# Patient Record
Sex: Male | Born: 1960 | Race: Black or African American | Hispanic: No | Marital: Single | Smoking: Current every day smoker
Health system: Southern US, Community
[De-identification: ages and names within clinical notes are randomized; demographics above are authoritative.]

## PROBLEM LIST (undated history)

## (undated) DIAGNOSIS — K5792 Diverticulitis of intestine, part unspecified, without perforation or abscess without bleeding: Secondary | ICD-10-CM

## (undated) DIAGNOSIS — J189 Pneumonia, unspecified organism: Secondary | ICD-10-CM

## (undated) DIAGNOSIS — I509 Heart failure, unspecified: Secondary | ICD-10-CM

## (undated) DIAGNOSIS — Z992 Dependence on renal dialysis: Secondary | ICD-10-CM

## (undated) DIAGNOSIS — D638 Anemia in other chronic diseases classified elsewhere: Secondary | ICD-10-CM

## (undated) DIAGNOSIS — N289 Disorder of kidney and ureter, unspecified: Secondary | ICD-10-CM

## (undated) DIAGNOSIS — F1910 Other psychoactive substance abuse, uncomplicated: Secondary | ICD-10-CM

## (undated) DIAGNOSIS — Z9289 Personal history of other medical treatment: Secondary | ICD-10-CM

## (undated) DIAGNOSIS — I10 Essential (primary) hypertension: Secondary | ICD-10-CM

## (undated) DIAGNOSIS — N186 End stage renal disease: Secondary | ICD-10-CM

## (undated) DIAGNOSIS — G479 Sleep disorder, unspecified: Secondary | ICD-10-CM

## (undated) DIAGNOSIS — N2581 Secondary hyperparathyroidism of renal origin: Secondary | ICD-10-CM

## (undated) DIAGNOSIS — Z720 Tobacco use: Secondary | ICD-10-CM

## (undated) HISTORY — PX: APPENDECTOMY: SHX54

## (undated) HISTORY — DX: Sleep disorder, unspecified: G47.9

## (undated) HISTORY — PX: OTHER SURGICAL HISTORY: SHX169

---

## 2006-06-11 ENCOUNTER — Emergency Department: Payer: Self-pay | Admitting: Emergency Medicine

## 2006-06-11 ENCOUNTER — Other Ambulatory Visit: Payer: Self-pay

## 2007-10-18 IMAGING — CR DG CHEST 1V
1 series · 1 of 1 positions shown · non-contrast
Comparison: none

REASON FOR EXAM: rm 10  abdominal  pain
COMMENTS:

[view not recorded]
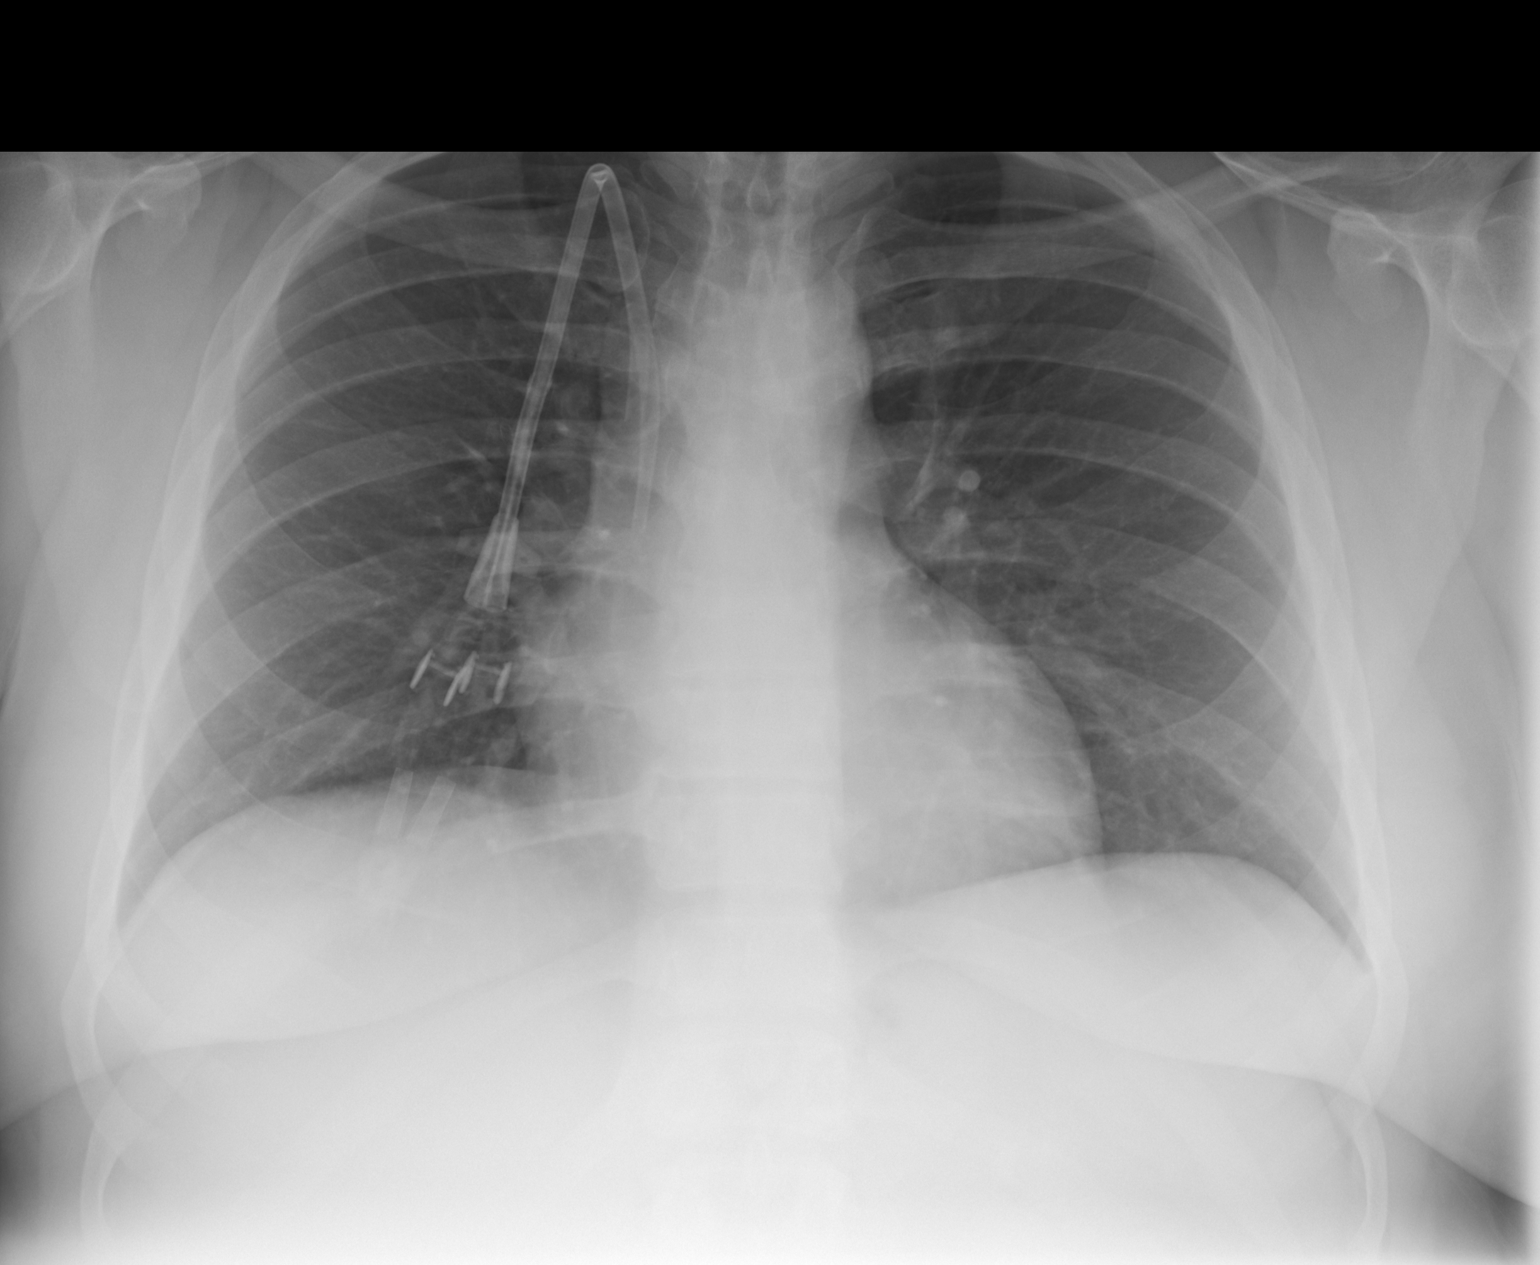

[1 of 1 positions shown; findings below may reference images not displayed]

PROCEDURE:     DXR - DXR CHEST 1 VIEWAP OR PA  - June 11, 2006  [DATE]

RESULT:     A venous catheter is present with the tip projected near the
junction of the superior vena cava and RIGHT atrium.  The lung fields are
clear. The heart, mediastinal and osseous structures show no significant
abnormalities.
IMPRESSION: 1)No acute changes are identified.

## 2007-10-18 IMAGING — CR DG ABDOMEN 1V
1 series · 2 of 2 positions shown · non-contrast
Comparison: none

REASON FOR EXAM: Abdominal pain
COMMENTS:

[Series 1: view not recorded · 0.17mm/px · 2 of 2 slices shown]
[im 1/2]
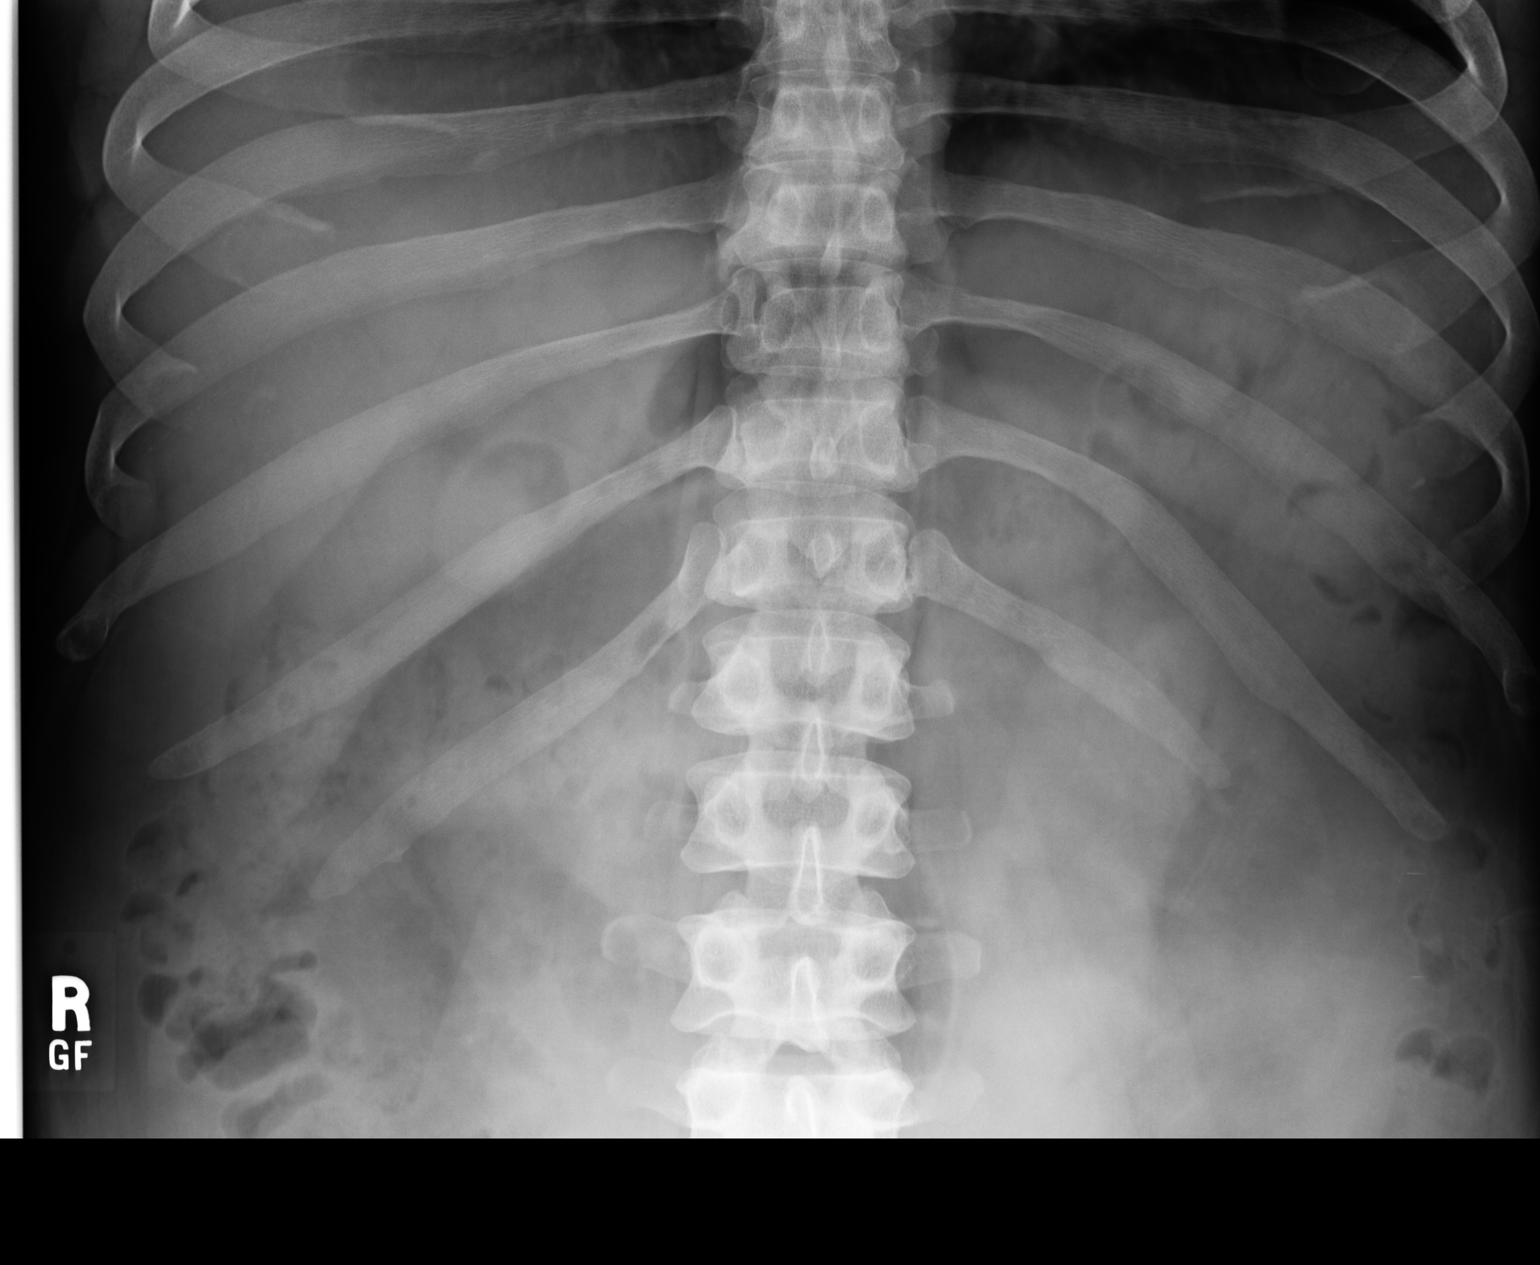
[im 2/2]
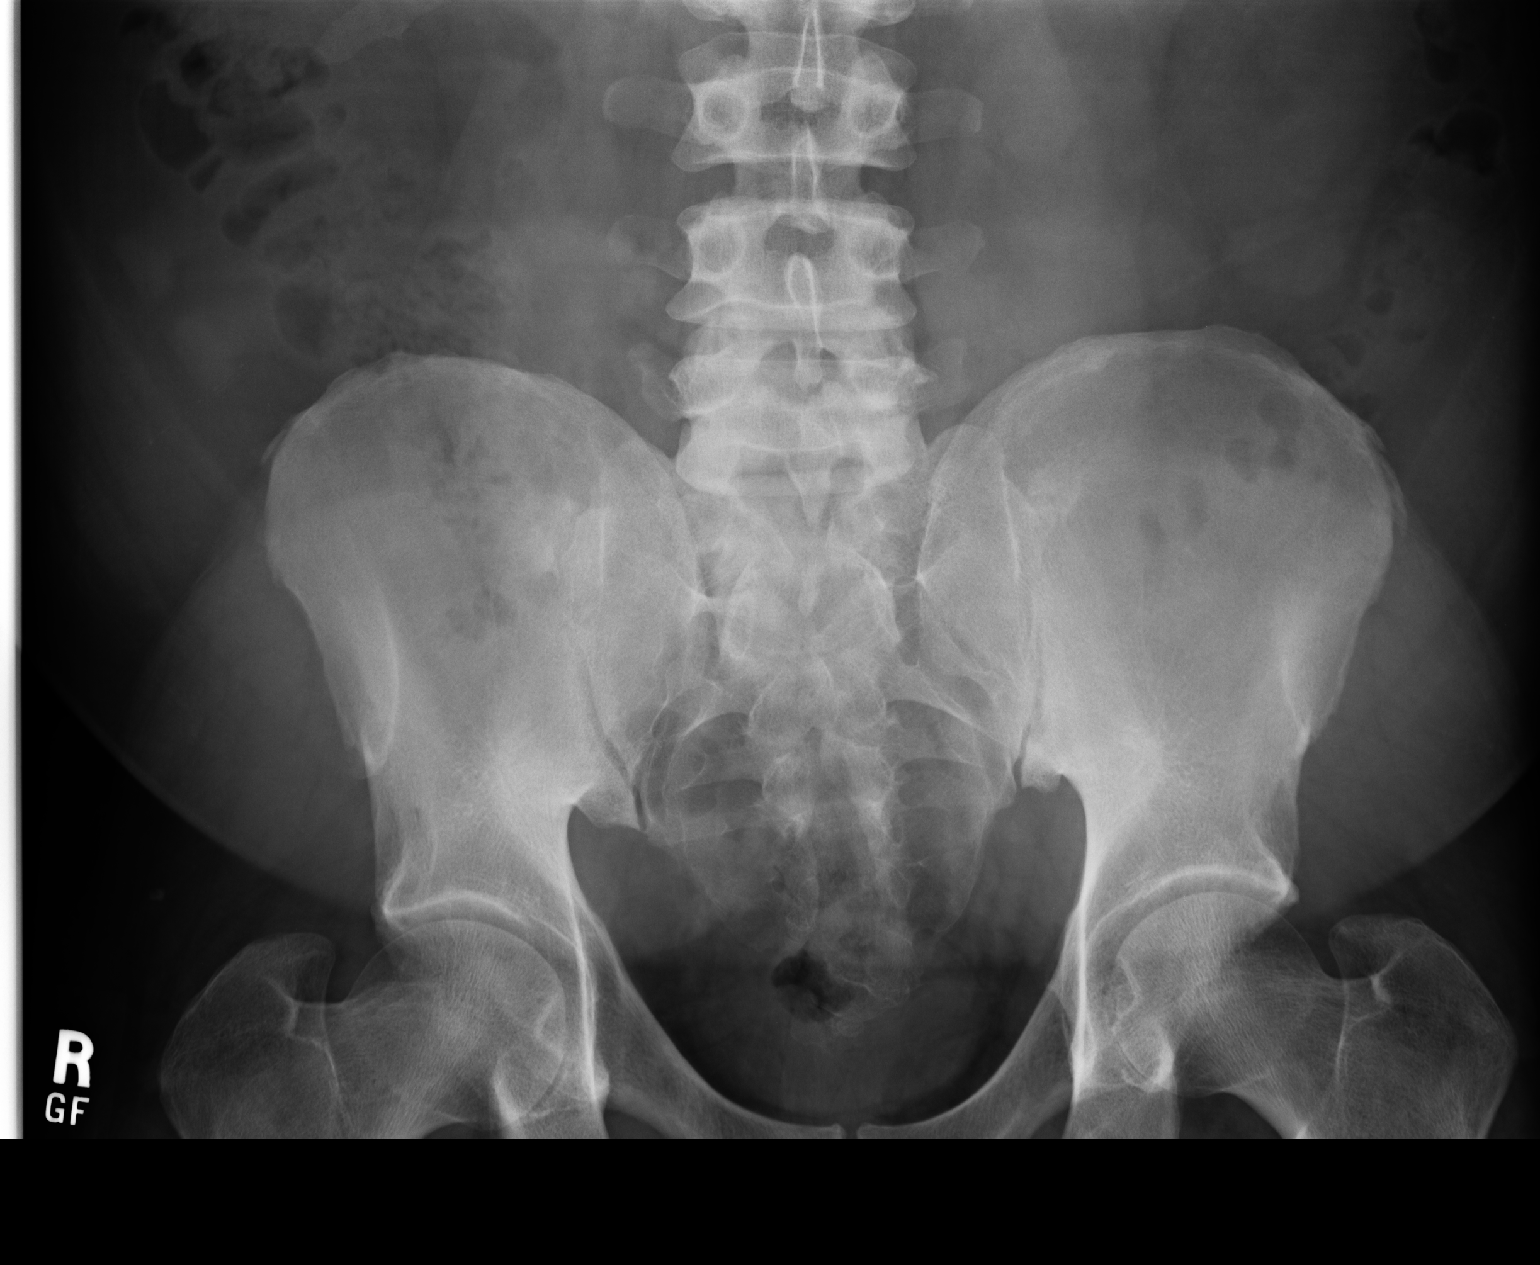

[2 of 2 positions shown; findings below may reference images not displayed]

PROCEDURE:     DXR - DXR KIDNEY URETER BLADDER  - June 11, 2006  [DATE]

RESULT:       AP view of the abdomen shows no specific abnormalities of the
bowel gas pattern.  No evidence for bowel obstruction is seen. There is
noted a moderate amount of fecal material in the colon.  No abnormal
intra-abdominal calcifications are seen.  The osseous structures are normal
in appearance.
IMPRESSION: 1.     No specific abnormalities are identified.
2.     There is a moderate amount of fecal material in the colon.

## 2008-08-22 ENCOUNTER — Other Ambulatory Visit: Payer: Self-pay

## 2008-08-22 ENCOUNTER — Emergency Department: Payer: Self-pay | Admitting: Emergency Medicine

## 2008-08-25 ENCOUNTER — Other Ambulatory Visit: Payer: Self-pay

## 2008-08-25 ENCOUNTER — Inpatient Hospital Stay: Payer: Self-pay | Admitting: Internal Medicine

## 2009-03-19 ENCOUNTER — Emergency Department: Payer: Self-pay | Admitting: Emergency Medicine

## 2009-10-20 ENCOUNTER — Emergency Department: Payer: Self-pay | Admitting: Emergency Medicine

## 2009-10-20 ENCOUNTER — Inpatient Hospital Stay: Payer: Self-pay | Admitting: Internal Medicine

## 2010-01-01 IMAGING — CT CT ABD-PELV W/O CM
1 of 2 series · 15 of 32 positions shown, 19 images · non-contrast
Comparison: none

REASON FOR EXAM: umbilical pain, hernia, dialysis pt
COMMENTS:

PROCEDURE:     CT  - CT ABDOMEN AND PELVIS W[DATE] [DATE]
RESULT:     Indication: Renal stone.
TECHNIQUE: A standard renal stone protocol was performed with sequential 5
mm noncontrasted axial images from the lung bases to the symphysis pubis
with the patient in a supine position.

[Series 2: soft tissue · axial · 0.75mm/px · z∈[-49,+416]mm · 15 of 103 slices shown, 19 images]
[im 5/103  soft-tissue]
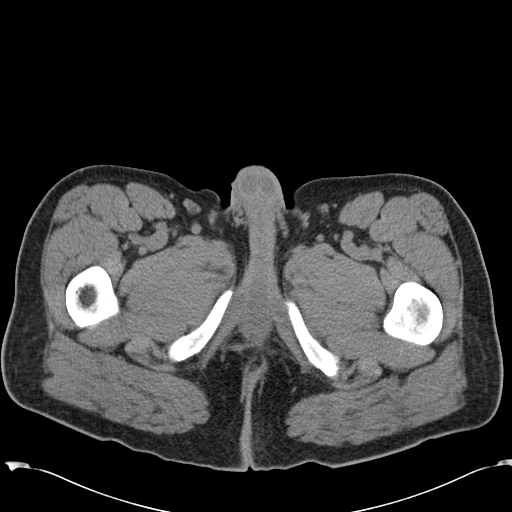
[im 5/103  bone]
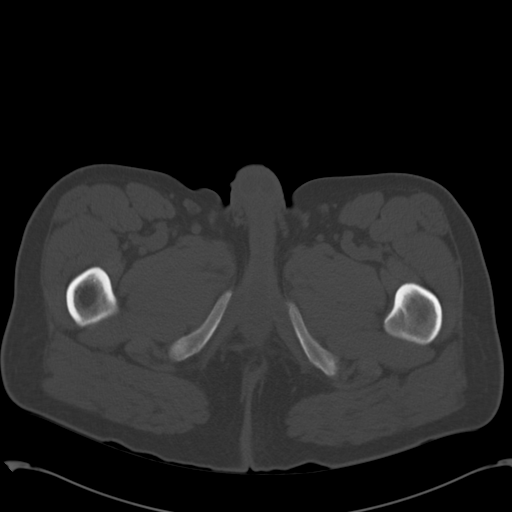
[im 13/103  soft-tissue]
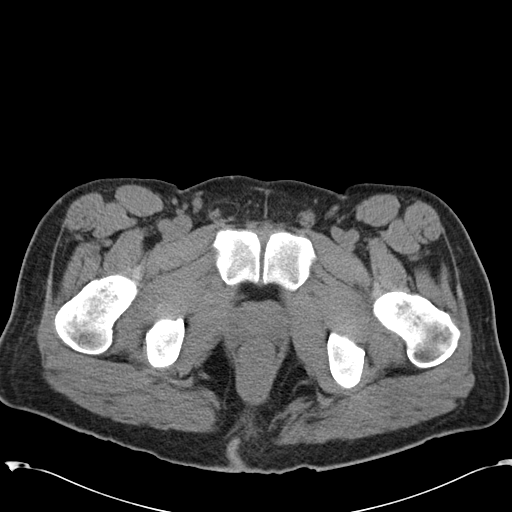
[im 21/103  soft-tissue]
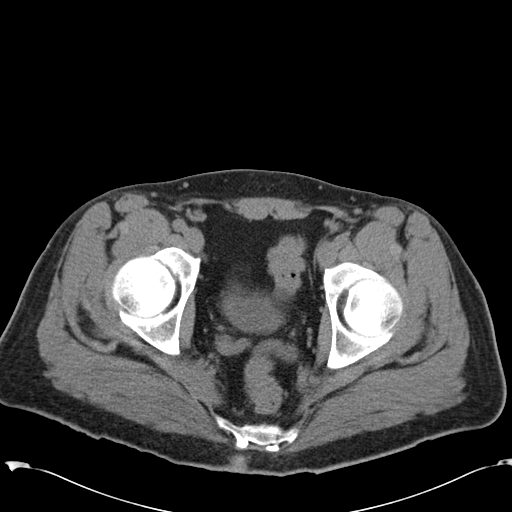
[im 29/103  soft-tissue]
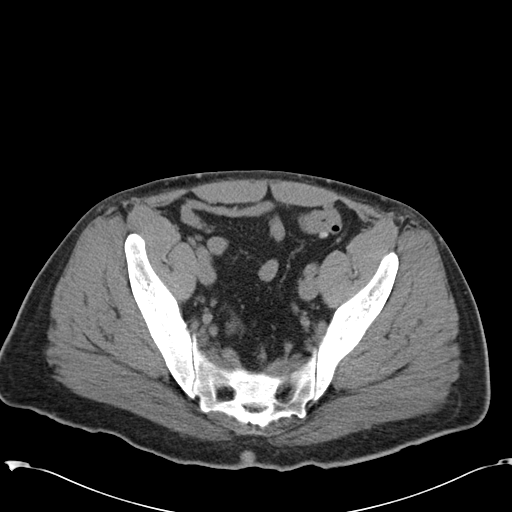
[im 37/103  soft-tissue]
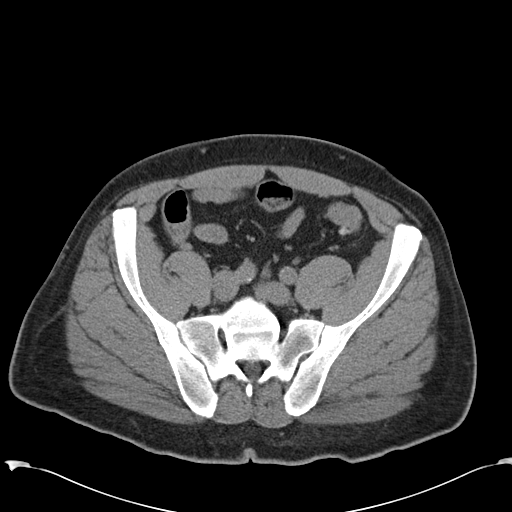
[im 45/103  soft-tissue]
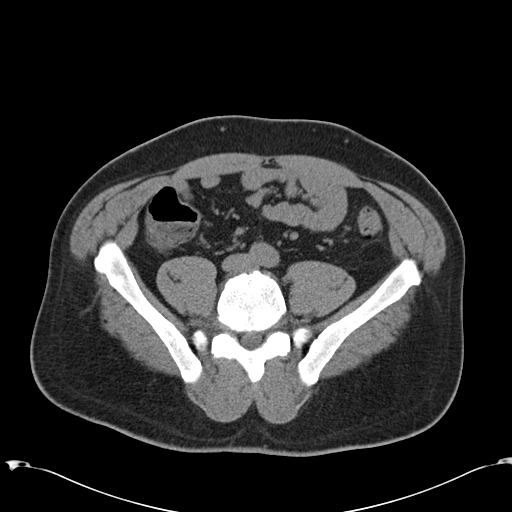
[im 54/103  soft-tissue]
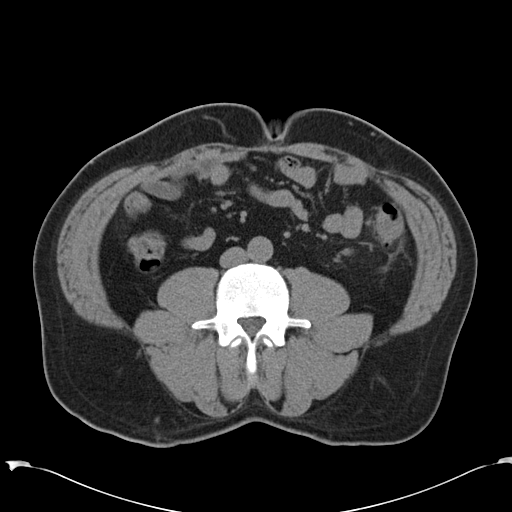
[im 58/103  soft-tissue]
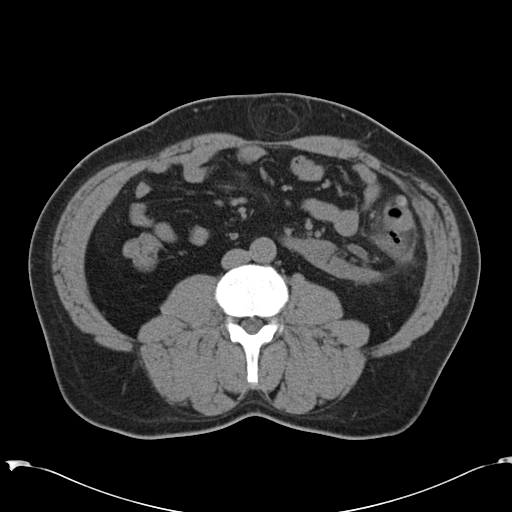
[im 66/103  soft-tissue]
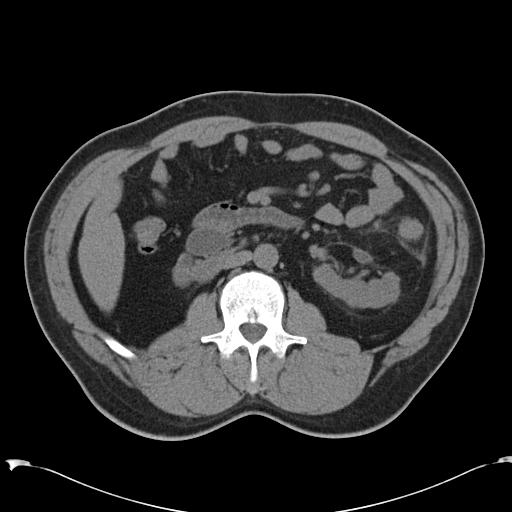
[im 66/103  bone]
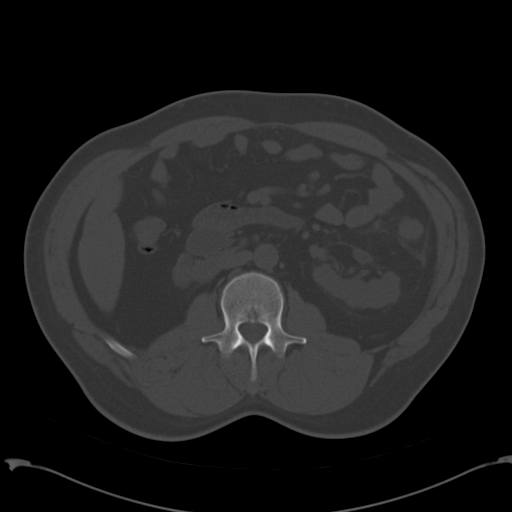
[im 74/103  soft-tissue]
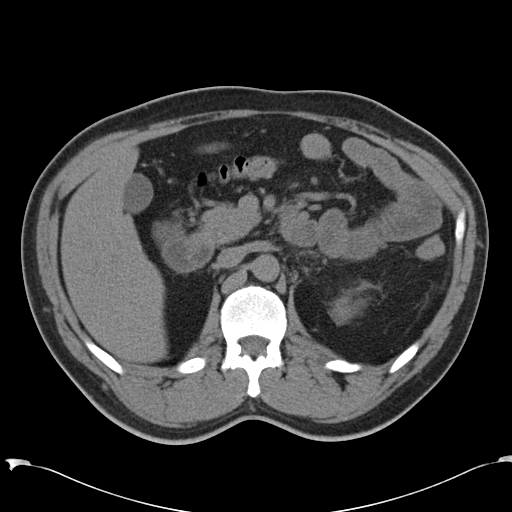
[im 82/103  soft-tissue]
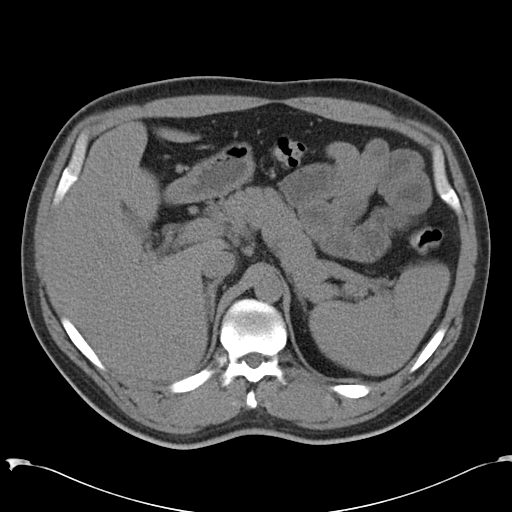
[im 86/103  lung]
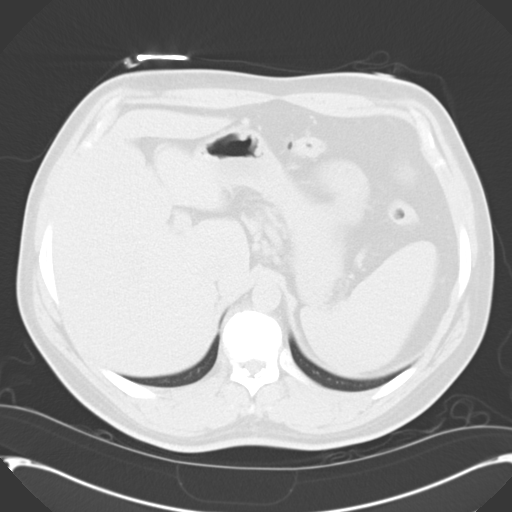
[im 90/103  soft-tissue]
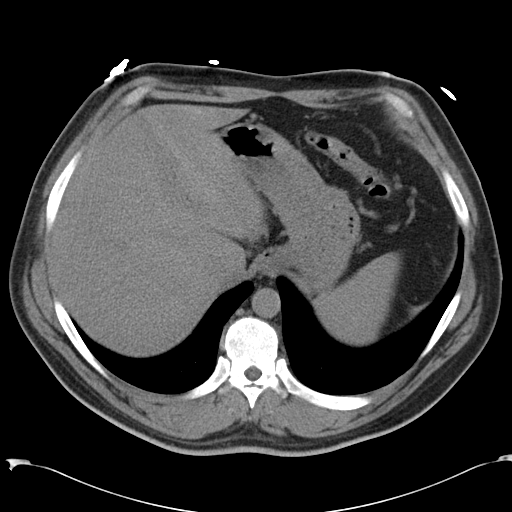
[im 90/103  lung]
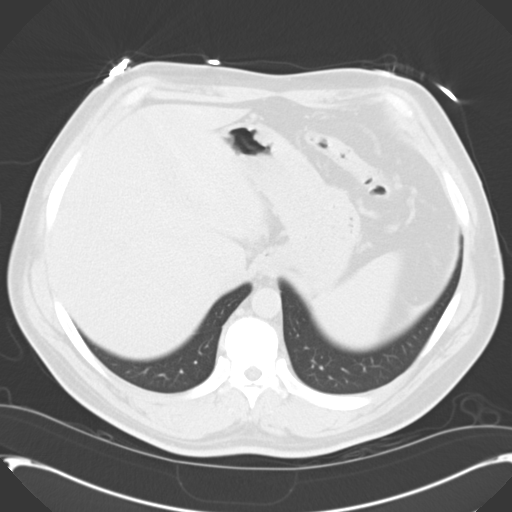
[im 94/103  lung]
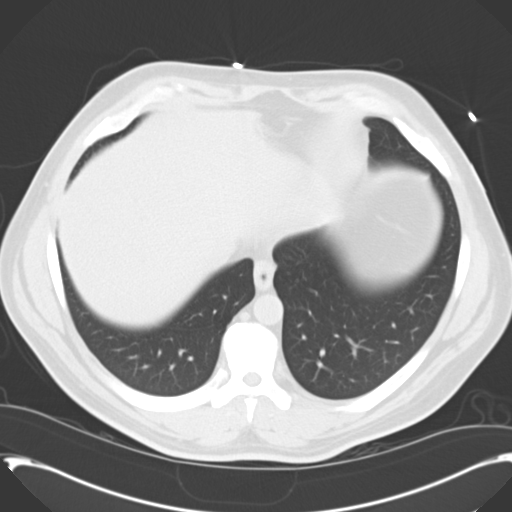
[im 98/103  soft-tissue]
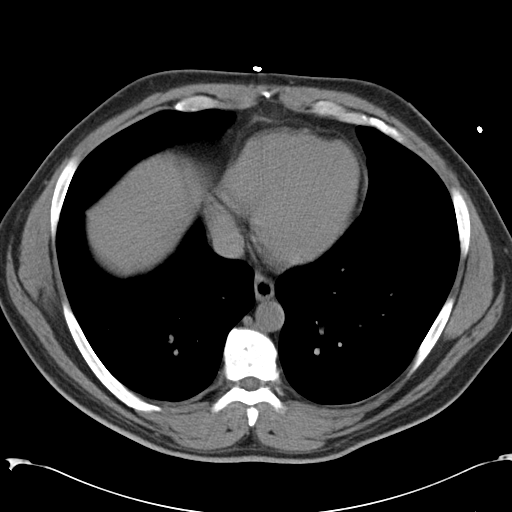
[im 98/103  lung]
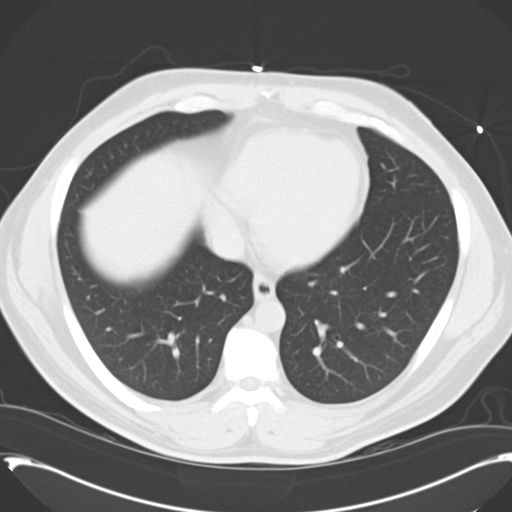

[15 of 32 positions shown; findings below may reference images not displayed]

FINDINGS: Limited views of the lung bases fail to demonstrate abnormal parenchymal
nodules or pleural or pericardial effusions.

No renal, ureteral, or bladder calculi. No obstructive uropathy.
There are no secondary signs of obstruction. No perinephric stranding is
seen. The kidneys are symmetric in size without evidence for exophytic mass.

The liver demonstrates no focal abnormality. The gallbladder is
unremarkable. The spleen demonstrates no focal abnormality. The adrenal
glands and pancreas are normal.

The unopacified stomach, duodenum, small intestine, and large intestine are
unremarkable, but evaluation is limited by lack of oral contrast. There are
scattered diverticula involving the descending colon. In the region of the
mid descending colon there is mild bowel wall thickening and pericolonic
inflammatory changes. There is a pericolonic collection of air measuring
cm which may represent a contained perforation. There is a ventral fat
containing abdominal wall hernia. There is no pneumoperitoneum, pneumatosis,
or portal venous gas. There is no abdominal or pelvic free fluid. There is
no lymphadenopathy.

The abdominal aorta is normal in caliber.

The osseous structures are unremarkable.
IMPRESSION: 1. Diverticulosis of the descending colon with a focal area of bowel wall
thickening and pericolonic inflammatory changes involving the mid descending
colon most consistent with diverticulitis. There is an adjacent pericolonic
collection of air measuring 1.3 cm likely representing a contained
perforation.

These findings were communicated to Dr. Dane on 08/25/2008 at [id number] hours.

## 2010-01-11 ENCOUNTER — Ambulatory Visit: Payer: Self-pay | Admitting: Internal Medicine

## 2010-07-14 ENCOUNTER — Emergency Department: Payer: Self-pay | Admitting: Emergency Medicine

## 2010-08-05 ENCOUNTER — Emergency Department: Payer: Self-pay | Admitting: Internal Medicine

## 2010-12-31 ENCOUNTER — Inpatient Hospital Stay: Payer: Self-pay | Admitting: Internal Medicine

## 2011-01-17 ENCOUNTER — Inpatient Hospital Stay: Payer: Self-pay | Admitting: Internal Medicine

## 2011-01-29 ENCOUNTER — Ambulatory Visit: Payer: Self-pay | Admitting: Vascular Surgery

## 2011-02-16 ENCOUNTER — Inpatient Hospital Stay: Payer: Self-pay | Admitting: Internal Medicine

## 2011-02-17 DIAGNOSIS — R0602 Shortness of breath: Secondary | ICD-10-CM

## 2011-02-26 IMAGING — CR DG CHEST 1V PORT
1 series · 1 of 1 positions shown · non-contrast
Comparison: none

REASON FOR EXAM: Chest Pain
COMMENTS:

[view not recorded]
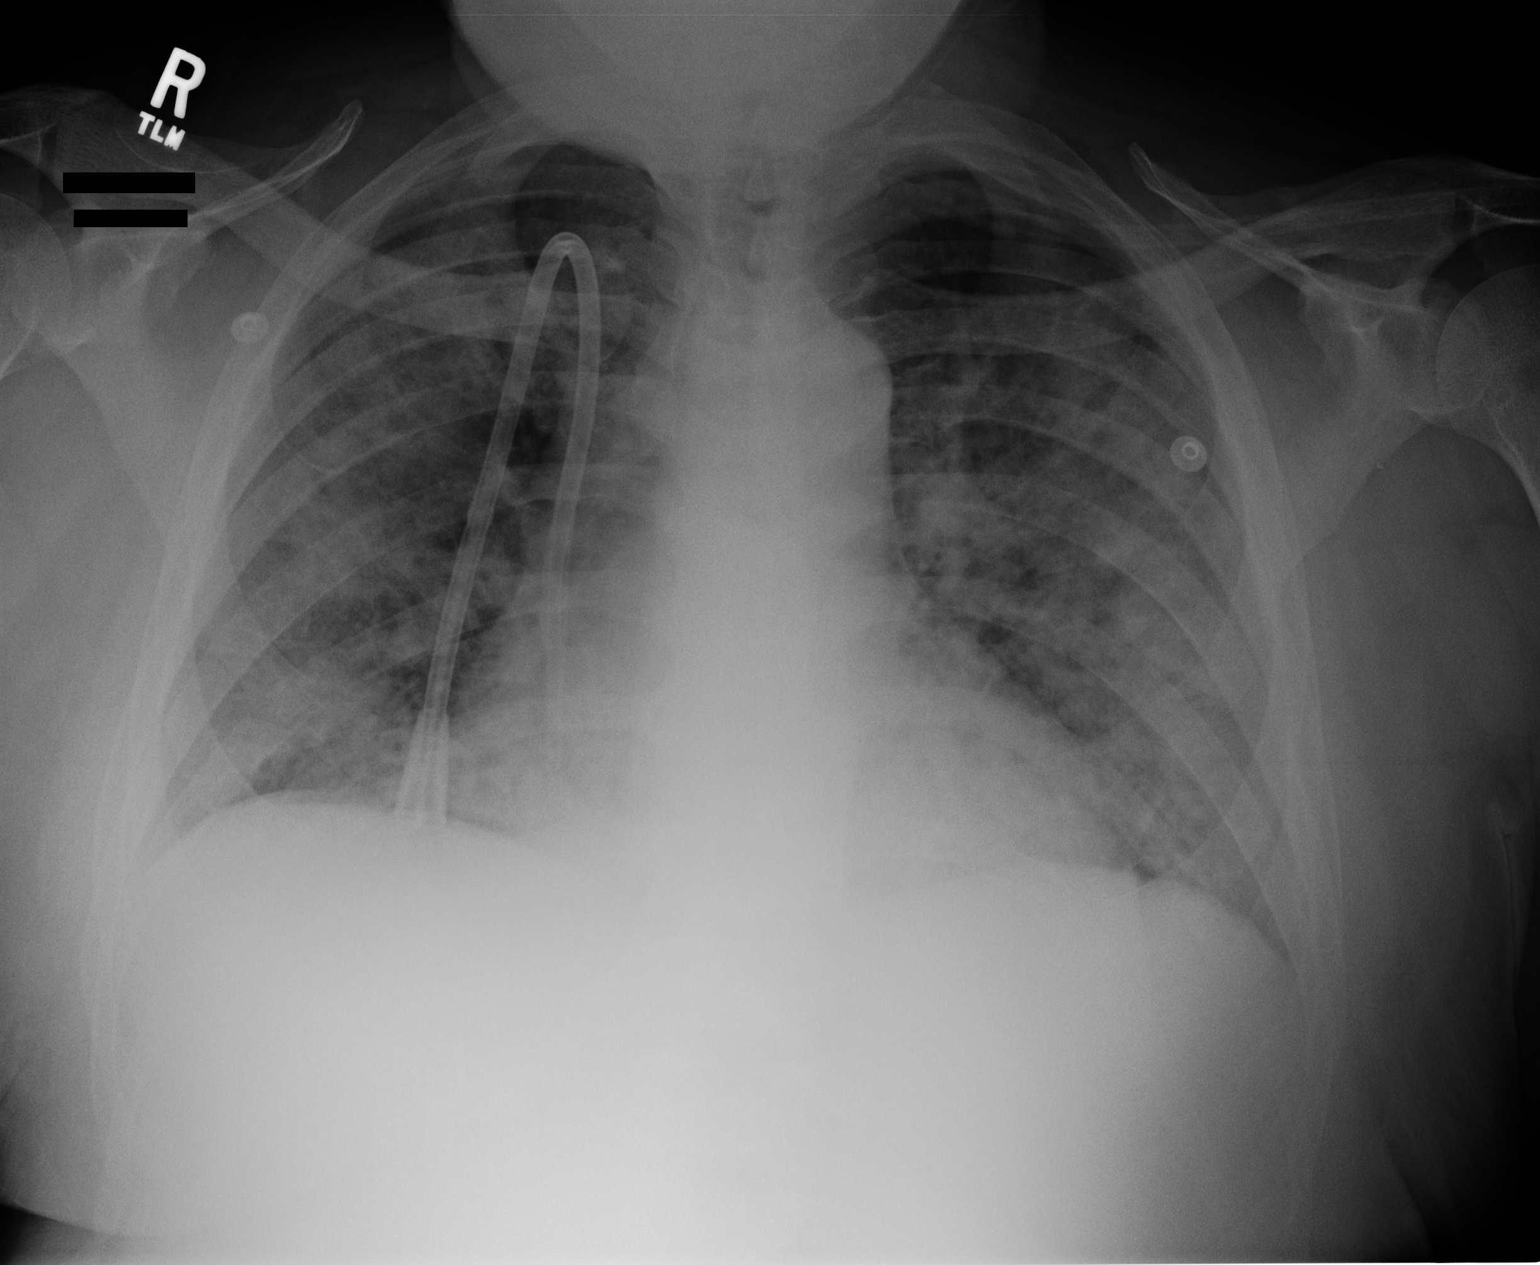

[1 of 1 positions shown; findings below may reference images not displayed]

PROCEDURE:     DXR - DXR PORTABLE CHEST SINGLE VIEW  - October 20, 2009  [DATE]

RESULT:     Comparison is made to a prior exam of 06/11/2006. A dual lumen
venous catheter is present with the tip projected over the region of the
right atrium. There is noted increased interstitial and alveolar density in
both lungs. The findings are nonspecific and could be secondary to edema or
to bilateral pneumonia. No pleural effusion is seen. The heart is mildly
enlarged. No acute bony abnormalities are identified.
IMPRESSION: 1. Diffuse bilateral interstitial and alveolar infiltrative changes are
noted bilaterally. Pulmonary edema would be the first consideration but
pneumonia cannot be entirely excluded from the differential radiographically
at this time.
2. Cardiomegaly.

## 2011-03-05 ENCOUNTER — Emergency Department: Payer: Self-pay | Admitting: Unknown Physician Specialty

## 2011-03-10 ENCOUNTER — Emergency Department: Payer: Self-pay | Admitting: Emergency Medicine

## 2011-04-12 ENCOUNTER — Ambulatory Visit: Payer: Self-pay | Admitting: Orthopedic Surgery

## 2011-05-20 IMAGING — CR DG CHEST 2V
1 series · 2 of 2 positions shown · non-contrast
Comparison: none

REASON FOR EXAM: SOB
COMMENTS:

[Series 1: view not recorded · 0.17mm/px · 2 of 2 slices shown]
[im 1/2]
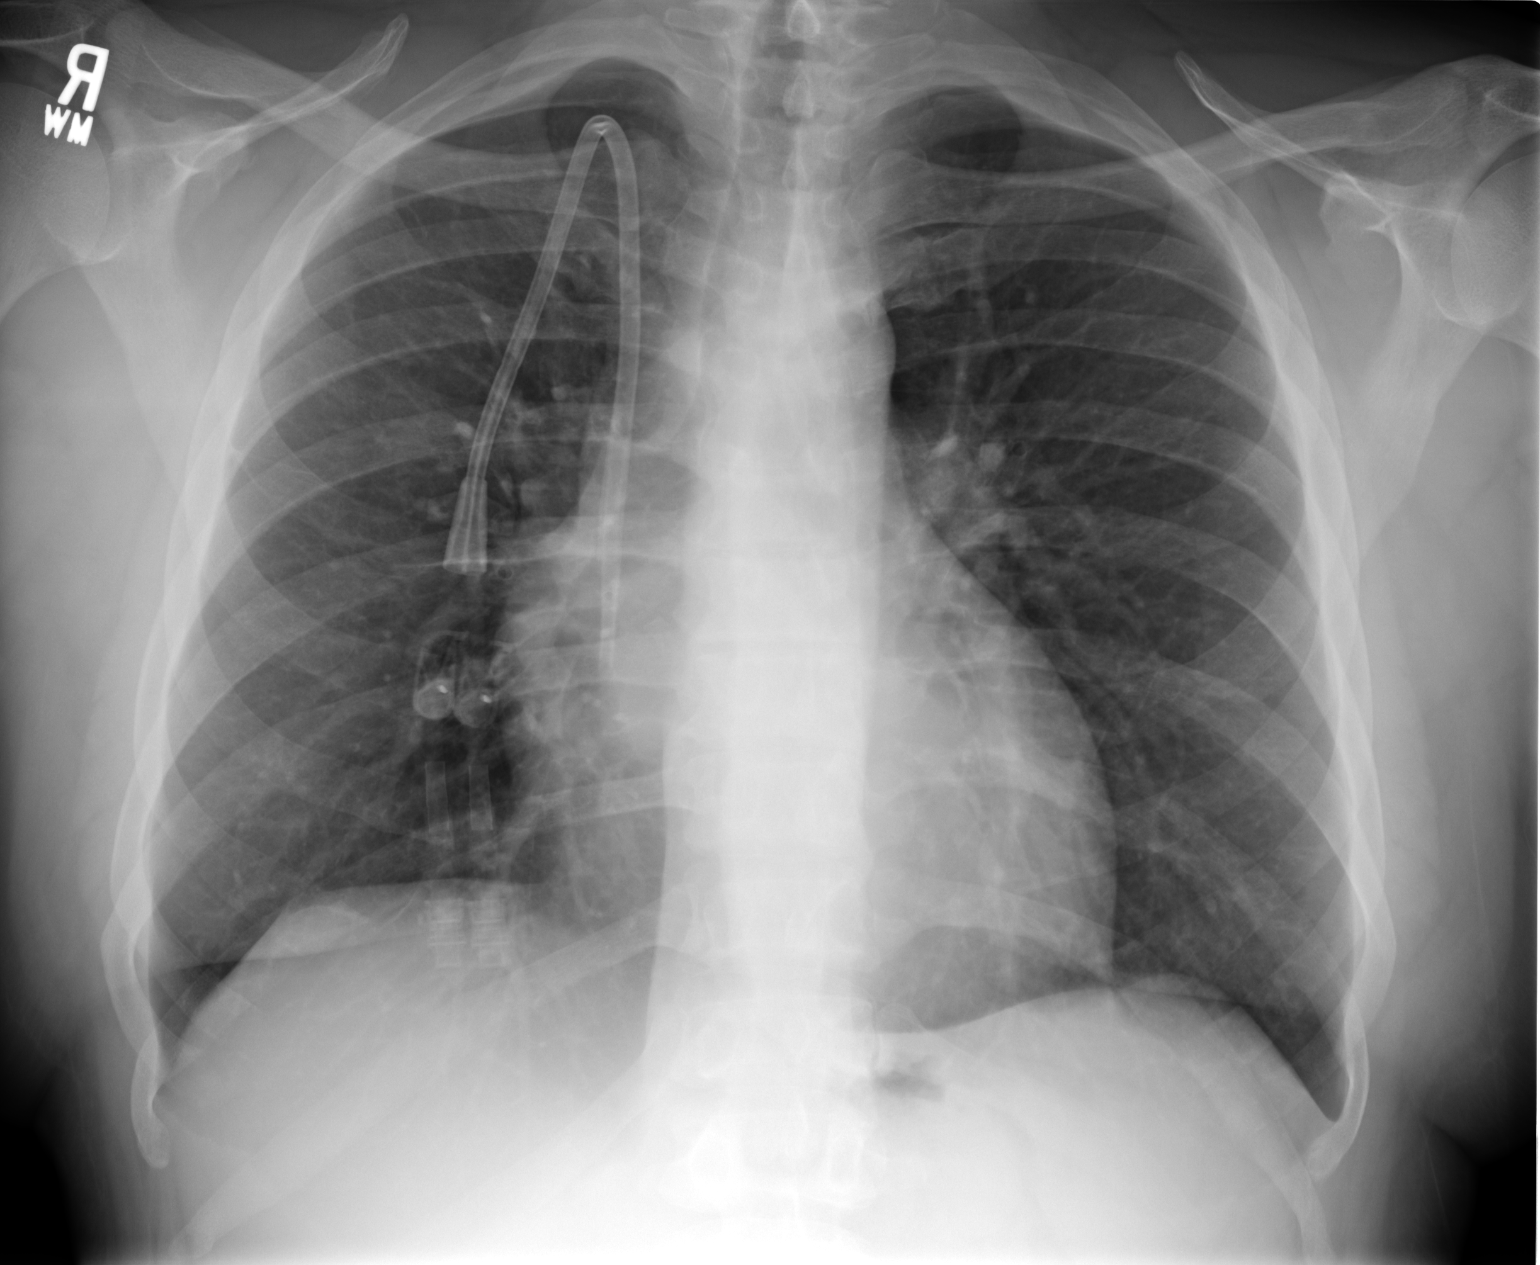
[im 2/2]
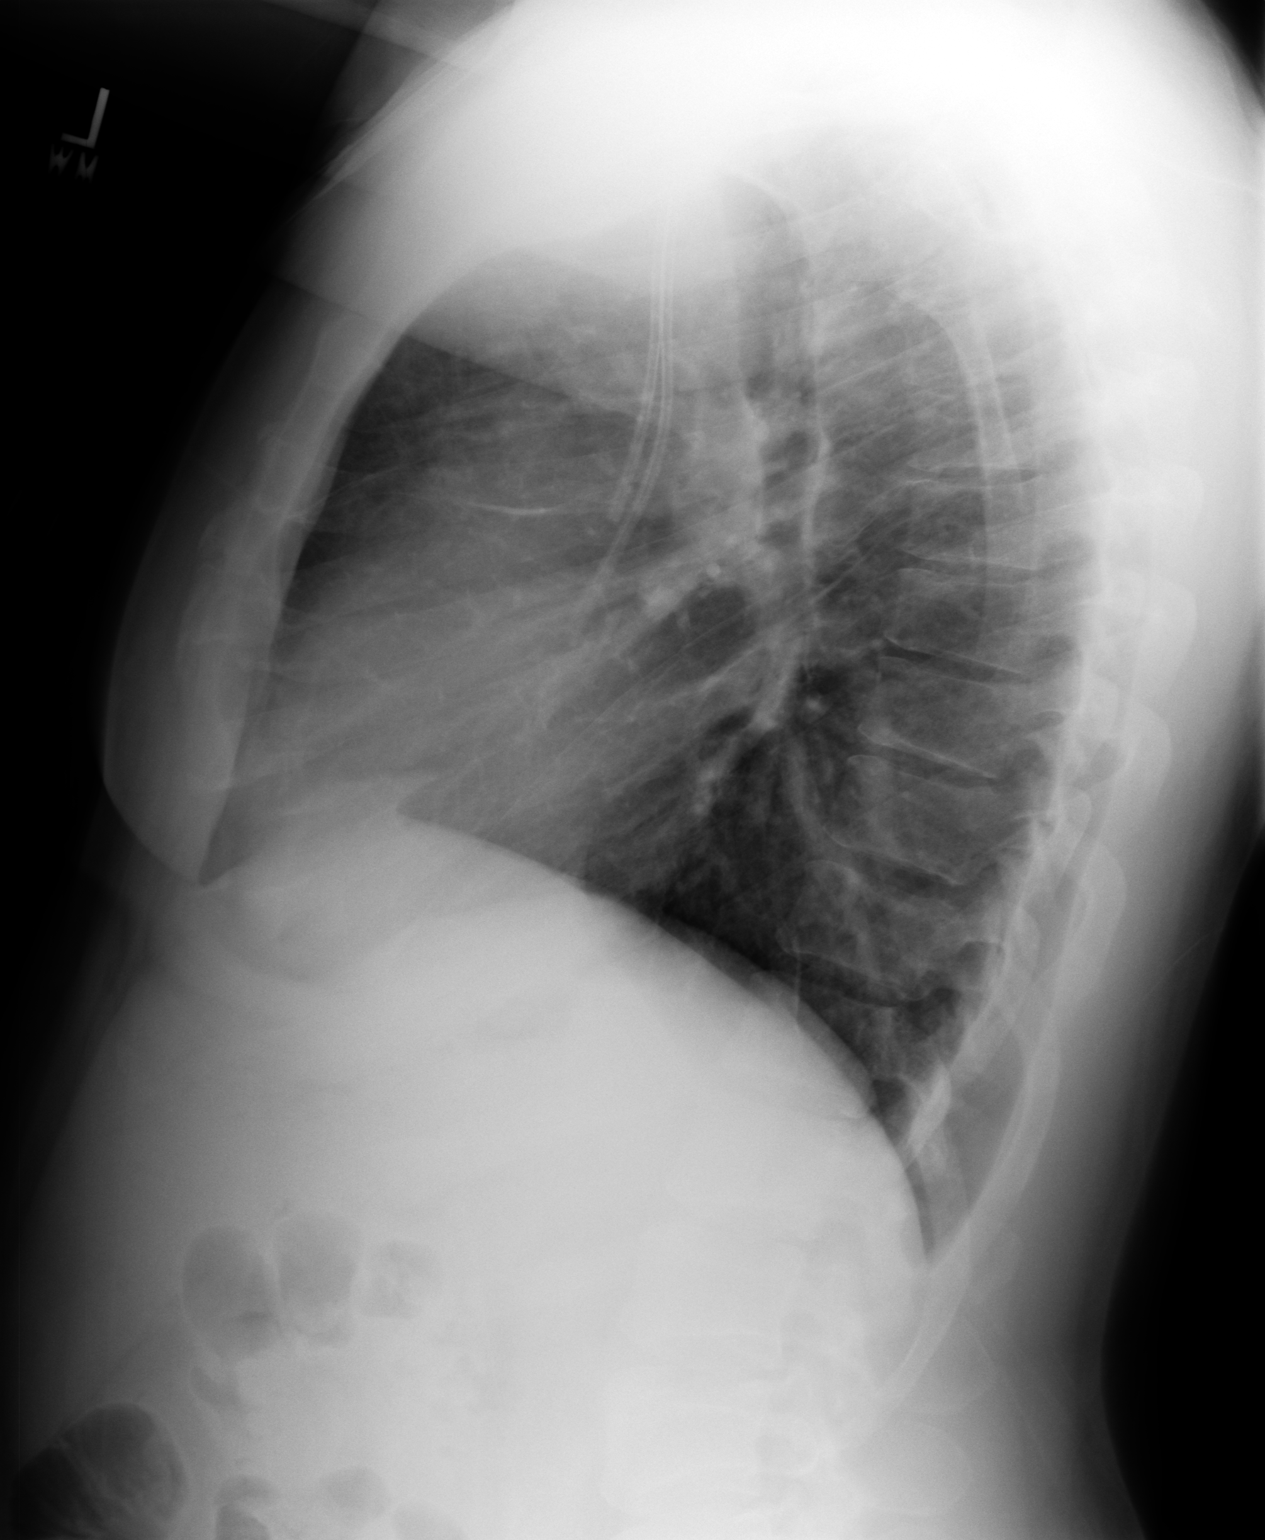

[2 of 2 positions shown; findings below may reference images not displayed]

PROCEDURE:     DXR - DXR CHEST PA (OR AP) AND LATERAL  - January 11, 2010  [DATE]

RESULT:     There is a duel lumen central venous catheter present on the
right with the distal tip in the right atrium. When compared to the study of
10/20/2009 there is significantly improved aeration with decreased
interstitial edema. There is no effusion, pneumothorax or focal infiltrate.
The heart is borderline enlarged.
IMPRESSION: No definite acute cardiopulmonary disease.

## 2011-06-20 ENCOUNTER — Inpatient Hospital Stay: Payer: Self-pay | Admitting: Internal Medicine

## 2011-07-27 ENCOUNTER — Ambulatory Visit: Payer: Self-pay | Admitting: Internal Medicine

## 2011-09-04 ENCOUNTER — Ambulatory Visit: Payer: Self-pay | Admitting: Internal Medicine

## 2012-03-10 ENCOUNTER — Emergency Department: Payer: Self-pay | Admitting: Emergency Medicine

## 2012-03-10 LAB — CBC
HCT: 33 % — ABNORMAL LOW (ref 40.0–52.0)
HGB: 10.4 g/dL — ABNORMAL LOW (ref 13.0–18.0)
MCHC: 31.4 g/dL — ABNORMAL LOW (ref 32.0–36.0)
MCV: 94 fL (ref 80–100)
Platelet: 163 10*3/uL (ref 150–440)
RDW: 16.4 % — ABNORMAL HIGH (ref 11.5–14.5)
WBC: 4.1 10*3/uL (ref 3.8–10.6)

## 2012-05-25 IMAGING — CR DG CHEST 2V
1 series · 2 of 2 positions shown · non-contrast
Comparison: none

REASON FOR EXAM: Shortness of Breath
COMMENTS:   May transport without cardiac monitor

[Series 1: view not recorded · 0.17mm/px · 2 of 2 slices shown]
[im 1/2]
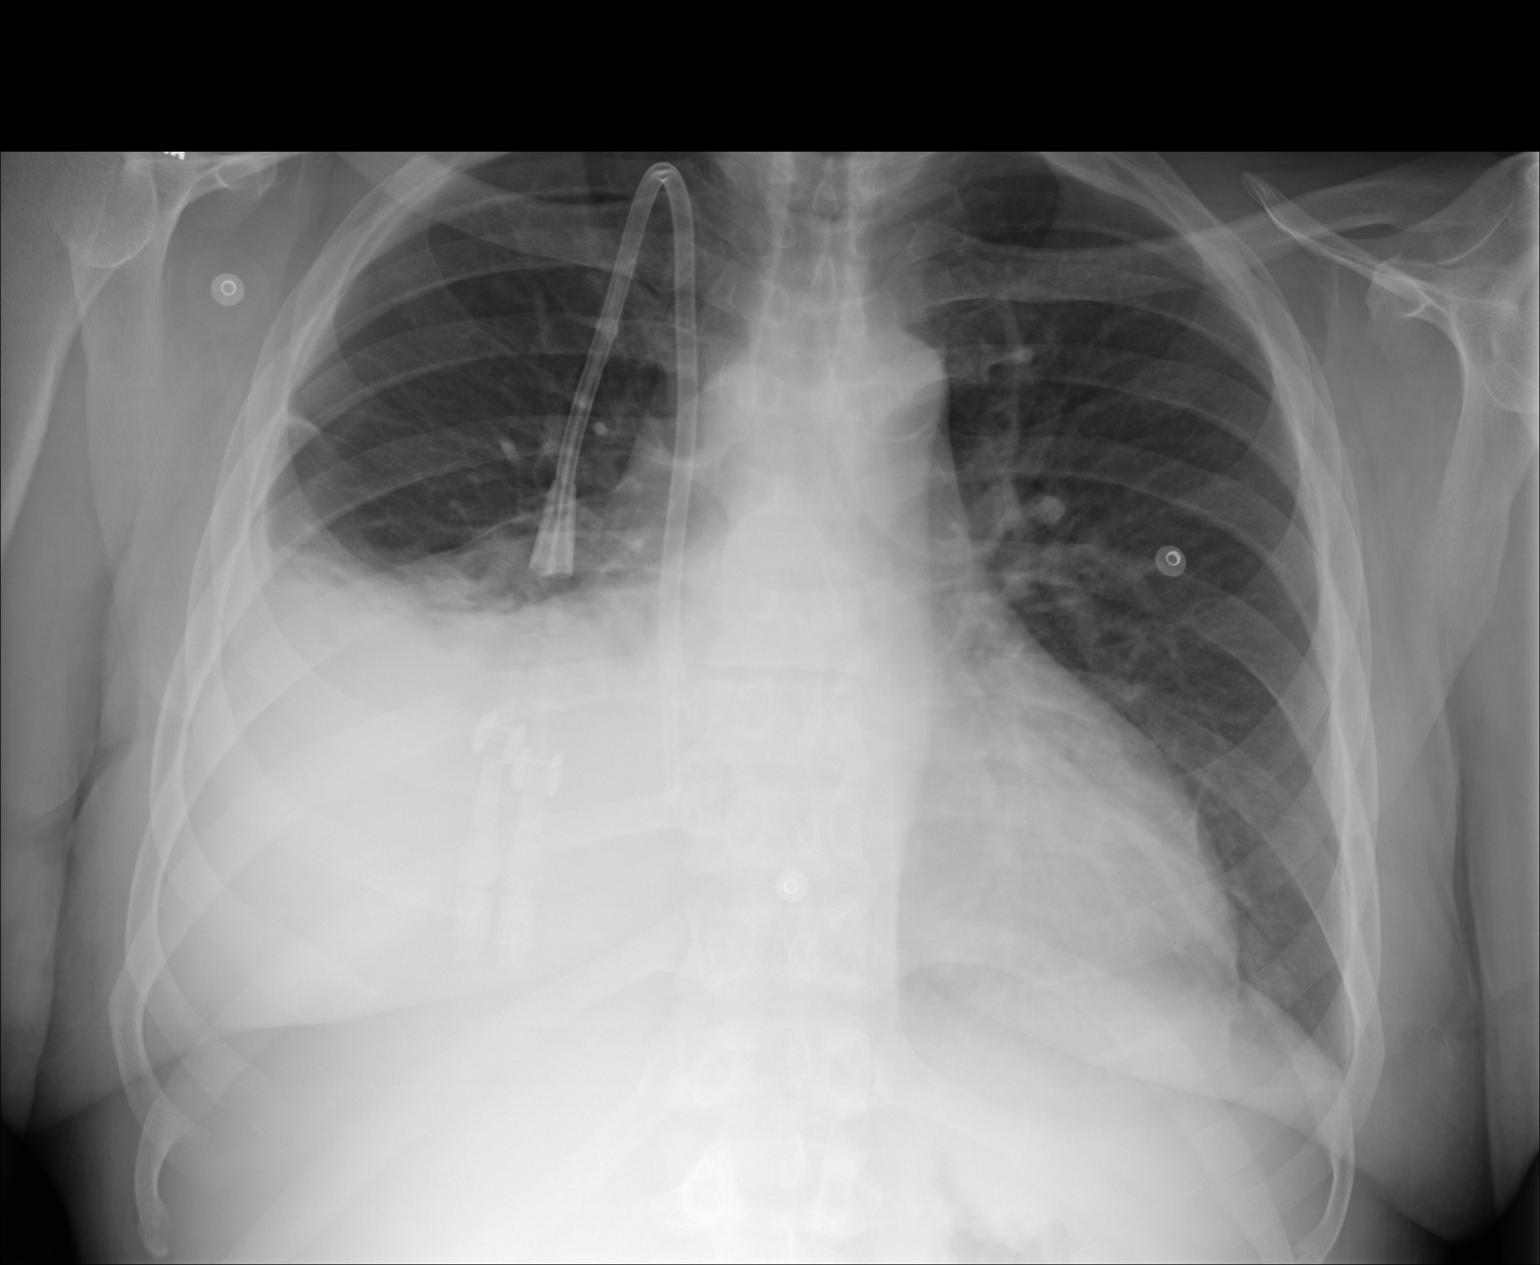
[im 2/2]
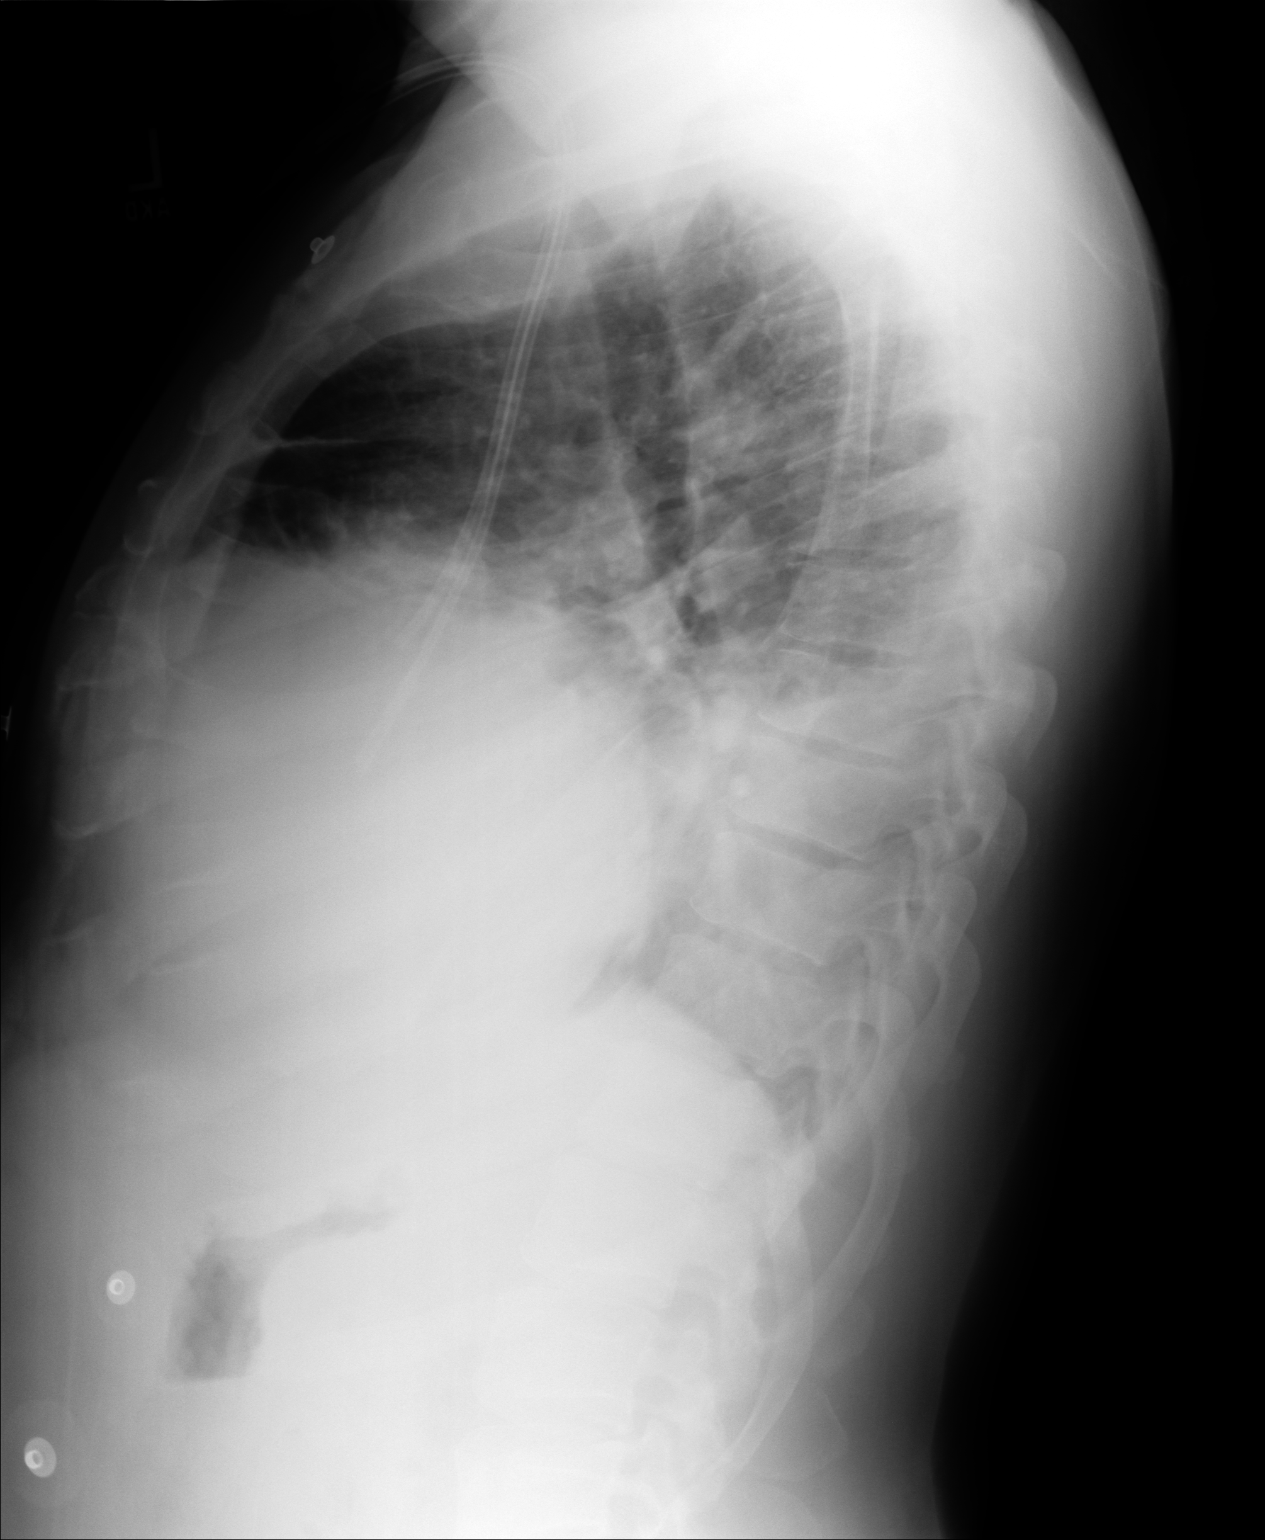

[2 of 2 positions shown; findings below may reference images not displayed]

PROCEDURE:     DXR - DXR CHEST PA (OR AP) AND LATERAL  - January 17, 2011  [DATE]

RESULT:     Comparison is made to the study of 12/31/2010.

A dual lumen central venous catheter is present on the right. There is a
large right pleural effusion persisting with decreasing interstitial edema
compared to the previous exam. The heart appears to remain enlarged. The
left lung is clear.
IMPRESSION: Decreasing interstitial edema with a large right pleural
effusion. The distal tip of the central venous catheter is in the right
atrium.

## 2012-05-26 IMAGING — CT CT CHEST W/O CM
1 series · 15 of 31 positions shown, 19 images · non-contrast
Comparison: none

REASON FOR EXAM: large pleural effusion, r/o mass- patient refused
contrast
COMMENTS:

[Series 2: soft tissue · axial · 0.79mm/px · z∈[-1240,-940]mm · 15 of 66 slices shown, 19 images]
[im 3/66  mediastinal]
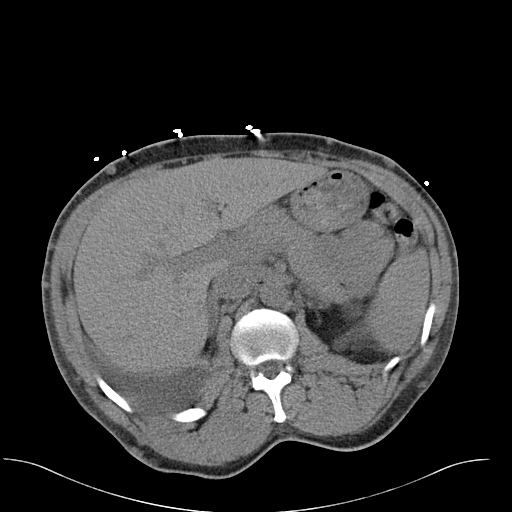
[im 3/66  lung]
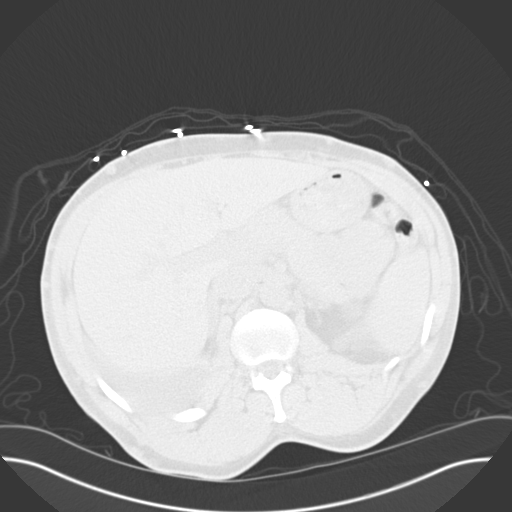
[im 8/66  lung]
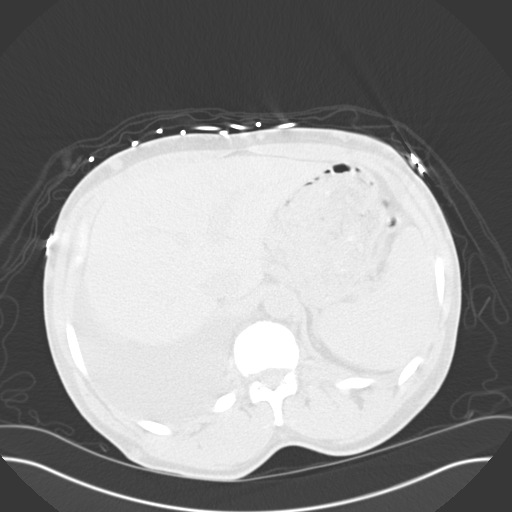
[im 13/66  lung]
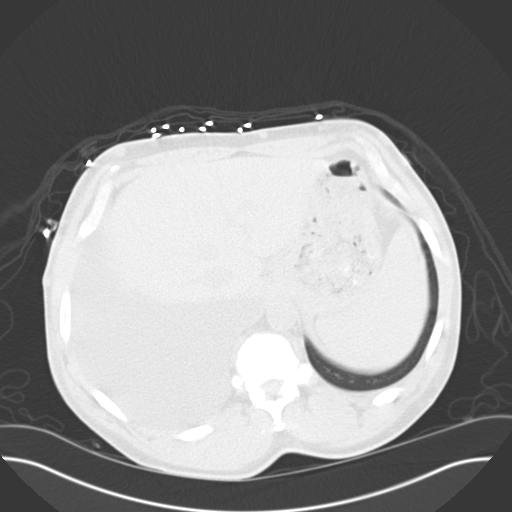
[im 15/66  lung]
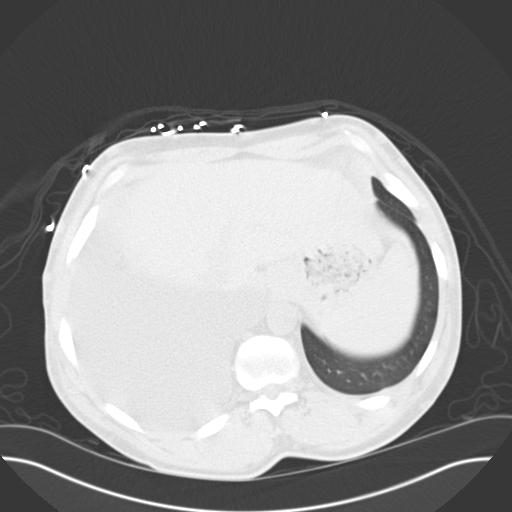
[im 20/66  mediastinal]
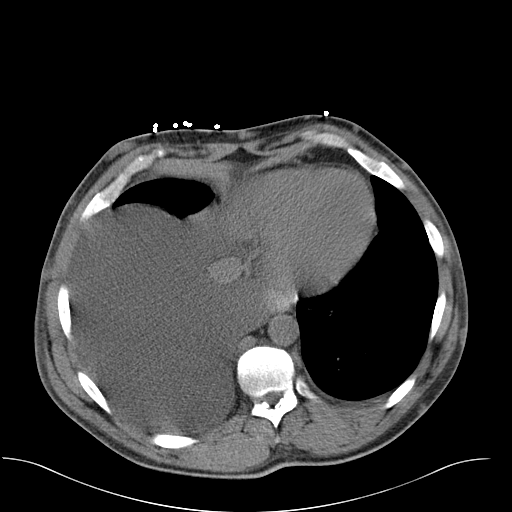
[im 20/66  lung]
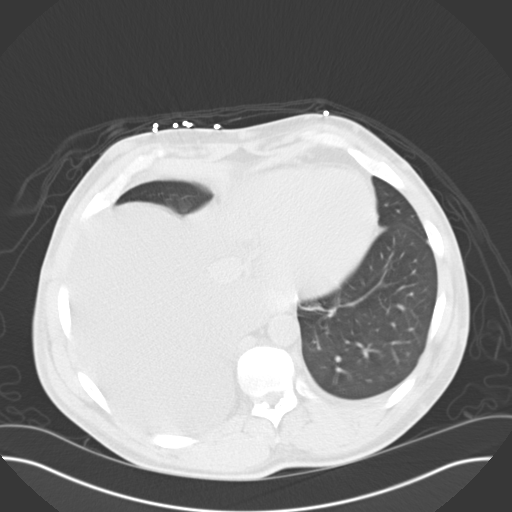
[im 25/66  lung]
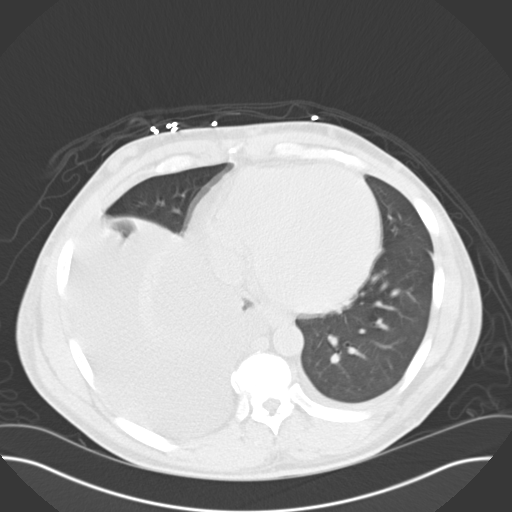
[im 29/66  lung]
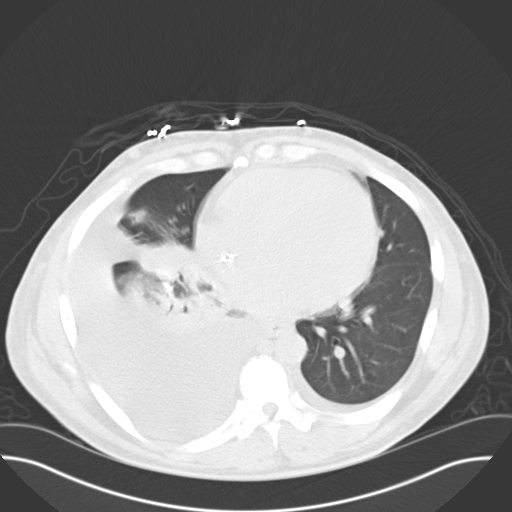
[im 34/66  lung]
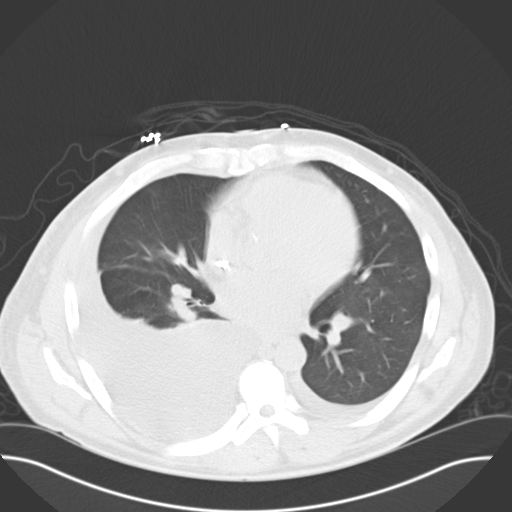
[im 37/66  mediastinal]
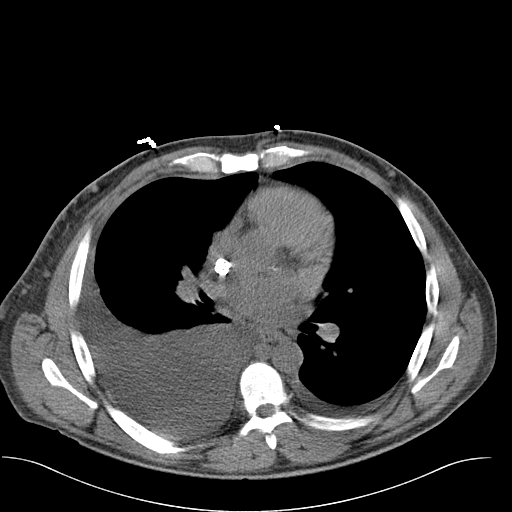
[im 37/66  lung]
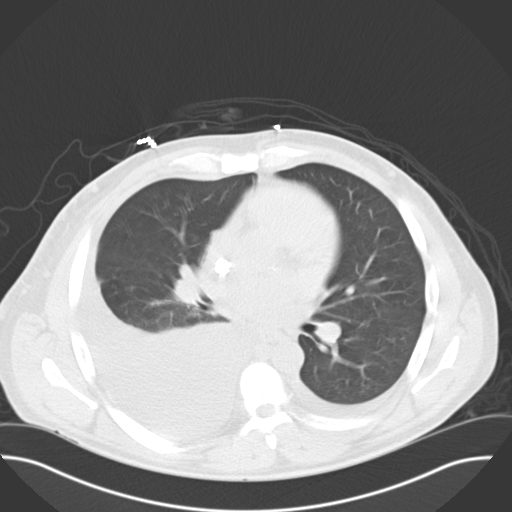
[im 40/66  lung]
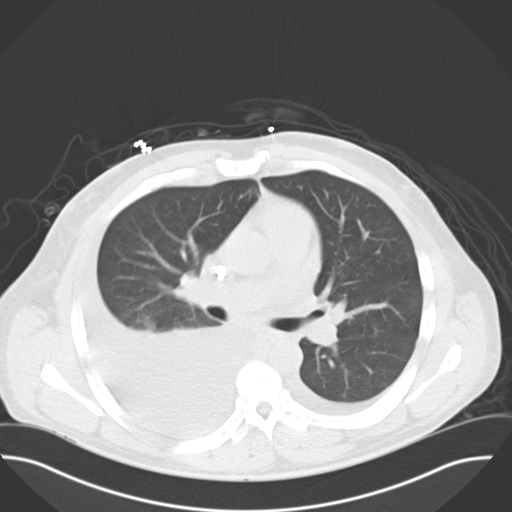
[im 44/66  lung]
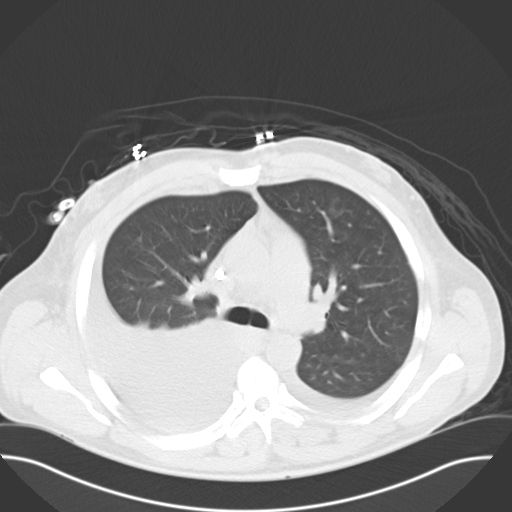
[im 49/66  lung]
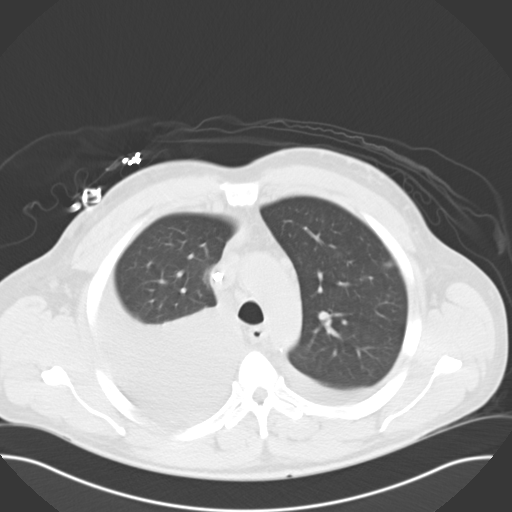
[im 53/66  mediastinal]
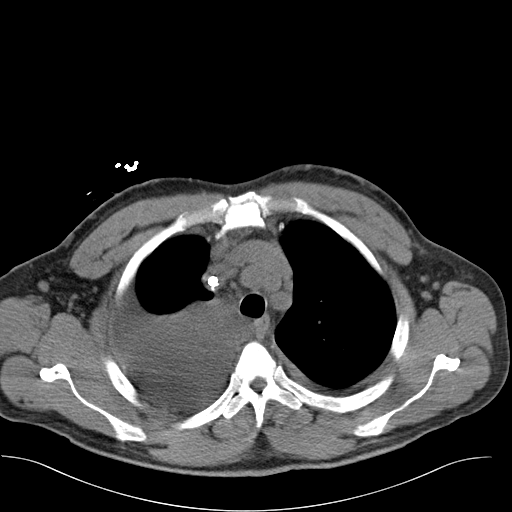
[im 53/66  lung]
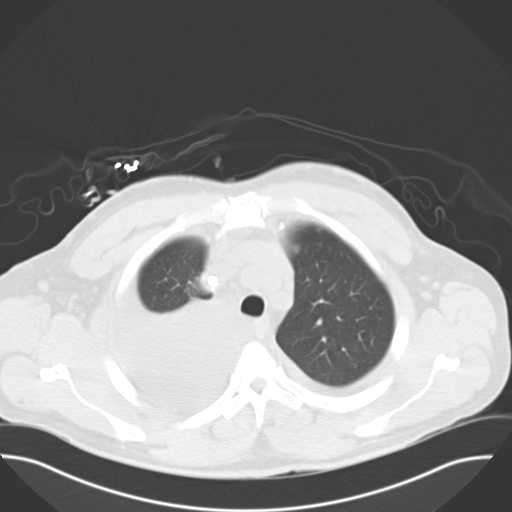
[im 58/66  lung]
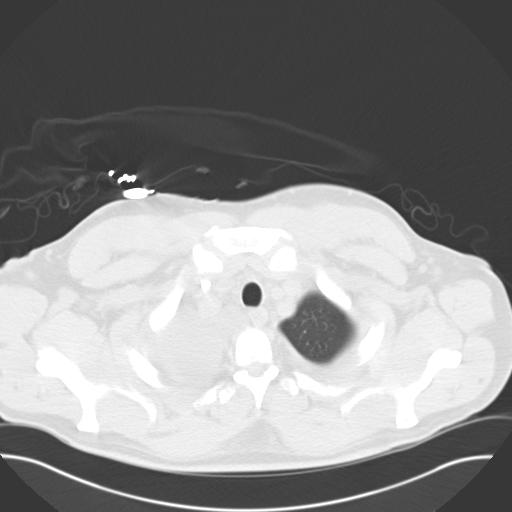
[im 63/66  lung]
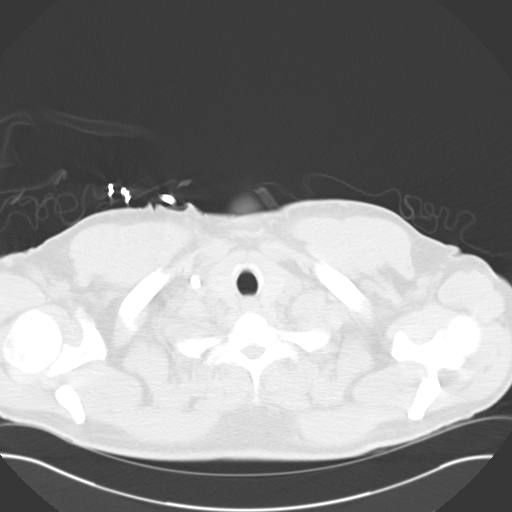

[15 of 31 positions shown; findings below may reference images not displayed]

PROCEDURE:     CT  - CT CHEST WITHOUT CONTRAST  - January 18, 2011  [DATE]

RESULT:     CT of the chest is performed without contrast and demonstrates a
moderate to large right pleural effusion with compressive atelectasis
involving the right middle lobe and right lower lobe. A trace left pleural
effusion is present. Minimal atherosclerotic calcification is present in the
aorta. There is no evidence of interstitial edema or endobronchial lesion.
No discrete mass is evident. There is a right-sided central venous catheter
present with the tip in the right atrium. Very minimal ground glass
attenuation is seen anteriorly in the left upper lobe and laterally in the
left upper lobe. Additional areas of atelectasis are present in the right
lung base. The upper abdominal structures appear within normal limits for a
noncontrast exam.
IMPRESSION: Large right pleural effusion with compressive atelectasis
as described. Trace left pleural effusion. Minimal ground glass densities
are present in the left upper lobe and are of uncertain significance.
Right-sided central venous catheter present 3

## 2012-05-26 IMAGING — CR DG CHEST 2V
1 series · 2 of 2 positions shown · non-contrast
Comparison: none

REASON FOR EXAM: CHF
COMMENTS:

[Series 1: view not recorded · 0.17mm/px · 2 of 2 slices shown]
[im 1/2]
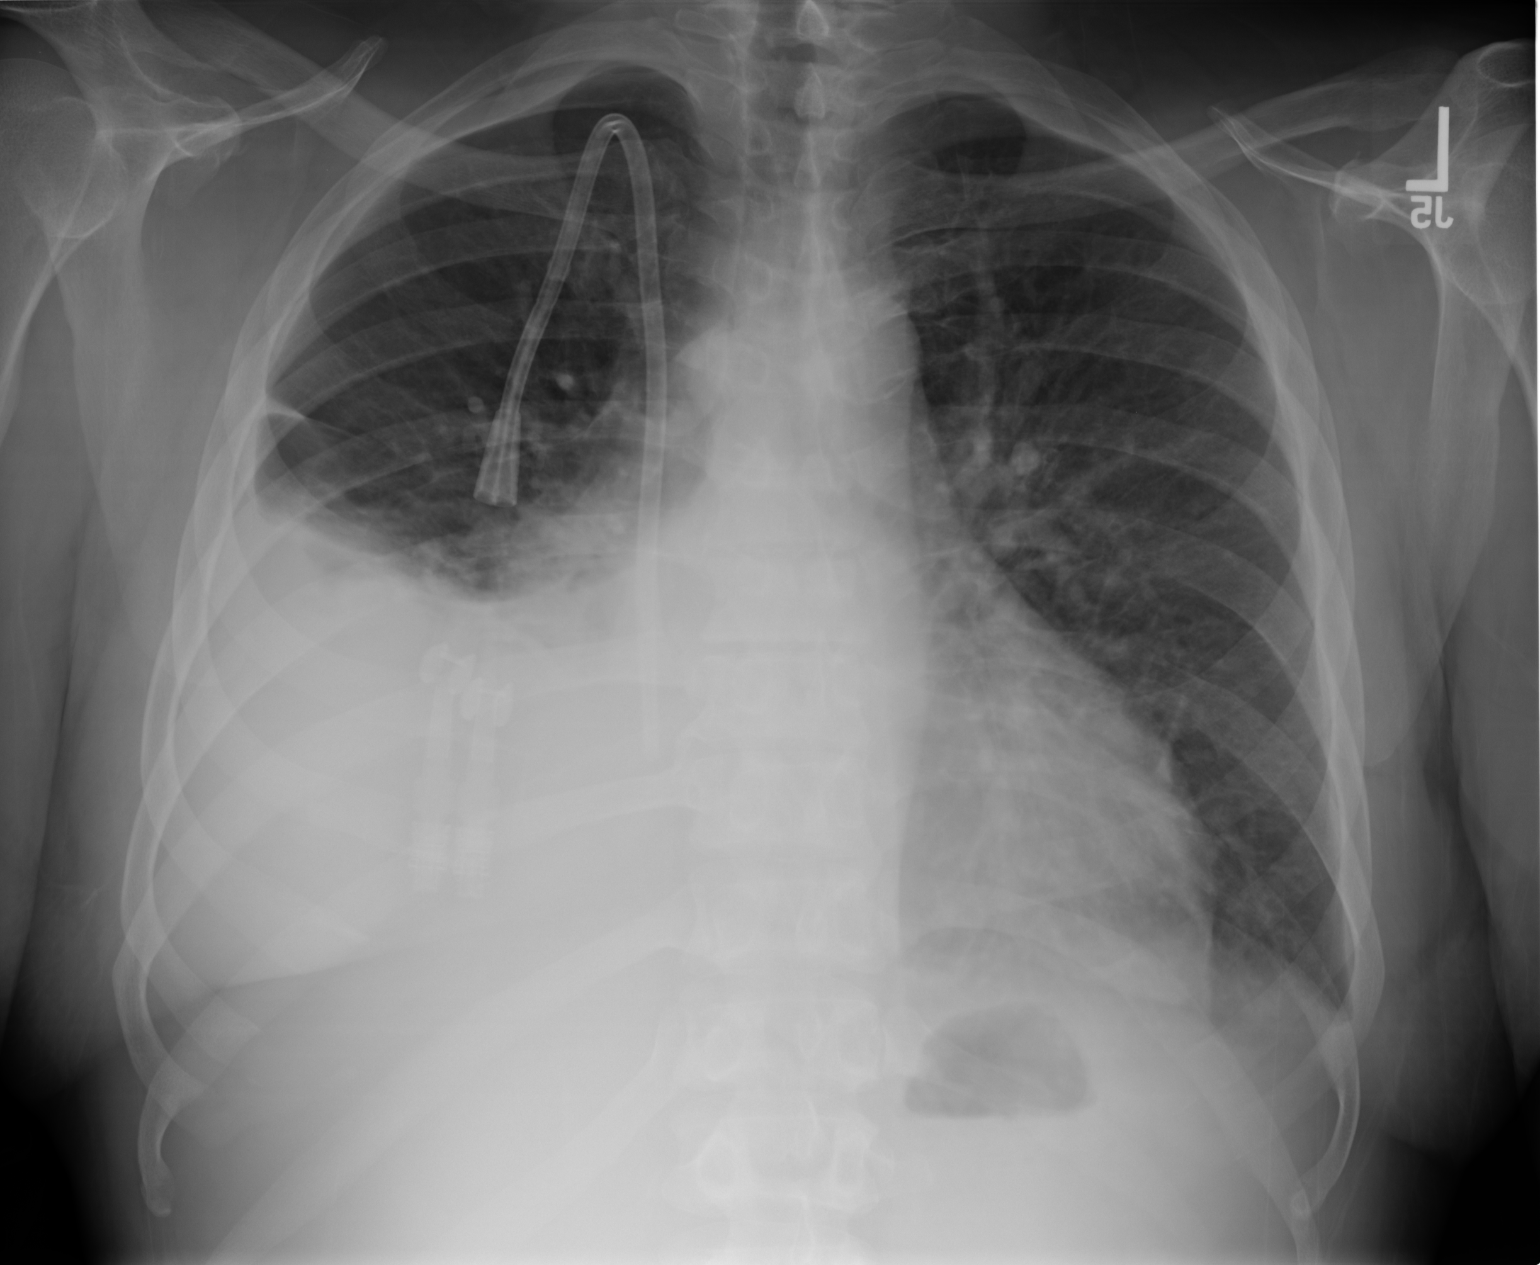
[im 2/2]
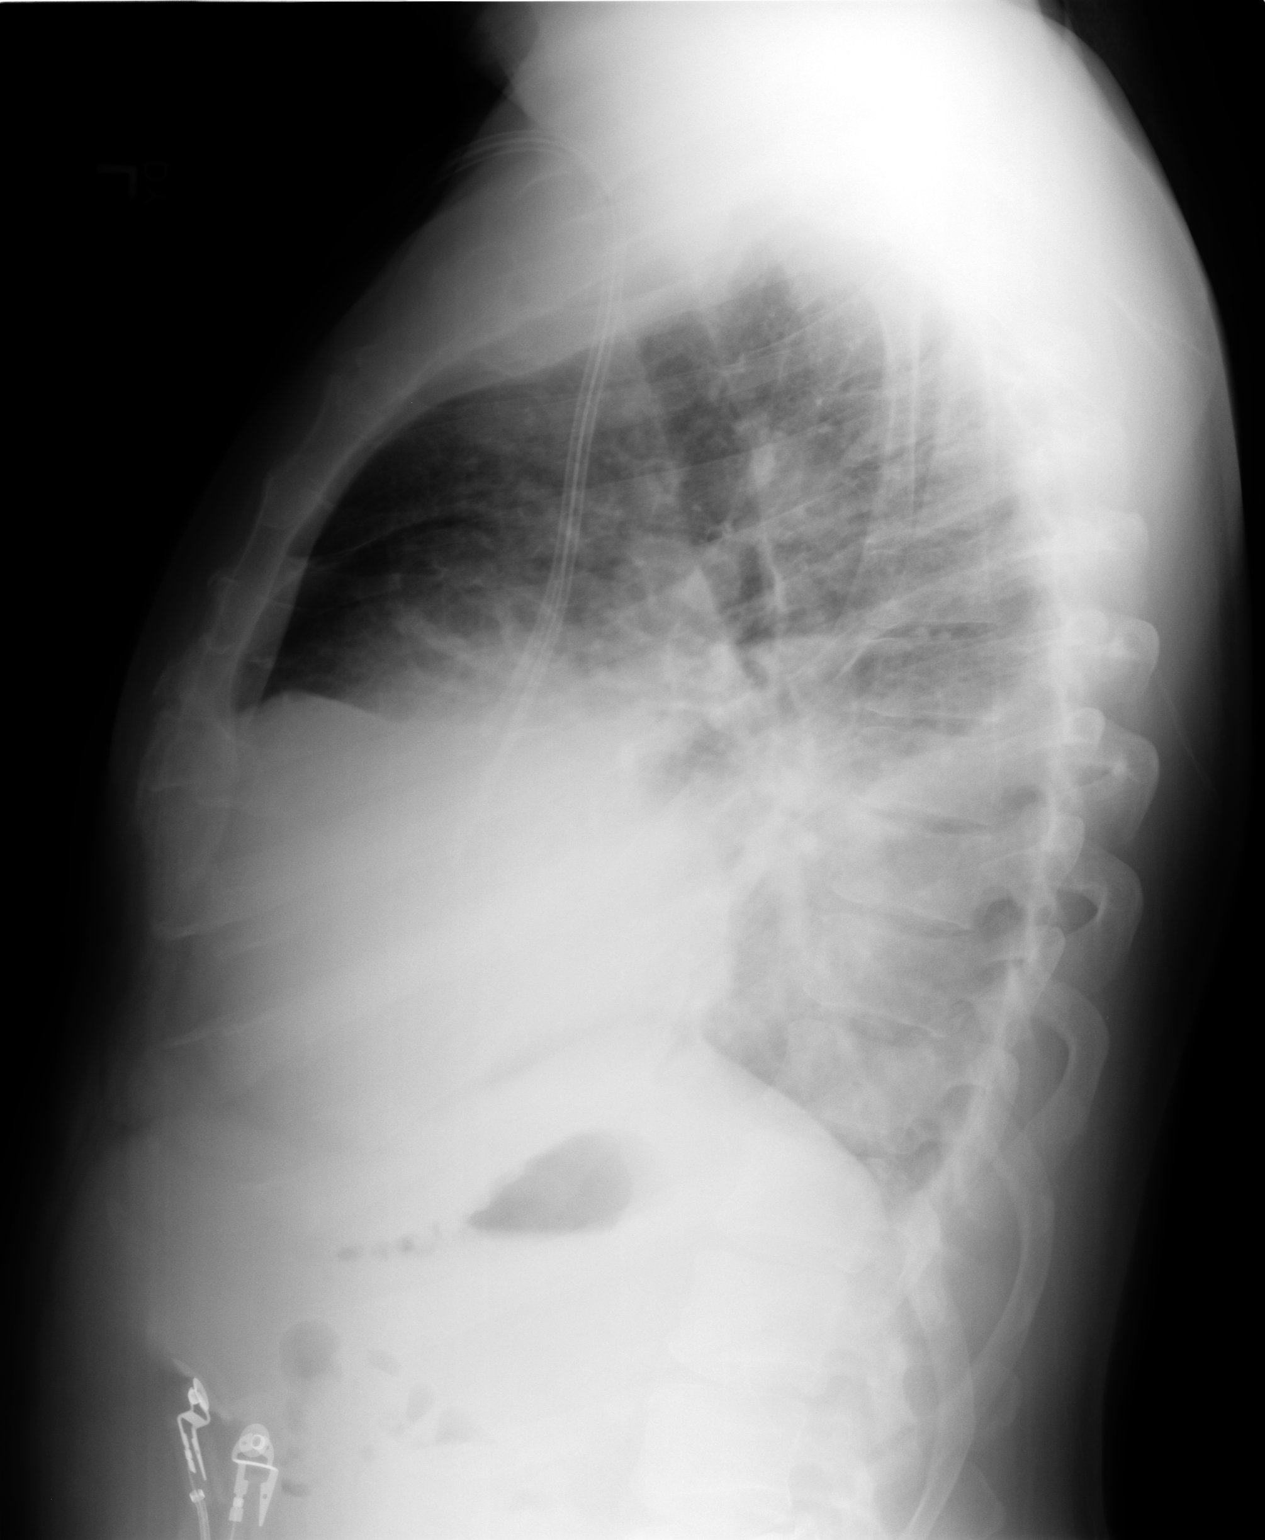

[2 of 2 positions shown; findings below may reference images not displayed]

PROCEDURE:     DXR - DXR CHEST PA (OR AP) AND LATERAL  - January 18, 2011  [DATE]

RESULT:     Comparison is made to a prior study dated 01/17/2011.

Persistent right pleural effusion is identified. Dialysis catheter is
appreciated with the tip at the level of the superior vena cava/right atrial
junction. There is thickening of the interstitial markings. The cardiac
silhouette is enlarged. The visualized bony skeleton is unremarkable.
IMPRESSION: 1. Persistent right pleural effusion.
2. Edematous versus nonedematous interstitial infiltrate. Surveillance
evaluation recommended.

## 2012-06-24 IMAGING — CT CT CHEST W/ CM
1 series · 15 of 33 positions shown, 19 images · IV contrast (APPLIED)
Comparison: none

REASON FOR EXAM: shortness of breath - hx of SVC clot - eval for PE, eval
for effusion vs. edema
COMMENTS:

PROCEDURE:     CT  - CT CHEST (FOR PE) W  - February 16, 2011 [DATE]
RESULT:     Chest CT dated 02/16/2011 comparison made to prior study dated
01/18/2011.
TECHNIQUE: Helical 3 mm sections were obtained the thoracic inlet through
the lung bases status post intravenous administration of 85 mL of [id number].

[Series 4: soft tissue · axial · 0.74mm/px · z∈[-596,-284]mm · 15 of 124 slices shown, 19 images]
[im 10/124  mediastinal]
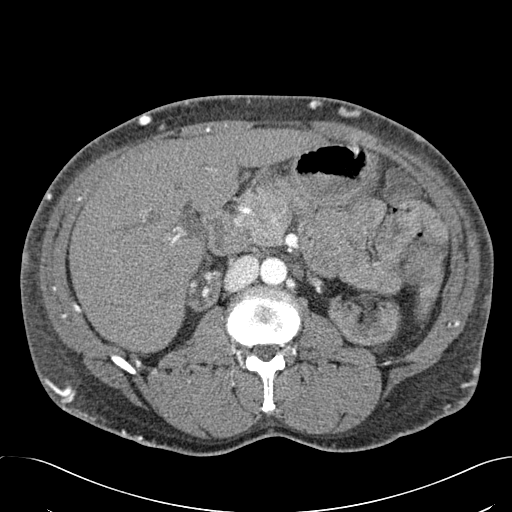
[im 10/124  lung]
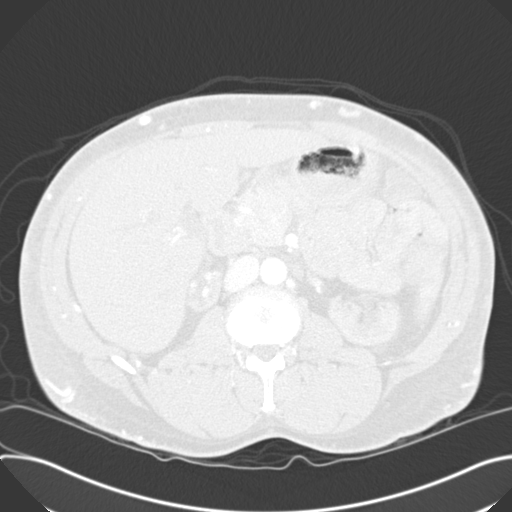
[im 19/124  lung]
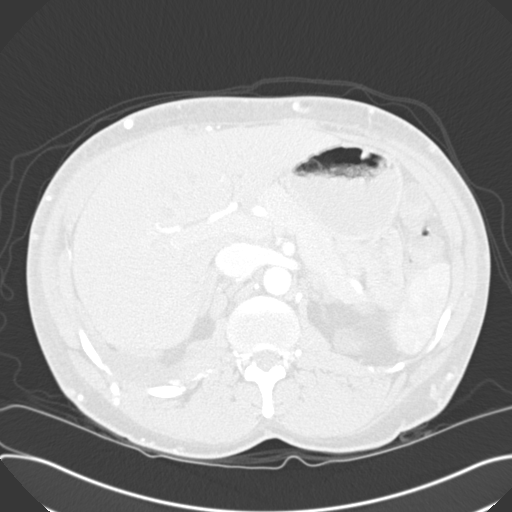
[im 25/124  lung]
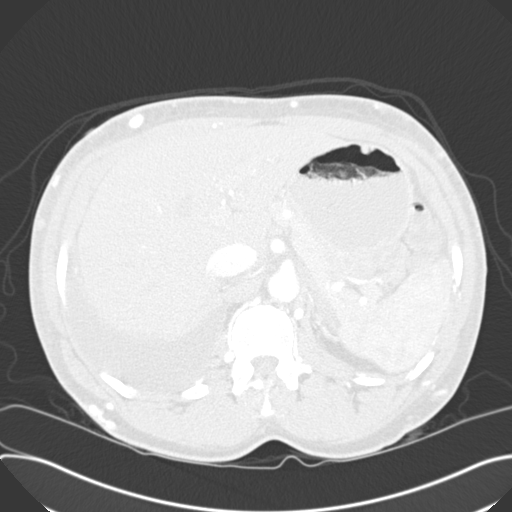
[im 32/124  lung]
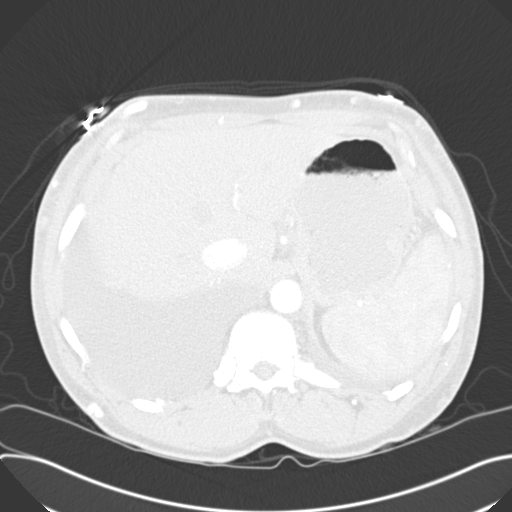
[im 42/124  mediastinal]
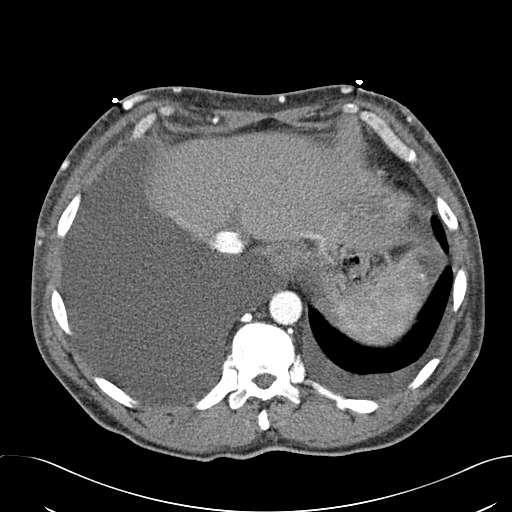
[im 42/124  lung]
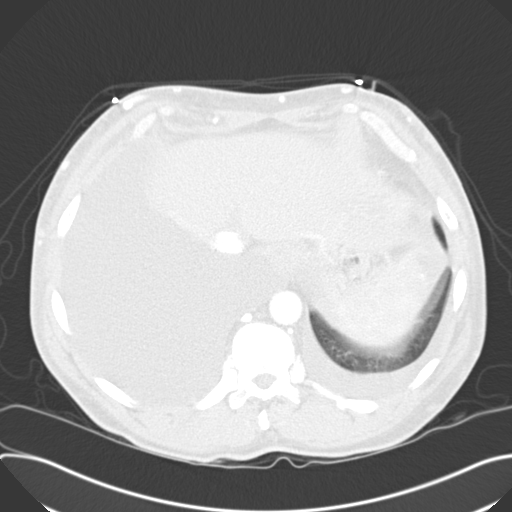
[im 50/124  lung]
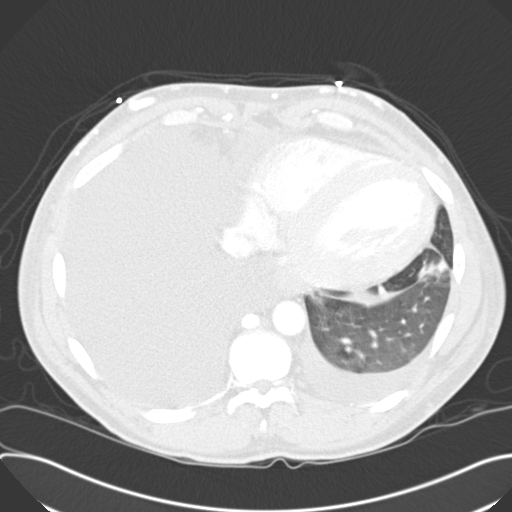
[im 55/124  lung]
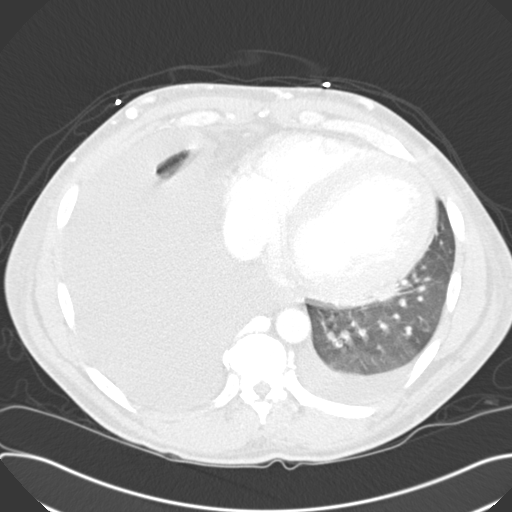
[im 64/124  lung]
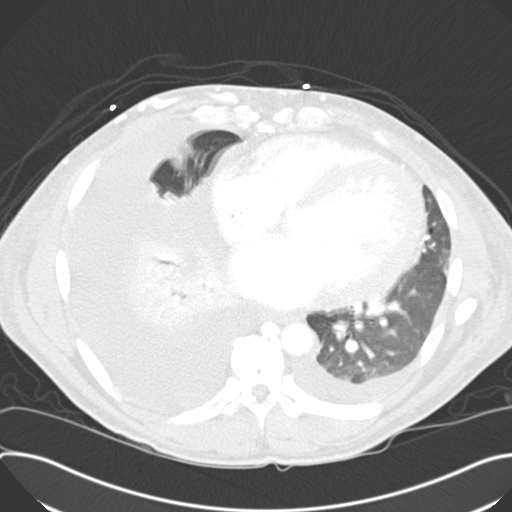
[im 69/124  mediastinal]
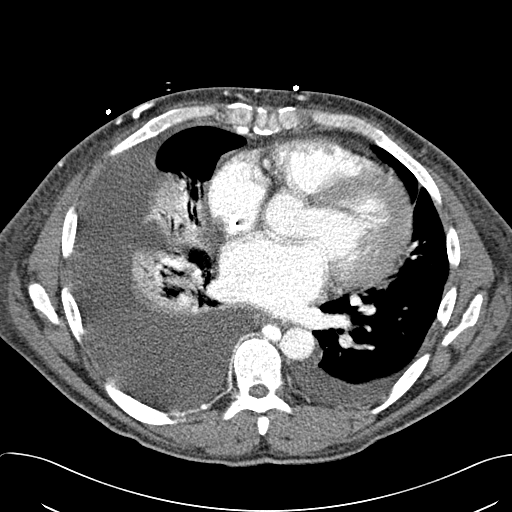
[im 69/124  lung]
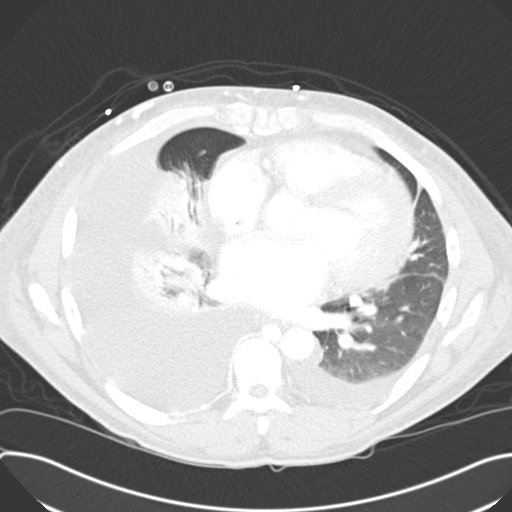
[im 74/124  lung]
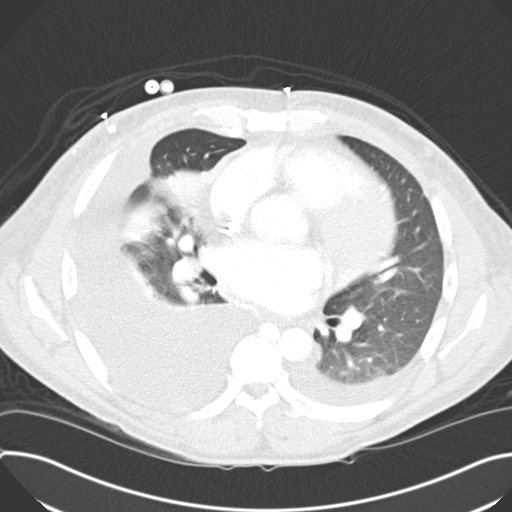
[im 83/124  lung]
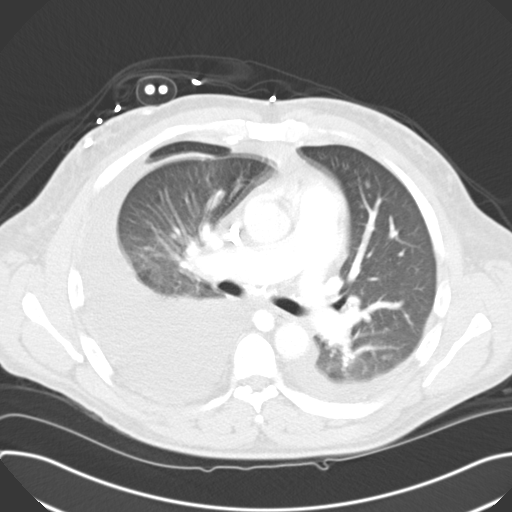
[im 92/124  lung]
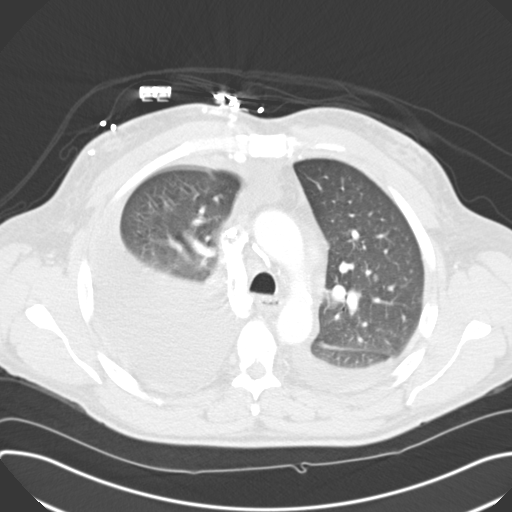
[im 99/124  mediastinal]
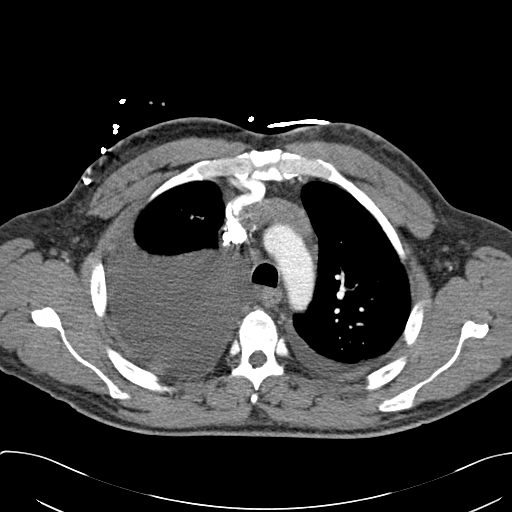
[im 99/124  lung]
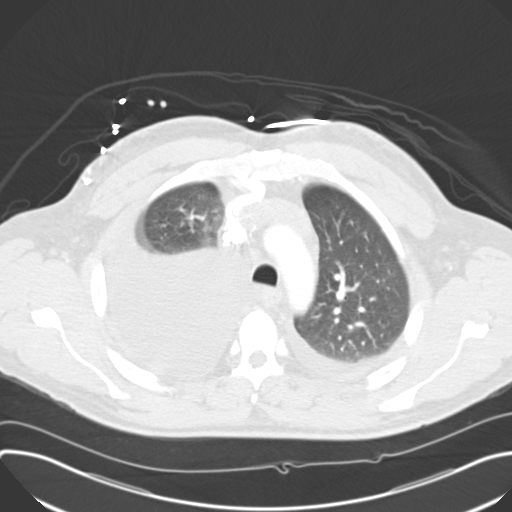
[im 105/124  lung]
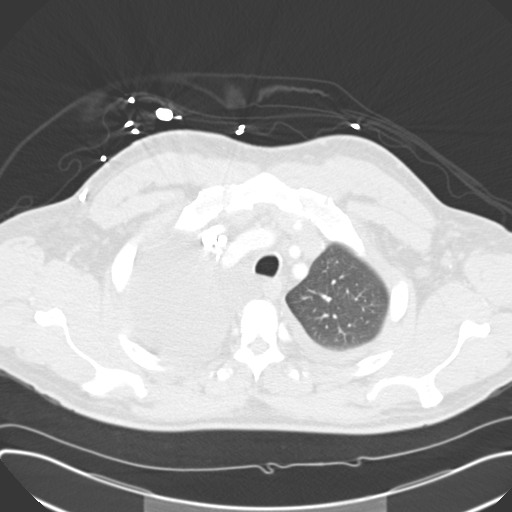
[im 114/124  lung]
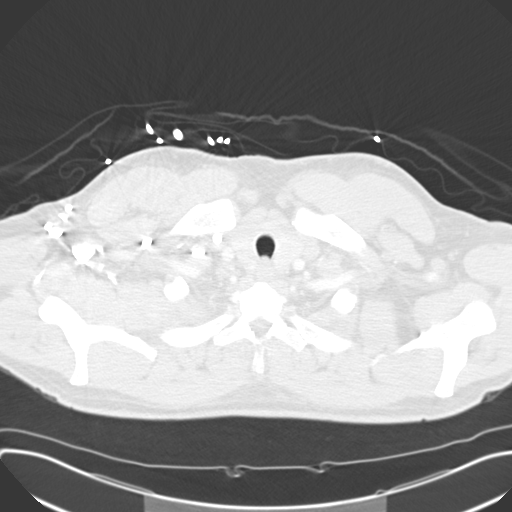

[15 of 33 positions shown; findings below may reference images not displayed]

FINDINGS: A moderate to large right pleural effusion is identified. There is
dependent atelectasis within the base the aerated lung.

Multichamber cardiac enlargement is appreciated. There is no evidence of
mediastinal nor hilar masses or adenopathy. There is no evidence of filling
defects within the main, lobar, or segmental pulmonary arteries. A small
left pleural effusion is identified. There is prominence of interstitial
markings. The visualized upper abdominal viscera demonstrates partial
visualization of what appears to be a horseshoe kidney. Otherwise appear to
be grossly unremarkable.

Venous collaterals identified within the anterior and posterior chest wall
consistent reported history of SVC thrombus.
IMPRESSION: No CT evidence of of pulmonary arterial embolic disease.
2. Moderate to large right pleural effusion
3. Findings likely representing a component of pulmonary edema.
4. Cardiomegaly

## 2012-06-24 IMAGING — CR DG CHEST 1V PORT
1 series · 1 of 1 positions shown · non-contrast
Comparison: none

REASON FOR EXAM: Chest Pain
COMMENTS:

[view not recorded]
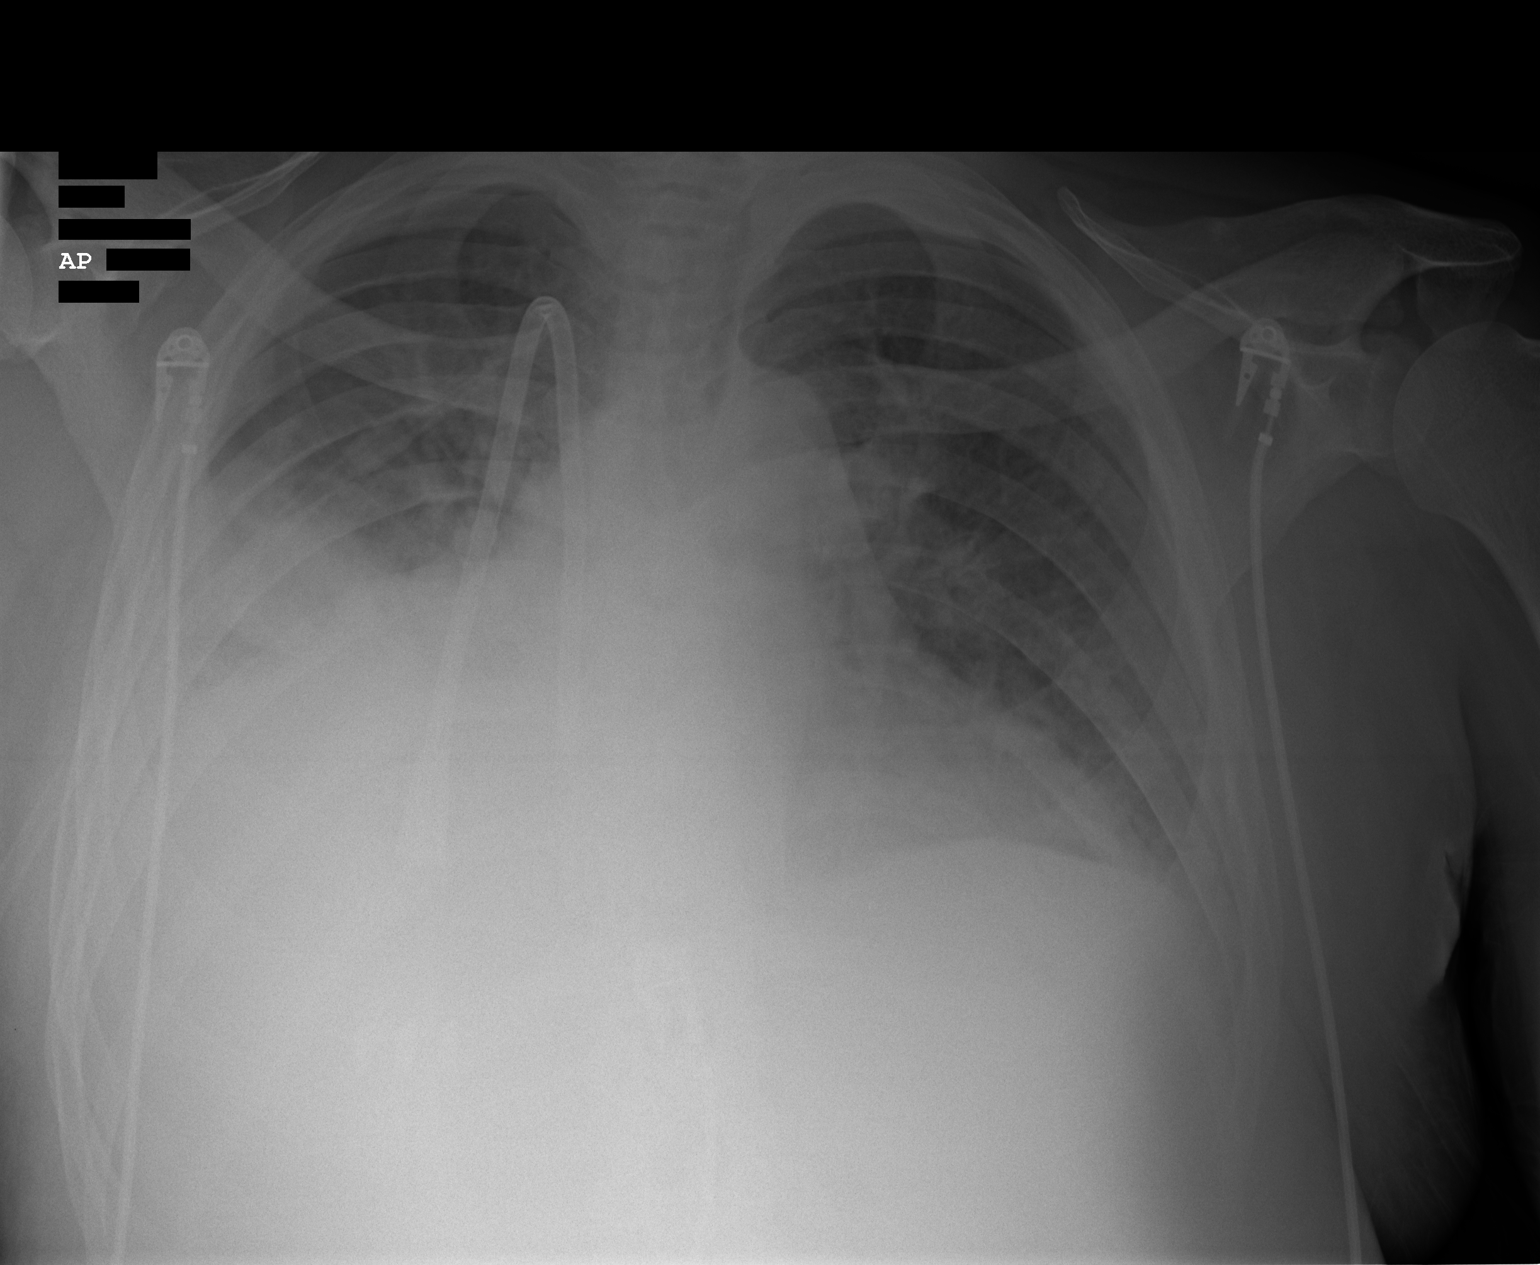

[1 of 1 positions shown; findings below may reference images not displayed]

PROCEDURE:     DXR - DXR PORTABLE CHEST SINGLE VIEW  - February 16, 2011  [DATE]

RESULT:     Comparison is made to the study of 01/18/2011.

A duel lumen central venous catheter is present. There is a large right
pleural effusion. Pulmonary vascular congestion is present. The heart is
difficult to assess. The size of the right pleural effusion may be slightly
larger than seen previously.
IMPRESSION: Pulmonary vascular congestion possibly with some edema with a large right
pleural effusion.

## 2012-06-25 IMAGING — CR DG CHEST 2V
1 series · 2 of 2 positions shown · non-contrast
Comparison: none

REASON FOR EXAM: sob
COMMENTS:

[Series 1: view not recorded · 0.17mm/px · 2 of 2 slices shown]
[im 1/2]
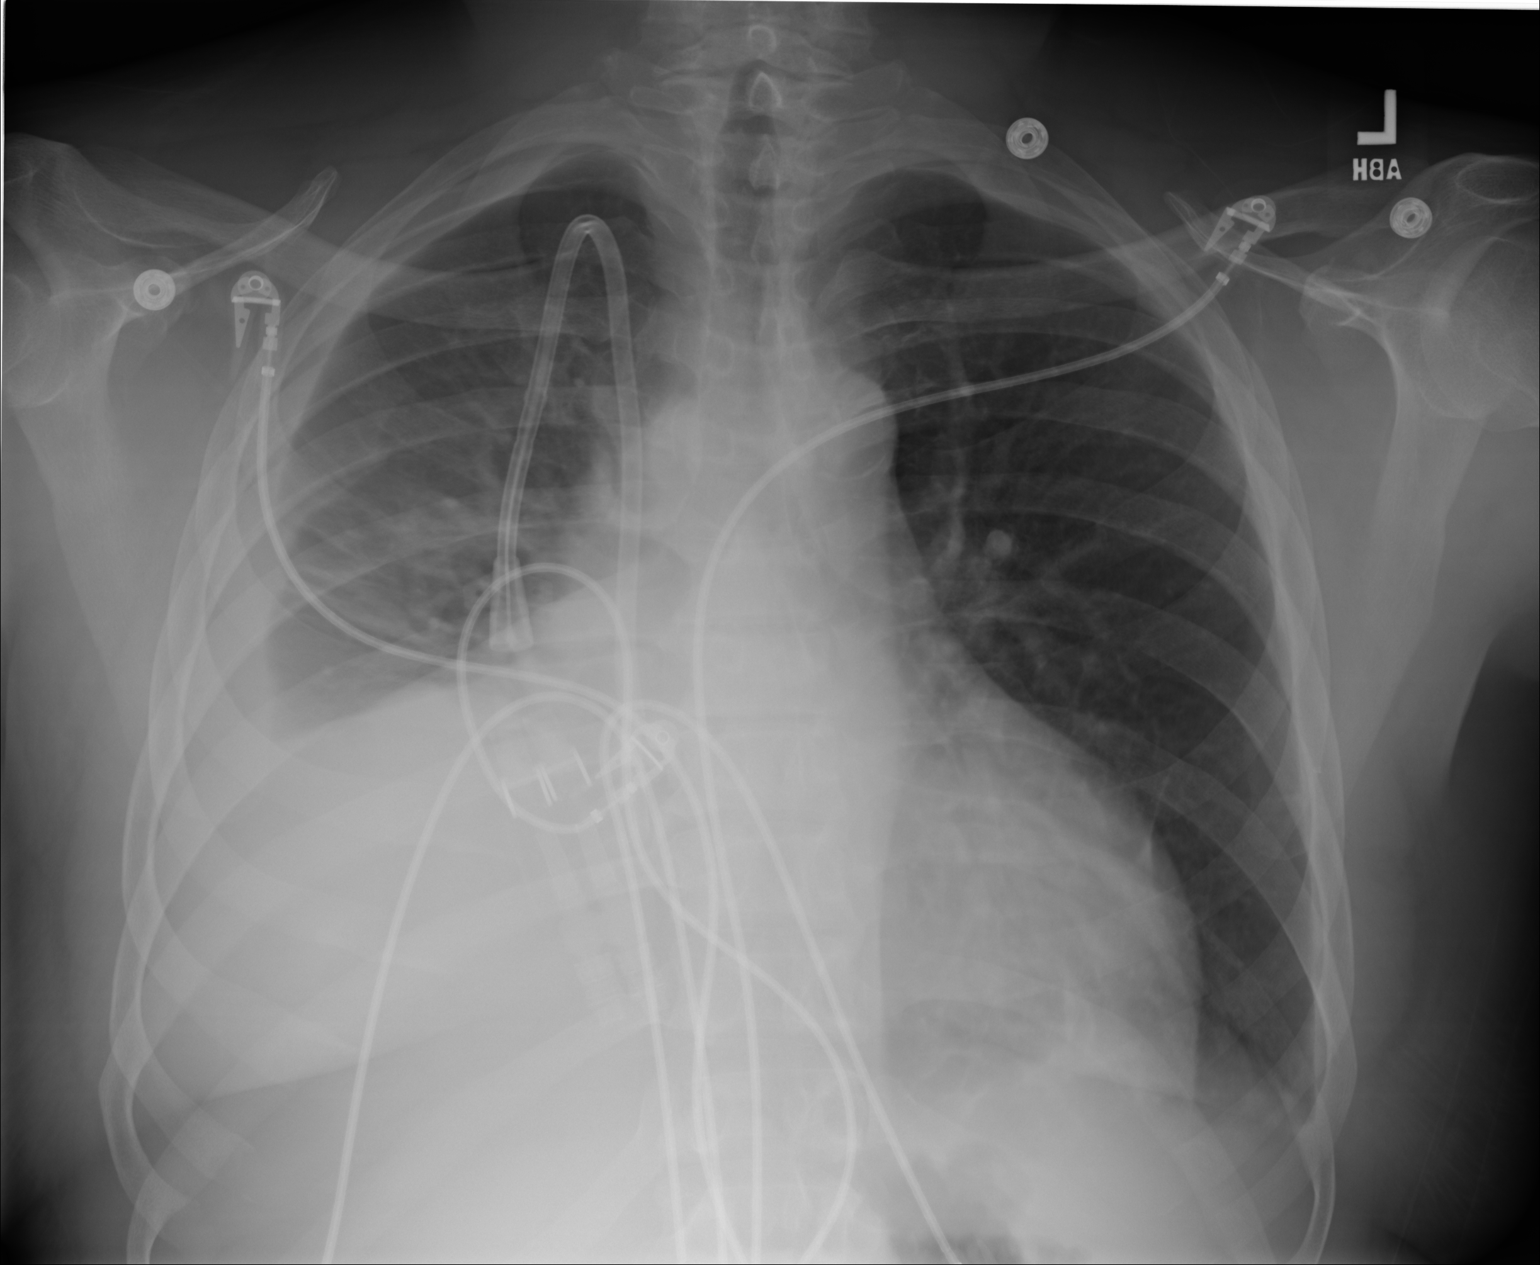
[im 2/2]
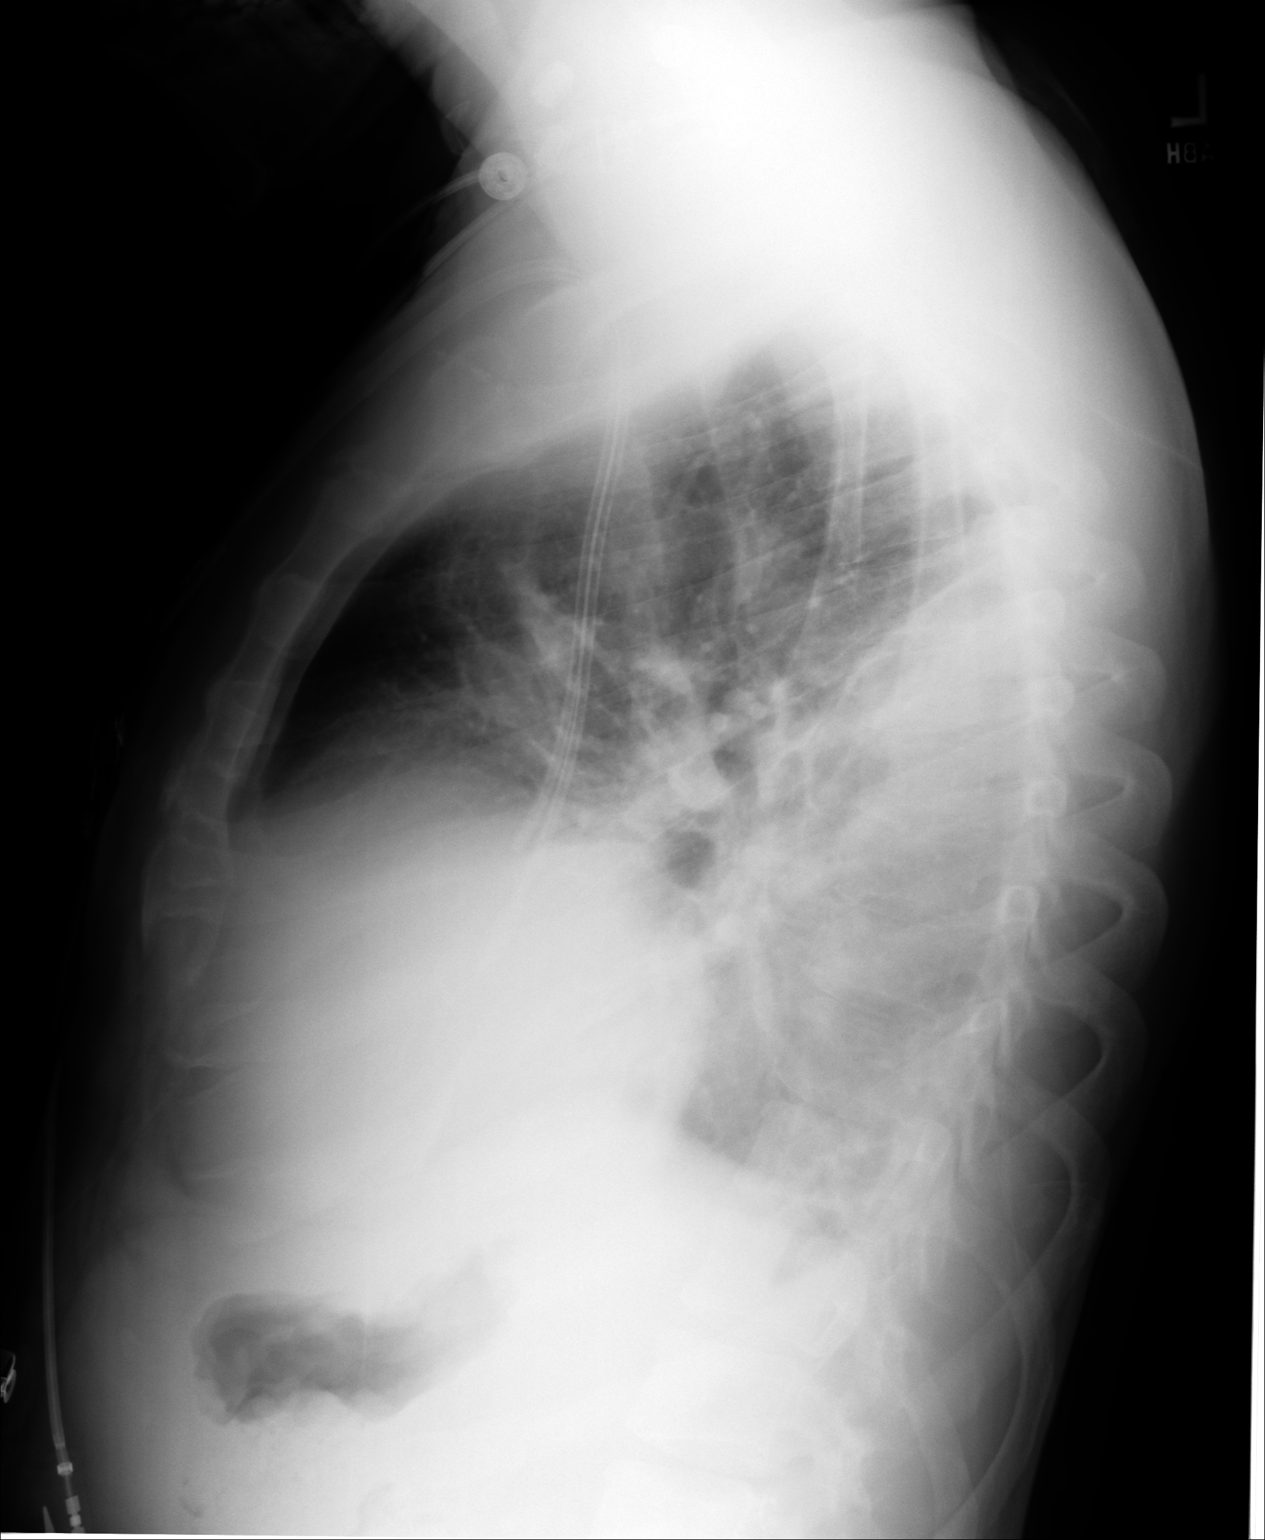

[2 of 2 positions shown; findings below may reference images not displayed]

PROCEDURE:     DXR - DXR CHEST PA (OR AP) AND LATERAL  - February 17, 2011  [DATE]

RESULT:     Comparison is made to the study 16 February, 2011.

There is volume loss on the right which is unchanged. The aerated portion of
the right lung is greater today than on the earlier study however. The left
lung is better inflated. There is left basilar atelectasis. The cardiac
silhouette is likely mildly enlarged and the pulmonary vascularity is
engorged.
IMPRESSION: There remains significant volume loss on the right with a
large pleural effusion as well is likely underlying atelectasis. There is
evidence of CHF. The indwelling dual-lumen type catheter tip lies in the
region of the distal SVC.

## 2012-06-27 IMAGING — XA IR VASCULAR PROCEDURE
11 series · 15 of 16 positions shown · non-contrast
Comparison: none

[Series 1: venogram · 2 of 2 slices shown (1 of 5)]
[im 1/2]
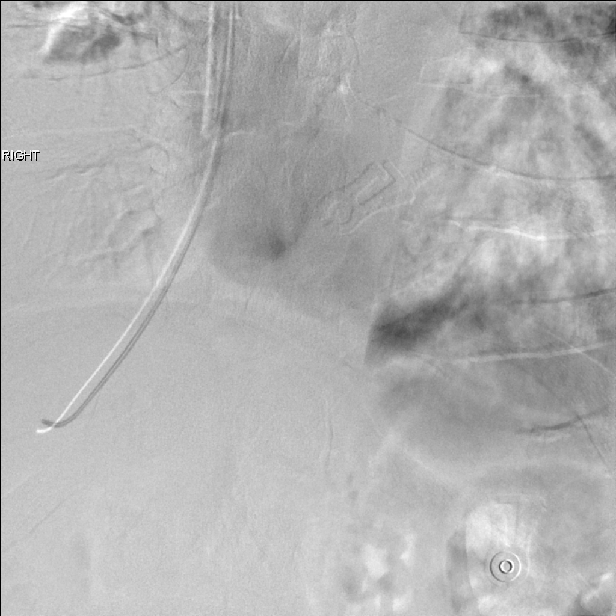
[im 2/2]
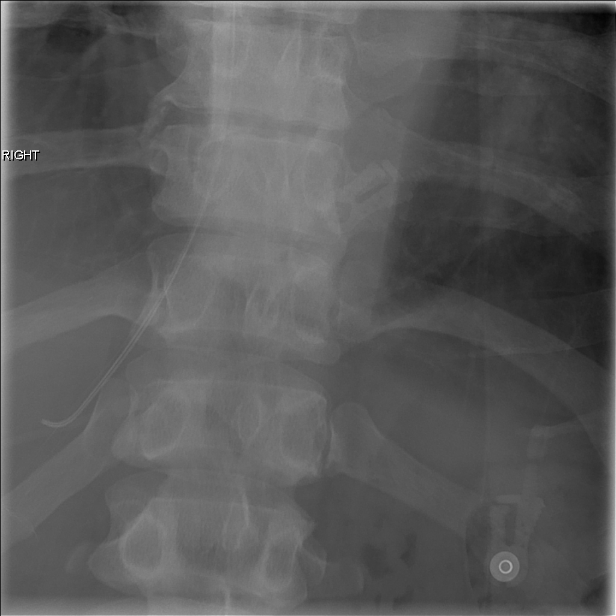

[Series 2: venogram · 2 of 2 slices shown (2 of 5)]
[im 1/2]
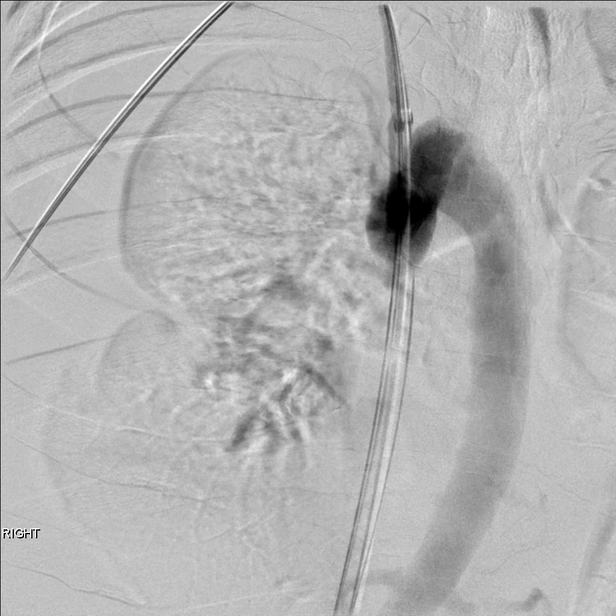
[im 2/2]
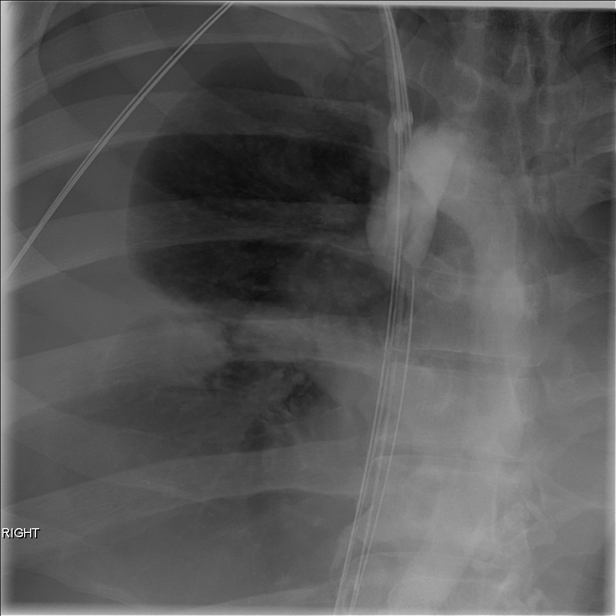

[Series 3: fl - angio · 1 of 1 slices shown (1 of 6)]
[im 1/1]
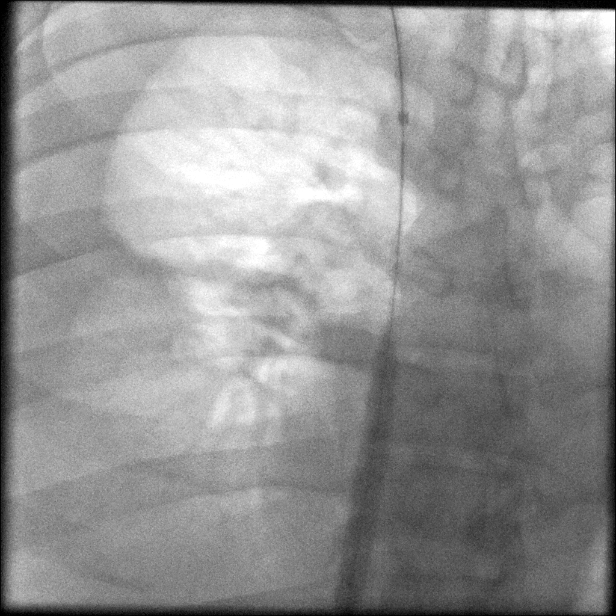

[Series 4: fl - angio · 1 of 1 slices shown (2 of 6)]
[im 1/1]
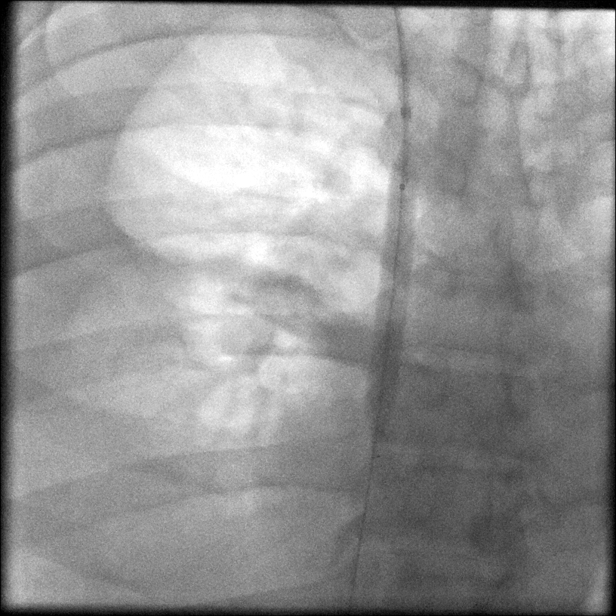

[Series 5: fl - angio · 1 of 1 slices shown (3 of 6)]
[im 1/1]
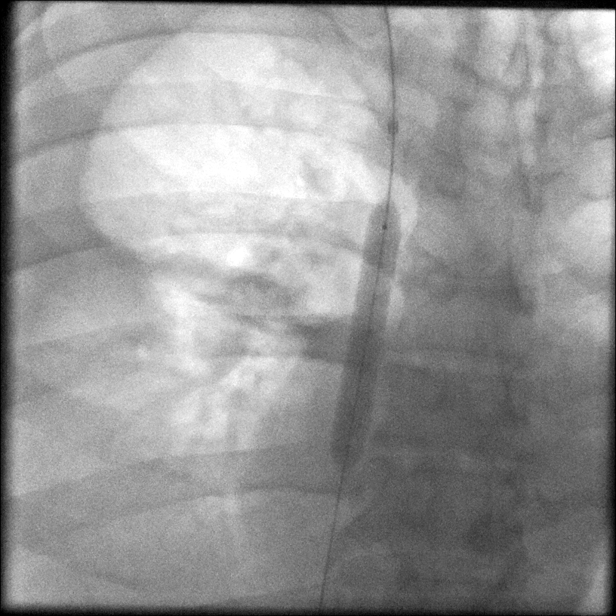

[Series 6: venogram · 1 of 2 slices shown (3 of 5)]
[im 2/2]
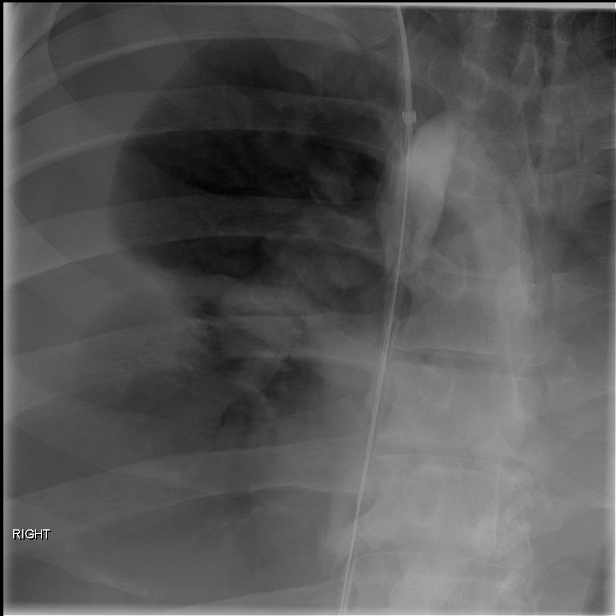

[Series 7: fl - angio · 1 of 1 slices shown (4 of 6)]
[im 1/1]
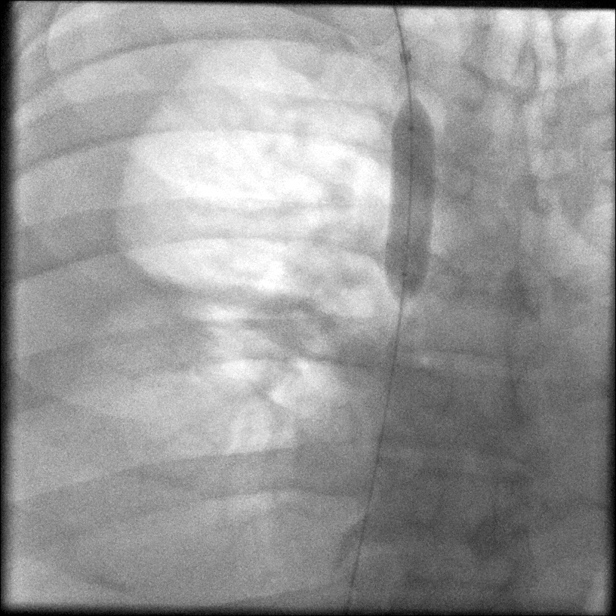

[Series 8: fl - angio · 1 of 1 slices shown (5 of 6)]
[im 1/1]
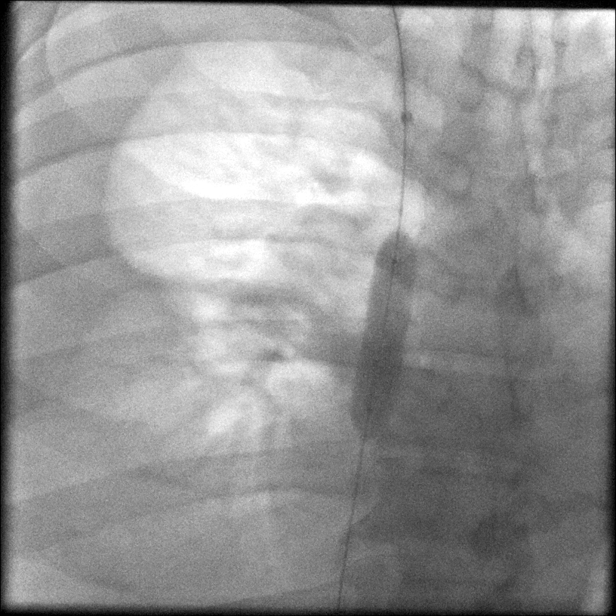

[Series 9: venogram · 2 of 2 slices shown (4 of 5)]
[im 1/2]
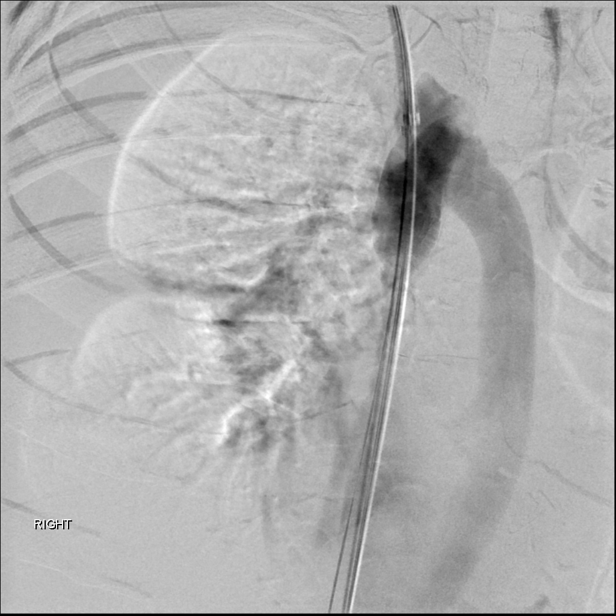
[im 2/2]
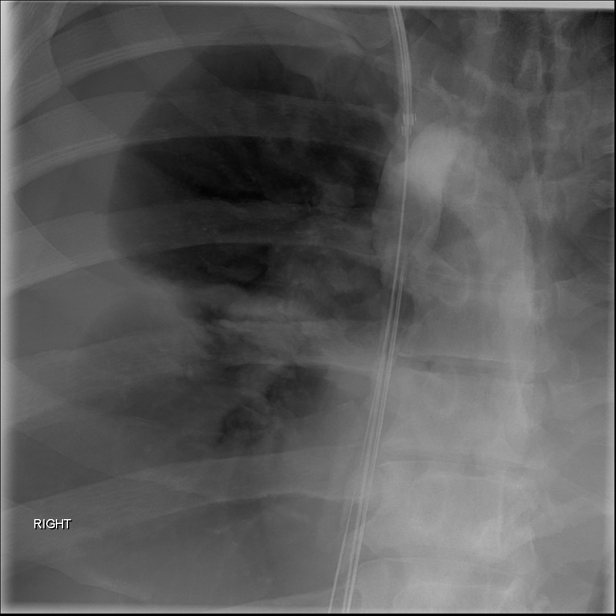

[Series 10: fl - angio · 1 of 1 slices shown (6 of 6)]
[im 1/1]
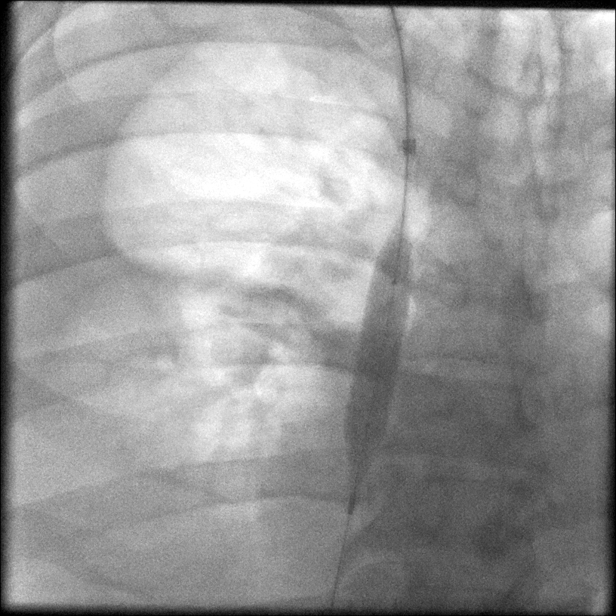

[Series 11: venogram · 2 of 2 slices shown (5 of 5)]
[im 1/2]
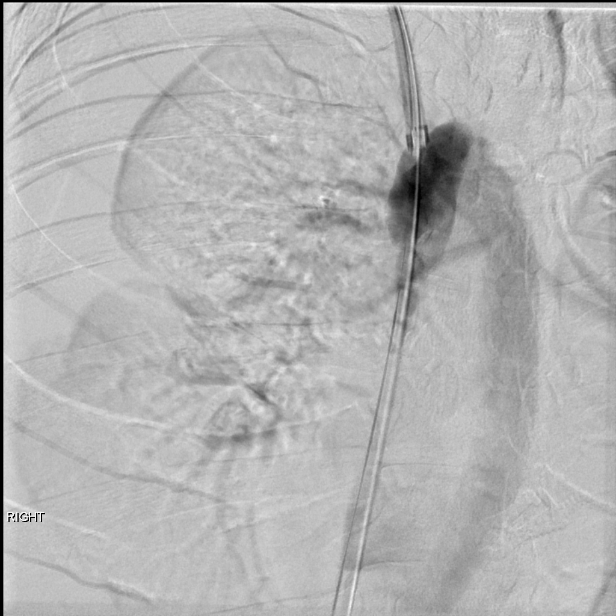
[im 2/2]
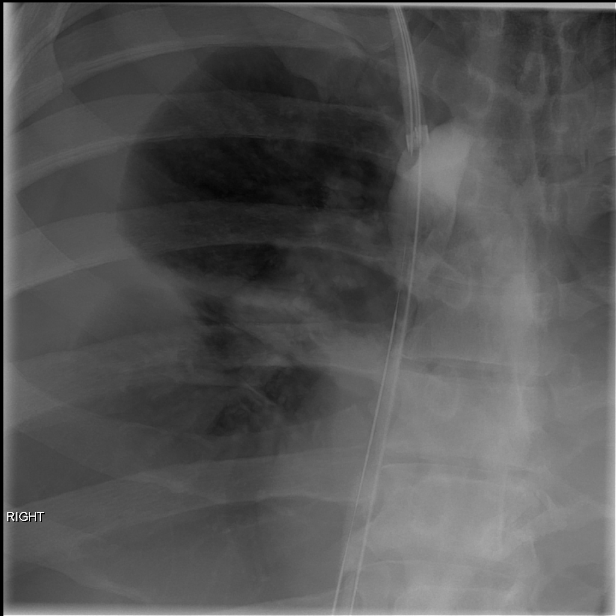

[15 of 16 positions shown; findings below may reference images not displayed]

IMAGES IMPORTED FROM THE [employer] WORK[employer]OW SYSTEM
NO DICTATION FOR STUDY
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
## 2012-08-18 IMAGING — MR MRI LUMBAR SPINE WITHOUT CONTRAST
5 series · 34 of 48 positions shown · non-contrast
Comparison: none

REASON FOR EXAM: neck pain low back pain
COMMENTS:

PROCEDURE:     MR  - MR LUMBAR SPINE WO CONTRAST  - April 12, 2011  [DATE]
RESULT:     Comparison: None
TECHNIQUE: Standard lumbar spine protocol, without administration of IV
contrast.

[Series 2: T2 · sagittal · 4.0mm · 1.00mm/px · 6 of 17 slices shown (1 of 2)]
[im 1/17]
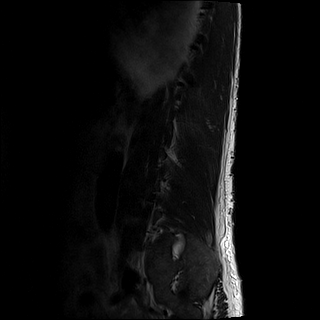
[im 4/17]
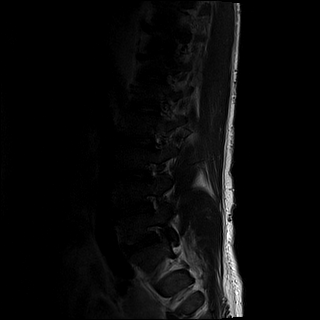
[im 7/17]
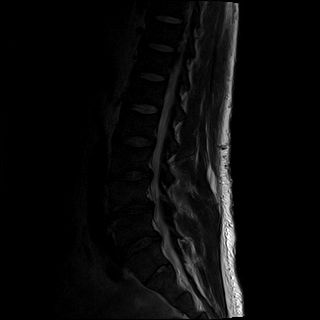
[im 10/17]
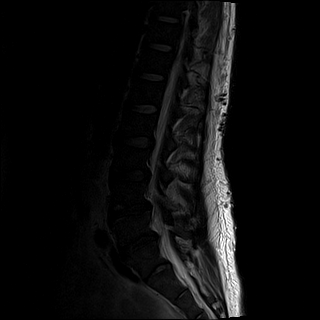
[im 13/17]
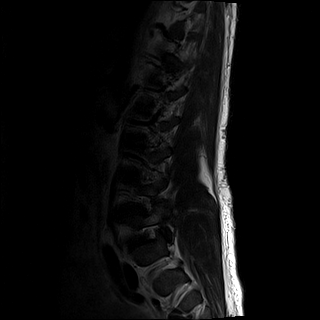
[im 17/17]
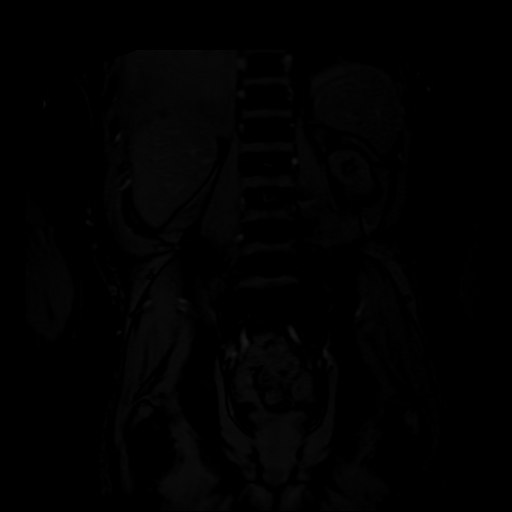

[Series 3: T1 · sagittal · 4.0mm · 1.00mm/px · 6 of 17 slices shown (1 of 2)]
[im 1/17]
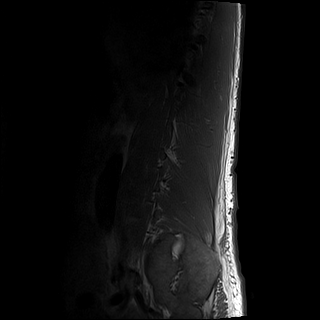
[im 4/17]
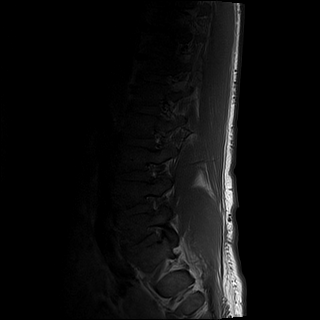
[im 7/17]
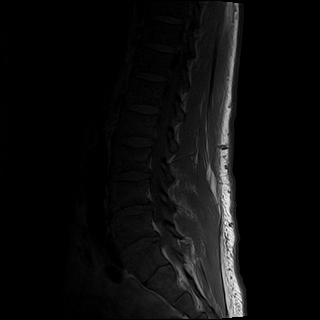
[im 10/17]
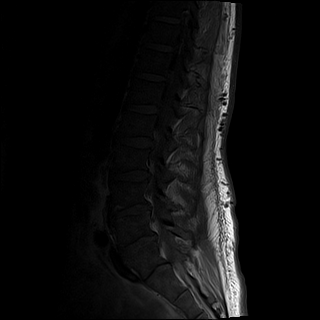
[im 13/17]
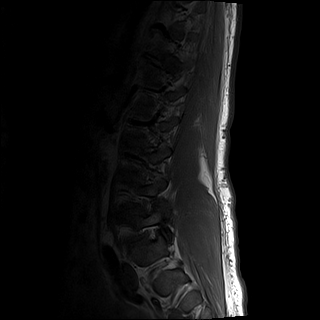
[im 17/17]
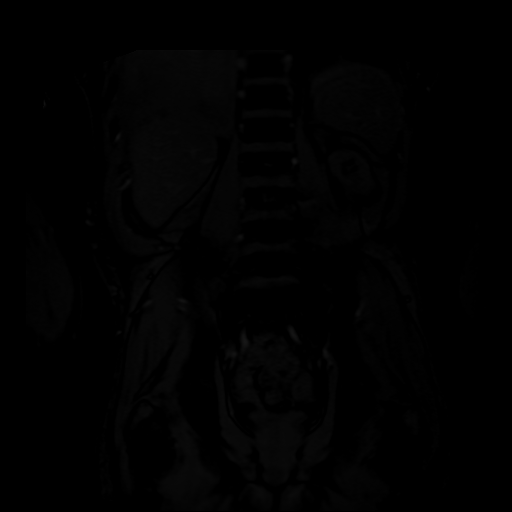

[Series 4: T2 fat-sat · sagittal · 4.0mm · 1.00mm/px · 4 of 17 slices shown]
[im 1/17]
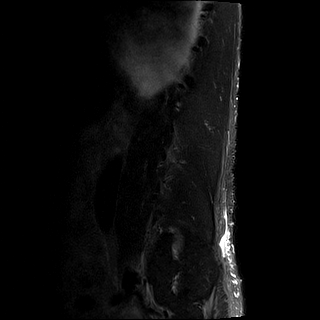
[im 4/17]
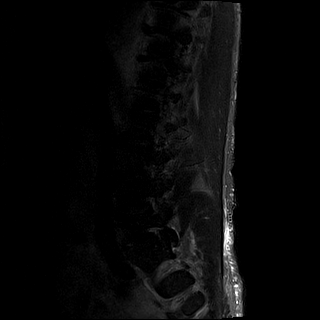
[im 7/17]
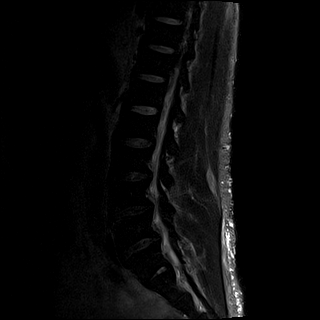
[im 10/17]
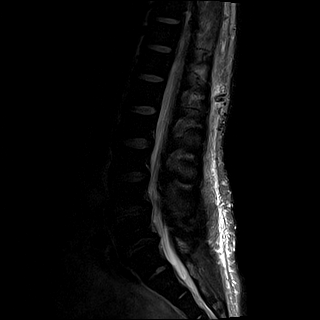

[Series 5: T2 · axial · 4.0mm · 0.78mm/px · z∈[-56,+237]mm · 9 of 42 slices shown (2 of 2)]
[im 1/42]
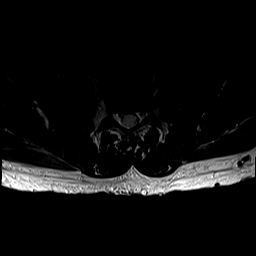
[im 6/42]
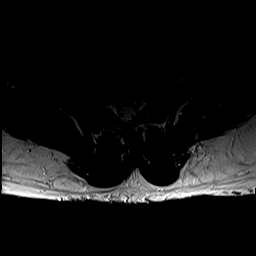
[im 12/42]
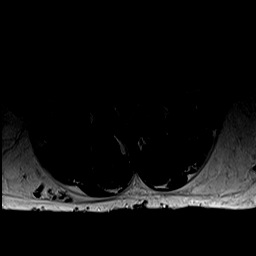
[im 18/42]
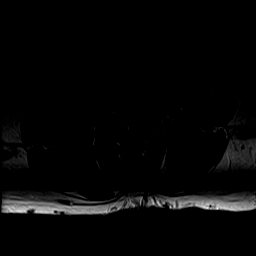
[im 21/42]
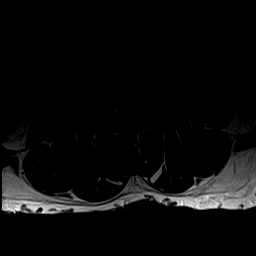
[im 24/42]
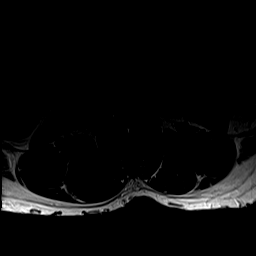
[im 30/42]
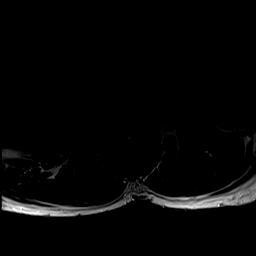
[im 36/42]
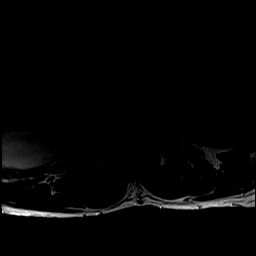
[im 42/42]
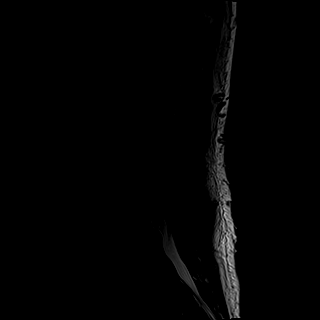

[Series 5005: T1 · axial · 4.0mm · 0.39mm/px · z∈[-56,+237]mm · 9 of 42 slices shown (2 of 2)]
[im 1/42]
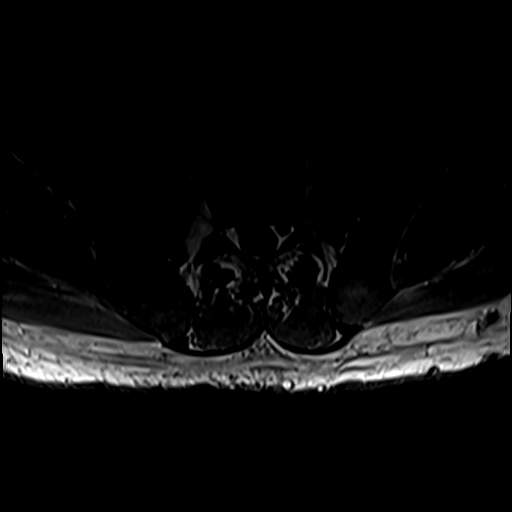
[im 6/42]
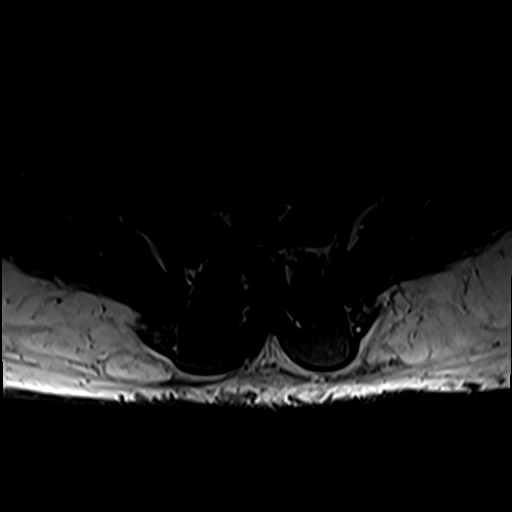
[im 12/42]
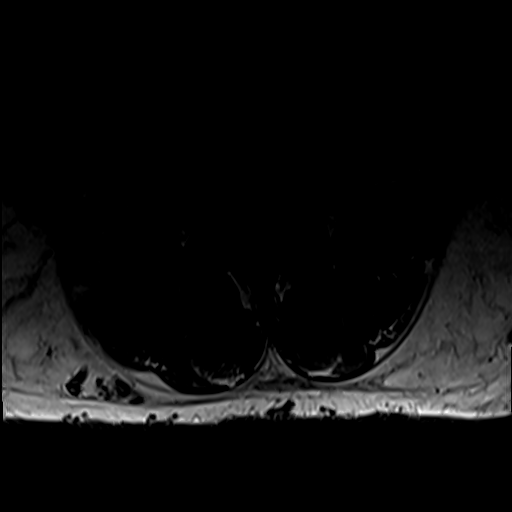
[im 18/42]
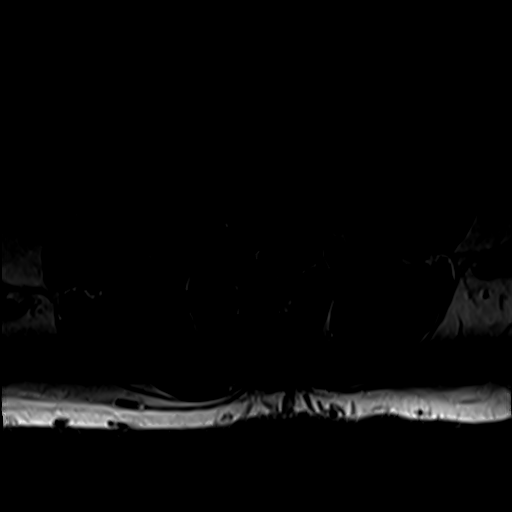
[im 21/42]
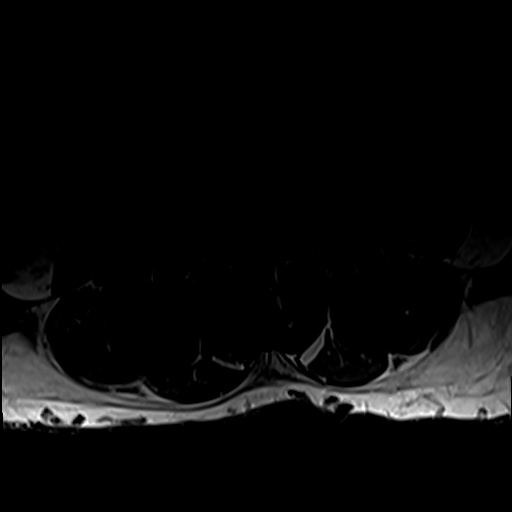
[im 24/42]
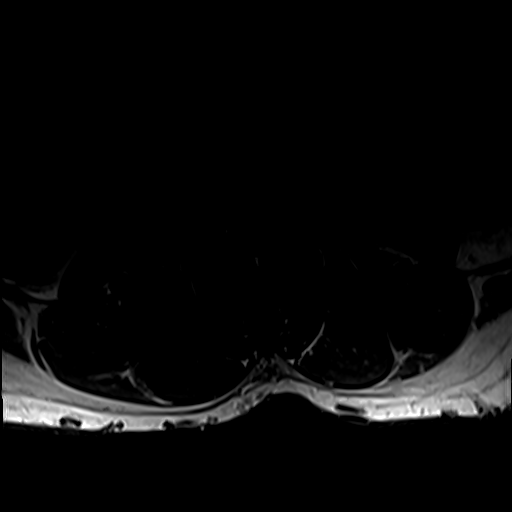
[im 30/42]
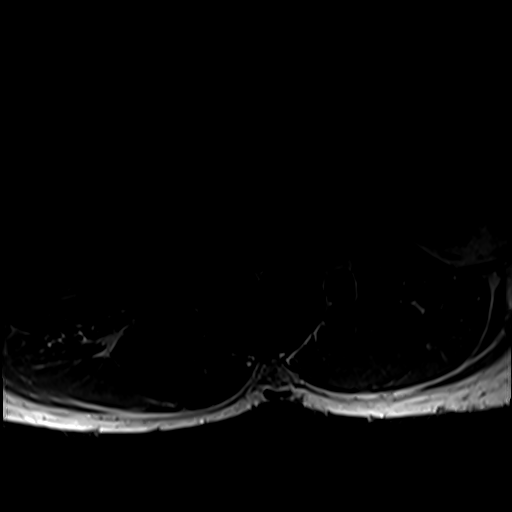
[im 36/42]
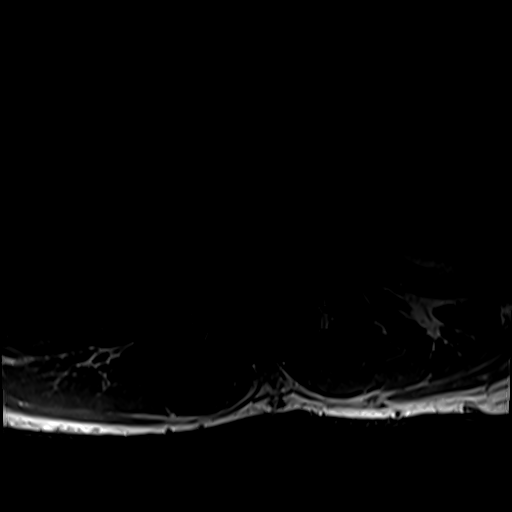
[im 42/42]
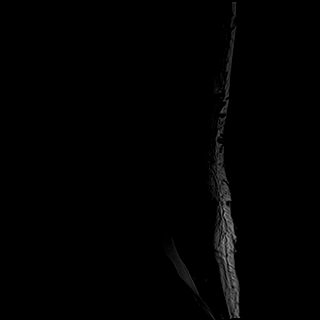

[34 of 48 positions shown; findings below may reference images not displayed]

FINDINGS: The marrow is diffusely low in T1 signal, consistent with patient's renal
failure. Vertebral body heights are maintained. There is normal alignment.
The conus terminates at L1-L2.

T12-L1: No significant disc bulge or neuroforaminal narrowing.

L1-L2: No significant disc bulge or neuroforaminal narrowing.

L2-L3: No significant disc bulge or neuroforaminal narrowing.

L3-L4: Mild posterior disc bulge causes flattening of the thecal sac. No
neuroforaminal narrowing.

L4-L5: Mild posterior disc bulge causes flattening of the thecal sac. There
is mild degenerative facet disease. No neuroforaminal narrowing.

L5-S1: Mild posterior disc bulge causes flattening of the thecal sac. There
is mild degenerative disease. No neuroforaminal narrowing.

Small T2 hyperintense structures in left kidney likely represents cyst.
IMPRESSION: Mild degenerative disc and facet disease in the lower lumbar spine, without
canal stenosis or neuroforaminal narrowing.

## 2012-08-18 IMAGING — MR MRI CERVICAL SPINE WITHOUT CONTRAST
6 series · 38 of 48 positions shown · non-contrast
Comparison: none

REASON FOR EXAM: neck pain low back pain
COMMENTS:

PROCEDURE:     MR  - MR CERVICAL SPINE WO CONT  - April 12, 2011  [DATE]
RESULT:     Comparison: None
TECHNIQUE: Standard cervical spine protocol, without administration of IV
contrast.

[Series 101: T2 · sagittal · 3.0mm · 0.81mm/px · 5 of 14 slices shown (1 of 3)]
[im 1/14]
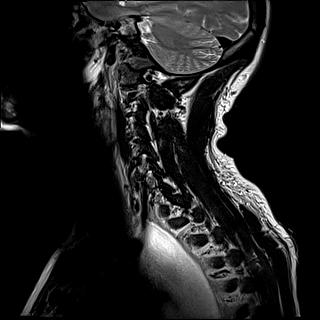
[im 4/14]
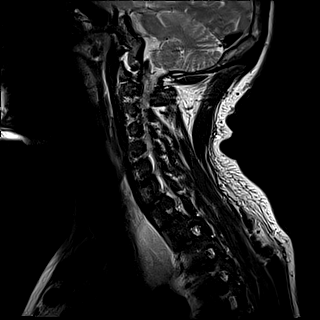
[im 7/14]
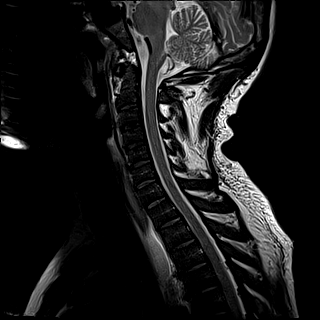
[im 10/14]
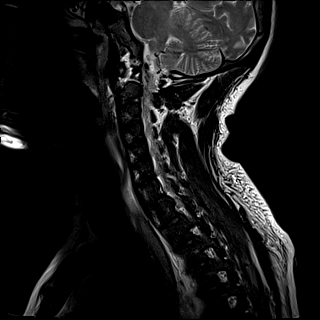
[im 14/14]
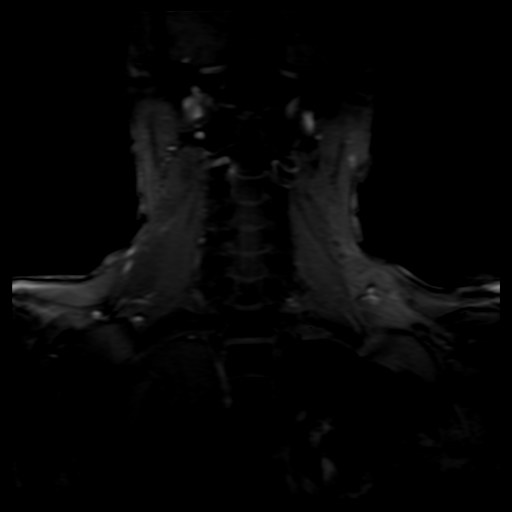

[Series 5002: T1 · sagittal · 3.0mm · 0.68mm/px · 6 of 14 slices shown]
[im 1/14]
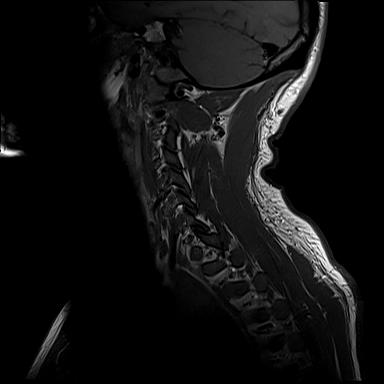
[im 3/14]
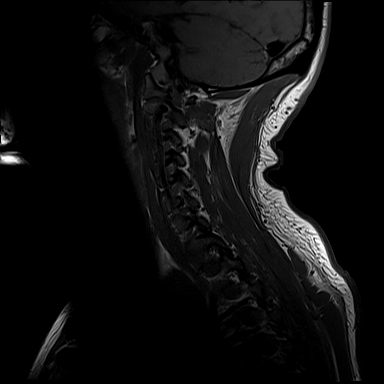
[im 6/14]
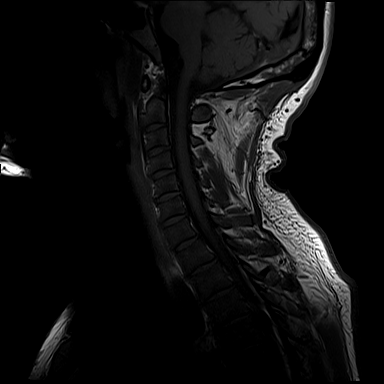
[im 8/14]
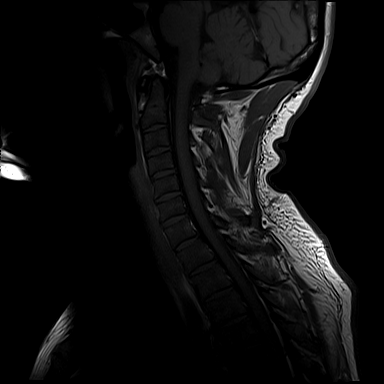
[im 11/14]
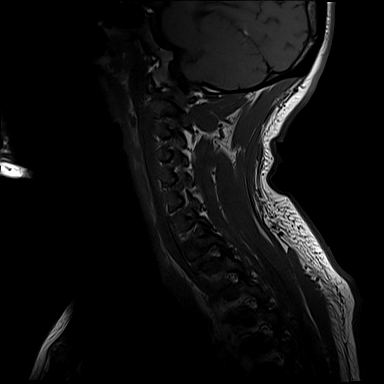
[im 14/14]
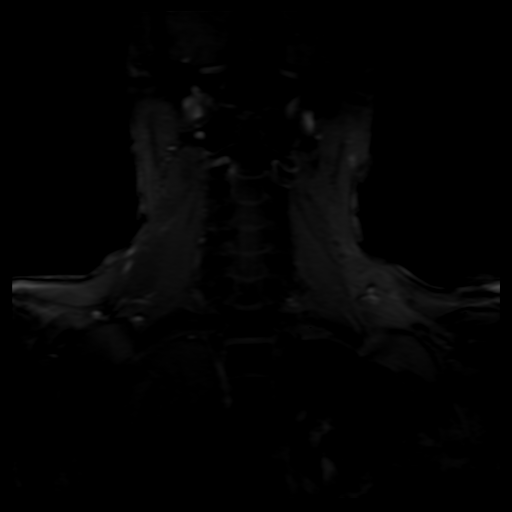

[Series 5004: t2_sag_fatsat__fil_1 · sagittal · 3.0mm · 1.02mm/px · 6 of 14 slices shown]
[im 1/14]
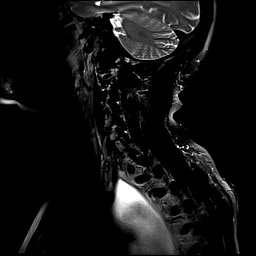
[im 3/14]
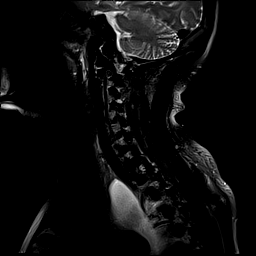
[im 6/14]
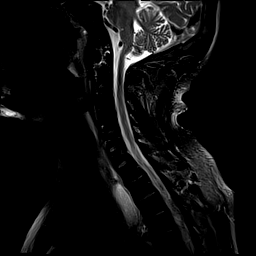
[im 8/14]
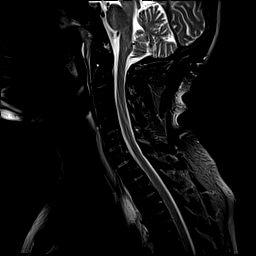
[im 11/14]
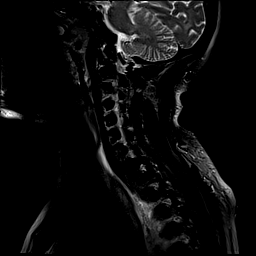
[im 14/14]
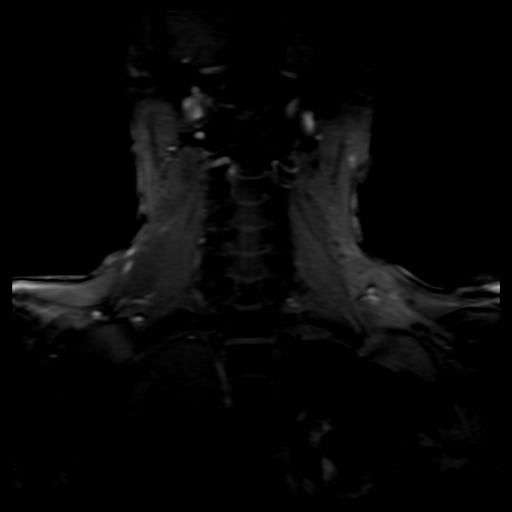

[Series 5006: t2_medic 2d_ax_p2_fil_1 · axial · 3.0mm · 0.35mm/px · z∈[-81,-23]mm · 5 of 26 slices shown]
[im 1/26]
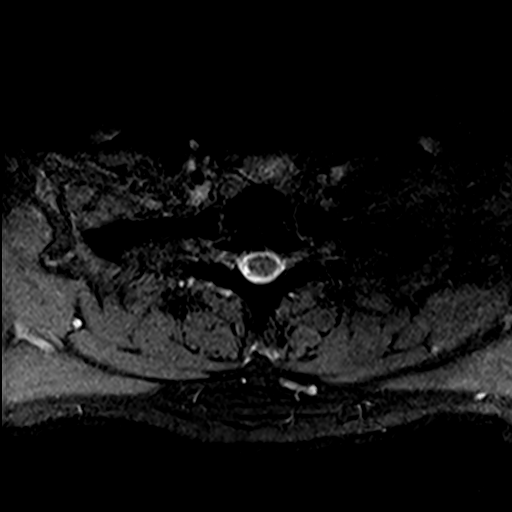
[im 6/26]
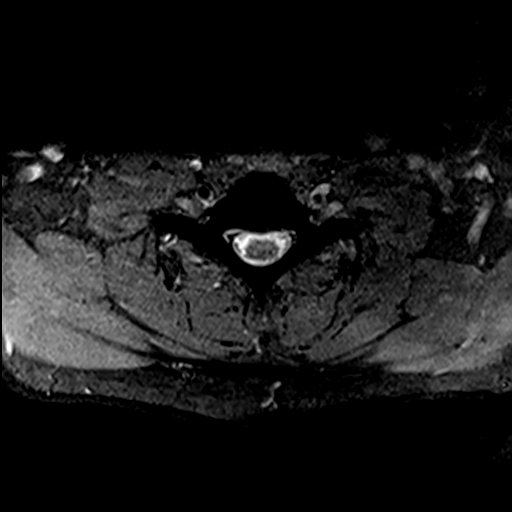
[im 8/26]
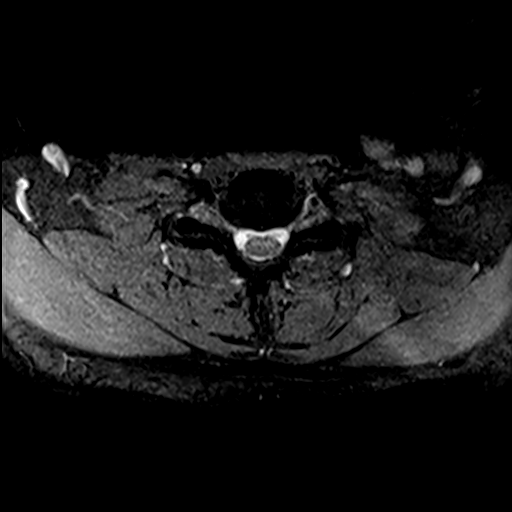
[im 11/26]
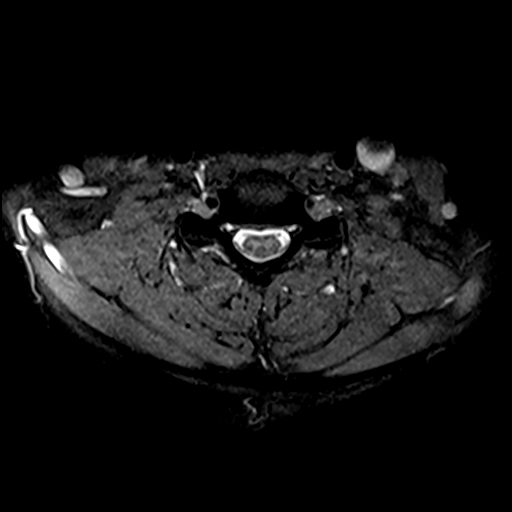
[im 16/26]
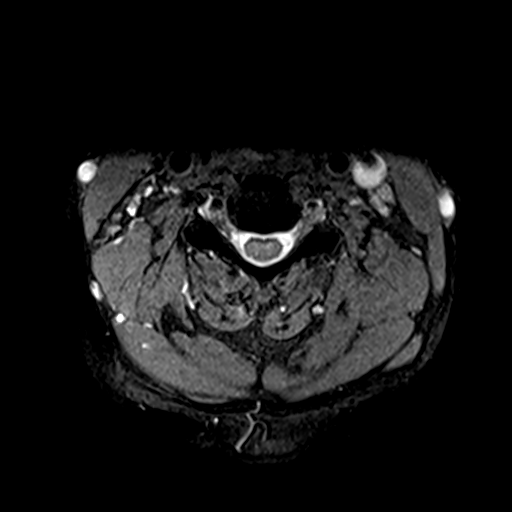

[Series 5008: T2 · axial · 3.0mm · 0.70mm/px · z∈[-81,+113]mm · 8 of 24 slices shown (2 of 3)]
[im 1/24]
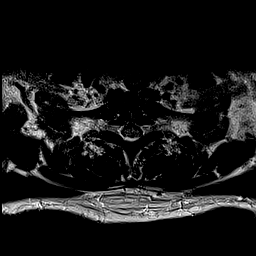
[im 3/24]
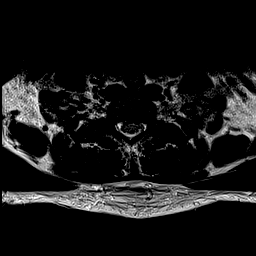
[im 8/24]
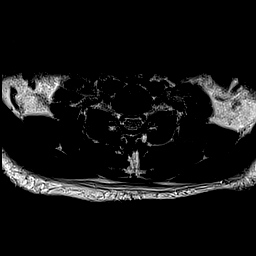
[im 11/24]
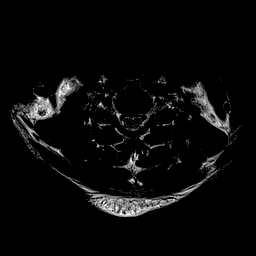
[im 13/24]
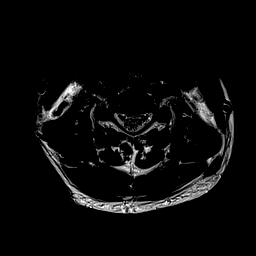
[im 16/24]
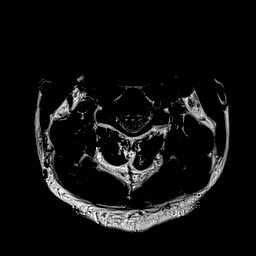
[im 21/24]
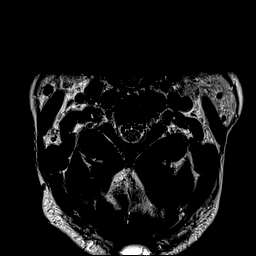
[im 24/24]
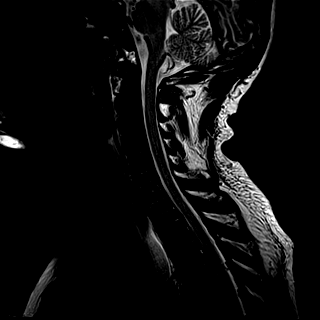

[Series 5012: T2 · axial · 3.0mm · 0.70mm/px · z∈[-81,+113]mm · 8 of 25 slices shown (3 of 3)]
[im 1/25]
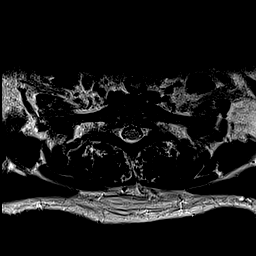
[im 3/25]
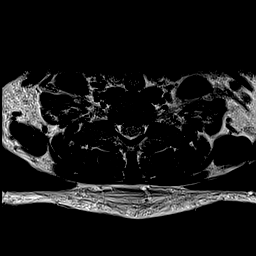
[im 9/25]
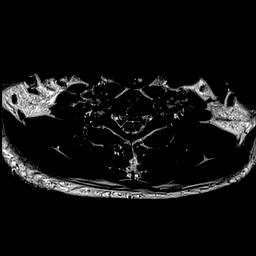
[im 11/25]
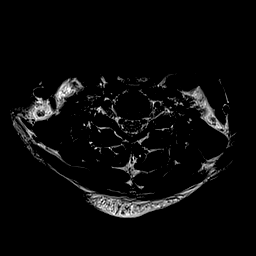
[im 14/25]
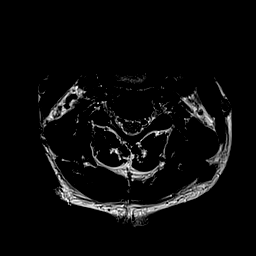
[im 17/25]
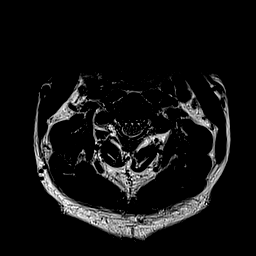
[im 22/25]
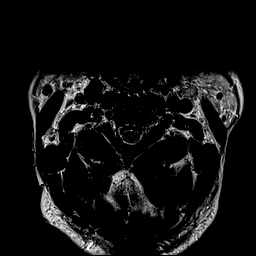
[im 25/25]
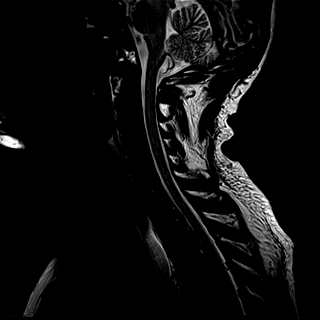

[38 of 48 positions shown; findings below may reference images not displayed]

FINDINGS: There is normal alignment.  Homogeneous intermediate T1 signal in the
cervical and upper thoracic spine likely related to patient's renal failure.
 Size and signal of the spinal cord is within normal limits.  No cerebellar
ectopia

C2-C3: No significant disc bulge or neuroforaminal narrowing.

C3-C4: No significant disc bulge or neuroforaminal narrowing.

C4-C5: No significant disc bulge or neuroforaminal narrowing.

C5-C6: No significant disc bulge or neuroforaminal narrowing.

C6-C7: No significant disc bulge or neuroforaminal narrowing.

C7-T1: No significant disc bulge or neuroforaminal narrowing.

There is a right pleural effusion which is incompletely visualized, but seen
on prior chest radiograph 02/17/2011.
IMPRESSION: No posterior disc bulge or neuroforaminal narrowing.

## 2012-08-22 ENCOUNTER — Emergency Department: Payer: Self-pay | Admitting: Emergency Medicine

## 2012-08-22 LAB — COMPREHENSIVE METABOLIC PANEL
Albumin: 3.4 g/dL (ref 3.4–5.0)
Alkaline Phosphatase: 128 U/L (ref 50–136)
BUN: 21 mg/dL — ABNORMAL HIGH (ref 7–18)
Bilirubin,Total: 0.3 mg/dL (ref 0.2–1.0)
Chloride: 102 mmol/L (ref 98–107)
Co2: 32 mmol/L (ref 21–32)
Creatinine: 8.66 mg/dL — ABNORMAL HIGH (ref 0.60–1.30)
EGFR (Non-African Amer.): 7 — ABNORMAL LOW
Glucose: 89 mg/dL (ref 65–99)
Osmolality: 287 (ref 275–301)
Potassium: 3.4 mmol/L — ABNORMAL LOW (ref 3.5–5.1)
SGPT (ALT): 12 U/L (ref 12–78)

## 2012-08-22 LAB — CK TOTAL AND CKMB (NOT AT ARMC)
CK, Total: 122 U/L (ref 35–232)
CK-MB: <0.5 ng/mL — ABNORMAL LOW (ref 0.5–3.6)

## 2012-08-22 LAB — CBC
HCT: 34.5 % — ABNORMAL LOW (ref 40.0–52.0)
MCH: 29 pg (ref 26.0–34.0)
MCV: 93 fL (ref 80–100)
Platelet: 137 10*3/uL — ABNORMAL LOW (ref 150–440)
RDW: 16.1 % — ABNORMAL HIGH (ref 11.5–14.5)

## 2012-08-22 LAB — TROPONIN I: Troponin-I: <0.02 ng/mL

## 2012-10-21 ENCOUNTER — Ambulatory Visit: Payer: Medicare Other | Admitting: Cardiovascular Disease

## 2012-10-26 IMAGING — CR DG CHEST 2V
1 series · 3 of 3 positions shown · non-contrast
Comparison: none

REASON FOR EXAM: sob and cough
COMMENTS:

[Series 1: view not recorded · 0.17mm/px · 3 of 3 slices shown]
[im 1/3]
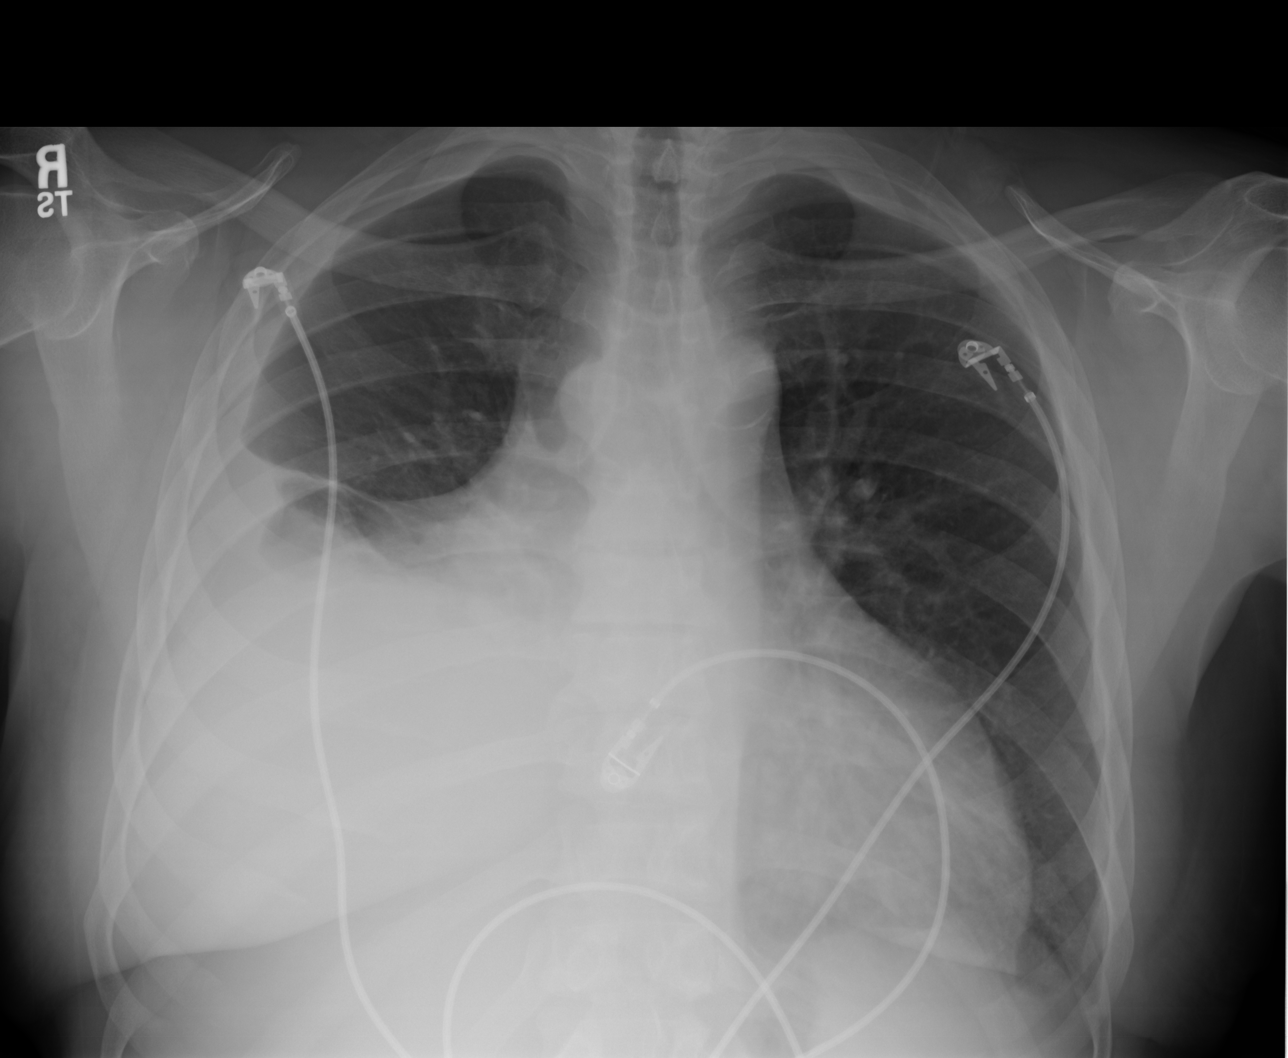
[im 2/3]
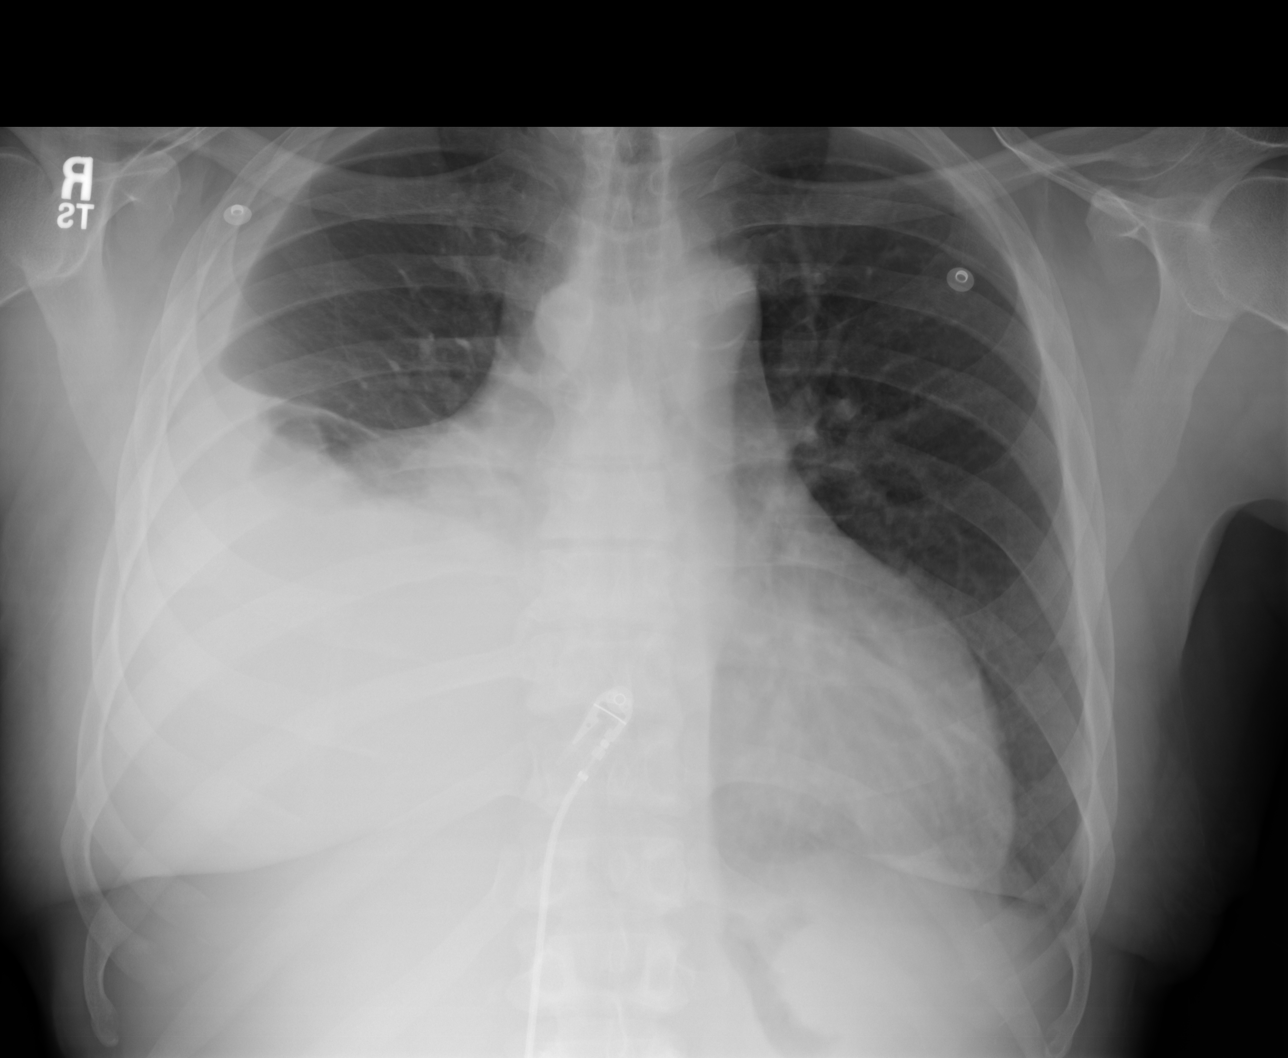
[im 3/3]
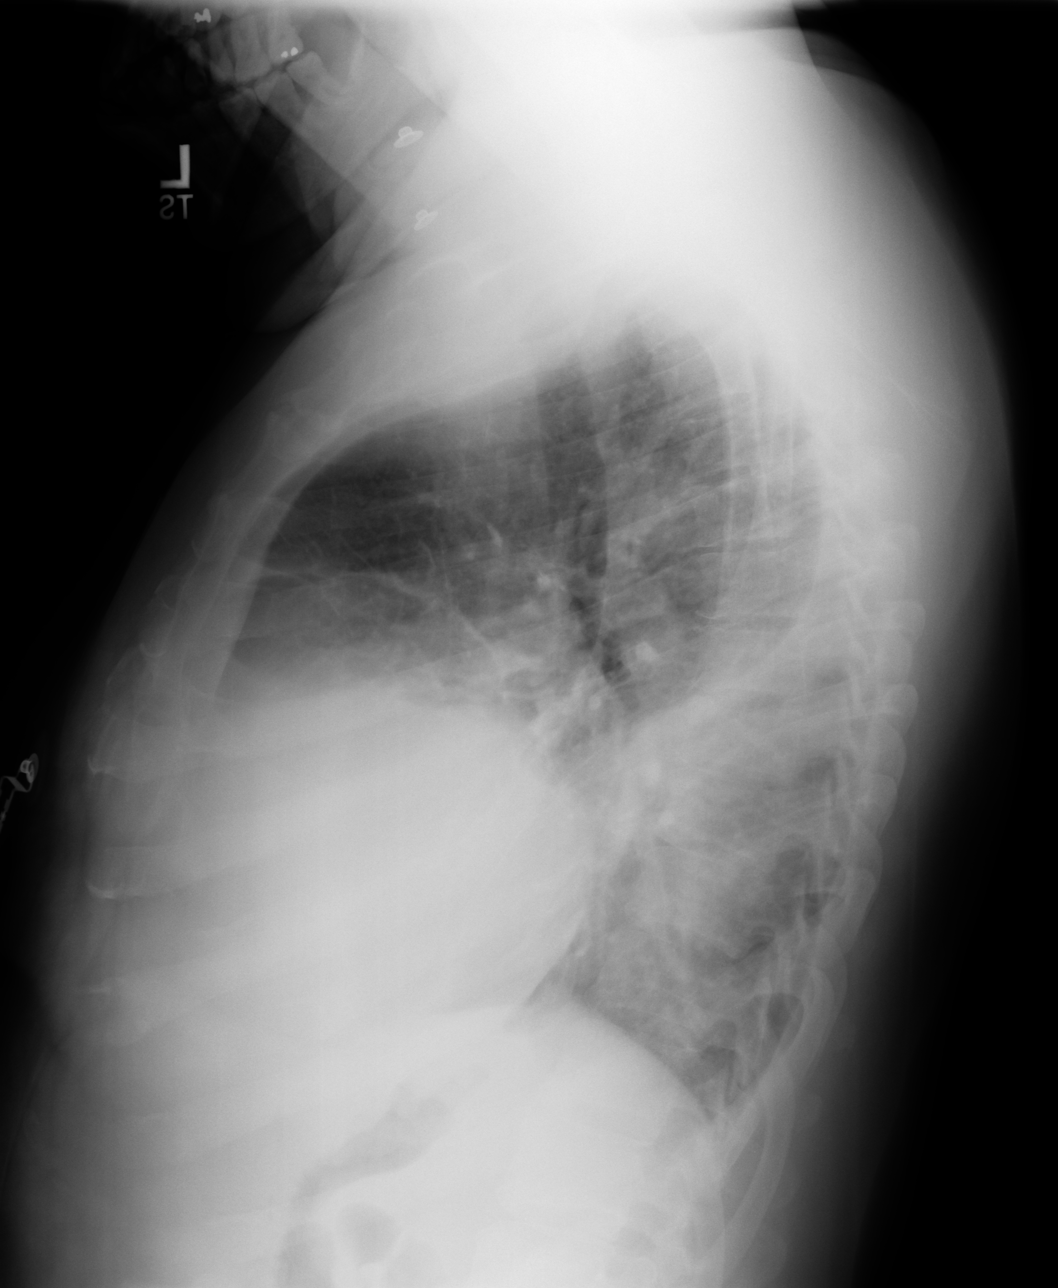

[3 of 3 positions shown; findings below may reference images not displayed]

PROCEDURE:     DXR - DXR CHEST PA (OR AP) AND LATERAL  - June 20, 2011  [DATE]

RESULT:     Comparison is made to the prior exam of 02/17/2011.

There is increased density at the right base compatible with a large, right
pleural effusion. Underlying infiltrate, mass or atelectasis could be
present and obscured by the effusion. The right upper lobe is clear. The
left lung field is clear. The heart size is difficult to evaluate since the
right heart border is obscured. Nonetheless, the heart does not appear
appreciably changed in size as compared to the prior exam of 02/17/2011. No
pulmonary edema is seen. Monitoring electrodes are present.
IMPRESSION: There is opacification of approximately the lower half of
the right lung field consistent with a large recurrent right pleural
effusion. As noted above, underlying atelectasis, infiltrate or mass cannot
be excluded.

## 2012-10-26 IMAGING — CT CT CHEST W/O CM
1 series · 15 of 31 positions shown, 19 images · non-contrast
Comparison: None

REASON FOR EXAM: pleural effusion
COMMENTS:

PROCEDURE:     CT  - CT CHEST WITHOUT CONTRAST  - June 20, 2011 [DATE]
RESULT:     Indication: Pleural effusion
TECHNIQUE: Multiple axial images of the chest are obtained without
intravenous contrast.

[Series 2: soft tissue · axial · 0.74mm/px · z∈[-202,+112]mm · 15 of 69 slices shown, 19 images]
[im 3/69  mediastinal]
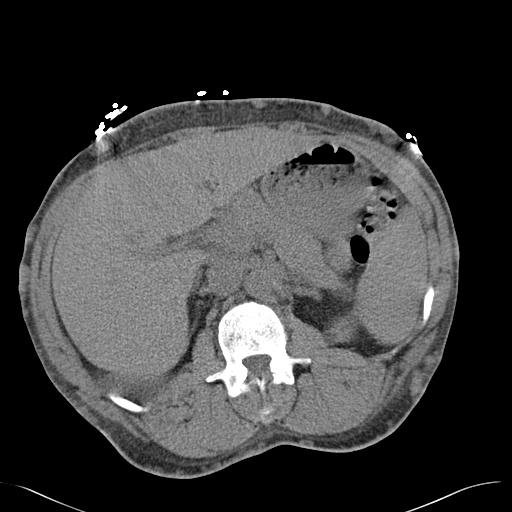
[im 3/69  lung]
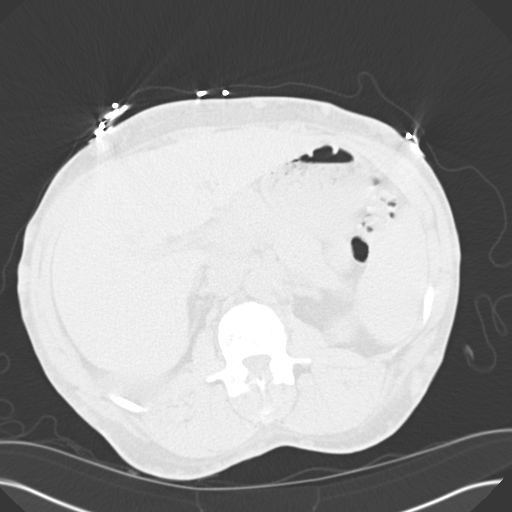
[im 8/69  lung]
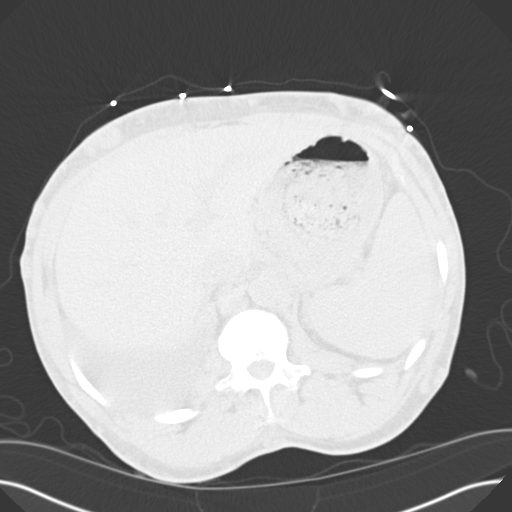
[im 13/69  lung]
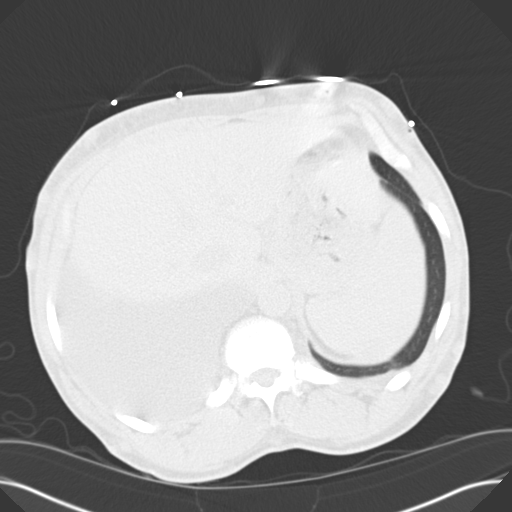
[im 16/69  lung]
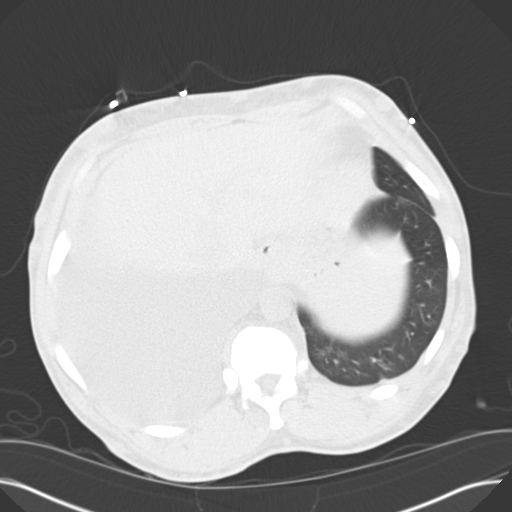
[im 21/69  mediastinal]
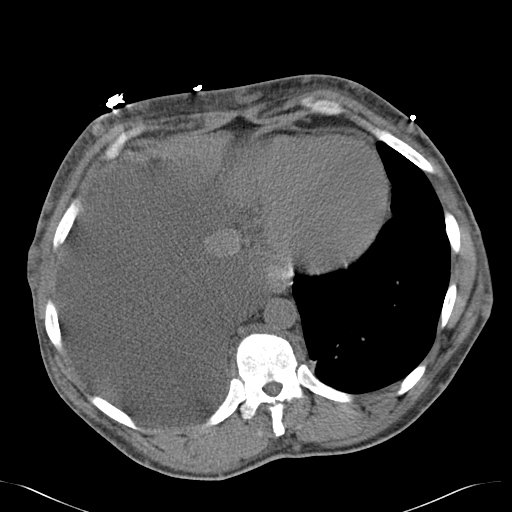
[im 21/69  lung]
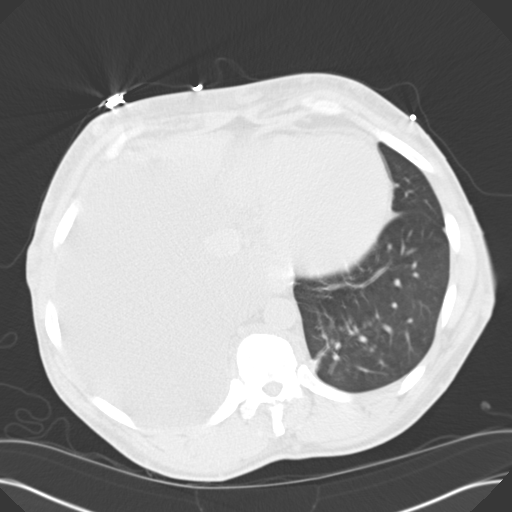
[im 26/69  lung]
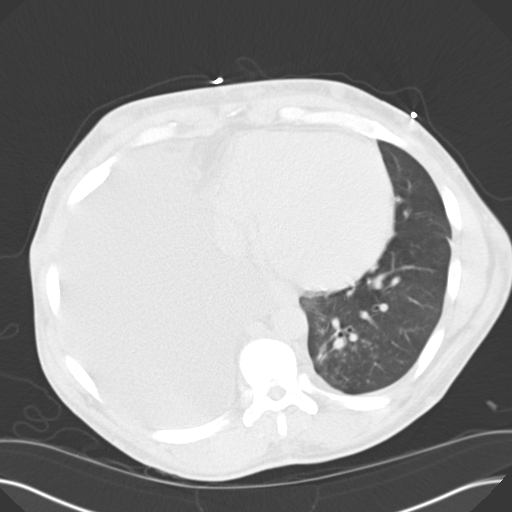
[im 31/69  lung]
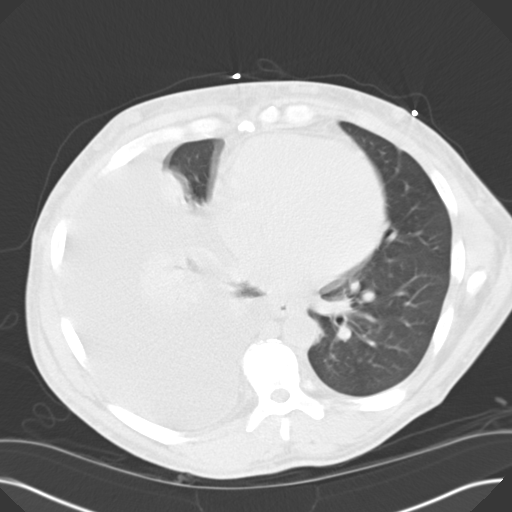
[im 36/69  lung]
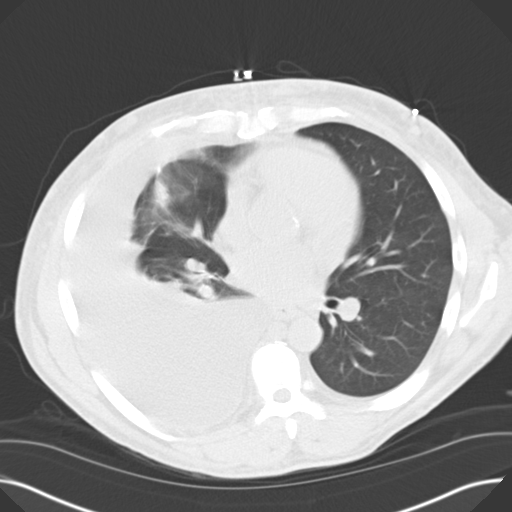
[im 38/69  mediastinal]
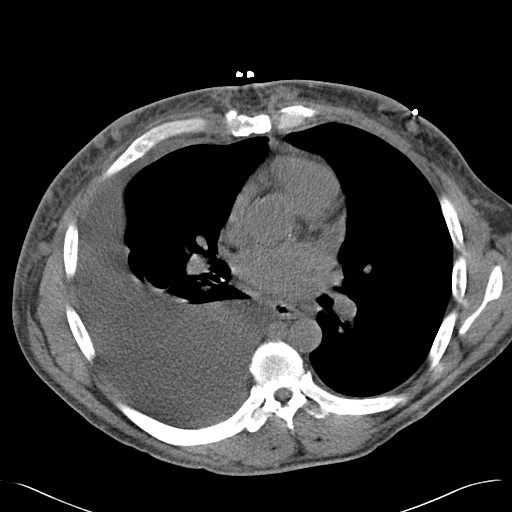
[im 38/69  lung]
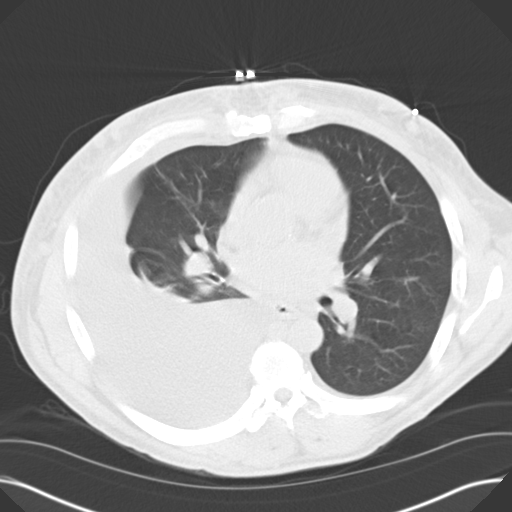
[im 43/69  lung]
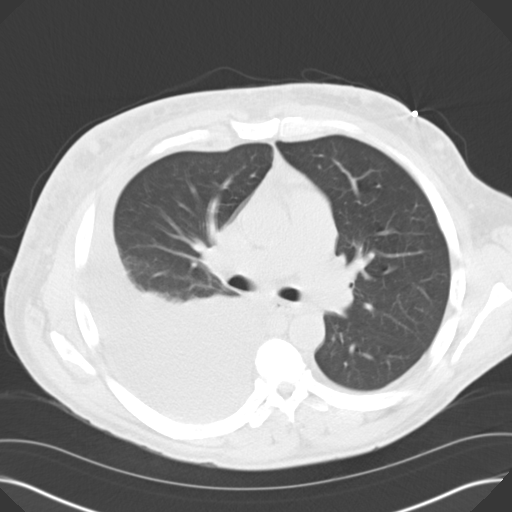
[im 48/69  lung]
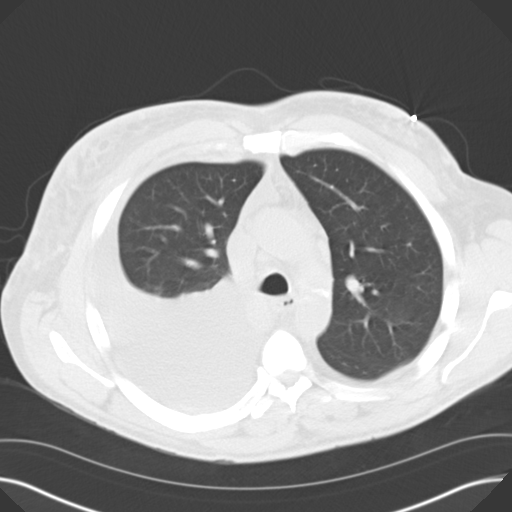
[im 53/69  lung]
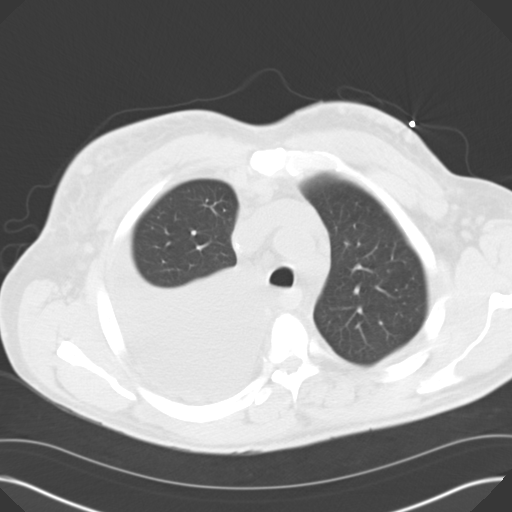
[im 56/69  mediastinal]
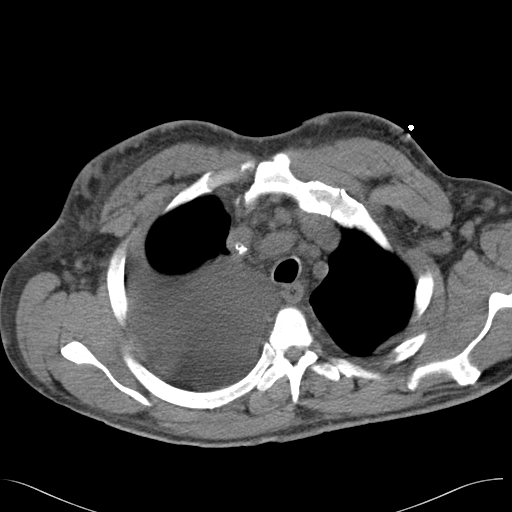
[im 56/69  lung]
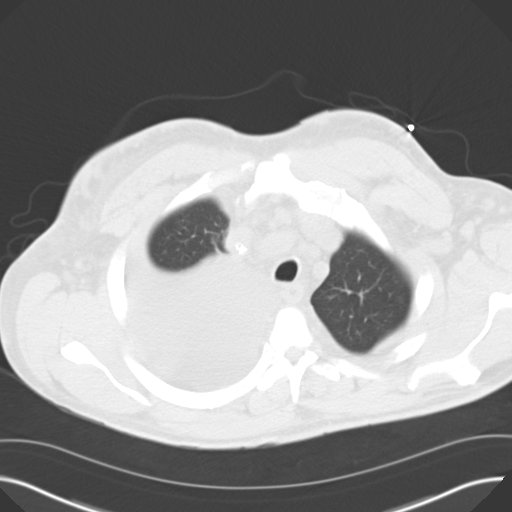
[im 61/69  lung]
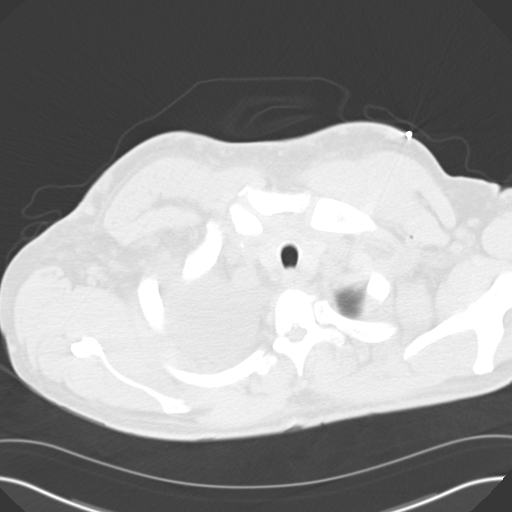
[im 66/69  lung]
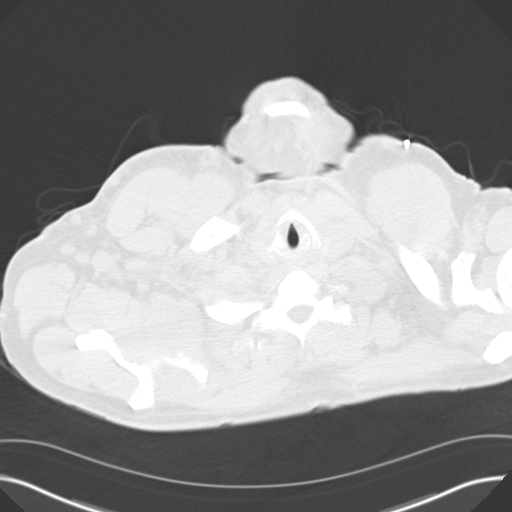

[15 of 31 positions shown; findings below may reference images not displayed]

FINDINGS: The central airways are patent. There is a moderate right pleural effusion.
There is associated compressive atelectasis. There is no left pleural
effusion. There is left lower lobe reticular nodular airspace disease
posteriorly which may be secondary to an infectious or inflammatory
etiology. There is no pneumothorax.

There are no pathologically enlarged axillary, hilar, or mediastinal lymph
nodes.

The heart size is normal. There is no pericardial effusion. The thoracic
aorta is normal in caliber.

Review of bone windows demonstrates no focal lytic or sclerotic lesions.

Limited noncontrast images of the upper abdomen were obtained. The adrenal
glands appear normal. The remainder of the visualized abdominal organs are
unremarkable.
IMPRESSION: 1. Moderate right pleural effusion.

2. Left lower lobe reticular nodular airspace disease posteriorly which may
be secondary to an infectious or inflammatory etiology.

## 2012-10-28 IMAGING — CT CT ABD-PELV W/O CM
1 of 2 series · 15 of 32 positions shown, 19 images · non-contrast
Comparison: none

REASON FOR EXAM: (1) source of effusion; (2) source of effusion
COMMENTS:

[Series 2: 3mm soft tissue · axial · 0.71mm/px · z∈[-470,-20]mm · 15 of 166 slices shown, 19 images]
[im 8/166  soft-tissue]
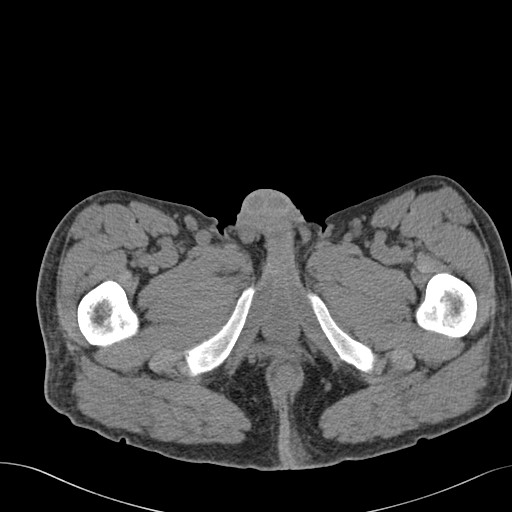
[im 8/166  bone]
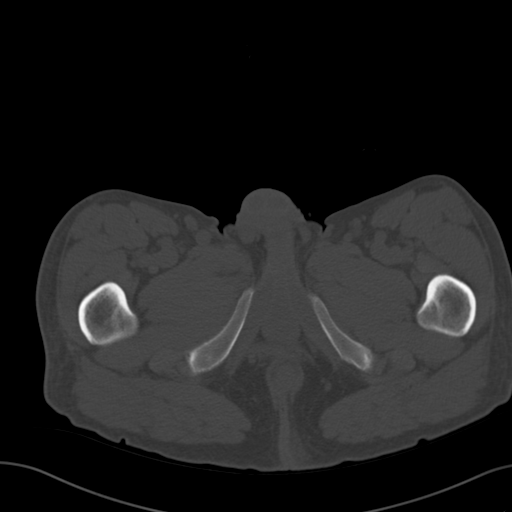
[im 23/166  soft-tissue]
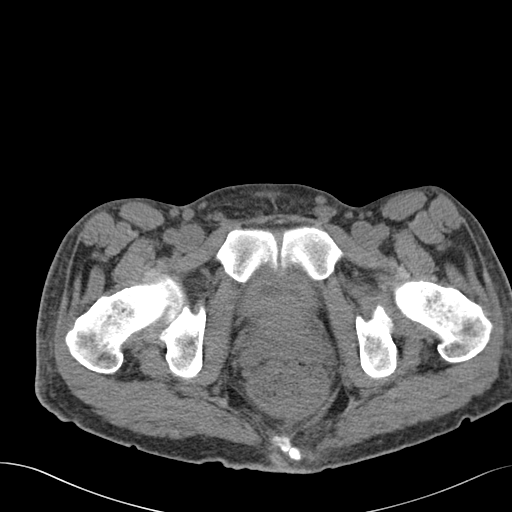
[im 38/166  soft-tissue]
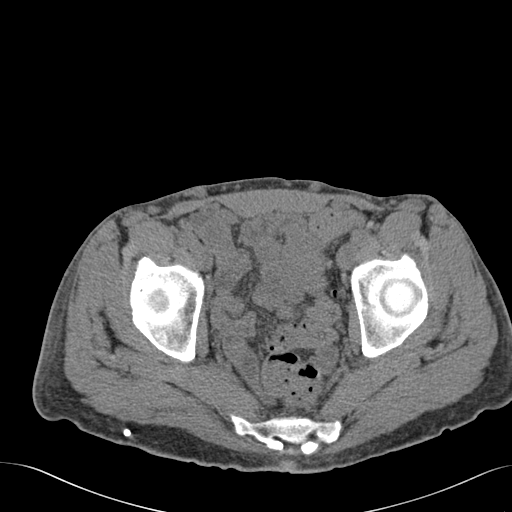
[im 46/166  soft-tissue]
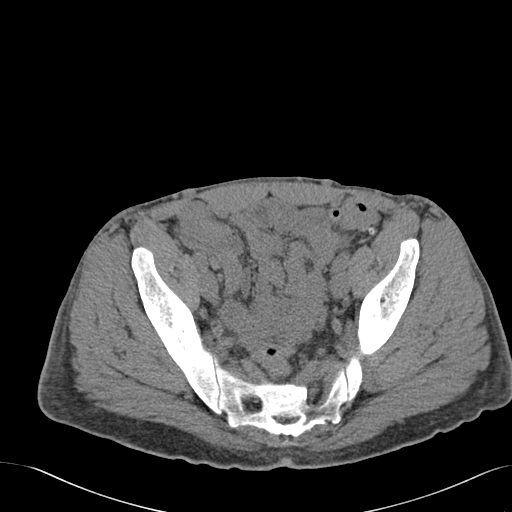
[im 61/166  soft-tissue]
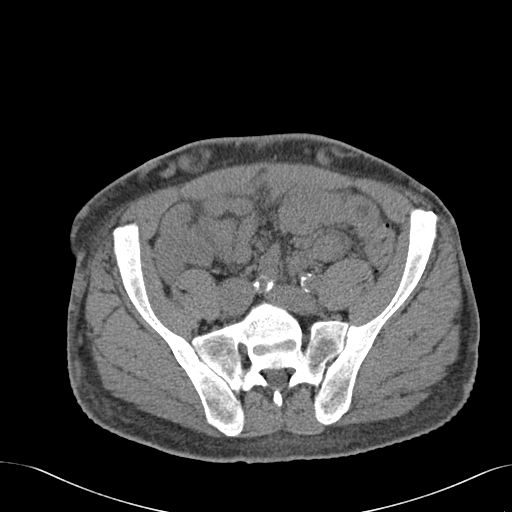
[im 68/166  soft-tissue]
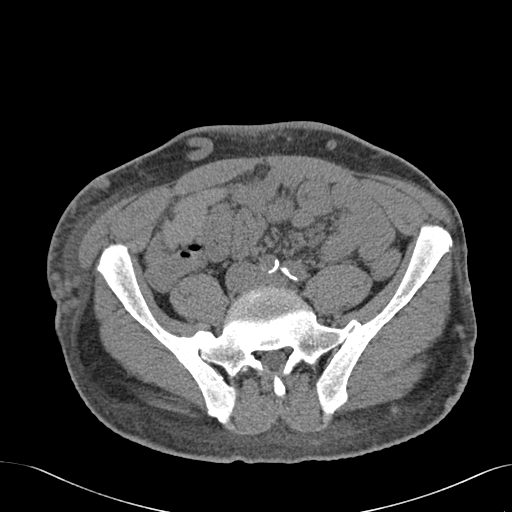
[im 83/166  soft-tissue]
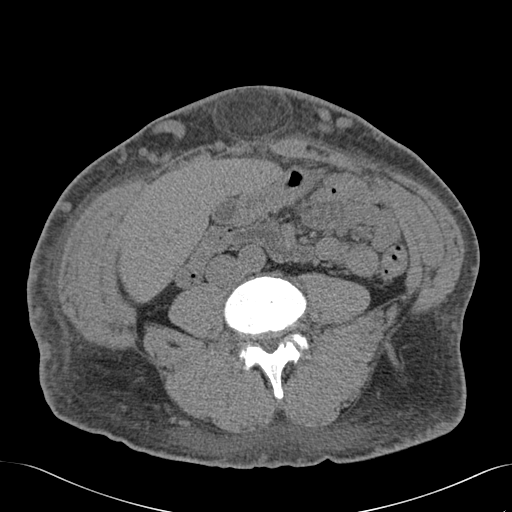
[im 98/166  soft-tissue]
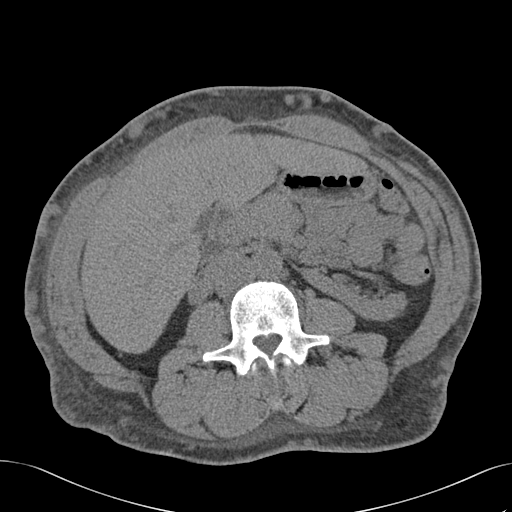
[im 106/166  soft-tissue]
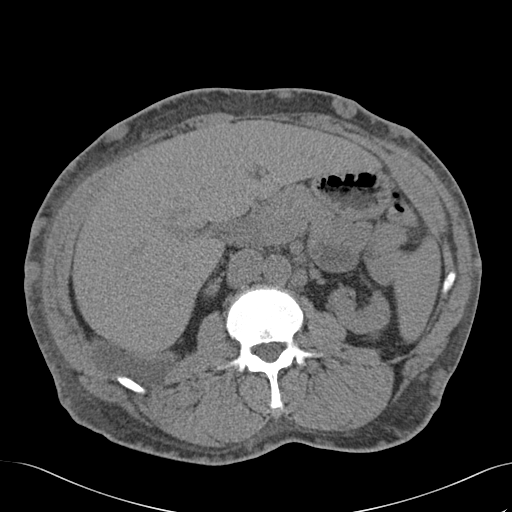
[im 106/166  bone]
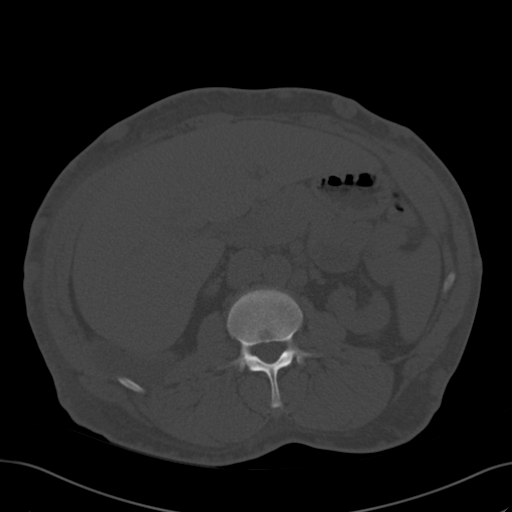
[im 121/166  soft-tissue]
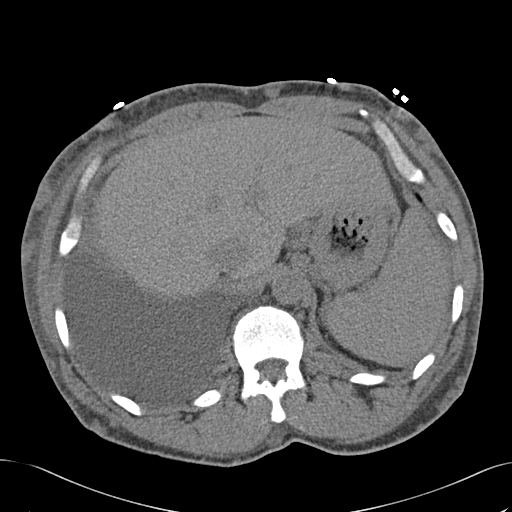
[im 128/166  soft-tissue]
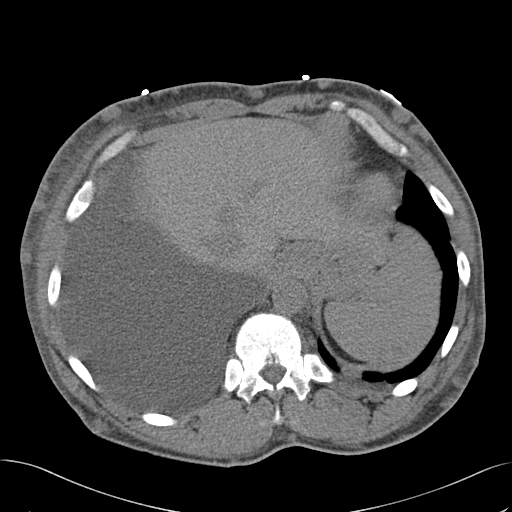
[im 136/166  lung]
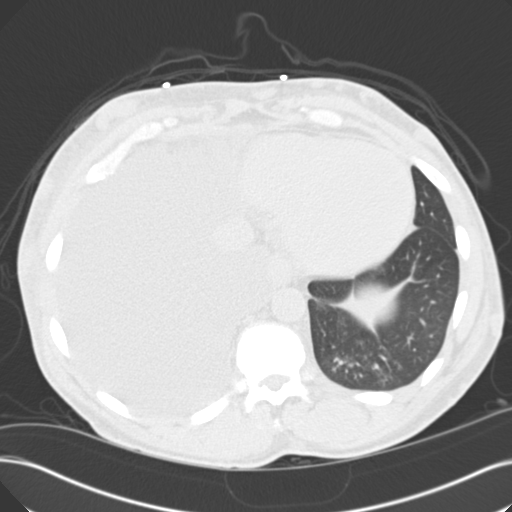
[im 143/166  soft-tissue]
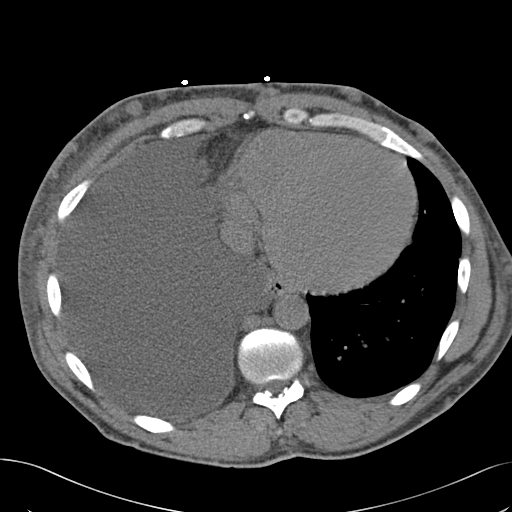
[im 143/166  lung]
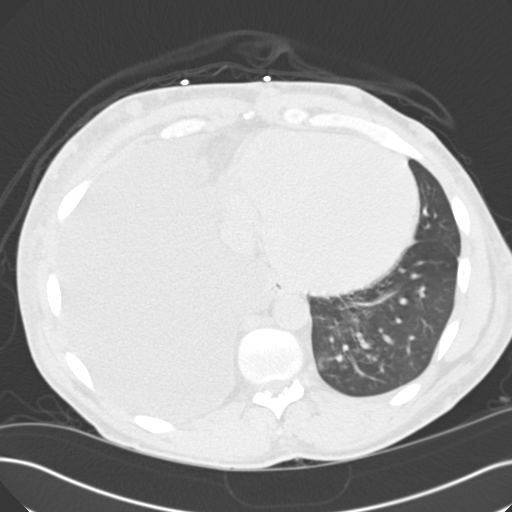
[im 151/166  lung]
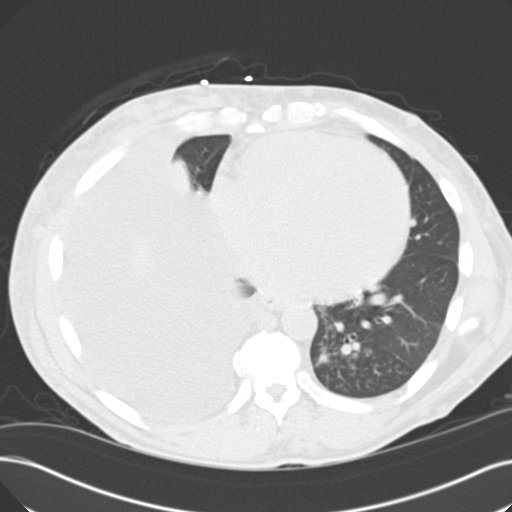
[im 158/166  soft-tissue]
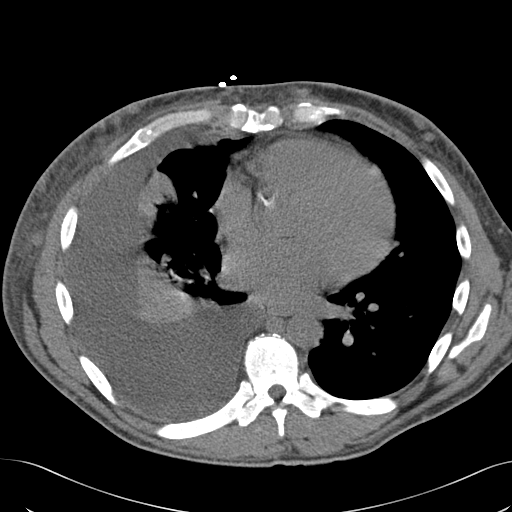
[im 158/166  lung]
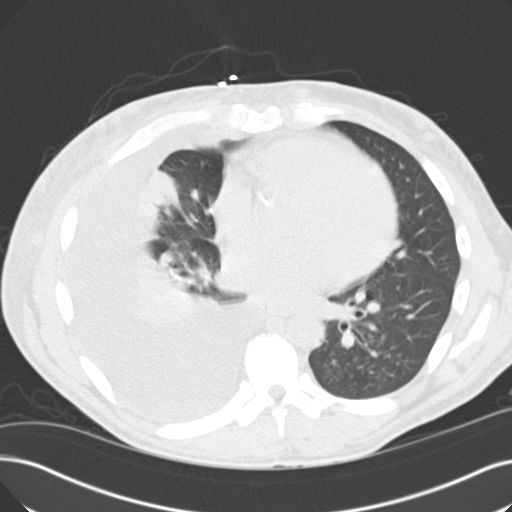

[15 of 32 positions shown; findings below may reference images not displayed]

PROCEDURE:     CT  - CT ABDOMEN AND PELVIS W[DATE]  [DATE]

RESULT:     Axial noncontrast CT scanning was performed through the abdomen
and pelvis at 3 mm intervals and slice thicknesses. The patient received no
oral contrast nor IV contrast. Review of multiplanar reconstructed images
was performed separately on the VIA monitor.

There is a large right pleural effusion. The cardiac chambers appear top
normal in size. There is no left pleural effusion. There is atelectatic
parenchyma in the right lower lobe.

Within the abdomen the liver exhibits no focal mass nor ductal dilation. The
gallbladder is adequately distended with no calcified stones. There is a
ventral hernia containing fat with a neck measuring approximately 2.2 cm in
diameter. The pancreas, spleen, nondistended stomach, adrenal glands, and
periaortic regions appear normal. There is a horseshoe kidney with the right
moiety being quite atrophic. There is no hydronephrosis. There are prominent
subcutaneous venous collateral which may reflect underlying portal
hypertension. There is also mildly increased density in the subcutaneous fat
diffusely which may reflect the patient's protein status. The unopacified
loops of small and large bowel exhibit no evidence of ileus or obstruction.
The lumbar vertebral bodies are preserved in height.
IMPRESSION: 1. There is a horseshoe kidney but there is no evidence of obstruction. A
punctate calcification in the atrophic right moiety is present.
2. There are prominent venous collaterals in the subcutaneous tissues
suggesting underlying portal hypertension.
3. The liver does not appear a shrunken nor irregular. The spleen is not
enlarged.
4. There is a large right pleural effusion and a small amount of right
basilar atelectasis.
5. I see no acute bowel abnormality on this noncontrast study.

## 2012-10-31 ENCOUNTER — Encounter: Payer: Self-pay | Admitting: Cardiovascular Disease

## 2012-10-31 IMAGING — CR DG CHEST POST BIOPSY - THORACENTESIS
1 series · 1 of 1 positions shown · non-contrast
Comparison: none

REASON FOR EXAM: post thoracentesis
COMMENTS:

PROCEDURE:     DXR - DXR CHEST 1 VIEW LABELLE SALHA BRACK TIGER  - June 25, 2011  [DATE]
RESULT:      Upright AP chest post thoracentesis reveals no evidence of
pneumothorax. A small to moderate residual right pleural effusion remains.
There is cardiomegaly with pulmonary vascular prominence.

[view not recorded]
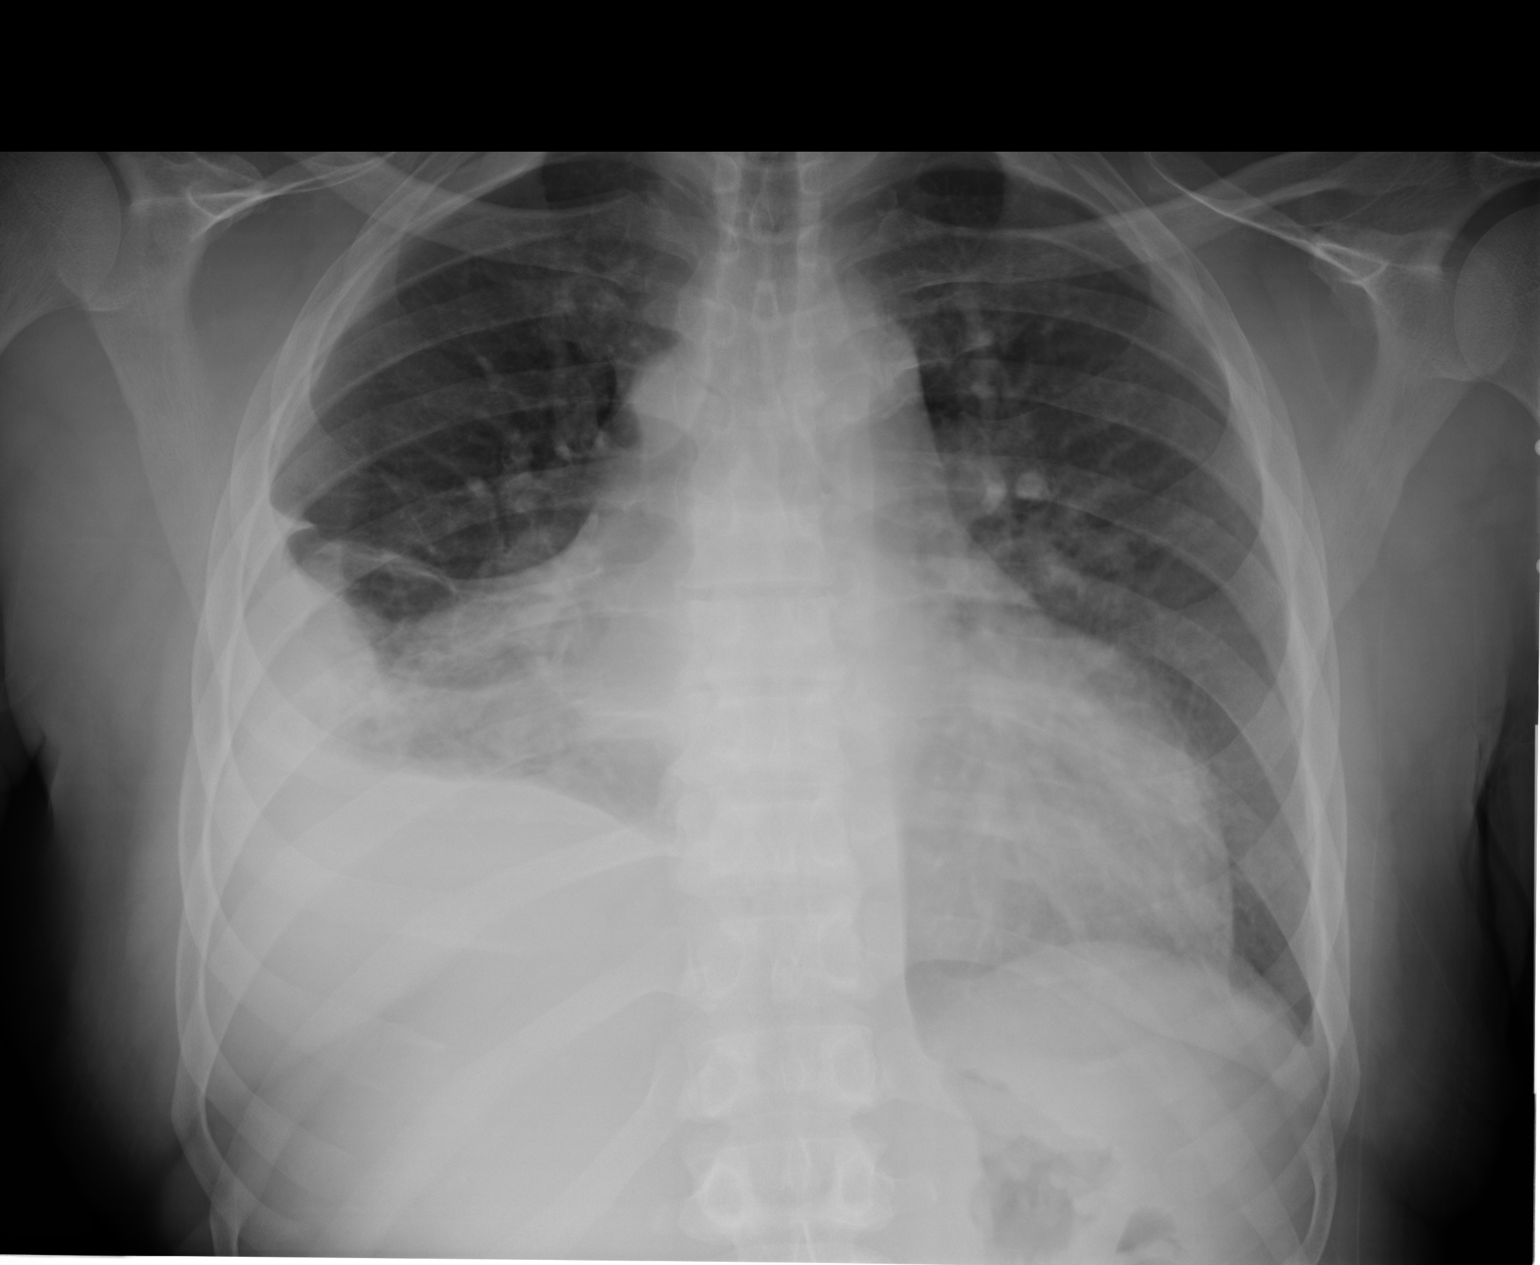

[1 of 1 positions shown; findings below may reference images not displayed]

IMPRESSION: No evidence of pneumothorax post thoracentesis.

## 2012-10-31 IMAGING — US US GUIDE NEEDLE - US [PERSON_NAME]
1 series · 1 of 1 positions shown · non-contrast
Comparison: none

REASON FOR EXAM: effusion
COMMENTS:

[Series 1: us guide needle - us (person_name) · 1 of 1 slices shown]
[im 1/1]
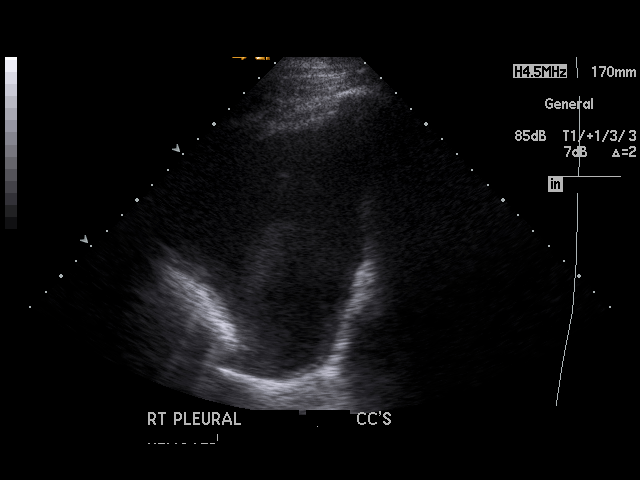

[1 of 1 positions shown; findings below may reference images not displayed]

PROCEDURE:     US  - US GUIDED THORACENTESIS RIGHT  - June 25, 2011  [DATE]

RESULT:     After discussing the risks and benefits of this procedure with
the patient, informed consent was obtained. The right chest was sterilely
prepped and draped. Following local anesthesia with 1% lidocaine, 2444 mL
thoracentesis was performed. Fluid was clear and straw-colored. There were
no complications.
IMPRESSION: Successful ultrasound-directed thoracentesis.

## 2012-12-02 IMAGING — CR DG CHEST 2V
1 series · 2 of 2 positions shown · non-contrast
Comparison: none

REASON FOR EXAM: chest  congestion
COMMENTS:

[Series 1: view not recorded · 0.17mm/px · 2 of 2 slices shown]
[im 1/2]
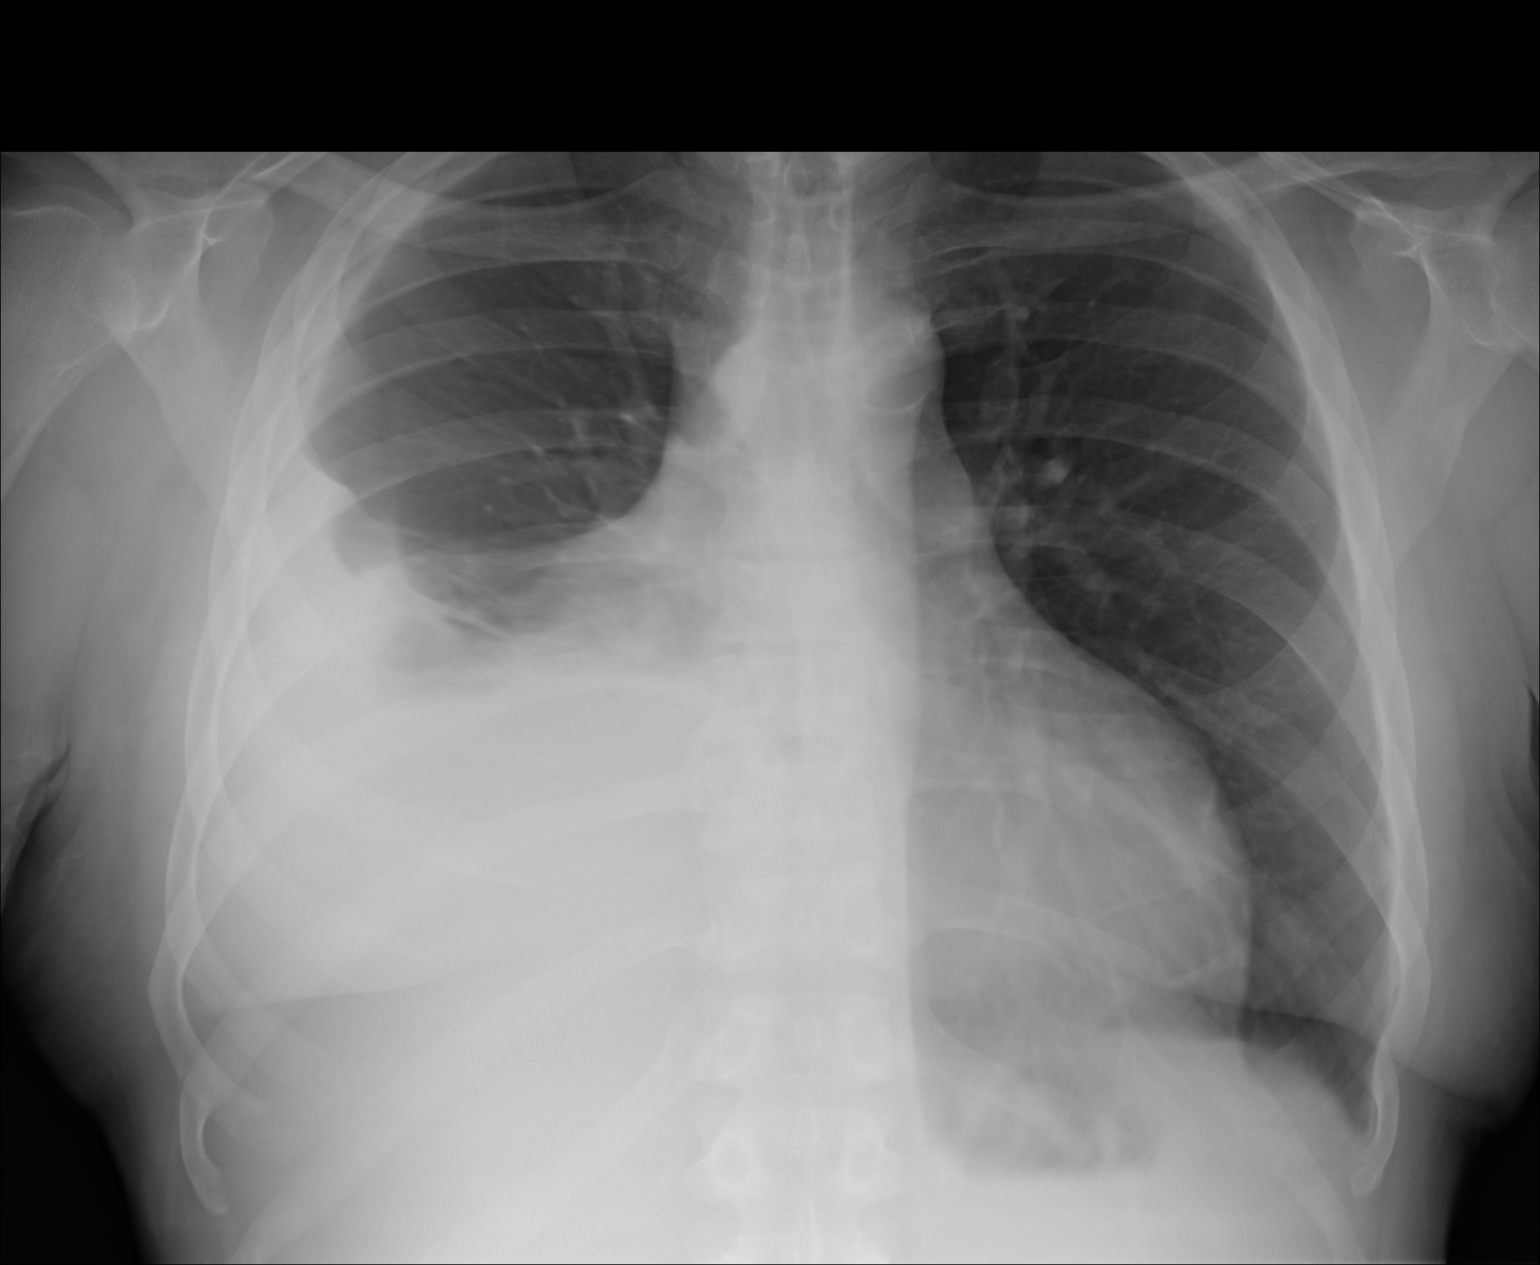
[im 2/2]
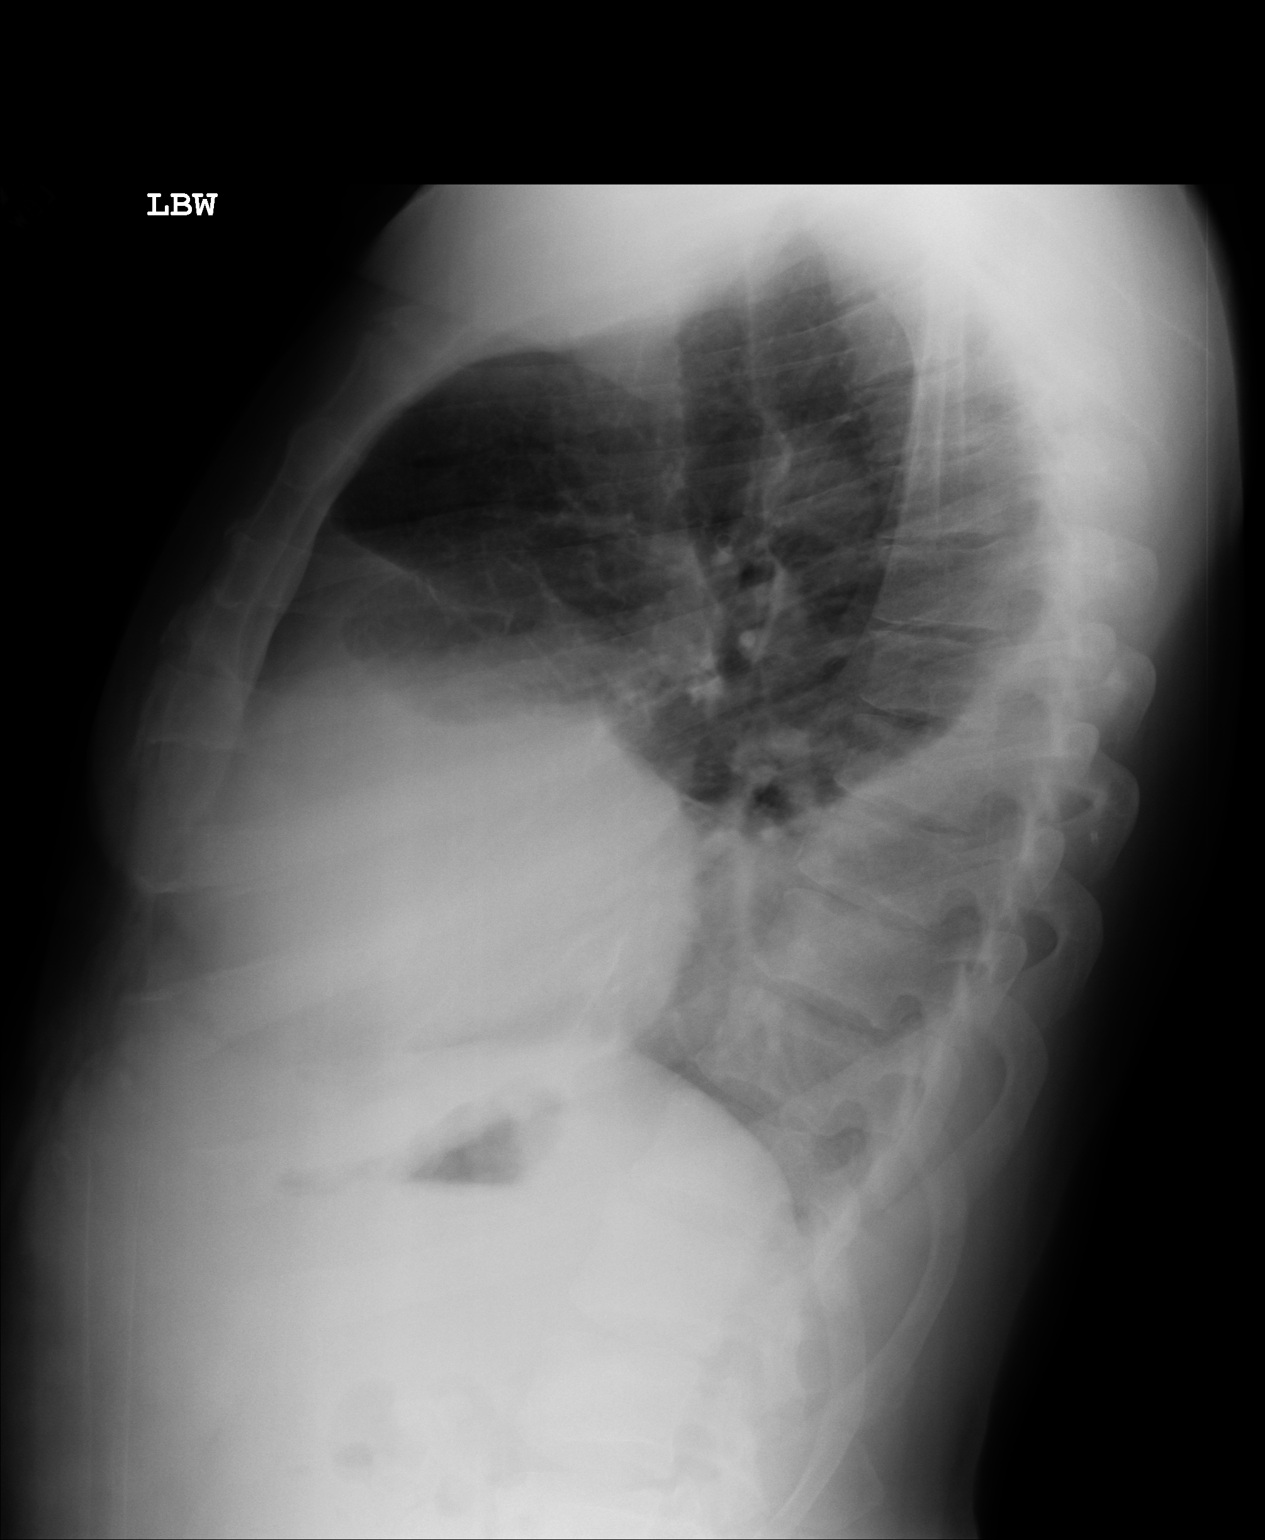

[2 of 2 positions shown; findings below may reference images not displayed]

PROCEDURE:     DXR - DXR CHEST PA (OR AP) AND LATERAL  - July 27, 2011 [DATE]

RESULT:     Comparison is made to the prior exam of 06/19/2010.

There is again noted a large, right pleural effusion that obscures the lower
half of the right lung field. The left lung field is clear. No pulmonary
edema is seen. The heart size is difficult to evaluate since the right heart
border is obscured. Overall, the heart does not appear appreciably changed
in size as compared to the prior exam.
IMPRESSION: 1.  There is observed a right pleural effusion that obscures the lower half
of the right lung field and is similar in appearance to that noted on the
exam of 06/20/2011.
[DATE].  The left lung field is clear.

## 2012-12-09 LAB — COMPREHENSIVE METABOLIC PANEL
Albumin: 3.6 g/dL (ref 3.4–5.0)
BUN: 59 mg/dL — ABNORMAL HIGH (ref 7–18)
Bilirubin,Total: 0.3 mg/dL (ref 0.2–1.0)
Chloride: 101 mmol/L (ref 98–107)
Creatinine: 14.95 mg/dL — ABNORMAL HIGH (ref 0.60–1.30)
EGFR (African American): 4 — ABNORMAL LOW
Glucose: 113 mg/dL — ABNORMAL HIGH (ref 65–99)
SGOT(AST): 13 U/L — ABNORMAL LOW (ref 15–37)
SGPT (ALT): 14 U/L (ref 12–78)
Total Protein: 8.2 g/dL (ref 6.4–8.2)

## 2012-12-09 LAB — CBC
HGB: 9.6 g/dL — ABNORMAL LOW (ref 13.0–18.0)
MCH: 27.8 pg (ref 26.0–34.0)
Platelet: 154 10*3/uL (ref 150–440)
RBC: 3.45 10*6/uL — ABNORMAL LOW (ref 4.40–5.90)

## 2012-12-10 ENCOUNTER — Inpatient Hospital Stay: Payer: Self-pay | Admitting: Internal Medicine

## 2012-12-10 LAB — PROTIME-INR
INR: 1
Prothrombin Time: 13.8 secs (ref 11.5–14.7)

## 2012-12-11 ENCOUNTER — Inpatient Hospital Stay (HOSPITAL_COMMUNITY)
Admission: EM | Admit: 2012-12-11 | Discharge: 2012-12-30 | DRG: 163 | Disposition: A | Discharge status: Home Health | Payer: Medicare Other | Source: Other Acute Inpatient Hospital | Attending: Thoracic Surgery (Cardiothoracic Vascular Surgery) | Admitting: Thoracic Surgery (Cardiothoracic Vascular Surgery)

## 2012-12-11 ENCOUNTER — Encounter (HOSPITAL_COMMUNITY): Payer: Self-pay | Admitting: Internal Medicine

## 2012-12-11 DIAGNOSIS — K922 Gastrointestinal hemorrhage, unspecified: Secondary | ICD-10-CM | POA: Diagnosis present

## 2012-12-11 DIAGNOSIS — F1910 Other psychoactive substance abuse, uncomplicated: Secondary | ICD-10-CM | POA: Insufficient documentation

## 2012-12-11 DIAGNOSIS — Z720 Tobacco use: Secondary | ICD-10-CM | POA: Insufficient documentation

## 2012-12-11 DIAGNOSIS — N186 End stage renal disease: Secondary | ICD-10-CM | POA: Diagnosis present

## 2012-12-11 DIAGNOSIS — D696 Thrombocytopenia, unspecified: Secondary | ICD-10-CM | POA: Diagnosis present

## 2012-12-11 DIAGNOSIS — J90 Pleural effusion, not elsewhere classified: Principal | ICD-10-CM | POA: Diagnosis present

## 2012-12-11 DIAGNOSIS — D62 Acute posthemorrhagic anemia: Secondary | ICD-10-CM | POA: Diagnosis present

## 2012-12-11 DIAGNOSIS — Z79899 Other long term (current) drug therapy: Secondary | ICD-10-CM

## 2012-12-11 DIAGNOSIS — I120 Hypertensive chronic kidney disease with stage 5 chronic kidney disease or end stage renal disease: Secondary | ICD-10-CM | POA: Diagnosis present

## 2012-12-11 DIAGNOSIS — E875 Hyperkalemia: Secondary | ICD-10-CM | POA: Diagnosis present

## 2012-12-11 DIAGNOSIS — I498 Other specified cardiac arrhythmias: Secondary | ICD-10-CM

## 2012-12-11 DIAGNOSIS — Z8679 Personal history of other diseases of the circulatory system: Secondary | ICD-10-CM

## 2012-12-11 DIAGNOSIS — Z992 Dependence on renal dialysis: Secondary | ICD-10-CM

## 2012-12-11 DIAGNOSIS — K5730 Diverticulosis of large intestine without perforation or abscess without bleeding: Secondary | ICD-10-CM

## 2012-12-11 DIAGNOSIS — N2581 Secondary hyperparathyroidism of renal origin: Secondary | ICD-10-CM | POA: Diagnosis present

## 2012-12-11 DIAGNOSIS — D638 Anemia in other chronic diseases classified elsewhere: Secondary | ICD-10-CM

## 2012-12-11 DIAGNOSIS — F17200 Nicotine dependence, unspecified, uncomplicated: Secondary | ICD-10-CM | POA: Diagnosis present

## 2012-12-11 DIAGNOSIS — I10 Essential (primary) hypertension: Secondary | ICD-10-CM

## 2012-12-11 DIAGNOSIS — D126 Benign neoplasm of colon, unspecified: Secondary | ICD-10-CM

## 2012-12-11 DIAGNOSIS — I959 Hypotension, unspecified: Secondary | ICD-10-CM | POA: Insufficient documentation

## 2012-12-11 DIAGNOSIS — R000 Tachycardia, unspecified: Secondary | ICD-10-CM | POA: Diagnosis present

## 2012-12-11 HISTORY — DX: Essential (primary) hypertension: I10

## 2012-12-11 HISTORY — DX: Dependence on renal dialysis: N18.6

## 2012-12-11 HISTORY — DX: Tobacco use: Z72.0

## 2012-12-11 HISTORY — DX: Dependence on renal dialysis: Z99.2

## 2012-12-11 HISTORY — DX: Anemia in other chronic diseases classified elsewhere: D63.8

## 2012-12-11 HISTORY — DX: Other psychoactive substance abuse, uncomplicated: F19.10

## 2012-12-11 HISTORY — DX: Secondary hyperparathyroidism of renal origin: N25.81

## 2012-12-11 LAB — BASIC METABOLIC PANEL
Co2: 29 mmol/L (ref 21–32)
Creatinine: 18.4 mg/dL — ABNORMAL HIGH (ref 0.60–1.30)
EGFR (African American): 3 — ABNORMAL LOW
EGFR (Non-African Amer.): 3 — ABNORMAL LOW
Osmolality: 301 (ref 275–301)

## 2012-12-11 LAB — CBC WITH DIFFERENTIAL/PLATELET
Basophil #: 0 10*3/uL (ref 0.0–0.1)
Basophil %: 1 %
Eosinophil %: 3.9 %
HCT: 23.1 % — ABNORMAL LOW (ref 40.0–52.0)
Lymphocyte #: 1.2 10*3/uL (ref 1.0–3.6)
Lymphocyte %: 27.8 %
MCHC: 31.9 g/dL — ABNORMAL LOW (ref 32.0–36.0)
Neutrophil #: 2.3 10*3/uL (ref 1.4–6.5)
RBC: 2.47 10*6/uL — ABNORMAL LOW (ref 4.40–5.90)
WBC: 4.4 10*3/uL (ref 3.8–10.6)

## 2012-12-11 LAB — MRSA PCR SCREENING: MRSA by PCR: NEGATIVE

## 2012-12-11 LAB — CLOSTRIDIUM DIFFICILE BY PCR

## 2012-12-11 LAB — PHOSPHORUS: Phosphorus: 6.7 mg/dL — ABNORMAL HIGH (ref 2.5–4.9)

## 2012-12-11 MED ORDER — PANTOPRAZOLE SODIUM 40 MG IV SOLR
40.0000 mg | Freq: Every day | INTRAVENOUS | Status: DC
  Administered 2012-12-12: 40 mg via INTRAVENOUS
  Filled 2012-12-11 (×2): qty 40

## 2012-12-11 NOTE — H&P (Signed)
Name: [name] [name] MRN: [id number] DOB: 28-Jun-1961    LOS: 0  PCCM RESIDENT ADMISSION NOTE  History of Present Illness: [name] [name] is a 52 y.o. African American male with a PMHx of ESRD on HD since 2007 (MWF - last session was on 12/08/2012 at [facility]), malignant HTN, polysubstance abuse (cocaine, marijuana, and alcohol), and chronic right sided pleural effusion who is admitted to [facility] on 12/11/2012 after transfer from [facility] with complaints of bright red blood per rectum x 8 episodes that started on the evening prior to admission. [name] [name] claims he felt like he had to pass gas around 8pm on 12/09/12 but when he went to the bathroom he ended up passing large bright red blood clots.  He subsequently continued to have bloody BMs every 15 minutes which led him to go to the hospital.  He denies any similar episodes, and has never had a colonoscopy.  He does not regularly follow up with a PCP.  He does regularly smoke marijuana and cigarettes and occasionally drinks alcohol but denies any other illicit drug use.  He denies regular NSAID use and does not know of any family history of colon cancer.    [name] [name] was admitted to [facility] for acute onset painless lower GI bleed.  He is noted to have several episodes of bleeding this AM while at [facility] with a Hb drop from 9.6 on admission to 6.5 and noted to be tachycardic.  He was evaluated by GI--Dr. Lucky Cowboy during his stay there and had a bleeding scan which showed active GI hemorrhage primarily in the distal transverse colon and flow.  Dr Lucky Cowboy planned on GI embolization today which was ultimately refused by [name] [name] who also requested transfer to [facility] and wanting further evaluation.    Of note, [name] [name] last HD session was Monday, 12/08/12.  He was supposed to get HD today but was not initiated. He normally follows with Dr. Holley Raring from Nephrology.  He continued to have BRBPR with several episodes  of bleeding reported this morning - last episode approximately 5PM, and was noted to be mildly tachycardic and with a progressively reduction in Hemoglobin to 6.5 this AM for which he was treated with 1 unit pRBC. He was transferred to [facility] for additional GI interventions.   Lines / Drains: PIV x 2 in left arm Right AV Fistula in upper arm  Cultures: None  Antibiotics: None   Tests / Events:  12/11/2012 - NM GI Blood Loss Study (at [facility]) - abnormal study showing active GI hemorrhage, appears to be in the distal transverse colon in region of splenic flexure.  12/11/2012 - EKG - sinus tachycardia, rate 101 bpm, normal axis, poor baseline, therefore difficult to ascertain ST/T wave abnormalities    Past Medical History  Diagnosis Date  . End-stage renal disease on hemodialysis   . Secondary hyperparathyroidism   . Hypertension   . Polysubstance abuse   . Anemia of chronic disease     BL Hgb ~10  . Tobacco abuse     Past Surgical History  Procedure Date  . Surgery for horseshoe kidney     Outpatient Medications: Medication Sig  . amLODipine (NORVASC) 10 MG tablet Take 10 mg by mouth daily as needed.   . calcium acetate (PHOSLO) 667 MG capsule Take 2,001 mg by mouth 3 (three) times daily with meals.  . cinacalcet (SENSIPAR) 90 MG tablet Take 180 mg by mouth  daily.    Allergies No Known Allergies  Family History Family History  Problem Relation Age of Onset  . Thyroid disease Mother   . Renal Disease      Social History  reports that he has been smoking Cigarettes.  He has a 10 pack-year smoking history. He does not have any smokeless tobacco history on file. He reports that he drinks alcohol. He reports that he uses illicit drugs.   Review Of Systems  11 points review of systems is negative with an exception of listed in HPI and as detailed below. Constitutional:  denies fever, chills, diaphoresis Admits to chronic decreased appetite, using  marijuana to help with this. appetite change and fatigue.  HEENT: denies congestion, sore throat, rhinorrhea.  Respiratory: denies SOB, DOE, cough, chest tightness, and wheezing.  Cardiovascular: admits to transient chest pain during attempt of dialysis today Denies palpitations and leg swelling.  Gastrointestinal: admits to nausea, blood in stools. Denies vomiting, abdominal pain, diarrhea, constipation.  Genitourinary: denies dysuria, urgency, frequency, hematuria, flank pain and difficulty urinating.  Musculoskeletal: admits to left wrist pain from IV line. Denies myalgias, back pain, joint swelling, arthralgias and gait problem.   Skin: denies pallor, rash and wound.  Neurological: denies dizziness, seizures, syncope, weakness, light-headedness, numbness and headaches.   Hematological: denies adenopathy, easy bruising, personal or family bleeding history.     Vital Signs: Temp:  [98.4 F (36.9 C)] 98.4 F (36.9 C) (01/02 2100) Pulse Rate:  [106-108] 106  (01/02 2200) Resp:  [16-17] 17  (01/02 2200) BP: (110-122)/(76-85) 122/76 mmHg (01/02 2200) SpO2:  [97 %] 97 % (01/02 2200) Weight:  [238 lb 1.6 oz (108 kg)] 238 lb 1.6 oz (108 kg) (01/02 2100)    Physical Examination: General: Vital signs reviewed and noted. Well-developed, well-nourished, in no acute distress; alert, appropriate and cooperative throughout examination.  Head: Normocephalic, atraumatic.  Eyes: PERRL, EOMI, No signs of anemia, muddy sclera.  Nose: Mucous membranes moist, not inflammed, nonerythematous.  Throat: Oropharynx nonerythematous, no exudate appreciated.   Neck: No deformities, masses, or tenderness noted. Supple, no JVD.  Lungs:  Normal respiratory effort. Clear to auscultation BL without crackles or wheezes. Decreased breath sounds noted at right base.  Heart: RRR. S1 and S2 normal without gallop, or rubs. (+) systolic murmur.  Abdomen:  BS normoactive. Soft, Nondistended, non-tender.  No masses or  organomegaly. Left lower quadrant healed horizontal scar noted (from prior horeshoe kidney surgery)  Extremities: No pretibial edema.  Neurologic: A&O X3, CN II - XII are grossly intact.   Skin: No visible rashes, scars.    Ventilator settings: Not currently intubated.   Labs and Imaging:   Reviewed Hemoglobin on admission to 9.6 at [facility], Hgb 6.5 AM 1/2 --> after which he received 1 unit pRBC.   Assessment and Plan:  PULMONARY  ASSESSMENT: Chronic right-sided pleural effusion - no indication of acute volume overload. PLAN:   - Check 2-view CXR - Continue to monitor   CARDIOVASCULAR  ASSESSMENT: 1) Sinus tachycardia, likely contributed by acute blood loss anemia on chronic AOCD, 2) CP - possible demand ischemia in setting of anemia versus atypical CP PLAN:  - Treatment of anemia as detailed below. - Continue on telemetry monitoring - Cycle cardiac enzymes in setting of complaints of chest pain (and acute anemia). - Repeat EKG.   RENAL  ASSESSMENT:  1) ESRD on HD (MWF, last session was on Monday, missed Wednesday session), 2) Hyperkalemia (K 5.8 at [facility]), 3) Hyperphosphatemia, 4)  Hypocalcemia PLAN:   - Will contact nephrology service to resume his HD, likely will need HD soon in setting of missed HD sessions. - Continue Sensipar and Cinacalcet. - Check STAT renal function panel, iPTH, vitamin D, ionized calcium, magnesium   GASTROINTESTINAL  ASSESSMENT:  1) Lower GI bleed (with NM Bleeding scan indicating active bleed likely from distal transverse colon in region of splenic flexure). Was unable to tolerate colonoscopy prep, therefore, has not had colonoscopy performed. S/P 1 unit pRBC. No passage of blood clots since 12/12/11 AM. PLAN:   - CBC q8h, with transfusion threshold of Hgb > 7 - Will contact IR service for consideration of embolectomy if progressive decline in Hemoglobin levels. - Will defer GI consult for now, unless patient starts to actively  bleed.   HEMATOLOGIC  ASSESSMENT:  1) Acute blood loss anemia (2/2 GI blood loss) on anemia of chronic disease PLAN:  - CBC q8h with transfusion threshold of Hgb > 7 - Check anemia panel. - Contact IR regarding possible need for embolectomy if worsening anemia. - Consider ddAVP.   INFECTIOUS DISEASE  ASSESSMENT:  N/A PLAN:  N/A   ENDOCRINE  ASSESSMENT:  1) Secondary hyperparathyroidism in setting of ESRD   PLAN:   - Check iPTH, vitamin D   NEUROLOGIC  ASSESSMENT:  N/A PLAN:  N/A   CLINICAL SUMMARY: [name] [name] is a 52 y.o. male with a PMHX of ESRD on HD (MWF at [place]), polysubstance abuse, AOCD (Hgb BL of 10) who was admitted to [facility] on transfer from [facility] on 12/11/2012 for evaluation of acute blood loss anemia thought 2/2 to lower GI source (with NM Bleeding scan confirming active hemorrhage). On admission, patient is hemodynamically stable and active bleeding has subsided over last 12 hours. Will therefore, monitor closely.   Best practices / Disposition: --> ICU status under PCCM --> Full code --> SCD DVT Px --> Protonix for GI Px --> Diet: Clear liquids   Annamarie Dawley, DO  12/11/2012, 10:14 PM  Case and plan of care discussed with attending physician, Dr. Simonne Maffucci.   The patient is critically ill with multiple organ systems failure and requires high complexity decision making for assessment and support, frequent evaluation and titration of therapies, application of advanced monitoring technologies and extensive interpretation of multiple databases. Critical Care Time devoted to patient care services described in this note is 45  minutes.   Attending:  I have seen and examined the patient with nurse practitioner/resident and agree with the note above.   ESRD, LGIB which appears slow as he is HD stable and his BRBPR has been minimal.  Has positive tagged RBC scan from [facility].  If he develops worsening bleeding we will call  IR.    Jillyn Hidden PCCM Pager: [phone] Cell: [phone] If no response, call [phone]

## 2012-12-12 ENCOUNTER — Inpatient Hospital Stay (HOSPITAL_COMMUNITY): Payer: Medicare Other

## 2012-12-12 DIAGNOSIS — N186 End stage renal disease: Secondary | ICD-10-CM | POA: Diagnosis present

## 2012-12-12 DIAGNOSIS — Z8679 Personal history of other diseases of the circulatory system: Secondary | ICD-10-CM

## 2012-12-12 DIAGNOSIS — J90 Pleural effusion, not elsewhere classified: Principal | ICD-10-CM

## 2012-12-12 DIAGNOSIS — Z87898 Personal history of other specified conditions: Secondary | ICD-10-CM | POA: Insufficient documentation

## 2012-12-12 LAB — CBC WITH DIFFERENTIAL/PLATELET
Basophils Relative: 0 % (ref 0–1)
Eosinophils Absolute: 0.1 10*3/uL (ref 0.0–0.7)
Eosinophils Relative: 2 % (ref 0–5)
Hemoglobin: 6.4 g/dL — CL (ref 13.0–17.0)
Hemoglobin: 7.9 g/dL — ABNORMAL LOW (ref 13.0–17.0)
Lymphocytes Relative: 24 % (ref 12–46)
Lymphs Abs: 1.3 10*3/uL (ref 0.7–4.0)
Lymphs Abs: 1.7 10*3/uL (ref 0.7–4.0)
MCH: 29.5 pg (ref 26.0–34.0)
MCV: 91.4 fL (ref 78.0–100.0)
Monocytes Absolute: 0.5 10*3/uL (ref 0.1–1.0)
Monocytes Relative: 7 % (ref 3–12)
Monocytes Relative: 9 % (ref 3–12)
Neutro Abs: 3.5 10*3/uL (ref 1.7–7.7)
Neutrophils Relative %: 65 % (ref 43–77)
RBC: 2.1 MIL/uL — ABNORMAL LOW (ref 4.22–5.81)
RBC: 2.7 MIL/uL — ABNORMAL LOW (ref 4.22–5.81)
WBC: 5.5 10*3/uL (ref 4.0–10.5)

## 2012-12-12 LAB — BODY FLUID CELL COUNT WITH DIFFERENTIAL
Lymphs, Fluid: 84 %
Neutrophil Count, Fluid: 0 % (ref 0–25)

## 2012-12-12 LAB — IRON AND TIBC
Saturation Ratios: 29 % (ref 20–55)
TIBC: 146 ug/dL — ABNORMAL LOW (ref 215–435)

## 2012-12-12 LAB — BASIC METABOLIC PANEL
BUN: 32 mg/dL — ABNORMAL HIGH (ref 6–23)
CO2: 30 mEq/L (ref 19–32)
Chloride: 98 mEq/L (ref 96–112)
GFR calc Af Amer: 6 mL/min — ABNORMAL LOW (ref >90)
Glucose, Bld: 83 mg/dL (ref 70–99)
Potassium: 4.1 mEq/L (ref 3.5–5.1)

## 2012-12-12 LAB — RENAL FUNCTION PANEL
Albumin: 2.9 g/dL — ABNORMAL LOW (ref 3.5–5.2)
BUN: 80 mg/dL — ABNORMAL HIGH (ref 6–23)
Calcium: 7.8 mg/dL — ABNORMAL LOW (ref 8.4–10.5)
Creatinine, Ser: 20.72 mg/dL — ABNORMAL HIGH (ref 0.50–1.35)
GFR calc non Af Amer: 2 mL/min — ABNORMAL LOW (ref >90)
Phosphorus: 8 mg/dL — ABNORMAL HIGH (ref 2.3–4.6)

## 2012-12-12 LAB — VITAMIN B12: Vitamin B-12: 332 pg/mL (ref 211–911)

## 2012-12-12 LAB — HEPATITIS B SURFACE ANTIGEN: Hepatitis B Surface Ag: NEGATIVE

## 2012-12-12 LAB — HEPATIC FUNCTION PANEL
Albumin: 2.8 g/dL — ABNORMAL LOW (ref 3.5–5.2)
Alkaline Phosphatase: 52 U/L (ref 39–117)
Bilirubin, Direct: 0.1 mg/dL (ref 0.0–0.3)
Total Bilirubin: 0.3 mg/dL (ref 0.3–1.2)

## 2012-12-12 LAB — VITAMIN D 25 HYDROXY (VIT D DEFICIENCY, FRACTURES): Vit D, 25-Hydroxy: 23 ng/mL — ABNORMAL LOW (ref 30–89)

## 2012-12-12 LAB — POTASSIUM: Potassium: 3.1 mEq/L — ABNORMAL LOW (ref 3.5–5.1)

## 2012-12-12 LAB — FOLATE: Folate: 9.4 ng/mL

## 2012-12-12 MED ORDER — DARBEPOETIN ALFA-POLYSORBATE 60 MCG/0.3ML IJ SOLN
INTRAMUSCULAR | Status: AC
  Administered 2012-12-12: 60 ug via INTRAVENOUS
  Filled 2012-12-12: qty 0.3

## 2012-12-12 MED ORDER — HYDRALAZINE HCL 20 MG/ML IJ SOLN
10.0000 mg | INTRAMUSCULAR | Status: DC | PRN
  Filled 2012-12-12: qty 2

## 2012-12-12 MED ORDER — CINACALCET HCL 30 MG PO TABS
180.0000 mg | ORAL_TABLET | Freq: Every day | ORAL | Status: DC
  Administered 2012-12-13 – 2012-12-29 (×15): 180 mg via ORAL
  Filled 2012-12-12 (×18): qty 6

## 2012-12-12 MED ORDER — RENA-VITE PO TABS
1.0000 | ORAL_TABLET | Freq: Every day | ORAL | Status: DC
  Administered 2012-12-12 – 2012-12-29 (×17): 1 via ORAL
  Filled 2012-12-12 (×19): qty 1

## 2012-12-12 MED ORDER — DOXERCALCIFEROL 4 MCG/2ML IV SOLN
INTRAVENOUS | Status: AC
  Administered 2012-12-12: 4.5 ug via INTRAVENOUS
  Filled 2012-12-12: qty 4

## 2012-12-12 MED ORDER — DARBEPOETIN ALFA-POLYSORBATE 60 MCG/0.3ML IJ SOLN
60.0000 ug | INTRAMUSCULAR | Status: DC
  Administered 2012-12-12 – 2012-12-26 (×4): 60 ug via INTRAVENOUS
  Filled 2012-12-12 (×3): qty 0.3

## 2012-12-12 MED ORDER — CINACALCET HCL 30 MG PO TABS
90.0000 mg | ORAL_TABLET | Freq: Two times a day (BID) | ORAL | Status: DC
  Administered 2012-12-12 (×2): 90 mg via ORAL
  Filled 2012-12-12 (×3): qty 3

## 2012-12-12 MED ORDER — DOXERCALCIFEROL 4 MCG/2ML IV SOLN
4.5000 ug | INTRAVENOUS | Status: DC
  Administered 2012-12-12 – 2012-12-29 (×9): 4.5 ug via INTRAVENOUS
  Filled 2012-12-12 (×8): qty 4

## 2012-12-12 NOTE — Progress Notes (Signed)
Pt tx to 6715 after report, VSS, recvd by RN.

## 2012-12-12 NOTE — Progress Notes (Signed)
[place] Progress Note Patient Name: [name] [name] DOB: Oct 05, 1961 MRN: [id number]  Date of Service  12/12/2012   HPI/Events of Note  Patient with lower GI bleed with Hgb of 6.4.  Is ESRD.    eICU Interventions  Plan: Transfuse 1 unit of pRBC Evaluate BMET now s/p prior blood transfusions CBC and BMET post transfusion to evaluate K level   Intervention Category Intermediate Interventions: Bleeding - evaluation and treatment with blood products  Alwaleed Obeso 12/12/2012, 12:50 AM

## 2012-12-12 NOTE — Progress Notes (Signed)
Pt profile: 51M smoker with ESRD, chronic R effusion transferred to [facility] 1/02 from [facility] with BRBPR, symptomatic anemia, hyperkalemia. He had undergone a tagged RBC scan @ [place] which showed apparent bleeding from tranverse colon near splenic flexure  Active problems: Principal Problem:  *Lower GI bleed Active Problems:  Acute blood loss anemia  Pleural effusion, right  Hyperkalemia  ESRD (end stage renal disease)  Sinus tachycardia, resolved with PRBC resuscitation  H/O: hypertension   Lines, Tubes, etc: none  Microbiology: none  Antibiotics:  none   Studies/Events:   Consults:  Renal GI  Best Practice: DVT: SCDs SUP: N/I Nutrition: reg Glycemic control: N/I Sedation/analgesia: N/I    Subj: No distress, no complaints  Obj: Filed Vitals:   12/12/12 1535  BP: 126/75  Pulse: 88  Temp: 98.4 F (36.9 C)  Resp: 20    Gen: WDWN in NAD HEENT: WNL Neck: No JVD Chest: dull throughout on R, markedly diminished BS on R, no wheeze Cardiac: RRR s M Abd: soft, NT, NABS Ext: warm, no edema  BMET    Component Value Date/Time   NA 141 12/12/2012 1215   K 4.1 12/12/2012 1215   CL 98 12/12/2012 1215   CO2 30 12/12/2012 1215   GLUCOSE 83 12/12/2012 1215   BUN 32* 12/12/2012 1215   CREATININE 10.70* 12/12/2012 1215   CALCIUM 8.4 12/12/2012 1215   GFRNONAA 5* 12/12/2012 1215   GFRAA 6* 12/12/2012 1215    CBC    Component Value Date/Time   WBC 5.5 12/12/2012 1205   RBC 2.70* 12/12/2012 1205   HGB 7.9* 12/12/2012 1205   HCT 24.0* 12/12/2012 1205   PLT 124* 12/12/2012 1205   MCV 88.9 12/12/2012 1205   MCH 29.3 12/12/2012 1205   MCHC 32.9 12/12/2012 1205   RDW 16.9* 12/12/2012 1205   LYMPHSABS 1.3 12/12/2012 1205   MONOABS 0.5 12/12/2012 1205   EOSABS 0.1 12/12/2012 1205   BASOSABS 0.0 12/12/2012 1205    CXR: Very large, partially loculated R effusion   IMPRESSION: Principal Problem:  *Lower GI bleed  Appears to have stopped or be very low grade  Hemodynamically stable  -GI consult  requested 1/03   Acute blood loss anemia  Improved after transfusion  -Cont erythropoietin  -Transfuse for hemodynamic instability or Hgb < 7.5 gm/dL   Pleural effusion, right  Appears to be chronic. Was tapped @ [facility] approx 15 months ago  Worrisome for malignancy in smoker but more likely uremic or volume related  -thoracentesis 1/03 (done)  -CT chest to r/o mass   Hyperkalemia  Resolved with HD   ESRD (end stage renal disease)  Mgmt per Renal   Sinus tachycardia, resolved with PRBC resuscitation   H/O: hypertension  -Will not start home meds yet  -PRN hydralazine ordered     Transfer to Tele bed. TRH to assume primary service as of 1/04 AM. Discussed with Dr Wynelle Cleveland. PCCM will f/u regarding pleural effusion and CT chest   Merton Border, MD ; [facility] [phone].  After 5:30 PM or weekends, call [phone]

## 2012-12-12 NOTE — Procedures (Signed)
Pt seen on HD.  Ap 120  Vp 200.  Received 2 units PRBC's.  BP lowish, will back off UF and give saline prn.

## 2012-12-12 NOTE — Progress Notes (Signed)
CRITICAL VALUE ALERT  Critical value received:  HGB 6.4   Date of notification:  12/12/12  Time of notification:  12:50  Critical value read back:yes  Nurse who received alert:  Charmayne Sheer  MD notified (1st page):  Deterding  Time of first page:  12:51  MD notified (2nd page):  Time of second page:  Responding MD:  Deterding  Time MD responded:  12:53

## 2012-12-12 NOTE — Progress Notes (Signed)
Pt transferred to unit via [place]. Pt is alert and oriented. Denies c/o pain. Pt oriented to room call bell and staff. Pt up to chair. Call bell within reach. Capable of voicing demands. Will continue to monitor

## 2012-12-12 NOTE — Consult Note (Signed)
[name] [name] is an 52 y.o. male referred by Dr Oliver Pila   Chief Complaint: ESRD, Sec Hpth, Anemia HPI: 52 yo BM with ESRD on HD  At  Hospital on MWF transferred from [place] to [facility] last night for lower GI bleed that started 12/09/12.  Hg dropped to 6.5.  He had a K of 6.5 last night.  Unclear why he was not properly dialyzed at [facility].  Last HD was monday  Past Medical History  Diagnosis Date  . End-stage renal disease on hemodialysis   . Secondary hyperparathyroidism   . Hypertension   . Polysubstance abuse   . Anemia of chronic disease     BL Hgb ~10  . Tobacco abuse     Past Surgical History  Procedure Date  . Surgery for horseshoe kidney     Family History  Problem Relation Age of Onset  . Thyroid disease Mother   . Renal Disease    ? Renal disease in grandmother  Social History:  reports that he has been smoking Cigarettes.  He has a 10 pack-year smoking history. He does not have any smokeless tobacco history on file. He reports that he drinks alcohol. He reports that he uses illicit drugs.  Allergies: No Known Allergies  Medications Prior to Admission  Medication Sig Dispense Refill  . AmLODIPine Besylate (NORVASC PO) Take 1 tablet by mouth daily.      . Calcium Acetate, Phos Binder, (PHOSLO PO) Take 667 mg by mouth 3 (three) times daily.      . cinacalcet (SENSIPAR) 90 MG tablet Take 180 mg by mouth daily.         Lab Results: UA: ND  Basename 12/11/12 2350  WBC 7.1  HGB 6.4*  HCT 19.7*  PLT 132*   BMET  Basename 12/11/12 2350  NA 139  K 6.5*  CL 97  CO2 24  GLUCOSE 80  BUN 80*  CREATININE 20.72*  CALCIUM 7.8*  PHOS 8.0*   LFT  Basename 12/11/12 2350  PROT 5.9*  ALBUMIN 2.9*2.8*  AST 6  ALT 5  ALKPHOS 52  BILITOT 0.3  BILIDIR 0.1  IBILI 0.2*   No results found.  ROS: No change in vision No SOB CO mild chest tightness, has refused cath in past No BRBPR since yest at 5pm No abd pain No edema No arthritic CO No new  neuropathic CO  PHYSICAL EXAM: Blood pressure 101/63, pulse 117, temperature 98.6 F (37 C), temperature source Oral, resp. rate 18, height 6\' 1"  (1.854 m), weight 101.1 kg (222 lb 14.2 oz), SpO2 98.00%. HEENT: PERRLA EOMI NECK:no jvd no nodes LUNGS:clear CARDIAC:Tachy, reg wo MRG ABD:+ BS NTND no HSM EXT:no edema  Rt UA AVG + bruit NEURO:CNI  M&SI Ox3 no asterixis  Assessment: 1. Hyperkalemia 2. Lower GI bleed 3. HTN 4. Sec HPTH 5. ESRD PLAN: 1. Emergent HD 2. Resume EPO and hectorol 3. Cont sensipar 4. Transfuse on HD 5. Recheck K later today 6. Start MVI   Chinedu Agustin T 12/12/2012, 6:35 AM

## 2012-12-12 NOTE — Procedures (Signed)
Right Thoracentesis Procedure Note  Pre-operative Diagnosis: pleural effusion  Post-operative Diagnosis: same  Indications: eval chronic pleural effusion  Procedure Details  Consent: Informed consent was obtained. Risks of the procedure were discussed including: infection, bleeding, pain, pneumothorax.  Under sterile conditions the patient was positioned. Betadine solution and sterile drapes were utilized.  1% buffered lidocaine was used to anesthetize the 9th rib space. Fluid was obtained without any difficulties and minimal blood loss.  A dressing was applied to the wound and wound care instructions were provided.   Findings 1500 ml of red-amber pleural fluid was obtained. A sample was sent to Pathology for cytology, chemistries, WBC count and differential GS and culture.  Complications:  None; patient tolerated the procedure well.          Condition: stable  Plan A follow up chest x-ray revealed reduction in size of effusion and no ptx Tylenol 650 mg. for pain.  Attending Attestation: I performed the procedure.  Merton Border, MD ; [facility] [phone].  After 5:30 PM or weekends, call [phone]

## 2012-12-12 NOTE — Progress Notes (Signed)
CRITICAL VALUE ALERT  Critical value received:  K+ 6.5  Date of notification:  12/12/12  Time of notification:  1:05  Critical value read back:yes  Nurse who received alert:  Charmayne Sheer  MD notified (1st page):  Deterding  Time of first page:  1:05  MD notified (2nd page):  Time of second page:  Responding MD:  Deterding  Time MD responded:  1:05

## 2012-12-12 NOTE — Consult Note (Signed)
[facility] Consult: 11:01 AM 12/12/2012   Referring Provider: Dr Rosita Fire Primary Care Physician: Dr Holley Raring is Nephrologist  Gastroenterologist:  Dr Madlyn Frankel in [place]   Reason for Consultation:  Hematochezia.   HPI: [name] [name] is a 52 y.o. male.  ESRD since 2007, MWF dialysis, polysubstance abuser, chronic right pleural effusion, malignant htn. Went to [facility] after developing several episodes of painless hematochezia beginning evening of 12/11/11. GI planned colonoscopy after hemodialysis but pt only drank 1/3 gollytely prep and dialysis was postponed.  Hgb dropped 9.6 to 6.5, transfused one unit PRBCs.  Nuclear bleeding scan 1/2 identified bleeding at distal transverse colon, splenic flexure.  Dr Lucky Cowboy (vascular surgeon) planned embolization which pt refused.  At pt request, he was transferred to [facility] hospital 12/11/12. He is s/p left thoracentesis with one liter of grossly bloody fluid removed this, just sent to lab.  He has had one thoracentesis in past.  Received 2 additional PRBCs today 12/13/11 during dialysis.  Never had GI bleed before.  No previous EGD or colonoscopy.   ROS Smokes marijuana which helps stimulate his appetite.  No cocaine use in last year. ETOH:  "Couple of shots"  2 otr 3 times a year.  No NSAIDs. No falls.  Nausea was a problem when taking Sensipar in past, has resolved now that he takes servings of fatty foods with this med.   No seizures.  Will get chest pain, cramps in left arm and headache during hemodialysis.  No itching, rash or sores.  No nose bleeds. Flu shot current.  No foreign travel.  No sick contacts.  Lives in [place] with wife.  No kids at home. Used to drive a truck long distance. Wears glasses to read.    Past Medical History  Diagnosis Date  . End-stage renal disease on hemodialysis   . Secondary hyperparathyroidism   . Hypertension   . Polysubstance abuse   . Anemia of  chronic disease     BL Hgb ~10  . Tobacco abuse     Past Surgical History  Procedure Date  . Surgery for horseshoe kidney     Prior to Admission medications   Medication Sig Start Date End Date Taking? Authorizing Provider  AmLODIPine Besylate (NORVASC PO) Take 1 tablet by mouth daily.   Yes Historical Provider, MD  Calcium Acetate, Phos Binder, (PHOSLO PO) Take 667 mg by mouth 3 (three) times daily.   Yes Historical Provider, MD  cinacalcet (SENSIPAR) 90 MG tablet Take 180 mg by mouth daily.   Yes Historical Provider, MD    Scheduled Meds:    . cinacalcet  90 mg Oral BID PC  . darbepoetin (ARANESP) injection - DIALYSIS  60 mcg Intravenous Q Fri-HD  . doxercalciferol  4.5 mcg Intravenous Q M,W,F-HD  . multivitamin  1 tablet Oral QHS   Infusions:   PRN Meds: hydrALAZINE   Allergies as of 12/11/2012  . (No Known Allergies)    Family History  Problem Relation Age of Onset  . Thyroid disease Mother   . Renal Disease     no colon cancer or intestinal or stomach diseases.   History   Social History  . Marital Status: Single    Spouse Name: N/A    Number of Children: 2  . Years of Education: 12   Occupational History  . disabled     previously drove trucks   Social History Main Topics  . Smoking status: Current Every Day Smoker -- 0.5 packs/day for  20 years    Types: Cigarettes  . Smokeless tobacco: Not on file  . Alcohol Use: Yes     Comment: occasional  . Drug Use: Yes     Comment: remote cocaine abuse, ongoing marijuana use.  Marland Kitchen Sexually Active: Not on file     PHYSICAL EXAM: Vital signs in last 24 hours: Temp:  [97.2 F (36.2 C)-98.9 F (37.2 C)] 98.5 F (36.9 C) (01/03 0756) Pulse Rate:  [82-126] 110  (01/03 0730) Resp:  [10-27] 17  (01/03 1013) BP: (91-129)/(55-97) 112/57 mmHg (01/03 0730) SpO2:  [95 %-100 %] 99 % (01/03 0730) Weight:  [101.1 kg (222 lb 14.2 oz)-108 kg (238 lb 1.6 oz)] 101.1 kg (222 lb 14.2 oz) (01/03 0315)  General: obese,  comfortable AAM.  NAD.  Not ill appearing Head:  No assymetry or swelling  Eyes:  No icterus or pallor Ears:  Not HOH  Nose:  No discharge or bleeding Mouth:  Moist, pink, clear oral MM Neck:  No mass Lungs:  Diminished BS on right > left lung.  No cough Heart: RRR.  No MRG Abdomen:  Soft, obese, NT, ND.  No mass, bruits, HSM.   Rectal: deferred   Musc/Skeltl: no joint deformities or swelling.  Extremities:  No pedal edema  Neurologic:  No tremor.  Oriented x 3.   No gross deficits. Skin:  No AVMs, no sores or rash.  Tattoos:  None seen Nodes:  No inguinal or cervical adenopathy   Psych:  Pleasant, cooperative.  No anxiety.   Intake/Output from previous day: 01/02 0701 - 01/03 0700 In: 843 [I.V.:200; Blood:633; IV Piggyback:10] Out: -  Intake/Output this shift: Total I/O In: -  Out: 353 [Other:353]  LAB RESULTS:  Basename 12/11/12 2350  WBC 7.1  HGB 6.4*  HCT 19.7*  PLT 132*   BMET Lab Results  Component Value Date   NA 139 12/11/2012   K 3.1* 12/12/2012   K 6.5* 12/11/2012   CL 97 12/11/2012   CO2 24 12/11/2012   GLUCOSE 80 12/11/2012   BUN 80* 12/11/2012   CREATININE 20.72* 12/11/2012   CALCIUM 7.8* 12/11/2012   LFT  Basename 12/11/12 2350  PROT 5.9*  ALBUMIN 2.9*2.8*  AST 6  ALT 5  ALKPHOS 52  BILITOT 0.3  BILIDIR 0.1  IBILI 0.2*   PT/INR No results found for this basename: INR, PROTIME   Hepatitis Panel  Basename 12/12/12 0340  HEPBSAG NEGATIVE  HCVAB --  HEPAIGM --  HEPBIGM --     Drugs of Abuse  No results found for this basename: labopia, cocainscrnur, labbenz, amphetmu, thcu, labbarb     RADIOLOGY STUDIES: Dg Chest 2 View 12/12/2012  *RADIOLOGY REPORT*  Clinical Data: Chronic right effusion and shortness of breath  CHEST - 2 VIEW  Comparison: None.  Findings: The large right-sided pleural effusion is noted.  Only mild aeration of the right lung is seen. Mild infiltrative density is noted in the aerated portion of the right lung.  The left lung is  clear.  The cardiac shadow is within normal limits.  No acute bony abnormality is noted.  IMPRESSION: Large right-sided effusion which by history is chronic.  Only mild aeration of the right lung is noted.  There is some infiltrative density within the right lung.   Original Report Authenticated By: Inez Catalina, M.D.     ENDOSCOPIC STUDIES: None ever  IMPRESSION: *  LGIB with painless hematochezia, suspect diverticular source.  No bleeding since 1700 12/12/11 prior to  transfer from [place].  *  ESRD *  ABL anemia.  Transfused 3 units PRBCs thus far.  *  Chronic pleural effusion, s/p thoracentesis.   PLAN: *  Needs colonoscopy, but this is not urgent, and we are moving into weekend with emergency cases only.  Will allow solid renal diet.  If rebleeds over weekend, repeat nuclear RBC scan with aim towards angiogram/embolization.   Can pursue colonoscopy next week or as outpt.  This could be done in [place] with Dr Dionne Milo or with Dr Olevia Perches in [place].  Dr Benson Norway is on call for the weekend and will look in on pt.   Did have problems with Golytely prep in recent days, might help to give some Reglan with any future preps.    LOS: 1 day   Azucena Freed  12/12/2012, 11:01 AM Pager: [phone] I have reviewed the above note, examined the patient and agree with plan of treatment.He had a large volume LGIB from a ?? splenic flexure as suggested by tagged RBC pool scan on 12/11/2012 but refused  mesenteric arteriogram claiming concern about his remaining renal function if he receives IV contrast. He is very manipulative- ate [employer] sandwich while on clear liquids, also takes his own meds from home,he was  reluctant to drink Golytely etc. He is currently stable. If he rebleeds, he agrees to undergo repeat RBC bleeding scan and eventually colonoscopy. Doubt ischemic colitis because of lack of abdominal pain but he has been hypotensive while on dialysis. He has received no Heparin during his HD today. Will follow  with you.  [facility] Pager # [phone]

## 2012-12-13 ENCOUNTER — Inpatient Hospital Stay (HOSPITAL_COMMUNITY): Payer: Medicare Other

## 2012-12-13 ENCOUNTER — Encounter (HOSPITAL_COMMUNITY): Payer: Self-pay | Admitting: General Practice

## 2012-12-13 DIAGNOSIS — I10 Essential (primary) hypertension: Secondary | ICD-10-CM

## 2012-12-13 LAB — BASIC METABOLIC PANEL
BUN: 46 mg/dL — ABNORMAL HIGH (ref 6–23)
GFR calc Af Amer: 4 mL/min — ABNORMAL LOW (ref >90)
GFR calc non Af Amer: 3 mL/min — ABNORMAL LOW (ref >90)

## 2012-12-13 LAB — CBC
HCT: 22.1 % — ABNORMAL LOW (ref 39.0–52.0)
HCT: 23.6 % — ABNORMAL LOW (ref 39.0–52.0)
Hemoglobin: 7.2 g/dL — ABNORMAL LOW (ref 13.0–17.0)
MCHC: 32.6 g/dL (ref 30.0–36.0)
MCV: 91.1 fL (ref 78.0–100.0)
RBC: 2.46 MIL/uL — ABNORMAL LOW (ref 4.22–5.81)
RBC: 2.59 MIL/uL — ABNORMAL LOW (ref 4.22–5.81)
RDW: 16.1 % — ABNORMAL HIGH (ref 11.5–15.5)
WBC: 7 10*3/uL (ref 4.0–10.5)
WBC: 7.7 10*3/uL (ref 4.0–10.5)

## 2012-12-13 LAB — TROPONIN I: Troponin I: <0.3 ng/mL (ref <0.30)

## 2012-12-13 MED ORDER — CALCIUM ACETATE 667 MG PO CAPS
667.0000 mg | ORAL_CAPSULE | Freq: Three times a day (TID) | ORAL | Status: DC
  Filled 2012-12-13 (×2): qty 1

## 2012-12-13 MED ORDER — CALCIUM ACETATE 667 MG PO CAPS
2001.0000 mg | ORAL_CAPSULE | Freq: Two times a day (BID) | ORAL | Status: DC
  Administered 2012-12-17: 2001 mg via ORAL
  Filled 2012-12-13 (×13): qty 3

## 2012-12-13 MED ORDER — HYDROCODONE-ACETAMINOPHEN 5-325 MG PO TABS
1.0000 | ORAL_TABLET | Freq: Four times a day (QID) | ORAL | Status: DC | PRN
  Administered 2012-12-13 (×2): 1 via ORAL
  Filled 2012-12-13 (×2): qty 1

## 2012-12-13 MED ORDER — ACETAMINOPHEN 325 MG PO TABS: 650.0000 mg | ORAL_TABLET | Freq: Four times a day (QID) | ORAL | Status: DC | PRN

## 2012-12-13 MED ORDER — CALCIUM ACETATE 667 MG PO CAPS
2668.0000 mg | ORAL_CAPSULE | Freq: Three times a day (TID) | ORAL | Status: DC
  Administered 2012-12-13 – 2012-12-25 (×23): 2668 mg via ORAL
  Administered 2012-12-25: 1334 mg via ORAL
  Administered 2012-12-25: 2668 mg via ORAL
  Administered 2012-12-26: 1334 mg via ORAL
  Administered 2012-12-27 – 2012-12-30 (×8): 2668 mg via ORAL
  Filled 2012-12-13 (×53): qty 4

## 2012-12-13 NOTE — Progress Notes (Signed)
[facility]  Subjective:  Continues to have GI bleed, now reports more tarry stools. Pt sitting next to bed. Denies nausea, chest pain, headache, dizziness.  Objective: Vital signs in last 24 hours: Blood pressure 107/72, pulse 99, temperature 97.6 F (36.4 C), temperature source Oral, resp. rate 18, height 6\' 1"  (1.854 m), weight 226 lb 11.2 oz (102.83 kg), SpO2 99.00%.   PHYSICAL EXAM General--as above Chest--clear breath sounds bilaterally Heart--RRR. No murmurs Abd--soft. Normal BS Extr--no edema  Lab Results:   Lab 12/13/12 0655 12/12/12 1215 12/12/12 0518 12/11/12 2350  NA 140 141 -- 139  K 4.5 4.1 3.1* --  CL 99 98 -- 97  CO2 29 30 -- 24  BUN 46* 32* -- 80*  CREATININE 14.32* 10.70* -- 20.72*  ALB -- -- -- --  GLUCOSE 87 -- -- --  CALCIUM 8.0* 8.4 -- 7.8*  PHOS -- -- -- 8.0*     Basename 12/13/12 0655 12/12/12 1205  WBC 7.0 5.5  HGB 7.2* 7.9*  HCT 22.1* 24.0*  PLT 138* 124*    Scheduled Meds:   . cinacalcet  180 mg Oral Q supper  . darbepoetin (ARANESP) injection - DIALYSIS  60 mcg Intravenous Q Fri-HD  . doxercalciferol  4.5 mcg Intravenous Q M,W,F-HD  . multivitamin  1 tablet Oral QHS   Continuous Infusions:  PRN Meds:.hydrALAZINE   Assessment/Plan: 1. Hyperkalemia: Resolved. K 4.5 today 2. Lower GI bleed--Hb 7.2 transfusion after 2 units of PRBC's. Per GI recommends colonoscopy next week or as outpatient. Nuclear RBC scan if rebleeding occur over weekend. 3. HTN: Low BP, Holding BP home med. 4. 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 PTH 583.2 on 12/11/12. 5. ESRD: Hemodialysis pt his schedule is MWF. He received one last Monday and the emergent HD on Friday am.     LOS: 2 days   PILOTO, DAYARMYS 12/13/2012,10:22 AM I have seen and examined this patient and agree with plan.  He had HD yesterday.  Still having melena.   Will follow . Bowe Sidor F,MD 12/13/2012 1:13 PM

## 2012-12-13 NOTE — Progress Notes (Signed)
Nutrition Brief Note  Patient identified on the Malnutrition Screening Tool (MST) Report for unsure of any weight loss.  Patient reports no nutrition problems PTA.   Body mass index is 29.91 kg/(m^2). Patient meets criteria for overweight based on current BMI.   Current diet order is Renal 80/90-2-2, patient is consuming 85-100% of meals at this time. Labs and medications reviewed.   No nutrition interventions warranted at this time. If nutrition issues arise, please consult RD.   Molli Barrows, RD, LDN, [place] Pager# [phone] After Hours Pager# [phone]

## 2012-12-13 NOTE — Progress Notes (Addendum)
[facility] PROGRESS NOTE  [name] [name] [id number] DOB: 1961-12-08 DOA: 12/11/2012 PCP: No primary provider on file.  Assessment/Plan  Principal Problem:  *Lower GI bleed:  Had a small amount of dark blood in stools and hemoglobin in down slightly today.  Patient was tachycardic, but now improving. - Appears to have stopped or be very low grade  - Appreciate GI assistance - Possible colonoscopy on Monday - Repeat tagged RBC scan if brisk bleeding recurs with possible IR intervention of positive  Acute blood loss anemia, hemoglobin trended down slightly today -Cont erythropoietin  -Transfuse for hemodynamic instability or Hgb < 7 gm/dL - Repeat CBC this afternoon  Thrombocytopenia:  Likely due to GIB -  Trend and transfuse for plt < 50 with ongoing bleeding   Pleural effusion, right.  Appears to be chronic. Was tapped @ [facility] approx 15 months ago and repeat thoracentesis done by PCCM on 1/3.  Pleuritic chest pain this morning, worse this afternoon. -  WBC 1145, protein 3.2, LDH 138 -  F/u cytology  -  CT chest showed round atelectasis in the right upper lobe and complex pleural effusion with thin membrane separating a peripheral layer.  Maximum thickness of 1cm.  No lung mass or lymphadenopathy -  CXR   ESRD (end stage renal disease)  -  Appreciate nephrology assistance -  Will restart phoslo 4 tabs AC and 3 tabs with snacks   Sinus tachycardia, resolving with transfusion  H/O: hypertension with some mild hypotension this AM -  Continue to hold BP medications -PRN hydralazine ordered  Diet:  Renal dialysis Access:  fistula IVF:  none Proph:  SCDs  Code Status: full code Family Communication: spoke with patient alone at bedside Disposition Plan: pending medically stable   Consultants:  GI  Nephrology  PCCM  Procedures:  HD  Thoracentesis, right 1/3  CT chest 1/3  Antibiotics:  none   HPI/Subjective: Patient states that he has some pleuritic  chest pain on the right side of the chest and under right axilla since the thoracentesis yesterday.  Denies nausea, vomiting, abdominal pain.  He states he does not notice SOB at rest and prior to thora, had only noticed dyspnea with moderate exertion.  Had a BM with soft brown and black stool with some dark liquid blood mixed in and some of the water was tinged with dark blood.  Tissue had some blood on it when he wiped that was also dark.     Objective: Filed Vitals:   12/12/12 2015 12/12/12 2217 12/13/12 0505 12/13/12 1000  BP: 123/68 92/57 94/47  107/72  Pulse: 119 109 94 99  Temp: 98.6 F (37 C) 98.2 F (36.8 C) 98.2 F (36.8 C) 97.6 F (36.4 C)  TempSrc: Oral Oral Oral Oral  Resp: 16 17 17 18   Height:      Weight:  102.83 kg (226 lb 11.2 oz)    SpO2: 98% 98% 99% 99%    Intake/Output Summary (Last 24 hours) at 12/13/12 1312 Last data filed at 12/13/12 0900  Gross per 24 hour  Intake    240 ml  Output      0 ml  Net    240 ml   Filed Weights   12/11/12 2100 12/12/12 0315 12/12/12 2217  Weight: 108 kg (238 lb 1.6 oz) 101.1 kg (222 lb 14.2 oz) 102.83 kg (226 lb 11.2 oz)    Exam:   General:  AAM, sitting in chair, no acute distress  HEENT:  MMM  Cardiovascular:  RRR, 1/6 murmur LSB, no rubs or gallops, 2+ pulses  Respiratory:  CTAB  Abdomen:  NABS, soft, nondistended, nontender, no organomegaly  MSK:  Normal tone and bulk, no LEE  Neuro:  Grossly intact  Data Reviewed: Basic Metabolic Panel:  Lab [id number] 12/12/12 1215 12/12/12 0518 12/11/12 2350  NA 140 141 -- 139  K 4.5 4.1 3.1* 6.5*  CL 99 98 -- 97  CO2 29 30 -- 24  GLUCOSE 87 83 -- 80  BUN 46* 32* -- 80*  CREATININE 14.32* 10.70* -- 20.72*  CALCIUM 8.0* 8.4 -- 7.8*  MG -- -- -- --  PHOS -- -- -- 8.0*   Liver Function Tests:  Lab 12/11/12 2350  AST 6  ALT 5  ALKPHOS 52  BILITOT 0.3  PROT 5.9*  ALBUMIN 2.9*2.8*   No results found for this basename: LIPASE:5,AMYLASE:5 in the last 168  hours No results found for this basename: AMMONIA:5 in the last 168 hours CBC:  Lab 12/13/12 0655 12/12/12 1205 12/11/12 2350  WBC 7.0 5.5 7.1  NEUTROABS -- 3.5 4.7  HGB 7.2* 7.9* 6.4*  HCT 22.1* 24.0* 19.7*  MCV 89.8 88.9 91.4  PLT 138* 124* 132*   Cardiac Enzymes:  Lab 12/13/12 0655 12/11/12 2350  CKTOTAL -- --  CKMB -- --  CKMBINDEX -- --  TROPONINI <0.30 <0.30   BNP (last 3 results) No results found for this basename: PROBNP:3 in the last 8760 hours CBG: No results found for this basename: GLUCAP:5 in the last 168 hours  Recent Results (from the past 240 hour(s))  MRSA PCR SCREENING     Status: Normal   Collection Time   12/11/12  9:23 PM      Component Value Range Status Comment   MRSA by PCR NEGATIVE  NEGATIVE Final      Studies: Dg Chest 2 View  12/12/2012  *RADIOLOGY REPORT*  Clinical Data: Chronic right effusion and shortness of breath  CHEST - 2 VIEW  Comparison: None.  Findings: The large right-sided pleural effusion is noted.  Only mild aeration of the right lung is seen. Mild infiltrative density is noted in the aerated portion of the right lung.  The left lung is clear.  The cardiac shadow is within normal limits.  No acute bony abnormality is noted.  IMPRESSION: Large right-sided effusion which by history is chronic.  Only mild aeration of the right lung is noted.  There is some infiltrative density within the right lung.   Original Report Authenticated By: Inez Catalina, M.D.    Ct Chest Wo Contrast  12/12/2012  *RADIOLOGY REPORT*  Clinical Data: Status post thoracentesis.  Residual pleural fluid. End-stage renal disease.  On dialysis.  Anemia of chronic disease. Polysubstance abuse.  CT CHEST WITHOUT CONTRAST  Technique:  Multidetector CT imaging of the chest was performed following the standard protocol without IV contrast.  Comparison: Portable chest obtained today.  Findings: Extensive bilateral subcutaneous varices.  Moderate-sized right pleural effusion with a  peripheral higher density component separated from the remainder of the pleural fluid by a thin membrane.  The peripheral component measures 1 cm in maximum thickness.  No pleural fluid on the left.  There are rounded mass- like densities in the right upper lobe with an appearance compatible with rounded atelectasis.  No true lung masses and no enlarged lymph nodes are seen.  The included portions of the kidneys are small with multiple small cysts on the left.  The bones are diffusely  sclerotic.  IMPRESSION:  1.  Moderate-sized right pleural effusion with a peripheral slightly higher density component separated by a thin membrane. 2.  Areas of rounded atelectasis in the right upper lobe. 3.  Extensive bilateral subcutaneous varices. 4.  Diffuse bone sclerosis.  This can be the result of secondary hyperparathyroidism related to the patient's chronic renal disease.   Original Report Authenticated By: Claudie Revering, M.D.    Dg Chest Port 1 View  12/12/2012  *RADIOLOGY REPORT*  Clinical Data: Status post thoracentesis.  PORTABLE CHEST - 1 VIEW  Comparison: 12/12/2012, earlier the same day  Findings: Right pleural effusion has decreased in the interval with persistent right circumferential pleural thickening/fluid.  No evidence for pneumothorax.  Left lung is clear. The cardiopericardial silhouette is within normal limits for size.  IMPRESSION: Decreased right pleural effusion without evidence for pneumothorax.   Original Report Authenticated By: Misty Stanley, M.D.     Scheduled Meds:   . cinacalcet  180 mg Oral Q supper  . darbepoetin (ARANESP) injection - DIALYSIS  60 mcg Intravenous Q Fri-HD  . doxercalciferol  4.5 mcg Intravenous Q M,W,F-HD  . multivitamin  1 tablet Oral QHS   Continuous Infusions:   Principal Problem:  *Lower GI bleed Active Problems:  Hyperkalemia  Acute blood loss anemia  Sinus tachycardia, resolved with PRBC resuscitation  ESRD (end stage renal disease)  H/O: hypertension   Pleural effusion, right    Time spent: 30 min    Leslea Vowles, [facility] Pager [phone]. If 8PM-8AM, please contact night-coverage at [email or web address], password [facility] 12/13/2012, 1:12 PM  LOS: 2 days

## 2012-12-13 NOTE — Progress Notes (Signed)
Progress Note for [facility]  Subjective: No acute events overnight.  He passed some mild residual blood.  Objective: Vital signs in last 24 hours: Temp:  [98.2 F (36.8 C)-99 F (37.2 C)] 98.2 F (36.8 C) (01/04 0505) Pulse Rate:  [88-119] 94  (01/04 0505) Resp:  [16-22] 17  (01/04 0505) BP: (92-126)/(47-78) 94/47 mmHg (01/04 0505) SpO2:  [98 %-100 %] 99 % (01/04 0505) Weight:  [226 lb 11.2 oz (102.83 kg)] 226 lb 11.2 oz (102.83 kg) (01/03 2217) Last BM Date: 12/11/12  Intake/Output from previous day: 01/03 0701 - 01/04 0700 In: 240 [P.O.:240] Out: 353  Intake/Output this shift:    General appearance: alert and no distress GI: soft, non-tender; bowel sounds normal; no masses,  no organomegaly  Lab Results:  [facility] 12/12/12 1205 12/11/12 2350  WBC 5.5 7.1  HGB 7.9* 6.4*  HCT 24.0* 19.7*  PLT 124* 132*   BMET  Basename 12/12/12 1215 12/12/12 0518 12/11/12 2350  NA 141 -- 139  K 4.1 3.1* 6.5*  CL 98 -- 97  CO2 30 -- 24  GLUCOSE 83 -- 80  BUN 32* -- 80*  CREATININE 10.70* -- 20.72*  CALCIUM 8.4 -- 7.8*   LFT  Basename 12/11/12 2350  PROT 5.9*  ALBUMIN 2.9*2.8*  AST 6  ALT 5  ALKPHOS 52  BILITOT 0.3  BILIDIR 0.1  IBILI 0.2*   PT/INR No results found for this basename: LABPROT:2,INR:2 in the last 72 hours Hepatitis Panel  Basename 12/12/12 0340  HEPBSAG NEGATIVE  HCVAB --  HEPAIGM --  HEPBIGM --   C-Diff No results found for this basename: CDIFFTOX:3 in the last 72 hours Fecal Lactopherrin No results found for this basename: FECLLACTOFRN in the last 72 hours  Studies/Results: Dg Chest 2 View  12/12/2012  *RADIOLOGY REPORT*  Clinical Data: Chronic right effusion and shortness of breath  CHEST - 2 VIEW  Comparison: None.  Findings: The large right-sided pleural effusion is noted.  Only mild aeration of the right lung is seen. Mild infiltrative density is noted in the aerated portion of the right lung.  The left lung is clear.  The cardiac shadow  is within normal limits.  No acute bony abnormality is noted.  IMPRESSION: Large right-sided effusion which by history is chronic.  Only mild aeration of the right lung is noted.  There is some infiltrative density within the right lung.   Original Report Authenticated By: Inez Catalina, M.D.    Ct Chest Wo Contrast  12/12/2012  *RADIOLOGY REPORT*  Clinical Data: Status post thoracentesis.  Residual pleural fluid. End-stage renal disease.  On dialysis.  Anemia of chronic disease. Polysubstance abuse.  CT CHEST WITHOUT CONTRAST  Technique:  Multidetector CT imaging of the chest was performed following the standard protocol without IV contrast.  Comparison: Portable chest obtained today.  Findings: Extensive bilateral subcutaneous varices.  Moderate-sized right pleural effusion with a peripheral higher density component separated from the remainder of the pleural fluid by a thin membrane.  The peripheral component measures 1 cm in maximum thickness.  No pleural fluid on the left.  There are rounded mass- like densities in the right upper lobe with an appearance compatible with rounded atelectasis.  No true lung masses and no enlarged lymph nodes are seen.  The included portions of the kidneys are small with multiple small cysts on the left.  The bones are diffusely sclerotic.  IMPRESSION:  1.  Moderate-sized right pleural effusion with a peripheral slightly higher density component  separated by a thin membrane. 2.  Areas of rounded atelectasis in the right upper lobe. 3.  Extensive bilateral subcutaneous varices. 4.  Diffuse bone sclerosis.  This can be the result of secondary hyperparathyroidism related to the patient's chronic renal disease.   Original Report Authenticated By: Claudie Revering, M.D.    Dg Chest Port 1 View  12/12/2012  *RADIOLOGY REPORT*  Clinical Data: Status post thoracentesis.  PORTABLE CHEST - 1 VIEW  Comparison: 12/12/2012, earlier the same day  Findings: Right pleural effusion has decreased in  the interval with persistent right circumferential pleural thickening/fluid.  No evidence for pneumothorax.  Left lung is clear. The cardiopericardial silhouette is within normal limits for size.  IMPRESSION: Decreased right pleural effusion without evidence for pneumothorax.   Original Report Authenticated By: Misty Stanley, M.D.     Medications:  Scheduled:   . cinacalcet  180 mg Oral Q supper  . darbepoetin (ARANESP) injection - DIALYSIS  60 mcg Intravenous Q Fri-HD  . doxercalciferol  4.5 mcg Intravenous Q M,W,F-HD  . multivitamin  1 tablet Oral QHS   Continuous:   Assessment/Plan: 1) Lower GI bleed. 2) ESRD on dialysis.   Overall the patient remains stable.  The plan per Dr. Olevia Perches was to monitor him, however, at some point he requires a colonoscopy.    Plan: 1) Monitor CBC. 2) Transfuse as necessary. 3) ? Colonoscopy on Monday.  LOS: 2 days   Dwayne Begay D 12/13/2012, 7:18 AM

## 2012-12-14 LAB — CBC
HCT: 22.4 % — ABNORMAL LOW (ref 39.0–52.0)
Hemoglobin: 7.1 g/dL — ABNORMAL LOW (ref 13.0–17.0)
MCHC: 31.7 g/dL (ref 30.0–36.0)
MCV: 90.7 fL (ref 78.0–100.0)
RDW: 15.1 % (ref 11.5–15.5)

## 2012-12-14 LAB — RENAL FUNCTION PANEL
BUN: 63 mg/dL — ABNORMAL HIGH (ref 6–23)
Chloride: 97 mEq/L (ref 96–112)
Creatinine, Ser: 17.79 mg/dL — ABNORMAL HIGH (ref 0.50–1.35)
Glucose, Bld: 109 mg/dL — ABNORMAL HIGH (ref 70–99)
Phosphorus: 6.9 mg/dL — ABNORMAL HIGH (ref 2.3–4.6)
Potassium: 4.4 mEq/L (ref 3.5–5.1)

## 2012-12-14 MED ORDER — SODIUM CHLORIDE 0.9 % IV SOLN
INTRAVENOUS | Status: DC
  Administered 2012-12-14: 10 mL via INTRAVENOUS
  Administered 2012-12-16: 500 mL via INTRAVENOUS

## 2012-12-14 MED ORDER — HYDROCODONE-ACETAMINOPHEN 5-325 MG PO TABS
1.0000 | ORAL_TABLET | Freq: Four times a day (QID) | ORAL | Status: DC | PRN
  Administered 2012-12-14 – 2012-12-17 (×2): 2 via ORAL
  Filled 2012-12-14 (×2): qty 2

## 2012-12-14 MED ORDER — PEG 3350-KCL-NA BICARB-NACL 420 G PO SOLR
4000.0000 mL | Freq: Once | ORAL | Status: AC
  Administered 2012-12-14: 4000 mL via ORAL
  Filled 2012-12-14: qty 4000

## 2012-12-14 NOTE — Progress Notes (Signed)
Pt profile: 54 yobm smoker with ESRD, chronic R effusion transferred to [facility] 1/02 from [facility] with BRBPR, symptomatic anemia, hyperkalemia. He had undergone a tagged RBC scan @ [facility] which showed apparent bleeding from tranverse colon near splenic flexure.  Active problems: Principal Problem:  *Lower GI bleed Active Problems:  Hyperkalemia  Acute blood loss anemia  Sinus tachycardia, resolved with PRBC resuscitation  ESRD (end stage renal disease)  H/O: hypertension  Pleural effusion, right   Lines, Tubes, etc: none  Microbiology: MRSA screen 1/2 > neg R pleural fluid 1/2 >>>  Antibiotics:  none   Studies/Events: R Tcentesis  1/3 >  1.5 liters red-amber fluid > exudative, L>P  Consults:  Renal GI  Best Practice: DVT: SCDs SUP: N/I Nutrition: reg Glycemic control: N/I Sedation/analgesia: N/I    Subj: No distress, no complaints sitting up on RA  Obj: Filed Vitals:   12/14/12 1020  BP: 124/81  Pulse: 87  Temp: 99.1 F (37.3 C)  Resp: 17    Gen: WDWN in NAD HEENT: WNL Neck: No JVD Chest: d diminished BS on R base, no wheeze Cardiac: RRR s M Abd: soft, NT, NABS Ext: warm, no edema  BMET    Component Value Date/Time   NA 140 12/13/2012 0655   K 4.5 12/13/2012 0655   CL 99 12/13/2012 0655   CO2 29 12/13/2012 0655   GLUCOSE 87 12/13/2012 0655   BUN 46* 12/13/2012 0655   CREATININE 14.32* 12/13/2012 0655   CALCIUM 8.0* 12/13/2012 0655   GFRNONAA 3* 12/13/2012 0655   GFRAA 4* 12/13/2012 0655    CBC    Component Value Date/Time   WBC 7.7 12/13/2012 1520   RBC 2.59* 12/13/2012 1520   HGB 7.7* 12/13/2012 1520   HCT 23.6* 12/13/2012 1520   PLT 130* 12/13/2012 1520   MCV 91.1 12/13/2012 1520   MCH 29.7 12/13/2012 1520   MCHC 32.6 12/13/2012 1520   RDW 16.1* 12/13/2012 1520   LYMPHSABS 1.3 12/12/2012 1205   MONOABS 0.5 12/12/2012 1205   EOSABS 0.1 12/12/2012 1205   BASOSABS 0.0 12/12/2012 1205    CXR: n/a   IMPRESSION: Principal Problem:  *Lower GI bleed  Appears to have stopped  or be very low grade  Hemodynamically stable  -GI consult requested 1/03> Hung EvalL> colonoscopy 1/6 planned   Acute blood loss anemia  Improved after transfusion  -Cont erythropoietin  -Transfuse for hemodynamic instability or Hgb < 7.0 gm/dL   Pleural effusion, right  Appears to be chronic. Was tapped @ [place] approx 15 months ago  Worrisome for malignancy in smoker but more likely uremic or volume related  -R Tcentesis  1/3 >  1.5 liters red-amber fluid > exudative, L>P  Cytology >>>  -CT chest to r/o mass 1/3 > loculated   Hyperkalemia  Resolved with HD   ESRD (end stage renal disease)  Mgmt per Renal   Sinus tachycardia, resolved with PRBC resuscitation   H/O: hypertension    -PRN hydralazine ordered     Most likely this is longstanding loculated effusion from previous pna and would need vats to resolve. Will discuss with PCCM team 1/6 when hopefully cytology back   Christinia Gully, MD Pulmonary and [place] [phone] After 5:30 PM or weekends, call [phone]

## 2012-12-14 NOTE — Progress Notes (Addendum)
[facility]  Subjective:  awake, alert. Pt sitting next to bed. Notes a small amount of red blood at end of bowel movements but much improved. Denies melena. No other complaints.   Objective: Vital signs in last 24 hours: Blood pressure 124/81, pulse 87, temperature 99.1 F (37.3 C), temperature source Oral, resp. rate 17, height 6\' 1"  (1.854 m), weight 226 lb 11.2 oz (102.83 kg), SpO2 93.00%.   PHYSICAL EXAM General--as above Chest--clear breath sounds bilaterally Heart--RRR. No murmurs Abd--soft. Normal BS Extr--no edema  Lab Results:   Lab 12/13/12 1520 12/13/12 0655 12/12/12 1215 12/12/12 0518 12/11/12 2350  NA -- 140 141 -- 139  K -- 4.5 4.1 3.1* --  CL -- 99 98 -- 97  CO2 -- 29 30 -- 24  BUN -- 46* 32* -- 80*  CREATININE -- 14.32* 10.70* -- 20.72*  ALB -- -- -- -- --  GLUCOSE -- 87 -- -- --  CALCIUM -- 8.0* 8.4 -- 7.8*  PHOS 7.2* -- -- -- 8.0*     Basename 12/13/12 1520 12/13/12 0655  WBC 7.7 7.0  HGB 7.7* 7.2*  HCT 23.6* 22.1*  PLT 130* 138*    Scheduled Meds:    . calcium acetate  2,001 mg Oral BID BM  . calcium acetate  2,668 mg Oral TID WC  . cinacalcet  180 mg Oral Q supper  . darbepoetin (ARANESP) injection - DIALYSIS  60 mcg Intravenous Q Fri-HD  . doxercalciferol  4.5 mcg Intravenous Q M,W,F-HD  . multivitamin  1 tablet Oral QHS  . polyethylene glycol-electrolytes  4,000 mL Oral Once   Continuous Infusions:    . sodium chloride     PRN Meds:.acetaminophen, hydrALAZINE, HYDROcodone-acetaminophen   Assessment/Plan: 1. ESRD: Hemodialysis pt his schedule is MWF. He received one last Monday and the emergent HD on Friday am. Plan for HD tomorrow. 2. Hyperkalemia: Resolved. K 4.5 on 12/13/12. Ordered repeat renal function panel today.  2. Lower GI bleed--Hb 7.2 transfusion after 2 units of PRBC's. hgb 7.7 yesterday-ordered CBC now. GI plans for colonoscopy tomorrow.  Nuclear RBC scan if rebleeding occur over weekend. 4. HTN: Low BP,  continue to hold BP home med. Restart as BP tolerates.  5. 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 PTH 583.2 on 12/11/12.     LOS: 3 days   Brayton Mars. Melanee Spry, MD, PGY2 12/14/2012 11:27 AMI have seen and examined this patient and agree with plan.  He should get HD AFTER colonoscopy tomorrow.  Hgb 7.1 today and less rectal bleeding.   Salem Senate F,MD 12/14/2012 1:19 PM

## 2012-12-14 NOTE — Progress Notes (Signed)
Progress Note for [name] [name] [name]  Subjective: No complaints.  No hematochezia.  Objective: Vital signs in last 24 hours: Temp:  [97.6 F (36.4 C)-99 F (37.2 C)] 98.2 F (36.8 C) (01/05 0449) Pulse Rate:  [95-121] 121  (01/05 0449) Resp:  [17-18] 17  (01/05 0449) BP: (101-160)/(51-81) 160/81 mmHg (01/05 0449) SpO2:  [99 %-100 %] 99 % (01/05 0449) Last BM Date: 12/11/12  Intake/Output from previous day: 01/04 0701 - 01/05 0700 In: 360 [P.O.:360] Out: -  Intake/Output this shift:    General appearance: alert and no distress [name] [name]: soft, non-tender; bowel sounds normal; no masses,  no organomegaly  Lab Results:  Basename 12/13/12 1520 12/13/12 0655 12/12/12 1205  WBC 7.7 7.0 5.5  HGB 7.7* 7.2* 7.9*  HCT 23.6* 22.1* 24.0*  PLT 130* 138* 124*   BMET  Basename 12/13/12 0655 12/12/12 1215 12/12/12 0518 12/11/12 2350  NA 140 141 -- 139  K 4.5 4.1 3.1* --  CL 99 98 -- 97  CO2 29 30 -- 24  GLUCOSE 87 83 -- 80  BUN 46* 32* -- 80*  CREATININE 14.32* 10.70* -- 20.72*  CALCIUM 8.0* 8.4 -- 7.8*   LFT  Basename 12/11/12 2350  PROT 5.9*  ALBUMIN 2.9*2.8*  AST 6  ALT 5  ALKPHOS 52  BILITOT 0.3  BILIDIR 0.1  IBILI 0.2*   PT/INR No results found for this basename: LABPROT:2,INR:2 in the last 72 hours Hepatitis Panel  Basename 12/12/12 0340  HEPBSAG NEGATIVE  HCVAB --  HEPAIGM --  HEPBIGM --   C-Diff No results found for this basename: CDIFFTOX:3 in the last 72 hours Fecal Lactopherrin No results found for this basename: FECLLACTOFRN in the last 72 hours  Studies/Results: Dg Chest 2 View  12/13/2012  *RADIOLOGY REPORT*  Clinical Data: Pleuritic chest pain post thoracentesis  CHEST - 2 VIEW  Comparison: 12/12/2012  Findings: Moderately large right pleural effusion with slight improvement from the prior study.  No pneumothorax.  Right lung density is unchanged and may be atelectasis.  Underlying tumor or infection could be present.  Left lung remains clear.  IMPRESSION:  No pneumothorax post right thoracentesis.  There remains a moderately large right pleural effusion and airspace disease in the right lung.   Original Report Authenticated By: Carl Best, M.[name].    Dg Chest 2 View  12/12/2012  *RADIOLOGY REPORT*  Clinical Data: Chronic right effusion and shortness of breath  CHEST - 2 VIEW  Comparison: None.  Findings: The large right-sided pleural effusion is noted.  Only mild aeration of the right lung is seen. Mild infiltrative density is noted in the aerated portion of the right lung.  The left lung is clear.  The cardiac shadow is within normal limits.  No acute bony abnormality is noted.  IMPRESSION: Large right-sided effusion which by history is chronic.  Only mild aeration of the right lung is noted.  There is some infiltrative density within the right lung.   Original Report Authenticated By: Inez Catalina, M.[name].    Ct Chest Wo Contrast  12/12/2012  *RADIOLOGY REPORT*  Clinical Data: Status post thoracentesis.  Residual pleural fluid. End-stage renal disease.  On dialysis.  Anemia of chronic disease. Polysubstance abuse.  CT CHEST WITHOUT CONTRAST  Technique:  Multidetector CT imaging of the chest was performed following the standard protocol without IV contrast.  Comparison: Portable chest obtained today.  Findings: Extensive bilateral subcutaneous varices.  Moderate-sized right pleural effusion with a peripheral higher density component separated from the remainder  of the pleural fluid by a thin membrane.  The peripheral component measures 1 cm in maximum thickness.  No pleural fluid on the left.  There are rounded mass- like densities in the right upper lobe with an appearance compatible with rounded atelectasis.  No true lung masses and no enlarged lymph nodes are seen.  The included portions of the kidneys are small with multiple small cysts on the left.  The bones are diffusely sclerotic.  IMPRESSION:  1.  Moderate-sized right pleural effusion with a peripheral  slightly higher density component separated by a thin membrane. 2.  Areas of rounded atelectasis in the right upper lobe. 3.  Extensive bilateral subcutaneous varices. 4.  Diffuse bone sclerosis.  This can be the result of secondary hyperparathyroidism related to the patient's chronic renal disease.   Original Report Authenticated By: Claudie Revering, M.[name].    Dg Chest Port 1 View  12/12/2012  *RADIOLOGY REPORT*  Clinical Data: Status post thoracentesis.  PORTABLE CHEST - 1 VIEW  Comparison: 12/12/2012, earlier the same day  Findings: Right pleural effusion has decreased in the interval with persistent right circumferential pleural thickening/fluid.  No evidence for pneumothorax.  Left lung is clear. The cardiopericardial silhouette is within normal limits for size.  IMPRESSION: Decreased right pleural effusion without evidence for pneumothorax.   Original Report Authenticated By: Misty Stanley, M.[name].     Medications:  Scheduled:   . calcium acetate  2,001 mg Oral BID BM  . calcium acetate  2,668 mg Oral TID WC  . cinacalcet  180 mg Oral Q supper  . darbepoetin (ARANESP) injection - DIALYSIS  60 mcg Intravenous Q Fri-HD  . doxercalciferol  4.5 mcg Intravenous Q M,W,F-HD  . multivitamin  1 tablet Oral QHS   Continuous:   Assessment/Plan: 1) Hematochezia. 2) Anemia. 3) Renal failure.   His outside bleeding scan revealed a source of bleeding around the splenic flexure.  No prior colonoscopy.  I will set him up for a colonoscopy tomorrow.  Plan: 1) Colonoscopy in the AM.  LOS: 3 days   [name] [name] [name] 12/14/2012, 7:48 AM

## 2012-12-14 NOTE — Progress Notes (Signed)
[facility] PROGRESS NOTE  DELL SANTELLAN [id number] DOB: 04-28-1961 DOA: 12/11/2012 PCP: No primary provider on file.  Assessment/Plan  *Lower GI bleed: Had trace amount of blood with BM today.   - Appreciate GI assistance - Colonoscopy on Monday - Repeat tagged RBC scan if brisk bleeding recurs with possible IR intervention of positive  Acute blood loss anemia, hemoglobin trended down again today -Cont erythropoietin  -Transfuse for hemodynamic instability or Hgb < 7 gm/dL -Will likely need blood transfusion tomorrow AM (possibly with HD?)  Thrombocytopenia:  Likely due to GIB.  Platelets approximately stable today. -  Trend and transfuse for plt < 50 with ongoing bleeding   Pleural effusion, right.  Appears to be chronic. Was tapped @ [facility] approx 15 months ago and repeat thoracentesis done by PCCM on 1/3.  Pleuritic chest pain this morning, worse this afternoon. -  WBC 1145, protein 3.2, LDH 138 -  F/u cytology  -  CT chest showed round atelectasis in the right upper lobe and complex pleural effusion with thin membrane separating a peripheral layer.  Maximum thickness of 1cm.  No lung mass or lymphadenopathy - Appreciate PCCM recommendations.  Will anticipate possible VATS.  ESRD (end stage renal disease)  -  Appreciate nephrology assistance   Sinus tachycardia, becoming borderline tachycardic.  May be due to symptomatic anemia. - Possible blood transfusion in AM  Hypertension:  BP stable off of home medications. -PRN hydralazine ordered  Diet:  Renal dialysis Access:  fistula IVF:  none Proph:  SCDs  Code Status: full code Family Communication: spoke with patient alone at bedside Disposition Plan: pending medically stable   Consultants:  GI  Nephrology  PCCM  Procedures:  HD  Thoracentesis, right 1/3  CT chest 1/3  Antibiotics:  none   HPI/Subjective: Patient states that he continues to have some moderate pleuritic right chest and axillary  pain.  Denies significant shortness of breath and cough.  Denies N/V/D/C.  Had a trace amount of blood at one end of his BM that was moderate in redness.   Objective: Filed Vitals:   12/13/12 2145 12/14/12 0449 12/14/12 1020 12/14/12 1438  BP: 125/73 160/81 124/81 123/77  Pulse: 95 121 87 99  Temp: 99 F (37.2 C) 98.2 F (36.8 C) 99.1 F (37.3 C)   TempSrc: Oral Oral    Resp: 17 17 17 18   Height:      Weight:      SpO2: 100% 99% 93% 98%    Intake/Output Summary (Last 24 hours) at 12/14/12 1613 Last data filed at 12/14/12 1021  Gross per 24 hour  Intake    240 ml  Output      0 ml  Net    240 ml   Filed Weights   12/11/12 2100 12/12/12 0315 12/12/12 2217  Weight: 108 kg (238 lb 1.6 oz) 101.1 kg (222 lb 14.2 oz) 102.83 kg (226 lb 11.2 oz)    Exam:   General:  AAM, sitting in chair, no acute distress  HEENT:  MMM  Cardiovascular:  Mild tachycardia, 1/6 murmur LSB, no rubs or gallops, 2+ pulses  Respiratory:  Diminished breath sounds on right.  CTA on left.  Abdomen:  NABS, soft, nondistended, nontender, no organomegaly  MSK:  Normal tone and bulk, no LEE  Neuro:  Grossly intact  Data Reviewed: Basic Metabolic Panel:  Lab [id number] 12/13/12 1520 12/13/12 0655 12/12/12 1215 12/12/12 0518 12/11/12 2350  NA 141 -- 140 141 -- 139  K 4.4 -- 4.5 4.1 3.1* 6.5*  CL 97 -- 99 98 -- 97  CO2 28 -- 29 30 -- 24  GLUCOSE 109* -- 87 83 -- 80  BUN 63* -- 46* 32* -- 80*  CREATININE 17.79* -- 14.32* 10.70* -- 20.72*  CALCIUM 7.7* -- 8.0* 8.4 -- 7.8*  MG -- -- -- -- -- --  PHOS 6.9* 7.2* -- -- -- 8.0*   Liver Function Tests:  Lab 12/14/12 1241 12/11/12 2350  AST -- 6  ALT -- 5  ALKPHOS -- 52  BILITOT -- 0.3  PROT -- 5.9*  ALBUMIN 3.0* 2.9*2.8*   No results found for this basename: LIPASE:5,AMYLASE:5 in the last 168 hours No results found for this basename: AMMONIA:5 in the last 168 hours CBC:  Lab 12/14/12 1240 12/13/12 1520 12/13/12 0655 12/12/12 1205  12/11/12 2350  WBC 6.7 7.7 7.0 5.5 7.1  NEUTROABS -- -- -- 3.5 4.7  HGB 7.1* 7.7* 7.2* 7.9* 6.4*  HCT 22.4* 23.6* 22.1* 24.0* 19.7*  MCV 90.7 91.1 89.8 88.9 91.4  PLT 126* 130* 138* 124* 132*   Cardiac Enzymes:  Lab 12/13/12 0655 12/11/12 2350  CKTOTAL -- --  CKMB -- --  CKMBINDEX -- --  TROPONINI <0.30 <0.30   BNP (last 3 results) No results found for this basename: PROBNP:3 in the last 8760 hours CBG: No results found for this basename: GLUCAP:5 in the last 168 hours  Recent Results (from the past 240 hour(s))  MRSA PCR SCREENING     Status: Normal   Collection Time   12/11/12  9:23 PM      Component Value Range Status Comment   MRSA by PCR NEGATIVE  NEGATIVE Final      Studies: Dg Chest 2 View  12/13/2012  *RADIOLOGY REPORT*  Clinical Data: Pleuritic chest pain post thoracentesis  CHEST - 2 VIEW  Comparison: 12/12/2012  Findings: Moderately large right pleural effusion with slight improvement from the prior study.  No pneumothorax.  Right lung density is unchanged and may be atelectasis.  Underlying tumor or infection could be present.  Left lung remains clear.  IMPRESSION: No pneumothorax post right thoracentesis.  There remains a moderately large right pleural effusion and airspace disease in the right lung.   Original Report Authenticated By: Carl Best, M.D.    Ct Chest Wo Contrast  12/12/2012  *RADIOLOGY REPORT*  Clinical Data: Status post thoracentesis.  Residual pleural fluid. End-stage renal disease.  On dialysis.  Anemia of chronic disease. Polysubstance abuse.  CT CHEST WITHOUT CONTRAST  Technique:  Multidetector CT imaging of the chest was performed following the standard protocol without IV contrast.  Comparison: Portable chest obtained today.  Findings: Extensive bilateral subcutaneous varices.  Moderate-sized right pleural effusion with a peripheral higher density component separated from the remainder of the pleural fluid by a thin membrane.  The peripheral component  measures 1 cm in maximum thickness.  No pleural fluid on the left.  There are rounded mass- like densities in the right upper lobe with an appearance compatible with rounded atelectasis.  No true lung masses and no enlarged lymph nodes are seen.  The included portions of the kidneys are small with multiple small cysts on the left.  The bones are diffusely sclerotic.  IMPRESSION:  1.  Moderate-sized right pleural effusion with a peripheral slightly higher density component separated by a thin membrane. 2.  Areas of rounded atelectasis in the right upper lobe. 3.  Extensive bilateral subcutaneous varices. 4.  Diffuse  bone sclerosis.  This can be the result of secondary hyperparathyroidism related to the patient's chronic renal disease.   Original Report Authenticated By: Claudie Revering, M.D.     Scheduled Meds:    . calcium acetate  2,001 mg Oral BID BM  . calcium acetate  2,668 mg Oral TID WC  . cinacalcet  180 mg Oral Q supper  . darbepoetin (ARANESP) injection - DIALYSIS  60 mcg Intravenous Q Fri-HD  . doxercalciferol  4.5 mcg Intravenous Q M,W,F-HD  . multivitamin  1 tablet Oral QHS  . polyethylene glycol-electrolytes  4,000 mL Oral Once   Continuous Infusions:    . sodium chloride      Principal Problem:  *Lower GI bleed Active Problems:  Hyperkalemia  Acute blood loss anemia  Sinus tachycardia, resolved with PRBC resuscitation  ESRD (end stage renal disease)  H/O: hypertension  Pleural effusion, right    Time spent: 30 min    Margan Elias, [facility] Pager [phone]. If 8PM-8AM, please contact night-coverage at [email or web address], password [facility] 12/14/2012, 4:13 PM  LOS: 3 days

## 2012-12-15 ENCOUNTER — Encounter (HOSPITAL_COMMUNITY)
Admission: EM | Disposition: A | Discharge status: Home Health | Payer: Self-pay | Source: Other Acute Inpatient Hospital | Attending: Thoracic Surgery (Cardiothoracic Vascular Surgery)

## 2012-12-15 LAB — CBC
Hemoglobin: 7 g/dL — ABNORMAL LOW (ref 13.0–17.0)
MCHC: 31.7 g/dL (ref 30.0–36.0)
RDW: 15 % (ref 11.5–15.5)
WBC: 5.6 10*3/uL (ref 4.0–10.5)

## 2012-12-15 LAB — RENAL FUNCTION PANEL
CO2: 26 mEq/L (ref 19–32)
Calcium: 8 mg/dL — ABNORMAL LOW (ref 8.4–10.5)
GFR calc Af Amer: 3 mL/min — ABNORMAL LOW (ref >90)
GFR calc non Af Amer: 2 mL/min — ABNORMAL LOW (ref >90)
Potassium: 4.3 mEq/L (ref 3.5–5.1)
Sodium: 144 mEq/L (ref 135–145)

## 2012-12-15 LAB — PREPARE RBC (CROSSMATCH)

## 2012-12-15 LAB — TYPE AND SCREEN
ABO/RH(D): A POS
Unit division: 0

## 2012-12-15 LAB — PATHOLOGIST SMEAR REVIEW: Path Review: INCREASED

## 2012-12-15 SURGERY — COLONOSCOPY
Anesthesia: Moderate Sedation

## 2012-12-15 MED ORDER — PENTAFLUOROPROP-TETRAFLUOROETH EX AERO: 1.0000 "application " | INHALATION_SPRAY | CUTANEOUS | Status: DC | PRN

## 2012-12-15 MED ORDER — HEPARIN SODIUM (PORCINE) 1000 UNIT/ML DIALYSIS: 20.0000 [IU]/kg | INTRAMUSCULAR | Status: DC | PRN

## 2012-12-15 MED ORDER — HEPARIN SODIUM (PORCINE) 1000 UNIT/ML DIALYSIS: 1000.0000 [IU] | INTRAMUSCULAR | Status: DC | PRN

## 2012-12-15 MED ORDER — ALTEPLASE 2 MG IJ SOLR: 2.0000 mg | Freq: Once | INTRAMUSCULAR | Status: DC | PRN

## 2012-12-15 MED ORDER — LIDOCAINE-PRILOCAINE 2.5-2.5 % EX CREA: 1.0000 "application " | TOPICAL_CREAM | CUTANEOUS | Status: DC | PRN

## 2012-12-15 MED ORDER — SODIUM CHLORIDE 0.9 % IV SOLN: 100.0000 mL | INTRAVENOUS | Status: DC | PRN

## 2012-12-15 MED ORDER — ONDANSETRON HCL 4 MG/2ML IJ SOLN
4.0000 mg | Freq: Four times a day (QID) | INTRAMUSCULAR | Status: DC
  Administered 2012-12-15 – 2012-12-29 (×31): 4 mg via INTRAVENOUS
  Filled 2012-12-15 (×22): qty 2

## 2012-12-15 MED ORDER — DOXERCALCIFEROL 4 MCG/2ML IV SOLN
INTRAVENOUS | Status: AC
  Administered 2012-12-15: 4.5 ug via INTRAVENOUS
  Filled 2012-12-15: qty 4

## 2012-12-15 MED ORDER — PROMETHAZINE HCL 25 MG/ML IJ SOLN
12.5000 mg | Freq: Four times a day (QID) | INTRAMUSCULAR | Status: DC | PRN
  Administered 2012-12-25: 12.5 mg via INTRAVENOUS
  Filled 2012-12-15: qty 1

## 2012-12-15 MED ORDER — NEPRO/CARBSTEADY PO LIQD: 237.0000 mL | ORAL | Status: DC | PRN

## 2012-12-15 MED ORDER — LIDOCAINE HCL (PF) 1 % IJ SOLN: 5.0000 mL | INTRAMUSCULAR | Status: DC | PRN

## 2012-12-15 MED ORDER — PEG-KCL-NACL-NASULF-NA ASC-C 100 G PO SOLR
1.0000 | Freq: Once | ORAL | Status: AC
  Administered 2012-12-15: 100 g via ORAL
  Filled 2012-12-15 (×2): qty 1

## 2012-12-15 NOTE — Progress Notes (Signed)
I have seen and examined this patient and agree with the plan of care  .  [name] [name] W 12/15/2012, 10:17 PM

## 2012-12-15 NOTE — Progress Notes (Signed)
[facility]  Subjective:  Sitting next to bed. Denies signs of GI bleeding.   Objective: Vital signs in last 24 hours: Blood pressure 148/85, pulse 81, temperature 98.2 F (36.8 C), temperature source Oral, resp. rate 18, height 6\' 1"  (1.854 m), weight 226 lb 13.7 oz (102.9 kg), SpO2 98.00%.   PHYSICAL EXAM Gen: NAD Neck: no JVD  Chest: Clear breath sounds. No rales Cardio: regular rate and rhythm, S1, S2 normal, no murmur, click, rub or gallop  GI: soft, non-tender; bowel sounds normal; no masses, no organomegaly  Extremities: extremities normal, atraumatic, no cyanosis or edema  Pulses: 2+ and symmetric  Neurologic: No focal deficit  Lab Results:   Lab 12/14/12 1241 12/13/12 1520 12/13/12 0655 12/12/12 1215 12/11/12 2350  NA 141 -- 140 141 --  K 4.4 -- 4.5 4.1 --  CL 97 -- 99 98 --  CO2 28 -- 29 30 --  BUN 63* -- 46* 32* --  CREATININE 17.79* -- 14.32* 10.70* --  ALB -- -- -- -- --  GLUCOSE 109* -- -- -- --  CALCIUM 7.7* -- 8.0* 8.4 --  PHOS 6.9* 7.2* -- -- 8.0*     Basename 12/14/12 1240 12/13/12 1520  WBC 6.7 7.7  HGB 7.1* 7.7*  HCT 22.4* 23.6*  PLT 126* 130*    Scheduled Meds:   . calcium acetate  2,001 mg Oral BID BM  . calcium acetate  2,668 mg Oral TID WC  . cinacalcet  180 mg Oral Q supper  . darbepoetin (ARANESP) injection - DIALYSIS  60 mcg Intravenous Q Fri-HD  . doxercalciferol  4.5 mcg Intravenous Q M,W,F-HD  . multivitamin  1 tablet Oral QHS  . ondansetron (ZOFRAN) IV  4 mg Intravenous Q6H  . peg 3350 powder  1 kit Oral Once   Continuous Infusions:   . sodium chloride 10 mL (12/14/12 1654)   PRN Meds:.sodium chloride, sodium chloride, acetaminophen, alteplase, feeding supplement (NEPRO CARB STEADY), heparin, heparin, hydrALAZINE, HYDROcodone-acetaminophen, lidocaine, lidocaine-prilocaine, pentafluoroprop-tetrafluoroeth, promethazine  Assessment/Plan:  1. ESRD: Hemodialysis pt his schedule is MWF. HD today. Hyperkalemia:  Resolved. K 4.4 on 12/14/12. Will repeat panel am.  2. 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 PTH 583.2 on 12/11/12. 3. Lower GI bleed--Hb 7.1 yesterday. Transfused 2 units of PRBC's. GI plans for colonoscopy tomorrow.  4. Right Pleural effusion: s/p Thoracocentesis.  Pt will have CT to r/o malignancy-- F/u by Pulmonology 5. HTN: Hydralazine PRN.     LOS: 4 days   PILOTO, DAYARMYS 12/15/2012,12:48 PM

## 2012-12-15 NOTE — Progress Notes (Signed)
Pt profile: 89 yobm smoker with ESRD, chronic R effusion transferred to [facility] 1/02 from [facility] with BRBPR, symptomatic anemia, hyperkalemia. He had undergone a tagged RBC scan @ [facility] which showed apparent bleeding from tranverse colon near splenic flexure. CT chest 1/3 - Moderate-sized right pleural effusion with a peripheral  slightly higher density component separated by a thin membrane.  2. Areas of rounded atelectasis in the right upper lobe.  3. Extensive bilateral subcutaneous varices.   Active problems: Principal Problem:  *Lower GI bleed Active Problems:  Hyperkalemia  Acute blood loss anemia  Sinus tachycardia, resolved with PRBC resuscitation  ESRD (end stage renal disease)  H/O: hypertension  Pleural effusion, right   Lines, Tubes, etc: none  Microbiology: MRSA screen 1/2 > neg R pleural fluid 1/2 >>>  Antibiotics:  none   Studies/Events: R Tcentesis  1/3 >  1.5 liters red-amber fluid > exudative, L>P  Consults:  Renal GI  Best Practice: DVT: SCDs SUP: N/I Nutrition: reg Glycemic control: N/I Sedation/analgesia: N/I    Subj: No distress, no complaints sitting up on RA afebrile  Obj: Filed Vitals:   12/15/12 1300  BP: 142/69  Pulse: 85  Temp:   Resp:     Gen: WDWN in NAD HEENT: WNL Neck: No JVD Chest: d diminished BS on R base, no wheeze Cardiac: RRR s M Abd: soft, NT, NABS Ext: warm, no edema  BMET    Component Value Date/Time   NA 141 12/14/2012 1241   K 4.4 12/14/2012 1241   CL 97 12/14/2012 1241   CO2 28 12/14/2012 1241   GLUCOSE 109* 12/14/2012 1241   BUN 63* 12/14/2012 1241   CREATININE 17.79* 12/14/2012 1241   CALCIUM 7.7* 12/14/2012 1241   GFRNONAA 3* 12/14/2012 1241   GFRAA 3* 12/14/2012 1241    CBC    Component Value Date/Time   WBC 6.7 12/14/2012 1240   RBC 2.47* 12/14/2012 1240   HGB 7.1* 12/14/2012 1240   HCT 22.4* 12/14/2012 1240   PLT 126* 12/14/2012 1240   MCV 90.7 12/14/2012 1240   MCH 28.7 12/14/2012 1240   MCHC 31.7 12/14/2012 1240   RDW 15.1 12/14/2012 1240   LYMPHSABS 1.3 12/12/2012 1205   MONOABS 0.5 12/12/2012 1205   EOSABS 0.1 12/12/2012 1205   BASOSABS 0.0 12/12/2012 1205    CXR: n/a   IMPRESSION: Principal Problem:  *Lower GI bleed  Appears to have stopped or be very low grade  -GI consult requested 1/03> colonoscopy planned, problems with prep noted   Acute blood loss anemia  Improved after transfusion  -Cont erythropoietin  -Transfuse for hemodynamic instability or Hgb < 7.0 gm/dL   Pleural effusion, right  Appears to be chronic. Was tapped @ [facility] approx 15 months ago  Worrisome for malignancy in smoker but more likely uremic or volume related  -R Tcentesis  1/3 >  1.5 liters red-amber fluid > exudative, L>P  Cytology >>>             Most likely this is longstanding loculated effusion from previous pna and would need vats for definitive treatment. Discussed  with pt 1/6 , wait for cytology - will ask TCTS to see meantime     Hyperkalemia  Resolved with HD   ESRD (end stage renal disease)  Mgmt per Renal   Sinus tachycardia, resolved with PRBC resuscitation   H/O: hypertension    -PRN hydralazine ordered     ALVA,RAKESH V.  [id number]

## 2012-12-15 NOTE — Progress Notes (Signed)
[facility] PROGRESS NOTE  [name] [name] [id number] DOB: Apr 19, 1961 DOA: 12/11/2012 PCP: No primary provider on file.  Assessment/Plan  *Lower GI bleed: No blood in BM today  - Appreciate GI assistance - Colonoscopy on Tues - Repeat tagged RBC scan if brisk bleeding recurs with possible IR intervention of positive  Acute blood loss anemia, hemoglobin trended down again today -Cont erythropoietin  -Transfuse 1 unit today  Thrombocytopenia:  Likely due to GIB.  Platelets approximately stable today. -  Trend and transfuse for plt < 50 with ongoing bleeding   Pleural effusion, right.  Appears to be chronic. Was tapped @ [facility] approx 15 months ago and repeat thoracentesis done by PCCM on 1/3.  Pleuritic chest pain this morning, worse this afternoon. -  WBC 1145, protein 3.2, LDH 138 -  Cytology with increased monocytes,  -  CT chest showed round atelectasis in the right upper lobe and complex pleural effusion with thin membrane separating a peripheral layer.  Maximum thickness of 1cm.  No lung mass or lymphadenopathy - Possible VATS.  ESRD (end stage renal disease)  -  Appreciate nephrology assistance   Sinus tachycardia, becoming borderline tachycardic.  May be due to symptomatic anemia. - Possible blood transfusion in AM  Hypertension:  BP stable off of home medications. -PRN hydralazine ordered  Diet:  Renal dialysis Access:  fistula IVF:  none Proph:  SCDs  Code Status: full code Family Communication: spoke with patient alone at bedside Disposition Plan: pending medically stable   Consultants:  GI  Nephrology  PCCM  Procedures:  HD  Thoracentesis, right 1/3  CT chest 1/3  Antibiotics:  none   HPI/Subjective: Patient states that he has stable moderate pleuritic right chest and axillary pain.  Denies significant shortness of breath and cough.  Denies N/V/D/C.     Objective: Filed Vitals:   12/15/12 1430 12/15/12 1445 12/15/12 1515 12/15/12  1530  BP: 139/81 136/81 125/85 141/79  Pulse: 94 93 92 93  Temp:  98.1 F (36.7 C) 98.9 F (37.2 C) 98.4 F (36.9 C)  TempSrc:  Oral Oral Oral  Resp:  18 16   Height:      Weight:      SpO2:        Intake/Output Summary (Last 24 hours) at 12/15/12 1546 Last data filed at 12/15/12 0900  Gross per 24 hour  Intake    240 ml  Output      0 ml  Net    240 ml   Filed Weights   12/12/12 2217 12/14/12 2120 12/15/12 1232  Weight: 102.83 kg (226 lb 11.2 oz) 101.969 kg (224 lb 12.8 oz) 102.9 kg (226 lb 13.7 oz)    Exam:   General:  AAM, walking in hall, then sitting in room  HEENT:  MMM  Cardiovascular:  RRR, 1/6 murmur LSB, no rubs or gallops, 2+ pulses  Respiratory:  Diminished breath sounds on right.  CTA on left.  Abdomen:  NABS, soft, nondistended, nontender, no organomegaly  MSK:  Normal tone and bulk, no LEE  Neuro:  Grossly intact  Data Reviewed: Basic Metabolic Panel:  Lab [id number] 12/14/12 1241 12/13/12 1520 12/13/12 0655 12/12/12 1215 12/12/12 0518 12/11/12 2350  NA 144 141 -- 140 141 -- 139  K 4.3 4.4 -- 4.5 4.1 3.1* --  CL 99 97 -- 99 98 -- 97  CO2 26 28 -- 29 30 -- 24  GLUCOSE 124* 109* -- 87 83 -- 80  BUN 67*  63* -- 46* 32* -- 80*  CREATININE 20.41* 17.79* -- 14.32* 10.70* -- 20.72*  CALCIUM 8.0* 7.7* -- 8.0* 8.4 -- 7.8*  MG -- -- -- -- -- -- --  PHOS 6.8* 6.9* 7.2* -- -- -- 8.0*   Liver Function Tests:  Lab 12/15/12 1258 12/14/12 1241 12/11/12 2350  AST -- -- 6  ALT -- -- 5  ALKPHOS -- -- 52  BILITOT -- -- 0.3  PROT -- -- 5.9*  ALBUMIN 3.0* 3.0* 2.9*2.8*   No results found for this basename: LIPASE:5,AMYLASE:5 in the last 168 hours No results found for this basename: AMMONIA:5 in the last 168 hours CBC:  Lab 12/15/12 1258 12/14/12 1240 12/13/12 1520 12/13/12 0655 12/12/12 1205 12/11/12 2350  WBC 5.6 6.7 7.7 7.0 5.5 --  NEUTROABS -- -- -- -- 3.5 4.7  HGB 7.0* 7.1* 7.7* 7.2* 7.9* --  HCT 22.1* 22.4* 23.6* 22.1* 24.0* --  MCV 91.3  90.7 91.1 89.8 88.9 --  PLT 163 126* 130* 138* 124* --   Cardiac Enzymes:  Lab 12/13/12 0655 12/11/12 2350  CKTOTAL -- --  CKMB -- --  CKMBINDEX -- --  TROPONINI <0.30 <0.30   BNP (last 3 results) No results found for this basename: PROBNP:3 in the last 8760 hours CBG: No results found for this basename: GLUCAP:5 in the last 168 hours  Recent Results (from the past 240 hour(s))  MRSA PCR SCREENING     Status: Normal   Collection Time   12/11/12  9:23 PM      Component Value Range Status Comment   MRSA by PCR NEGATIVE  NEGATIVE Final      Studies: Dg Chest 2 View  12/13/2012  *RADIOLOGY REPORT*  Clinical Data: Pleuritic chest pain post thoracentesis  CHEST - 2 VIEW  Comparison: 12/12/2012  Findings: Moderately large right pleural effusion with slight improvement from the prior study.  No pneumothorax.  Right lung density is unchanged and may be atelectasis.  Underlying tumor or infection could be present.  Left lung remains clear.  IMPRESSION: No pneumothorax post right thoracentesis.  There remains a moderately large right pleural effusion and airspace disease in the right lung.   Original Report Authenticated By: Carl Best, M.D.     Scheduled Meds:    . calcium acetate  2,001 mg Oral BID BM  . calcium acetate  2,668 mg Oral TID WC  . cinacalcet  180 mg Oral Q supper  . darbepoetin (ARANESP) injection - DIALYSIS  60 mcg Intravenous Q Fri-HD  . doxercalciferol  4.5 mcg Intravenous Q M,W,F-HD  . multivitamin  1 tablet Oral QHS  . ondansetron (ZOFRAN) IV  4 mg Intravenous Q6H  . peg 3350 powder  1 kit Oral Once   Continuous Infusions:    . sodium chloride 10 mL (12/14/12 1654)    Principal Problem:  *Lower GI bleed Active Problems:  Hyperkalemia  Acute blood loss anemia  Sinus tachycardia, resolved with PRBC resuscitation  ESRD (end stage renal disease)  H/O: hypertension  Pleural effusion, right    Time spent: 30 min    [name] [name], [facility] Pager [phone]. If 8PM-8AM, please contact night-coverage at [email or web address], password [facility] 12/15/2012, 3:46 PM  LOS: 4 days

## 2012-12-15 NOTE — Progress Notes (Signed)
[facility] Progress Note    Since last GI note: He was unable to complete the colonoscopy prep last night. Procedure was cancelled.  He has had no overt bleeding in 2 days and that was a small amount of red blood per rectum  Objective: Vital signs in last 24 hours: Temp:  [98 F (36.7 C)-99.1 F (37.3 C)] 98.4 F (36.9 C) (01/06 1053) Pulse Rate:  [82-99] 91  (01/06 1053) Resp:  [17-18] 18  (01/06 1053) BP: (123-151)/(70-86) 151/70 mmHg (01/06 1053) SpO2:  [96 %-99 %] 98 % (01/06 1053) Weight:  [224 lb 12.8 oz (101.969 kg)] 224 lb 12.8 oz (101.969 kg) (01/05 2120) Last BM Date: 12/14/12 General: alert and oriented times 3 Heart: regular rate and rythm Abdomen: soft, non-tender, non-distended, normal bowel sounds   Lab Results:  Basename 12/14/12 1240 12/13/12 1520 12/13/12 0655  WBC 6.7 7.7 7.0  HGB 7.1* 7.7* 7.2*  PLT 126* 130* 138*  MCV 90.7 91.1 89.8    Basename 12/14/12 1241 12/13/12 0655 12/12/12 1215  NA 141 140 141  K 4.4 4.5 4.1  CL 97 99 98  CO2 28 29 30   GLUCOSE 109* 87 83  BUN 63* 46* 32*  CREATININE 17.79* 14.32* 10.70*  CALCIUM 7.7* 8.0* 8.4    Basename 12/14/12 1241  PROT --  ALBUMIN 3.0*  AST --  ALT --  ALKPHOS --  BILITOT --  BILIDIR --  IBILI --   No results found for this basename: INR:2 in the last 72 hours   Studies/Results: Dg Chest 2 View  12/13/2012  *RADIOLOGY REPORT*  Clinical Data: Pleuritic chest pain post thoracentesis  CHEST - 2 VIEW  Comparison: 12/12/2012  Findings: Moderately large right pleural effusion with slight improvement from the prior study.  No pneumothorax.  Right lung density is unchanged and may be atelectasis.  Underlying tumor or infection could be present.  Left lung remains clear.  IMPRESSION: No pneumothorax post right thoracentesis.  There remains a moderately large right pleural effusion and airspace disease in the right lung.   Original Report Authenticated By: Carl Best, M.D.       Medications: Scheduled Meds:   . calcium acetate  2,001 mg Oral BID BM  . calcium acetate  2,668 mg Oral TID WC  . cinacalcet  180 mg Oral Q supper  . darbepoetin (ARANESP) injection - DIALYSIS  60 mcg Intravenous Q Fri-HD  . doxercalciferol  4.5 mcg Intravenous Q M,W,F-HD  . multivitamin  1 tablet Oral QHS  . ondansetron (ZOFRAN) IV  4 mg Intravenous Q6H   Continuous Infusions:   . sodium chloride 10 mL (12/14/12 1654)   PRN Meds:.acetaminophen, hydrALAZINE, HYDROcodone-acetaminophen, promethazine    Assessment/Plan: 52 y.o. male recent overt GI bleeding from splenic flexure site (noted on [facility] bleeding scan)  Will try ONE LAST time to get him prepped for in patient colonoscopy, tomorrow AM.  I am writing new prep orders now.    Owens Loffler, MD  12/15/2012, 11:07 AM [facility] Pager [phone]

## 2012-12-15 NOTE — Progress Notes (Signed)
Patient having difficulty drinking prep for colonoscopy.Patient stated,"I don't think I can drink all of this prep by midnight."Encouraged patient to drink as much as possible .Call placed to [place] .Dr.Hung aware .Stated if patient unable to complete prep may have to cancel procedure in am.

## 2012-12-15 NOTE — Procedures (Signed)
Patient seen on dialysis. GIB now with Hb 7   Suggested that patient is transfused. Primary team notified

## 2012-12-15 NOTE — Progress Notes (Signed)
Called to room by patient .Patient stated," I won't be able to finish prep tonight .Is there any way I can take prep over 2 day course." Told patient that he would have to discuss it with doctor.

## 2012-12-15 NOTE — Progress Notes (Signed)
Asked by Dr. Elsworth Soho to see patient  He is currently off the floor and I was unable to see him.   He was admitted with a lower GI bleed- workup still in progress  Noted to have a right pleural effusion. This is apparently chronic.   Will try to see tomorrow, but obviously LGI bleed takes precedence and will plan to deal with his pleural effusion at a later date once GI issues are resolved.

## 2012-12-16 ENCOUNTER — Encounter (HOSPITAL_COMMUNITY)
Admission: EM | Disposition: A | Discharge status: Home Health | Payer: Self-pay | Source: Other Acute Inpatient Hospital | Attending: Thoracic Surgery (Cardiothoracic Vascular Surgery)

## 2012-12-16 ENCOUNTER — Encounter (HOSPITAL_COMMUNITY): Payer: Self-pay | Admitting: *Deleted

## 2012-12-16 DIAGNOSIS — K5730 Diverticulosis of large intestine without perforation or abscess without bleeding: Secondary | ICD-10-CM

## 2012-12-16 DIAGNOSIS — D638 Anemia in other chronic diseases classified elsewhere: Secondary | ICD-10-CM

## 2012-12-16 DIAGNOSIS — D126 Benign neoplasm of colon, unspecified: Secondary | ICD-10-CM

## 2012-12-16 DIAGNOSIS — J90 Pleural effusion, not elsewhere classified: Secondary | ICD-10-CM

## 2012-12-16 HISTORY — PX: COLONOSCOPY: SHX5424

## 2012-12-16 LAB — CBC
Platelets: 163 10*3/uL (ref 150–400)
RBC: 2.69 MIL/uL — ABNORMAL LOW (ref 4.22–5.81)
WBC: 5 10*3/uL (ref 4.0–10.5)

## 2012-12-16 LAB — RENAL FUNCTION PANEL
CO2: 28 mEq/L (ref 19–32)
Calcium: 8.9 mg/dL (ref 8.4–10.5)
Chloride: 99 mEq/L (ref 96–112)
GFR calc Af Amer: 5 mL/min — ABNORMAL LOW (ref >90)
GFR calc non Af Amer: 4 mL/min — ABNORMAL LOW (ref >90)
Potassium: 4.4 mEq/L (ref 3.5–5.1)
Sodium: 142 mEq/L (ref 135–145)

## 2012-12-16 SURGERY — COLONOSCOPY
Anesthesia: Moderate Sedation

## 2012-12-16 MED ORDER — MIDAZOLAM HCL 5 MG/ML IJ SOLN
INTRAMUSCULAR | Status: AC
  Filled 2012-12-16: qty 4

## 2012-12-16 MED ORDER — SODIUM CHLORIDE 0.9 % IV SOLN: INTRAVENOUS | Status: DC

## 2012-12-16 MED ORDER — FENTANYL CITRATE 0.05 MG/ML IJ SOLN
INTRAMUSCULAR | Status: DC | PRN
  Administered 2012-12-16 (×4): 25 ug via INTRAVENOUS

## 2012-12-16 MED ORDER — DIPHENHYDRAMINE HCL 50 MG/ML IJ SOLN
INTRAMUSCULAR | Status: AC
  Filled 2012-12-16: qty 1

## 2012-12-16 MED ORDER — FENTANYL CITRATE 0.05 MG/ML IJ SOLN
INTRAMUSCULAR | Status: AC
  Filled 2012-12-16: qty 4

## 2012-12-16 MED ORDER — MIDAZOLAM HCL 5 MG/5ML IJ SOLN
INTRAMUSCULAR | Status: DC | PRN
  Administered 2012-12-16 (×4): 2.5 mg via INTRAVENOUS

## 2012-12-16 NOTE — Consult Note (Signed)
Reason for Consult:Right pleural effusion Referring Physician: Dr. Jamie Brookes [name] [name] is an 52 y.o. male.  HPI: 52 yo with a history of ESRD, hypertension, polysubstance abuse and a chronic right pleural effusion who presented with an acute GI bleed. He had a positive tagged RBC scan at [facility]. He was transferred here and bleeding resolved. He had colonoscopy today which showed a small polyp and some diverticuli at the splenic flexure.   He has a history of a right pleural effusion and thoracentesis about 18 months ago at [facility]. He's not sure when it reaccumulated. He has been having increased SOB with exertion lately. He also c/o of a sensation of fullness and inability to take in a deep breath. He occasionally has right sided chest discomfort, usually when he coughs. A CXR on admission showed a large right pleural effusion. A CT showed this to be multiloculated.  Thoracentesis drained 1.5 liters. There was a considerable effusion even after drainage. Studies were consistent with an exudate, cytology was negative.   Past Medical History  Diagnosis Date  . End-stage renal disease on hemodialysis   . Secondary hyperparathyroidism   . Hypertension   . Polysubstance abuse   . Anemia of chronic disease     BL Hgb ~10  . Tobacco abuse     Past Surgical History  Procedure Date  . Surgery for horseshoe kidney     Family History  Problem Relation Age of Onset  . Thyroid disease Mother   . Renal Disease      Social History:  reports that he has been smoking Cigarettes.  He has a 10 pack-year smoking history. He does not have any smokeless tobacco history on file. He reports that he drinks alcohol. He reports that he uses illicit drugs.  Allergies: No Known Allergies  Medications:  Prior to Admission:  Prescriptions prior to admission  Medication Sig Dispense Refill  . amLODipine (NORVASC) 10 MG tablet Take 10 mg by mouth daily.      . calcium acetate (PHOSLO) 667 MG capsule Take  2,668 mg by mouth 3 (three) times daily with meals.       . calcium acetate (PHOSLO) 667 MG capsule Take 2,001 mg by mouth with snacks.      . cinacalcet (SENSIPAR) 90 MG tablet Take 180 mg by mouth daily.      . hydrochlorothiazide (HYDRODIURIL) 25 MG tablet Take 25 mg by mouth daily.        Results for orders placed during the hospital encounter of 12/11/12 (from the past 48 hour(s))  RENAL FUNCTION PANEL     Status: Abnormal   Collection Time   12/15/12 12:58 PM      Component Value Range Comment   Sodium 144  135 - 145 mEq/L    Potassium 4.3  3.5 - 5.1 mEq/L    Chloride 99  96 - 112 mEq/L    CO2 26  19 - 32 mEq/L    Glucose, Bld 124 (*) 70 - 99 mg/dL    BUN 67 (*) 6 - 23 mg/dL    Creatinine, Ser 20.41 (*) 0.50 - 1.35 mg/dL    Calcium 8.0 (*) 8.4 - 10.5 mg/dL    Phosphorus 6.8 (*) 2.3 - 4.6 mg/dL    Albumin 3.0 (*) 3.5 - 5.2 g/dL    GFR calc non Af Amer 2 (*) >90 mL/min    GFR calc Af Amer 3 (*) >90 mL/min   CBC     Status:  Abnormal   Collection Time   12/15/12 12:58 PM      Component Value Range Comment   WBC 5.6  4.0 - 10.5 K/uL    RBC 2.42 (*) 4.22 - 5.81 MIL/uL    Hemoglobin 7.0 (*) 13.0 - 17.0 g/dL    HCT 22.1 (*) 39.0 - 52.0 %    MCV 91.3  78.0 - 100.0 fL    MCH 28.9  26.0 - 34.0 pg    MCHC 31.7  30.0 - 36.0 g/dL    RDW 15.0  11.5 - 15.5 %    Platelets 163  150 - 400 K/uL   TYPE AND SCREEN     Status: Normal   Collection Time   12/15/12  2:30 PM      Component Value Range Comment   ABO/RH(D) A POS      Antibody Screen NEG      Sample Expiration 12/18/2012      Unit Number [id number]      Blood Component Type RED CELLS,LR      Unit division 00      Status of Unit ISSUED,FINAL      Transfusion Status OK TO TRANSFUSE      Crossmatch Result Compatible     PREPARE RBC (CROSSMATCH)     Status: Normal   Collection Time   12/15/12  2:30 PM      Component Value Range Comment   Order Confirmation ORDER PROCESSED BY BLOOD BANK     CBC     Status: Abnormal   Collection  Time   12/16/12 10:39 AM      Component Value Range Comment   WBC 5.0  4.0 - 10.5 K/uL    RBC 2.69 (*) 4.22 - 5.81 MIL/uL    Hemoglobin 8.0 (*) 13.0 - 17.0 g/dL    HCT 25.1 (*) 39.0 - 52.0 %    MCV 93.3  78.0 - 100.0 fL    MCH 29.7  26.0 - 34.0 pg    MCHC 31.9  30.0 - 36.0 g/dL    RDW 16.0 (*) 11.5 - 15.5 %    Platelets 163  150 - 400 K/uL   RENAL FUNCTION PANEL     Status: Abnormal   Collection Time   12/16/12 10:39 AM      Component Value Range Comment   Sodium 142  135 - 145 mEq/L    Potassium 4.4  3.5 - 5.1 mEq/L    Chloride 99  96 - 112 mEq/L    CO2 28  19 - 32 mEq/L    Glucose, Bld 92  70 - 99 mg/dL    BUN 27 (*) 6 - 23 mg/dL DIALYSIS   Creatinine, Ser 11.98 (*) 0.50 - 1.35 mg/dL DIALYSIS   Calcium 8.9  8.4 - 10.5 mg/dL    Phosphorus 6.1 (*) 2.3 - 4.6 mg/dL    Albumin 3.3 (*) 3.5 - 5.2 g/dL    GFR calc non Af Amer 4 (*) >90 mL/min    GFR calc Af Amer 5 (*) >90 mL/min     No results found.  Review of Systems  Constitutional: Negative for fever.       Poor appetite  Respiratory: Positive for cough and shortness of breath. Negative for sputum production and wheezing.   Cardiovascular: Positive for chest pain (right sided).  Gastrointestinal: Positive for blood in stool and melena.  Genitourinary:       Renal failure  Skin: Positive for itching.   Blood  pressure 131/78, pulse 85, temperature 98.2 F (36.8 C), temperature source Oral, resp. rate 22, height 6\' 1"  (1.854 m), weight 220 lb 9.6 oz (100.064 kg), SpO2 97.00%. Physical Exam  Vitals reviewed. Constitutional: He is oriented to person, place, and time. He appears well-developed and well-nourished. No distress.  HENT:  Head: Normocephalic and atraumatic.  Eyes: EOM are normal. Pupils are equal, round, and reactive to light.  Neck: No thyromegaly present.  Cardiovascular: Normal rate, regular rhythm and normal heart sounds.   No murmur heard. Respiratory:       Diminished BS on Right, dullness to percussion    GI: Soft. There is no tenderness.  Musculoskeletal: He exhibits no edema.       Graft right arm  Lymphadenopathy:    He has no cervical adenopathy.  Neurological: He is alert and oriented to person, place, and time. No cranial nerve deficit.  Skin: Skin is warm and dry.   CT Chest   End-stage renal disease. On dialysis. Anemia of chronic disease.  Polysubstance abuse.  CT CHEST WITHOUT CONTRAST  Technique: Multidetector CT imaging of the chest was performed  following the standard protocol without IV contrast.  Comparison: Portable chest obtained today.  Findings: Extensive bilateral subcutaneous varices. Moderate-sized  right pleural effusion with a peripheral higher density component  separated from the remainder of the pleural fluid by a thin  membrane. The peripheral component measures 1 cm in maximum  thickness. No pleural fluid on the left. There are rounded mass-  like densities in the right upper lobe with an appearance  compatible with rounded atelectasis. No true lung masses and no  enlarged lymph nodes are seen. The included portions of the  kidneys are small with multiple small cysts on the left. The bones  are diffusely sclerotic.  IMPRESSION:  1. Moderate-sized right pleural effusion with a peripheral  slightly higher density component separated by a thin membrane.  2. Areas of rounded atelectasis in the right upper lobe.  3. Extensive bilateral subcutaneous varices.  4. Diffuse bone sclerosis. This can be the result of secondary  hyperparathyroidism related to the patient's chronic renal disease.   Assessment/Plan: 52 year old dialysis patient with a chronic right pleural effusion. Although we don't have a detailed knowledge of it's time course, we do know that it is now loculated and exudative in nature. This will need a VATS to completely drain the effusion and reexpand the lung. A decortication may also be necessary. This is somewhat complicated given that his  acute presentation was for a GI bleed not the pleural effusion.  I did discuss with him the proposed procedure which is Right VATS, drainage of pleural effusion and possible decortication. I discussed the general nature of the procedure, the need for general anesthesia, the expected hospital stay and overall recovery. We discussed the indications, risks, benefits, alternatives and odds of success. He understands the risks include, but are not limited to death, Mi, DVT, PE, bleeding, possible need for transfusion, infection, air leak, recurrent effusion, cardiac arrhythmias, as well as unforeseeable complications.   Will need to discuss whether to address during this admission for GI bleed or whether to have him return later. Chole Driver C 12/16/2012, 5:58 PM

## 2012-12-16 NOTE — Interval H&P Note (Signed)
History and Physical Interval Note:  12/16/2012 9:17 AM  [name] [name]  has presented today for surgery, with the diagnosis of gi bleeding  The various methods of treatment have been discussed with the patient and family. After consideration of risks, benefits and other options for treatment, the patient has consented to  Procedure(s) (LRB) with comments: COLONOSCOPY (N/A) as a surgical intervention .  The patient's history has been reviewed, patient examined, no change in status, stable for surgery.  I have reviewed the patient's chart and labs.  Questions were answered to the patient's satisfaction.     Owens Loffler

## 2012-12-16 NOTE — H&P (View-Only) (Signed)
[name] [name]    Since last GI [name]: [name] [name] was unable to complete the colonoscopy prep last night. Procedure was cancelled.  [name] [name] has had no overt bleeding in 2 days and that was a small amount of red blood per rectum  Objective: Vital signs in last 24 hours: Temp:  [98 F (36.7 C)-99.1 F (37.3 C)] 98.4 F (36.9 C) (01/06 1053) Pulse Rate:  [82-99] 91  (01/06 1053) Resp:  [17-18] 18  (01/06 1053) BP: (123-151)/(70-86) 151/70 mmHg (01/06 1053) SpO2:  [96 %-99 %] 98 % (01/06 1053) Weight:  [224 lb 12.8 oz (101.969 kg)] 224 lb 12.8 oz (101.969 kg) (01/05 2120) Last BM Date: 12/14/12 General: alert and oriented times 3 Heart: regular rate and rythm Abdomen: soft, non-tender, non-distended, normal bowel sounds   Lab Results:  Basename 12/14/12 1240 12/13/12 1520 12/13/12 0655  WBC 6.7 7.7 7.0  HGB 7.1* 7.7* 7.2*  PLT 126* 130* 138*  MCV 90.7 91.1 89.8    Basename 12/14/12 1241 12/13/12 0655 12/12/12 1215  NA 141 140 141  K 4.4 4.5 4.1  CL 97 99 98  CO2 28 29 30   GLUCOSE 109* 87 83  BUN 63* 46* 32*  CREATININE 17.79* 14.32* 10.70*  CALCIUM 7.7* 8.0* 8.4    Basename 12/14/12 1241  PROT --  ALBUMIN 3.0*  AST --  ALT --  ALKPHOS --  BILITOT --  BILIDIR --  IBILI --   No results found for this basename: INR:2 in the last 72 hours   Studies/Results: Dg Chest 2 View  12/13/2012  *RADIOLOGY REPORT*  Clinical Data: Pleuritic chest pain post thoracentesis  CHEST - 2 VIEW  Comparison: 12/12/2012  Findings: Moderately large right pleural effusion with slight improvement from the prior study.  No pneumothorax.  Right lung density is unchanged and may be atelectasis.  Underlying tumor or infection could be present.  Left lung remains clear.  IMPRESSION: No pneumothorax post right thoracentesis.  There remains a moderately large right pleural effusion and airspace disease in the right lung.   Original Report Authenticated By: Carl Best, M.D.       Medications: Scheduled Meds:   . calcium acetate  2,001 mg Oral BID BM  . calcium acetate  2,668 mg Oral TID WC  . cinacalcet  180 mg Oral Q supper  . darbepoetin (ARANESP) injection - DIALYSIS  60 mcg Intravenous Q Fri-HD  . doxercalciferol  4.5 mcg Intravenous Q M,W,F-HD  . multivitamin  1 tablet Oral QHS  . ondansetron (ZOFRAN) IV  4 mg Intravenous Q6H   Continuous Infusions:   . sodium chloride 10 mL (12/14/12 1654)   PRN Meds:.acetaminophen, hydrALAZINE, HYDROcodone-acetaminophen, promethazine    Assessment/Plan: 52 y.o. male recent overt GI bleeding from splenic flexure site (noted on [place] bleeding scan)  Will try ONE LAST time to get him prepped for in patient colonoscopy, tomorrow AM.  I am writing new prep orders now.    [name] Loffler, MD  12/15/2012, 11:07 AM [facility] Pager [phone]

## 2012-12-16 NOTE — Progress Notes (Signed)
I have seen and examined this patient and agree with the plan of care . Tyrone Pautsch W 12/16/2012, 3:59 PM

## 2012-12-16 NOTE — Progress Notes (Signed)
Utilization review completed.  

## 2012-12-16 NOTE — Progress Notes (Signed)
[name] [name]  Subjective:  Pt had HD yesterday. NPO on prep for colonoscopy today.   Objective: Vital signs in last 24 hours: Blood pressure 129/85, pulse 85, temperature 98.6 F (37 C), temperature source Oral, resp. rate 9, height 6\' 1"  (1.854 m), weight 220 lb 9.6 oz (100.064 kg), SpO2 95.00%.   PHYSICAL EXAM  Gen: NAD  Neck: no JVD  Chest: Clear breath sounds. No rales  Cardio: regular rate and rhythm, S1, S2 normal, no murmur, click, rub or gallop  GI: soft, non-tender; bowel sounds normal; no masses, no organomegaly  Extremities: extremities normal, atraumatic, no cyanosis or edema  Pulses: 2+ and symmetric  Neurologic: No focal deficit  Lab Results:   Lab 12/15/12 1258 12/14/12 1241 12/13/12 1520 12/13/12 0655  NA 144 141 -- 140  K 4.3 4.4 -- 4.5  CL 99 97 -- 99  CO2 26 28 -- 29  BUN 67* 63* -- 46*  CREATININE 20.41* 17.79* -- 14.32*  ALB -- -- -- --  GLUCOSE 124* -- -- --  CALCIUM 8.0* 7.7* -- 8.0*  PHOS 6.8* 6.9* 7.2* --     Basename 12/15/12 1258 12/14/12 1240  WBC 5.6 6.7  HGB 7.0* 7.1*  HCT 22.1* 22.4*  PLT 163 126*    Scheduled Meds:   . calcium acetate  2,001 mg Oral BID BM  . calcium acetate  2,668 mg Oral TID WC  . cinacalcet  180 mg Oral Q supper  . darbepoetin (ARANESP) injection - DIALYSIS  60 mcg Intravenous Q Fri-HD  . doxercalciferol  4.5 mcg Intravenous Q M,W,F-HD  . multivitamin  1 tablet Oral QHS  . ondansetron (ZOFRAN) IV  4 mg Intravenous Q6H   Continuous Infusions:  PRN Meds:.acetaminophen, hydrALAZINE, HYDROcodone-acetaminophen, promethazine  Assessment/Plan:   1. ESRD: Hemodialysis pt his schedule is MWF. HD yesterday. Cr up 20.41. K 4.3. - will have HD tomorrow.  2. 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 PTH 583.2 on 12/11/12.   3. Lower GI bleed: Hb 7.0 today s/p transfusion 2 units 12/12/12. 1 unit of PRBC's today. Colonoscopy scheduled for today.   4. Right Pleural effusion: s/p Thoracocentesis. Pt will have  CT to r/o malignancy-- F/u by Pulmonology   5. HTN: Hydralazine PRN.   LOS: 5 days   [name] [name], [name] [name] 12/16/2012,11:41 AM

## 2012-12-16 NOTE — Progress Notes (Signed)
[facility] PROGRESS NOTE  [name] [name] [id number] DOB: December 28, 1960 DOA: 12/11/2012 PCP: No primary provider on file.  Assessment/Plan  *Lower GI bleed, now resolved: No blood in BMs. - Appreciate GI assistance - Colonoscopy demonstrated nonbleeding diverticula and one sessile polyp which was biopsied  Acute blood loss anemia, hemoglobin responded appropriately to 1 unit transfusion yesterday -Cont erythropoietin   Chronic right pleural effusion.  Appears to be chronic. Thoracentesis done by PCCM on 1/3 and patient has persistent mild pleuritic chest pain.  Ct demonstrates loculation of the exudative effusion.  CT chest negative for mass. -  Cytology:  monocytes, and hemosiderin laden macrophages - Possible VATS per CT surgery, Dr. Roxan Hockey to see today  ESRD (end stage renal disease)  -  Appreciate nephrology assistance   Sinus tachycardia, becoming borderline tachycardic.  May be due to symptomatic anemia. - Possible blood transfusion in AM  Hypertension:  BP stable off of home medications. -PRN hydralazine ordered  Diet:  Renal dialysis Access:  fistula IVF:  none Proph:  SCDs  Code Status: full code Family Communication: spoke with patient alone at bedside Disposition Plan: pending medically stable   Consultants:  GI  Nephrology  PCCM  CT surgery  Procedures:  HD  Thoracentesis, right 1/3  CT chest 1/3  Colonoscopy 1/7  Antibiotics:  none   HPI/Subjective: Patient continues to have moderate pleuritic right chest and axillary pain.  Denies significant shortness of breath and cough.  Denies N/V/D/C.  Had multiple BMs overnight without blood during colonoscopy prep.     Objective: Filed Vitals:   12/16/12 0957 12/16/12 1000 12/16/12 1007 12/16/12 1355  BP: 123/77 123/77 129/85 131/78  Pulse:      Temp:    98.2 F (36.8 C)  TempSrc:    Oral  Resp: 32 42 9 22  Height:      Weight:      SpO2: 99% 95% 95% 97%    Intake/Output  Summary (Last 24 hours) at 12/16/12 1753 Last data filed at 12/16/12 1405  Gross per 24 hour  Intake    240 ml  Output      0 ml  Net    240 ml   Filed Weights   12/15/12 1232 12/15/12 1645 12/15/12 2121  Weight: 102.9 kg (226 lb 13.7 oz) 100.9 kg (222 lb 7.1 oz) 100.064 kg (220 lb 9.6 oz)    Exam:   General:  AAM, walking around room  HEENT:  MMM  Cardiovascular:  RRR, 1/6 murmur LSB, no rubs or gallops, 2+ pulses  Respiratory:  Diminished breath sounds on right.  CTA on left.  Abdomen:  NABS, soft, nondistended, nontender, no organomegaly  MSK:  Normal tone and bulk, no LEE  Neuro:  Grossly intact  Data Reviewed: Basic Metabolic Panel:  Lab [id number] 1039 12/15/12 1258 12/14/12 1241 12/13/12 1520 12/13/12 0655 12/12/12 1215 12/11/12 2350  NA 142 144 141 -- 140 141 --  K 4.4 4.3 4.4 -- 4.5 4.1 --  CL 99 99 97 -- 99 98 --  CO2 28 26 28  -- 29 30 --  GLUCOSE 92 124* 109* -- 87 83 --  BUN 27* 67* 63* -- 46* 32* --  CREATININE 11.98* 20.41* 17.79* -- 14.32* 10.70* --  CALCIUM 8.9 8.0* 7.7* -- 8.0* 8.4 --  MG -- -- -- -- -- -- --  PHOS 6.1* 6.8* 6.9* 7.2* -- -- 8.0*   Liver Function Tests:  Lab 12/16/12 1039 12/15/12 1258 12/14/12 1241 12/11/12 2350  AST -- -- -- 6  ALT -- -- -- 5  ALKPHOS -- -- -- 52  BILITOT -- -- -- 0.3  PROT -- -- -- 5.9*  ALBUMIN 3.3* 3.0* 3.0* 2.9*2.8*   No results found for this basename: LIPASE:5,AMYLASE:5 in the last 168 hours No results found for this basename: AMMONIA:5 in the last 168 hours CBC:  Lab 12/16/12 1039 12/15/12 1258 12/14/12 1240 12/13/12 1520 12/13/12 0655 12/12/12 1205 12/11/12 2350  WBC 5.0 5.6 6.7 7.7 7.0 -- --  NEUTROABS -- -- -- -- -- 3.5 4.7  HGB 8.0* 7.0* 7.1* 7.7* 7.2* -- --  HCT 25.1* 22.1* 22.4* 23.6* 22.1* -- --  MCV 93.3 91.3 90.7 91.1 89.8 -- --  PLT 163 163 126* 130* 138* -- --   Cardiac Enzymes:  Lab 12/13/12 0655 12/11/12 2350  CKTOTAL -- --  CKMB -- --  CKMBINDEX -- --  TROPONINI <0.30 <0.30     BNP (last 3 results) No results found for this basename: PROBNP:3 in the last 8760 hours CBG: No results found for this basename: GLUCAP:5 in the last 168 hours  Recent Results (from the past 240 hour(s))  MRSA PCR SCREENING     Status: Normal   Collection Time   12/11/12  9:23 PM      Component Value Range Status Comment   MRSA by PCR NEGATIVE  NEGATIVE Final      Studies: No results found.  Scheduled Meds:    . calcium acetate  2,001 mg Oral BID BM  . calcium acetate  2,668 mg Oral TID WC  . cinacalcet  180 mg Oral Q supper  . darbepoetin (ARANESP) injection - DIALYSIS  60 mcg Intravenous Q Fri-HD  . doxercalciferol  4.5 mcg Intravenous Q M,W,F-HD  . multivitamin  1 tablet Oral QHS  . ondansetron (ZOFRAN) IV  4 mg Intravenous Q6H   Continuous Infusions:    Principal Problem:  *Lower GI bleed Active Problems:  Hyperkalemia  Acute blood loss anemia  Sinus tachycardia, resolved with PRBC resuscitation  ESRD (end stage renal disease)  H/O: hypertension  Pleural effusion, right  Benign neoplasm of colon  Diverticulosis of colon (without mention of hemorrhage)    Time spent: 30 min    [name] [name], [facility] Pager [phone]. If 8PM-8AM, please contact night-coverage at [email or web address], password [facility] 12/16/2012, 5:53 PM  LOS: 5 days

## 2012-12-16 NOTE — Op Note (Signed)
[facility] [place], [address]   COLONOSCOPY PROCEDURE REPORT  PATIENT: [name] [name], [name] [name]  MR#: [id number] BIRTHDATE: 15-Aug-1961 , 51  yrs. old GENDER: Male ENDOSCOPIST: Milus Banister, MD REFERRED BY: [facility] PROCEDURE DATE:  12/16/2012 PROCEDURE:   Colonoscopy with snare polypectomy ASA CLASS:   Class II INDICATIONS:overt rectal bleeding, + NUC bleeding scan at [facility].  MEDICATIONS: Fentanyl 100 mcg IV and Versed 10 mg IV  DESCRIPTION OF PROCEDURE:   After the risks benefits and alternatives of the procedure were thoroughly explained, informed consent was obtained.  A digital rectal exam revealed no abnormalities of the rectum.   The Pentax Ped Colon [id number] endoscope was introduced through the anus and advanced to the cecum, which was identified by both the appendix and ileocecal valve. No adverse events experienced.   The quality of the prep was good.  The instrument was then slowly withdrawn as the colon was fully examined.  COLON FINDINGS: Multiple diverticulum in left colon.  There was one small sessile polyp in sigmoid colon, removed with cold snare and sent to pathology.  There was no recent or old blood in colon.  The examination was otherwise normal.  Retroflexed views revealed no abnormalities. The time to cecum=3 minutes 00 seconds.  Withdrawal time=10 minutes 00 seconds.  The scope was withdrawn and the procedure completed. COMPLICATIONS: There were no complications.  ENDOSCOPIC IMPRESSION: Multiple diverticulum in left colon.  There was one small sessile polyp in sigmoid colon, removed with cold snare and sent to pathology.  There was no recent or old blood in colon.  The examination was otherwise normal.  RECOMMENDATIONS: If the polyp(s) removed today are proven to be adenomatous (pre-cancerous) polyps, you will need a repeat colonoscopy in 5 years.  Otherwise you should continue to follow colorectal  cancer screening guidelines for "routine risk" patients with colonoscopy in 10 years.  You will receive a letter within 1-2 weeks with the results of your biopsy as well as final recommendations.  Please call my office if you have not received a letter after 3 weeks. He is ok to discharge home today.  The bleeding has stopped.   eSigned:  Milus Banister, MD 12/16/2012 9:42 AM   cc: Delfin Edis, MD

## 2012-12-17 ENCOUNTER — Other Ambulatory Visit: Payer: Self-pay

## 2012-12-17 ENCOUNTER — Encounter (HOSPITAL_COMMUNITY): Payer: Self-pay | Admitting: Gastroenterology

## 2012-12-17 LAB — COMPREHENSIVE METABOLIC PANEL
ALT: 8 U/L (ref 0–53)
AST: 13 U/L (ref 0–37)
Albumin: 3.3 g/dL — ABNORMAL LOW (ref 3.5–5.2)
Alkaline Phosphatase: 66 U/L (ref 39–117)
Glucose, Bld: 107 mg/dL — ABNORMAL HIGH (ref 70–99)
Potassium: 4 mEq/L (ref 3.5–5.1)
Sodium: 143 mEq/L (ref 135–145)
Total Protein: 7.3 g/dL (ref 6.0–8.3)

## 2012-12-17 LAB — PROTIME-INR
INR: 1.1 (ref 0.00–1.49)
Prothrombin Time: 14.1 seconds (ref 11.6–15.2)

## 2012-12-17 LAB — CBC
Hemoglobin: 8.1 g/dL — ABNORMAL LOW (ref 13.0–17.0)
MCHC: 31.4 g/dL (ref 30.0–36.0)
Platelets: 162 10*3/uL (ref 150–400)

## 2012-12-17 LAB — BLOOD GAS, ARTERIAL
Acid-Base Excess: 9.3 mmol/L — ABNORMAL HIGH (ref 0.0–2.0)
Bicarbonate: 33.8 mEq/L — ABNORMAL HIGH (ref 20.0–24.0)
TCO2: 35.4 mmol/L (ref 0–100)
pCO2 arterial: 51 mmHg — ABNORMAL HIGH (ref 35.0–45.0)
pO2, Arterial: 69.1 mmHg — ABNORMAL LOW (ref 80.0–100.0)

## 2012-12-17 LAB — TROPONIN I: Troponin I: <0.3 ng/mL (ref <0.30)

## 2012-12-17 MED ORDER — NEPRO/CARBSTEADY PO LIQD: 237.0000 mL | ORAL | Status: DC | PRN

## 2012-12-17 MED ORDER — PENTAFLUOROPROP-TETRAFLUOROETH EX AERO: 1.0000 "application " | INHALATION_SPRAY | CUTANEOUS | Status: DC | PRN

## 2012-12-17 MED ORDER — SODIUM CHLORIDE 0.9 % IV SOLN: 100.0000 mL | INTRAVENOUS | Status: DC | PRN

## 2012-12-17 MED ORDER — LIDOCAINE HCL (PF) 1 % IJ SOLN: 5.0000 mL | INTRAMUSCULAR | Status: DC | PRN

## 2012-12-17 MED ORDER — VANCOMYCIN HCL IN DEXTROSE 1-5 GM/200ML-% IV SOLN
1000.0000 mg | INTRAVENOUS | Status: AC
  Administered 2012-12-18: 1000 mg via INTRAVENOUS
  Filled 2012-12-17: qty 200

## 2012-12-17 MED ORDER — DOXERCALCIFEROL 4 MCG/2ML IV SOLN
INTRAVENOUS | Status: AC
  Administered 2012-12-17: 4.5 ug via INTRAVENOUS
  Filled 2012-12-17: qty 4

## 2012-12-17 MED ORDER — HEPARIN SODIUM (PORCINE) 1000 UNIT/ML DIALYSIS: 1000.0000 [IU] | INTRAMUSCULAR | Status: DC | PRN

## 2012-12-17 MED ORDER — ALTEPLASE 2 MG IJ SOLR: 2.0000 mg | Freq: Once | INTRAMUSCULAR | Status: DC | PRN

## 2012-12-17 MED ORDER — LIDOCAINE-PRILOCAINE 2.5-2.5 % EX CREA: 1.0000 "application " | TOPICAL_CREAM | CUTANEOUS | Status: DC | PRN

## 2012-12-17 NOTE — Progress Notes (Signed)
He feels well this evening.  Had HD today, no problems, no further GI bleeding.  I had originally planned to do his surgery on Friday, but the OR schedule will only allow [place] to do this tomorrow.  Plan Right VATS, drainage of effusion, possible decortication.  I discussed again with him the general nature of the procedure, need for general anesthesia and the incisions to be used. We also reviewed the indications, risks, benefits and alternatives. He understands the risks include but are not limited to death, MI, DVT, PE, bleeding, need for transfusion, infection, prolonged air leak, as well as other unforeseeable complications. He wishes to proceed. For OR tomorrow 2nd case.

## 2012-12-17 NOTE — Progress Notes (Signed)
[facility] PROGRESS NOTE  [name] [name] [id number] DOB: 11-20-1961 DOA: 12/11/2012 PCP: No primary provider on file.  Assessment/Plan: 1-Lower GI bleed, now resolved: No blood in BMs.  - Appreciate GI assistance  - Colonoscopy demonstrated nonbleeding diverticula and one sessile polyp which was biopsied  2-Acute blood loss anemia, hemoglobin responded appropriately to 1 unit transfusion yesterday  HB stable at 8. 3-Chronic right pleural effusion. Appears to be chronic. Thoracentesis done by PCCM on 1/3 and patient has persistent mild pleuritic chest pain. Ct demonstrates loculation of the exudative effusion. CT chest negative for mass.  - Cytology: monocytes, and hemosiderin laden macrophages  - VATS per CT surgery, tomorrow 1-09. 4-ESRD (end stage renal disease)  Continue with Dialysis. 5-Sinus tachycardia, becoming borderline tachycardic. May be due to symptomatic anemia. Stable.  6-Hypertension: BP stable off of home medications.  -PRN hydralazine  7-Chest pressure: started yesterday, Probably related to lung problem.EKG, Cycle cardiac enzymes, ECHO.    Code Status: Full Code. Family Communication: Care discussed with patient, and mother who was at bedside.  Disposition Plan: to be determine.   Consultants: GI  Nephrology  PCCM  CT surgery   Procedures: HD  Thoracentesis, right 1/3  CT chest 1/3  Colonoscopy 1/7   Antibiotics:  none  HPI/Subjective: Complaining of left side chest pressure. No chest pain on ambulation, but if he carry something he can have chest pain.   Objective: Filed Vitals:   12/17/12 1045 12/17/12 1109 12/17/12 1300 12/17/12 1347  BP: 121/72 128/77 135/69 135/76  Pulse: 106 105 98 100  Temp:  98.3 F (36.8 C) 99.1 F (37.3 C) 98.4 F (36.9 C)  TempSrc:  Oral  Oral  Resp: 18 18 18 18   Height:      Weight:  100.4 kg (221 lb 5.5 oz)    SpO2:  97% 98% 98%    Intake/Output Summary (Last 24 hours) at 12/17/12 1548 Last data  filed at 12/17/12 1109  Gross per 24 hour  Intake    240 ml  Output   1997 ml  Net  -1757 ml   Filed Weights   12/16/12 2155 12/17/12 0645 12/17/12 1109  Weight: 103.647 kg (228 lb 8 oz) 102.7 kg (226 lb 6.6 oz) 100.4 kg (221 lb 5.5 oz)    Exam:   General:  No distress.  Cardiovascular: S 1, S 2 RRR  Respiratory: Crackles bilateral  Abdomen: soft, NT  Data Reviewed: Basic Metabolic Panel:  Lab [id number] 1039 12/15/12 1258 12/14/12 1241 12/13/12 1520 12/13/12 0655 12/12/12 1215 12/11/12 2350  NA 142 144 141 -- 140 141 --  K 4.4 4.3 4.4 -- 4.5 4.1 --  CL 99 99 97 -- 99 98 --  CO2 28 26 28  -- 29 30 --  GLUCOSE 92 124* 109* -- 87 83 --  BUN 27* 67* 63* -- 46* 32* --  CREATININE 11.98* 20.41* 17.79* -- 14.32* 10.70* --  CALCIUM 8.9 8.0* 7.7* -- 8.0* 8.4 --  MG -- -- -- -- -- -- --  PHOS 6.1* 6.8* 6.9* 7.2* -- -- 8.0*   Liver Function Tests:  Lab 12/16/12 1039 12/15/12 1258 12/14/12 1241 12/11/12 2350  AST -- -- -- 6  ALT -- -- -- 5  ALKPHOS -- -- -- 52  BILITOT -- -- -- 0.3  PROT -- -- -- 5.9*  ALBUMIN 3.3* 3.0* 3.0* 2.9*2.8*   No results found for this basename: LIPASE:5,AMYLASE:5 in the last 168 hours No results found for this basename: AMMONIA:5  in the last 168 hours CBC:  Lab 12/16/12 1039 12/15/12 1258 12/14/12 1240 12/13/12 1520 12/13/12 0655 12/12/12 1205 12/11/12 2350  WBC 5.0 5.6 6.7 7.7 7.0 -- --  NEUTROABS -- -- -- -- -- 3.5 4.7  HGB 8.0* 7.0* 7.1* 7.7* 7.2* -- --  HCT 25.1* 22.1* 22.4* 23.6* 22.1* -- --  MCV 93.3 91.3 90.7 91.1 89.8 -- --  PLT 163 163 126* 130* 138* -- --   Cardiac Enzymes:  Lab 12/13/12 0655 12/11/12 2350  CKTOTAL -- --  CKMB -- --  CKMBINDEX -- --  TROPONINI <0.30 <0.30   BNP (last 3 results) No results found for this basename: PROBNP:3 in the last 8760 hours CBG: No results found for this basename: GLUCAP:5 in the last 168 hours  Recent Results (from the past 240 hour(s))  MRSA PCR SCREENING     Status: Normal    Collection Time   12/11/12  9:23 PM      Component Value Range Status Comment   MRSA by PCR NEGATIVE  NEGATIVE Final      Studies: No results found.  Scheduled Meds:   . calcium acetate  2,001 mg Oral BID BM  . calcium acetate  2,668 mg Oral TID WC  . cinacalcet  180 mg Oral Q supper  . darbepoetin (ARANESP) injection - DIALYSIS  60 mcg Intravenous Q Fri-HD  . doxercalciferol  4.5 mcg Intravenous Q M,W,F-HD  . multivitamin  1 tablet Oral QHS  . ondansetron (ZOFRAN) IV  4 mg Intravenous Q6H   Continuous Infusions:   Principal Problem:  *Lower GI bleed Active Problems:  Hyperkalemia  Acute blood loss anemia  Sinus tachycardia, resolved with PRBC resuscitation  ESRD (end stage renal disease)  H/O: hypertension  Pleural effusion, right  Benign neoplasm of colon  Diverticulosis of colon (without mention of hemorrhage)    Time spent: 35 minutes.    Tanyia Grabbe  [facility] Pager [phone]. If 8PM-8AM, please contact night-coverage at [email or web address], password [facility] 12/17/2012, 3:48 PM  LOS: 6 days

## 2012-12-17 NOTE — Progress Notes (Signed)
I have seen and examined this patient and agree with the plan of care   [facility] W 12/17/2012, 3:03 PM

## 2012-12-17 NOTE — Progress Notes (Signed)
[facility]  Subjective:  Pt on dialysis. Reports feeling ok. Denies GI bleeding.   Objective: Vital signs in last 24 hours: Blood pressure 128/77, pulse 105, temperature 98.3 F (36.8 C), temperature source Oral, resp. rate 18, height 6\' 1"  (1.854 m), weight 221 lb 5.5 oz (100.4 kg), SpO2 97.00%.   PHYSICAL EXAM  Gen: NAD  Neck: no JVD  Chest: Clear breath sounds. No rales  Cardio: regular rate and rhythm, S1, S2 normal, no murmur. GI: soft, non-tender; bowel sounds normal; no masses, no organomegaly  Extremities: extremities normal, atraumatic, no cyanosis or edema  Pulses: 2+ and symmetric  Neurologic: No focal deficit   Lab Results:   Lab 12/16/12 1039 12/15/12 1258 12/14/12 1241  NA 142 144 141  K 4.4 4.3 4.4  CL 99 99 97  CO2 28 26 28   BUN 27* 67* 63*  CREATININE 11.98* 20.41* 17.79*  ALB -- -- --  GLUCOSE 92 -- --  CALCIUM 8.9 8.0* 7.7*  PHOS 6.1* 6.8* 6.9*     Basename 12/16/12 1039 12/15/12 1258  WBC 5.0 5.6  HGB 8.0* 7.0*  HCT 25.1* 22.1*  PLT 163 163    Scheduled Meds:   . calcium acetate  2,001 mg Oral BID BM  . calcium acetate  2,668 mg Oral TID WC  . cinacalcet  180 mg Oral Q supper  . darbepoetin (ARANESP) injection - DIALYSIS  60 mcg Intravenous Q Fri-HD  . doxercalciferol  4.5 mcg Intravenous Q M,W,F-HD  . multivitamin  1 tablet Oral QHS  . ondansetron (ZOFRAN) IV  4 mg Intravenous Q6H   Continuous Infusions:  PRN Meds:.acetaminophen, hydrALAZINE, HYDROcodone-acetaminophen, promethazine  Assessment/Plan:  1. ESRD: Hemodialysis pt his schedule is MWF. Cr 11.8 K  4.4 from yesterday's labs. - HD today.   2. 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 PTH 583.2 on 12/11/12.   3. Lower GI bleed: Hb 8.0 yesterday. s/p transfusion 3 units total. Colonoscopy showed non bleeding diverticula and sessile polyp that was biopsied and sent to pathology.   4. Right Pleural effusion: s/p Thoracocentesis. Ct showed loculations. Possible VATS  -- F/u by Pulmonology and CT surgery.   5. HTN: Hydralazine PRN.     LOS: 6 days   PILOTO, DAYARMYS 12/17/2012,1:08 PM

## 2012-12-17 NOTE — Procedures (Signed)
Appears stable on dialysis with no issues

## 2012-12-18 ENCOUNTER — Inpatient Hospital Stay (HOSPITAL_COMMUNITY): Payer: Medicare Other

## 2012-12-18 ENCOUNTER — Encounter (HOSPITAL_COMMUNITY): Payer: Self-pay | Admitting: Certified Registered Nurse Anesthetist

## 2012-12-18 ENCOUNTER — Encounter (HOSPITAL_COMMUNITY)
Admission: EM | Disposition: A | Discharge status: Home Health | Payer: Self-pay | Source: Other Acute Inpatient Hospital | Attending: Thoracic Surgery (Cardiothoracic Vascular Surgery)

## 2012-12-18 ENCOUNTER — Encounter: Payer: Self-pay | Admitting: Gastroenterology

## 2012-12-18 ENCOUNTER — Inpatient Hospital Stay (HOSPITAL_COMMUNITY): Payer: Medicare Other | Admitting: Certified Registered Nurse Anesthetist

## 2012-12-18 DIAGNOSIS — N186 End stage renal disease: Secondary | ICD-10-CM

## 2012-12-18 DIAGNOSIS — I059 Rheumatic mitral valve disease, unspecified: Secondary | ICD-10-CM

## 2012-12-18 DIAGNOSIS — J90 Pleural effusion, not elsewhere classified: Secondary | ICD-10-CM

## 2012-12-18 HISTORY — PX: PLEURAL EFFUSION DRAINAGE: SHX5099

## 2012-12-18 HISTORY — PX: VIDEO ASSISTED THORACOSCOPY: SHX5073

## 2012-12-18 LAB — POCT I-STAT 3, ART BLOOD GAS (G3+)
Acid-Base Excess: 2 mmol/L (ref 0.0–2.0)
Bicarbonate: 27 mEq/L — ABNORMAL HIGH (ref 20.0–24.0)
Patient temperature: 98
TCO2: 28 mmol/L (ref 0–100)
pH, Arterial: 7.419 (ref 7.350–7.450)
pO2, Arterial: 123 mmHg — ABNORMAL HIGH (ref 80.0–100.0)

## 2012-12-18 LAB — POCT I-STAT 7, (LYTES, BLD GAS, ICA,H+H)
Acid-Base Excess: 3 mmol/L — ABNORMAL HIGH (ref 0.0–2.0)
Bicarbonate: 27.5 mEq/L — ABNORMAL HIGH (ref 20.0–24.0)
HCT: 21 % — ABNORMAL LOW (ref 39.0–52.0)
O2 Saturation: 100 %
Sodium: 141 mEq/L (ref 135–145)
pO2, Arterial: 183 mmHg — ABNORMAL HIGH (ref 80.0–100.0)

## 2012-12-18 LAB — POCT I-STAT 4, (NA,K, GLUC, HGB,HCT)
Glucose, Bld: 118 mg/dL — ABNORMAL HIGH (ref 70–99)
HCT: 25 % — ABNORMAL LOW (ref 39.0–52.0)
Hemoglobin: 8.5 g/dL — ABNORMAL LOW (ref 13.0–17.0)
Sodium: 141 mEq/L (ref 135–145)

## 2012-12-18 LAB — CBC
HCT: 25.9 % — ABNORMAL LOW (ref 39.0–52.0)
MCH: 28.9 pg (ref 26.0–34.0)
MCV: 90.2 fL (ref 78.0–100.0)
Platelets: 128 10*3/uL — ABNORMAL LOW (ref 150–400)
RBC: 2.87 MIL/uL — ABNORMAL LOW (ref 4.22–5.81)
WBC: 6.5 10*3/uL (ref 4.0–10.5)

## 2012-12-18 LAB — TROPONIN I: Troponin I: <0.3 ng/mL (ref <0.30)

## 2012-12-18 SURGERY — VIDEO ASSISTED THORACOSCOPY
Anesthesia: General | Site: Chest | Laterality: Right | Wound class: Clean Contaminated

## 2012-12-18 MED ORDER — SUCCINYLCHOLINE CHLORIDE 20 MG/ML IJ SOLN
INTRAMUSCULAR | Status: DC | PRN
  Administered 2012-12-18: 300 mg via INTRAVENOUS

## 2012-12-18 MED ORDER — OXYCODONE HCL 5 MG PO TABS: 5.0000 mg | ORAL_TABLET | Freq: Once | ORAL | Status: DC | PRN

## 2012-12-18 MED ORDER — HEPARIN SODIUM (PORCINE) 1000 UNIT/ML DIALYSIS: 1000.0000 [IU] | INTRAMUSCULAR | Status: DC | PRN

## 2012-12-18 MED ORDER — BISACODYL 5 MG PO TBEC
10.0000 mg | DELAYED_RELEASE_TABLET | Freq: Every day | ORAL | Status: DC
  Administered 2012-12-20 – 2012-12-23 (×4): 10 mg via ORAL
  Administered 2012-12-25: 5 mg via ORAL
  Administered 2012-12-26 – 2012-12-27 (×2): 10 mg via ORAL
  Filled 2012-12-18 (×2): qty 2
  Filled 2012-12-18 (×2): qty 1
  Filled 2012-12-18 (×3): qty 2

## 2012-12-18 MED ORDER — SODIUM CHLORIDE 0.9 % IV SOLN: 100.0000 mL | INTRAVENOUS | Status: DC | PRN

## 2012-12-18 MED ORDER — SODIUM CHLORIDE 0.9 % IV SOLN: INTRAVENOUS | Status: DC | PRN

## 2012-12-18 MED ORDER — MIDAZOLAM HCL 5 MG/5ML IJ SOLN
INTRAMUSCULAR | Status: DC | PRN
  Administered 2012-12-18: 2 mg via INTRAVENOUS

## 2012-12-18 MED ORDER — PROPOFOL 10 MG/ML IV BOLUS
INTRAVENOUS | Status: DC | PRN
  Administered 2012-12-18: 120 mg via INTRAVENOUS
  Administered 2012-12-18: 50 mg via INTRAVENOUS
  Administered 2012-12-18: 80 mg via INTRAVENOUS
  Administered 2012-12-18: 50 mg via INTRAVENOUS

## 2012-12-18 MED ORDER — HYDROMORPHONE HCL PF 1 MG/ML IJ SOLN: 0.2500 mg | INTRAMUSCULAR | Status: DC | PRN

## 2012-12-18 MED ORDER — MIDAZOLAM BOLUS VIA INFUSION
1.0000 mg | INTRAVENOUS | Status: DC | PRN
  Filled 2012-12-18: qty 2

## 2012-12-18 MED ORDER — FENTANYL CITRATE 0.05 MG/ML IJ SOLN
INTRAMUSCULAR | Status: AC
  Administered 2012-12-18: 100 ug via INTRAVENOUS
  Filled 2012-12-18: qty 2

## 2012-12-18 MED ORDER — VANCOMYCIN HCL IN DEXTROSE 1-5 GM/200ML-% IV SOLN
INTRAVENOUS | Status: AC
  Filled 2012-12-18: qty 200

## 2012-12-18 MED ORDER — FENTANYL CITRATE 0.05 MG/ML IJ SOLN
INTRAMUSCULAR | Status: DC | PRN
  Administered 2012-12-18 (×2): 50 ug via INTRAVENOUS
  Administered 2012-12-18 (×2): 150 ug via INTRAVENOUS
  Administered 2012-12-18 (×2): 50 ug via INTRAVENOUS
  Administered 2012-12-18: 150 ug via INTRAVENOUS
  Administered 2012-12-18: 100 ug via INTRAVENOUS

## 2012-12-18 MED ORDER — ALTEPLASE 2 MG IJ SOLR
2.0000 mg | Freq: Once | INTRAMUSCULAR | Status: AC | PRN
  Filled 2012-12-18: qty 2

## 2012-12-18 MED ORDER — ROCURONIUM BROMIDE 100 MG/10ML IV SOLN
INTRAVENOUS | Status: DC | PRN
  Administered 2012-12-18 (×2): 20 mg via INTRAVENOUS
  Administered 2012-12-18: 10 mg via INTRAVENOUS
  Administered 2012-12-18: 50 mg via INTRAVENOUS

## 2012-12-18 MED ORDER — LIDOCAINE HCL (CARDIAC) 20 MG/ML IV SOLN
INTRAVENOUS | Status: DC | PRN
  Administered 2012-12-18: 100 mg via INTRAVENOUS

## 2012-12-18 MED ORDER — OXYCODONE HCL 5 MG PO TABS
5.0000 mg | ORAL_TABLET | ORAL | Status: AC | PRN
  Administered 2012-12-19: 10 mg via ORAL
  Filled 2012-12-18: qty 2

## 2012-12-18 MED ORDER — CEFUROXIME SODIUM 1.5 G IJ SOLR
1.5000 g | Freq: Two times a day (BID) | INTRAMUSCULAR | Status: AC
  Administered 2012-12-18 – 2012-12-19 (×2): 1.5 g via INTRAVENOUS
  Filled 2012-12-18 (×2): qty 1.5

## 2012-12-18 MED ORDER — MIDAZOLAM HCL 2 MG/2ML IJ SOLN
1.0000 mg | INTRAMUSCULAR | Status: DC | PRN
  Administered 2012-12-18: 2 mg via INTRAVENOUS

## 2012-12-18 MED ORDER — PHENYLEPHRINE HCL 10 MG/ML IJ SOLN
10.0000 mg | INTRAVENOUS | Status: DC | PRN
  Administered 2012-12-18: 40 ug/min via INTRAVENOUS

## 2012-12-18 MED ORDER — MIDAZOLAM HCL 2 MG/2ML IJ SOLN
INTRAMUSCULAR | Status: AC
  Administered 2012-12-18: 2 mg via INTRAVENOUS
  Filled 2012-12-18: qty 2

## 2012-12-18 MED ORDER — FENTANYL CITRATE 0.05 MG/ML IJ SOLN
100.0000 ug | Freq: Once | INTRAMUSCULAR | Status: DC
  Administered 2012-12-18: 100 ug via INTRAVENOUS

## 2012-12-18 MED ORDER — ONDANSETRON HCL 4 MG/2ML IJ SOLN: 4.0000 mg | Freq: Once | INTRAMUSCULAR | Status: DC | PRN

## 2012-12-18 MED ORDER — LIDOCAINE HCL (PF) 1 % IJ SOLN: 5.0000 mL | INTRAMUSCULAR | Status: DC | PRN

## 2012-12-18 MED ORDER — SODIUM CHLORIDE 0.9 % IV SOLN
INTRAVENOUS | Status: DC | PRN
  Administered 2012-12-18 (×2): via INTRAVENOUS

## 2012-12-18 MED ORDER — FENTANYL CITRATE 0.05 MG/ML IJ SOLN
25.0000 ug/h | INTRAMUSCULAR | Status: DC
  Administered 2012-12-18: 50 ug/h via INTRAVENOUS
  Filled 2012-12-18: qty 50

## 2012-12-18 MED ORDER — DEXMEDETOMIDINE HCL IN NACL 400 MCG/100ML IV SOLN
0.4000 ug/kg/h | INTRAVENOUS | Status: AC
  Administered 2012-12-18: .7 ug/kg/h via INTRAVENOUS
  Filled 2012-12-18: qty 100

## 2012-12-18 MED ORDER — ONDANSETRON HCL 4 MG/2ML IJ SOLN
4.0000 mg | Freq: Four times a day (QID) | INTRAMUSCULAR | Status: DC | PRN
  Administered 2012-12-24: 4 mg via INTRAVENOUS
  Filled 2012-12-18 (×8): qty 2

## 2012-12-18 MED ORDER — FENTANYL BOLUS VIA INFUSION
25.0000 ug | Freq: Four times a day (QID) | INTRAVENOUS | Status: DC | PRN
  Filled 2012-12-18: qty 100

## 2012-12-18 MED ORDER — SODIUM CHLORIDE 0.9 % IV SOLN
1.0000 mg/h | INTRAVENOUS | Status: DC
  Administered 2012-12-18 – 2012-12-19 (×2): 1 mg/h via INTRAVENOUS
  Filled 2012-12-18 (×2): qty 10

## 2012-12-18 MED ORDER — OXYCODONE HCL 5 MG/5ML PO SOLN: 5.0000 mg | Freq: Once | ORAL | Status: DC | PRN

## 2012-12-18 MED ORDER — ACETAMINOPHEN 10 MG/ML IV SOLN
1000.0000 mg | Freq: Four times a day (QID) | INTRAVENOUS | Status: AC
  Administered 2012-12-19 (×4): 1000 mg via INTRAVENOUS
  Filled 2012-12-18 (×4): qty 100

## 2012-12-18 MED ORDER — NEPRO/CARBSTEADY PO LIQD: 237.0000 mL | ORAL | Status: DC | PRN

## 2012-12-18 MED ORDER — LABETALOL HCL 5 MG/ML IV SOLN
INTRAVENOUS | Status: DC | PRN
  Administered 2012-12-18 (×3): 10 mg via INTRAVENOUS

## 2012-12-18 MED ORDER — OXYCODONE-ACETAMINOPHEN 5-325 MG PO TABS
1.0000 | ORAL_TABLET | ORAL | Status: DC | PRN
  Administered 2012-12-19 – 2012-12-20 (×3): 1 via ORAL
  Administered 2012-12-20 – 2012-12-21 (×4): 2 via ORAL
  Administered 2012-12-22: 1 via ORAL
  Administered 2012-12-22 – 2012-12-23 (×3): 2 via ORAL
  Administered 2012-12-24 (×2): 1 via ORAL
  Administered 2012-12-25 – 2012-12-28 (×14): 2 via ORAL
  Administered 2012-12-29: 1 via ORAL
  Administered 2012-12-29: 2 via ORAL
  Administered 2012-12-29: 1 via ORAL
  Administered 2012-12-29 – 2012-12-30 (×4): 2 via ORAL
  Filled 2012-12-18 (×9): qty 2
  Filled 2012-12-18: qty 1
  Filled 2012-12-18: qty 2
  Filled 2012-12-18: qty 1
  Filled 2012-12-18 (×4): qty 2
  Filled 2012-12-18: qty 1
  Filled 2012-12-18 (×2): qty 2
  Filled 2012-12-18: qty 1
  Filled 2012-12-18 (×13): qty 2

## 2012-12-18 MED ORDER — PROMETHAZINE HCL 25 MG/ML IJ SOLN
6.2500 mg | INTRAMUSCULAR | Status: DC | PRN
  Administered 2012-12-20: 6.25 mg via INTRAVENOUS
  Filled 2012-12-18: qty 1

## 2012-12-18 MED ORDER — FENTANYL CITRATE 0.05 MG/ML IJ SOLN: 50.0000 ug | Freq: Once | INTRAMUSCULAR | Status: DC

## 2012-12-18 MED ORDER — SODIUM CHLORIDE 0.9 % IV SOLN
INTRAVENOUS | Status: DC | PRN
  Administered 2012-12-18: 11:00:00 via INTRAVENOUS

## 2012-12-18 MED ORDER — LIDOCAINE-PRILOCAINE 2.5-2.5 % EX CREA
1.0000 "application " | TOPICAL_CREAM | CUTANEOUS | Status: DC | PRN
  Filled 2012-12-18: qty 5

## 2012-12-18 MED ORDER — FENTANYL CITRATE 0.05 MG/ML IJ SOLN
INTRAMUSCULAR | Status: AC
  Filled 2012-12-18: qty 2

## 2012-12-18 MED ORDER — MORPHINE SULFATE 2 MG/ML IJ SOLN
2.0000 mg | INTRAMUSCULAR | Status: DC | PRN
  Administered 2012-12-19: 2 mg via INTRAVENOUS
  Filled 2012-12-18: qty 1

## 2012-12-18 MED ORDER — SENNOSIDES-DOCUSATE SODIUM 8.6-50 MG PO TABS
1.0000 | ORAL_TABLET | Freq: Every evening | ORAL | Status: DC | PRN
  Administered 2012-12-28: 1 via ORAL
  Filled 2012-12-18 (×2): qty 1

## 2012-12-18 MED ORDER — PENTAFLUOROPROP-TETRAFLUOROETH EX AERO: 1.0000 "application " | INHALATION_SPRAY | CUTANEOUS | Status: DC | PRN

## 2012-12-18 MED ORDER — MIDAZOLAM HCL 2 MG/2ML IJ SOLN
INTRAMUSCULAR | Status: AC
  Filled 2012-12-18: qty 2

## 2012-12-18 MED ORDER — POTASSIUM CHLORIDE 10 MEQ/50ML IV SOLN
10.0000 meq | Freq: Every day | INTRAVENOUS | Status: DC | PRN
  Filled 2012-12-18: qty 50

## 2012-12-18 MED ORDER — 0.9 % SODIUM CHLORIDE (POUR BTL) OPTIME
TOPICAL | Status: DC | PRN
  Administered 2012-12-18: 1000 mL
  Administered 2012-12-18: 2000 mL

## 2012-12-18 MED ORDER — DEXTROSE-NACL 5-0.45 % IV SOLN
INTRAVENOUS | Status: DC
  Administered 2012-12-19 – 2012-12-23 (×3): via INTRAVENOUS

## 2012-12-18 SURGICAL SUPPLY — 77 items
APPLIER CLIP ROT 10 11.4 M/L (STAPLE)
APR CLP MED LRG 11.4X10 (STAPLE)
BAG SPEC RTRVL LRG 6X4 10 (ENDOMECHANICALS)
CANISTER SUCTION 2500CC (MISCELLANEOUS) ×4 IMPLANT
CATH KIT ON Q 5IN SLV (PAIN MANAGEMENT) IMPLANT
CATH THORACIC 28FR (CATHETERS) IMPLANT
CATH THORACIC 28FR RT ANG (CATHETERS) ×2 IMPLANT
CATH THORACIC 36FR (CATHETERS) IMPLANT
CATH THORACIC 36FR RT ANG (CATHETERS) IMPLANT
CLIP APPLIE ROT 10 11.4 M/L (STAPLE) IMPLANT
CLIP TI MEDIUM 6 (CLIP) ×3 IMPLANT
CLOTH BEACON ORANGE TIMEOUT ST (SAFETY) ×2 IMPLANT
CONN ST 1/4X3/8  BEN (MISCELLANEOUS) ×3
CONN ST 1/4X3/8 BEN (MISCELLANEOUS) IMPLANT
CONN Y 3/8X3/8X3/8  BEN (MISCELLANEOUS) ×2
CONN Y 3/8X3/8X3/8 BEN (MISCELLANEOUS) ×1 IMPLANT
CONT SPEC 4OZ CLIKSEAL STRL BL (MISCELLANEOUS) ×6 IMPLANT
DRAIN CHANNEL 32F RND 10.7 FF (WOUND CARE) ×3 IMPLANT
DRAPE LAPAROSCOPIC ABDOMINAL (DRAPES) ×2 IMPLANT
DRAPE WARM FLUID 44X44 (DRAPE) ×3 IMPLANT
ELECT BLADE 6.5 EXT (BLADE) ×2 IMPLANT
ELECT REM PT RETURN 9FT ADLT (ELECTROSURGICAL) ×2
ELECTRODE REM PT RTRN 9FT ADLT (ELECTROSURGICAL) ×1 IMPLANT
GLOVE BIO SURGEON STRL SZ 6 (GLOVE) ×1 IMPLANT
GLOVE BIOGEL PI IND STRL 6.5 (GLOVE) IMPLANT
GLOVE BIOGEL PI IND STRL 7.5 (GLOVE) IMPLANT
GLOVE BIOGEL PI INDICATOR 6.5 (GLOVE) ×5
GLOVE BIOGEL PI INDICATOR 7.5 (GLOVE) ×1
GLOVE ECLIPSE 6.5 STRL STRAW (GLOVE) ×2 IMPLANT
GLOVE EUDERMIC 7 POWDERFREE (GLOVE) ×4 IMPLANT
GLOVE SURG SS PI 7.5 STRL IVOR (GLOVE) ×1 IMPLANT
GOWN PREVENTION PLUS XLARGE (GOWN DISPOSABLE) ×2 IMPLANT
GOWN STRL NON-REIN LRG LVL3 (GOWN DISPOSABLE) ×7 IMPLANT
HEMOSTAT SURGICEL 2X14 (HEMOSTASIS) IMPLANT
KIT BASIN OR (CUSTOM PROCEDURE TRAY) ×2 IMPLANT
KIT ROOM TURNOVER OR (KITS) ×2 IMPLANT
KIT SUCTION CATH 14FR (SUCTIONS) ×2 IMPLANT
NS IRRIG 1000ML POUR BTL (IV SOLUTION) ×5 IMPLANT
PACK CHEST (CUSTOM PROCEDURE TRAY) ×2 IMPLANT
PAD ARMBOARD 7.5X6 YLW CONV (MISCELLANEOUS) ×6 IMPLANT
PENCIL BUTTON HOLSTER BLD 10FT (ELECTRODE) ×2 IMPLANT
PENCIL FOOT CONTROL (ELECTRODE) ×1 IMPLANT
POUCH ENDO CATCH II 15MM (MISCELLANEOUS) IMPLANT
POUCH SPECIMEN RETRIEVAL 10MM (ENDOMECHANICALS) IMPLANT
SEALANT PROGEL (MISCELLANEOUS) IMPLANT
SEALANT SURG COSEAL 4ML (VASCULAR PRODUCTS) IMPLANT
SEALANT SURG COSEAL 8ML (VASCULAR PRODUCTS) IMPLANT
SOLUTION ANTI FOG 6CC (MISCELLANEOUS) ×4 IMPLANT
SPECIMEN JAR MEDIUM (MISCELLANEOUS) ×1 IMPLANT
SPONGE GAUZE 4X4 12PLY (GAUZE/BANDAGES/DRESSINGS) ×2 IMPLANT
SPONGE INTESTINAL PEANUT (DISPOSABLE) ×2 IMPLANT
SUT PROLENE 4 0 RB 1 (SUTURE)
SUT PROLENE 4-0 RB1 .5 CRCL 36 (SUTURE) IMPLANT
SUT SILK  1 MH (SUTURE) ×3
SUT SILK 1 MH (SUTURE) ×2 IMPLANT
SUT SILK 2 0SH CR/8 30 (SUTURE) IMPLANT
SUT SILK 3 0SH CR/8 30 (SUTURE) IMPLANT
SUT VIC AB 1 CTX 27 (SUTURE) ×1 IMPLANT
SUT VIC AB 1 CTX 36 (SUTURE)
SUT VIC AB 1 CTX36XBRD ANBCTR (SUTURE) IMPLANT
SUT VIC AB 2-0 CTX 36 (SUTURE) ×1 IMPLANT
SUT VIC AB 2-0 UR6 27 (SUTURE) ×1 IMPLANT
SUT VIC AB 3-0 MH 27 (SUTURE) IMPLANT
SUT VIC AB 3-0 X1 27 (SUTURE) ×4 IMPLANT
SUT VICRYL 2 TP 1 (SUTURE) IMPLANT
SWAB COLLECTION DEVICE MRSA (MISCELLANEOUS) IMPLANT
SYSTEM SAHARA CHEST DRAIN ATS (WOUND CARE) ×2 IMPLANT
TAPE CLOTH 4X10 WHT NS (GAUZE/BANDAGES/DRESSINGS) ×2 IMPLANT
TAPE CLOTH SURG 4X10 WHT LF (GAUZE/BANDAGES/DRESSINGS) ×1 IMPLANT
TIP APPLICATOR SPRAY EXTEND 16 (VASCULAR PRODUCTS) IMPLANT
TOWEL OR 17X24 6PK STRL BLUE (TOWEL DISPOSABLE) ×2 IMPLANT
TOWEL OR 17X26 10 PK STRL BLUE (TOWEL DISPOSABLE) ×2 IMPLANT
TRAP SPECIMEN MUCOUS 40CC (MISCELLANEOUS) ×2 IMPLANT
TRAY FOLEY CATH 14FRSI W/METER (CATHETERS) ×1 IMPLANT
TUBE ANAEROBIC SPECIMEN COL (MISCELLANEOUS) IMPLANT
WATER STERILE IRR 1000ML POUR (IV SOLUTION) ×4 IMPLANT
YANKAUER SUCT BULB TIP NO VENT (SUCTIONS) ×1 IMPLANT

## 2012-12-18 NOTE — Anesthesia Postprocedure Evaluation (Signed)
  Anesthesia Post-op Note  Patient: [name] [name]  Procedure(s) Performed: Procedure(s) (LRB) with comments: VIDEO ASSISTED THORACOSCOPY (Right) - pleural peel DRAINAGE OF PLEURAL EFFUSION (Right)  Patient Location: SICU  Anesthesia Type:General  Level of Consciousness: Patient remains intubated per anesthesia plan  Airway and Oxygen Therapy: Patient remains intubated per anesthesia plan and Patient placed on Ventilator (see vital sign flow sheet for setting)  Post-op Pain: none  Post-op Assessment: Post-op Vital signs reviewed, Patient's Cardiovascular Status Stable and Respiratory Function Stable  Post-op Vital Signs: Reviewed and stable  Complications: No apparent anesthesia complications

## 2012-12-18 NOTE — Interval H&P Note (Signed)
History and Physical Interval Note:  12/18/2012 1:42 PM  [name] [name]  has presented today for surgery, with the diagnosis of Right pleural effusion  The various methods of treatment have been discussed with the patient and family. After consideration of risks, benefits and other options for treatment, the patient has consented to  Procedure(s) (LRB) with comments: VIDEO ASSISTED THORACOSCOPY (Right) DRAINAGE OF PLEURAL EFFUSION (Right) as a surgical intervention .  The patient's history has been reviewed, patient examined, no change in status, stable for surgery.  I have reviewed the patient's chart and labs.  Questions were answered to the patient's satisfaction.     Edy Belt C

## 2012-12-18 NOTE — Preoperative (Signed)
Beta Blockers   Reason not to administer Beta Blockers:Not Applicable 

## 2012-12-18 NOTE — Progress Notes (Signed)
[facility]  Subjective:  NPO for surgery today. Denies GI bleeding, afebrile.  Objective: Vital signs in last 24 hours: Blood pressure 144/88, pulse 85, temperature 98.1 F (36.7 C), temperature source Oral, resp. rate 18, height 6\' 1"  (1.854 m), weight 221 lb 5.5 oz (100.4 kg), SpO2 97.00%.  PHYSICAL EXAM  Gen: NAD  Neck: no JVD  Chest: Clear breath sounds. No rales  Cardio: regular rate and rhythm, S1, S2 normal, no murmur.  GI: soft, non-tender; bowel sounds normal; no masses, no organomegaly  Extremities: extremities normal, atraumatic, no cyanosis or edema  Pulses: 2+ and symmetric  Neurologic: No focal deficit   Lab Results:   Lab 12/17/12 1846 12/16/12 1039 12/15/12 1258 12/14/12 1241  NA 143 142 144 --  K 4.0 4.4 4.3 --  CL 99 99 99 --  CO2 31 28 26  --  BUN 19 27* 67* --  CREATININE 8.41* 11.98* 20.41* --  ALB -- -- -- --  GLUCOSE 107* -- -- --  CALCIUM 9.0 8.9 8.0* --  PHOS -- 6.1* 6.8* 6.9*     Basename 12/17/12 1846 12/16/12 1039  WBC 6.4 5.0  HGB 8.1* 8.0*  HCT 25.8* 25.1*  PLT 162 163     Scheduled Meds:   . calcium acetate  2,001 mg Oral BID BM  . calcium acetate  2,668 mg Oral TID WC  . cinacalcet  180 mg Oral Q supper  . darbepoetin (ARANESP) injection - DIALYSIS  60 mcg Intravenous Q Fri-HD  . doxercalciferol  4.5 mcg Intravenous Q M,W,F-HD  . multivitamin  1 tablet Oral QHS  . ondansetron (ZOFRAN) IV  4 mg Intravenous Q6H  . vancomycin  1,000 mg Intravenous On Call to OR   Continuous Infusions:  PRN Meds:.acetaminophen, hydrALAZINE, HYDROcodone-acetaminophen, promethazine  Assessment/Plan: 1. ESRD: Hemodialysis pt his schedule is MWF. Cr 8.41  K 4.0  After HD yesterday.  - HD tomorrow. 2. 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 PTH 583.2 on 12/11/12.  3. Lower GI bleed: Hb. s/p transfusion 3 units total. Colonoscopy showed non bleeding diverticula and sessile polyp that was biopsied and sent to pathology.  4. Right Pleural  effusion: s/p Thoracocentesis. Ct showed loculations. Possible VATS today-- F/u by Pulmonology and CT surgery.  5. HTN: Hydralazine PRN.     LOS: 7 days   PILOTO, Thoams Siefert 12/18/2012,10:25 AM

## 2012-12-18 NOTE — H&P (Signed)
Reason for Consult:Right pleural effusion  Referring Physician: Dr. Jamie Brookes [name] [name] is an 52 y.o. male.  HPI: 52 yo with a history of ESRD, hypertension, polysubstance abuse and a chronic right pleural effusion who presented with an acute GI bleed. He had a positive tagged RBC scan at [facility]. He was transferred here and bleeding resolved. He had colonoscopy today which showed a small polyp and some diverticuli at the splenic flexure.  He has a history of a right pleural effusion and thoracentesis about 18 months ago at [facility]. He's not sure when it reaccumulated. He has been having increased SOB with exertion lately. He also c/o of a sensation of fullness and inability to take in a deep breath. He occasionally has right sided chest discomfort, usually when he coughs. A CXR on admission showed a large right pleural effusion. A CT showed this to be multiloculated. Thoracentesis drained 1.5 liters. There was a considerable effusion even after drainage. Studies were consistent with an exudate, cytology was negative.  Past Medical History   Diagnosis  Date   .  End-stage renal disease on hemodialysis    .  Secondary hyperparathyroidism    .  Hypertension    .  Polysubstance abuse    .  Anemia of chronic disease      BL Hgb ~10   .  Tobacco abuse     Past Surgical History   Procedure  Date   .  Surgery for horseshoe kidney     Family History   Problem  Relation  Age of Onset   .  Thyroid disease  Mother    .  Renal Disease      Social History: reports that he has been smoking Cigarettes. He has a 10 pack-year smoking history. He does not have any smokeless tobacco history on file. He reports that he drinks alcohol. He reports that he uses illicit drugs.  Allergies: No Known Allergies  Medications:  Prior to Admission:  Prescriptions prior to admission   Medication  Sig  Dispense  Refill   .  amLODipine (NORVASC) 10 MG tablet  Take 10 mg by mouth daily.     .  calcium acetate  (PHOSLO) 667 MG capsule  Take 2,668 mg by mouth 3 (three) times daily with meals.     .  calcium acetate (PHOSLO) 667 MG capsule  Take 2,001 mg by mouth with snacks.     .  cinacalcet (SENSIPAR) 90 MG tablet  Take 180 mg by mouth daily.     .  hydrochlorothiazide (HYDRODIURIL) 25 MG tablet  Take 25 mg by mouth daily.      Results for orders placed during the hospital encounter of 12/11/12 (from the past 48 hour(s))   RENAL FUNCTION PANEL Status: Abnormal    Collection Time    12/15/12 12:58 PM   Component  Value  Range  Comment    Sodium  144  135 - 145 mEq/L     Potassium  4.3  3.5 - 5.1 mEq/L     Chloride  99  96 - 112 mEq/L     CO2  26  19 - 32 mEq/L     Glucose, Bld  124 (*)  70 - 99 mg/dL     BUN  67 (*)  6 - 23 mg/dL     Creatinine, Ser  20.41 (*)  0.50 - 1.35 mg/dL     Calcium  8.0 (*)  8.4 - 10.5 mg/dL  Phosphorus  6.8 (*)  2.3 - 4.6 mg/dL     Albumin  3.0 (*)  3.5 - 5.2 g/dL     GFR calc non Af Amer  2 (*)  >90 mL/min     GFR calc Af Amer  3 (*)  >90 mL/min    CBC Status: Abnormal    Collection Time    12/15/12 12:58 PM   Component  Value  Range  Comment    WBC  5.6  4.0 - 10.5 K/uL     RBC  2.42 (*)  4.22 - 5.81 MIL/uL     Hemoglobin  7.0 (*)  13.0 - 17.0 g/dL     HCT  22.1 (*)  39.0 - 52.0 %     MCV  91.3  78.0 - 100.0 fL     MCH  28.9  26.0 - 34.0 pg     MCHC  31.7  30.0 - 36.0 g/dL     RDW  15.0  11.5 - 15.5 %     Platelets  163  150 - 400 K/uL    TYPE AND SCREEN Status: Normal    Collection Time    12/15/12 2:30 PM   Component  Value  Range  Comment    ABO/RH(D)  A POS      Antibody Screen  NEG      Sample Expiration  12/18/2012      Unit Number  [id number]      Blood Component Type  RED CELLS,LR      Unit division  00      Status of Unit  ISSUED,FINAL      Transfusion Status  OK TO TRANSFUSE      Crossmatch Result  Compatible     PREPARE RBC (CROSSMATCH) Status: Normal    Collection Time    12/15/12 2:30 PM   Component  Value  Range  Comment    Order  Confirmation  ORDER PROCESSED BY BLOOD BANK     CBC Status: Abnormal    Collection Time    12/16/12 10:39 AM   Component  Value  Range  Comment    WBC  5.0  4.0 - 10.5 K/uL     RBC  2.69 (*)  4.22 - 5.81 MIL/uL     Hemoglobin  8.0 (*)  13.0 - 17.0 g/dL     HCT  25.1 (*)  39.0 - 52.0 %     MCV  93.3  78.0 - 100.0 fL     MCH  29.7  26.0 - 34.0 pg     MCHC  31.9  30.0 - 36.0 g/dL     RDW  16.0 (*)  11.5 - 15.5 %     Platelets  163  150 - 400 K/uL    RENAL FUNCTION PANEL Status: Abnormal    Collection Time    12/16/12 10:39 AM   Component  Value  Range  Comment    Sodium  142  135 - 145 mEq/L     Potassium  4.4  3.5 - 5.1 mEq/L     Chloride  99  96 - 112 mEq/L     CO2  28  19 - 32 mEq/L     Glucose, Bld  92  70 - 99 mg/dL     BUN  27 (*)  6 - 23 mg/dL  DIALYSIS    Creatinine, Ser  11.98 (*)  0.50 - 1.35 mg/dL  DIALYSIS    Calcium  8.9  8.4 - 10.5 mg/dL     Phosphorus  6.1 (*)  2.3 - 4.6 mg/dL     Albumin  3.3 (*)  3.5 - 5.2 g/dL     GFR calc non Af Amer  4 (*)  >90 mL/min     GFR calc Af Amer  5 (*)  >90 mL/min     No results found.  Review of Systems  Constitutional: Negative for fever.  Poor appetite  Respiratory: Positive for cough and shortness of breath. Negative for sputum production and wheezing.  Cardiovascular: Positive for chest pain (right sided).  Gastrointestinal: Positive for blood in stool and melena.  Genitourinary:  Renal failure  Skin: Positive for itching.   Blood pressure 131/78, pulse 85, temperature 98.2 F (36.8 C), temperature source Oral, resp. rate 22, height 6\' 1"  (1.854 m), weight 220 lb 9.6 oz (100.064 kg), SpO2 97.00%.  Physical Exam  Vitals reviewed.  Constitutional: He is oriented to person, place, and time. He appears well-developed and well-nourished. No distress.  HENT:  Head: Normocephalic and atraumatic.  Eyes: EOM are normal. Pupils are equal, round, and reactive to light.  Neck: No thyromegaly present.  Cardiovascular: Normal rate,  regular rhythm and normal heart sounds.  No murmur heard.  Respiratory:  Diminished BS on Right, dullness to percussion  GI: Soft. There is no tenderness.  Musculoskeletal: He exhibits no edema.  Graft right arm  Lymphadenopathy:  He has no cervical adenopathy.  Neurological: He is alert and oriented to person, place, and time. No cranial nerve deficit.  Skin: Skin is warm and dry.   CT Chest  End-stage renal disease. On dialysis. Anemia of chronic disease.  Polysubstance abuse.  CT CHEST WITHOUT CONTRAST  Technique: Multidetector CT imaging of the chest was performed  following the standard protocol without IV contrast.  Comparison: Portable chest obtained today.  Findings: Extensive bilateral subcutaneous varices. Moderate-sized  right pleural effusion with a peripheral higher density component  separated from the remainder of the pleural fluid by a thin  membrane. The peripheral component measures 1 cm in maximum  thickness. No pleural fluid on the left. There are rounded mass-  like densities in the right upper lobe with an appearance  compatible with rounded atelectasis. No true lung masses and no  enlarged lymph nodes are seen. The included portions of the  kidneys are small with multiple small cysts on the left. The bones  are diffusely sclerotic.  IMPRESSION:  1. Moderate-sized right pleural effusion with a peripheral  slightly higher density component separated by a thin membrane.  2. Areas of rounded atelectasis in the right upper lobe.  3. Extensive bilateral subcutaneous varices.  4. Diffuse bone sclerosis. This can be the result of secondary  hyperparathyroidism related to the patient's chronic renal disease.  Assessment/Plan:  52 year old dialysis patient with a chronic right pleural effusion. Although we don't have a detailed knowledge of it's time course, we do know that it is now loculated and exudative in nature. This will need a VATS to completely drain the  effusion and reexpand the lung. A decortication may also be necessary. This is somewhat complicated given that his acute presentation was for a GI bleed not the pleural effusion.  I did discuss with him the proposed procedure which is Right VATS, drainage of pleural effusion and possible decortication. I discussed the general nature of the procedure, the need for general anesthesia, the expected hospital stay and overall recovery. We discussed the  indications, risks, benefits, alternatives and odds of success. He understands the risks include, but are not limited to death, Mi, DVT, PE, bleeding, possible need for transfusion, infection, air leak, recurrent effusion, cardiac arrhythmias, as well as unforeseeable complications.  Will need to discuss whether to address during this admission for GI bleed or whether to have him return later.  HENDRICKSON,STEVEN C  12/16/2012, 5:58 PM

## 2012-12-18 NOTE — Progress Notes (Signed)
I have seen and examined this patient and agree with the plan of care  [facility] W 12/18/2012, 12:55 PM

## 2012-12-18 NOTE — Progress Notes (Signed)
Xray showed tube almost out. DR told me to advance to 29 at the lip.

## 2012-12-18 NOTE — Progress Notes (Signed)
Patient ID: [name] [name], male   DOB: 1961/04/09, 52 y.o.   MRN: [id number]  SICU Evening Rounds:  Hemodynamically stable Intubated and sedated.  Initial CXR showed ETT almost out so it was advanced. Follow up CXR shows tube in good position.  Hgb low in OR. Will repeat now.  ABG now good.  CT output 200 first hr but decreasing. 1-2 chamber air leak.  Plan to keep on vent overnight.

## 2012-12-18 NOTE — Progress Notes (Signed)
I spoke with DR Roxan Hockey, he will take over patient care. I dint see patient today.  Please call us as needed.  Niel Hummer, MD. [facility] hospitalist.

## 2012-12-18 NOTE — Transfer of Care (Signed)
Immediate Anesthesia Transfer of Care Note  Patient: [name] [name]  Procedure(s) Performed: Procedure(s) (LRB) with comments: VIDEO ASSISTED THORACOSCOPY (Right) - pleural peel DRAINAGE OF PLEURAL EFFUSION (Right)  Patient Location: SICU  Anesthesia Type:General  Level of Consciousness: Patient remains intubated per anesthesia plan  Airway & Oxygen Therapy: Patient placed on Ventilator (see vital sign flow sheet for setting)  Post-op Assessment: Report given to PACU RN and Post -op Vital signs reviewed and stable  Post vital signs: Reviewed and stable  Complications: No apparent anesthesia complications

## 2012-12-18 NOTE — Anesthesia Procedure Notes (Signed)
Procedures

## 2012-12-18 NOTE — Brief Op Note (Addendum)
12/11/2012 - 12/18/2012  5:56 PM  PATIENT:  [name] [name]  52 y.o. male  PRE-OPERATIVE DIAGNOSIS:  Chronic right pleural effusion  POST-OPERATIVE DIAGNOSIS:  Chronic right pleural effusion  PROCEDURE:   RIGHT VIDEO ASSISTED THORACOSCOPY  DRAINAGE OF PLEURAL EFFUSION  DECORTICATION RIGHT LUNG  SURGEON:  Surgeon(s): Melrose Nakayama, MD  ASSISTANT: Jadene Pierini, PA-C, Suzzanne Cloud, PA-C  ANESTHESIA:   general  SPECIMEN:  Source of Specimen:  Right pleura  DISPOSITION OF SPECIMEN:  Pathology  DRAINS: 60 Blake drains x 3  PATIENT CONDITION:  ICU - intubated and hemodynamically stable.  Findings- 3.5 Liters bloody pleural fluid. Thickened parietal pleura- frozen= inflammatory. Lung encased in thick fibrous peel. Unable to completely reexpand. Will keep intubated overnight to maintain + pressure on lung

## 2012-12-18 NOTE — Anesthesia Preprocedure Evaluation (Addendum)
Anesthesia Evaluation  Patient identified by MRN, date of birth, ID band Patient awake    Reviewed: Allergy & Precautions, H&P , NPO status , Patient's Chart, lab work & pertinent test results  Airway Mallampati: I TM Distance: >3 FB Neck ROM: full    Dental  (+) Poor Dentition, Loose and Dental Advisory Given   Pulmonary COPDCurrent Smoker,  + rhonchi   + decreased breath sounds+ wheezing      Cardiovascular hypertension, Pt. on medications Rhythm:regular Rate:Normal     Neuro/Psych    GI/Hepatic (+)     substance abuse  alcohol use and marijuana use,   Endo/Other    Renal/GU ESRF and DialysisRenal disease     Musculoskeletal   Abdominal   Peds  Hematology  (+) anemia ,   Anesthesia Other Findings Poly substance Abuse Pleural effusion Pt states all of his teeth are loose  Reproductive/Obstetrics                         Anesthesia Physical Anesthesia Plan  ASA: III  Anesthesia Plan: General   Post-op Pain Management:    Induction: Intravenous  Airway Management Planned: Double Lumen EBT  Additional Equipment: Arterial line and CVP  Intra-op Plan:   Post-operative Plan: Extubation in OR and Possible Post-op intubation/ventilation  Informed Consent: I have reviewed the patients History and Physical, chart, labs and discussed the procedure including the risks, benefits and alternatives for the proposed anesthesia with the patient or authorized representative who has indicated his/her understanding and acceptance.     Plan Discussed with: CRNA, Surgeon and Anesthesiologist  Anesthesia Plan Comments:       Anesthesia Quick Evaluation

## 2012-12-18 NOTE — Progress Notes (Signed)
  Echocardiogram 2D Echocardiogram has been performed.  Basilia Jumbo 12/18/2012, 9:43 AM

## 2012-12-19 ENCOUNTER — Inpatient Hospital Stay (HOSPITAL_COMMUNITY): Payer: Medicare Other

## 2012-12-19 ENCOUNTER — Encounter (HOSPITAL_COMMUNITY): Payer: Self-pay | Admitting: Thoracic Surgery (Cardiothoracic Vascular Surgery)

## 2012-12-19 LAB — CBC
Hemoglobin: 8.3 g/dL — ABNORMAL LOW (ref 13.0–17.0)
MCH: 29 pg (ref 26.0–34.0)
MCV: 87.1 fL (ref 78.0–100.0)
Platelets: 143 10*3/uL — ABNORMAL LOW (ref 150–400)
RBC: 2.86 MIL/uL — ABNORMAL LOW (ref 4.22–5.81)
WBC: 6.4 10*3/uL (ref 4.0–10.5)

## 2012-12-19 LAB — POCT I-STAT 3, ART BLOOD GAS (G3+)
Acid-Base Excess: 6 mmol/L — ABNORMAL HIGH (ref 0.0–2.0)
Acid-Base Excess: 5 mmol/L — ABNORMAL HIGH (ref 0.0–2.0)
Bicarbonate: 26.9 mEq/L — ABNORMAL HIGH (ref 20.0–24.0)
O2 Saturation: 100 %
O2 Saturation: 100 %
Patient temperature: 100.3
Patient temperature: 100.3
TCO2: 27 mmol/L (ref 0–100)
TCO2: 28 mmol/L (ref 0–100)
pCO2 arterial: 22.2 mmHg — ABNORMAL LOW (ref 35.0–45.0)
pH, Arterial: 7.566 — ABNORMAL HIGH (ref 7.350–7.450)

## 2012-12-19 LAB — BASIC METABOLIC PANEL
CO2: 23 mEq/L (ref 19–32)
Calcium: 8.3 mg/dL — ABNORMAL LOW (ref 8.4–10.5)
Chloride: 101 mEq/L (ref 96–112)
Glucose, Bld: 101 mg/dL — ABNORMAL HIGH (ref 70–99)
Sodium: 139 mEq/L (ref 135–145)

## 2012-12-19 LAB — TYPE AND SCREEN
ABO/RH(D): A POS
Antibody Screen: NEGATIVE
Unit division: 0

## 2012-12-19 MED ORDER — DOXERCALCIFEROL 4 MCG/2ML IV SOLN
INTRAVENOUS | Status: AC
  Administered 2012-12-19: 4.5 ug via INTRAVENOUS
  Filled 2012-12-19: qty 4

## 2012-12-19 MED ORDER — ONDANSETRON HCL 4 MG/2ML IJ SOLN: 4.0000 mg | Freq: Four times a day (QID) | INTRAMUSCULAR | Status: DC | PRN

## 2012-12-19 MED ORDER — DARBEPOETIN ALFA-POLYSORBATE 60 MCG/0.3ML IJ SOLN
INTRAMUSCULAR | Status: AC
  Administered 2012-12-19: 60 ug via INTRAVENOUS
  Filled 2012-12-19: qty 0.3

## 2012-12-19 MED ORDER — DIPHENHYDRAMINE HCL 12.5 MG/5ML PO ELIX
12.5000 mg | ORAL_SOLUTION | Freq: Four times a day (QID) | ORAL | Status: DC | PRN
  Filled 2012-12-19: qty 5

## 2012-12-19 MED ORDER — SODIUM CHLORIDE 0.9 % IJ SOLN
9.0000 mL | INTRAMUSCULAR | Status: DC | PRN
  Administered 2012-12-26: 10 mL via INTRAVENOUS
  Administered 2012-12-29: 9 mL via INTRAVENOUS

## 2012-12-19 MED ORDER — FENTANYL 10 MCG/ML IV SOLN
INTRAVENOUS | Status: DC
  Administered 2012-12-19: 105 ug via INTRAVENOUS
  Administered 2012-12-19: 130.8 ug via INTRAVENOUS
  Administered 2012-12-19: 11:00:00 via INTRAVENOUS
  Administered 2012-12-19: 240 ug via INTRAVENOUS
  Administered 2012-12-19: 20:00:00 via INTRAVENOUS
  Administered 2012-12-20: 120 ug via INTRAVENOUS
  Administered 2012-12-20: 90 ug via INTRAVENOUS
  Administered 2012-12-20: 147.9 ug via INTRAVENOUS
  Administered 2012-12-20: 480 ug via INTRAVENOUS
  Administered 2012-12-20: 145.3 ug via INTRAVENOUS
  Administered 2012-12-21: 45 ug via INTRAVENOUS
  Administered 2012-12-21: 60 ug via INTRAVENOUS
  Administered 2012-12-21: 07:00:00 via INTRAVENOUS
  Administered 2012-12-21: 105 ug via INTRAVENOUS
  Administered 2012-12-21: 135 ug via INTRAVENOUS
  Administered 2012-12-21: 120 ug via INTRAVENOUS
  Administered 2012-12-21: 260 ug via INTRAVENOUS
  Administered 2012-12-22: 30 ug via INTRAVENOUS
  Administered 2012-12-22: 14:00:00 via INTRAVENOUS
  Administered 2012-12-22: 75 ug via INTRAVENOUS
  Administered 2012-12-22: 90.99 ug via INTRAVENOUS
  Administered 2012-12-22: 49 ug via INTRAVENOUS
  Administered 2012-12-22: 90 ug via INTRAVENOUS
  Administered 2012-12-22: 62 ug via INTRAVENOUS
  Administered 2012-12-23: 90 ug via INTRAVENOUS
  Administered 2012-12-23: 16:00:00 via INTRAVENOUS
  Administered 2012-12-23: 30 ug via INTRAVENOUS
  Administered 2012-12-23: 59.75 ug via INTRAVENOUS
  Administered 2012-12-23 (×2): 45 ug via INTRAVENOUS
  Administered 2012-12-23: 90 ug via INTRAVENOUS
  Administered 2012-12-24: 105 ug via INTRAVENOUS
  Administered 2012-12-24: 75 ug via INTRAVENOUS
  Administered 2012-12-24: 90 ug via INTRAVENOUS
  Administered 2012-12-24: 45 ug via INTRAVENOUS
  Administered 2012-12-24: 15 ug via INTRAVENOUS
  Administered 2012-12-24: 120 ug via INTRAVENOUS
  Administered 2012-12-25: 75 ug via INTRAVENOUS
  Administered 2012-12-25: 45 ug via INTRAVENOUS
  Administered 2012-12-25: 165 ug via INTRAVENOUS
  Administered 2012-12-25: 45 ug via INTRAVENOUS
  Administered 2012-12-25: 23:00:00 via INTRAVENOUS
  Administered 2012-12-25: 195 ug via INTRAVENOUS
  Administered 2012-12-25: 30 ug via INTRAVENOUS
  Administered 2012-12-26: 165 ug via INTRAVENOUS
  Administered 2012-12-26: 15 ug via INTRAVENOUS
  Administered 2012-12-26: 150 ug via INTRAVENOUS
  Administered 2012-12-26: 23:00:00 via INTRAVENOUS
  Administered 2012-12-27: 240 ug via INTRAVENOUS
  Filled 2012-12-19 (×11): qty 50

## 2012-12-19 MED ORDER — DIPHENHYDRAMINE HCL 50 MG/ML IJ SOLN: 12.5000 mg | Freq: Four times a day (QID) | INTRAMUSCULAR | Status: DC | PRN

## 2012-12-19 MED ORDER — NALOXONE HCL 0.4 MG/ML IJ SOLN: 0.4000 mg | INTRAMUSCULAR | Status: DC | PRN

## 2012-12-19 MED ORDER — CALCIUM ACETATE 667 MG PO CAPS
2001.0000 mg | ORAL_CAPSULE | Freq: Two times a day (BID) | ORAL | Status: DC | PRN
  Administered 2012-12-24: 1334 mg via ORAL
  Administered 2012-12-26: 2001 mg via ORAL
  Administered 2012-12-26: 1334 mg via ORAL
  Filled 2012-12-19: qty 3

## 2012-12-19 NOTE — Significant Event (Signed)
1100am-Wasted approximately 200cc of fentanyl and approximately 40cc of versed in sink and flush. Wasted these sedation medications with Kelli Hope, RN.

## 2012-12-19 NOTE — Addendum Note (Signed)
Addendum  created 12/19/12 0824 by Carney Living, CRNA   Modules edited:Charges VN

## 2012-12-19 NOTE — Progress Notes (Signed)
ABG reviewed; respiratory alkalosis   Decrease TV and repeat ABG in 1 h

## 2012-12-19 NOTE — Progress Notes (Signed)
1 Day Post-Op Procedure(s) (LRB): VIDEO ASSISTED THORACOSCOPY (Right) DRAINAGE OF PLEURAL EFFUSION (Right) Subjective: Intubated, but writing notes  Objective: Vital signs in last 24 hours: Temp:  [97.3 F (36.3 C)-100.3 F (37.9 C)] 100.3 F (37.9 C) (01/10 0412) Pulse Rate:  [59-121] 89  (01/10 0741) Cardiac Rhythm:  [-] Normal sinus rhythm (01/10 0500) Resp:  [0-30] 29  (01/10 0741) BP: (81-144)/(52-88) 115/56 mmHg (01/10 0741) SpO2:  [97 %-100 %] 100 % (01/10 0741) Arterial Line BP: (76-154)/(42-75) 110/50 mmHg (01/10 0700) FiO2 (%):  [40 %-99.9 %] 40 % (01/10 0741)  Hemodynamic parameters for last 24 hours:    Intake/Output from previous day: 01/09 0701 - 01/10 0700 In: 2777.7 [I.V.:1793.7; Blood:700; NG/GT:30; IV Piggyback:254] Out: [id number] [Emesis/NG output:700; Blood:1150; Chest Tube:720] Intake/Output this shift:    General appearance: alert and mild distress Neurologic: intact Heart: regular rate and rhythm Lungs: coarse BS bilaterally Abdomen: normal findings: soft, non-tender  Lab Results:  Basename 12/19/12 0415 12/18/12 2026 12/18/12 2025  WBC 6.4 -- 6.5  HGB 8.3* 8.5* --  HCT 24.9* 25.0* --  PLT 143* -- 128*   BMET:  Basename 12/19/12 0415 12/18/12 2026 12/17/12 1846  NA 139 141 --  K 4.8 4.1 --  CL 101 -- 99  CO2 23 -- 31  GLUCOSE 101* 118* --  BUN 35* -- 19  CREATININE 12.15* -- 8.41*  CALCIUM 8.3* -- 9.0    PT/INR:  Basename 12/17/12 1846  LABPROT 14.1  INR 1.10   ABG    Component Value Date/Time   PHART 7.566* 12/19/2012 0535   HCO3 26.9* 12/19/2012 0535   TCO2 28 12/19/2012 0535   O2SAT 100.0 12/19/2012 0535   CBG (last 3)  No results found for this basename: GLUCAP:3 in the last 72 hours  Assessment/Plan: S/P Procedure(s) (LRB): VIDEO ASSISTED THORACOSCOPY (Right) DRAINAGE OF PLEURAL EFFUSION (Right) POD # 1 Decortication CXR shows a good result overall, but there is still some significant atelectasis in the right lung  Small  air leak keep CT to suction'  Wean to extubate  OOB, ambulate post extubation   LOS: 8 days    Artemisia Auvil C 12/19/2012

## 2012-12-19 NOTE — Progress Notes (Signed)
ABG reviewed; the patient with tachypnea and respiratory alkalosis, somewhat improved   Increase sedation; transition to PS asap

## 2012-12-19 NOTE — Progress Notes (Signed)
[facility] BRIEF SICU PROGRESS NOTE  1 Day Post-Op  S/P Procedure(s) (LRB): VIDEO ASSISTED THORACOSCOPY (Right) DRAINAGE OF PLEURAL EFFUSION (Right)   Just finished dialysis Sinus tach, BP 85 systolic Breathing comfortably  Plan: Continue current plan  Cheyene Hamric H 12/19/2012 7:40 PM

## 2012-12-19 NOTE — Op Note (Signed)
[name] [name], [name] [name]                 ACCOUNT NO.:  [id number]  MEDICAL RECORD NO.:  [id number]  LOCATION:  2306                         FACILITY:  [place]  PHYSICIAN:  Revonda Standard. Roxan Hockey, M.D.DATE OF BIRTH:  04-01-1961  DATE OF PROCEDURE:  12/18/2012 DATE OF DISCHARGE:                              OPERATIVE REPORT   PREOPERATIVE DIAGNOSIS:  Loculated right pleural effusion.  POSTOPERATIVE DIAGNOSIS:  Loculated right pleural effusion.  PROCEDURE:  Right video-assisted thoracoscopy, drainage of pleural effusion, decortication of right lung.  SURGEON:  Revonda Standard. Roxan Hockey, MD  ASSISTANT:  John Giovanni, PA-C  SECOND ASSISTANT:  Suzzanne Cloud, PA  ANESTHESIA:  General.  FINDINGS:  3.5 L of bloody pleural fluid.  Markedly thickened and fibrotic parietal pleura.  Frozen section showed inflammatory changes, no evidence of malignancy.  Lung completely encased in a thick fibrous peel.  Unable to get lung to completely re-expand.  Multiple air leaks after decortication.  CLINICAL NOTE:  [name] [name] is a 52 year old gentleman with renal failure who had a known pleural effusion, a year and a half ago. It was drained with thoracentesis at an outside hospital.  It was unclear whether there had been additional followup after that intervention.  He was admitted to [facility] with a lower GI bleed.  He was ultimately found to have diverticulitis in his colon at the splenic flexure.  His GI bleeding resolved.  During his hospitalization, he had a chest x-ray, which showed a large complex pleural effusion.  A CT scan confirmed the findings.  He had a thoracentesis done where bloody fluid was obtained. He had minimal symptomatic benefit and no significant change in his chest x- ray afterwards.  He was advised to undergo right VATS for drainage of the pleural effusion and possible decortication.  The indications, risks, benefits, and alternatives were discussed in detail with the patient.  He  understood and accepted the risk and agreed to proceed.  OPERATIVE NOTE:  [name] [name] was brought to the preoperative holding area on December 18, 2012.  There Anesthesia placed a central line and arterial blood pressure monitoring line.  He was taken to the operating room. Intravenous antibiotics were administered.  PAS hose were placed for DVT prophylaxis.  He was anesthetized and intubated with a double-lumen endotracheal tube.  He was placed in a left lateral decubitus position and the right chest was prepped and draped in usual sterile fashion.  An incision was made in the 8th intercostal space in the midaxillary line and was carried through the skin and subcutaneous tissue.  The chest was entered bluntly using hemostat.  This was very difficult due to a great deal of resistance at the parietal pleura.  A sucker was placed into the chest and 3.5 L of bloody pleural fluid were evacuated, a portion was sent for cultures and an additional sample was sent for cytology.  The scope was inserted and there was marked, very uniform thickening of the parietal pleura.  The lung was encased in a dense fibrous peel and would not re-expand with attempts to ventilate the lung.  A small utility incision was made anterolaterally.  Second port type incision was made  next to the original port for retraction purposes.  A portion of the parietal pleural peel was stripped and sent for frozen section, which revealed inflammatory changes.  There was no evidence of tumor.  Attention then was turned to the lung.  It took a considerable period of time to create a plane as the fibrotic peel was essentially one and the same with the visceral pleura.  Ultimately a plane was created on the lower lobe and the peel was stripped from the lung. There were multiple areas of denuded lung with air leaks.  The lung was intermittently ventilated as the peel was stripped.  The lung was densely adherent to the mediastinum both  anteriorly and posteriorly as well as being adherent to the apex superiorly.  There were multiple areas where the lung was invaginated. Overall the decortication was very tedious and difficult.  After completion of the decortication, the lung filled approximately 80-90% of the chest volume but it did not achieve 100% re-expansion.  The chest was copiously irrigated with saline. Hemostasis was achieved as possible.  Three 32-French Blake drains were placed posteriorly and superiorly, posteriorly along the diaphragm and then anteriorly and superiorly.  All were secured with #1 silk sutures.  The utility incision was closed with a running #1 Vicryl fascial suture, 2-0 Vicryl subcutaneous suture, and 3-0 Vicryl subcuticular suture.  All sponge, needle, and instrument counts were correct at the end of the procedure.  The patient was placed back in a supine position.  The double-lumen tube was removed and replaced with a single-lumen endotracheal tube.  The patient will be transported to the surgical intensive care unit where he will be maintained on positive pressure ventilation overnight with a plan to wean from the ventilator tomorrow.     Revonda Standard Roxan Hockey, M.D.     SCH/MEDQ  D:  12/18/2012  T:  12/19/2012  Job:  [id number]

## 2012-12-19 NOTE — Progress Notes (Signed)
I have seen and examined this patient and agree with the plan of care   [facility] W 12/19/2012, 1:07 PM

## 2012-12-19 NOTE — Progress Notes (Signed)
INITIAL NUTRITION ASSESSMENT  DOCUMENTATION CODES Per approved criteria  -Not Applicable   INTERVENTION:  If EN started, recommend Osmolite 1.2 formula -- initiate at 20 ml/hr and increase by 10 ml every 4 hours to goal rate of 70 ml/hr with Prostat liquid protein 30 ml twice daily to provide 2216 kcals, 123 gm protein, 1378 ml of free water               RD to follow for nutrition care plan  NUTRITION DIAGNOSIS: Inadequate oral intake related to inability to eat as evidenced by NPO status  Goal: Initiate EN support within next 24-48 hours if prolonged intubation expected  Monitor:  EN initiation, respiratory status, weight, labs, I/O's  Reason for Assessment: VDRF  52 y.o. male  Admitting Dx: Lower GI bleed  ASSESSMENT: Patient presented with an acute GI bleed; had a positive tagged RBC scan at [facility]; transferred here and bleeding resolved; colonoscopy  showed small polyp and some diverticuli at the splenic flexure; CXR on admission showed large R pleural effusion; OGT in place.  Patient is currently intubated on ventilator support MV: 10 Temp: 37.9  Patient s/p procedures 1/9: RIGHT VIDEO ASSISTED THORACOSCOPY   DRAINAGE OF PLEURAL EFFUSION  DECORTICATION RIGHT LUNG  Height: Ht Readings from Last 1 Encounters:  12/11/12 6\' 1"  (1.854 m)    Weight: Wt Readings from Last 1 Encounters:  12/17/12 221 lb 5.5 oz (100.4 kg)    Ideal Body Weight: 184 lb  % Ideal Body Weight: 120%  Wt Readings from Last 10 Encounters:  12/17/12 221 lb 5.5 oz (100.4 kg)  12/17/12 221 lb 5.5 oz (100.4 kg)  12/17/12 221 lb 5.5 oz (100.4 kg)  12/17/12 221 lb 5.5 oz (100.4 kg)    Usual Body Weight: unable to obtain  % Usual Body Weight: ---  BMI:  Body mass index is 29.20 kg/(m^2).  Estimated Nutritional Needs: Kcal: 2250-2350 Protein: 120-130 gm Fluid: 2.2-2.3 L  Skin: surgical chest incision   Diet Order: NPO  EDUCATION NEEDS: -No education needs  identified at this time   Intake/Output Summary (Last 24 hours) at 12/19/12 0936 Last data filed at 12/19/12 0900  Gross per 24 hour  Intake 2847.68 ml  Output   4470 ml  Net -1622.32 ml    Labs:   Lab 12/19/12 0415 12/18/12 2026 12/18/12 1654 12/17/12 1846 12/16/12 1039 12/15/12 1258 12/14/12 1241  NA 139 141 141 -- -- -- --  K 4.8 4.1 4.7 -- -- -- --  CL 101 -- -- 99 99 -- --  CO2 23 -- -- 31 28 -- --  BUN 35* -- -- 19 27* -- --  CREATININE 12.15* -- -- 8.41* 11.98* -- --  CALCIUM 8.3* -- -- 9.0 8.9 -- --  MG -- -- -- -- -- -- --  PHOS -- -- -- -- 6.1* 6.8* 6.9*  GLUCOSE 101* 118* -- 107* -- -- --    Scheduled Meds:   . acetaminophen  1,000 mg Intravenous Q6H  . bisacodyl  10 mg Oral Daily  . calcium acetate  2,001 mg Oral BID BM  . calcium acetate  2,668 mg Oral TID WC  . cinacalcet  180 mg Oral Q supper  . darbepoetin (ARANESP) injection - DIALYSIS  60 mcg Intravenous Q Fri-HD  . doxercalciferol  4.5 mcg Intravenous Q M,W,F-HD  . fentaNYL   Intravenous Q4H  . multivitamin  1 tablet Oral QHS  . ondansetron (ZOFRAN) IV  4 mg Intravenous Q6H  Continuous Infusions:   . dextrose 5 % and 0.45% NaCl 10 mL/hr at 12/19/12 0053    Past Medical History  Diagnosis Date  . End-stage renal disease on hemodialysis   . Secondary hyperparathyroidism   . Hypertension   . Polysubstance abuse   . Anemia of chronic disease     BL Hgb ~10  . Tobacco abuse     Past Surgical History  Procedure Date  . Surgery for horseshoe kidney   . Colonoscopy 12/16/2012    Procedure: COLONOSCOPY;  Surgeon: Milus Banister, MD;  Location: [place];  Service: Endoscopy;  Laterality: N/A;    Arthur Holms, RD, LDN Pager #: [phone] After-Hours Pager #: [phone]

## 2012-12-19 NOTE — Significant Event (Signed)
1015am-received verbal orders from MD [facility] to extubate. Pt extubated at 1026am by RRT, to 4L [place], VS stable, O2 saturation 100%. Pt able to vocalized after extubation. Will monitor. Girtie Wiersma, Therapist, sports.

## 2012-12-19 NOTE — Progress Notes (Signed)
[facility]  Subjective:  Intubated, tearful. Afebrile. Had VATS yesterday without complications.    Objective: Vital signs in last 24 hours: Blood pressure 124/88, pulse 109, temperature 100.3 F (37.9 C), temperature source Oral, resp. rate 28, height 6\' 1"  (1.854 m), weight 221 lb 5.5 oz (100.4 kg), SpO2 100.00%.    PHYSICAL EXAM  Gen: Intubated. Anxious.  Neck: no JVD  Chest: Diminish breath sound on R. No rales.  Cardio: regular rate and rhythm, S1, S2 normal, no murmur.  GI: soft, non-tender; bowel sounds normal; no masses, no organomegaly  Extremities: extremities normal, atraumatic, no cyanosis or edema. R arm graft Pulses: 2+ and symmetric  Neurologic: No focal deficit  Lab Results:   Lab 12/19/12 0415 12/18/12 2026 12/18/12 1654 12/17/12 1846 12/16/12 1039  NA 139 141 141 -- --  K 4.8 4.1 4.7 -- --  CL 101 -- -- 99 99  CO2 23 -- -- 31 28  BUN 35* -- -- 19 27*  CREATININE 12.15* -- -- 8.41* 11.98*  ALB -- -- -- -- --  GLUCOSE 101* -- -- -- --  CALCIUM 8.3* -- -- 9.0 8.9  PHOS -- -- -- -- 6.1*     Basename 12/19/12 0415 12/18/12 2026 12/18/12 2025  WBC 6.4 -- 6.5  HGB 8.3* 8.5* --  HCT 24.9* 25.0* --  PLT 143* -- 128*     Scheduled Meds:   . acetaminophen  1,000 mg Intravenous Q6H  . bisacodyl  10 mg Oral Daily  . calcium acetate  2,001 mg Oral BID BM  . calcium acetate  2,668 mg Oral TID WC  . cinacalcet  180 mg Oral Q supper  . darbepoetin (ARANESP) injection - DIALYSIS  60 mcg Intravenous Q Fri-HD  . doxercalciferol  4.5 mcg Intravenous Q M,W,F-HD  . fentaNYL   Intravenous Q4H  . multivitamin  1 tablet Oral QHS  . ondansetron (ZOFRAN) IV  4 mg Intravenous Q6H   Continuous Infusions:   . dextrose 5 % and 0.45% NaCl 10 mL/hr at 12/19/12 0053   PRN Meds:.sodium chloride, sodium chloride, diphenhydrAMINE, diphenhydrAMINE, feeding supplement (NEPRO CARB STEADY), heparin, hydrALAZINE, HYDROmorphone (DILAUDID) injection, lidocaine,  lidocaine-prilocaine, morphine injection, naloxone, ondansetron (ZOFRAN) IV, oxyCODONE, oxyCODONE-acetaminophen, pentafluoroprop-tetrafluoroeth, potassium chloride, promethazine, promethazine, senna-docusate, sodium chloride  Assessment/Plan:  1. ESRD: Cr 12.15. K 4.8. Pt on scheduled HD MWF - Hemodialysis today.   2. 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 PTH 583.2 on 12/11/12.  3. Lower GI bleed: Hb. s/p transfusion 3 units total. Colonoscopy showed non bleeding diverticula and sessile polyp that was biopsied. 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 Right Pleural effusion: s/p Thoracocentesis. VATS yesterday. Possible extubation today-- F/u by Pulmonology and CT surgery.  5. HTN: controlled.  Hydralazine PRN.   LOS: 8 days   PILOTO, DAYARMYS 12/19/2012,10:40 AM

## 2012-12-19 NOTE — Procedures (Signed)
Extubation Procedure Note  Patient Details:   Name: [name] [name] DOB: 07/08/1961 MRN: [id number]   Airway Documentation:  Airway 13 mm (Active)     Airway 8.5 mm (Active)  Secured at (cm) 28 cm 12/19/2012  7:41 AM  Measured From Lips 12/19/2012  7:41 AM  [place] 12/19/2012  7:41 AM  Secured By [employer] 12/19/2012  7:41 AM  Tube Holder Repositioned Yes 12/19/2012  7:41 AM    Evaluation  O2 sats: stable throughout Complications: No apparent complications Patient did tolerate procedure well. Bilateral Breath Sounds: Rhonchi Suctioning: Airway Yes Extubated to 4L [place] and tolerated well.    Beatris Si D 12/19/2012, 10:31 AM

## 2012-12-20 ENCOUNTER — Inpatient Hospital Stay (HOSPITAL_COMMUNITY): Payer: Medicare Other

## 2012-12-20 LAB — CBC
Platelets: 172 10*3/uL (ref 150–400)
RDW: 16.7 % — ABNORMAL HIGH (ref 11.5–15.5)
WBC: 9.2 10*3/uL (ref 4.0–10.5)

## 2012-12-20 LAB — COMPREHENSIVE METABOLIC PANEL
AST: 12 U/L (ref 0–37)
Albumin: 2.6 g/dL — ABNORMAL LOW (ref 3.5–5.2)
Alkaline Phosphatase: 60 U/L (ref 39–117)
Chloride: 98 mEq/L (ref 96–112)
Creatinine, Ser: 9.05 mg/dL — ABNORMAL HIGH (ref 0.50–1.35)
Potassium: 4.3 mEq/L (ref 3.5–5.1)
Total Bilirubin: 0.3 mg/dL (ref 0.3–1.2)

## 2012-12-20 NOTE — Progress Notes (Signed)
Notified Dr.Owen of pt's HR 120s, pt's pain is resolved, pt also c/o nausea, nausea has resolved, pt is asleep, breathing adequately, no new orders received, will cont to monitor.

## 2012-12-20 NOTE — Progress Notes (Signed)
[facility]  Subjective:  S/p extubation. Awake, alert sitting up in chair. Appears uncomfortable. BP noted 93/58.  Feels better than yesterday. Wants to know when he can go to a regular room.    Objective: Vital signs in last 24 hours: Blood pressure 93/58, pulse 62, temperature 98.2 F (36.8 C), temperature source Oral, resp. rate 17, height 6\' 1"  (1.854 m), weight 224 lb 3.3 oz (101.7 kg), SpO2 97.00%.    PHYSICAL EXAM  Gen:as above.  Neck: no JVD  Chest: Diminish breath sound on R. No rales.  Cardio: regular rate and rhythm, S1, S2 normal, no murmur.  GI: soft, non-tender Extremities:  R arm graft patent, trace edema Pulses: 2+ and symmetric  Neurologic: No focal deficit, grossly normal  Lab Results:  CMP     Component Value Date/Time   NA 140 12/20/2012 0416   K 4.3 12/20/2012 0416   CL 98 12/20/2012 0416   CO2 29 12/20/2012 0416   GLUCOSE 142* 12/20/2012 0416   BUN 24* 12/20/2012 0416   CREATININE 9.05* 12/20/2012 0416   CALCIUM 8.3* 12/20/2012 0416   PROT 6.6 12/20/2012 0416   ALBUMIN 2.6* 12/20/2012 0416   AST 12 12/20/2012 0416   ALT 6 12/20/2012 0416   ALKPHOS 60 12/20/2012 0416   BILITOT 0.3 12/20/2012 0416   GFRNONAA 6* 12/20/2012 0416   GFRAA 7* 12/20/2012 0416     Lab 12/19/12 0415 12/18/12 2026 12/18/12 1654 12/17/12 1846 12/16/12 1039  NA 139 141 141 -- --  K 4.8 4.1 4.7 -- --  CL 101 -- -- 99 99  CO2 23 -- -- 31 28  BUN 35* -- -- 19 27*  CREATININE 12.15* -- -- 8.41* 11.98*  ALB -- -- -- -- --  GLUCOSE 101* -- -- -- --  CALCIUM 8.3* -- -- 9.0 8.9  PHOS -- -- -- -- 6.1*     Basename 12/20/12 0416 12/19/12 0415  WBC 9.2 6.4  HGB 9.0* 8.3*  HCT 28.7* 24.9*  PLT 172 143*     Scheduled Meds:    . bisacodyl  10 mg Oral Daily  . calcium acetate  2,668 mg Oral TID WC  . cinacalcet  180 mg Oral Q supper  . darbepoetin (ARANESP) injection - DIALYSIS  60 mcg Intravenous Q Fri-HD  . doxercalciferol  4.5 mcg Intravenous Q M,W,F-HD  .  fentaNYL   Intravenous Q4H  . multivitamin  1 tablet Oral QHS  . ondansetron (ZOFRAN) IV  4 mg Intravenous Q6H   Continuous Infusions:    . dextrose 5 % and 0.45% NaCl 10 mL/hr at 12/19/12 1200   PRN Meds:.sodium chloride, sodium chloride, calcium acetate, diphenhydrAMINE, diphenhydrAMINE, feeding supplement (NEPRO CARB STEADY), heparin, hydrALAZINE, HYDROmorphone (DILAUDID) injection, lidocaine, lidocaine-prilocaine, morphine injection, naloxone, ondansetron (ZOFRAN) IV, oxyCODONE-acetaminophen, pentafluoroprop-tetrafluoroeth, potassium chloride, promethazine, promethazine, senna-docusate, sodium chloride  Assessment/Plan:  1. ESRD: Cr 9.05. K 4.3. Pt on scheduled HD MWF - Hemodialysis planned for Monday  2. 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 PTH 583.2 on 12/11/12.  3. Lower GI bleed: Hb. s/p transfusion 3 units total. Colonoscopy showed non bleeding diverticula and sessile polyp that was biopsied. 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 Right Pleural effusion: s/p Thoracocentesis. VATS yesterday. Possible extubation today-- F/u by Pulmonology and CT surgery.  5. HTN: slightly hypotensive-may need small bolus-per primary team.  Hydralazine PRN when hypertensive.    LOS: 9 days   Brayton Mars. Melanee Spry, MD, PGY2 12/20/2012 8:53  AM

## 2012-12-20 NOTE — Progress Notes (Signed)
I have seen and examined this patient and agree with the plan of care .  [name] [name] 12/20/2012, 12:12 PM

## 2012-12-20 NOTE — Progress Notes (Signed)
   CARDIOTHORACIC SURGERY PROGRESS NOTE  2 Days Post-Op  S/P Procedure(s) (LRB): VIDEO ASSISTED THORACOSCOPY (Right) DRAINAGE OF PLEURAL EFFUSION (Right)  Subjective: Feels okay.  Mild soreness in chest.  Breathing comfortably.  Objective: Vital signs in last 24 hours: Temp:  [98.2 F (36.8 C)-99.8 F (37.7 C)] 98.2 F (36.8 C) (01/11 0733) Pulse Rate:  [51-135] 80  (01/11 0900) Cardiac Rhythm:  [-] Sinus tachycardia (01/11 0800) Resp:  [6-25] 21  (01/11 1000) BP: (85-119)/(49-84) 100/58 mmHg (01/11 1000) SpO2:  [90 %-100 %] 99 % (01/11 0900) Arterial Line BP: (82-137)/(45-79) 121/58 mmHg (01/11 1000) Weight:  [101.7 kg (224 lb 3.3 oz)-102.9 kg (226 lb 13.7 oz)] 101.7 kg (224 lb 3.3 oz) (01/11 0500)  Physical Exam:  Rhythm:   sinus  Breath sounds: Few wheezes on right, some subQ air on right  Heart sounds:  RRR  Incisions:  Dressings intact  Abdomen:  soft  Extremities:  warm  Chest tube(s):  + continuous air leak, serosanguinous output   Intake/Output from previous day: 01/10 0701 - 01/11 0700 In: 1588.1 [P.O.:960; I.V.:340.1; NG/GT:30; IV Piggyback:258] Out: 570 [Chest Tube:515] Intake/Output this shift: Total I/O In: 314.5 [P.O.:240; I.V.:54.5; Other:20] Out: -   Lab Results:  Basename 12/20/12 0416 12/19/12 0415  WBC 9.2 6.4  HGB 9.0* 8.3*  HCT 28.7* 24.9*  PLT 172 143*   BMET:  Basename 12/20/12 0416 12/19/12 0415  NA 140 139  K 4.3 4.8  CL 98 101  CO2 29 23  GLUCOSE 142* 101*  BUN 24* 35*  CREATININE 9.05* 12.15*  CALCIUM 8.3* 8.3*    CBG (last 3)  No results found for this basename: GLUCAP:3 in the last 72 hours  CXR:  *RADIOLOGY REPORT*  Clinical Data: VATS. Chest tube.  PORTABLE CHEST - 1 VIEW  Comparison: 12/19/2012  Findings: Three right chest tubes remain in place. Stable right  apical pneumothorax. Volume loss on the right with patchy right  lung airspace disease, also stable. No confluent opacity on the  left.  Interval extubation  and removal of NG tube. Left central line is  unchanged.  IMPRESSION:  Interval extubation. Otherwise no significant change.  Original Report Authenticated By: Rolm Baptise, M.D.    Assessment/Plan: S/P Procedure(s) (LRB): VIDEO ASSISTED THORACOSCOPY (Right) DRAINAGE OF PLEURAL EFFUSION (Right)  Overall stable Now w/ fairly large air leak but CXR looks pretty good Keep chest tubes to suction Mobilize  [name] [name],[name] [name] 12/20/2012 11:16 AM

## 2012-12-20 NOTE — Progress Notes (Signed)
[facility] BRIEF SICU PROGRESS NOTE  2 Days Post-Op  S/P Procedure(s) (LRB): VIDEO ASSISTED THORACOSCOPY (Right) DRAINAGE OF PLEURAL EFFUSION (Right)   Stable day  Plan: Continue current plan  [name] [name] H 12/20/2012 3:56 PM

## 2012-12-21 ENCOUNTER — Inpatient Hospital Stay (HOSPITAL_COMMUNITY): Payer: Medicare Other

## 2012-12-21 LAB — BODY FLUID CULTURE

## 2012-12-21 MED ORDER — METOPROLOL TARTRATE 25 MG PO TABS
25.0000 mg | ORAL_TABLET | Freq: Two times a day (BID) | ORAL | Status: DC
  Administered 2012-12-21 – 2012-12-30 (×16): 25 mg via ORAL
  Filled 2012-12-21 (×20): qty 1

## 2012-12-21 NOTE — Progress Notes (Signed)
   CARDIOTHORACIC SURGERY PROGRESS NOTE  3 Days Post-Op  S/P Procedure(s) (LRB): VIDEO ASSISTED THORACOSCOPY (Right) DRAINAGE OF PLEURAL EFFUSION (Right)  Subjective: Had some increased pain last night but doing better today.  Somewhat sleepy at present, received Percocet recently.  Objective: Vital signs in last 24 hours: Temp:  [98 F (36.7 C)-99.9 F (37.7 C)] 99.9 F (37.7 C) (01/12 1138) Pulse Rate:  [58-129] 106  (01/12 1100) Cardiac Rhythm:  [-] Sinus tachycardia (01/12 0600) Resp:  [6-27] 13  (01/12 1100) BP: (94-123)/(61-106) 117/70 mmHg (01/12 1100) SpO2:  [94 %-100 %] 98 % (01/12 1100) Weight:  [102 kg (224 lb 13.9 oz)] 102 kg (224 lb 13.9 oz) (01/12 0500)  Physical Exam:  Rhythm:   Sinus tach  Breath sounds: Fairly clear but somewhat diminished on right, subQ air on right stable  Heart sounds:  tachy  Incisions:  Dressings dry, intact  Abdomen:  soft  Extremities:  warm  Chest tube(s):  + air leak, low volume serosanguinous output   Intake/Output from previous day: 01/11 0701 - 01/12 0700 In: 1172.3 [P.O.:600; I.V.:544.3; IV Piggyback:8] Out: 320 [Chest Tube:320] Intake/Output this shift: Total I/O In: 306 [P.O.:240; I.V.:66] Out: 50 [Chest Tube:50]  Lab Results:  [facility] 12/20/12 0416 12/19/12 0415  WBC 9.2 6.4  HGB 9.0* 8.3*  HCT 28.7* 24.9*  PLT 172 143*   BMET:  Basename 12/20/12 0416 12/19/12 0415  NA 140 139  K 4.3 4.8  CL 98 101  CO2 29 23  GLUCOSE 142* 101*  BUN 24* 35*  CREATININE 9.05* 12.15*  CALCIUM 8.3* 8.3*    CBG (last 3)  No results found for this basename: GLUCAP:3 in the last 72 hours  CXR:  *RADIOLOGY REPORT*  Clinical Data: Right VATS, chest tubes  PORTABLE CHEST - 1 VIEW  Comparison: 12/20/2012  Findings: Three right chest tubes remain. Although not as well  demonstrated because of positioning and technique, the small  residual right apical pneumothorax appears to be grossly stable.  Pleural density persist on  the right compatible with residual  pleural fluid versus pleural thickening. Stable volume loss and  airspace disease/atelectasis in the right lung. Left lung remains  clear. Stable cardiac enlargement but normal vascularity.  IMPRESSION:  Stable postoperative appearance  Original Report Authenticated By: Jerilynn Mages. Annamaria Boots, M.D.   Assessment/Plan: S/P Procedure(s) (LRB): VIDEO ASSISTED THORACOSCOPY (Right) DRAINAGE OF PLEURAL EFFUSION (Right)  Overall stable Fairly large air leak persists CXR stable Sinus tach Pain under somewhat better control   Mobilize  pulm toilet  Leave chest tubes on suction today  Add beta blocker  ESRD and other medical issues per nephrology team  [facility] H 12/21/2012 12:09 PM

## 2012-12-21 NOTE — Progress Notes (Signed)
[facility]  Subjective:  In bed receiving HD at this time. Feeling better. Afebrile   Objective: Vital signs in last 24 hours: Blood pressure 112/66, pulse 81, temperature 98.1 F (36.7 C), temperature source Oral, resp. rate 20, height 6\' 1"  (1.854 m), weight 226 lb 13.7 oz (102.9 kg), SpO2 97.00%.  PHYSICAL EXAM  GEN:NAD Neck: no JVD  Chest: Diminish breath sound on R. No rales.  Cardio: regular rate and rhythm, S1, S2 normal, no murmur.  GI: soft, non-tender  Extremities: R arm graft patent, trace edema  Pulses: 2+ and symmetric  Neurologic: No focal deficit, grossly normal  Lab Results:   Lab 12/22/12 0340 12/20/12 0416 12/19/12 0415 12/16/12 1039  NA 135 140 139 --  K 5.1 4.3 4.8 --  CL 95* 98 101 --  CO2 27 29 23  --  BUN 48* 24* 35* --  CREATININE 15.26* 9.05* 12.15* --  ALB -- -- -- --  GLUCOSE 117* -- -- --  CALCIUM 8.9 8.3* 8.3* --  PHOS -- -- -- 6.1*     Basename 12/22/12 0340 12/20/12 0416  WBC 8.6 9.2  HGB 8.1* 9.0*  HCT 26.3* 28.7*  PLT 180 172     Scheduled Meds:    . bisacodyl  10 mg Oral Daily  . calcium acetate  2,668 mg Oral TID WC  . cinacalcet  180 mg Oral Q supper  . darbepoetin (ARANESP) injection - DIALYSIS  60 mcg Intravenous Q Fri-HD  . doxercalciferol      . doxercalciferol  4.5 mcg Intravenous Q M,W,F-HD  . enoxaparin  30 mg Subcutaneous Q24H  . fentaNYL   Intravenous Q4H  . metoprolol tartrate  25 mg Oral BID  . multivitamin  1 tablet Oral QHS  . ondansetron (ZOFRAN) IV  4 mg Intravenous Q6H   Continuous Infusions:   . dextrose 5 % and 0.45% NaCl 20 mL/hr at 12/21/12 1327   PRN Meds:.sodium chloride, sodium chloride, alteplase, calcium acetate, diphenhydrAMINE, diphenhydrAMINE, feeding supplement (NEPRO CARB STEADY), heparin, hydrALAZINE, lidocaine, lidocaine-prilocaine, naloxone, ondansetron (ZOFRAN) IV, oxyCODONE-acetaminophen, pentafluoroprop-tetrafluoroeth, potassium chloride, promethazine, promethazine,  senna-docusate, sodium chloride  Assessment/Plan:  1. ESRD: Cr 15.26 . K+ 5.1. Normal bicarb.  Pt on scheduled HD MWF  - Pt receiving HD now. Next scheduled for Weds. Pt will not receive heparin w/ HD  2. 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 PTH 583.2 on 12/11/12.   3. HTN: Mildly low. Continue to monitor. Continue holding BP meds  4. Lower GI bleed: Hb 8.1  s/p transfusion 3 units total. Colonoscopy showed non bleeding diverticula and sessile polyp that was biopsied. 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 Right Pleural effusion: s/p Thoracocentesis and VATS. Extubated and doing well.- F/u by Pulmonology and CT surgery.     LOS: 11 days    D. [place] Philippa Sicks, MD Family Medicine  PGY-2

## 2012-12-21 NOTE — Progress Notes (Signed)
I have seen and examined this patient and agree with the plan of care .  [name] [name] W 12/21/2012, 3:30 PM

## 2012-12-21 NOTE — Progress Notes (Signed)
[name]  Subjective:  Transferring from bed to seat. Extubated since Friday. Doing better. Afebrile.   Objective: Vital signs in last 24 hours: Blood pressure 113/65, pulse 107, temperature 99.6 F (37.6 C), temperature source Oral, resp. rate 22, height 6\' 1"  (1.854 m), weight 224 lb 13.9 oz (102 kg), SpO2 100.00%.  PHYSICAL EXAM  Gen:as above.  Neck: no JVD  Chest: Diminish breath sound on R. No rales.  Cardio: regular rate and rhythm, S1, S2 normal, no murmur.  GI: soft, non-tender  Extremities: R arm graft patent, trace edema  Pulses: 2+ and symmetric  Neurologic: No focal deficit, grossly normal  Lab Results:  Lab 12/20/12 0416 12/19/12 0415 12/18/12 2026 12/17/12 1846  NA 140 139 141 --  K 4.3 4.8 4.1 --  CL 98 101 -- 99  CO2 29 23 -- 31  BUN 24* 35* -- 19  CREATININE 9.05* 12.15* -- 8.41*  ALB -- -- -- --  GLUCOSE 142* -- -- --  CALCIUM 8.3* 8.3* -- 9.0  PHOS -- -- -- --     Basename 12/20/12 0416 12/19/12 0415  WBC 9.2 6.4  HGB 9.0* 8.3*  HCT 28.7* 24.9*  PLT 172 143*     Scheduled Meds:   . bisacodyl  10 mg Oral Daily  . calcium acetate  2,668 mg Oral TID WC  . cinacalcet  180 mg Oral Q supper  . darbepoetin (ARANESP) injection - DIALYSIS  60 mcg Intravenous Q Fri-HD  . doxercalciferol  4.5 mcg Intravenous Q M,W,F-HD  . fentaNYL   Intravenous Q4H  . multivitamin  1 tablet Oral QHS  . ondansetron (ZOFRAN) IV  4 mg Intravenous Q6H   Continuous Infusions:   . dextrose 5 % and 0.45% NaCl 20 mL/hr at 12/21/12 0400   PRN Meds:.sodium chloride, sodium chloride, calcium acetate, diphenhydrAMINE, diphenhydrAMINE, feeding supplement (NEPRO CARB STEADY), heparin, hydrALAZINE, HYDROmorphone (DILAUDID) injection, lidocaine, lidocaine-prilocaine, morphine injection, naloxone, ondansetron (ZOFRAN) IV, oxyCODONE-acetaminophen, pentafluoroprop-tetrafluoroeth, potassium chloride, promethazine, promethazine, senna-docusate, sodium  chloride  Assessment/Plan:  1. ESRD: Cr 9.05. K 4.3 yesterday pending renal panel today. Pt on scheduled HD MWF  - Hemodialysis planned for Monday   2. 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 PTH 583.2 on 12/11/12.  3. Lower GI bleed: Hb. s/p transfusion 3 units total. Colonoscopy showed non bleeding diverticula and sessile polyp that was biopsied. 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 Right Pleural effusion: s/p Thoracocentesis and VATS. Extubated and doing well.- F/u by Pulmonology and CT surgery.  5. HTN: on the lower side. Continue to monitor.      LOS: 10 days   [name] [name], [name] [name] 12/21/2012,8:59 AM

## 2012-12-22 ENCOUNTER — Inpatient Hospital Stay (HOSPITAL_COMMUNITY): Payer: Medicare Other

## 2012-12-22 LAB — POCT I-STAT 3, ART BLOOD GAS (G3+)
Acid-Base Excess: 6 mmol/L — ABNORMAL HIGH (ref 0.0–2.0)
O2 Saturation: 100 %
Patient temperature: 100.3
pO2, Arterial: 139 mmHg — ABNORMAL HIGH (ref 80.0–100.0)

## 2012-12-22 LAB — CBC
HCT: 26.3 % — ABNORMAL LOW (ref 39.0–52.0)
MCH: 28.8 pg (ref 26.0–34.0)
MCV: 93.6 fL (ref 78.0–100.0)
Platelets: 180 10*3/uL (ref 150–400)
RBC: 2.81 MIL/uL — ABNORMAL LOW (ref 4.22–5.81)

## 2012-12-22 LAB — BASIC METABOLIC PANEL
BUN: 48 mg/dL — ABNORMAL HIGH (ref 6–23)
CO2: 27 mEq/L (ref 19–32)
Calcium: 8.9 mg/dL (ref 8.4–10.5)
Creatinine, Ser: 15.26 mg/dL — ABNORMAL HIGH (ref 0.50–1.35)

## 2012-12-22 MED ORDER — ENOXAPARIN SODIUM 30 MG/0.3ML ~~LOC~~ SOLN
30.0000 mg | SUBCUTANEOUS | Status: DC
  Administered 2012-12-22 – 2012-12-29 (×8): 30 mg via SUBCUTANEOUS
  Filled 2012-12-22 (×9): qty 0.3

## 2012-12-22 MED ORDER — LIDOCAINE-PRILOCAINE 2.5-2.5 % EX CREA
1.0000 "application " | TOPICAL_CREAM | CUTANEOUS | Status: DC | PRN
  Filled 2012-12-22: qty 5

## 2012-12-22 MED ORDER — DOXERCALCIFEROL 4 MCG/2ML IV SOLN
INTRAVENOUS | Status: AC
  Administered 2012-12-22: 4.5 ug via INTRAVENOUS
  Filled 2012-12-22: qty 4

## 2012-12-22 MED ORDER — PENTAFLUOROPROP-TETRAFLUOROETH EX AERO: 1.0000 "application " | INHALATION_SPRAY | CUTANEOUS | Status: DC | PRN

## 2012-12-22 MED ORDER — PRO-STAT SUGAR FREE PO LIQD
30.0000 mL | Freq: Three times a day (TID) | ORAL | Status: DC
  Filled 2012-12-22 (×11): qty 30

## 2012-12-22 MED ORDER — ALTEPLASE 2 MG IJ SOLR
2.0000 mg | Freq: Once | INTRAMUSCULAR | Status: AC | PRN
  Filled 2012-12-22: qty 2

## 2012-12-22 MED ORDER — NEPRO/CARBSTEADY PO LIQD
237.0000 mL | ORAL | Status: DC | PRN
  Filled 2012-12-22: qty 237

## 2012-12-22 MED ORDER — SODIUM CHLORIDE 0.9 % IV SOLN: 100.0000 mL | INTRAVENOUS | Status: DC | PRN

## 2012-12-22 MED ORDER — LIDOCAINE HCL (PF) 1 % IJ SOLN: 5.0000 mL | INTRAMUSCULAR | Status: DC | PRN

## 2012-12-22 MED ORDER — HEPARIN SODIUM (PORCINE) 1000 UNIT/ML DIALYSIS
1000.0000 [IU] | INTRAMUSCULAR | Status: DC | PRN
  Filled 2012-12-22: qty 1

## 2012-12-22 NOTE — Progress Notes (Addendum)
NUTRITION FOLLOW UP  Intervention:    Recommend Renal 80/90-01-11-1199 ml diet  Add Prostat liquid protein 3 times daily with meals (100 kcals, 15 gm protein per dose) RD to follow for nutrition care plan  New Nutrition Dx:   Increased nutrient needs related to ESRD, hemodialysis as evidenced by estimated nutrition needs  New Goal:   Oral intake with meals & supplements to meet >/= 90% of estimated nutrition needs  Monitor:   PO & supplemental intake, weight, labs, I/O's  Assessment:   Patient extubated 1/10.  PO intake 100% per flowsheet records.  Receiving hemodialysis.  Nepro Carb Steady supplement in place as needed.  Would benefit from additional protein -- RD to order.  S/p procedures 1/9:  RIGHT VIDEO ASSISTED THORACOSCOPY  DRAINAGE OF PLEURAL EFFUSION DECORTICATION RIGHT LUNG  Height: Ht Readings from Last 1 Encounters:  12/11/12 6\' 1"  (1.854 m)    Weight Status:   Wt Readings from Last 1 Encounters:  12/22/12 224 lb 10.4 oz (101.9 kg)    Re-estimated needs:  Kcal: 2300-2500 Protein: 120-130 gm Fluid: 2.2-2.3 L  Skin: surgical chest incision   Diet Order: Renal 60/70-01-11-1199 ml   Intake/Output Summary (Last 24 hours) at 12/22/12 1444 Last data filed at 12/22/12 1218  Gross per 24 hour  Intake 1075.6 ml  Output   2410 ml  Net -1334.4 ml    Labs:   Lab 12/22/12 0340 12/20/12 0416 12/19/12 0415 12/16/12 1039  NA 135 140 139 --  K 5.1 4.3 4.8 --  CL 95* 98 101 --  CO2 27 29 23  --  BUN 48* 24* 35* --  CREATININE 15.26* 9.05* 12.15* --  CALCIUM 8.9 8.3* 8.3* --  MG -- -- -- --  PHOS -- -- -- 6.1*  GLUCOSE 117* 142* 101* --    Scheduled Meds:   . bisacodyl  10 mg Oral Daily  . calcium acetate  2,668 mg Oral TID WC  . cinacalcet  180 mg Oral Q supper  . darbepoetin (ARANESP) injection - DIALYSIS  60 mcg Intravenous Q Fri-HD  . doxercalciferol  4.5 mcg Intravenous Q M,W,F-HD  . enoxaparin  30 mg Subcutaneous Q24H  . fentaNYL   Intravenous  Q4H  . metoprolol tartrate  25 mg Oral BID  . multivitamin  1 tablet Oral QHS  . ondansetron (ZOFRAN) IV  4 mg Intravenous Q6H    Continuous Infusions:   . dextrose 5 % and 0.45% NaCl 20 mL/hr at 12/22/12 0400    Arthur Holms, RD, LDN Pager #: [phone] After-Hours Pager #: [phone]

## 2012-12-22 NOTE — Progress Notes (Signed)
4 Days Post-Op Procedure(s) (LRB): VIDEO ASSISTED THORACOSCOPY (Right) DRAINAGE OF PLEURAL EFFUSION (Right) Subjective: C/o pain On HD currently  Objective: Vital signs in last 24 hours: Temp:  [98.9 F (37.2 C)-99.9 F (37.7 C)] 99.1 F (37.3 C) (01/13 0400) Pulse Rate:  [58-121] 74  (01/13 0730) Cardiac Rhythm:  [-] Normal sinus rhythm (01/13 0700) Resp:  [0-25] 12  (01/13 0730) BP: (97-132)/(56-100) 101/56 mmHg (01/13 0730) SpO2:  [91 %-100 %] 97 % (01/13 0730) Weight:  [225 lb 12 oz (102.4 kg)-226 lb 13.7 oz (102.9 kg)] 226 lb 13.7 oz (102.9 kg) (01/13 0723)  Hemodynamic parameters for last 24 hours:    Intake/Output from previous day: 01/12 0701 - 01/13 0700 In: 1384.5 [P.O.:840; I.V.:540.5; IV Piggyback:4] Out: 520 [Urine:50; Chest Tube:470] Intake/Output this shift:    General appearance: alert and no distress Heart: regular rate and rhythm Lungs: diminished breath sounds right side  Lab Results:  Basename 12/22/12 0340 12/20/12 0416  WBC 8.6 9.2  HGB 8.1* 9.0*  HCT 26.3* 28.7*  PLT 180 172   BMET:  Basename 12/22/12 0340 12/20/12 0416  NA 135 140  K 5.1 4.3  CL 95* 98  CO2 27 29  GLUCOSE 117* 142*  BUN 48* 24*  CREATININE 15.26* 9.05*  CALCIUM 8.9 8.3*    PT/INR: No results found for this basename: LABPROT,INR in the last 72 hours ABG    Component Value Date/Time   PHART 7.566* 12/19/2012 0535   HCO3 26.9* 12/19/2012 0535   TCO2 28 12/19/2012 0535   O2SAT 100.0 12/19/2012 0535   CBG (last 3)  No results found for this basename: GLUCAP:3 in the last 72 hours  Assessment/Plan: S/P Procedure(s) (LRB): VIDEO ASSISTED THORACOSCOPY (Right) DRAINAGE OF PLEURAL EFFUSION (Right) Plan for transfer to step-down: see transfer orders RESP- s/p decortication, CXR still shows some atelectasis in right lung as well as a small apical space Will add flutter valve Moderate air leak- will try CT on water seal Ambulate To 3300 after dialysis   LOS: 11 days      [name] [name] C 12/22/2012

## 2012-12-22 NOTE — Progress Notes (Signed)
[name] [name]  Subjective:  Awaiting anxious for transfer to step down unit. Feeling better.  Objective: Vital signs in last 24 hours: Blood pressure 115/65, pulse 78, temperature 98.4 F (36.9 C), temperature source Oral, resp. rate 12, height 6\' 1"  (1.854 m), weight 223 lb 15.8 oz (101.6 kg), SpO2 100.00%.  PHYSICAL EXAM  GEN:NAD  Neck: no JVD  Chest: Diminish breath sound on R. No rales.  Cardio: regular rate and rhythm, S1, S2 normal, no murmur.  GI: soft, non-tender  Extremities: R arm graft patent, trace edema  Pulses: 2+ and symmetric  Neurologic: No focal deficit, grossly normal  Lab Results:   Lab 12/22/12 0340 12/20/12 0416 12/19/12 0415 12/16/12 1039  NA 135 140 139 --  K 5.1 4.3 4.8 --  CL 95* 98 101 --  CO2 27 29 23  --  BUN 48* 24* 35* --  CREATININE 15.26* 9.05* 12.15* --  ALB -- -- -- --  GLUCOSE 117* -- -- --  CALCIUM 8.9 8.3* 8.3* --  PHOS -- -- -- 6.1*     Basename 12/22/12 0340  WBC 8.6  HGB 8.1*  HCT 26.3*  PLT 180     Scheduled Meds:    . bisacodyl  10 mg Oral Daily  . calcium acetate  2,668 mg Oral TID WC  . cinacalcet  180 mg Oral Q supper  . darbepoetin (ARANESP) injection - DIALYSIS  60 mcg Intravenous Q Fri-HD  . doxercalciferol  4.5 mcg Intravenous Q M,W,F-HD  . enoxaparin  30 mg Subcutaneous Q24H  . feeding supplement  30 mL Oral TID WC  . fentaNYL   Intravenous Q4H  . metoprolol tartrate  25 mg Oral BID  . multivitamin  1 tablet Oral QHS  . ondansetron (ZOFRAN) IV  4 mg Intravenous Q6H   Continuous Infusions:   . dextrose 5 % and 0.45% NaCl 20 mL/hr at 12/22/12 0400   PRN Meds:.sodium chloride, sodium chloride, calcium acetate, diphenhydrAMINE, diphenhydrAMINE, feeding supplement (NEPRO CARB STEADY), heparin, hydrALAZINE, lidocaine, lidocaine-prilocaine, naloxone, ondansetron (ZOFRAN) IV, oxyCODONE-acetaminophen, pentafluoroprop-tetrafluoroeth, potassium chloride, promethazine, senna-docusate, sodium  chloride  Assessment/Plan:   1. ESRD: Cr 15.26  K+ 5.2  Normal bicarb. Pt on scheduled HD MWF  -  Next HD scheduled for Weds. Pt will not receive heparin w/ HD   2. 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 PTH 583.2 on 12/11/12.   3. HTN: Controlled. On metoprolol   4. Lower GI bleed: Hb 8.1 yesterday s/p transfusion 3 units total. Colonoscopy showed non bleeding diverticula and sessile polyp that was biopsied. 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 Transfuse PRN (pt on aranesp)  5. Right Pleural effusion: s/p Thoracocentesis and VATS. Extubated and doing well possible transfer to step down today.- F/u by Pulmonology and CT surgery.     LOS: 12 days   D. Piloto Philippa Sicks, MD Family Medicine  PGY-2

## 2012-12-22 NOTE — Progress Notes (Signed)
Patient examined and record reviewed.Hemodynamics stable,labs satisfactory.Patient had stable day. Waiting for stepdown 3300 bed .Continue current care. VAN TRIGT III,Shriley Joffe 12/22/2012

## 2012-12-22 NOTE — Progress Notes (Signed)
I have personally seen and examined this patient and agree with the assessment/plan as outlined above by Piloto MD (PGY2). Ongoing ESRD care as he recovers s/p VATS.  Royston Bekele K.,MD 12/22/2012 9:09 AM

## 2012-12-22 NOTE — Procedures (Signed)
Patient seen on Hemodialysis. QB 400, UF goal 2.6L Treatment adjusted as needed.  Elmarie Shiley MD [facility]. Office # [phone] Pager # [phone] 9:11 AM

## 2012-12-23 ENCOUNTER — Inpatient Hospital Stay (HOSPITAL_COMMUNITY): Payer: Medicare Other

## 2012-12-23 MED ORDER — NEPRO/CARBSTEADY PO LIQD
237.0000 mL | ORAL | Status: DC | PRN
  Filled 2012-12-23: qty 237

## 2012-12-23 MED ORDER — SODIUM CHLORIDE 0.9 % IJ SOLN
10.0000 mL | Freq: Two times a day (BID) | INTRAMUSCULAR | Status: DC
  Administered 2012-12-23 – 2012-12-29 (×8): 10 mL via INTRAVENOUS

## 2012-12-23 MED ORDER — SODIUM CHLORIDE 0.9 % IV SOLN: 100.0000 mL | INTRAVENOUS | Status: DC | PRN

## 2012-12-23 MED ORDER — ALTEPLASE 2 MG IJ SOLR
2.0000 mg | Freq: Once | INTRAMUSCULAR | Status: AC | PRN
  Filled 2012-12-23: qty 2

## 2012-12-23 MED ORDER — HEPARIN SODIUM (PORCINE) 1000 UNIT/ML DIALYSIS
1000.0000 [IU] | INTRAMUSCULAR | Status: DC | PRN
  Filled 2012-12-23: qty 1

## 2012-12-23 MED ORDER — LIDOCAINE-PRILOCAINE 2.5-2.5 % EX CREA
1.0000 "application " | TOPICAL_CREAM | CUTANEOUS | Status: DC | PRN
  Filled 2012-12-23: qty 5

## 2012-12-23 MED ORDER — LIDOCAINE HCL (PF) 1 % IJ SOLN: 5.0000 mL | INTRAMUSCULAR | Status: DC | PRN

## 2012-12-23 MED ORDER — PENTAFLUOROPROP-TETRAFLUOROETH EX AERO: 1.0000 "application " | INHALATION_SPRAY | CUTANEOUS | Status: DC | PRN

## 2012-12-23 NOTE — Progress Notes (Signed)
[name]  Subjective:  Clinically better. Awaiting for bed availability to transfer.  Objective: Vital signs in last 24 hours: Blood pressure 123/66, pulse 79, temperature 97.9 F (36.6 C), temperature source Oral, resp. rate 15, height 6\' 1"  (1.854 m), weight 224 lb 10.4 oz (101.9 kg), SpO2 100.00%.   PHYSICAL EXAM  GEN:NAD  Neck: no JVD  Chest: Diminish breath sound on R. No rales.  Cardio: regular rate and rhythm, S1, S2 normal, no murmur.  GI: soft, non-tender  Extremities: R arm graft patent, trace edema  Pulses: 2+ and symmetric  Neurologic: No focal deficit, grossly normal  Lab Results:   Lab 12/24/12 0355 12/22/12 0340 12/20/12 0416  NA 135 135 140  K 4.4 5.1 4.3  CL 96 95* 98  CO2 28 27 29   BUN 40* 48* 24*  CREATININE 13.22* 15.26* 9.05*  ALB -- -- --  GLUCOSE 111* -- --  CALCIUM 8.9 8.9 8.3*  PHOS -- -- --     Basename 12/24/12 0355 12/22/12 0340  WBC 5.7 8.6  HGB 7.5* 8.1*  HCT 24.0* 26.3*  PLT 168 180     Scheduled Meds:    . bisacodyl  10 mg Oral Daily  . calcium acetate  2,668 mg Oral TID WC  . cinacalcet  180 mg Oral Q supper  . darbepoetin (ARANESP) injection - DIALYSIS  60 mcg Intravenous Q Fri-HD  . doxercalciferol  4.5 mcg Intravenous Q M,W,F-HD  . enoxaparin  30 mg Subcutaneous Q24H  . feeding supplement  30 mL Oral TID WC  . fentaNYL   Intravenous Q4H  . metoprolol tartrate  25 mg Oral BID  . multivitamin  1 tablet Oral QHS  . ondansetron (ZOFRAN) IV  4 mg Intravenous Q6H  . sodium chloride  10 mL Intravenous Q12H   Continuous Infusions:   . dextrose 5 % and 0.45% NaCl 20 mL/hr at 12/23/12 2000   PRN Meds:.sodium chloride, sodium chloride, sodium chloride, sodium chloride, calcium acetate, diphenhydrAMINE, diphenhydrAMINE, feeding supplement (NEPRO CARB STEADY), feeding supplement (NEPRO CARB STEADY), heparin, heparin, hydrALAZINE, lidocaine, lidocaine, lidocaine-prilocaine, lidocaine-prilocaine, naloxone,  ondansetron (ZOFRAN) IV, oxyCODONE-acetaminophen, pentafluoroprop-tetrafluoroeth, pentafluoroprop-tetrafluoroeth, potassium chloride, promethazine senna-docusate, sodium chloride  Assessment/Plan:  1. ESRD: Pt on scheduled HD MWF  - Next HD scheduled for today. Pt will not receive heparin w/ HD   2. 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 PTH 583.2 on 12/11/12.   3. HTN: Controlled. On metoprolol   4. Other medical problems addressed by primary team and other consultants: - Lower GI bleed: Hb 8.1 yesterday s/p transfusion 3 units total. Colonoscopy showed non bleeding diverticula and sessile polyp that was biopsied. 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 Transfuse PRN (pt on aranesp)  -  Right Pleural effusion: s/p Thoracocentesis and VATS. Extubated and doing well- F/u by Pulmonology and CT surgery.     LOS: 13 days   D. Piloto Philippa Sicks, MD Family Medicine  PGY-2

## 2012-12-23 NOTE — Progress Notes (Signed)
5 Days Post-Op Procedure(s) (LRB): VIDEO ASSISTED THORACOSCOPY (Right) DRAINAGE OF PLEURAL EFFUSION (Right) Subjective: Overall feels well, c/o sore throat He is very upset about an incident with nursing last night while trying to get out of bed   Objective: Vital signs in last 24 hours: Temp:  [98.4 F (36.9 C)-99.1 F (37.3 C)] 98.4 F (36.9 C) (01/14 0740) Pulse Rate:  [71-94] 76  (01/14 0800) Cardiac Rhythm:  [-] Normal sinus rhythm (01/14 0400) Resp:  [0-25] 12  (01/14 0400) BP: (82-125)/(50-77) 119/74 mmHg (01/14 0800) SpO2:  [93 %-100 %] 97 % (01/14 0800) Weight:  [223 lb 15.8 oz (101.6 kg)-224 lb 10.4 oz (101.9 kg)] 223 lb 15.8 oz (101.6 kg) (01/14 0600)  Hemodynamic parameters for last 24 hours:    Intake/Output from previous day: 01/13 0701 - 01/14 0700 In: 1120.7 [P.O.:600; I.V.:516.7; IV Piggyback:4] Out: 2090 [Chest Tube:140] Intake/Output this shift: Total I/O In: 20 [I.V.:20] Out: -   General appearance: alert and no distress Neurologic: intact Heart: regular rate and rhythm Lungs: diminished breath sounds right + air leak  Lab Results:  [facility] 12/22/12 0340  WBC 8.6  HGB 8.1*  HCT 26.3*  PLT 180   BMET:  Basename 12/22/12 0340  NA 135  K 5.1  CL 95*  CO2 27  GLUCOSE 117*  BUN 48*  CREATININE 15.26*  CALCIUM 8.9    PT/INR: No results found for this basename: LABPROT,INR in the last 72 hours ABG    Component Value Date/Time   PHART 7.566* 12/19/2012 0535   HCO3 26.9* 12/19/2012 0535   TCO2 28 12/19/2012 0535   O2SAT 100.0 12/19/2012 0535   CBG (last 3)  No results found for this basename: GLUCAP:3 in the last 72 hours  Assessment/Plan: S/P Procedure(s) (LRB): VIDEO ASSISTED THORACOSCOPY (Right) DRAINAGE OF PLEURAL EFFUSION (Right) Plan for transfer to step-down: see transfer orders Still has air leak- keep CT on water seal Ambulate Had HD yesterday Awaiting 3300 bed   LOS: 12 days    Kilee Hedding C 12/23/2012

## 2012-12-23 NOTE — Progress Notes (Signed)
Patient ID: [name] [name], male   DOB: [birth year], 52 y.o.   MRN: [id number]                   [address]            [place] [id number]          [phone]     5 Days Post-Op Procedure(s) (LRB): VIDEO ASSISTED THORACOSCOPY (Right) DRAINAGE OF PLEURAL EFFUSION (Right)  Total Length of Stay:  LOS: 12 days  BP 113/71  Pulse 75  Temp 98.4 F (36.9 C) (Oral)  Resp 18  Ht 6\' 1"  (1.854 m)  Wt 223 lb 15.8 oz (101.6 kg)  BMI 29.55 kg/m2  SpO2 98%  .Intake/Output      01/13 0701 - 01/14 0700 01/14 0701 - 01/15 0700   P.O. 600 120   I.V. (mL/kg) 516.7 (5.1) 224 (2.2)   IV Piggyback 4    Total Intake(mL/kg) 1120.7 (11) 344 (3.4)   Urine (mL/kg/hr)     Other 1950    Chest Tube 140 0   Total Output 2090 0   Net -969.3 +344        Stool Occurrence 1 x 1 x        . dextrose 5 % and 0.45% NaCl 20 mL/hr at 12/22/12 0400     Lab Results  Component Value Date   WBC 8.6 12/22/2012   HGB 8.1* 12/22/2012   HCT 26.3* 12/22/2012   PLT 180 12/22/2012   GLUCOSE 117* 12/22/2012   ALT 6 12/20/2012   AST 12 12/20/2012   NA 135 12/22/2012   K 5.1 12/22/2012   CL 95* 12/22/2012   CREATININE 15.26* 12/22/2012   BUN 48* 12/22/2012   CO2 27 12/22/2012   INR 1.10 12/17/2012   Still with air leak from ct Waiting for 3300 bed  Grace Isaac MD  Beeper [phone] Office [phone] 12/23/2012 6:09 PM

## 2012-12-23 NOTE — Progress Notes (Addendum)
Nursing Note:  Progress note from 2300-0700. Patient overnight complained of stomach pain. Patient offered medications to help with nausea and pain several times and refused each time. Patient offered to get into the chair and ambulate which he also refused.  [name] Ade RN   Informed by [name] Ade RN of patient's flat affect and refusal of meds.  [name] [name] also informed me that patient was upset over an incident that occurred earlier on night shift.  I attempted to talk with [name] [name] and his wife about the incident but he was unwilling to discuss with me stating that he wanted to talk to [name] [name].  Passed information on to day shift.

## 2012-12-23 NOTE — Progress Notes (Signed)
I have personally seen and examined this patient and agree with the assessment/plan as outlined above by Piloto MD. Noted to be doing better from CTS standpoint. Plan to continue MWF HD schedule- no acute needs today. Brazos Sandoval K.,MD 12/23/2012 9:06 AM

## 2012-12-24 ENCOUNTER — Inpatient Hospital Stay (HOSPITAL_COMMUNITY): Payer: Medicare Other

## 2012-12-24 LAB — BASIC METABOLIC PANEL
BUN: 40 mg/dL — ABNORMAL HIGH (ref 6–23)
CO2: 28 mEq/L (ref 19–32)
Calcium: 8.9 mg/dL (ref 8.4–10.5)
Chloride: 96 mEq/L (ref 96–112)
Creatinine, Ser: 13.22 mg/dL — ABNORMAL HIGH (ref 0.50–1.35)
GFR calc Af Amer: 4 mL/min — ABNORMAL LOW (ref >90)
GFR calc non Af Amer: 4 mL/min — ABNORMAL LOW (ref >90)
Glucose, Bld: 111 mg/dL — ABNORMAL HIGH (ref 70–99)
Potassium: 4.4 mEq/L (ref 3.5–5.1)
Sodium: 135 mEq/L (ref 135–145)

## 2012-12-24 LAB — CBC
HCT: 24 % — ABNORMAL LOW (ref 39.0–52.0)
Hemoglobin: 7.5 g/dL — ABNORMAL LOW (ref 13.0–17.0)
MCH: 29 pg (ref 26.0–34.0)
MCHC: 31.3 g/dL (ref 30.0–36.0)
MCV: 92.7 fL (ref 78.0–100.0)
Platelets: 168 10*3/uL (ref 150–400)
RBC: 2.59 MIL/uL — ABNORMAL LOW (ref 4.22–5.81)
RDW: 15.2 % (ref 11.5–15.5)
WBC: 5.7 10*3/uL (ref 4.0–10.5)

## 2012-12-24 LAB — PREPARE RBC (CROSSMATCH)

## 2012-12-24 MED ORDER — DOXERCALCIFEROL 4 MCG/2ML IV SOLN
INTRAVENOUS | Status: AC
  Administered 2012-12-24: 4.5 ug via INTRAVENOUS
  Filled 2012-12-24: qty 4

## 2012-12-24 NOTE — Progress Notes (Signed)
6 Days Post-Op Procedure(s) (LRB): VIDEO ASSISTED THORACOSCOPY (Right) DRAINAGE OF PLEURAL EFFUSION (Right) Subjective:  [name] [name] has no complaints this morning.  He is currently receiving dialysis.  Objective: Vital signs in last 24 hours: Temp:  [97.9 F (36.6 C)-99.3 F (37.4 C)] 97.9 F (36.6 C) (01/15 0725) Pulse Rate:  [75-98] 88  (01/15 0845) Cardiac Rhythm:  [-] Normal sinus rhythm (01/15 0800) Resp:  [11-20] 14  (01/15 0845) BP: (95-130)/(60-76) 116/64 mmHg (01/15 0845) SpO2:  [95 %-100 %] 100 % (01/15 0845) Weight:  [224 lb 10.4 oz (101.9 kg)] 224 lb 10.4 oz (101.9 kg) (01/15 0725)  Intake/Output from previous day: 01/14 0701 - 01/15 0700 In: 1062 [P.O.:540; I.V.:522] Out: 160 [Chest Tube:160] Intake/Output this shift: Total I/O In: 21.5 [I.V.:21.5] Out: 30 [Chest Tube:30]  General appearance: alert, cooperative and no distress Heart: regular rate and rhythm Lungs: diminished breath sounds right base Abdomen: soft, non-tender; bowel sounds normal; no masses,  no organomegaly Wound: clean and dry  Lab Results:  Basename 12/24/12 0355 12/22/12 0340  WBC 5.7 8.6  HGB 7.5* 8.1*  HCT 24.0* 26.3*  PLT 168 180   BMET:  Basename 12/24/12 0355 12/22/12 0340  NA 135 135  K 4.4 5.1  CL 96 95*  CO2 28 27  GLUCOSE 111* 117*  BUN 40* 48*  CREATININE 13.22* 15.26*  CALCIUM 8.9 8.9    PT/INR: No results found for this basename: LABPROT,INR in the last 72 hours ABG    Component Value Date/Time   PHART 7.566* 12/19/2012 0535   HCO3 26.9* 12/19/2012 0535   TCO2 28 12/19/2012 0535   O2SAT 100.0 12/19/2012 0535   CBG (last 3)  No results found for this basename: GLUCAP:3 in the last 72 hours  Assessment/Plan: S/P Procedure(s) (LRB): VIDEO ASSISTED THORACOSCOPY (Right) DRAINAGE OF PLEURAL EFFUSION (Right)  1. Chest tube- + air leak with cough, 160cc output- leave chest tubes in place 2. Pulm- no change in previous pneumothorax, improvement in right lung  aeration 3. ESRD- currently undergoing HD, Nephrology following 4. Awaiting transfer to 3300   LOS: 13 days    Ellwood Handler 12/24/2012

## 2012-12-24 NOTE — Procedures (Signed)
Patient seen on Hemodialysis. QB 400, UF goal 2.5L Treatment adjusted as needed.  Elmarie Shiley MD [facility]. Office # [phone] Pager # [phone] 8:42 AM

## 2012-12-24 NOTE — Progress Notes (Signed)
6 Days Post-Op Procedure(s) (LRB): VIDEO ASSISTED THORACOSCOPY (Right) DRAINAGE OF PLEURAL EFFUSION (Right) Subjective: Still having incisional pain He's discouraged and wishes he could go home  Objective: Vital signs in last 24 hours: Temp:  [97.9 F (36.6 C)-99.3 F (37.4 C)] 97.9 F (36.6 C) (01/15 0725) Pulse Rate:  [75-98] 88  (01/15 0845) Cardiac Rhythm:  [-] Normal sinus rhythm (01/15 0800) Resp:  [11-20] 14  (01/15 0845) BP: (95-130)/(60-76) 116/64 mmHg (01/15 0845) SpO2:  [95 %-100 %] 100 % (01/15 0845) Weight:  [224 lb 10.4 oz (101.9 kg)] 224 lb 10.4 oz (101.9 kg) (01/15 0725)  Hemodynamic parameters for last 24 hours:    Intake/Output from previous day: 01/14 0701 - 01/15 0700 In: 1062 [P.O.:540; I.V.:522] Out: 160 [Chest Tube:160] Intake/Output this shift: Total I/O In: 21.5 [I.V.:21.5] Out: 30 [Chest Tube:30]  General appearance: alert and no distress Heart: regular rate and rhythm Lungs: clear to auscultation bilaterally Wound: intact Air leak slightly decreased  Lab Results:  Basename 12/24/12 0355 12/22/12 0340  WBC 5.7 8.6  HGB 7.5* 8.1*  HCT 24.0* 26.3*  PLT 168 180   BMET:  Basename 12/24/12 0355 12/22/12 0340  NA 135 135  K 4.4 5.1  CL 96 95*  CO2 28 27  GLUCOSE 111* 117*  BUN 40* 48*  CREATININE 13.22* 15.26*  CALCIUM 8.9 8.9    PT/INR: No results found for this basename: LABPROT,INR in the last 72 hours ABG    Component Value Date/Time   PHART 7.566* 12/19/2012 0535   HCO3 26.9* 12/19/2012 0535   TCO2 28 12/19/2012 0535   O2SAT 100.0 12/19/2012 0535   CBG (last 3)  No results found for this basename: GLUCAP:3 in the last 72 hours  Assessment/Plan: S/P Procedure(s) (LRB): VIDEO ASSISTED THORACOSCOPY (Right) DRAINAGE OF PLEURAL EFFUSION (Right) Plan for transfer to step-down: see transfer orders Still awaiting transfer bed Will d/c anterior CT , keep remaining tubes to water seal Ambulate On HD presently   LOS: 13 days     Timberlyn Pickford C 12/24/2012

## 2012-12-24 NOTE — Progress Notes (Signed)
I have personally seen and examined this patient and agree with the assessment/plan as outlined above by Dr.Piloto. Plan to continue HD on current MWF schedule as CTS manages the patient s/p VATS/chest tube Adeana Grilliot K.,MD 12/24/2012 8:43 AM

## 2012-12-24 NOTE — Progress Notes (Signed)
Patient ID: [name] [name], male   DOB: 1961-05-19, 52 y.o.   MRN: [id number]  SICU Evening Rounds:  Hemodynamically stable Up in chair, ate dinner Awaiting bed on 3300.

## 2012-12-25 ENCOUNTER — Inpatient Hospital Stay (HOSPITAL_COMMUNITY): Payer: Medicare Other

## 2012-12-25 LAB — TYPE AND SCREEN
ABO/RH(D): A POS
Antibody Screen: NEGATIVE

## 2012-12-25 NOTE — Progress Notes (Addendum)
7 Days Post-Op Procedure(s) (LRB): VIDEO ASSISTED THORACOSCOPY (Right) DRAINAGE OF PLEURAL EFFUSION (Right) Subjective:  Mr. Delrosario has no complaints this morning. Continues to have pain which is manageable with medications.   Objective: Vital signs in last 24 hours: Temp:  [98.2 F (36.8 C)-99.2 F (37.3 C)] 99 F (37.2 C) (01/16 0400) Pulse Rate:  [67-101] 76  (01/16 0700) Cardiac Rhythm:  [-] Normal sinus rhythm (01/16 0400) Resp:  [2-23] 22  (01/16 0700) BP: (96-131)/(59-93) 108/66 mmHg (01/16 0400) SpO2:  [94 %-100 %] 96 % (01/16 0700) Weight:  [222 lb 3.6 oz (100.8 kg)-222 lb 10.6 oz (101 kg)] 222 lb 10.6 oz (101 kg) (01/16 0600)  Intake/Output from previous day: 01/15 0701 - 01/16 0700 In: 961 [P.O.:375; I.V.:584; IV Piggyback:2] Out: [id number] [Chest Tube:150] Intake/Output this shift:  General appearance: alert, cooperative and no distress Heart: regular rate and rhythm Lungs: clear to auscultation bilaterally Wound: clean and dry  Lab Results:  [facility] 12/24/12 0355  WBC 5.7  HGB 7.5*  HCT 24.0*  PLT 168   BMET:  Basename 12/24/12 0355  NA 135  K 4.4  CL 96  CO2 28  GLUCOSE 111*  BUN 40*  CREATININE 13.22*  CALCIUM 8.9    PT/INR: No results found for this basename: LABPROT,INR in the last 72 hours ABG    Component Value Date/Time   PHART 7.566* 12/19/2012 0535   HCO3 26.9* 12/19/2012 0535   TCO2 28 12/19/2012 0535   O2SAT 100.0 12/19/2012 0535   CBG (last 3)  No results found for this basename: GLUCAP:3 in the last 72 hours  Assessment/Plan: S/P Procedure(s) (LRB): VIDEO ASSISTED THORACOSCOPY (Right) DRAINAGE OF PLEURAL EFFUSION (Right)  1. Chest tubes- large air leak with cough, on water seal anterior tube removed yesterday, CXR stable with no further enlargement of apical pneumothorax 2. Pulm- lung aeration continues to improve, continue IS 3. Renal-ESRD, HD yesterday, Nephrology following 4. Dispo- likely leave chest tubes in place, awaiting  bed in step down    LOS: 14 days    BARRETT, ERIN 12/25/2012   He still has an air leak, primarily coming from the diaphragmatic tube There is extreme respiratory tidal variation and a moderate leak CXR stable He's ambulating well Transfer to 2000

## 2012-12-25 NOTE — Progress Notes (Signed)
I have personally seen and examined this patient and agree with the assessment/plan as outlined above by Losq MD (PGY2). Plans for HD tomorrow as management of chest tubes continue s/p VATS.  Madelline Eshbach K.,MD 12/25/2012 9:25 AM

## 2012-12-25 NOTE — Progress Notes (Addendum)
NUTRITION FOLLOW UP  Intervention:    Recommend Renal 80/90-01-11-1199 ml diet  Continue Nepro Carb Steady as needed (425 kcals, 19.1 gm protein per 8 fl oz bottle) RD to follow for nutrition care plan  Nutrition Dx:   Increased nutrient needs related to ESRD, hemodialysis as evidenced by estimated nutrition needs, ongoing  Goal:   Oral intake with meals & supplements to meet >/= 90% of estimated nutrition needs, progressing  Monitor:   PO & supplemental intake, weight, labs, I/O's  Assessment:   Patient s/p procedures 1/9: VIDEO ASSISTED THORACOSCOPY (Right)  DRAINAGE OF PLEURAL EFFUSION (Right)  PO intake 50-100% per flowsheet records.  Continues to receive dialysis. Refusing Prostat liquid protein with meals.  Nepro Carb Steady supplement in place as needed.  Plans to transfer to 2000.  Height: Ht Readings from Last 1 Encounters:  12/11/12 6\' 1"  (1.854 m)    Weight Status:   Wt Readings from Last 1 Encounters:  12/25/12 222 lb 10.6 oz (101 kg)    Re-estimated needs:  Kcal: 2300-2500 Protein: 120-130 gm Fluid: 2.3-2.5 L  Skin: surgical chest incision   Diet Order: Renal 60/70-01-11-1199 ml   Intake/Output Summary (Last 24 hours) at 12/25/12 1508 Last data filed at 12/25/12 1400  Gross per 24 hour  Intake  906.5 ml  Output    120 ml  Net  786.5 ml    Labs:   Lab 12/24/12 0355 12/22/12 0340 12/20/12 0416  NA 135 135 140  K 4.4 5.1 4.3  CL 96 95* 98  CO2 28 27 29   BUN 40* 48* 24*  CREATININE 13.22* 15.26* 9.05*  CALCIUM 8.9 8.9 8.3*  MG -- -- --  PHOS -- -- --  GLUCOSE 111* 117* 142*    Scheduled Meds:   . bisacodyl  10 mg Oral Daily  . calcium acetate  2,668 mg Oral TID WC  . cinacalcet  180 mg Oral Q supper  . darbepoetin (ARANESP) injection - DIALYSIS  60 mcg Intravenous Q Fri-HD  . doxercalciferol  4.5 mcg Intravenous Q M,W,F-HD  . enoxaparin  30 mg Subcutaneous Q24H  . feeding supplement  30 mL Oral TID WC  . fentaNYL   Intravenous Q4H  .  metoprolol tartrate  25 mg Oral BID  . multivitamin  1 tablet Oral QHS  . ondansetron (ZOFRAN) IV  4 mg Intravenous Q6H  . sodium chloride  10 mL Intravenous Q12H    Continuous Infusions:   . dextrose 5 % and 0.45% NaCl 20 mL/hr at 12/25/12 1400    Arthur Holms, RD, LDN Pager #: [phone] After-Hours Pager #: [phone]

## 2012-12-25 NOTE — Progress Notes (Signed)
[facility]  Subjective:  Clinically better. Wants right IJ out. No dizziness, no nausea, no vomiting.  Objective: Vital signs in last 24 hours: Blood pressure 108/66, pulse 76, temperature 99 F (37.2 C), temperature source Oral, resp. rate 22, height 6\' 1"  (1.854 m), weight 222 lb 10.6 oz (101 kg), SpO2 96.00%.   PHYSICAL EXAM  GEN:NAD  Neck: right IJ in place Chest: Diminish breath sound on R. No crackles or wheezing  Cardio: regular rate and rhythm, S1, S2 normal, no murmur.  GI: soft, non-tender  Extremities: R arm graft patent, trace edema  Pulses: 2+ and symmetric  Neurologic: No focal deficit, grossly normal  Lab Results:   Lab 12/24/12 0355 12/22/12 0340 12/20/12 0416  NA 135 135 140  K 4.4 5.1 4.3  CL 96 95* 98  CO2 28 27 29   BUN 40* 48* 24*  CREATININE 13.22* 15.26* 9.05*  ALB -- -- --  GLUCOSE 111* -- --  CALCIUM 8.9 8.9 8.3*  PHOS -- -- --     Basename 12/24/12 0355  WBC 5.7  HGB 7.5*  HCT 24.0*  PLT 168     Scheduled Meds:    . bisacodyl  10 mg Oral Daily  . calcium acetate  2,668 mg Oral TID WC  . cinacalcet  180 mg Oral Q supper  . darbepoetin (ARANESP) injection - DIALYSIS  60 mcg Intravenous Q Fri-HD  . doxercalciferol  4.5 mcg Intravenous Q M,W,F-HD  . enoxaparin  30 mg Subcutaneous Q24H  . feeding supplement  30 mL Oral TID WC  . fentaNYL   Intravenous Q4H  . metoprolol tartrate  25 mg Oral BID  . multivitamin  1 tablet Oral QHS  . ondansetron (ZOFRAN) IV  4 mg Intravenous Q6H  . sodium chloride  10 mL Intravenous Q12H   Continuous Infusions:    . dextrose 5 % and 0.45% NaCl 20 mL/hr at 12/25/12 0400   PRN Meds:.sodium chloride, sodium chloride, sodium chloride, sodium chloride, calcium acetate, diphenhydrAMINE, diphenhydrAMINE, feeding supplement (NEPRO CARB STEADY), feeding supplement (NEPRO CARB STEADY), heparin, heparin, hydrALAZINE, lidocaine, lidocaine, lidocaine-prilocaine, lidocaine-prilocaine, naloxone,  ondansetron (ZOFRAN) IV, oxyCODONE-acetaminophen, pentafluoroprop-tetrafluoroeth, pentafluoroprop-tetrafluoroeth, potassium chloride, promethazine senna-docusate, sodium chloride  Assessment/Plan:  1. ESRD: Pt on scheduled HD MWF  - Next HD scheduled for tomorrow. Pt will not receive heparin w/ HD   2. 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 PTH 583.2 on 12/11/12.   3. HTN: Controlled. On metoprolol   4. Other medical problems addressed by primary team and other consultants: - Lower GI bleed: Hb 7.5 yesterday, transfused 1 u yesterday. Colonoscopy showed non bleeding diverticula and sessile polyp that was biopsied. 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 Transfuse PRN (pt on aranesp)  -  Right Pleural effusion: s/p Thoracocentesis and VATS. Extubated and doing well- F/u by Pulmonology and CT surgery.     LOS: 14 days   Liam Graham, PGY-2 Family Medicine Resident

## 2012-12-26 ENCOUNTER — Inpatient Hospital Stay (HOSPITAL_COMMUNITY): Payer: Medicare Other

## 2012-12-26 LAB — RENAL FUNCTION PANEL
Albumin: 2.4 g/dL — ABNORMAL LOW (ref 3.5–5.2)
CO2: 26 mEq/L (ref 19–32)
Chloride: 96 mEq/L (ref 96–112)
Creatinine, Ser: 13.04 mg/dL — ABNORMAL HIGH (ref 0.50–1.35)
GFR calc Af Amer: 4 mL/min — ABNORMAL LOW (ref >90)
GFR calc non Af Amer: 4 mL/min — ABNORMAL LOW (ref >90)
Potassium: 4.5 mEq/L (ref 3.5–5.1)
Sodium: 135 mEq/L (ref 135–145)

## 2012-12-26 LAB — CBC
MCV: 91.1 fL (ref 78.0–100.0)
Platelets: 152 10*3/uL (ref 150–400)
RBC: 2.91 MIL/uL — ABNORMAL LOW (ref 4.22–5.81)
WBC: 5.2 10*3/uL (ref 4.0–10.5)

## 2012-12-26 MED ORDER — DARBEPOETIN ALFA-POLYSORBATE 60 MCG/0.3ML IJ SOLN
INTRAMUSCULAR | Status: AC
  Administered 2012-12-26: 60 ug via INTRAVENOUS
  Filled 2012-12-26: qty 0.3

## 2012-12-26 MED ORDER — PNEUMOCOCCAL VAC POLYVALENT 25 MCG/0.5ML IJ INJ
0.5000 mL | INJECTION | INTRAMUSCULAR | Status: AC
  Administered 2012-12-27: 0.5 mL via INTRAMUSCULAR
  Filled 2012-12-26: qty 0.5

## 2012-12-26 MED ORDER — DOXERCALCIFEROL 4 MCG/2ML IV SOLN
INTRAVENOUS | Status: AC
  Administered 2012-12-26: 4.5 ug via INTRAVENOUS
  Filled 2012-12-26: qty 4

## 2012-12-26 NOTE — Progress Notes (Addendum)
8 Days Post-Op Procedure(s) (LRB): VIDEO ASSISTED THORACOSCOPY (Right) DRAINAGE OF PLEURAL EFFUSION (Right) Subjective:  [name] just returned from dialysis.  States just needs to get his strength back this afternoon  Objective: Vital signs in last 24 hours: Temp:  [97.6 F (36.4 C)-98.8 F (37.1 C)] 98.5 F (36.9 C) (01/17 1155) Pulse Rate:  [46-89] 89  (01/17 1245) Cardiac Rhythm:  [-] Normal sinus rhythm (01/17 1217) Resp:  [11-20] 20  (01/17 1155) BP: (115-145)/(67-87) 137/78 mmHg (01/17 1245) SpO2:  [94 %-100 %] 96 % (01/17 1155) Weight:  [217 lb 6 oz (98.6 kg)-224 lb 10.4 oz (101.9 kg)] 217 lb 6 oz (98.6 kg) (01/17 1155)    Intake/Output from previous day: 01/16 0701 - 01/17 0700 In: 553 [P.O.:100; I.V.:453] Out: 140 [Chest Tube:140] Intake/Output this shift: Total I/O In: -  Out: 2002 [Other:2002]  General appearance: alert, cooperative and no distress Heart: regular rate and rhythm Lungs: clear to auscultation bilaterally Abdomen: soft, non-tender; bowel sounds normal; no masses,  no organomegaly Wound: clean and dry  Lab Results:  [facility] 12/26/12 0616 12/24/12 0355  WBC 5.2 5.7  HGB 8.4* 7.5*  HCT 26.5* 24.0*  PLT 152 168   BMET:  Basename 12/26/12 0616 12/24/12 0355  NA 135 135  K 4.5 4.4  CL 96 96  CO2 26 28  GLUCOSE 87 111*  BUN 39* 40*  CREATININE 13.04* 13.22*  CALCIUM 9.4 8.9    PT/INR: No results found for this basename: LABPROT,INR in the last 72 hours ABG    Component Value Date/Time   PHART 7.566* 12/19/2012 0535   HCO3 26.9* 12/19/2012 0535   TCO2 28 12/19/2012 0535   O2SAT 100.0 12/19/2012 0535   CBG (last 3)  No results found for this basename: GLUCAP:3 in the last 72 hours  Assessment/Plan: S/P Procedure(s) (LRB): VIDEO ASSISTED THORACOSCOPY (Right) DRAINAGE OF PLEURAL EFFUSION (Right)  1. Chest tubes- continued air leak, right pneumothorax remains stable 2. Pulm- off oxygen, continue IS 3. Renal- ESRD, HD done this  morning 4. Dispo- continued air leak, leave chest tubes in place, management per Dr. Roxan Hockey  LOS: 15 days    [name] [name] 12/26/2012   Feels well this afternoon. Some pain in right shoulder- likely from surgery  He still has a fairly big air leak- can't isolate it to one tube so we need to keep both for now.   If it persists into next week may need to consider re-exploration or valves

## 2012-12-26 NOTE — Progress Notes (Signed)
I have personally seen and examined this patient and agree with the assessment/plan as outlined above by Losq MD (PGY2). Plan to continue HD on MWF schedule as CTS manages his Chest tubes/PTX. Quilla Freeze K.,MD 12/26/2012 9:49 AM

## 2012-12-26 NOTE — Procedures (Signed)
Patient seen on Hemodialysis. QB 400, UF goal 2.5L Treatment adjusted as needed.  Elmarie Shiley MD [facility]. Office # [phone] Pager # [phone] 9:50 AM

## 2012-12-26 NOTE — Progress Notes (Signed)
[facility]  Subjective:  Pain from chest tube area, controled with percocet. Otherwise, no acute events.   Objective: Vital signs in last 24 hours: Blood pressure 115/70, pulse 69, temperature 98.5 F (36.9 C), temperature source Oral, resp. rate 17, height 6\' 1"  (1.854 m), weight 222 lb 10.6 oz (101 kg), SpO2 97.00%.   PHYSICAL EXAM  GEN:NAD, sleeping comfortable and arousable  Neck: right IJ in place Chest: Diminish breath sound on R. No crackles or wheezing  Cardio: regular rate and rhythm, S1, S2 normal, no murmur.  GI: soft, non-tender  Extremities: R arm graft patent, trace edema  Pulses: 2+ and symmetric  Neurologic: No focal deficit, grossly normal  Lab Results:   Lab 12/24/12 0355 12/22/12 0340 12/20/12 0416  NA 135 135 140  K 4.4 5.1 4.3  CL 96 95* 98  CO2 28 27 29   BUN 40* 48* 24*  CREATININE 13.22* 15.26* 9.05*  ALB -- -- --  GLUCOSE 111* -- --  CALCIUM 8.9 8.9 8.3*  PHOS -- -- --     Basename 12/24/12 0355  WBC 5.7  HGB 7.5*  HCT 24.0*  PLT 168     Scheduled Meds:    . bisacodyl  10 mg Oral Daily  . calcium acetate  2,668 mg Oral TID WC  . cinacalcet  180 mg Oral Q supper  . darbepoetin (ARANESP) injection - DIALYSIS  60 mcg Intravenous Q Fri-HD  . doxercalciferol  4.5 mcg Intravenous Q M,W,F-HD  . enoxaparin  30 mg Subcutaneous Q24H  . fentaNYL   Intravenous Q4H  . metoprolol tartrate  25 mg Oral BID  . multivitamin  1 tablet Oral QHS  . ondansetron (ZOFRAN) IV  4 mg Intravenous Q6H  . sodium chloride  10 mL Intravenous Q12H   Continuous Infusions:    . dextrose 5 % and 0.45% NaCl 20 mL/hr at 12/25/12 1500   PRN Meds:.sodium chloride, sodium chloride, sodium chloride, sodium chloride, calcium acetate, diphenhydrAMINE, diphenhydrAMINE, feeding supplement (NEPRO CARB STEADY), feeding supplement (NEPRO CARB STEADY), heparin, heparin, hydrALAZINE, lidocaine, lidocaine, lidocaine-prilocaine, lidocaine-prilocaine, naloxone,  ondansetron (ZOFRAN) IV, oxyCODONE-acetaminophen, pentafluoroprop-tetrafluoroeth, pentafluoroprop-tetrafluoroeth, potassium chloride, promethazine senna-docusate, sodium chloride  Assessment/Plan:  1. ESRD: Pt on scheduled HD MWF  - Dialysis today. Pt will not receive heparin w/ HD  - renal panel in dialysis  2. Sec Hyperparathyroid: continue sensipar and hectorol. PTH 583.2 on 12/11/12.   3. HTN: Controlled. On metoprolol   4. Other medical problems addressed by primary team and other consultants: - Lower GI bleed: Hb 7.5 on 12/24/12 transfused 1 u as result. CBC to be done in hemodyalisis Colonoscopy showed non bleeding diverticula and sessile polyp that was biopsied. 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 Transfuse PRN (pt on aranesp)  -  Right Pleural effusion: s/p Thoracocentesis and VATS. Extubated and doing well- F/u by Pulmonology and CT surgery.     LOS: 15 days   Liam Graham, PGY-2 Family Medicine Resident

## 2012-12-27 ENCOUNTER — Inpatient Hospital Stay (HOSPITAL_COMMUNITY): Payer: Medicare Other

## 2012-12-27 LAB — CALCIUM, IONIZED: Calcium, Ion: 1.27 mmol/L — ABNORMAL HIGH (ref 1.12–1.32)

## 2012-12-27 NOTE — Progress Notes (Addendum)
[address]            [name] [id number]          [phone]     9 Days Post-Op  Procedure(s) (LRB): VIDEO ASSISTED THORACOSCOPY (Right) DRAINAGE OF PLEURAL EFFUSION (Right) Subjective Feels about the same  Objective  Telemetry sinus with pvc's  Temp:  [98.1 F (36.7 C)-98.9 F (37.2 C)] 98.6 F (37 C) (01/18 0623) Pulse Rate:  [68-89] 70  (01/18 0623) Resp:  [12-20] 19  (01/18 0623) BP: (107-143)/(61-81) 130/61 mmHg (01/18 0623) SpO2:  [96 %-100 %] 97 % (01/18 0623) Weight:  [217 lb 6 oz (98.6 kg)] 217 lb 6 oz (98.6 kg) (01/17 1155)   Intake/Output Summary (Last 24 hours) at 12/27/12 1000 Last data filed at 12/27/12 0700  Gross per 24 hour  Intake  941.5 ml  Output   2142 ml  Net -1200.5 ml       General appearance: alert, cooperative and no distress Heart: regular rate and rhythm Lungs: faairly clear throughout Abdomen: benign Extremities: no edema Wound: incis healing well  Lab Results:  Basename 12/26/12 0616  NA 135  K 4.5  CL 96  CO2 26  GLUCOSE 87  BUN 39*  CREATININE 13.04*  CALCIUM 9.4  MG --  PHOS 6.0*    Basename 12/26/12 0616  AST --  ALT --  ALKPHOS --  BILITOT --  PROT --  ALBUMIN 2.4*   No results found for this basename: LIPASE:2,AMYLASE:2 in the last 72 hours  Basename 12/26/12 0616  WBC 5.2  NEUTROABS --  HGB 8.4*  HCT 26.5*  MCV 91.1  PLT 152   No results found for this basename: CKTOTAL:4,CKMB:4,TROPONINI:4 in the last 72 hours No components found with this basename: POCBNP:3 No results found for this basename: DDIMER in the last 72 hours No results found for this basename: HGBA1C in the last 72 hours No results found for this basename: CHOL,HDL,LDLCALC,TRIG,CHOLHDL in the last 72 hours No results found for this basename: TSH,T4TOTAL,FREET3,T3FREE,THYROIDAB in the last 72 hours No results found for this basename: VITAMINB12,FOLATE,FERRITIN,TIBC,IRON,RETICCTPCT in the last 72  hours  Medications: Scheduled    . bisacodyl  10 mg Oral Daily  . calcium acetate  2,668 mg Oral TID WC  . cinacalcet  180 mg Oral Q supper  . darbepoetin (ARANESP) injection - DIALYSIS  60 mcg Intravenous Q Fri-HD  . doxercalciferol  4.5 mcg Intravenous Q M,W,F-HD  . enoxaparin  30 mg Subcutaneous Q24H  . fentaNYL   Intravenous Q4H  . metoprolol tartrate  25 mg Oral BID  . multivitamin  1 tablet Oral QHS  . ondansetron (ZOFRAN) IV  4 mg Intravenous Q6H  . pneumococcal 23 valent vaccine  0.5 mL Intramuscular Tomorrow-1000  . sodium chloride  10 mL Intravenous Q12H     Radiology/Studies:  Dg Chest Port 1 View  12/27/2012  *RADIOLOGY REPORT*  Clinical Data: Follow up pneumothorax  PORTABLE CHEST - 1 VIEW  Comparison: Prior chest x-ray 12/26/2012  Findings: Stable right apical pneumothorax with approximately 10 - 15% in volume loss of the right upper lobe.  There is a similar degree of pleural displacement (30 1 mm).  To right-sided thoracostomy tubes remain in place.  Left IJ approach central venous catheter is in unchanged position.  The tip projects over the midline.  If the catheter is venous, this would be  within the left brachiocephalic vein.  Unchanged mild cardiomegaly.  Diffuse pleural thickening versus residual fluid on the right is similar to prior.  Improving opacities in the right mid lung.  IMPRESSION:  1.  Stable right apical pneumothorax. 2.  Improving opacities in the right midlung. 3.  Similar degree of residual pleural fluid versus pleural thickening 4.  The left IJ central venous catheter projects over the midline. If venous, it is within the mid left brachiocephalic vein.  If there is concern for arterial placement, recommend confirmation with blood gas evaluation drawn through the line.   Original Report Authenticated By: Jacqulynn Cadet, M.D.    Dg Chest Port 1 View  12/26/2012  *RADIOLOGY REPORT*  Clinical Data: Chest tubes in place.  Follow-up pneumothorax.  PORTABLE  CHEST - 1 VIEW  Comparison: 12/25/2012.  Findings: Persistent right apical pneumothorax, possibly loculated, without significant change since previous study.  To right chest tubes remain unchanged in position.  There is atelectasis or consolidation in the right lung.  The left lung appears to be clear expanded.  Mild cardiac enlargement with normal pulmonary vascularity.  Left central venous catheter is unchanged.  IMPRESSION: Stable appearance of right apical pneumothorax and pulmonary atelectasis with to right chest tubes in place.   Original Report Authenticated By: Lucienne Capers, M.D.     INR: Will add last result for INR, ABG once components are confirmed Will add last 4 CBG results once components are confirmed   Chest tube - cont air leak, large Assessment/Plan: S/P Procedure(s) (LRB): VIDEO ASSISTED THORACOSCOPY (Right) DRAINAGE OF PLEURAL EFFUSION (Right)  1 conts current management with plan as outlined in previous note      LOS: 16 days    [name] [name] 1/18/201410:00 AM    Still with air leak from both tubes I have seen and examined [name] [name] and agree with the above assessment  and plan.  Grace Isaac MD [facility] [phone] Office [phone] 12/27/2012 11:27 AM

## 2012-12-27 NOTE — Progress Notes (Signed)
Wasted 60 mcg of Fentanyl in sink following PCA change-out.  Notified pharmacy, no method to waste in [place].  [name] [name]

## 2012-12-28 MED ORDER — DARBEPOETIN ALFA-POLYSORBATE 100 MCG/0.5ML IJ SOLN: 100.0000 ug | INTRAMUSCULAR | Status: DC

## 2012-12-28 NOTE — Progress Notes (Signed)
[address]            [place] [address]          [phone]     10 Days Post-Op  Procedure(s) (LRB): VIDEO ASSISTED THORACOSCOPY (Right) DRAINAGE OF PLEURAL EFFUSION (Right) Subjective: Tired today, no new c/o  Objective  Telemetry sinus with pvc's  Temp:  [98 F (36.7 C)-98.7 F (37.1 C)] 98 F (36.7 C) (01/19 0720) Pulse Rate:  [65-78] 65  (01/19 0720) Resp:  [18-20] 18  (01/19 0720) BP: (122-131)/(63-81) 128/66 mmHg (01/19 0720) SpO2:  [95 %-99 %] 95 % (01/19 0720)   Intake/Output Summary (Last 24 hours) at 12/28/12 0926 Last data filed at 12/27/12 1700  Gross per 24 hour  Intake    240 ml  Output    100 ml  Net    140 ml    Physical Examination: General appearance - alert, well appearing, and in no distress Chest - good air movement throughout Heart - RRR   Lab Results:  [facility] 12/26/12 0616  NA 135  K 4.5  CL 96  CO2 26  GLUCOSE 87  BUN 39*  CREATININE 13.04*  CALCIUM 9.4  MG --  PHOS 6.0*    Basename 12/26/12 0616  AST --  ALT --  ALKPHOS --  BILITOT --  PROT --  ALBUMIN 2.4*   No results found for this basename: LIPASE:2,AMYLASE:2 in the last 72 hours  Basename 12/26/12 0616  WBC 5.2  NEUTROABS --  HGB 8.4*  HCT 26.5*  MCV 91.1  PLT 152   No results found for this basename: CKTOTAL:4,CKMB:4,TROPONINI:4 in the last 72 hours No components found with this basename: POCBNP:3 No results found for this basename: DDIMER in the last 72 hours No results found for this basename: HGBA1C in the last 72 hours No results found for this basename: CHOL,HDL,LDLCALC,TRIG,CHOLHDL in the last 72 hours No results found for this basename: TSH,T4TOTAL,FREET3,T3FREE,THYROIDAB in the last 72 hours No results found for this basename: VITAMINB12,FOLATE,FERRITIN,TIBC,IRON,RETICCTPCT in the last 72 hours  Medications: Scheduled    . bisacodyl  10 mg Oral Daily  . calcium acetate  2,668 mg Oral TID WC  . cinacalcet   180 mg Oral Q supper  . darbepoetin (ARANESP) injection - DIALYSIS  100 mcg Intravenous Q Fri-HD  . doxercalciferol  4.5 mcg Intravenous Q M,W,F-HD  . enoxaparin  30 mg Subcutaneous Q24H  . fentaNYL   Intravenous Q4H  . metoprolol tartrate  25 mg Oral BID  . multivitamin  1 tablet Oral QHS  . ondansetron (ZOFRAN) IV  4 mg Intravenous Q6H  . sodium chloride  10 mL Intravenous Q12H     Radiology/Studies:  Dg Chest Port 1 View  12/27/2012  *RADIOLOGY REPORT*  Clinical Data: Follow up pneumothorax  PORTABLE CHEST - 1 VIEW  Comparison: Prior chest x-ray 12/26/2012  Findings: Stable right apical pneumothorax with approximately 10 - 15% in volume loss of the right upper lobe.  There is a similar degree of pleural displacement (30 1 mm).  To right-sided thoracostomy tubes remain in place.  Left IJ approach central venous catheter is in unchanged position.  The tip projects over the midline.  If the catheter is venous, this would be within the left brachiocephalic vein.  Unchanged mild cardiomegaly.  Diffuse pleural thickening versus residual fluid on the right is similar to prior.  Improving opacities  in the right mid lung.  IMPRESSION:  1.  Stable right apical pneumothorax. 2.  Improving opacities in the right midlung. 3.  Similar degree of residual pleural fluid versus pleural thickening 4.  The left IJ central venous catheter projects over the midline. If venous, it is within the mid left brachiocephalic vein.  If there is concern for arterial placement, recommend confirmation with blood gas evaluation drawn through the line.   Original Report Authenticated By: Jacqulynn Cadet, M.D.     INR: Will add last result for INR, ABG once components are confirmed Will add last 4 CBG results once components are confirmed  Assessment/Plan: S/P Procedure(s) (LRB): VIDEO ASSISTED THORACOSCOPY (Right) DRAINAGE OF PLEURAL EFFUSION (Right)  1 less air leak, isolated to anterior tube today. Conts current tx/rx  for now    LOS: 17 days    GOLD,WAYNE E 1/19/20149:26 AM

## 2012-12-28 NOTE — Progress Notes (Signed)
Very small mount of bright red blood after large bowel movement

## 2012-12-28 NOTE — Progress Notes (Addendum)
Patient ID: [name] [name], male   DOB: 08/01/1961, 52 y.o.   MRN: [id number]   [facility] Progress Note    Subjective:   Reports to be feeling fair- occasional discomfort from chest tubes and back pain noted   Objective:   BP 128/66  Pulse 65  Temp 98 F (36.7 C) (Oral)  Resp 18  Ht 6\' 1"  (1.854 m)  Wt 98.6 kg (217 lb 6 oz)  BMI 28.68 kg/m2  SpO2 95%  Physical Exam: [id number] resting in bed [id number] RRR, normal S1 and S2  Resp:CTA bilaterally, suboptimal inspiratory effort due to splinting from pain. Right sided chest tube in-situ [id number], obese, NT, BS normal Ext:No LE edema  Labs: BMET  Lab 12/26/12 0616 12/24/12 0355 12/22/12 0340  NA 135 135 135  K 4.5 4.4 5.1  CL 96 96 95*  CO2 26 28 27   GLUCOSE 87 111* 117*  BUN 39* 40* 48*  CREATININE 13.04* 13.22* 15.26*  ALB -- -- --  CALCIUM 9.4 8.9 8.9  PHOS 6.0* -- --   CBC  Lab 12/26/12 0616 12/24/12 0355 12/22/12 0340  WBC 5.2 5.7 8.6  NEUTROABS -- -- --  HGB 8.4* 7.5* 8.1*  HCT 26.5* 24.0* 26.3*  MCV 91.1 92.7 93.6  PLT 152 168 180    Medications:      . bisacodyl  10 mg Oral Daily  . calcium acetate  2,668 mg Oral TID WC  . cinacalcet  180 mg Oral Q supper  . darbepoetin (ARANESP) injection - DIALYSIS  60 mcg Intravenous Q Fri-HD  . doxercalciferol  4.5 mcg Intravenous Q M,W,F-HD  . enoxaparin  30 mg Subcutaneous Q24H  . fentaNYL   Intravenous Q4H  . metoprolol tartrate  25 mg Oral BID  . multivitamin  1 tablet Oral QHS  . ondansetron (ZOFRAN) IV  4 mg Intravenous Q6H  . sodium chloride  10 mL Intravenous Q12H     Assessment/ Plan:   1. S/P right chest VATS for evacuation of pleural effusion and post-op pneumothorax- with Chest tubes- management per CTS.  2. ESRD: will continue HD on MWF with HD planned for tomorrow- no labs available from this morning and clinically without acute needs 3. 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 Await labs from tomorrow to determine need for dose changes. 5. Nutrition: On renal diet/ renavite- anticipate some albumin drop post-op with inflammatory state 6. Hypertension: Well controlled on metoprolol   Elmarie Shiley, MD 12/28/2012, 8:28 AM

## 2012-12-29 ENCOUNTER — Inpatient Hospital Stay (HOSPITAL_COMMUNITY): Payer: Medicare Other

## 2012-12-29 LAB — RENAL FUNCTION PANEL
BUN: 47 mg/dL — ABNORMAL HIGH (ref 6–23)
CO2: 28 mEq/L (ref 19–32)
Calcium: 9.6 mg/dL (ref 8.4–10.5)
Creatinine, Ser: 14.98 mg/dL — ABNORMAL HIGH (ref 0.50–1.35)
GFR calc Af Amer: 4 mL/min — ABNORMAL LOW (ref >90)
Glucose, Bld: 116 mg/dL — ABNORMAL HIGH (ref 70–99)
Phosphorus: 5.2 mg/dL — ABNORMAL HIGH (ref 2.3–4.6)

## 2012-12-29 LAB — CBC
HCT: 27.4 % — ABNORMAL LOW (ref 39.0–52.0)
Hemoglobin: 8.6 g/dL — ABNORMAL LOW (ref 13.0–17.0)
MCH: 29.2 pg (ref 26.0–34.0)
MCHC: 31.4 g/dL (ref 30.0–36.0)
MCV: 92.9 fL (ref 78.0–100.0)
RDW: 15.3 % (ref 11.5–15.5)

## 2012-12-29 MED ORDER — DOXERCALCIFEROL 4 MCG/2ML IV SOLN
INTRAVENOUS | Status: AC
  Administered 2012-12-29: 4.5 ug via INTRAVENOUS
  Filled 2012-12-29: qty 2

## 2012-12-29 MED ORDER — SODIUM CHLORIDE 0.9 % IJ SOLN
10.0000 mL | INTRAMUSCULAR | Status: DC | PRN
  Administered 2012-12-29: 20 mL

## 2012-12-29 MED ORDER — NEPRO/CARBSTEADY PO LIQD
237.0000 mL | ORAL | Status: DC | PRN
  Filled 2012-12-29: qty 237

## 2012-12-29 MED ORDER — SODIUM CHLORIDE 0.9 % IJ SOLN: 10.0000 mL | Freq: Two times a day (BID) | INTRAMUSCULAR | Status: DC

## 2012-12-29 MED ORDER — SODIUM CHLORIDE 0.9 % IV SOLN: 100.0000 mL | INTRAVENOUS | Status: DC | PRN

## 2012-12-29 MED ORDER — HEPARIN SODIUM (PORCINE) 1000 UNIT/ML DIALYSIS
2000.0000 [IU] | INTRAMUSCULAR | Status: DC | PRN
  Administered 2012-12-29: 2000 [IU] via INTRAVENOUS_CENTRAL

## 2012-12-29 MED ORDER — PENTAFLUOROPROP-TETRAFLUOROETH EX AERO: 1.0000 "application " | INHALATION_SPRAY | CUTANEOUS | Status: DC | PRN

## 2012-12-29 MED ORDER — DOXERCALCIFEROL 4 MCG/2ML IV SOLN
INTRAVENOUS | Status: AC
  Filled 2012-12-29: qty 2

## 2012-12-29 MED ORDER — LIDOCAINE-PRILOCAINE 2.5-2.5 % EX CREA
1.0000 "application " | TOPICAL_CREAM | CUTANEOUS | Status: DC | PRN
  Filled 2012-12-29: qty 5

## 2012-12-29 MED ORDER — HEPARIN SODIUM (PORCINE) 1000 UNIT/ML DIALYSIS: 1000.0000 [IU] | INTRAMUSCULAR | Status: DC | PRN

## 2012-12-29 MED ORDER — ALTEPLASE 2 MG IJ SOLR
2.0000 mg | Freq: Once | INTRAMUSCULAR | Status: DC | PRN
  Filled 2012-12-29: qty 2

## 2012-12-29 MED ORDER — LIDOCAINE HCL (PF) 1 % IJ SOLN: 5.0000 mL | INTRAMUSCULAR | Status: DC | PRN

## 2012-12-29 NOTE — Progress Notes (Addendum)
[address]            [place] [address]          [phone]     11 Days Post-Op  Procedure(s) (LRB): VIDEO ASSISTED THORACOSCOPY (Right) DRAINAGE OF PLEURAL EFFUSION (Right) Subjective: Feels ok, not SOB  Objective  Telemetry sinus with pvc's   Chest tube - air leak appears to be isolated to anterior tube  Temp:  [98 F (36.7 C)-98.2 F (36.8 C)] 98.2 F (36.8 C) (01/20 0423) Pulse Rate:  [60-79] 60  (01/20 0423) Resp:  [16-18] 18  (01/20 0423) BP: (126-141)/(64-80) 126/64 mmHg (01/20 0423) SpO2:  [96 %-98 %] 98 % (01/20 0423) Weight:  [226 lb 1.6 oz (102.558 kg)] 226 lb 1.6 oz (102.558 kg) (01/20 0423)   Intake/Output Summary (Last 24 hours) at 12/29/12 0746 Last data filed at 12/29/12 [id number]  Gross per 24 hour  Intake    240 ml  Output     30 ml  Net    210 ml       General appearance: alert, cooperative and no distress Heart: irregular Lungs: sl dim in right base Abdomen: soft, nontender Wound: dressings CDI  Lab Results: No results found for this basename: NA:2,K:2,CL:2,CO2:2,GLUCOSE:2,BUN:2,CREATININE:2,CALCIUM:2,MG:2,PHOS:2 in the last 72 hours No results found for this basename: AST:2,ALT:2,ALKPHOS:2,BILITOT:2,PROT:2,ALBUMIN:2 in the last 72 hours No results found for this basename: LIPASE:2,AMYLASE:2 in the last 72 hours No results found for this basename: WBC:2,NEUTROABS:2,HGB:2,HCT:2,MCV:2,PLT:2 in the last 72 hours No results found for this basename: CKTOTAL:4,CKMB:4,TROPONINI:4 in the last 72 hours No components found with this basename: POCBNP:3 No results found for this basename: DDIMER in the last 72 hours No results found for this basename: HGBA1C in the last 72 hours No results found for this basename: CHOL,HDL,LDLCALC,TRIG,CHOLHDL in the last 72 hours No results found for this basename: TSH,T4TOTAL,FREET3,T3FREE,THYROIDAB in the last 72 hours No results found for this basename:  VITAMINB12,FOLATE,FERRITIN,TIBC,IRON,RETICCTPCT in the last 72 hours  Medications: Scheduled    . bisacodyl  10 mg Oral Daily  . calcium acetate  2,668 mg Oral TID WC  . cinacalcet  180 mg Oral Q supper  . darbepoetin (ARANESP) injection - DIALYSIS  100 mcg Intravenous Q Fri-HD  . doxercalciferol  4.5 mcg Intravenous Q M,W,F-HD  . enoxaparin  30 mg Subcutaneous Q24H  . fentaNYL   Intravenous Q4H  . metoprolol tartrate  25 mg Oral BID  . multivitamin  1 tablet Oral QHS  . ondansetron (ZOFRAN) IV  4 mg Intravenous Q6H  . sodium chloride  10 mL Intravenous Q12H     Radiology/Studies:  No results found.  INR: Will add last result for INR, ABG once components are confirmed Will add last 4 CBG results once components are confirmed  Assessment/Plan: S/P Procedure(s) (LRB): VIDEO ASSISTED THORACOSCOPY (Right) DRAINAGE OF PLEURAL EFFUSION (Right)  stable For dialysis today Could poss remove posterior tube Obtain cxr today   LOS: 18 days    GOLD,WAYNE E 1/20/20147:46 AM    He still has an air leak although I do think it's a little better. The majority of the leak seems to be coming from the posterior(diaphragmatic) tube.  I discussed our options with him  1. Continue waiting in hospital  2. Change to mini-express and dc home with both tubes- to be removed as outpatient  3. Bronchial valves  4. Relook VATS  After discussing the  relative merits and disadvantages of each approach, he wants to go home with the tubes. Will change to mini-express tonight Home tomorrow with tubes if CXR OK in AM

## 2012-12-29 NOTE — Progress Notes (Signed)
[facility]  Subjective:  No acute events. Pain being controled by oral meds. No shortness of breath, no chest pain, no nausea or vomiting.    Objective: Vital signs in last 24 hours: Blood pressure 126/64, pulse 60, temperature 98.2 F (36.8 C), temperature source Oral, resp. rate 18, height 6\' 1"  (1.854 m), weight 226 lb 1.6 oz (102.558 kg), SpO2 98.00%.    PHYSICAL EXAM Gen: NAD Chest: diminished breath sounds on right. No crackles, chest tube in place Cardio: regular rate and rhythm, S1, S2 normal  GI: soft, non-tender; bowel sounds normal; no masses, no organomegaly  Extremities: 2+ ankle edema  Neurologic: No focal deficit  Lab Results:   Lab 12/26/12 0616 12/24/12 0355  NA 135 135  K 4.5 4.4  CL 96 96  CO2 26 28  BUN 39* 40*  CREATININE 13.04* 13.22*  ALB -- --  GLUCOSE 87 --  CALCIUM 9.4 8.9  PHOS 6.0* --    No results found for this basename: WBC:2,HGB:2,HCT:2,PLT:2 in the last 72 hours   Scheduled Meds:   . bisacodyl  10 mg Oral Daily  . calcium acetate  2,668 mg Oral TID WC  . cinacalcet  180 mg Oral Q supper  . darbepoetin (ARANESP) injection - DIALYSIS  100 mcg Intravenous Q Fri-HD  . doxercalciferol  4.5 mcg Intravenous Q M,W,F-HD  . enoxaparin  30 mg Subcutaneous Q24H  . fentaNYL   Intravenous Q4H  . metoprolol tartrate  25 mg Oral BID  . multivitamin  1 tablet Oral QHS  . ondansetron (ZOFRAN) IV  4 mg Intravenous Q6H  . sodium chloride  10 mL Intravenous Q12H   Continuous Infusions:   . dextrose 5 % and 0.45% NaCl 20 mL/hr at 12/25/12 1500   PRN Meds:.sodium chloride, sodium chloride, calcium acetate, diphenhydrAMINE, diphenhydrAMINE, feeding supplement (NEPRO CARB STEADY), heparin, hydrALAZINE, lidocaine, lidocaine-prilocaine, naloxone, ondansetron (ZOFRAN) IV, oxyCODONE-acetaminophen, pentafluoroprop-tetrafluoroeth, potassium chloride, promethazine, senna-docusate, sodium chloride  Assessment/Plan: 1. S/P right chest VATS  for evacuation of pleural effusion and post-op pneumothorax- with Chest tubes- management per CTS.  2. ESRD: will continue HD on MWF with HD planned for today- AM labs pending 3. 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 AM labs pending 5. Nutrition: On renal diet/ renavite 6. Hypertension: Well controlled on metoprolol     LOS: 18 days   Liam Graham, PGY-2 Family Medicine Resident Pt seen on HD.  Ap 150  Vp 210.  SBP 118.

## 2012-12-30 ENCOUNTER — Inpatient Hospital Stay (HOSPITAL_COMMUNITY): Payer: Medicare Other

## 2012-12-30 MED ORDER — OXYCODONE-ACETAMINOPHEN 5-325 MG PO TABS: 1.0000 | ORAL_TABLET | ORAL | Status: DC | PRN

## 2012-12-30 MED ORDER — DOXERCALCIFEROL 4 MCG/2ML IV SOLN: 4.5000 ug | INTRAVENOUS | Status: DC

## 2012-12-30 MED ORDER — METOPROLOL TARTRATE 25 MG PO TABS: 25.0000 mg | ORAL_TABLET | Freq: Two times a day (BID) | ORAL | Status: DC

## 2012-12-30 MED ORDER — RENA-VITE PO TABS: 1.0000 | ORAL_TABLET | Freq: Every day | ORAL | Status: DC

## 2012-12-30 MED ORDER — DARBEPOETIN ALFA-POLYSORBATE 100 MCG/0.5ML IJ SOLN: 100.0000 ug | INTRAMUSCULAR | Status: DC

## 2012-12-30 NOTE — Progress Notes (Signed)
Pt chest tube switched over to mini express without any complications. Rapid response nurse assisted RN. Pt tolerated well, will continue to monitor.

## 2012-12-30 NOTE — Progress Notes (Signed)
12/30/2012 12:58 PM Nursing note Discharge avs form, rx, medications already taken today and those due this evening given and explained to patient and family. Follow up appointments, when to call MD and home health information reviewed with patient. RN from [facility] at bedside and able to assess CT sites prior to discharge. Dressings around CTS sites changed and secured prior to discharge home. CT care reviewed. Pt. Given Vaseline gauze and emergency care for CT reviewed. Questions and concerns addressed. Central line d/c earlier in shift per protocol. Ends intact. Pt. Tolerated well. D/c tele. D/c home with wife per orders. Pt. Chose to walk out of hospital, wheelchair assistance offered but pt. Refused.  Elyn Krogh, Arville Lime

## 2012-12-30 NOTE — Discharge Summary (Signed)
[address]            ,[place] [address]          [phone]      Douglas F Mintzer [birth year] 52 y.o. [id number]  12/11/2012   Melrose Nakayama, MD  GASTROINTESTINAL BLEED RENAL FAILURE Hematochezia gi bleeding Right pleural effusion  PCCM RESIDENT ADMISSION NOTE  History of Present Illness: [name] is a 52 y.o. African American male with a PMHx of ESRD on HD since 2007 (MWF - last session was on 12/08/2012 at [facility]), malignant HTN, polysubstance abuse (cocaine, marijuana, and alcohol), and chronic right sided pleural effusion who is admitted to [facility] on 12/11/2012 after transfer from [facility] with complaints of bright red blood per rectum x 8 episodes that started on the evening prior to admission. [name] claims he felt like he had to pass gas around 8pm on 12/09/12 but when he went to the bathroom he ended up passing large bright red blood clots. He subsequently continued to have bloody BMs every 15 minutes which led him to go to the hospital. He denies any similar episodes, and has never had a colonoscopy. He does not regularly follow up with a PCP. He does regularly smoke marijuana and cigarettes and occasionally drinks alcohol but denies any other illicit drug use. He denies regular NSAID use and does not know of any family history of colon cancer.  [name] was admitted to [facility] for acute onset painless lower GI bleed. He is noted to have several episodes of bleeding this AM while at [facility] with a Hb drop from 9.6 on admission to 6.5 and noted to be tachycardic. He was evaluated by GI--Dr. Lucky Cowboy during his stay there and had a bleeding scan which showed active GI hemorrhage primarily in the distal transverse colon and flow. Dr Lucky Cowboy planned on GI embolization today which was ultimately refused by [name] who also requested transfer to [facility] and wanting further evaluation.  Of note,  [name] last HD session was Monday, 12/08/12. He was supposed to get HD today but was not initiated. He normally follows with Dr. Holley Raring from Nephrology. He continued to have BRBPR with several episodes of bleeding reported this morning - last episode approximately 5PM, and was noted to be mildly tachycardic and with a progressively reduction in Hemoglobin to 6.5 this AM for which he was treated with 1 unit pRBC. He was transferred to [facility] for additional GI interventions.   Past Medical History   Diagnosis  Date   .  End-stage renal disease on hemodialysis    .  Secondary hyperparathyroidism    .  Hypertension    .  Polysubstance abuse    .  Anemia of chronic disease      BL Hgb ~10   .  Tobacco abuse     Past Surgical History   Procedure  Date   .  Surgery for horseshoe kidney     Family History   Problem  Relation  Age of Onset   .  Thyroid disease  Mother    .  Renal Disease      Social History: reports that he has been smoking Cigarettes. He has a 10 pack-year smoking history. He does not have any smokeless tobacco history on file. He reports that he drinks alcohol. He  reports that he uses illicit drugs.  Allergies: No Known Allergies  Medications:  Prior to Admission:  Prescriptions prior to admission   Medication  Sig  Dispense  Refill   .  amLODipine (NORVASC) 10 MG tablet  Take 10 mg by mouth daily.     .  calcium acetate (PHOSLO) 667 MG capsule  Take 2,668 mg by mouth 3 (three) times daily with meals.     .  calcium acetate (PHOSLO) 667 MG capsule  Take 2,001 mg by mouth with snacks.     .  cinacalcet (SENSIPAR) 90 MG tablet  Take 180 mg by mouth daily.     .  hydrochlorothiazide (HYDRODIURIL) 25 MG tablet  Take 25 mg by mouth daily.        Hospital Course:  The patient was admitted for further evaluation and treatment of his lower GI bleed. Gastroenterology consultation was obtained. He did require transfusion. He underwent colonoscopy on 12/16/2012. He was found to  have significant diverticulum as well as one sessile colon polyp which pathology revealed a tubular adenoma without evidence of high-grade dysplasia.. Nephrology consultation was obtained to manage his dialysis while in hospital. Additionally the patient was noted to have a chronic right-sided pleural effusion. He underwent thoracentesis on 12/12/2012 by Dr. Alva Garnet. Subsequent to this a cardiothoracic surgical consultation was obtained with Modesto Charon M.D. plans were made for video-assisted thoracoscopy which was done on 12/18/2012.  OPERATIVE REPORT  PREOPERATIVE DIAGNOSIS: Loculated right pleural effusion.  POSTOPERATIVE DIAGNOSIS: Loculated right pleural effusion.  PROCEDURE: Right video-assisted thoracoscopy, drainage of pleural  effusion, decortication of right lung.  SURGEON: Revonda Standard. Roxan Hockey, MD  ASSISTANT: John Giovanni, PA-C  SECOND ASSISTANT: Suzzanne Cloud, PA  ANESTHESIA: General.  FINDINGS: 3.5 L of bloody pleural fluid. Markedly thickened and  fibrotic parietal pleura. Frozen section showed inflammatory changes,  no evidence of malignancy. Lung completely encased in a thick fibrous  peel. Unable to get lung to completely re-expand. Multiple air leaks  after decortication.  Postoperative hospital course: The patient was extubated without difficulty. The patient is renal failure has continuously been monitored and dialysis has been managed by the nephrologist. He has progressed nicely in his overall recovery, but continues to have a persistent air leak. Chest x-ray has been stable with right sided  space persisting. He has been monitored quite closely but is not felt to be safe at this time for removal the tubes and a mini express system is now in placeOverall he is felt to be stable for discharge.  Basename 12/29/12 1321  NA 140  K 4.4  CL 100  CO2 28  GLUCOSE 116*  BUN 47*  CALCIUM 9.6    Basename 12/29/12 1321  WBC 5.2  HGB 8.6*  HCT 27.4*  PLT 176   No  results found for this basename: INR:2 in the last 72 hours   Discharge Instructions:  The patient is discharged to home with extensive instructions on wound care and progressive ambulation.  They are instructed not to drive or perform any heavy lifting until returning to see the physician in his office.  Discharge Diagnosis:  GASTROINTESTINAL BLEED RENAL FAILURE Hematochezia gi bleeding Right pleural effusion Persistent air leak right chest Secondary Diagnosis: Patient Active Problem List  Diagnosis  . End-stage renal disease on hemodialysis  . Secondary hyperparathyroidism  . Hypertension  . Polysubstance abuse  . Anemia of chronic disease  . Tobacco abuse  . Hyperkalemia  . Acute blood loss anemia  .  Sinus tachycardia, resolved with PRBC resuscitation  . Lower GI bleed  . ESRD (end stage renal disease)  . H/O: hypertension  . Pleural effusion, right  . Benign neoplasm of colon  . Diverticulosis of colon (without mention of hemorrhage)   Past Medical History  Diagnosis Date  . End-stage renal disease on hemodialysis   . Secondary hyperparathyroidism   . Hypertension   . Polysubstance abuse   . Anemia of chronic disease     BL Hgb ~10  . Tobacco abuse        Arshaan, [name]  Home Medication Instructions [id number]   Printed on:12/30/12 0905  Medication Information                    cinacalcet (SENSIPAR) 90 MG tablet Take 180 mg by mouth daily.           calcium acetate (PHOSLO) 667 MG capsule Take 2,668 mg by mouth 3 (three) times daily with meals.            calcium acetate (PHOSLO) 667 MG capsule Take 2,001 mg by mouth with snacks.           doxercalciferol (HECTOROL) 4 MCG/2ML injection Inject 2.25 mLs (4.5 mcg total) into the vein every Monday, Wednesday, and Friday with hemodialysis.           darbepoetin (ARANESP) 100 MCG/0.5ML SOLN Inject 0.5 mLs (100 mcg total) into the vein every Friday with hemodialysis.           metoprolol tartrate  (LOPRESSOR) 25 MG tablet Take 1 tablet (25 mg total) by mouth 2 (two) times daily.           multivitamin (RENA-VIT) TABS tablet Take 1 tablet by mouth at bedtime.           oxyCODONE-acetaminophen (PERCOCET/ROXICET) 5-325 MG per tablet Take 1-2 tablets by mouth every 4 (four) hours as needed.            Follow-up Information    Follow up with HENDRICKSON,STEVEN C, MD. (1 week, the office will contact you with time and date. Also obtain a chest x-ray a [facility] 1 hour prior to appointment with surgeon. [place] [facility] is located in the same office complex.)    Contact information:   [address] [place] [address] [phone]         Disposition: For discharge home  Patient's condition is Kathyrn Drown, PA-C 12/30/2012  9:05 AM

## 2012-12-30 NOTE — Progress Notes (Signed)
S: Feels well, wants to go home O:BP 120/72  Pulse 67  Temp 98.3 F (36.8 C) (Oral)  Resp 18  Ht 6\' 1"  (1.854 m)  Wt 99.8 kg (220 lb 0.3 oz)  BMI 29.03 kg/m2  SpO2 99%  Intake/Output Summary (Last 24 hours) at 12/30/12 0738 Last data filed at 12/29/12 1709  Gross per 24 hour  Intake      0 ml  Output   3026 ml  Net  -3026 ml   Weight change: 0.542 kg (1 lb 3.1 oz) [id number] and alert CVS:RRR 1/6 systolic murmur Resp:decreased BS Rt base Abd:+ BS NTND Ext:no edema.  + bruit Rt UA AVG NEURO:CNI, Ox3 no asterixis      . bisacodyl  10 mg Oral Daily  . calcium acetate  2,668 mg Oral TID WC  . cinacalcet  180 mg Oral Q supper  . darbepoetin (ARANESP) injection - DIALYSIS  100 mcg Intravenous Q Fri-HD  . doxercalciferol  4.5 mcg Intravenous Q M,W,F-HD  . enoxaparin  30 mg Subcutaneous Q24H  . metoprolol tartrate  25 mg Oral BID  . multivitamin  1 tablet Oral QHS  . sodium chloride  10 mL Intravenous Q12H  . sodium chloride  10-40 mL Intracatheter Q12H   Dg Chest Port 1 View  12/29/2012  *RADIOLOGY REPORT*  Clinical Data: Post VATS.  PORTABLE CHEST - 1 VIEW  Comparison: 12/27/2012  Findings: Right chest tubes remain in place, unchanged.  Moderate sized right pneumothorax.  Continued opacity, likely atelectasis in the right mid lung.  Heart is mildly enlarged.  Left central line is unchanged.  No focal opacity on the left.  IMPRESSION: Stable exam.   Original Report Authenticated By: Rolm Baptise, M.D.    BMET    Component Value Date/Time   NA 140 12/29/2012 1321   K 4.4 12/29/2012 1321   CL 100 12/29/2012 1321   CO2 28 12/29/2012 1321   GLUCOSE 116* 12/29/2012 1321   BUN 47* 12/29/2012 1321   CREATININE 14.98* 12/29/2012 1321   CALCIUM 9.6 12/29/2012 1321   GFRNONAA 3* 12/29/2012 1321   GFRAA 4* 12/29/2012 1321   CBC    Component Value Date/Time   WBC 5.2 12/29/2012 1321   RBC 2.95* 12/29/2012 1321   HGB 8.6* 12/29/2012 1321   HCT 27.4* 12/29/2012 1321   PLT 176 12/29/2012  1321   MCV 92.9 12/29/2012 1321   MCH 29.2 12/29/2012 1321   MCHC 31.4 12/29/2012 1321   RDW 15.3 12/29/2012 1321   LYMPHSABS 1.3 12/12/2012 1205   MONOABS 0.5 12/12/2012 1205   EOSABS 0.1 12/12/2012 1205   BASOSABS 0.0 12/12/2012 1205     Assessment: 1. GI bleed, stable 2. Sp VATS with pneumothorax 3. Anemia 4 Sec HPTH 5. ESRD Plan: 1.  He says he was told he could go home with CT in place to have removed as outpt. 2.  Will plan HD in AM if he is still here   Finnleigh Marchetti T

## 2012-12-30 NOTE — Progress Notes (Addendum)
[address]            [place] [address]          [phone]     12 Days Post-Op  Procedure(s) (LRB): VIDEO ASSISTED THORACOSCOPY (Right) DRAINAGE OF PLEURAL EFFUSION (Right) Subjective: He continues to feel well. Chest tube continues to have a large air leak. The many expresses in place.  Objective  Telemetry sinus rhythm with PVCs  Temp:  [97 F (36.1 C)-98.3 F (36.8 C)] 98.3 F (36.8 C) (01/21 0533) Pulse Rate:  [64-92] 67  (01/21 0533) Resp:  [15-20] 18  (01/21 0533) BP: (108-139)/(59-78) 120/72 mmHg (01/21 0533) SpO2:  [99 %-100 %] 99 % (01/21 0533) Weight:  [220 lb 0.3 oz (99.8 kg)-227 lb 4.7 oz (103.1 kg)] 220 lb 0.3 oz (99.8 kg) (01/21 0533)   Intake/Output Summary (Last 24 hours) at 12/30/12 0838 Last data filed at 12/29/12 1709  Gross per 24 hour  Intake      0 ml  Output   3026 ml  Net  -3026 ml       General appearance: alert, cooperative and no distress Heart: regular rate and rhythm Lungs: Mildly diminished in the bases Abdomen: Benign exam Extremities: No edema Wound: Dressings clean dry and intact  Lab Results:  Basename 12/29/12 1321  NA 140  K 4.4  CL 100  CO2 28  GLUCOSE 116*  BUN 47*  CREATININE 14.98*  CALCIUM 9.6  MG --  PHOS 5.2*    Basename 12/29/12 1321  AST --  ALT --  ALKPHOS --  BILITOT --  PROT --  ALBUMIN 2.5*   No results found for this basename: LIPASE:2,AMYLASE:2 in the last 72 hours  Basename 12/29/12 1321  WBC 5.2  NEUTROABS --  HGB 8.6*  HCT 27.4*  MCV 92.9  PLT 176   No results found for this basename: CKTOTAL:4,CKMB:4,TROPONINI:4 in the last 72 hours No components found with this basename: POCBNP:3 No results found for this basename: DDIMER in the last 72 hours No results found for this basename: HGBA1C in the last 72 hours No results found for this basename: CHOL,HDL,LDLCALC,TRIG,CHOLHDL in the last 72 hours No results found for this basename:  TSH,T4TOTAL,FREET3,T3FREE,THYROIDAB in the last 72 hours No results found for this basename: VITAMINB12,FOLATE,FERRITIN,TIBC,IRON,RETICCTPCT in the last 72 hours  Medications: Scheduled    . bisacodyl  10 mg Oral Daily  . calcium acetate  2,668 mg Oral TID WC  . cinacalcet  180 mg Oral Q supper  . darbepoetin (ARANESP) injection - DIALYSIS  100 mcg Intravenous Q Fri-HD  . doxercalciferol  4.5 mcg Intravenous Q M,W,F-HD  . enoxaparin  30 mg Subcutaneous Q24H  . metoprolol tartrate  25 mg Oral BID  . multivitamin  1 tablet Oral QHS  . sodium chloride  10 mL Intravenous Q12H  . sodium chloride  10-40 mL Intracatheter Q12H     Radiology/Studies:  Dg Chest 2 View  12/30/2012  *RADIOLOGY REPORT*  Clinical Data: Evaluate pneumothorax  CHEST - 2 VIEW  Comparison: 12/29/2012; 12/27/2012; 12/24/2012  Findings:  Grossly unchanged cardiac silhouette and mediastinal contours. Stable positioning of support apparatus.  Suspected minimal increase in the amount of fluid within the partially loculated right-sided moderate to large hydropneumothorax with basilar component.  Grossly unchanged minimal left basilar/retrocardiac opacity favored to represent atelectasis. No new focal airspace opacities.  Minimal improved aeration of the  right perihilar lung. Unchanged bones.  IMPRESSION:  1.  Stable positioning of support apparatus.  Minimal increase in the amount of fluid within the moderate to large right sided hydropneumothorax.  2.  Minimal improved aeration in the right perihilar lung.   Original Report Authenticated By: Jake Seats, MD    Dg Chest Port 1 View  12/29/2012  *RADIOLOGY REPORT*  Clinical Data: Post VATS.  PORTABLE CHEST - 1 VIEW  Comparison: 12/27/2012  Findings: Right chest tubes remain in place, unchanged.  Moderate sized right pneumothorax.  Continued opacity, likely atelectasis in the right mid lung.  Heart is mildly enlarged.  Left central line is unchanged.  No focal opacity on the left.   IMPRESSION: Stable exam.   Original Report Authenticated By: Rolm Baptise, M.D.     INR: Will add last result for INR, ABG once components are confirmed Will add last 4 CBG results once components are confirmed  Assessment/Plan: S/P Procedure(s) (LRB): VIDEO ASSISTED THORACOSCOPY (Right) DRAINAGE OF PLEURAL EFFUSION (Right) Appears stable for discharge with many expresses and tubes in place. We'll make arrangements for nurse to intermittently checked on mini express and empty as required. We'll see in office in one week with a chest x-ray    LOS: 19 days    GOLD,WAYNE E 1/21/20148:38 AM    Patient seen and examined. Agree with above.

## 2012-12-30 NOTE — Progress Notes (Signed)
Home care to be provided by [facility] [phone]

## 2012-12-30 NOTE — Progress Notes (Signed)
Pt called RN to room to show bowel movement in the toilet, showings of minimal blood in formed stool. Hat will be placed in toilet to get sample of next stool. Will continue to monitor.

## 2012-12-31 NOTE — Care Management Note (Signed)
    Page 1 of 1   12/31/2012     1:28:22 PM   CARE MANAGEMENT NOTE 12/31/2012  Patient:  [name] [name], [name] [name]   Account Number:  [id number]  Date Initiated:  12/16/2012  Documentation initiated by:  Lizabeth Leyden  Subjective/Objective Assessment:   Patient admitted with GI bleed     Action/Plan:   Progression of care and discharge planning   Anticipated DC Date:  12/30/2012   Anticipated DC Plan:  [place]  CM consult      [facility] Choice  HOME HEALTH   Choice offered to / List presented to:  C-1 Patient        [place] arranged  HH-1 RN      [place]   Status of service:  Completed, signed off Medicare Important Message given?   (If response is "NO", the following Medicare IM given date fields will be blank) Date Medicare IM given:   Date Additional Medicare IM given:    Discharge Disposition:  [place]  Per UR Regulation:  Reviewed for med. necessity/level of care/duration of stay  If discussed at [place] of Stay Meetings, dates discussed:    Comments:  12/30/12 Raenah Murley,RN,BSN [id number] PT [place].  WILL NEED HHRN TO FOLLOW UP.  MET WITH PT TO DISCUSS ARRANGEMENTS.  REFERRAL TO [facility] WITH [facility], PER PT CHOICE.  START OF CARE 24-48H POST DC DATE.

## 2013-01-05 ENCOUNTER — Emergency Department: Payer: Self-pay | Admitting: Emergency Medicine

## 2013-01-05 ENCOUNTER — Other Ambulatory Visit: Payer: Self-pay | Admitting: *Deleted

## 2013-01-05 DIAGNOSIS — J90 Pleural effusion, not elsewhere classified: Secondary | ICD-10-CM

## 2013-01-05 LAB — CBC
HCT: 31.6 % — ABNORMAL LOW (ref 40.0–52.0)
MCH: 29.4 pg (ref 26.0–34.0)
MCHC: 31.8 g/dL — ABNORMAL LOW (ref 32.0–36.0)
Platelet: 207 10*3/uL (ref 150–440)
RBC: 3.42 10*6/uL — ABNORMAL LOW (ref 4.40–5.90)
RDW: 16.9 % — ABNORMAL HIGH (ref 11.5–14.5)
WBC: 14.3 10*3/uL — ABNORMAL HIGH (ref 3.8–10.6)

## 2013-01-05 LAB — COMPREHENSIVE METABOLIC PANEL
Albumin: 2.6 g/dL — ABNORMAL LOW (ref 3.4–5.0)
Bilirubin,Total: 0.4 mg/dL (ref 0.2–1.0)
Chloride: 100 mmol/L (ref 98–107)
Creatinine: 10.69 mg/dL — ABNORMAL HIGH (ref 0.60–1.30)
EGFR (Non-African Amer.): 5 — ABNORMAL LOW
SGPT (ALT): 16 U/L (ref 12–78)
Sodium: 138 mmol/L (ref 136–145)

## 2013-01-05 LAB — PROTIME-INR: Prothrombin Time: 16.1 secs — ABNORMAL HIGH (ref 11.5–14.7)

## 2013-01-06 ENCOUNTER — Emergency Department (HOSPITAL_COMMUNITY): Payer: Medicare Other

## 2013-01-06 ENCOUNTER — Inpatient Hospital Stay (HOSPITAL_COMMUNITY)
Admission: EM | Admit: 2013-01-06 | Discharge: 2013-01-20 | DRG: 871 | Disposition: A | Discharge status: Home / Self Care | Payer: Medicare Other | Attending: Thoracic Surgery (Cardiothoracic Vascular Surgery) | Admitting: Thoracic Surgery (Cardiothoracic Vascular Surgery)

## 2013-01-06 ENCOUNTER — Encounter (HOSPITAL_COMMUNITY): Payer: Self-pay | Admitting: Emergency Medicine

## 2013-01-06 DIAGNOSIS — R7881 Bacteremia: Secondary | ICD-10-CM | POA: Diagnosis present

## 2013-01-06 DIAGNOSIS — N2581 Secondary hyperparathyroidism of renal origin: Secondary | ICD-10-CM | POA: Diagnosis present

## 2013-01-06 DIAGNOSIS — I82C19 Acute embolism and thrombosis of unspecified internal jugular vein: Secondary | ICD-10-CM | POA: Diagnosis present

## 2013-01-06 DIAGNOSIS — I120 Hypertensive chronic kidney disease with stage 5 chronic kidney disease or end stage renal disease: Secondary | ICD-10-CM | POA: Diagnosis present

## 2013-01-06 DIAGNOSIS — I959 Hypotension, unspecified: Secondary | ICD-10-CM

## 2013-01-06 DIAGNOSIS — I776 Arteritis, unspecified: Secondary | ICD-10-CM | POA: Diagnosis present

## 2013-01-06 DIAGNOSIS — D62 Acute posthemorrhagic anemia: Secondary | ICD-10-CM | POA: Diagnosis present

## 2013-01-06 DIAGNOSIS — A419 Sepsis, unspecified organism: Principal | ICD-10-CM | POA: Diagnosis present

## 2013-01-06 DIAGNOSIS — N186 End stage renal disease: Secondary | ICD-10-CM | POA: Diagnosis present

## 2013-01-06 DIAGNOSIS — A4901 Methicillin susceptible Staphylococcus aureus infection, unspecified site: Secondary | ICD-10-CM | POA: Diagnosis present

## 2013-01-06 DIAGNOSIS — K5900 Constipation, unspecified: Secondary | ICD-10-CM | POA: Diagnosis present

## 2013-01-06 DIAGNOSIS — I10 Essential (primary) hypertension: Secondary | ICD-10-CM

## 2013-01-06 DIAGNOSIS — R509 Fever, unspecified: Secondary | ICD-10-CM

## 2013-01-06 DIAGNOSIS — F1910 Other psychoactive substance abuse, uncomplicated: Secondary | ICD-10-CM

## 2013-01-06 DIAGNOSIS — J90 Pleural effusion, not elsewhere classified: Secondary | ICD-10-CM | POA: Diagnosis present

## 2013-01-06 DIAGNOSIS — E875 Hyperkalemia: Secondary | ICD-10-CM | POA: Diagnosis present

## 2013-01-06 DIAGNOSIS — F17200 Nicotine dependence, unspecified, uncomplicated: Secondary | ICD-10-CM | POA: Diagnosis present

## 2013-01-06 DIAGNOSIS — Z992 Dependence on renal dialysis: Secondary | ICD-10-CM

## 2013-01-06 DIAGNOSIS — Z9889 Other specified postprocedural states: Secondary | ICD-10-CM

## 2013-01-06 DIAGNOSIS — D638 Anemia in other chronic diseases classified elsewhere: Secondary | ICD-10-CM | POA: Diagnosis present

## 2013-01-06 LAB — CBC WITH DIFFERENTIAL/PLATELET
Basophils Relative: 0 % (ref 0–1)
Eosinophils Absolute: 0 10*3/uL (ref 0.0–0.7)
Eosinophils Absolute: 0 10*3/uL (ref 0.0–0.7)
Eosinophils Relative: 0 % (ref 0–5)
Eosinophils Relative: 0 % (ref 0–5)
Lymphs Abs: 1.4 10*3/uL (ref 0.7–4.0)
Lymphs Abs: 1.8 10*3/uL (ref 0.7–4.0)
MCH: 29.4 pg (ref 26.0–34.0)
MCH: 28.9 pg (ref 26.0–34.0)
MCHC: 32 g/dL (ref 30.0–36.0)
MCHC: 31.3 g/dL (ref 30.0–36.0)
MCV: 91.9 fL (ref 78.0–100.0)
MCV: 92.4 fL (ref 78.0–100.0)
Monocytes Absolute: 1.9 10*3/uL — ABNORMAL HIGH (ref 0.1–1.0)
Monocytes Relative: 11 % (ref 3–12)
Neutrophils Relative %: 76 % (ref 43–77)
Platelets: 176 10*3/uL (ref 150–400)
Platelets: 180 10*3/uL (ref 150–400)
RBC: 2.96 MIL/uL — ABNORMAL LOW (ref 4.22–5.81)
RBC: 3.04 MIL/uL — ABNORMAL LOW (ref 4.22–5.81)

## 2013-01-06 LAB — COMPREHENSIVE METABOLIC PANEL
AST: 12 U/L (ref 0–37)
Albumin: 2.2 g/dL — ABNORMAL LOW (ref 3.5–5.2)
BUN: 63 mg/dL — ABNORMAL HIGH (ref 6–23)
CO2: 27 mEq/L (ref 19–32)
Calcium: 10.3 mg/dL (ref 8.4–10.5)
Calcium: 10.2 mg/dL (ref 8.4–10.5)
Creatinine, Ser: 14.14 mg/dL — ABNORMAL HIGH (ref 0.50–1.35)
GFR calc Af Amer: 4 mL/min — ABNORMAL LOW (ref >90)
GFR calc Af Amer: 4 mL/min — ABNORMAL LOW (ref >90)
GFR calc non Af Amer: 4 mL/min — ABNORMAL LOW (ref >90)
Glucose, Bld: 90 mg/dL (ref 70–99)
Glucose, Bld: 104 mg/dL — ABNORMAL HIGH (ref 70–99)
Sodium: 135 mEq/L (ref 135–145)
Total Protein: 7.1 g/dL (ref 6.0–8.3)
Total Protein: 7.2 g/dL (ref 6.0–8.3)

## 2013-01-06 LAB — GLUCOSE, CAPILLARY: Glucose-Capillary: 100 mg/dL — ABNORMAL HIGH (ref 70–99)

## 2013-01-06 MED ORDER — OXYCODONE-ACETAMINOPHEN 5-325 MG PO TABS
1.0000 | ORAL_TABLET | ORAL | Status: DC | PRN
  Administered 2013-01-07 – 2013-01-20 (×53): 2 via ORAL
  Filled 2013-01-06 (×37): qty 2
  Filled 2013-01-06: qty 1
  Filled 2013-01-06 (×15): qty 2

## 2013-01-06 MED ORDER — RENA-VITE PO TABS
1.0000 | ORAL_TABLET | Freq: Every day | ORAL | Status: DC
  Administered 2013-01-07 – 2013-01-15 (×8): 1 via ORAL
  Administered 2013-01-16 – 2013-01-17 (×2): via ORAL
  Administered 2013-01-18 – 2013-01-19 (×2): 1 via ORAL
  Filled 2013-01-06 (×15): qty 1

## 2013-01-06 MED ORDER — VANCOMYCIN HCL IN DEXTROSE 1-5 GM/200ML-% IV SOLN
1000.0000 mg | Freq: Once | INTRAVENOUS | Status: AC
  Administered 2013-01-06: 1000 mg via INTRAVENOUS
  Filled 2013-01-06: qty 200

## 2013-01-06 MED ORDER — CINACALCET HCL 30 MG PO TABS
180.0000 mg | ORAL_TABLET | Freq: Every day | ORAL | Status: DC
  Administered 2013-01-07 – 2013-01-19 (×12): 180 mg via ORAL
  Filled 2013-01-06 (×15): qty 6

## 2013-01-06 MED ORDER — SODIUM POLYSTYRENE SULFONATE 15 GM/60ML PO SUSP
30.0000 g | Freq: Once | ORAL | Status: DC
  Filled 2013-01-06: qty 120

## 2013-01-06 MED ORDER — CALCIUM ACETATE 667 MG PO CAPS
2668.0000 mg | ORAL_CAPSULE | Freq: Three times a day (TID) | ORAL | Status: DC
  Administered 2013-01-07 – 2013-01-18 (×18): 2668 mg via ORAL
  Filled 2013-01-06 (×43): qty 4

## 2013-01-06 MED ORDER — DEXTROSE 5 % IV SOLN: 500.0000 mg | INTRAVENOUS | Status: DC

## 2013-01-06 MED ORDER — VANCOMYCIN HCL IN DEXTROSE 1-5 GM/200ML-% IV SOLN
1000.0000 mg | INTRAVENOUS | Status: DC
  Administered 2013-01-07 – 2013-01-09 (×3): 1000 mg via INTRAVENOUS
  Filled 2013-01-06 (×5): qty 200

## 2013-01-06 MED ORDER — ACETAMINOPHEN 325 MG PO TABS
650.0000 mg | ORAL_TABLET | Freq: Four times a day (QID) | ORAL | Status: DC | PRN
  Administered 2013-01-06 – 2013-01-07 (×2): 650 mg via ORAL
  Filled 2013-01-06 (×3): qty 2

## 2013-01-06 MED ORDER — CALCIUM ACETATE 667 MG PO CAPS
2001.0000 mg | ORAL_CAPSULE | ORAL | Status: DC
  Administered 2013-01-07 – 2013-01-17 (×5): 2001 mg via ORAL
  Filled 2013-01-06 (×42): qty 3

## 2013-01-06 MED ORDER — DEXTROSE 5 % IV SOLN
2.0000 g | Freq: Once | INTRAVENOUS | Status: AC
  Administered 2013-01-06: 2 g via INTRAVENOUS
  Filled 2013-01-06: qty 2

## 2013-01-06 MED ORDER — SODIUM POLYSTYRENE SULFONATE 15 GM/60ML PO SUSP
45.0000 g | Freq: Once | ORAL | Status: AC
  Administered 2013-01-06: 45 g via ORAL
  Filled 2013-01-06: qty 180

## 2013-01-06 MED ORDER — DOXERCALCIFEROL 4 MCG/2ML IV SOLN
4.5000 ug | INTRAVENOUS | Status: DC
  Filled 2013-01-06: qty 4

## 2013-01-06 MED ORDER — HEPARIN SODIUM (PORCINE) 5000 UNIT/ML IJ SOLN
5000.0000 [IU] | Freq: Three times a day (TID) | INTRAMUSCULAR | Status: DC
  Administered 2013-01-07 – 2013-01-11 (×12): 5000 [IU] via SUBCUTANEOUS
  Filled 2013-01-06 (×23): qty 1

## 2013-01-06 MED ORDER — SENNOSIDES-DOCUSATE SODIUM 8.6-50 MG PO TABS
1.0000 | ORAL_TABLET | Freq: Two times a day (BID) | ORAL | Status: DC
  Administered 2013-01-07 – 2013-01-20 (×20): 1 via ORAL
  Filled 2013-01-06 (×29): qty 1

## 2013-01-06 MED ORDER — HYDROMORPHONE HCL PF 1 MG/ML IJ SOLN
1.0000 mg | INTRAMUSCULAR | Status: DC | PRN
  Administered 2013-01-07: 1 mg via INTRAVENOUS
  Filled 2013-01-06: qty 1

## 2013-01-06 NOTE — ED Notes (Signed)
Pt has been draining serosanguinous fluid from chest tube. Pt has had less than 110mL output since arriving this morning. Pt's output is currently at the 200 mark.

## 2013-01-06 NOTE — ED Notes (Signed)
Lactic acid results reported by tech to be normal.

## 2013-01-06 NOTE — Progress Notes (Signed)
Hyperkalemia K 5.9   The patient received 45g Kayexalate. Since he had multiple loose stools.   We will repeat BMP at 12:00 am.

## 2013-01-06 NOTE — ED Notes (Signed)
Pt's monitor alarmed that pt's 02 dropped below 90 while pt was sleeping.  Placed pt on 2L [place], 02 is up to 100%.

## 2013-01-06 NOTE — ED Notes (Signed)
Results of lactic acid shown to [employer]

## 2013-01-06 NOTE — ED Notes (Addendum)
Internal medicine at bedside

## 2013-01-06 NOTE — H&P (Signed)
Date: 01/06/2013               Patient Name:  [name] [name] MRN: [id number]  DOB: [birth year] Age / Sex: 52 y.o., male   PCP: No primary provider on file.              Medical Service: Internal Medicine Teaching Service              Attending Physician: Dr. Murlean Caller    First Contact: Dr. Jeani Hawking Pager: [phone]  Second Contact: Dr. Vertell Novak Pager: [phone]            After Hours (After 5p/  First Contact Pager: [phone]  weekends / holidays): Second Contact Pager: [phone]     Chief Complaint: fever  History of Present Illness: Patient is a 52 y.o. male with a PMHx of ESRD on HD, right-sided pleural effusion, who presents to [facility] for evaluation of fever. The patient is on MWF hemodialysis schedule in [place], and had to discontinue his HD session 2 hours early yesterday secondary to fever/chills. He was sent to the [facility] ED, where he had blood cultures drawn and was prescribed antibiotics. He received a call this morning informing him that his blood cultures have been positive for bacteria ("septic blood"), and was told to go to the ED.   He complains of objective fever as measured during his hemodialysis session (~100F), but denies any recent subjective fevers/chills. He denies any recent shortness of breath or chest pain. On a previous admission for GI bleeding on 12/11/2012, he was noted to have a right pleural effusion for which he underwent VATS and chest tube placement on 12/19/2012 (previously underwent thoracentesis for R pleural effusion ~18 months ago at [facility]).   Today, the patient complains of pain around the area of the chest tubes and also some pain in the right upper extremity in the area of his HD access site. On arrival to the [facility] today, records were obtained from the patient's previous visit to the [facility] ED yesterday: patient has 1/2 blood cultures positive for gram-positive cocci in clusters per [facility] medical records (the other culture is  no growth to date after 18-24 hours). Patient was started empirically on vancomycin and cefepime in the ED after blood cultures were drawn.  Review of Systems: Per HPI.   Current Outpatient Medications: No current facility-administered medications on file prior to encounter.   Current Outpatient Prescriptions on File Prior to Encounter  Medication Sig Dispense Refill  . calcium acetate (PHOSLO) 667 MG capsule Take 2,668 mg by mouth 3 (three) times daily with meals.       . calcium acetate (PHOSLO) 667 MG capsule Take 2,001 mg by mouth with snacks.      . cinacalcet (SENSIPAR) 90 MG tablet Take 180 mg by mouth daily.      . metoprolol tartrate (LOPRESSOR) 25 MG tablet Take 25 mg by mouth 2 (two) times daily.         Allergies: No Known Allergies   Past Medical History: Past Medical History  Diagnosis Date  . End-stage renal disease on hemodialysis   . Secondary hyperparathyroidism   . Hypertension   . Polysubstance abuse   . Anemia of chronic disease     BL Hgb ~10  . Tobacco abuse     Past Surgical History: Past Surgical History  Procedure Date  . Surgery for horseshoe kidney   . Colonoscopy 12/16/2012    Procedure: COLONOSCOPY;  Surgeon:  Milus Banister, MD;  Location: [facility];  Service: Endoscopy;  Laterality: N/A;  . Video assisted thoracoscopy 12/18/2012    Procedure: VIDEO ASSISTED THORACOSCOPY;  Surgeon: Melrose Nakayama, MD;  Location: [place];  Service: Thoracic;  Laterality: Right;  pleural peel  . Pleural effusion drainage 12/18/2012    Procedure: DRAINAGE OF PLEURAL EFFUSION;  Surgeon: Melrose Nakayama, MD;  Location: [facility] OR;  Service: Thoracic;  Laterality: Right;    Family History: Family History  Problem Relation Age of Onset  . Thyroid disease Mother   . Renal Disease      Social History: History   Social History  . Marital Status: Single    Spouse Name: N/A    Number of Children: 2  . Years of Education: 12   Occupational History  .  disabled     previously drove trucks   Social History Main Topics  . Smoking status: Current Every Day Smoker -- 0.5 packs/day for 20 years    Types: Cigarettes  . Smokeless tobacco: Not on file  . Alcohol Use: Yes     Comment: occasional  . Drug Use: Yes     Comment: remote cocaine abuse, ongoing marijuana use.  Marland Kitchen Sexually Active: Not on file   Other Topics Concern  . Not on file   Social History Narrative  . No narrative on file     Vital Signs: Blood pressure 112/72, pulse 110, temperature 99.4 F (37.4 C), temperature source Oral, resp. rate 20, height 6\' 1"  (1.854 m), weight 220 lb 7.4 oz (100 kg), SpO2 100.00%.  Physical Exam: General: Vital signs reviewed and noted. In moderate distress secondary to pain.  Head: Normocephalic, atraumatic.  Eyes: PERRL, EOMI, No signs of anemia or jaundice.  Nose: Mucous membranes moist, not inflammed, nonerythematous.  Throat: Oropharynx nonerythematous, no exudate appreciated.   Neck: No deformities, masses, or tenderness noted.  Lungs/chest:  Normal respiratory effort. Two chest tubes in place, with patent lumens. Patient exquisitely tender to palpation in the area of his R-sided chest tubes.   Heart: Sinus tachycardia. S1 and S2 normal without gallop, murmur, or rubs.  Abdomen:  BS normoactive. Soft, Nondistended, non-tender.  Extremities: No pretibial edema. RUE HD access with bruit, thrill. TTP over RUE access site.   Neurologic: A&O X3, CN II - XII are grossly intact. Motor strength is 5/5 in the all 4 extremities, Sensations intact to light touch, Cerebellar signs negative.  Skin: No visible rashes, scars.   Lab results: Comprehensive Metabolic Panel:    Component Value Date/Time   NA 135 01/06/2013 1407   K 5.9* 01/06/2013 1407   CL 93* 01/06/2013 1407   CO2 26 01/06/2013 1407   BUN 63* 01/06/2013 1407   CREATININE 14.01* 01/06/2013 1407   GLUCOSE 104* 01/06/2013 1407   CALCIUM 10.2 01/06/2013 1407   AST 13 01/06/2013 1407    ALT 11 01/06/2013 1407   ALKPHOS 70 01/06/2013 1407   BILITOT 0.5 01/06/2013 1407   PROT 7.2 01/06/2013 1407   ALBUMIN 2.2* 01/06/2013 1407    CBC:    Component Value Date/Time   WBC 14.0* 01/06/2013 1407   HGB 8.8* 01/06/2013 1407   HCT 28.1* 01/06/2013 1407   PLT 180 01/06/2013 1407   MCV 92.4 01/06/2013 1407   NEUTROABS 10.7* 01/06/2013 1407   LYMPHSABS 1.8 01/06/2013 1407   MONOABS 1.6* 01/06/2013 1407   EOSABS 0.0 01/06/2013 1407   BASOSABS 0.0 01/06/2013 1407   Imaging results:  Dg  Chest 2 View  01/06/2013  *RADIOLOGY REPORT*  Clinical Data: Chest pain, chest tube  CHEST - 2 VIEW  Comparison: Prior chest x-ray 12/30/2012  Findings: Similar position of right-sided thoracostomy tubes.  The left IJ central venous catheter has been removed.  Unchanged appearance of residual pleural fluid versus pleural thickening and right apical pneumothorax.  Opacities in the right upper and mid lung are also similar to prior.  Stable mild cardiomegaly.  The left lung remains relatively well aerated.  IMPRESSION:  1.  Very similar appearance of the right lung compared to 12/30/2012.  Persistent right apical pneumothorax (possibly ex vacuo with stuck lung) and residual right pleural fluid versus pleural thickening. 2.  Stable right upper and mid lung opacities. 3.  Stable mild cardiomegaly.   Original Report Authenticated By: Jacqulynn Cadet, M.D.      Other results:  EKG (01/06/2013) - Normal Sinus Rhythm, regular rate of approximately 125 bpm, normal axis, ST segments: nonspecific ST changes.     Assessment & Plan:  Pt is a 52 y.o. yo male with a PMHx of ESRD on HD, R-sided pleural effusion, who was admitted on 01/06/2013 with symptoms of fever/bacteremia.   Sepsis - pt has 2/4 SIRS (tachycardia, leukocytosis) criteria with bacteremia (1/2) with gram-positive cocci in clusters. He also has a borderline fever (100.3). BP low but stable in the ED (116/70 most recently). No IV fluids have been given due to  patient having ESRD. CXR performed on admission was stable since previous, and did not reveal any new discrete infiltrates, large concern for pneumonia as a source of infection. The patient is tender to palpation of his RU extremity hemodialysis access site and also in the area of his right-sided chest tubes, both of which are concerning as possible sources of infection. Patient was started empirically on vancomycin and cefepime in the ED. - admit to stepdown on tele - cont vanc - cont cefepime - f/u blood culture results  ESRD on HD - K = 5.9. No signs or symptoms consistent with an urgent or emergent indication for hemodialysis at this time.  - HD per nephrology  R-sided pleural effusion - patient has no previous electronic medical records at [facility] prior to 2014, however per chart review from previous encounters this year it appears the patient first had a right pleural effusion approximately 18 months ago, with thoracentesis at [facility]. On 12/11/2012, he was admitted for GI bleed and was noted to have recurrence of his right pleural effusion. He underwent thoracentesis per CVTS, which revealed an exudative effusion, and subsequently underwent VATS, with the op note stating that the patient's right lung was encased in a thick fibrinous peel, requiring decortication. Patient was ultimately discharged on 12/30/2012 with chest tubes still in place. On admission today, the patient's chest tubes are still in place and CXR reveals a stable right apical pneumothorax that persists since his previous hospitalization. - consult CVTS  GI Bleed - pt presented to [place] with lower GI bleed on 12/11/2012 with Hb dropping from 9.6 -> 6.5 in the ED. Tagged RBC scan revealed hemorrhage in the distal transverse colon, however pt refused embolization and requested transfer to [facility], where he was admitted. The issue stabilized after admission, and colonoscopy revealed only nonbleeding left-sided diverticula and one  sessile polyp. On admission today, Hb is stable and appears at baseline, and pt has no evidence of active bleeding.  - daily CBCs   DVT PPX - heparin  CODE STATUS - full  CONSULTS PLACED - nephrology  DISPO - Disposition is deferred at this time, awaiting improvement of fever/bacteremia.   Anticipated discharge in approximately 2-3 day(s).   The patient does have a current PCP (No primary provider on file.) and will not be requiring OPC follow-up after discharge.   The patient does not have transportation limitations that hinder transportation to clinic appointments.   Services Needed at time of discharge: TO [place]         Y = Yes, Blank = No PT:   OT:   RN:   Equipment:   Other:     Signed: Gae Gallop, MD  PGY-1, Internal Medicine Resident Pager: [phone] (7AM-5PM) 01/06/2013, 4:38 PM

## 2013-01-06 NOTE — ED Notes (Signed)
Tried to get urine but patient states he only urinates every couple of days and can't give urine sample.

## 2013-01-06 NOTE — ED Provider Notes (Signed)
History     CSN: [id number]  Arrival date & time 01/06/13  1048   First MD Initiated Contact with Patient 01/06/13 1109      Chief Complaint  Patient presents with  . Abnormal Lab    (Consider location/radiation/quality/duration/timing/severity/associated sxs/prior treatment) HPI  Patient has end-stage renal disease and gets his dialysis done in [place]. He goes Monday, Wednesday, Friday, and he went yesterday which was Monday. He states he had to stop after 2 hours because he was having chills. [facility] where he had blood work drawn. He was given antibiotics in the ED and was discharged. He reports he was called this morning and told that he had "septic blood" and was told to come to the emergency department. He states his highest fever yesterday was around 100. He denies cough, sore throat, rhinorrhea, nausea, vomiting, or diarrhea. He denies feeling short of breath. Patient states he makes urine about once a day and has are voided this morning. Patient has a graft in his right upper arm.  Patient reports on January 9 he had a chest tube inserted in his right chest by Dr. Roxan Hockey for too much fluid in his chest. He reports that they drained about 200 cc of fluid daily. It was last drained last night. He states the drainage seems to be getting less.  PCP none Nephrologist [place] [facility] [place]  Past Medical History  Diagnosis Date  . End-stage renal disease on hemodialysis   . Secondary hyperparathyroidism   . Hypertension   . Polysubstance abuse   . Anemia of chronic disease     BL Hgb ~10  . Tobacco abuse     Past Surgical History  Procedure Date  . Surgery for horseshoe kidney   . Colonoscopy 12/16/2012    Procedure: COLONOSCOPY;  Surgeon: Milus Banister, MD;  Location: [facility];  Service: Endoscopy;  Laterality: N/A;  . Video assisted thoracoscopy 12/18/2012    Procedure: VIDEO ASSISTED THORACOSCOPY;  Surgeon:  Melrose Nakayama, MD;  Location: [place];  Service: Thoracic;  Laterality: Right;  pleural peel  . Pleural effusion drainage 12/18/2012    Procedure: DRAINAGE OF PLEURAL EFFUSION;  Surgeon: Melrose Nakayama, MD;  Location: [facility];  Service: Thoracic;  Laterality: Right;    Family History  Problem Relation Age of Onset  . Thyroid disease Mother   . Renal Disease      History  Substance Use Topics  . Smoking status: Current Every Day Smoker -- 0.5 packs/day for 20 years    Types: Cigarettes  . Smokeless tobacco: Not on file  . Alcohol Use: Yes     Comment: occasional   Quit smoking 12/10/12 Lives at home Lives with son   Review of Systems  All other systems reviewed and are negative.    Allergies  Review of patient's allergies indicates no known allergies.  Home Medications   Current Outpatient Rx  Name  Route  Sig  Dispense  Refill  . CALCIUM ACETATE 667 MG PO CAPS   Oral   Take 2,668 mg by mouth 3 (three) times daily with meals.          Marland Kitchen CALCIUM ACETATE 667 MG PO CAPS   Oral   Take 2,001 mg by mouth with snacks.         Marland Kitchen CINACALCET HCL 90 MG PO TABS   Oral   Take 180 mg by mouth daily.         Marland Kitchen  DARBEPOETIN ALFA-POLYSORBATE 100 MCG/0.5ML IJ SOLN   Intravenous   Inject 100 mcg into the vein every Friday with hemodialysis.         Marland Kitchen DOXERCALCIFEROL 4 MCG/2ML IV SOLN   Intravenous   Inject 4.5 mcg into the vein every Monday, Wednesday, and Friday with hemodialysis.         Marland Kitchen METOPROLOL TARTRATE 25 MG PO TABS   Oral   Take 25 mg by mouth 2 (two) times daily.         Marland Kitchen RENA-VITE PO TABS   Oral   Take 1 tablet by mouth at bedtime.         . OXYCODONE-ACETAMINOPHEN 5-325 MG PO TABS   Oral   Take 1-2 tablets by mouth every 4 (four) hours as needed. For pain           BP 104/55  Pulse 120  Temp 98.6 F (37 C) (Oral)  Resp 20  Ht 6\' 1"  (1.854 m)  Wt 220 lb 7.4 oz (100 kg)  BMI 29.09 kg/m2  SpO2 96%  Vital signs normal except  tachycardia   Physical Exam  Nursing note and vitals reviewed. Constitutional: He is oriented to person, place, and time. He appears well-developed and well-nourished.  Non-toxic appearance. He does not appear ill. No distress.       Patient appears uncomfortable.  HENT:  Head: Normocephalic and atraumatic.  Right Ear: External ear normal.  Left Ear: External ear normal.  Nose: Nose normal. No mucosal edema or rhinorrhea.  Mouth/Throat: Oropharynx is clear and moist and mucous membranes are normal. No dental abscesses or uvula swelling.  Eyes: Conjunctivae normal and EOM are normal. Pupils are equal, round, and reactive to light.  Neck: Normal range of motion and full passive range of motion without pain. Neck supple.  Cardiovascular: Normal rate, regular rhythm and normal heart sounds.  Exam reveals no gallop and no friction rub.   No murmur heard. Pulmonary/Chest: Effort normal. No respiratory distress. He has no wheezes. He has no rhonchi. He has no rales. He exhibits no tenderness and no crepitus.       Patient has a chest tube with drainage system in his right chest. There is 200 cc of bloody fluid collected. Patient has diminished breath sounds on the right.  Abdominal: Soft. Normal appearance and bowel sounds are normal. He exhibits no distension. There is no tenderness. There is no rebound and no guarding.  Musculoskeletal: Normal range of motion. He exhibits no edema and no tenderness.       Moves all extremities well.  Patient has a graft in his right upper arm. It does not appear to have any areas of redness or swelling.  Neurological: He is alert and oriented to person, place, and time. He has normal strength. No cranial nerve deficit.  Skin: Skin is warm, dry and intact. No rash noted. No erythema. No pallor.  Psychiatric: His speech is normal and behavior is normal. His mood appears not anxious.       Affect flat    ED Course  Procedures (including critical care  time)   Medications  doxercalciferol (HECTOROL) 4 MCG/2ML injection (not administered)  darbepoetin (ARANESP) 100 MCG/0.5ML SOLN (not administered)  metoprolol tartrate (LOPRESSOR) 25 MG tablet (not administered)  multivitamin (RENA-VIT) TABS tablet (not administered)  oxyCODONE-acetaminophen (PERCOCET/ROXICET) 5-325 MG per tablet (not administered)  vancomycin (VANCOCIN) IVPB 1000 mg/200 mL premix (0 mg Intravenous Stopped 01/06/13 1426)  sodium polystyrene (KAYEXALATE) 15 GM/60ML suspension  45 g (45 g Oral Given 01/06/13 1318)  ceFEPIme (MAXIPIME) 2 g in dextrose 5 % 50 mL IVPB (0 g Intravenous Stopped 01/06/13 1520)    Recheck at 13 10 PM patient's blood pressure is 91 systolic. His heart rate is 107. Temperature remains 98.6. Pt has vancomycin starting to infuse. Pt's bp was in the low 90's from about noon to 1:15 pm, responded to IV fluids.   Patient's hyperkalemia was treated with oral Kayexalate. His EKG did not show any cardiac changes from his hyperkalemia so IV medications were not given.  Laboratory results from [facility] from yesterday was obtained. His BUN was 41, creatinine 10.7, his potassium was 4.4, white count 14.3, hemoglobin 10.1, blood culture showed no growth in 18-24 hours however Gram stain did show gram-positive cocci in clusters   13:54 Dr Jonnie Finner, nephrology, states to call Dr Holley Raring in [facility]  15:40 Dr Moshe Cipro feels patient could be admitted if wanted, but could get more antibiotics tomorrow in dialysis  15:45 Son feels patient should be admitted, patient agrees. Son states he is supposed to be seen in 2 days about getting his chest tube removed.   15:49 Dr Simeon Craft, [facility] admit to tele, attending Dr Murlean Caller  Results for orders placed during the hospital encounter of 01/06/13  CBC WITH DIFFERENTIAL      Component Value Range   WBC 13.6 (*) 4.0 - 10.5 K/uL   RBC 2.96 (*) 4.22 - 5.81 MIL/uL   Hemoglobin 8.7 (*) 13.0 - 17.0 g/dL   HCT 27.2 (*)  39.0 - 52.0 %   MCV 91.9  78.0 - 100.0 fL   MCH 29.4  26.0 - 34.0 pg   MCHC 32.0  30.0 - 36.0 g/dL   RDW 15.6 (*) 11.5 - 15.5 %   Platelets 176  150 - 400 K/uL   Neutrophils Relative 76  43 - 77 %   Lymphocytes Relative 10 (*) 12 - 46 %   Monocytes Relative 14 (*) 3 - 12 %   Eosinophils Relative 0  0 - 5 %   Basophils Relative 0  0 - 1 %   Neutro Abs 10.3 (*) 1.7 - 7.7 K/uL   Lymphs Abs 1.4  0.7 - 4.0 K/uL   Monocytes Absolute 1.9 (*) 0.1 - 1.0 K/uL   Eosinophils Absolute 0.0  0.0 - 0.7 K/uL   Basophils Absolute 0.0  0.0 - 0.1 K/uL   RBC Morphology TARGET CELLS    COMPREHENSIVE METABOLIC PANEL      Component Value Range   Sodium 137  135 - 145 mEq/L   Potassium 5.9 (*) 3.5 - 5.1 mEq/L   Chloride 95 (*) 96 - 112 mEq/L   CO2 27  19 - 32 mEq/L   Glucose, Bld 90  70 - 99 mg/dL   BUN 61 (*) 6 - 23 mg/dL   Creatinine, Ser 14.14 (*) 0.50 - 1.35 mg/dL   Calcium 10.3  8.4 - 10.5 mg/dL   Total Protein 7.1  6.0 - 8.3 g/dL   Albumin 2.3 (*) 3.5 - 5.2 g/dL   AST 12  0 - 37 U/L   ALT 11  0 - 53 U/L   Alkaline Phosphatase 69  39 - 117 U/L   Total Bilirubin 0.5  0.3 - 1.2 mg/dL   GFR calc non Af Amer 4 (*) >90 mL/min   GFR calc Af Amer 4 (*) >90 mL/min  CG4 I-STAT (LACTIC ACID)      Component Value  Range   Lactic Acid, Venous 1.37  0.5 - 2.2 mmol/L  CBC WITH DIFFERENTIAL      Component Value Range   WBC 14.0 (*) 4.0 - 10.5 K/uL   RBC 3.04 (*) 4.22 - 5.81 MIL/uL   Hemoglobin 8.8 (*) 13.0 - 17.0 g/dL   HCT 28.1 (*) 39.0 - 52.0 %   MCV 92.4  78.0 - 100.0 fL   MCH 28.9  26.0 - 34.0 pg   MCHC 31.3  30.0 - 36.0 g/dL   RDW 15.8 (*) 11.5 - 15.5 %   Platelets 180  150 - 400 K/uL   Neutrophils Relative 76  43 - 77 %   Neutro Abs 10.7 (*) 1.7 - 7.7 K/uL   Lymphocytes Relative 13  12 - 46 %   Lymphs Abs 1.8  0.7 - 4.0 K/uL   Monocytes Relative 11  3 - 12 %   Monocytes Absolute 1.6 (*) 0.1 - 1.0 K/uL   Eosinophils Relative 0  0 - 5 %   Eosinophils Absolute 0.0  0.0 - 0.7 K/uL   Basophils  Relative 0  0 - 1 %   Basophils Absolute 0.0  0.0 - 0.1 K/uL  COMPREHENSIVE METABOLIC PANEL      Component Value Range   Sodium 135  135 - 145 mEq/L   Potassium 5.9 (*) 3.5 - 5.1 mEq/L   Chloride 93 (*) 96 - 112 mEq/L   CO2 26  19 - 32 mEq/L   Glucose, Bld 104 (*) 70 - 99 mg/dL   BUN 63 (*) 6 - 23 mg/dL   Creatinine, Ser 14.01 (*) 0.50 - 1.35 mg/dL   Calcium 10.2  8.4 - 10.5 mg/dL   Total Protein 7.2  6.0 - 8.3 g/dL   Albumin 2.2 (*) 3.5 - 5.2 g/dL   AST 13  0 - 37 U/L   ALT 11  0 - 53 U/L   Alkaline Phosphatase 70  39 - 117 U/L   Total Bilirubin 0.5  0.3 - 1.2 mg/dL   GFR calc non Af Amer 4 (*) >90 mL/min   GFR calc Af Amer 4 (*) >90 mL/min   Laboratory interpretation all normal except no renal failure, hyperkalemia, stable anemia, new leukocytosis   Dg Chest 2 View  01/06/2013  *RADIOLOGY REPORT*  Clinical Data: Chest pain, chest tube  CHEST - 2 VIEW  Comparison: Prior chest x-ray 12/30/2012  Findings: Similar position of right-sided thoracostomy tubes.  The left IJ central venous catheter has been removed.  Unchanged appearance of residual pleural fluid versus pleural thickening and right apical pneumothorax.  Opacities in the right upper and mid lung are also similar to prior.  Stable mild cardiomegaly.  The left lung remains relatively well aerated.  IMPRESSION:  1.  Very similar appearance of the right lung compared to 12/30/2012.  Persistent right apical pneumothorax (possibly ex vacuo with stuck lung) and residual right pleural fluid versus pleural thickening. 2.  Stable right upper and mid lung opacities. 3.  Stable mild cardiomegaly.   Original Report Authenticated By: Jacqulynn Cadet, M.D.       Date: 01/06/2013  Rate: 125  Rhythm: sinus tachycardia  QRS Axis: normal  Intervals: normal  ST/T Wave abnormalities: normal  Conduction Disutrbances:none  Narrative Interpretation: low voltage, PRWP  Old EKG Reviewed: changes noted HR was 101     1. Fever   2.  Bacteremia   3. Hypotension   4. ESRD on hemodialysis   5. Hyperkalemia  6. Hx of chest tube placement    Plan admission  CRITICAL CARE Performed by: Rolland Porter L   Total critical care time: 38 min   Critical care time was exclusive of separately billable procedures and treating other patients.  Critical care was necessary to treat or prevent imminent or life-threatening deterioration.  Critical care was time spent personally by me on the following activities: development of treatment plan with patient and/or surrogate as well as nursing, discussions with consultants, evaluation of patient's response to treatment, examination of patient, obtaining history from patient or surrogate, ordering and performing treatments and interventions, ordering and review of laboratory studies, ordering and review of radiographic studies, pulse oximetry and re-evaluation of patient's condition.    MDM          Janice Norrie, MD 01/06/13 1601

## 2013-01-06 NOTE — Progress Notes (Signed)
ANTIBIOTIC CONSULT NOTE - INITIAL  Pharmacy Consult for Vancomycin/Zosyn Indication: r/o bacteremia  No Known Allergies  Patient Measurements: Height: 6\' 1"  (185.4 cm) Weight: 220 lb 7.4 oz (100 kg) IBW/kg (Calculated) : 79.9   Vital Signs: Temp: 100.6 F (38.1 C) (01/28 1720) Temp src: Oral (01/28 1720) BP: 98/75 mmHg (01/28 1859) Pulse Rate: 119  (01/28 1859) Intake/Output from previous day:   Intake/Output from this shift:    Labs:  Basename 01/06/13 1407 01/06/13 1134  WBC 14.0* 13.6*  HGB 8.8* 8.7*  PLT 180 176  LABCREA -- --  CREATININE 14.01* 14.14*   Estimated Creatinine Clearance: 7.9 ml/min (by C-G formula based on Cr of 14.01). No results found for this basename: VANCOTROUGH:2,VANCOPEAK:2,VANCORANDOM:2,GENTTROUGH:2,GENTPEAK:2,GENTRANDOM:2,TOBRATROUGH:2,TOBRAPEAK:2,TOBRARND:2,AMIKACINPEAK:2,AMIKACINTROU:2,AMIKACIN:2, in the last 72 hours   Microbiology: Recent Results (from the past 720 hour(s))  MRSA PCR SCREENING     Status: Normal   Collection Time   12/11/12  9:23 PM      Component Value Range Status Comment   MRSA by PCR NEGATIVE  NEGATIVE Final   BODY FLUID CULTURE     Status: Normal   Collection Time   12/18/12  3:31 PM      Component Value Range Status Comment   Specimen Description PLEURAL FLUID RIGHT   Final    Special Requests PATIENT ON FOLLOWING VANCOMYCIN   Final    Gram Stain     Final    Value: NO WBC SEEN     NO ORGANISMS SEEN   Culture NO GROWTH 3 DAYS   Final    Report Status 12/21/2012 FINAL   Final   AFB CULTURE WITH SMEAR     Status: Normal (Preliminary result)   Collection Time   12/18/12  3:31 PM      Component Value Range Status Comment   Specimen Description PLEURAL FLUID RIGHT   Final    Special Requests PATIENT ON FOLLOWING VANCOMYCIN   Final    ACID FAST SMEAR NO ACID FAST BACILLI SEEN   Final    Culture     Final    Value: CULTURE WILL BE EXAMINED FOR 6 WEEKS BEFORE ISSUING A FINAL REPORT   Report Status PENDING    Incomplete   FUNGUS CULTURE W SMEAR     Status: Normal (Preliminary result)   Collection Time   12/18/12  3:31 PM      Component Value Range Status Comment   Specimen Description PLEURAL FLUID RIGHT   Final    Special Requests PATIENT ON FOLLOWING VANCOMYCIN   Final    Fungal Smear NO YEAST OR FUNGAL ELEMENTS SEEN   Final    Culture CULTURE IN PROGRESS FOR FOUR WEEKS   Final    Report Status PENDING   Incomplete     Medical History: Past Medical History  Diagnosis Date  . End-stage renal disease on hemodialysis   . Secondary hyperparathyroidism   . Hypertension   . Polysubstance abuse   . Anemia of chronic disease     BL Hgb ~10  . Tobacco abuse     Medications:   (Not in a hospital admission) Assessment: 52 y/o male dialysis patient admitted with fever and leukocytosis requiring broad spectrum antibiotics for r/o bacteremia. Previously at [facility] where blood cx drawn and gram stain shows gpcc. Will empirically start vanc/cefepime and f/u c&s. Received cefepime 2g and vanc 1g in ED.  Goal of Therapy:  Pre-HD vanc level 15-25  Plan:  Will give additional 1g vanc now for total  2g load then 1g QHD with cefepime 500mg  q24h.  Davonna Belling, PharmD, BCPS Pager [phone]   Wyvonne Lenz 01/06/2013,7:03 PM

## 2013-01-06 NOTE — ED Notes (Signed)
Patient received dialysis one day ago and sent to hospital for fever.  Received a phone call today for abnormal lab stated "sepsis"  Patient has recently a pneumothorax and has chest tube in place.

## 2013-01-06 NOTE — ED Notes (Signed)
diet tray ordered

## 2013-01-06 NOTE — ED Notes (Signed)
Pt's lab results from [facility] received, EDP notified.

## 2013-01-06 NOTE — ED Notes (Signed)
Pt states he cannot urinate at this time and refuses in-and-out cath.

## 2013-01-06 NOTE — ED Notes (Signed)
Pt feels weak, denies pain other than chest tube site to right side, son at bedside. Pt has dialysis shunt at right arm. A&Ox4, ambulatory with stand-by assist.

## 2013-01-07 DIAGNOSIS — R509 Fever, unspecified: Secondary | ICD-10-CM

## 2013-01-07 DIAGNOSIS — F1910 Other psychoactive substance abuse, uncomplicated: Secondary | ICD-10-CM

## 2013-01-07 DIAGNOSIS — N186 End stage renal disease: Secondary | ICD-10-CM

## 2013-01-07 DIAGNOSIS — A4901 Methicillin susceptible Staphylococcus aureus infection, unspecified site: Secondary | ICD-10-CM

## 2013-01-07 DIAGNOSIS — Z992 Dependence on renal dialysis: Secondary | ICD-10-CM

## 2013-01-07 DIAGNOSIS — Z9889 Other specified postprocedural states: Secondary | ICD-10-CM

## 2013-01-07 DIAGNOSIS — R7881 Bacteremia: Secondary | ICD-10-CM

## 2013-01-07 DIAGNOSIS — D638 Anemia in other chronic diseases classified elsewhere: Secondary | ICD-10-CM

## 2013-01-07 DIAGNOSIS — J90 Pleural effusion, not elsewhere classified: Secondary | ICD-10-CM

## 2013-01-07 DIAGNOSIS — A419 Sepsis, unspecified organism: Secondary | ICD-10-CM

## 2013-01-07 LAB — BASIC METABOLIC PANEL
Chloride: 94 mEq/L — ABNORMAL LOW (ref 96–112)
Creatinine, Ser: 14.82 mg/dL — ABNORMAL HIGH (ref 0.50–1.35)
GFR calc Af Amer: 4 mL/min — ABNORMAL LOW (ref >90)
GFR calc non Af Amer: 3 mL/min — ABNORMAL LOW (ref >90)

## 2013-01-07 LAB — MRSA PCR SCREENING: MRSA by PCR: NEGATIVE

## 2013-01-07 MED ORDER — HEPARIN SODIUM (PORCINE) 1000 UNIT/ML DIALYSIS
1000.0000 [IU] | INTRAMUSCULAR | Status: DC | PRN
  Filled 2013-01-07: qty 1

## 2013-01-07 MED ORDER — PIPERACILLIN-TAZOBACTAM IN DEX 2-0.25 GM/50ML IV SOLN
2.2500 g | Freq: Three times a day (TID) | INTRAVENOUS | Status: DC
  Administered 2013-01-07: 2.25 g via INTRAVENOUS
  Filled 2013-01-07 (×4): qty 50

## 2013-01-07 MED ORDER — HYDROMORPHONE HCL PF 1 MG/ML IJ SOLN: 1.0000 mg | INTRAMUSCULAR | Status: DC | PRN

## 2013-01-07 MED ORDER — HYDROMORPHONE HCL PF 1 MG/ML IJ SOLN
INTRAMUSCULAR | Status: AC
  Filled 2013-01-07: qty 1

## 2013-01-07 MED ORDER — NEPRO/CARBSTEADY PO LIQD
237.0000 mL | ORAL | Status: DC | PRN
  Filled 2013-01-07: qty 237

## 2013-01-07 MED ORDER — SODIUM CHLORIDE 0.9 % IV SOLN: 100.0000 mL | INTRAVENOUS | Status: DC | PRN

## 2013-01-07 MED ORDER — LIDOCAINE-PRILOCAINE 2.5-2.5 % EX CREA
1.0000 "application " | TOPICAL_CREAM | CUTANEOUS | Status: DC | PRN
  Filled 2013-01-07: qty 5

## 2013-01-07 MED ORDER — DARBEPOETIN ALFA-POLYSORBATE 150 MCG/0.3ML IJ SOLN
150.0000 ug | INTRAMUSCULAR | Status: DC
  Administered 2013-01-09 – 2013-01-16 (×2): 150 ug via INTRAVENOUS
  Filled 2013-01-07 (×2): qty 0.3

## 2013-01-07 MED ORDER — OXYCODONE-ACETAMINOPHEN 5-325 MG PO TABS
ORAL_TABLET | ORAL | Status: AC
  Administered 2013-01-08: 2 via ORAL
  Filled 2013-01-07: qty 2

## 2013-01-07 MED ORDER — DOXERCALCIFEROL 4 MCG/2ML IV SOLN
INTRAVENOUS | Status: AC
  Administered 2013-01-07: 2 ug via INTRAVENOUS
  Filled 2013-01-07: qty 2

## 2013-01-07 MED ORDER — CEFAZOLIN SODIUM-DEXTROSE 2-3 GM-% IV SOLR
2.0000 g | INTRAVENOUS | Status: DC
  Administered 2013-01-07 – 2013-01-19 (×5): 2 g via INTRAVENOUS
  Filled 2013-01-07 (×9): qty 50

## 2013-01-07 MED ORDER — PENTAFLUOROPROP-TETRAFLUOROETH EX AERO: 1.0000 "application " | INHALATION_SPRAY | CUTANEOUS | Status: DC | PRN

## 2013-01-07 MED ORDER — HEPARIN SODIUM (PORCINE) 1000 UNIT/ML DIALYSIS
20.0000 [IU]/kg | INTRAMUSCULAR | Status: DC | PRN
  Filled 2013-01-07: qty 2

## 2013-01-07 MED ORDER — ALTEPLASE 2 MG IJ SOLR
2.0000 mg | Freq: Once | INTRAMUSCULAR | Status: AC | PRN
  Filled 2013-01-07: qty 2

## 2013-01-07 MED ORDER — DOXERCALCIFEROL 4 MCG/2ML IV SOLN
2.0000 ug | INTRAVENOUS | Status: DC
  Administered 2013-01-07 – 2013-01-19 (×5): 2 ug via INTRAVENOUS
  Filled 2013-01-07 (×5): qty 2

## 2013-01-07 MED ORDER — LIDOCAINE HCL (PF) 1 % IJ SOLN: 5.0000 mL | INTRAMUSCULAR | Status: DC | PRN

## 2013-01-07 NOTE — Progress Notes (Signed)
Patient admitted to 3305; Chest tube site dressing changed; tan drainage at site; Patient had bowel movement on admission; vss; will continue to monitor.   Bloomington Cellar RN

## 2013-01-07 NOTE — Progress Notes (Signed)
ANTIBIOTIC CONSULT NOTE - INITIAL  Pharmacy Consult for Zosyn Indication: r/o bacteremia  No Known Allergies  Patient Measurements: Height: 5\' 4"  (162.6 cm) Weight: 143 lb 4.8 oz (65 kg) IBW/kg (Calculated) : 59.2   Vital Signs: Temp: 99.3 F (37.4 C) (01/29 0808) Temp src: Oral (01/29 0808) BP: 112/56 mmHg (01/29 0439) Pulse Rate: 104  (01/29 0439) Intake/Output from previous day:   Intake/Output from this shift:    Labs:  Basename 01/06/13 2330 01/06/13 1407 01/06/13 1134  WBC -- 14.0* 13.6*  HGB -- 8.8* 8.7*  PLT -- 180 176  LABCREA -- -- --  CREATININE 14.82* 14.01* 14.14*   Estimated Creatinine Clearance: 5 ml/min (by C-G formula based on Cr of 14.82). No results found for this basename: VANCOTROUGH:2,VANCOPEAK:2,VANCORANDOM:2,GENTTROUGH:2,GENTPEAK:2,GENTRANDOM:2,TOBRATROUGH:2,TOBRAPEAK:2,TOBRARND:2,AMIKACINPEAK:2,AMIKACINTROU:2,AMIKACIN:2, in the last 72 hours   Microbiology: Recent Results (from the past 720 hour(s))  MRSA PCR SCREENING     Status: Normal   Collection Time   12/11/12  9:23 PM      Component Value Range Status Comment   MRSA by PCR NEGATIVE  NEGATIVE Final   BODY FLUID CULTURE     Status: Normal   Collection Time   12/18/12  3:31 PM      Component Value Range Status Comment   Specimen Description PLEURAL FLUID RIGHT   Final    Special Requests PATIENT ON FOLLOWING VANCOMYCIN   Final    Gram Stain     Final    Value: NO WBC SEEN     NO ORGANISMS SEEN   Culture NO GROWTH 3 DAYS   Final    Report Status 12/21/2012 FINAL   Final   AFB CULTURE WITH SMEAR     Status: Normal (Preliminary result)   Collection Time   12/18/12  3:31 PM      Component Value Range Status Comment   Specimen Description PLEURAL FLUID RIGHT   Final    Special Requests PATIENT ON FOLLOWING VANCOMYCIN   Final    ACID FAST SMEAR NO ACID FAST BACILLI SEEN   Final    Culture     Final    Value: CULTURE WILL BE EXAMINED FOR 6 WEEKS BEFORE ISSUING A FINAL REPORT   Report  Status PENDING   Incomplete   FUNGUS CULTURE W SMEAR     Status: Normal (Preliminary result)   Collection Time   12/18/12  3:31 PM      Component Value Range Status Comment   Specimen Description PLEURAL FLUID RIGHT   Final    Special Requests PATIENT ON FOLLOWING VANCOMYCIN   Final    Fungal Smear NO YEAST OR FUNGAL ELEMENTS SEEN   Final    Culture CULTURE IN PROGRESS FOR FOUR WEEKS   Final    Report Status PENDING   Incomplete   MRSA PCR SCREENING     Status: Normal   Collection Time   01/07/13 12:18 AM      Component Value Range Status Comment   MRSA by PCR NEGATIVE  NEGATIVE Final     Medical History: Past Medical History  Diagnosis Date  . End-stage renal disease on hemodialysis   . Secondary hyperparathyroidism   . Hypertension   . Polysubstance abuse   . Anemia of chronic disease     BL Hgb ~10  . Tobacco abuse    Assessment: 52 y/o male with ESRD on HD who was admitted yesterday for fever and 1 of 2 blood cultures with GPC drawn at [facility] ED. Patient was  started on cefepime yesterday but pharmacy consulted to change to Zosyn today. Blood cultures drawn here are pending. He is febrile and WBC are elevated.  Goal of Therapy:  resolution of infection  Plan:  -Zosyn 2.25 g IV q8h -Follow-up microdata and LOT  Darden, Stanton.D., BCPS Clinical Pharmacist Pager: [phone] 01/07/2013 8:24 AM

## 2013-01-07 NOTE — Procedures (Signed)
Patient was seen on dialysis and the procedure was supervised.  BFR 100  Via AVG  BP is  121/63.   Patient appears to be tolerating treatment well, just getting started  [name] [name] A 01/07/2013

## 2013-01-07 NOTE — Progress Notes (Signed)
ANTIBIOTIC CONSULT NOTE - INITIAL  Pharmacy Consult for: Cefazolin Indication: Bacteremia (S. Aureus)  No Known Allergies  Patient Measurements: Height: 5\' 4"  (162.6 cm) Weight: 143 lb 4.8 oz (65 kg) IBW/kg (Calculated) : 59.2   Vital Signs: Temp: 100 F (37.8 C) (01/29 1746) Temp src: Oral (01/29 1746) BP: 127/75 mmHg (01/29 1755) Pulse Rate: 130  (01/29 1755)  Labs:  Basename 01/06/13 2330 01/06/13 1407 01/06/13 1134  WBC -- 14.0* 13.6*  HGB -- 8.8* 8.7*  PLT -- 180 176  LABCREA -- -- --  CREATININE 14.82* 14.01* 14.14*   Estimated Creatinine Clearance: 5 ml/min (by C-G formula based on Cr of 14.82).   Assessment: 52 yo male with ESRD-MWF being started on Cefazolin for S.aureus bacteremia (culture from [place]) per ID recommendation in addition to IV vancomycin while awaiting sensitivities. Tmax 102.3'F, WBC 14, currently in HD.  Antibiotics: Cefazolin 1/29 >> Vancomycin 1/28 >> Zosyn 1/29 >> 1/29 Cefepime 1/28>1/28  Cultures Blood x 2 (1/28) NGTD pending Blood ([place]) - S.aureus sensi pending (per MD notes)  Goal of Therapy:  Eradication of infection Clinical improvement  Plan:  1) Start cefazolin 2g IV qHD-MWF (administer after HD sessions) 2) F/u sensitivites, clinical status, HD sessions  Woodroe Chen, PharmD    01/07/2013   6:31 PM

## 2013-01-07 NOTE — Consult Note (Signed)
Reason for Consult:To manage dialysis and dialysis related needs Referring Physician: Murlean Caller  HPI:  [name] [name] is an 52 y.o. male with PMhx significant for HTN, polysubstance abuse and recent VATS for chronic right sided pleural effusion done on 1/9 per Dr. Roxan Hockey and he still has CT in place.  He was in his USOH , denies cough or cold when on dialysis Monday developed fever and chills, so much so that he had to end his treatment early.  They drew blood cultures which are positive in both bottles but no information is known yet ([facility] [phone]) He was also given vanc at HD.  From dialysis he was sent to [facility] where they also drew blood cultures and gave him ABX (kind is unknown).   He was sent home from there on Monday night but then called yesterday to come back because he had "septic blood".   Instead he came to ER here, given vanc and cefepime after set of blood cultures drawn here.  Overnight he had a temp of 102.3.  His AVG looks OK to visualize and it is not bothering him.  The exit site for the CT is tender and there is some milky drainage in the tube, but mostly serosang.  Patient does not admit to other possible sources of infection   Dialyzes at [place] on MWF  Primary Nephrologist [place]. EDW 100, time 3.15 HD Bath 2 K, 2 Ca, Heparin no. Access Right upper arm AVG.  Past Medical History  Diagnosis Date  . End-stage renal disease on hemodialysis   . Secondary hyperparathyroidism   . Hypertension   . Polysubstance abuse   . Anemia of chronic disease     BL Hgb ~10  . Tobacco abuse     Past Surgical History  Procedure Date  . Surgery for horseshoe kidney   . Colonoscopy 12/16/2012    Procedure: COLONOSCOPY;  Surgeon: Milus Banister, MD;  Location: [facility];  Service: Endoscopy;  Laterality: N/A;  . Video assisted thoracoscopy 12/18/2012    Procedure: VIDEO ASSISTED THORACOSCOPY;  Surgeon: Melrose Nakayama, MD;   Location: [facility];  Service: Thoracic;  Laterality: Right;  pleural peel  . Pleural effusion drainage 12/18/2012    Procedure: DRAINAGE OF PLEURAL EFFUSION;  Surgeon: Melrose Nakayama, MD;  Location: [facility];  Service: Thoracic;  Laterality: Right;    Family History  Problem Relation Age of Onset  . Thyroid disease Mother   . Renal Disease      Social History:  reports that he has been smoking Cigarettes.  He has a 10 pack-year smoking history. He does not have any smokeless tobacco history on file. He reports that he drinks alcohol. He reports that he uses illicit drugs.  Allergies: No Known Allergies  Medications: I have reviewed the patient's current medications.  Hectorol 2 mcg per HD Epogen 11000 units per HD Venofer  50 q week  Results for orders placed during the hospital encounter of 01/06/13 (from the past 48 hour(s))  CBC WITH DIFFERENTIAL     Status: Abnormal   Collection Time   01/06/13 11:34 AM      Component Value Range Comment   WBC 13.6 (*) 4.0 - 10.5 K/uL    RBC 2.96 (*) 4.22 - 5.81 MIL/uL    Hemoglobin 8.7 (*) 13.0 - 17.0 g/dL    HCT 27.2 (*) 39.0 - 52.0 %    MCV 91.9  78.0 - 100.0 fL  MCH 29.4  26.0 - 34.0 pg    MCHC 32.0  30.0 - 36.0 g/dL    RDW 15.6 (*) 11.5 - 15.5 %    Platelets 176  150 - 400 K/uL    Neutrophils Relative 76  43 - 77 %    Lymphocytes Relative 10 (*) 12 - 46 %    Monocytes Relative 14 (*) 3 - 12 %    Eosinophils Relative 0  0 - 5 %    Basophils Relative 0  0 - 1 %    Neutro Abs 10.3 (*) 1.7 - 7.7 K/uL    Lymphs Abs 1.4  0.7 - 4.0 K/uL    Monocytes Absolute 1.9 (*) 0.1 - 1.0 K/uL    Eosinophils Absolute 0.0  0.0 - 0.7 K/uL    Basophils Absolute 0.0  0.0 - 0.1 K/uL    RBC Morphology TARGET CELLS     COMPREHENSIVE METABOLIC PANEL     Status: Abnormal   Collection Time   01/06/13 11:34 AM      Component Value Range Comment   Sodium 137  135 - 145 mEq/L    Potassium 5.9 (*) 3.5 - 5.1 mEq/L    Chloride 95 (*) 96 - 112 mEq/L    CO2 27   19 - 32 mEq/L    Glucose, Bld 90  70 - 99 mg/dL    BUN 61 (*) 6 - 23 mg/dL    Creatinine, Ser 14.14 (*) 0.50 - 1.35 mg/dL    Calcium 10.3  8.4 - 10.5 mg/dL    Total Protein 7.1  6.0 - 8.3 g/dL    Albumin 2.3 (*) 3.5 - 5.2 g/dL    AST 12  0 - 37 U/L    ALT 11  0 - 53 U/L    Alkaline Phosphatase 69  39 - 117 U/L    Total Bilirubin 0.5  0.3 - 1.2 mg/dL    GFR calc non Af Amer 4 (*) >90 mL/min    GFR calc Af Amer 4 (*) >90 mL/min   CULTURE, BLOOD (ROUTINE X 2)     Status: Normal (Preliminary result)   Collection Time   01/06/13 11:34 AM      Component Value Range Comment   Specimen Description BLOOD ARM LEFT      Special Requests BOTTLES DRAWN AEROBIC AND ANAEROBIC 10CC      Culture  Setup Time 01/06/2013 18:01      Culture        Value:        BLOOD CULTURE RECEIVED NO GROWTH TO DATE CULTURE WILL BE HELD FOR 5 DAYS BEFORE ISSUING A FINAL NEGATIVE REPORT   Report Status PENDING     CULTURE, BLOOD (ROUTINE X 2)     Status: Normal (Preliminary result)   Collection Time   01/06/13 11:45 AM      Component Value Range Comment   Specimen Description BLOOD ARM LEFT      Special Requests BOTTLES DRAWN AEROBIC AND ANAEROBIC 10CC      Culture  Setup Time 01/06/2013 18:01      Culture        Value:        BLOOD CULTURE RECEIVED NO GROWTH TO DATE CULTURE WILL BE HELD FOR 5 DAYS BEFORE ISSUING A FINAL NEGATIVE REPORT   Report Status PENDING     CG4 I-STAT (LACTIC ACID)     Status: Normal   Collection Time   01/06/13 11:51 AM  Component Value Range Comment   Lactic Acid, Venous 1.37  0.5 - 2.2 mmol/L   CBC WITH DIFFERENTIAL     Status: Abnormal   Collection Time   01/06/13  2:07 PM      Component Value Range Comment   WBC 14.0 (*) 4.0 - 10.5 K/uL    RBC 3.04 (*) 4.22 - 5.81 MIL/uL    Hemoglobin 8.8 (*) 13.0 - 17.0 g/dL    HCT 28.1 (*) 39.0 - 52.0 %    MCV 92.4  78.0 - 100.0 fL    MCH 28.9  26.0 - 34.0 pg    MCHC 31.3  30.0 - 36.0 g/dL    RDW 15.8 (*) 11.5 - 15.5 %    Platelets 180   150 - 400 K/uL    Neutrophils Relative 76  43 - 77 %    Neutro Abs 10.7 (*) 1.7 - 7.7 K/uL    Lymphocytes Relative 13  12 - 46 %    Lymphs Abs 1.8  0.7 - 4.0 K/uL    Monocytes Relative 11  3 - 12 %    Monocytes Absolute 1.6 (*) 0.1 - 1.0 K/uL    Eosinophils Relative 0  0 - 5 %    Eosinophils Absolute 0.0  0.0 - 0.7 K/uL    Basophils Relative 0  0 - 1 %    Basophils Absolute 0.0  0.0 - 0.1 K/uL   COMPREHENSIVE METABOLIC PANEL     Status: Abnormal   Collection Time   01/06/13  2:07 PM      Component Value Range Comment   Sodium 135  135 - 145 mEq/L    Potassium 5.9 (*) 3.5 - 5.1 mEq/L    Chloride 93 (*) 96 - 112 mEq/L    CO2 26  19 - 32 mEq/L    Glucose, Bld 104 (*) 70 - 99 mg/dL    BUN 63 (*) 6 - 23 mg/dL    Creatinine, Ser 14.01 (*) 0.50 - 1.35 mg/dL    Calcium 10.2  8.4 - 10.5 mg/dL    Total Protein 7.2  6.0 - 8.3 g/dL    Albumin 2.2 (*) 3.5 - 5.2 g/dL    AST 13  0 - 37 U/L    ALT 11  0 - 53 U/L    Alkaline Phosphatase 70  39 - 117 U/L    Total Bilirubin 0.5  0.3 - 1.2 mg/dL    GFR calc non Af Amer 4 (*) >90 mL/min    GFR calc Af Amer 4 (*) >90 mL/min   GLUCOSE, CAPILLARY     Status: Abnormal   Collection Time   01/06/13  8:10 PM      Component Value Range Comment   Glucose-Capillary 100 (*) 70 - 99 mg/dL    Comment 1 Notify RN      Comment 2 Documented in Chart     BASIC METABOLIC PANEL     Status: Abnormal   Collection Time   01/06/13 11:30 PM      Component Value Range Comment   Sodium 137  135 - 145 mEq/L    Potassium 5.1  3.5 - 5.1 mEq/L    Chloride 94 (*) 96 - 112 mEq/L    CO2 27  19 - 32 mEq/L    Glucose, Bld 110 (*) 70 - 99 mg/dL    BUN 75 (*) 6 - 23 mg/dL    Creatinine, Ser 14.82 (*) 0.50 - 1.35 mg/dL  Calcium 10.0  8.4 - 10.5 mg/dL    GFR calc non Af Amer 3 (*) >90 mL/min    GFR calc Af Amer 4 (*) >90 mL/min   MRSA PCR SCREENING     Status: Normal   Collection Time   01/07/13 12:18 AM      Component Value Range Comment   MRSA by PCR NEGATIVE  NEGATIVE      Dg Chest 2 View  01/06/2013  *RADIOLOGY REPORT*  Clinical Data: Chest pain, chest tube  CHEST - 2 VIEW  Comparison: Prior chest x-ray 12/30/2012  Findings: Similar position of right-sided thoracostomy tubes.  The left IJ central venous catheter has been removed.  Unchanged appearance of residual pleural fluid versus pleural thickening and right apical pneumothorax.  Opacities in the right upper and mid lung are also similar to prior.  Stable mild cardiomegaly.  The left lung remains relatively well aerated.  IMPRESSION:  1.  Very similar appearance of the right lung compared to 12/30/2012.  Persistent right apical pneumothorax (possibly ex vacuo with stuck lung) and residual right pleural fluid versus pleural thickening. 2.  Stable right upper and mid lung opacities. 3.  Stable mild cardiomegaly.   Original Report Authenticated By: Jacqulynn Cadet, M.D.     ROS: complains of CT insertion site pain, better with narcotics.  No issues with AVG, no cough , cold like symptoms.  No N/V/D.  No abdominal pain.  Denies edema as well or rashes.  Other wise ROS is negative   Blood pressure 121/63, pulse 101, temperature 99.2 F (37.3 C), temperature source Oral, resp. rate 12, height 5\' 4"  (1.626 m), weight 65 kg (143 lb 4.8 oz), SpO2 97.00%. General appearance: alert, cooperative and appears stated age Eyes: conjunctivae/corneas clear. PERRL, EOM's intact. Fundi benign. Neck: no adenopathy, no carotid bruit, no JVD, supple, symmetrical, trachea midline and thyroid not enlarged, symmetric, no tenderness/mass/nodules Resp: diminished breath sounds base - right Chest wall: right sided chest wall tenderness, large chest tube esit site, no pus or cellulitis burt PAINFUL Cardio: regular rate and rhythm, S1, S2 normal, no murmur, click, rub or gallop GI: soft, non-tender; bowel sounds normal; no masses,  no organomegaly Extremities: edema trace Neurologic: Grossly normal  R upper am AVG, good thrill and  bruit, no erythema or open areas  Assessment/Plan: 52 year old BM with ESRD presents with fever in the setting of having a chronic chest tube and a well functioning AVG 1 Fever- with bacteremia- AVG does not appear to be infected by exam or symptoms.  Sometimes you can get a transient bacteremia in dialysis that usually responds well to IV antibiotics.  He did seem to get coverage pretty promptly but is still febrile. The source for infection that I am most interested in is this chronic indwelling CT, true it does not show signs of obvious infection but needs to be stongly considered the source of this fever and bacteremia.  I agree with broad spectrum abx for now.  I will call the kidney center tomorrow for further culture data. Consider echo to look for endocarditis.  2 ESRD: Will continue MWF schedule.  Normally runs 3 hours and 15 min that may not be adequate.  I have ordered 3 and a half hours today.  3 Hypertension: Actually a little on the low side ?sepsis? Normally on metoprolol, currently being held 4. 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  Will hold iv iron in the setting of  bacteremia 5. 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  As well as IV hectorol with HD, will order all    Ivar Domangue A 01/07/2013, 1:25 PM

## 2013-01-07 NOTE — Progress Notes (Signed)
Pharmacy Note  Received a call from main pharmacy that floor RN hung vancomycin dose before patient went to HD. Spoke with HD RN who said a little over half of the bag had infused.  Little of the vancomycin infused will be left over after HD session. HD RN to give another vancomycin 1000 mg at the end of HD.  [place], Pharm.D., BCPS Clinical Pharmacist Pager: [phone] 01/07/2013 3:09 PM

## 2013-01-07 NOTE — Progress Notes (Addendum)
[address]            [place] [address]          [id number]          Subjective: Feels fair  Objective  Telemetry sinus tachy  Temp:  [98.2 F (36.8 C)-102.3 F (39.1 C)] 99.3 F (37.4 C) (01/29 0808) Pulse Rate:  [100-130] 108  (01/29 0808) Resp:  [9-26] 17  (01/29 0808) BP: (91-134)/(51-75) 116/62 mmHg (01/29 0808) SpO2:  [90 %-100 %] 99 % (01/29 0808) Weight:  [143 lb 4.8 oz (65 kg)-220 lb 7.4 oz (100 kg)] 143 lb 4.8 oz (65 kg) (01/28 2259)   Intake/Output Summary (Last 24 hours) at 01/07/13 0939 Last data filed at 01/07/13 0900  Gross per 24 hour  Intake    120 ml  Output      1 ml  Net    119 ml       General appearance: alert, cooperative, fatigued and no distress Heart: regular rate and rhythm and tachy Lungs: dimin  right base Wound: no cellulitis or purulent drainage  Lab Results:  [facility] 01/06/13 2330 01/06/13 1407  NA 137 135  K 5.1 5.9*  CL 94* 93*  CO2 27 26  GLUCOSE 110* 104*  BUN 75* 63*  CREATININE 14.82* 14.01*  CALCIUM 10.0 10.2  MG -- --  PHOS -- --    Basename 01/06/13 1407 01/06/13 1134  AST 13 12  ALT 11 11  ALKPHOS 70 69  BILITOT 0.5 0.5  PROT 7.2 7.1  ALBUMIN 2.2* 2.3*   No results found for this basename: LIPASE:2,AMYLASE:2 in the last 72 hours  Basename 01/06/13 1407 01/06/13 1134  WBC 14.0* 13.6*  NEUTROABS 10.7* 10.3*  HGB 8.8* 8.7*  HCT 28.1* 27.2*  MCV 92.4 91.9  PLT 180 176   No results found for this basename: CKTOTAL:4,CKMB:4,TROPONINI:4 in the last 72 hours No components found with this basename: POCBNP:3 No results found for this basename: DDIMER in the last 72 hours No results found for this basename: HGBA1C in the last 72 hours No results found for this basename: CHOL,HDL,LDLCALC,TRIG,CHOLHDL in the last 72 hours No results found for this basename: TSH,T4TOTAL,FREET3,T3FREE,THYROIDAB in the last 72 hours No results found for this basename:  VITAMINB12,FOLATE,FERRITIN,TIBC,IRON,RETICCTPCT in the last 72 hours  Medications: Scheduled    . calcium acetate  2,001 mg Oral With snacks  . calcium acetate  2,668 mg Oral TID WC  . cinacalcet  180 mg Oral Q2000  . doxercalciferol  4.5 mcg Intravenous Q M,W,F-HD  . heparin  5,000 Units Subcutaneous Q8H  . multivitamin  1 tablet Oral QHS  . piperacillin-tazobactam (ZOSYN)  IV  2.25 g Intravenous Q8H  . senna-docusate  1 tablet Oral BID  . vancomycin  1,000 mg Intravenous Q M,W,F-HD     Radiology/Studies:  Dg Chest 2 View  01/06/2013  *RADIOLOGY REPORT*  Clinical Data: Chest pain, chest tube  CHEST - 2 VIEW  Comparison: Prior chest x-ray 12/30/2012  Findings: Similar position of right-sided thoracostomy tubes.  The left IJ central venous catheter has been removed.  Unchanged appearance of residual pleural fluid versus pleural thickening and right apical pneumothorax.  Opacities in the right upper and mid lung are also similar to prior.  Stable mild cardiomegaly.  The left lung remains relatively well aerated.  IMPRESSION:  1.  Very similar appearance of the  right lung compared to 12/30/2012.  Persistent right apical pneumothorax (possibly ex vacuo with stuck lung) and residual right pleural fluid versus pleural thickening. 2.  Stable right upper and mid lung opacities. 3.  Stable mild cardiomegaly.   Original Report Authenticated By: Jacqulynn Cadet, M.D.    Chest tube: sites look ok, serosang drainage, + air leak persists     INR: Will add last result for INR, ABG once components are confirmed Will add last 4 CBG results once components are confirmed  Assessment/Plan: 1 cont CT's- may require furthur Vats to treat air leak 2 cont ABX/ medical management    LOS: 1 day    GOLD,WAYNE E 1/29/20149:39 AM    I have seen and examined the patient and agree with the assessment and plan as outlined.  Patient readmitted with fevers, leukocytosis and reportedly positive blood  cultures.  CXR unchanged since 1/21.  Incision looks okay.  I would not remove chest tubes at this time.  Zenia Guest H 01/07/2013 3:20 PM

## 2013-01-07 NOTE — Consult Note (Addendum)
INFECTIOUS DISEASE CONSULT NOTE  Date of Admission:  01/06/2013  Date of Consult:  01/07/2013  Reason for Consult: bacteremia (s aureus) Referring Physician: McTyre/Paya  Impression/Recommendation Bacteremia (s aureus, sensi pending) Would- check TEE Check CT chest to eval for empyema Repeat BCx to assess for clearance, done 01-06-13 Add Ancef. Stop zosyn Check ultrasound of his AV fistula. HIV (-) 12-11-12   Comment: Source of his bacteremia is unclear. Would like to evaluate him for sites of metastatic spread. He has not had back or neck pain. Certainly his previous pleural effusions and the presence of a chest tube for the last 19 days warrants further evaluation. His tenderness on his AV fistula also warrants evaluation.  Thank you so much for this interesting consult,   Bobby Rumpf [id number]  [name] [name] is an 52 y.o. male.  HPI: 52 yo M with hx of ESRD x 7 years, previous R pleural effusion (chest tube and pleural peel/decortication 12-19-12). He had fever at HD 1-27 and was sent to ED for eval. He was called back 1-28 when his BCx were positive. He returned to the hospital and was started on vanco/cefepime. His BCx have since been found to have S aureus.  Patient seen on dialysis. He is having rigors. He. The nurse notes that he did not have pain in his dialysis site until it was accessed. He was previously complaining of pain at his chest tube site.   Past Medical History  Diagnosis Date  . End-stage renal disease on hemodialysis   . Secondary hyperparathyroidism   . Hypertension   . Polysubstance abuse   . Anemia of chronic disease     BL Hgb ~10  . Tobacco abuse     Past Surgical History  Procedure Date  . Surgery for horseshoe kidney   . Colonoscopy 12/16/2012    Procedure: COLONOSCOPY;  Surgeon: Milus Banister, MD;  Location: [facility];  Service: Endoscopy;  Laterality: N/A;  . Video assisted thoracoscopy 12/18/2012    Procedure: VIDEO ASSISTED  THORACOSCOPY;  Surgeon: Melrose Nakayama, MD;  Location: [facility];  Service: Thoracic;  Laterality: Right;  pleural peel  . Pleural effusion drainage 12/18/2012    Procedure: DRAINAGE OF PLEURAL EFFUSION;  Surgeon: Melrose Nakayama, MD;  Location: [facility];  Service: Thoracic;  Laterality: Right;     No Known Allergies  Medications:  Scheduled:   . calcium acetate  2,001 mg Oral With snacks  . calcium acetate  2,668 mg Oral TID WC  . cinacalcet  180 mg Oral Q2000  . darbepoetin (ARANESP) injection - DIALYSIS  150 mcg Intravenous Q Fri-HD  . doxercalciferol  2 mcg Intravenous Q M,W,F-HD  . heparin  5,000 Units Subcutaneous Q8H  . multivitamin  1 tablet Oral QHS  . piperacillin-tazobactam (ZOSYN)  IV  2.25 g Intravenous Q8H  . senna-docusate  1 tablet Oral BID  . vancomycin  1,000 mg Intravenous Q M,W,F-HD    Total days of antibiotics: 2  Social History:  reports that he has been smoking Cigarettes.  He has a 10 pack-year smoking history. He does not have any smokeless tobacco history on file. He reports that he drinks alcohol. He reports that he uses illicit drugs.  Family History  Problem Relation Age of Onset  . Thyroid disease Mother   . Renal Disease      General ROS: Makes scant urine. P.m. have providers on Monday, January 27. He denies fevers. He has had normal bowel movements. Please  see history of present illness. He gives a limited history due to to him having rigors  Blood pressure 125/89, pulse 113, temperature 99 F (37.2 C), temperature source Oral, resp. rate 16, height 5\' 4"  (1.626 m), weight 65 kg (143 lb 4.8 oz), SpO2 93.00%. General appearance: alert, cooperative and moderate distress Eyes: negative findings: pupils equal, round, reactive to light and accomodation Throat: normal findings: oropharynx pink & moist without lesions or evidence of thrush Lungs: diminished breath sounds bilaterally Heart: Tachycardia Abdomen: normal findings: bowel sounds normal  and soft, non-tender Extremities: edema None and Right upper extremity AV fistula is tender distally. There is no increase in heat. He has tenderness at his chest tube site. Right lower axillary line. His chest tube drainage systems shows cloudy bloody fluid.   Results for orders placed during the hospital encounter of 01/06/13 (from the past 48 hour(s))  CBC WITH DIFFERENTIAL     Status: Abnormal   Collection Time   01/06/13 11:34 AM      Component Value Range Comment   WBC 13.6 (*) 4.0 - 10.5 K/uL    RBC 2.96 (*) 4.22 - 5.81 MIL/uL    Hemoglobin 8.7 (*) 13.0 - 17.0 g/dL    HCT 27.2 (*) 39.0 - 52.0 %    MCV 91.9  78.0 - 100.0 fL    MCH 29.4  26.0 - 34.0 pg    MCHC 32.0  30.0 - 36.0 g/dL    RDW 15.6 (*) 11.5 - 15.5 %    Platelets 176  150 - 400 K/uL    Neutrophils Relative 76  43 - 77 %    Lymphocytes Relative 10 (*) 12 - 46 %    Monocytes Relative 14 (*) 3 - 12 %    Eosinophils Relative 0  0 - 5 %    Basophils Relative 0  0 - 1 %    Neutro Abs 10.3 (*) 1.7 - 7.7 K/uL    Lymphs Abs 1.4  0.7 - 4.0 K/uL    Monocytes Absolute 1.9 (*) 0.1 - 1.0 K/uL    Eosinophils Absolute 0.0  0.0 - 0.7 K/uL    Basophils Absolute 0.0  0.0 - 0.1 K/uL    RBC Morphology TARGET CELLS     COMPREHENSIVE METABOLIC PANEL     Status: Abnormal   Collection Time   01/06/13 11:34 AM      Component Value Range Comment   Sodium 137  135 - 145 mEq/L    Potassium 5.9 (*) 3.5 - 5.1 mEq/L    Chloride 95 (*) 96 - 112 mEq/L    CO2 27  19 - 32 mEq/L    Glucose, Bld 90  70 - 99 mg/dL    BUN 61 (*) 6 - 23 mg/dL    Creatinine, Ser 14.14 (*) 0.50 - 1.35 mg/dL    Calcium 10.3  8.4 - 10.5 mg/dL    Total Protein 7.1  6.0 - 8.3 g/dL    Albumin 2.3 (*) 3.5 - 5.2 g/dL    AST 12  0 - 37 U/L    ALT 11  0 - 53 U/L    Alkaline Phosphatase 69  39 - 117 U/L    Total Bilirubin 0.5  0.3 - 1.2 mg/dL    GFR calc non Af Amer 4 (*) >90 mL/min    GFR calc Af Amer 4 (*) >90 mL/min   CULTURE, BLOOD (ROUTINE X 2)     Status: Normal  (Preliminary result)  Collection Time   01/06/13 11:34 AM      Component Value Range Comment   Specimen Description BLOOD ARM LEFT      Special Requests BOTTLES DRAWN AEROBIC AND ANAEROBIC 10CC      Culture  Setup Time 01/06/2013 18:01      Culture        Value:        BLOOD CULTURE RECEIVED NO GROWTH TO DATE CULTURE WILL BE HELD FOR 5 DAYS BEFORE ISSUING A FINAL NEGATIVE REPORT   Report Status PENDING     CULTURE, BLOOD (ROUTINE X 2)     Status: Normal (Preliminary result)   Collection Time   01/06/13 11:45 AM      Component Value Range Comment   Specimen Description BLOOD ARM LEFT      Special Requests BOTTLES DRAWN AEROBIC AND ANAEROBIC 10CC      Culture  Setup Time 01/06/2013 18:01      Culture        Value:        BLOOD CULTURE RECEIVED NO GROWTH TO DATE CULTURE WILL BE HELD FOR 5 DAYS BEFORE ISSUING A FINAL NEGATIVE REPORT   Report Status PENDING     CG4 I-STAT (LACTIC ACID)     Status: Normal   Collection Time   01/06/13 11:51 AM      Component Value Range Comment   Lactic Acid, Venous 1.37  0.5 - 2.2 mmol/L   CBC WITH DIFFERENTIAL     Status: Abnormal   Collection Time   01/06/13  2:07 PM      Component Value Range Comment   WBC 14.0 (*) 4.0 - 10.5 K/uL    RBC 3.04 (*) 4.22 - 5.81 MIL/uL    Hemoglobin 8.8 (*) 13.0 - 17.0 g/dL    HCT 28.1 (*) 39.0 - 52.0 %    MCV 92.4  78.0 - 100.0 fL    MCH 28.9  26.0 - 34.0 pg    MCHC 31.3  30.0 - 36.0 g/dL    RDW 15.8 (*) 11.5 - 15.5 %    Platelets 180  150 - 400 K/uL    Neutrophils Relative 76  43 - 77 %    Neutro Abs 10.7 (*) 1.7 - 7.7 K/uL    Lymphocytes Relative 13  12 - 46 %    Lymphs Abs 1.8  0.7 - 4.0 K/uL    Monocytes Relative 11  3 - 12 %    Monocytes Absolute 1.6 (*) 0.1 - 1.0 K/uL    Eosinophils Relative 0  0 - 5 %    Eosinophils Absolute 0.0  0.0 - 0.7 K/uL    Basophils Relative 0  0 - 1 %    Basophils Absolute 0.0  0.0 - 0.1 K/uL   COMPREHENSIVE METABOLIC PANEL     Status: Abnormal   Collection Time   01/06/13   2:07 PM      Component Value Range Comment   Sodium 135  135 - 145 mEq/L    Potassium 5.9 (*) 3.5 - 5.1 mEq/L    Chloride 93 (*) 96 - 112 mEq/L    CO2 26  19 - 32 mEq/L    Glucose, Bld 104 (*) 70 - 99 mg/dL    BUN 63 (*) 6 - 23 mg/dL    Creatinine, Ser 14.01 (*) 0.50 - 1.35 mg/dL    Calcium 10.2  8.4 - 10.5 mg/dL    Total Protein 7.2  6.0 - 8.3 g/dL  Albumin 2.2 (*) 3.5 - 5.2 g/dL    AST 13  0 - 37 U/L    ALT 11  0 - 53 U/L    Alkaline Phosphatase 70  39 - 117 U/L    Total Bilirubin 0.5  0.3 - 1.2 mg/dL    GFR calc non Af Amer 4 (*) >90 mL/min    GFR calc Af Amer 4 (*) >90 mL/min   GLUCOSE, CAPILLARY     Status: Abnormal   Collection Time   01/06/13  8:10 PM      Component Value Range Comment   Glucose-Capillary 100 (*) 70 - 99 mg/dL    Comment 1 Notify RN      Comment 2 Documented in Chart     BASIC METABOLIC PANEL     Status: Abnormal   Collection Time   01/06/13 11:30 PM      Component Value Range Comment   Sodium 137  135 - 145 mEq/L    Potassium 5.1  3.5 - 5.1 mEq/L    Chloride 94 (*) 96 - 112 mEq/L    CO2 27  19 - 32 mEq/L    Glucose, Bld 110 (*) 70 - 99 mg/dL    BUN 75 (*) 6 - 23 mg/dL    Creatinine, Ser 14.82 (*) 0.50 - 1.35 mg/dL    Calcium 10.0  8.4 - 10.5 mg/dL    GFR calc non Af Amer 3 (*) >90 mL/min    GFR calc Af Amer 4 (*) >90 mL/min   MRSA PCR SCREENING     Status: Normal   Collection Time   01/07/13 12:18 AM      Component Value Range Comment   MRSA by PCR NEGATIVE  NEGATIVE       Component Value Date/Time   SDES BLOOD ARM LEFT 01/06/2013 1145   SPECREQUEST BOTTLES DRAWN AEROBIC AND ANAEROBIC 10CC 01/06/2013 1145   CULT        BLOOD CULTURE RECEIVED NO GROWTH TO DATE CULTURE WILL BE HELD FOR 5 DAYS BEFORE ISSUING A FINAL NEGATIVE REPORT 01/06/2013 1145   REPTSTATUS PENDING 01/06/2013 1145   Dg Chest 2 View  01/06/2013  *RADIOLOGY REPORT*  Clinical Data: Chest pain, chest tube  CHEST - 2 VIEW  Comparison: Prior chest x-ray 12/30/2012  Findings: Similar  position of right-sided thoracostomy tubes.  The left IJ central venous catheter has been removed.  Unchanged appearance of residual pleural fluid versus pleural thickening and right apical pneumothorax.  Opacities in the right upper and mid lung are also similar to prior.  Stable mild cardiomegaly.  The left lung remains relatively well aerated.  IMPRESSION:  1.  Very similar appearance of the right lung compared to 12/30/2012.  Persistent right apical pneumothorax (possibly ex vacuo with stuck lung) and residual right pleural fluid versus pleural thickening. 2.  Stable right upper and mid lung opacities. 3.  Stable mild cardiomegaly.   Original Report Authenticated By: Jacqulynn Cadet, M.D.    Recent Results (from the past 240 hour(s))  CULTURE, BLOOD (ROUTINE X 2)     Status: Normal (Preliminary result)   Collection Time   01/06/13 11:34 AM      Component Value Range Status Comment   Specimen Description BLOOD ARM LEFT   Final    Special Requests BOTTLES DRAWN AEROBIC AND ANAEROBIC 10CC   Final    Culture  Setup Time 01/06/2013 18:01   Final    Culture     Final  Value:        BLOOD CULTURE RECEIVED NO GROWTH TO DATE CULTURE WILL BE HELD FOR 5 DAYS BEFORE ISSUING A FINAL NEGATIVE REPORT   Report Status PENDING   Incomplete   CULTURE, BLOOD (ROUTINE X 2)     Status: Normal (Preliminary result)   Collection Time   01/06/13 11:45 AM      Component Value Range Status Comment   Specimen Description BLOOD ARM LEFT   Final    Special Requests BOTTLES DRAWN AEROBIC AND ANAEROBIC 10CC   Final    Culture  Setup Time 01/06/2013 18:01   Final    Culture     Final    Value:        BLOOD CULTURE RECEIVED NO GROWTH TO DATE CULTURE WILL BE HELD FOR 5 DAYS BEFORE ISSUING A FINAL NEGATIVE REPORT   Report Status PENDING   Incomplete   MRSA PCR SCREENING     Status: Normal   Collection Time   01/07/13 12:18 AM      Component Value Range Status Comment   MRSA by PCR NEGATIVE  NEGATIVE Final        01/07/2013, 5:31 PM     LOS: 1 day   Staphylococcus aureus bacteremia (SAB) is associated with a high rate of complications and mortality.  Specific aspects of clinical management are critical to optimizing the outcome of patients with SAB.  Therefore, the [facility] Antimicrobial Management Team ([facility]) have initiated an intervention aimed at improving the management of SAB at [facility].  To do so, Infectious Diseases Consultants are providing evidence-based recommendations for the management of all patients with SAB.  The specific recommendations for this patient are marked "X" in this document.  Recommended [ x ]  Completed [date]  Perform follow-up blood cultures (even if the patient is afebrile) to ensure clearance of bacteremia.  Recommended [  ]  Completed [date]   Remove vascular catheter, and obtain follow-up cultures after removal of catheter  Recommended [  ]  Completed [date]   Perform echocardiography to evaluate for endocarditis (transthoracic ECHO is 40-50% sensitive, TEE is > 90% sensitive).*  Please keep in mind, that neither test can definitively EXCLUDE endocarditis, and that should clinical suspicion remain high for endocarditis the patient should then still be treated with an "endocarditis" duration of therapy = 6 weeks  Recommended [  ]  Completed [date]   Consult electrophysiologist to evaluate implanted cardiac device (pacemaker, ICD)  Recommended [  x]  Completed [date]   Ensure source control.  Have all abscesses been drained effectively? Have deep seeded infections (septic joints or osteomyelitis) had appropriate surgical debridement?  Recommended [ x ]  Completed [date]   Investigate for "metastatic" sites of infection.   Does the patient have ANY symptom or physical exam finding that would suggest a deeper infection (back or neck pain that may be suggestive of vertebral osteomyelitis or epidural abscess, muscle pain that could be a symptom of  pyomyositis)?  Keep in mind that for deep seeded infections MRI imaging with contrast is preferred rather than other often insensitive tests such as plain x-rays, especially early in a patient's presentation.   Recommended [  ]  Completed [date]  Change antibiotic regimen to __________________________.  (If on vancomycin, goal trough should be 15 - 20 mg/L)  [  ]  Estimated duration of IV  Antibiotic therapy:  2 to 6 weeks (dependent on diagnostic test results).    [  ]  Consult case management for probable prolonged outpatient intravenous antibiotic therapy.

## 2013-01-07 NOTE — H&P (Signed)
INTERNAL MEDICINE TEACHING SERVICE Attending Admission Note  Date: 01/07/2013  Patient name: [name] [name]  Medical record number: [id number]  Date of birth: 1961-02-26    I have seen and evaluated [name] [name] and discussed their care with the Residency Team.  52 year old male with a past medical history significant for end-stage renal disease, polysubstance abuse, history of status post VATS procedure for right-sided pleural effusion currently with chest tube still in place, presented due to fever and chills. He was recently seen in [facility] ED after being sent from dialysis due to fever, had blood cultures drawn and was given antibiotics to take at home. He received a call back in the emergency department about infection in his blood and was told to go to the emergency department. He states he has not had any increased shortness of breath or chest pain. He has had a recent admission for GI bleeding earlier in January for sure the time he had the above-stated cardiothoracic procedure. He states he has some pain around the area of the chest tubes. He denies any drainage from this area. He has one out of 2 blood cultures positive for gram-positive cocci in clusters. He has been started on vancomycin and cefepime. His been noted to have a temperature as high as 102.3. His hemodialysis site is nontender and not draining.  Physical Exam: Blood pressure 114/68, pulse 105, temperature 99.2 F (37.3 C), temperature source Oral, resp. rate 16, height 5\' 4"  (1.626 m), weight 143 lb 4.8 oz (65 kg), SpO2 97.00%.  General:  appears sleepy this morning. No apparent distress. States he had a sleepless night.   Head: Normocephalic, atraumatic.  Eyes: PERRL, EOMI, No signs of anemia or jaundince.  Nose: Mucous membranes moist, not inflammed, nonerythematous.  Throat: Oropharynx nonerythematous, no exudate appreciated.   Neck: No deformities, masses, or tenderness noted.Supple, No carotid Bruits, no JVD.    Lungs:   decreased breath sounds bilaterally. Right chest tubes in place without any surrounding erythema or drainage. Chest tube is draining some slight serosanguineous fluid.Marland Kitchen  Heart: RRR. S1 and S2 normal without gallop, murmur, or rubs.  Abdomen:  BS normoactive. Soft, Nondistended, non-tender.  No masses or organomegaly.  Extremities:  1+ bilateral pitting edema..  Neurologic: A&O X3, CN II - XII are grossly intact. Motor strength is 5/5 in the all 4 extremities, Sensations intact to light touch, Cerebellar signs negative.  Skin: No visible rashes, scars.    Lab results: Results for orders placed during the hospital encounter of 01/06/13 (from the past 24 hour(s))  GLUCOSE, CAPILLARY     Status: Abnormal   Collection Time   01/06/13  8:10 PM      Component Value Range   Glucose-Capillary 100 (*) 70 - 99 mg/dL   Comment 1 Notify RN     Comment 2 Documented in Chart    BASIC METABOLIC PANEL     Status: Abnormal   Collection Time   01/06/13 11:30 PM      Component Value Range   Sodium 137  135 - 145 mEq/L   Potassium 5.1  3.5 - 5.1 mEq/L   Chloride 94 (*) 96 - 112 mEq/L   CO2 27  19 - 32 mEq/L   Glucose, Bld 110 (*) 70 - 99 mg/dL   BUN 75 (*) 6 - 23 mg/dL   Creatinine, Ser 14.82 (*) 0.50 - 1.35 mg/dL   Calcium 10.0  8.4 - 10.5 mg/dL   GFR calc non Af Amer 3 (*) >  90 mL/min   GFR calc Af Amer 4 (*) >90 mL/min  MRSA PCR SCREENING     Status: Normal   Collection Time   01/07/13 12:18 AM      Component Value Range   MRSA by PCR NEGATIVE  NEGATIVE    Imaging results:  Dg Chest 2 View  01/06/2013  *RADIOLOGY REPORT*  Clinical Data: Chest pain, chest tube  CHEST - 2 VIEW  Comparison: Prior chest x-ray 12/30/2012  Findings: Similar position of right-sided thoracostomy tubes.  The left IJ central venous catheter has been removed.  Unchanged appearance of residual pleural fluid versus pleural thickening and right apical pneumothorax.  Opacities in the right upper and mid lung are also  similar to prior.  Stable mild cardiomegaly.  The left lung remains relatively well aerated.  IMPRESSION:  1.  Very similar appearance of the right lung compared to 12/30/2012.  Persistent right apical pneumothorax (possibly ex vacuo with stuck lung) and residual right pleural fluid versus pleural thickening. 2.  Stable right upper and mid lung opacities. 3.  Stable mild cardiomegaly.   Original Report Authenticated By: Jacqulynn Cadet, M.D.      Assessment and Plan: I agree with the formulated Assessment and Plan with the following changes:  52 year old male with a past medical history significant for end-stage renal disease, polysubstance abuse, history of status post VATS procedure for right-sided pleural effusion currently with chest tube still in place, presented due to fever and chills. #1 sepsis: At this time the patient is hemodynamically stable. There is still concern about thoracic origin of infection. We'll consult cardiovascular surgery. There is also the concern of underlying endocarditis. I will continue treatment with vancomycin and cefepime and follow blood cultures results. We'll consult infectious disease as well. #2 right apical pneumothorax: He has likely a persistent air leak. Should be evaluated by cardiothoracic surgery. #3 history of GI bleed: I would use heparin carefully in this patient for now: He should be kept on SCDs. Follow his complete blood count and monitor for any signs of bleeding.  Dominic Pea, DO 1/29/20142:42 PM

## 2013-01-07 NOTE — Progress Notes (Addendum)
Patient returned from HD , vital signs 109/63,0132,100.2,25,95 % on RA. Patient complaints of pain 8/10 by chest tube, said they looked at it in HD and pressed on it. Gave pt 1 mg IV Dilaudid per MD order. Will continue to monitor,Patient HR maintaining in the 130's called and spoke to MD on call, order to give 1 more 1mg  IV dilaudid for pain control. Will continue to monitor patient.

## 2013-01-07 NOTE — Care Management Note (Signed)
    Page 1 of 2   01/20/2013     10:34:16 AM   CARE MANAGEMENT NOTE 01/20/2013  Patient:  [name] [name], [name] [name]   Account Number:  [id number]  Date Initiated:  01/07/2013  Documentation initiated by:  Marvetta Gibbons  Subjective/Objective Assessment:   Pt admitted with sepsis + [facility]     Action/Plan:   PTA pt was at home with [facility] - active with Ar[facility] Go for HH-RN- has CT [facility]2 [facility]   Anticipated DC Date:  01/20/2013   Anticipated DC Plan:  [place]  CM consult      [facility] Choice  Resumption Of Svcs/PTA Provider   Choice offered to / List presented to:  C-1 Patient        [place] arranged  HH-1 RN      [place]   Status of service:  Completed, signed off Medicare Important Message given?   (If response is "NO", the following Medicare IM given date fields will be blank) Date Medicare IM given:   Date Additional Medicare IM given:    Discharge Disposition:  [place]  Per UR Regulation:  Reviewed for med. necessity/level of care/duration of stay  If discussed at [place] of Stay Meetings, dates discussed:   01/15/2013  01/20/2013    Comments:  01/20/13- 1020- Marvetta Gibbons RN, BSN [phone] Pt for d/c today with HH-RN for CT management- spoke with pt at bedside regarding [place] needs- pt was active with [facility] Go prior to admission- per conversation with pt he is agreeable to resuming HH with [facility] (does not have a preference as long as he gets to go home today) call made to [facility]- with [facility] Go for resumption of [facility] services- will go over proper way to drain [facility] with [place] using the closed system. [place] services to resume within 24-48 hrs post discharge.  01/07/13- [place] RN, BSN [phone] Pt active with [facility] services PTA- will need resumption orders at discharge- NCM to follow for d/c planning/needs.

## 2013-01-07 NOTE — Progress Notes (Addendum)
Subjective:    Patient states his R-sided thoracic pain is much improved with IV opioids. He denies any subjective fever or chills. Denies SOB. Complains of chest pain with cough.   Interval Events: No acute events. Febrile overnight to 102F.    Objective:    Vital Signs:   Temp:  [98.2 F (36.8 C)-102.3 F (39.1 C)] 99.3 F (37.4 C) (01/29 0808) Pulse Rate:  [100-130] 104  (01/29 0439) Resp:  [9-26] 22  (01/29 0439) BP: (91-134)/(51-75) 112/56 mmHg (01/29 0439) SpO2:  [90 %-100 %] 91 % (01/29 0439) Weight:  [143 lb 4.8 oz (65 kg)-220 lb 7.4 oz (100 kg)] 143 lb 4.8 oz (65 kg) (01/28 2259) Last BM Date: 01/06/13  24-hour weight change: Weight change:   Intake/Output:  No intake or output data in the 24 hours ending 01/07/13 0849    Physical Exam: General: Vital signs reviewed and noted. Well-developed, well-nourished, in no acute distress; alert, appropriate and cooperative throughout examination.  Lungs/chest:  Normal respiratory effort. Decreased breath sounds bilaterally. Chest tube with new dressing c/d/i.   Heart: RRR. S1 and S2 normal without gallop, murmur, or rubs.  Abdomen:  BS normoactive. Soft, Nondistended, non-tender.  Extremities: No pretibial edema. RUE HD access with thrill and no tenderness.      Labs:  Basic Metabolic Panel:  Lab [id number] 01/06/13 1407 01/06/13 1134  NA 137 135 137  K 5.1 5.9* 5.9*  CL 94* 93* 95*  CO2 27 26 27   GLUCOSE 110* 104* 90  BUN 75* 63* 61*  CREATININE 14.82* 14.01* 14.14*  CALCIUM 10.0 10.2 10.3  MG -- -- --  PHOS -- -- --    Liver Function Tests:  Lab 01/06/13 1407 01/06/13 1134  AST 13 12  ALT 11 11  ALKPHOS 70 69  BILITOT 0.5 0.5  PROT 7.2 7.1  ALBUMIN 2.2* 2.3*   CBC:  Lab 01/06/13 1407 01/06/13 1134  WBC 14.0* 13.6*  NEUTROABS 10.7* 10.3*  HGB 8.8* 8.7*  HCT 28.1* 27.2*  MCV 92.4 91.9  PLT 180 176   CBG:  Lab 01/06/13 2010  GLUCAP 100*    Microbiology: Results for orders placed  during the hospital encounter of 01/06/13  CULTURE, BLOOD (ROUTINE X 2)     Status: Normal (Preliminary result)   Collection Time   01/06/13 11:34 AM      Component Value Range Status Comment   Specimen Description BLOOD ARM LEFT   Final    Special Requests BOTTLES DRAWN AEROBIC AND ANAEROBIC 10CC   Final    Culture  Setup Time 01/06/2013 18:01   Final    Culture     Final    Value:        BLOOD CULTURE RECEIVED NO GROWTH TO DATE CULTURE WILL BE HELD FOR 5 DAYS BEFORE ISSUING A FINAL NEGATIVE REPORT   Report Status PENDING   Incomplete   CULTURE, BLOOD (ROUTINE X 2)     Status: Normal (Preliminary result)   Collection Time   01/06/13 11:45 AM      Component Value Range Status Comment   Specimen Description BLOOD ARM LEFT   Final    Special Requests BOTTLES DRAWN AEROBIC AND ANAEROBIC 10CC   Final    Culture  Setup Time 01/06/2013 18:01   Final    Culture     Final    Value:        BLOOD CULTURE RECEIVED NO GROWTH TO DATE CULTURE WILL BE HELD FOR  5 DAYS BEFORE ISSUING A FINAL NEGATIVE REPORT   Report Status PENDING   Incomplete   MRSA PCR SCREENING     Status: Normal   Collection Time   01/07/13 12:18 AM      Component Value Range Status Comment   MRSA by PCR NEGATIVE  NEGATIVE Final    Imaging: Dg Chest 2 View  01/06/2013  *RADIOLOGY REPORT*  Clinical Data: Chest pain, chest tube  CHEST - 2 VIEW  Comparison: Prior chest x-ray 12/30/2012  Findings: Similar position of right-sided thoracostomy tubes.  The left IJ central venous catheter has been removed.  Unchanged appearance of residual pleural fluid versus pleural thickening and right apical pneumothorax.  Opacities in the right upper and mid lung are also similar to prior.  Stable mild cardiomegaly.  The left lung remains relatively well aerated.  IMPRESSION:  1.  Very similar appearance of the right lung compared to 12/30/2012.  Persistent right apical pneumothorax (possibly ex vacuo with stuck lung) and residual right pleural fluid  versus pleural thickening. 2.  Stable right upper and mid lung opacities. 3.  Stable mild cardiomegaly.   Original Report Authenticated By: Jacqulynn Cadet, M.D.        Medications:    Infusions:    Scheduled Medications:    . calcium acetate  2,001 mg Oral With snacks  . calcium acetate  2,668 mg Oral TID WC  . cinacalcet  180 mg Oral Q2000  . doxercalciferol  4.5 mcg Intravenous Q M,W,F-HD  . heparin  5,000 Units Subcutaneous Q8H  . multivitamin  1 tablet Oral QHS  . piperacillin-tazobactam (ZOSYN)  IV  2.25 g Intravenous Q8H  . senna-docusate  1 tablet Oral BID  . vancomycin  1,000 mg Intravenous Q M,W,F-HD    PRN Medications: acetaminophen, HYDROmorphone (DILAUDID) injection, oxyCODONE-acetaminophen   Assessment/ Plan:   Pt is a 52 y.o. yo male with a PMHx of ESRD on HD, R-sided pleural effusion, who was admitted on 01/06/2013 with sepsis.   Sepsis - 3/4 SIRS criteria (fever, leukocytosis, tachycardia). Tmax = 102 overnight. Pt started on vanc and cefepime in the ED, which was changed to vanc and zosyn after admission. Source of infection is most likely the R thoracic chest tube site, as the patient has been very tender and painful in this area. LUE AVG is not tender today, thus lessening suspicion for infection at this site.  - cont vanc  - cont zosyn - f/u blood culture results  ESRD on HD - K = 5.1 this AM. No signs or symptoms consistent with an urgent or emergent indication for hemodialysis at this time.  - HD per nephrology   R-sided pleural effusion - patient has no previous electronic medical records at [facility] prior to 2014, however per chart review from previous encounters this year it appears the patient first had a right pleural effusion approximately 18 months ago, with thoracentesis at [facility]. On 12/11/2012, he was admitted for GI bleed and was noted to have recurrence of his right pleural effusion. He underwent thoracentesis per CVTS, which revealed an  exudative effusion, and subsequently underwent VATS, with the op note stating that the patient's right lung was encased in a thick fibrinous peel, requiring decortication. Patient was ultimately discharged on 12/30/2012 with chest tubes still in place. On admission, the patient's chest tubes are still in place and CXR reveals a stable right apical pneumothorax that persists since his previous hospitalization.  - consult CVTS   Recent GI Bleed -  no active bleeding. - cont daily CBCs  DVT PPX - heparin  CODE STATUS - full  CONSULTS PLACED - nephrology, CVTS, ID DISPO - Disposition is deferred at this time, awaiting improvement of fever/bacteremia.  Anticipated discharge in approximately 2-3 day(s).  The patient does have a current PCP (No primary provider on file.) and will not be requiring OPC follow-up after discharge.  The patient does not have transportation limitations that hinder transportation to clinic appointments. Services Needed at time of discharge: TO [place] Y = Yes, Blank = No  PT:    OT:    RN:    Equipment:    Other:         Length of Stay: 1 day(s)   Signed: Gae Gallop, MD  PGY-1, Internal Medicine Resident Pager: [phone] (7AM-5PM) 01/07/2013, 8:49 AM     Addendum: Updated culture results from [place] reveal that the patient's cultures from 01/05/2013 have 2/3 positive for staph aureus as of this AM. Further characterization and sensitives pending. ID consulted.

## 2013-01-07 NOTE — Progress Notes (Signed)
Utilization review completed.  

## 2013-01-08 ENCOUNTER — Ambulatory Visit: Payer: Medicare Other | Admitting: Thoracic Surgery (Cardiothoracic Vascular Surgery)

## 2013-01-08 ENCOUNTER — Inpatient Hospital Stay (HOSPITAL_COMMUNITY): Payer: Medicare Other

## 2013-01-08 DIAGNOSIS — M79609 Pain in unspecified limb: Secondary | ICD-10-CM

## 2013-01-08 DIAGNOSIS — I10 Essential (primary) hypertension: Secondary | ICD-10-CM

## 2013-01-08 LAB — CBC
HCT: 23.5 % — ABNORMAL LOW (ref 39.0–52.0)
MCH: 29.1 pg (ref 26.0–34.0)
MCHC: 32.8 g/dL (ref 30.0–36.0)
MCV: 88.7 fL (ref 78.0–100.0)
RDW: 15.7 % — ABNORMAL HIGH (ref 11.5–15.5)
WBC: 10.3 10*3/uL (ref 4.0–10.5)

## 2013-01-08 MED ORDER — ONDANSETRON HCL 4 MG/2ML IJ SOLN
INTRAMUSCULAR | Status: AC
  Filled 2013-01-08: qty 2

## 2013-01-08 MED ORDER — ONDANSETRON HCL 4 MG PO TABS: 4.0000 mg | ORAL_TABLET | Freq: Three times a day (TID) | ORAL | Status: DC | PRN

## 2013-01-08 MED ORDER — ONDANSETRON HCL 4 MG/2ML IJ SOLN
4.0000 mg | Freq: Four times a day (QID) | INTRAMUSCULAR | Status: DC | PRN
  Administered 2013-01-08: 4 mg via INTRAVENOUS
  Filled 2013-01-08 (×2): qty 2

## 2013-01-08 NOTE — Progress Notes (Signed)
  Subjective: Events noted.  Patient readmitted with fever. Apparently is growing staph aureus from blood cultures done at [facility].  He is having a lot of pain at the chest tube sites, but there is no obvious infection there.  He still has a large air leak    Objective: Vital signs in last 24 hours: Temp:  [97.5 F (36.4 C)-100.5 F (38.1 C)] 98.4 F (36.9 C) (01/30 1146) Pulse Rate:  [90-133] 90  (01/30 0400) Cardiac Rhythm:  [-] Normal sinus rhythm (01/29 2336) Resp:  [14-30] 23  (01/30 0400) BP: (100-139)/(59-101) 114/59 mmHg (01/30 1146) SpO2:  [93 %-98 %] 94 % (01/30 1146) Weight:  [212 lb 11.9 oz (96.5 kg)] 212 lb 11.9 oz (96.5 kg) (01/30 0400)  Hemodynamic parameters for last 24 hours:    Intake/Output from previous day: 01/29 0701 - 01/30 0700 In: 720 [P.O.:720] Out: 3216 [Stool:1; Chest Tube:190] Intake/Output this shift:    General appearance: alert and mild distress Heart: tachy, regular Lungs: audible air leak on right  Lab Results:  Basename 01/08/13 0900 01/06/13 1407  WBC 10.3 14.0*  HGB 7.7* 8.8*  HCT 23.5* 28.1*  PLT 198 180   BMET:  Basename 01/06/13 2330 01/06/13 1407  NA 137 135  K 5.1 5.9*  CL 94* 93*  CO2 27 26  GLUCOSE 110* 104*  BUN 75* 63*  CREATININE 14.82* 14.01*  CALCIUM 10.0 10.2    PT/INR: No results found for this basename: LABPROT,INR in the last 72 hours ABG    Component Value Date/Time   PHART 7.566* 12/19/2012 0535   HCO3 26.9* 12/19/2012 0535   TCO2 28 12/19/2012 0535   O2SAT 100.0 12/19/2012 0535   CBG (last 3)   Basename 01/06/13 2010  GLUCAP 100*    Assessment/Plan: S/P   - Prolonged air leak with both basilar and apical spaces. There are tube well positioned in each of the spaces, but the air leak is preventing the spaces from contracting. I think if we have to get the air leak to resolve to eliminate the space issues.  Options are redo thoracotomy and endobronchial valves. I don't know if the  endobronchial valves will be effective in his case, but I do think it is worth trying prior to committing him to a redo thoracotomy. I have d/w the patient and his wife the indications, risks, benefits and alternatives. They understand the general nature of the procedure, the possibility that it won't resolve the air leak and the need for another bronchoscopy in about 4 weeks to remove the valves. They understand this is a new procedure and the devices are not fully FDA approved. They understand the risks associated with bronchoscopy and valve placement as well as the risks of general anesthesia. He wishes to proceed.   LOS: 2 days    Markeshia Giebel C 01/08/2013

## 2013-01-08 NOTE — Progress Notes (Signed)
[address]            [place] [address]          [phone]          Subjective: Feels sore around chest tubes  Objective  Telemetry sinus rhythm/tachy   Temp:  [97.5 F (36.4 C)-100.5 F (38.1 C)] 99.2 F (37.3 C) (01/30 0757) Pulse Rate:  [90-133] 90  (01/30 0400) Resp:  [12-30] 23  (01/30 0400) BP: (100-139)/(62-101) 114/64 mmHg (01/30 0400) SpO2:  [93 %-99 %] 94 % (01/30 0400) Weight:  [212 lb 11.9 oz (96.5 kg)] 212 lb 11.9 oz (96.5 kg) (01/30 0400)   Intake/Output Summary (Last 24 hours) at 01/08/13 0800 Last data filed at 01/08/13 0757  Gross per 24 hour  Intake    720 ml  Output   3216 ml  Net  -2496 ml       General appearance: alert, cooperative, fatigued and mild distress Heart: regular rate and rhythm Lungs: sl dim in bases Wound: ok  Lab Results:  [facility] 01/06/13 2330 01/06/13 1407  NA 137 135  K 5.1 5.9*  CL 94* 93*  CO2 27 26  GLUCOSE 110* 104*  BUN 75* 63*  CREATININE 14.82* 14.01*  CALCIUM 10.0 10.2  MG -- --  PHOS -- --    Basename 01/06/13 1407 01/06/13 1134  AST 13 12  ALT 11 11  ALKPHOS 70 69  BILITOT 0.5 0.5  PROT 7.2 7.1  ALBUMIN 2.2* 2.3*   No results found for this basename: LIPASE:2,AMYLASE:2 in the last 72 hours  Basename 01/06/13 1407 01/06/13 1134  WBC 14.0* 13.6*  NEUTROABS 10.7* 10.3*  HGB 8.8* 8.7*  HCT 28.1* 27.2*  MCV 92.4 91.9  PLT 180 176   No results found for this basename: CKTOTAL:4,CKMB:4,TROPONINI:4 in the last 72 hours No components found with this basename: POCBNP:3 No results found for this basename: DDIMER in the last 72 hours No results found for this basename: HGBA1C in the last 72 hours No results found for this basename: CHOL,HDL,LDLCALC,TRIG,CHOLHDL in the last 72 hours No results found for this basename: TSH,T4TOTAL,FREET3,T3FREE,THYROIDAB in the last 72 hours No results found for this basename: VITAMINB12,FOLATE,FERRITIN,TIBC,IRON,RETICCTPCT in  the last 72 hours  Medications: Scheduled    . calcium acetate  2,001 mg Oral With snacks  . calcium acetate  2,668 mg Oral TID WC  .  ceFAZolin (ANCEF) IV  2 g Intravenous Q M,W,F-2000  . cinacalcet  180 mg Oral Q2000  . darbepoetin (ARANESP) injection - DIALYSIS  150 mcg Intravenous Q Fri-HD  . doxercalciferol  2 mcg Intravenous Q M,W,F-HD  . heparin  5,000 Units Subcutaneous Q8H  . multivitamin  1 tablet Oral QHS  . senna-docusate  1 tablet Oral BID  . vancomycin  1,000 mg Intravenous Q M,W,F-HD     Radiology/Studies:  Dg Chest 2 View  01/06/2013  *RADIOLOGY REPORT*  Clinical Data: Chest pain, chest tube  CHEST - 2 VIEW  Comparison: Prior chest x-ray 12/30/2012  Findings: Similar position of right-sided thoracostomy tubes.  The left IJ central venous catheter has been removed.  Unchanged appearance of residual pleural fluid versus pleural thickening and right apical pneumothorax.  Opacities in the right upper and mid lung are also similar to prior.  Stable mild cardiomegaly.  The left lung remains relatively well aerated.  IMPRESSION:  1.  Very similar appearance  of the right lung compared to 12/30/2012.  Persistent right apical pneumothorax (possibly ex vacuo with stuck lung) and residual right pleural fluid versus pleural thickening. 2.  Stable right upper and mid lung opacities. 3.  Stable mild cardiomegaly.   Original Report Authenticated By: Jacqulynn Cadet, M.D.     INR: Will add last result for INR, ABG once components are confirmed Will add last 4 CBG results once components are confirmed  Assessment/Plan: Feels sore but clinically stable. Chest tube with + air leak-persistent, keep tubes in place    LOS: 2 days    [name] [name] E 1/30/20148:00 AM

## 2013-01-08 NOTE — Progress Notes (Signed)
INFECTIOUS DISEASE PROGRESS NOTE  ID: [name] [name] is a 52 y.o. male with  Principal Problem:  *Bacteremia Active Problems:  End-stage renal disease on hemodialysis  Subjective: without complaints  Abtx:  Anti-infectives     Start     Dose/Rate Route Frequency Ordered Stop   01/07/13 2000   ceFAZolin (ANCEF) IVPB 2 g/50 mL premix        2 g 100 mL/hr over 30 Minutes Intravenous Every M-W-F (2000) 01/07/13 1837     01/07/13 1500   ceFEPIme (MAXIPIME) 500 mg in dextrose 5 % 50 mL IVPB  Status:  Discontinued        500 mg 100 mL/hr over 30 Minutes Intravenous Every 24 hours 01/06/13 1909 01/07/13 0815   01/07/13 1200   vancomycin (VANCOCIN) IVPB 1000 mg/200 mL premix        1,000 mg 200 mL/hr over 60 Minutes Intravenous Every M-W-F (Hemodialysis) 01/06/13 1909     01/07/13 0900   piperacillin-tazobactam (ZOSYN) IVPB 2.25 g  Status:  Discontinued        2.25 g 100 mL/hr over 30 Minutes Intravenous 3 times per day 01/07/13 0827 01/07/13 1754   01/06/13 1915   vancomycin (VANCOCIN) IVPB 1000 mg/200 mL premix        1,000 mg 200 mL/hr over 60 Minutes Intravenous  Once 01/06/13 1909 01/06/13 2019   01/06/13 1330   ceFEPIme (MAXIPIME) 2 g in dextrose 5 % 50 mL IVPB        2 g 100 mL/hr over 30 Minutes Intravenous  Once 01/06/13 1320 01/06/13 1520   01/06/13 1300   vancomycin (VANCOCIN) IVPB 1000 mg/200 mL premix        1,000 mg 200 mL/hr over 60 Minutes Intravenous  Once 01/06/13 1258 01/06/13 1426          Medications:  Scheduled:   . calcium acetate  2,001 mg Oral With snacks  . calcium acetate  2,668 mg Oral TID WC  .  ceFAZolin (ANCEF) IV  2 g Intravenous Q M,W,F-2000  . cinacalcet  180 mg Oral Q2000  . darbepoetin (ARANESP) injection - DIALYSIS  150 mcg Intravenous Q Fri-HD  . doxercalciferol  2 mcg Intravenous Q M,W,F-HD  . heparin  5,000 Units Subcutaneous Q8H  . multivitamin  1 tablet Oral QHS  . senna-docusate  1 tablet Oral BID  . vancomycin  1,000 mg  Intravenous Q M,W,F-HD    Objective: Vital signs in last 24 hours: Temp:  [97.5 F (36.4 C)-100.5 F (38.1 C)] 98.4 F (36.9 C) (01/30 1146) Pulse Rate:  [90-133] 90  (01/30 0400) Resp:  [14-30] 23  (01/30 0400) BP: (100-139)/(59-101) 114/59 mmHg (01/30 1146) SpO2:  [93 %-98 %] 94 % (01/30 1146) Weight:  [96.5 kg (212 lb 11.9 oz)] 96.5 kg (212 lb 11.9 oz) (01/30 0400)   General appearance: alert, cooperative and no distress Neck: venous distensin midline Resp: diminished breath sounds bibasilar Cardio: regular rate and rhythm GI: normal findings: bowel sounds normal and soft, non-tender  Lab Results  Basename 01/08/13 0900 01/06/13 2330 01/06/13 1407  WBC 10.3 -- 14.0*  HGB 7.7* -- 8.8*  HCT 23.5* -- 28.1*  NA -- 137 135  K -- 5.1 5.9*  CL -- 94* 93*  CO2 -- 27 26  BUN -- 75* 63*  CREATININE -- 14.82* 14.01*  GLU -- -- --   Liver Panel  Basename 01/06/13 1407 01/06/13 1134  PROT 7.2 7.1  ALBUMIN 2.2* 2.3*  AST 13 12  ALT 11 11  ALKPHOS 70 69  BILITOT 0.5 0.5  BILIDIR -- --  IBILI -- --   Sedimentation Rate No results found for this basename: ESRSEDRATE in the last 72 hours C-Reactive Protein No results found for this basename: CRP:2 in the last 72 hours  Microbiology: Recent Results (from the past 240 hour(s))  CULTURE, BLOOD (ROUTINE X 2)     Status: Normal (Preliminary result)   Collection Time   01/06/13 11:34 AM      Component Value Range Status Comment   Specimen Description BLOOD ARM LEFT   Final    Special Requests BOTTLES DRAWN AEROBIC AND ANAEROBIC 10CC   Final    Culture  Setup Time 01/06/2013 18:01   Final    Culture     Final    Value:        BLOOD CULTURE RECEIVED NO GROWTH TO DATE CULTURE WILL BE HELD FOR 5 DAYS BEFORE ISSUING A FINAL NEGATIVE REPORT   Report Status PENDING   Incomplete   CULTURE, BLOOD (ROUTINE X 2)     Status: Normal (Preliminary result)   Collection Time   01/06/13 11:45 AM      Component Value Range Status Comment    Specimen Description BLOOD ARM LEFT   Final    Special Requests BOTTLES DRAWN AEROBIC AND ANAEROBIC 10CC   Final    Culture  Setup Time 01/06/2013 18:01   Final    Culture     Final    Value:        BLOOD CULTURE RECEIVED NO GROWTH TO DATE CULTURE WILL BE HELD FOR 5 DAYS BEFORE ISSUING A FINAL NEGATIVE REPORT   Report Status PENDING   Incomplete   MRSA PCR SCREENING     Status: Normal   Collection Time   01/07/13 12:18 AM      Component Value Range Status Comment   MRSA by PCR NEGATIVE  NEGATIVE Final   CULTURE, BLOOD (SINGLE)     Status: Normal (Preliminary result)   Collection Time   01/07/13  2:40 AM      Component Value Range Status Comment   Specimen Description BLOOD LEFT HAND   Final    Special Requests BOTTLES DRAWN AEROBIC ONLY [facility]   Final    Culture  Setup Time 01/07/2013 09:19   Final    Culture     Final    Value:        BLOOD CULTURE RECEIVED NO GROWTH TO DATE CULTURE WILL BE HELD FOR 5 DAYS BEFORE ISSUING A FINAL NEGATIVE REPORT   Report Status PENDING   Incomplete     Studies/Results: Ct Chest Wo Contrast  01/08/2013  *RADIOLOGY REPORT*  Clinical Data: Right-sided chest pain.  Possible empyema.  Fever and chills.  CT CHEST WITHOUT CONTRAST  Technique:  Multidetector CT imaging of the chest was performed following the standard protocol without IV contrast.  Comparison: Chest CT 12/12/2012.  Findings:  Mediastinum: Heart size is borderline enlarged. A small amount of pericardial fluid and/or thickening, unlikely to be of hemodynamic significance.  No associated pericardial calcification. There is atherosclerosis of the thoracic aorta, the great vessels of the mediastinum and the coronary arteries, including calcified atherosclerotic plaque in the left main, left anterior descending and right coronary arteries. Numerous borderline enlarged and mildly enlarged mediastinal lymph nodes, largest of which is in the superior mediastinum immediately lateral to the origin of the innominate  artery measuring 1.4 cm in short  axis, slightly increased compared to prior examinations.  Esophagus is unremarkable in appearance. 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  There is also some calcification more inferiorly within the superior vena cava at the level of the superior cavoatrial junction.  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  Lungs/Pleura: Compared to the prior examination there has been interval placement of two right-sided chest tubes, one with the tip in the posterior right costophrenic sulcus and the other with the tip directed superiorly into the right apex.  The previously noted right-sided pleural effusion is nearly completely drained, although there is some persistent gas and fluid layering dependently in the right hemithorax.  The right lung has failed to adequately expand, suggesting pneumothorax ex vacuo.  Again noted are multiple pleural based mass like opacities and architectural distortion in the right lung, favored to represent areas of rounded atelectasis.  Profound thickening of the pleura throughout the right hemithorax is again noted.  Trace left pleural effusion layering dependently.  Left lung is clear.  Left lung is clear and well aerated.  Upper Abdomen: Previously mentioned chest wall collaterals extend into the upper abdomen and are very large.  Musculoskeletal: There are no aggressive appearing lytic or blastic lesions noted in the visualized portions of the skeleton.  IMPRESSION:  1.  Interval placement of two right-sided chest tubes with near complete drainage of the right pleural effusion compared to the prior study.  A small amount of residual dependent gas and fluid is noted in the right  hemithorax. 2.  The right lung has failed to adequately re-expand, most compatible with pneumothorax ex vacuo. 3.  Extensive right-sided pleural thickening is similar to the prior examination, and there is extensive architectural distortion and pleural based mass like opacities in the right lung which are favored to represent a large areas of rounded atelectasis. 4.  Calcification in this extending from the right internal jugular vein into the superior vena cava favored to represent chronically calcified fibrin sheath and/or chronically calcified clot. Associated with this there are numerous venous collaterals throughout the chest wall and upper abdomen bilaterally.  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  Clinical correlation for signs and symptoms of the SVC syndrome is recommended. 5.  Trace left pleural effusion layering dependently is simple in appearance.   Original Report Authenticated By: Vinnie Langton, M.D.      Assessment/Plan: S aureus bacteremia (sensi still pending at [facility]) Chest tube, previous pleural peel- now small amt of gas/fluid on R SVC occlusion? ESRD  Total days of antibiotics: 3 (vanco/ancef) No change in anbx CVTS/VVS comment on his SVC occlusion? U/s of AVG?  Needs TEE (TTE is only 40-60 sensitive)         Bobby Rumpf Infectious Diseases [id number] 01/08/2013, 1:36 PM   LOS: 2 days

## 2013-01-08 NOTE — Progress Notes (Signed)
Subjective:  Had HD yesterday, removed 3 liters tolerated well. Fever curve seems to be decreasing, Tmax 100.5 last night.  He denies issues with HD. "I wish you all would leave me alone" - noting pain with examination of chest tube area Objective Vital signs in last 24 hours: Filed Vitals:   01/07/13 2000 01/07/13 2336 01/08/13 0400 01/08/13 0757  BP: 104/64 111/66 114/64   Pulse: 128 98 90   Temp: 100.5 F (38.1 C) 98.4 F (36.9 C) 97.5 F (36.4 C) 99.2 F (37.3 C)  TempSrc: Oral Oral Oral Oral  Resp: 22 15 23    Height:      Weight:   96.5 kg (212 lb 11.9 oz)   SpO2: 98% 93% 94%    Weight change: -3.5 kg (-7 lb 11.5 oz)  Intake/Output Summary (Last 24 hours) at 01/08/13 0811 Last data filed at 01/08/13 0757  Gross per 24 hour  Intake    720 ml  Output   3216 ml  Net  -2496 ml   Labs: Basic Metabolic Panel:  Lab [id number] 2330 01/06/13 1407 01/06/13 1134  NA 137 135 137  K 5.1 5.9* 5.9*  CL 94* 93* 95*  CO2 27 26 27   GLUCOSE 110* 104* 90  BUN 75* 63* 61*  CREATININE 14.82* 14.01* 14.14*  CALCIUM 10.0 10.2 10.3  ALB -- -- --  PHOS -- -- --   Liver Function Tests:  Lab 01/06/13 1407 01/06/13 1134  AST 13 12  ALT 11 11  ALKPHOS 70 69  BILITOT 0.5 0.5  PROT 7.2 7.1  ALBUMIN 2.2* 2.3*   No results found for this basename: LIPASE:3,AMYLASE:3 in the last 168 hours No results found for this basename: AMMONIA:3 in the last 168 hours CBC:  Lab 01/06/13 1407 01/06/13 1134  WBC 14.0* 13.6*  NEUTROABS 10.7* 10.3*  HGB 8.8* 8.7*  HCT 28.1* 27.2*  MCV 92.4 91.9  PLT 180 176   Cardiac Enzymes: No results found for this basename: CKTOTAL:5,CKMB:5,CKMBINDEX:5,TROPONINI:5 in the last 168 hours CBG:  Lab 01/06/13 2010  GLUCAP 100*    Iron Studies: No results found for this basename: IRON,TIBC,TRANSFERRIN,FERRITIN in the last 72 hours Studies/Results: Dg Chest 2 View  01/06/2013  *RADIOLOGY REPORT*  Clinical Data: Chest pain, chest tube  CHEST - 2 VIEW   Comparison: Prior chest x-ray 12/30/2012  Findings: Similar position of right-sided thoracostomy tubes.  The left IJ central venous catheter has been removed.  Unchanged appearance of residual pleural fluid versus pleural thickening and right apical pneumothorax.  Opacities in the right upper and mid lung are also similar to prior.  Stable mild cardiomegaly.  The left lung remains relatively well aerated.  IMPRESSION:  1.  Very similar appearance of the right lung compared to 12/30/2012.  Persistent right apical pneumothorax (possibly ex vacuo with stuck lung) and residual right pleural fluid versus pleural thickening. 2.  Stable right upper and mid lung opacities. 3.  Stable mild cardiomegaly.   Original Report Authenticated By: Jacqulynn Cadet, M.D.    Medications: Infusions:    Scheduled Medications:    . calcium acetate  2,001 mg Oral With snacks  . calcium acetate  2,668 mg Oral TID WC  .  ceFAZolin (ANCEF) IV  2 g Intravenous Q M,W,F-2000  . cinacalcet  180 mg Oral Q2000  . darbepoetin (ARANESP) injection - DIALYSIS  150 mcg Intravenous Q Fri-HD  . doxercalciferol  2 mcg Intravenous Q M,W,F-HD  . heparin  5,000 Units Subcutaneous Q8H  .  multivitamin  1 tablet Oral QHS  . senna-docusate  1 tablet Oral BID  . vancomycin  1,000 mg Intravenous Q M,W,F-HD    have reviewed scheduled and prn medications.  Physical Exam: General: alert, seems to be in pain Heart: tachy Lungs: poor effort, due to pain Abdomen: soft, non tender Extremities: no appreciable edema Dialysis Access: Right upper AVG   I Assessment/ Plan: Pt is a 52 y.o. yo male with ESRD who was admitted on 01/06/2013 with fever and bacteremia  Assessment/Plan:  1. 1 Fever- with bacteremia- AVG does not appear to be infected by exam or symptoms. Sometimes you can get a transient bacteremia in dialysis that usually responds well to IV antibiotics. He did seem to get coverage pretty promptly but remained febrile. The source  for infection that I am most interested in is this chronic indwelling CT, true it does not show signs of obvious infection but needs to be stongly considered the source of this fever and bacteremia. I agree with broad spectrum abx for now.  Fever curve does appear to be improving. I will call the kidney center later for any further culture data. Consider echo to look for endocarditis.  2 ESRD: Will continue MWF schedule via AVG.   Normally runs 3 hours and 15 min that may not be adequate. I have ordered 3 and a half hours today. HD normally at [place].  3 Hypertension: Actually a little on the low side ?sepsis? Normally on metoprolol, currently being held  4. 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 Will hold iv iron in the setting of bacteremia  5. Metabolic Bone Disease: continuing  home meds of phoslo and sensipar, as well as IV hectorol with HD     Kamyiah Colantonio A   01/08/2013,8:11 AM  LOS: 2 days

## 2013-01-08 NOTE — Progress Notes (Signed)
Right:  DVT noted in the jugular vein.  The graft appears patent.

## 2013-01-08 NOTE — Progress Notes (Signed)
  Echocardiogram 2D Echocardiogram has been performed.  Diamond Nickel 01/08/2013, 3:40 PM

## 2013-01-08 NOTE — Progress Notes (Signed)
Subjective:    Patient complains of R-sided chest pain around chest tube sites, unchanged from yesterday. Complains of fever/chills overnight.   Interval Events: No acute events. Febrile to 100.5 overnight.    Objective:    Vital Signs:   Temp:  [97.5 F (36.4 C)-100.5 F (38.1 C)] 99.2 F (37.3 C) (01/30 0757) Pulse Rate:  [90-133] 90  (01/30 0400) Resp:  [12-30] 23  (01/30 0400) BP: (100-139)/(63-101) 114/64 mmHg (01/30 0400) SpO2:  [93 %-98 %] 94 % (01/30 0400) Weight:  [212 lb 11.9 oz (96.5 kg)] 212 lb 11.9 oz (96.5 kg) (01/30 0400) Last BM Date: 01/06/13  24-hour weight change: Weight change: -7 lb 11.5 oz (-3.5 kg)  Intake/Output:   Intake/Output Summary (Last 24 hours) at 01/08/13 0823 Last data filed at 01/08/13 0757  Gross per 24 hour  Intake    720 ml  Output   3216 ml  Net  -2496 ml      Physical Exam: General:  Vital signs reviewed and noted. Well-developed, well-nourished, in no acute distress; alert, appropriate and cooperative throughout examination.   Lungs/chest:  Normal respiratory effort. Decreased breath sounds bilaterally. Chest tube area still tender, with no drainage around tubes this AM.   Heart:  RRR. S1 and S2 normal without gallop, murmur, or rubs.   Abdomen:  BS normoactive. Soft, Nondistended, non-tender.   Extremities:  No pretibial edema. RUE HD access with thrill and no tenderness.      Labs:  Basic Metabolic Panel:  Lab [id number] 2330 01/06/13 1407 01/06/13 1134  NA 137 135 137  K 5.1 5.9* 5.9*  CL 94* 93* 95*  CO2 27 26 27   GLUCOSE 110* 104* 90  BUN 75* 63* 61*  CREATININE 14.82* 14.01* 14.14*  CALCIUM 10.0 10.2 10.3  MG -- -- --  PHOS -- -- --    Liver Function Tests:  Lab 01/06/13 1407 01/06/13 1134  AST 13 12  ALT 11 11  ALKPHOS 70 69  BILITOT 0.5 0.5  PROT 7.2 7.1  ALBUMIN 2.2* 2.3*   CBC:  Lab 01/06/13 1407 01/06/13 1134  WBC 14.0* 13.6*  NEUTROABS 10.7* 10.3*  HGB 8.8* 8.7*  HCT 28.1* 27.2*  MCV  92.4 91.9  PLT 180 176    CBG:  Lab 01/06/13 2010  GLUCAP 100*    Microbiology: Results for orders placed during the hospital encounter of 01/06/13  CULTURE, BLOOD (ROUTINE X 2)     Status: Normal (Preliminary result)   Collection Time   01/06/13 11:34 AM      Component Value Range Status Comment   Specimen Description BLOOD ARM LEFT   Final    Special Requests BOTTLES DRAWN AEROBIC AND ANAEROBIC 10CC   Final    Culture  Setup Time 01/06/2013 18:01   Final    Culture     Final    Value:        BLOOD CULTURE RECEIVED NO GROWTH TO DATE CULTURE WILL BE HELD FOR 5 DAYS BEFORE ISSUING A FINAL NEGATIVE REPORT   Report Status PENDING   Incomplete   CULTURE, BLOOD (ROUTINE X 2)     Status: Normal (Preliminary result)   Collection Time   01/06/13 11:45 AM      Component Value Range Status Comment   Specimen Description BLOOD ARM LEFT   Final    Special Requests BOTTLES DRAWN AEROBIC AND ANAEROBIC 10CC   Final    Culture  Setup Time 01/06/2013 18:01   Final  Culture     Final    Value:        BLOOD CULTURE RECEIVED NO GROWTH TO DATE CULTURE WILL BE HELD FOR 5 DAYS BEFORE ISSUING A FINAL NEGATIVE REPORT   Report Status PENDING   Incomplete   MRSA PCR SCREENING     Status: Normal   Collection Time   01/07/13 12:18 AM      Component Value Range Status Comment   MRSA by PCR NEGATIVE  NEGATIVE Final   CULTURE, BLOOD (SINGLE)     Status: Normal (Preliminary result)   Collection Time   01/07/13  2:40 AM      Component Value Range Status Comment   Specimen Description BLOOD LEFT HAND   Final    Special Requests BOTTLES DRAWN AEROBIC ONLY [facility]   Final    Culture  Setup Time 01/07/2013 09:19   Final    Culture     Final    Value:        BLOOD CULTURE RECEIVED NO GROWTH TO DATE CULTURE WILL BE HELD FOR 5 DAYS BEFORE ISSUING A FINAL NEGATIVE REPORT   Report Status PENDING   Incomplete     Coagulation Studies: No results found for this basename: LABPROT:5,INR:5 in the last 72  hours  Imaging: Dg Chest 2 View  01/06/2013  *RADIOLOGY REPORT*  Clinical Data: Chest pain, chest tube  CHEST - 2 VIEW  Comparison: Prior chest x-ray 12/30/2012  Findings: Similar position of right-sided thoracostomy tubes.  The left IJ central venous catheter has been removed.  Unchanged appearance of residual pleural fluid versus pleural thickening and right apical pneumothorax.  Opacities in the right upper and mid lung are also similar to prior.  Stable mild cardiomegaly.  The left lung remains relatively well aerated.  IMPRESSION:  1.  Very similar appearance of the right lung compared to 12/30/2012.  Persistent right apical pneumothorax (possibly ex vacuo with stuck lung) and residual right pleural fluid versus pleural thickening. 2.  Stable right upper and mid lung opacities. 3.  Stable mild cardiomegaly.   Original Report Authenticated By: Jacqulynn Cadet, M.D.        Medications:    Infusions:    Scheduled Medications:    . calcium acetate  2,001 mg Oral With snacks  . calcium acetate  2,668 mg Oral TID WC  .  ceFAZolin (ANCEF) IV  2 g Intravenous Q M,W,F-2000  . cinacalcet  180 mg Oral Q2000  . darbepoetin (ARANESP) injection - DIALYSIS  150 mcg Intravenous Q Fri-HD  . doxercalciferol  2 mcg Intravenous Q M,W,F-HD  . heparin  5,000 Units Subcutaneous Q8H  . multivitamin  1 tablet Oral QHS  . senna-docusate  1 tablet Oral BID  . vancomycin  1,000 mg Intravenous Q M,W,F-HD    PRN Medications: sodium chloride, sodium chloride, acetaminophen, feeding supplement (NEPRO CARB STEADY), heparin, heparin, lidocaine, lidocaine-prilocaine, oxyCODONE-acetaminophen, pentafluoroprop-tetrafluoroeth   Assessment/ Plan:   Pt is a 52 y.o. yo male with a PMHx of ESRD on HD, R-sided pleural effusion, who was admitted on 01/06/2013 with sepsis.   Sepsis - 3/4 SIRS criteria (fever, leukocytosis, tachycardia). Tmax = 100.5 overnight. Pt started on vanc and cefepime in the ED, which was changed  to vanc and zosyn after admission. Source of infection is most likely the R thoracic chest tube site, as the patient has been very tender and painful in this area. Pt seen by ID, who recommend replacing zosyn with ancef. ID also recommending CT chest  to evaluate for empyema, and u/s of RUE to evaluate pt's AVG as possible infectious source. - cont vanc  - d/c zosyn, start ancef - CT chest - u/s RUE AVG - consider TEE - consider consult to cards for eval of pacemaker  - f/u blood culture results   ESRD on HD - K = 5.1. Underwent HD yesterday with 3L removed.  - HD per nephrology.  R-sided pleural effusion - CVTS consulted, and recommend keeping chest tubes in place for now due to persistent air leak.   Recent GI Bleed - no active bleeding.  - cont daily CBCs   DVT PPX - heparin  CODE STATUS - full  CONSULTS PLACED - nephrology, CVTS, ID  DISPO - Disposition is deferred at this time, awaiting improvement of fever/bacteremia.  Anticipated discharge in approximately 2-3 day(s).  The patient does have a current PCP (No primary provider on file.) and will not be requiring OPC follow-up after discharge.  The patient does not have transportation limitations that hinder transportation to clinic appointments.  Services Needed at time of discharge: TO [place] [facility] = Yes, Blank = No  PT:    OT:    RN:    Equipment:    Other:        Length of Stay: 2 day(s)   Signed: Gae Gallop, MD  PGY-1, Internal Medicine Resident Pager: [phone] (7AM-5PM) 01/08/2013, 8:23 AM

## 2013-01-08 NOTE — Progress Notes (Signed)
INTERNAL MEDICINE TEACHING SERVICE Attending Note  Date: 01/08/2013  Patient name: [name] [name]  Medical record number: [id number]  Date of birth: [birth year]    This patient has been seen and discussed with the house staff. Please see their note for complete details. I concur with their findings with the following additions/corrections  He is noted to have a small air leak. He was febrile to 100.5 overnight. He states he has some right-sided pain at the chest tube sites. At this time he is being adequately treated with vancomycin and Ancef. Appreciate infectious disease input. At this time source is unclear. We will be following culture data from [place]. At this time it is reasonable to order a TTE, but he may ultimately need a TEE if there are no findings and our suspicion is high for endocarditis. This morning he was hesitant to undergo dialysis, we explained to him the need to catch up on this time. He is still hesitant to undergo dialysis. He is currently hemodynamically stable.  Dominic Pea, DO  01/08/2013, 11:38 AM

## 2013-01-09 ENCOUNTER — Inpatient Hospital Stay (HOSPITAL_COMMUNITY): Payer: Medicare Other | Admitting: Certified Registered"

## 2013-01-09 ENCOUNTER — Inpatient Hospital Stay (HOSPITAL_COMMUNITY): Payer: Medicare Other

## 2013-01-09 ENCOUNTER — Encounter (HOSPITAL_COMMUNITY)
Admission: EM | Disposition: A | Discharge status: Home / Self Care | Payer: Self-pay | Source: Home / Self Care | Attending: Thoracic Surgery (Cardiothoracic Vascular Surgery)

## 2013-01-09 ENCOUNTER — Encounter (HOSPITAL_COMMUNITY): Payer: Self-pay | Admitting: Certified Registered"

## 2013-01-09 DIAGNOSIS — J930 Spontaneous tension pneumothorax: Secondary | ICD-10-CM

## 2013-01-09 HISTORY — PX: VIDEO BRONCHOSCOPY WITH INSERTION OF INTERBRONCHIAL VALVE (IBV): SHX6178

## 2013-01-09 LAB — CBC
Hemoglobin: 7.6 g/dL — ABNORMAL LOW (ref 13.0–17.0)
RBC: 2.63 MIL/uL — ABNORMAL LOW (ref 4.22–5.81)

## 2013-01-09 LAB — RENAL FUNCTION PANEL
CO2: 28 mEq/L (ref 19–32)
GFR calc Af Amer: 5 mL/min — ABNORMAL LOW (ref >90)
Glucose, Bld: 89 mg/dL (ref 70–99)
Potassium: 4.6 mEq/L (ref 3.5–5.1)
Sodium: 137 mEq/L (ref 135–145)

## 2013-01-09 LAB — CULTURE, BLOOD (SINGLE)

## 2013-01-09 SURGERY — BRONCHOSCOPY, FLEXIBLE, WITH INTRABRONCHIAL VALVE INSERTION
Anesthesia: General | Site: Mouth | Wound class: Clean Contaminated

## 2013-01-09 MED ORDER — DOXERCALCIFEROL 4 MCG/2ML IV SOLN
INTRAVENOUS | Status: AC
  Administered 2013-01-09: 2 ug via INTRAVENOUS
  Filled 2013-01-09: qty 2

## 2013-01-09 MED ORDER — LIDOCAINE HCL (PF) 1 % IJ SOLN: 5.0000 mL | INTRAMUSCULAR | Status: DC | PRN

## 2013-01-09 MED ORDER — LIDOCAINE-PRILOCAINE 2.5-2.5 % EX CREA: 1.0000 "application " | TOPICAL_CREAM | CUTANEOUS | Status: DC | PRN

## 2013-01-09 MED ORDER — MEPERIDINE HCL 25 MG/ML IJ SOLN: 6.2500 mg | INTRAMUSCULAR | Status: DC | PRN

## 2013-01-09 MED ORDER — HEPARIN SODIUM (PORCINE) 1000 UNIT/ML DIALYSIS: 20.0000 [IU]/kg | INTRAMUSCULAR | Status: DC | PRN

## 2013-01-09 MED ORDER — MIDAZOLAM HCL 5 MG/5ML IJ SOLN
INTRAMUSCULAR | Status: DC | PRN
  Administered 2013-01-09: 2 mg via INTRAVENOUS

## 2013-01-09 MED ORDER — SODIUM CHLORIDE 0.9 % IV SOLN
INTRAVENOUS | Status: DC
  Administered 2013-01-09: 11:00:00 via INTRAVENOUS

## 2013-01-09 MED ORDER — SUCCINYLCHOLINE CHLORIDE 20 MG/ML IJ SOLN
INTRAMUSCULAR | Status: DC | PRN
  Administered 2013-01-09: 100 mg via INTRAVENOUS

## 2013-01-09 MED ORDER — ONDANSETRON HCL 4 MG/2ML IJ SOLN
INTRAMUSCULAR | Status: DC | PRN
  Administered 2013-01-09: 4 mg via INTRAVENOUS

## 2013-01-09 MED ORDER — PENTAFLUOROPROP-TETRAFLUOROETH EX AERO: 1.0000 "application " | INHALATION_SPRAY | CUTANEOUS | Status: DC | PRN

## 2013-01-09 MED ORDER — PHENYLEPHRINE HCL 10 MG/ML IJ SOLN
INTRAMUSCULAR | Status: DC | PRN
  Administered 2013-01-09: 80 ug via INTRAVENOUS
  Administered 2013-01-09: 40 ug via INTRAVENOUS
  Administered 2013-01-09 (×2): 80 ug via INTRAVENOUS
  Administered 2013-01-09: 40 ug via INTRAVENOUS
  Administered 2013-01-09: 80 ug via INTRAVENOUS

## 2013-01-09 MED ORDER — 0.9 % SODIUM CHLORIDE (POUR BTL) OPTIME
TOPICAL | Status: DC | PRN
  Administered 2013-01-09: 1000 mL

## 2013-01-09 MED ORDER — HYDROMORPHONE HCL PF 1 MG/ML IJ SOLN
INTRAMUSCULAR | Status: AC
  Filled 2013-01-09: qty 1

## 2013-01-09 MED ORDER — ACETAMINOPHEN 325 MG PO TABS
ORAL_TABLET | ORAL | Status: AC
  Administered 2013-01-09: 650 mg via ORAL
  Filled 2013-01-09: qty 2

## 2013-01-09 MED ORDER — SODIUM CHLORIDE 0.9 % IV SOLN: 100.0000 mL | INTRAVENOUS | Status: DC | PRN

## 2013-01-09 MED ORDER — DARBEPOETIN ALFA-POLYSORBATE 150 MCG/0.3ML IJ SOLN
INTRAMUSCULAR | Status: AC
  Administered 2013-01-09: 150 ug via INTRAVENOUS
  Filled 2013-01-09: qty 0.3

## 2013-01-09 MED ORDER — HYDROMORPHONE HCL PF 1 MG/ML IJ SOLN
0.2500 mg | INTRAMUSCULAR | Status: DC | PRN
  Administered 2013-01-09: 0.5 mg via INTRAVENOUS

## 2013-01-09 MED ORDER — FENTANYL CITRATE 0.05 MG/ML IJ SOLN
INTRAMUSCULAR | Status: DC | PRN
  Administered 2013-01-09: 100 ug via INTRAVENOUS
  Administered 2013-01-09 (×2): 50 ug via INTRAVENOUS

## 2013-01-09 MED ORDER — METOPROLOL TARTRATE 25 MG PO TABS
25.0000 mg | ORAL_TABLET | Freq: Two times a day (BID) | ORAL | Status: DC
  Administered 2013-01-09 – 2013-01-20 (×18): 25 mg via ORAL
  Filled 2013-01-09 (×23): qty 1

## 2013-01-09 MED ORDER — ACETAMINOPHEN 325 MG PO TABS
650.0000 mg | ORAL_TABLET | Freq: Four times a day (QID) | ORAL | Status: DC | PRN
  Administered 2013-01-09: 650 mg via ORAL

## 2013-01-09 MED ORDER — ONDANSETRON HCL 4 MG/2ML IJ SOLN: 4.0000 mg | Freq: Once | INTRAMUSCULAR | Status: AC | PRN

## 2013-01-09 MED ORDER — NEPRO/CARBSTEADY PO LIQD: 237.0000 mL | ORAL | Status: DC | PRN

## 2013-01-09 MED ORDER — HEPARIN SODIUM (PORCINE) 1000 UNIT/ML DIALYSIS: 1000.0000 [IU] | INTRAMUSCULAR | Status: DC | PRN

## 2013-01-09 MED ORDER — LIDOCAINE HCL (CARDIAC) 20 MG/ML IV SOLN
INTRAVENOUS | Status: DC | PRN
  Administered 2013-01-09: 100 mg via INTRAVENOUS

## 2013-01-09 MED ORDER — ALTEPLASE 2 MG IJ SOLR: 2.0000 mg | Freq: Once | INTRAMUSCULAR | Status: DC | PRN

## 2013-01-09 MED ORDER — OXYCODONE HCL 5 MG/5ML PO SOLN: 5.0000 mg | Freq: Once | ORAL | Status: AC | PRN

## 2013-01-09 MED ORDER — SODIUM CHLORIDE 0.9 % IV SOLN
INTRAVENOUS | Status: DC | PRN
  Administered 2013-01-09: 12:00:00 via INTRAVENOUS

## 2013-01-09 MED ORDER — OXYCODONE HCL 5 MG PO TABS
5.0000 mg | ORAL_TABLET | Freq: Once | ORAL | Status: AC | PRN
  Administered 2013-01-09: 5 mg via ORAL
  Filled 2013-01-09 (×2): qty 1

## 2013-01-09 SURGICAL SUPPLY — 30 items
CANISTER SUCTION 2500CC (MISCELLANEOUS) ×2 IMPLANT
CATH BALLN 4FR (CATHETERS) ×1 IMPLANT
CATH EMB 4FR 80CM (CATHETERS) ×1 IMPLANT
CLOTH BEACON ORANGE TIMEOUT ST (SAFETY) ×2 IMPLANT
CONT SPEC 4OZ CLIKSEAL STRL BL (MISCELLANEOUS) ×2 IMPLANT
COVER TABLE BACK 60X90 (DRAPES) ×2 IMPLANT
FILTER STRAW FLUID ASPIR (MISCELLANEOUS) IMPLANT
FORCEPS BIOP RJ4 1.8 (CUTTING FORCEPS) IMPLANT
GLOVE EUDERMIC 7 POWDERFREE (GLOVE) ×2 IMPLANT
GLOVE SURG SS PI 7.0 STRL IVOR (GLOVE) ×1 IMPLANT
GOWN PREVENTION PLUS XLARGE (GOWN DISPOSABLE) ×2 IMPLANT
GOWN STRL REIN XL XLG (GOWN DISPOSABLE) ×1 IMPLANT
KIT ROOM TURNOVER OR (KITS) ×2 IMPLANT
MARKER SKIN DUAL TIP RULER LAB (MISCELLANEOUS) ×2 IMPLANT
NS IRRIG 1000ML POUR BTL (IV SOLUTION) ×2 IMPLANT
OIL SILICONE PENTAX (PARTS (SERVICE/REPAIRS)) ×1 IMPLANT
PAD ARMBOARD 7.5X6 YLW CONV (MISCELLANEOUS) ×4 IMPLANT
SPONGE GAUZE 4X4 12PLY (GAUZE/BANDAGES/DRESSINGS) ×2 IMPLANT
STOPCOCK 4 WAY LG BORE MALE ST (IV SETS) ×1 IMPLANT
STOPCOCK MORSE 400PSI 3WAY (MISCELLANEOUS) ×2 IMPLANT
SUT SILK  1 MH (SUTURE) ×2
SUT SILK 1 MH (SUTURE) IMPLANT
SYR 20ML ECCENTRIC (SYRINGE) ×3 IMPLANT
SYR 3ML LL SCALE MARK (SYRINGE) ×1 IMPLANT
SYRINGE 10CC LL (SYRINGE) ×2 IMPLANT
SYSTEM IBV VALVE 7MM (Valve) ×2 IMPLANT
TAPE CLOTH SURG 6X10 WHT LF (GAUZE/BANDAGES/DRESSINGS) ×1 IMPLANT
TOWEL NATURAL 4PK STERILE (DISPOSABLE) ×1 IMPLANT
TRAP SPECIMEN MUCOUS 40CC (MISCELLANEOUS) ×2 IMPLANT
TUBE CONNECTING 12X1/4 (SUCTIONS) ×3 IMPLANT

## 2013-01-09 NOTE — Progress Notes (Signed)
INTERNAL MEDICINE TEACHING SERVICE Attending Note  Date: 01/09/2013  Patient name: [name] [name]  Medical record number: [id number]  Date of birth: November 12, 1961   This patient has been seen and discussed with the house staff. Please see their note for complete details. I concur with their findings with the following additions/corrections: Persistent air leak being addressed by CTS. Endobronchial valve to relieve persistent air leak.  He is being followed by ID due to bacteremia, with further investigation needed. TEE will be needed. As far as R IJ DVT, appears acute per doppler, he will need anticoagulation long term when ok with CTS. Due to history of GIB and anemia, he will need very close monitoring of CBC and clinically for bleed if on heparin. He will be transferred to [place] service with consultants continuing to follow. IMTS will sign off. Call back our service if needed with questions.  Dominic Pea, DO  01/09/2013, 2:53 PM

## 2013-01-09 NOTE — Progress Notes (Signed)
Hemodialysis-See Flowsheet Tolerated treatment well. Received 1 unit PRBCs on HD. Transported back to 3300 in stable condition.

## 2013-01-09 NOTE — Progress Notes (Signed)
Pt back from OR, VSS, mini express changed to sahara drain, 20cm suction. Wanted pain med, hungry, denied nausea. Up to chair this evening, tolerating diet, progressing well. Will continue to monitor.

## 2013-01-09 NOTE — Anesthesia Postprocedure Evaluation (Signed)
Anesthesia Post Note  Patient: [name] [name]  Procedure(s) Performed: Procedure(s) (LRB): VIDEO BRONCHOSCOPY WITH INSERTION OF INTERBRONCHIAL VALVE (IBV) (N/A)  Anesthesia type: general  Patient location: PACU  Post pain: Pain level controlled  Post assessment: Patient's Cardiovascular Status Stable  Last Vitals:  Filed Vitals:   01/09/13 1445  BP: 127/76  Pulse: 112  Temp:   Resp: 27    Post vital signs: Reviewed and stable  Level of consciousness: sedated  Complications: No apparent anesthesia complications

## 2013-01-09 NOTE — Progress Notes (Signed)
Pt returned from HD, RN reported one unit PRBCs given post [place] arrived immediately after to collect PT for OR procedure. VSS, Pt reporting pain. To be addressed in OR holding.

## 2013-01-09 NOTE — Progress Notes (Signed)
Dr. Blase Mess at bedside spoke with pt informed of temperature orders  recieved

## 2013-01-09 NOTE — Preoperative (Signed)
Beta Blockers   Reason not to administer Beta Blockers:B blocker taken 01/08/13 @ abt 6pm per pt

## 2013-01-09 NOTE — H&P (View-Only) (Signed)
  Subjective: Events noted.  Patient readmitted with fever. Apparently is growing staph aureus from blood cultures done at [facility].  He is having a lot of pain at the chest tube sites, but there is no obvious infection there.  He still has a large air leak    Objective: Vital signs in last 24 hours: Temp:  [97.5 F (36.4 C)-100.5 F (38.1 C)] 98.4 F (36.9 C) (01/30 1146) Pulse Rate:  [90-133] 90  (01/30 0400) Cardiac Rhythm:  [-] Normal sinus rhythm (01/29 2336) Resp:  [14-30] 23  (01/30 0400) BP: (100-139)/(59-101) 114/59 mmHg (01/30 1146) SpO2:  [93 %-98 %] 94 % (01/30 1146) Weight:  [212 lb 11.9 oz (96.5 kg)] 212 lb 11.9 oz (96.5 kg) (01/30 0400)  Hemodynamic parameters for last 24 hours:    Intake/Output from previous day: 01/29 0701 - 01/30 0700 In: 720 [P.O.:720] Out: 3216 [Stool:1; Chest Tube:190] Intake/Output this shift:    General appearance: alert and mild distress Heart: tachy, regular Lungs: audible air leak on right  Lab Results:  Basename 01/08/13 0900 01/06/13 1407  WBC 10.3 14.0*  HGB 7.7* 8.8*  HCT 23.5* 28.1*  PLT 198 180   BMET:  Basename 01/06/13 2330 01/06/13 1407  NA 137 135  K 5.1 5.9*  CL 94* 93*  CO2 27 26  GLUCOSE 110* 104*  BUN 75* 63*  CREATININE 14.82* 14.01*  CALCIUM 10.0 10.2    PT/INR: No results found for this basename: LABPROT,INR in the last 72 hours ABG    Component Value Date/Time   PHART 7.566* 12/19/2012 0535   HCO3 26.9* 12/19/2012 0535   TCO2 28 12/19/2012 0535   O2SAT 100.0 12/19/2012 0535   CBG (last 3)   Basename 01/06/13 2010  GLUCAP 100*    Assessment/Plan: S/P   - Prolonged air leak with both basilar and apical spaces. There are tube well positioned in each of the spaces, but the air leak is preventing the spaces from contracting. I think if we have to get the air leak to resolve to eliminate the space issues.  Options are redo thoracotomy and endobronchial valves. I don't know if the  endobronchial valves will be effective in his case, but I do think it is worth trying prior to committing him to a redo thoracotomy. I have d/w the patient and his wife the indications, risks, benefits and alternatives. They understand the general nature of the procedure, the possibility that it won't resolve the air leak and the need for another bronchoscopy in about 4 weeks to remove the valves. They understand this is a new procedure and the devices are not fully FDA approved. They understand the risks associated with bronchoscopy and valve placement as well as the risks of general anesthesia. He wishes to proceed.   LOS: 2 days    Herma Uballe C 01/08/2013

## 2013-01-09 NOTE — Transfer of Care (Signed)
Immediate Anesthesia Transfer of Care Note  Patient: [name] [name]  Procedure(s) Performed: Procedure(s) (LRB) with comments: VIDEO BRONCHOSCOPY WITH INSERTION OF INTERBRONCHIAL VALVE (IBV) (N/A) - placement of 2 Intrabronchial valves in right lower lobe  Patient Location: PACU  Anesthesia Type:General  Level of Consciousness: awake, alert  and oriented  Airway & Oxygen Therapy: Patient Spontanous Breathing and Patient connected to face mask oxygen  Post-op Assessment: Report given to PACU RN, Post -op Vital signs reviewed and stable and Patient moving all extremities  Post vital signs: Reviewed and stable  Complications: No apparent anesthesia complications

## 2013-01-09 NOTE — Procedures (Signed)
Patient was seen on dialysis and the procedure was supervised.  BFR 400  Via AVG BP is  125/74.   Patient appears to be tolerating treatment well  Antwoin Lackey A 01/09/2013

## 2013-01-09 NOTE — Anesthesia Preprocedure Evaluation (Signed)
Anesthesia Evaluation  Patient identified by MRN, date of birth, ID band Patient awake    Reviewed: Allergy & Precautions, H&P , NPO status , Patient's Chart, lab work & pertinent test results  Airway Mallampati: II TM Distance: >3 FB Neck ROM: Full    Dental   Pulmonary          Cardiovascular hypertension, Pt. on medications     Neuro/Psych    GI/Hepatic   Endo/Other    Renal/GU ESRF and DialysisRenal disease     Musculoskeletal   Abdominal   Peds  Hematology   Anesthesia Other Findings   Reproductive/Obstetrics                           Anesthesia Physical Anesthesia Plan  ASA: III  Anesthesia Plan: General   Post-op Pain Management:    Induction: Intravenous  Airway Management Planned: Oral ETT  Additional Equipment:   Intra-op Plan:   Post-operative Plan: Extubation in OR  Informed Consent: I have reviewed the patients History and Physical, chart, labs and discussed the procedure including the risks, benefits and alternatives for the proposed anesthesia with the patient or authorized representative who has indicated his/her understanding and acceptance.     Plan Discussed with: CRNA and Surgeon  Anesthesia Plan Comments:         Anesthesia Quick Evaluation

## 2013-01-09 NOTE — Progress Notes (Signed)
INFECTIOUS DISEASE PROGRESS NOTE  ID: [name] [name] is a 52 y.o. male with  Principal Problem:  *Bacteremia Active Problems:  End-stage renal disease on hemodialysis  Anemia of chronic disease  Subjective: Without complaints  Abtx:  Anti-infectives     Start     Dose/Rate Route Frequency Ordered Stop   01/07/13 2000   ceFAZolin (ANCEF) IVPB 2 g/50 mL premix        2 g 100 mL/hr over 30 Minutes Intravenous Every M-W-F (2000) 01/07/13 1837     01/07/13 1500   ceFEPIme (MAXIPIME) 500 mg in dextrose 5 % 50 mL IVPB  Status:  Discontinued        500 mg 100 mL/hr over 30 Minutes Intravenous Every 24 hours 01/06/13 1909 01/07/13 0815   01/07/13 1200   vancomycin (VANCOCIN) IVPB 1000 mg/200 mL premix  Status:  Discontinued        1,000 mg 200 mL/hr over 60 Minutes Intravenous Every M-W-F (Hemodialysis) 01/06/13 1909 01/09/13 1250   01/07/13 0900   piperacillin-tazobactam (ZOSYN) IVPB 2.25 g  Status:  Discontinued        2.25 g 100 mL/hr over 30 Minutes Intravenous 3 times per day 01/07/13 0827 01/07/13 1754   01/06/13 1915   vancomycin (VANCOCIN) IVPB 1000 mg/200 mL premix        1,000 mg 200 mL/hr over 60 Minutes Intravenous  Once 01/06/13 1909 01/06/13 2019   01/06/13 1330   ceFEPIme (MAXIPIME) 2 g in dextrose 5 % 50 mL IVPB        2 g 100 mL/hr over 30 Minutes Intravenous  Once 01/06/13 1320 01/06/13 1520   01/06/13 1300   vancomycin (VANCOCIN) IVPB 1000 mg/200 mL premix        1,000 mg 200 mL/hr over 60 Minutes Intravenous  Once 01/06/13 1258 01/06/13 1426          Medications:  Scheduled:   . calcium acetate  2,001 mg Oral With snacks  . calcium acetate  2,668 mg Oral TID WC  .  ceFAZolin (ANCEF) IV  2 g Intravenous Q M,W,F-2000  . cinacalcet  180 mg Oral Q2000  . darbepoetin (ARANESP) injection - DIALYSIS  150 mcg Intravenous Q Fri-HD  . doxercalciferol  2 mcg Intravenous Q M,W,F-HD  . heparin  5,000 Units Subcutaneous Q8H  . HYDROmorphone      .  multivitamin  1 tablet Oral QHS  . senna-docusate  1 tablet Oral BID    Objective: Vital signs in last 24 hours: Temp:  [97.9 F (36.6 C)-101.5 F (38.6 C)] 99.5 F (37.5 C) (01/31 1515) Pulse Rate:  [89-120] 112  (01/31 1515) Resp:  [10-27] 17  (01/31 1515) BP: (108-159)/(57-97) 143/79 mmHg (01/31 1515) SpO2:  [92 %-100 %] 98 % (01/31 1515) Weight:  [212 lb 11.9 oz (96.5 kg)-217 lb 2.5 oz (98.5 kg)] 212 lb 11.9 oz (96.5 kg) (01/31 1009)   General appearance: alert, cooperative and no distress Resp: diminished breath sounds bibasilar and rhonchi bibasilar Cardio: regular rate and rhythm GI: normal findings: bowel sounds normal and soft, non-tender  Lab Results  Basename 01/09/13 0652 01/08/13 0900 01/06/13 2330  WBC 11.3* 10.3 --  HGB 7.6* 7.7* --  HCT 23.7* 23.5* --  NA 137 -- 137  K 4.6 -- 5.1  CL 95* -- 94*  CO2 28 -- 27  BUN 65* -- 75*  CREATININE 12.98* -- 14.82*  GLU -- -- --   Liver Panel  Basename 01/09/13 [id number]  PROT --  ALBUMIN 2.2*  AST --  ALT --  ALKPHOS --  BILITOT --  BILIDIR --  IBILI --   Sedimentation Rate No results found for this basename: ESRSEDRATE in the last 72 hours C-Reactive Protein No results found for this basename: CRP:2 in the last 72 hours  Microbiology: Recent Results (from the past 240 hour(s))  CULTURE, BLOOD (ROUTINE X 2)     Status: Normal (Preliminary result)   Collection Time   01/06/13 11:34 AM      Component Value Range Status Comment   Specimen Description BLOOD ARM LEFT   Final    Special Requests BOTTLES DRAWN AEROBIC AND ANAEROBIC 10CC   Final    Culture  Setup Time 01/06/2013 18:01   Final    Culture     Final    Value:        BLOOD CULTURE RECEIVED NO GROWTH TO DATE CULTURE WILL BE HELD FOR 5 DAYS BEFORE ISSUING A FINAL NEGATIVE REPORT   Report Status PENDING   Incomplete   CULTURE, BLOOD (ROUTINE X 2)     Status: Normal (Preliminary result)   Collection Time   01/06/13 11:45 AM      Component Value Range  Status Comment   Specimen Description BLOOD ARM LEFT   Final    Special Requests BOTTLES DRAWN AEROBIC AND ANAEROBIC 10CC   Final    Culture  Setup Time 01/06/2013 18:01   Final    Culture     Final    Value:        BLOOD CULTURE RECEIVED NO GROWTH TO DATE CULTURE WILL BE HELD FOR 5 DAYS BEFORE ISSUING A FINAL NEGATIVE REPORT   Report Status PENDING   Incomplete   MRSA PCR SCREENING     Status: Normal   Collection Time   01/07/13 12:18 AM      Component Value Range Status Comment   MRSA by PCR NEGATIVE  NEGATIVE Final   CULTURE, BLOOD (SINGLE)     Status: Normal (Preliminary result)   Collection Time   01/07/13  2:40 AM      Component Value Range Status Comment   Specimen Description BLOOD LEFT HAND   Final    Special Requests BOTTLES DRAWN AEROBIC ONLY [facility]   Final    Culture  Setup Time 01/07/2013 09:19   Final    Culture     Final    Value:        BLOOD CULTURE RECEIVED NO GROWTH TO DATE CULTURE WILL BE HELD FOR 5 DAYS BEFORE ISSUING A FINAL NEGATIVE REPORT   Report Status PENDING   Incomplete     Studies/Results: Ct Chest Wo Contrast  01/08/2013  *RADIOLOGY REPORT*  Clinical Data: Right-sided chest pain.  Possible empyema.  Fever and chills.  CT CHEST WITHOUT CONTRAST  Technique:  Multidetector CT imaging of the chest was performed following the standard protocol without IV contrast.  Comparison: Chest CT 12/12/2012.  Findings:  Mediastinum: Heart size is borderline enlarged. A small amount of pericardial fluid and/or thickening, unlikely to be of hemodynamic significance.  No associated pericardial calcification. There is atherosclerosis of the thoracic aorta, the great vessels of the mediastinum and the coronary arteries, including calcified atherosclerotic plaque in the left main, left anterior descending and right coronary arteries. Numerous borderline enlarged and mildly enlarged mediastinal lymph nodes, largest of which is in the superior mediastinum immediately lateral to the origin  of the innominate artery measuring 1.4 cm in short  axis, slightly increased compared to prior examinations.  Esophagus is unremarkable in appearance. 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  There is also some calcification more inferiorly within the superior vena cava at the level of the superior cavoatrial junction.  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  Lungs/Pleura: Compared to the prior examination there has been interval placement of two right-sided chest tubes, one with the tip in the posterior right costophrenic sulcus and the other with the tip directed superiorly into the right apex.  The previously noted right-sided pleural effusion is nearly completely drained, although there is some persistent gas and fluid layering dependently in the right hemithorax.  The right lung has failed to adequately expand, suggesting pneumothorax ex vacuo.  Again noted are multiple pleural based mass like opacities and architectural distortion in the right lung, favored to represent areas of rounded atelectasis.  Profound thickening of the pleura throughout the right hemithorax is again noted.  Trace left pleural effusion layering dependently.  Left lung is clear.  Left lung is clear and well aerated.  Upper Abdomen: Previously mentioned chest wall collaterals extend into the upper abdomen and are very large.  Musculoskeletal: There are no aggressive appearing lytic or blastic lesions noted in the visualized portions of the skeleton.  IMPRESSION:  1.  Interval placement of two right-sided chest tubes with near complete drainage of the right pleural effusion compared to the prior study.  A small amount of residual dependent gas and fluid is  noted in the right hemithorax. 2.  The right lung has failed to adequately re-expand, most compatible with pneumothorax ex vacuo. 3.  Extensive right-sided pleural thickening is similar to the prior examination, and there is extensive architectural distortion and pleural based mass like opacities in the right lung which are favored to represent a large areas of rounded atelectasis. 4.  Calcification in this extending from the right internal jugular vein into the superior vena cava favored to represent chronically calcified fibrin sheath and/or chronically calcified clot. Associated with this there are numerous venous collaterals throughout the chest wall and upper abdomen bilaterally.  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  Clinical correlation for signs and symptoms of the SVC syndrome is recommended. 5.  Trace left pleural effusion layering dependently is simple in appearance.   Original Report Authenticated By: Vinnie Langton, M.D.    Dg Chest Port 1 View  01/09/2013  *RADIOLOGY REPORT*  Clinical Data: Status post chest tube  PORTABLE CHEST - 1 VIEW  Comparison: CT chest dated 01/08/2013  Findings: Small apical hydropneumothorax with layering fluid at the right lung base.  This appearance is improved from prior CT.  Stable right apical and right basilar chest tubes.  Left lung essentially clear.  Cardiomegaly.  IMPRESSION: Small apical hydropneumothorax, improved.  Stable right apical and basilar chest tubes.   Original Report Authenticated By: Julian Hy, M.D.      Assessment/Plan: S aureus bacteremia (MSSA)    Repeat BCx 1-28 NGTD Chest tube, previous pleural peel- now small amt of gas/fluid on R  SVC occlusion?  S/p insertion of interbronchial valve for air leak ESRD Needs TEE Will stop vanco Total days of antibiotics: 4 vanco/ancef         [name] [name]  [name] [name] Infectious Diseases [id number] 01/09/2013, 4:32 PM   LOS: 3 days

## 2013-01-09 NOTE — Progress Notes (Signed)
Subjective:    Patient seen in HD this AM. Complains of pain around his chest tube site(s). Denies any other complaints this AM. Denies any SOB.  Interval Events: No acute events. Afebrile over previous 24 hours.    Objective:    Vital Signs:   Temp:  [97.9 F (36.6 C)-99 F (37.2 C)] 97.9 F (36.6 C) (01/31 1009) Pulse Rate:  [89-100] 89  (01/31 1009) Resp:  [11-20] 16  (01/31 1009) BP: (108-159)/(57-85) 153/84 mmHg (01/31 1009) SpO2:  [92 %-95 %] 92 % (01/31 0630) Weight:  [213 lb 6.5 oz (96.8 kg)-217 lb 2.5 oz (98.5 kg)] 217 lb 2.5 oz (98.5 kg) (01/31 0630) Last BM Date: 01/06/13  24-hour weight change: Weight change: 10.6 oz (0.3 kg)  Intake/Output:   Intake/Output Summary (Last 24 hours) at 01/09/13 1012 Last data filed at 01/09/13 0400  Gross per 24 hour  Intake    240 ml  Output    110 ml  Net    130 ml      Physical Exam: General: Vital signs reviewed and noted. Well-developed, well-nourished, in no acute distress; alert, appropriate and cooperative throughout examination. Receiving HD through RUE access.   Lungs:  Normal respiratory effort. Decreased breath sounds bilaterally (R>L).   Heart: RRR. S1 and S2 normal without gallop, murmur, or rubs.  Abdomen:  BS normoactive. Soft, Nondistended, non-tender.  Extremities: No pretibial edema.     Labs:  Basic Metabolic Panel:  Lab 0000000 [id number] 01/06/13 2330 01/06/13 1407 01/06/13 1134  NA 137 137 135 137  K 4.6 5.1 5.9* 5.9*  CL 95* 94* 93* 95*  CO2 28 27 26 27   GLUCOSE 89 110* 104* 90  BUN 65* 75* 63* 61*  CREATININE 12.98* 14.82* 14.01* 14.14*  CALCIUM 9.3 10.0 10.2 --  MG -- -- -- --  PHOS 5.1* -- -- --    Liver Function Tests:  Lab 01/09/13 0652 01/06/13 1407 01/06/13 1134  AST -- 13 12  ALT -- 11 11  ALKPHOS -- 70 69  BILITOT -- 0.5 0.5  PROT -- 7.2 7.1  ALBUMIN 2.2* 2.2* 2.3*   CBC:  Lab 01/09/13 0652 01/08/13 0900 01/06/13 1407 01/06/13 1134  WBC 11.3* 10.3 14.0* 13.6*    NEUTROABS -- -- 10.7* 10.3*  HGB 7.6* 7.7* 8.8* 8.7*  HCT 23.7* 23.5* 28.1* 27.2*  MCV 90.1 88.7 92.4 91.9  PLT 232 198 180 176   CBG:  Lab 01/06/13 2010  GLUCAP 100*    Microbiology: Results for orders placed during the hospital encounter of 01/06/13  CULTURE, BLOOD (ROUTINE X 2)     Status: Normal (Preliminary result)   Collection Time   01/06/13 11:34 AM      Component Value Range Status Comment   Specimen Description BLOOD ARM LEFT   Final    Special Requests BOTTLES DRAWN AEROBIC AND ANAEROBIC 10CC   Final    Culture  Setup Time 01/06/2013 18:01   Final    Culture     Final    Value:        BLOOD CULTURE RECEIVED NO GROWTH TO DATE CULTURE WILL BE HELD FOR 5 DAYS BEFORE ISSUING A FINAL NEGATIVE REPORT   Report Status PENDING   Incomplete   CULTURE, BLOOD (ROUTINE X 2)     Status: Normal (Preliminary result)   Collection Time   01/06/13 11:45 AM      Component Value Range Status Comment   Specimen Description BLOOD ARM LEFT  Final    Special Requests BOTTLES DRAWN AEROBIC AND ANAEROBIC 10CC   Final    Culture  Setup Time 01/06/2013 18:01   Final    Culture     Final    Value:        BLOOD CULTURE RECEIVED NO GROWTH TO DATE CULTURE WILL BE HELD FOR 5 DAYS BEFORE ISSUING A FINAL NEGATIVE REPORT   Report Status PENDING   Incomplete   MRSA PCR SCREENING     Status: Normal   Collection Time   01/07/13 12:18 AM      Component Value Range Status Comment   MRSA by PCR NEGATIVE  NEGATIVE Final   CULTURE, BLOOD (SINGLE)     Status: Normal (Preliminary result)   Collection Time   01/07/13  2:40 AM      Component Value Range Status Comment   Specimen Description BLOOD LEFT HAND   Final    Special Requests BOTTLES DRAWN AEROBIC ONLY [facility]   Final    Culture  Setup Time 01/07/2013 09:19   Final    Culture     Final    Value:        BLOOD CULTURE RECEIVED NO GROWTH TO DATE CULTURE WILL BE HELD FOR 5 DAYS BEFORE ISSUING A FINAL NEGATIVE REPORT   Report Status PENDING   Incomplete     Imaging: Ct Chest Wo Contrast  01/08/2013  *RADIOLOGY REPORT*  Clinical Data: Right-sided chest pain.  Possible empyema.  Fever and chills.  CT CHEST WITHOUT CONTRAST  Technique:  Multidetector CT imaging of the chest was performed following the standard protocol without IV contrast.  Comparison: Chest CT 12/12/2012.  Findings:  Mediastinum: Heart size is borderline enlarged. A small amount of pericardial fluid and/or thickening, unlikely to be of hemodynamic significance.  No associated pericardial calcification. There is atherosclerosis of the thoracic aorta, the great vessels of the mediastinum and the coronary arteries, including calcified atherosclerotic plaque in the left main, left anterior descending and right coronary arteries. Numerous borderline enlarged and mildly enlarged mediastinal lymph nodes, largest of which is in the superior mediastinum immediately lateral to the origin of the innominate artery measuring 1.4 cm in short axis, slightly increased compared to prior examinations.  Esophagus is unremarkable in appearance. 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  There is also some calcification more inferiorly within the superior vena cava at the level of the superior cavoatrial junction.  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  Lungs/Pleura: Compared to the prior examination there has been interval placement of two right-sided chest tubes, one with the tip in the posterior right costophrenic sulcus and the other with the tip directed superiorly into the right apex.  The previously noted right-sided pleural effusion is nearly completely drained, although there is some persistent gas and fluid layering dependently  in the right hemithorax.  The right lung has failed to adequately expand, suggesting pneumothorax ex vacuo.  Again noted are multiple pleural based mass like opacities and architectural distortion in the right lung, favored to represent areas of rounded atelectasis.  Profound thickening of the pleura throughout the right hemithorax is again noted.  Trace left pleural effusion layering dependently.  Left lung is clear.  Left lung is clear and well aerated.  Upper Abdomen: Previously  mentioned chest wall collaterals extend into the upper abdomen and are very large.  Musculoskeletal: There are no aggressive appearing lytic or blastic lesions noted in the visualized portions of the skeleton.  IMPRESSION:  1.  Interval placement of two right-sided chest tubes with near complete drainage of the right pleural effusion compared to the prior study.  A small amount of residual dependent gas and fluid is noted in the right hemithorax. 2.  The right lung has failed to adequately re-expand, most compatible with pneumothorax ex vacuo. 3.  Extensive right-sided pleural thickening is similar to the prior examination, and there is extensive architectural distortion and pleural based mass like opacities in the right lung which are favored to represent a large areas of rounded atelectasis. 4.  Calcification in this extending from the right internal jugular vein into the superior vena cava favored to represent chronically calcified fibrin sheath and/or chronically calcified clot. Associated with this there are numerous venous collaterals throughout the chest wall and upper abdomen bilaterally.  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  Clinical correlation for signs and symptoms of the SVC syndrome is recommended. 5.  Trace left pleural effusion layering dependently is simple in appearance.   Original Report Authenticated By: Vinnie Langton, M.D.        Medications:     Infusions:    Scheduled Medications:    . calcium acetate  2,001 mg Oral With snacks  . calcium acetate  2,668 mg Oral TID WC  .  ceFAZolin (ANCEF) IV  2 g Intravenous Q M,W,F-2000  . cinacalcet  180 mg Oral Q2000  . darbepoetin (ARANESP) injection - DIALYSIS  150 mcg Intravenous Q Fri-HD  . doxercalciferol  2 mcg Intravenous Q M,W,F-HD  . heparin  5,000 Units Subcutaneous Q8H  . multivitamin  1 tablet Oral QHS  . senna-docusate  1 tablet Oral BID  . vancomycin  1,000 mg Intravenous Q M,W,F-HD    PRN Medications: sodium chloride, sodium chloride, acetaminophen, alteplase, feeding supplement (NEPRO CARB STEADY), heparin, heparin, lidocaine, lidocaine-prilocaine, ondansetron, ondansetron, oxyCODONE-acetaminophen, pentafluoroprop-tetrafluoroeth   Assessment/ Plan:   Pt is a 52 y.o. yo male with a PMHx of ESRD on HD, R-sided pleural effusion, who was admitted on 01/06/2013 with sepsis.   Chest tubes with persistent air leak - CT chest revealed R apical and basilar pneumothorax. Dr. Roxan Hockey is planning endobronchial valve procedure today to remedy the persistent air lack.   Bacteremia - Sepsis resolved. Afebrile overnight. WBC = 11.3 this AM.  Pt has 2/2 cultures positive for MSSA per most recent report from [facility] this AM. TTE was a poor-quality study, but did not reveal any vegetations. CT did not reveal any findings concerning for empyema.  - cont vanc, ancef => if ID agrees, will d/c vanc - consider TEE   ESRD on HD - On MWF schedule. K = 4.6 this AM. No signs/symptoms of hypervolemia. - HD this AM per nephrology  R IJ DVT - patient has acute DVT of the R IJ, per doppler u/s of the RUE. No associated signs/symptoms concerning for PE at this time.  - heparin per pharmacy  Recent GI Bleed - no active bleeding. Hb trending down slightly since admission (8.8 => 7.7 => 7.6 over previous 48 hours). Will monitor closely for signs of bleeding after starting therapeutic  heparin for R IJ DVT, per above - cont daily CBCs   Hypertension - stable. Holding home anti-HTN (metoprolol) while acutely  ill.  DVT PPX - heparin  CODE STATUS - full  CONSULTS PLACED - nephrology, CVTS, ID  DISPO - Disposition is deferred at this time, awaiting improvement of fever/bacteremia.  Anticipated discharge in approximately 2-3 day(s).  The patient does have a current PCP (No primary provider on file.) and will not be requiring OPC follow-up after discharge.  The patient does not have transportation limitations that hinder transportation to clinic appointments.  Services Needed at time of discharge: TO [place] Y = Yes, Blank = No  PT:    OT:    RN:    Equipment:    Other:         Length of Stay: 3 day(s)   Signed: Gae Gallop, MD  PGY-1, Internal Medicine Resident Pager: [phone] (7AM-5PM) 01/09/2013, 10:12 AM

## 2013-01-09 NOTE — Brief Op Note (Signed)
01/06/2013 - 01/09/2013  1:09 PM  PATIENT:  [name] [name]  53 y.o. male  PRE-OPERATIVE DIAGNOSIS:  AIR LEAK in basilar and apical spaces  POST-OPERATIVE DIAGNOSIS:  AIR LEAK in basilar and apical spaces  PROCEDURE:  Procedure(s) (LRB) with comments: VIDEO BRONCHOSCOPY WITH INSERTION OF INTERBRONCHIAL VALVE (IBV) (N/A) - placement of 2 Intrabronchial valves in right lower lobe  SURGEON:  Surgeon(s) and Role:    * Melrose Nakayama, MD - Primary  ANESTHESIA:   general  EBL:  Total I/O In: 400 [I.V.:400] Out: [id number]; Blood:75]  BLOOD ADMINISTERED:none   SPECIMEN:  Source of Specimen:  BAL- sent to micro   PLAN OF CARE: keep as inpatient  PATIENT DISPOSITION:  PACU - hemodynamically stable.   Delay start of Pharmacological VTE agent (>24hrs) due to surgical blood loss or risk of bleeding: not applicable

## 2013-01-09 NOTE — Interval H&P Note (Signed)
History and Physical Interval Note:  01/09/2013 10:58 AM  [name] [name]  has presented today for surgery, with the diagnosis of AIR LEAK in basilar and apical spaces  The various methods of treatment have been discussed with the patient and family. After consideration of risks, benefits and other options for treatment, the patient has consented to  Procedure(s) (LRB) with comments: VIDEO BRONCHOSCOPY WITH INSERTION OF INTERBRONCHIAL VALVE (IBV) (N/A) as a surgical intervention .  The patient's history has been reviewed, patient examined, no change in status, stable for surgery.  I have reviewed the patient's chart and labs.  Questions were answered to the patient's satisfaction.     Chole Driver C

## 2013-01-09 NOTE — Progress Notes (Signed)
Dr. Conrad  called regarding temp asked for nurse to call Dr. Blase Mess

## 2013-01-09 NOTE — Progress Notes (Signed)
ANTICOAGULATION CONSULT NOTE - Initial Consult  Pharmacy Consult for heparin Indication: DVT  No Known Allergies  Patient Measurements: Height: 5\' 4"  (162.6 cm) Weight: 212 lb 11.9 oz (96.5 kg) IBW/kg (Calculated) : 59.2    Vital Signs: Temp: 100.6 F (38.1 C) (01/31 1415) Temp src: Oral (01/31 1009) BP: 128/82 mmHg (01/31 1400) Pulse Rate: 116  (01/31 1400)  Labs:  Basename 01/09/13 0652 01/08/13 0900 01/06/13 2330  HGB 7.6* 7.7* --  HCT 23.7* 23.5* --  PLT 232 198 --  APTT -- -- --  LABPROT -- -- --  INR -- -- --  HEPARINUNFRC -- -- --  CREATININE 12.98* -- 14.82*  CKTOTAL -- -- --  CKMB -- -- --  TROPONINI -- -- --    Estimated Creatinine Clearance: 7.2 ml/min (by C-G formula based on Cr of 12.98).   Medical History: Past Medical History  Diagnosis Date  . End-stage renal disease on hemodialysis   . Secondary hyperparathyroidism   . Hypertension   . Polysubstance abuse   . Anemia of chronic disease     BL Hgb ~10  . Tobacco abuse     Medications:  Scheduled:    . [facility] HOLD] calcium acetate  2,001 mg Oral With snacks  . [facility] HOLD] calcium acetate  2,668 mg Oral TID WC  . [MAR HOLD]  ceFAZolin (ANCEF) IV  2 g Intravenous Q M,W,F-2000  . [facility] HOLD] cinacalcet  180 mg Oral Q2000  . [MAR HOLD] darbepoetin (ARANESP) injection - DIALYSIS  150 mcg Intravenous Q Fri-HD  . [facility] HOLD] doxercalciferol  2 mcg Intravenous Q M,W,F-HD  . [MAR HOLD] heparin  5,000 Units Subcutaneous Q8H  . [MAR HOLD] multivitamin  1 tablet Oral QHS  . [EXPIRED] ondansetron      . [MAR HOLD] senna-docusate  1 tablet Oral BID  . [DISCONTINUED] vancomycin  1,000 mg Intravenous Q M,W,F-HD    Assessment: Pt with MMP who was found to have a DVT in the IJ. Heparin was ordered this morning but pt had scheduled insertion of IBV this PM. D/w the case with Dr. Para Skeans, we will hold off heparin until it's safe to start anticoagulant.   Goal of Therapy:  Heparin level 0.3-0.7  units/ml Monitor platelets by anticoagulation protocol: Yes   Plan:  Hold heparin until ok per MD  Wilfred Lacy 01/09/2013,2:18 PM

## 2013-01-09 NOTE — Progress Notes (Addendum)
Subjective:  Seen on HD today, Plan is to plan endobronchial valve to assist with air leak.  Fever curve continues to improve.  Echo- veg can not be ruled out due to poor imaging  Objective Vital signs in last 24 hours: Filed Vitals:   01/09/13 0630 01/09/13 0700 01/09/13 0730 01/09/13 0800  BP: 125/63 129/69 128/70 125/59  Pulse:  99 100 99  Temp: 98.5 F (36.9 C)     TempSrc: Oral     Resp:      Height:      Weight: 98.5 kg (217 lb 2.5 oz)     SpO2: 92%      Weight change: 0.3 kg (10.6 oz)  Intake/Output Summary (Last 24 hours) at 01/09/13 0827 Last data filed at 01/09/13 0400  Gross per 24 hour  Intake    240 ml  Output    110 ml  Net    130 ml   Labs: Basic Metabolic Panel:  Lab 0000000 0652 01/06/13 2330 01/06/13 1407  NA 137 137 135  K 4.6 5.1 5.9*  CL 95* 94* 93*  CO2 28 27 26   GLUCOSE 89 110* 104*  BUN 65* 75* 63*  CREATININE 12.98* 14.82* 14.01*  CALCIUM 9.3 10.0 10.2  ALB -- -- --  PHOS 5.1* -- --   Liver Function Tests:  Lab 01/09/13 0652 01/06/13 1407 01/06/13 1134  AST -- 13 12  ALT -- 11 11  ALKPHOS -- 70 69  BILITOT -- 0.5 0.5  PROT -- 7.2 7.1  ALBUMIN 2.2* 2.2* 2.3*   No results found for this basename: LIPASE:3,AMYLASE:3 in the last 168 hours No results found for this basename: AMMONIA:3 in the last 168 hours CBC:  Lab 01/09/13 0652 01/08/13 0900 01/06/13 1407 01/06/13 1134  WBC 11.3* 10.3 14.0* --  NEUTROABS -- -- 10.7* 10.3*  HGB 7.6* 7.7* 8.8* --  HCT 23.7* 23.5* 28.1* --  MCV 90.1 88.7 92.4 91.9  PLT 232 198 180 --   Cardiac Enzymes: No results found for this basename: CKTOTAL:5,CKMB:5,CKMBINDEX:5,TROPONINI:5 in the last 168 hours CBG:  Lab 01/06/13 2010  GLUCAP 100*    Iron Studies: No results found for this basename: IRON,TIBC,TRANSFERRIN,FERRITIN in the last 72 hours Studies/Results: Ct Chest Wo Contrast  01/08/2013  *RADIOLOGY REPORT*  Clinical Data: Right-sided chest pain.  Possible empyema.  Fever and chills.  CT  CHEST WITHOUT CONTRAST  Technique:  Multidetector CT imaging of the chest was performed following the standard protocol without IV contrast.  Comparison: Chest CT 12/12/2012.  Findings:  Mediastinum: Heart size is borderline enlarged. [name] small amount of pericardial fluid and/or thickening, unlikely to be of hemodynamic significance.  No associated pericardial calcification. There is atherosclerosis of the thoracic aorta, the great vessels of the mediastinum and the coronary arteries, including calcified atherosclerotic plaque in the left main, left anterior descending and right coronary arteries. Numerous borderline enlarged and mildly enlarged mediastinal lymph nodes, largest of which is in the superior mediastinum immediately lateral to the origin of the innominate artery measuring 1.4 cm in short axis, slightly increased compared to prior examinations.  Esophagus is unremarkable in appearance. [name] calcified structure extending from the distal right internal jugular vein into the proximal superior vena cava, favored to represent [name] chronically calcified fibrin sheath.  There is also some calcification more inferiorly within the superior vena cava at the level of the superior cavoatrial junction.  The associated with this there are innumerable venous collaterals throughout the chest wall bilaterally, suggesting hemodynamically  significant stenosis or occlusion of the superior vena cava.  Lungs/Pleura: Compared to the prior examination there has been interval placement of two right-sided chest tubes, one with the tip in the posterior right costophrenic sulcus and the other with the tip directed superiorly into the right apex.  The previously noted right-sided pleural effusion is nearly completely drained, although there is some persistent gas and fluid layering dependently in the right hemithorax.  The right lung has failed to adequately expand, suggesting pneumothorax ex vacuo.  Again noted are multiple pleural based  mass like opacities and architectural distortion in the right lung, favored to represent areas of rounded atelectasis.  Profound thickening of the pleura throughout the right hemithorax is again noted.  Trace left pleural effusion layering dependently.  Left lung is clear.  Left lung is clear and well aerated.  Upper Abdomen: Previously mentioned chest wall collaterals extend into the upper abdomen and are very large.  Musculoskeletal: There are no aggressive appearing lytic or blastic lesions noted in the visualized portions of the skeleton.  IMPRESSION:  1.  Interval placement of two right-sided chest tubes with near complete drainage of the right pleural effusion compared to the prior study.  [name] small amount of residual dependent gas and fluid is noted in the right hemithorax. 2.  The right lung has failed to adequately re-expand, most compatible with pneumothorax ex vacuo. 3.  Extensive right-sided pleural thickening is similar to the prior examination, and there is extensive architectural distortion and pleural based mass like opacities in the right lung which are favored to represent [name] large areas of rounded atelectasis. 4.  Calcification in this extending from the right internal jugular vein into the superior vena cava favored to represent chronically calcified fibrin sheath and/or chronically calcified clot. Associated with this there are numerous venous collaterals throughout the chest wall and upper abdomen bilaterally.  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  Clinical correlation for signs and symptoms of the SVC syndrome is recommended. 5.  Trace left pleural effusion layering dependently is simple in appearance.   Original Report Authenticated By: Vinnie Langton, M.D.    Medications: Infusions:    Scheduled Medications:    . calcium acetate  2,001 mg Oral With snacks  . calcium acetate  2,668 mg Oral TID WC  .  ceFAZolin (ANCEF) IV  2 g  Intravenous Q M,W,F-2000  . cinacalcet  180 mg Oral Q2000  . darbepoetin      . darbepoetin (ARANESP) injection - DIALYSIS  150 mcg Intravenous Q Fri-HD  . doxercalciferol      . doxercalciferol  2 mcg Intravenous Q M,W,F-HD  . heparin  5,000 Units Subcutaneous Q8H  . multivitamin  1 tablet Oral QHS  . senna-docusate  1 tablet Oral BID  . vancomycin  1,000 mg Intravenous Q M,W,F-HD    have reviewed scheduled and prn medications.  Physical Exam: General: alert, seems to be in pain Heart: tachy Lungs: poor effort, due to pain Abdomen: soft, non tender Extremities: no appreciable edema Dialysis Access: Right upper AVG   I Assessment/ Plan: Pt is [name] 52 y.o. yo male with ESRD who was admitted on 01/06/2013 with fever and bacteremia  Assessment/Plan:  1. Fever- with bacteremia- Staph aureus - MSSA ( I will place data in chart) AVG does not appear to be infected by exam or symptoms. Sometimes you can get [name] transient bacteremia in dialysis that usually responds well to IV antibiotics.  He did seem to get coverage pretty promptly but remained febrile longer than expected but now fever curve is trending down. The source for infection that I am most interested in is this chronic indwelling CT, true it does not show signs of obvious infection but needs to be stongly considered the source of this fever and bacteremia. I agree with broad spectrum abx for now.  Fever curve is improved. I will call the kidney center for any further culture data- is MSSA. Consider echo to look for endocarditis. ID service now on board, to likely taylor abx to ancef only  2 ESRD: Will continue MWF schedule via AVG.   Normally runs 3 hours and 15 min that may not be adequate. I have ordered 3 and [name] half hours today. HD normally at [address].  3 Hypertension: Normotensive, metoprolol being held 4. 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 Will hold iv iron in the setting of bacteremia.  hgb  in the 7s- due for procedure.  Will transfuse one unit of PRBC today.    5. Metabolic Bone Disease: continuing  home meds of phoslo and sensipar, as well as IV hectorol with HD 6. CT- per CTS.  For endobronchial valves today? Patient may be having second thoughts regarding that procedure. He may want redo thoracotomy.  I encouraged him to speak with Dr. Roxan Hockey.       [name] [name]   01/09/2013,8:27 AM  LOS: 3 days

## 2013-01-09 NOTE — Progress Notes (Signed)
Received fax from [facility] regarding Blood Cultures x 2 drawn at that facility.  Blood cultures grew Staphylococcus aureus x 2, sensitive to all antibiotics tested (Cipro, Clinda, Erythro, [place], Levo, Linez, Oxacillin, Tigecycline).  Paged Dr. Para Skeans with Internal Medicine and relayed results.  This patient does not require isolation.  Please let me know if you have additional questions/concerns.  Burns Spain, BSN, RN, CIC [phone].

## 2013-01-10 ENCOUNTER — Inpatient Hospital Stay (HOSPITAL_COMMUNITY): Payer: Medicare Other

## 2013-01-10 LAB — TYPE AND SCREEN
ABO/RH(D): A POS
Antibody Screen: NEGATIVE

## 2013-01-10 MED ORDER — ONDANSETRON HCL 4 MG PO TABS
4.0000 mg | ORAL_TABLET | Freq: Four times a day (QID) | ORAL | Status: DC | PRN
  Administered 2013-01-10: 4 mg via ORAL
  Filled 2013-01-10: qty 1

## 2013-01-10 MED ORDER — MENTHOL 3 MG MT LOZG
1.0000 | LOZENGE | OROMUCOSAL | Status: DC | PRN
  Administered 2013-01-10 (×2): 3 mg via ORAL
  Filled 2013-01-10: qty 9

## 2013-01-10 MED ORDER — PHENOL 1.4 % MT LIQD: 1.0000 | OROMUCOSAL | Status: DC | PRN

## 2013-01-10 NOTE — Op Note (Signed)
NAMESEKAI, [name] [name]                 ACCOUNT NO.:  [id number]  MEDICAL RECORD NO.:  [id number]  LOCATION:  [id number]                         FACILITY:  [facility]  PHYSICIAN:  Revonda Standard. Roxan Hockey, M.D.DATE OF BIRTH:  December 19, 1960  DATE OF PROCEDURE:  01/09/2013 DATE OF DISCHARGE:                              OPERATIVE REPORT   PREOPERATIVE DIAGNOSIS:  Persistent air leak after decortication.  POSTOPERATIVE DIAGNOSIS:  Persistent air leak after decortication.  PROCEDURE:  Bronchoscopy, placement of endobronchial valves in the medial and posterior basilar segmental bronchi of the right lower lobe.  SURGEON:  Revonda Standard. Roxan Hockey, MD  ASSISTANT:  None.  ANESTHESIA:  General.  FINDINGS:  Large decrease in the volume of air leak with occlusion of the medial segmental bronchus of the right lower lobe.  Additional decrease in air leak with occlusion of the posterior basilar segmental bronchus.  CLINICAL NOTE:  [name] [name] is a 52 year old gentleman, who has had a persistent air leak since having decortication for a chronic pleural effusion with severely encased lung.  He had been sent home with chest tubes, however was readmitted with fever and staphylococcal bacteremia. His chest x-ray showed persistent apical and basilar spaces and his air leak had increased in size since discharge.  The patient was offered and accepted bronchial valve placement to decrease the air leak and hopes of having the air leak resolved and the chest tubes to be able to be removed.  The indications, risks, benefits, alternatives, as well as the relative unknown nature of the procedure were discussed in detail with the patient.  He understood and accepted the risks and wished to proceed.  OPERATIVE NOTE:  [name] [name] was brought to the preoperative holding area on January 09, 2013, there.  Anesthesia prepared the patient for surgery, was taken to the operating room, anesthetized, and intubated. Flexible fiberoptic  bronchoscopy was performed via the endotracheal tube that revealed normal endobronchial anatomy and no endobronchial lesions. There were some thick clear secretions, these were nonpurulent.  The chest tubes were clamped individually and essentially all of the air leak was coming from the posterior basal tube, therefore attention was directed initially to the middle and lower lobe bronchi. The middle and lower bronchi were visualized.  A Fogarty balloon catheter was used to sequentially occlude the segmental and subsegmental bronchi and attempt to determine which segments were responsible for the leak.  Occlusion of the middle lobe resulted in no change in the air leak as did occlusion of the superior segmental bronchus.  Next, the basilar segmental bronchi of the right lower lobe were individually occluded and there was a dramatic decrease in the air leak with occlusion of the medial basilar segmental bronchus of the right lower lobe.  The fogarty catheter was removed, the sizing balloon then was placed and the bronchus sized for a 7 mm endobronchial valve.  The valve then was placed within the bronchus and deployed.  The valve was well seated, and there was a marked decrease in the air leak, however there was still a larger than desired air leak.  Occlusion of the other segmental bronchi with the Fogarty balloon revealed an additional decrease in air leak  with occlusion of the posterior segmental bronchus.  This likewise sized for a 7 mm valve which was placed in a similar fashion.  Again the valve was well seated, and after placement of this valve, there was minimal residual air leak. The bronchoscope was removed.  The chest tubes were left to -20 cm of suction.  The patient then was extubated in the operating room and taken to the postanesthetic care unit in good condition.     Revonda Standard Roxan Hockey, M.D.     SCH/MEDQ  D:  01/09/2013  T:  01/10/2013  Job:  [id number]

## 2013-01-10 NOTE — Progress Notes (Signed)
Subjective:  No new complaints, says he feels better after intrabronchial valves placed.  No issues with HD, removed 2900 Objective Vital signs in last 24 hours: Filed Vitals:   01/09/13 2340 01/09/13 2342 01/10/13 0400 01/10/13 0700  BP: 109/63  114/72   Pulse: 102  82   Temp:  99.7 F (37.6 C) 98 F (36.7 C) 99.5 F (37.5 C)  TempSrc:  Oral Oral Oral  Resp: 17  20   Height:      Weight:   96 kg (211 lb 10.3 oz)   SpO2: 93%  95%    Weight change: -0.3 kg (-10.6 oz)  Intake/Output Summary (Last 24 hours) at 01/10/13 0900 Last data filed at 01/10/13 0357  Gross per 24 hour  Intake    930 ml  Output   3289 ml  Net  -2359 ml   Labs: Basic Metabolic Panel:  Lab 0000000 0652 01/06/13 2330 01/06/13 1407  NA 137 137 135  K 4.6 5.1 5.9*  CL 95* 94* 93*  CO2 28 27 26   GLUCOSE 89 110* 104*  BUN 65* 75* 63*  CREATININE 12.98* 14.82* 14.01*  CALCIUM 9.3 10.0 10.2  ALB -- -- --  PHOS 5.1* -- --   Liver Function Tests:  Lab 01/09/13 0652 01/06/13 1407 01/06/13 1134  AST -- 13 12  ALT -- 11 11  ALKPHOS -- 70 69  BILITOT -- 0.5 0.5  PROT -- 7.2 7.1  ALBUMIN 2.2* 2.2* 2.3*   No results found for this basename: LIPASE:3,AMYLASE:3 in the last 168 hours No results found for this basename: AMMONIA:3 in the last 168 hours CBC:  Lab 01/09/13 0652 01/08/13 0900 01/06/13 1407 01/06/13 1134  WBC 11.3* 10.3 14.0* --  NEUTROABS -- -- 10.7* 10.3*  HGB 7.6* 7.7* 8.8* --  HCT 23.7* 23.5* 28.1* --  MCV 90.1 88.7 92.4 91.9  PLT 232 198 180 --   Cardiac Enzymes: No results found for this basename: CKTOTAL:5,CKMB:5,CKMBINDEX:5,TROPONINI:5 in the last 168 hours CBG:  Lab 01/06/13 2010  GLUCAP 100*    Iron Studies: No results found for this basename: IRON,TIBC,TRANSFERRIN,FERRITIN in the last 72 hours Studies/Results: Ct Chest Wo Contrast  01/08/2013  *RADIOLOGY REPORT*  Clinical Data: Right-sided chest pain.  Possible empyema.  Fever and chills.  CT CHEST WITHOUT CONTRAST   Technique:  Multidetector CT imaging of the chest was performed following the standard protocol without IV contrast.  Comparison: Chest CT 12/12/2012.  Findings:  Mediastinum: Heart size is borderline enlarged. A small amount of pericardial fluid and/or thickening, unlikely to be of hemodynamic significance.  No associated pericardial calcification. There is atherosclerosis of the thoracic aorta, the great vessels of the mediastinum and the coronary arteries, including calcified atherosclerotic plaque in the left main, left anterior descending and right coronary arteries. Numerous borderline enlarged and mildly enlarged mediastinal lymph nodes, largest of which is in the superior mediastinum immediately lateral to the origin of the innominate artery measuring 1.4 cm in short axis, slightly increased compared to prior examinations.  Esophagus is unremarkable in appearance. 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  There is also some calcification more inferiorly within the superior vena cava at the level of the superior cavoatrial junction.  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  Lungs/Pleura: Compared to  the prior examination there has been interval placement of two right-sided chest tubes, one with the tip in the posterior right costophrenic sulcus and the other with the tip directed superiorly into the right apex.  The previously noted right-sided pleural effusion is nearly completely drained, although there is some persistent gas and fluid layering dependently in the right hemithorax.  The right lung has failed to adequately expand, suggesting pneumothorax ex vacuo.  Again noted are multiple pleural based mass like opacities and  architectural distortion in the right lung, favored to represent areas of rounded atelectasis.  Profound thickening of the pleura throughout the right hemithorax is again noted.  Trace left pleural effusion layering dependently.  Left lung is clear.  Left lung is clear and well aerated.  Upper Abdomen: Previously mentioned chest wall collaterals extend into the upper abdomen and are very large.  Musculoskeletal: There are no aggressive appearing lytic or blastic lesions noted in the visualized portions of the skeleton.  IMPRESSION:  1.  Interval placement of two right-sided chest tubes with near complete drainage of the right pleural effusion compared to the prior study.  A small amount of residual dependent gas and fluid is noted in the right hemithorax. 2.  The right lung has failed to adequately re-expand, most compatible with pneumothorax ex vacuo. 3.  Extensive right-sided pleural thickening is similar to the prior examination, and there is extensive architectural distortion and pleural based mass like opacities in the right lung which are favored to represent a large areas of rounded atelectasis. 4.  Calcification in this extending from the right internal jugular vein into the superior vena cava favored to represent chronically calcified fibrin sheath and/or chronically calcified clot. Associated with this there are numerous venous collaterals throughout the chest wall and upper abdomen bilaterally.  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  Clinical correlation for signs and symptoms of the SVC syndrome is recommended. 5.  Trace left pleural effusion layering dependently is simple in appearance.   Original Report Authenticated By: Vinnie Langton, M.D.    Dg Chest Port 1 View  01/10/2013  *RADIOLOGY REPORT*  Clinical Data: Pneumothorax  PORTABLE CHEST - 1 VIEW  Comparison:   the previous day's study  Findings: Two right chest tube remain in place.   Little change in small apical pneumothorax projecting down to the level of the right fifth rib posteriorly.  Right pleural thickening is again evident. Coarse interstitial and airspace opacities in the right lung probably due to under inflation.  Left lung remains clear.  Mild cardiomegaly stable.  Regional bones unremarkable.  IMPRESSION:  1.  Stable right pneumothorax and chest tubes.   Original Report Authenticated By: D. Wallace Going, MD    Dg Chest Port 1 View  01/09/2013  *RADIOLOGY REPORT*  Clinical Data: Status post chest tube  PORTABLE CHEST - 1 VIEW  Comparison: CT chest dated 01/08/2013  Findings: Small apical hydropneumothorax with layering fluid at the right lung base.  This appearance is improved from prior CT.  Stable right apical and right basilar chest tubes.  Left lung essentially clear.  Cardiomegaly.  IMPRESSION: Small apical hydropneumothorax, improved.  Stable right apical and basilar chest tubes.   Original Report Authenticated By: Julian Hy, M.D.    Medications: Infusions:    . sodium chloride 10 mL/hr at 01/09/13 1118    Scheduled Medications:    . calcium acetate  2,001 mg Oral With snacks  . calcium acetate  2,668 mg Oral TID WC  .  ceFAZolin (ANCEF) IV  2 g Intravenous Q M,W,F-2000  . cinacalcet  180 mg Oral Q2000  . darbepoetin (ARANESP) injection - DIALYSIS  150 mcg Intravenous Q Fri-HD  . doxercalciferol  2 mcg Intravenous Q M,W,F-HD  . heparin  5,000 Units Subcutaneous Q8H  . metoprolol tartrate  25 mg Oral BID  . multivitamin  1 tablet Oral QHS  . senna-docusate  1 tablet Oral BID    have reviewed scheduled and prn medications.  Physical Exam: General: alert, seems to be in pain Heart: tachy Lungs: poor effort, due to pain Abdomen: soft, non tender Extremities: no appreciable edema Dialysis Access: Right upper AVG good thrill and bruit  I Assessment/ Plan: Pt is a 52 y.o. yo male with ESRD who was admitted on 01/06/2013 with fever and  bacteremia  Assessment/Plan:  1. Fever- with bacteremia- Staph aureus - MSSA ( I will place data in chart) AVG does not appear to be infected by exam or symptoms. Sometimes you can get a transient bacteremia in dialysis that usually responds well to IV antibiotics. He did seem to get coverage pretty promptly but remained febrile longer than expected but now fever curve is trending down. The source for infection that I am most interested in is this chronic indwelling CT, true it does not show signs of obvious infection but needs to be stongly considered the source of this fever and bacteremia. I agree with broad spectrum abx for now.  Fever curve is improved. I will call the kidney center for any further culture data- is MSSA. Consider echo to look for endocarditis. ID service now on board, on ancef only  2 ESRD: Will continue MWF schedule via AVG.   Normally runs 3 hours and 15 min that may not be adequate. I have ordered 3 and a half hours here. HD normally at [facility].  3 Hypertension: Normotensive, metoprolol being held 4. 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 Will hold iv iron in the setting of bacteremia.  hgb in the 7s- due for procedure.   transfused one unit of PRBC on 1/31 with HD   5. Metabolic Bone Disease: continuing  home meds of phoslo and sensipar, as well as IV hectorol with HD 6. CT- per CTS.  s/p endobronchial valves? Patient clinically improved.   I will not see patient tomorrow (Sunday) but will see Monday.  He will get HD on Monday    [name] [name] A   01/10/2013,9:00 AM  LOS: 4 days

## 2013-01-10 NOTE — Progress Notes (Addendum)
[address]            [place], [address]          [phone]     1 Day Post-Op Procedure(s) (LRB): VIDEO BRONCHOSCOPY WITH INSERTION OF INTERBRONCHIAL VALVE (IBV) (N/A)  Subjective: Comfortable, breathing stable.   Objective: Vital signs in last 24 hours: Patient Vitals for the past 24 hrs:  BP Temp Temp src Pulse Resp SpO2 Weight  01/10/13 0400 114/72 mmHg 98 F (36.7 C) Oral 82  20  95 % 211 lb 10.3 oz (96 kg)  01/09/13 2342 - 99.7 F (37.6 C) Oral - - - -  01/09/13 2340 109/63 mmHg - - 102  17  93 % -  01/09/13 1935 - 99.1 F (37.3 C) Oral - - - -  01/09/13 1920 119/91 mmHg - - 107  21  90 % -  01/09/13 1515 143/79 mmHg 99.5 F (37.5 C) Oral 112  17  98 % -  01/09/13 1500 140/68 mmHg 99.5 F (37.5 C) - 109  26  97 % -  01/09/13 1445 127/76 mmHg - - 112  27  97 % -  01/09/13 1430 138/73 mmHg - - 116  22  97 % -  01/09/13 1415 146/97 mmHg 100.6 F (38.1 C) - 120  27  96 % -  01/09/13 1400 128/82 mmHg - - 116  20  96 % -  01/09/13 1345 138/73 mmHg - - 112  22  97 % -  01/09/13 1330 123/70 mmHg - - 113  14  96 % -  01/09/13 1315 130/70 mmHg - - 108  23  100 % -  01/09/13 1304 129/66 mmHg 101.5 F (38.6 C) - 106  10  95 % -  01/09/13 1009 153/84 mmHg 97.9 F (36.6 C) Oral 89  16  92 % 212 lb 11.9 oz (96.5 kg)  01/09/13 0954 159/83 mmHg 98.2 F (36.8 C) Oral 97  15  - -  01/09/13 0939 149/85 mmHg 98.4 F (36.9 C) Oral 92  16  - -  01/09/13 0937 149/85 mmHg 98.9 F (37.2 C) Oral 95  20  - -  01/09/13 0930 135/70 mmHg - - 90  - - -  01/09/13 0900 135/76 mmHg - - 91  - - -  01/09/13 0831 127/68 mmHg - - 94  19  - -  01/09/13 0800 125/59 mmHg - - 99  - - -   Current Weight  01/10/13 211 lb 10.3 oz (96 kg)     Intake/Output from previous day: 01/31 0701 - 02/01 0700 In: 930 [P.O.:480; I.V.:400; IV Piggyback:50] Out: 3289 [Blood:75; Chest Tube:250]    PHYSICAL EXAM:  Heart: RRR Lungs: coarse bilateral BS Wound:  Stable Chest tube: + air leak    Lab Results: CBC: Basename 01/09/13 0652 01/08/13 0900  WBC 11.3* 10.3  HGB 7.6* 7.7*  HCT 23.7* 23.5*  PLT 232 198   BMET:  Basename 01/09/13 0652  NA 137  K 4.6  CL 95*  CO2 28  GLUCOSE 89  BUN 65*  CREATININE 12.98*  CALCIUM 9.3    PT/INR: No results found for this basename: LABPROT,INR in the last 72 hours    CXR: Findings: Two right chest tube remain in place. Little change in  small apical pneumothorax projecting down to the level of the right  fifth  rib posteriorly. Right pleural thickening is again evident.  Coarse interstitial and airspace opacities in the right lung  probably due to under inflation. Left lung remains clear. Mild  cardiomegaly stable. Regional bones unremarkable.  IMPRESSION:  1. Stable right pneumothorax and chest tubes.   Assessment/Plan: S/P Procedure(s) (LRB): VIDEO BRONCHOSCOPY WITH INSERTION OF INTERBRONCHIAL VALVE (IBV) (N/A) Continue CTs to suction for now. Mobilize, work on [employer] ESRD- per nephrology, HD MWF. HTN- BPs generally stable. Chronic anemia- stable.    LOS: 4 days    COLLINS,GINA H 01/10/2013    Chart reviewed, patient examined, agree with above.

## 2013-01-11 ENCOUNTER — Inpatient Hospital Stay (HOSPITAL_COMMUNITY): Payer: Medicare Other

## 2013-01-11 LAB — CBC
HCT: 25.7 % — ABNORMAL LOW (ref 39.0–52.0)
Hemoglobin: 8.1 g/dL — ABNORMAL LOW (ref 13.0–17.0)
RDW: 16.1 % — ABNORMAL HIGH (ref 11.5–15.5)
WBC: 12.3 10*3/uL — ABNORMAL HIGH (ref 4.0–10.5)

## 2013-01-11 LAB — BASIC METABOLIC PANEL
Chloride: 94 mEq/L — ABNORMAL LOW (ref 96–112)
Creatinine, Ser: 11.96 mg/dL — ABNORMAL HIGH (ref 0.50–1.35)
GFR calc Af Amer: 5 mL/min — ABNORMAL LOW (ref >90)
GFR calc non Af Amer: 4 mL/min — ABNORMAL LOW (ref >90)
Potassium: 4.8 mEq/L (ref 3.5–5.1)

## 2013-01-11 NOTE — Progress Notes (Addendum)
                    [address]            ,[place] [address]          [phone]     2 Days Post-Op Procedure(s) (LRB): VIDEO BRONCHOSCOPY WITH INSERTION OF INTERBRONCHIAL VALVE (IBV) (N/A)  Subjective: OOB in chair.  Feels well, no complaints.   Objective: Vital signs in last 24 hours: Patient Vitals for the past 24 hrs:  BP Temp Temp src Pulse Resp SpO2 Weight  01/11/13 0625 - - - 76  0  96 % -  01/11/13 0339 114/66 mmHg 97.9 F (36.6 C) Oral 77  7  94 % 208 lb 1.8 oz (94.4 kg)  01/11/13 0222 - - - 80  13  94 % -  01/10/13 2354 110/67 mmHg 99 F (37.2 C) Oral 103  16  92 % -  01/10/13 2340 - - - 104  17  93 % -  01/10/13 2223 121/77 mmHg - - 97  - - -  01/10/13 2200 - - - 95  16  91 % -  01/10/13 2000 - - - 99  20  95 % -  01/10/13 1953 106/63 mmHg 99.2 F (37.3 C) Oral 98  18  95 % -  01/10/13 1627 118/64 mmHg 98.4 F (36.9 C) Oral 88  - 96 % -  01/10/13 1200 136/64 mmHg 98.9 F (37.2 C) Oral 83  17  94 % -  01/10/13 1015 116/63 mmHg - - 84  5  92 % -   Current Weight  01/11/13 208 lb 1.8 oz (94.4 kg)     Intake/Output from previous day: 02/01 0701 - 02/02 0700 In: 360 [P.O.:360] Out: 125 [Chest Tube:125]    PHYSICAL EXAM:  Heart: RRR Lungs: Few crackles in bases bilaterally Wound: Clean and dry Chest tube: intermittent 1/7 air leak with cough   Lab Results: CBC: Basename 01/11/13 0533 01/09/13 0652  WBC 12.3* 11.3*  HGB 8.1* 7.6*  HCT 25.7* 23.7*  PLT 260 232   BMET:  Basename 01/11/13 0533 01/09/13 0652  NA 137 137  K 4.8 4.6  CL 94* 95*  CO2 30 28  GLUCOSE 93 89  BUN 61* 65*  CREATININE 11.96* 12.98*  CALCIUM 10.0 9.3    PT/INR: No results found for this basename: LABPROT,INR in the last 72 hours   CXR: Findings: Two right chest tubes remain in place. There is a small  basilar pneumothorax. Atelectasis/consolidation in the right upper  lung and laterally at the right lung base as before. Left lung is  clear.  Mild cardiomegaly stable. There is right pleural  thickening or effusion.  IMPRESSION:  1. Stable right chest tubes with small basilar pneumothorax   Assessment/Plan: S/P Procedure(s) (LRB): VIDEO BRONCHOSCOPY WITH INSERTION OF INTERBRONCHIAL VALVE (IBV) (N/A) Air leak looks significantly improved today.  Will continue CT to suction, but may be able to decrease to water seal soon. ESRD- for HD in am. Continue medical care per renal.    LOS: 5 days    COLLINS,GINA H 01/11/2013    Chart reviewed, patient examined, agree with above. Air leak has stopped even with cough. Will put chest tube to water seal.

## 2013-01-12 ENCOUNTER — Inpatient Hospital Stay (HOSPITAL_COMMUNITY): Payer: Medicare Other

## 2013-01-12 ENCOUNTER — Encounter (HOSPITAL_COMMUNITY): Payer: Self-pay | Admitting: Thoracic Surgery (Cardiothoracic Vascular Surgery)

## 2013-01-12 DIAGNOSIS — R7881 Bacteremia: Secondary | ICD-10-CM

## 2013-01-12 LAB — CBC
HCT: 25.6 % — ABNORMAL LOW (ref 39.0–52.0)
Hemoglobin: 8.1 g/dL — ABNORMAL LOW (ref 13.0–17.0)
MCH: 28.8 pg (ref 26.0–34.0)
MCHC: 31.6 g/dL (ref 30.0–36.0)
RBC: 2.81 MIL/uL — ABNORMAL LOW (ref 4.22–5.81)

## 2013-01-12 LAB — CULTURE, RESPIRATORY W GRAM STAIN: Culture: NO GROWTH

## 2013-01-12 LAB — CULTURE, BLOOD (ROUTINE X 2): Culture: NO GROWTH

## 2013-01-12 LAB — RENAL FUNCTION PANEL
BUN: 82 mg/dL — ABNORMAL HIGH (ref 6–23)
CO2: 26 mEq/L (ref 19–32)
Calcium: 10.4 mg/dL (ref 8.4–10.5)
GFR calc Af Amer: 4 mL/min — ABNORMAL LOW (ref >90)
Glucose, Bld: 99 mg/dL (ref 70–99)
Phosphorus: 6.1 mg/dL — ABNORMAL HIGH (ref 2.3–4.6)

## 2013-01-12 MED ORDER — HEPARIN SODIUM (PORCINE) 1000 UNIT/ML DIALYSIS: 20.0000 [IU]/kg | INTRAMUSCULAR | Status: DC | PRN

## 2013-01-12 MED ORDER — PENTAFLUOROPROP-TETRAFLUOROETH EX AERO: 1.0000 "application " | INHALATION_SPRAY | CUTANEOUS | Status: DC | PRN

## 2013-01-12 MED ORDER — NEPRO/CARBSTEADY PO LIQD
237.0000 mL | ORAL | Status: DC | PRN
  Filled 2013-01-12: qty 237

## 2013-01-12 MED ORDER — DOXERCALCIFEROL 4 MCG/2ML IV SOLN
INTRAVENOUS | Status: AC
  Administered 2013-01-12: 2 ug via INTRAVENOUS
  Filled 2013-01-12: qty 2

## 2013-01-12 MED ORDER — PSYLLIUM 95 % PO PACK
1.0000 | PACK | Freq: Every day | ORAL | Status: DC
  Administered 2013-01-12 – 2013-01-13 (×2): 1 via ORAL
  Filled 2013-01-12 (×9): qty 1

## 2013-01-12 MED ORDER — LIDOCAINE HCL (PF) 1 % IJ SOLN: 5.0000 mL | INTRAMUSCULAR | Status: DC | PRN

## 2013-01-12 MED ORDER — SODIUM CHLORIDE 0.9 % IV SOLN: 100.0000 mL | INTRAVENOUS | Status: DC | PRN

## 2013-01-12 MED ORDER — HEPARIN SODIUM (PORCINE) 1000 UNIT/ML DIALYSIS: 1000.0000 [IU] | INTRAMUSCULAR | Status: DC | PRN

## 2013-01-12 MED ORDER — ALTEPLASE 2 MG IJ SOLR
2.0000 mg | Freq: Once | INTRAMUSCULAR | Status: DC | PRN
  Filled 2013-01-12: qty 2

## 2013-01-12 MED ORDER — LIDOCAINE-PRILOCAINE 2.5-2.5 % EX CREA
1.0000 "application " | TOPICAL_CREAM | CUTANEOUS | Status: DC | PRN
  Filled 2013-01-12: qty 5

## 2013-01-12 NOTE — Progress Notes (Signed)
Patient's anterior CT d/c'd (by Pecolia Ades, RN) per MD order.  Patient tolerated well.  Posterior CT placed on 20 cm suction per MD order. Will continue to monitor.

## 2013-01-12 NOTE — Progress Notes (Signed)
ANTIBIOTIC CONSULT NOTE - FOLLOW UP  Pharmacy Consult for Cefazolin Indication: MSSA bacteremia  No Known Allergies  Patient Measurements: Height: 5\' 4"  (162.6 cm) Weight: 207 lb 10.8 oz (94.2 kg) IBW/kg (Calculated) : 59.2   Vital Signs: Temp: 97.9 F (36.6 C) (02/03 0800) Temp src: Oral (02/03 0800) BP: 111/79 mmHg (02/03 0800) Pulse Rate: 85  (02/03 0435) Intake/Output from previous day: 02/02 0701 - 02/03 0700 In: -  Out: 150 [Chest Tube:150] Intake/Output from this shift: Total I/O In: 200 [P.O.:200] Out: 20 [Chest Tube:20]  Labs:  [facility] 01/11/13 0533  WBC 12.3*  HGB 8.1*  PLT 260  LABCREA --  CREATININE 11.96*   Estimated Creatinine Clearance: 7.7 ml/min (by C-G formula based on Cr of 11.96).    Microbiology:  Vanc 1/28 >>1/31 Cefepime 1 dose on 1/28 Zosyn 1/29 >>1/29 Ancef 1/29>>  1/27 BCx3 ([facility]) > MSSA 1/28 BCx2 > ngtd 1/29 BCx1 > ngtd 1/31 Resp>> no growth F  Assessment: 52 yo M with MSSA bacteremia per OSH records.  Patient continues on Cefazolin with HD.  Currently Afeb.  WBC 12.3.  No monitoring of levels is needed with Cefazolin dosing.  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  Awaiting clearance from TCTS to begin anticoagulation.  Goal of Therapy:  Eradication of infection; Renal dose adjustment  Plan:  Continue Ancef 2gm IV MWF with HD. Will follow-up plans for anticoagulation for VTE.  [employer], Pharm.D., BCPS Clinical Pharmacist Pager [phone] 01/12/2013 10:27 AM

## 2013-01-12 NOTE — Progress Notes (Signed)
Pt system clotted after moving his arm during tx with an hour of tx left; air noted in the venous chamber, tried to remove the air and the system clotted. Unable to rinseback blood; pt stated he did not want to finish tx because he was tired, tried to make him complete the tx pt refused. Dr. Lorrene Reid notified and is aware;Pt given an early termination for to sign.

## 2013-01-12 NOTE — Progress Notes (Addendum)
[address]            [employer] [id number]          [phone]     3 Days Post-Op  Procedure(s) (LRB): VIDEO BRONCHOSCOPY WITH INSERTION OF INTERBRONCHIAL VALVE (IBV) (N/A) Subjective Feels ok, tired  Objective  Temp:  [97.9 F (36.6 C)-99.2 F (37.3 C)] 97.9 F (36.6 C) (02/03 0800) Pulse Rate:  [82-98] 85  (02/03 0435) Resp:  [14-24] 16  (02/03 0800) BP: (105-138)/(52-98) 111/79 mmHg (02/03 0800) SpO2:  [94 %-99 %] 95 % (02/03 0800) Weight:  [207 lb 10.8 oz (94.2 kg)] 207 lb 10.8 oz (94.2 kg) (02/03 0500)   Intake/Output Summary (Last 24 hours) at 01/12/13 0924 Last data filed at 01/12/13 0800  Gross per 24 hour  Intake    200 ml  Output    170 ml  Net     30 ml       General appearance: alert, cooperative and no distress Heart: regular rate and rhythm Lungs: mildly dimin in bases Abdomen: soft, nontender Extremities: no edema Wound: dressings CDI  Lab Results:  [facility] 01/11/13 0533  NA 137  K 4.8  CL 94*  CO2 30  GLUCOSE 93  BUN 61*  CREATININE 11.96*  CALCIUM 10.0  MG --  PHOS --   No results found for this basename: AST:2,ALT:2,ALKPHOS:2,BILITOT:2,PROT:2,ALBUMIN:2 in the last 72 hours No results found for this basename: LIPASE:2,AMYLASE:2 in the last 72 hours  Basename 01/11/13 0533  WBC 12.3*  NEUTROABS --  HGB 8.1*  HCT 25.7*  MCV 91.5  PLT 260   No results found for this basename: CKTOTAL:4,CKMB:4,TROPONINI:4 in the last 72 hours No components found with this basename: POCBNP:3 No results found for this basename: DDIMER in the last 72 hours No results found for this basename: HGBA1C in the last 72 hours No results found for this basename: CHOL,HDL,LDLCALC,TRIG,CHOLHDL in the last 72 hours No results found for this basename: TSH,T4TOTAL,FREET3,T3FREE,THYROIDAB in the last 72 hours No results found for this basename: VITAMINB12,FOLATE,FERRITIN,TIBC,IRON,RETICCTPCT in the last 72  hours  Medications: Scheduled    . calcium acetate  2,001 mg Oral With snacks  . calcium acetate  2,668 mg Oral TID WC  .  ceFAZolin (ANCEF) IV  2 g Intravenous Q M,W,F-2000  . cinacalcet  180 mg Oral Q2000  . darbepoetin (ARANESP) injection - DIALYSIS  150 mcg Intravenous Q Fri-HD  . doxercalciferol  2 mcg Intravenous Q M,W,F-HD  . heparin  5,000 Units Subcutaneous Q8H  . metoprolol tartrate  25 mg Oral BID  . multivitamin  1 tablet Oral QHS  . senna-docusate  1 tablet Oral BID     Radiology/Studies:  Dg Chest Port 1 View  01/12/2013  *RADIOLOGY REPORT*  Clinical Data: The right pneumothorax, follow-up  PORTABLE CHEST - 1 VIEW  Comparison: Portable chest x-ray of 01/11/2013  Findings: A right basilar pneumothorax remains with right chest tubes unchanged in position.  Pleural and parenchymal opacity throughout the right hemithorax is stable.  The left lung is clear. Heart size is stable.  IMPRESSION:  1.  No change in the right pneumothorax with two right chest tubes. 2.  No change in pleural and parenchymal opacity throughout the right hemithorax.   Original Report Authenticated By: Ivar Drape, M.D.    Dg Chest Port 1 View  01/11/2013  *RADIOLOGY REPORT*  Clinical Data: Air  leak, pneumothorax  PORTABLE CHEST - 1 VIEW  Comparison:   the previous day's study  Findings: Two right chest tubes remain in place.  There is a small basilar pneumothorax.  Atelectasis/consolidation in the right upper lung and laterally at the right lung base as before.  Left lung is clear.  Mild cardiomegaly stable.  There is right pleural thickening or effusion.  IMPRESSION:  1.  Stable right chest tubes with small basilar pneumothorax   Original Report Authenticated By: D. Wallace Going, MD     Chest tube: large air leak with cough currently, 150 cc out yesterday   INR: Will add last result for INR, ABG once components are confirmed Will add last 4 CBG results once components are confirmed  Assessment/Plan: S/P  Procedure(s) (LRB): VIDEO BRONCHOSCOPY WITH INSERTION OF INTERBRONCHIAL VALVE (IBV) (N/A)  1 cont CT to H2O seal 2 for dialysis today 3 add psyllium laxative    LOS: 6 days    GOLD,WAYNE E 2/3/20149:24 AM    Has a small basilar space and a very small air leak- will d/c anterior CT(no air leak from it) and put posterior tube back on suction to try to eliminate the space.  TEE per Dr. Hale Bogus recommendation  Continue Ancef for MSSA

## 2013-01-12 NOTE — Progress Notes (Addendum)
Subjective:  No new complaints Says he just feels tired Very apathetic today Due for dialysis today, says he feels better after interbronchial valves placed.    Objective Vital signs in last 24 hours: Filed Vitals:   01/11/13 2308 01/12/13 0435 01/12/13 0500 01/12/13 0800  BP: 114/66 138/98  111/79  Pulse: 98 85    Temp: 99.2 F (37.3 C) 98.6 F (37 C)  97.9 F (36.6 C)  TempSrc: Oral Oral  Oral  Resp: 19 14  16   Height:      Weight:   94.2 kg (207 lb 10.8 oz)   SpO2: 96% 94%  95%   Weight change: -0.2 kg (-7.1 oz)  Intake/Output Summary (Last 24 hours) at 01/12/13 1115 Last data filed at 01/12/13 0800  Gross per 24 hour  Intake    200 ml  Output    170 ml  Net     30 ml   Physical Exam: General: alert, seems very apathetic but denies pain or SOB Heart: s1s2 no s3 Lungs: poor effort, grossly clear  Chest tubes (2) in place Abdomen: soft, non tender Extremities: no appreciable edema Dialysis Access: Right upper AVG good thrill and bruit  Labs: Basic Metabolic Panel:  Lab [id number] 0533 01/09/13 0652 01/06/13 2330  NA 137 137 137  K 4.8 4.6 5.1  CL 94* 95* 94*  CO2 30 28 27   GLUCOSE 93 89 110*  BUN 61* 65* 75*  CREATININE 11.96* 12.98* 14.82*  CALCIUM 10.0 9.3 10.0  ALB -- -- --  PHOS -- 5.1* --   Liver Function Tests:  Lab 01/09/13 0652 01/06/13 1407 01/06/13 1134  AST -- 13 12  ALT -- 11 11  ALKPHOS -- 70 69  BILITOT -- 0.5 0.5  PROT -- 7.2 7.1  ALBUMIN 2.2* 2.2* 2.3*   No results found for this basename: LIPASE:3,AMYLASE:3 in the last 168 hours No results found for this basename: AMMONIA:3 in the last 168 hours CBC:  Lab 01/11/13 0533 01/09/13 0652 01/08/13 0900 01/06/13 1407 01/06/13 1134  WBC 12.3* 11.3* 10.3 -- --  NEUTROABS -- -- -- 10.7* 10.3*  HGB 8.1* 7.6* 7.7* -- --  HCT 25.7* 23.7* 23.5* -- --  MCV 91.5 90.1 88.7 92.4 91.9  PLT 260 232 198 -- --   Cardiac Enzymes: No results found for this basename:  CKTOTAL:5,CKMB:5,CKMBINDEX:5,TROPONINI:5 in the last 168 hours CBG:  Lab 01/06/13 2010  GLUCAP 100*    Iron Studies: No results found for this basename: IRON,TIBC,TRANSFERRIN,FERRITIN in the last 72 hours Studies/Results: Dg Chest Port 1 View  01/12/2013  *RADIOLOGY REPORT*  Clinical Data: The right pneumothorax, follow-up  PORTABLE CHEST - 1 VIEW  Comparison: Portable chest x-ray of 01/11/2013  Findings: A right basilar pneumothorax remains with right chest tubes unchanged in position.  Pleural and parenchymal opacity throughout the right hemithorax is stable.  The left lung is clear. Heart size is stable.  IMPRESSION:  1.  No change in the right pneumothorax with two right chest tubes. 2.  No change in pleural and parenchymal opacity throughout the right hemithorax.   Original Report Authenticated By: Ivar Drape, M.D.    Dg Chest Port 1 View  01/11/2013  *RADIOLOGY REPORT*  Clinical Data: Air leak, pneumothorax  PORTABLE CHEST - 1 VIEW  Comparison:   the previous day's study  Findings: Two right chest tubes remain in place.  There is a small basilar pneumothorax.  Atelectasis/consolidation in the right upper lung and laterally at the right lung  base as before.  Left lung is clear.  Mild cardiomegaly stable.  There is right pleural thickening or effusion.  IMPRESSION:  1.  Stable right chest tubes with small basilar pneumothorax   Original Report Authenticated By: D. Wallace Going, MD    Medications: Infusions:    Scheduled Medications:    . calcium acetate  2,001 mg Oral With snacks  . calcium acetate  2,668 mg Oral TID WC  .  ceFAZolin (ANCEF) IV  2 g Intravenous Q M,W,F-2000  . cinacalcet  180 mg Oral Q2000  . darbepoetin (ARANESP) injection - DIALYSIS  150 mcg Intravenous Q Fri-HD  . doxercalciferol  2 mcg Intravenous Q M,W,F-HD  . heparin  5,000 Units Subcutaneous Q8H  . metoprolol tartrate  25 mg Oral BID  . multivitamin  1 tablet Oral QHS  . psyllium  1 packet Oral Daily  .  senna-docusate  1 tablet Oral BID    have reviewed scheduled and prn medications.  Dialyzes at [facility] on MWF Primary Nephrologist [place]. EDW 100, time 3.15  HD Bath 2 K, 2 Ca, Heparin no. Access Right upper arm AVG.  Assessment/ Plan: Pt is a 52 y.o. yo male with ESRD who was admitted on 01/06/2013 with fever and bacteremia; had recent (last admission) VATS for pleural effusion with chest tubes in place   1. Fever- with bacteremia- Staph aureus - MSSA ( data in paper chart)  AVG does not appear to be infected by exam or symptoms.  Sometimes you can get a transient bacteremia in dialysis that usually responds well to IV antibiotics.  He did seem to get coverage pretty promptly but remained febrile longer than expected but now fever curve is trending down.  The source for infection that I am most interested in is this chronic indwelling CT, true it does not show signs of obvious infection but needs to be stongly considered the source of this fever and bacteremia.  Fever curve is improved. ID service now on board, on ancef only   2 ESRD:  Will continue MWF schedule via AVG.    Normally runs 3 hours and 15 min that may not be adequate.  Ordered 3 and a half hours here.  HD normally at [place].   3 Hypertension:  Normotensive, metoprolol being held  4. Anemia of ESRD:  Will give ESA, is on moderate dosing as OP.  Will hold iv iron in the setting of bacteremia.    transfused one unit of PRBC on 1/31 with HD    5. Metabolic Bone Disease:  continuing  home meds of phoslo and sensipar, as well as IV hectorol with HD  6. CT-  per CTS.  s/p enterbronchial valves Patient clinically improved.  Dr. Roxan Hockey seeing patient now    Niang Mitcheltree B   01/12/2013,11:15 AM  LOS: 6 days

## 2013-01-12 NOTE — Progress Notes (Signed)
Patient ID: [name] [name], male   DOB: Sep 09, 1961, 52 y.o.   MRN: [id number]    [facility] for Infectious Disease    Date of Admission:  01/06/2013           Day 7 antibiotics Principal Problem:  *Bacteremia Active Problems:  End-stage renal disease on hemodialysis  Anemia of chronic disease      . calcium acetate  2,001 mg Oral With snacks  . calcium acetate  2,668 mg Oral TID WC  .  ceFAZolin (ANCEF) IV  2 g Intravenous Q M,W,F-2000  . cinacalcet  180 mg Oral Q2000  . darbepoetin (ARANESP) injection - DIALYSIS  150 mcg Intravenous Q Fri-HD  . doxercalciferol  2 mcg Intravenous Q M,W,F-HD  . heparin  5,000 Units Subcutaneous Q8H  . metoprolol tartrate  25 mg Oral BID  . multivitamin  1 tablet Oral QHS  . psyllium  1 packet Oral Daily  . senna-docusate  1 tablet Oral BID    Subjective: Denies complaints  Objective: Temp:  [97.9 F (36.6 C)-99.2 F (37.3 C)] 98.1 F (36.7 C) (02/03 1133) Pulse Rate:  [85-98] 85  (02/03 0435) Resp:  [14-24] 16  (02/03 0800) BP: (105-138)/(52-98) 111/79 mmHg (02/03 0800) SpO2:  [94 %-99 %] 95 % (02/03 0800) Weight:  [94.2 kg (207 lb 10.8 oz)] 94.2 kg (207 lb 10.8 oz) (02/03 0500)  General: Very lethargic, falls off to sleep during exam Skin: No split or conjunctival hemorrhages Lungs: Clear; right chest tubes and Cor: Distant heart sounds, no murmur heard Abdomen: Soft and nontender Right upper arm hemodialysis fistula appears normal  Lab Results Lab Results  Component Value Date   WBC 12.3* 01/11/2013   HGB 8.1* 01/11/2013   HCT 25.7* 01/11/2013   MCV 91.5 01/11/2013   PLT 260 01/11/2013    Lab Results  Component Value Date   CREATININE 11.96* 01/11/2013   BUN 61* 01/11/2013   NA 137 01/11/2013   K 4.8 01/11/2013   CL 94* 01/11/2013   CO2 30 01/11/2013    Microbiology: Recent Results (from the past 240 hour(s))  CULTURE, BLOOD (ROUTINE X 2)     Status: Normal   Collection Time   01/06/13 11:34 AM      Component Value Range Status  Comment   Specimen Description BLOOD ARM LEFT   Final    Special Requests BOTTLES DRAWN AEROBIC AND ANAEROBIC 10CC   Final    Culture  Setup Time 01/06/2013 18:01   Final    Culture NO GROWTH 5 DAYS   Final    Report Status 01/12/2013 FINAL   Final   CULTURE, BLOOD (ROUTINE X 2)     Status: Normal   Collection Time   01/06/13 11:45 AM      Component Value Range Status Comment   Specimen Description BLOOD ARM LEFT   Final    Special Requests BOTTLES DRAWN AEROBIC AND ANAEROBIC 10CC   Final    Culture  Setup Time 01/06/2013 18:01   Final    Culture NO GROWTH 5 DAYS   Final    Report Status 01/12/2013 FINAL   Final   MRSA PCR SCREENING     Status: Normal   Collection Time   01/07/13 12:18 AM      Component Value Range Status Comment   MRSA by PCR NEGATIVE  NEGATIVE Final   CULTURE, BLOOD (SINGLE)     Status: Normal (Preliminary result)   Collection Time   01/07/13  2:40 AM      Component Value Range Status Comment   Specimen Description BLOOD LEFT HAND   Final    Special Requests BOTTLES DRAWN AEROBIC ONLY [facility]   Final    Culture  Setup Time 01/07/2013 09:19   Final    Culture     Final    Value:        BLOOD CULTURE RECEIVED NO GROWTH TO DATE CULTURE WILL BE HELD FOR 5 DAYS BEFORE ISSUING A FINAL NEGATIVE REPORT   Report Status PENDING   Incomplete   CULTURE, RESPIRATORY     Status: Normal   Collection Time   01/09/13 12:45 PM      Component Value Range Status Comment   Specimen Description BRONCHIAL WASHINGS   Final    Special Requests PT ON VANCOMYCIN   Final    Gram Stain     Final    Value: RARE WBC PRESENT, PREDOMINANTLY MONONUCLEAR     NO ORGANISMS SEEN   Culture NO GROWTH 2 DAYS   Final    Report Status 01/12/2013 FINAL   Final     Studies/Results: Dg Chest Port 1 View  01/12/2013  *RADIOLOGY REPORT*  Clinical Data: The right pneumothorax, follow-up  PORTABLE CHEST - 1 VIEW  Comparison: Portable chest x-ray of 01/11/2013  Findings: A right basilar pneumothorax remains with  right chest tubes unchanged in position.  Pleural and parenchymal opacity throughout the right hemithorax is stable.  The left lung is clear. Heart size is stable.  IMPRESSION:  1.  No change in the right pneumothorax with two right chest tubes. 2.  No change in pleural and parenchymal opacity throughout the right hemithorax.   Original Report Authenticated By: Ivar Drape, M.D.    Dg Chest Port 1 View  01/11/2013  *RADIOLOGY REPORT*  Clinical Data: Air leak, pneumothorax  PORTABLE CHEST - 1 VIEW  Comparison:   the previous day's study  Findings: Two right chest tubes remain in place.  There is a small basilar pneumothorax.  Atelectasis/consolidation in the right upper lung and laterally at the right lung base as before.  Left lung is clear.  Mild cardiomegaly stable.  There is right pleural thickening or effusion.  IMPRESSION:  1.  Stable right chest tubes with small basilar pneumothorax   Original Report Authenticated By: D. Wallace Going, MD     Assessment: His MSSA bacteremia is responding to antibiotic therapy. Would recommend a transesophageal echocardiogram to help determine duration of antibiotic therapy.  Plan: 1. Continue cefazolin 2. TEE  Michel Bickers, MD [facility] [phone] pager   [phone] cell 01/12/2013, 11:42 AM

## 2013-01-13 ENCOUNTER — Inpatient Hospital Stay (HOSPITAL_COMMUNITY): Payer: Medicare Other

## 2013-01-13 DIAGNOSIS — J90 Pleural effusion, not elsewhere classified: Secondary | ICD-10-CM

## 2013-01-13 DIAGNOSIS — B9689 Other specified bacterial agents as the cause of diseases classified elsewhere: Secondary | ICD-10-CM

## 2013-01-13 DIAGNOSIS — N186 End stage renal disease: Secondary | ICD-10-CM

## 2013-01-13 LAB — CULTURE, BLOOD (SINGLE): Culture: NO GROWTH

## 2013-01-13 LAB — PROTIME-INR: Prothrombin Time: 15.8 seconds — ABNORMAL HIGH (ref 11.6–15.2)

## 2013-01-13 MED ORDER — HEPARIN BOLUS VIA INFUSION
4000.0000 [IU] | Freq: Once | INTRAVENOUS | Status: DC
  Filled 2013-01-13: qty 4000

## 2013-01-13 MED ORDER — COUMADIN BOOK
Freq: Once | Status: DC
  Filled 2013-01-13: qty 1

## 2013-01-13 MED ORDER — WARFARIN VIDEO
Freq: Once | Status: AC
  Administered 2013-01-14: 20:00:00

## 2013-01-13 MED ORDER — POLYETHYLENE GLYCOL 3350 17 G PO PACK
17.0000 g | PACK | Freq: Every day | ORAL | Status: DC | PRN
  Filled 2013-01-13: qty 1

## 2013-01-13 MED ORDER — WARFARIN SODIUM 5 MG PO TABS
5.0000 mg | ORAL_TABLET | Freq: Every day | ORAL | Status: DC
  Administered 2013-01-13 – 2013-01-18 (×6): 5 mg via ORAL
  Filled 2013-01-13 (×8): qty 1

## 2013-01-13 MED ORDER — WARFARIN - PHYSICIAN DOSING INPATIENT
Freq: Every day | Status: DC
  Administered 2013-01-13 – 2013-01-17 (×3)

## 2013-01-13 MED ORDER — HEPARIN (PORCINE) IN NACL 100-0.45 UNIT/ML-% IJ SOLN
1400.0000 [IU]/h | INTRAMUSCULAR | Status: DC
  Filled 2013-01-13: qty 250

## 2013-01-13 MED ORDER — LACTULOSE 10 GM/15ML PO SOLN
30.0000 g | Freq: Every day | ORAL | Status: DC | PRN
  Administered 2013-01-13 – 2013-01-15 (×2): 30 g via ORAL
  Filled 2013-01-13 (×2): qty 45

## 2013-01-13 NOTE — Consult Note (Addendum)
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 Roxan Hockey  History of Present Illness: [name] [name] is a 52 y.o. male with a PMHx of ESRD on HD, right-sided pleural effusion, who presented to [facility] for evaluation of fever on 01/06/13.  The patient is on MWF hemodialysis schedule in [place], and had to discontinue his HD session 2 hours early yesterday secondary to fever/chills. He had positive BC and was started on ABX He is dialyzed through AVG RUE without difficulty.  On 12/18/12 he had a Right video-assisted thoracoscopy, drainage of pleural  effusion, decortication of right lung for Loculated right pleural effusion. Chest CT done on 01/08/13 revealed Calcification in this extending from the right internal jugular  vein into the superior vena cava favored to represent chronically  calcified fibrin sheath and/or chronically calcified clot.  Associated with this there are numerous venous collaterals  throughout the chest wall and upper abdomen bilaterally. 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   We were asked to evaluate for recommendations regarding CT findings   Past Medical History  Diagnosis Date  . End-stage renal disease on hemodialysis   . Secondary hyperparathyroidism   . Hypertension   . Polysubstance abuse   . Anemia of chronic disease     BL Hgb ~10  . Tobacco abuse     Past Surgical History  Procedure Date  . Surgery for horseshoe kidney   . Colonoscopy 12/16/2012    Procedure: COLONOSCOPY;  Surgeon: Milus Banister, MD;  Location: [facility];  Service: Endoscopy;  Laterality: N/A;  . Video assisted thoracoscopy 12/18/2012    Procedure: VIDEO ASSISTED THORACOSCOPY;  Surgeon: Melrose Nakayama, MD;  Location: [facility];  Service: Thoracic;  Laterality:  Right;  pleural peel  . Pleural effusion drainage 12/18/2012    Procedure: DRAINAGE OF PLEURAL EFFUSION;  Surgeon: Melrose Nakayama, MD;  Location: [facility];  Service: Thoracic;  Laterality: Right;  . Video bronchoscopy with insertion of interbronchial valve (ibv) 01/09/2013    Procedure: VIDEO BRONCHOSCOPY WITH INSERTION OF INTERBRONCHIAL VALVE (IBV);  Surgeon: Melrose Nakayama, MD;  Location: [facility];  Service: Thoracic;  Laterality: N/A;  placement of 2 Intrabronchial valves in right lower lobe     ROS: [x]  Positive  [ ]  Denies    General: [ ]  Weight loss, [ ]  Fever, [ ]  chills Neurologic: [ ]  Dizziness, [ ]  Blackouts, [ ]  Seizure [ ]  Stroke, [ ]  "Mini stroke", [ ]  Slurred speech, [ ]  Temporary blindness; [ ]  weakness in arms or legs, [ ]  Hoarseness Cardiac: [ ]  Chest pain/pressure, [ ]  Shortness of breath at rest [ ]  Shortness of breath with exertion, [ ]  Atrial fibrillation or irregular heartbeat Vascular: [ ]  Pain in legs with walking, [ ]  Pain in legs at rest, [ ]  Pain in legs at night,  [ ]  Non-healing ulcer, [ ]  Blood clot in vein/DVT,   Pulmonary: [ ]  Home oxygen, [ ]  Productive cough, [ ]  Coughing up blood, [ ]  Asthma,  [ ]  Wheezing Musculoskeletal:  [ ]  Arthritis, [ ]  Low back pain, [ ]  Joint pain Hematologic: [ ]  Easy Bruising, [ ]  Anemia; [ ]  Hepatitis Gastrointestinal: [ ]  Blood in stool, [ ]  Gastroesophageal Reflux/heartburn, [ ]  Trouble swallowing Urinary: [ ]  chronic Kidney disease, [ ]   on HD - [ ]  MWF or [ ]  TTHS, [ ]  Burning with urination, [ ]  Difficulty urinating Skin: [ ]  Rashes, [ ]  Wounds Psychological: [ ]  Anxiety, [ ]  Depression  Social History History  Substance Use Topics  . Smoking status: Current Every Day Smoker -- 0.5 packs/day for 20 years    Types: Cigarettes  . Smokeless tobacco: Not on file  . Alcohol Use: Yes     Comment: occasional    Family History Family History  Problem Relation Age of Onset  . Thyroid disease Mother   . Renal Disease       No Known Allergies  Current Facility-Administered Medications  Medication Dose Route Frequency Provider Last Rate Last Dose  . acetaminophen (TYLENOL) tablet 650 mg  650 mg Oral Q6H PRN Dorothy Spark, MD   650 mg at 01/07/13 2021  . calcium acetate (PHOSLO) capsule 2,001 mg  2,001 mg Oral With snacks Olga Millers, MD   2,001 mg at 01/08/13 1115  . calcium acetate (PHOSLO) capsule 2,668 mg  2,668 mg Oral TID WC Olga Millers, MD   2,668 mg at 01/13/13 0751  . ceFAZolin (ANCEF) IVPB 2 g/50 mL premix  2 g Intravenous Q M,W,F-2000 [place] , PHARMD   2 g at 01/09/13 2038  . cinacalcet (SENSIPAR) tablet 180 mg  180 mg Oral Q2000 Olga Millers, MD   180 mg at 01/11/13 2030  . darbepoetin (ARANESP) injection 150 mcg  150 mcg Intravenous Q Fri-HD Louis Meckel, MD   150 mcg at 01/09/13 0933  . doxercalciferol (HECTOROL) injection 2 mcg  2 mcg Intravenous Q M,W,F-HD Louis Meckel, MD   2 mcg at 01/12/13 1635  . heparin injection 5,000 Units  5,000 Units Subcutaneous Q8H Olga Millers, MD   5,000 Units at 01/11/13 1519  . lactulose (CHRONULAC) 10 GM/15ML solution 30 g  30 g Oral Daily PRN Melrose Nakayama, MD   30 g at 01/13/13 [id number]  . menthol-cetylpyridinium (CEPACOL) lozenge 3 mg  1 lozenge Oral PRN Dorothy Spark, MD   3 mg at 01/10/13 0357  . metoprolol tartrate (LOPRESSOR) tablet 25 mg  25 mg Oral BID Melrose Nakayama, MD   25 mg at 01/13/13 [id number]  . multivitamin (RENA-VIT) tablet 1 tablet  1 tablet Oral QHS Olga Millers, MD   1 tablet at 01/12/13 2249  . ondansetron (ZOFRAN) injection 4 mg  4 mg Intravenous Q6H PRN Gaye Pollack, MD   4 mg at 01/08/13 1504  . ondansetron (ZOFRAN) tablet 4 mg  4 mg Oral Q6H PRN Gaye Pollack, MD   4 mg at 01/10/13 1751  . oxyCODONE-acetaminophen (PERCOCET/ROXICET) 5-325 MG per tablet 1-2 tablet  1-2 tablet Oral Q4H PRN Olga Millers, MD   2 tablet at 01/13/13 0755  . phenol (CHLORASEPTIC) mouth  spray 1 spray  1 spray Mouth/Throat PRN Dorothy Spark, MD      . polyethylene glycol [facility] / GLYCOLAX) packet 17 g  17 g Oral Daily PRN Melrose Nakayama, MD      . psyllium (HYDROCIL/METAMUCIL) packet 1 packet  1 packet Oral Daily John Giovanni, [place]   1 packet at 01/13/13 [phone]  . senna-docusate (Senokot-S) tablet 1 tablet  1 tablet Oral BID Olga Millers, MD   1 tablet at 01/13/13 [id number]     Imaging: Dg Chest Port 1 View  01/12/2013  *RADIOLOGY REPORT*  Clinical Data: The right pneumothorax,  follow-up  PORTABLE CHEST - 1 VIEW  Comparison: Portable chest x-ray of 01/11/2013  Findings: A right basilar pneumothorax remains with right chest tubes unchanged in position.  Pleural and parenchymal opacity throughout the right hemithorax is stable.  The left lung is clear. Heart size is stable.  IMPRESSION:  1.  No change in the right pneumothorax with two right chest tubes. 2.  No change in pleural and parenchymal opacity throughout the right hemithorax.   Original Report Authenticated By: Ivar Drape, M.D.     Significant Diagnostic Studies: CBC Lab Results  Component Value Date   WBC 11.0* 01/12/2013   HGB 8.1* 01/12/2013   HCT 25.6* 01/12/2013   MCV 91.1 01/12/2013   PLT 299 01/12/2013    BMET    Component Value Date/Time   NA 136 01/12/2013 1313   K 4.8 01/12/2013 1313   CL 94* 01/12/2013 1313   CO2 26 01/12/2013 1313   GLUCOSE 99 01/12/2013 1313   BUN 82* 01/12/2013 1313   CREATININE 14.83* 01/12/2013 1313   CALCIUM 10.4 01/12/2013 1313   GFRNONAA 3* 01/12/2013 1313   GFRAA 4* 01/12/2013 1313    COAG Lab Results  Component Value Date   INR 1.10 12/17/2012   No results found for this basename: PTT     Physical Examination BP Readings from Last 3 Encounters:  01/13/13 108/72  01/13/13 108/72  12/30/12 131/84   Temp Readings from Last 3 Encounters:  01/13/13 98.5 F (36.9 C) Oral  01/13/13 98.5 F (36.9 C) Oral  12/30/12 98.4 F (36.9 C) Oral   SpO2 Readings from Last 3 Encounters:   01/13/13 100%  01/13/13 100%  12/30/12 98%   Pulse Readings from Last 3 Encounters:  01/13/13 99  01/13/13 99  12/30/12 63    General:  WDWN in NAD Gait: Normal HENT: WNL Eyes: Pupils equal Pulmonary: normal non-labored breathing  Cardiac: RRR,  Abdomen: soft, NT, no masses Skin: no rashes, ulcers noted Vascular Exam/Pulses: 2+ radial pulses There is a good thrill and bruit in Right UE HD access, NT no signs of infection Extremities without ischemic changes, no Gangrene , no cellulitis; no open wounds;  Musculoskeletal: no muscle wasting or atrophy  Neurologic: A&O X 3; Appropriate Affect ;  SENSATION: normal; MOTOR FUNCTION: Pt has good and equal strength in all extremities - 5/5 Speech is fluent/normal Psych: judgment intact, mood and affect appropriate Lymph: no LAD in cervical, inguinal or axillary basins   ASSESSMENT/PLAN: [name] [name] is a 52 y.o. male  with CT findings of  chronically  calcified fibrin sheath and/or chronically calcified clot in the Right IJ from previous HD catheter  Because this area is calcified it is NOT recommended that a dilatation of this area be performed. Dr. Bridgett Larsson recommends Anticoagulation with heparin to coumadin for 3-6 months with F/U at our office and possible repeat CT of the chest  Addendum  I have independently interviewed and examined the patient, and I agree with the physician assistant's findings.  First, patient has a suspicious calcification in proximal right innominate vein suggested of calcification of a chronic fibrin sheath.  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  If the chronic fibrin sheath is obturating the outflow of the right internal jugular vein, it is possible that it might have caused the deep vein thrombosis to form due to flow stagnation.  Unfortunately, the usual therapy is balloon maceration of the fibrin sheath.  In this  case, you could not easily complete this as  usually you go through the internal jugular vein to complete the maceration.  This patient might get a pulmonary embolus from passing the balloon through the thrombosed internal jugular vein.  Also if you macerate the fibrin sheath, you might embolize the calcified segment.  I don't think you could easily snare the calcified segment from the groin, because you might break off the segment and embolize it.  At this point, I would fully anticoagulate the patient with heparin bridge to coumadin and recheck the sheath in 3-6 months to see if it has resolved any.  I do not recommend a superior vena cava filter, as there is no FDA indication for such.  The two series that have been reported demonstrate extremely high mortality rates, likely due to patient selection.  The patient can follow with me in 3 months in the office.  Adele Barthel, MD [facility] [place] Office: [phone] Pager: [phone]  01/13/2013, 12:11 PM

## 2013-01-13 NOTE — Progress Notes (Signed)
Utilization review completed.  

## 2013-01-13 NOTE — Progress Notes (Addendum)
Subjective: Dialysis yesterday Issues with air in lines - terminated early Patient unhappy with dialysis in the hospital Says he does not feel safe Have reported his concerns to current HD nurse in charge  Refused TEE  Today but is to have tomorrow  Constipated  Objective Vital signs in last 24 hours: Filed Vitals:   01/12/13 2038 01/12/13 2300 01/13/13 0328 01/13/13 0757  BP: 94/53 109/70 96/61 108/72  Pulse:  97  99  Temp: 99.4 F (37.4 C) 98.9 F (37.2 C) 98.6 F (37 C) 98.5 F (36.9 C)  TempSrc: Oral Oral Oral Oral  Resp: 20 18 17 16   Height:      Weight:      SpO2: 100% 97% 100% 100%   Weight change: 2.8 kg (6 lb 2.8 oz)  Intake/Output Summary (Last 24 hours) at 01/13/13 1103 Last data filed at 01/13/13 0800  Gross per 24 hour  Intake    440 ml  Output   2820 ml  Net  -2380 ml   Physical Exam: BP 108/72  Pulse 99  Temp 98.5 F (36.9 C) (Oral)  Resp 16  Ht 5\' 4"  (1.626 m)  Wt 94.6 kg (208 lb 8.9 oz)  BMI 35.80 kg/m2  SpO2 100% General: alert, oriented, and expressing concerns Heart: s1s2 no s3 Lungs: poor effort, grossly clear  Chest tubes (2) in place Abdomen: soft, non tender Extremities: no appreciable edema Dialysis Access: Right upper AVG good thrill and bruit No signs of infection  Labs: Basic Metabolic Panel:  Lab [id number] 1313 01/11/13 0533 01/09/13 0652  NA 136 137 137  K 4.8 4.8 4.6  CL 94* 94* 95*  CO2 26 30 28   GLUCOSE 99 93 89  BUN 82* 61* 65*  CREATININE 14.83* 11.96* 12.98*  CALCIUM 10.4 10.0 9.3  ALB -- -- --  PHOS 6.1* -- 5.1*   Liver Function Tests:  Lab 01/12/13 1313 01/09/13 0652 01/06/13 1407 01/06/13 1134  AST -- -- 13 12  ALT -- -- 11 11  ALKPHOS -- -- 70 69  BILITOT -- -- 0.5 0.5  PROT -- -- 7.2 7.1  ALBUMIN 2.1* 2.2* 2.2* --   No results found for this basename: LIPASE:3,AMYLASE:3 in the last 168 hours No results found for this basename: AMMONIA:3 in the last 168 hours CBC:  Lab 01/12/13 1313 01/11/13  0533 01/09/13 0652 01/08/13 0900 01/06/13 1407 01/06/13 1134  WBC 11.0* 12.3* 11.3* -- -- --  NEUTROABS -- -- -- -- 10.7* 10.3*  HGB 8.1* 8.1* 7.6* -- -- --  HCT 25.6* 25.7* 23.7* -- -- --  MCV 91.1 91.5 90.1 88.7 92.4 --  PLT 299 260 232 -- -- --   Cardiac Enzymes: No results found for this basename: CKTOTAL:5,CKMB:5,CKMBINDEX:5,TROPONINI:5 in the last 168 hours CBG:  Lab 01/06/13 2010  GLUCAP 100*    Iron Studies: No results found for this basename: IRON,TIBC,TRANSFERRIN,FERRITIN in the last 72 hours Studies/Results: Dg Chest Port 1 View  01/12/2013  *RADIOLOGY REPORT*  Clinical Data: The right pneumothorax, follow-up  PORTABLE CHEST - 1 VIEW  Comparison: Portable chest x-ray of 01/11/2013  Findings: A right basilar pneumothorax remains with right chest tubes unchanged in position.  Pleural and parenchymal opacity throughout the right hemithorax is stable.  The left lung is clear. Heart size is stable.  IMPRESSION:  1.  No change in the right pneumothorax with two right chest tubes. 2.  No change in pleural and parenchymal opacity throughout the right hemithorax.   Original Report  Authenticated By: Ivar Drape, M.D.    Medications: Infusions:    Scheduled Medications:    . calcium acetate  2,001 mg Oral With snacks  . calcium acetate  2,668 mg Oral TID WC  .  ceFAZolin (ANCEF) IV  2 g Intravenous Q M,W,F-2000  . cinacalcet  180 mg Oral Q2000  . darbepoetin (ARANESP) injection - DIALYSIS  150 mcg Intravenous Q Fri-HD  . doxercalciferol  2 mcg Intravenous Q M,W,F-HD  . heparin  5,000 Units Subcutaneous Q8H  . metoprolol tartrate  25 mg Oral BID  . multivitamin  1 tablet Oral QHS  . psyllium  1 packet Oral Daily  . senna-docusate  1 tablet Oral BID    have reviewed scheduled and prn medications.  Dialyzes outpt at [facility] on MWF Primary Nephrologist [place]. EDW 100 (will have new EDW), time 3.15  HD Bath 2 K, 2 Ca, Heparin no. Access Right upper arm  AVG.  Assessment/ Plan: Pt is a 52 y.o. yo male with ESRD who was admitted on 01/06/2013 with fever and bacteremia; had recent (last admission) VATS for pleural effusion with chest tubes in place   1. Fever-  with bacteremia- Staph aureus - MSSA ( data in paper chart)  AVG does not appear to be infected by exam or symptoms.  Sometimes can get a transient bacteremia in dialysis that usually responds well to IV antibiotics.  He did seem to get coverage pretty promptly but remained febrile longer than expected but now fever curve has trended down The source for infection that I am most interested in is this chronic indwelling CT, true it does not show signs of obvious infection but needs to be stongly considered the source of this fever and bacteremia.  Fever curve is improved. ID service now on board, on ancef only  For TEE 2/5  2 ESRD:  Will continue MWF schedule via AVG.    Normally runs 3 hours and 15 min that may not be adequate.  Ordered 3 and a half hours here.  HD normally at [facility].  Have conveyed his safety concerns to HD staff (nurse in charge) Will have new EDW  3 Hypertension:  Normotensive, metoprolol being held  4. Anemia of ESRD:  Will give ESA, is on moderate dosing as OP.  Will hold iv iron in the setting of bacteremia.   Transfused one unit of PRBC on 1/31 with HD    5. Metabolic Bone Disease:  Continuing  home meds of phoslo and sensipar, as well as IV hectorol with HD  6. CT-  per CTS s/p enterbronchial valve Prolonged air leak but improved Per Dr. Roxan Hockey  7.CT findings of chronically calcified fibrin sheath and/or chronically calcified clot in the right IJ from a previous HD catheter  Because this area is calcified it is NOT recommended that a dilatation of this area be performed.  Dr. Bridgett Larsson recommends anticoagulation with heparin to coumadin for 3-6 months with F/U at our office and possible repeat CT of the  chest   Amadeo Coke B   01/13/2013,11:03 AM  LOS: 7 days

## 2013-01-13 NOTE — Progress Notes (Signed)
Patient ID: [name] [name], male   DOB: 06/12/1961, 52 y.o.   MRN: [id number]    [facility]    Date of Admission:  01/06/2013           Day 8 antibiotics Principal Problem:  *Bacteremia Active Problems:  End-stage renal disease on hemodialysis  Anemia of chronic disease      . calcium acetate  2,001 mg Oral With snacks  . calcium acetate  2,668 mg Oral TID WC  .  ceFAZolin (ANCEF) IV  2 g Intravenous Q M,W,F-2000  . cinacalcet  180 mg Oral Q2000  . darbepoetin (ARANESP) injection - DIALYSIS  150 mcg Intravenous Q Fri-HD  . doxercalciferol  2 mcg Intravenous Q M,W,F-HD  . heparin  5,000 Units Subcutaneous Q8H  . metoprolol tartrate  25 mg Oral BID  . multivitamin  1 tablet Oral QHS  . psyllium  1 packet Oral Daily  . senna-docusate  1 tablet Oral BID   Objective: Temp:  [97.4 F (36.3 C)-99.5 F (37.5 C)] 98.5 F (36.9 C) (02/04 1200) Pulse Rate:  [94-111] 99  (02/04 0757) Resp:  [14-20] 16  (02/04 0757) BP: (94-120)/(52-72) 108/72 mmHg (02/04 0757) SpO2:  [94 %-100 %] 100 % (02/04 0757) Weight:  [94.6 kg (208 lb 8.9 oz)] 94.6 kg (208 lb 8.9 oz) (02/03 1557)  General: More alert Skin: No splinter or conjunctival hemorrhages Lungs: Clear anteriorly Cor: No murmur heard  Lab Results Lab Results  Component Value Date   WBC 11.0* 01/12/2013   HGB 8.1* 01/12/2013   HCT 25.6* 01/12/2013   MCV 91.1 01/12/2013   PLT 299 01/12/2013     Assessment: His last 2 sets of blood cultures are negative and his MSSA bacteremia is responding well to IV cefazolin. He is scheduled to get his transesophageal echocardiogram tomorrow.  Plan: 1. Continue cefazolin 2. TEE tomorrow  Michel Bickers, MD [facility] [phone] pager   [phone] cell 01/13/2013, 2:21 PM

## 2013-01-13 NOTE — Progress Notes (Addendum)
4 Days Post-Op Procedure(s) (LRB): VIDEO BRONCHOSCOPY WITH INSERTION OF INTERBRONCHIAL VALVE (IBV) (N/A) Subjective: C/o severe constipation No difficulty breathing  Objective: Vital signs in last 24 hours: Temp:  [97.4 F (36.3 C)-99.5 F (37.5 C)] 98.5 F (36.9 C) (02/04 0757) Pulse Rate:  [80-111] 99  (02/04 0757) Cardiac Rhythm:  [-] Sinus tachycardia (02/04 0757) Resp:  [11-20] 16  (02/04 0757) BP: (94-137)/(52-73) 108/72 mmHg (02/04 0757) SpO2:  [94 %-100 %] 100 % (02/04 0757) Weight:  [208 lb 8.9 oz (94.6 kg)-213 lb 13.5 oz (97 kg)] 208 lb 8.9 oz (94.6 kg) (02/03 1557)  Hemodynamic parameters for last 24 hours:    Intake/Output from previous day: 02/03 0701 - 02/04 0700 In: 400 [P.O.:400] Out: 2790 [Chest Tube:190] Intake/Output this shift:    General appearance: alert and no distress Heart: regular rate and rhythm Lungs: diminished breath sounds right base + air leak  Lab Results:  Basename 01/12/13 1313 01/11/13 0533  WBC 11.0* 12.3*  HGB 8.1* 8.1*  HCT 25.6* 25.7*  PLT 299 260   BMET:  Basename 01/12/13 1313 01/11/13 0533  NA 136 137  K 4.8 4.8  CL 94* 94*  CO2 26 30  GLUCOSE 99 93  BUN 82* 61*  CREATININE 14.83* 11.96*  CALCIUM 10.4 10.0    PT/INR: No results found for this basename: LABPROT,INR in the last 72 hours ABG    Component Value Date/Time   PHART 7.566* 12/19/2012 0535   HCO3 26.9* 12/19/2012 0535   TCO2 28 12/19/2012 0535   O2SAT 100.0 12/19/2012 0535   CBG (last 3)  No results found for this basename: GLUCAP:3 in the last 72 hours  Assessment/Plan: S/P Procedure(s) (LRB): VIDEO BRONCHOSCOPY WITH INSERTION OF INTERBRONCHIAL VALVE (IBV) (N/A) - Prolonged air leak- still has leak although much improved after valves, keep CT to suction today Constipation- will try lactulose +/- miralax MSSA bacteremia- refused TEE this AM- felt it wasn't explained well enough to him-. I discussed it with him in detail, he now agrees to TEE but  already ate this AM- will reschedule for tomorrow Continue ancef   LOS: 7 days    Aradhana Gin C 01/13/2013   I have asked vascular surgery to see re: whether full dose anticoagulation is indicated for IJ thrombus

## 2013-01-13 NOTE — Progress Notes (Signed)
Appreciate Dr. Lianne Moris input.  [name] [name] is very frustrated. We were apparently unable to obtain IV access earlier today and he is now refusing any additional attempts. He says he may sign out AMA to avoid having his dialysis done in the unit here.  I had a long discussion with him regarding the need for anticoagulation for the IJ thrombus. He is agreeable to starting coumadin. We will go ahead and start that tonight. We will hold off on IV heparin for now at his reuest. He is aware of the risks of thromboembolism with starting coumadin without full IV anticoagulation.

## 2013-01-13 NOTE — Progress Notes (Signed)
Pt is refusing initiation of IV accesses, lab drawn's, and sub-Q injections. Educated pt on importance of each with no change in refusal. MD made aware.

## 2013-01-13 NOTE — Progress Notes (Addendum)
ANTICOAGULATION CONSULT NOTE - Initial Consult  Pharmacy Consult for Heparin and Coumadin Indication: right IJ DVT  No Known Allergies  Patient Measurements: Height: 6\' 1"  (185.4 cm) Weight: 208 lb 8.9 oz (94.6 kg) IBW/kg (Calculated) : 79.9  Heparin Dosing Weight: 94.6 kg  Vital Signs: Temp: 99.9 F (37.7 C) (02/04 1623) Temp src: Oral (02/04 1623) BP: 107/57 mmHg (02/04 1200) Pulse Rate: 94  (02/04 1200)  Labs:  Basename 01/12/13 1313 01/11/13 0533  HGB 8.1* 8.1*  HCT 25.6* 25.7*  PLT 299 260  APTT -- --  LABPROT -- --  INR -- --  HEPARINUNFRC -- --  CREATININE 14.83* 11.96*  CKTOTAL -- --  CKMB -- --  TROPONINI -- --    Estimated Creatinine Clearance: 6.8 ml/min (by C-G formula based on Cr of 14.83).   Medical History: Past Medical History  Diagnosis Date  . End-stage renal disease on hemodialysis   . Secondary hyperparathyroidism   . Hypertension   . Polysubstance abuse   . Anemia of chronic disease     BL Hgb ~10  . Tobacco abuse    Assessment: 52 y/o male known to pharmacy from antibiotic dosing. Pharmacy consulted to begin heparin and Coumadin for right IJ DVT. Vascular is recommending anticoagulation for 3 - 6 months. No bleeding noted, H/H are low, platelets are normal.  Goal of Therapy:  INR 2-3 Heparin level 0.3-0.7 units/ml Monitor platelets by anticoagulation protocol: Yes   Plan:  -Heparin 4000 units IV bolus then infuse at 1400 units/hr -Heparin level 8 hours after started -Daily heparin level, INR and CBC -Baseline INR pending, will dose Coumadin when back  [facility], [place].D., BCPS Clinical Pharmacist Pager: [phone] 01/13/2013 4:33 PM   Addendum: Patient is refusing to obtain IV access and lab draws. Loree Fee, RN called PA on call for TCTS and ok for pharmacy protocols to be discontinued as we are unable to start or monitor anticoagulation.  Please let pharmacy know if patient changes his mind and we would be happy to  resume anticoagulation.  [place], Pharm.D., BCPS Clinical Pharmacist Pager: [phone] 01/13/2013 5:44 PM

## 2013-01-14 LAB — RENAL FUNCTION PANEL
Calcium: 10.7 mg/dL — ABNORMAL HIGH (ref 8.4–10.5)
Creatinine, Ser: 15.01 mg/dL — ABNORMAL HIGH (ref 0.50–1.35)
GFR calc Af Amer: 4 mL/min — ABNORMAL LOW (ref >90)
GFR calc non Af Amer: 3 mL/min — ABNORMAL LOW (ref >90)
Glucose, Bld: 124 mg/dL — ABNORMAL HIGH (ref 70–99)
Phosphorus: 6.8 mg/dL — ABNORMAL HIGH (ref 2.3–4.6)
Sodium: 135 mEq/L (ref 135–145)

## 2013-01-14 LAB — PROTIME-INR
INR: 1.38 (ref 0.00–1.49)
Prothrombin Time: 16.6 seconds — ABNORMAL HIGH (ref 11.6–15.2)

## 2013-01-14 LAB — CBC
HCT: 25 % — ABNORMAL LOW (ref 39.0–52.0)
Hemoglobin: 7.6 g/dL — ABNORMAL LOW (ref 13.0–17.0)
MCHC: 30.4 g/dL (ref 30.0–36.0)
RDW: 16.4 % — ABNORMAL HIGH (ref 11.5–15.5)
WBC: 12.1 10*3/uL — ABNORMAL HIGH (ref 4.0–10.5)

## 2013-01-14 MED ORDER — PENTAFLUOROPROP-TETRAFLUOROETH EX AERO
INHALATION_SPRAY | CUTANEOUS | Status: AC
  Filled 2013-01-14: qty 103.5

## 2013-01-14 MED ORDER — DOXERCALCIFEROL 4 MCG/2ML IV SOLN
INTRAVENOUS | Status: AC
  Administered 2013-01-14: 2 ug via INTRAVENOUS
  Filled 2013-01-14: qty 2

## 2013-01-14 NOTE — Progress Notes (Signed)
TEE scheduled for 1530hrs this Friday.  [name] [name] 4:03 PM

## 2013-01-14 NOTE — Progress Notes (Signed)
5 Days Post-Op Procedure(s) (LRB): VIDEO BRONCHOSCOPY WITH INSERTION OF INTERBRONCHIAL VALVE (IBV) (N/A) Subjective: Still discouraged, but a little better outlook this AM  Objective: Vital signs in last 24 hours: Temp:  [98.5 F (36.9 C)-99.9 F (37.7 C)] 99 F (37.2 C) (02/05 0421) Pulse Rate:  [78-94] 78  (02/05 0421) Cardiac Rhythm:  [-] Normal sinus rhythm (02/05 0421) Resp:  [12-18] 18  (02/05 0421) BP: (103-112)/(55-65) 112/59 mmHg (02/05 0421) SpO2:  [94 %-100 %] 94 % (02/05 0421) Weight:  [206 lb 2.1 oz (93.5 kg)] 206 lb 2.1 oz (93.5 kg) (02/05 0500)  Hemodynamic parameters for last 24 hours:    Intake/Output from previous day: 02/04 0701 - 02/05 0700 In: 1080 [P.O.:1080] Out: 246 [Urine:20; Emesis/NG output:1; Chest Tube:225] Intake/Output this shift:    General appearance: alert and no distress Lungs: diminished breath sounds RLL small air leak  Lab Results:  Basename 01/12/13 1313  WBC 11.0*  HGB 8.1*  HCT 25.6*  PLT 299   BMET:  Basename 01/12/13 1313  NA 136  K 4.8  CL 94*  CO2 26  GLUCOSE 99  BUN 82*  CREATININE 14.83*  CALCIUM 10.4    PT/INR:  Basename 01/14/13 0420  LABPROT 16.6*  INR 1.38   ABG    Component Value Date/Time   PHART 7.566* 12/19/2012 0535   HCO3 26.9* 12/19/2012 0535   TCO2 28 12/19/2012 0535   O2SAT 100.0 12/19/2012 0535   CBG (last 3)  No results found for this basename: GLUCAP:3 in the last 72 hours  Assessment/Plan: S/P Procedure(s) (LRB): VIDEO BRONCHOSCOPY WITH INSERTION OF INTERBRONCHIAL VALVE (IBV) (N/A) - Fever- MSSA bacteremia- continue ancef on HD  For TEE today to r/o endocarditis Air leak- persists, but is much smaller than it had been prior to valves  Keep CT to suction today  Check CXR and consider water seal tomorrow ESRD- HD today   LOS: 8 days    HENDRICKSON,STEVEN C 01/14/2013

## 2013-01-14 NOTE — Progress Notes (Signed)
Patient ID: [name] [name], male   DOB: [birth year], 52 y.o.   MRN: [id number]    [facility]    Date of Admission:  01/06/2013   Total days of antibiotics 9         Principal Problem:  *Bacteremia Active Problems:  End-stage renal disease on hemodialysis  Anemia of chronic disease      . calcium acetate  2,001 mg Oral With snacks  . calcium acetate  2,668 mg Oral TID WC  .  ceFAZolin (ANCEF) IV  2 g Intravenous Q M,W,F-2000  . cinacalcet  180 mg Oral Q2000  . coumadin book   Does not apply Once  . darbepoetin (ARANESP) injection - DIALYSIS  150 mcg Intravenous Q Fri-HD  . doxercalciferol  2 mcg Intravenous Q M,W,F-HD  . metoprolol tartrate  25 mg Oral BID  . multivitamin  1 tablet Oral QHS  . psyllium  1 packet Oral Daily  . senna-docusate  1 tablet Oral BID  . warfarin  5 mg Oral q1800  . warfarin   Does not apply Once  . Warfarin - Physician Dosing Inpatient   Does not apply q1800    Subjective: He is feeling much better.  Objective: Temp:  [98.3 F (36.8 C)-99.9 F (37.7 C)] 98.3 F (36.8 C) (02/05 1100) Pulse Rate:  [78-92] 78  (02/05 0421) Resp:  [12-18] 18  (02/05 0700) BP: (103-115)/(55-65) 115/59 mmHg (02/05 0700) SpO2:  [94 %-100 %] 94 % (02/05 0700) Weight:  [93.5 kg (206 lb 2.1 oz)] 93.5 kg (206 lb 2.1 oz) (02/05 0500)  General: He is alert and comfortable sitting up in a chair Skin: No rash, splinter or conjunctival hemorrhages. His right upper arm hemodialysis graft appears normal Lungs: Clear anteriorly Cor: Regular S1 and S2 no murmurs  Lab Results Lab Results  Component Value Date   WBC 11.0* 01/12/2013   HGB 8.1* 01/12/2013   HCT 25.6* 01/12/2013   MCV 91.1 01/12/2013   PLT 299 01/12/2013    Lab Results  Component Value Date   CREATININE 14.83* 01/12/2013   BUN 82* 01/12/2013   NA 136 01/12/2013   K 4.8 01/12/2013   CL 94* 01/12/2013   CO2 26 01/12/2013    Lab Results  Component Value Date   ALT 11 01/06/2013   AST 13 01/06/2013     ALKPHOS 70 01/06/2013   BILITOT 0.5 01/06/2013      Microbiology: Recent Results (from the past 240 hour(s))  CULTURE, BLOOD (ROUTINE X 2)     Status: Normal   Collection Time   01/06/13 11:34 AM      Component Value Range Status Comment   Specimen Description BLOOD ARM LEFT   Final    Special Requests BOTTLES DRAWN AEROBIC AND ANAEROBIC 10CC   Final    Culture  Setup Time 01/06/2013 18:01   Final    Culture NO GROWTH 5 DAYS   Final    Report Status 01/12/2013 FINAL   Final   CULTURE, BLOOD (ROUTINE X 2)     Status: Normal   Collection Time   01/06/13 11:45 AM      Component Value Range Status Comment   Specimen Description BLOOD ARM LEFT   Final    Special Requests BOTTLES DRAWN AEROBIC AND ANAEROBIC 10CC   Final    Culture  Setup Time 01/06/2013 18:01   Final    Culture NO GROWTH 5 DAYS   Final    Report  Status 01/12/2013 FINAL   Final   MRSA PCR SCREENING     Status: Normal   Collection Time   01/07/13 12:18 AM      Component Value Range Status Comment   MRSA by PCR NEGATIVE  NEGATIVE Final   CULTURE, BLOOD (SINGLE)     Status: Normal   Collection Time   01/07/13  2:40 AM      Component Value Range Status Comment   Specimen Description BLOOD LEFT HAND   Final    Special Requests BOTTLES DRAWN AEROBIC ONLY [facility]   Final    Culture  Setup Time 01/07/2013 09:19   Final    Culture NO GROWTH 5 DAYS   Final    Report Status 01/13/2013 FINAL   Final   CULTURE, RESPIRATORY     Status: Normal   Collection Time   01/09/13 12:45 PM      Component Value Range Status Comment   Specimen Description BRONCHIAL WASHINGS   Final    Special Requests PT ON VANCOMYCIN   Final    Gram Stain     Final    Value: RARE WBC PRESENT, PREDOMINANTLY MONONUCLEAR     NO ORGANISMS SEEN   Culture NO GROWTH 2 DAYS   Final    Report Status 01/12/2013 FINAL   Final     Studies/Results: Dg Chest Port 1 View  01/13/2013  *RADIOLOGY REPORT*  Clinical Data: Evaluate chest tube  PORTABLE CHEST - 1 VIEW   Comparison: Portable chest x-ray of 01/12/2039  Findings: The right basilar pneumothorax noted previously is not definitely seen.  There is a small amount of pleural air overlying the right lung apex and a right chest tube remains.  Right apical pleural thickening is stable as is volume loss throughout the right hemithorax.  The left lung is clear.  IMPRESSION: A small amount of pleural air remains in the right lung apex with a right chest tube remaining.  No definite basilar pneumothorax is currently seen.   Original Report Authenticated By: Ivar Drape, M.D.     Assessment: He is responding well to antibiotic therapy for MSSA bacteremia. It sounds like his TEE could not be done today and will need to be rescheduled.  Plan: 1. Continue Ancef 2. Reschedule TEE  Michel Bickers, MD [facility] [phone] pager   [phone] cell 01/14/2013, 12:01 PM

## 2013-01-14 NOTE — Progress Notes (Signed)
Subjective: Reported his concerns about HD yesterday and nurse from HD came and spoke with him today He feels better and outlook on things a LOT better For HD today  Objective Vital signs in last 24 hours: Filed Vitals:   01/14/13 0000 01/14/13 0421 01/14/13 0500 01/14/13 0700  BP: 112/65 112/59  115/59  Pulse: 84 78    Temp: 99.1 F (37.3 C) 99 F (37.2 C)  98.4 F (36.9 C)  TempSrc: Oral Oral  Oral  Resp: 17 18  18   Height:      Weight:   93.5 kg (206 lb 2.1 oz)   SpO2: 95% 94%  94%   Weight change: -3.5 kg (-7 lb 11.5 oz)  Intake/Output Summary (Last 24 hours) at 01/14/13 1037 Last data filed at 01/14/13 0800  Gross per 24 hour  Intake    840 ml  Output    271 ml  Net    569 ml   Physical Exam: BP 115/59  Pulse 78  Temp 98.4 F (36.9 C) (Oral)  Resp 18  Ht 6\' 1"  (1.854 m)  Wt 93.5 kg (206 lb 2.1 oz)  BMI 27.20 kg/m2  SpO2 94% General: alert, oriented,  Heart: s1s2 no s3 Lungs: poor effort, grossly clear  Chest tube  in place Abdomen: soft, non tender Extremities: no appreciable edema Dialysis Access: Right upper AVG good thrill and bruit No signs of infection  Labs: Basic Metabolic Panel:  Lab [id number] 1313 01/11/13 0533 01/09/13 0652  NA 136 137 137  K 4.8 4.8 4.6  CL 94* 94* 95*  CO2 26 30 28   GLUCOSE 99 93 89  BUN 82* 61* 65*  CREATININE 14.83* 11.96* 12.98*  CALCIUM 10.4 10.0 9.3  ALB -- -- --  PHOS 6.1* -- 5.1*   Liver Function Tests:  Lab 01/12/13 1313 01/09/13 0652  AST -- --  ALT -- --  ALKPHOS -- --  BILITOT -- --  PROT -- --  ALBUMIN 2.1* 2.2*   No results found for this basename: LIPASE:3,AMYLASE:3 in the last 168 hours No results found for this basename: AMMONIA:3 in the last 168 hours CBC:  Lab 01/12/13 1313 01/11/13 0533 01/09/13 0652 01/08/13 0900  WBC 11.0* 12.3* 11.3* --  NEUTROABS -- -- -- --  HGB 8.1* 8.1* 7.6* --  HCT 25.6* 25.7* 23.7* --  MCV 91.1 91.5 90.1 88.7  PLT 299 260 232 --   Cardiac Enzymes: No  results found for this basename: CKTOTAL:5,CKMB:5,CKMBINDEX:5,TROPONINI:5 in the last 168 hours CBG: No results found for this basename: GLUCAP:5 in the last 168 hours  Iron Studies: No results found for this basename: IRON,TIBC,TRANSFERRIN,FERRITIN in the last 72 hours Studies/Results: Dg Chest Port 1 View  01/13/2013  *RADIOLOGY REPORT*  Clinical Data: Evaluate chest tube  PORTABLE CHEST - 1 VIEW  Comparison: Portable chest x-ray of 01/12/2039  Findings: The right basilar pneumothorax noted previously is not definitely seen.  There is a small amount of pleural air overlying the right lung apex and a right chest tube remains.  Right apical pleural thickening is stable as is volume loss throughout the right hemithorax.  The left lung is clear.  IMPRESSION: A small amount of pleural air remains in the right lung apex with a right chest tube remaining.  No definite basilar pneumothorax is currently seen.   Original Report Authenticated By: Ivar Drape, M.D.    Medications: Infusions:    Scheduled Medications:    . calcium acetate  2,001 mg Oral With  snacks  . calcium acetate  2,668 mg Oral TID WC  .  ceFAZolin (ANCEF) IV  2 g Intravenous Q M,W,F-2000  . cinacalcet  180 mg Oral Q2000  . coumadin book   Does not apply Once  . darbepoetin (ARANESP) injection - DIALYSIS  150 mcg Intravenous Q Fri-HD  . doxercalciferol  2 mcg Intravenous Q M,W,F-HD  . metoprolol tartrate  25 mg Oral BID  . multivitamin  1 tablet Oral QHS  . psyllium  1 packet Oral Daily  . senna-docusate  1 tablet Oral BID  . warfarin  5 mg Oral q1800  . warfarin   Does not apply Once  . Warfarin - Physician Dosing Inpatient   Does not apply q1800    have reviewed scheduled and prn medications.  Dialyzes outpt at [place] on MWF Primary Nephrologist [place]. EDW 100 (will have new EDW), time 3.15  HD Bath 2 K, 2 Ca, Heparin none. Access Right upper arm AVG.  Assessment/ Plan: Pt is a 52 y.o. yo male  with ESRD who was admitted on 01/06/2013 with fever and bacteremia; had recent (last admission) VATS for pleural effusion with chest tubes in place   1. Fever- resolved Associated with bacteremia- Staph aureus - MSSA ( data in paper chart)  AVG does not appear to be infected by exam or symptoms.  Sometimes can get a transient bacteremia in dialysis that usually responds well to IV antibiotics.  Fever curve is improved. ID service now on board, on ancef only  For TEE   2 ESRD:  Will continue MWF schedule via AVG.    Normally runs 3 hours and 15 min that may not be adequate.  Ordered 3 and a half hours here.  HD normally at [place].  Have conveyed his safety concerns to HD staff (nurse in charge) Will have new EDW  3 Hypertension:  Normotensive, metoprolol being held  4. Anemia of ESRD:  Will give ESA, is on moderate dosing as OP.  Will hold iv iron in the setting of bacteremia.   Transfused one unit of PRBC on 1/31 with HD    5. Metabolic Bone Disease:  Continuing  home meds of phoslo and sensipar, as well as IV hectorol with HD  6. CT-  per CTS s/p enterbronchial valve Prolonged air leak but improved To continue suction, consider water seal 2/6 Per Dr. Roxan Hockey  7.CT findings of chronically calcified fibrin sheath and/or chronically calcified clot in the right IJ from a previous HD catheter  Because this area is calcified it is NOT recommended that a dilatation of this area be performed.  Dr. Bridgett Larsson recommends anticoagulation with heparin to coumadin for 3-6 months with F/U at our office and possible repeat CT of the chest Pharmacy dosing  Kael Keetch B   01/14/2013,10:37 AM  LOS: 8 days

## 2013-01-14 NOTE — Progress Notes (Signed)
Nutrition Brief Note  Patient identified on the Malnutrition Screening Tool (MST) Report for unsure of any recent weight loss.    Wt Readings from Last 10 Encounters:  01/14/13 206 lb 2.1 oz (93.5 kg)  01/14/13 206 lb 2.1 oz (93.5 kg)  12/30/12 220 lb 0.3 oz (99.8 kg)  12/30/12 220 lb 0.3 oz (99.8 kg)  12/30/12 220 lb 0.3 oz (99.8 kg)  12/30/12 220 lb 0.3 oz (99.8 kg)    Patient has lost some weight related to fluid status.     Body mass index is 27.20 kg/(m^2). Patient meets criteria for overweight based on current BMI.   Current diet order is Renal, patient is consuming approximately 75-100% of meals at this time. Labs and medications reviewed.   No nutrition interventions warranted at this time. If nutrition issues arise, please consult RD.   Molli Barrows, RD, LDN, [place] Pager# [phone] After Hours Pager# [phone]

## 2013-01-14 NOTE — Consult Note (Signed)
[facility]       CONSULTATION NOTE   Reason for Consult: Endocarditis  Requesting Physician: Dr. Roxan Hockey  Cardiologist: None  HPI: This is a 52 y.o. male with a past medical history significant for ESRD on HD, right-sided pleural effusion, and fevers with blood cultures that are growing GM+ cocci in clusters per [place] medical records (the other culture is no growth to date after 18-24 hours). Patient was started empirically on vancomycin and cefepime in the ED after blood cultures were drawn. The patient is on MWF hemodialysis schedule in [place], and had to discontinue his HD session 2 hours early yesterday secondary to fever/chills. On a previous admission for GI bleeding on 12/11/2012, he was noted to have a right pleural effusion for which he underwent VATS and chest tube placement on 12/19/2012 (previously underwent thoracentesis for R pleural effusion ~18 months ago at [facility]).  He was evaluated by Dr. Roxan Hockey and felt not to have any infection around the chest tube sites, however, there was a large air leak.  He underwent bronchoscopy with insertion of 2 intrabronchial valves in the right lower lobe. A BAL was performed. Patient has been seen by the Infectious Disease service who recommended continuing vancomycin and ancef.  A TEE is requested to evaluate for endocarditis. Apparently this was planned 2 days ago, but he refused, saying that he did not fully comprehend the procedure - I do not see any cardiology consultation regarding this.   PMHx:  Past Medical History  Diagnosis Date  . End-stage renal disease on hemodialysis   . Secondary hyperparathyroidism   . Hypertension   . Polysubstance abuse   . Anemia of chronic disease     BL Hgb ~10  . Tobacco abuse    Past Surgical History  Procedure Date  . Surgery for horseshoe kidney   . Colonoscopy 12/16/2012    Procedure: COLONOSCOPY;  Surgeon: Milus Banister, MD;  Location: [place];  Service: Endoscopy;  Laterality: N/A;  . Video assisted thoracoscopy 12/18/2012    Procedure: VIDEO ASSISTED THORACOSCOPY;  Surgeon: Melrose Nakayama, MD;  Location: [place];  Service: Thoracic;  Laterality: Right;  pleural peel  . Pleural effusion drainage 12/18/2012    Procedure: DRAINAGE OF PLEURAL EFFUSION;  Surgeon: Melrose Nakayama, MD;  Location: [place];  Service: Thoracic;  Laterality: Right;  . Video bronchoscopy with insertion of interbronchial valve (ibv) 01/09/2013    Procedure: VIDEO BRONCHOSCOPY WITH INSERTION OF INTERBRONCHIAL VALVE (IBV);  Surgeon: Melrose Nakayama, MD;  Location: [place];  Service: Thoracic;  Laterality: N/A;  placement of 2 Intrabronchial valves in right lower lobe    FAMHx: Family History  Problem Relation Age of Onset  . Thyroid disease Mother   . Renal Disease      SOCHx:  reports that he has been smoking Cigarettes.  He has a 10 pack-year smoking history. He does not have any smokeless tobacco history on file. He reports that he drinks alcohol. He reports that he uses illicit drugs.  ALLERGIES: No Known Allergies  ROS: A comprehensive review of systems was negative except for: Constitutional: positive for anorexia, chills, fatigue and malaise Cardiovascular: positive for dyspnea Genitourinary: positive for ESRD on HD Hematologic/lymphatic: positive for easy bruising Behavioral/Psych: positive for mood swings  HOME MEDICATIONS: Prescriptions prior to admission  Medication Sig Dispense Refill  . calcium acetate (PHOSLO) 667 MG capsule Take 2,668 mg by mouth 3 (three) times daily with  meals.       . calcium acetate (PHOSLO) 667 MG capsule Take 2,001 mg by mouth with snacks.      . cinacalcet (SENSIPAR) 90 MG tablet Take 180 mg by mouth daily.      . darbepoetin (ARANESP) 100 MCG/0.5ML SOLN Inject 100 mcg into the vein every Friday with hemodialysis.      Marland Kitchen doxercalciferol (HECTOROL) 4 MCG/2ML injection Inject 4.5 mcg into the vein  every Monday, Wednesday, and Friday with hemodialysis.      Marland Kitchen metoprolol tartrate (LOPRESSOR) 25 MG tablet Take 25 mg by mouth 2 (two) times daily.      . multivitamin (RENA-VIT) TABS tablet Take 1 tablet by mouth at bedtime.      Marland Kitchen oxyCODONE-acetaminophen (PERCOCET/ROXICET) 5-325 MG per tablet Take 1-2 tablets by mouth every 4 (four) hours as needed. For pain        HOSPITAL MEDICATIONS: Prior to Admission:  Prescriptions prior to admission  Medication Sig Dispense Refill  . calcium acetate (PHOSLO) 667 MG capsule Take 2,668 mg by mouth 3 (three) times daily with meals.       . calcium acetate (PHOSLO) 667 MG capsule Take 2,001 mg by mouth with snacks.      . cinacalcet (SENSIPAR) 90 MG tablet Take 180 mg by mouth daily.      . darbepoetin (ARANESP) 100 MCG/0.5ML SOLN Inject 100 mcg into the vein every Friday with hemodialysis.      Marland Kitchen doxercalciferol (HECTOROL) 4 MCG/2ML injection Inject 4.5 mcg into the vein every Monday, Wednesday, and Friday with hemodialysis.      Marland Kitchen metoprolol tartrate (LOPRESSOR) 25 MG tablet Take 25 mg by mouth 2 (two) times daily.      . multivitamin (RENA-VIT) TABS tablet Take 1 tablet by mouth at bedtime.      Marland Kitchen oxyCODONE-acetaminophen (PERCOCET/ROXICET) 5-325 MG per tablet Take 1-2 tablets by mouth every 4 (four) hours as needed. For pain        VITALS: Blood pressure 117/60, pulse 85, temperature 98.7 F (37.1 C), temperature source Oral, resp. rate 22, height 6\' 1"  (1.854 m), weight 96 kg (211 lb 10.3 oz), SpO2 98.00%.  PHYSICAL EXAM: General appearance: alert and distracted Neck: no adenopathy, no carotid bruit, supple, symmetrical, trachea midline and thyroid not enlarged, symmetric, no tenderness/mass/nodules Lungs: clear to auscultation bilaterally Heart: regular rate and rhythm, S1, S2 normal, no murmur, click, rub or gallop Abdomen: soft, non-tender; bowel sounds normal; no masses,  no organomegaly Extremities: extremities normal, atraumatic, no  cyanosis or edema and right arm dialysis fistula Pulses: 2+ and symmetric Skin: Skin color, texture, turgor normal. No rashes or lesions Neurologic: Mental status: somewhat somnolent, but follows commands, able to answer questions, oriented  LABS: Results for orders placed during the hospital encounter of 01/06/13 (from the past 48 hour(s))  PROTIME-INR     Status: Abnormal   Collection Time   01/13/13  4:35 PM      Component Value Range Comment   Prothrombin Time 15.8 (*) 11.6 - 15.2 seconds    INR 1.29  0.00 - 1.49   PROTIME-INR     Status: Abnormal   Collection Time   01/14/13  4:20 AM      Component Value Range Comment   Prothrombin Time 16.6 (*) 11.6 - 15.2 seconds    INR 1.38  0.00 - 1.49     IMAGING: Dg Chest Port 1 View  01/13/2013  *RADIOLOGY REPORT*  Clinical Data: Evaluate chest tube  PORTABLE  CHEST - 1 VIEW  Comparison: Portable chest x-ray of 01/12/2039  Findings: The right basilar pneumothorax noted previously is not definitely seen.  There is a small amount of pleural air overlying the right lung apex and a right chest tube remains.  Right apical pleural thickening is stable as is volume loss throughout the right hemithorax.  The left lung is clear.  IMPRESSION: A small amount of pleural air remains in the right lung apex with a right chest tube remaining.  No definite basilar pneumothorax is currently seen.   Original Report Authenticated By: Ivar Drape, M.D.    Echo (01/08/13):  *[place] [place], [place] [address] [phone]  ------------------------------------------------------------ Transthoracic Echocardiography  Patient: Montique, Betzen MR #: [id number] Study Date: 01/08/2013 Gender: M Age: 42 Height: Weight: 96.5kg BSA: 2.87m^2 Pt. Status: Room: [facility], Ec SONOGRAPHER Diamond Nickel ORDERING Paya, Glenna Fellows cc:  ------------------------------------------------------------ LV EF: 50% - 55%  ------------------------------------------------------------ Indications: Bacteremia 790.7.  ------------------------------------------------------------ History: PMH: End stage renal disease. Chronic anemia. Risk factors: Hypertension.  ------------------------------------------------------------ Study Conclusions  - Procedure narrative: Transthoracic echocardiography. Image quality was suboptimal. The study was technically difficult, as a result of poor sound wave transmission. - Left ventricle: Wall thickness was increased in a pattern of mild LVH. Systolic function was normal. The estimated ejection fraction was in the range of 50% to 55%. Wall motion was normal; there were no regional wall motion abnormalities. The study is not technically sufficient to allow evaluation of LV diastolic function. - Pericardium, extracardiac: A trivial pericardial effusion was identified. Impressions:  - Vegetation cannot be excluded due to poor image quality. Recommendations: Consider transesophageal echocardiography if clinically indicated in order to exclude intracardiac thrombus.   IMPRESSION: 1. MSSA bacteremia, ?endocarditis 2. ESRD on HD with a fistula 3. Poor quality transthoracic echocardiogram  RECOMMENDATION: 1. Mr. Bone has MSSA bacteremia and seems to be responding to antibiotics, which have been narrowed down to Ancef. I agree that we need to evaluate for endocarditis as it will effect course of treatment. I reviewed his transthoracic echo which provides little information - exam does not reveal a significant murmur or other sequelae of endocarditis. I discussed TEE in detail with him at dialysis today, including risks and possible complications. He seems to understand the necessity of this and is agreeable.  Unfortunately, we could not arrange until this Friday. Dr. Sallyanne Kuster will be  performing the procedure.  Please keep NPO p MN on Thursday.  Thanks for consulting [place].  Time Spent Directly with Patient: 30 minutes  Pixie Casino, MD, [facility] Attending Cardiologist [facility] 01/14/2013, 3:47 PM

## 2013-01-15 ENCOUNTER — Inpatient Hospital Stay (HOSPITAL_COMMUNITY): Payer: Medicare Other

## 2013-01-15 MED ORDER — TALC 5 G PL SUSR
3.0000 g | Freq: Once | INTRAVENOUS | Status: DC
  Filled 2013-01-15: qty 3

## 2013-01-15 MED ORDER — TALC 5 G PL SUSR: 3.0000 g | Freq: Once | INTRAVENOUS | Status: DC

## 2013-01-15 MED ORDER — SODIUM CHLORIDE 0.9 % IV SOLN
INTRAVENOUS | Status: DC
  Administered 2013-01-16: 15:00:00 via INTRAVENOUS

## 2013-01-15 NOTE — Progress Notes (Signed)
ANTIBIOTIC CONSULT NOTE - FOLLOW UP  Pharmacy Consult for Cefazolin Indication: MSSA bacteremia  No Known Allergies  Patient Measurements: Height: 6\' 1"  (185.4 cm) Weight: 207 lb 0.2 oz (93.9 kg) IBW/kg (Calculated) : 79.9  Adjusted Body Weight:   Vital Signs: Temp: 98 F (36.7 C) (02/06 1122) Temp src: Oral (02/06 1122) BP: 116/61 mmHg (02/06 1122) Pulse Rate: 89  (02/06 1122) Intake/Output from previous day: 02/05 0701 - 02/06 0700 In: 480 [P.O.:480] Out: 1135 [Chest Tube:240] Intake/Output from this shift:    Labs:  Basename 01/14/13 1527  WBC 12.1*  HGB 7.6*  PLT 342  LABCREA --  CREATININE 15.01*   Estimated Creatinine Clearance: 6.7 ml/min (by C-G formula based on Cr of 15.01). No results found for this basename: VANCOTROUGH:2,VANCOPEAK:2,VANCORANDOM:2,GENTTROUGH:2,GENTPEAK:2,GENTRANDOM:2,TOBRATROUGH:2,TOBRAPEAK:2,TOBRARND:2,AMIKACINPEAK:2,AMIKACINTROU:2,AMIKACIN:2, in the last 72 hours   Microbiology: Recent Results (from the past 720 hour(s))  BODY FLUID CULTURE     Status: Normal   Collection Time   12/18/12  3:31 PM      Component Value Range Status Comment   Specimen Description PLEURAL FLUID RIGHT   Final    Special Requests PATIENT ON FOLLOWING VANCOMYCIN   Final    Gram Stain     Final    Value: NO WBC SEEN     NO ORGANISMS SEEN   Culture NO GROWTH 3 DAYS   Final    Report Status 12/21/2012 FINAL   Final   AFB CULTURE WITH SMEAR     Status: Normal (Preliminary result)   Collection Time   12/18/12  3:31 PM      Component Value Range Status Comment   Specimen Description PLEURAL FLUID RIGHT   Final    Special Requests PATIENT ON FOLLOWING VANCOMYCIN   Final    ACID FAST SMEAR NO ACID FAST BACILLI SEEN   Final    Culture     Final    Value: CULTURE WILL BE EXAMINED FOR 6 WEEKS BEFORE ISSUING A FINAL REPORT   Report Status PENDING   Incomplete   FUNGUS CULTURE W SMEAR     Status: Normal (Preliminary result)   Collection Time   12/18/12  3:31 PM   Component Value Range Status Comment   Specimen Description PLEURAL FLUID RIGHT   Final    Special Requests PATIENT ON FOLLOWING VANCOMYCIN   Final    Fungal Smear NO YEAST OR FUNGAL ELEMENTS SEEN   Final    Culture CULTURE IN PROGRESS FOR FOUR WEEKS   Final    Report Status PENDING   Incomplete   CULTURE, BLOOD (ROUTINE X 2)     Status: Normal   Collection Time   01/06/13 11:34 AM      Component Value Range Status Comment   Specimen Description BLOOD ARM LEFT   Final    Special Requests BOTTLES DRAWN AEROBIC AND ANAEROBIC 10CC   Final    Culture  Setup Time 01/06/2013 18:01   Final    Culture NO GROWTH 5 DAYS   Final    Report Status 01/12/2013 FINAL   Final   CULTURE, BLOOD (ROUTINE X 2)     Status: Normal   Collection Time   01/06/13 11:45 AM      Component Value Range Status Comment   Specimen Description BLOOD ARM LEFT   Final    Special Requests BOTTLES DRAWN AEROBIC AND ANAEROBIC 10CC   Final    Culture  Setup Time 01/06/2013 18:01   Final    Culture NO GROWTH 5  DAYS   Final    Report Status 01/12/2013 FINAL   Final   MRSA PCR SCREENING     Status: Normal   Collection Time   01/07/13 12:18 AM      Component Value Range Status Comment   MRSA by PCR NEGATIVE  NEGATIVE Final   CULTURE, BLOOD (SINGLE)     Status: Normal   Collection Time   01/07/13  2:40 AM      Component Value Range Status Comment   Specimen Description BLOOD LEFT HAND   Final    Special Requests BOTTLES DRAWN AEROBIC ONLY [facility]   Final    Culture  Setup Time 01/07/2013 09:19   Final    Culture NO GROWTH 5 DAYS   Final    Report Status 01/13/2013 FINAL   Final   CULTURE, RESPIRATORY     Status: Normal   Collection Time   01/09/13 12:45 PM      Component Value Range Status Comment   Specimen Description BRONCHIAL WASHINGS   Final    Special Requests PT ON VANCOMYCIN   Final    Gram Stain     Final    Value: RARE WBC PRESENT, PREDOMINANTLY MONONUCLEAR     NO ORGANISMS SEEN   Culture NO GROWTH 2 DAYS   Final     Report Status 01/12/2013 FINAL   Final     Anti-infectives     Start     Dose/Rate Route Frequency Ordered Stop   01/07/13 2000   ceFAZolin (ANCEF) IVPB 2 g/50 mL premix        2 g 100 mL/hr over 30 Minutes Intravenous Every M-W-F (2000) 01/07/13 1837     01/07/13 1500   ceFEPIme (MAXIPIME) 500 mg in dextrose 5 % 50 mL IVPB  Status:  Discontinued        500 mg 100 mL/hr over 30 Minutes Intravenous Every 24 hours 01/06/13 1909 01/07/13 0815   01/07/13 1200   vancomycin (VANCOCIN) IVPB 1000 mg/200 mL premix  Status:  Discontinued        1,000 mg 200 mL/hr over 60 Minutes Intravenous Every M-W-F (Hemodialysis) 01/06/13 1909 01/09/13 1250   01/07/13 0900   piperacillin-tazobactam (ZOSYN) IVPB 2.25 g  Status:  Discontinued        2.25 g 100 mL/hr over 30 Minutes Intravenous 3 times per day 01/07/13 0827 01/07/13 1754   01/06/13 1915   vancomycin (VANCOCIN) IVPB 1000 mg/200 mL premix        1,000 mg 200 mL/hr over 60 Minutes Intravenous  Once 01/06/13 1909 01/06/13 2019   01/06/13 1330   ceFEPIme (MAXIPIME) 2 g in dextrose 5 % 50 mL IVPB        2 g 100 mL/hr over 30 Minutes Intravenous  Once 01/06/13 1320 01/06/13 1520   01/06/13 1300   vancomycin (VANCOCIN) IVPB 1000 mg/200 mL premix        1,000 mg 200 mL/hr over 60 Minutes Intravenous  Once 01/06/13 1258 01/06/13 1426          Assessment: 52yo male with MSSA bacteremia per OSH records.  Blood cultures drawn here on 1/28 & 1/29 are negative-final.  Today is day # 9 of Cefazolin, adjusted for ESRD.  No levels are necessary.  Will continue to follow with you.  Goal of Therapy:  Resolution of infection  Plan:  Continue Ancef 2gm IV MWF with HD  Gracy Bruins, PharmD Clinical Pharmacist [facility]

## 2013-01-15 NOTE — Progress Notes (Signed)
Subjective: Says his chest just does not feel right Not SOB Eating bfast Had HD yesterday without incident  Objective Vital signs in last 24 hours: Filed Vitals:   01/15/13 0017 01/15/13 0415 01/15/13 0457 01/15/13 0718  BP: 112/62 103/57  110/67  Pulse: 89 88  83  Temp: 99.3 F (37.4 C) 100 F (37.8 C)  98.2 F (36.8 C)  TempSrc: Oral Oral  Oral  Resp: 12 22 14 11   Height:      Weight:      SpO2: 96% 97%  98%   Weight change: 2.5 kg (5 lb 8.2 oz)  Intake/Output Summary (Last 24 hours) at 01/15/13 0908 Last data filed at 01/15/13 [id number]  Gross per 24 hour  Intake    360 ml  Output   1060 ml  Net   -700 ml   01/14/13 1824 93.9 kg (207 lb 0.2 oz) Post HD 01/14/13 1500 96 kg (211 lb 10.3 oz)    Pre HD 01/14/13 0500 93.5 kg (206 lb 2.1 oz)   01/12/13 1557 94.6 kg (208 lb 8.9 oz) 01/12/13 1226 97 kg (213 lb   Physical Exam: BP 110/67  Pulse 83  Temp 98.2 F (36.8 C) (Oral)  Resp 11  Ht 6\' 1"  (1.854 m)  Wt 93.9 kg (207 lb 0.2 oz)  BMI 27.31 kg/m2  SpO2 98% General: alert, oriented,  Heart: s1s2 no s3 Lungs: poor effort, grossly clear left Right post insp sqeak/rhoncous Chest tube  in place Abdomen: soft, non tender Extremities: no appreciable edema Dialysis Access: Right upper AVG good thrill and bruit No signs of infection  Labs: None today Basic Metabolic Panel:  Lab [id number] 1527 01/12/13 1313 01/11/13 0533 01/09/13 0652  NA 135 136 137 --  K 4.7 4.8 4.8 --  CL 92* 94* 94* --  CO2 27 26 30  --  GLUCOSE 124* 99 93 --  BUN 73* 82* 61* --  CREATININE 15.01* 14.83* 11.96* --  CALCIUM 10.7* 10.4 10.0 --  ALB -- -- -- --  PHOS 6.8* 6.1* -- 5.1*   Liver Function Tests:  Lab 01/14/13 1527 01/12/13 1313 01/09/13 0652  AST -- -- --  ALT -- -- --  ALKPHOS -- -- --  BILITOT -- -- --  PROT -- -- --  ALBUMIN 2.1* 2.1* 2.2*   No results found for this basename: LIPASE:3,AMYLASE:3 in the last 168 hours No results found for this basename: AMMONIA:3 in the  last 168 hours CBC:  Lab 01/14/13 1527 01/12/13 1313 01/11/13 0533 01/09/13 0652  WBC 12.1* 11.0* 12.3* --  NEUTROABS -- -- -- --  HGB 7.6* 8.1* 8.1* --  HCT 25.0* 25.6* 25.7* --  MCV 90.9 91.1 91.5 90.1  PLT 342 299 260 --   Cardiac Enzymes: No results found for this basename: CKTOTAL:5,CKMB:5,CKMBINDEX:5,TROPONINI:5 in the last 168 hours CBG: No results found for this basename: GLUCAP:5 in the last 168 hours  Iron Studies: No results found for this basename: IRON,TIBC,TRANSFERRIN,FERRITIN in the last 72 hours Studies/Results: Dg Chest Port 1 View  01/15/2013  *RADIOLOGY REPORT*  Clinical Data: Follow up pneumothorax  PORTABLE CHEST - 1 VIEW  Comparison: 01/13/2013  Findings: The right-sided chest tube remains in satisfactory position.  The degree of aeration in the right lung is stable.  No definitive pneumothorax is seen.  Left lung remains clear.  Cardiac shadow is stable.  IMPRESSION: Stable changes on the right.  No pneumothorax is seen.   Original Report Authenticated By: Inez Catalina, M.D.  Medications: Infusions:    Scheduled Medications:    . calcium acetate  2,001 mg Oral With snacks  . calcium acetate  2,668 mg Oral TID WC  .  ceFAZolin (ANCEF) IV  2 g Intravenous Q M,W,F-2000  . cinacalcet  180 mg Oral Q2000  . coumadin book   Does not apply Once  . darbepoetin (ARANESP) injection - DIALYSIS  150 mcg Intravenous Q Fri-HD  . doxercalciferol  2 mcg Intravenous Q M,W,F-HD  . metoprolol tartrate  25 mg Oral BID  . multivitamin  1 tablet Oral QHS  . psyllium  1 packet Oral Daily  . senna-docusate  1 tablet Oral BID  . warfarin  5 mg Oral q1800  . Warfarin - Physician Dosing Inpatient   Does not apply q1800    have reviewed scheduled and prn medications.  Dialyzes outpt at [place] on MWF Primary Nephrologist [place]. EDW 100 (will have new EDW), time 3.15  HD Bath 2 K, 2 Ca, Heparin none. Access Right upper arm AVG.  Assessment/ Plan: Pt is  a 52 y.o. yo male with ESRD who was admitted on 01/06/2013 with fever and bacteremia; had recent (last admission) VATS for pleural effusion with chest tubes in place   1. Fever- resolved Associated with bacteremia- Staph aureus - MSSA ( data in paper chart)  AVG does not appear to be infected by exam or symptoms.  Sometimes can get a transient bacteremia in dialysis that usually responds well to IV antibiotics.  Fever curve is improved. ID service now on board, on ancef only  For TEE on 2/7  2 ESRD:  Will continue MWF schedule via AVG.    Normally runs 3 hours and 15 min that may not be adequate.  Ordering 3 and a half hours here.  HD normally at [place].  Have conveyed his safety concerns to HD staff (nurse in charge) Will have new EDW of about 94 kg-94.5 kg  3 Hypertension:  Normotensive Metoprolol being held Controlled with volume removal  4. Anemia of ESRD:  Will give ESA, is on moderate dosing as OP.  Will hold iv iron in the setting of bacteremia.   Transfused one unit of PRBC on 1/31 with HD    5. Metabolic Bone Disease:  Continuing  home meds of phoslo and sensipar IV hectorol with HD  6. CT-  per CTS s/p enterbronchial valve Prolonged air leak but improved Per Dr. Roxan Hockey  7.CT findings of chronically calcified fibrin sheath and/or chronically calcified clot in the right IJ from a previous HD catheter  Because this area is calcified it is NOT recommended that a dilatation of this area be performed.  Dr. Bridgett Larsson recommends anticoagulation with heparin to coumadin for 3-6 months with F/U at their office and possible repeat CT of the chest Pharmacy dosing  Kimmi Acocella B   01/15/2013,9:08 AM  LOS: 9 days

## 2013-01-15 NOTE — Progress Notes (Addendum)
6 Days Post-Op Procedure(s) (LRB): VIDEO BRONCHOSCOPY WITH INSERTION OF INTERBRONCHIAL VALVE (IBV) (N/A) Subjective: [name] [name] states he feels better.  He does wonder when he will get his chest tube out.  Objective: Vital signs in last 24 hours: Temp:  [98.2 F (36.8 C)-100 F (37.8 C)] 98.2 F (36.8 C) (02/06 0718) Pulse Rate:  [83-109] 83  (02/06 0718) Cardiac Rhythm:  [-] Normal sinus rhythm (02/06 0415) Resp:  [11-23] 11  (02/06 0718) BP: (103-131)/(54-70) 110/67 mmHg (02/06 0718) SpO2:  [96 %-99 %] 98 % (02/06 0718) Weight:  [207 lb 0.2 oz (93.9 kg)-211 lb 10.3 oz (96 kg)] 207 lb 0.2 oz (93.9 kg) (02/05 1824) Intake/Output from previous day: 02/05 0701 - 02/06 0700 In: 480 [P.O.:480] Out: 1135 [Chest Tube:240]  General appearance: alert, cooperative and no distress Heart: regular rate and rhythm Lungs: diminished breath sounds RLL Abdomen: soft, non-tender; bowel sounds normal; no masses,  no organomegaly Wound: clean and dry  Lab Results:  Basename 01/14/13 1527 01/12/13 1313  WBC 12.1* 11.0*  HGB 7.6* 8.1*  HCT 25.0* 25.6*  PLT 342 299   BMET:  Basename 01/14/13 1527 01/12/13 1313  NA 135 136  K 4.7 4.8  CL 92* 94*  CO2 27 26  GLUCOSE 124* 99  BUN 73* 82*  CREATININE 15.01* 14.83*  CALCIUM 10.7* 10.4    PT/INR:  Basename 01/15/13 0445  LABPROT 19.5*  INR 1.71*   ABG    Component Value Date/Time   PHART 7.566* 12/19/2012 0535   HCO3 26.9* 12/19/2012 0535   TCO2 28 12/19/2012 0535   O2SAT 100.0 12/19/2012 0535   CBG (last 3)  No results found for this basename: GLUCAP:3 in the last 72 hours  Assessment/Plan: S/P Procedure(s) (LRB): VIDEO BRONCHOSCOPY WITH INSERTION OF INTERBRONCHIAL VALVE (IBV) (N/A)  1. Chest tube- continued air leak with cough, leave tube on suction 2. ID- +MSSA Bactermia on IV ABX, following ID recommendations 3. TEE tomorrow to r/o Endocarditis 4. ESRD- Nephrology following   LOS: 9 days    [name],  [name] 01/15/2013   He's in much better spirits today.  His air leak is essentially unchanged from yesterday  I discussed with him a trial of talc pleurodesis to try to eliminate the rest of the air leak- it has about a 90% success rate according to the literature. He understands there is no guarantee with talc, but I think there's a good chance it may work at this point

## 2013-01-16 ENCOUNTER — Encounter (HOSPITAL_COMMUNITY): Payer: Self-pay | Admitting: *Deleted

## 2013-01-16 ENCOUNTER — Inpatient Hospital Stay (HOSPITAL_COMMUNITY): Payer: Medicare Other

## 2013-01-16 ENCOUNTER — Encounter (HOSPITAL_COMMUNITY)
Admission: EM | Disposition: A | Discharge status: Home / Self Care | Payer: Self-pay | Source: Home / Self Care | Attending: Thoracic Surgery (Cardiothoracic Vascular Surgery)

## 2013-01-16 DIAGNOSIS — R7881 Bacteremia: Secondary | ICD-10-CM

## 2013-01-16 HISTORY — PX: TEE WITHOUT CARDIOVERSION: SHX5443

## 2013-01-16 LAB — CBC
MCV: 92.6 fL (ref 78.0–100.0)
Platelets: 358 10*3/uL (ref 150–400)
RBC: 2.7 MIL/uL — ABNORMAL LOW (ref 4.22–5.81)
RDW: 16.4 % — ABNORMAL HIGH (ref 11.5–15.5)
WBC: 15.3 10*3/uL — ABNORMAL HIGH (ref 4.0–10.5)

## 2013-01-16 LAB — PROTIME-INR: Prothrombin Time: 22 seconds — ABNORMAL HIGH (ref 11.6–15.2)

## 2013-01-16 LAB — FUNGUS CULTURE W SMEAR

## 2013-01-16 LAB — RENAL FUNCTION PANEL
Albumin: 2.3 g/dL — ABNORMAL LOW (ref 3.5–5.2)
BUN: 53 mg/dL — ABNORMAL HIGH (ref 6–23)
Chloride: 95 mEq/L — ABNORMAL LOW (ref 96–112)
GFR calc Af Amer: 5 mL/min — ABNORMAL LOW (ref >90)
Potassium: 4.9 mEq/L (ref 3.5–5.1)
Sodium: 137 mEq/L (ref 135–145)

## 2013-01-16 SURGERY — ECHOCARDIOGRAM, TRANSESOPHAGEAL
Anesthesia: Moderate Sedation

## 2013-01-16 MED ORDER — FENTANYL CITRATE 0.05 MG/ML IJ SOLN
INTRAMUSCULAR | Status: DC | PRN
  Administered 2013-01-16 (×3): 25 ug via INTRAVENOUS

## 2013-01-16 MED ORDER — MIDAZOLAM HCL 5 MG/ML IJ SOLN
INTRAMUSCULAR | Status: AC
  Filled 2013-01-16: qty 2

## 2013-01-16 MED ORDER — FENTANYL CITRATE 0.05 MG/ML IJ SOLN
INTRAMUSCULAR | Status: AC
  Filled 2013-01-16: qty 2

## 2013-01-16 MED ORDER — BUTAMBEN-TETRACAINE-BENZOCAINE 2-2-14 % EX AERO
INHALATION_SPRAY | CUTANEOUS | Status: DC | PRN
  Administered 2013-01-16: 2 via TOPICAL

## 2013-01-16 MED ORDER — MIDAZOLAM HCL 10 MG/2ML IJ SOLN
INTRAMUSCULAR | Status: DC | PRN
  Administered 2013-01-16 (×2): 1 mg via INTRAVENOUS
  Administered 2013-01-16: 2 mg via INTRAVENOUS
  Administered 2013-01-16: 1 mg via INTRAVENOUS

## 2013-01-16 MED ORDER — DARBEPOETIN ALFA-POLYSORBATE 150 MCG/0.3ML IJ SOLN
INTRAMUSCULAR | Status: AC
  Administered 2013-01-16: 150 ug via INTRAVENOUS
  Filled 2013-01-16: qty 0.3

## 2013-01-16 MED ORDER — DOXERCALCIFEROL 4 MCG/2ML IV SOLN
INTRAVENOUS | Status: AC
  Filled 2013-01-16: qty 2

## 2013-01-16 NOTE — CV Procedure (Signed)
[name],[name] F Male, 52 y.o., 10/30/1961  Location: [facility]  Bed: NONE  MRN: [id number]  CSN: [id number]  INDICATIONS: infective endocarditis  PROCEDURE:   Informed consent was obtained prior to the procedure. The risks, benefits and alternatives for the procedure were discussed and the patient comprehended these risks.  Risks include, but are not limited to, cough, sore throat, vomiting, nausea, somnolence, esophageal and stomach trauma or perforation, bleeding, low blood pressure, aspiration, pneumonia, infection, trauma to the teeth and death.    After a procedural time-out, the oropharynx was anesthetized with 20% benzocaine spray. The patient was given 5 mg versed and 75 mcg fentanyl for moderate sedation.   The transesophageal probe was inserted in the esophagus and stomach without difficulty and multiple views were obtained.  The patient was kept under observation until the patient left the procedure room.  The patient left the procedure room in stable condition.   Agitated saline contrast was not administered.  COMPLICATIONS:    There were no immediate complications.  FINDINGS:  No evidence of endocarditis. Moderate LVH. Normal LV systolic function. Mild degenerative aortic valve changes. Normal thoracic aorta with very mild atherosclerosis.  RECOMMENDATIONS:   No evidence of endocarditis  Time Spent Directly with the Patient:  30 minutes   Ever Gustafson 01/16/2013, 3:40 PM

## 2013-01-16 NOTE — Procedures (Signed)
I have personally attended this patient's dialysis session.   Pre weight 96.1 Post weight pending  2K 2 calcium bath, goal was 2 liters.   Patient signing off 20 minutes early with c/o being cold.   No issues with cannulation of his graft. BFR 400. BP 100's with occas dips to 90's.  No heparin (getting coumadin) Labs with K 4.9, calcium 10.3, phos 6.9 - has crept up in the hospital despite (presumably) getting his binders.  Hb stable but low at 7.8. On darbe 150 Qfriday  Had talc pleurodesis yesterday For TEE today (b/o MSSA bacteremia) Remains on ANCEF      [name] [name] B

## 2013-01-16 NOTE — Progress Notes (Addendum)
7 Days Post-Op Procedure(s) (LRB): VIDEO BRONCHOSCOPY WITH INSERTION OF INTERBRONCHIAL VALVE (IBV) (N/A) Subjective:  [name] [name] is in Dialysis this morning.  No new complaints   Objective: Vital signs in last 24 hours: Temp:  [98 F (36.7 C)-99.6 F (37.6 C)] 98.2 F (36.8 C) (02/07 0750) Pulse Rate:  [75-89] 78  (02/07 0830) Cardiac Rhythm:  [-] Normal sinus rhythm (02/07 0750) Resp:  [0-22] 14  (02/07 0830) BP: (103-127)/(58-70) 103/58 mmHg (02/07 0830) SpO2:  [95 %-99 %] 95 % (02/07 0750) Weight:  [205 lb 14.6 oz (93.4 kg)-211 lb 13.8 oz (96.1 kg)] 211 lb 13.8 oz (96.1 kg) (02/07 0750)    General appearance: no distress Heart: regular rate and rhythm Lungs: diminished breath sounds RLL Abdomen: soft, non-tender; bowel sounds normal; no masses,  no organomegaly Wound: clean and dry  Lab Results:  Basename 01/16/13 0525 01/14/13 1527  WBC 15.3* 12.1*  HGB 7.8* 7.6*  HCT 25.0* 25.0*  PLT 358 342   BMET:  Basename 01/16/13 0525 01/14/13 1527  NA 137 135  K 4.9 4.7  CL 95* 92*  CO2 28 27  GLUCOSE 95 124*  BUN 53* 73*  CREATININE 11.90* 15.01*  CALCIUM 10.3 10.7*    PT/INR:  Basename 01/16/13 0525  LABPROT 22.0*  INR 2.01*   ABG    Component Value Date/Time   PHART 7.566* 12/19/2012 0535   HCO3 26.9* 12/19/2012 0535   TCO2 28 12/19/2012 0535   O2SAT 100.0 12/19/2012 0535   CBG (last 3)  No results found for this basename: GLUCAP:3 in the last 72 hours  Assessment/Plan: S/P Procedure(s) (LRB): VIDEO BRONCHOSCOPY WITH INSERTION OF INTERBRONCHIAL VALVE (IBV) (N/A)  1. Chest tube- S/P Talc pleurodesis yesterday- currently off suction in HD, + air leak with cough, CXR with new right basilar pneumothorax 2. ID- + MSSA Bacteremia- ID following, continue ABX 3. TEE today 4. ESRD- HD today 5. Continue current care   LOS: 10 days    [name] [name] 01/16/2013   Patient seen and examined. He's very tired after getting back from HD He had only a tiny air leak  with the first couple of coughs which resolved when tube put on suction- will keep on suction today. For TEE later today WBC up today- probably related to talc

## 2013-01-16 NOTE — Progress Notes (Signed)
  Echocardiogram 2D Echocardiogram has been performed.  [name] [name] 01/16/2013, 5:07 PM

## 2013-01-16 NOTE — Progress Notes (Signed)
To HD at 730.

## 2013-01-16 NOTE — Progress Notes (Signed)
Patient ID: [name] [name], male   DOB: [birth year], 52 y.o.   MRN: [id number]    [facility] for Infectious Disease    Date of Admission:  01/06/2013   Total days of antibiotics 10         Principal Problem:  *Bacteremia Active Problems:  End-stage renal disease on hemodialysis  Anemia of chronic disease  Subjective: He feels very weak and cold after returning from hemodialysis  Objective: Temp:  [97.8 F (36.6 C)-99.6 F (37.6 C)] 97.8 F (36.6 C) (02/07 1145) Pulse Rate:  [75-92] 92  (02/07 1145) Resp:  [0-22] 20  (02/07 1145) BP: (87-130)/(53-77) 114/62 mmHg (02/07 1145) SpO2:  [95 %-99 %] 95 % (02/07 1145) Weight:  [93.4 kg (205 lb 14.6 oz)-96.1 kg (211 lb 13.8 oz)] 94.2 kg (207 lb 10.8 oz) (02/07 1139)  General: Sitting on side in bed in no distress Skin: No rash Lungs: Clear Cor: Regular S1 and S2 with no murmur   Lab Results Lab Results  Component Value Date   WBC 15.3* 01/16/2013   HGB 7.8* 01/16/2013   HCT 25.0* 01/16/2013   MCV 92.6 01/16/2013   PLT 358 01/16/2013     Assessment: His MSSA bacteremia is improving.   Plan: 1. Continue cefazolin 2. TEE later today 3. Please call Dr. Lita Mains [phone]) for any infectious disease questions this weekend  [name] Bickers, MD [facility] for [place] [facility] [phone] pager   [phone] cell 01/16/2013, 1:04 PM

## 2013-01-16 NOTE — Progress Notes (Signed)
Subjective: "cold"  Objective: Vital signs in last 24 hours: Temp:  [98 F (36.7 C)-99.6 F (37.6 C)] 98.2 F (36.8 C) (02/07 0750) Pulse Rate:  [75-89] 81  (02/07 1030) Resp:  [0-22] 17  (02/07 1030) BP: (87-129)/(53-70) 129/64 mmHg (02/07 1030) SpO2:  [95 %-99 %] 95 % (02/07 0750) Weight:  [93.4 kg (205 lb 14.6 oz)-96.1 kg (211 lb 13.8 oz)] 96.1 kg (211 lb 13.8 oz) (02/07 0750) Weight change: -2.6 kg (-5 lb 11.7 oz) Last BM Date: 01/15/13 Intake/Output from previous day:   Intake/Output this shift:    PE: Heart:S1S2 RRR Lungs:clear    Lab Results:  Basename 01/16/13 0525 01/14/13 1527  WBC 15.3* 12.1*  HGB 7.8* 7.6*  HCT 25.0* 25.0*  PLT 358 342   BMET  Basename 01/16/13 0525 01/14/13 1527  NA 137 135  K 4.9 4.7  CL 95* 92*  CO2 28 27  GLUCOSE 95 124*  BUN 53* 73*  CREATININE 11.90* 15.01*  CALCIUM 10.3 10.7*     Hepatic Function Panel  Basename 01/16/13 0525  PROT --  ALBUMIN 2.3*  AST --  ALT --  ALKPHOS --  BILITOT --  BILIDIR --  IBILI --    Studies/Results: Dg Chest Port 1 View  01/16/2013  *RADIOLOGY REPORT*  Clinical Data: Chest tube  PORTABLE CHEST - 1 VIEW  Comparison: Portable exam 0608 hours compared to 01/15/2013  Findings: Right thoracostomy tube again identified with tip near apex. Enlargement of cardiac silhouette. Stable mediastinal contours. Atherosclerotic calcification aorta. Persistent diffuse infiltrate right lung. Persistent right pleural effusion with increased right basilar pneumothorax. Left lung remains clear.  IMPRESSION: Right hydropneumothorax with increase in right basilar pneumothorax component since previous exam. Enlargement of cardiac silhouette.   Original Report Authenticated By: Lavonia Dana, M.D.    Dg Chest Port 1 View  01/15/2013  *RADIOLOGY REPORT*  Clinical Data: Follow up pneumothorax  PORTABLE CHEST - 1 VIEW  Comparison: 01/13/2013  Findings: The right-sided chest tube remains in satisfactory position.  The  degree of aeration in the right lung is stable.  No definitive pneumothorax is seen.  Left lung remains clear.  Cardiac shadow is stable.  IMPRESSION: Stable changes on the right.  No pneumothorax is seen.   Original Report Authenticated By: Inez Catalina, M.D.     Medications: I have reviewed the patient's current medications.    . calcium acetate  2,001 mg Oral With snacks  . calcium acetate  2,668 mg Oral TID WC  .  ceFAZolin (ANCEF) IV  2 g Intravenous Q M,W,F-2000  . cinacalcet  180 mg Oral Q2000  . coumadin book   Does not apply Once  . darbepoetin (ARANESP) injection - DIALYSIS  150 mcg Intravenous Q Fri-HD  . doxercalciferol  2 mcg Intravenous Q M,W,F-HD  . metoprolol tartrate  25 mg Oral BID  . multivitamin  1 tablet Oral QHS  . psyllium  1 packet Oral Daily  . senna-docusate  1 tablet Oral BID  . talc slurry in NS +/- lidocaine  3 g Intrapleural Once  . warfarin  5 mg Oral q1800  . Warfarin - Physician Dosing Inpatient   Does not apply q1800   Assessment/Plan: Principal Problem:  *Bacteremia Active Problems:  End-stage renal disease on hemodialysis  Anemia of chronic disease  PLAN: for TEE today.  OK for BK then NPO.    LOS: 10 days   INGOLD,LAURA R 01/16/2013, 10:53 AM   I have seen and examined the patient  along with [facility] R, NP.  I have reviewed the chart, notes and new data.  I agree with PA/NP's note.  Needs TEE for possible endocarditis.This procedure has been fully reviewed with the patient and written informed consent has been obtained.   Sanda Klein, MD, [place] [phone] 01/16/2013, 11:20 AM

## 2013-01-16 NOTE — Progress Notes (Signed)
Utilization review completed.  

## 2013-01-17 ENCOUNTER — Encounter (HOSPITAL_COMMUNITY): Payer: Self-pay | Admitting: Nephrology

## 2013-01-17 ENCOUNTER — Inpatient Hospital Stay (HOSPITAL_COMMUNITY): Payer: Medicare Other

## 2013-01-17 DIAGNOSIS — I82C19 Acute embolism and thrombosis of unspecified internal jugular vein: Secondary | ICD-10-CM | POA: Diagnosis present

## 2013-01-17 LAB — PROTIME-INR: Prothrombin Time: 23.9 seconds — ABNORMAL HIGH (ref 11.6–15.2)

## 2013-01-17 NOTE — Progress Notes (Signed)
Subjective: Dialysis yesterday No cannulation issues or customer service issues but signed off 20 minutes early because he was cold Feels good this AM Wife in today  Chest tube still with a small air leak Pt says "maybe more talc on Monday?"  Objective Vital signs in last 24 hours: Filed Vitals:   01/17/13 0348 01/17/13 0500 01/17/13 0700 01/17/13 0755  BP: 146/70   118/64  Pulse:    89  Temp: 99 F (37.2 C)  98.5 F (36.9 C)   TempSrc: Oral  Oral   Resp: 16   16  Height:      Weight:  93.5 kg (206 lb 2.1 oz)    SpO2: 98%   96%   Weight change: 2.7 kg (5 lb 15.2 oz)  Intake/Output Summary (Last 24 hours) at 01/17/13 1027 Last data filed at 01/17/13 0800  Gross per 24 hour  Intake    360 ml  Output   1373 ml  Net  -1013 ml   Physical Exam: BP 118/64  Pulse 89  Temp(Src) 98.5 F (36.9 C) (Oral)  Resp 16  Ht 6\' 1"  (1.854 m)  Wt 93.5 kg (206 lb 2.1 oz)  BMI 27.2 kg/m2  SpO2 96% General: alert, oriented,  Heart: s1s2 no s3 Lungs:Left side clear Right side better air movement Chest tube  in place Abdomen: soft, non tender Extremities: no appreciable edema Dialysis Access: Right upper AVG good thrill and bruit  Labs: Basic Metabolic Panel:  Recent Labs Lab 01/12/13 1313 01/14/13 1527 01/16/13 0525  NA 136 135 137  K 4.8 4.7 4.9  CL 94* 92* 95*  CO2 26 27 28   GLUCOSE 99 124* 95  BUN 82* 73* 53*  CREATININE 14.83* 15.01* 11.90*  CALCIUM 10.4 10.7* 10.3  PHOS 6.1* 6.8* 6.9*   Liver Function Tests:  Recent Labs Lab 01/12/13 1313 01/14/13 1527 01/16/13 0525  ALBUMIN 2.1* 2.1* 2.3*   No results found for this basename: LIPASE, AMYLASE,  in the last 168 hours No results found for this basename: AMMONIA,  in the last 168 hours CBC:  Recent Labs Lab 01/11/13 0533 01/12/13 1313 01/14/13 1527 01/16/13 0525  WBC 12.3* 11.0* 12.1* 15.3*  HGB 8.1* 8.1* 7.6* 7.8*  HCT 25.7* 25.6* 25.0* 25.0*  MCV 91.5 91.1 90.9 92.6  PLT 260 299 342 358   Cardiac  Enzymes: No results found for this basename: CKTOTAL, CKMB, CKMBINDEX, TROPONINI,  in the last 168 hours CBG: No results found for this basename: GLUCAP,  in the last 168 hours  Iron Studies: No results found for this basename: IRON, TIBC, TRANSFERRIN, FERRITIN,  in the last 72 hours Studies/Results: Dg Chest Port 1 View  01/17/2013  *RADIOLOGY REPORT*  Clinical Data: Chest tube, pneumothorax  PORTABLE CHEST - 1 VIEW  Comparison: Prior chest x-ray 01/16/2013  Findings: Stable position of right-sided thoracostomy tube. Persistent right pleural effusion with decreased pneumothorax component.  Only trace residual lucency at the right lung base. There is persistent dense streaky opacity throughout the right lung.  The left lung remains well aerated.  Stable enlargement of the cardiopericardial silhouette.  IMPRESSION:  1.  Decreasing pneumothorax component of the right hydropneumothorax.  The amount of pleural fluid is similar to slightly increased. 2.  Persistent streaky opacities throughout the right lung likely reflecting scarring versus atelectasis. 3.  Stable position of right thoracostomy tube.   Original Report Authenticated By: Jacqulynn Cadet, M.D.    Dg Chest Port 1 View  01/16/2013  *RADIOLOGY REPORT*  Clinical Data: Chest tube  PORTABLE CHEST - 1 VIEW  Comparison: Portable exam 0608 hours compared to 01/15/2013  Findings: Right thoracostomy tube again identified with tip near apex. Enlargement of cardiac silhouette. Stable mediastinal contours. Atherosclerotic calcification aorta. Persistent diffuse infiltrate right lung. Persistent right pleural effusion with increased right basilar pneumothorax. Left lung remains clear.  IMPRESSION: Right hydropneumothorax with increase in right basilar pneumothorax component since previous exam. Enlargement of cardiac silhouette.   Original Report Authenticated By: Lavonia Dana, M.D.    Medications: Infusions:    Scheduled Medications: . calcium acetate   2,001 mg Oral With snacks  . calcium acetate  2,668 mg Oral TID WC  .  ceFAZolin (ANCEF) IV  2 g Intravenous Q M,W,F-2000  . cinacalcet  180 mg Oral Q2000  . coumadin book   Does not apply Once  . darbepoetin (ARANESP) injection - DIALYSIS  150 mcg Intravenous Q Fri-HD  . doxercalciferol  2 mcg Intravenous Q M,W,F-HD  . metoprolol tartrate  25 mg Oral BID  . multivitamin  1 tablet Oral QHS  . psyllium  1 packet Oral Daily  . senna-docusate  1 tablet Oral BID  . warfarin  5 mg Oral q1800  . Warfarin - Physician Dosing Inpatient   Does not apply q1800    have reviewed scheduled and prn medications.  Outpatient dialysis:  [facility] on MWF Primary Nephrologist [facility].  EDW 100 (will have new EDW at discharge), time 3.15  HD Bath 2 K, 2 Ca, Heparin none. Access Right upper arm AVG.  Weights: 01/17/13 0500 93.5 kg 01/16/13 1139 94.2 kg  Post HD 01/16/13 0750 96.1 kg  Pre HD  Assessment/ Plan:  Pt is a 52 y.o. yo male with ESRD who was admitted on 01/06/2013 with fever and MSSA bacteremia; had recent (last admission) VATS for pleural effusion with chest tubes in place   1. MSSA bacteremia ( data in paper chart)  On ancef Source uncertain TEE negative ID following ? Duration of rx  2 ESRD:  Continue MWF schedule via AVG.    Normally runs 3 hours and 15 min HD normally at [facility].  Will have new EDW at discharge (93.5-94)  3 Hypertension:  Meds/volume control with HD  4. Anemia of ESRD:  Darbe 150/week  Transfused one unit of PRBC on 1/31 with HD    5. Metabolic Bone Disease:  Continuing  home meds of phoslo and sensipar, as well as IV hectorol with HD  6. CT- s/p VATS - persistent air leak per CTS s/p enterbronchial valve Prolonged air leak but improved To continue suction, consider water seal 2/6 Per Dr. 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  Dr. Bridgett Larsson recommends anticoagulation with heparin to coumadin for 3-6 months with F/U at their office and possible repeat CT of the chest Pharmacy dosing  Maurio Baize B   01/17/2013,10:27 AM  LOS: 11 days

## 2013-01-17 NOTE — Progress Notes (Addendum)
1 Day Post-Op Procedure(s) (LRB): TRANSESOPHAGEAL ECHOCARDIOGRAM (TEE) (N/A) Subjective:  Mr. Bemiss is resting comfortably this morning.  No new complaints, wife at bedside  Objective: Vital signs in last 24 hours: Temp:  [97.7 F (36.5 C)-99.5 F (37.5 C)] 98.5 F (36.9 C) (02/08 0700) Pulse Rate:  [81-107] 89 (02/08 0755) Cardiac Rhythm:  [-] Normal sinus rhythm (02/08 0348) Resp:  [10-73] 16 (02/08 0755) BP: (87-146)/(53-81) 118/64 mmHg (02/08 0755) SpO2:  [94 %-100 %] 96 % (02/08 0755) Weight:  [206 lb 2.1 oz (93.5 kg)-207 lb 10.8 oz (94.2 kg)] 206 lb 2.1 oz (93.5 kg) (02/08 0500)  Intake/Output from previous day: 02/07 0701 - 02/08 0700 In: 360 [P.O.:360] Out: 1323 [Chest Tube:50] Intake/Output this shift: Total I/O In: -  Out: 50 [Chest Tube:50]  General appearance: alert, cooperative and no distress Heart: regular rate and rhythm Lungs: diminished breath sounds RLL Abdomen: soft, non-tender; bowel sounds normal; no masses,  no organomegaly Wound: clean and dry  Lab Results:  Recent Labs  01/14/13 1527 01/16/13 0525  WBC 12.1* 15.3*  HGB 7.6* 7.8*  HCT 25.0* 25.0*  PLT 342 358   BMET:  Recent Labs  01/14/13 1527 01/16/13 0525  NA 135 137  K 4.7 4.9  CL 92* 95*  CO2 27 28  GLUCOSE 124* 95  BUN 73* 53*  CREATININE 15.01* 11.90*  CALCIUM 10.7* 10.3    PT/INR:  Recent Labs  01/17/13 0450  LABPROT 23.9*  INR 2.25*   ABG    Component Value Date/Time   PHART 7.566* 12/19/2012 0535   HCO3 26.9* 12/19/2012 0535   TCO2 28 12/19/2012 0535   O2SAT 100.0 12/19/2012 0535   CBG (last 3)  No results found for this basename: GLUCAP,  in the last 72 hours  Assessment/Plan: S/P Procedure(s) (LRB): TRANSESOPHAGEAL ECHOCARDIOGRAM (TEE) (N/A)  1. Chest tube- small air leak with cough, Improvement of pneumothorax on the right, leave on suction today 2. ID- MSSA Bacteremia, ABX per ID recommendations 3. TEE- ruled out evidence of endocarditis 4. ESRD-  dialysis MWF, nephrology following 5. Dispo- repeat CXR in AM, continue current care   LOS: 11 days    BARRETT, ERIN 01/17/2013  Patient seen and examined Agree with above He still has a small residual air leak Lung is well expanded on CXR- keep CT on suction today

## 2013-01-18 ENCOUNTER — Inpatient Hospital Stay (HOSPITAL_COMMUNITY): Payer: Medicare Other

## 2013-01-18 NOTE — Progress Notes (Signed)
ANTIBIOTIC CONSULT NOTE - FOLLOW UP  Pharmacy Consult for Cefazolin Indication: MSSA bacteremia  No Known Allergies  Patient Measurements: Height: 6\' 1"  (185.4 cm) Weight: 206 lb 9.1 oz (93.7 kg) IBW/kg (Calculated) : 79.9 Adjusted Body Weight:   Vital Signs: Temp: 98.2 F (36.8 C) (02/09 0700) Temp src: Oral (02/09 0700) BP: 115/69 mmHg (02/09 0815) Pulse Rate: 80 (02/09 0815) Intake/Output from previous day: 02/08 0701 - 02/09 0700 In: 480 [P.O.:480] Out: 340 [Chest Tube:340] Intake/Output from this shift: Total I/O In: -  Out: 90 [Chest Tube:90]  Labs:  Recent Labs  01/16/13 0525  WBC 15.3*  HGB 7.8*  PLT 358  CREATININE 11.90*   Estimated Creatinine Clearance: 8.5 ml/min (by C-G formula based on Cr of 11.9). No results found for this basename: VANCOTROUGH, Corlis Leak, VANCORANDOM, [place], GENTPEAK, [place], [place], TOBRAPEAK, TOBRARND, AMIKACINPEAK, AMIKACINTROU, AMIKACIN,  in the last 72 hours   Microbiology: Recent Results (from the past 720 hour(s))  CULTURE, BLOOD (ROUTINE X 2)     Status: None   Collection Time    01/06/13 11:34 AM      Result Value Range Status   Specimen Description BLOOD ARM LEFT   Final   Special Requests BOTTLES DRAWN AEROBIC AND ANAEROBIC 10CC   Final   Culture  Setup Time 01/06/2013 18:01   Final   Culture NO GROWTH 5 DAYS   Final   Report Status 01/12/2013 FINAL   Final  CULTURE, BLOOD (ROUTINE X 2)     Status: None   Collection Time    01/06/13 11:45 AM      Result Value Range Status   Specimen Description BLOOD ARM LEFT   Final   Special Requests BOTTLES DRAWN AEROBIC AND ANAEROBIC 10CC   Final   Culture  Setup Time 01/06/2013 18:01   Final   Culture NO GROWTH 5 DAYS   Final   Report Status 01/12/2013 FINAL   Final  MRSA PCR SCREENING     Status: None   Collection Time    01/07/13 12:18 AM      Result Value Range Status   MRSA by PCR NEGATIVE  NEGATIVE Final   Comment:            The GeneXpert MRSA Assay  (FDA     approved for NASAL specimens     only), is one component of a     comprehensive MRSA colonization     surveillance program. It is not     intended to diagnose MRSA     infection nor to guide or     monitor treatment for     MRSA infections.  CULTURE, BLOOD (SINGLE)     Status: None   Collection Time    01/07/13  2:40 AM      Result Value Range Status   Specimen Description BLOOD LEFT HAND   Final   Special Requests BOTTLES DRAWN AEROBIC ONLY [facility]   Final   Culture  Setup Time 01/07/2013 09:19   Final   Culture NO GROWTH 5 DAYS   Final   Report Status 01/13/2013 FINAL   Final  CULTURE, RESPIRATORY     Status: None   Collection Time    01/09/13 12:45 PM      Result Value Range Status   Specimen Description BRONCHIAL WASHINGS   Final   Special Requests PT ON VANCOMYCIN   Final   Gram Stain     Final   Value: RARE WBC PRESENT, PREDOMINANTLY MONONUCLEAR  NO ORGANISMS SEEN   Culture NO GROWTH 2 DAYS   Final   Report Status 01/12/2013 FINAL   Final    Anti-infectives   Start     Dose/Rate Route Frequency Ordered Stop   01/07/13 2000  ceFAZolin (ANCEF) IVPB 2 g/50 mL premix     2 g 100 mL/hr over 30 Minutes Intravenous Every M-W-F (2000) 01/07/13 1837     01/07/13 1500  ceFEPIme (MAXIPIME) 500 mg in dextrose 5 % 50 mL IVPB  Status:  Discontinued     500 mg 100 mL/hr over 30 Minutes Intravenous Every 24 hours 01/06/13 1909 01/07/13 0815   01/07/13 1200  [MAR Hold]  vancomycin (VANCOCIN) IVPB 1000 mg/200 mL premix  Status:  Discontinued     (On MAR Hold since 01/09/13 1101)   1,000 mg 200 mL/hr over 60 Minutes Intravenous Every M-W-F (Hemodialysis) 01/06/13 1909 01/09/13 1250   01/07/13 0900  piperacillin-tazobactam (ZOSYN) IVPB 2.25 g  Status:  Discontinued     2.25 g 100 mL/hr over 30 Minutes Intravenous 3 times per day 01/07/13 0827 01/07/13 1754   01/06/13 1915  vancomycin (VANCOCIN) IVPB 1000 mg/200 mL premix     1,000 mg 200 mL/hr over 60 Minutes Intravenous   Once 01/06/13 1909 01/06/13 2019   01/06/13 1330  ceFEPIme (MAXIPIME) 2 g in dextrose 5 % 50 mL IVPB     2 g 100 mL/hr over 30 Minutes Intravenous  Once 01/06/13 1320 01/06/13 1520   01/06/13 1300  vancomycin (VANCOCIN) IVPB 1000 mg/200 mL premix     1,000 mg 200 mL/hr over 60 Minutes Intravenous  Once 01/06/13 1258 01/06/13 1426      Assessment: 52yo male with MSSA bacteremia receiving day #11 ancef.  Blood cultures drawn here on 1/28 & 1/29 are negative-final.  Ancef adjusted for ESRD.    Goal of Therapy:  Resolution of infection  Plan:  Continue Ancef 2gm IV MWF with HD  Davonna Belling, PharmD, BCPS Pager [phone]

## 2013-01-18 NOTE — Progress Notes (Addendum)
2 Days Post-Op Procedure(s) (LRB): TRANSESOPHAGEAL ECHOCARDIOGRAM (TEE) (N/A) Subjective:  Mr. Blankley has no new complaints this morning.    Objective: Vital signs in last 24 hours: Temp:  [98.1 F (36.7 C)-98.4 F (36.9 C)] 98.2 F (36.8 C) (02/09 0700) Pulse Rate:  [79-89] 80 (02/09 0815) Cardiac Rhythm:  [-] Normal sinus rhythm (02/09 0423) Resp:  [15-22] 17 (02/09 0815) BP: (115-124)/(65-74) 115/69 mmHg (02/09 0815) SpO2:  [97 %-98 %] 97 % (02/09 0815) Weight:  [206 lb 9.1 oz (93.7 kg)] 206 lb 9.1 oz (93.7 kg) (02/09 0423)  Intake/Output from previous day: 02/08 0701 - 02/09 0700 In: 480 [P.O.:480] Out: 340 [Chest Tube:340] Intake/Output this shift: Total I/O In: -  Out: 90 [Chest Tube:90]  General appearance: alert, cooperative and no distress Heart: regular rate and rhythm Lungs: CTA on left, audible air leak on right Abdomen: soft, non-tender; bowel sounds normal; no masses,  no organomegaly Wound: clean and dry  Lab Results:  Recent Labs  01/16/13 0525  WBC 15.3*  HGB 7.8*  HCT 25.0*  PLT 358   BMET:  Recent Labs  01/16/13 0525  NA 137  K 4.9  CL 95*  CO2 28  GLUCOSE 95  BUN 53*  CREATININE 11.90*  CALCIUM 10.3    PT/INR:  Recent Labs  01/18/13 0739  LABPROT 25.1*  INR 2.41*   ABG    Component Value Date/Time   PHART 7.566* 12/19/2012 0535   HCO3 26.9* 12/19/2012 0535   TCO2 28 12/19/2012 0535   O2SAT 100.0 12/19/2012 0535   CBG (last 3)  No results found for this basename: GLUCAP,  in the last 72 hours  Assessment/Plan: S/P Procedure(s) (LRB): TRANSESOPHAGEAL ECHOCARDIOGRAM (TEE) (N/A)  1. Chest tube- small air leak with cough, CXR shows re-expansions of lung, with tiny residual basilar pneumothorax, will leave tube on suction for now 2. ESRD- nephrology following 3. MSSA Bacteremia- on ABX 4. Dispo- continue current care   LOS: 12 days    BARRETT, ERIN 01/18/2013  Patient seen and examined Agree with above He still has a  small air leak - will try on water seal again today

## 2013-01-19 ENCOUNTER — Encounter (HOSPITAL_COMMUNITY): Payer: Self-pay | Admitting: Cardiovascular Disease

## 2013-01-19 ENCOUNTER — Inpatient Hospital Stay (HOSPITAL_COMMUNITY): Payer: Medicare Other

## 2013-01-19 LAB — CBC
HCT: 23.2 % — ABNORMAL LOW (ref 39.0–52.0)
MCV: 92.1 fL (ref 78.0–100.0)
RBC: 2.52 MIL/uL — ABNORMAL LOW (ref 4.22–5.81)
WBC: 13.5 10*3/uL — ABNORMAL HIGH (ref 4.0–10.5)

## 2013-01-19 LAB — RENAL FUNCTION PANEL
BUN: 68 mg/dL — ABNORMAL HIGH (ref 6–23)
CO2: 26 mEq/L (ref 19–32)
Chloride: 94 mEq/L — ABNORMAL LOW (ref 96–112)
Glucose, Bld: 86 mg/dL (ref 70–99)
Potassium: 5.5 mEq/L — ABNORMAL HIGH (ref 3.5–5.1)

## 2013-01-19 MED ORDER — DOXERCALCIFEROL 4 MCG/2ML IV SOLN
INTRAVENOUS | Status: AC
  Administered 2013-01-19: 2 ug via INTRAVENOUS
  Filled 2013-01-19: qty 2

## 2013-01-19 MED ORDER — WARFARIN SODIUM 5 MG PO TABS
5.0000 mg | ORAL_TABLET | ORAL | Status: DC
  Filled 2013-01-19: qty 1

## 2013-01-19 MED ORDER — OXYCODONE-ACETAMINOPHEN 5-325 MG PO TABS
ORAL_TABLET | ORAL | Status: AC
  Administered 2013-01-19: 2 via ORAL
  Filled 2013-01-19: qty 2

## 2013-01-19 MED ORDER — WARFARIN SODIUM 2.5 MG PO TABS
2.5000 mg | ORAL_TABLET | ORAL | Status: DC
  Administered 2013-01-19: 2.5 mg via ORAL
  Filled 2013-01-19: qty 1

## 2013-01-19 NOTE — Progress Notes (Signed)
Patient ID: [name] [name], male   DOB: 1961-11-28, 51 y.o.   MRN: [id number]         [facility] Infectious [facility]    Date of Admission:  01/06/2013   Total days of antibiotics 13         Principal Problem:   Bacteremia Active Problems:   End-stage renal disease on hemodialysis   Anemia of chronic disease   Internal jugular vein thrombosis   . calcium acetate  2,001 mg Oral With snacks  . calcium acetate  2,668 mg Oral TID WC  .  ceFAZolin (ANCEF) IV  2 g Intravenous Q M,W,F-2000  . cinacalcet  180 mg Oral Q2000  . coumadin book   Does not apply Once  . darbepoetin (ARANESP) injection - DIALYSIS  150 mcg Intravenous Q Fri-HD  . doxercalciferol  2 mcg Intravenous Q M,W,F-HD  . metoprolol tartrate  25 mg Oral BID  . multivitamin  1 tablet Oral QHS  . psyllium  1 packet Oral Daily  . senna-docusate  1 tablet Oral BID  . warfarin  5 mg Oral q1800  . Warfarin - Physician Dosing Inpatient   Does not apply q1800    Subjective: He is currently on hemodialysis. He is feeling better.   Objective: Temp:  [98 F (36.7 C)-99.5 F (37.5 C)] 98.8 F (37.1 C) (02/10 0942) Pulse Rate:  [78-96] 96 (02/10 0942) Resp:  [17-23] 17 (02/10 0942) BP: (92-128)/(46-75) 109/59 mmHg (02/10 0942) SpO2:  [95 %-100 %] 95 % (02/10 0942) Weight:  [92.1 kg (203 lb 0.7 oz)-94.5 kg (208 lb 5.4 oz)] 93.5 kg (206 lb 2.1 oz) (02/10 0930)  General: Alert  Skin: No splitter or conjunctival hemorrhages  Lungs: Clear anteriorly  Cor: Regular S1 and S2 with no murmurs  Lab Results Lab Results  Component Value Date   WBC 13.5* 01/19/2013   HGB 7.4* 01/19/2013   HCT 23.2* 01/19/2013   MCV 92.1 01/19/2013   PLT 444* 01/19/2013    Lab Results  Component Value Date   CREATININE 14.18* 01/19/2013   BUN 68* 01/19/2013   NA 135 01/19/2013   K 5.5* 01/19/2013   CL 94* 01/19/2013   CO2 26 01/19/2013    Lab Results  Component Value Date   ALT 11 01/06/2013   AST 13 01/06/2013   ALKPHOS 70 01/06/2013   BILITOT 0.5 01/06/2013      Microbiology: Recent Results (from the past 240 hour(s))  CULTURE, RESPIRATORY     Status: None   Collection Time    01/09/13 12:45 PM      Result Value Range Status   Specimen Description BRONCHIAL WASHINGS   Final   Special Requests PT ON VANCOMYCIN   Final   Gram Stain     Final   Value: RARE WBC PRESENT, PREDOMINANTLY MONONUCLEAR     NO ORGANISMS SEEN   Culture NO GROWTH 2 DAYS   Final   Report Status 01/12/2013 FINAL   Final    Studies/Results: Dg Chest Port 1 View  01/18/2013  *RADIOLOGY REPORT*  Clinical Data: Follow right pneumothorax  PORTABLE CHEST - 1 VIEW  Comparison: Prior chest x-ray 01/17/2013  Findings: Stable position of right-sided thoracostomy tube.  Trace residual right basilar pneumothorax.  Similar degree of residual pleural fluid versus pleural thickening at the apex and laterally. Diffuse linear opacities throughout the right lung consistent with areas of scarring and chronic/rounded atelectasis are similar to prior.  Similar enlargement of the cardiopericardial silhouette.  Background pulmonary vascular congestion is unchanged.  IMPRESSION:  No significant interval change in the appearance of the chest. Stable trace right basilar pneumothorax.   Original Report Authenticated By: Jacqulynn Cadet, M.D.     Assessment: He is improving on cefazolin for MSSA bacteremia. His TEE did not reveal any evidence of endocarditis. I would still treat him for a total of 4 weeks given the complicated nature of his recent VATS and persistent air leak requiring chest tubes.  Plan: 1. Continue cefazolin after hemodialysis for 15 more days through January 25 2. Please call if I can be of further assistance while here  Michel Bickers, MD [facility] for [place] [phone] pager   [phone] cell 01/19/2013, 10:48 AM

## 2013-01-19 NOTE — Procedures (Signed)
Reports best treatment ever today.  Hemodynamically stable. [name] [name] C

## 2013-01-19 NOTE — Progress Notes (Signed)
                    [address]            [place] [address]          [phone]     3 Days Post-Op Procedure(s) (LRB): TRANSESOPHAGEAL ECHOCARDIOGRAM (TEE) (N/A)  Subjective: Pt seen on HD unit.  No new complaints.   Objective: Vital signs in last 24 hours: Patient Vitals for the past 24 hrs:  BP Temp Temp src Pulse Resp SpO2 Weight  01/19/13 0830 92/61 mmHg - - 89 17 - -  01/19/13 0800 113/72 mmHg - - 92 20 - -  01/19/13 0730 117/62 mmHg - - 90 17 - -  01/19/13 0700 128/62 mmHg - - 80 18 - -  01/19/13 0650 127/71 mmHg - - 83 21 100 % 208 lb 5.4 oz (94.5 kg)  01/19/13 0338 110/62 mmHg 98.3 F (36.8 C) Oral 84 20 99 % 203 lb 0.7 oz (92.1 kg)  01/18/13 2356 115/57 mmHg 98 F (36.7 C) Oral 78 21 97 % -  01/18/13 2035 106/60 mmHg 99.1 F (37.3 C) Oral 83 20 96 % -  01/18/13 1800 - 99.5 F (37.5 C) Oral - - - -  01/18/13 1520 111/67 mmHg 99 F (37.2 C) Oral - 23 96 % -  01/18/13 1310 115/75 mmHg 98.3 F (36.8 C) Oral - 20 96 % -   Current Weight  01/19/13 208 lb 5.4 oz (94.5 kg)     Intake/Output from previous day: 02/09 0701 - 02/10 0700 In: 240 [P.O.:240] Out: 90 [Chest Tube:90]    PHYSICAL EXAM:  Heart: RRR Lungs: Clear, slightly decreased BS in R base Chest tube: Intermittent 1/7 air leak   Lab Results: CBC: Recent Labs  01/19/13 0708  WBC 13.5*  HGB 7.4*  HCT 23.2*  PLT 444*   BMET:  Recent Labs  01/19/13 0708  NA 135  K 5.5*  CL 94*  CO2 26  GLUCOSE 86  BUN 68*  CREATININE 14.18*  CALCIUM 11.1*    PT/INR:  Recent Labs  01/19/13 0540  LABPROT 28.2*  INR 2.82*      Assessment/Plan: S/P Procedure(s) (LRB): TRANSESOPHAGEAL ECHOCARDIOGRAM (TEE) (N/A) Still with tiny intermittent leak.  No CXR done yet this am.  Continue CT to water seal for now. Follow up am CXR. Medical issues per renal.   LOS: 13 days    COLLINS,GINA H 01/19/2013

## 2013-01-19 NOTE — Progress Notes (Signed)
Subjective: Interval History:" I'm having my best treatment ever"!  Objective: Vital signs in last 24 hours: Temp:  [98 F (36.7 [name])-99.5 F (37.5 [name])] 98.3 F (36.8 [name]) (02/10 0338) Pulse Rate:  [78-92] 89 (02/10 0830) Resp:  [17-23] 17 (02/10 0830) BP: (92-128)/(57-75) 92/61 mmHg (02/10 0830) SpO2:  [96 %-100 %] 100 % (02/10 0650) Weight:  [92.1 kg (203 lb 0.7 oz)-94.5 kg (208 lb 5.4 oz)] 94.5 kg (208 lb 5.4 oz) (02/10 0650) Weight change: -1.6 kg (-3 lb 8.4 oz)  Intake/Output from previous day: 02/09 0701 - 02/10 0700 In: 240 [P.O.:240] Out: 90 [Chest Tube:90] Intake/Output this shift:    General appearance: alert and cooperative Head: Normocephalic, without obvious abnormality, atraumatic Resp: clear to auscultation bilaterally anteriorly Cardio: regular rate and rhythm, S1, S2 normal, no murmur, click, rub or gallop GI: mild tenderness, nonspecific  Lab Results:  Recent Labs  01/19/13 0708  WBC 13.5*  HGB 7.4*  HCT 23.2*  PLT 444*   BMET:  Recent Labs  01/19/13 0708  NA 135  K 5.5*  CL 94*  CO2 26  GLUCOSE 86  BUN 68*  CREATININE 14.18*  CALCIUM 11.1*   No results found for this basename: PTH,  in the last 72 hours Iron Studies: No results found for this basename: IRON, TIBC, TRANSFERRIN, FERRITIN,  in the last 72 hours Studies/Results: Dg Chest Port 1 View  01/18/2013  *RADIOLOGY REPORT*  Clinical Data: Follow right pneumothorax  PORTABLE CHEST - 1 VIEW  Comparison: Prior chest x-ray 01/17/2013  Findings: Stable position of right-sided thoracostomy tube.  Trace residual right basilar pneumothorax.  Similar degree of residual pleural fluid versus pleural thickening at the apex and laterally. Diffuse linear opacities throughout the right lung consistent with areas of scarring and chronic/rounded atelectasis are similar to prior.  Similar enlargement of the cardiopericardial silhouette. Background pulmonary vascular congestion is unchanged.  IMPRESSION:  No  significant interval change in the appearance of the chest. Stable trace right basilar pneumothorax.   Original Report Authenticated By: Jacqulynn Cadet, M.D.     Scheduled: . calcium acetate  2,001 mg Oral With snacks  . calcium acetate  2,668 mg Oral TID WC  .  ceFAZolin (ANCEF) IV  2 g Intravenous Q M,W,F-2000  . cinacalcet  180 mg Oral Q2000  . coumadin book   Does not apply Once  . darbepoetin (ARANESP) injection - DIALYSIS  150 mcg Intravenous Q Fri-HD  . doxercalciferol  2 mcg Intravenous Q M,W,F-HD  . metoprolol tartrate  25 mg Oral BID  . multivitamin  1 tablet Oral QHS  . psyllium  1 packet Oral Daily  . senna-docusate  1 tablet Oral BID  . warfarin  5 mg Oral q1800  . Warfarin - Physician Dosing Inpatient   Does not apply q1800    Assessment/ Plan:  Pt is a 52 y.o. yo male with ESRD who was admitted on 01/06/2013 with fever and MSSA bacteremia; had recent (last admission) VATS for pleural effusion with chest tubes in place  1. MSSA bacteremia ( data in paper chart)  On ancef , Source uncertain,TEE negative, ID following , ? Duration of rx  2 ESRD:  Continue MWF schedule via AVG. Normally runs 3 hours and 15 min HD normally at [facility].  Will have new EDW at discharge (93.5-94)  3 Hypertension:  Meds/volume control with HD  4. Anemia of ESRD:  Darbe 150/week  Transfused one unit of PRBC on 1/31. Will try  to avoid PRBCs of tolerated 5. Metabolic Bone Disease:  Continuing home meds of phoslo and sensipar, as well as IV hectorol with HD  6. CT- s/p VATS - persistent air leak per CTS s/p enterbronchial valve  7.CT findings of chronically calcified fibrin sheath and/or chronically calcified clot in the right IJ from a previous HD catheterBecause this area is calcified it is NOT recommended that a dilatation of this area be performed. Dr. Bridgett Larsson recommends anticoagulation with heparin to coumadin for 3-6 months with F/U at their office and possible repeat CT  of the chest     LOS: 13 days   [name] [name] 01/19/2013,8:50 AM

## 2013-01-20 ENCOUNTER — Inpatient Hospital Stay (HOSPITAL_COMMUNITY): Payer: Medicare Other

## 2013-01-20 LAB — PROTIME-INR: INR: 2.28 — ABNORMAL HIGH (ref 0.00–1.49)

## 2013-01-20 MED ORDER — WARFARIN SODIUM 2.5 MG PO TABS: 2.5000 mg | ORAL_TABLET | ORAL | Status: DC

## 2013-01-20 MED ORDER — WARFARIN SODIUM 5 MG PO TABS: 5.0000 mg | ORAL_TABLET | ORAL | Status: DC

## 2013-01-20 MED ORDER — CEFAZOLIN SODIUM-DEXTROSE 2-3 GM-% IV SOLR: INTRAVENOUS | Status: DC

## 2013-01-20 MED ORDER — OXYCODONE-ACETAMINOPHEN 5-325 MG PO TABS: 1.0000 | ORAL_TABLET | ORAL | Status: DC | PRN

## 2013-01-20 NOTE — Progress Notes (Signed)
Assessment/ Plan:  Pt is a 52 y.o. yo male with ESRD who was admitted on 01/06/2013 with fever and MSSA bacteremia; had recent (last admission) VATS for pleural effusion with chest tubes in place  1. MSSA bacteremia ( data in paper chart)  On ancef 2000 mg ea tx , Source uncertain,TEE negative, ID following , Total of 4 weeks of treatment 2 ESRD: Continue MWF schedule via AVG. Normally runs 3 hours and 15 min HD normally at [facility].      Will have new EDW at discharge (93.5-94Kg) : OK for discharge 3 Hypertension:  Meds/volume control with HD  4. Anemia of ESRD: Transfused one unit of PRBC on 1/31.  ON ESA Rx  5. Metabolic Bone Disease: Continuing home meds of phoslo and sensipar, as well as IV hectorol with HD  6. CT- s/p VATS - persistent air leak per CTS s/p enterbronchial valve  7.CT findings of chronically calcified fibrin sheath and/or chronically calcified clot in the right IJ from a previous HD catheterBecause this area is calcified it is NOT recommended that a dilatation of this area be performed. Dr. Bridgett Larsson recommends anticoagulation with heparin to coumadin for 3-6 months with F/U at their office and possible repeat CT of the chest   Subjective: Interval History: Ready for discharge  Objective: Vital signs in last 24 hours: Temp:  [98 F (36.7 C)-100.7 F (38.2 C)] 98 F (36.7 C) (02/11 0800) Pulse Rate:  [79-100] 79 (02/11 0005) Resp:  [11-20] 15 (02/11 1200) BP: (92-120)/(51-68) 115/61 mmHg (02/11 1200) SpO2:  [95 %-100 %] 96 % (02/11 1200) Weight:  [93.3 kg (205 lb 11 oz)] 93.3 kg (205 lb 11 oz) (02/11 0449) Weight change: 1.4 kg (3 lb 1.4 oz)  Intake/Output from previous day: 02/10 0701 - 02/11 0700 In: 770 [P.O.:720; IV Piggyback:50] Out: [id number] [Chest Tube:170] Intake/Output this shift: Total I/O In: 450 [P.O.:450] Out: 50 [Chest Tube:50]  General appearance: alert and cooperative Extremities: edema trace and right arm av access ok  Lab  Results:  Recent Labs  01/19/13 0708  WBC 13.5*  HGB 7.4*  HCT 23.2*  PLT 444*   BMET:  Recent Labs  01/19/13 0708  NA 135  K 5.5*  CL 94*  CO2 26  GLUCOSE 86  BUN 68*  CREATININE 14.18*  CALCIUM 11.1*   No results found for this basename: PTH,  in the last 72 hours Iron Studies: No results found for this basename: IRON, TIBC, TRANSFERRIN, FERRITIN,  in the last 72 hours Studies/Results: Dg Chest Port 1 View  01/20/2013  *RADIOLOGY REPORT*  Clinical Data: Post right decortication.  PORTABLE CHEST - 1 VIEW  Comparison: 01/19/2013.  Findings: Right-sided chest tube remains place with postoperative changes including radiopaque structures right infrahilar region noted.  Inferiorly located loculated right-sided pneumothorax/hydropneumothorax relatively similar to the prior exam.  Pulmonary vascular prominence left lung has progressed slightly since prior exam.  Cardiomegaly.  Prominence of mediastinum.  IMPRESSION: Similar appearance of inferiorly located right-sided pneumothorax/hydropneumothorax.  Postoperative changes right lung otherwise similar to the prior exam.  Cardiomegaly and mediastinal prominence minimally more notable than on prior exam.  Left lung pulmonary vascular congestion.   Original Report Authenticated By: Genia Del, M.D.    Dg Chest Port 1 View  01/19/2013  *RADIOLOGY REPORT*  Clinical Data: Post decortication.  PORTABLE CHEST - 1 VIEW  Comparison: 01/18/2013.  Findings: Increase in size of right base pneumothorax.  This may represent a hydropneumothorax.  Chest tube remains  in place. Pleural thickening throughout remainder of right thorax. Postsurgical changes.  Patchy consolidation right lung.  Pulmonary vascular prominence.  No evidence of left-sided pneumothorax.  Heart size difficult to adequately assessed.  Calcified aorta.  IMPRESSION: Increase in size of inferiorly located right-sided pneumothorax/hydropneumothorax.  This is a call report.   Original Report  Authenticated By: Genia Del, M.D.    Scheduled: . calcium acetate  2,001 mg Oral With snacks  . calcium acetate  2,668 mg Oral TID WC  .  ceFAZolin (ANCEF) IV  2 g Intravenous Q M,W,F-2000  . cinacalcet  180 mg Oral Q2000  . coumadin book   Does not apply Once  . darbepoetin (ARANESP) injection - DIALYSIS  150 mcg Intravenous Q Fri-HD  . doxercalciferol  2 mcg Intravenous Q M,W,F-HD  . metoprolol tartrate  25 mg Oral BID  . multivitamin  1 tablet Oral QHS  . psyllium  1 packet Oral Daily  . senna-docusate  1 tablet Oral BID  . warfarin  2.5 mg Oral Q M,W,F-1800  . warfarin  5 mg Oral Q T,Th,S,Su-1800  . Warfarin - Physician Dosing Inpatient   Does not apply q1800      LOS: 14 days   Dinesh Ulysse C 01/20/2013,12:33 PM

## 2013-01-20 NOTE — Progress Notes (Signed)
Patient's right flank CT changed to mini-express, patient tolerated well. Will continue to monitor.

## 2013-01-20 NOTE — Progress Notes (Signed)
Patient d/c'd home per MD order.  Patient given d/c instructions/prescriptions and all questions answered.  Patient sent home with right CT to mini-express and home health has been set-up.  Patient d/c'd wheelchair with family and NS/MT.

## 2013-01-20 NOTE — Progress Notes (Signed)
Audible crackling/gurgling heard from chest tube during inspiration and expiration; pt appears to be in no respiratory distress. Pt refused assessment of site as well as dressing reinforcement. Will continue to monitor pt.  Anda Kraft [place]

## 2013-01-20 NOTE — Discharge Summary (Signed)
Physician Discharge Summary  Patient ID: [name] [name] MRN: [id number] DOB/AGE: Apr 11, 1961 52 y.o.  Admit date: 01/06/2013 Discharge date: 01/20/2013  Admission Diagnoses:  Patient Active Problem List  Diagnosis  . End-stage renal disease on hemodialysis  . Secondary hyperparathyroidism  . Hypertension  . Polysubstance abuse  . Anemia of chronic disease  . Tobacco abuse  . Hyperkalemia  . Acute blood loss anemia  . Sinus tachycardia, resolved with PRBC resuscitation  . Lower GI bleed  . ESRD (end stage renal disease)  . H/O: hypertension  . Pleural effusion, right  . Benign neoplasm of colon  . Diverticulosis of colon (without mention of hemorrhage)  . Bacteremia  . Internal jugular vein thrombosis   Discharge Diagnoses:   Patient Active Problem List  Diagnosis  . End-stage renal disease on hemodialysis  . Secondary hyperparathyroidism  . Hypertension  . Polysubstance abuse  . Anemia of chronic disease  . Tobacco abuse  . Hyperkalemia  . Acute blood loss anemia  . Sinus tachycardia, resolved with PRBC resuscitation  . Lower GI bleed  . ESRD (end stage renal disease)  . H/O: hypertension  . Pleural effusion, right  . Benign neoplasm of colon  . Diverticulosis of colon (without mention of hemorrhage)  . Bacteremia  . Internal jugular vein thrombosis   Discharged Condition: good  History of Present Illness:   [name] [name] is a 52 yo [place] American man with ESRD who is well known to [facility].  He is S/P Right VATS performed on 12/18/2012 for a loculated right pleural effusion.  The patients hospital course was complicated by a persistent air leak in the right sided chest tube.  It was felt that it would be unsafe to remove the patient's chest tube and his chest tube was placed to a Mini Express collection chamber and the patient was discharged home on 12/30/2012.  The patient resumed his home dialysis regimen.  However, on 01/05/2013 during dialysis the patients session was  discontinued after the patient developed a complaint of chills.  He was referred to the urgent care clinic where blood cultures were drawn and the patient was given antibiotics prior to being discharged home.  However on 01/06/2013 the patient was notified that he had "septic blood" and was instructed to report to the Emergency Department.  The patient was febrile with temperature as high as 100.  He denied other symptoms of rhinorrhea, cough, sore throat, nausea, vomiting or diarrhea.  The patient was subsequently admitted for further care.        Hospital Course:   Upon admission patient's temperature was elevated to 102.3.  He was felt to be septic and placed on antibiotics.  Chest xray showed no change in the patient's previous pneumothorax and Thoracic surgery was consulted for further assistance.  Evaluation revealed a continued air leak from the patient's chest tube and it was felt to leave the chest tube in place hooked to suction.  The patient resumed his home dialysis regimen, however despite his antibiotics clinically the patient did not seem to be improving.  CT scan of the chest was performed and did not show evidence of empyema.  The patient also underwent TEE which did not show any evidence of endocarditis.  Infectious disease was consulted and recommended the patient complete a six week course of IV Ancef.  The patient developed a complaint of right upper extremity pain and was found to have a DVT in the right jugular vein.  He was subsequently placed  on Coumadin.  Due to the patient's persistent air leak from his right chest tube.  He was taken to the operating room on 01/09/2013.  He underwent Bronchoscopy with placed on endobronchial valves in the medial and posterior basilar segmental bronchi of the Right Lower Lobe.  The patient is doing better.  He is stabilized on ABX, is no longer febrile and his leukocytosis has resolved.   Despite placement of bronchial valves his chest tube continues to  have an air leak.  He underwent bedside talc pleurodesis with good results.  Follow up CXR shows re-expansion of his right lung.  However, there continues to be an air leak from the chest tube.  At this time we will leave his chest tube in place and connect this to a Mini Express collection chamber.  The patient is medically stable at this time.  He will resume his home dialysis regimen of MWF.  He needs to continue his IV Ancef to be administered during his dialysis sessions, with the last dose being given on February 03, 2013.  He will also remain on Coumadin for his DVT with goal range of 2.0-3.0 for his INR, which currently is 2.28.  The patient will follow up with Dr. Roxan Hockey in one week for repeat CXR and possible removal of chest tube.  He will also need to follow up with Infectious disease and his Nephrologist as needed.        Consults: cardiology, ID and nephrology  Treatments: dialysis: Hemodialysis  Surgery: Bronchoscopy, placement of endobronchial valves in the  medial and posterior basilar segmental bronchi of the right lower lobe.  Disposition: 01-Home or Self Care     Medication List    TAKE these medications       calcium acetate 667 MG capsule  Commonly known as:  PHOSLO  Take 2,668 mg by mouth 3 (three) times daily with meals.     calcium acetate 667 MG capsule  Commonly known as:  PHOSLO  Take 2,001 mg by mouth with snacks.     ceFAZolin 2-3 GM-% Solr  Commonly known as:  ANCEF  2g IVPB/ 50 mL Premix solution, every MWF during Dialysis......Marland Kitchen Last Dose is February 03, 2013     cinacalcet 90 MG tablet  Commonly known as:  SENSIPAR  Take 180 mg by mouth daily.     darbepoetin 100 MCG/0.5ML Soln  Commonly known as:  ARANESP  Inject 100 mcg into the vein every Friday with hemodialysis.     doxercalciferol 4 MCG/2ML injection  Commonly known as:  HECTOROL  Inject 4.5 mcg into the vein every Monday, Wednesday, and Friday with hemodialysis.     metoprolol  tartrate 25 MG tablet  Commonly known as:  LOPRESSOR  Take 25 mg by mouth 2 (two) times daily.     multivitamin Tabs tablet  Take 1 tablet by mouth at bedtime.     oxyCODONE-acetaminophen 5-325 MG per tablet  Commonly known as:  PERCOCET/ROXICET  Take 1-2 tablets by mouth every 4 (four) hours as needed. For pain     warfarin 2.5 MG tablet  Commonly known as:  COUMADIN  Take 1 tablet (2.5 mg total) by mouth every Monday, Wednesday, and Friday at 6 PM.     warfarin 5 MG tablet  Commonly known as:  COUMADIN  Take 1 tablet (5 mg total) by mouth every Tuesday, Thursday, Saturday, and Sunday at 6 PM.       Follow-up Information   Follow up with [facility]  Loletha Grayer, MD.   Contact information:   [address] [place] [address] [phone]       Follow up with [facility].   Contact information:   [place]       Please follow up. (PT/INR Draw)       Follow up with Hemodialysis. (Please get PT/INR drawn during hemodialysis)       Signed: Tanay Massiah 01/20/2013, 9:21 AM

## 2013-01-27 ENCOUNTER — Other Ambulatory Visit: Payer: Self-pay | Admitting: *Deleted

## 2013-01-27 ENCOUNTER — Encounter: Payer: Self-pay | Admitting: Thoracic Surgery (Cardiothoracic Vascular Surgery)

## 2013-01-27 ENCOUNTER — Ambulatory Visit
Admission: RE | Admit: 2013-01-27 | Discharge: 2013-01-27 | Disposition: A | Discharge status: Home / Self Care | Payer: Medicare Other | Source: Ambulatory Visit | Attending: Cardiothoracic Surgery | Admitting: Cardiothoracic Surgery

## 2013-01-27 ENCOUNTER — Ambulatory Visit (INDEPENDENT_AMBULATORY_CARE_PROVIDER_SITE_OTHER): Payer: Medicare Other | Admitting: Thoracic Surgery (Cardiothoracic Vascular Surgery)

## 2013-01-27 VITALS — BP 123/70 | HR 92 | Resp 16 | Ht 73.0 in | Wt 207.2 lb

## 2013-01-27 DIAGNOSIS — J90 Pleural effusion, not elsewhere classified: Secondary | ICD-10-CM

## 2013-01-27 DIAGNOSIS — IMO0002 Reserved for concepts with insufficient information to code with codable children: Secondary | ICD-10-CM

## 2013-01-27 DIAGNOSIS — G8918 Other acute postprocedural pain: Secondary | ICD-10-CM

## 2013-01-27 DIAGNOSIS — J9382 Other air leak: Secondary | ICD-10-CM

## 2013-01-27 MED ORDER — OXYCODONE-ACETAMINOPHEN 5-325 MG PO TABS: 1.0000 | ORAL_TABLET | ORAL | Status: DC | PRN

## 2013-01-27 NOTE — Progress Notes (Signed)
HPI:  Mr. Kroschel returns today for a followup appointment. He had a decortication for a chronic pleural effusion. This was complicated by a persistent air leak. He was discharged to the tube and then readmitted. We did bronchial valves and talc pleurodesis, both of which decreased the air leak but did not eliminate it completely. He insisted on being discharged home last week before I was really ready for him to go. He says he still notes some gurgling from the tube intermittently. The drainage reservoir was emptied yesterday. He's not had any fevers chills or sweats. His breathing is been stable.  Past Medical History  Diagnosis Date  . End-stage renal disease on hemodialysis   . Secondary hyperparathyroidism   . Hypertension   . Polysubstance abuse   . Anemia of chronic disease     BL Hgb ~10  . Tobacco abuse       Current Outpatient Prescriptions  Medication Sig Dispense Refill  . calcium acetate (PHOSLO) 667 MG capsule Take 2,668 mg by mouth 3 (three) times daily with meals.       . calcium acetate (PHOSLO) 667 MG capsule Take 2,001 mg by mouth with snacks.      Marland Kitchen ceFAZolin (ANCEF) 2-3 GM-% SOLR 2g IVPB/ 50 mL Premix solution, every MWF during Dialysis......Marland Kitchen Last Dose is February 03, 2013      . cinacalcet (SENSIPAR) 90 MG tablet Take 180 mg by mouth daily.      . darbepoetin (ARANESP) 100 MCG/0.5ML SOLN Inject 100 mcg into the vein every Friday with hemodialysis.      Marland Kitchen doxercalciferol (HECTOROL) 4 MCG/2ML injection Inject 4.5 mcg into the vein every Monday, Wednesday, and Friday with hemodialysis.      Marland Kitchen metoprolol tartrate (LOPRESSOR) 25 MG tablet Take 25 mg by mouth 2 (two) times daily.      . multivitamin (RENA-VIT) TABS tablet Take 1 tablet by mouth at bedtime.      Marland Kitchen oxyCODONE-acetaminophen (PERCOCET/ROXICET) 5-325 MG per tablet Take 1-2 tablets by mouth every 4 (four) hours as needed.  40 tablet  0  . warfarin (COUMADIN) 2.5 MG tablet Take 1 tablet (2.5 mg total) by mouth  every Monday, Wednesday, and Friday at 6 PM.  30 tablet  1  . warfarin (COUMADIN) 5 MG tablet Take 1 tablet (5 mg total) by mouth every Tuesday, Thursday, Saturday, and Sunday at 6 PM.  30 tablet  1   No current facility-administered medications for this visit.    Physical Exam BP 123/70  Pulse 92  Resp 16  Ht 6\' 1"  (1.854 m)  Wt 207 lb 3.7 oz (94 kg)  BMI 27.35 kg/m2  SpO2 95% 52 year male in no acute distress Chest tube site clean dry, no erythema Chest tube with intermittent air leak  Diagnostic Tests: Chest x-ray 01/27/13 Shows persistent basilar space there is also pleural thickening.  Impression: 52 year old gentleman with a persistent air leak after decortication. He has a chronic indwelling tube. His leak as been persistent despite bronchial valves and talc pleurodesis. I discussed our options with Mr. Axley. I suspect that we are going to end up having to reoperate on him. We will also need to get the bronchial valves at some point in time. He would like to avoid reoperation if at all possible. And he is requesting another trial of talc pleurodesis. I think it is unlikely to be successful but is not unreasonable to try that one more time before subjecting him to a redo operation.  He is aware of the risks of the procedures we have previously discussed them. He did tolerate the talc white well the first time.   Plan:  Talc pleurodesis as an outpatient in short stay next Tuesday

## 2013-01-28 ENCOUNTER — Other Ambulatory Visit: Payer: Self-pay | Admitting: *Deleted

## 2013-01-30 LAB — AFB CULTURE WITH SMEAR (NOT AT ARMC)

## 2013-02-03 ENCOUNTER — Ambulatory Visit: Payer: Medicare Other | Admitting: Thoracic Surgery (Cardiothoracic Vascular Surgery)

## 2013-02-03 ENCOUNTER — Encounter: Payer: Self-pay | Admitting: Thoracic Surgery (Cardiothoracic Vascular Surgery)

## 2013-02-03 NOTE — Progress Notes (Unsigned)
This encounter was created in error - please disregard.

## 2013-02-04 ENCOUNTER — Encounter (HOSPITAL_COMMUNITY): Payer: Self-pay | Admitting: Pharmacy Technician

## 2013-02-04 MED ORDER — DEXTROSE 5 % IV SOLN
1.5000 g | INTRAVENOUS | Status: DC
  Filled 2013-02-04: qty 1.5

## 2013-02-04 MED ORDER — TALC 5 G PL SUSR
3.0000 g | Freq: Once | INTRAPLEURAL | Status: DC
  Filled 2013-02-04: qty 3

## 2013-02-05 ENCOUNTER — Other Ambulatory Visit: Payer: Self-pay | Admitting: *Deleted

## 2013-02-05 ENCOUNTER — Ambulatory Visit (HOSPITAL_COMMUNITY)
Admission: RE | Admit: 2013-02-05 | Discharge: 2013-02-05 | Disposition: A | Discharge status: Home / Self Care | Payer: Medicare Other | Source: Ambulatory Visit | Attending: Thoracic Surgery (Cardiothoracic Vascular Surgery) | Admitting: Thoracic Surgery (Cardiothoracic Vascular Surgery)

## 2013-02-05 ENCOUNTER — Encounter (HOSPITAL_COMMUNITY)
Admission: RE | Disposition: A | Discharge status: Home / Self Care | Payer: Self-pay | Source: Ambulatory Visit | Attending: Thoracic Surgery (Cardiothoracic Vascular Surgery)

## 2013-02-05 DIAGNOSIS — I120 Hypertensive chronic kidney disease with stage 5 chronic kidney disease or end stage renal disease: Secondary | ICD-10-CM | POA: Insufficient documentation

## 2013-02-05 DIAGNOSIS — J90 Pleural effusion, not elsewhere classified: Secondary | ICD-10-CM

## 2013-02-05 DIAGNOSIS — N186 End stage renal disease: Secondary | ICD-10-CM | POA: Insufficient documentation

## 2013-02-05 HISTORY — PX: TALC PLEURODESIS: SHX2506

## 2013-02-05 SURGERY — PLEURODESIS, USING TALC
Anesthesia: LOCAL | Laterality: Right | Wound class: Clean Contaminated

## 2013-02-05 MED ORDER — OXYCODONE-ACETAMINOPHEN 5-325 MG PO TABS
1.0000 | ORAL_TABLET | Freq: Once | ORAL | Status: AC
  Administered 2013-02-05: 1 via ORAL

## 2013-02-05 MED ORDER — OXYCODONE-ACETAMINOPHEN 5-325 MG PO TABS
ORAL_TABLET | ORAL | Status: AC
  Administered 2013-02-05: 1 via ORAL
  Filled 2013-02-05: qty 1

## 2013-02-06 NOTE — H&P (Signed)
HPI:  [name] returns today for a followup appointment. He had a decortication for a chronic pleural effusion. This was complicated by a persistent air leak. He was discharged to the tube and then readmitted. We did bronchial valves and talc pleurodesis, both of which decreased the air leak but did not eliminate it completely. He insisted on being discharged home last week before I was really ready for him to go. He says he still notes some gurgling from the tube intermittently. The drainage reservoir was emptied yesterday. He's not had any fevers chills or sweats. His breathing is been stable.  Past Medical History   Diagnosis  Date   .  End-stage renal disease on hemodialysis    .  Secondary hyperparathyroidism    .  Hypertension    .  Polysubstance abuse    .  Anemia of chronic disease      BL Hgb ~10   .  Tobacco abuse     Current Outpatient Prescriptions   Medication  Sig  Dispense  Refill   .  calcium acetate (PHOSLO) 667 MG capsule  Take 2,668 mg by mouth 3 (three) times daily with meals.     .  calcium acetate (PHOSLO) 667 MG capsule  Take 2,001 mg by mouth with snacks.     Marland Kitchen  ceFAZolin (ANCEF) 2-3 GM-% SOLR  2g IVPB/ 50 mL Premix solution, every MWF during Dialysis......Marland Kitchen Last Dose is February 03, 2013     .  cinacalcet (SENSIPAR) 90 MG tablet  Take 180 mg by mouth daily.     .  darbepoetin (ARANESP) 100 MCG/0.5ML SOLN  Inject 100 mcg into the vein every Friday with hemodialysis.     Marland Kitchen  doxercalciferol (HECTOROL) 4 MCG/2ML injection  Inject 4.5 mcg into the vein every Monday, Wednesday, and Friday with hemodialysis.     Marland Kitchen  metoprolol tartrate (LOPRESSOR) 25 MG tablet  Take 25 mg by mouth 2 (two) times daily.     .  multivitamin (RENA-VIT) TABS tablet  Take 1 tablet by mouth at bedtime.     Marland Kitchen  oxyCODONE-acetaminophen (PERCOCET/ROXICET) 5-325 MG per tablet  Take 1-2 tablets by mouth every 4 (four) hours as needed.  40 tablet  0   .  warfarin (COUMADIN) 2.5 MG tablet  Take 1 tablet  (2.5 mg total) by mouth every Monday, Wednesday, and Friday at 6 PM.  30 tablet  1   .  warfarin (COUMADIN) 5 MG tablet  Take 1 tablet (5 mg total) by mouth every Tuesday, Thursday, Saturday, and Sunday at 6 PM.  30 tablet  1    No current facility-administered medications for this visit.   Physical Exam  BP 123/70  Pulse 92  Resp 16  Ht 6\' 1"  (1.854 m)  Wt 207 lb 3.7 oz (94 kg)  BMI 27.35 kg/m2  SpO2 95%  51 year male in no acute distress  Chest tube site clean dry, no erythema  Chest tube with intermittent air leak  Diagnostic Tests:  Chest x-ray 01/27/13  Shows persistent basilar space there is also pleural thickening.  Impression:  52 year old gentleman with a persistent air leak after decortication. He has a chronic indwelling tube. His leak as been persistent despite bronchial valves and talc pleurodesis. I discussed our options with [name] [name]. I suspect that we are going to end up having to reoperate on him. We will also need to get the bronchial valves at some point in time. He would like to  avoid reoperation if at all possible. And he is requesting another trial of talc pleurodesis. I think it is unlikely to be successful but is not unreasonable to try that one more time before subjecting him to a redo operation. He is aware of the risks of the procedures we have previously discussed them. He did tolerate the talc white well the first time.  Plan:  Talc pleurodesis as an outpatient in short stay next Tuesday

## 2013-02-07 HISTORY — PX: CHEST TUBE INSERTION: SHX231

## 2013-02-08 ENCOUNTER — Encounter (HOSPITAL_COMMUNITY): Payer: Self-pay | Admitting: Thoracic Surgery (Cardiothoracic Vascular Surgery)

## 2013-02-10 ENCOUNTER — Ambulatory Visit
Admission: RE | Admit: 2013-02-10 | Discharge: 2013-02-10 | Disposition: A | Discharge status: Home / Self Care | Payer: Medicare Other | Source: Ambulatory Visit | Attending: Thoracic Surgery (Cardiothoracic Vascular Surgery) | Admitting: Thoracic Surgery (Cardiothoracic Vascular Surgery)

## 2013-02-10 ENCOUNTER — Other Ambulatory Visit: Payer: Self-pay

## 2013-02-10 ENCOUNTER — Ambulatory Visit (INDEPENDENT_AMBULATORY_CARE_PROVIDER_SITE_OTHER): Payer: Medicare Other | Admitting: Thoracic Surgery (Cardiothoracic Vascular Surgery)

## 2013-02-10 ENCOUNTER — Encounter: Payer: Self-pay | Admitting: Thoracic Surgery (Cardiothoracic Vascular Surgery)

## 2013-02-10 VITALS — BP 139/82 | HR 100 | Resp 16 | Ht 73.0 in | Wt 208.3 lb

## 2013-02-10 DIAGNOSIS — J90 Pleural effusion, not elsewhere classified: Secondary | ICD-10-CM

## 2013-02-10 DIAGNOSIS — Z09 Encounter for follow-up examination after completed treatment for conditions other than malignant neoplasm: Secondary | ICD-10-CM

## 2013-02-10 DIAGNOSIS — J930 Spontaneous tension pneumothorax: Secondary | ICD-10-CM

## 2013-02-10 NOTE — Progress Notes (Signed)
HPI:  Mr. Fucile returns today for followup of his persistent air leak. He is a 52 year old gentleman who had a right thoracoscopic drainage of a chronic pleural effusion and decortication of the right lung back in January. Postoperatively this was complicated by prolonged air leak. We did place bronchial valves which decreased the air leak but never eliminated it completely. At his last office visit I recommended that we proceed with a redo right vats and possible thoracotomy, however he wanted to try Doppler these were more time. That was done last Thursday. He did not have any adverse effects from the talc pleurodesis. He still has pain at the chest tube site. He's not had any fevers or chills or sweats. He's not having any difficulty with his breathing.  Past Medical History  Diagnosis Date  . End-stage renal disease on hemodialysis   . Secondary hyperparathyroidism   . Hypertension   . Polysubstance abuse   . Anemia of chronic disease     BL Hgb ~10  . Tobacco abuse       Current Outpatient Prescriptions  Medication Sig Dispense Refill  . calcium acetate (PHOSLO) 667 MG capsule Take 2,668 mg by mouth 3 (three) times daily with meals.       . calcium acetate (PHOSLO) 667 MG capsule Take 2,001 mg by mouth with snacks.      Marland Kitchen doxercalciferol (HECTOROL) 4 MCG/2ML injection Inject 4.5 mcg into the vein every Monday, Wednesday, and Friday with hemodialysis.      Marland Kitchen metoprolol tartrate (LOPRESSOR) 25 MG tablet Take 25 mg by mouth 2 (two) times daily.      . multivitamin (RENA-VIT) TABS tablet Take 1 tablet by mouth at bedtime.      Marland Kitchen oxyCODONE-acetaminophen (PERCOCET/ROXICET) 5-325 MG per tablet Take 1-2 tablets by mouth every 4 (four) hours as needed.  80 tablet  0  . warfarin (COUMADIN) 2.5 MG tablet Take 1 tablet (2.5 mg total) by mouth every Monday, Wednesday, and Friday at 6 PM.  30 tablet  1  . warfarin (COUMADIN) 5 MG tablet Take 1 tablet (5 mg total) by mouth every Tuesday, Thursday,  Saturday, and Sunday at 6 PM.  30 tablet  1   No current facility-administered medications for this visit.    Physical Exam BP 139/82  Pulse 100  Resp 16  Ht 6\' 1"  (1.854 m)  Wt 208 lb 5.4 oz (94.5 kg)  BMI 27.49 kg/m2  SpO26 52% 52 year old male in no acute distress Chest tube in place with murky drainage (400 cc since last Thursday) positive air leak  Diagnostic Tests: Chest x-ray shows stable postoperative changes in the right chest  Impression: Mr. Fouraker is a 51 year old gentleman with a persistent air leak after decortication for chronic right pleural effusion. This was a very difficult procedure with a dense fibrous peel on the lung. He has failed all conservative maneuvers to avoid reoperation for the air leak. However he continues to leak air.  I had a long discussion with Mr. Schwindt regarding the management of this air leak. I do think that at this point reoperation is a necessity. He understands that we will try to start with a vats type procedure, however, it is very likely will require thoracotomy. To control the bronchopleural fistula we may have to use pieces, sutures, and/or staplers. We also need to remove the endobronchial valves and will plan to do that at the beginning of the procedure. I discussed with him the indications, risks, benefits, and alternatives.  He understands there is no guarantee of success. He does understand the risk include but are not limited to death, MI, DVT, PE, bleeding, possible need for transfusion, infection, prolonged air leak, cardiac arrhythmias, as well as other unforeseeable complications. He understands and accepts the risk and agrees to proceed.  Plan: Bronchoscopy, removal of endobronchial valves, right VATS, possible thoracotomy, repair of bronchopleural fistula on Thursday, 02/19/2013. He will be admitted on the day of surgery.

## 2013-02-15 ENCOUNTER — Telehealth (HOSPITAL_COMMUNITY): Payer: Self-pay | Admitting: Emergency Medicine

## 2013-02-16 ENCOUNTER — Other Ambulatory Visit: Payer: Self-pay | Admitting: *Deleted

## 2013-02-16 DIAGNOSIS — G8918 Other acute postprocedural pain: Secondary | ICD-10-CM

## 2013-02-16 MED ORDER — OXYCODONE-ACETAMINOPHEN 5-325 MG PO TABS: 1.0000 | ORAL_TABLET | ORAL | Status: DC | PRN

## 2013-02-17 ENCOUNTER — Other Ambulatory Visit: Payer: Self-pay | Admitting: *Deleted

## 2013-02-17 ENCOUNTER — Encounter (HOSPITAL_COMMUNITY)
Admission: RE | Admit: 2013-02-17 | Discharge: 2013-02-17 | Disposition: A | Discharge status: Home / Self Care | Payer: Medicare Other | Source: Ambulatory Visit | Attending: Thoracic Surgery (Cardiothoracic Vascular Surgery) | Admitting: Thoracic Surgery (Cardiothoracic Vascular Surgery)

## 2013-02-17 ENCOUNTER — Encounter (HOSPITAL_COMMUNITY): Payer: Self-pay

## 2013-02-17 VITALS — BP 145/87 | HR 93 | Temp 98.5°F | Resp 20 | Ht 73.0 in | Wt 208.7 lb

## 2013-02-17 DIAGNOSIS — J930 Spontaneous tension pneumothorax: Secondary | ICD-10-CM

## 2013-02-17 DIAGNOSIS — J90 Pleural effusion, not elsewhere classified: Secondary | ICD-10-CM

## 2013-02-17 HISTORY — DX: Pneumonia, unspecified organism: J18.9

## 2013-02-17 HISTORY — DX: Heart failure, unspecified: I50.9

## 2013-02-17 HISTORY — DX: Personal history of other medical treatment: Z92.89

## 2013-02-17 LAB — BLOOD GAS, ARTERIAL
Drawn by: 206361
pCO2 arterial: 41.6 mmHg (ref 35.0–45.0)
pH, Arterial: 7.44 (ref 7.350–7.450)
pO2, Arterial: 73.7 mmHg — ABNORMAL LOW (ref 80.0–100.0)

## 2013-02-17 LAB — PROTIME-INR
INR: 1.34 (ref 0.00–1.49)
Prothrombin Time: 16.3 s — ABNORMAL HIGH (ref 11.6–15.2)

## 2013-02-17 LAB — CBC
HCT: 22.7 % — ABNORMAL LOW (ref 39.0–52.0)
Hemoglobin: 6.8 g/dL — CL (ref 13.0–17.0)
MCH: 27.5 pg (ref 26.0–34.0)
MCHC: 30 g/dL (ref 30.0–36.0)
MCV: 91.9 fL (ref 78.0–100.0)
Platelets: 259 10*3/uL (ref 150–400)
RBC: 2.47 MIL/uL — ABNORMAL LOW (ref 4.22–5.81)
RDW: 18.3 % — ABNORMAL HIGH (ref 11.5–15.5)
WBC: 6.5 10*3/uL (ref 4.0–10.5)

## 2013-02-17 LAB — COMPREHENSIVE METABOLIC PANEL
ALT: <5 U/L (ref 0–53)
AST: 17 U/L (ref 0–37)
Albumin: 2.2 g/dL — ABNORMAL LOW (ref 3.5–5.2)
Alkaline Phosphatase: 82 U/L (ref 39–117)
Chloride: 97 mEq/L (ref 96–112)
Creatinine, Ser: 9.53 mg/dL — ABNORMAL HIGH (ref 0.50–1.35)
Potassium: 4.8 mEq/L (ref 3.5–5.1)
Sodium: 137 mEq/L (ref 135–145)
Total Bilirubin: 0.3 mg/dL (ref 0.3–1.2)

## 2013-02-17 LAB — APTT: aPTT: 42 seconds — ABNORMAL HIGH (ref 24–37)

## 2013-02-17 LAB — SURGICAL PCR SCREEN
MRSA, PCR: NEGATIVE
Staphylococcus aureus: NEGATIVE

## 2013-02-17 NOTE — Progress Notes (Addendum)
Does not have a primary physician Dr. Bernadene Person is his kidney doctor Does not have a cardiologist. Dr. Humphrey Rolls is [place] at one time he saw but not recently. ekg or echo - in February 2014 in epic  Patient will attempt to bring urine sample in DOS. Patient states urination is very sporadic and not always daily.  Patient reported he stopped taking coumadin on Saturday.

## 2013-02-17 NOTE — Progress Notes (Signed)
CRITICAL VALUE ALERT  Critical value received:  Hemoglobin 6.8  Date of notification:  02/17/2013  Time of notification:  [id number]  Critical value read back:yes  Nurse who received alert:  S.carter RN  MD notified (1st page):  Ryan Brooks/Dr. Roxan Hockey  Time of first page:  1420   Responding MD:  Samuella Cota  Time MD responded:  [phone]

## 2013-02-17 NOTE — Pre-Procedure Instructions (Signed)
[name] [name]  02/17/2013   Your procedure is scheduled on:  Thursday, March 13th  Report to [facility] at 0730 AM.  Call this number if you have problems the morning of surgery: [phone]   Remember:   Do not eat food or drink liquids after midnight.   Take these medicines the morning of surgery with A SIP OF WATER: Lopressor, percocet if needed   Do not wear jewelry, make-up or nail polish.  Do not wear lotions, powders, or perfumes, deodorant.  Do not shave 48 hours prior to surgery. Men may shave face and neck.  Do not bring valuables to the hospital.  Contacts, dentures or bridgework may not be worn into surgery.  Leave suitcase in the car. After surgery it may be brought to your room.  For patients admitted to the hospital, checkout time is 11:00 AM the day of discharge.   Patients discharged the day of surgery will not be allowed to drive home.   Special Instructions: Shower using CHG 2 nights before surgery and the night before surgery.  If you shower the day of surgery use CHG.  Use special wash - you have one bottle of CHG for all showers.  You should use approximately 1/3 of the bottle for each shower.   Please read over the following fact sheets that you were given: Pain Booklet, Coughing and Deep Breathing, Blood Transfusion Information, MRSA Information and Surgical Site Infection Prevention

## 2013-02-18 ENCOUNTER — Other Ambulatory Visit: Payer: Self-pay | Admitting: *Deleted

## 2013-02-18 ENCOUNTER — Other Ambulatory Visit: Payer: Self-pay

## 2013-02-18 DIAGNOSIS — J90 Pleural effusion, not elsewhere classified: Secondary | ICD-10-CM

## 2013-02-18 MED ORDER — DEXTROSE 5 % IV SOLN
1.5000 g | INTRAVENOUS | Status: AC
  Administered 2013-02-19: 1.5 g via INTRAVENOUS
  Filled 2013-02-18: qty 1.5

## 2013-02-18 NOTE — Progress Notes (Signed)
Anesthesia Chart Review:  Patient is a 52 year old male scheduled for bronchoscopy/removal of intrabronchial valves, right VATS, possible thoracotomy for drainage of effusion, repair air leak.  He is s/p right VATS for drainage of pleural effusion and decortication on 12/18/12, bronchoscopy and placement of endobronchial valves RLL 01/09/13, and talc pleurodesis 02/05/13.   Other history includes ESRD on HD (MWF, [place]), polysubstance abuse, CHF, anemia, HTN, PNA, secondary hyperparathyroidism, chronic right pleural effusion, hospitalization for GI bleed with diverticulitis 12/11/2012.  He was evaluated by Cardiologist Dr. Sallyanne Kuster [facility]) during his 01/06/13 admission to evaluate for possible endocarditis (TEE was negative for endocarditis).  He was also evaluated by Vascular Surgeon Dr. Bridgett Larsson on 01/13/13 for finding of chronically calcified fibrin sheath and/or chronically calcified clot in the right IF from a previous HD catheter.  Because the area was calcifed, he did not recommend dilation but rather herparin or coumadin for 3-6 months then VVS follow-up to consider repeat chest CT.  Nephrologist is Dr. Bernadene Person.  TEE on 01/16/13 showed: - Left ventricle: There was mild concentric hypertrophy. Systolic function was normal. The estimated ejection fraction was in the range of 60% to 65%. Wall motion wasnormal; there were no regional wall motion abnormalities. - Aortic valve: No evidence of vegetation. - Mitral valve: No evidence of vegetation. - Left atrium: No evidence of thrombus in the atrial cavity or appendage. No spontaneous echo contrast was observed. - Right atrium: No evidence of thrombus in the atrial cavity or appendage. - Atrial septum: No defect or patent foramen ovale was identified. - Tricuspid valve: No evidence of vegetation. - Pulmonic valve: No evidence of vegetation. Impressions: No evidence of endocarditis.  EKG on 02/17/13 showed NSR, non-specific T wave abnormality.  CXR on 02/17/13  showed stable postoperative change in the right hemithorax with chest tube in place.  Preoperative labs noted.  Critical hgb of 6.8 already called to [facility] yesterday.  Dr. Roxan Hockey has ordered for a T&C for 2 Units.  He is scheduled for an ISTAT and PT/PTT on arrival tomorrow.  A decision for transfusion will be made at that time.  It's unclear if he was receiving any treatment for his anemia at hemodialysis today.  Patient reported he was told to hold his Coumadin starting 02/14/13.  He could not provide a urine specimen at his PAT visit.    Further recommendations based on follow-up lab results.  He will be evaluated by his assigned anesthesiologist on the day of surgery.   George Hugh [facility]/Anesthesiology Phone [phone] 02/18/2013 2:42 PM

## 2013-02-19 ENCOUNTER — Inpatient Hospital Stay (HOSPITAL_COMMUNITY): Payer: Medicare Other

## 2013-02-19 ENCOUNTER — Encounter (HOSPITAL_COMMUNITY): Payer: Self-pay | Admitting: *Deleted

## 2013-02-19 ENCOUNTER — Inpatient Hospital Stay (HOSPITAL_COMMUNITY)
Admission: RE | Admit: 2013-02-19 | Discharge: 2013-03-06 | DRG: 163 | Disposition: A | Discharge status: Home / Self Care | Payer: Medicare Other | Source: Ambulatory Visit | Attending: Thoracic Surgery (Cardiothoracic Vascular Surgery) | Admitting: Thoracic Surgery (Cardiothoracic Vascular Surgery)

## 2013-02-19 ENCOUNTER — Encounter (HOSPITAL_COMMUNITY): Payer: Self-pay | Admitting: Vascular Surgery

## 2013-02-19 ENCOUNTER — Encounter (HOSPITAL_COMMUNITY)
Admission: RE | Disposition: A | Discharge status: Home / Self Care | Payer: Self-pay | Source: Ambulatory Visit | Attending: Thoracic Surgery (Cardiothoracic Vascular Surgery)

## 2013-02-19 ENCOUNTER — Inpatient Hospital Stay (HOSPITAL_COMMUNITY): Payer: Medicare Other | Admitting: Anesthesiology

## 2013-02-19 DIAGNOSIS — J90 Pleural effusion, not elsewhere classified: Secondary | ICD-10-CM

## 2013-02-19 DIAGNOSIS — J95812 Postprocedural air leak: Principal | ICD-10-CM | POA: Diagnosis present

## 2013-02-19 DIAGNOSIS — Z01818 Encounter for other preprocedural examination: Secondary | ICD-10-CM

## 2013-02-19 DIAGNOSIS — D638 Anemia in other chronic diseases classified elsewhere: Secondary | ICD-10-CM | POA: Diagnosis present

## 2013-02-19 DIAGNOSIS — J860 Pyothorax with fistula: Secondary | ICD-10-CM | POA: Diagnosis present

## 2013-02-19 DIAGNOSIS — J9382 Other air leak: Secondary | ICD-10-CM

## 2013-02-19 DIAGNOSIS — Z7901 Long term (current) use of anticoagulants: Secondary | ICD-10-CM

## 2013-02-19 DIAGNOSIS — Z01812 Encounter for preprocedural laboratory examination: Secondary | ICD-10-CM

## 2013-02-19 DIAGNOSIS — N186 End stage renal disease: Secondary | ICD-10-CM | POA: Diagnosis present

## 2013-02-19 DIAGNOSIS — Z79899 Other long term (current) drug therapy: Secondary | ICD-10-CM

## 2013-02-19 DIAGNOSIS — Z01810 Encounter for preprocedural cardiovascular examination: Secondary | ICD-10-CM

## 2013-02-19 DIAGNOSIS — D62 Acute posthemorrhagic anemia: Secondary | ICD-10-CM | POA: Diagnosis present

## 2013-02-19 DIAGNOSIS — B965 Pseudomonas (aeruginosa) (mallei) (pseudomallei) as the cause of diseases classified elsewhere: Secondary | ICD-10-CM | POA: Diagnosis present

## 2013-02-19 DIAGNOSIS — J930 Spontaneous tension pneumothorax: Secondary | ICD-10-CM

## 2013-02-19 DIAGNOSIS — Z9889 Other specified postprocedural states: Secondary | ICD-10-CM

## 2013-02-19 DIAGNOSIS — E875 Hyperkalemia: Secondary | ICD-10-CM | POA: Diagnosis not present

## 2013-02-19 DIAGNOSIS — Z87891 Personal history of nicotine dependence: Secondary | ICD-10-CM

## 2013-02-19 DIAGNOSIS — I120 Hypertensive chronic kidney disease with stage 5 chronic kidney disease or end stage renal disease: Secondary | ICD-10-CM | POA: Diagnosis present

## 2013-02-19 DIAGNOSIS — N2581 Secondary hyperparathyroidism of renal origin: Secondary | ICD-10-CM | POA: Diagnosis present

## 2013-02-19 DIAGNOSIS — I498 Other specified cardiac arrhythmias: Secondary | ICD-10-CM | POA: Diagnosis not present

## 2013-02-19 HISTORY — PX: VIDEO ASSISTED THORACOSCOPY (VATS)/THOROCOTOMY: SHX6173

## 2013-02-19 HISTORY — PX: VIDEO BRONCHOSCOPY WITH INSERTION OF INTERBRONCHIAL VALVE (IBV): SHX6178

## 2013-02-19 LAB — PROTIME-INR
INR: 1.27 (ref 0.00–1.49)
Prothrombin Time: 15.6 seconds — ABNORMAL HIGH (ref 11.6–15.2)

## 2013-02-19 LAB — CBC
HCT: 23.8 % — ABNORMAL LOW (ref 39.0–52.0)
MCH: 28.8 pg (ref 26.0–34.0)
MCHC: 33.2 g/dL (ref 30.0–36.0)
MCV: 86.9 fL (ref 78.0–100.0)
Platelets: 205 10*3/uL (ref 150–400)
RDW: 16.8 % — ABNORMAL HIGH (ref 11.5–15.5)
WBC: 8.2 10*3/uL (ref 4.0–10.5)

## 2013-02-19 LAB — POCT I-STAT 4, (NA,K, GLUC, HGB,HCT)
Glucose, Bld: 100 mg/dL — ABNORMAL HIGH (ref 70–99)
HCT: 25 % — ABNORMAL LOW (ref 39.0–52.0)
Hemoglobin: 5.8 g/dL — CL (ref 13.0–17.0)
Hemoglobin: 6.5 g/dL — CL (ref 13.0–17.0)
Potassium: 4.4 mEq/L (ref 3.5–5.1)
Potassium: 4.5 mEq/L (ref 3.5–5.1)
Sodium: 138 mEq/L (ref 135–145)
Sodium: 139 mEq/L (ref 135–145)
Sodium: 138 mEq/L (ref 135–145)

## 2013-02-19 LAB — PREPARE RBC (CROSSMATCH)

## 2013-02-19 SURGERY — BRONCHOSCOPY, FLEXIBLE, WITH INTRABRONCHIAL VALVE INSERTION
Anesthesia: General | Site: Chest | Laterality: Right | Wound class: Clean Contaminated

## 2013-02-19 MED ORDER — OXYCODONE-ACETAMINOPHEN 5-325 MG PO TABS
1.0000 | ORAL_TABLET | ORAL | Status: DC | PRN
  Administered 2013-02-20: 2 via ORAL
  Administered 2013-02-20: 1 via ORAL
  Administered 2013-02-21 – 2013-03-06 (×61): 2 via ORAL
  Filled 2013-02-19 (×2): qty 2
  Filled 2013-02-19: qty 1
  Filled 2013-02-19 (×60): qty 2

## 2013-02-19 MED ORDER — HEMOSTATIC AGENTS (NO CHARGE) OPTIME
TOPICAL | Status: DC | PRN
  Administered 2013-02-19: 1 via TOPICAL

## 2013-02-19 MED ORDER — BISACODYL 5 MG PO TBEC
10.0000 mg | DELAYED_RELEASE_TABLET | Freq: Every day | ORAL | Status: DC
  Administered 2013-02-20: 10 mg via ORAL
  Filled 2013-02-19 (×2): qty 2

## 2013-02-19 MED ORDER — DIPHENHYDRAMINE HCL 12.5 MG/5ML PO ELIX
12.5000 mg | ORAL_SOLUTION | Freq: Four times a day (QID) | ORAL | Status: DC | PRN
  Filled 2013-02-19: qty 5

## 2013-02-19 MED ORDER — 0.9 % SODIUM CHLORIDE (POUR BTL) OPTIME
TOPICAL | Status: DC | PRN
  Administered 2013-02-19: 2000 mL
  Administered 2013-02-19: 1000 mL

## 2013-02-19 MED ORDER — CEFUROXIME SODIUM 750 MG IJ SOLR
750.0000 mg | Freq: Once | INTRAMUSCULAR | Status: DC
  Filled 2013-02-19: qty 750

## 2013-02-19 MED ORDER — DEXTROSE 5 % IV SOLN
1.5000 g | Freq: Two times a day (BID) | INTRAVENOUS | Status: AC
  Administered 2013-02-19 – 2013-02-20 (×2): 1.5 g via INTRAVENOUS
  Filled 2013-02-19 (×2): qty 1.5

## 2013-02-19 MED ORDER — ONDANSETRON HCL 4 MG/2ML IJ SOLN: 4.0000 mg | Freq: Once | INTRAMUSCULAR | Status: DC | PRN

## 2013-02-19 MED ORDER — FENTANYL CITRATE 0.05 MG/ML IJ SOLN
INTRAMUSCULAR | Status: DC | PRN
  Administered 2013-02-19 (×4): 50 ug via INTRAVENOUS
  Administered 2013-02-19 (×2): 150 ug via INTRAVENOUS
  Administered 2013-02-19 (×3): 50 ug via INTRAVENOUS

## 2013-02-19 MED ORDER — CALCIUM ACETATE 667 MG PO CAPS
2668.0000 mg | ORAL_CAPSULE | Freq: Three times a day (TID) | ORAL | Status: DC
  Administered 2013-02-20 – 2013-02-21 (×2): 2668 mg via ORAL
  Administered 2013-02-22: 1334 mg via ORAL
  Administered 2013-02-23: 2668 mg via ORAL
  Administered 2013-02-24: 1334 mg via ORAL
  Administered 2013-02-24: 2668 mg via ORAL
  Administered 2013-02-24: 2001 mg via ORAL
  Administered 2013-02-25: 2668 mg via ORAL
  Filled 2013-02-19 (×19): qty 4

## 2013-02-19 MED ORDER — NALOXONE HCL 0.4 MG/ML IJ SOLN: 0.4000 mg | INTRAMUSCULAR | Status: DC | PRN

## 2013-02-19 MED ORDER — DOXERCALCIFEROL 4 MCG/2ML IV SOLN
4.5000 ug | INTRAVENOUS | Status: DC
  Filled 2013-02-19: qty 4

## 2013-02-19 MED ORDER — PROPOFOL 10 MG/ML IV BOLUS
INTRAVENOUS | Status: DC | PRN
  Administered 2013-02-19: 200 mg via INTRAVENOUS

## 2013-02-19 MED ORDER — CALCIUM ACETATE 667 MG PO CAPS
2001.0000 mg | ORAL_CAPSULE | ORAL | Status: DC
  Administered 2013-02-20 – 2013-02-21 (×2): 2001 mg via ORAL
  Administered 2013-02-21 – 2013-02-22 (×3): 1334 mg via ORAL
  Administered 2013-02-23 (×3): 2001 mg via ORAL
  Filled 2013-02-19 (×18): qty 3

## 2013-02-19 MED ORDER — PHENYLEPHRINE HCL 10 MG/ML IJ SOLN
10.0000 mg | INTRAVENOUS | Status: DC | PRN
  Administered 2013-02-19: 10 ug/min via INTRAVENOUS

## 2013-02-19 MED ORDER — BIOTENE DRY MOUTH MT LIQD
15.0000 mL | Freq: Two times a day (BID) | OROMUCOSAL | Status: DC
  Administered 2013-02-21 – 2013-02-24 (×6): 15 mL via OROMUCOSAL

## 2013-02-19 MED ORDER — METOPROLOL TARTRATE 25 MG PO TABS
25.0000 mg | ORAL_TABLET | Freq: Two times a day (BID) | ORAL | Status: DC
  Filled 2013-02-19 (×3): qty 1

## 2013-02-19 MED ORDER — DEXTROSE 5 % IV SOLN
750.0000 mg | Freq: Once | INTRAVENOUS | Status: AC
  Administered 2013-02-19: .75 g via INTRAVENOUS
  Filled 2013-02-19: qty 750

## 2013-02-19 MED ORDER — PROPRANOLOL HCL 1 MG/ML IV SOLN
INTRAVENOUS | Status: DC | PRN
  Administered 2013-02-19 (×2): .25 mg via INTRAVENOUS

## 2013-02-19 MED ORDER — ONDANSETRON HCL 4 MG/2ML IJ SOLN: 4.0000 mg | Freq: Four times a day (QID) | INTRAMUSCULAR | Status: DC | PRN

## 2013-02-19 MED ORDER — FENTANYL 10 MCG/ML IV SOLN
INTRAVENOUS | Status: DC
  Administered 2013-02-19: 19:00:00 via INTRAVENOUS
  Administered 2013-02-20: 120 ug via INTRAVENOUS
  Administered 2013-02-20: 50 ug via INTRAVENOUS
  Administered 2013-02-20: 110 ug via INTRAVENOUS
  Administered 2013-02-20: 20 ug via INTRAVENOUS
  Administered 2013-02-20: 140 ug via INTRAVENOUS
  Administered 2013-02-20: 19:00:00 via INTRAVENOUS
  Administered 2013-02-21: 140 ug via INTRAVENOUS
  Administered 2013-02-21: 160 ug via INTRAVENOUS
  Administered 2013-02-21: 110 ug via INTRAVENOUS
  Administered 2013-02-21: 30 ug via INTRAVENOUS
  Administered 2013-02-21: 150 ug via INTRAVENOUS
  Administered 2013-02-21: 50 ug via INTRAVENOUS
  Administered 2013-02-22: 80 ug via INTRAVENOUS
  Administered 2013-02-22: 230 ug via INTRAVENOUS
  Administered 2013-02-22: 20 ug via INTRAVENOUS
  Administered 2013-02-22: 80 ug via INTRAVENOUS
  Administered 2013-02-22: 180 ug via INTRAVENOUS
  Administered 2013-02-22: 80 ug via INTRAVENOUS
  Administered 2013-02-23: 20 ug via INTRAVENOUS
  Administered 2013-02-23: 129.7 ug via INTRAVENOUS
  Administered 2013-02-23: 60 ug via INTRAVENOUS
  Administered 2013-02-23: via INTRAVENOUS
  Filled 2013-02-19 (×7): qty 50

## 2013-02-19 MED ORDER — LACTATED RINGERS IV SOLN
INTRAVENOUS | Status: DC
  Administered 2013-02-19: 1 mL via INTRAVENOUS

## 2013-02-19 MED ORDER — POTASSIUM CHLORIDE 10 MEQ/50ML IV SOLN: 10.0000 meq | Freq: Every day | INTRAVENOUS | Status: DC | PRN

## 2013-02-19 MED ORDER — LIDOCAINE HCL (CARDIAC) 20 MG/ML IV SOLN
INTRAVENOUS | Status: DC | PRN
  Administered 2013-02-19: 100 mg via INTRAVENOUS

## 2013-02-19 MED ORDER — VANCOMYCIN HCL IN DEXTROSE 1-5 GM/200ML-% IV SOLN
1000.0000 mg | Freq: Two times a day (BID) | INTRAVENOUS | Status: AC
  Administered 2013-02-19: 1000 mg via INTRAVENOUS
  Filled 2013-02-19: qty 200

## 2013-02-19 MED ORDER — SODIUM CHLORIDE 0.9 % IV SOLN
INTRAVENOUS | Status: DC | PRN
  Administered 2013-02-19 (×5): via INTRAVENOUS

## 2013-02-19 MED ORDER — CHLORHEXIDINE GLUCONATE 0.12 % MT SOLN
15.0000 mL | Freq: Two times a day (BID) | OROMUCOSAL | Status: DC
  Administered 2013-02-20 – 2013-02-27 (×10): 15 mL via OROMUCOSAL
  Filled 2013-02-19 (×16): qty 15

## 2013-02-19 MED ORDER — SENNOSIDES-DOCUSATE SODIUM 8.6-50 MG PO TABS
1.0000 | ORAL_TABLET | Freq: Every evening | ORAL | Status: DC | PRN
  Filled 2013-02-19: qty 1

## 2013-02-19 MED ORDER — PHENYLEPHRINE HCL 10 MG/ML IJ SOLN
INTRAMUSCULAR | Status: DC | PRN
  Administered 2013-02-19: 40 ug via INTRAVENOUS

## 2013-02-19 MED ORDER — ONDANSETRON HCL 4 MG/2ML IJ SOLN
4.0000 mg | Freq: Four times a day (QID) | INTRAMUSCULAR | Status: DC | PRN
  Administered 2013-03-05: 4 mg via INTRAVENOUS
  Filled 2013-02-19: qty 2

## 2013-02-19 MED ORDER — SODIUM CHLORIDE 0.45 % IV SOLN
INTRAVENOUS | Status: DC
  Administered 2013-02-19 – 2013-02-20 (×2): via INTRAVENOUS

## 2013-02-19 MED ORDER — LABETALOL HCL 5 MG/ML IV SOLN
INTRAVENOUS | Status: DC | PRN
  Administered 2013-02-19 (×5): 5 mg via INTRAVENOUS

## 2013-02-19 MED ORDER — ALBUMIN HUMAN 5 % IV SOLN
INTRAVENOUS | Status: DC | PRN
  Administered 2013-02-19: 13:00:00 via INTRAVENOUS

## 2013-02-19 MED ORDER — OXYCODONE HCL 5 MG PO TABS
5.0000 mg | ORAL_TABLET | ORAL | Status: AC | PRN
  Administered 2013-02-19 – 2013-02-20 (×4): 5 mg via ORAL
  Filled 2013-02-19 (×4): qty 1

## 2013-02-19 MED ORDER — NEOSTIGMINE METHYLSULFATE 1 MG/ML IJ SOLN
INTRAMUSCULAR | Status: DC | PRN
  Administered 2013-02-19: 5 mg via INTRAVENOUS

## 2013-02-19 MED ORDER — ONDANSETRON HCL 4 MG/2ML IJ SOLN
INTRAMUSCULAR | Status: DC | PRN
  Administered 2013-02-19: 4 mg via INTRAVENOUS

## 2013-02-19 MED ORDER — VECURONIUM BROMIDE 10 MG IV SOLR
INTRAVENOUS | Status: DC | PRN
  Administered 2013-02-19: 3 mg via INTRAVENOUS
  Administered 2013-02-19 (×2): 2 mg via INTRAVENOUS
  Administered 2013-02-19 (×2): 1 mg via INTRAVENOUS
  Administered 2013-02-19 (×2): 2 mg via INTRAVENOUS
  Administered 2013-02-19: 3 mg via INTRAVENOUS
  Administered 2013-02-19: 2 mg via INTRAVENOUS

## 2013-02-19 MED ORDER — HYDROMORPHONE HCL PF 1 MG/ML IJ SOLN
0.2500 mg | INTRAMUSCULAR | Status: DC | PRN
  Administered 2013-02-19 (×2): 0.5 mg via INTRAVENOUS

## 2013-02-19 MED ORDER — LIDOCAINE HCL 4 % MT SOLN
OROMUCOSAL | Status: DC | PRN
  Administered 2013-02-19: 4 mL via TOPICAL

## 2013-02-19 MED ORDER — DIPHENHYDRAMINE HCL 50 MG/ML IJ SOLN: 12.5000 mg | Freq: Four times a day (QID) | INTRAMUSCULAR | Status: DC | PRN

## 2013-02-19 MED ORDER — SODIUM CHLORIDE 0.9 % IJ SOLN
9.0000 mL | INTRAMUSCULAR | Status: DC | PRN
  Administered 2013-02-21: 10 mL via INTRAVENOUS
  Administered 2013-02-22: 20 mL via INTRAVENOUS

## 2013-02-19 MED ORDER — RENA-VITE PO TABS
1.0000 | ORAL_TABLET | Freq: Every day | ORAL | Status: DC
  Administered 2013-02-20 – 2013-03-05 (×14): 1 via ORAL
  Filled 2013-02-19 (×16): qty 1

## 2013-02-19 MED ORDER — ACETAMINOPHEN 10 MG/ML IV SOLN
1000.0000 mg | Freq: Four times a day (QID) | INTRAVENOUS | Status: AC
  Administered 2013-02-19 – 2013-02-20 (×4): 1000 mg via INTRAVENOUS
  Filled 2013-02-19 (×3): qty 100

## 2013-02-19 MED ORDER — HEMOSTATIC AGENTS (NO CHARGE) OPTIME
TOPICAL | Status: DC | PRN
  Administered 2013-02-19: 3 via TOPICAL

## 2013-02-19 MED ORDER — ROCURONIUM BROMIDE 100 MG/10ML IV SOLN
INTRAVENOUS | Status: DC | PRN
  Administered 2013-02-19: 50 mg via INTRAVENOUS

## 2013-02-19 MED ORDER — GLYCOPYRROLATE 0.2 MG/ML IJ SOLN
INTRAMUSCULAR | Status: DC | PRN
  Administered 2013-02-19: .8 mg via INTRAVENOUS

## 2013-02-19 MED ORDER — LEVALBUTEROL HCL 0.63 MG/3ML IN NEBU
0.6300 mg | INHALATION_SOLUTION | Freq: Four times a day (QID) | RESPIRATORY_TRACT | Status: DC
  Administered 2013-02-20 – 2013-02-22 (×9): 0.63 mg via RESPIRATORY_TRACT
  Filled 2013-02-19 (×17): qty 3

## 2013-02-19 SURGICAL SUPPLY — 91 items
APPLIER CLIP ROT 10 11.4 M/L (STAPLE)
APR CLP MED LRG 11.4X10 (STAPLE)
BAG SPEC RTRVL LRG 6X4 10 (ENDOMECHANICALS)
CANISTER SUCTION 2500CC (MISCELLANEOUS) ×6 IMPLANT
CATH KIT ON Q 5IN SLV (PAIN MANAGEMENT) IMPLANT
CATH THORACIC 28FR (CATHETERS) IMPLANT
CATH THORACIC 28FR RT ANG (CATHETERS) ×2 IMPLANT
CATH THORACIC 36FR (CATHETERS) IMPLANT
CATH THORACIC 36FR RT ANG (CATHETERS) IMPLANT
CLIP APPLIE ROT 10 11.4 M/L (STAPLE) IMPLANT
CLIP TI MEDIUM 24 (CLIP) ×1 IMPLANT
CLIP TI MEDIUM 6 (CLIP) ×3 IMPLANT
CLOTH BEACON ORANGE TIMEOUT ST (SAFETY) ×3 IMPLANT
CONN ST 1/4X3/8  BEN (MISCELLANEOUS) ×2
CONN ST 1/4X3/8 BEN (MISCELLANEOUS) IMPLANT
CONN Y 3/8X3/8X3/8  BEN (MISCELLANEOUS) ×2
CONN Y 3/8X3/8X3/8 BEN (MISCELLANEOUS) ×2 IMPLANT
CONT SPEC 4OZ CLIKSEAL STRL BL (MISCELLANEOUS) ×6 IMPLANT
COVER TABLE BACK 60X90 (DRAPES) ×3 IMPLANT
DRAIN CHANNEL 28F RND 3/8 FF (WOUND CARE) ×1 IMPLANT
DRAIN CHANNEL 32F RND 10.7 FF (WOUND CARE) ×1 IMPLANT
DRAPE LAPAROSCOPIC ABDOMINAL (DRAPES) ×3 IMPLANT
DRAPE PROXIMA HALF (DRAPES) ×1 IMPLANT
DRAPE WARM FLUID 44X44 (DRAPE) ×5 IMPLANT
ELECT REM PT RETURN 9FT ADLT (ELECTROSURGICAL) ×3
ELECTRODE REM PT RTRN 9FT ADLT (ELECTROSURGICAL) ×2 IMPLANT
FILTER STRAW FLUID ASPIR (MISCELLANEOUS) IMPLANT
FORCEPS BIOP RJ4 1.8 (CUTTING FORCEPS) ×1 IMPLANT
GLOVE BIO SURGEON STRL SZ 6 (GLOVE) ×3 IMPLANT
GLOVE BIO SURGEON STRL SZ 6.5 (GLOVE) ×1 IMPLANT
GLOVE BIOGEL PI IND STRL 6.5 (GLOVE) IMPLANT
GLOVE BIOGEL PI INDICATOR 6.5 (GLOVE) ×5
GLOVE ECLIPSE 6.5 STRL STRAW (GLOVE) ×3 IMPLANT
GLOVE EUDERMIC 7 POWDERFREE (GLOVE) ×8 IMPLANT
GOWN BRE IMP SLV AUR LG STRL (GOWN DISPOSABLE) ×5 IMPLANT
GOWN PREVENTION PLUS XLARGE (GOWN DISPOSABLE) ×6 IMPLANT
GOWN STRL NON-REIN LRG LVL3 (GOWN DISPOSABLE) ×6 IMPLANT
HEMOSTAT SURGICEL 2X14 (HEMOSTASIS) ×1 IMPLANT
KIT BASIN OR (CUSTOM PROCEDURE TRAY) ×3 IMPLANT
KIT ROOM TURNOVER OR (KITS) ×3 IMPLANT
KIT SUCTION CATH 14FR (SUCTIONS) ×3 IMPLANT
MARKER SKIN DUAL TIP RULER LAB (MISCELLANEOUS) ×3 IMPLANT
NS IRRIG 1000ML POUR BTL (IV SOLUTION) ×7 IMPLANT
OIL SILICONE PENTAX (PARTS (SERVICE/REPAIRS)) ×1 IMPLANT
PACK CHEST (CUSTOM PROCEDURE TRAY) ×3 IMPLANT
PAD ARMBOARD 7.5X6 YLW CONV (MISCELLANEOUS) ×8 IMPLANT
POUCH ENDO CATCH II 15MM (MISCELLANEOUS) IMPLANT
POUCH SPECIMEN RETRIEVAL 10MM (ENDOMECHANICALS) IMPLANT
SEALANT PROGEL (MISCELLANEOUS) ×3 IMPLANT
SEALANT SURG COSEAL 4ML (VASCULAR PRODUCTS) IMPLANT
SEALANT SURG COSEAL 8ML (VASCULAR PRODUCTS) IMPLANT
SOLUTION ANTI FOG 6CC (MISCELLANEOUS) ×5 IMPLANT
SPECIMEN JAR MEDIUM (MISCELLANEOUS) ×3 IMPLANT
SPONGE GAUZE 4X4 12PLY (GAUZE/BANDAGES/DRESSINGS) ×4 IMPLANT
SPONGE INTESTINAL PEANUT (DISPOSABLE) ×3 IMPLANT
SPONGE LAP 18X18 X RAY DECT (DISPOSABLE) ×3 IMPLANT
STAPLER SKIN PROX WIDE 3.9 (STAPLE) ×1 IMPLANT
STOPCOCK MORSE 400PSI 3WAY (MISCELLANEOUS) ×2 IMPLANT
SUT PROLENE 4 0 RB 1 (SUTURE)
SUT PROLENE 4-0 RB1 .5 CRCL 36 (SUTURE) IMPLANT
SUT SILK  1 MH (SUTURE) ×2
SUT SILK 1 MH (SUTURE) ×4 IMPLANT
SUT SILK 2 0SH CR/8 30 (SUTURE) ×1 IMPLANT
SUT SILK 3 0SH CR/8 30 (SUTURE) IMPLANT
SUT VIC AB 1 CTX 36 (SUTURE) ×6
SUT VIC AB 1 CTX36XBRD ANBCTR (SUTURE) IMPLANT
SUT VIC AB 2-0 CTX 36 (SUTURE) ×2 IMPLANT
SUT VIC AB 2-0 UR6 27 (SUTURE) ×1 IMPLANT
SUT VIC AB 3-0 MH 27 (SUTURE) IMPLANT
SUT VIC AB 3-0 SH 27 (SUTURE) ×15
SUT VIC AB 3-0 SH 27X BRD (SUTURE) IMPLANT
SUT VIC AB 3-0 SH 8-18 (SUTURE) ×1 IMPLANT
SUT VIC AB 3-0 X1 27 (SUTURE) ×5 IMPLANT
SUT VICRYL 2 TP 1 (SUTURE) ×2 IMPLANT
SWAB COLLECTION DEVICE MRSA (MISCELLANEOUS) IMPLANT
SYR 20ML ECCENTRIC (SYRINGE) ×3 IMPLANT
SYRINGE 10CC LL (SYRINGE) ×2 IMPLANT
SYSTEM SAHARA CHEST DRAIN ATS (WOUND CARE) ×3 IMPLANT
TAPE CLOTH 4X10 WHT NS (GAUZE/BANDAGES/DRESSINGS) ×3 IMPLANT
TAPE CLOTH SURG 4X10 WHT LF (GAUZE/BANDAGES/DRESSINGS) ×2 IMPLANT
TIP APPLICATOR SPRAY EXTEND 16 (VASCULAR PRODUCTS) ×4 IMPLANT
TOWEL NATURAL 4PK STERILE (DISPOSABLE) ×2 IMPLANT
TOWEL OR 17X24 6PK STRL BLUE (TOWEL DISPOSABLE) ×3 IMPLANT
TOWEL OR 17X26 10 PK STRL BLUE (TOWEL DISPOSABLE) ×3 IMPLANT
TRAP SPECIMEN MUCOUS 40CC (MISCELLANEOUS) ×3 IMPLANT
TRAY FOLEY CATH 14FRSI W/METER (CATHETERS) ×3 IMPLANT
TUBE ANAEROBIC SPECIMEN COL (MISCELLANEOUS) IMPLANT
TUBE CONNECTING 12X1/4 (SUCTIONS) ×7 IMPLANT
TUNNELER SHEATH ON-Q 16GX12 DP (PAIN MANAGEMENT) IMPLANT
WATER STERILE IRR 1000ML POUR (IV SOLUTION) ×6 IMPLANT
YANKAUER SUCT BULB TIP NO VENT (SUCTIONS) ×3 IMPLANT

## 2013-02-19 NOTE — OR Nursing (Signed)
Procedure 1: Removal of IBV  Start @ [id number], End @ [id number] Procedure 2: Right VATS  Start@ [id number]

## 2013-02-19 NOTE — Anesthesia Preprocedure Evaluation (Signed)
Anesthesia Evaluation  Patient identified by MRN, date of birth, ID band Patient awake    Reviewed: Allergy & Precautions, H&P , NPO status , Patient's Chart, lab work & pertinent test results  Airway Mallampati: II TM Distance: >3 FB Neck ROM: full    Dental   Pulmonary          Cardiovascular hypertension, +CHF Rhythm:regular Rate:Normal     Neuro/Psych    GI/Hepatic   Endo/Other    Renal/GU ESRF and DialysisRenal disease     Musculoskeletal   Abdominal   Peds  Hematology   Anesthesia Other Findings Polysubstance Abuse  Reproductive/Obstetrics                           Anesthesia Physical Anesthesia Plan  ASA: III  Anesthesia Plan: General   Post-op Pain Management:    Induction: Intravenous  Airway Management Planned: Double Lumen EBT and Oral ETT  Additional Equipment: Arterial line and CVP  Intra-op Plan:   Post-operative Plan: Extubation in OR and Possible Post-op intubation/ventilation  Informed Consent: I have reviewed the patients History and Physical, chart, labs and discussed the procedure including the risks, benefits and alternatives for the proposed anesthesia with the patient or authorized representative who has indicated his/her understanding and acceptance.     Plan Discussed with: CRNA, Anesthesiologist and Surgeon  Anesthesia Plan Comments:         Anesthesia Quick Evaluation

## 2013-02-19 NOTE — Progress Notes (Signed)
chest xray done at bedsideread by surgeon Dr. Blase Mess

## 2013-02-19 NOTE — Progress Notes (Signed)
report received from [facility] ,pt has right AVF able to ascultate and palpate bruit and thrill

## 2013-02-19 NOTE — Anesthesia Postprocedure Evaluation (Signed)
  Anesthesia Post-op Note  Patient: [name] [name]  Procedure(s) Performed: Procedure(s) with comments: VIDEO BRONCHOSCOPY WITH INSERTION OF INTERBRONCHIAL VALVE (IBV) (Right) - removal of two interbronchial valves right chest VIDEO ASSISTED THORACOSCOPY (VATS)/THOROCOTOMY (Right) - Right Video Assisted Thoracoscopy, Right Thoracotomy, Decortication,   Patient Location: PACU  Anesthesia Type:General  Level of Consciousness: awake  Airway and Oxygen Therapy: Patient Spontanous Breathing  Post-op Pain: mild  Post-op Assessment: Post-op Vital signs reviewed, Patient's Cardiovascular Status Stable, Respiratory Function Stable, Patent Airway, No signs of Nausea or vomiting and Pain level controlled  Post-op Vital Signs: stable  Complications: No apparent anesthesia complications

## 2013-02-19 NOTE — Anesthesia Procedure Notes (Addendum)
Procedure Name: Intubation Date/Time: 02/19/2013 10:48 AM Performed by: Maryland Pink Pre-anesthesia Checklist: Patient identified, Emergency Drugs available, Suction available, Patient being monitored and Timeout performed Patient Re-evaluated:Patient Re-evaluated prior to inductionOxygen Delivery Method: Circle system utilized Preoxygenation: Pre-oxygenation with 100% oxygen Intubation Type: IV induction Ventilation: Mask ventilation without difficulty and Oral airway inserted - appropriate to patient size Laryngoscope Size: Sabra Heck and 3 Grade View: Grade I Tube type: Oral Tube size: 8.5 mm Number of attempts: 1 Airway Equipment and Method: Stylet and LTA kit utilized Placement Confirmation: ETT inserted through vocal cords under direct vision,  positive ETCO2 and breath sounds checked- equal and bilateral Secured at: 23 cm Tube secured with: Tape Dental Injury: Teeth and Oropharynx as per pre-operative assessment    Procedure Name: Intubation Date/Time: 02/19/2013 11:15 AM Performed by: Maryland Pink Pre-anesthesia Checklist: Emergency Drugs available, Patient identified, Suction available, Patient being monitored and Timeout performed Patient Re-evaluated:Patient Re-evaluated prior to inductionOxygen Delivery Method: Circle system utilized Preoxygenation: Pre-oxygenation with 100% oxygen Intubation Type: Inhalational induction with existing ETT Laryngoscope Size: Miller and 3 Grade View: Grade I Endobronchial tube: Double lumen EBT, Left, EBT position confirmed by fiberoptic bronchoscope and EBT position confirmed by auscultation and 37 Fr Number of attempts: 1 Airway Equipment and Method: Stylet Placement Confirmation: ETT inserted through vocal cords under direct vision,  positive ETCO2 and breath sounds checked- equal and bilateral Tube secured with: Tape Dental Injury: Teeth and Oropharynx as per pre-operative assessment

## 2013-02-19 NOTE — Progress Notes (Signed)
No pneumothorax on chest xray.

## 2013-02-19 NOTE — Preoperative (Signed)
Beta Blockers   Reason not to administer Beta Blockers:Not Applicable 

## 2013-02-19 NOTE — H&P (View-Only) (Signed)
HPI:  [name] returns today for followup of his persistent air leak. He is a 52 year old gentleman who had a right thoracoscopic drainage of a chronic pleural effusion and decortication of the right lung back in January. Postoperatively this was complicated by prolonged air leak. We did place bronchial valves which decreased the air leak but never eliminated it completely. At his last office visit I recommended that we proceed with a redo right vats and possible thoracotomy, however he wanted to try Doppler these were more time. That was done last Thursday. He did not have any adverse effects from the talc pleurodesis. He still has pain at the chest tube site. He's not had any fevers or chills or sweats. He's not having any difficulty with his breathing.  Past Medical History  Diagnosis Date  . End-stage renal disease on hemodialysis   . Secondary hyperparathyroidism   . Hypertension   . Polysubstance abuse   . Anemia of chronic disease     BL Hgb ~10  . Tobacco abuse       Current Outpatient Prescriptions  Medication Sig Dispense Refill  . calcium acetate (PHOSLO) 667 MG capsule Take 2,668 mg by mouth 3 (three) times daily with meals.       . calcium acetate (PHOSLO) 667 MG capsule Take 2,001 mg by mouth with snacks.      Marland Kitchen doxercalciferol (HECTOROL) 4 MCG/2ML injection Inject 4.5 mcg into the vein every Monday, Wednesday, and Friday with hemodialysis.      Marland Kitchen metoprolol tartrate (LOPRESSOR) 25 MG tablet Take 25 mg by mouth 2 (two) times daily.      . multivitamin (RENA-VIT) TABS tablet Take 1 tablet by mouth at bedtime.      Marland Kitchen oxyCODONE-acetaminophen (PERCOCET/ROXICET) 5-325 MG per tablet Take 1-2 tablets by mouth every 4 (four) hours as needed.  80 tablet  0  . warfarin (COUMADIN) 2.5 MG tablet Take 1 tablet (2.5 mg total) by mouth every Monday, Wednesday, and Friday at 6 PM.  30 tablet  1  . warfarin (COUMADIN) 5 MG tablet Take 1 tablet (5 mg total) by mouth every Tuesday, Thursday,  Saturday, and Sunday at 6 PM.  30 tablet  1   No current facility-administered medications for this visit.    Physical Exam BP 139/82  Pulse 100  Resp 16  Ht 6\' 1"  (1.854 m)  Wt 208 lb 5.4 oz (94.5 kg)  BMI 27.49 kg/m2  SpO79 39% 52 year old male in no acute distress Chest tube in place with murky drainage (400 cc since last Thursday) positive air leak  Diagnostic Tests: Chest x-ray shows stable postoperative changes in the right chest  Impression: [name] [name] is a 52 year old gentleman with a persistent air leak after decortication for chronic right pleural effusion. This was a very difficult procedure with a dense fibrous peel on the lung. He has failed all conservative maneuvers to avoid reoperation for the air leak. However he continues to leak air.  I had a long discussion with [name] regarding the management of this air leak. I do think that at this point reoperation is a necessity. He understands that we will try to start with a vats type procedure, however, it is very likely will require thoracotomy. To control the bronchopleural fistula we may have to use pieces, sutures, and/or staplers. We also need to remove the endobronchial valves and will plan to do that at the beginning of the procedure. I discussed with him the indications, risks, benefits, and alternatives.  He understands there is no guarantee of success. He does understand the risk include but are not limited to death, MI, DVT, PE, bleeding, possible need for transfusion, infection, prolonged air leak, cardiac arrhythmias, as well as other unforeseeable complications. He understands and accepts the risk and agrees to proceed.  Plan: Bronchoscopy, removal of endobronchial valves, right VATS, possible thoracotomy, repair of bronchopleural fistula on Thursday, 02/19/2013. He will be admitted on the day of surgery.

## 2013-02-19 NOTE — Interval H&P Note (Signed)
History and Physical Interval Note:  02/19/2013 10:28 AM  [name] [name]  has presented today for surgery, with the diagnosis of pleural effusion; persistent air leak  The various methods of treatment have been discussed with the patient and family. After consideration of risks, benefits and other options for treatment, the patient has consented to  Procedure(s) with comments: VIDEO BRONCHOSCOPY WITH INSERTION OF INTERBRONCHIAL VALVE (IBV) (N/A) - removal of IBV VIDEO ASSISTED THORACOSCOPY (VATS)/THOROCOTOMY (Right) - VATS/possible thoracotomy for drainage of effusion, repair of air leak as a surgical intervention .  The patient's history has been reviewed, patient examined, no change in status, stable for surgery.  I have reviewed the patient's chart and labs.  Questions were answered to the patient's satisfaction.     HENDRICKSON,STEVEN C

## 2013-02-19 NOTE — Transfer of Care (Signed)
Immediate Anesthesia Transfer of Care Note  Patient: [name] [name]  Procedure(s) Performed: Procedure(s) with comments: VIDEO BRONCHOSCOPY WITH INSERTION OF INTERBRONCHIAL VALVE (IBV) (Right) - removal of two interbronchial valves right chest VIDEO ASSISTED THORACOSCOPY (VATS)/THOROCOTOMY (Right) - Right Video Assisted Thoracoscopy, Right Thoracotomy, Decortication,   Patient Location: PACU  Anesthesia Type:General  Level of Consciousness: awake, alert  and oriented  Airway & Oxygen Therapy: Patient Spontanous Breathing and Patient connected to face mask oxygen  Post-op Assessment: Report given to PACU RN, Post -op Vital signs reviewed and stable and Patient moving all extremities  Post vital signs: Reviewed and stable  Complications: No apparent anesthesia complications

## 2013-02-19 NOTE — Brief Op Note (Addendum)
02/19/2013  5:22 PM  PATIENT:  [name] [name]  52 y.o. male  PRE-OPERATIVE DIAGNOSIS:  pleural effusion; persistent air leak right chest  POST-OPERATIVE DIAGNOSIS:  pleural effusion; persistent air leak right chest  PROCEDURE:  Procedure(s) with comments:  VIDEO BRONCHOSCOPY WITH  (Right) - removal of two interbronchial valves right lower lobe segmental bronchi  RIGHT THORACOTOMY -Decortication of the lung -Repair of Air leak  SURGEON:  Surgeon(s) and Role:    * Melrose Nakayama, MD - Primary  PHYSICIAN ASSISTANT: Ellwood Handler PA-C  ANESTHESIA:   general  EBL:  Total I/O In: [id number] [I.V.:2300; Blood:1685; IV Piggyback:50] Out: 2500 [Blood:2500]  BLOOD ADMINISTERED:4 units PRBC    DRAINS: 32 and Alexander in the right chest  LOCAL MEDICATIONS USED:  NONE  SPECIMEN:  Source of Specimen:  Right Pleural Peel  DISPOSITION OF SPECIMEN:  Microbiology, Pathology  COUNTS:  YES  TOURNIQUET:  * No tourniquets in log *  DICTATION: .Dragon Dictation  PLAN OF CARE: Admit to inpatient   PATIENT DISPOSITION:  PACU - hemodynamically stable.   Delay start of Pharmacological VTE agent (>24hrs) due to surgical blood loss or risk of bleeding: yes  FINDINGS- bronchial valves removed without difficulty                   Multiloculated pleural effusion                   Dense scar and pleural peel with trapped right lung                   2 mm diameter BPF diaphragmatic surface of right lower lobe

## 2013-02-20 ENCOUNTER — Inpatient Hospital Stay (HOSPITAL_COMMUNITY): Payer: Medicare Other

## 2013-02-20 ENCOUNTER — Other Ambulatory Visit: Payer: Self-pay

## 2013-02-20 LAB — CBC
HCT: 23.5 % — ABNORMAL LOW (ref 39.0–52.0)
Hemoglobin: 8 g/dL — ABNORMAL LOW (ref 13.0–17.0)
MCH: 28.3 pg (ref 26.0–34.0)
MCH: 29.1 pg (ref 26.0–34.0)
MCHC: 32.4 g/dL (ref 30.0–36.0)
MCHC: 34 g/dL (ref 30.0–36.0)
MCV: 85.5 fL (ref 78.0–100.0)
Platelets: 238 10*3/uL (ref 150–400)
Platelets: 245 10*3/uL (ref 150–400)
RBC: 2.72 MIL/uL — ABNORMAL LOW (ref 4.22–5.81)
RBC: 2.75 MIL/uL — ABNORMAL LOW (ref 4.22–5.81)
RDW: 17.2 % — ABNORMAL HIGH (ref 11.5–15.5)
WBC: 15.6 10*3/uL — ABNORMAL HIGH (ref 4.0–10.5)

## 2013-02-20 LAB — PREPARE FRESH FROZEN PLASMA
Unit division: 0
Unit division: 0

## 2013-02-20 LAB — GLUCOSE, CAPILLARY: Glucose-Capillary: 86 mg/dL (ref 70–99)

## 2013-02-20 LAB — BASIC METABOLIC PANEL
Calcium: 9.2 mg/dL (ref 8.4–10.5)
GFR calc non Af Amer: 5 mL/min — ABNORMAL LOW (ref >90)
Potassium: 6.8 mEq/L (ref 3.5–5.1)
Sodium: 136 mEq/L (ref 135–145)

## 2013-02-20 LAB — POCT I-STAT 4, (NA,K, GLUC, HGB,HCT)
Glucose, Bld: 95 mg/dL (ref 70–99)
HCT: 21 % — ABNORMAL LOW (ref 39.0–52.0)
Hemoglobin: 7.1 g/dL — ABNORMAL LOW (ref 13.0–17.0)
Potassium: 6.5 mEq/L (ref 3.5–5.1)
Sodium: 135 mEq/L (ref 135–145)

## 2013-02-20 LAB — POCT I-STAT 3, ART BLOOD GAS (G3+)
pCO2 arterial: 46.6 mmHg — ABNORMAL HIGH (ref 35.0–45.0)
pH, Arterial: 7.377 (ref 7.350–7.450)
pO2, Arterial: 68 mmHg — ABNORMAL LOW (ref 80.0–100.0)

## 2013-02-20 LAB — HEPATITIS B SURFACE ANTIGEN: Hepatitis B Surface Ag: NEGATIVE

## 2013-02-20 MED ORDER — HEPARIN SODIUM (PORCINE) 1000 UNIT/ML DIALYSIS
1900.0000 [IU] | INTRAMUSCULAR | Status: DC | PRN
  Filled 2013-02-20: qty 2

## 2013-02-20 MED ORDER — DARBEPOETIN ALFA-POLYSORBATE 100 MCG/0.5ML IJ SOLN
100.0000 ug | INTRAMUSCULAR | Status: DC
  Administered 2013-02-20: 100 ug via INTRAVENOUS
  Filled 2013-02-20: qty 0.5

## 2013-02-20 MED ORDER — SODIUM CHLORIDE 0.9 % IV SOLN: 100.0000 mL | INTRAVENOUS | Status: DC | PRN

## 2013-02-20 MED ORDER — DOXERCALCIFEROL 4 MCG/2ML IV SOLN
INTRAVENOUS | Status: AC
  Filled 2013-02-20: qty 4

## 2013-02-20 MED ORDER — LIDOCAINE-PRILOCAINE 2.5-2.5 % EX CREA: 1.0000 "application " | TOPICAL_CREAM | CUTANEOUS | Status: DC | PRN

## 2013-02-20 MED ORDER — ALTEPLASE 2 MG IJ SOLR: 2.0000 mg | Freq: Once | INTRAMUSCULAR | Status: AC | PRN

## 2013-02-20 MED ORDER — NEPRO/CARBSTEADY PO LIQD
237.0000 mL | ORAL | Status: DC | PRN
  Filled 2013-02-20: qty 237

## 2013-02-20 MED ORDER — HEPARIN SODIUM (PORCINE) 1000 UNIT/ML DIALYSIS
1000.0000 [IU] | INTRAMUSCULAR | Status: DC | PRN
  Filled 2013-02-20: qty 1

## 2013-02-20 MED ORDER — PENTAFLUOROPROP-TETRAFLUOROETH EX AERO: 1.0000 "application " | INHALATION_SPRAY | CUTANEOUS | Status: DC | PRN

## 2013-02-20 MED ORDER — LIDOCAINE HCL (PF) 1 % IJ SOLN: 5.0000 mL | INTRAMUSCULAR | Status: DC | PRN

## 2013-02-20 NOTE — Progress Notes (Signed)
CRITICAL VALUE ALERT  Critical value received: K+ 6.8  Date of notification:  02/20/13  Time of notification:  0530  Critical value read back:yes  Nurse who received alert:  Denn[place]Harwood  MD notified (1st page):  Servando Snare  Time of first page:  0545  MD notified (2nd page):  Time of second page:  Responding MD:    Time MD responded:

## 2013-02-20 NOTE — Progress Notes (Signed)
Patient ID: [name] [name], male   DOB: January 19, 1961, 52 y.o.   MRN: [id number]  SICU Evening Rounds:  Hemodynamically stable with borderline bp in high 80's after HD today. Asymptomatic. Chest tube output low. No air leak visible.  Up in chair for 2 hrs after dialysis.

## 2013-02-20 NOTE — Progress Notes (Signed)
Pt hemodialysis treatment complete without adverse events.  Dialysis treatment discontinued 45 minutes per pt. request.  Dr. Jonnie Finner notified and AMA signed and placed in chart.  Vital signs are stable and pt is currently resting comfortably.  Report given to Sid Falcon, RN.

## 2013-02-20 NOTE — Consult Note (Signed)
[name] [name] 02/20/2013 [place] [facility] Requesting Physician:  Dr Roxan Hockey  Reason for Consult:  Need for dialysis, hyperkalemia, esrd HPI: The patient is a 52 y.o. year-old AAM with HTN, ESRD on HD at [facility] on MWF. Was admitted for VATS with repeat decortication and repair of air leak which was done yesterday. Is off vent today, K high 6.7, BP soft 95 systolic. Patient lethargic but fully oriented  Pt had original surgery on Jan 9 with decortication for severly entrapped R lung, went home with chest tubes due to air leak. Was readmitted with fevers and MSSA bacteremia with inc'd air leak and underwent another VATS with placement of bronchial valves x 2 and talc pleurodesis. [place] on 2/11, TEE was also done negative for vegetation. 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  ROS  no abd pain  no sob  no skin rash or SOB   no HA or blurry vision   Past Medical History:  Past Medical History  Diagnosis Date  . Secondary hyperparathyroidism   . Hypertension   . Polysubstance abuse   . Anemia of chronic disease     BL Hgb ~10  . Tobacco abuse   . CHF (congestive heart failure)   . Pneumonia     hx of  . End-stage renal disease on hemodialysis     HD m/w/f - right arm graft  . History of blood transfusion     Past Surgical History:  Past Surgical History  Procedure Laterality Date  . Surgery for horseshoe kidney    . Colonoscopy  12/16/2012    Procedure: COLONOSCOPY;  Surgeon: Milus Banister, MD;  Location: [place];  Service: Endoscopy;  Laterality: N/A;  . Video assisted thoracoscopy  12/18/2012    Procedure: VIDEO ASSISTED THORACOSCOPY;  Surgeon: Melrose Nakayama, MD;  Location: [place];  Service: Thoracic;  Laterality: Right;  pleural peel  . Pleural effusion drainage  12/18/2012    Procedure: DRAINAGE OF  PLEURAL EFFUSION;  Surgeon: Melrose Nakayama, MD;  Location: [place];  Service: Thoracic;  Laterality: Right;  . Video bronchoscopy with insertion of interbronchial valve (ibv)  01/09/2013    Procedure: VIDEO BRONCHOSCOPY WITH INSERTION OF INTERBRONCHIAL VALVE (IBV);  Surgeon: Melrose Nakayama, MD;  Location: [place];  Service: Thoracic;  Laterality: N/A;  placement of 2 Intrabronchial valves in right lower lobe  . Tee without cardioversion N/A 01/16/2013    Procedure: TRANSESOPHAGEAL ECHOCARDIOGRAM (TEE);  Surgeon: Sanda Klein, MD;  Location: [place];  Service: Cardiovascular;  Laterality: N/A;  . Talc pleurodesis Right 02/05/2013    Procedure: Pietro Cassis;  Surgeon: Melrose Nakayama, MD;  Location: [facility] OR;  Service: Thoracic;  Laterality: Right;    Family History:  Family History  Problem Relation Age of Onset  . Thyroid disease Mother   . Renal Disease     Social History:  reports that he quit smoking about 2 months ago. His smoking use included Cigarettes. He has a 10 pack-year smoking history. He does not have any smokeless tobacco history on file. He reports that he does not drink alcohol or use illicit drugs.  Allergies:  Allergies  Allergen Reactions  . Tape Other (See Comments)    blister    Home medications: Prior to Admission medications   Medication Sig Start Date End Date Taking? Authorizing Provider  calcium  acetate (PHOSLO) 667 MG capsule Take 2,668 mg by mouth 3 (three) times daily with meals.    Yes Historical Provider, MD  calcium acetate (PHOSLO) 667 MG capsule Take 2,001 mg by mouth with snacks.   Yes Historical Provider, MD  doxercalciferol (HECTOROL) 4 MCG/2ML injection Inject 4.5 mcg into the vein every Monday, Wednesday, and Friday with hemodialysis. 12/30/12  Yes Wayne E Gold, PA-C  metoprolol tartrate (LOPRESSOR) 25 MG tablet Take 25 mg by mouth 2 (two) times daily. 12/30/12  Yes Wayne E Gold, PA-C  multivitamin (RENA-VIT) TABS tablet Take 1  tablet by mouth at bedtime. 12/30/12  Yes Wayne E Gold, PA-C  oxyCODONE-acetaminophen (PERCOCET/ROXICET) 5-325 MG per tablet Take 1-2 tablets by mouth every 4 (four) hours as needed. 02/16/13  Yes Rexene Alberts, MD  warfarin (COUMADIN) 2.5 MG tablet Take 1 tablet (2.5 mg total) by mouth every Monday, Wednesday, and Friday at 6 PM. 01/20/13   Erin Barrett, PA-C  warfarin (COUMADIN) 5 MG tablet Take 1 tablet (5 mg total) by mouth every Tuesday, Thursday, Saturday, and Sunday at 6 PM. 01/20/13   Ellwood Handler, PA-C    Labs: Basic Metabolic Panel:  Recent Labs Lab 02/17/13 1345 02/19/13 0738 02/19/13 1258 02/19/13 1457 02/20/13 0431 02/20/13 0539 02/20/13 0614  NA 137 138 139 138 136 135  --   K 4.8 4.3 4.4 4.5 6.8* 6.5* 6.7*  CL 97  --   --   --  99  --   --   CO2 24  --   --   --  26  --   --   GLUCOSE 80 100* 89 99 98 95  --   BUN 41*  --   --   --  46*  --   --   CREATININE 9.53*  --   --   --  9.78*  --   --   CALCIUM 10.3  --   --   --  9.2  --   --    Liver Function Tests:  Recent Labs Lab 02/17/13 1345  AST 17  ALT <5  ALKPHOS 82  BILITOT 0.3  PROT 8.9*  ALBUMIN 2.2*   No results found for this basename: LIPASE, AMYLASE,  in the last 168 hours No results found for this basename: AMMONIA,  in the last 168 hours CBC:  Recent Labs Lab 02/17/13 1345  02/19/13 1703 02/20/13 02/20/13 0431 02/20/13 0539  WBC 6.5  --  8.2 15.6* 13.5*  --   HGB 6.8*  < > 7.9* 8.0* 7.7* 7.1*  HCT 22.7*  < > 23.8* 23.5* 23.8* 21.0*  MCV 91.9  --  86.9 85.5 87.5  --   PLT 259  --  205 245 238  --   < > = values in this interval not displayed. PT/INR: @LABRCNTIP (inr:5) Cardiac Enzymes: )No results found for this basename: CKTOTAL, CKMB, CKMBINDEX, TROPONINI,  in the last 168 hours CBG:  Recent Labs Lab 02/20/13 0755  GLUCAP 86    Physical Exam:  Blood pressure 87/52, pulse 123, temperature 98.1 F (36.7 C), temperature source Oral, resp. rate 13, weight 97.4 kg (214 lb 11.7  oz), SpO2 98.00%.  Gen: lethargic, easily arouses and responsive HEENT:  EOMI, sclera anicteric, throat clear Neck: no JVD, no LAN Chest: clear ant and lat CV: regular, no rub or gallop, pedal pulses intact Abdomen: soft, nontender, no mass or HSM Ext: no LE edema, no joint effusion or deformity, no gangrene or ulceration Neuro:  lethargic, responds to voice and moves all extremities Access: RUA AVG patent  Outpatient HD: [place] [facility] 3 hr 15 min  2K/2Ca bath RUA AVF  EPO 14000  Heparin none  EDW 94 kg    Impression/Plan 1. S/P VATS w repeat decortication and repair of air leak 3/14 2. ESRD, cont mwf HD 3. HTN/volume- 3 kg over dry wt, BP low, UF 1-2 kg as tolerated. Stop metoprolol which is only home BP med.  4. Anemia of CKD- darbe 100ug/Fri, Hb 7's to get one unit prbc with hd today 5. Secondary HPTH- adj Ca high, not on vit D at center, on Ca acetate, may need to lower binders if phos Colin Broach  MD [place] pgr    [phone] cell 02/20/2013, 10:11 AM

## 2013-02-20 NOTE — Progress Notes (Addendum)
1 Day Post-Op Procedure(s) (LRB): VIDEO BRONCHOSCOPY WITH INSERTION OF INTERBRONCHIAL VALVE (IBV) (Right) VIDEO ASSISTED THORACOSCOPY (VATS)/THOROCOTOMY (Right) Subjective: C/o pain C/o being thirsty  Objective: Vital signs in last 24 hours: Temp:  [97.5 F (36.4 C)-98.6 F (37 C)] 98.3 F (36.8 C) (03/14 0756) Pulse Rate:  [54-128] 108 (03/14 0800) Cardiac Rhythm:  [-] Normal sinus rhythm (03/14 0600) Resp:  [0-21] 10 (03/14 0800) BP: (81-124)/(52-81) 94/59 mmHg (03/14 0800) SpO2:  [87 %-100 %] 94 % (03/14 0800) Arterial Line BP: (72-142)/(49-84) 97/59 mmHg (03/14 0700) FiO2 (%):  [100 %] 100 % (03/13 1926) Weight:  [214 lb 11.7 oz (97.4 kg)] 214 lb 11.7 oz (97.4 kg) (03/14 0715)  Hemodynamic parameters for last 24 hours:    Intake/Output from previous day: 03/13 0701 - 03/14 0700 In: 5538.5 [I.V.:3116; Blood:2022.5; IV Piggyback:400] Out: 3050 [Emesis/NG output:50; Blood:2500; Chest Tube:500] Intake/Output this shift:    General appearance: alert and mild distress Neurologic: intact Heart: regular rate and rhythm Lungs: diminished breath sounds right side small air leak, serosanguinous drainage  Lab Results:  Recent Labs  02/20/13 02/20/13 0431 02/20/13 0539  WBC 15.6* 13.5*  --   HGB 8.0* 7.7* 7.1*  HCT 23.5* 23.8* 21.0*  PLT 245 238  --    BMET:  Recent Labs  02/17/13 1345  02/20/13 0431 02/20/13 0539 02/20/13 0614  NA 137  < > 136 135  --   K 4.8  < > 6.8* 6.5* 6.7*  CL 97  --  99  --   --   CO2 24  --  26  --   --   GLUCOSE 80  < > 98 95  --   BUN 41*  --  46*  --   --   CREATININE 9.53*  --  9.78*  --   --   CALCIUM 10.3  --  9.2  --   --   < > = values in this interval not displayed.  PT/INR:  Recent Labs  02/19/13 1703  LABPROT 16.9*  INR 1.41   ABG    Component Value Date/Time   PHART 7.377 02/20/2013 0425   HCO3 27.5* 02/20/2013 0425   TCO2 29 02/20/2013 0425   O2SAT 93.0 02/20/2013 0425   CBG (last 3)   Recent Labs   02/20/13 0755  GLUCAP 86    Assessment/Plan: S/P Procedure(s) (LRB): VIDEO BRONCHOSCOPY WITH INSERTION OF INTERBRONCHIAL VALVE (IBV) (Right) VIDEO ASSISTED THORACOSCOPY (VATS)/THOROCOTOMY (Right) POD # 1 Redo thoracotomy, decortication, remaoval of interbronchial valves CV- mildly tachy on HD  RESP- has only a small air leak, CT to water seal  CXR looks about as good as you would expect  RENAL- on HD for hyperkalemia   ANEMIA- acute secondary to ABL on chronic due to ESRD- transfuse while on HD this AM  Mobilize   LOS: 1 day    HENDRICKSON,STEVEN C 02/20/2013  Will wait another 24 hours before restarting warfarin that he was started on during his last admission for an IJ thrombus.

## 2013-02-20 NOTE — Progress Notes (Signed)
UR Completed.  [name] [name] [id number] 02/20/2013

## 2013-02-20 NOTE — Op Note (Signed)
[name], [name]                 ACCOUNT NO.:  [id number]  MEDICAL RECORD NO.:  [id number]  LOCATION:  2306                         FACILITY:  [facility]  PHYSICIAN:  Revonda Standard. Roxan Hockey, M.D.DATE OF BIRTH:  June 15, 1961  DATE OF PROCEDURE:  02/19/2013 DATE OF DISCHARGE:                              OPERATIVE REPORT   PREOPERATIVE DIAGNOSIS:  Status post decortication with loculated pleural effusion and persistent air leak.  POSTOPERATIVE DIAGNOSIS:  Status post decortication with loculated pleural effusion and persistent air leak.  PROCEDURES: 1. Video bronchoscopy with removal of intrabronchial valves from     segmental bronchi of the right lower lobe. 2. Right thoracotomy, decortication, suture repair of air leak.  SURGEON:  Revonda Standard. Roxan Hockey, M.D.  ASSISTANTJunie Panning Barrett.  ANESTHESIA:  General.  FINDINGS:  Intrabronchial valves removed without difficulty, they were still in good position where they have been placed originally.  Right thoracotomy: A portion of rib had to be resected to allow exposure. Dense fibrotic reaction on visceral and parietal pleura. Trapped right lung.  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  CLINICAL NOTE:  [name] is a 52 year old gentleman who had previously undergone a right video-assisted drainage of pleural effusion and decortication.  He had a prolonged air leak despite prolonged chest tube drainage, placement of intrabronchial valves and attempt at talc pleurodesis.  His air leak persisted.  Finally, it was recommended the patient undergo redo surgery to repair the air leak and drain the pleural effusion and decorticate the lung.  We also plan to remove the intrabronchial valves at the time of surgery.  The indications, risks, benefits, and alternatives were discussed in detail with the patient. He understood and  accepted the risks and agreed to proceed.  OPERATIVE NOTE:  Mr. Dejongh was brought to the preoperative holding area on February 19, 2013.  There, anesthesia placed a central line and established an arterial blood pressure monitoring line.  Intravenous antibiotics were administered.  He was taken to the operating room, anesthetized, and intubated.  PAS hose were placed for DVT prophylaxis. Flexible fiberoptic bronchoscopy was performed via the endotracheal tube.  The intrabronchial valves were identified, they were in good position at the site where they had originally been placed, each was removed without difficulty.  There was no abnormality seen endobronchially.  The patient was then reintubated with a double-lumen endotracheal tube. He was placed in a left lateral decubitus position, and the right chest was prepped and draped in usual sterile fashion.  An incision was made posterior to his two previous chest tube sites, was carried through the skin and subcutaneous tissue.  The chest was entered bluntly, but a clear plane could not be established for placement of the scope. Therefore, the previous minithoracotomy was opened and this was extended anteriorly and posteriorly.  The anterior portion of the latissimus was divided.  The serratus muscle fibers were separated.  The intercostal space was entered with electrocautery, but we were unable to achieve chest entry.  The intercostal spaces were relatively small, and the periosteal elevator was used to elevate  the periosteum off of the underlying rib and a segment of the rib was removed for exposure.  The chest then was entered.  There was a thick peel on the parietal pleura. There was a fluid collection inferiorly with gelatinous debris and fibrinous debris.  It was clear that the lung was trapped after evacuating the fluid.  Adhesions and the fibrous peel were taken down enough to allow the scope to be placed through the original  port incision.  Inspection revealed a bronchopleural fistula on the diaphragmatic surface of the right lower lobe, in the center of the right lower lobe, this was about 2 mm in diameter.  No other air leaks were noted. Further exploration revealed loculated fluid collections anteriorly and superiorly and posteriorly and superiorly.  After removing the fluid and debris, the parietal pleura was decorticated.  There was extensive bleeding from that.  The original plan was to just decorticate the visceral pleura enough to allow closure of the air leak.  However, it was clear that the lung was not going to re-expand adequately; therefore, the decision made to perform as much of the decortication as was possible.  This was somewhat limited due to exposure through the incision, but the lower lobe was decorticated well.  The site of the bronchopleural fistula was injected with ProGEL and then suture ligated with a 3-0 Vicryl suture and then ProGEL was applied over the sutures as well.  Two other areas that had tears during the decortication were treated in a similar fashion.  The upper lobe could not be completely decorticated nor could any portion of the lung to be completely decorticated due to the extensive scarring down into the mediastinum.  The chest was copiously irrigated with warm saline.  There was about 90% reexpansion of the right lung at the completion of the decortication.  A 28-French Blake drain was placed anteriorly and a 32-French Blake drain was placed posteriorly.  These were placed through the original two chest tube incisions from his previous surgery.  The port incision was closed in 2 layers.  The thoracotomy was closed with #2 Vicryl pericostal sutures followed by #1 Vicryl fascial sutures, 2-0 Vicryl subcutaneous suture, and the skin was closed with staples.  All sponge, needle, and instrument counts were correct at the end of the procedure. The patient was extubated  in the operating room, taken to the postanesthetic care unit in good condition.     Revonda Standard Roxan Hockey, M.D.     SCH/MEDQ  D:  02/19/2013  T:  02/20/2013  Job:  [id number]

## 2013-02-21 ENCOUNTER — Encounter (HOSPITAL_COMMUNITY): Payer: Self-pay | Admitting: Thoracic Surgery (Cardiothoracic Vascular Surgery)

## 2013-02-21 ENCOUNTER — Inpatient Hospital Stay (HOSPITAL_COMMUNITY): Payer: Medicare Other

## 2013-02-21 LAB — COMPREHENSIVE METABOLIC PANEL
AST: 25 U/L (ref 0–37)
CO2: 30 mEq/L (ref 19–32)
Chloride: 96 mEq/L (ref 96–112)
Creatinine, Ser: 7.32 mg/dL — ABNORMAL HIGH (ref 0.50–1.35)
GFR calc non Af Amer: 8 mL/min — ABNORMAL LOW (ref >90)
Total Bilirubin: 0.2 mg/dL — ABNORMAL LOW (ref 0.3–1.2)

## 2013-02-21 LAB — CBC
HCT: 22.2 % — ABNORMAL LOW (ref 39.0–52.0)
Hemoglobin: 7.4 g/dL — ABNORMAL LOW (ref 13.0–17.0)
MCV: 88.4 fL (ref 78.0–100.0)
Platelets: 227 10*3/uL (ref 150–400)
RBC: 2.51 MIL/uL — ABNORMAL LOW (ref 4.22–5.81)
WBC: 10.7 10*3/uL — ABNORMAL HIGH (ref 4.0–10.5)

## 2013-02-21 LAB — TYPE AND SCREEN
Unit division: 0
Unit division: 0

## 2013-02-21 LAB — PROTIME-INR: INR: 1.37 (ref 0.00–1.49)

## 2013-02-21 MED ORDER — POLYETHYLENE GLYCOL 3350 17 G PO PACK
17.0000 g | PACK | Freq: Every day | ORAL | Status: DC
  Administered 2013-02-21 – 2013-03-06 (×8): 17 g via ORAL
  Filled 2013-02-21 (×14): qty 1

## 2013-02-21 NOTE — Progress Notes (Signed)
2 Days Post-Op Procedure(s) (LRB): VIDEO BRONCHOSCOPY WITH INSERTION OF INTERBRONCHIAL VALVE (IBV) (Right) VIDEO ASSISTED THORACOSCOPY (VATS)/THOROCOTOMY (Right) Subjective: No complaints  Objective: Vital signs in last 24 hours: Temp:  [97.5 F (36.4 C)-98.7 F (37.1 C)] 98.2 F (36.8 C) (03/15 0757) Pulse Rate:  [105-125] 112 (03/15 1000) Cardiac Rhythm:  [-] Sinus tachycardia (03/15 1000) Resp:  [0-24] 17 (03/15 1000) BP: (84-118)/(46-85) 88/51 mmHg (03/15 1000) SpO2:  [90 %-100 %] 96 % (03/15 1000) FiO2 (%):  [94 %] 94 % (03/14 1820) Weight:  [96.7 kg (213 lb 3 oz)-212 kg (467 lb 6 oz)] 212 kg (467 lb 6 oz) (03/15 0500)  Hemodynamic parameters for last 24 hours:    Intake/Output from previous day: 03/14 0701 - 03/15 0700 In: [id number] [P.O.:240; I.V.:698; Blood:350] Out: 1013 [Urine:10; Chest Tube:260] Intake/Output this shift: Total I/O In: 208 [P.O.:120; I.V.:88] Out: -   General appearance: alert and cooperative Heart: regular rate and rhythm, S1, S2 normal, no murmur, click, rub or gallop Lungs: diminished breath sounds RLL no air leak from chest tube  Lab Results:  Recent Labs  02/20/13 0431 02/20/13 0539 02/21/13 0412  WBC 13.5*  --  10.7*  HGB 7.7* 7.1* 7.4*  HCT 23.8* 21.0* 22.2*  PLT 238  --  227   BMET:  Recent Labs  02/20/13 0431 02/20/13 0539 02/20/13 0614 02/21/13 0412  NA 136 135  --  135  K 6.8* 6.5* 6.7* 4.6  CL 99  --   --  96  CO2 26  --   --  30  GLUCOSE 98 95  --  111*  BUN 46*  --   --  29*  CREATININE 9.78*  --   --  7.32*  CALCIUM 9.2  --   --  9.3    PT/INR:  Recent Labs  02/21/13 0412  LABPROT 16.5*  INR 1.37   ABG    Component Value Date/Time   PHART 7.377 02/20/2013 0425   HCO3 27.5* 02/20/2013 0425   TCO2 29 02/20/2013 0425   O2SAT 93.0 02/20/2013 0425   CBG (last 3)   Recent Labs  02/20/13 0755  GLUCAP 86    Assessment/Plan: S/P Procedure(s) (LRB): VIDEO BRONCHOSCOPY WITH INSERTION OF INTERBRONCHIAL  VALVE (IBV) (Right) VIDEO ASSISTED THORACOSCOPY (VATS)/THOROCOTOMY (Right) Mobilize IS Continue chest tube to water seal Acute blood loss anemia: observe   LOS: 2 days    [name] [name] K 02/21/2013

## 2013-02-21 NOTE — Progress Notes (Signed)
Patient ID: [name] [name], male   DOB: 1961/08/21, 52 y.o.   MRN: [id number]  Stable day  Up in chair talking with family  No air leak from chest tube.

## 2013-02-21 NOTE — Progress Notes (Signed)
Subjective: Up in chair, sedated with pain meds, CT in place, no complaints  Objective Vital signs in last 24 hours: Filed Vitals:   02/21/13 0757 02/21/13 0800 02/21/13 0844 02/21/13 0900  BP:  91/50  90/48  Pulse:  108  109  Temp: 98.2 F (36.8 C)     TempSrc: Oral     Resp:  21  23  Weight:      SpO2:  95% 100% 95%   Weight change:   Intake/Output Summary (Last 24 hours) at 02/21/13 0959 Last data filed at 02/21/13 0900  Gross per 24 hour  Intake    906 ml  Output    983 ml  Net    -77 ml   Labs: Basic Metabolic Panel:  Recent Labs Lab 02/17/13 1345  02/19/13 1457 02/20/13 0431 02/20/13 0539 02/20/13 0614 02/20/13 0800 02/21/13 0412  NA 137  < > 138 136 135  --   --  135  K 4.8  < > 4.5 6.8* 6.5* 6.7*  --  4.6  CL 97  --   --  99  --   --   --  96  CO2 24  --   --  26  --   --   --  30  GLUCOSE 80  < > 99 98 95  --   --  111*  BUN 41*  --   --  46*  --   --   --  29*  CREATININE 9.53*  --   --  9.78*  --   --   --  7.32*  CALCIUM 10.3  --   --  9.2  --   --   --  9.3  PHOS  --   --   --   --   --   --  8.3*  --   < > = values in this interval not displayed. Liver Function Tests:  Recent Labs Lab 02/17/13 1345 02/21/13 0412  AST 17 25  ALT <5 <5  ALKPHOS 82 61  BILITOT 0.3 0.2*  PROT 8.9* 7.0  ALBUMIN 2.2* 1.8*   No results found for this basename: LIPASE, AMYLASE,  in the last 168 hours No results found for this basename: AMMONIA,  in the last 168 hours CBC:  Recent Labs Lab 02/19/13 1703 02/20/13 02/20/13 0431 02/20/13 0539 02/21/13 0412  WBC 8.2 15.6* 13.5*  --  10.7*  HGB 7.9* 8.0* 7.7* 7.1* 7.4*  HCT 23.8* 23.5* 23.8* 21.0* 22.2*  MCV 86.9 85.5 87.5  --  88.4  PLT 205 245 238  --  227   PT/INR: @LABRCNTIP (inr:5)   Scheduled Meds ) . antiseptic oral rinse  15 mL Mouth Rinse q12n4p  . bisacodyl  10 mg Oral Daily  . calcium acetate  2,001 mg Oral With snacks  . calcium acetate  2,668 mg Oral TID WC  . chlorhexidine  15 mL Mouth  Rinse BID  . darbepoetin  100 mcg Intravenous Q7 days  . fentaNYL   Intravenous Q4H  . levalbuterol  0.63 mg Nebulization Q6H  . multivitamin  1 tablet Oral QHS    Physical Exam:  Blood pressure 90/48, pulse 109, temperature 98.2 F (36.8 C), temperature source Oral, resp. rate 23, weight 212 kg (467 lb 6 oz), SpO2 95.00%.  Gen: looks stronger, up in chair, no distress, lethargic prob from pain meds HEENT: EOMI, sclera anicteric, throat clear  Neck: no JVD, no LAN  Chest: clear ant and lat,  chest tube in place CV: regular, no rub or gallop, pedal pulses intact  Abdomen: soft, nontender, no mass or HSM  Ext: no LE edema, no joint effusion or deformity, no gangrene or ulceration  Neuro: no focal deficit Access: RUA AVG patent   Outpatient HD: [place] [facility] 3 hr 15 min EDW 94 kg  2K/2Ca bath RUA AVF EPO 14000 Heparin none No vit D or IV fe  Impression/Plan  1. S/P VATS w repeat decortication and surgical repair of air leak 3/14 2. ESRD, cont mwf HD 3. HTN/volume- UF 2.5 yest, 2 kg over EDW today. BP's low, post-op most likely. Metoprolol only home med and is on hold. HD Monday, UF to dry wt if BP better.  4. Anemia of CKD- darbe 100ug/Fri, s/p prbc x 1 on 3/14 for Hb 7 5. Secondary HPTH- adj Ca high, not on vit D at center (I have d/c'd  Hectorol from PTA med list), on phoslo only as binder. Phos high, will keep binders as is for now.   Kelly Splinter  MD [phone] pgr    [phone] cell 02/21/2013, 9:59 AM

## 2013-02-22 ENCOUNTER — Inpatient Hospital Stay (HOSPITAL_COMMUNITY): Payer: Medicare Other

## 2013-02-22 MED ORDER — WHITE PETROLATUM GEL
Status: DC | PRN
  Filled 2013-02-22: qty 5

## 2013-02-22 MED ORDER — LEVALBUTEROL HCL 0.63 MG/3ML IN NEBU
0.6300 mg | INHALATION_SOLUTION | Freq: Three times a day (TID) | RESPIRATORY_TRACT | Status: DC
  Administered 2013-02-23 – 2013-02-24 (×3): 0.63 mg via RESPIRATORY_TRACT
  Filled 2013-02-22 (×6): qty 3

## 2013-02-22 MED ORDER — CALCIUM CARBONATE ANTACID 500 MG PO CHEW
1.0000 | CHEWABLE_TABLET | Freq: Three times a day (TID) | ORAL | Status: DC | PRN
  Administered 2013-02-22: 200 mg via ORAL
  Filled 2013-02-22: qty 1

## 2013-02-22 NOTE — Progress Notes (Signed)
3 Days Post-Op Procedure(s) (LRB): VIDEO BRONCHOSCOPY WITH INSERTION OF INTERBRONCHIAL VALVE (IBV) (Right) VIDEO ASSISTED THORACOSCOPY (VATS)/THOROCOTOMY (Right) Subjective: Sore chest, coughing up some blood tinged sputum, small clots  Objective: Vital signs in last 24 hours: Temp:  [97.3 F (36.3 C)-99 F (37.2 C)] 99 F (37.2 C) (03/16 0400) Pulse Rate:  [93-112] 105 (03/16 0600) Cardiac Rhythm:  [-] Sinus tachycardia (03/15 2200) Resp:  [12-23] 20 (03/16 0600) BP: (88-118)/(48-89) 118/58 mmHg (03/16 0600) SpO2:  [93 %-100 %] 97 % (03/16 0600) Weight:  [96.9 kg (213 lb 10 oz)] 96.9 kg (213 lb 10 oz) (03/16 0600)  Hemodynamic parameters for last 24 hours:    Intake/Output from previous day: 03/15 0701 - 03/16 0700 In: 829 [P.O.:340; I.V.:489] Out: 70 [Chest Tube:70] Intake/Output this shift:    General appearance: alert and cooperative Heart: regular rate and rhythm, S1, S2 normal, no murmur, click, rub or gallop Lungs: diminished breath sounds over right lung Wound: incisions ok no air leak from chest tubes  Lab Results:  Recent Labs  02/20/13 0431 02/20/13 0539 02/21/13 0412  WBC 13.5*  --  10.7*  HGB 7.7* 7.1* 7.4*  HCT 23.8* 21.0* 22.2*  PLT 238  --  227   BMET:  Recent Labs  02/20/13 0431 02/20/13 0539 02/20/13 0614 02/21/13 0412  NA 136 135  --  135  K 6.8* 6.5* 6.7* 4.6  CL 99  --   --  96  CO2 26  --   --  30  GLUCOSE 98 95  --  111*  BUN 46*  --   --  29*  CREATININE 9.78*  --   --  7.32*  CALCIUM 9.2  --   --  9.3    PT/INR:  Recent Labs  02/22/13 0400  LABPROT 16.2*  INR 1.33   ABG    Component Value Date/Time   PHART 7.377 02/20/2013 0425   HCO3 27.5* 02/20/2013 0425   TCO2 29 02/20/2013 0425   O2SAT 93.0 02/20/2013 0425   CBG (last 3)   Recent Labs  02/20/13 0755  GLUCAP 86    Assessment/Plan: S/P Procedure(s) (LRB): VIDEO BRONCHOSCOPY WITH INSERTION OF INTERBRONCHIAL VALVE (IBV) (Right) VIDEO ASSISTED THORACOSCOPY  (VATS)/THOROCOTOMY (Right) Continue chest tubes to water seal. No air leak for past 2 days and drainage decreasing quickly so may be able to remove one of tubes tomorrow. Continue IS, ambulation. HD tomorrow.   LOS: 3 days    BARTLE,BRYAN K 02/22/2013

## 2013-02-22 NOTE — Progress Notes (Signed)
Subjective: Up in chair, CT in place, no complaints  Objective Vital signs in last 24 hours: Filed Vitals:   02/22/13 0545 02/22/13 0600 02/22/13 0800 02/22/13 0834  BP:  118/58 120/60   Pulse: 102 105    Temp:    98.2 F (36.8 C)  TempSrc:    Oral  Resp: 21 20 18    Weight:  96.9 kg (213 lb 10 oz)    SpO2: 95% 97% 98%    Weight change: -0.5 kg (-1 lb 1.6 oz)  Intake/Output Summary (Last 24 hours) at 02/22/13 1107 Last data filed at 02/22/13 0900  Gross per 24 hour  Intake    901 ml  Output     50 ml  Net    851 ml   Labs: Basic Metabolic Panel:  Recent Labs Lab 02/17/13 1345  02/19/13 1457 02/20/13 0431 02/20/13 0539 02/20/13 0614 02/20/13 0800 02/21/13 0412  NA 137  < > 138 136 135  --   --  135  K 4.8  < > 4.5 6.8* 6.5* 6.7*  --  4.6  CL 97  --   --  99  --   --   --  96  CO2 24  --   --  26  --   --   --  30  GLUCOSE 80  < > 99 98 95  --   --  111*  BUN 41*  --   --  46*  --   --   --  29*  CREATININE 9.53*  --   --  9.78*  --   --   --  7.32*  CALCIUM 10.3  --   --  9.2  --   --   --  9.3  PHOS  --   --   --   --   --   --  8.3*  --   < > = values in this interval not displayed. Liver Function Tests:  Recent Labs Lab 02/17/13 1345 02/21/13 0412  AST 17 25  ALT <5 <5  ALKPHOS 82 61  BILITOT 0.3 0.2*  PROT 8.9* 7.0  ALBUMIN 2.2* 1.8*   No results found for this basename: LIPASE, AMYLASE,  in the last 168 hours No results found for this basename: AMMONIA,  in the last 168 hours CBC:  Recent Labs Lab 02/19/13 1703 02/20/13 02/20/13 0431 02/20/13 0539 02/21/13 0412  WBC 8.2 15.6* 13.5*  --  10.7*  HGB 7.9* 8.0* 7.7* 7.1* 7.4*  HCT 23.8* 23.5* 23.8* 21.0* 22.2*  MCV 86.9 85.5 87.5  --  88.4  PLT 205 245 238  --  227   PT/INR: @LABRCNTIP (inr:5)   Scheduled Meds ) . antiseptic oral rinse  15 mL Mouth Rinse q12n4p  . calcium acetate  2,001 mg Oral With snacks  . calcium acetate  2,668 mg Oral TID WC  . chlorhexidine  15 mL Mouth Rinse BID   . darbepoetin  100 mcg Intravenous Q7 days  . fentaNYL   Intravenous Q4H  . levalbuterol  0.63 mg Nebulization Q6H  . multivitamin  1 tablet Oral QHS  . polyethylene glycol  17 g Oral Daily    Physical Exam:  Blood pressure 120/60, pulse 105, temperature 98.2 F (36.8 C), temperature source Oral, resp. rate 18, weight 96.9 kg (213 lb 10 oz), SpO2 98.00%.  Gen: up in chair, no distress Neck: no JVD, no LAN  Chest: clear ant and lat, chest tube in place CV: regular,  no rub or gallop, pedal pulses intact  Abdomen: soft, nontender, no mass or HSM  Ext: no LE edema, no joint effusion or deformity, no gangrene or ulceration  Neuro: no focal deficit Access: RUA AVG patent   Outpatient HD: [place] [facility] 3 hr 15 min EDW 94 kg  2K/2Ca bath RUA AVF EPO 14000 Heparin none No vit D or IV fe  Impression/Plan  1. S/P VATS w repeat decortication and surgical repair of air leak 3/14 2. ESRD, cont mwf HD 3. HTN/volume- up 3 kg by wts. BP's were soft, may be improving. UF 2-3 kg with HD Monday as tolerated. Metoprolol only home med and is on hold. 4. Anemia of CKD- darbe 100ug/Fri, s/p prbc x 1 on 3/14 for Hb 7 5. Secondary HPTH- adj Ca high, not on vit D at center (I have d/c'd  Hectorol from PTA med list), on phoslo as binder.   [name] Splinter  MD [phone] pgr    [phone] cell 02/22/2013, 11:07 AM

## 2013-02-23 ENCOUNTER — Inpatient Hospital Stay (HOSPITAL_COMMUNITY): Payer: Medicare Other

## 2013-02-23 LAB — TISSUE CULTURE

## 2013-02-23 LAB — BASIC METABOLIC PANEL
BUN: 55 mg/dL — ABNORMAL HIGH (ref 6–23)
Creatinine, Ser: 11.83 mg/dL — ABNORMAL HIGH (ref 0.50–1.35)
GFR calc Af Amer: 5 mL/min — ABNORMAL LOW (ref >90)
GFR calc non Af Amer: 4 mL/min — ABNORMAL LOW (ref >90)
Potassium: 4.3 mEq/L (ref 3.5–5.1)

## 2013-02-23 LAB — CBC
HCT: 20.7 % — ABNORMAL LOW (ref 39.0–52.0)
MCHC: 32.4 g/dL (ref 30.0–36.0)
RDW: 17.1 % — ABNORMAL HIGH (ref 11.5–15.5)

## 2013-02-23 LAB — PROTIME-INR: INR: 1.17 (ref 0.00–1.49)

## 2013-02-23 MED ORDER — OXYCODONE-ACETAMINOPHEN 5-325 MG PO TABS
ORAL_TABLET | ORAL | Status: AC
  Administered 2013-02-23: 2
  Filled 2013-02-23: qty 2

## 2013-02-23 MED ORDER — WARFARIN SODIUM 5 MG PO TABS
5.0000 mg | ORAL_TABLET | ORAL | Status: DC
  Administered 2013-02-24 – 2013-03-03 (×5): 5 mg via ORAL
  Filled 2013-02-23 (×6): qty 1

## 2013-02-23 MED ORDER — WARFARIN - PHYSICIAN DOSING INPATIENT
Freq: Every day | Status: DC
  Administered 2013-02-24 – 2013-03-04 (×2)

## 2013-02-23 MED ORDER — WARFARIN SODIUM 2.5 MG PO TABS
2.5000 mg | ORAL_TABLET | ORAL | Status: DC
  Administered 2013-02-23 – 2013-03-04 (×5): 2.5 mg via ORAL
  Filled 2013-02-23 (×5): qty 1

## 2013-02-23 NOTE — Progress Notes (Signed)
CRITICAL VALUE ALERT  Critical value received: Hgb 6.7   Date of notification:  02/23/13  Time of notification:  0450  Critical value read back:yes  Nurse who received alert:  Candyce Churn  MD notified (1st page):  Dr. Jonnie Finner   Time of first page:  0533  MD notified (2nd page):  Time of second page:  Responding MD:  Dr. Jonnie Finner  Time MD responded:  [phone]

## 2013-02-23 NOTE — Progress Notes (Signed)
12ml from Fentanyl PCA syringe wasted and rinsed down sink, witnessed by 2 RNs--Margarett Viti and [employer]abil [employer].

## 2013-02-23 NOTE — Progress Notes (Signed)
4 Days Post-Op Procedure(s) (LRB): VIDEO BRONCHOSCOPY WITH INSERTION OF INTERBRONCHIAL VALVE (IBV) (Right) VIDEO ASSISTED THORACOSCOPY (VATS)/THOROCOTOMY (Right) Subjective: Feels well overall   Objective: Vital signs in last 24 hours: Temp:  [98.2 F (36.8 C)-99 F (37.2 C)] 98.9 F (37.2 C) (03/17 0400) Pulse Rate:  [83-142] 97 (03/17 0600) Cardiac Rhythm:  [-] Sinus tachycardia (03/17 0000) Resp:  [10-28] 21 (03/17 0700) BP: (91-134)/(51-67) 121/59 mmHg (03/17 0600) SpO2:  [94 %-98 %] 95 % (03/17 0600) FiO2 (%):  [96 %] 96 % (03/16 2000) Weight:  [217 lb (98.431 kg)] 217 lb (98.431 kg) (03/17 0400)  Hemodynamic parameters for last 24 hours:    Intake/Output from previous day: 03/16 0701 - 03/17 0700 In: [id number] [P.O.:800; I.V.:358] Out: 40 [Chest Tube:40] Intake/Output this shift:    General appearance: alert and no distress Lungs: diminished breath sounds right small air leak  Lab Results:  Recent Labs  02/21/13 0412 02/23/13 0450  WBC 10.7* 6.9  HGB 7.4* 6.7*  HCT 22.2* 20.7*  PLT 227 202   BMET:  Recent Labs  02/21/13 0412 02/23/13 0450  NA 135 135  K 4.6 4.3  CL 96 95*  CO2 30 28  GLUCOSE 111* 101*  BUN 29* 55*  CREATININE 7.32* 11.83*  CALCIUM 9.3 9.5    PT/INR:  Recent Labs  02/23/13 0450  LABPROT 14.7  INR 1.17   ABG    Component Value Date/Time   PHART 7.377 02/20/2013 0425   HCO3 27.5* 02/20/2013 0425   TCO2 29 02/20/2013 0425   O2SAT 93.0 02/20/2013 0425   CBG (last 3)   Recent Labs  02/20/13 0755  GLUCAP 86    Assessment/Plan: S/P Procedure(s) (LRB): VIDEO BRONCHOSCOPY WITH INSERTION OF INTERBRONCHIAL VALVE (IBV) (Right) VIDEO ASSISTED THORACOSCOPY (VATS)/THOROCOTOMY (Right) Plan for transfer to step-down: see transfer orders CV- stable  RESP- small air leak  Dc posterior CT  Will place anterior CT back to suction  RENAL- HD today  Anemia- acute on chronic- will defer to renal whether they want to transfuse on  HD  Ambulate  Resume coumadin for IJ thrombosis   LOS: 4 days    Danica Camarena C 02/23/2013

## 2013-02-23 NOTE — Procedures (Signed)
I have seen and examined this patient and agree with the plan of care. Seen on dialysis with no complaints  [name] [name] W 02/23/2013, 9:42 AM

## 2013-02-23 NOTE — Progress Notes (Addendum)
[facility] BRIEF SICU PROGRESS NOTE  4 Days Post-Op  S/P Procedure(s) (LRB): VIDEO BRONCHOSCOPY WITH INSERTION OF INTERBRONCHIAL VALVE (IBV) (Right) VIDEO ASSISTED THORACOSCOPY (VATS)/THOROCOTOMY (Right)   Stable day Transfused 2 units PRBC's during HD  Plan: Continue current plan  OWEN,CLARENCE H 02/23/2013 6:08 PM

## 2013-02-24 ENCOUNTER — Encounter (HOSPITAL_COMMUNITY): Payer: Self-pay | Admitting: General Practice

## 2013-02-24 ENCOUNTER — Inpatient Hospital Stay (HOSPITAL_COMMUNITY): Payer: Medicare Other

## 2013-02-24 LAB — TYPE AND SCREEN
Antibody Screen: NEGATIVE
Unit division: 0

## 2013-02-24 LAB — PROTIME-INR: INR: 1.23 (ref 0.00–1.49)

## 2013-02-24 MED ORDER — NEPRO/CARBSTEADY PO LIQD: 237.0000 mL | ORAL | Status: DC | PRN

## 2013-02-24 MED ORDER — HEPARIN SODIUM (PORCINE) 1000 UNIT/ML DIALYSIS: 1000.0000 [IU] | INTRAMUSCULAR | Status: DC | PRN

## 2013-02-24 MED ORDER — HEPARIN SODIUM (PORCINE) 1000 UNIT/ML DIALYSIS
1000.0000 [IU] | INTRAMUSCULAR | Status: DC | PRN
  Filled 2013-02-24: qty 1

## 2013-02-24 MED ORDER — SODIUM CHLORIDE 0.9 % IV SOLN: 100.0000 mL | INTRAVENOUS | Status: DC | PRN

## 2013-02-24 MED ORDER — LIDOCAINE-PRILOCAINE 2.5-2.5 % EX CREA: 1.0000 "application " | TOPICAL_CREAM | CUTANEOUS | Status: DC | PRN

## 2013-02-24 MED ORDER — PENTAFLUOROPROP-TETRAFLUOROETH EX AERO: 1.0000 "application " | INHALATION_SPRAY | CUTANEOUS | Status: DC | PRN

## 2013-02-24 MED ORDER — NEPRO/CARBSTEADY PO LIQD
237.0000 mL | ORAL | Status: DC | PRN
  Filled 2013-02-24: qty 237

## 2013-02-24 MED ORDER — LIDOCAINE-PRILOCAINE 2.5-2.5 % EX CREA
1.0000 "application " | TOPICAL_CREAM | CUTANEOUS | Status: DC | PRN
  Filled 2013-02-24: qty 5

## 2013-02-24 MED ORDER — LEVALBUTEROL HCL 0.63 MG/3ML IN NEBU
0.6300 mg | INHALATION_SOLUTION | Freq: Two times a day (BID) | RESPIRATORY_TRACT | Status: DC
  Administered 2013-02-24 – 2013-02-25 (×3): 0.63 mg via RESPIRATORY_TRACT
  Filled 2013-02-24 (×5): qty 3

## 2013-02-24 MED ORDER — ALTEPLASE 2 MG IJ SOLR
2.0000 mg | Freq: Once | INTRAMUSCULAR | Status: AC | PRN
  Filled 2013-02-24: qty 2

## 2013-02-24 MED ORDER — GUAIFENESIN ER 600 MG PO TB12
1200.0000 mg | ORAL_TABLET | Freq: Two times a day (BID) | ORAL | Status: AC
  Administered 2013-02-24 – 2013-02-28 (×10): 1200 mg via ORAL
  Filled 2013-02-24 (×10): qty 2

## 2013-02-24 MED ORDER — LIDOCAINE HCL (PF) 1 % IJ SOLN: 5.0000 mL | INTRAMUSCULAR | Status: DC | PRN

## 2013-02-24 MED ORDER — ALTEPLASE 2 MG IJ SOLR: 2.0000 mg | Freq: Once | INTRAMUSCULAR | Status: DC | PRN

## 2013-02-24 NOTE — Progress Notes (Signed)
5 Days Post-Op Procedure(s) (LRB): VIDEO BRONCHOSCOPY WITH INSERTION OF INTERBRONCHIAL VALVE (IBV) (Right) VIDEO ASSISTED THORACOSCOPY (VATS)/THOROCOTOMY (Right) Subjective: Feels pretty well this AM  Objective: Vital signs in last 24 hours: Temp:  [97.6 F (36.4 C)-99.3 F (37.4 C)] 99 F (37.2 C) (03/18 0400) Pulse Rate:  [88-104] 97 (03/18 0720) Cardiac Rhythm:  [-] Normal sinus rhythm (03/18 0720) Resp:  [12-22] 22 (03/18 0720) BP: (100-131)/(47-82) 122/82 mmHg (03/18 0720) SpO2:  [94 %-100 %] 99 % (03/18 0720) Weight:  [211 lb 3.2 oz (95.8 kg)-215 lb 9.8 oz (97.8 kg)] 211 lb 3.2 oz (95.8 kg) (03/18 0600)  Hemodynamic parameters for last 24 hours:    Intake/Output from previous day: 03/17 0701 - 03/18 0700 In: 1360 [P.O.:660; Blood:700] Out: 3030 [Chest Tube:330] Intake/Output this shift:    General appearance: alert and no distress Neurologic: intact Lungs: diminished breath sounds right Wound: clean and dry very small air leak  Lab Results:  Recent Labs  02/23/13 0450  WBC 6.9  HGB 6.7*  HCT 20.7*  PLT 202   BMET:  Recent Labs  02/23/13 0450  NA 135  K 4.3  CL 95*  CO2 28  GLUCOSE 101*  BUN 55*  CREATININE 11.83*  CALCIUM 9.5    PT/INR:  Recent Labs  02/24/13 0300  LABPROT 15.3*  INR 1.23   ABG    Component Value Date/Time   PHART 7.377 02/20/2013 0425   HCO3 27.5* 02/20/2013 0425   TCO2 29 02/20/2013 0425   O2SAT 93.0 02/20/2013 0425   CBG (last 3)  No results found for this basename: GLUCAP,  in the last 72 hours  Assessment/Plan: S/P Procedure(s) (LRB): VIDEO BRONCHOSCOPY WITH INSERTION OF INTERBRONCHIAL VALVE (IBV) (Right) VIDEO ASSISTED THORACOSCOPY (VATS)/THOROCOTOMY (Right) Plan for transfer to step-down: see transfer orders Awaiting bed on 3300 Air leak decreased today- CXR essentially unchanged Will place on water seal Ambulate Transfused on HD yesterday- recheck Hct in AM   LOS: 5 days    Delores Thelen  C 02/24/2013

## 2013-02-24 NOTE — Progress Notes (Signed)
[facility] ROUNDING NOTE   Subjective:   Interval History:feels a little better no sob  Objective:  Vital signs in last 24 hours:  Temp:  [97.6 F (36.4 C)-99.3 F (37.4 C)] 98.4 F (36.9 C) (03/18 0830) Pulse Rate:  [90-104] 97 (03/18 0720) Resp:  [12-22] 22 (03/18 0720) BP: (102-131)/(47-82) 122/82 mmHg (03/18 0720) SpO2:  [96 %-100 %] 99 % (03/18 0720) Weight:  [95.8 kg (211 lb 3.2 oz)-97.8 kg (215 lb 9.8 oz)] 95.8 kg (211 lb 3.2 oz) (03/18 0600)  Weight change: 1.569 kg (3 lb 7.4 oz) Filed Weights   02/23/13 0800 02/23/13 1200 02/24/13 0600  Weight: 100 kg (220 lb 7.4 oz) 97.8 kg (215 lb 9.8 oz) 95.8 kg (211 lb 3.2 oz)    Intake/Output: I/O last 3 completed shifts: In: 1964 [P.O.:980; I.V.:284; Blood:700] Out: 3070 [Other:2700; Chest Tube:370]   Intake/Output this shift:  Total I/O In: 120 [P.O.:120] Out: -   awakw and alert    CVS- RRR RS- CTA ABD- BS present soft non-distended EXT- no edema  avf Small air leak   Basic Metabolic Panel:  Recent Labs Lab 02/17/13 1345  02/19/13 1457 02/20/13 0431 02/20/13 0539 02/20/13 0614 02/20/13 0800 02/21/13 0412 02/23/13 0450  NA 137  < > 138 136 135  --   --  135 135  K 4.8  < > 4.5 6.8* 6.5* 6.7*  --  4.6 4.3  CL 97  --   --  99  --   --   --  96 95*  CO2 24  --   --  26  --   --   --  30 28  GLUCOSE 80  < > 99 98 95  --   --  111* 101*  BUN 41*  --   --  46*  --   --   --  29* 55*  CREATININE 9.53*  --   --  9.78*  --   --   --  7.32* 11.83*  CALCIUM 10.3  --   --  9.2  --   --   --  9.3 9.5  PHOS  --   --   --   --   --   --  8.3*  --   --   < > = values in this interval not displayed.  Liver Function Tests:  Recent Labs Lab 02/17/13 1345 02/21/13 0412  AST 17 25  ALT <5 <5  ALKPHOS 82 61  BILITOT 0.3 0.2*  PROT 8.9* 7.0  ALBUMIN 2.2* 1.8*   No results found for this basename: LIPASE, AMYLASE,  in the last 168 hours No results found for this basename: AMMONIA,  in the last 168  hours  CBC:  Recent Labs Lab 02/19/13 1703 02/20/13 02/20/13 0431 02/20/13 0539 02/21/13 0412 02/23/13 0450  WBC 8.2 15.6* 13.5*  --  10.7* 6.9  HGB 7.9* 8.0* 7.7* 7.1* 7.4* 6.7*  HCT 23.8* 23.5* 23.8* 21.0* 22.2* 20.7*  MCV 86.9 85.5 87.5  --  88.4 90.0  PLT 205 245 238  --  227 202    Cardiac Enzymes: No results found for this basename: CKTOTAL, CKMB, CKMBINDEX, TROPONINI,  in the last 168 hours  BNP: No components found with this basename: POCBNP,   CBG:  Recent Labs Lab 02/20/13 0755  GLUCAP 86    Microbiology: Results for orders placed during the hospital encounter of 02/19/13  TISSUE CULTURE     Status: None  Collection Time    02/19/13 12:19 PM      Result Value Range Status   Specimen Description TISSUE PLEURAL RIGHT   Final   Special Requests RIGHT PLEURAL PEEL PATIENT ON FOLLOWING ZINACEF   Final   Gram Stain     Final   Value: FEW WBC PRESENT,BOTH PMN AND MONONUCLEAR     NO ORGANISMS SEEN   Culture PSEUDOMONAS STUTZERI   Final   Report Status 02/23/2013 FINAL   Final   Organism ID, Bacteria PSEUDOMONAS STUTZERI   Final    Coagulation Studies:  Recent Labs  02/22/13 0400 02/23/13 0450 02/24/13 0300  LABPROT 16.2* 14.7 15.3*  INR 1.33 1.17 1.23    Urinalysis: No results found for this basename: COLORURINE, APPERANCEUR, LABSPEC, PHURINE, GLUCOSEU, HGBUR, BILIRUBINUR, KETONESUR, PROTEINUR, UROBILINOGEN, NITRITE, LEUKOCYTESUR,  in the last 72 hours    Imaging: Dg Chest Port 1 View  02/24/2013  *RADIOLOGY REPORT*  Clinical Data: Evaluate chest tube position.  Status post thoracotomy.  PORTABLE CHEST - 1 VIEW  Comparison: Chest x-ray 02/23/2013.  Findings: One of the previously noted right-sided chest tubes has been removed.  The other remains stable in position with tip projecting over the upper right hemithorax.  Previously noted right- sided pneumothorax has slightly decreased in size and is now small. Moderate volume of complex likely  multilocular pleural fluid has increased.  Extensive architectural distortion and worsening opacities throughout the right lung.  Left lung appears well aerated.  No left pleural effusion or left pneumothorax.  Heart size is mildly enlarged.  Atherosclerosis in the thoracic aorta. Surgical clips are seen projecting over the lateral right chest wall with a small amount of subcutaneous emphysema in this region as well.  IMPRESSION: 1.  Support apparatus and postoperative changes, as above. 2.  Complex right-sided hydropneumothorax with increased volume of pleural fluid but decreased pneumothorax component. 3.  Slight worsening aeration throughout the right lung may reflect a combination of resolving postoperative edema/hemorrhage, developing scarring, and/or worsening atelectasis.   Original Report Authenticated By: Vinnie Langton, M.D.    Dg Chest Port 1 View  02/23/2013  *RADIOLOGY REPORT*  Clinical Data: Status post thoracoscopy, persistent air leak  PORTABLE CHEST - 1 VIEW  Comparison: 02/22/2013  Findings: Two chest tubes remain in place on the right.  A right- sided pneumothorax is again seen and stable from prior study. Postsurgical changes are noted. The left lung remains well aerated without focal infiltrate or sizable effusion.  A left-sided central venous line is stable.  The cardiac shadow is stable.  IMPRESSION: Postoperative changes on the right with stable pneumothorax.  No new focal abnormality is seen.   Original Report Authenticated By: Inez Catalina, M.D.      Medications:     . antiseptic oral rinse  15 mL Mouth Rinse q12n4p  . calcium acetate  2,001 mg Oral With snacks  . calcium acetate  2,668 mg Oral TID WC  . chlorhexidine  15 mL Mouth Rinse BID  . darbepoetin  100 mcg Intravenous Q7 days  . guaiFENesin  1,200 mg Oral BID  . levalbuterol  0.63 mg Nebulization TID  . multivitamin  1 tablet Oral QHS  . polyethylene glycol  17 g Oral Daily  . warfarin  2.5 mg Oral Q M,W,F-1800   . warfarin  5 mg Oral Q T,Th,S,Su-1800  . Warfarin - Physician Dosing Inpatient   Does not apply q1800   sodium chloride, sodium chloride, calcium carbonate, feeding supplement (NEPRO CARB STEADY),  heparin, heparin, lidocaine (PF), lidocaine-prilocaine, ondansetron (ZOFRAN) IV, oxyCODONE-acetaminophen, pentafluoroprop-tetrafluoroeth, potassium chloride, senna-docusate, white petrolatum  Assessment/ Plan:  1. S/P VATS w repeat decortication and surgical repair of air leak 3/14 2. ESRD, cont mwf HD 3. HTN/volume-stable.  4. Anemia of CKD- darbe 100ug/Fri, transfused 5. Secondary HPTH- adj Ca high, not on vit D at centeron phoslo only as binder. Phos high, will keep binders as is for now.   Plan dialysis in AM   LOS: 5 Moraima Burd W @TODAY @9 :51 AM

## 2013-02-25 ENCOUNTER — Inpatient Hospital Stay (HOSPITAL_COMMUNITY): Payer: Medicare Other

## 2013-02-25 LAB — RENAL FUNCTION PANEL
CO2: 31 mEq/L (ref 19–32)
Chloride: 96 mEq/L (ref 96–112)
GFR calc Af Amer: 6 mL/min — ABNORMAL LOW (ref >90)
Glucose, Bld: 99 mg/dL (ref 70–99)
Phosphorus: 5.3 mg/dL — ABNORMAL HIGH (ref 2.3–4.6)
Potassium: 4.7 mEq/L (ref 3.5–5.1)
Sodium: 136 mEq/L (ref 135–145)

## 2013-02-25 LAB — CBC
HCT: 25.4 % — ABNORMAL LOW (ref 39.0–52.0)
Hemoglobin: 8 g/dL — ABNORMAL LOW (ref 13.0–17.0)
RBC: 2.72 MIL/uL — ABNORMAL LOW (ref 4.22–5.81)
WBC: 7.3 10*3/uL (ref 4.0–10.5)

## 2013-02-25 LAB — PROTIME-INR
INR: 1.25 (ref 0.00–1.49)
Prothrombin Time: 15.5 seconds — ABNORMAL HIGH (ref 11.6–15.2)

## 2013-02-25 MED ORDER — CEFEPIME HCL 2 G IJ SOLR
2.0000 g | INTRAMUSCULAR | Status: DC
  Administered 2013-02-27 – 2013-03-04 (×3): 2 g via INTRAVENOUS
  Filled 2013-02-25 (×6): qty 2

## 2013-02-25 MED ORDER — DEXTROSE 5 % IV SOLN: 1.0000 g | Freq: Two times a day (BID) | INTRAVENOUS | Status: DC

## 2013-02-25 MED ORDER — LANTHANUM CARBONATE 500 MG PO CHEW
1000.0000 mg | CHEWABLE_TABLET | Freq: Three times a day (TID) | ORAL | Status: DC
  Administered 2013-02-25 – 2013-03-06 (×21): 1000 mg via ORAL
  Filled 2013-02-25 (×31): qty 2

## 2013-02-25 MED ORDER — DEXTROSE 5 % IV SOLN
1.0000 g | INTRAVENOUS | Status: AC
  Administered 2013-02-25: 1 g via INTRAVENOUS
  Filled 2013-02-25: qty 1

## 2013-02-25 MED ORDER — CEFEPIME HCL 2 G IJ SOLR: 500.0000 mg | INTRAMUSCULAR | Status: DC

## 2013-02-25 MED ORDER — DEXTROSE 5 % IV SOLN
1.0000 g | Freq: Once | INTRAVENOUS | Status: AC
  Administered 2013-02-25: 1 g via INTRAVENOUS
  Filled 2013-02-25: qty 1

## 2013-02-25 MED ORDER — DARBEPOETIN ALFA-POLYSORBATE 100 MCG/0.5ML IJ SOLN
100.0000 ug | INTRAMUSCULAR | Status: DC
  Filled 2013-02-25 (×2): qty 0.5

## 2013-02-25 MED ORDER — LANTHANUM CARBONATE 500 MG PO CHEW
500.0000 mg | CHEWABLE_TABLET | ORAL | Status: DC
  Administered 2013-02-26 – 2013-02-27 (×3): 500 mg via ORAL
  Filled 2013-02-25 (×11): qty 1

## 2013-02-25 NOTE — Progress Notes (Signed)
Utilization review completed.  

## 2013-02-25 NOTE — Progress Notes (Signed)
6 Days Post-Op Procedure(s) (LRB): VIDEO BRONCHOSCOPY WITH INSERTION OF INTERBRONCHIAL VALVE (IBV) (Right) VIDEO ASSISTED THORACOSCOPY (VATS)/THOROCOTOMY (Right) Subjective: Feels pretty well overall this AM Some incisional pain  Objective: Vital signs in last 24 hours: Temp:  [97.5 F (36.4 C)-99.4 F (37.4 C)] 98.6 F (37 C) (03/19 0730) Pulse Rate:  [92-96] 92 (03/19 0400) Cardiac Rhythm:  [-] Normal sinus rhythm (03/19 0400) Resp:  [12-20] 14 (03/19 0400) BP: (127-146)/(70-91) 127/70 mmHg (03/19 0400) SpO2:  [97 %-100 %] 100 % (03/19 0400)  Hemodynamic parameters for last 24 hours:    Intake/Output from previous day: 03/18 0701 - 03/19 0700 In: 720 [P.O.:720] Out: 100 [Chest Tube:100] Intake/Output this shift:    General appearance: alert and no distress Neurologic: intact Heart: regular rate and rhythm Lungs: wheezes RLL large tidal variation with cough- no definite air leak  Lab Results:  Recent Labs  02/23/13 0450 02/25/13 0320  WBC 6.9 7.3  HGB 6.7* 8.0*  HCT 20.7* 25.4*  PLT 202 193   BMET:  Recent Labs  02/23/13 0450 02/25/13 0320  NA 135 136  K 4.3 4.7  CL 95* 96  CO2 28 31  GLUCOSE 101* 99  BUN 55* 44*  CREATININE 11.83* 10.16*  CALCIUM 9.5 10.2    PT/INR:  Recent Labs  02/25/13 0320  LABPROT 15.5*  INR 1.25   ABG    Component Value Date/Time   PHART 7.377 02/20/2013 0425   HCO3 27.5* 02/20/2013 0425   TCO2 29 02/20/2013 0425   O2SAT 93.0 02/20/2013 0425   CBG (last 3)  No results found for this basename: GLUCAP,  in the last 72 hours  MICRO Results for orders placed during the hospital encounter of 02/19/13  TISSUE CULTURE     Status: None   Collection Time    02/19/13 12:19 PM      Result Value Range Status   Specimen Description TISSUE PLEURAL RIGHT   Final   Special Requests RIGHT PLEURAL PEEL PATIENT ON FOLLOWING ZINACEF   Final   Gram Stain     Final   Value: FEW WBC PRESENT,BOTH PMN AND MONONUCLEAR     NO ORGANISMS  SEEN   Culture PSEUDOMONAS STUTZERI   Final   Report Status 02/23/2013 FINAL   Final   Organism ID, Bacteria PSEUDOMONAS STUTZERI   Final    Assessment/Plan: S/P Procedure(s) (LRB): VIDEO BRONCHOSCOPY WITH INSERTION OF INTERBRONCHIAL VALVE (IBV) (Right) VIDEO ASSISTED THORACOSCOPY (VATS)/THOROCOTOMY (Right) - No air leak at present will leave tube in place on water seal CXR change appears to primarily be due to drainage of fluid with the lung volume little changed Growing pseudomonas from cultures- will start maxipime- have pharmacy adjust dosage HD today ambulate   LOS: 6 days    Baldo Hufnagle C 02/25/2013

## 2013-02-25 NOTE — Progress Notes (Signed)
[facility] ROUNDING NOTE   Subjective:   Interval History: no complaints  Objective:  Vital signs in last 24 hours:  Temp:  [97.5 F (36.4 C)-99.4 F (37.4 C)] 98.6 F (37 C) (03/19 0730) Pulse Rate:  [92-96] 92 (03/19 0400) Resp:  [12-20] 14 (03/19 0400) BP: (127-146)/(70-91) 127/70 mmHg (03/19 0400) SpO2:  [97 %-100 %] 100 % (03/19 0400)  Weight change:  Filed Weights   02/23/13 0800 02/23/13 1200 02/24/13 0600  Weight: 100 kg (220 lb 7.4 oz) 97.8 kg (215 lb 9.8 oz) 95.8 kg (211 lb 3.2 oz)    Intake/Output: I/O last 3 completed shifts: In: 960 [P.O.:960] Out: 300 [Chest Tube:300]   Intake/Output this shift:     General appearance: alert and no distress  Heart: regular rate and rhythm  Lungs: wheezes RLL  large tidal variation with cough- no definite air leak AVF thrill and bruit No lower extremity edema    Basic Metabolic Panel:  Recent Labs Lab 02/20/13 0431 02/20/13 0539 02/20/13 0614 02/20/13 0800 02/21/13 0412 02/23/13 0450 02/25/13 0320  NA 136 135  --   --  135 135 136  K 6.8* 6.5* 6.7*  --  4.6 4.3 4.7  CL 99  --   --   --  96 95* 96  CO2 26  --   --   --  30 28 31   GLUCOSE 98 95  --   --  111* 101* 99  BUN 46*  --   --   --  29* 55* 44*  CREATININE 9.78*  --   --   --  7.32* 11.83* 10.16*  CALCIUM 9.2  --   --   --  9.3 9.5 10.2  PHOS  --   --   --  8.3*  --   --  5.3*    Liver Function Tests:  Recent Labs Lab 02/21/13 0412 02/25/13 0320  AST 25  --   ALT <5  --   ALKPHOS 61  --   BILITOT 0.2*  --   PROT 7.0  --   ALBUMIN 1.8* 2.1*   No results found for this basename: LIPASE, AMYLASE,  in the last 168 hours No results found for this basename: AMMONIA,  in the last 168 hours  CBC:  Recent Labs Lab 02/20/13 02/20/13 0431 02/20/13 0539 02/21/13 0412 02/23/13 0450 02/25/13 0320  WBC 15.6* 13.5*  --  10.7* 6.9 7.3  HGB 8.0* 7.7* 7.1* 7.4* 6.7* 8.0*  HCT 23.5* 23.8* 21.0* 22.2* 20.7* 25.4*  MCV 85.5 87.5   --  88.4 90.0 93.4  PLT 245 238  --  227 202 193    Cardiac Enzymes: No results found for this basename: CKTOTAL, CKMB, CKMBINDEX, TROPONINI,  in the last 168 hours  BNP: No components found with this basename: POCBNP,   CBG:  Recent Labs Lab 02/20/13 0755  GLUCAP 86    Microbiology: Results for orders placed during the hospital encounter of 02/19/13  TISSUE CULTURE     Status: None   Collection Time    02/19/13 12:19 PM      Result Value Range Status   Specimen Description TISSUE PLEURAL RIGHT   Final   Special Requests RIGHT PLEURAL PEEL PATIENT ON FOLLOWING ZINACEF   Final   Gram Stain     Final   Value: FEW WBC PRESENT,BOTH PMN AND MONONUCLEAR     NO ORGANISMS SEEN   Culture PSEUDOMONAS STUTZERI   Final   Report  Status 02/23/2013 FINAL   Final   Organism ID, Bacteria PSEUDOMONAS STUTZERI   Final    Coagulation Studies:  Recent Labs  02/23/13 0450 02/24/13 0300 02/25/13 0320  LABPROT 14.7 15.3* 15.5*  INR 1.17 1.23 1.25    Urinalysis: No results found for this basename: COLORURINE, APPERANCEUR, LABSPEC, PHURINE, GLUCOSEU, HGBUR, BILIRUBINUR, KETONESUR, PROTEINUR, UROBILINOGEN, NITRITE, LEUKOCYTESUR,  in the last 72 hours    Imaging: Dg Chest Port 1 View  02/25/2013  *RADIOLOGY REPORT*  Clinical Data: Evaluate chest tube.  Pleural effusion.  PORTABLE CHEST - 1 VIEW  Comparison: Chest x-ray 02/24/2013.  Findings: The previously noted right-sided chest tube remains similarly positioned with tip in the upper right hemithorax. Compared to the prior examination there has been interval development of a moderate right-sided pneumothorax, in addition to the chronic right-sided pleural effusion and extensive pleuroparenchymal scarring.  Aeration has worsened throughout the right lung, which now appears partially collapsed.  There is persistent architectural distortion throughout the right lung. Left lung is well-aerated.  No evidence of pulmonary edema.  Heart size appears  upper limits of normal.  Atherosclerosis in the thoracic aorta.  IMPRESSION: 1. Interval development of moderate right-sided hydropneumothorax. 2.  Worsening aeration in the right lung, compatible with increasing atelectasis superimposed on a background of underlying chronic scarring and rounded atelectasis. 3.  Atherosclerosis.  These results will be called to the ordering clinician or representative by the Radiologist Assistant, and communication documented in the PACS Dashboard.   Original Report Authenticated By: Vinnie Langton, M.D.    Dg Chest Port 1 View  02/24/2013  *RADIOLOGY REPORT*  Clinical Data: Evaluate chest tube position.  Status post thoracotomy.  PORTABLE CHEST - 1 VIEW  Comparison: Chest x-ray 02/23/2013.  Findings: One of the previously noted right-sided chest tubes has been removed.  The other remains stable in position with tip projecting over the upper right hemithorax.  Previously noted right- sided pneumothorax has slightly decreased in size and is now small. Moderate volume of complex likely multilocular pleural fluid has increased.  Extensive architectural distortion and worsening opacities throughout the right lung.  Left lung appears well aerated.  No left pleural effusion or left pneumothorax.  Heart size is mildly enlarged.  Atherosclerosis in the thoracic aorta. Surgical clips are seen projecting over the lateral right chest wall with a small amount of subcutaneous emphysema in this region as well.  IMPRESSION: 1.  Support apparatus and postoperative changes, as above. 2.  Complex right-sided hydropneumothorax with increased volume of pleural fluid but decreased pneumothorax component. 3.  Slight worsening aeration throughout the right lung may reflect a combination of resolving postoperative edema/hemorrhage, developing scarring, and/or worsening atelectasis.   Original Report Authenticated By: Vinnie Langton, M.D.      Medications:     . antiseptic oral rinse  15 mL  Mouth Rinse q12n4p  . calcium acetate  2,001 mg Oral With snacks  . calcium acetate  2,668 mg Oral TID WC  . ceFEPime (MAXIPIME) IV  1 g Intravenous STAT  . ceFEPime (MAXIPIME) IV  1 g Intravenous Once  . [START ON 02/27/2013] ceFEPime (MAXIPIME) IV  2 g Intravenous Q M,W,F-2000  . chlorhexidine  15 mL Mouth Rinse BID  . [START ON 02/27/2013] darbepoetin  100 mcg Intravenous Q Fri-HD  . guaiFENesin  1,200 mg Oral BID  . levalbuterol  0.63 mg Nebulization BID  . multivitamin  1 tablet Oral QHS  . polyethylene glycol  17 g Oral Daily  . warfarin  2.5 mg Oral Q M,W,F-1800  . warfarin  5 mg Oral Q T,Th,S,Su-1800  . Warfarin - Physician Dosing Inpatient   Does not apply q1800   sodium chloride, sodium chloride, sodium chloride, sodium chloride, sodium chloride, sodium chloride, calcium carbonate, feeding supplement (NEPRO CARB STEADY), feeding supplement (NEPRO CARB STEADY), heparin, heparin, lidocaine (PF), lidocaine-prilocaine, lidocaine-prilocaine, ondansetron (ZOFRAN) IV, oxyCODONE-acetaminophen, pentafluoroprop-tetrafluoroeth, potassium chloride, senna-docusate, white petrolatum  Assessment/ Plan:  1. S/P VATS w repeat decortication and surgical repair of air leak 3/14 2. ESRD, cont mwf HD 3. HTN/volume-stable.  4. Anemia of CKD- darbe 100ug/Fri, transfused 5. Secondary HPTH- phosphorus control improved  Plan dialysis in today    LOS: 6 [name] [name] W @TODAY @9 :56 AM

## 2013-02-25 NOTE — Progress Notes (Signed)
ANTIBIOTIC CONSULT NOTE - INITIAL  Pharmacy Consult for Cefepime Indication: Pseudomonas infection of R pleural tissue  Allergies  Allergen Reactions  . Tape Other (See Comments)    blister    Patient Measurements: Height: 6\' 1"  (185.4 cm) Weight: 211 lb 3.2 oz (95.8 kg) IBW/kg (Calculated) : 79.9  Vital Signs: Temp: 98.6 F (37 C) (03/19 0730) Temp src: Oral (03/19 0730) BP: 127/70 mmHg (03/19 0400) Pulse Rate: 92 (03/19 0400) Intake/Output from previous day: 03/18 0701 - 03/19 0700 In: 720 [P.O.:720] Out: 100 [Chest Tube:100] Intake/Output from this shift:    Labs:  Recent Labs  02/23/13 0450 02/25/13 0320  WBC 6.9 7.3  HGB 6.7* 8.0*  PLT 202 193  CREATININE 11.83* 10.16*    Microbiology: Recent Results (from the past 720 hour(s))  SURGICAL PCR SCREEN     Status: None   Collection Time    02/17/13  1:44 PM      Result Value Range Status   MRSA, PCR NEGATIVE  NEGATIVE Final   Staphylococcus aureus NEGATIVE  NEGATIVE Final   Comment:            The Xpert SA Assay (FDA     approved for NASAL specimens     in patients over 92 years of age),     is one component of     a comprehensive surveillance     program.  Test performance has     been validated by [employer] for patients greater     than or equal to 22 year old.     It is not intended     to diagnose infection nor to     guide or monitor treatment.  TISSUE CULTURE     Status: None   Collection Time    02/19/13 12:19 PM      Result Value Range Status   Specimen Description TISSUE PLEURAL RIGHT   Final   Special Requests RIGHT PLEURAL PEEL PATIENT ON FOLLOWING ZINACEF   Final   Gram Stain     Final   Value: FEW WBC PRESENT,BOTH PMN AND MONONUCLEAR     NO ORGANISMS SEEN   Culture PSEUDOMONAS STUTZERI   Final   Report Status 02/23/2013 FINAL   Final   Organism ID, Bacteria PSEUDOMONAS STUTZERI   Final    Assessment: 52 y.o. M with ESRD s/p VATS/thoracotomy, decortication, and suture  repair of air leak along with removal of intrabronchial valves of the RLL on 3/14. R-pleural tissue cultures grew Pseudomonas stutzeri (S-Cefepime/gent/imi, R-Ceftaz/cipro/tobra) and pharmacy was consulted to start Cefepime for empiric coverage. The patient will go to hemodialysis at some point today -- will go ahead and give the patient a dose this morning to start treatment, with a supplemental dose this evening, and plan to start post-HD dosing on Friday, 3/21.   Goal of Therapy:  Proper antibiotics for infection/cultures adjusted for renal/hepatic function   Plan:  1. Cefepime 1g IV now followed by 1g IV post-HD later today 2. Start Cefepime 2g post HD sessions on MWF on Friday, 3/21. 3. Will continue to follow HD schedule/duration, culture results, LOT, and antibiotic de-escalation plans   Alycia Rossetti, PharmD, BCPS Clinical Pharmacist Pager: [phone] 02/25/2013 9:52 AM

## 2013-02-25 NOTE — Brief Op Note (Signed)
02/05/2013   PROCEDURE  Talc pleurodesis performed with slurry of talc, lidocaine and saline prepared by pharmacy  Patient tolerated well and will be discharged home today

## 2013-02-26 ENCOUNTER — Inpatient Hospital Stay (HOSPITAL_COMMUNITY): Payer: Medicare Other

## 2013-02-26 LAB — PROTIME-INR
INR: 1.33 (ref 0.00–1.49)
Prothrombin Time: 16.2 seconds — ABNORMAL HIGH (ref 11.6–15.2)

## 2013-02-26 MED ORDER — LEVALBUTEROL HCL 0.63 MG/3ML IN NEBU: 0.6300 mg | INHALATION_SOLUTION | Freq: Three times a day (TID) | RESPIRATORY_TRACT | Status: DC | PRN

## 2013-02-26 NOTE — Op Note (Signed)
NAMENEAL, FOERST                 ACCOUNT NO.:  [id number]  MEDICAL RECORD NO.:  [id number]  LOCATION:  [facility]                         FACILITY:  [facility]  PHYSICIAN:  Revonda Standard. Roxan Hockey, M.D.DATE OF BIRTH:  06-09-1961  DATE OF PROCEDURE:  02/05/2013 DATE OF DISCHARGE:  02/05/2013                              OPERATIVE REPORT   PREOPERATIVE DIAGNOSIS:  Persistent air leak.  POSTOPERATIVE DIAGNOSIS:  Persistent air leak.  PROCEDURE:  Talc pleurodesis via indwelling chest tube.  SURGEON:  Revonda Standard. Roxan Hockey, MD  ANESTHESIA:  None.  The patient tolerated well and will be discharged home today.  PROCEDURE NOTE:  Using sterile technique, talc pleurodesis was performed with a slurry of talc lidocaine and saline which had been prepared by pharmacy.  The patient tolerated the procedure well.  The tube will be left clamped for 1 hour and then the patient will be discharged home later today.     Revonda Standard Roxan Hockey, M.D.     SCH/MEDQ  D:  02/25/2013  T:  02/26/2013  Job:  [id number]

## 2013-02-26 NOTE — Progress Notes (Signed)
[facility] ROUNDING NOTE   Subjective:   Interval History:none  Objective:  Vital signs in last 24 hours:  Temp:  [97.6 F (36.4 C)-99.3 F (37.4 C)] 98.8 F (37.1 C) (03/20 0800) Pulse Rate:  [86-123] 104 (03/20 0800) Resp:  [11-22] 14 (03/20 0800) BP: (110-149)/(58-86) 114/66 mmHg (03/20 0800) SpO2:  [95 %-100 %] 100 % (03/20 0800) Weight:  [93.8 kg (206 lb 12.7 oz)-96.4 kg (212 lb 8.4 oz)] 93.8 kg (206 lb 12.7 oz) (03/19 1621)  Weight change:  Filed Weights   02/24/13 0600 02/25/13 1240 02/25/13 1621  Weight: 95.8 kg (211 lb 3.2 oz) 96.4 kg (212 lb 8.4 oz) 93.8 kg (206 lb 12.7 oz)    Intake/Output: I/O last 3 completed shifts: In: [id number] [P.O.:1560] Out: 2970 [Other:2950; Chest Tube:20]   Intake/Output this shift:     General appearance: alert and no distress  Heart: regular rate and rhythm  Lungs: wheezes RLL  large tidal variation with cough- no definite air leak  AVF thrill and bruit  No lower extremity edema    Basic Metabolic Panel:  Recent Labs Lab 02/20/13 0431 02/20/13 0539 02/20/13 0614 02/20/13 0800 02/21/13 0412 02/23/13 0450 02/25/13 0320  NA 136 135  --   --  135 135 136  K 6.8* 6.5* 6.7*  --  4.6 4.3 4.7  CL 99  --   --   --  96 95* 96  CO2 26  --   --   --  30 28 31   GLUCOSE 98 95  --   --  111* 101* 99  BUN 46*  --   --   --  29* 55* 44*  CREATININE 9.78*  --   --   --  7.32* 11.83* 10.16*  CALCIUM 9.2  --   --   --  9.3 9.5 10.2  PHOS  --   --   --  8.3*  --   --  5.3*    Liver Function Tests:  Recent Labs Lab 02/21/13 0412 02/25/13 0320  AST 25  --   ALT <5  --   ALKPHOS 61  --   BILITOT 0.2*  --   PROT 7.0  --   ALBUMIN 1.8* 2.1*   No results found for this basename: LIPASE, AMYLASE,  in the last 168 hours No results found for this basename: AMMONIA,  in the last 168 hours  CBC:  Recent Labs Lab 02/20/13 02/20/13 0431 02/20/13 0539 02/21/13 0412 02/23/13 0450 02/25/13 0320  WBC 15.6* 13.5*  --   10.7* 6.9 7.3  HGB 8.0* 7.7* 7.1* 7.4* 6.7* 8.0*  HCT 23.5* 23.8* 21.0* 22.2* 20.7* 25.4*  MCV 85.5 87.5  --  88.4 90.0 93.4  PLT 245 238  --  227 202 193    Cardiac Enzymes: No results found for this basename: CKTOTAL, CKMB, CKMBINDEX, TROPONINI,  in the last 168 hours  BNP: No components found with this basename: POCBNP,   CBG:  Recent Labs Lab 02/20/13 0755  GLUCAP 86    Microbiology: Results for orders placed during the hospital encounter of 02/19/13  TISSUE CULTURE     Status: None   Collection Time    02/19/13 12:19 PM      Result Value Range Status   Specimen Description TISSUE PLEURAL RIGHT   Final   Special Requests RIGHT PLEURAL PEEL PATIENT ON FOLLOWING ZINACEF   Final   Gram Stain     Final   Value: FEW WBC  PRESENT,BOTH PMN AND MONONUCLEAR     NO ORGANISMS SEEN   Culture PSEUDOMONAS STUTZERI   Final   Report Status 02/23/2013 FINAL   Final   Organism ID, Bacteria PSEUDOMONAS STUTZERI   Final    Coagulation Studies:  Recent Labs  02/24/13 0300 02/25/13 0320 02/26/13 0340  LABPROT 15.3* 15.5* 16.2*  INR 1.23 1.25 1.33    Urinalysis: No results found for this basename: COLORURINE, APPERANCEUR, LABSPEC, PHURINE, GLUCOSEU, HGBUR, BILIRUBINUR, KETONESUR, PROTEINUR, UROBILINOGEN, NITRITE, LEUKOCYTESUR,  in the last 72 hours    Imaging: Dg Chest Port 1 View  02/26/2013  *RADIOLOGY REPORT*  Clinical Data: Chest tube evaluation.  PORTABLE CHEST - 1 VIEW  Comparison: Chest x-ray 02/25/2013.  Findings: Increasing moderate right-sided pneumothorax despite the presence of the right-sided chest tube which appears unchanged in position with tip in the upper right hemithorax.  Persistent moderate volume of right pleural fluid.  Extensive architectural distortion and atelectasis throughout the right lung which appears to further collapse when compared with yesterday's examination. There is some increasing cephalization of the pulmonary vasculature in the left lung,  without frank pulmonary edema.  Heart size is upper limits of normal.  Atherosclerosis in the thoracic aorta. Surgical staples project over the right chest wall, there is a small amount of subcutaneous gas in the right chest wall.  IMPRESSION: 1.  Compared with yesterday's examination there is increasing moderate right hydropneumothorax and further collapse of the right lung.  Right-sided chest tube is unchanged in position. 2.  Cephalization of pulmonary vasculature, without frank pulmonary edema.  These results were called by telephone on 02/26/2013 at 08:35 a.m. to Dr. Roxan Hockey, who verbally acknowledged these results.   Original Report Authenticated By: Vinnie Langton, M.D.    Dg Chest Port 1 View  02/25/2013  *RADIOLOGY REPORT*  Clinical Data: Evaluate chest tube.  Pleural effusion.  PORTABLE CHEST - 1 VIEW  Comparison: Chest x-ray 02/24/2013.  Findings: The previously noted right-sided chest tube remains similarly positioned with tip in the upper right hemithorax. Compared to the prior examination there has been interval development of a moderate right-sided pneumothorax, in addition to the chronic right-sided pleural effusion and extensive pleuroparenchymal scarring.  Aeration has worsened throughout the right lung, which now appears partially collapsed.  There is persistent architectural distortion throughout the right lung. Left lung is well-aerated.  No evidence of pulmonary edema.  Heart size appears upper limits of normal.  Atherosclerosis in the thoracic aorta.  IMPRESSION: 1. Interval development of moderate right-sided hydropneumothorax. 2.  Worsening aeration in the right lung, compatible with increasing atelectasis superimposed on a background of underlying chronic scarring and rounded atelectasis. 3.  Atherosclerosis.  These results will be called to the ordering clinician or representative by the Radiologist Assistant, and communication documented in the PACS Dashboard.   Original Report  Authenticated By: Vinnie Langton, M.D.      Medications:     . antiseptic oral rinse  15 mL Mouth Rinse q12n4p  . [START ON 02/27/2013] ceFEPime (MAXIPIME) IV  2 g Intravenous Q M,W,F-2000  . chlorhexidine  15 mL Mouth Rinse BID  . [START ON 02/27/2013] darbepoetin  100 mcg Intravenous Q Fri-HD  . guaiFENesin  1,200 mg Oral BID  . lanthanum  1,000 mg Oral TID WC  . lanthanum  500 mg Oral With snacks  . multivitamin  1 tablet Oral QHS  . polyethylene glycol  17 g Oral Daily  . warfarin  2.5 mg Oral Q M,W,F-1800  . warfarin  5 mg Oral Q T,Th,S,Su-1800  . Warfarin - Physician Dosing Inpatient   Does not apply q1800   sodium chloride, sodium chloride, calcium carbonate, feeding supplement (NEPRO CARB STEADY), heparin, levalbuterol, lidocaine-prilocaine, ondansetron (ZOFRAN) IV, oxyCODONE-acetaminophen, pentafluoroprop-tetrafluoroeth, potassium chloride, senna-docusate, white petrolatum  Assessment/ Plan:  1. S/P VATS w repeat decortication and surgical repair of air leak 3/14 2. ESRD, cont mwf HD 3. HTN/volume-stable.  4. Anemia of CKD- darbe 100ug/Fri, transfused 5. Secondary HPTH- phosphorus control improved  Plan dialysis in tomorrow   LOS: 7 Paden Kuras W @TODAY @10 :33 AM

## 2013-02-26 NOTE — Progress Notes (Signed)
7 Days Post-Op Procedure(s) (LRB): VIDEO BRONCHOSCOPY WITH INSERTION OF INTERBRONCHIAL VALVE (IBV) (Right) VIDEO ASSISTED THORACOSCOPY (VATS)/THOROCOTOMY (Right) Subjective:   [name] has several questions this morning.  He is concerned that there are still bubbles in his chest tube and he wonders if they will ever go away.  He is concerned that if they don't will he have to have surgery again.  Finally the patient does not want to take the chewable phosphorus supplements.  He is questioning why is not on his phoslo regimen he takes at home.  Objective: Vital signs in last 24 hours: Temp:  [97.6 F (36.4 C)-99.3 F (37.4 C)] 98.7 F (37.1 C) (03/20 0437) Pulse Rate:  [86-123] 94 (03/20 0437) Cardiac Rhythm:  [-] Normal sinus rhythm (03/19 2000) Resp:  [11-22] 11 (03/20 0437) BP: (110-149)/(58-86) 120/74 mmHg (03/20 0437) SpO2:  [95 %-100 %] 100 % (03/20 0437) Weight:  [206 lb 12.7 oz (93.8 kg)-212 lb 8.4 oz (96.4 kg)] 206 lb 12.7 oz (93.8 kg) (03/19 1621)  Intake/Output from previous day: 03/19 0701 - 03/20 0700 In: 1080 [P.O.:1080] Out: 2970 [Chest Tube:20]  General appearance: alert, cooperative and no distress Heart: regular rate and rhythm Lungs: wheezes RLL Abdomen: soft, non-tender; bowel sounds normal; no masses,  no organomegaly Wound: clean and dry  Lab Results:  Recent Labs  02/25/13 0320  WBC 7.3  HGB 8.0*  HCT 25.4*  PLT 193   BMET:  Recent Labs  02/25/13 0320  NA 136  K 4.7  CL 96  CO2 31  GLUCOSE 99  BUN 44*  CREATININE 10.16*  CALCIUM 10.2    PT/INR:  Recent Labs  02/26/13 0340  LABPROT 16.2*  INR 1.33   ABG    Component Value Date/Time   PHART 7.377 02/20/2013 0425   HCO3 27.5* 02/20/2013 0425   TCO2 29 02/20/2013 0425   O2SAT 93.0 02/20/2013 0425   CBG (last 3)  No results found for this basename: GLUCAP,  in the last 72 hours  Assessment/Plan: S/P Procedure(s) (LRB): VIDEO BRONCHOSCOPY WITH INSERTION OF INTERBRONCHIAL VALVE  (IBV) (Right) VIDEO ASSISTED THORACOSCOPY (VATS)/THOROCOTOMY (Right)  1. Chest tube- on water seal, + air leak this morning, CXR shows increasing hydropneumothorax on the right 2. + Pseudomonas from wound cultures- on Maxipime dosing per pharmacy 3. ESRD- on Dialysis, Nephrology following 4.  Continue current care   LOS: 7 days    Ellwood Handler 02/26/2013

## 2013-02-27 ENCOUNTER — Inpatient Hospital Stay (HOSPITAL_COMMUNITY): Payer: Medicare Other

## 2013-02-27 LAB — RENAL FUNCTION PANEL
Albumin: 2 g/dL — ABNORMAL LOW (ref 3.5–5.2)
BUN: 46 mg/dL — ABNORMAL HIGH (ref 6–23)
Chloride: 96 mEq/L (ref 96–112)
GFR calc non Af Amer: 5 mL/min — ABNORMAL LOW (ref >90)
Phosphorus: 5.1 mg/dL — ABNORMAL HIGH (ref 2.3–4.6)
Potassium: 4.6 mEq/L (ref 3.5–5.1)
Sodium: 137 mEq/L (ref 135–145)

## 2013-02-27 LAB — CBC
MCH: 29.2 pg (ref 26.0–34.0)
MCV: 90.3 fL (ref 78.0–100.0)
Platelets: 197 10*3/uL (ref 150–400)
RBC: 2.77 MIL/uL — ABNORMAL LOW (ref 4.22–5.81)
RDW: 16.6 % — ABNORMAL HIGH (ref 11.5–15.5)
WBC: 6.6 10*3/uL (ref 4.0–10.5)

## 2013-02-27 LAB — PROTIME-INR
INR: 1.31 (ref 0.00–1.49)
Prothrombin Time: 16 seconds — ABNORMAL HIGH (ref 11.6–15.2)

## 2013-02-27 MED ORDER — DARBEPOETIN ALFA-POLYSORBATE 100 MCG/0.5ML IJ SOLN
INTRAMUSCULAR | Status: AC
  Administered 2013-02-27: 100 ug via INTRAVENOUS
  Filled 2013-02-27: qty 0.5

## 2013-02-27 MED ORDER — OXYCODONE-ACETAMINOPHEN 5-325 MG PO TABS
ORAL_TABLET | ORAL | Status: AC
  Administered 2013-02-27: 2 via ORAL
  Filled 2013-02-27: qty 2

## 2013-02-27 NOTE — Progress Notes (Signed)
                   [address]            [place] [address]          [phone]    8 Days Post-Op Procedure(s) (LRB): VIDEO BRONCHOSCOPY WITH INSERTION OF INTERBRONCHIAL VALVE (IBV) (Right) VIDEO ASSISTED THORACOSCOPY (VATS)/THOROCOTOMY (Right)  Subjective: Patient has some pain at incision sites. He did have a bowel movement  Objective: Vital signs in last 24 hours: Temp:  [97.9 F (36.6 C)-98.8 F (37.1 C)] 98.8 F (37.1 C) (03/21 1130) Pulse Rate:  [62-108] 108 (03/21 1130) Cardiac Rhythm:  [-] Normal sinus rhythm (03/21 1130) Resp:  [13-29] 15 (03/21 1130) BP: (118-154)/(70-89) 118/75 mmHg (03/21 1130) SpO2:  [96 %-99 %] 98 % (03/21 1130) Weight:  [92.8 kg (204 lb 9.4 oz)-95.4 kg (210 lb 5.1 oz)] 92.8 kg (204 lb 9.4 oz) (03/21 1030)     Intake/Output from previous day: 03/20 0701 - 03/21 0700 In: 840 [P.O.:840] Out: 160 [Chest Tube:160]   Physical Exam:  Cardiovascular: RRR Pulmonary: Clear to auscultation on left; diminished breath sounds on right; no rales, wheezes, or rhonchi. Abdomen: Soft, non tender, bowel sounds present. Extremities: Mild bilateral lower extremity edema. Wounds: Clean and dry.  No erythema or signs of infection. Chest Tube: on water seal, +3 air leak with cough  Lab Results: CBC: Recent Labs  02/25/13 0320 02/27/13 0709  WBC 7.3 6.6  HGB 8.0* 8.1*  HCT 25.4* 25.0*  PLT 193 197   BMET:  Recent Labs  02/25/13 0320 02/27/13 0709  NA 136 137  K 4.7 4.6  CL 96 96  CO2 31 26  GLUCOSE 99 96  BUN 44* 46*  CREATININE 10.16* 10.45*  CALCIUM 10.2 10.0    PT/INR:  Recent Labs  02/27/13 0315  LABPROT 16.0*  INR 1.31   ABG:  INR: Will add last result for INR, ABG once components are confirmed Will add last 4 CBG results once components are confirmed  Assessment/Plan:  1. CV - ST/SR. 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  Pulmonary - Had 160 cc of output from the chest  tubes.There is still an air leak.Chest tube to remain for now. CXR this am appears stable (moderate right hydropneumothorax, left lung well aerated). Pseudomonas from cultures of right pleural tissue-continue Maxipime. 3.Anemia-H and H 8.1 and 25. On Aranesp 4.CKD-Had HD earlier today 5.Continue present management  Maleigh Bagot MPA-C 02/27/2013,2:51 PM

## 2013-02-27 NOTE — Procedures (Signed)
I have seen and examined this patient and agree with the plan of care. Seen on dialysis. Tolerating well BP 120/70  No edema or sob Jourdain Guay W 02/27/2013, 8:30 AM

## 2013-02-28 ENCOUNTER — Inpatient Hospital Stay (HOSPITAL_COMMUNITY): Payer: Medicare Other

## 2013-02-28 LAB — PROTIME-INR: Prothrombin Time: 16.3 seconds — ABNORMAL HIGH (ref 11.6–15.2)

## 2013-02-28 MED ORDER — LANTHANUM CARBONATE 500 MG PO CHEW
500.0000 mg | CHEWABLE_TABLET | Freq: Three times a day (TID) | ORAL | Status: DC | PRN
  Filled 2013-02-28: qty 1

## 2013-02-28 NOTE — Progress Notes (Signed)
[facility] ROUNDING NOTE   Subjective:   Interval History:none  Objective:  Vital signs in last 24 hours:  Temp:  [98.8 F (37.1 C)-99.8 F (37.7 C)] 98.9 F (37.2 C) (03/22 0700) Pulse Rate:  [105-112] 112 (03/22 0900) Resp:  [13-28] 13 (03/22 0900) BP: (118-124)/(72-83) 121/79 mmHg (03/22 0900) SpO2:  [97 %-99 %] 99 % (03/22 0900)  Weight change: -2.6 kg (-5 lb 11.7 oz) Filed Weights   02/25/13 1621 02/27/13 0659 02/27/13 1030  Weight: 93.8 kg (206 lb 12.7 oz) 95.4 kg (210 lb 5.1 oz) 92.8 kg (204 lb 9.4 oz)    Intake/Output: I/O last 3 completed shifts: In: 11 [P.O.:720; IV Piggyback:50] Out: 2830 [Other:2500; Chest Tube:330]   Intake/Output this shift:  Total I/O In: 360 [P.O.:360] Out: -   General appearance: alert and no distress  Heart: regular rate and rhythm  Lungs: wheezes RLL  large tidal variation with cough- no definite air leak  AVF thrill and bruit  No lower extremity edema    Basic Metabolic Panel:  Recent Labs Lab 02/23/13 0450 02/25/13 0320 02/27/13 0709  NA 135 136 137  K 4.3 4.7 4.6  CL 95* 96 96  CO2 28 31 26   GLUCOSE 101* 99 96  BUN 55* 44* 46*  CREATININE 11.83* 10.16* 10.45*  CALCIUM 9.5 10.2 10.0  PHOS  --  5.3* 5.1*    Liver Function Tests:  Recent Labs Lab 02/25/13 0320 02/27/13 0709  ALBUMIN 2.1* 2.0*   No results found for this basename: LIPASE, AMYLASE,  in the last 168 hours No results found for this basename: AMMONIA,  in the last 168 hours  CBC:  Recent Labs Lab 02/23/13 0450 02/25/13 0320 02/27/13 0709  WBC 6.9 7.3 6.6  HGB 6.7* 8.0* 8.1*  HCT 20.7* 25.4* 25.0*  MCV 90.0 93.4 90.3  PLT 202 193 197    Cardiac Enzymes: No results found for this basename: CKTOTAL, CKMB, CKMBINDEX, TROPONINI,  in the last 168 hours  BNP: No components found with this basename: POCBNP,   CBG: No results found for this basename: GLUCAP,  in the last 168 hours  Microbiology: Results for orders  placed during the hospital encounter of 02/19/13  TISSUE CULTURE     Status: None   Collection Time    02/19/13 12:19 PM      Result Value Range Status   Specimen Description TISSUE PLEURAL RIGHT   Final   Special Requests RIGHT PLEURAL PEEL PATIENT ON FOLLOWING ZINACEF   Final   Gram Stain     Final   Value: FEW WBC PRESENT,BOTH PMN AND MONONUCLEAR     NO ORGANISMS SEEN   Culture PSEUDOMONAS STUTZERI   Final   Report Status 02/23/2013 FINAL   Final   Organism ID, Bacteria PSEUDOMONAS STUTZERI   Final    Coagulation Studies:  Recent Labs  02/26/13 0340 02/27/13 0315 02/28/13 0426  LABPROT 16.2* 16.0* 16.3*  INR 1.33 1.31 1.34    Urinalysis: No results found for this basename: COLORURINE, APPERANCEUR, LABSPEC, PHURINE, GLUCOSEU, HGBUR, BILIRUBINUR, KETONESUR, PROTEINUR, UROBILINOGEN, NITRITE, LEUKOCYTESUR,  in the last 72 hours    Imaging: Dg Chest Port 1 View  02/28/2013  *RADIOLOGY REPORT*  Clinical Data: Chest tube  PORTABLE CHEST - 1 VIEW  Comparison:   the previous day's study  Findings: Right chest tube remains in place with complex right probable hydropneumothorax, significant right lung consolidation / atelectasis about the hilum.  Left lung is clear.  Heart size  within normal limits for technique.  IMPRESSION:  1.  Stable right chest tube with a complex right hydropneumothorax.   Original Report Authenticated By: D. Wallace Going, MD    Dg Chest Port 1 View  02/27/2013  *RADIOLOGY REPORT*  Clinical Data: Evaluate chest tube placement.  PORTABLE CHEST - 1 VIEW  Comparison: Chest x-ray 02/26/2013.  Findings: A single right-sided chest tube remains in position with tip near the right apex.  There continues to be a moderate right sided hydropneumothorax which is similar to slightly decreased in size compared to yesterday's examination.  Extensive collapse and architectural distortion is noted throughout the right lung, with what are likely multiple pleural adhesions.  Left lung  is flow- volume, but otherwise appears well aerated.  No definite left pleural effusion.  No evidence of pulmonary edema.  Heart size is within normal limits.  Atherosclerosis in the thoracic aorta.  Skin staples are seen projecting over the lateral right hemithorax there is a small amount of subcutaneous emphysema in the right chest wall.  IMPRESSION: 1.  Allowing for slight differences in patient positioning, the radiographic appearance of chest is very similar to yesterday's examination, as detailed above, with slight decreased size of the moderate right hydropneumothorax.   Original Report Authenticated By: Vinnie Langton, M.D.      Medications:     . ceFEPime (MAXIPIME) IV  2 g Intravenous Q M,W,F-2000  . darbepoetin  100 mcg Intravenous Q Fri-HD  . guaiFENesin  1,200 mg Oral BID  . lanthanum  1,000 mg Oral TID WC  . multivitamin  1 tablet Oral QHS  . polyethylene glycol  17 g Oral Daily  . warfarin  2.5 mg Oral Q M,W,F-1800  . warfarin  5 mg Oral Q T,Th,S,Su-1800  . Warfarin - Physician Dosing Inpatient   Does not apply q1800   sodium chloride, sodium chloride, calcium carbonate, feeding supplement (NEPRO CARB STEADY), heparin, lanthanum, levalbuterol, lidocaine-prilocaine, ondansetron (ZOFRAN) IV, oxyCODONE-acetaminophen, pentafluoroprop-tetrafluoroeth, potassium chloride, senna-docusate, white petrolatum  Assessment/ Plan:  1. S/P VATS w repeat decortication and surgical repair of air leak 3/14 2. ESRD, cont mwf HD 3. HTN/volume-stable.  4. Anemia of CKD- darbe 100ug/Fri, transfused 5. Secondary HPTH- phosphorus control improved  May take his phoslo with snacks Plan dialysis Monday      LOS: 9 Stacey Maura W @TODAY @10 :51 AM

## 2013-02-28 NOTE — Progress Notes (Addendum)
                   [address]            [place] [address]          [phone]    9 Days Post-Op Procedure(s) (LRB): VIDEO BRONCHOSCOPY WITH INSERTION OF INTERBRONCHIAL VALVE (IBV) (Right) VIDEO ASSISTED THORACOSCOPY (VATS)/THOROCOTOMY (Right)  Subjective: Patient feeling ok  Objective: Vital signs in last 24 hours: Temp:  [98.8 F (37.1 C)-99.8 F (37.7 C)] 98.9 F (37.2 C) (03/22 0700) Pulse Rate:  [98-111] 108 (03/22 0400) Cardiac Rhythm:  [-] Sinus tachycardia (03/21 2342) Resp:  [15-28] 20 (03/22 0400) BP: (118-145)/(70-83) 124/81 mmHg (03/22 0400) SpO2:  [96 %-99 %] 98 % (03/22 0400) Weight:  [92.8 kg (204 lb 9.4 oz)] 92.8 kg (204 lb 9.4 oz) (03/21 1030)     Intake/Output from previous day: 03/21 0701 - 03/22 0700 In: 410 [P.O.:360; IV Piggyback:50] Out: 2750 [Chest Tube:250]   Physical Exam:  Cardiovascular: RRR Pulmonary: Clear to auscultation on left; diminished breath sounds on right; no rales, wheezes, or rhonchi. Abdomen: Soft, non tender, bowel sounds present. Extremities: Mild bilateral lower extremity edema. Wounds: Clean and dry.  No erythema or signs of infection. Chest Tube: on water seal, +3 air leak with cough  Lab Results: CBC:  Recent Labs  02/27/13 0709  WBC 6.6  HGB 8.1*  HCT 25.0*  PLT 197   BMET:   Recent Labs  02/27/13 0709  NA 137  K 4.6  CL 96  CO2 26  GLUCOSE 96  BUN 46*  CREATININE 10.45*  CALCIUM 10.0    PT/INR:   Recent Labs  02/28/13 0426  LABPROT 16.3*  INR 1.34   ABG:  INR: Will add last result for INR, ABG once components are confirmed Will add last 4 CBG results once components are confirmed  Assessment/Plan:  1. CV - ST/SR. 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  Pulmonary - Had 250 cc of output from the chest tubes.There is still an air leak.Chest tube to remain for now. CXR this am appears stable (moderate right hydropneumothorax, left lung well  aerated). Pseudomonas from cultures of right pleural tissue-continue Maxipime. 3.Anemia-H and H 8.1 and 25. On Aranesp 4.CKD-Had HD yesterday 5.Continue present management  ZIMMERMAN,DONIELLE MPA-C 02/28/2013,8:52 AM  I have seen and examined the patient and agree with the assessment and plan as outlined.  Will wean suction on chest tube.  Metzli Pollick H 02/28/2013 1:19 PM

## 2013-02-28 NOTE — Progress Notes (Signed)
Pt IV OOD.Marland Kitchenrequesting it to be removed. Refusing for new IV to be placed at this time. [name] [name]

## 2013-02-28 NOTE — Progress Notes (Signed)
Pt disconnected from telemetry and suction per MD order to go outside with wife. [name] [name]

## 2013-02-28 NOTE — Progress Notes (Signed)
ANTIBIOTIC CONSULT NOTE - FOLLOW UP  Pharmacy Consult for cefepime Indication: Pseudomonas infection of R pleural tissue   Allergies  Allergen Reactions  . Tape Other (See Comments)    blister    Patient Measurements: Height: 6\' 1"  (185.4 cm) Weight: 204 lb 9.4 oz (92.8 kg) IBW/kg (Calculated) : 79.9   Vital Signs: Temp: 98.9 F (37.2 C) (03/22 0700) Temp src: Oral (03/22 0700) BP: 121/79 mmHg (03/22 0900) Pulse Rate: 112 (03/22 0900) Intake/Output from previous day: 03/21 0701 - 03/22 0700 In: 410 [P.O.:360; IV Piggyback:50] Out: 2750 [Chest Tube:250] Intake/Output from this shift: Total I/O In: 360 [P.O.:360] Out: -   Labs:  Recent Labs  02/27/13 0709  WBC 6.6  HGB 8.1*  PLT 197  CREATININE 10.45*   Estimated Creatinine Clearance: 9.7 ml/min (by C-G formula based on Cr of 10.45). No results found for this basename: VANCOTROUGH, Corlis Leak, VANCORANDOM, GENTTROUGH, GENTPEAK, GENTRANDOM, TOBRATROUGH, TOBRAPEAK, TOBRARND, AMIKACINPEAK, AMIKACINTROU, AMIKACIN,  in the last 72 hours   Microbiology: Recent Results (from the past 720 hour(s))  SURGICAL PCR SCREEN     Status: None   Collection Time    02/17/13  1:44 PM      Result Value Range Status   MRSA, PCR NEGATIVE  NEGATIVE Final   Staphylococcus aureus NEGATIVE  NEGATIVE Final   Comment:            The Xpert SA Assay (FDA     approved for NASAL specimens     in patients over 29 years of age),     is one component of     a comprehensive surveillance     program.  Test performance has     been validated by [employer] for patients greater     than or equal to 50 year old.     It is not intended     to diagnose infection nor to     guide or monitor treatment.  TISSUE CULTURE     Status: None   Collection Time    02/19/13 12:19 PM      Result Value Range Status   Specimen Description TISSUE PLEURAL RIGHT   Final   Special Requests RIGHT PLEURAL PEEL PATIENT ON FOLLOWING ZINACEF   Final   Gram  Stain     Final   Value: FEW WBC PRESENT,BOTH PMN AND MONONUCLEAR     NO ORGANISMS SEEN   Culture PSEUDOMONAS STUTZERI   Final   Report Status 02/23/2013 FINAL   Final   Organism ID, Bacteria PSEUDOMONAS STUTZERI   Final    Anti-infectives   Start     Dose/Rate Route Frequency Ordered Stop   02/27/13 2000  ceFEPIme (MAXIPIME) 2 g in dextrose 5 % 50 mL IVPB    Comments:  Give after hemodialysis on HD days.   2 g 100 mL/hr over 30 Minutes Intravenous Every M-W-F (2000) 02/25/13 0926     02/26/13 1000  ceFEPIme (MAXIPIME) 500 mg in dextrose 5 % 50 mL IVPB  Status:  Discontinued    Comments:  Start today at end of dialysis   500 mg 100 mL/hr over 30 Minutes Intravenous Every 24 hours 02/25/13 0854 02/25/13 0924   02/25/13 2200  ceFEPIme (MAXIPIME) 1 g in dextrose 5 % 50 mL IVPB     1 g 100 mL/hr over 30 Minutes Intravenous  Once 02/25/13 0925 02/25/13 2324   02/25/13 1000  ceFEPIme (MAXIPIME) 1 g in dextrose 5 %  50 mL IVPB  Status:  Discontinued     1 g 100 mL/hr over 30 Minutes Intravenous Every 12 hours 02/25/13 0854 02/25/13 0924   02/25/13 0930  ceFEPIme (MAXIPIME) 1 g in dextrose 5 % 50 mL IVPB     1 g 100 mL/hr over 30 Minutes Intravenous STAT 02/25/13 0925 02/25/13 1630   02/19/13 2300  cefUROXime (ZINACEF) 1.5 g in dextrose 5 % 50 mL IVPB     1.5 g 100 mL/hr over 30 Minutes Intravenous Every 12 hours 02/19/13 2020 02/20/13 1229   02/19/13 2200  vancomycin (VANCOCIN) IVPB 1000 mg/200 mL premix     1,000 mg 200 mL/hr over 60 Minutes Intravenous Every 12 hours 02/19/13 2020 02/19/13 2257   02/19/13 1530  cefUROXime (ZINACEF) injection 750 mg  Status:  Discontinued     750 mg Intramuscular  Once 02/19/13 1523 02/19/13 1527   02/19/13 1530  cefUROXime (ZINACEF) 750 mg in dextrose 5 % 50 mL IVPB     750 mg 100 mL/hr over 30 Minutes Intravenous  Once 02/19/13 1527 02/19/13 1530   02/18/13 1418  cefUROXime (ZINACEF) 1.5 g in dextrose 5 % 50 mL IVPB     1.5 g 100 mL/hr over 30  Minutes Intravenous 60 min pre-op 02/18/13 1418 02/19/13 1057      Assessment: Patient is a 52 y.o ESRD  M s/p VATS currently on cefepime day #4 for pseudomonas infection in pleural tissue.  Patient received about 4 hours of HD yesterday with BFR ~400.  Plan:  1) continue current cefepime regimen of 2gm after each HD session.  Keeli Roberg P 02/28/2013,10:25 AM

## 2013-03-01 ENCOUNTER — Inpatient Hospital Stay (HOSPITAL_COMMUNITY): Payer: Medicare Other

## 2013-03-01 LAB — PROTIME-INR: Prothrombin Time: 17.3 seconds — ABNORMAL HIGH (ref 11.6–15.2)

## 2013-03-01 NOTE — Progress Notes (Addendum)
                   [address]            [place] [address]          [phone]    10 Days Post-Op Procedure(s) (LRB): VIDEO BRONCHOSCOPY WITH INSERTION OF INTERBRONCHIAL VALVE (IBV) (Right) VIDEO ASSISTED THORACOSCOPY (VATS)/THOROCOTOMY (Right)  Subjective: Patient feeling ok  Objective: Vital signs in last 24 hours: Temp:  [98.1 F (36.7 C)-98.5 F (36.9 C)] 98.5 F (36.9 C) (03/23 0715) Pulse Rate:  [90-104] 99 (03/23 0715) Cardiac Rhythm:  [-] Normal sinus rhythm (03/23 0715) Resp:  [0-24] 24 (03/23 0715) BP: (127-138)/(59-89) 138/88 mmHg (03/23 0715) SpO2:  [98 %-100 %] 100 % (03/23 0715)     Intake/Output from previous day: 03/22 0701 - 03/23 0700 In: 720 [P.O.:720] Out: 290 [Chest Tube:290]   Physical Exam:  Cardiovascular: RRR Pulmonary: Clear to auscultation on left; diminished breath sounds on right; no rales, wheezes, or rhonchi. Abdomen: Soft, non tender, bowel sounds present. Extremities: Mild bilateral lower extremity edema. Wounds: Clean and dry.  No erythema or signs of infection. Chest Tube: on suction (10 cm), + air leak   Lab Results: CBC:  Recent Labs  02/27/13 0709  WBC 6.6  HGB 8.1*  HCT 25.0*  PLT 197   BMET:   Recent Labs  02/27/13 0709  NA 137  K 4.6  CL 96  CO2 26  GLUCOSE 96  BUN 46*  CREATININE 10.45*  CALCIUM 10.0    PT/INR:   Recent Labs  03/01/13 0529  LABPROT 17.3*  INR 1.46   ABG:  INR: Will add last result for INR, ABG once components are confirmed Will add last 4 CBG results once components are confirmed  Assessment/Plan:  1. CV - ST/SR. 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  Pulmonary - Had 100 cc of output from the chest tubes.There is still an air leak.Chest tube to remain for now. CXR this am appears stable (moderate right hydropneumothorax, left lung well aerated). Pseudomonas from cultures of right pleural tissue-continue Maxipime. 3.Anemia of CKD-H  and H 8.1 and 25. On Aranesp 4.CKD-Scheduled for HD in am 5.Continue present management  ZIMMERMAN,DONIELLE MPA-C 03/01/2013,9:07 AM    I have seen and examined the patient and agree with the assessment and plan as outlined.  I see no reason to leave the tube on suction.  Yelitza Reach H 03/01/2013 1:02 PM

## 2013-03-01 NOTE — Progress Notes (Signed)
[facility] ROUNDING NOTE   Subjective:   Interval History: none   Objective:  Vital signs in last 24 hours:  Temp:  [98.1 F (36.7 C)-98.5 F (36.9 C)] 98.5 F (36.9 C) (03/23 0715) Pulse Rate:  [90-104] 99 (03/23 0715) Resp:  [0-24] 24 (03/23 0715) BP: (127-138)/(59-89) 138/88 mmHg (03/23 0715) SpO2:  [98 %-100 %] 100 % (03/23 0715)  Weight change:  Filed Weights   02/25/13 1621 02/27/13 0659 02/27/13 1030  Weight: 93.8 kg (206 lb 12.7 oz) 95.4 kg (210 lb 5.1 oz) 92.8 kg (204 lb 9.4 oz)    Intake/Output: I/O last 3 completed shifts: In: 890 [P.O.:840; IV Piggyback:50] Out: 490 [Chest Tube:490]   Intake/Output this shift:  Total I/O In: -  Out: 100 [Chest Tube:100]  General appearance: alert and no distress  Heart: regular rate and rhythm  Lungs: wheezes RLL  large tidal variation with cough- no definite air leak  AVF thrill and bruit  No lower extremity edema    Basic Metabolic Panel:  Recent Labs Lab 02/23/13 0450 02/25/13 0320 02/27/13 0709  NA 135 136 137  K 4.3 4.7 4.6  CL 95* 96 96  CO2 28 31 26   GLUCOSE 101* 99 96  BUN 55* 44* 46*  CREATININE 11.83* 10.16* 10.45*  CALCIUM 9.5 10.2 10.0  PHOS  --  5.3* 5.1*    Liver Function Tests:  Recent Labs Lab 02/25/13 0320 02/27/13 0709  ALBUMIN 2.1* 2.0*   No results found for this basename: LIPASE, AMYLASE,  in the last 168 hours No results found for this basename: AMMONIA,  in the last 168 hours  CBC:  Recent Labs Lab 02/23/13 0450 02/25/13 0320 02/27/13 0709  WBC 6.9 7.3 6.6  HGB 6.7* 8.0* 8.1*  HCT 20.7* 25.4* 25.0*  MCV 90.0 93.4 90.3  PLT 202 193 197    Cardiac Enzymes: No results found for this basename: CKTOTAL, CKMB, CKMBINDEX, TROPONINI,  in the last 168 hours  BNP: No components found with this basename: POCBNP,   CBG: No results found for this basename: GLUCAP,  in the last 168 hours  Microbiology: Results for orders placed during the hospital  encounter of 02/19/13  TISSUE CULTURE     Status: None   Collection Time    02/19/13 12:19 PM      Result Value Range Status   Specimen Description TISSUE PLEURAL RIGHT   Final   Special Requests RIGHT PLEURAL PEEL PATIENT ON FOLLOWING ZINACEF   Final   Gram Stain     Final   Value: FEW WBC PRESENT,BOTH PMN AND MONONUCLEAR     NO ORGANISMS SEEN   Culture PSEUDOMONAS STUTZERI   Final   Report Status 02/23/2013 FINAL   Final   Organism ID, Bacteria PSEUDOMONAS STUTZERI   Final    Coagulation Studies:  Recent Labs  02/27/13 0315 02/28/13 0426 03/01/13 0529  LABPROT 16.0* 16.3* 17.3*  INR 1.31 1.34 1.46    Urinalysis: No results found for this basename: COLORURINE, APPERANCEUR, LABSPEC, PHURINE, GLUCOSEU, HGBUR, BILIRUBINUR, KETONESUR, PROTEINUR, UROBILINOGEN, NITRITE, LEUKOCYTESUR,  in the last 72 hours    Imaging: Dg Chest Port 1 View  03/01/2013  *RADIOLOGY REPORT*  Clinical Data: Chest tube  PORTABLE CHEST - 1 VIEW  Comparison:   the previous day's study  Findings: Right chest tube stable position.  Probable complex hydropneumothorax without convincing interval change.  The right lung is medial and atelectatic.  Left lung clear.  Heart size within normal limits  for technique.  IMPRESSION:  1.  Little change from previous day's portable exam.   Original Report Authenticated By: D. Wallace Going, MD    Dg Chest Port 1 View  02/28/2013  *RADIOLOGY REPORT*  Clinical Data: Chest tube  PORTABLE CHEST - 1 VIEW  Comparison:   the previous day's study  Findings: Right chest tube remains in place with complex right probable hydropneumothorax, significant right lung consolidation / atelectasis about the hilum.  Left lung is clear.  Heart size within normal limits for technique.  IMPRESSION:  1.  Stable right chest tube with a complex right hydropneumothorax.   Original Report Authenticated By: D. Wallace Going, MD      Medications:     . ceFEPime (MAXIPIME) IV  2 g Intravenous Q M,W,F-2000   . darbepoetin  100 mcg Intravenous Q Fri-HD  . lanthanum  1,000 mg Oral TID WC  . multivitamin  1 tablet Oral QHS  . polyethylene glycol  17 g Oral Daily  . warfarin  2.5 mg Oral Q M,W,F-1800  . warfarin  5 mg Oral Q T,Th,S,Su-1800  . Warfarin - Physician Dosing Inpatient   Does not apply q1800   sodium chloride, sodium chloride, calcium carbonate, feeding supplement (NEPRO CARB STEADY), heparin, lanthanum, levalbuterol, lidocaine-prilocaine, ondansetron (ZOFRAN) IV, oxyCODONE-acetaminophen, pentafluoroprop-tetrafluoroeth, potassium chloride, senna-docusate, white petrolatum  Assessment/ Plan:  1. S/P VATS w repeat decortication and surgical repair of air leak 3/14 2. ESRD, cont mwf HD 3. HTN/volume-stable.  4. Anemia of CKD- darbe 100ug/Fri, transfused 5. Secondary HPTH- phosphorus control improved May take his phoslo with snacks  Plan dialysis Monday. No discharge planning at present due to persistent leak      LOS: 10 [name] [name] W @TODAY @10 :53 AM

## 2013-03-02 ENCOUNTER — Inpatient Hospital Stay (HOSPITAL_COMMUNITY): Payer: Medicare Other

## 2013-03-02 LAB — RENAL FUNCTION PANEL
Albumin: 2 g/dL — ABNORMAL LOW (ref 3.5–5.2)
Calcium: 10.2 mg/dL (ref 8.4–10.5)
Creatinine, Ser: 12.21 mg/dL — ABNORMAL HIGH (ref 0.50–1.35)
GFR calc non Af Amer: 4 mL/min — ABNORMAL LOW (ref >90)
Phosphorus: 4.8 mg/dL — ABNORMAL HIGH (ref 2.3–4.6)

## 2013-03-02 LAB — CBC
MCH: 29.6 pg (ref 26.0–34.0)
MCV: 90.4 fL (ref 78.0–100.0)
Platelets: 204 10*3/uL (ref 150–400)
RDW: 16.5 % — ABNORMAL HIGH (ref 11.5–15.5)

## 2013-03-02 MED ORDER — OXYCODONE-ACETAMINOPHEN 5-325 MG PO TABS
ORAL_TABLET | ORAL | Status: AC
  Filled 2013-03-02: qty 2

## 2013-03-02 NOTE — Progress Notes (Signed)
Utilization review completed.  

## 2013-03-02 NOTE — Procedures (Signed)
Patient was seen on dialysis and the procedure was supervised.  BFR 400  Via AVF BP is  96/62.   Patient appears to be tolerating treatment well  Rozlynn Lippold A 03/02/2013

## 2013-03-02 NOTE — Progress Notes (Signed)
[facility] ROUNDING NOTE   Subjective:   Interval History: Seen on HD, no new complaints.  Does not know plan from CTS   Objective:  Vital signs in last 24 hours:  Temp:  [97.8 F (36.6 C)-98.9 F (37.2 C)] 97.8 F (36.6 C) (03/24 0650) Pulse Rate:  [85-104] 95 (03/24 1000) Resp:  [14-23] 14 (03/24 1000) BP: (92-149)/(60-98) 101/60 mmHg (03/24 1000) SpO2:  [98 %-100 %] 100 % (03/24 0654) Weight:  [96 kg (211 lb 10.3 oz)] 96 kg (211 lb 10.3 oz) (03/24 0650)  Weight change:  Filed Weights   02/27/13 1030 03/02/13 0336 03/02/13 0650  Weight: 92.8 kg (204 lb 9.4 oz) 96 kg (211 lb 10.3 oz) 96 kg (211 lb 10.3 oz)    Intake/Output: I/O last 3 completed shifts: In: 960 [P.O.:960] Out: 180 [Chest Tube:180]   Intake/Output this shift:     General appearance: alert and no distress  Heart: regular rate and rhythm  Lungs: wheezes RLL  large tidal variation with cough- no definite air leak  AVF thrill and bruit  No lower extremity edema    Basic Metabolic Panel:  Recent Labs Lab 02/25/13 0320 02/27/13 0709 03/02/13 0710  NA 136 137 138  K 4.7 4.6 5.1  CL 96 96 97  CO2 31 26 27   GLUCOSE 99 96 102*  BUN 44* 46* 51*  CREATININE 10.16* 10.45* 12.21*  CALCIUM 10.2 10.0 10.2  PHOS 5.3* 5.1* 4.8*    Liver Function Tests:  Recent Labs Lab 02/25/13 0320 02/27/13 0709 03/02/13 0710  ALBUMIN 2.1* 2.0* 2.0*   No results found for this basename: LIPASE, AMYLASE,  in the last 168 hours No results found for this basename: AMMONIA,  in the last 168 hours  CBC:  Recent Labs Lab 02/25/13 0320 02/27/13 0709 03/02/13 0710  WBC 7.3 6.6 7.3  HGB 8.0* 8.1* 7.7*  HCT 25.4* 25.0* 23.5*  MCV 93.4 90.3 90.4  PLT 193 197 204    Cardiac Enzymes: No results found for this basename: CKTOTAL, CKMB, CKMBINDEX, TROPONINI,  in the last 168 hours  BNP: No components found with this basename: POCBNP,   CBG: No results found for this basename: GLUCAP,  in the  last 168 hours  Microbiology: Results for orders placed during the hospital encounter of 02/19/13  TISSUE CULTURE     Status: None   Collection Time    02/19/13 12:19 PM      Result Value Range Status   Specimen Description TISSUE PLEURAL RIGHT   Final   Special Requests RIGHT PLEURAL PEEL PATIENT ON FOLLOWING ZINACEF   Final   Gram Stain     Final   Value: FEW WBC PRESENT,BOTH PMN AND MONONUCLEAR     NO ORGANISMS SEEN   Culture PSEUDOMONAS STUTZERI   Final   Report Status 02/23/2013 FINAL   Final   Organism ID, Bacteria PSEUDOMONAS STUTZERI   Final    Coagulation Studies:  Recent Labs  02/28/13 0426 03/01/13 0529 03/02/13 0545  LABPROT 16.3* 17.3* 17.6*  INR 1.34 1.46 1.49    Urinalysis: No results found for this basename: COLORURINE, APPERANCEUR, LABSPEC, PHURINE, GLUCOSEU, HGBUR, BILIRUBINUR, KETONESUR, PROTEINUR, UROBILINOGEN, NITRITE, LEUKOCYTESUR,  in the last 72 hours    Imaging: Dg Chest Port 1 View  03/02/2013  *RADIOLOGY REPORT*  Clinical Data: Right pneumothorax  PORTABLE CHEST - 1 VIEW  Comparison: 03/01/2013  Findings: The stable appearing right-sided pneumothorax is seen. Chest tube is noted in place.  The left  lung remains clear.  The cardiac shadow is unremarkable.  IMPRESSION: No change from prior exam.   Original Report Authenticated By: Inez Catalina, M.D.    Dg Chest Port 1 View  03/01/2013  *RADIOLOGY REPORT*  Clinical Data: Chest tube  PORTABLE CHEST - 1 VIEW  Comparison:   the previous day's study  Findings: Right chest tube stable position.  Probable complex hydropneumothorax without convincing interval change.  The right lung is medial and atelectatic.  Left lung clear.  Heart size within normal limits for technique.  IMPRESSION:  1.  Little change from previous day's portable exam.   Original Report Authenticated By: D. Wallace Going, MD      Medications:     . ceFEPime (MAXIPIME) IV  2 g Intravenous Q M,W,F-2000  . darbepoetin  100 mcg Intravenous  Q Fri-HD  . lanthanum  1,000 mg Oral TID WC  . multivitamin  1 tablet Oral QHS  . oxyCODONE-acetaminophen      . polyethylene glycol  17 g Oral Daily  . warfarin  2.5 mg Oral Q M,W,F-1800  . warfarin  5 mg Oral Q T,Th,S,Su-1800  . Warfarin - Physician Dosing Inpatient   Does not apply q1800   sodium chloride, sodium chloride, calcium carbonate, feeding supplement (NEPRO CARB STEADY), heparin, lanthanum, levalbuterol, lidocaine-prilocaine, ondansetron (ZOFRAN) IV, oxyCODONE-acetaminophen, pentafluoroprop-tetrafluoroeth, potassium chloride, senna-docusate, white petrolatum  Assessment/ Plan: 52 year old BM with ESRD- now s/p additional management of CT/PTX  1. S/P VATS w repeat decortication and surgical repair of air leak 3/14 2. ESRD, cont mwf HD- normally [facility] unit on [address] via AVF 3. HTN/volume-stable. No meds 4. Anemia of CKD- darbe 100ug/Fri, transfused 5. Secondary HPTH- phosphorus control improved on Fosrenol/renavite.  No vitamin D and is hypercalcemic.   patient says that PTH was below 150 so was taken off of sensipar, will recheck PTH with HD on Wednesday to see if needs to be restarted     LOS: 11 Jahmad Petrich A @TODAY @10 :25 AM

## 2013-03-02 NOTE — Progress Notes (Addendum)
                    [address]            [place] [address]          [phone]     11 Days Post-Op Procedure(s) (LRB): VIDEO BRONCHOSCOPY WITH INSERTION OF INTERBRONCHIAL VALVE (IBV) (Right) VIDEO ASSISTED THORACOSCOPY (VATS)/THOROCOTOMY (Right)  Subjective: Seen on HD unit.  Stable, no new complaints.  Objective: Vital signs in last 24 hours: Patient Vitals for the past 24 hrs:  BP Temp Temp src Pulse Resp SpO2 Weight  03/02/13 0800 124/79 mmHg - - 93 16 - -  03/02/13 0730 132/68 mmHg - - 87 20 - -  03/02/13 0654 126/88 mmHg - - 88 19 100 % -  03/02/13 0650 132/83 mmHg 97.8 F (36.6 C) Oral 85 20 100 % 211 lb 10.3 oz (96 kg)  03/02/13 0336 121/73 mmHg 98.9 F (37.2 C) Oral 98 23 100 % 211 lb 10.3 oz (96 kg)  03/02/13 0000 149/89 mmHg 98.3 F (36.8 C) Oral 100 19 100 % -  03/01/13 2000 125/82 mmHg 98.9 F (37.2 C) Oral 100 18 100 % -  03/01/13 1540 126/84 mmHg 98.3 F (36.8 C) Oral 101 18 98 % -  03/01/13 1130 123/73 mmHg 98.4 F (36.9 C) Oral 97 21 99 % -   Current Weight  03/02/13 211 lb 10.3 oz (96 kg)     Intake/Output from previous day: 03/23 0701 - 03/24 0700 In: 600 [P.O.:600] Out: 170 [Chest Tube:170]    PHYSICAL EXAM:  Heart: RRR Lungs: Slightly decreased BS in R base Chest tube: + air leak   Lab Results: CBC: Recent Labs  03/02/13 0710  WBC 7.3  HGB 7.7*  HCT 23.5*  PLT 204   BMET:  Recent Labs  03/02/13 0710  NA 138  K 5.1  CL 97  CO2 27  GLUCOSE 102*  BUN 51*  CREATININE 12.21*  CALCIUM 10.2    PT/INR:  Recent Labs  03/02/13 0545  LABPROT 17.6*  INR 1.49   CXR: Findings: The stable appearing right-sided pneumothorax is seen. Chest tube is noted in place. The left lung remains clear. The cardiac shadow is unremarkable.  IMPRESSION:  No change from prior exam    Assessment/Plan: S/P Procedure(s) (LRB): VIDEO BRONCHOSCOPY WITH INSERTION OF INTERBRONCHIAL VALVE (IBV) (Right) VIDEO ASSISTED  THORACOSCOPY (VATS)/THOROCOTOMY (Right) Persistent air leak- Continue CT to water seal. Medical issues per renal.   LOS: 11 days    COLLINS,GINA H 03/02/2013  Patient seen and examined. Agree with above. He still has an air leak. He felt worse with CT on suction so placed back to water seal over the weekend. He has more atelectasis on his CXR today. I reviewed the films with Dr. Harlow Mares re: possible muscle flaps to fill pleural space.   Discussed options with patient - repeat valves are an option, but he will still have a space and likely require flaps anyway. - A Clagett or Eloesser type procedure is an option but would require a long time to heal and has significant morbidity. - best option in my opinion is a flap procedure- likely a combination of latissimus or pectoralis and omentum.  I offered him the option of transfer to a tertiary center given our limited experience with this specific procedure here.  He seems a little overwhelmed and will need some time to think about the options.

## 2013-03-03 ENCOUNTER — Inpatient Hospital Stay (HOSPITAL_COMMUNITY): Payer: Medicare Other

## 2013-03-03 NOTE — Progress Notes (Signed)
[facility] ROUNDING NOTE   Subjective:   No new issues.  Tolerated 2500 UF with HD yesterday.  Considering transfer to tertiary care center [facility])   Objective:  Vital signs in last 24 hours:  Temp:  [96.8 F (36 C)-99.7 F (37.6 C)] 98.4 F (36.9 C) (03/25 0800) Pulse Rate:  [95-101] 98 (03/25 0800) Resp:  [14-24] 16 (03/25 0800) BP: (101-154)/(60-94) 129/76 mmHg (03/25 0800) SpO2:  [97 %-100 %] 98 % (03/25 0800) Weight:  [93.5 kg (206 lb 2.1 oz)] 93.5 kg (206 lb 2.1 oz) (03/24 1047)  Weight change: -2.5 kg (-5 lb 8.2 oz) Filed Weights   03/02/13 0336 03/02/13 0650 03/02/13 1047  Weight: 96 kg (211 lb 10.3 oz) 96 kg (211 lb 10.3 oz) 93.5 kg (206 lb 2.1 oz)    Intake/Output: I/O last 3 completed shifts: In: 1010 [P.O.:960; IV Piggyback:50] Out: 2620 [Other:2500; Chest Tube:120]   Intake/Output this shift:  Total I/O In: 240 [P.O.:240] Out: 50 [Chest Tube:50]  General appearance: alert and no distress  Heart: regular rate and rhythm  Lungs: wheezes RLL  large tidal variation with cough- no definite air leak  AVF thrill and bruit  No lower extremity edema    Basic Metabolic Panel:  Recent Labs Lab 02/25/13 0320 02/27/13 0709 03/02/13 0710  NA 136 137 138  K 4.7 4.6 5.1  CL 96 96 97  CO2 31 26 27   GLUCOSE 99 96 102*  BUN 44* 46* 51*  CREATININE 10.16* 10.45* 12.21*  CALCIUM 10.2 10.0 10.2  PHOS 5.3* 5.1* 4.8*    Liver Function Tests:  Recent Labs Lab 02/25/13 0320 02/27/13 0709 03/02/13 0710  ALBUMIN 2.1* 2.0* 2.0*   No results found for this basename: LIPASE, AMYLASE,  in the last 168 hours No results found for this basename: AMMONIA,  in the last 168 hours  CBC:  Recent Labs Lab 02/25/13 0320 02/27/13 0709 03/02/13 0710  WBC 7.3 6.6 7.3  HGB 8.0* 8.1* 7.7*  HCT 25.4* 25.0* 23.5*  MCV 93.4 90.3 90.4  PLT 193 197 204    Cardiac Enzymes: No results found for this basename: CKTOTAL, CKMB, CKMBINDEX, TROPONINI,  in the  last 168 hours  BNP: No components found with this basename: POCBNP,   CBG: No results found for this basename: GLUCAP,  in the last 168 hours  Microbiology: Results for orders placed during the hospital encounter of 02/19/13  TISSUE CULTURE     Status: None   Collection Time    02/19/13 12:19 PM      Result Value Range Status   Specimen Description TISSUE PLEURAL RIGHT   Final   Special Requests RIGHT PLEURAL PEEL PATIENT ON FOLLOWING ZINACEF   Final   Gram Stain     Final   Value: FEW WBC PRESENT,BOTH PMN AND MONONUCLEAR     NO ORGANISMS SEEN   Culture PSEUDOMONAS STUTZERI   Final   Report Status 02/23/2013 FINAL   Final   Organism ID, Bacteria PSEUDOMONAS STUTZERI   Final    Coagulation Studies:  Recent Labs  03/01/13 0529 03/02/13 0545 03/03/13 0445  LABPROT 17.3* 17.6* 17.6*  INR 1.46 1.49 1.49    Urinalysis: No results found for this basename: COLORURINE, APPERANCEUR, LABSPEC, PHURINE, GLUCOSEU, HGBUR, BILIRUBINUR, KETONESUR, PROTEINUR, UROBILINOGEN, NITRITE, LEUKOCYTESUR,  in the last 72 hours    Imaging: Dg Chest Port 1 View  03/03/2013  *RADIOLOGY REPORT*  Clinical Data: Post VATS, right-sided pneumothorax  PORTABLE CHEST - 1 VIEW  Comparison: 03/02/2013; 02/27/2013; 02/23/2013  Findings: Grossly unchanged cardiac silhouette and mediastinal contours with obscuration of the right heart border secondary to right mid and lower lung heterogeneous / consolidative opacities. Grossly unchanged appearance of laterally positioned right-sided small to moderate-sized hydropneumothorax.  Stable positioning of right-sided chest tube.  The left hemithorax is unchanged. Unchanged bones.  IMPRESSION: Stable position of support apparatus.  Grossly unchanged small to moderate-sized right-sided hydropneumothorax.   Original Report Authenticated By: Jake Seats, MD    Dg Chest Port 1 View  03/02/2013  *RADIOLOGY REPORT*  Clinical Data: Right pneumothorax  PORTABLE CHEST - 1 VIEW   Comparison: 03/01/2013  Findings: The stable appearing right-sided pneumothorax is seen. Chest tube is noted in place.  The left lung remains clear.  The cardiac shadow is unremarkable.  IMPRESSION: No change from prior exam.   Original Report Authenticated By: Inez Catalina, M.D.      Medications:     . ceFEPime (MAXIPIME) IV  2 g Intravenous Q M,W,F-2000  . darbepoetin  100 mcg Intravenous Q Fri-HD  . lanthanum  1,000 mg Oral TID WC  . multivitamin  1 tablet Oral QHS  . polyethylene glycol  17 g Oral Daily  . warfarin  2.5 mg Oral Q M,W,F-1800  . warfarin  5 mg Oral Q T,Th,S,Su-1800  . Warfarin - Physician Dosing Inpatient   Does not apply q1800   sodium chloride, sodium chloride, calcium carbonate, feeding supplement (NEPRO CARB STEADY), heparin, lanthanum, levalbuterol, lidocaine-prilocaine, ondansetron (ZOFRAN) IV, oxyCODONE-acetaminophen, pentafluoroprop-tetrafluoroeth, potassium chloride, senna-docusate, white petrolatum  Assessment/ Plan: 52 year old BM with ESRD- now s/p additional management of CT/PTX  1. S/P VATS w repeat decortication and surgical repair of air leak 3/14 2. ESRD, cont mwf HD- normally [facility] unit on [address] via AVF 3. HTN/volume-stable. No meds 4. Anemia of CKD- darbe 100ug/Fri, transfused 5. Secondary HPTH- phosphorus control improved on Fosrenol/renavite.  No vitamin D and is hypercalcemic.   patient says that PTH was below 150 so was taken off of sensipar, will recheck PTH with HD on Wednesday to see if needs to be restarted     LOS: 12 Jaedin Trumbo A @TODAY @9 :04 AM

## 2013-03-03 NOTE — Progress Notes (Signed)
ANTIBIOTIC CONSULT NOTE - FOLLOW UP  Pharmacy Consult for cefepime Indication: Pseudomonas infection of R pleural tissue   Allergies  Allergen Reactions  . Tape Other (See Comments)    blister    Patient Measurements: Height: 6\' 1"  (185.4 cm) Weight: 206 lb 2.1 oz (93.5 kg) IBW/kg (Calculated) : 79.9   Vital Signs: Temp: 98.4 F (36.9 C) (03/25 0800) Temp src: Oral (03/25 0800) BP: 129/76 mmHg (03/25 0800) Pulse Rate: 98 (03/25 0800) Intake/Output from previous day: 03/24 0701 - 03/25 0700 In: 890 [P.O.:840; IV Piggyback:50] Out: 2600 [Chest Tube:100] Intake/Output from this shift: Total I/O In: 240 [P.O.:240] Out: 50 [Chest Tube:50]  Labs:  Recent Labs  03/02/13 0710  WBC 7.3  HGB 7.7*  PLT 204  CREATININE 12.21*   Estimated Creatinine Clearance: 8.3 ml/min (by C-G formula based on Cr of 12.21). No results found for this basename: VANCOTROUGH, Corlis Leak, VANCORANDOM, GENTTROUGH, GENTPEAK, GENTRANDOM, TOBRATROUGH, TOBRAPEAK, TOBRARND, AMIKACINPEAK, AMIKACINTROU, AMIKACIN,  in the last 72 hours   Microbiology: Recent Results (from the past 720 hour(s))  SURGICAL PCR SCREEN     Status: None   Collection Time    02/17/13  1:44 PM      Result Value Range Status   MRSA, PCR NEGATIVE  NEGATIVE Final   Staphylococcus aureus NEGATIVE  NEGATIVE Final   Comment:            The Xpert SA Assay (FDA     approved for NASAL specimens     in patients over 26 years of age),     is one component of     a comprehensive surveillance     program.  Test performance has     been validated by [employer] for patients greater     than or equal to 67 year old.     It is not intended     to diagnose infection nor to     guide or monitor treatment.  TISSUE CULTURE     Status: None   Collection Time    02/19/13 12:19 PM      Result Value Range Status   Specimen Description TISSUE PLEURAL RIGHT   Final   Special Requests RIGHT PLEURAL PEEL PATIENT ON FOLLOWING ZINACEF    Final   Gram Stain     Final   Value: FEW WBC PRESENT,BOTH PMN AND MONONUCLEAR     NO ORGANISMS SEEN   Culture PSEUDOMONAS STUTZERI   Final   Report Status 02/23/2013 FINAL   Final   Organism ID, Bacteria PSEUDOMONAS STUTZERI   Final     Assessment: Patient is a 52 y.o ESRD  M s/p VATS currently on cefepime day #7 for pseudomonas stutzeri infection in pleural tissue. WBC down to nl at 7.3, Tmax 99.7. S/p HD w/ 2500 UF yesterday.  Plan:  1) continue current cefepime regimen of 2gm after each HD session. Eudelia Bunch, Pharm.D. BP:[id number] 03/03/2013 10:25 AM

## 2013-03-03 NOTE — Progress Notes (Addendum)
[address]            [place] [address]          [phone]     12 Days Post-Op Procedure(s) (LRB): VIDEO BRONCHOSCOPY WITH INSERTION OF INTERBRONCHIAL VALVE (IBV) (Right) VIDEO ASSISTED THORACOSCOPY (VATS)/THOROCOTOMY (Right)  Subjective: C/o some burning around incision this am. Breathing stable.  No new complaints.   Objective: Vital signs in last 24 hours: Patient Vitals for the past 24 hrs:  BP Temp Temp src Pulse Resp SpO2 Weight  03/03/13 0753 - 98.4 F (36.9 C) Oral - - - -  03/03/13 0400 154/94 mmHg 98.6 F (37 C) Oral - 14 98 % -  03/03/13 0000 133/75 mmHg 98.6 F (37 C) Oral - 18 98 % -  03/02/13 2007 126/78 mmHg 99.7 F (37.6 C) Oral 98 16 97 % -  03/02/13 1600 124/77 mmHg 98.4 F (36.9 C) Oral 101 24 99 % -  03/02/13 1230 111/60 mmHg 98.3 F (36.8 C) Oral - 15 - -  03/02/13 1121 - 98.1 F (36.7 C) Oral - - - -  03/02/13 1047 121/88 mmHg 96.8 F (36 C) Oral 96 14 100 % 206 lb 2.1 oz (93.5 kg)  03/02/13 1042 114/81 mmHg - - 96 17 - -  03/02/13 1030 118/71 mmHg - - 95 16 - -  03/02/13 1000 101/60 mmHg - - 95 14 - -  03/02/13 0930 133/62 mmHg - - 98 18 - -  03/02/13 0900 92/81 mmHg - - 101 20 - -  03/02/13 0830 120/98 mmHg - - 104 17 - -  03/02/13 0800 124/79 mmHg - - 93 16 - -   Current Weight  03/02/13 206 lb 2.1 oz (93.5 kg)     Intake/Output from previous day: 03/24 0701 - 03/25 0700 In: 33 [P.O.:840; IV Piggyback:50] Out: 2600 [Chest Tube:100]    PHYSICAL EXAM:  Heart: RRR Lungs: Decreased BS on R Wound: Clean and dry Chest tube: + air leak    Lab Results: CBC: Recent Labs  03/02/13 0710  WBC 7.3  HGB 7.7*  HCT 23.5*  PLT 204   BMET:  Recent Labs  03/02/13 0710  NA 138  K 5.1  CL 97  CO2 27  GLUCOSE 102*  BUN 51*  CREATININE 12.21*  CALCIUM 10.2    PT/INR:  Recent Labs  03/03/13 0445  LABPROT 17.6*  INR 1.49    CXR: Findings: Grossly unchanged cardiac silhouette and  mediastinal contours with obscuration of the right heart border secondary to right mid and lower lung heterogeneous/ consolidative opacities. Grossly unchanged appearance of laterally positioned right-sided  small to moderate-sized hydropneumothorax. Stable positioning of right-sided chest tube. The left hemithorax is unchanged. Unchanged bones.  IMPRESSION:  Stable position of support apparatus. Grossly unchanged small to moderate-sized right-sided hydropneumothorax.   Assessment/Plan: S/P Procedure(s) (LRB): VIDEO BRONCHOSCOPY WITH INSERTION OF INTERBRONCHIAL VALVE (IBV) (Right) VIDEO ASSISTED THORACOSCOPY (VATS)/THOROCOTOMY (Right) Persistent air leak- Continue CT to water seal for now. Possibly can d/c every other skin staple. Pt has considered the options discussed with him yesterday and is leaning toward transfer to tertiary care center for further management.  Continue current care for now. MD to see.   LOS: 12 days    COLLINS,GINA H 03/03/2013  Patient seen and examined. Agree with above He is favoring going to [facility] for evaluation for flaps  to deal with pleural space He wants to go home and "give it some time to heal"- i emphasized that delaying an intervention will only make it more difficult to deal with down the road. I encouraged him to go ahead and get the process started. I will call thoracic surgeon at [facility] tomorrow Will change to mini express in AM Probably home with tube Thursday

## 2013-03-03 NOTE — Progress Notes (Signed)
I was asked to see this patient by Dr. Merilynn Finland. I have reviewed findings including chest CT scans, examined patient, and discussed those findings along with reccomendations.   Location of right chest incision makes it problematic to use Latissimus flap as a pedicled flap. Doubt the arc of rotation of the superior portion above incision would be enough. Also doubt volume would be enough. Another option is right pectoralis and supplement lower chest cavity with omentum if needed. We discussed incisions, risks, and possible deficits. Risks include, but are not limited to, loss of tissue, failure to heal, donor site morbidity, infection, bleeding, chronic pain, loss of strength and range of motion, and unacceptable scarring. He is leaning toward a second opnion from teaching center but will let me know.

## 2013-03-04 ENCOUNTER — Inpatient Hospital Stay (HOSPITAL_COMMUNITY): Payer: Medicare Other

## 2013-03-04 DIAGNOSIS — Z9889 Other specified postprocedural states: Secondary | ICD-10-CM

## 2013-03-04 LAB — CBC
HCT: 25.5 % — ABNORMAL LOW (ref 39.0–52.0)
MCH: 29.4 pg (ref 26.0–34.0)
MCHC: 32.2 g/dL (ref 30.0–36.0)
RDW: 16.5 % — ABNORMAL HIGH (ref 11.5–15.5)

## 2013-03-04 LAB — RENAL FUNCTION PANEL
Albumin: 2.1 g/dL — ABNORMAL LOW (ref 3.5–5.2)
BUN: 49 mg/dL — ABNORMAL HIGH (ref 6–23)
Calcium: 10.4 mg/dL (ref 8.4–10.5)
Creatinine, Ser: 10.67 mg/dL — ABNORMAL HIGH (ref 0.50–1.35)
Phosphorus: 3.7 mg/dL (ref 2.3–4.6)
Potassium: 4.9 mEq/L (ref 3.5–5.1)

## 2013-03-04 LAB — PROTIME-INR: INR: 1.51 — ABNORMAL HIGH (ref 0.00–1.49)

## 2013-03-04 MED ORDER — PENTAFLUOROPROP-TETRAFLUOROETH EX AERO: 1.0000 "application " | INHALATION_SPRAY | CUTANEOUS | Status: DC | PRN

## 2013-03-04 MED ORDER — SODIUM CHLORIDE 0.9 % IV SOLN: 100.0000 mL | INTRAVENOUS | Status: DC | PRN

## 2013-03-04 MED ORDER — LIDOCAINE-PRILOCAINE 2.5-2.5 % EX CREA
1.0000 "application " | TOPICAL_CREAM | CUTANEOUS | Status: DC | PRN
  Filled 2013-03-04: qty 5

## 2013-03-04 MED ORDER — LIDOCAINE HCL (PF) 1 % IJ SOLN: 5.0000 mL | INTRAMUSCULAR | Status: DC | PRN

## 2013-03-04 MED ORDER — DARBEPOETIN ALFA-POLYSORBATE 100 MCG/0.5ML IJ SOLN
INTRAMUSCULAR | Status: AC
  Filled 2013-03-04: qty 0.5

## 2013-03-04 MED ORDER — HEPARIN SODIUM (PORCINE) 1000 UNIT/ML DIALYSIS: 1000.0000 [IU] | INTRAMUSCULAR | Status: DC | PRN

## 2013-03-04 MED ORDER — NEPRO/CARBSTEADY PO LIQD
237.0000 mL | ORAL | Status: DC | PRN
  Filled 2013-03-04: qty 237

## 2013-03-04 MED ORDER — OXYCODONE-ACETAMINOPHEN 5-325 MG PO TABS: 1.0000 | ORAL_TABLET | ORAL | Status: DC | PRN

## 2013-03-04 MED ORDER — HEPARIN SODIUM (PORCINE) 1000 UNIT/ML DIALYSIS
1000.0000 [IU] | INTRAMUSCULAR | Status: DC | PRN
  Filled 2013-03-04: qty 1

## 2013-03-04 MED ORDER — OXYCODONE-ACETAMINOPHEN 5-325 MG PO TABS
ORAL_TABLET | ORAL | Status: AC
  Administered 2013-03-04: 2 via ORAL
  Filled 2013-03-04: qty 2

## 2013-03-04 MED ORDER — ALTEPLASE 2 MG IJ SOLR
2.0000 mg | Freq: Once | INTRAMUSCULAR | Status: AC | PRN
  Filled 2013-03-04: qty 2

## 2013-03-04 MED ORDER — DEXTROSE 5 % IV SOLN: INTRAVENOUS | Status: DC

## 2013-03-04 MED ORDER — ALTEPLASE 2 MG IJ SOLR
2.0000 mg | Freq: Once | INTRAMUSCULAR | Status: DC | PRN
  Filled 2013-03-04: qty 2

## 2013-03-04 MED ORDER — HEPARIN SODIUM (PORCINE) 1000 UNIT/ML DIALYSIS: 20.0000 [IU]/kg | INTRAMUSCULAR | Status: DC | PRN

## 2013-03-04 NOTE — Progress Notes (Addendum)
                   [address]            [place] [address]          [phone]    13 Days Post-Op Procedure(s) (LRB): VIDEO BRONCHOSCOPY WITH INSERTION OF INTERBRONCHIAL VALVE (IBV) (Right) VIDEO ASSISTED THORACOSCOPY (VATS)/THOROCOTOMY (Right)  Subjective: Patient doing ok  Objective: Vital signs in last 24 hours: Temp:  [97.6 F (36.4 C)-99 F (37.2 C)] 98.5 F (36.9 C) (03/26 0748) Pulse Rate:  [87-96] 96 (03/26 0016) Cardiac Rhythm:  [-] Sinus tachycardia (03/26 0320) Resp:  [13-18] 17 (03/26 0320) BP: (124-157)/(69-96) 157/96 mmHg (03/26 0320) SpO2:  [98 %-100 %] 100 % (03/26 0320)     Intake/Output from previous day: 03/25 0701 - 03/26 0700 In: 1200 [P.O.:1200] Out: 80 [Stool:1; Chest Tube:50]   Physical Exam:  Cardiovascular: RRR Pulmonary: Clear to auscultation on left; diminished breath sounds on right; no rales, wheezes, or rhonchi. Abdomen: Soft, non tender, bowel sounds present. Extremities: Mild bilateral lower extremity edema. Wounds: Clean and dry.  No erythema or signs of infection. Chest Tube: on water seal , + air leak   Lab Results: CBC:  Recent Labs  03/02/13 0710  WBC 7.3  HGB 7.7*  HCT 23.5*  PLT 204   BMET:   Recent Labs  03/02/13 0710  NA 138  K 5.1  CL 97  CO2 27  GLUCOSE 102*  BUN 51*  CREATININE 12.21*  CALCIUM 10.2    PT/INR:   Recent Labs  03/04/13 0450  LABPROT 17.8*  INR 1.51*   ABG:  INR: Will add last result for INR, ABG once components are confirmed Will add last 4 CBG results once components are confirmed  Assessment/Plan:  1. CV - ST/SR. 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  Pulmonary - Had 50 cc of output from the chest tubes.There is still an air leak.Chest tube to be changed to mini express. CXR this am appears stable (moderate right hydropneumothorax, left lung well aerated). Pseudomonas from cultures of right pleural tissue-continue Maxipime. Dr.  Roxan Hockey to discuss with thoracic surgeon at [facility]. 3.Anemia of CKD-H and H 8.1 and 25. On Aranesp 4.CKD-HD per nephrology 5.Will discuss with surgeon when to remove staples 6.Likely discharge in am  Lamiyah Schlotter MPA-C 03/04/2013,8:25 AM

## 2013-03-04 NOTE — Progress Notes (Signed)
[facility] ROUNDING NOTE   Subjective:   No new issues.  Seen on HD.  Considering transfer to tertiary care center [facility])- no concrete plans   Objective:  Vital signs in last 24 hours:  Temp:  [97.6 F (36.4 C)-99 F (37.2 C)] 97.9 F (36.6 C) (03/26 0911) Pulse Rate:  [87-96] 95 (03/26 0922) Resp:  [13-18] 16 (03/26 0922) BP: (124-157)/(69-104) 138/104 mmHg (03/26 0922) SpO2:  [98 %-100 %] 98 % (03/26 0911) Weight:  [95.6 kg (210 lb 12.2 oz)] 95.6 kg (210 lb 12.2 oz) (03/26 0911)  Weight change:  Filed Weights   03/02/13 0650 03/02/13 1047 03/04/13 0911  Weight: 96 kg (211 lb 10.3 oz) 93.5 kg (206 lb 2.1 oz) 95.6 kg (210 lb 12.2 oz)    Intake/Output: I/O last 3 completed shifts: In: 1250 [P.O.:1200; IV Piggyback:50] Out: 80 [Stool:1; Chest Tube:50]   Intake/Output this shift:  Total I/O In: 360 [P.O.:360] Out: 0   General appearance: alert and no distress  Heart: regular rate and rhythm  Lungs: wheezes RLL  large tidal variation with cough- no definite air leak  AVF thrill and bruit  No lower extremity edema    Basic Metabolic Panel:  Recent Labs Lab 02/27/13 0709 03/02/13 0710  NA 137 138  K 4.6 5.1  CL 96 97  CO2 26 27  GLUCOSE 96 102*  BUN 46* 51*  CREATININE 10.45* 12.21*  CALCIUM 10.0 10.2  PHOS 5.1* 4.8*    Liver Function Tests:  Recent Labs Lab 02/27/13 0709 03/02/13 0710  ALBUMIN 2.0* 2.0*   No results found for this basename: LIPASE, AMYLASE,  in the last 168 hours No results found for this basename: AMMONIA,  in the last 168 hours  CBC:  Recent Labs Lab 02/27/13 0709 03/02/13 0710  WBC 6.6 7.3  HGB 8.1* 7.7*  HCT 25.0* 23.5*  MCV 90.3 90.4  PLT 197 204    Cardiac Enzymes: No results found for this basename: CKTOTAL, CKMB, CKMBINDEX, TROPONINI,  in the last 168 hours  BNP: No components found with this basename: POCBNP,   CBG: No results found for this basename: GLUCAP,  in the last 168  hours  Microbiology: Results for orders placed during the hospital encounter of 02/19/13  TISSUE CULTURE     Status: None   Collection Time    02/19/13 12:19 PM      Result Value Range Status   Specimen Description TISSUE PLEURAL RIGHT   Final   Special Requests RIGHT PLEURAL PEEL PATIENT ON FOLLOWING ZINACEF   Final   Gram Stain     Final   Value: FEW WBC PRESENT,BOTH PMN AND MONONUCLEAR     NO ORGANISMS SEEN   Culture PSEUDOMONAS STUTZERI   Final   Report Status 02/23/2013 FINAL   Final   Organism ID, Bacteria PSEUDOMONAS STUTZERI   Final    Coagulation Studies:  Recent Labs  03/02/13 0545 03/03/13 0445 03/04/13 0450  LABPROT 17.6* 17.6* 17.8*  INR 1.49 1.49 1.51*    Urinalysis: No results found for this basename: COLORURINE, APPERANCEUR, LABSPEC, PHURINE, GLUCOSEU, HGBUR, BILIRUBINUR, KETONESUR, PROTEINUR, UROBILINOGEN, NITRITE, LEUKOCYTESUR,  in the last 72 hours    Imaging: Dg Chest Port 1 View  03/04/2013  *RADIOLOGY REPORT*  Clinical Data: Right pneumothorax  PORTABLE CHEST - 1 VIEW  Comparison: 03/03/2013, and multiple recent priors dating back to 12/12/2012.  Chest CT 12/12/2012 and 01/08/2013.  Findings: The right-sided chest tube is unchanged. Stable heart and mediastinal contours.  There is a stable moderate-sized right hydropneumothorax.  Right paramediastinal and right basilar opacities could reflect atelectasis and/or consolidation.  There is mild interstitial prominence in the left lung.  Small focal opacity at the lateral left lung base could reflect atelectasis or airspace disease.  No definite pleural effusion on the left.  IMPRESSION:  1.  Stable position of right-sided chest tube and unchanged moderate right hydropneumothorax and right basilar and paramediastinal opacities. 2. Slightly decreased aeration of the left lung base could reflect atelectasis or early airspace disease.   Original Report Authenticated By: Curlene Dolphin, M.D.    Dg Chest Port 1  View  03/03/2013  *RADIOLOGY REPORT*  Clinical Data: Post VATS, right-sided pneumothorax  PORTABLE CHEST - 1 VIEW  Comparison: 03/02/2013; 02/27/2013; 02/23/2013  Findings: Grossly unchanged cardiac silhouette and mediastinal contours with obscuration of the right heart border secondary to right mid and lower lung heterogeneous / consolidative opacities. Grossly unchanged appearance of laterally positioned right-sided small to moderate-sized hydropneumothorax.  Stable positioning of right-sided chest tube.  The left hemithorax is unchanged. Unchanged bones.  IMPRESSION: Stable position of support apparatus.  Grossly unchanged small to moderate-sized right-sided hydropneumothorax.   Original Report Authenticated By: Jake Seats, MD      Medications:     . ceFEPime (MAXIPIME) IV  2 g Intravenous Q M,W,F-2000  . darbepoetin  100 mcg Intravenous Q Fri-HD  . lanthanum  1,000 mg Oral TID WC  . multivitamin  1 tablet Oral QHS  . polyethylene glycol  17 g Oral Daily  . warfarin  2.5 mg Oral Q M,W,F-1800  . warfarin  5 mg Oral Q T,Th,S,Su-1800  . Warfarin - Physician Dosing Inpatient   Does not apply q1800   sodium chloride, sodium chloride, alteplase, calcium carbonate, feeding supplement (NEPRO CARB STEADY), heparin, heparin, lanthanum, levalbuterol, lidocaine (PF), lidocaine-prilocaine, ondansetron (ZOFRAN) IV, oxyCODONE-acetaminophen, pentafluoroprop-tetrafluoroeth, potassium chloride, senna-docusate, white petrolatum  Assessment/ Plan: 52 year old BM with ESRD- now s/p additional management of CT/PTX  1. S/P VATS w repeat decortication and surgical repair of air leak 3/14- considering transfer to [facility] for second opinion 2. ESRD, cont mwf HD- normally [facility] unit on [address] via AVF 3. HTN/volume-stable. No meds 4. Anemia of CKD- darbe 100ug/Fri, transfused thisd hosp.  hgb drifting down again 5. Secondary HPTH- phosphorus control improved on Fosrenol/renavite.  No vitamin D and is  hypercalcemic.   patient says that PTH was below 150 so was taken off of sensipar,  recheck PTH pending     LOS: 13 Brooklyne Radke A @TODAY @9 :42 AM

## 2013-03-04 NOTE — Progress Notes (Signed)
Attempted to change pt chest tube drainage system from Sarah to mini express, per MD order, but pt refused transition until he spoke with Dr. Roxan Hockey. Donielle PA was notified and stated Dr. Roxan Hockey would be notified of pt request.

## 2013-03-04 NOTE — Discharge Summary (Signed)
Physician Discharge Summary  Patient ID: [name] [name] MRN: [id number] DOB/AGE: 50-29-1962 50 y.o.  Admit date: 02/19/2013 Discharge date: 03/04/2013  Admission Diagnoses:  Patient Active Problem List  Diagnosis  . End-stage renal disease on hemodialysis  . Secondary hyperparathyroidism  . Hypertension  . Polysubstance abuse  . Anemia of chronic disease  . Tobacco abuse  . Hyperkalemia  . Acute blood loss anemia  . Sinus tachycardia, resolved with PRBC resuscitation  . Lower GI bleed  . ESRD (end stage renal disease)  . H/O: hypertension  . Pleural effusion, right  . Benign neoplasm of colon  . Diverticulosis of colon (without mention of hemorrhage)  . Bacteremia  . Internal jugular vein thrombosis   Discharge Diagnoses:   Patient Active Problem List  Diagnosis  . End-stage renal disease on hemodialysis  . Secondary hyperparathyroidism  . Hypertension  . Polysubstance abuse  . Anemia of chronic disease  . Tobacco abuse  . Hyperkalemia  . Acute blood loss anemia  . Sinus tachycardia, resolved with PRBC resuscitation  . Lower GI bleed  . ESRD (end stage renal disease)  . H/O: hypertension  . Pleural effusion, right  . Benign neoplasm of colon  . Diverticulosis of colon (without mention of hemorrhage)  . Bacteremia  . Internal jugular vein thrombosis  . S/P thoracotomy   Discharged Condition: good  History of Present Illness:   [name] is a 52 yo [place] American man with ESRD who is well known to [facility]. He is S/P Right VATS performed on 12/18/2012 for a loculated right pleural effusion. The patients hospital course was complicated by a persistent air leak in the right sided chest tube. It was felt that it would be unsafe to remove the patient's chest tube and his chest tube was placed to a Mini Express collection chamber and the patient was discharged home on 12/30/2012. The patient resumed his home dialysis regimen. However, on 01/05/2013 during dialysis the patients  session was discontinued after the patient developed a complaint of chills. He was referred to the urgent care clinic where blood cultures were drawn and the patient was given antibiotics prior to being discharged home. However on 01/06/2013 the patient was notified that he had "septic blood" and was readmitted and treated accordingly.  During that hospitalization the patient' chest tube continued to have evidence of an air leak.  He was treated with bedside Talc Pleurodesis during that hospitalization which did show some improvement in his CXR at that time.  He also underwent Bronchoscopy with placement of Endobronchial valves on 01/09/2013.  Since then the patient has been stable.  He underwent repeat Talc Pleurodesis on an outpatient basis.  However, despite conservative treatment options, the patient continued to have an air leak.  He was last evaluated by Dr. Roxan Hockey on 02/10/2013 at which time it was felt that the patients next treatment option would be to undergo repeat right sided Thoracotomy with repair of bronchopleural fistula with Bronchoscopy and removal of endobronchial valves.   The risks and benefits of the procedure were explained to the patient and he was agreeable to proceed.  Surgery was scheduled for 02/19/2013.  Hospital Course:   [name] presented to [facility] on 02/19/2013.  He was taken to the operating room and underwent Bronchoscopy with removal of intrabronchial valves from the segmental portion of the right lower lobe.  He also underwent Right Thoracotomy, Decortication and suture repair of air leak.  Also during the procedure cultures were obtained.  The patient tolerated the procedure well, was extubated and taken to the PACU in stable condition.  The patient was monitored by Nephrology throughout his hospital stay and received dialysis per his outpatient regimen.  He did receive transfusion of blood products during hospitalization at dialysis.    The cultures obtained in  the OR were positive for Pseudomonas Stutzeri and he was placed on Cefepime.  He will require 6 weeks of therapy with completion date of 04/09/2013.  Initially the patient's chest tube did not show evidence of an air leak and was placed on water seal.  However, after removal of his posterior chest tube an air was present requiring the remaining chest tube to be placed back on suction.  The patient continued to have an air leak.  However it did decrease in size.  The patient's chest tube was placed on water seal due to patient's discomfort while on suction.  It was felt the patient would most likely require intervention from plastic surgery for a possible flap closure utilizing muscle to fill the pleural space.  Plastic surgery was consulted and felt the patient would not be the best candidate for a Latissimus flap due to the location of his surgical incision.  He was felt to be possibly be a better candidate for a right pectoralis with supplementation of omentum if necessary.  The risks and benefits of the procedures were explained to the patient.  However he would like to obtain a second opinion at a teaching facility. Dr. Roxan Hockey has spoken with Dr. Wynelle Cleveland at [facility], and he will arrange outpatient follow up consultation for next week.   The patient is medically stable at this time.  We will place his chest tube to a portable Mini Express Chest tube collection.  He will follow up with Dr. Roxan Hockey in 1 weeks with a CXR.     Consults: nephrology and Plastic Surgery  Treatments: surgery:   1. Video bronchoscopy with removal of intrabronchial valves from  segmental bronchi of the right lower lobe.  2. Right thoracotomy, decortication, suture repair of air leak.  Disposition: 01-Home or Self Care   Future Appointments Provider Department Dept Phone   03/17/2013 12:00 PM Melrose Nakayama, MD [facility] [phone]        Medication List    TAKE these medications       calcium acetate 667 MG capsule  Commonly known as:  PHOSLO  Take 2,668 mg by mouth 3 (three) times daily with meals.     calcium acetate 667 MG capsule  Commonly known as:  PHOSLO  Take 2,001 mg by mouth with snacks.     dextromethorphan 30 MG/5ML liquid  Commonly known as:  DELSYM  Take 10 mLs (60 mg total) by mouth 2 (two) times daily as needed for cough.     dextrose 5 % SOLN 50 mL with ceFEPIme 2 G SOLR  2g in D5/50 ml IVPB run 30 minutes during dialysis every MWF    Completion Date is 04/09/2013     guaiFENesin 600 MG 12 hr tablet  Commonly known as:  MUCINEX  Take 2 tablets (1,200 mg total) by mouth 2 (two) times daily.     metoprolol tartrate 25 MG tablet  Commonly known as:  LOPRESSOR  Take 25 mg by mouth 2 (two) times daily.     multivitamin Tabs tablet  Take 1 tablet by mouth at bedtime.     oxyCODONE-acetaminophen 5-325 MG  per tablet  Commonly known as:  PERCOCET/ROXICET  Take 1-2 tablets by mouth every 4 (four) hours as needed.     warfarin 2.5 MG tablet  Commonly known as:  COUMADIN  Take 1 tablet (2.5 mg total) by mouth every Monday, Wednesday, and Friday at 6 PM.     warfarin 5 MG tablet  Commonly known as:  COUMADIN  Take 1 tablet (5 mg total) by mouth every Tuesday, Thursday, Saturday, and Sunday at 6 PM.       Follow-up Information   Follow up with Melrose Nakayama, MD On 03/10/2013. (Have a chest x-ray at [facility] at 10:45, then see MD at 11:45)    Contact information:   [address] [place] [address] [phone]       Follow up with Dr. Wynelle Cleveland ([facility]). (Office will call to schedule an appointment for next week)      Signed: BARRETT, ERIN 03/04/2013, 2:17 PM

## 2013-03-04 NOTE — Procedures (Signed)
Patient was seen on dialysis and the procedure was supervised.  BFR 400  Via AVF BP is  129/79.   Patient appears to be tolerating treatment well  Maye Parkinson A 03/04/2013

## 2013-03-05 ENCOUNTER — Inpatient Hospital Stay (HOSPITAL_COMMUNITY): Payer: Medicare Other

## 2013-03-05 LAB — PROTIME-INR: Prothrombin Time: 17.3 seconds — ABNORMAL HIGH (ref 11.6–15.2)

## 2013-03-05 MED ORDER — DEXTROMETHORPHAN POLISTIREX 30 MG/5ML PO LQCR
10.0000 mL | Freq: Two times a day (BID) | ORAL | Status: DC
  Administered 2013-03-05 – 2013-03-06 (×3): 60 mg via ORAL
  Filled 2013-03-05 (×4): qty 10

## 2013-03-05 MED ORDER — GUAIFENESIN ER 600 MG PO TB12
1200.0000 mg | ORAL_TABLET | Freq: Two times a day (BID) | ORAL | Status: DC
  Administered 2013-03-05 – 2013-03-06 (×3): 1200 mg via ORAL
  Filled 2013-03-05 (×4): qty 2

## 2013-03-05 MED ORDER — WARFARIN SODIUM 5 MG PO TABS
5.0000 mg | ORAL_TABLET | Freq: Every day | ORAL | Status: DC
  Administered 2013-03-05: 5 mg via ORAL
  Filled 2013-03-05 (×2): qty 1

## 2013-03-05 NOTE — Progress Notes (Addendum)
14 Days Post-Op Procedure(s) (LRB): VIDEO BRONCHOSCOPY WITH INSERTION OF INTERBRONCHIAL VALVE (IBV) (Right) VIDEO ASSISTED THORACOSCOPY (VATS)/THOROCOTOMY (Right) Subjective:  [name] [name] states he has been up all night coughing.  He has been unable to get the coughing to stop unless he is sitting up and leaning forward.  He states this usually happens when is fluid overloaded.  He states that he does not want to go home and be coughing all of the time.  He is requesting to speak with Dr. Roxan Hockey  Objective: Vital signs in last 24 hours: Temp:  [97.9 F (36.6 C)-99.7 F (37.6 C)] 99 F (37.2 C) (03/27 0726) Pulse Rate:  [88-110] 92 (03/27 0726) Cardiac Rhythm:  [-] Sinus tachycardia (03/27 0330) Resp:  [15-21] 17 (03/27 0726) BP: (104-138)/(63-104) 116/73 mmHg (03/27 0726) SpO2:  [96 %-100 %] 96 % (03/27 0726) Weight:  [210 lb 12.2 oz (95.6 kg)] 210 lb 12.2 oz (95.6 kg) (03/26 0911)  Intake/Output from previous day: 03/26 0701 - 03/27 0700 In: 890 [P.O.:840; IV Piggyback:50] Out: 3000   General appearance: alert, cooperative and no distress Heart: regular rate and rhythm Lungs: diminished breath sounds on the right Abdomen: soft, non-tender; bowel sounds normal; no masses,  no organomegaly Extremities: extremities normal, atraumatic, no cyanosis or edema Wound: clean and dry, warm to touch, no erythema present  Lab Results:  Recent Labs  03/04/13 0930  WBC 9.7  HGB 8.2*  HCT 25.5*  PLT 230   BMET:  Recent Labs  03/04/13 0930  NA 136  K 4.9  CL 97  CO2 26  GLUCOSE 129*  BUN 49*  CREATININE 10.67*  CALCIUM 10.4    PT/INR:  Recent Labs  03/05/13 0440  LABPROT 17.3*  INR 1.46   ABG    Component Value Date/Time   PHART 7.377 02/20/2013 0425   HCO3 27.5* 02/20/2013 0425   TCO2 29 02/20/2013 0425   O2SAT 93.0 02/20/2013 0425   CBG (last 3)  No results found for this basename: GLUCAP,  in the last 72 hours  Assessment/Plan: S/P Procedure(s)  (LRB): VIDEO BRONCHOSCOPY WITH INSERTION OF INTERBRONCHIAL VALVE (IBV) (Right) VIDEO ASSISTED THORACOSCOPY (VATS)/THOROCOTOMY (Right)  1. Chest tube- continued air leak, no evidence of pleural effusion on CXR, lung no re-expanded- patient refused transition to [facility] until he speaks with surgeon 2. Cough 3. Wound- I removed every other staple would plan to remove the remaining in 1 week 4. Renal- ESRD- dialysis tomorrow, Nephrology following 5. Dispo- plan is to discharge today, however patient wishes to speak with surgeon regarding discharge plans, does not feel he should leave the hospital   LOS: 14 days    Ahmed Prima, [place] 03/05/2013  Patient seen and examined. Agree with above. He is having a lot of coughing. The right lung is very atelectatic but is unchanged.- will try back on suction to see if it makes a difference, but I doubt it will Will keep in the hospital today to work on respiratory status.

## 2013-03-05 NOTE — Progress Notes (Signed)
Utilization review completed.  

## 2013-03-05 NOTE — Progress Notes (Signed)
[facility] ROUNDING NOTE   Subjective:   Tolerated HD yest, removed 3 liters.  Had issues overnight of coughing says feels like he has fluid  ? But didn't have these sxms the night before when he was 3 liters heavier?.  Considering transfer to tertiary care center ([facility])- no concrete plans   Objective:  Vital signs in last 24 hours:  Temp:  [97.9 F (36.6 C)-99.7 F (37.6 C)] 99 F (37.2 C) (03/27 0800) Pulse Rate:  [88-110] 92 (03/27 0800) Resp:  [15-21] 19 (03/27 0800) BP: (104-138)/(63-104) 116/73 mmHg (03/27 0800) SpO2:  [96 %-100 %] 98 % (03/27 0800) Weight:  [95.6 kg (210 lb 12.2 oz)] 95.6 kg (210 lb 12.2 oz) (03/26 0911)  Weight change:  Filed Weights   03/02/13 0650 03/02/13 1047 03/04/13 0911  Weight: 96 kg (211 lb 10.3 oz) 93.5 kg (206 lb 2.1 oz) 95.6 kg (210 lb 12.2 oz)    Intake/Output: I/O last 3 completed shifts: In: 1130 [P.O.:1080; IV Piggyback:50] Out: 3000 [Other:3000]   Intake/Output this shift:     General appearance: alert and no distress  Heart: regular rate and rhythm  Lungs: wheezes RLL  large tidal variation with cough AVF thrill and bruit  No lower extremity edema    Basic Metabolic Panel:  Recent Labs Lab 02/27/13 0709 03/02/13 0710 03/04/13 0930  NA 137 138 136  K 4.6 5.1 4.9  CL 96 97 97  CO2 26 27 26   GLUCOSE 96 102* 129*  BUN 46* 51* 49*  CREATININE 10.45* 12.21* 10.67*  CALCIUM 10.0 10.2 10.4  PHOS 5.1* 4.8* 3.7    Liver Function Tests:  Recent Labs Lab 02/27/13 0709 03/02/13 0710 03/04/13 0930  ALBUMIN 2.0* 2.0* 2.1*   No results found for this basename: LIPASE, AMYLASE,  in the last 168 hours No results found for this basename: AMMONIA,  in the last 168 hours  CBC:  Recent Labs Lab 02/27/13 0709 03/02/13 0710 03/04/13 0930  WBC 6.6 7.3 9.7  HGB 8.1* 7.7* 8.2*  HCT 25.0* 23.5* 25.5*  MCV 90.3 90.4 91.4  PLT 197 204 230    Cardiac Enzymes: No results found for this basename: CKTOTAL,  CKMB, CKMBINDEX, TROPONINI,  in the last 168 hours  BNP: No components found with this basename: POCBNP,   CBG: No results found for this basename: GLUCAP,  in the last 168 hours  Microbiology: Results for orders placed during the hospital encounter of 02/19/13  TISSUE CULTURE     Status: None   Collection Time    02/19/13 12:19 PM      Result Value Range Status   Specimen Description TISSUE PLEURAL RIGHT   Final   Special Requests RIGHT PLEURAL PEEL PATIENT ON FOLLOWING ZINACEF   Final   Gram Stain     Final   Value: FEW WBC PRESENT,BOTH PMN AND MONONUCLEAR     NO ORGANISMS SEEN   Culture PSEUDOMONAS STUTZERI   Final   Report Status 02/23/2013 FINAL   Final   Organism ID, Bacteria PSEUDOMONAS STUTZERI   Final    Coagulation Studies:  Recent Labs  03/03/13 0445 03/04/13 0450 03/05/13 0440  LABPROT 17.6* 17.8* 17.3*  INR 1.49 1.51* 1.46    Urinalysis: No results found for this basename: COLORURINE, APPERANCEUR, LABSPEC, PHURINE, GLUCOSEU, HGBUR, BILIRUBINUR, KETONESUR, PROTEINUR, UROBILINOGEN, NITRITE, LEUKOCYTESUR,  in the last 72 hours    Imaging: Dg Chest Port 1 View  03/05/2013  *RADIOLOGY REPORT*  Clinical Data: Status post right  VATS surgery.  PORTABLE CHEST - 1 VIEW  Comparison: 03/04/2013  Findings: Overall aeration of the right lung with a single chest tube remaining is stable with persistent lack of complete expansion of the right lung.  Ex vacuo air in the right pleural space is stable.  The left lung shows slightly lower volume with mild basilar atelectasis.  IMPRESSION: Stable appearance of lack of complete re-expansion of the right lung with stable ex vacuo air in the right pleural space.   Original Report Authenticated By: Aletta Edouard, M.D.    Dg Chest Port 1 View  03/04/2013  *RADIOLOGY REPORT*  Clinical Data: Right pneumothorax  PORTABLE CHEST - 1 VIEW  Comparison: 03/03/2013, and multiple recent priors dating back to 12/12/2012.  Chest CT 12/12/2012 and  01/08/2013.  Findings: The right-sided chest tube is unchanged. Stable heart and mediastinal contours.  There is a stable moderate-sized right hydropneumothorax.  Right paramediastinal and right basilar opacities could reflect atelectasis and/or consolidation.  There is mild interstitial prominence in the left lung.  Small focal opacity at the lateral left lung base could reflect atelectasis or airspace disease.  No definite pleural effusion on the left.  IMPRESSION:  1.  Stable position of right-sided chest tube and unchanged moderate right hydropneumothorax and right basilar and paramediastinal opacities. 2. Slightly decreased aeration of the left lung base could reflect atelectasis or early airspace disease.   Original Report Authenticated By: Curlene Dolphin, M.D.      Medications:     . ceFEPime (MAXIPIME) IV  2 g Intravenous Q M,W,F-2000  . darbepoetin  100 mcg Intravenous Q Fri-HD  . lanthanum  1,000 mg Oral TID WC  . multivitamin  1 tablet Oral QHS  . polyethylene glycol  17 g Oral Daily  . warfarin  2.5 mg Oral Q M,W,F-1800  . warfarin  5 mg Oral Q T,Th,S,Su-1800  . Warfarin - Physician Dosing Inpatient   Does not apply q1800   sodium chloride, sodium chloride, calcium carbonate, feeding supplement (NEPRO CARB STEADY), heparin, lanthanum, levalbuterol, lidocaine (PF), lidocaine-prilocaine, ondansetron (ZOFRAN) IV, oxyCODONE-acetaminophen, pentafluoroprop-tetrafluoroeth, potassium chloride, senna-docusate, white petrolatum  Assessment/ Plan: 52 year old BM with ESRD- now s/p additional management of CT/PTX  1. S/P VATS w repeat decortication and surgical repair of air leak 3/14- considering transfer to [facility] for second opinion 2. ESRD, cont mwf HD- normally [facility] unit on [address] via AVF- plan for next HD tomorrow, will increase UF? 3. HTN/volume-stable. No meds 4. Anemia of CKD- darbe 100ug/Fri, transfused thisd hosp.  hgb drifting down again 5. Secondary HPTH- phosphorus control  improved on Fosrenol/renavite.  No vitamin D and is hypercalcemic.   patient says that PTH was below 150 at OP unit so was taken off of sensipar,  recheck PTH pending 6. Cough- not sure I can attribute to volume as pt was dialyzed yest.  I think plan was to discharge but it doesn't seem like its going to happen     LOS: 14 Tevis Conger A @TODAY @8 :58 AM

## 2013-03-06 ENCOUNTER — Encounter (HOSPITAL_COMMUNITY): Payer: Self-pay | Admitting: *Deleted

## 2013-03-06 ENCOUNTER — Inpatient Hospital Stay (HOSPITAL_COMMUNITY): Payer: Medicare Other

## 2013-03-06 LAB — CBC
MCH: 29 pg (ref 26.0–34.0)
MCV: 91.2 fL (ref 78.0–100.0)
Platelets: 227 10*3/uL (ref 150–400)
RBC: 2.72 MIL/uL — ABNORMAL LOW (ref 4.22–5.81)
RDW: 17 % — ABNORMAL HIGH (ref 11.5–15.5)

## 2013-03-06 LAB — PROTIME-INR
INR: 1.49 (ref 0.00–1.49)
Prothrombin Time: 17.6 seconds — ABNORMAL HIGH (ref 11.6–15.2)

## 2013-03-06 LAB — RENAL FUNCTION PANEL
Albumin: 2.1 g/dL — ABNORMAL LOW (ref 3.5–5.2)
BUN: 44 mg/dL — ABNORMAL HIGH (ref 6–23)
Chloride: 99 mEq/L (ref 96–112)
Creatinine, Ser: 9.6 mg/dL — ABNORMAL HIGH (ref 0.50–1.35)
GFR calc non Af Amer: 6 mL/min — ABNORMAL LOW (ref >90)
Phosphorus: 4.5 mg/dL (ref 2.3–4.6)
Potassium: 4.7 mEq/L (ref 3.5–5.1)

## 2013-03-06 MED ORDER — DARBEPOETIN ALFA-POLYSORBATE 100 MCG/0.5ML IJ SOLN
INTRAMUSCULAR | Status: AC
  Administered 2013-03-06: 100 ug via INTRAVENOUS
  Filled 2013-03-06: qty 0.5

## 2013-03-06 MED ORDER — GUAIFENESIN ER 600 MG PO TB12: 1200.0000 mg | ORAL_TABLET | Freq: Two times a day (BID) | ORAL | Status: DC

## 2013-03-06 MED ORDER — LIDOCAINE HCL (PF) 1 % IJ SOLN: 5.0000 mL | INTRAMUSCULAR | Status: DC | PRN

## 2013-03-06 MED ORDER — OXYCODONE-ACETAMINOPHEN 5-325 MG PO TABS
ORAL_TABLET | ORAL | Status: AC
  Administered 2013-03-06: 2 via ORAL
  Filled 2013-03-06: qty 2

## 2013-03-06 MED ORDER — SODIUM CHLORIDE 0.9 % IV SOLN: 100.0000 mL | INTRAVENOUS | Status: DC | PRN

## 2013-03-06 MED ORDER — HEPARIN SODIUM (PORCINE) 1000 UNIT/ML DIALYSIS: 1000.0000 [IU] | INTRAMUSCULAR | Status: DC | PRN

## 2013-03-06 MED ORDER — LIDOCAINE-PRILOCAINE 2.5-2.5 % EX CREA: 1.0000 "application " | TOPICAL_CREAM | CUTANEOUS | Status: DC | PRN

## 2013-03-06 MED ORDER — ALTEPLASE 2 MG IJ SOLR: 2.0000 mg | Freq: Once | INTRAMUSCULAR | Status: DC | PRN

## 2013-03-06 MED ORDER — DEXTROMETHORPHAN POLISTIREX 30 MG/5ML PO LQCR: 60.0000 mg | Freq: Two times a day (BID) | ORAL | Status: DC | PRN

## 2013-03-06 MED ORDER — PENTAFLUOROPROP-TETRAFLUOROETH EX AERO: 1.0000 "application " | INHALATION_SPRAY | CUTANEOUS | Status: DC | PRN

## 2013-03-06 MED ORDER — HEPARIN SODIUM (PORCINE) 1000 UNIT/ML DIALYSIS
20.0000 [IU]/kg | INTRAMUSCULAR | Status: DC | PRN
Start: 2013-03-06 — End: 2013-03-06

## 2013-03-06 MED ORDER — NEPRO/CARBSTEADY PO LIQD: 237.0000 mL | ORAL | Status: DC | PRN

## 2013-03-06 NOTE — Procedures (Signed)
Patient was seen on dialysis and the procedure was supervised.  BFR 300  Via AVF BP is  103/55.   Patient appears to be tolerating treatment well  [name] [name] A 03/06/2013

## 2013-03-06 NOTE — Progress Notes (Signed)
Patient discharged home with wife and son via private vehicle.  Patient left via wheelchair with student nurse Janett Billow.  Patient chest tube switched to a mini express before discharge. Vitals stable and IV discontinued. Patient given prescription for percocet, mucinex, and delsym.  Education given regarding care for chest tube.  Follow up visits addressed. Education given to wife and patient and given time to ask questions.

## 2013-03-06 NOTE — Progress Notes (Deleted)
Patient discharged home via private vehicle by wife and daughter. Patient education completed on when to call MD and when to schedule follow up visits.  Patient teaching also explained to daughter as well.  IV discontinued, vital signs stable.  Patient was given time to ask questions or voice any concerns.

## 2013-03-06 NOTE — Progress Notes (Signed)
[address]            [place] [address]          [phone]     15 Days Post-Op Procedure(s) (LRB): VIDEO BRONCHOSCOPY WITH INSERTION OF INTERBRONCHIAL VALVE (IBV) (Right) VIDEO ASSISTED THORACOSCOPY (VATS)/THOROCOTOMY (Right)  Subjective: Back from HD.  Feels much better today. Cough improved after cough meds.     Objective: Vital signs in last 24 hours: Patient Vitals for the past 24 hrs:  BP Temp Temp src Pulse Resp SpO2 Weight  03/06/13 1145 101/63 mmHg 98.7 F (37.1 C) Oral 113 - - -  03/06/13 1103 114/67 mmHg 98.2 F (36.8 C) Oral 106 15 98 % 198 lb 10.2 oz (90.1 kg)  03/06/13 1057 110/62 mmHg - - 112 13 97 % -  03/06/13 1030 106/65 mmHg - - 113 12 96 % -  03/06/13 1000 124/71 mmHg - - 115 15 98 % -  03/06/13 0930 113/63 mmHg - - 115 16 97 % -  03/06/13 0900 120/70 mmHg - - 96 13 96 % -  03/06/13 0830 116/75 mmHg - - 96 23 99 % -  03/06/13 0800 128/76 mmHg - - 99 14 - -  03/06/13 0730 - - - 91 14 97 % -  03/06/13 0700 130/83 mmHg - - 88 17 97 % -  03/06/13 0653 137/80 mmHg - - 89 16 99 % -  03/06/13 0638 132/77 mmHg 98.2 F (36.8 C) Oral 97 11 98 % 206 lb 2.1 oz (93.5 kg)  03/06/13 0400 120/78 mmHg 98.4 F (36.9 C) Oral 99 20 100 % -  03/06/13 0000 126/76 mmHg 97.5 F (36.4 C) Oral 95 16 100 % -  03/05/13 1900 119/76 mmHg 97.9 F (36.6 C) Oral - 16 97 % -  03/05/13 1600 123/74 mmHg 99 F (37.2 C) Oral 91 13 98 % -   Current Weight  03/06/13 198 lb 10.2 oz (90.1 kg)     Intake/Output from previous day: 03/27 0701 - 03/28 0700 In: 480 [P.O.:480] Out: 50 [Chest Tube:50]    PHYSICAL EXAM:  Heart: RRR Lungs: decreased BS R base Wound: Clean and dry  Chest tube: + air leak   Lab Results: CBC: Recent Labs  03/04/13 0930 03/06/13 0651  WBC 9.7 7.0  HGB 8.2* 7.9*  HCT 25.5* 24.8*  PLT 230 227   BMET:  Recent Labs  03/04/13 0930 03/06/13 0652  NA 136 139  K 4.9 4.7  CL 97 99  CO2 26 28  GLUCOSE  129* 124*  BUN 49* 44*  CREATININE 10.67* 9.60*  CALCIUM 10.4 10.4    PT/INR:  Recent Labs  03/06/13 0325  LABPROT 17.6*  INR 1.49      Assessment/Plan: S/P Procedure(s) (LRB): VIDEO BRONCHOSCOPY WITH INSERTION OF INTERBRONCHIAL VALVE (IBV) (Right) VIDEO ASSISTED THORACOSCOPY (VATS)/THOROCOTOMY (Right) Stable persistent air leak.  No change on suction. Pt feels that cough medicine has helped significantly and wants to go home.   Dr. Roxan Hockey has spoken with Dr. Wynelle Cleveland at [facility], and they will contact the patient with an appointment for outpatient evaluation for definitive closure of his leak.  They have requested a CT of the chest and abdomen, and I will order this to be done today. Discharge home with [facility] later today after CT completed.  F/u in our office in 1 week for staple  removal.   LOS: 15 days    Ryan Palermo H 03/06/2013

## 2013-03-06 NOTE — Progress Notes (Signed)
ANTIBIOTIC CONSULT NOTE - FOLLOW UP  Pharmacy Consult for cefepime Indication: Pseudomonas infection of R pleural tissue   Allergies  Allergen Reactions  . Tape Other (See Comments)    blister    Patient Measurements: Height: 6\' 1"  (185.4 cm) Weight: 198 lb 10.2 oz (90.1 kg) IBW/kg (Calculated) : 79.9   Vital Signs: Temp: 98.7 F (37.1 C) (03/28 1145) Temp src: Oral (03/28 1145) BP: 101/63 mmHg (03/28 1145) Pulse Rate: 113 (03/28 1145) Intake/Output from previous day: 03/27 0701 - 03/28 0700 In: 480 [P.O.:480] Out: 50 [Chest Tube:50] Intake/Output from this shift: Total I/O In: -  Out: 3000 [Other:3000]  Labs:  Recent Labs  03/04/13 0930 03/06/13 0651 03/06/13 0652  WBC 9.7 7.0  --   HGB 8.2* 7.9*  --   PLT 230 227  --   CREATININE 10.67*  --  9.60*   Estimated Creatinine Clearance: 10.5 ml/min (by C-G formula based on Cr of 9.6). No results found for this basename: VANCOTROUGH, Corlis Leak, VANCORANDOM, GENTTROUGH, GENTPEAK, GENTRANDOM, TOBRATROUGH, TOBRAPEAK, TOBRARND, AMIKACINPEAK, AMIKACINTROU, AMIKACIN,  in the last 72 hours   Microbiology: Recent Results (from the past 720 hour(s))  SURGICAL PCR SCREEN     Status: None   Collection Time    02/17/13  1:44 PM      Result Value Range Status   MRSA, PCR NEGATIVE  NEGATIVE Final   Staphylococcus aureus NEGATIVE  NEGATIVE Final   Comment:            The Xpert SA Assay (FDA     approved for NASAL specimens     in patients over 32 years of age),     is one component of     a comprehensive surveillance     program.  Test performance has     been validated by [employer] for patients greater     than or equal to 61 year old.     It is not intended     to diagnose infection nor to     guide or monitor treatment.  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 WBC down to nl at 7, without fevers. HD earlier today.  Plan:  1) continue current cefepime regimen of 2gm after each HD session. 2) INR slowly increasing, MD dosing 5mg  daily  Erin Hearing PharmD., BCPS Clinical Pharmacist Pager [phone] 03/06/2013 1:22 PM

## 2013-03-08 NOTE — Care Management Note (Signed)
    Page 1 of 2   03/08/2013     9:44:01 AM   CARE MANAGEMENT NOTE 03/08/2013  Patient:  [name] [name], [name] [name]   Account Number:  [id number]  Date Initiated:  02/20/2013  Documentation initiated by:  [facility]  Subjective/Objective Assessment:   Post op thoracotomy on 3-13-  Has spouse     Action/Plan:   NCM following for d/c needs/planning-   Anticipated DC Date:  03/06/2013   Anticipated DC Plan:  [place]  CM consult      [facility] Choice  Resumption Of Svcs/PTA Provider   Choice offered to / List presented to:             [place]   Status of service:  Completed, signed off Medicare Important Message given?   (If response is "NO", the following Medicare IM given date fields will be blank) Date Medicare IM given:   Date Additional Medicare IM given:    Discharge Disposition:  [place]  Per UR Regulation:  Reviewed for med. necessity/level of care/duration of stay  If discussed at [place] of Stay Meetings, dates discussed:   02/26/2013  03/05/2013    Comments:  Contact:  Foister,Margaret Spouse [phone]                 [place], Sorich Son [id number]                 [facility] Mother [phone]  03/06/13- 35- Marvetta Gibbons RN, BSN [phone] Pt discharged home today- no order to resume [place] - but pt did go [facility] and will need HH resumed- Spoke with Stanton Kidney from [place]- regarding pt's discharge- she is to call MD office regarding [place] orders.  03/05/13- [place] RN, BSN [phone] Aniticipated d/c home today with [facility]- pt had to be placed back to Sx on chest tube- d/c on hold for today- will reassess in am- if pt needs cont. suction may need to consider LTACH referral.  03/02/13- [place] RN, BSN [phone] Pt still with persistent air leak and chest tube to waterseal-  ? possible muscle flaps to fill pleural space. MD has offered him  the option of transfer to a tertiary center given our limited experience with this specific procedure here. Pt to consider his options- may need acute to acute tx.   02/24/13- 1645- Marvetta Gibbons RN, BSN [phone] Pt active with [facility] for HH-RN, NCM to follow for d/c needs/planning

## 2013-03-09 ENCOUNTER — Other Ambulatory Visit: Payer: Self-pay | Admitting: *Deleted

## 2013-03-09 DIAGNOSIS — Z9889 Other specified postprocedural states: Secondary | ICD-10-CM

## 2013-03-10 ENCOUNTER — Ambulatory Visit
Admission: RE | Admit: 2013-03-10 | Discharge: 2013-03-10 | Disposition: A | Discharge status: Home / Self Care | Payer: Medicare Other | Source: Ambulatory Visit | Attending: Thoracic Surgery (Cardiothoracic Vascular Surgery) | Admitting: Thoracic Surgery (Cardiothoracic Vascular Surgery)

## 2013-03-10 ENCOUNTER — Encounter: Payer: Self-pay | Admitting: Thoracic Surgery (Cardiothoracic Vascular Surgery)

## 2013-03-10 ENCOUNTER — Ambulatory Visit (INDEPENDENT_AMBULATORY_CARE_PROVIDER_SITE_OTHER): Payer: Self-pay | Admitting: Thoracic Surgery (Cardiothoracic Vascular Surgery)

## 2013-03-10 VITALS — BP 141/89 | HR 100 | Resp 20 | Ht 73.0 in | Wt 202.0 lb

## 2013-03-10 DIAGNOSIS — Z09 Encounter for follow-up examination after completed treatment for conditions other than malignant neoplasm: Secondary | ICD-10-CM

## 2013-03-10 DIAGNOSIS — Z9889 Other specified postprocedural states: Secondary | ICD-10-CM

## 2013-03-10 DIAGNOSIS — G8918 Other acute postprocedural pain: Secondary | ICD-10-CM

## 2013-03-10 DIAGNOSIS — J9382 Other air leak: Secondary | ICD-10-CM

## 2013-03-10 DIAGNOSIS — J90 Pleural effusion, not elsewhere classified: Secondary | ICD-10-CM

## 2013-03-10 DIAGNOSIS — IMO0002 Reserved for concepts with insufficient information to code with codable children: Secondary | ICD-10-CM

## 2013-03-10 MED ORDER — OXYCODONE-ACETAMINOPHEN 5-325 MG PO TABS: 1.0000 | ORAL_TABLET | Freq: Four times a day (QID) | ORAL | Status: DC | PRN

## 2013-03-10 NOTE — Progress Notes (Signed)
HPI:  [name] [name] returns today after being discharged the hospital on Friday. He says that he has had trouble sleeping the last 2 nights. He has been coughing more frequently. He is having quite of pain and is using oxycodone on a regular basis. He's not had any fevers or chills. His breathing is stable.  Past Medical History  Diagnosis Date  . Secondary hyperparathyroidism   . Hypertension   . Polysubstance abuse   . Anemia of chronic disease     BL Hgb ~10  . Tobacco abuse   . CHF (congestive heart failure)   . Pneumonia     hx of  . End-stage renal disease on hemodialysis     HD m/w/f - right arm graft  . History of blood transfusion       Current Outpatient Prescriptions  Medication Sig Dispense Refill  . calcium acetate (PHOSLO) 667 MG capsule Take 2,668 mg by mouth 3 (three) times daily with meals.       Marland Kitchen dextromethorphan (DELSYM) 30 MG/5ML liquid Take 10 mLs (60 mg total) by mouth 2 (two) times daily as needed for cough.    0  . dextrose 5 % SOLN 50 mL with ceFEPIme 2 G SOLR 2g in D5/50 ml IVPB run 30 minutes during dialysis every MWF  Completion Date is 04/09/2013      . guaiFENesin (MUCINEX) 600 MG 12 hr tablet Take 2 tablets (1,200 mg total) by mouth 2 (two) times daily.  60 tablet  0  . multivitamin (RENA-VIT) TABS tablet Take 1 tablet by mouth at bedtime.      Marland Kitchen oxyCODONE-acetaminophen (PERCOCET/ROXICET) 5-325 MG per tablet Take 1-2 tablets by mouth every 6 (six) hours as needed.  40 tablet  0  . warfarin (COUMADIN) 2.5 MG tablet Take 1 tablet (2.5 mg total) by mouth every Monday, Wednesday, and Friday at 6 PM.  30 tablet  1  . warfarin (COUMADIN) 5 MG tablet Take 1 tablet (5 mg total) by mouth every Tuesday, Thursday, Saturday, and Sunday at 6 PM.  30 tablet  1  . metoprolol tartrate (LOPRESSOR) 25 MG tablet Take 25 mg by mouth 2 (two) times daily.       No current facility-administered medications for this visit.    Physical Exam BP 141/89  Pulse 100  Resp 20   Ht 6\' 1"  (1.854 m)  Wt 202 lb (91.627 kg)  BMI 26.66 kg/m2  SpO21 40% 52 year old male in no acute distress, but obviously uncomfortable Absent breath sounds on right Clear breath sounds on left Chest tube site okay Wound well healed, remaining staples removed.  Diagnostic Tests: Chest x-ray shows further collapse of right lung, chest tube in place  Impression: [name] [name] is a 52 year old gentleman who has a persistent air leak and now a space issue after initial decortication for a fibrothorax and then a reoperation to try to correct a persistent air leak. I have sent his CT scan to Dr. Felicie Morn at [facility] for an opinion as to how best to proceed. I suspect that he will need a repeat thoracotomy to get the lung to reexpand and then muscle and/or omental flaps to fill the residual space. A CT scan was sent yesterday if we would her back from [facility] in the next day or 2. I did give him a prescription for an additional 40 oxycodone tablets.  Plan: Continue with chest tube drainage for now  Arrange consultation with Dr. Felicie Morn at [facility]

## 2013-03-17 ENCOUNTER — Ambulatory Visit: Payer: Medicare Other | Admitting: Thoracic Surgery (Cardiothoracic Vascular Surgery)

## 2013-03-17 ENCOUNTER — Ambulatory Visit (INDEPENDENT_AMBULATORY_CARE_PROVIDER_SITE_OTHER): Payer: Self-pay | Admitting: Thoracic Surgery (Cardiothoracic Vascular Surgery)

## 2013-03-17 ENCOUNTER — Ambulatory Visit
Admission: RE | Admit: 2013-03-17 | Discharge: 2013-03-17 | Disposition: A | Discharge status: Home / Self Care | Payer: Medicare Other | Source: Ambulatory Visit | Attending: Cardiothoracic Surgery | Admitting: Cardiothoracic Surgery

## 2013-03-17 ENCOUNTER — Other Ambulatory Visit: Payer: Self-pay | Admitting: *Deleted

## 2013-03-17 ENCOUNTER — Encounter: Payer: Self-pay | Admitting: Thoracic Surgery (Cardiothoracic Vascular Surgery)

## 2013-03-17 VITALS — BP 147/92 | HR 97 | Resp 20 | Ht 73.0 in | Wt 202.0 lb

## 2013-03-17 DIAGNOSIS — Z9889 Other specified postprocedural states: Secondary | ICD-10-CM

## 2013-03-17 DIAGNOSIS — J9382 Other air leak: Secondary | ICD-10-CM

## 2013-03-17 DIAGNOSIS — J90 Pleural effusion, not elsewhere classified: Secondary | ICD-10-CM

## 2013-03-17 DIAGNOSIS — IMO0002 Reserved for concepts with insufficient information to code with codable children: Secondary | ICD-10-CM

## 2013-03-17 NOTE — Progress Notes (Signed)
Patient ID: [name] [name], male   DOB: 03-23-1961, 52 y.o.   MRN: [id number]   [name] [name] returns to the office today for followup.  He feels very tired today. He says he did not sleep well last night. He tried to sleep in the bed light of his back and had a lot of coughing and then was uncomfortable and unable to sleep in a recliner. He is out of pain medication. He denies any fevers or chills. Says his breathing is unchanged but he just doesn't have any energy. He is having a lot of pain at the chest tube site today  BP 147/92  Pulse 97  Resp 20  Ht 6\' 1"  (1.854 m)  Wt 202 lb (91.627 kg)  BMI 26.66 kg/m2  SpO2 96%  Lungs absent breath sounds on right  Chest tube in place, no sign of infection at entry site.  [name] [name] is a 52 year old gentleman who had a persistent air leak after decortication for fibrothorax. He has been referred to Dr. Felicie Morn at [facility] for assessment. In the last couple of weeks as long as become progressively more atelectatic and now essentially is 100% collapsed. He has a large space. He will need a repeat decortication and flaps (muscle and/or omental) to manage this problem.  I will plan to see him back next week unless he is seeing Dr. Wynelle [name], in which case I will see him the following week.

## 2013-03-18 DIAGNOSIS — J939 Pneumothorax, unspecified: Secondary | ICD-10-CM | POA: Insufficient documentation

## 2013-03-18 DIAGNOSIS — IMO0002 Reserved for concepts with insufficient information to code with codable children: Secondary | ICD-10-CM | POA: Insufficient documentation

## 2013-03-18 DIAGNOSIS — J9382 Other air leak: Secondary | ICD-10-CM | POA: Insufficient documentation

## 2013-03-19 ENCOUNTER — Other Ambulatory Visit: Payer: Self-pay | Admitting: *Deleted

## 2013-03-19 DIAGNOSIS — J90 Pleural effusion, not elsewhere classified: Secondary | ICD-10-CM

## 2013-03-24 ENCOUNTER — Ambulatory Visit (INDEPENDENT_AMBULATORY_CARE_PROVIDER_SITE_OTHER): Payer: Medicare Other | Admitting: Thoracic Surgery (Cardiothoracic Vascular Surgery)

## 2013-03-24 ENCOUNTER — Ambulatory Visit: Payer: Medicare Other | Admitting: Thoracic Surgery (Cardiothoracic Vascular Surgery)

## 2013-03-24 ENCOUNTER — Encounter: Payer: Self-pay | Admitting: Thoracic Surgery (Cardiothoracic Vascular Surgery)

## 2013-03-24 VITALS — BP 145/88 | HR 91 | Resp 18 | Ht 73.0 in | Wt 207.2 lb

## 2013-03-24 DIAGNOSIS — J90 Pleural effusion, not elsewhere classified: Secondary | ICD-10-CM

## 2013-03-24 DIAGNOSIS — Z9889 Other specified postprocedural states: Secondary | ICD-10-CM

## 2013-03-24 DIAGNOSIS — J9382 Other air leak: Secondary | ICD-10-CM

## 2013-03-24 DIAGNOSIS — IMO0002 Reserved for concepts with insufficient information to code with codable children: Secondary | ICD-10-CM

## 2013-03-24 NOTE — Progress Notes (Signed)
HPI:  [name] [name] returns today for followup. He saw Dr. Wynelle Cleveland over at [facility] this morning. He says that he recommended a pneumonectomy and flaps to deal with the pleural space. He needs to have a VQ scan and stress test. He also needs to stop smoking for 3 weeks prior to his surgery.  He says that he's been having a fair amount of pain still. Primarily related to his chest tube. He's been using one to 2 Percocets every 6 hours. He's not had any problems with his breathing, but he does feel tired all the time.  Past Medical History  Diagnosis Date  . Secondary hyperparathyroidism   . Hypertension   . Polysubstance abuse   . Anemia of chronic disease     BL Hgb ~10  . Tobacco abuse   . CHF (congestive heart failure)   . Pneumonia     hx of  . End-stage renal disease on hemodialysis     HD m/w/f - right arm graft  . History of blood transfusion      Current Outpatient Prescriptions  Medication Sig Dispense Refill  . calcium acetate (PHOSLO) 667 MG capsule Take 2,668 mg by mouth 3 (three) times daily with meals.       Marland Kitchen dextromethorphan (DELSYM) 30 MG/5ML liquid Take 10 mLs (60 mg total) by mouth 2 (two) times daily as needed for cough.    0  . dextrose 5 % SOLN 50 mL with ceFEPIme 2 G SOLR 2g in D5/50 ml IVPB run 30 minutes during dialysis every MWF  Completion Date is 04/09/2013      . guaiFENesin (MUCINEX) 600 MG 12 hr tablet Take 2 tablets (1,200 mg total) by mouth 2 (two) times daily.  60 tablet  0  . metoprolol tartrate (LOPRESSOR) 25 MG tablet Take 25 mg by mouth 2 (two) times daily.      . multivitamin (RENA-VIT) TABS tablet Take 1 tablet by mouth at bedtime.      Marland Kitchen oxyCODONE-acetaminophen (PERCOCET/ROXICET) 5-325 MG per tablet Take 1-2 tablets by mouth every 6 (six) hours as needed.  40 tablet  0  . warfarin (COUMADIN) 2.5 MG tablet Take 1 tablet (2.5 mg total) by mouth every Monday, Wednesday, and Friday at 6 PM.  30 tablet  1  . warfarin (COUMADIN) 5 MG tablet Take 1 tablet  (5 mg total) by mouth every Tuesday, Thursday, Saturday, and Sunday at 6 PM.  30 tablet  1   No current facility-administered medications for this visit.    Physical Exam BP 145/88  Pulse 91  Resp 18  Ht 6\' 1"  (1.854 m)  Wt 207 lb 3.7 oz (94 kg)  BMI 27.35 kg/m2  SpO56 22% 52 year old male in no acute distress Chest tube site okay, wound healing  Diagnostic Tests:   Impression: 52 year old gentleman with a persistent air leak after decortication for a fibrothorax. His right lung has now been collapsed requiring a long period of time. Dr. Wynelle Cleveland is recommended a pneumonectomy and then flaps to fill the pleural space. He's going to have a VQ and stress test prior to that procedure. I strongly emphasized to [name] [name] the importance of not smoking at all from now until the time of the surgery. As well as importance of not smoking after that for his long-term health.  He is almost off pain medication I gave him a prescription for Percocet 1-2 tablets every 6 hours as needed for pain, dispense 100 tablets, no refills  Plan: He  will see Dr. Wynelle Cleveland next week.  I'll plan to see him back the following week emeses having other testing or surgery done at that time. I'll continue to follow him until he goes to [facility] for surgery.

## 2013-04-07 ENCOUNTER — Ambulatory Visit (INDEPENDENT_AMBULATORY_CARE_PROVIDER_SITE_OTHER): Payer: Medicare Other | Admitting: Thoracic Surgery (Cardiothoracic Vascular Surgery)

## 2013-04-07 ENCOUNTER — Ambulatory Visit: Payer: Self-pay | Admitting: Thoracic Surgery (Cardiothoracic Vascular Surgery)

## 2013-04-07 ENCOUNTER — Encounter: Payer: Self-pay | Admitting: Thoracic Surgery (Cardiothoracic Vascular Surgery)

## 2013-04-07 ENCOUNTER — Ambulatory Visit
Admission: RE | Admit: 2013-04-07 | Discharge: 2013-04-07 | Disposition: A | Discharge status: Home / Self Care | Payer: Medicare Other | Source: Ambulatory Visit | Attending: Thoracic Surgery (Cardiothoracic Vascular Surgery) | Admitting: Thoracic Surgery (Cardiothoracic Vascular Surgery)

## 2013-04-07 VITALS — BP 162/90 | HR 72 | Resp 16 | Ht 73.0 in | Wt 205.0 lb

## 2013-04-07 DIAGNOSIS — J90 Pleural effusion, not elsewhere classified: Secondary | ICD-10-CM

## 2013-04-07 DIAGNOSIS — Z09 Encounter for follow-up examination after completed treatment for conditions other than malignant neoplasm: Secondary | ICD-10-CM

## 2013-04-07 DIAGNOSIS — IMO0002 Reserved for concepts with insufficient information to code with codable children: Secondary | ICD-10-CM

## 2013-04-07 DIAGNOSIS — J9382 Other air leak: Secondary | ICD-10-CM

## 2013-04-07 NOTE — Progress Notes (Signed)
HPI:  Mr. Guzman returns for a followup visit.  In the interim since his last visit he has noted a decrease in the amount of drainage from the chest tube. He's had some pain from local irritation at the chest tube site, but denies any other chest pain. He's not had any difficulties with his breathing. He denies any fevers or chills.  He tells me that Dr. Wynelle Cleveland is planning to remove the chest tube next week and then operate about 2 weeks later.  Past Medical History  Diagnosis Date  . Secondary hyperparathyroidism   . Hypertension   . Polysubstance abuse   . Anemia of chronic disease     BL Hgb ~10  . Tobacco abuse   . CHF (congestive heart failure)   . Pneumonia     hx of  . End-stage renal disease on hemodialysis     HD m/w/f - right arm graft  . History of blood transfusion       Current Outpatient Prescriptions  Medication Sig Dispense Refill  . calcium acetate (PHOSLO) 667 MG capsule Take 2,668 mg by mouth 3 (three) times daily with meals.       . cinacalcet (SENSIPAR) 90 MG tablet Take 90 mg by mouth daily.      Marland Kitchen dextromethorphan (DELSYM) 30 MG/5ML liquid Take 10 mLs (60 mg total) by mouth 2 (two) times daily as needed for cough.    0  . dextrose 5 % SOLN 50 mL with ceFEPIme 2 G SOLR 2g in D5/50 ml IVPB run 30 minutes during dialysis every MWF  Completion Date is 04/09/2013      . guaiFENesin (MUCINEX) 600 MG 12 hr tablet Take 2 tablets (1,200 mg total) by mouth 2 (two) times daily.  60 tablet  0  . metoprolol tartrate (LOPRESSOR) 25 MG tablet Take 25 mg by mouth 2 (two) times daily.      . multivitamin (RENA-VIT) TABS tablet Take 1 tablet by mouth at bedtime.      Marland Kitchen oxyCODONE-acetaminophen (PERCOCET/ROXICET) 5-325 MG per tablet Take 1-2 tablets by mouth every 6 (six) hours as needed.  40 tablet  0  . warfarin (COUMADIN) 2.5 MG tablet Take 1 tablet (2.5 mg total) by mouth every Monday, Wednesday, and Friday at 6 PM.  30 tablet  1  . warfarin (COUMADIN) 5 MG tablet Take 1  tablet (5 mg total) by mouth every Tuesday, Thursday, Saturday, and Sunday at 6 PM.  30 tablet  1   No current facility-administered medications for this visit.    Physical Exam BP 162/90  Pulse 72  Resp 16  Ht 6\' 1"  (1.854 m)  Wt 205 lb 0.4 oz (93 kg)  BMI 27.06 kg/m2  SpO84 14% 52 year old male in no acute distress Chest tube site with granulation tissue, tender chest tube site, no evidence of infection Left lung clear, absent breath sounds on right  Diagnostic Tests: Chest x-ray shows some fluid accumulation in the right pleural space. No reexpansion of the right long  Impression: Mr. Ramakrishnan is a 52 year old gentleman who has a trapped right lung. He is to have a pneumonectomy and flap procedure at [facility] to deal with the pleural space towards the end of May. His chest tube is draining smaller amounts and he does have some fluid within the chest. He is to see Dr. Wynelle Cleveland next week. We will leave everything as is for now and defer chest tube management to Dr. Wynelle Cleveland.  He has run out of [employer]  I gave him a prescription for 100 tablets of Percocet 5/325 , one to 2 tablets by mouth every 6 hours as needed for pain, no refills.  Plan: Return in 2 weeks with a PA and lateral chest x-ray.  He will followup with Dr. Wynelle Cleveland next week  He knows to call if he has any issues in the interim

## 2013-04-10 ENCOUNTER — Emergency Department: Payer: Self-pay | Admitting: Emergency Medicine

## 2013-04-10 ENCOUNTER — Ambulatory Visit: Payer: Self-pay | Admitting: Thoracic Surgery (Cardiothoracic Vascular Surgery)

## 2013-04-10 ENCOUNTER — Ambulatory Visit (INDEPENDENT_AMBULATORY_CARE_PROVIDER_SITE_OTHER): Payer: Self-pay | Admitting: Thoracic Surgery (Cardiothoracic Vascular Surgery)

## 2013-04-10 VITALS — BP 140/94 | HR 69 | Resp 20 | Ht 73.0 in | Wt 205.0 lb

## 2013-04-10 DIAGNOSIS — Z9889 Other specified postprocedural states: Secondary | ICD-10-CM

## 2013-04-10 DIAGNOSIS — IMO0002 Reserved for concepts with insufficient information to code with codable children: Secondary | ICD-10-CM

## 2013-04-10 DIAGNOSIS — J9382 Other air leak: Secondary | ICD-10-CM

## 2013-04-10 DIAGNOSIS — J90 Pleural effusion, not elsewhere classified: Secondary | ICD-10-CM

## 2013-04-10 LAB — CBC
MCH: 28.1 pg (ref 26.0–34.0)
MCHC: 31.5 g/dL — ABNORMAL LOW (ref 32.0–36.0)
WBC: 6.3 10*3/uL (ref 3.8–10.6)

## 2013-04-10 LAB — COMPREHENSIVE METABOLIC PANEL
Albumin: 2.3 g/dL — ABNORMAL LOW (ref 3.4–5.0)
Anion Gap: 7 (ref 7–16)
BUN: 50 mg/dL — ABNORMAL HIGH (ref 7–18)
Bilirubin,Total: 0.3 mg/dL (ref 0.2–1.0)
Chloride: 102 mmol/L (ref 98–107)
Creatinine: 11.94 mg/dL — ABNORMAL HIGH (ref 0.60–1.30)
EGFR (African American): 5 — ABNORMAL LOW
EGFR (Non-African Amer.): 4 — ABNORMAL LOW
Glucose: 105 mg/dL — ABNORMAL HIGH (ref 65–99)
Osmolality: 291 (ref 275–301)
Potassium: 3.8 mmol/L (ref 3.5–5.1)
SGOT(AST): 13 U/L — ABNORMAL LOW (ref 15–37)
Sodium: 139 mmol/L (ref 136–145)
Total Protein: 7.7 g/dL (ref 6.4–8.2)

## 2013-04-10 LAB — PROTIME-INR: INR: 1.3

## 2013-04-10 NOTE — Progress Notes (Signed)
Patient ID: [name] [name], male   DOB: 12/28/1960, 52 y.o.   MRN: [id number] [name] [name]'s chest tube fell out this morning.  He went to the emergency department at [facility]. He saw Dr. Jimmye Norman there who called [place]. I asked him to come to the office to so we can check on things.  He says that he was just getting ready to go to dialysis this morning and when he stood up to the tube fell out to the ground. That was several hours ago. He has not had any shortness of breath. It is not having fevers or chills.  When I saw him in the office earlier this week the chest tube was essentially clogging really wasn't doing much. I considered taking it out then but left in place because Dr. Wynelle Cleveland overtube was planning to take it out later. Given that the plan was to take the tube out I don't see any reason to reinsert the tube at this time. His lung his been completely collapsed for weeks now and is not in any danger from a respiratory standpoint. The biggest concern is a risk of infection.  I instructed [name] [name] to keep the wound covered with a dry dressing.  He knows to call if he develops fevers or chills, increasing pain or shortness of breath or any other symptoms that he is concerned about.

## 2013-04-21 ENCOUNTER — Ambulatory Visit: Payer: Medicare Other | Admitting: Thoracic Surgery (Cardiothoracic Vascular Surgery)

## 2013-04-22 DIAGNOSIS — J860 Pyothorax with fistula: Secondary | ICD-10-CM | POA: Insufficient documentation

## 2013-04-23 DIAGNOSIS — E874 Mixed disorder of acid-base balance: Secondary | ICD-10-CM | POA: Insufficient documentation

## 2013-04-23 DIAGNOSIS — Z95828 Presence of other vascular implants and grafts: Secondary | ICD-10-CM | POA: Insufficient documentation

## 2013-04-23 DIAGNOSIS — Z9889 Other specified postprocedural states: Secondary | ICD-10-CM | POA: Insufficient documentation

## 2013-04-23 DIAGNOSIS — G8912 Acute post-thoracotomy pain: Secondary | ICD-10-CM | POA: Insufficient documentation

## 2013-04-23 DIAGNOSIS — R578 Other shock: Secondary | ICD-10-CM | POA: Insufficient documentation

## 2013-06-22 ENCOUNTER — Emergency Department: Payer: Self-pay | Admitting: Unknown Physician Specialty

## 2013-06-22 LAB — COMPREHENSIVE METABOLIC PANEL
Albumin: 2.7 g/dL — ABNORMAL LOW (ref 3.4–5.0)
BUN: 30 mg/dL — ABNORMAL HIGH (ref 7–18)
Creatinine: 7.57 mg/dL — ABNORMAL HIGH (ref 0.60–1.30)
EGFR (Non-African Amer.): 8 — ABNORMAL LOW
Potassium: 3.8 mmol/L (ref 3.5–5.1)
SGPT (ALT): 13 U/L (ref 12–78)
Sodium: 139 mmol/L (ref 136–145)

## 2013-06-22 LAB — CBC
HCT: 29.6 % — ABNORMAL LOW (ref 40.0–52.0)
HGB: 9.3 g/dL — ABNORMAL LOW (ref 13.0–18.0)
RDW: 19.3 % — ABNORMAL HIGH (ref 11.5–14.5)

## 2013-06-22 LAB — TROPONIN I: Troponin-I: <0.02 ng/mL

## 2013-07-03 ENCOUNTER — Ambulatory Visit: Payer: Self-pay | Admitting: Nephrology

## 2013-07-07 DIAGNOSIS — R0602 Shortness of breath: Secondary | ICD-10-CM | POA: Insufficient documentation

## 2013-07-20 ENCOUNTER — Other Ambulatory Visit: Payer: Self-pay | Admitting: *Deleted

## 2013-07-20 DIAGNOSIS — J90 Pleural effusion, not elsewhere classified: Secondary | ICD-10-CM

## 2013-07-21 ENCOUNTER — Ambulatory Visit
Admission: RE | Admit: 2013-07-21 | Discharge: 2013-07-21 | Disposition: A | Discharge status: Home / Self Care | Payer: Medicare Other | Source: Ambulatory Visit | Attending: Thoracic Surgery (Cardiothoracic Vascular Surgery) | Admitting: Thoracic Surgery (Cardiothoracic Vascular Surgery)

## 2013-07-21 ENCOUNTER — Other Ambulatory Visit: Payer: Self-pay

## 2013-07-21 ENCOUNTER — Encounter: Payer: Self-pay | Admitting: Thoracic Surgery (Cardiothoracic Vascular Surgery)

## 2013-07-21 ENCOUNTER — Ambulatory Visit (INDEPENDENT_AMBULATORY_CARE_PROVIDER_SITE_OTHER): Payer: Medicare Other | Admitting: Thoracic Surgery (Cardiothoracic Vascular Surgery)

## 2013-07-21 VITALS — BP 177/98 | HR 82 | Resp 16 | Ht 73.0 in | Wt 200.0 lb

## 2013-07-21 DIAGNOSIS — J90 Pleural effusion, not elsewhere classified: Secondary | ICD-10-CM

## 2013-07-21 DIAGNOSIS — Z09 Encounter for follow-up examination after completed treatment for conditions other than malignant neoplasm: Secondary | ICD-10-CM

## 2013-07-21 MED ORDER — AMLODIPINE BESYLATE 10 MG PO TABS: 10.0000 mg | ORAL_TABLET | Freq: Every day | ORAL | Status: DC

## 2013-07-21 MED ORDER — PREDNISONE (PAK) 10 MG PO TABS: ORAL_TABLET | ORAL | Status: DC

## 2013-07-21 NOTE — Progress Notes (Signed)
HPI:  Mr. Studley presents back today with a complaint of shortness of breath.  I had taken care of him earlier in the year for a recurrent right pleural effusion. He ultimately did a VATS decortication on him. This was complicated by bronchopleural fistula. We tried bronchial valves without success. I reoperated on him, but he continued to have a persistent leak. Finally referred him to Dr. Wynelle Cleveland at [facility] who reoperated on him in May or June. He was able to get the air leak stopped and chest tube removed.  He was feeling short of breath in July and had a chest x-ray, which showed postoperative changes on the right in a new left pleural effusion. He had a thoracentesis by Dr. Wynelle Cleveland. According to the notes 1.8 L of serous fluid was withdrawn. His post thoracentesis chest x-ray showed resolution of the effusion with persistent atelectasis. Mr. Rosa says that he felt great the next day. Over the next few days he began getting more and more short of breath again. He was seen by cardiology. An echocardiogram showed an EF of 45%. There was no pericardial effusion or evidence for constrictive pericarditis.  Mr. Betschart says that he has remained persistently short of breath since a few days after the thoracentesis and now presents for further evaluation.  He denies any fevers chills or sweats. He does have a cough. He says that he doesn't have much energy.  Past Medical History  Diagnosis Date  . Secondary hyperparathyroidism   . Hypertension   . Polysubstance abuse   . Anemia of chronic disease     BL Hgb ~10  . Tobacco abuse   . CHF (congestive heart failure)   . Pneumonia     hx of  . End-stage renal disease on hemodialysis     HD m/w/f - right arm graft  . History of blood transfusion       Current Outpatient Prescriptions  Medication Sig Dispense Refill  . calcium acetate (PHOSLO) 667 MG capsule Take 2,668 mg by mouth 3 (three) times daily with meals.       . cinacalcet  (SENSIPAR) 90 MG tablet Take 90 mg by mouth daily.      . metoprolol succinate (TOPROL-XL) 50 MG 24 hr tablet Take 50 mg by mouth daily. Take with or immediately following a meal.      . zolpidem (AMBIEN CR) 6.25 MG CR tablet Take 6.25 mg by mouth at bedtime as needed for sleep.      Marland Kitchen amLODipine (NORVASC) 10 MG tablet Take 1 tablet (10 mg total) by mouth daily.  60 tablet  2  . predniSONE (STERAPRED UNI-PAK) 10 MG tablet Take 5 tablets by mouth on day 1, 4 tablets on day 2, 3 tablets on day 3, 2 tablets on day 4, and 1 tablet on day 5  15 tablet  0   No current facility-administered medications for this visit.    Physical Exam BP 177/98  Pulse 82  Resp 16  Ht 6\' 1"  (1.854 m)  Wt 200 lb (90.719 kg)  BMI 26.39 kg/m2  SpO2 94% Anxious 52 year old male in no acute distress Lungs diminished breath sounds both bases Cardiac regular rate and rhythm normal S1 and S2  Diagnostic Tests: Chest x-ray 07/21/13 *RADIOLOGY REPORT*  Clinical Data: Pleural effusion, shortness of breath, chest pain  CHEST - 2 VIEW  Comparison: 04/07/2013  Findings: There is interval improvement in right lung aeration.  There is a residual right hydropneumothorax with a  pleural air  fluid level in the right apical region. Right chest tube no longer  present. Trachea is midline. Interval development of a left  pleural effusion with lingula and left base compressive  atelectasis. Left upper lobe clear. Stable heart size and  vascularity.  IMPRESSION:  Interval improvement in right lung aeration with a residual right  hydropneumothorax.  Right chest tube removed  Developing small left effusion with lingula and left lower lobe  compressive atelectasis  Original Report Authenticated By: Jerilynn Mages. Annamaria Boots, M.D.  Impression: 52 year old gentleman with multiple medical issues including end-stage renal disease requiring hemodialysis. He developed a new left-sided pleural effusion in mid July. Thoracentesis removed 1.8 L of  fluid. However his symptoms recurred within a few days after thoracentesis. Cardiac workup did not reveal a source. His chest x-ray today shows a moderate right pleural effusion. It is smaller than it was on 06/22/2013, it is large enough to be of concern. Certainly we want to try to take care of this effusion before he develops a trapped lung and needs a decortication.  It is difficult to tell where the effusion is by physical exam. I think it would be safest and in his best interest to have this drained with ultrasound guidance. I am also going to treat him with prednisone taper in case there is some inflammatory component. All the implants seemed back in a few weeks to make sure that the effusion was not recurring. It does recur we may need to consider a CT scan.  Plan: Ultrasound-guided thoracentesis in radiology.  Prednisone taper from 50-10 mg over 5 days.  Return in 3 weeks with repeat PA and lateral chest x-ray.

## 2013-07-22 ENCOUNTER — Telehealth: Payer: Self-pay

## 2013-07-22 NOTE — Telephone Encounter (Signed)
Wife called to cx thoracentesis scheduled for 8/14.  Stated [facility] would [facility] and CXR would follow on 8/15. Informed [facility] of all details.

## 2013-07-23 ENCOUNTER — Ambulatory Visit (HOSPITAL_COMMUNITY): Payer: Medicare Other

## 2013-07-29 ENCOUNTER — Other Ambulatory Visit: Payer: Self-pay | Admitting: *Deleted

## 2013-07-29 DIAGNOSIS — J90 Pleural effusion, not elsewhere classified: Secondary | ICD-10-CM

## 2013-07-30 ENCOUNTER — Ambulatory Visit (HOSPITAL_COMMUNITY)
Admission: RE | Admit: 2013-07-30 | Discharge: 2013-07-30 | Disposition: A | Discharge status: Home / Self Care | Payer: Medicare Other | Source: Ambulatory Visit | Attending: Radiology | Admitting: Radiology

## 2013-07-30 ENCOUNTER — Ambulatory Visit (HOSPITAL_COMMUNITY)
Admission: RE | Admit: 2013-07-30 | Discharge: 2013-07-30 | Disposition: A | Discharge status: Home / Self Care | Payer: Medicare Other | Source: Ambulatory Visit | Attending: Thoracic Surgery (Cardiothoracic Vascular Surgery) | Admitting: Thoracic Surgery (Cardiothoracic Vascular Surgery)

## 2013-07-30 DIAGNOSIS — R0989 Other specified symptoms and signs involving the circulatory and respiratory systems: Secondary | ICD-10-CM | POA: Insufficient documentation

## 2013-07-30 DIAGNOSIS — R0609 Other forms of dyspnea: Secondary | ICD-10-CM | POA: Insufficient documentation

## 2013-07-30 DIAGNOSIS — J90 Pleural effusion, not elsewhere classified: Secondary | ICD-10-CM | POA: Insufficient documentation

## 2013-07-30 LAB — GLUCOSE, SEROUS FLUID: Glucose, Fluid: 130 mg/dL

## 2013-07-30 LAB — BODY FLUID CELL COUNT WITH DIFFERENTIAL
Eos, Fluid: 5 %
Monocyte-Macrophage-Serous Fluid: 50 % (ref 50–90)
Neutrophil Count, Fluid: 6 % (ref 0–25)

## 2013-07-30 LAB — LACTATE DEHYDROGENASE, PLEURAL OR PERITONEAL FLUID: LD, Fluid: 84 U/L — ABNORMAL HIGH (ref 3–23)

## 2013-07-30 NOTE — Procedures (Signed)
Successful US guided left thoracentesis. Yielded 976mL of clear yellow fluid. Pt tolerated procedure well. No immediate complications.  Specimen was sent for labs. CXR ordered.  Ascencion Dike PA-C 07/30/2013 11:53 AM

## 2013-07-31 ENCOUNTER — Other Ambulatory Visit: Payer: Self-pay | Admitting: *Deleted

## 2013-07-31 DIAGNOSIS — J90 Pleural effusion, not elsewhere classified: Secondary | ICD-10-CM

## 2013-07-31 LAB — PH, BODY FLUID: pH, Fluid: 8

## 2013-08-02 LAB — BODY FLUID CULTURE

## 2013-08-04 ENCOUNTER — Encounter: Payer: Self-pay | Admitting: Thoracic Surgery (Cardiothoracic Vascular Surgery)

## 2013-08-04 ENCOUNTER — Ambulatory Visit (INDEPENDENT_AMBULATORY_CARE_PROVIDER_SITE_OTHER): Payer: Medicare Other | Admitting: Thoracic Surgery (Cardiothoracic Vascular Surgery)

## 2013-08-04 ENCOUNTER — Ambulatory Visit
Admission: RE | Admit: 2013-08-04 | Discharge: 2013-08-04 | Disposition: A | Discharge status: Home / Self Care | Payer: Medicare Other | Source: Ambulatory Visit | Attending: Thoracic Surgery (Cardiothoracic Vascular Surgery) | Admitting: Thoracic Surgery (Cardiothoracic Vascular Surgery)

## 2013-08-04 VITALS — BP 148/88 | HR 78 | Resp 20 | Ht 73.0 in | Wt 213.0 lb

## 2013-08-04 DIAGNOSIS — J90 Pleural effusion, not elsewhere classified: Secondary | ICD-10-CM

## 2013-08-04 DIAGNOSIS — Z09 Encounter for follow-up examination after completed treatment for conditions other than malignant neoplasm: Secondary | ICD-10-CM

## 2013-08-04 MED ORDER — PREDNISONE (PAK) 10 MG PO TABS: ORAL_TABLET | ORAL | Status: DC

## 2013-08-04 NOTE — Progress Notes (Signed)
  HPI:  [name] returns today for followup of his left pleural effusion. He had a thoracentesis on 8/21. 900 mL of fluid was withdrawn and the post drainage chest x-ray showed a good result. His breathing improved significantly after drainage.  He says that he's been doing well with dialysis and they been able to get fluid off without any problems.   Past Medical History  Diagnosis Date  . Secondary hyperparathyroidism   . Hypertension   . Polysubstance abuse   . Anemia of chronic disease     BL Hgb ~10  . Tobacco abuse   . CHF (congestive heart failure)   . Pneumonia     hx of  . End-stage renal disease on hemodialysis     HD m/w/f - right arm graft  . History of blood transfusion      Current Outpatient Prescriptions  Medication Sig Dispense Refill  . amLODipine (NORVASC) 10 MG tablet Take 1 tablet (10 mg total) by mouth daily.  60 tablet  2  . calcium acetate (PHOSLO) 667 MG capsule Take 2,668 mg by mouth 3 (three) times daily with meals.       . cinacalcet (SENSIPAR) 90 MG tablet Take 90 mg by mouth daily.      . metoprolol succinate (TOPROL-XL) 50 MG 24 hr tablet Take 50 mg by mouth daily. Take with or immediately following a meal.      . zolpidem (AMBIEN CR) 6.25 MG CR tablet Take 6.25 mg by mouth at bedtime as needed for sleep.      . predniSONE (STERAPRED UNI-PAK) 10 MG tablet 5 tablets PO day 1, 4 tablets PO day 2, 3 tablets PO day 3, 2 tablets PO day 4, 1 tablet PO day 5  15 tablet  0   No current facility-administered medications for this visit.    Physical Exam BP 148/88  Pulse 78  Resp 20  Ht 6\' 1"  (1.854 m)  Wt 213 lb (96.616 kg)  BMI 28.11 kg/m2  SpO2 40% 52 year old male in no acute distress Diminished breath sounds at right apex and left base, lungs otherwise clear  Diagnostic Tests: Chest x-ray 08/04/2013 EXAM:  CHEST 2 VIEW  COMPARISON: 07/30/2013.  FINDINGS:  There are moderate bilateral pleural effusions. Right pleural  effusion or pleural  thickening is similar to the post thoracentesis  chest x-ray. Enlarging left pleural effusion. No pneumothorax.  Bibasilar airspace opacities. Right apical density with  hydropneumothorax again noted, unchanged. Cardiomegaly. No acute  bony abnormality.  IMPRESSION:  Reaccumulation of the left pleural effusion, now moderate. Stable  findings on the right.  Cardiomegaly.  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 He presented with a recurrent left pleural effusion and we did a thoracentesis on him last week. The fluid was inflammatory. He had a steroid taper which he actually finished prior to doing the thoracentesis. His followup chest x-ray today shows that the fluid is partially reaccumulated. His breathing does remain improved.  I don't think he needs another thoracentesis at this point, but given that the fluid was inflammatory I think would be reasonable to give him another round of steroids.  Plan: Prednisone taper  Return in 2 weeks with repeat PA and lateral chest x-ray to followup effusion.

## 2013-08-17 ENCOUNTER — Other Ambulatory Visit: Payer: Self-pay | Admitting: *Deleted

## 2013-08-17 DIAGNOSIS — J90 Pleural effusion, not elsewhere classified: Secondary | ICD-10-CM

## 2013-08-18 ENCOUNTER — Encounter: Payer: Self-pay | Admitting: Thoracic Surgery (Cardiothoracic Vascular Surgery)

## 2013-08-18 ENCOUNTER — Ambulatory Visit (INDEPENDENT_AMBULATORY_CARE_PROVIDER_SITE_OTHER): Payer: Medicare Other | Admitting: Thoracic Surgery (Cardiothoracic Vascular Surgery)

## 2013-08-18 ENCOUNTER — Ambulatory Visit
Admission: RE | Admit: 2013-08-18 | Discharge: 2013-08-18 | Disposition: A | Discharge status: Home / Self Care | Payer: Medicare Other | Source: Ambulatory Visit | Attending: Thoracic Surgery (Cardiothoracic Vascular Surgery) | Admitting: Thoracic Surgery (Cardiothoracic Vascular Surgery)

## 2013-08-18 VITALS — BP 149/90 | HR 74 | Resp 20 | Ht 73.0 in | Wt 213.0 lb

## 2013-08-18 DIAGNOSIS — J90 Pleural effusion, not elsewhere classified: Secondary | ICD-10-CM

## 2013-08-18 NOTE — Progress Notes (Signed)
HPI:  [name] [name] turns today for a scheduled followup visit regarding his left pleural effusion.   I had taken care of him earlier in the year for a recurrent right pleural effusion. We did a right VATS decortication on him. This was complicated by bronchopleural fistula. We tried bronchial valves without success. I reoperated on him, but he continued to have a persistent leak. Finally, I referred him to Dr. Wynelle Cleveland at [facility] who reoperated on him. He was able to get the air leak stopped and chest tube removed. His lung was about 75% reexpanded.  He was feeling short of breath in July and had a chest x-ray, which showed postoperative changes on the right and a new left pleural effusion. He had a thoracentesis by Dr. Wynelle Cleveland. According to the notes 1.8 L of serous fluid was withdrawn. His post thoracentesis chest x-ray showed resolution of the effusion with persistent atelectasis. [name] [name] says that he felt great the next day. Over the next few days he began getting more and more short of breath again. He was seen by cardiology. An echocardiogram showed an EF of 45%. There was no pericardial effusion or evidence for constrictive pericarditis. He had an ultrasound-guided thoracentesis done on August 21. 900 mL of fluid was evacuated. He again had symptomatic relief. I saw him back on August 26 and there was some evidence of pleural effusion reaccumulating. We treated him with a prednisone taper and he returns today for followup.  He says that since his last visit his breathing is been stable. He's not had any fevers or chills. He occasionally has a sharp pain from his right thoracotomy. He has not had any pleuritic pain on the left side. He says his been tolerated dialysis well and he has been letting them takeoff maximum fluid.  Past Medical History  Diagnosis Date  . Secondary hyperparathyroidism   . Hypertension   . Polysubstance abuse   . Anemia of chronic disease     BL Hgb ~10  .  Tobacco abuse   . CHF (congestive heart failure)   . Pneumonia     hx of  . End-stage renal disease on hemodialysis     HD m/w/f - right arm graft  . History of blood transfusion       Current Outpatient Prescriptions  Medication Sig Dispense Refill  . amLODipine (NORVASC) 10 MG tablet Take 1 tablet (10 mg total) by mouth daily.  60 tablet  2  . calcium acetate (PHOSLO) 667 MG capsule Take 2,668 mg by mouth 3 (three) times daily with meals.       . cinacalcet (SENSIPAR) 90 MG tablet Take 90 mg by mouth daily.      . metoprolol succinate (TOPROL-XL) 50 MG 24 hr tablet Take 50 mg by mouth daily. Take with or immediately following a meal.      . zolpidem (AMBIEN CR) 6.25 MG CR tablet Take 6.25 mg by mouth at bedtime as needed for sleep.       No current facility-administered medications for this visit.    Physical Exam BP 149/90  Pulse 74  Resp 20  Ht 6\' 1"  (1.854 m)  Wt 213 lb (96.616 kg)  BMI 28.11 kg/m2  SpO61 86% 52 year old male in no acute distress Diminished breath sounds on the right side and the left base  Diagnostic Tests: Chest x-ray 08/18/2013 *RADIOLOGY REPORT*  Clinical Data: Right pleural effusion, smoker  CHEST - 2 VIEW  Comparison: 08/04/2013  Findings: Chronic  right hydropneumothorax. Similar appearance  compared to 08/04/2013. Stable small left effusion as well.  Basilar densities noted compatible with atelectasis versus  scarring. Heart is enlarged. Central vascular congestion evident.  No interval change.  IMPRESSION:  Stable right hydropneumothorax. Stable small left effusion.  Original Report Authenticated By: Jerilynn Mages. Annamaria Boots, M.D.        Impression: 52 year old gentleman with a left pleural effusion. He previously had a trapped right lung requiring thoracoscopic decortication and multiple subsequent procedures for a bronchopleural fistula.  The left effusion was inflammatory. He was treated with thoracentesis on 2 separate occasions and then we gave  him a prednisone taper after the second thoracentesis. Symptomatically he has been stable and his chest x-ray is improved from his last visit 2 weeks ago. No further intervention indicated at this time.  Plan: I will be happy to see [name] [name] back any time if I can be of any further assistance with his care.

## 2013-08-25 ENCOUNTER — Institutional Professional Consult (permissible substitution): Payer: Medicare Other | Admitting: Pulmonary Disease

## 2013-12-29 IMAGING — CR DG CHEST 1V PORT
1 series · 1 of 1 positions shown · non-contrast
Comparison: none

REASON FOR EXAM: Chest Pain
COMMENTS:

PROCEDURE:     DXR - DXR PORTABLE CHEST SINGLE VIEW  - August 22, 2012  [DATE]
RESULT:     Comparison: 07/27/2011 and CT of the chest 06/20/2011

[ap]
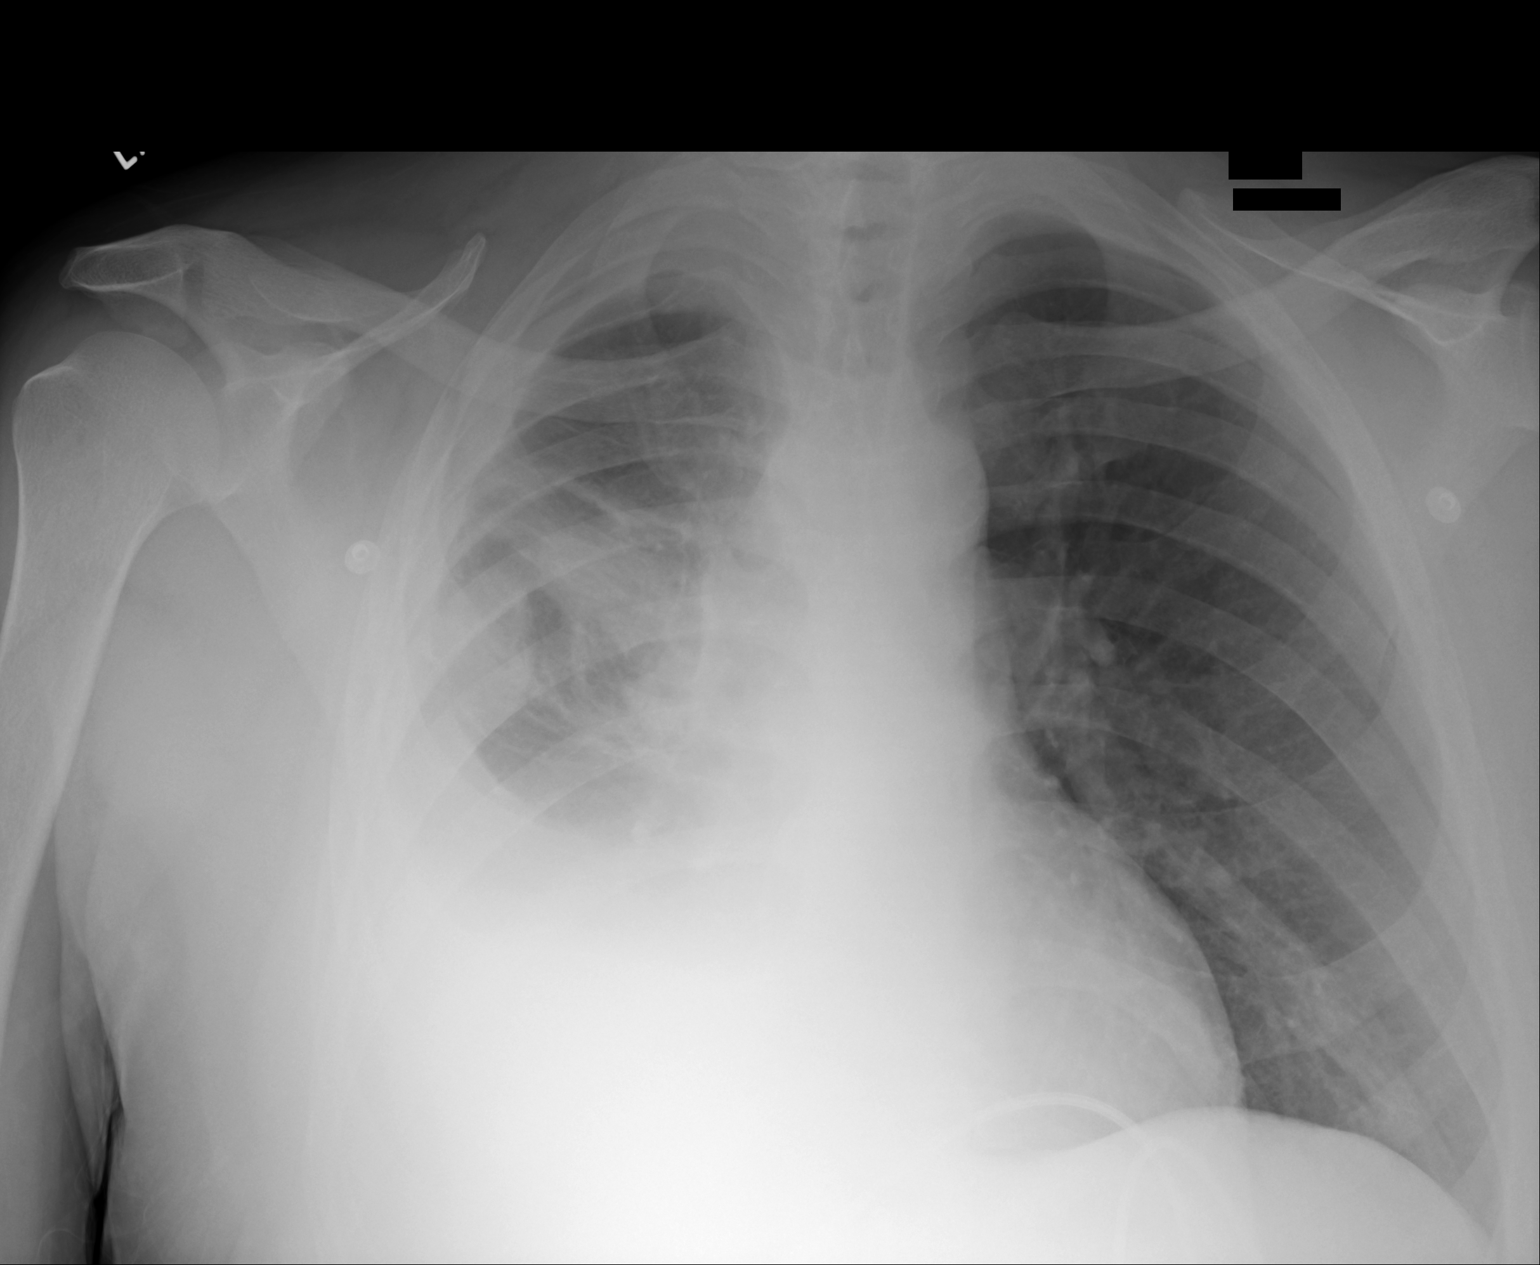

[1 of 1 positions shown; findings below may reference images not displayed]

FINDINGS: The heart and mediastinum are stable. There is a moderate sized right
pleural effusion which is slightly decreased in size at the lung base.
However, there are new rounded heterogeneous opacities in the right midlung.
These could related to pleural fluid within the major fissure or
atelectasis. Additionally, there is pleural fluid or thickening in the right
upper hemithorax which is increased from prior. The left lung is clear.
There are heterogeneous opacities in the right lung base.
IMPRESSION: Moderate size right pleural effusion has slightly increased in size of the
right lung base. However, there is increase in pleural fluid or thickening
in the right upper lung. There may be some degree of loculation.
Additionally, there are new heterogeneous round opacities in the mid lung.
These could be related to pleural fluid in the major fissure or atelectasis.
Underlying parenchymal abnormality is not excluded. Followup PA and lateral
chest radiograph are recommended.

[REDACTED]

## 2014-03-31 ENCOUNTER — Other Ambulatory Visit: Payer: Self-pay | Admitting: Thoracic Surgery (Cardiothoracic Vascular Surgery)

## 2014-04-01 ENCOUNTER — Ambulatory Visit (INDEPENDENT_AMBULATORY_CARE_PROVIDER_SITE_OTHER): Payer: Medicare Other | Admitting: Physician Assistant

## 2014-04-01 ENCOUNTER — Encounter: Payer: Self-pay | Admitting: Physician Assistant

## 2014-04-01 VITALS — BP 134/94 | HR 88 | Ht 72.0 in | Wt 245.0 lb

## 2014-04-01 DIAGNOSIS — R1900 Intra-abdominal and pelvic swelling, mass and lump, unspecified site: Secondary | ICD-10-CM

## 2014-04-01 DIAGNOSIS — N186 End stage renal disease: Secondary | ICD-10-CM

## 2014-04-01 DIAGNOSIS — R198 Other specified symptoms and signs involving the digestive system and abdomen: Secondary | ICD-10-CM

## 2014-04-01 NOTE — Patient Instructions (Addendum)
You have been given a separate informational sheet regarding your tobacco use, the importance of quitting and local resources to help you quit.  You have been scheduled for a CT scan of the abdomen and pelvis at [facility] ([address]---this is in the same building as Press photographer).   You are scheduled on Tuesday 4-28 -2015 at 1:00 PM . You should arrive at 12:45 PM  prior to your appointment time for registration. Please follow the written instructions below on the day of your exam:  WARNING: IF YOU ARE ALLERGIC TO IODINE/X-RAY DYE, PLEASE NOTIFY RADIOLOGY IMMEDIATELY AT [phone]! YOU WILL BE GIVEN A 13 HOUR PREMEDICATION PREP.  1) Do not eat or drink anything after 9:00 am  (4 hours prior to your test) 2) You have been given 2 bottles of oral contrast to drink. The solution may taste  better if refrigerated, but do NOT add ice or any other liquid to this solution. Shake  well before drinking.    Drink 1 bottle of contrast @ 11:00 am  (2 hours prior to your exam)  Drink 1 bottle of contrast @ 12:00 PM  (1 hour prior to your exam)  You may take any medications as prescribed with a small amount of water except for the following: Metformin, Glucophage, Glucovance, Avandamet, Riomet, Fortamet, Actoplus Met, Janumet, Glumetza or Metaglip. The above medications must be held the day of the exam AND 48 hours after the exam.  The purpose of you drinking the oral contrast is to aid in the visualization of your intestinal tract. The contrast solution may cause some diarrhea. Before your exam is started, you will be given a small amount of fluid to drink. Depending on your individual set of symptoms, you may also receive an intravenous injection of x-ray contrast/dye. Plan on being at [facility] for 30 minutes or long, depending on the type of exam you are having performed.  If you have any questions regarding your exam or if you need to reschedule, you may call the CT  department at [phone] between the hours of 8:00 am and 5:00 pm, Monday-Friday.  ________________________________________________________________________

## 2014-04-01 NOTE — Progress Notes (Signed)
i agree with the above plan

## 2014-04-01 NOTE — Progress Notes (Signed)
Subjective:    Patient ID: [name] [name], male    DOB: April 01, 1961, 53 y.o.   MRN: [id number]  HPI   [name] [name] is a somewhat complicated 52 year old African American male referred today by Dr Milton Ferguson At [facility] in [place]. He is known to Dr. Ardis Hughs from a colonoscopy done in January of 2014 [name] [name] was hospitalized with rectal bleeding. He was found to have multiple left colon diverticuli and one small polyp was also removed. Biopsies returned showing a tubular adenoma. It was felt that he had had a diverticular bleed at that time.  Patient has history of end-stage renal disease and is on dialysis, has history of congestive heart failure, anemia of chronic disease, polysubstance abuse, secondary hyperparathyroidism, hypertension, and had a complicated course about a year ago with a recurrent right pleural effusion which required a VATS decortication which was then complicated by a bronchopleural fistula. He had a second operation in [place] continued to have a persistent leak and then finally was referred to [facility] and had yet a third surgery and finally able to get the air stopped. He has been followed here by Dr. Roxan Hockey and was last seen in September 2014. It was felt that his effusion was inflammatory. He had also had problems previously with a left pleural effusion.  Patient comes here today because of question of ascites. Patient is complaining of increasing abdominal girth over the past 3-4 months. He also feels that he's been gaining weight in his abdomen and that his dry weight has been going up at dialysis. He says he feels full and swollen in his abdomen he and is also concerned about a bulge on the right side of his abdomen which has been present since his lung surgery. He says his appetite is fine he has no complaints of heartburn indigestion nausea vomiting, no changes in his bowel habits melena or hematochezia.  He has not had any  recent abdominal imaging. He has no history of known liver disease. Prior echo done in February 2014 showed an EF of 60-65%. Patient had CT of the abdomen done in March of 2014 with no contrast which  did not show any abnormality of the liver and no free intraperitoneal fluid.   Review of Systems  Constitutional: Negative.   HENT: Negative.   Eyes: Negative.   Respiratory: Negative.   Cardiovascular: Negative.   Gastrointestinal: Positive for abdominal distention.  Endocrine: Negative.   Genitourinary: Negative.   Musculoskeletal: Negative.   Skin: Negative.   Allergic/Immunologic: Negative.   Neurological: Negative.   Hematological: Negative.   Psychiatric/Behavioral: Negative.    Outpatient Prescriptions Prior to Visit  Medication Sig Dispense Refill  . amLODipine (NORVASC) 10 MG tablet Take 1 tablet (10 mg total) by mouth daily.  60 tablet  2  . calcium acetate (PHOSLO) 667 MG capsule Take 2,668 mg by mouth 3 (three) times daily with meals.       . cinacalcet (SENSIPAR) 90 MG tablet Take 90 mg by mouth daily.      . metoprolol succinate (TOPROL-XL) 50 MG 24 hr tablet Take 50 mg by mouth daily. Take with or immediately following a meal.      . zolpidem (AMBIEN CR) 6.25 MG CR tablet Take 6.25 mg by mouth at bedtime as needed for sleep.       No facility-administered medications prior to visit.   Allergies  Allergen Reactions  . Tape Other (See Comments)    Plastic Tape  Causes blister   Patient Active Problem List   Diagnosis Date Noted  . S/P thoracotomy 03/04/2013  . 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 Bacteremia 01/06/2013  . Benign neoplasm of colon 12/16/2012  . Diverticulosis of colon (without mention of hemorrhage) 12/16/2012  . ESRD (end stage renal disease) 12/12/2012  . H/O: hypertension 12/12/2012  . Pleural effusion, right 12/12/2012  . Hyperkalemia 12/11/2012  . Acute blood loss anemia 12/11/2012  . Sinus tachycardia, resolved with PRBC  resuscitation 12/11/2012  . Lower GI bleed 12/11/2012  . End-stage renal disease on hemodialysis   . Secondary hyperparathyroidism   . Hypertension   . Polysubstance abuse   . Anemia of chronic disease   . Tobacco abuse    History  Substance Use Topics  . Smoking status: Current Every Day Smoker -- 0.50 packs/day for 20 years    Types: Cigarettes  . Smokeless tobacco: Never Used  . Alcohol Use: No     Comment: occasional   family history includes Diabetes in his maternal grandmother; Hypertension in his maternal aunt and maternal uncle; Renal Disease in his maternal grandmother; Thyroid disease in his mother.     Objective:   Physical Exam  WD AA male   In NAD  BP 134/94 ,pulse 88,height  6 foot weight 245 . HEENT; nontraumatic normocephalic EOMI PERRLA sclera anicteric, Supple ;no JVD, Cardiovascular; regular rate and rhythm with S1-S2 no murmur or gallop, Pulmonary ;somewhat decreased breath sounds on the right, there is a scar  right lower anterior chest, Abdomen; protuberant but soft ,no focal tenderness, no definite fluid wave he has a ventral hernia superior to the umbilicus which is protruding, nontender but unable to reduce, he also has a large incisional scar in the right abdomen from prior renal surgery, bowel sounds are present, Rectal; not done, Extremities ;no clubbing cyanosis or edema skin warm and dry, Psych; mood and affect appropriate         Assessment & Plan:  #1  53 yo -[place] American male with complaints of increased abdominal girth of unclear etiology will rule out ascites. #2 end-stage renal disease on dialysis #3 history of congestive heart failure #4 history of anemia of chronic disease   #5 history polysubstance abuse  #6 history of recurrent pleural effusions status post right  VATS decortication due to bronchopleural fistula 2014  #7 diverticulosis with previous history of diverticular bleed #8 history of adenomatous colon polyp last colonoscopy  January 2014 should have five-year interval followup #9 hypertension #10 ventral hernia  Plan; Labs were reviewed from his referring physician, most recent hemoglobin 10.6, hematocrit 35.1 serum iron 47 ferritin 1101 creatinine was 12.9 platelets 1:15 SGOT 12 SGPT 8.  Schedule for CT scan of the abdomen and pelvis no IV contrast. Further plans pending results of CT

## 2014-04-06 ENCOUNTER — Ambulatory Visit (INDEPENDENT_AMBULATORY_CARE_PROVIDER_SITE_OTHER)
Admission: RE | Admit: 2014-04-06 | Discharge: 2014-04-06 | Disposition: A | Discharge status: Home / Self Care | Payer: Medicare Other | Source: Ambulatory Visit | Attending: Physician Assistant | Admitting: Physician Assistant

## 2014-04-06 DIAGNOSIS — R198 Other specified symptoms and signs involving the digestive system and abdomen: Secondary | ICD-10-CM

## 2014-04-06 DIAGNOSIS — N186 End stage renal disease: Secondary | ICD-10-CM

## 2014-04-06 DIAGNOSIS — R1900 Intra-abdominal and pelvic swelling, mass and lump, unspecified site: Secondary | ICD-10-CM

## 2014-04-20 IMAGING — CR DG CHEST 2V
2 series · 2 of 2 positions shown · non-contrast
Comparison: None.

CLINICAL DATA: Chronic right effusion and shortness of breath

CHEST - 2 VIEW

[w chest pa]
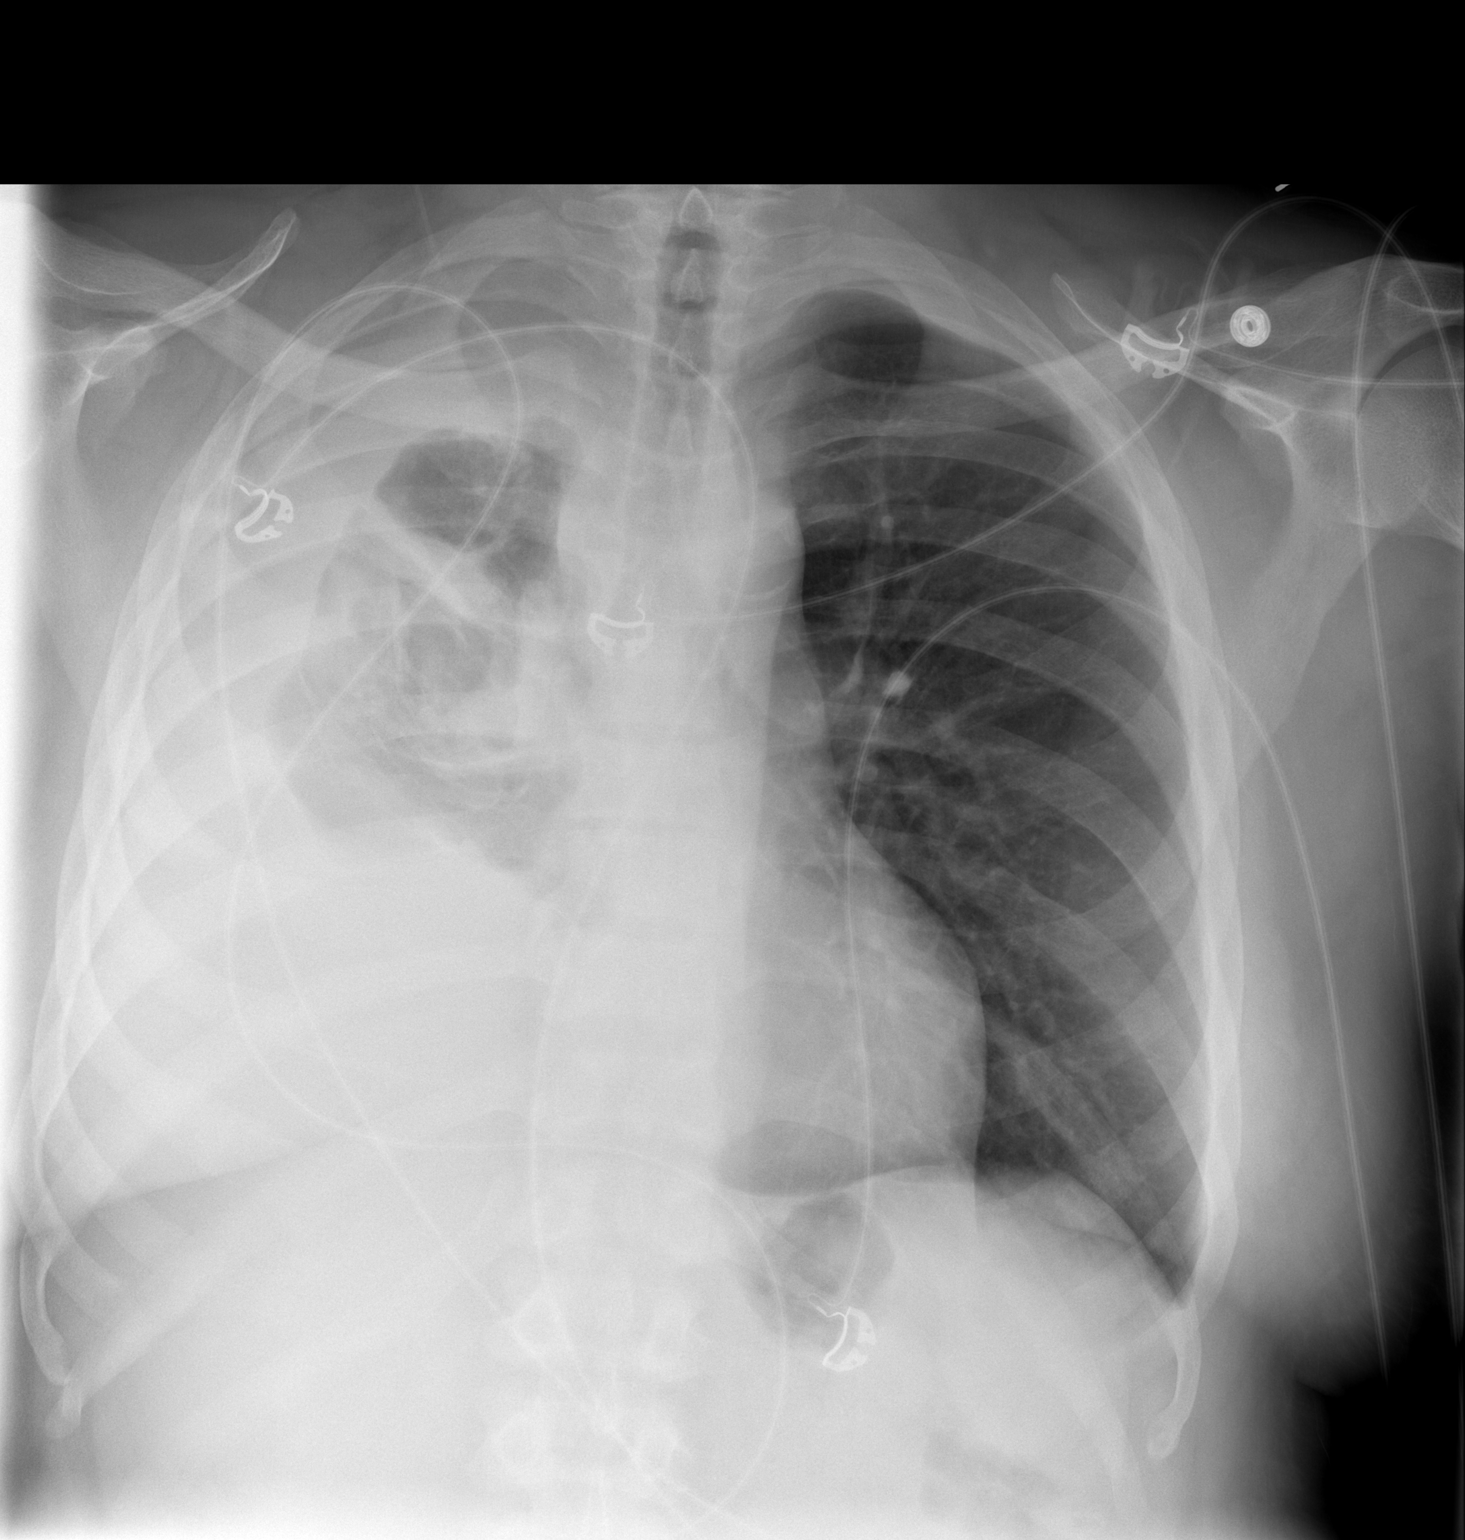

[w chest lat]
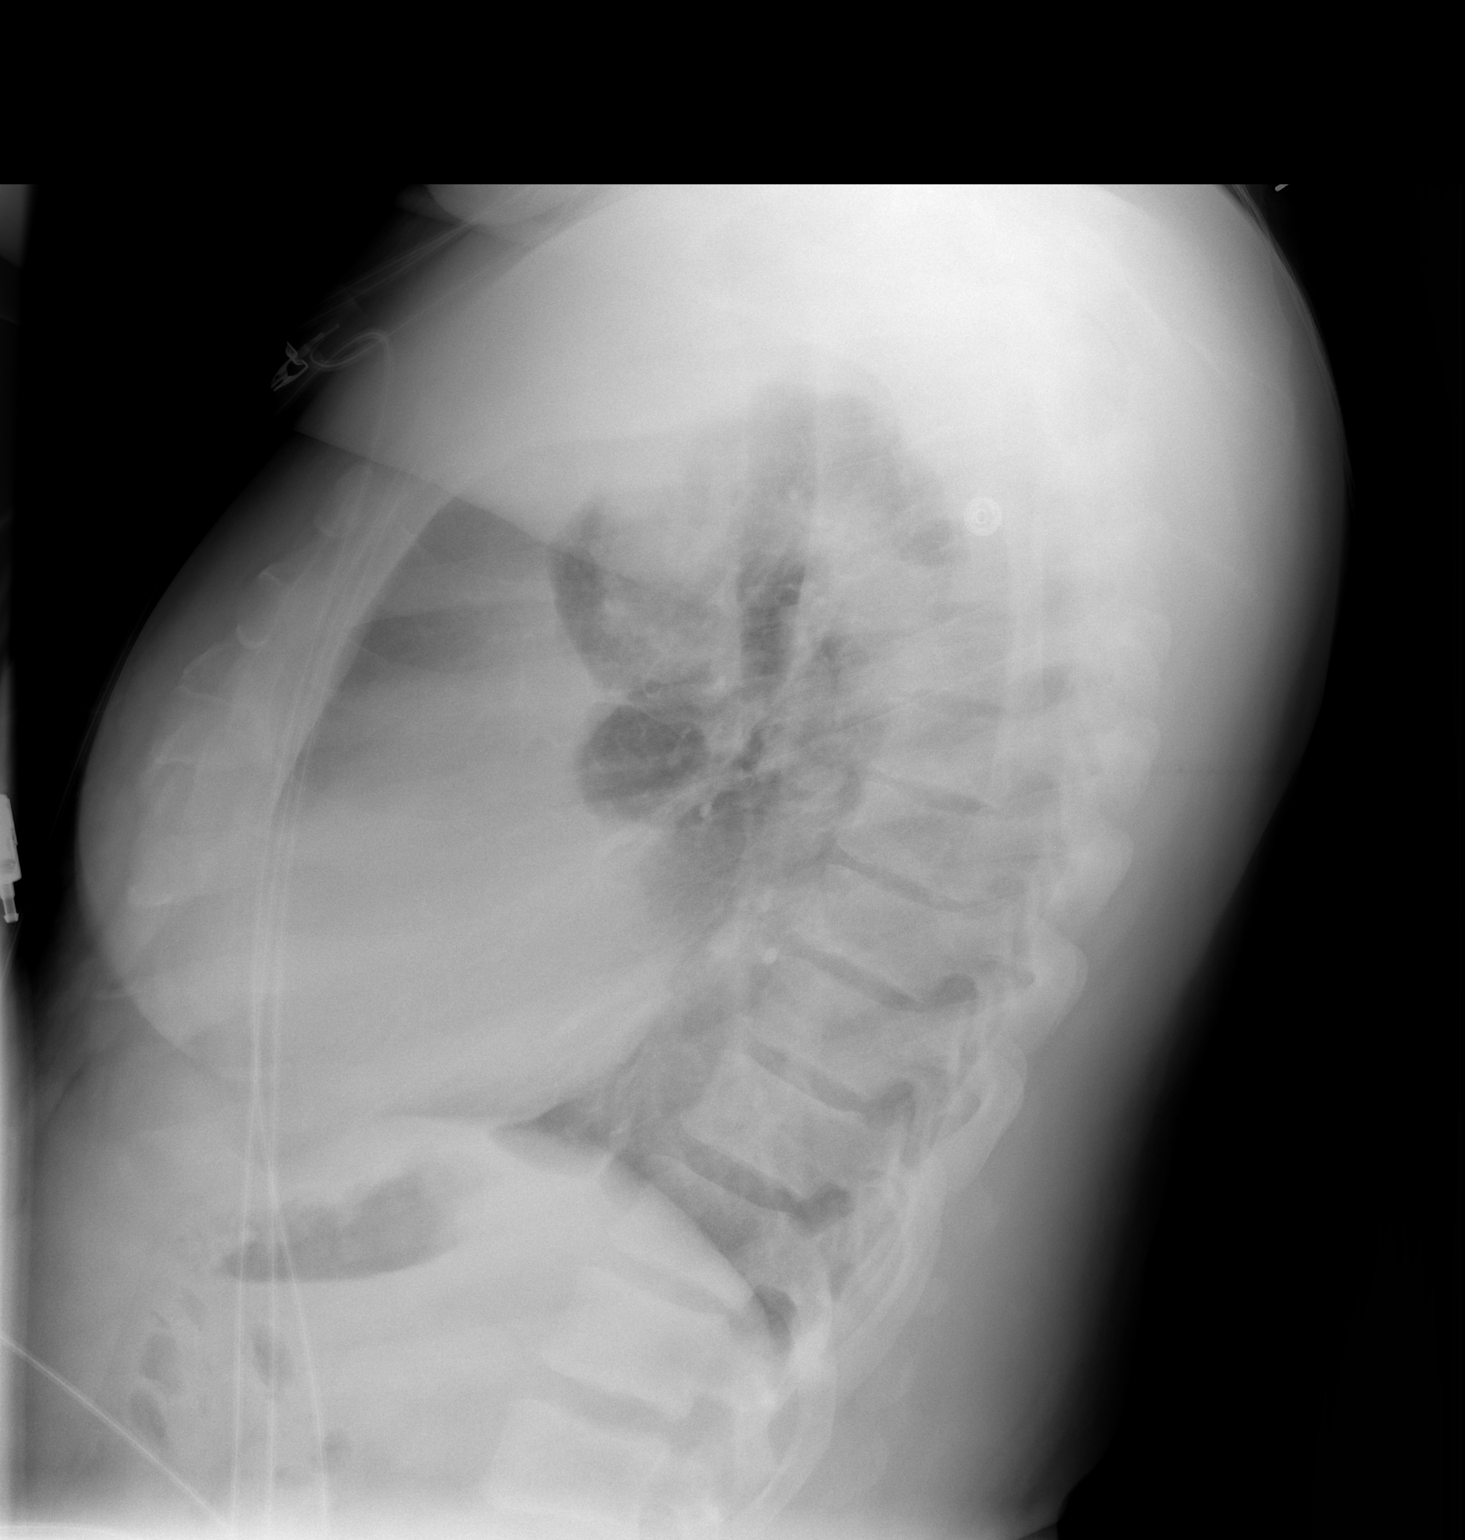

[2 of 2 positions shown; findings below may reference images not displayed]

FINDINGS: The large right-sided pleural effusion is noted.  Only
mild aeration of the right lung is seen. Mild infiltrative density
is noted in the aerated portion of the right lung.  The left lung
is clear.  The cardiac shadow is within normal limits.  No acute
bony abnormality is noted.
IMPRESSION: Large right-sided effusion which by history is chronic.  Only mild
aeration of the right lung is noted.  There is some infiltrative
density within the right lung.

## 2014-04-20 IMAGING — CT CT CHEST W/O CM
3 of 4 series · 16 of 30 positions shown, 18 images · non-contrast
Comparison: Portable chest obtained today.

CLINICAL DATA: Status post thoracentesis.  Residual pleural fluid.
End-stage renal disease.  On dialysis.  Anemia of chronic disease.
Polysubstance abuse.

CT CHEST WITHOUT CONTRAST
TECHNIQUE: Multidetector CT imaging of the chest was performed
following the standard protocol without IV contrast.

[Series 2: routine chest · axial · 0.82mm/px · z∈[-281,-86]mm · 5 of 65 slices shown, 7 images]
[im 13/65  mediastinal]
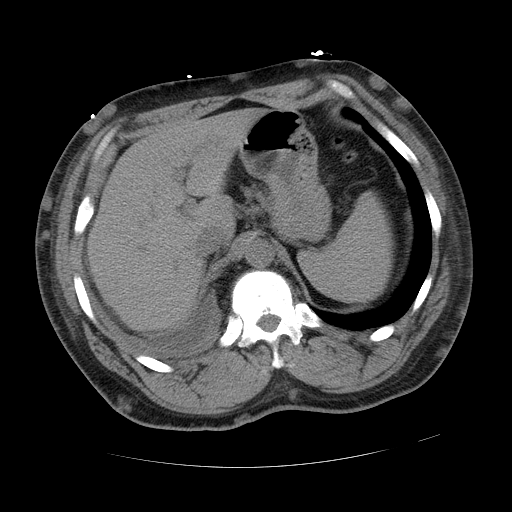
[im 13/65  lung]
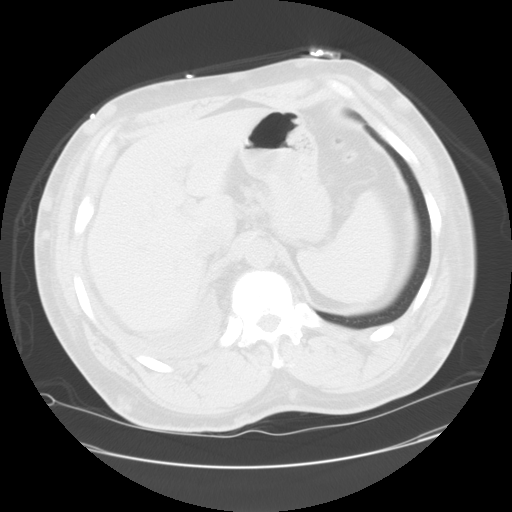
[im 26/65  lung]
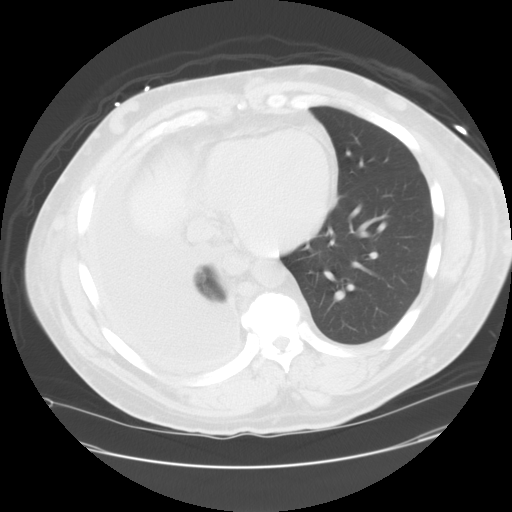
[im 36/65  lung]
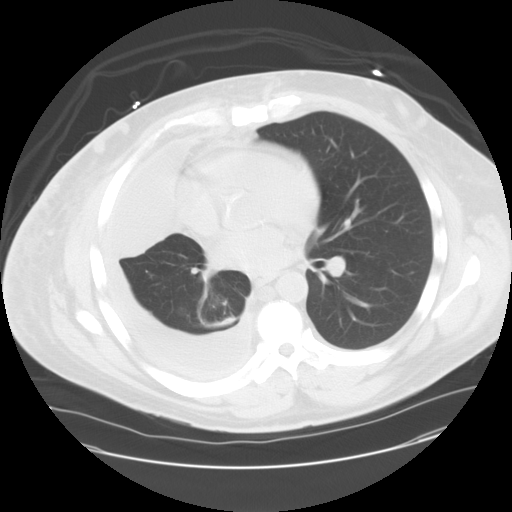
[im 39/65  lung]
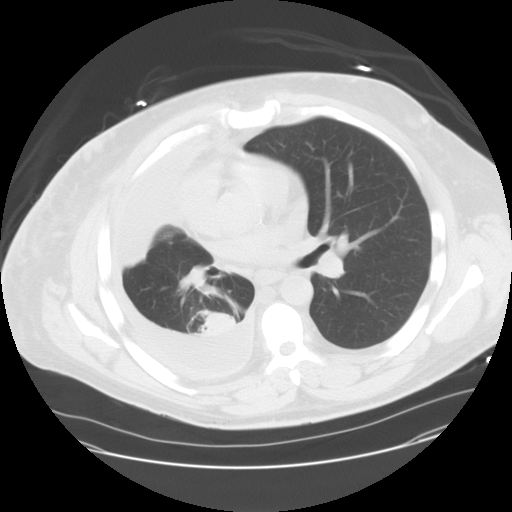
[im 52/65  mediastinal]
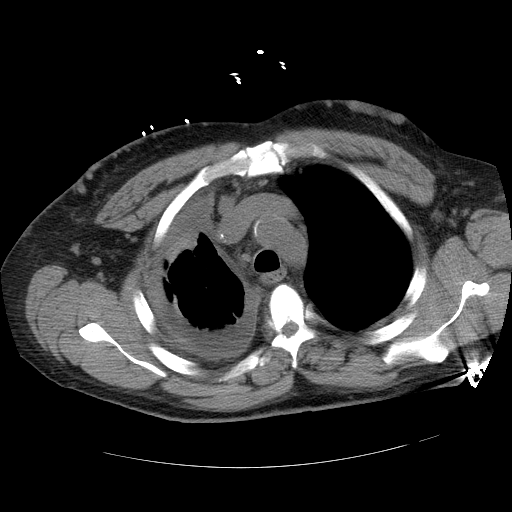
[im 52/65  lung]
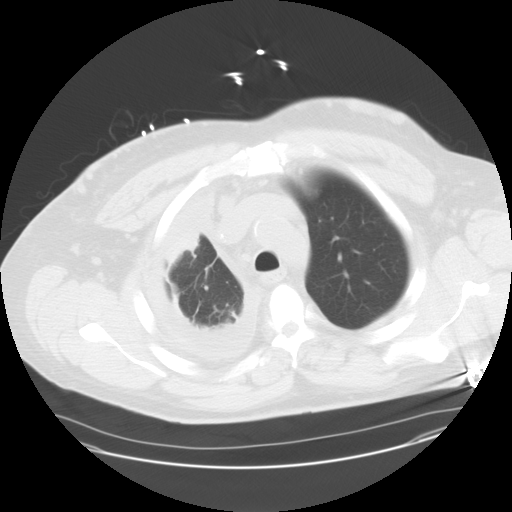

[Series 3: recon 2: routine chest · axial · 0.82mm/px · z∈[-201,-111]mm · 3 of 55 slices shown]
[im 19/55  lung]
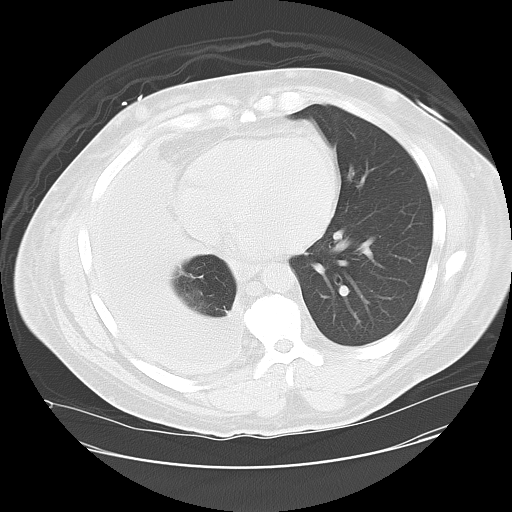
[im 26/55  lung]
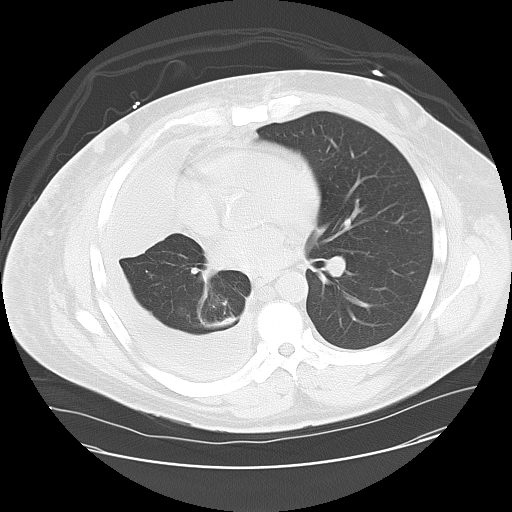
[im 37/55  lung]
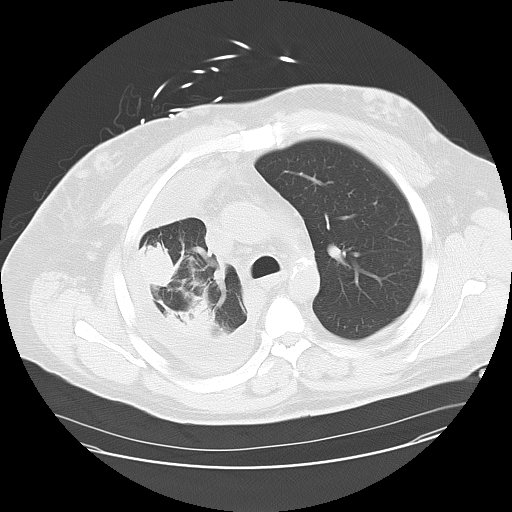

[Series 401: sag · sagittal · 0.82mm/px · 8 of 125 slices shown]
[im 14/125  lung]
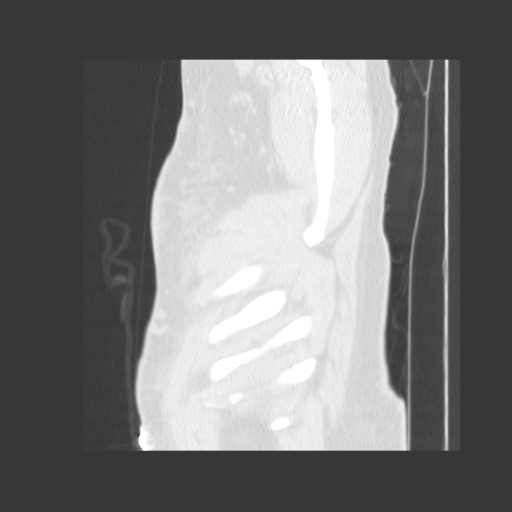
[im 28/125  lung]
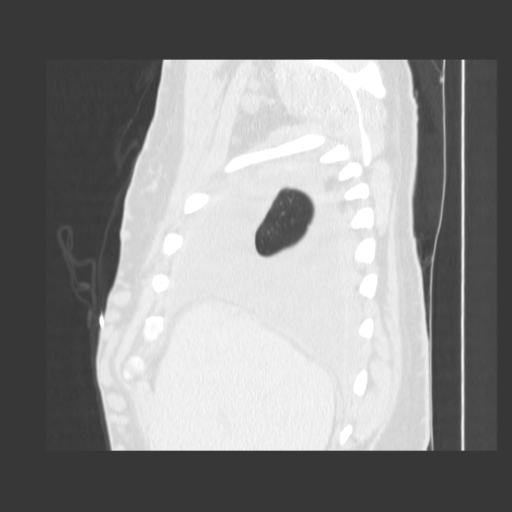
[im 42/125  lung]
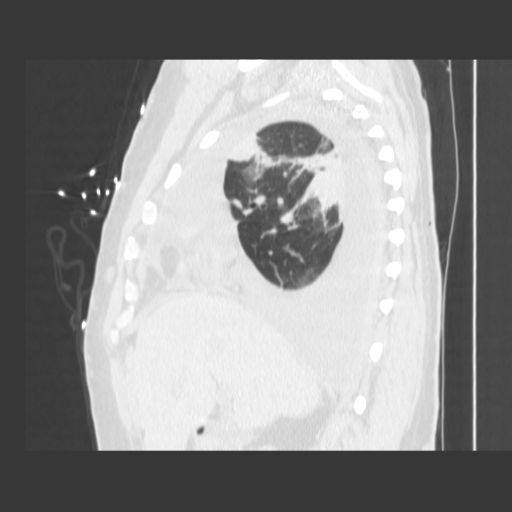
[im 56/125  lung]
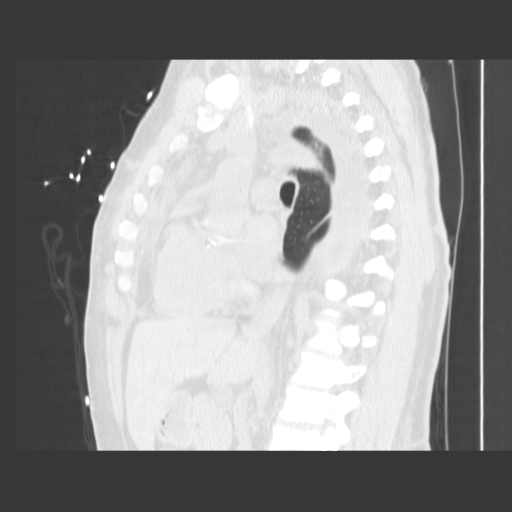
[im 69/125  lung]
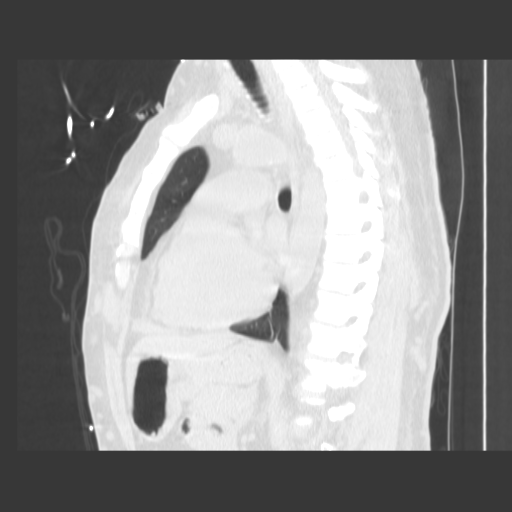
[im 83/125  lung]
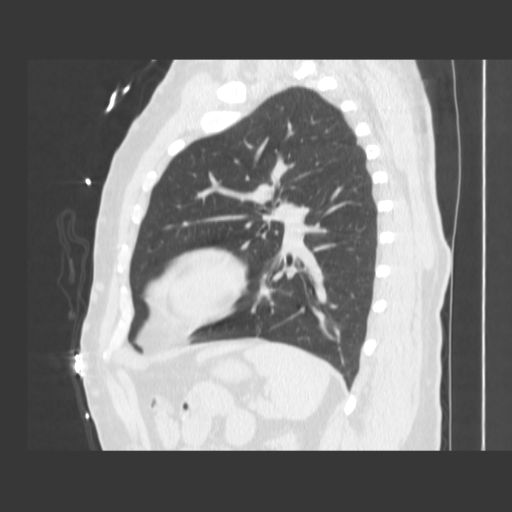
[im 97/125  lung]
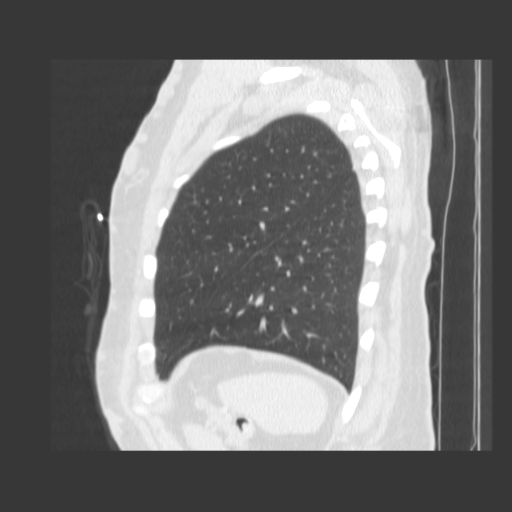
[im 111/125  lung]
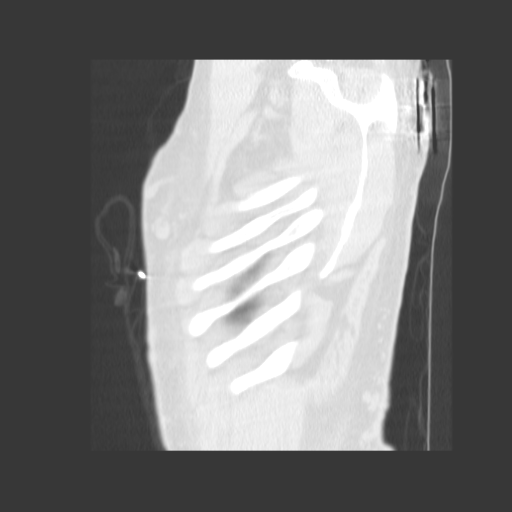

[16 of 30 positions shown; findings below may reference images not displayed]

FINDINGS: Extensive bilateral subcutaneous varices.  Moderate-sized
right pleural effusion with a peripheral higher density component
separated from the remainder of the pleural fluid by a thin
membrane.  The peripheral component measures 1 cm in maximum
thickness.  No pleural fluid on the left.  There are rounded mass-
like densities in the right upper lobe with an appearance
compatible with rounded atelectasis.  No true lung masses and no
enlarged lymph nodes are seen.  The included portions of the
kidneys are small with multiple small cysts on the left.  The bones
are diffusely sclerotic.
IMPRESSION: 1.  Moderate-sized right pleural effusion with a peripheral
slightly higher density component separated by a thin membrane.
2.  Areas of rounded atelectasis in the right upper lobe.
3.  Extensive bilateral subcutaneous varices.
4.  Diffuse bone sclerosis.  This can be the result of secondary
hyperparathyroidism related to the patient's chronic renal disease.

## 2014-04-20 IMAGING — CR DG CHEST 1V PORT
1 series · 1 of 1 positions shown · non-contrast
Comparison: 12/12/2012, earlier the same day

CLINICAL DATA: Status post thoracentesis.

PORTABLE CHEST - 1 VIEW

[AP]
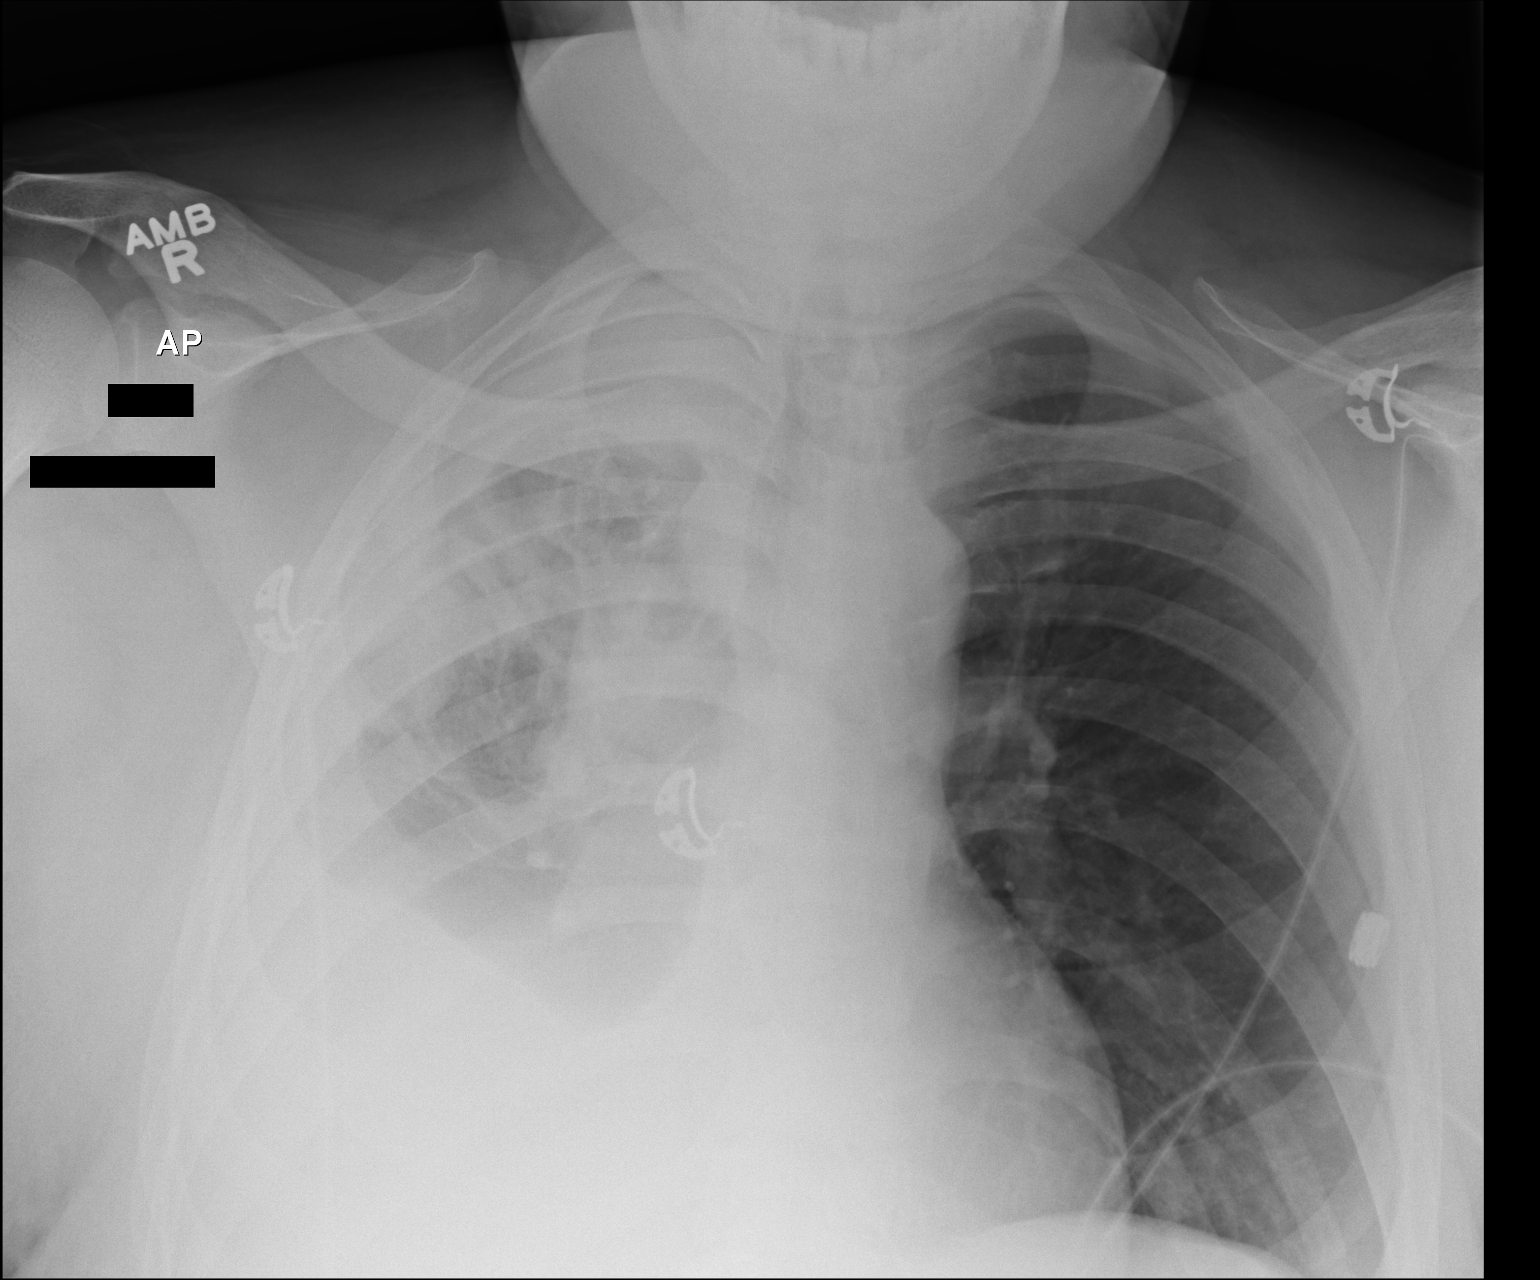

[1 of 1 positions shown; findings below may reference images not displayed]

FINDINGS: Right pleural effusion has decreased in the interval with
persistent right circumferential pleural thickening/fluid.  No
evidence for pneumothorax.  Left lung is clear. The
cardiopericardial silhouette is within normal limits for size.
IMPRESSION: Decreased right pleural effusion without evidence for pneumothorax.

## 2014-04-21 IMAGING — CR DG CHEST 2V
2 series · 2 of 2 positions shown · non-contrast
Comparison: 12/12/2012

CLINICAL DATA: Pleuritic chest pain post thoracentesis

CHEST - 2 VIEW

[w chest pa]
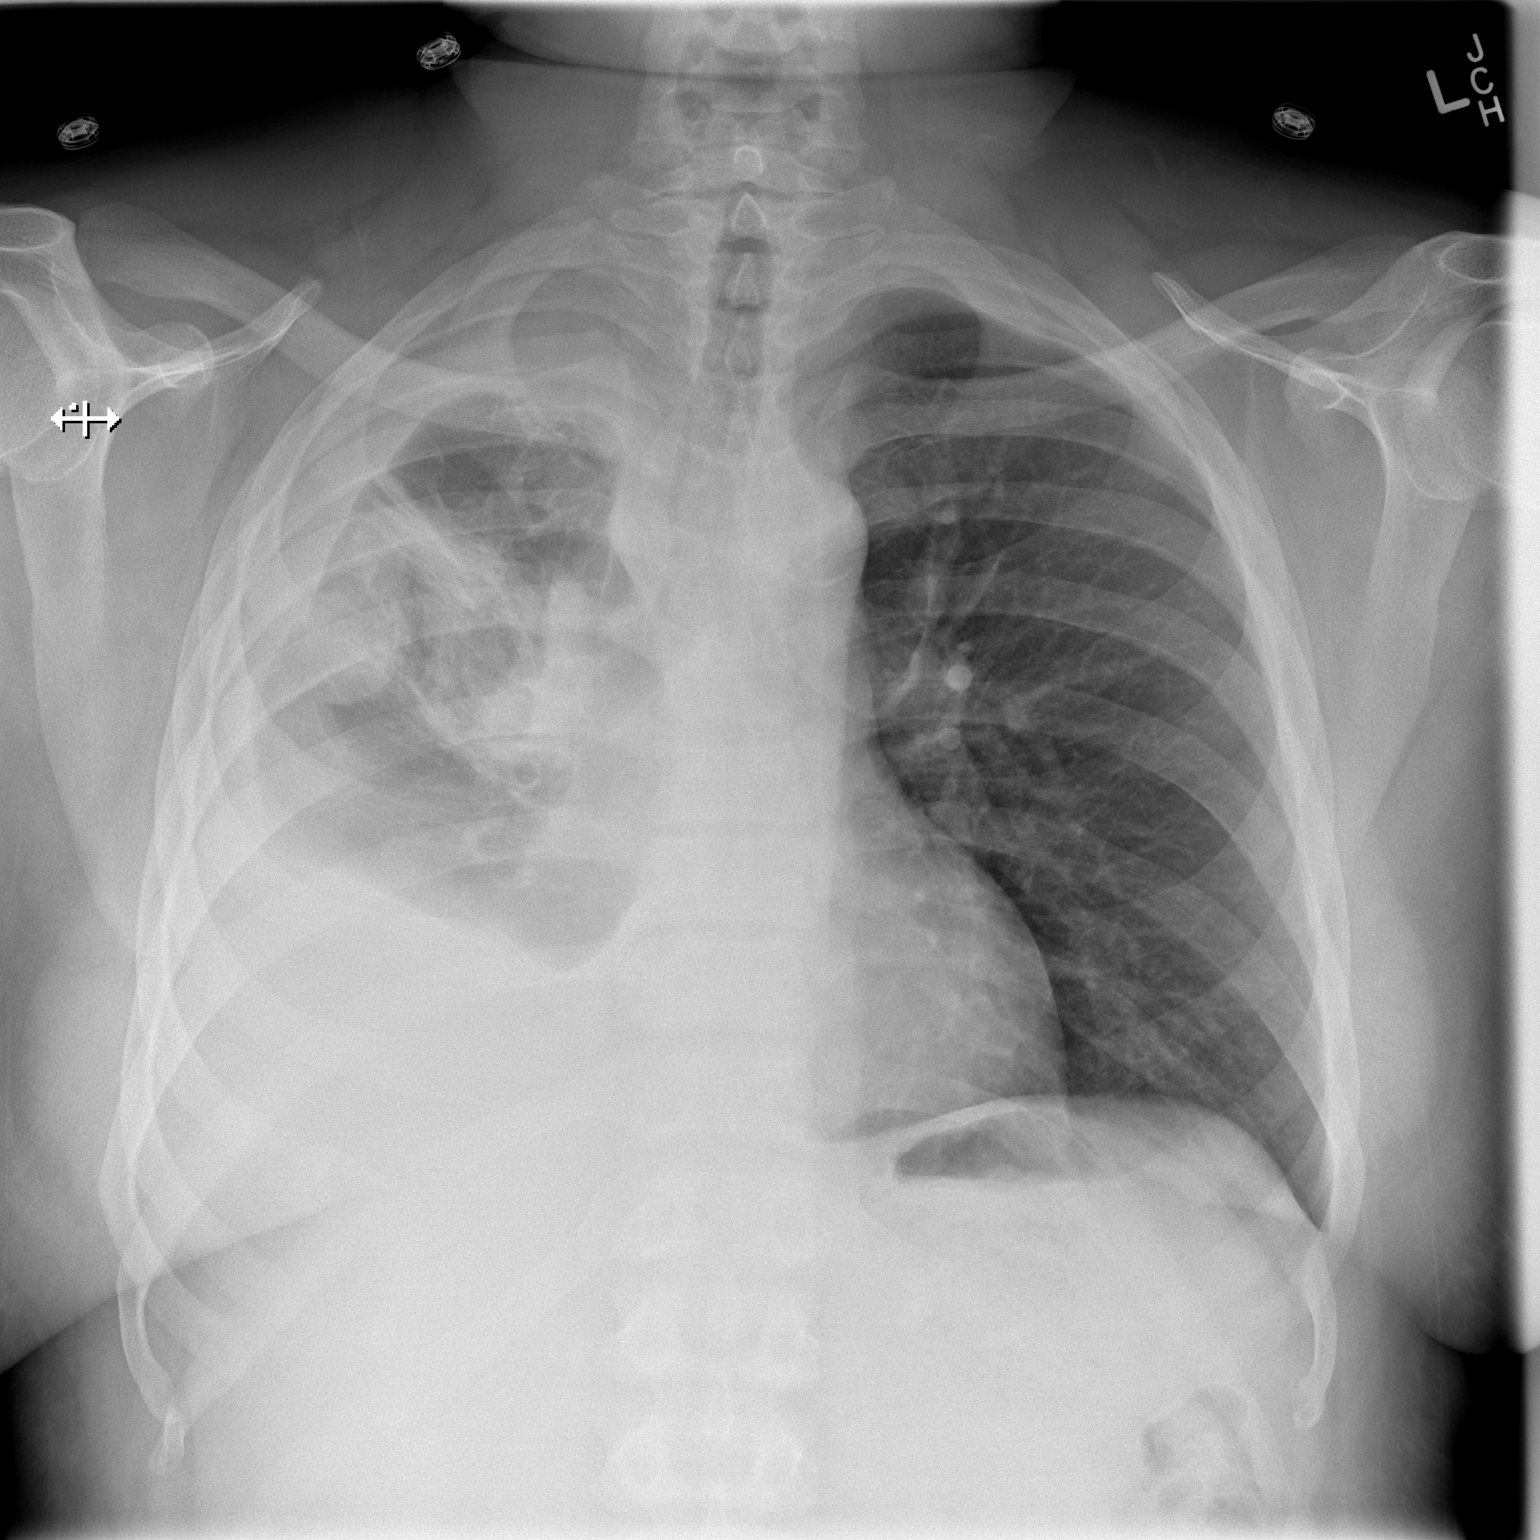

[w chest lat]
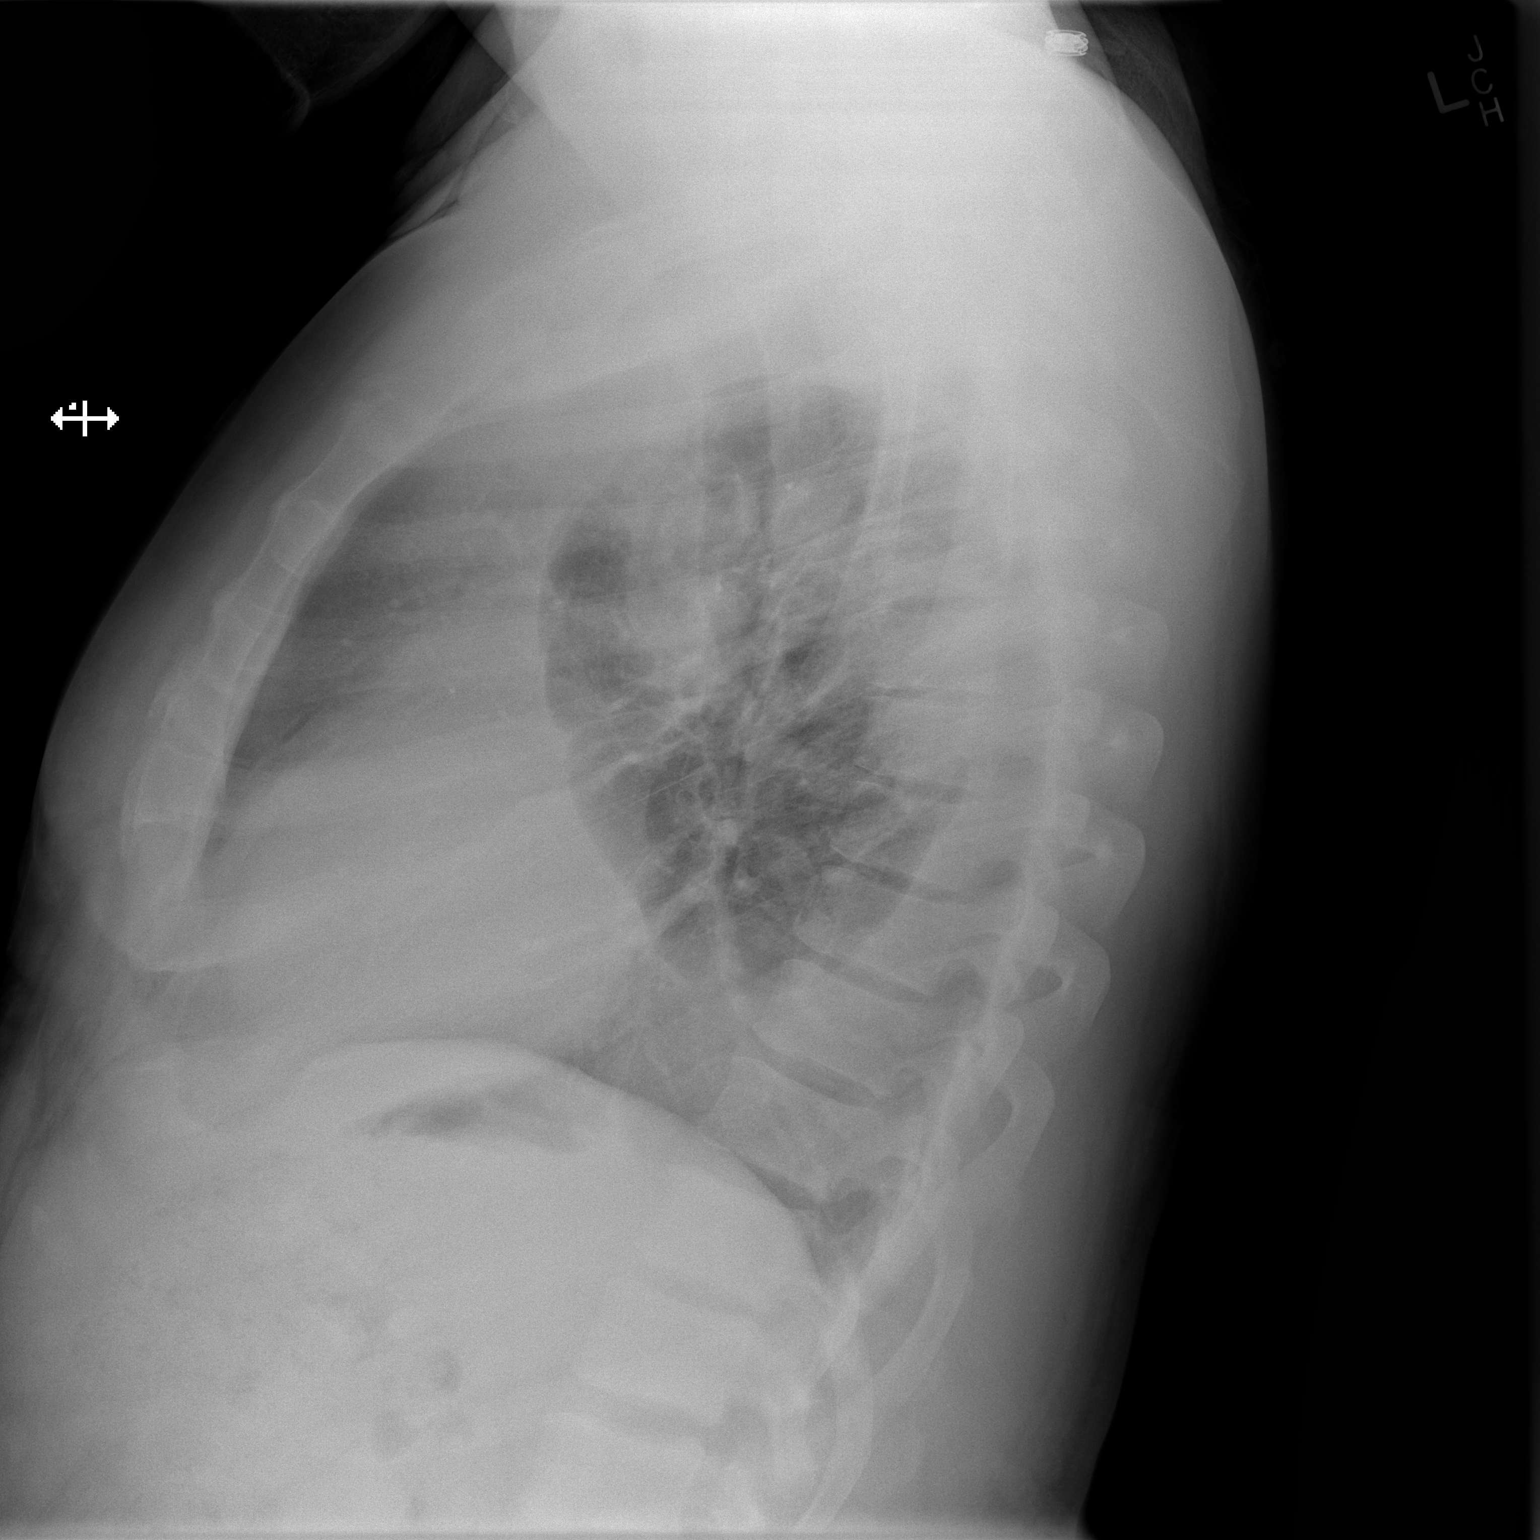

[2 of 2 positions shown; findings below may reference images not displayed]

FINDINGS: Moderately large right pleural effusion with slight
improvement from the prior study.  No pneumothorax.  Right lung
density is unchanged and may be atelectasis.  Underlying tumor or
infection could be present.

Left lung remains clear.
IMPRESSION: No pneumothorax post right thoracentesis.  There remains a
moderately large right pleural effusion and airspace disease in the
right lung.

## 2014-04-26 IMAGING — CR DG CHEST 1V PORT
1 series · 1 of 1 positions shown · non-contrast
Comparison: 12/18/2012, [DATE] hours.

CLINICAL DATA: Endotracheal tube.

PORTABLE CHEST - 1 VIEW

[AP]
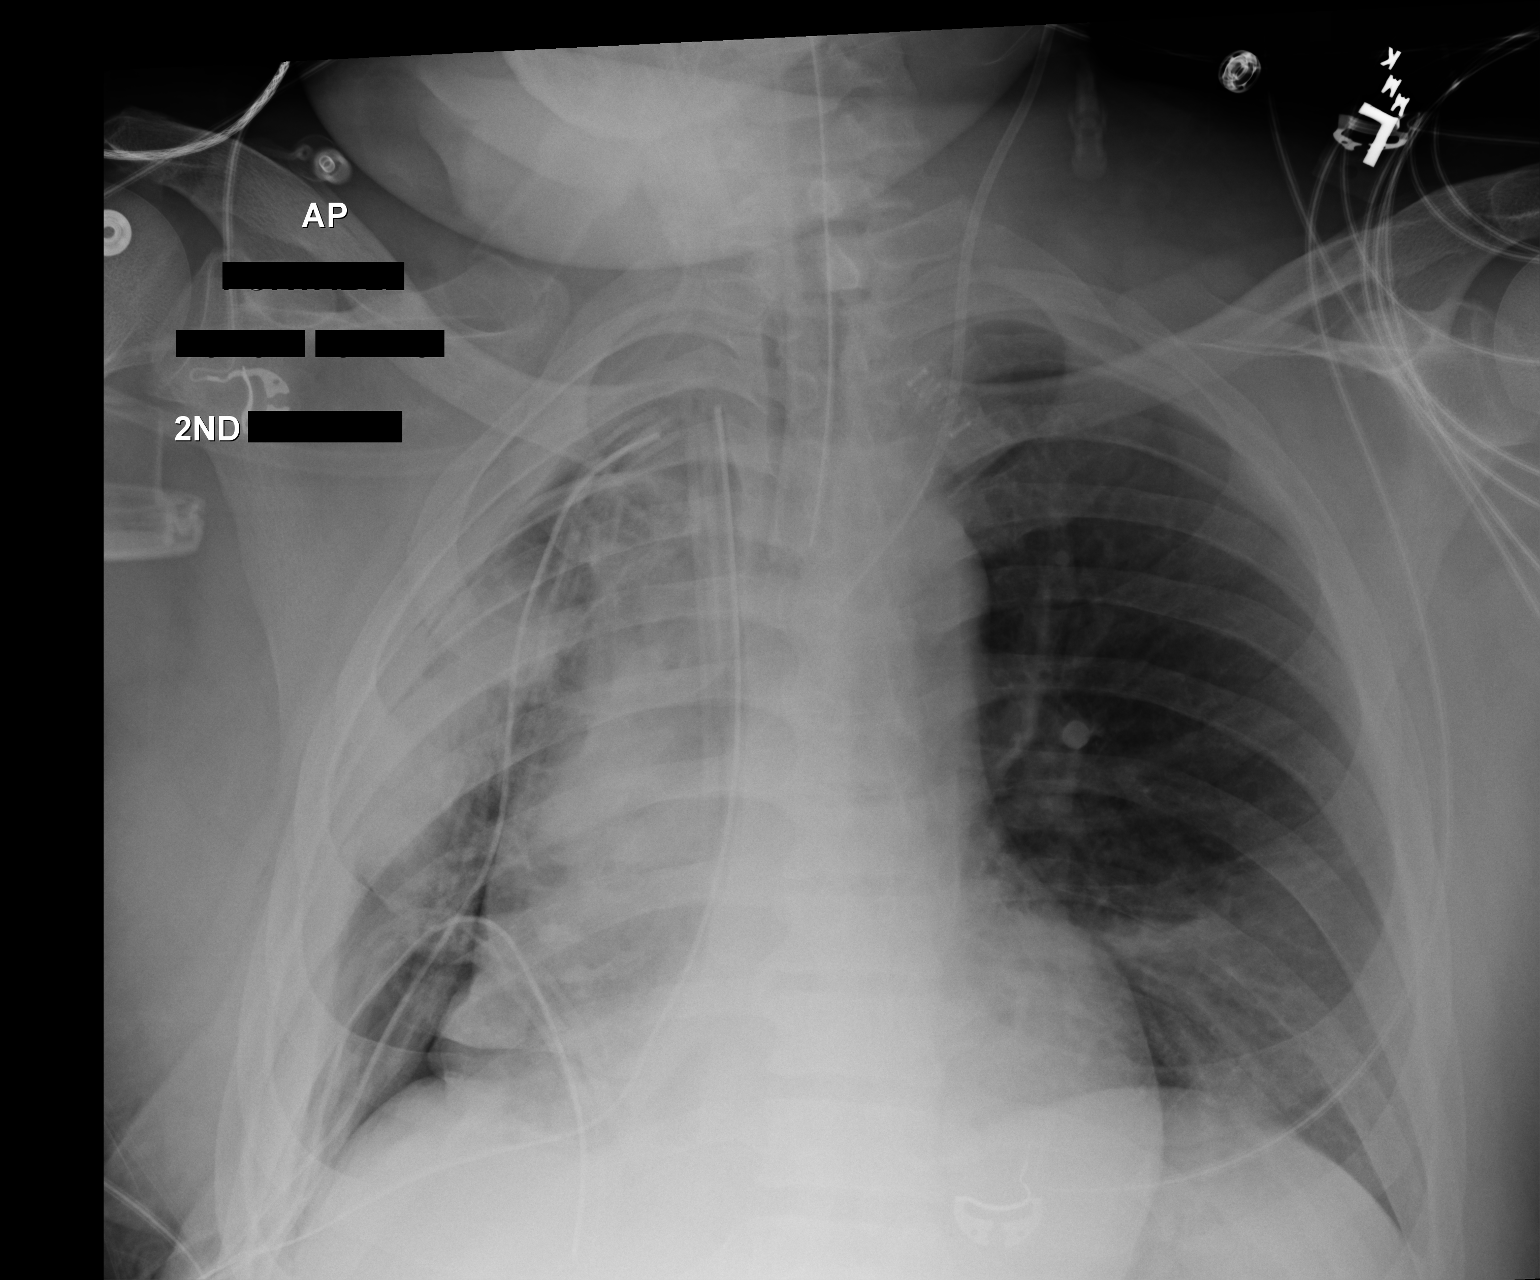

[1 of 1 positions shown; findings below may reference images not displayed]

FINDINGS: Left IJ central line is present with the tip in the left
brachiocephalic vein.  The endotracheal tube has been advanced and
is now 38 mm from the carina.  There are three right thoracostomy
tubes.  Right lung appears unchanged compared to prior with basilar
medial pneumothorax.  Left lung appears similar with left basilar
atelectasis.
IMPRESSION: 1.  Endotracheal tube 38 mm from the carina, in good position.
2.  Other support apparatus unchanged.  Unchanged medial right
basilar pneumothorax.

## 2014-04-26 IMAGING — CR DG CHEST 1V PORT
1 series · 1 of 1 positions shown · non-contrast
Comparison: 12/13/2012

CLINICAL DATA: ET tube placement

PORTABLE CHEST - 1 VIEW

[AP]
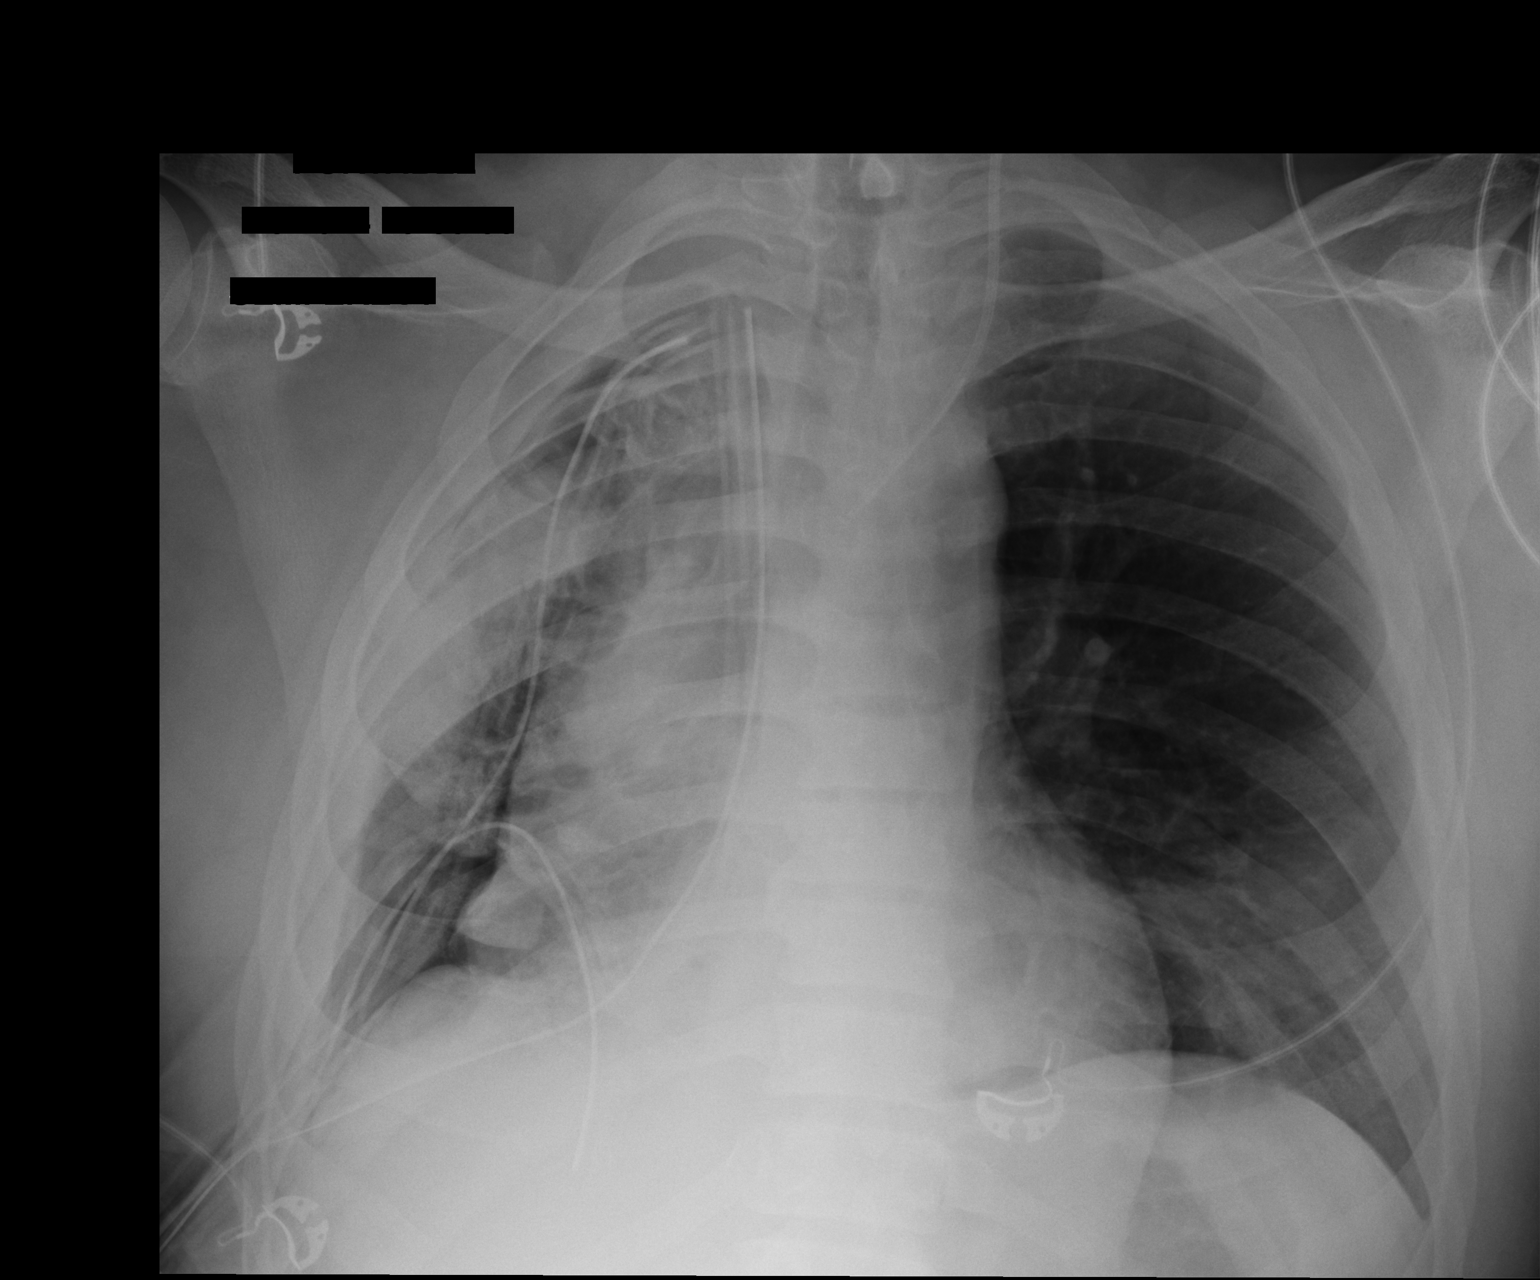

[1 of 1 positions shown; findings below may reference images not displayed]

FINDINGS: The patient is status post right-sided VATS. Three right
chest tubes are present.  There is decreased right effusion
compared with priors.     Suspect small right basilar pneumothorax.
ET tube 11.3 cm above carina and should be advanced 6-7 cm.  Left
IJ central venous line the tip in the left innominate and should be
advanced slightly.
IMPRESSION: ET tube too high.  Advanced 6-7 cm.  Status post right-sided VATS.
Three right chest tubes in good position with suspected small
basilar pneumothorax.

## 2014-04-27 IMAGING — CR DG CHEST 1V PORT
1 series · 1 of 1 positions shown · non-contrast
Comparison: Portable chest x-ray of 12/18/2012

CLINICAL DATA: Status post right VATS, follow-up

PORTABLE CHEST - 1 VIEW

[AP]
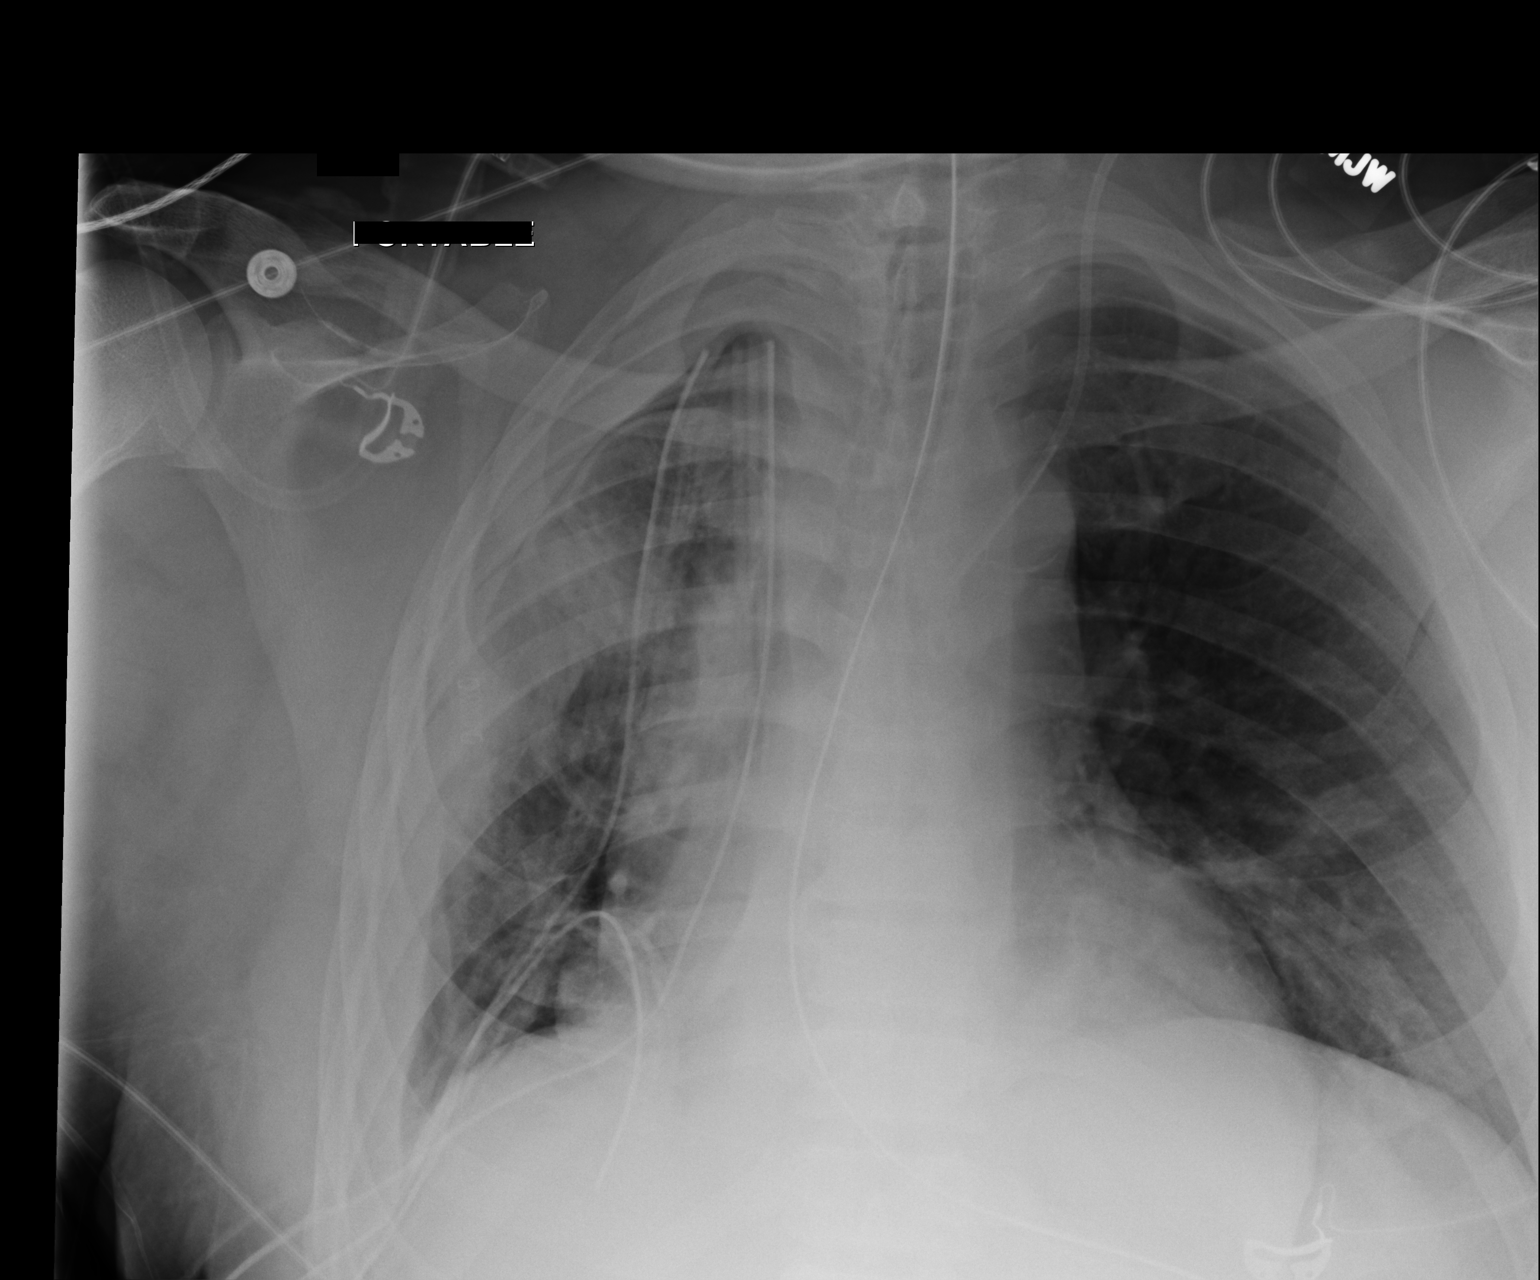

[1 of 1 positions shown; findings below may reference images not displayed]

FINDINGS: Right chest tubes remain and a small right apical
pneumothorax is unchanged with a small basilar component as well.
Volume loss on the right is stable with parenchymal opacity in the
right mid lung and probable right effusion.  The left lung is
clear.  The tip of the endotracheal tube remains 3.9 cm above the
carina.  Cardiomegaly is stable.
IMPRESSION: 1.  Right chest tubes remain with no change in small right
pneumothorax.
2.  No change in volume loss on the right with probable right
effusion.

## 2014-04-28 IMAGING — CR DG CHEST 1V PORT
1 series · 1 of 1 positions shown · non-contrast
Comparison: 12/19/2012

CLINICAL DATA: VATS.  Chest tube.

PORTABLE CHEST - 1 VIEW

[AP]
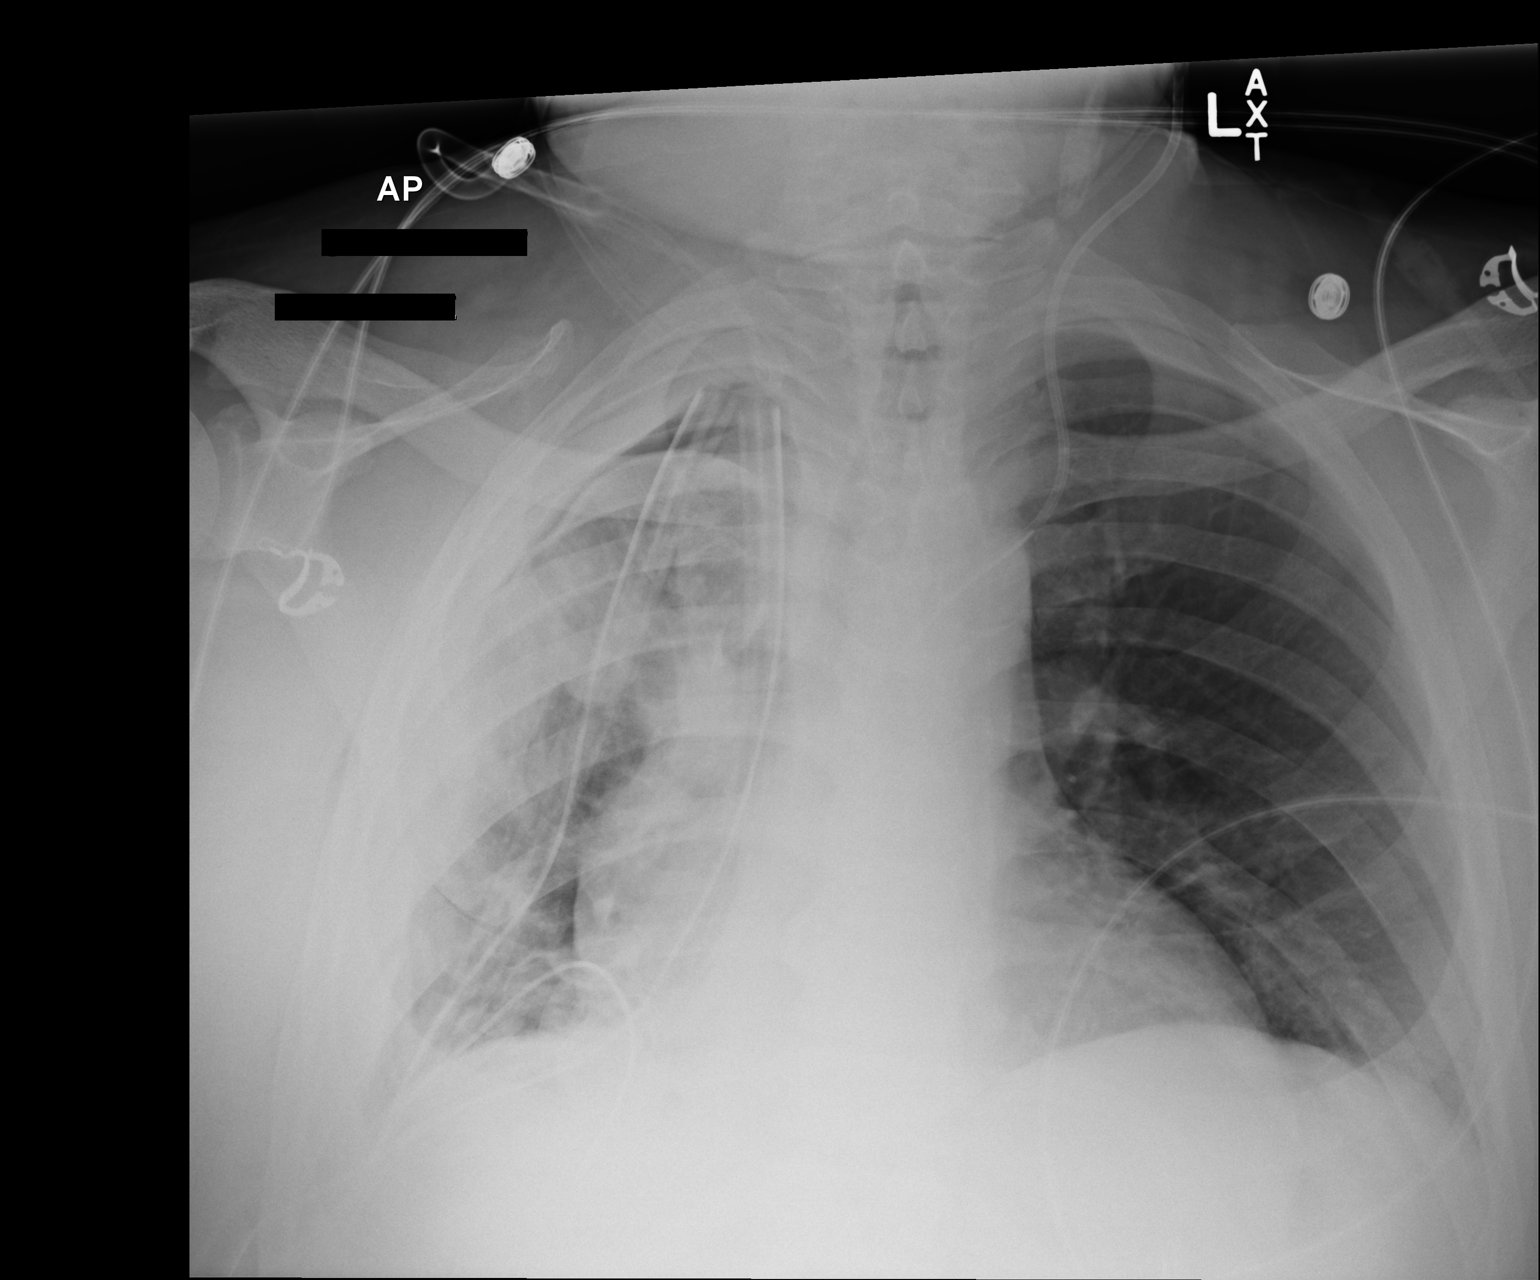

[1 of 1 positions shown; findings below may reference images not displayed]

FINDINGS: Three right chest tubes remain in place.  Stable right
apical pneumothorax.  Volume loss on the right with patchy right
lung airspace disease, also stable.  No confluent opacity on the
left.

Interval extubation and removal of NG tube.  Left central line is
unchanged.
IMPRESSION: Interval extubation.  Otherwise no significant change.

## 2014-04-29 IMAGING — CR DG CHEST 1V PORT
1 series · 1 of 1 positions shown · non-contrast
Comparison: 12/20/2012

CLINICAL DATA: Right VATS, chest tubes

PORTABLE CHEST - 1 VIEW

[AP]
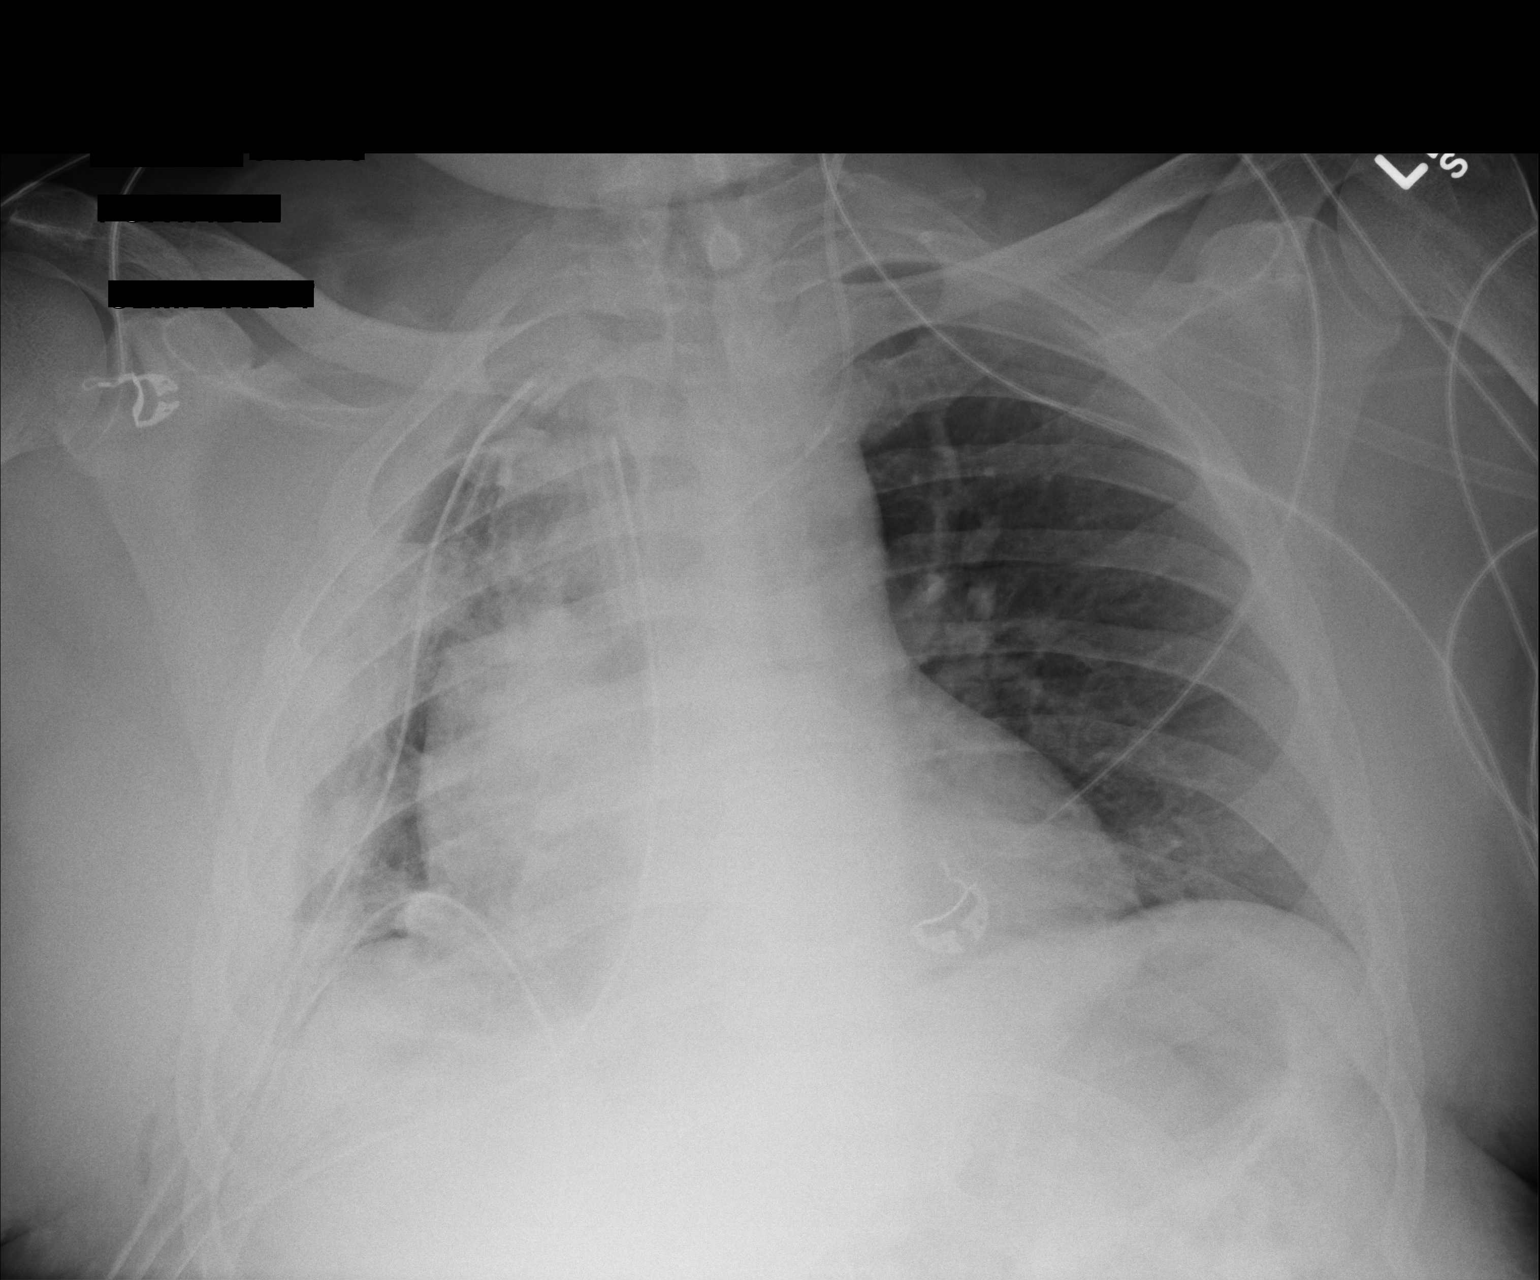

[1 of 1 positions shown; findings below may reference images not displayed]

FINDINGS: Three right chest tubes remain.  Although not as well
demonstrated because of positioning and technique, the small
residual right apical pneumothorax appears to be grossly stable.
Pleural density persist on the right compatible with residual
pleural fluid versus pleural thickening.  Stable volume loss and
airspace disease/atelectasis in the right lung.  Left lung remains
clear.  Stable cardiac enlargement but normal vascularity.
IMPRESSION: Stable postoperative appearance

## 2014-04-30 IMAGING — CR DG CHEST 1V PORT
1 series · 1 of 1 positions shown · non-contrast
Comparison: 12/21/2012

CLINICAL DATA: Pleural effusion

PORTABLE CHEST - 1 VIEW

[AP]
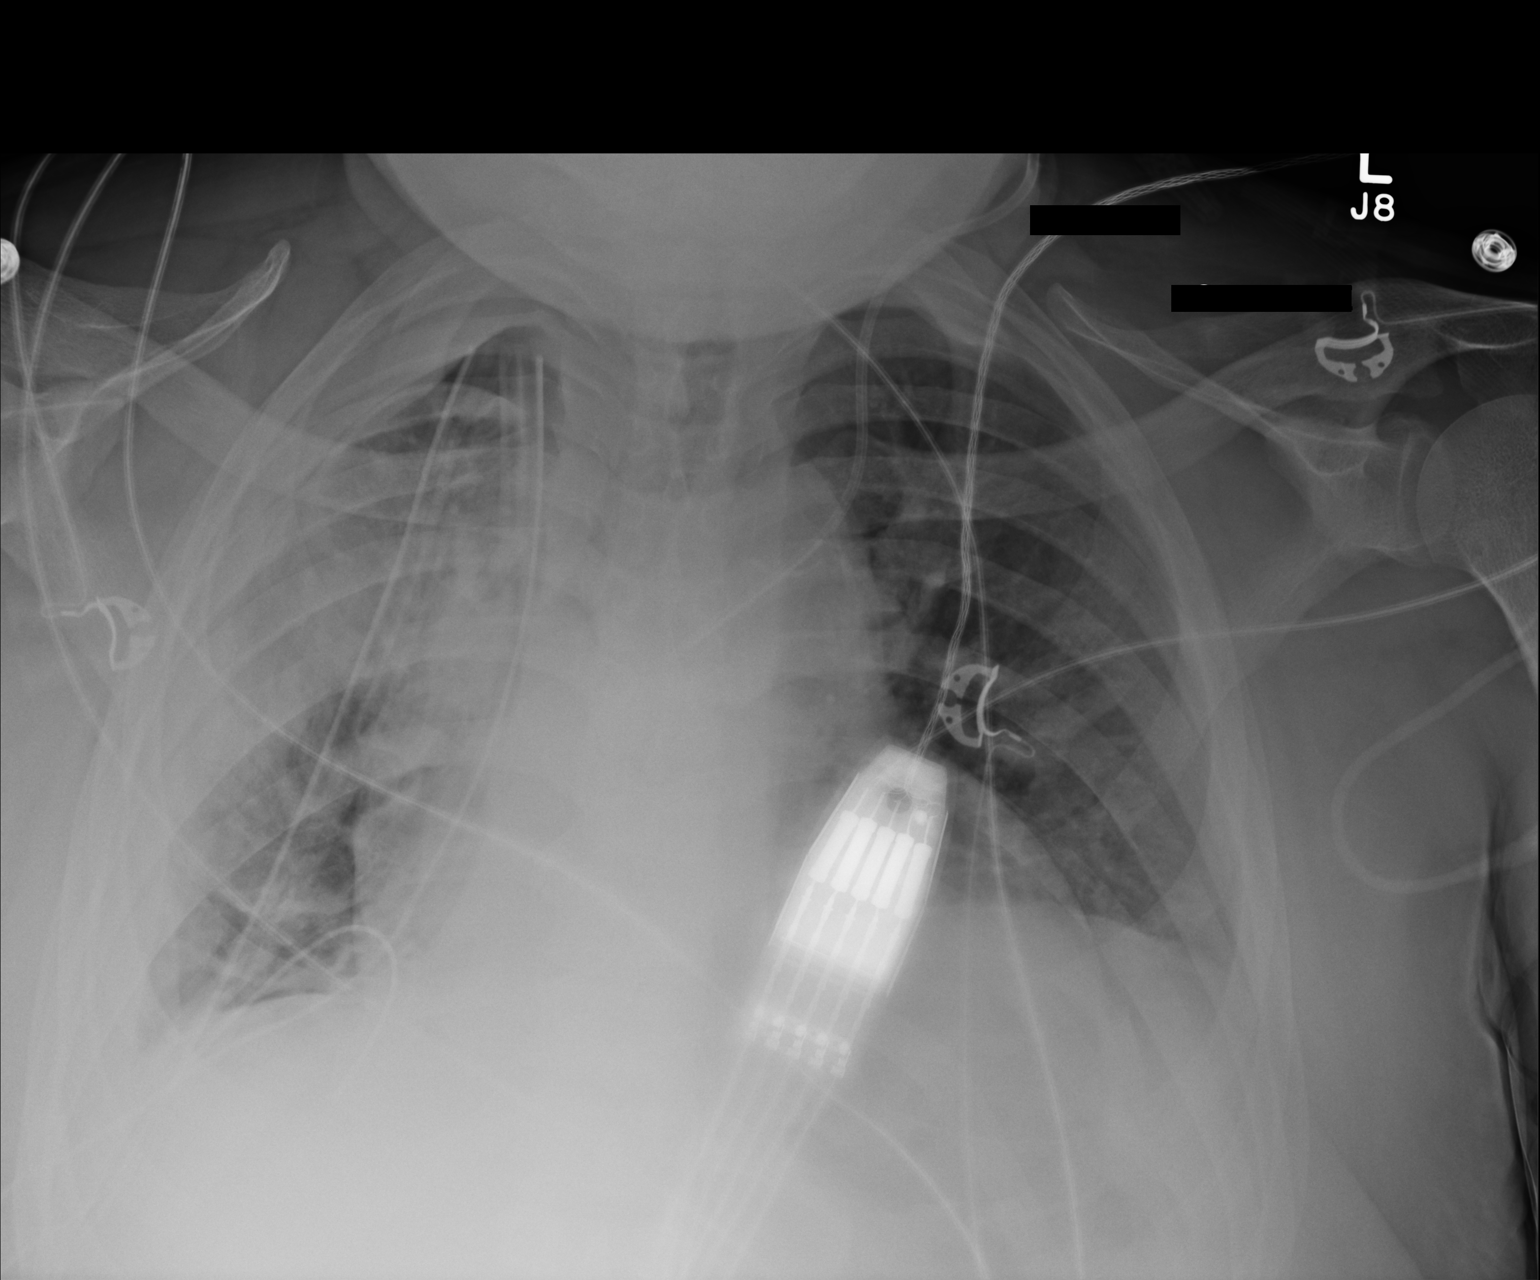

[1 of 1 positions shown; findings below may reference images not displayed]

FINDINGS: 5471 hours.  Lung volumes are low.  To right chest tubes
remain in place.  No substantial change and small right apical
pneumothorax.  The pleuroparenchymal opacity the right base is
stable. The cardiopericardial silhouette is enlarged.  Left IJ
central line tip overlies the central mediastinum. Telemetry leads
overlie the chest.
IMPRESSION: No substantial change exam.

## 2014-05-01 IMAGING — CR DG CHEST 1V PORT
1 series · 1 of 1 positions shown · non-contrast
Comparison: One-view chest 12/22/2012.

CLINICAL DATA: Status post decortication.

PORTABLE CHEST - 1 VIEW

[AP]
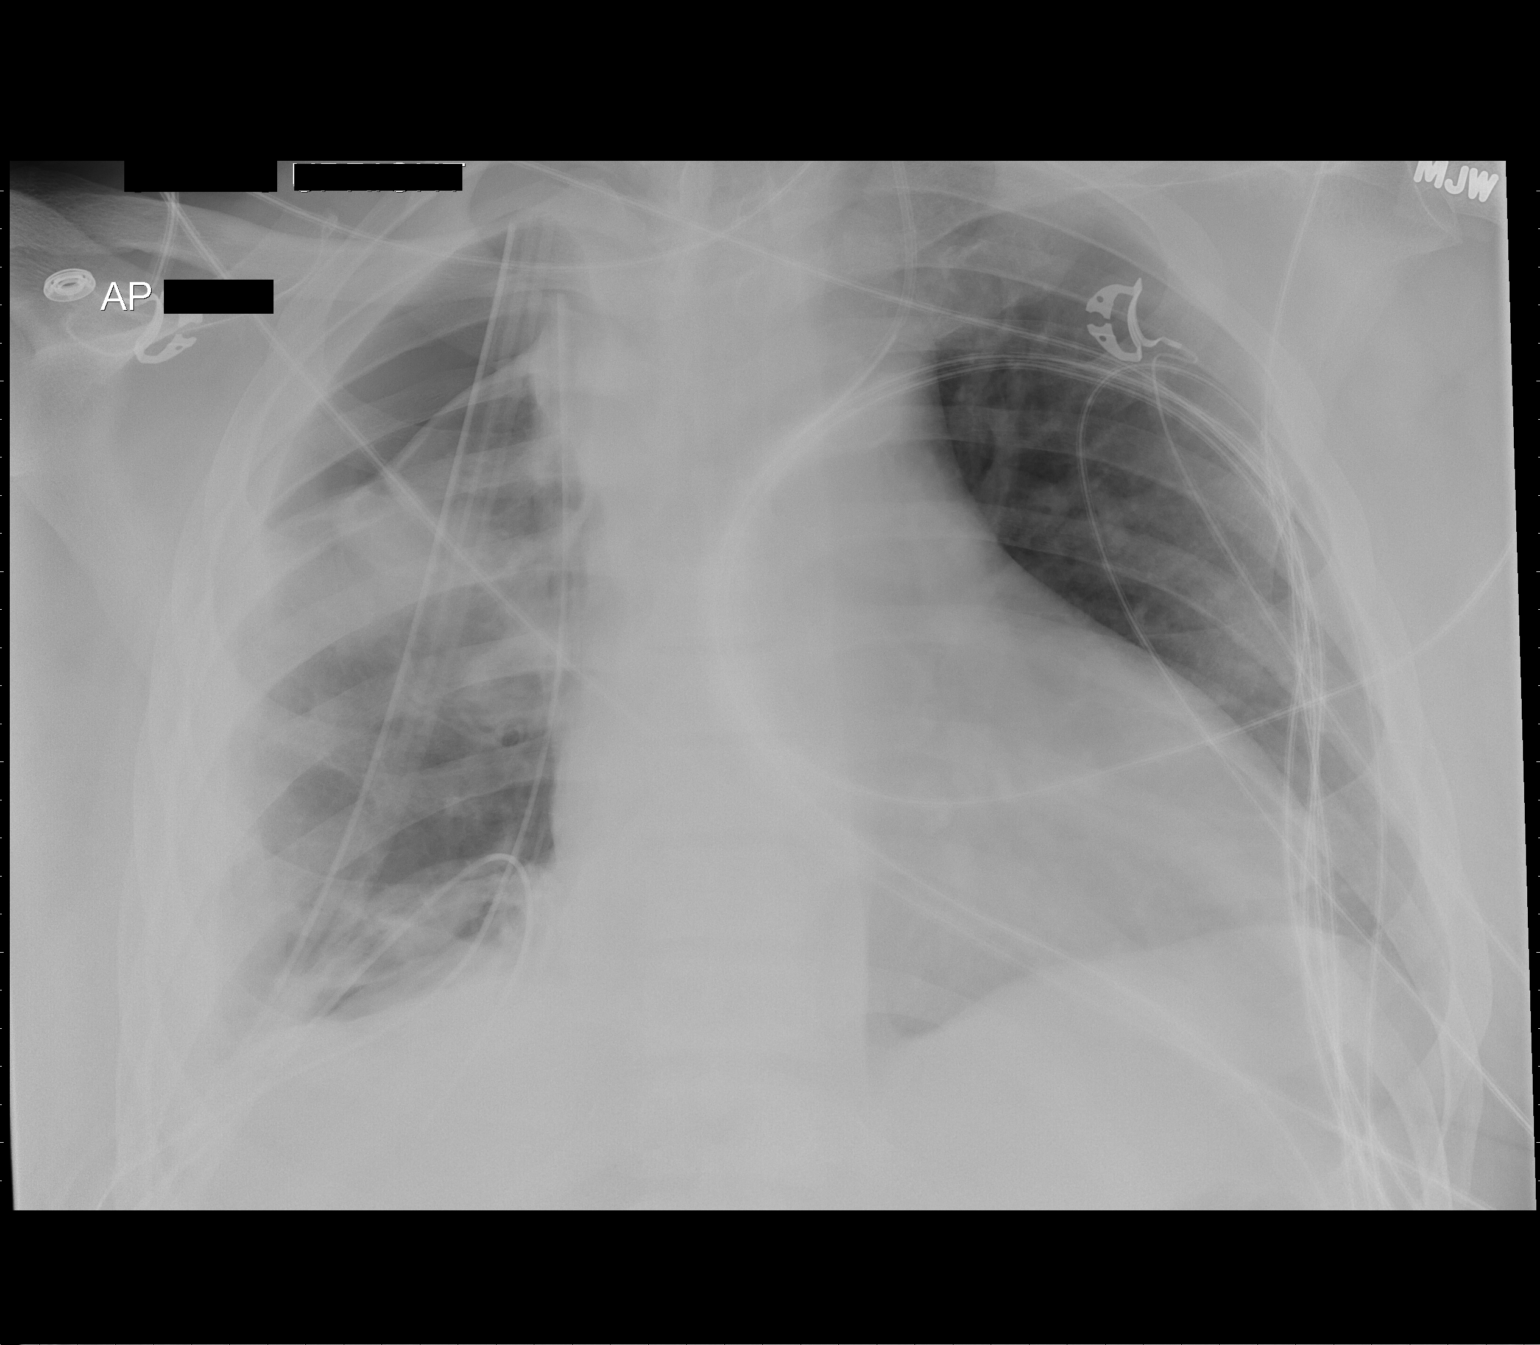

[1 of 1 positions shown; findings below may reference images not displayed]

FINDINGS: Two right-sided chest tubes are in place.  A moderate-
sized right to pneumothorax is similar to slightly increased in
size.  The right pleural effusion persists.  The right IJ line is
unchanged.  Low lung volumes persist.
IMPRESSION: 1.  Stable to slight increase in a right sided hydropneumothorax.
2.  Stable appearance of right-sided chest tubes.
3.  Persistent low lung volumes.

## 2014-05-02 IMAGING — CR DG CHEST 1V PORT
1 series · 1 of 1 positions shown · non-contrast
Comparison: Chest x-ray dated 12/23/1998 [DATE]

CLINICAL DATA: Right pleural effusion treated with decortication of
the right lung.

PORTABLE CHEST - 1 VIEW

[AP]
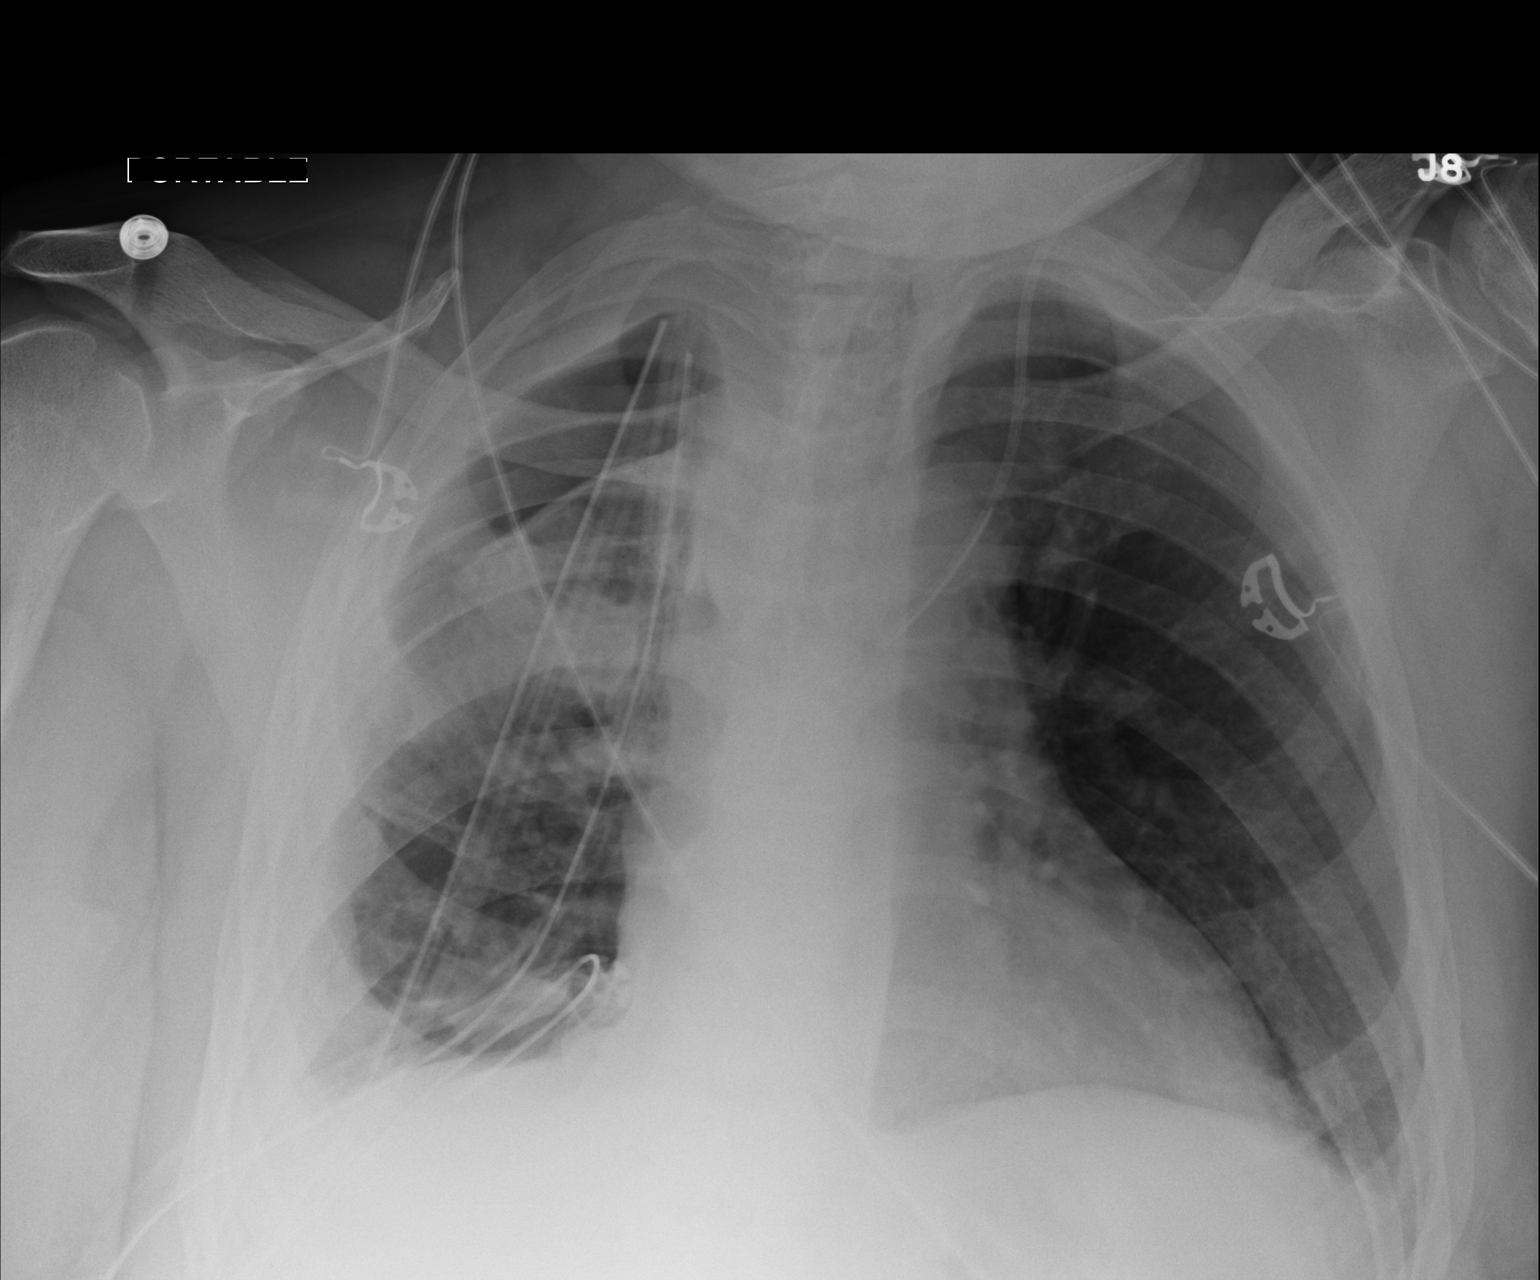

[1 of 1 positions shown; findings below may reference images not displayed]

FINDINGS: Three right-sided chest tubes are in place.  Small right
apical pneumothorax is unchanged. Slight residual pleural
thickening diffusely in the right hemithorax. Subtle improvement in
the aeration at the right lung.

Central venous catheter tip is in the region of the left innominate
vein, unchanged.  Left lung is clear.
IMPRESSION: Slight improvement in the aeration of the right lung.  No change in
small right apical pneumothorax.

## 2014-05-04 IMAGING — CR DG CHEST 1V PORT
2 series · 2 of 2 positions shown · non-contrast
Comparison: 12/25/2012.

CLINICAL DATA: Chest tubes in place.  Follow-up pneumothorax.

PORTABLE CHEST - 1 VIEW

[AP (1 of 2)]
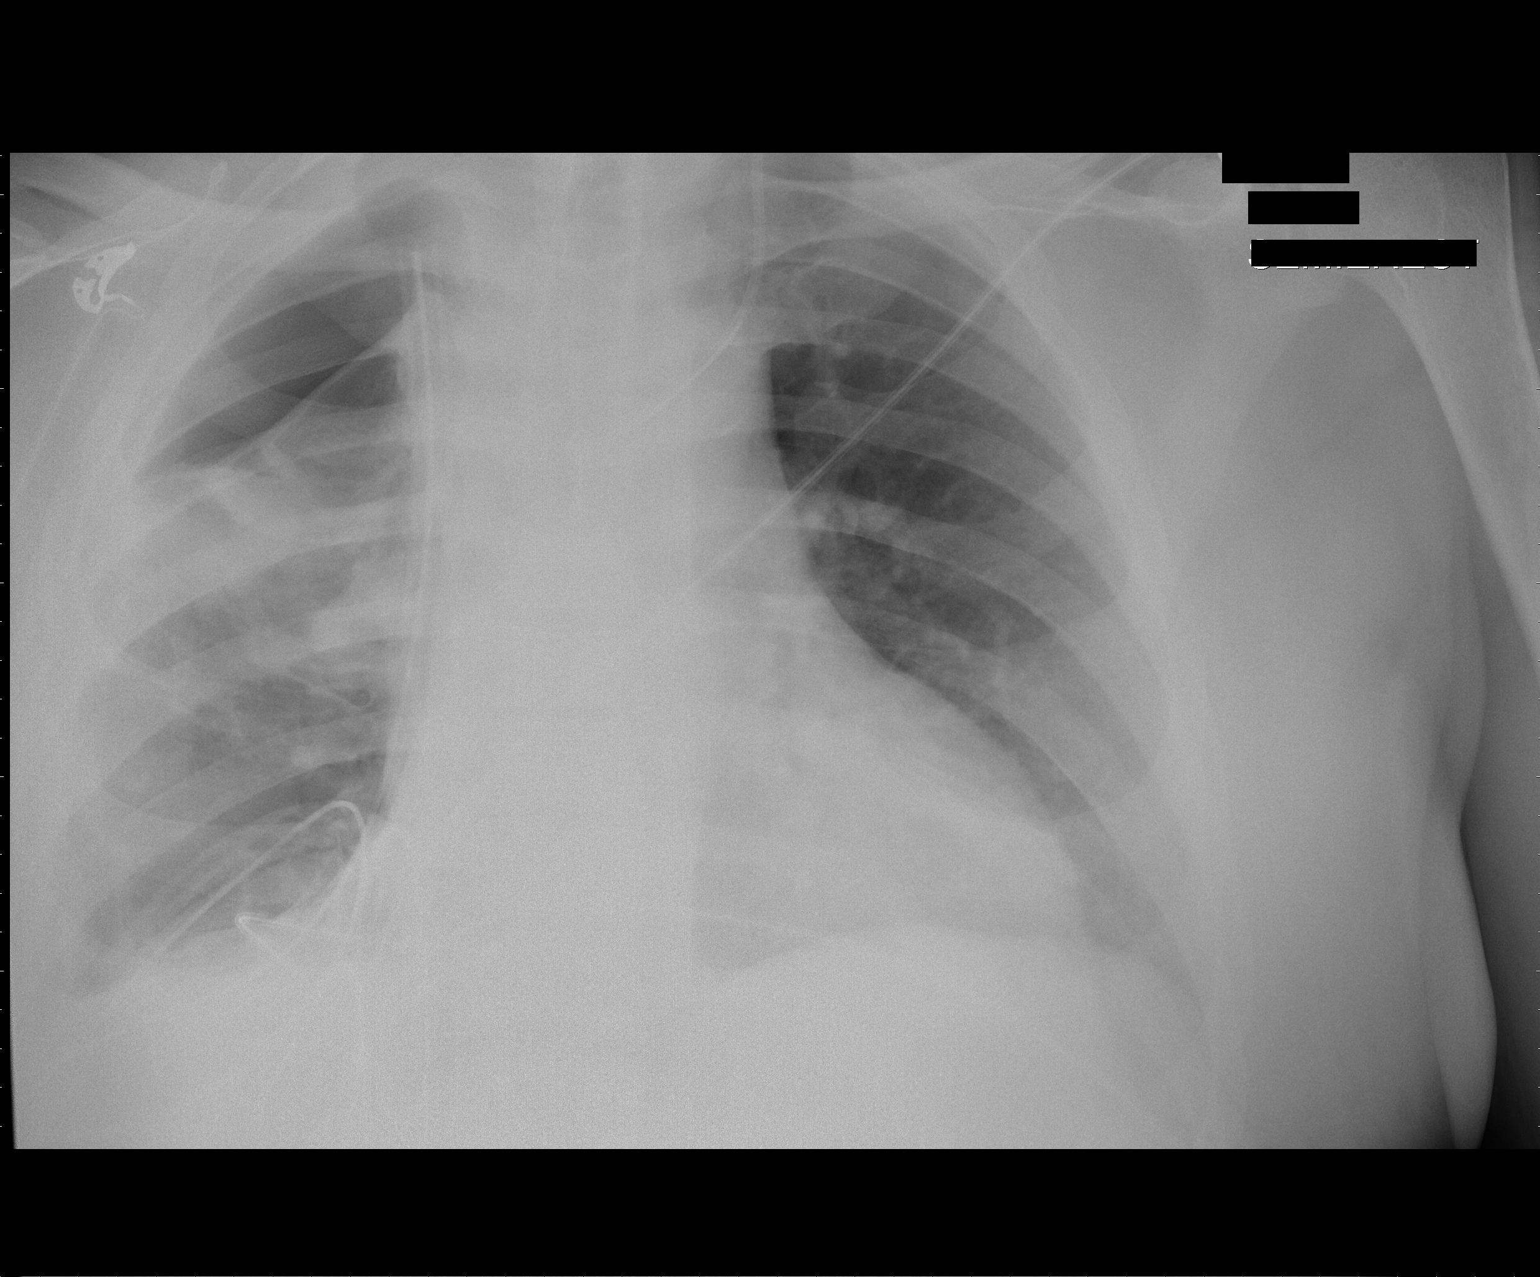

[AP (2 of 2)]
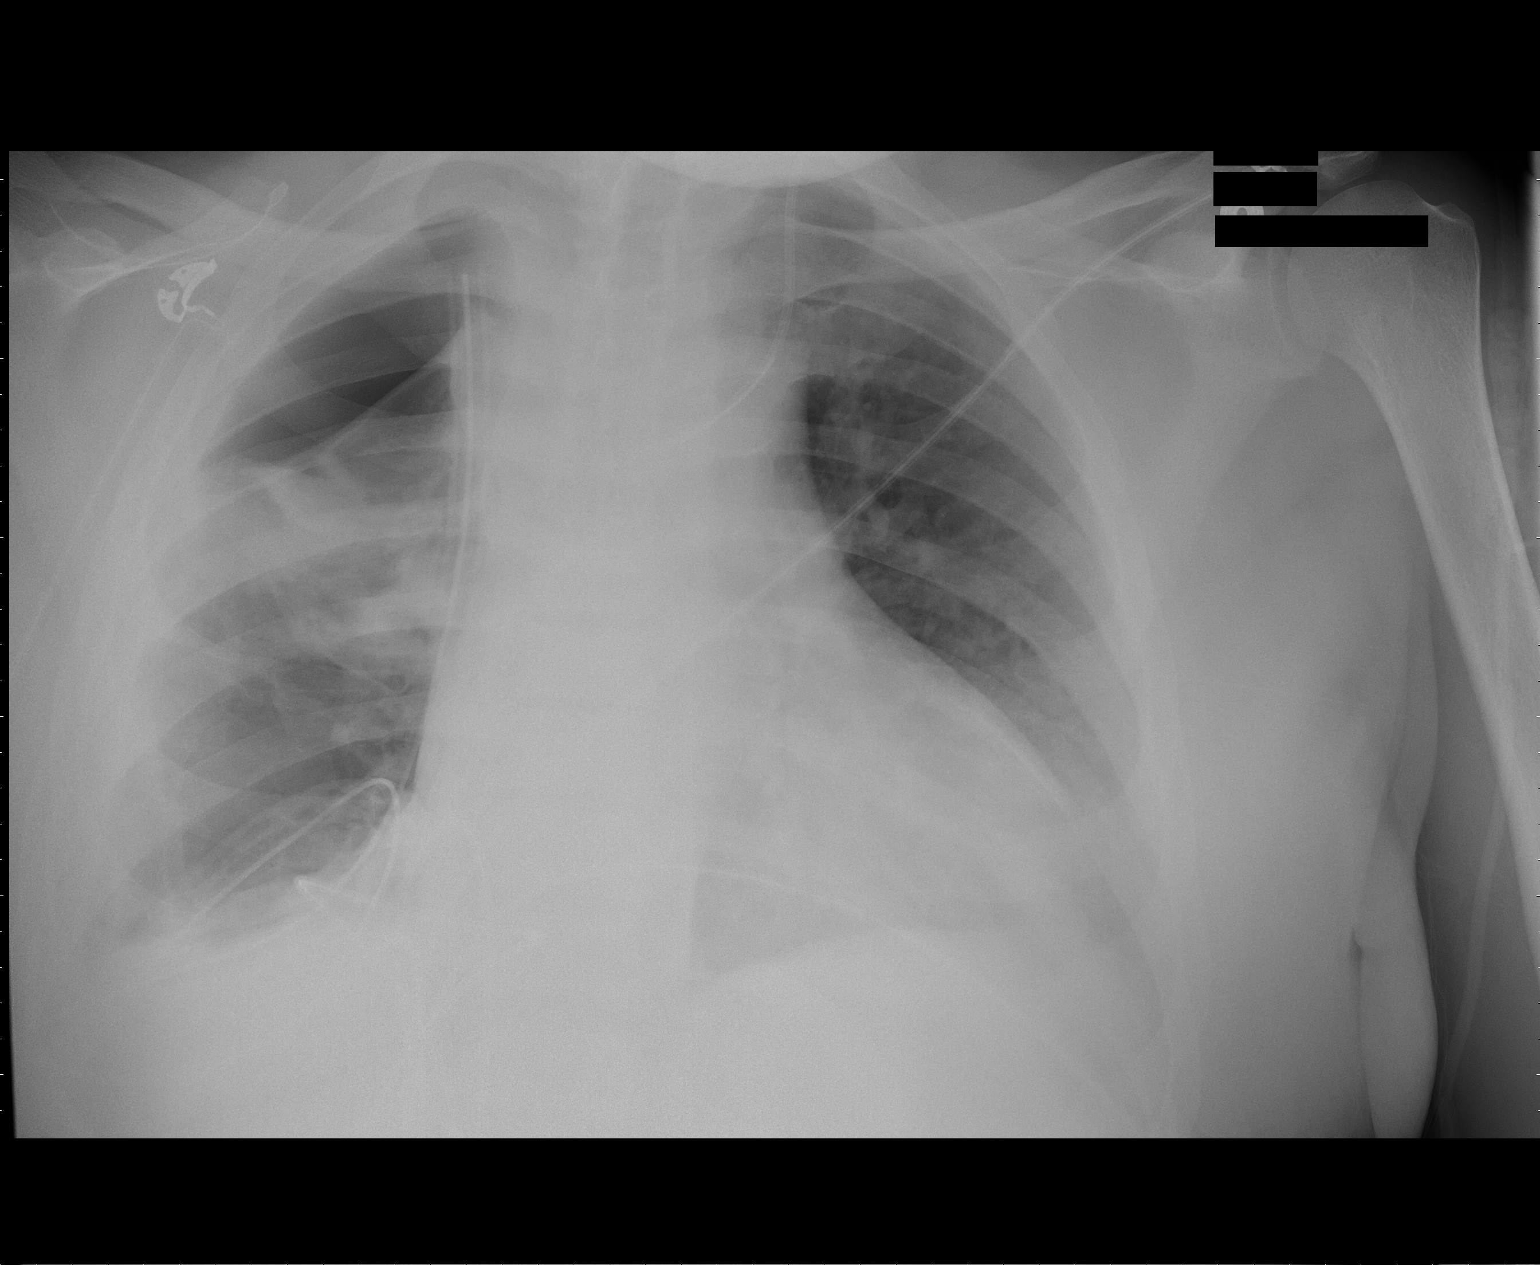

[2 of 2 positions shown; findings below may reference images not displayed]

FINDINGS: Persistent right apical pneumothorax, possibly loculated,
without significant change since previous study.  To right chest
tubes remain unchanged in position.  There is atelectasis or
consolidation in the right lung.  The left lung appears to be clear
expanded.  Mild cardiac enlargement with normal pulmonary
vascularity.  Left central venous catheter is unchanged.
IMPRESSION: Stable appearance of right apical pneumothorax and pulmonary
atelectasis with to right chest tubes in place.

## 2014-05-05 IMAGING — CR DG CHEST 1V PORT
1 series · 1 of 1 positions shown · non-contrast
Comparison: Prior chest x-ray 12/26/2012

CLINICAL DATA: Follow up pneumothorax

PORTABLE CHEST - 1 VIEW

[AP]
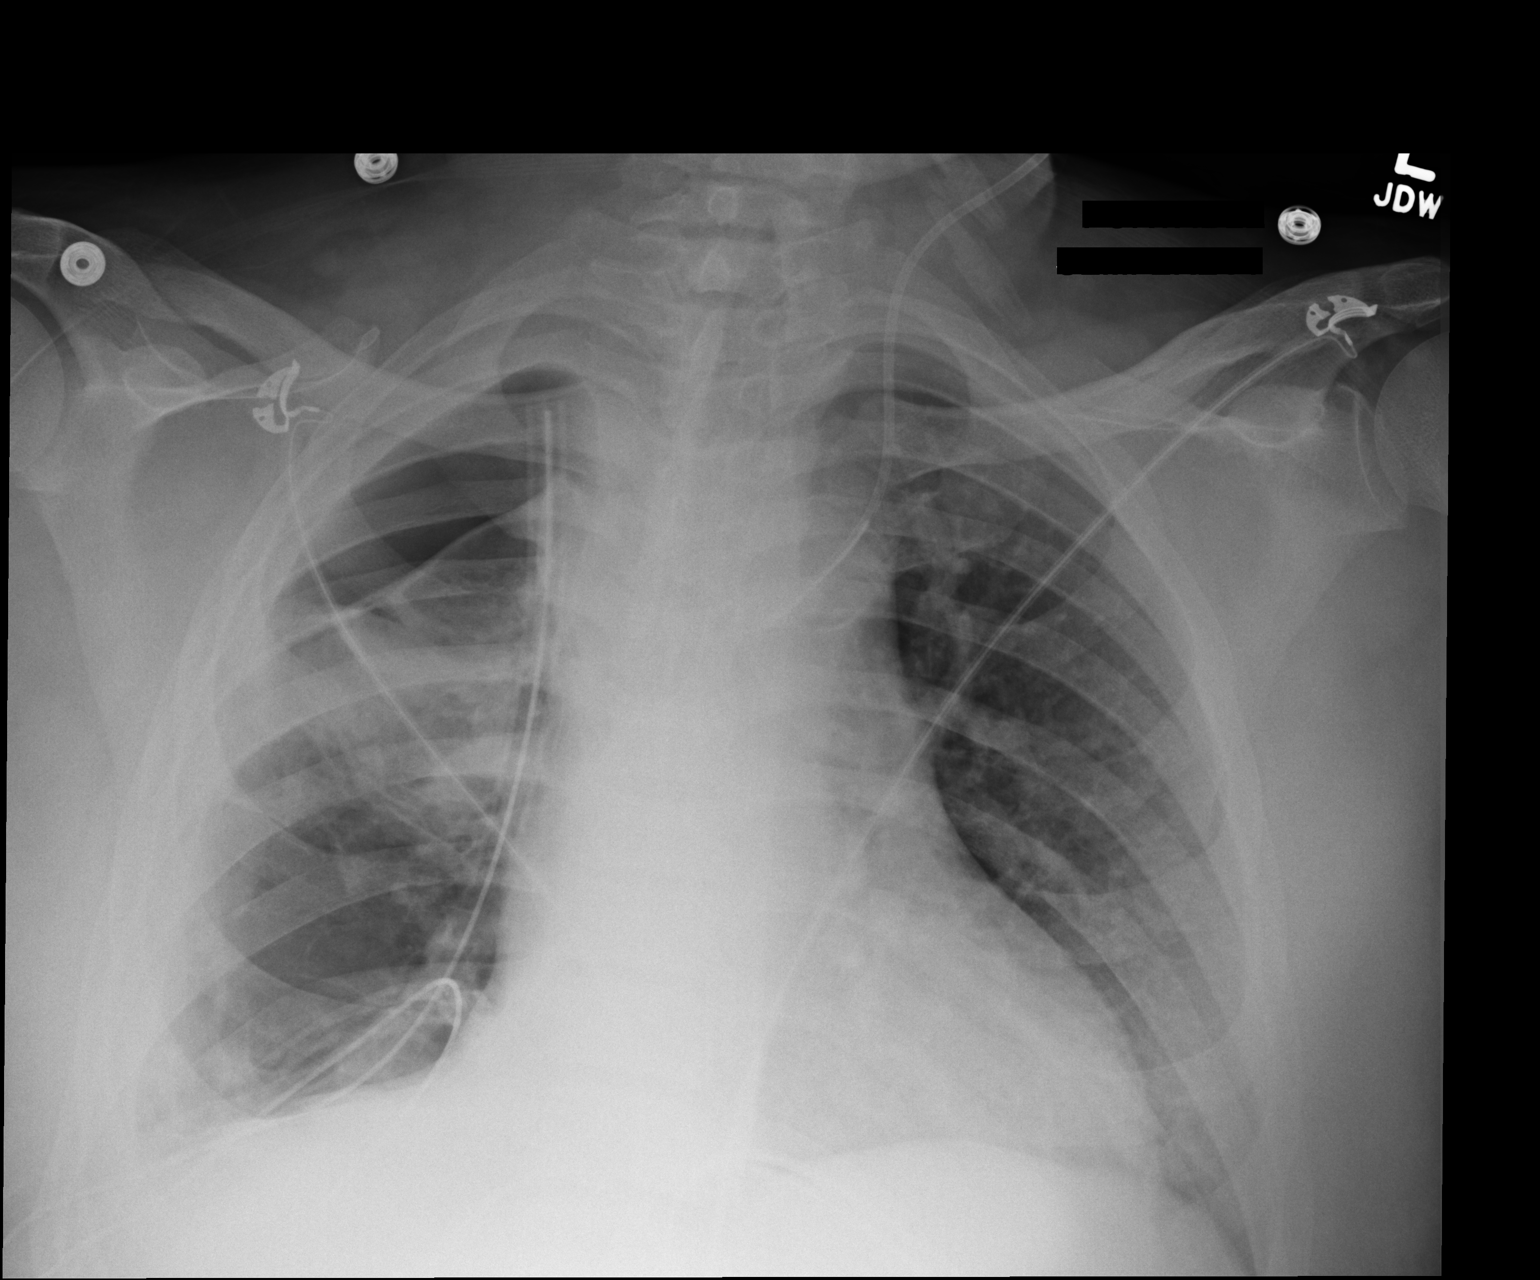

[1 of 1 positions shown; findings below may reference images not displayed]

FINDINGS: Stable right apical pneumothorax with approximately 10 -
15% in volume loss of the right upper lobe.  There is a similar
degree of pleural displacement (30 1 mm).  To right-sided
thoracostomy tubes remain in place.  Left IJ approach central
venous catheter is in unchanged position.  The tip projects over
the midline.  If the catheter is venous, this would be within the
left brachiocephalic vein.  Unchanged mild cardiomegaly.  Diffuse
pleural thickening versus residual fluid on the right is similar to
prior.  Improving opacities in the right mid lung.
IMPRESSION: 1.  Stable right apical pneumothorax.
2.  Improving opacities in the right midlung.
3.  Similar degree of residual pleural fluid versus pleural
thickening
4.  The left IJ central venous catheter projects over the midline.
If venous, it is within the mid left brachiocephalic vein.  If
there is concern for arterial placement, recommend confirmation
with blood gas evaluation drawn through the line.

## 2014-05-07 IMAGING — CR DG CHEST 1V PORT
1 series · 1 of 1 positions shown · non-contrast
Comparison: 12/27/2012

CLINICAL DATA: Post VATS.

PORTABLE CHEST - 1 VIEW

[AP]
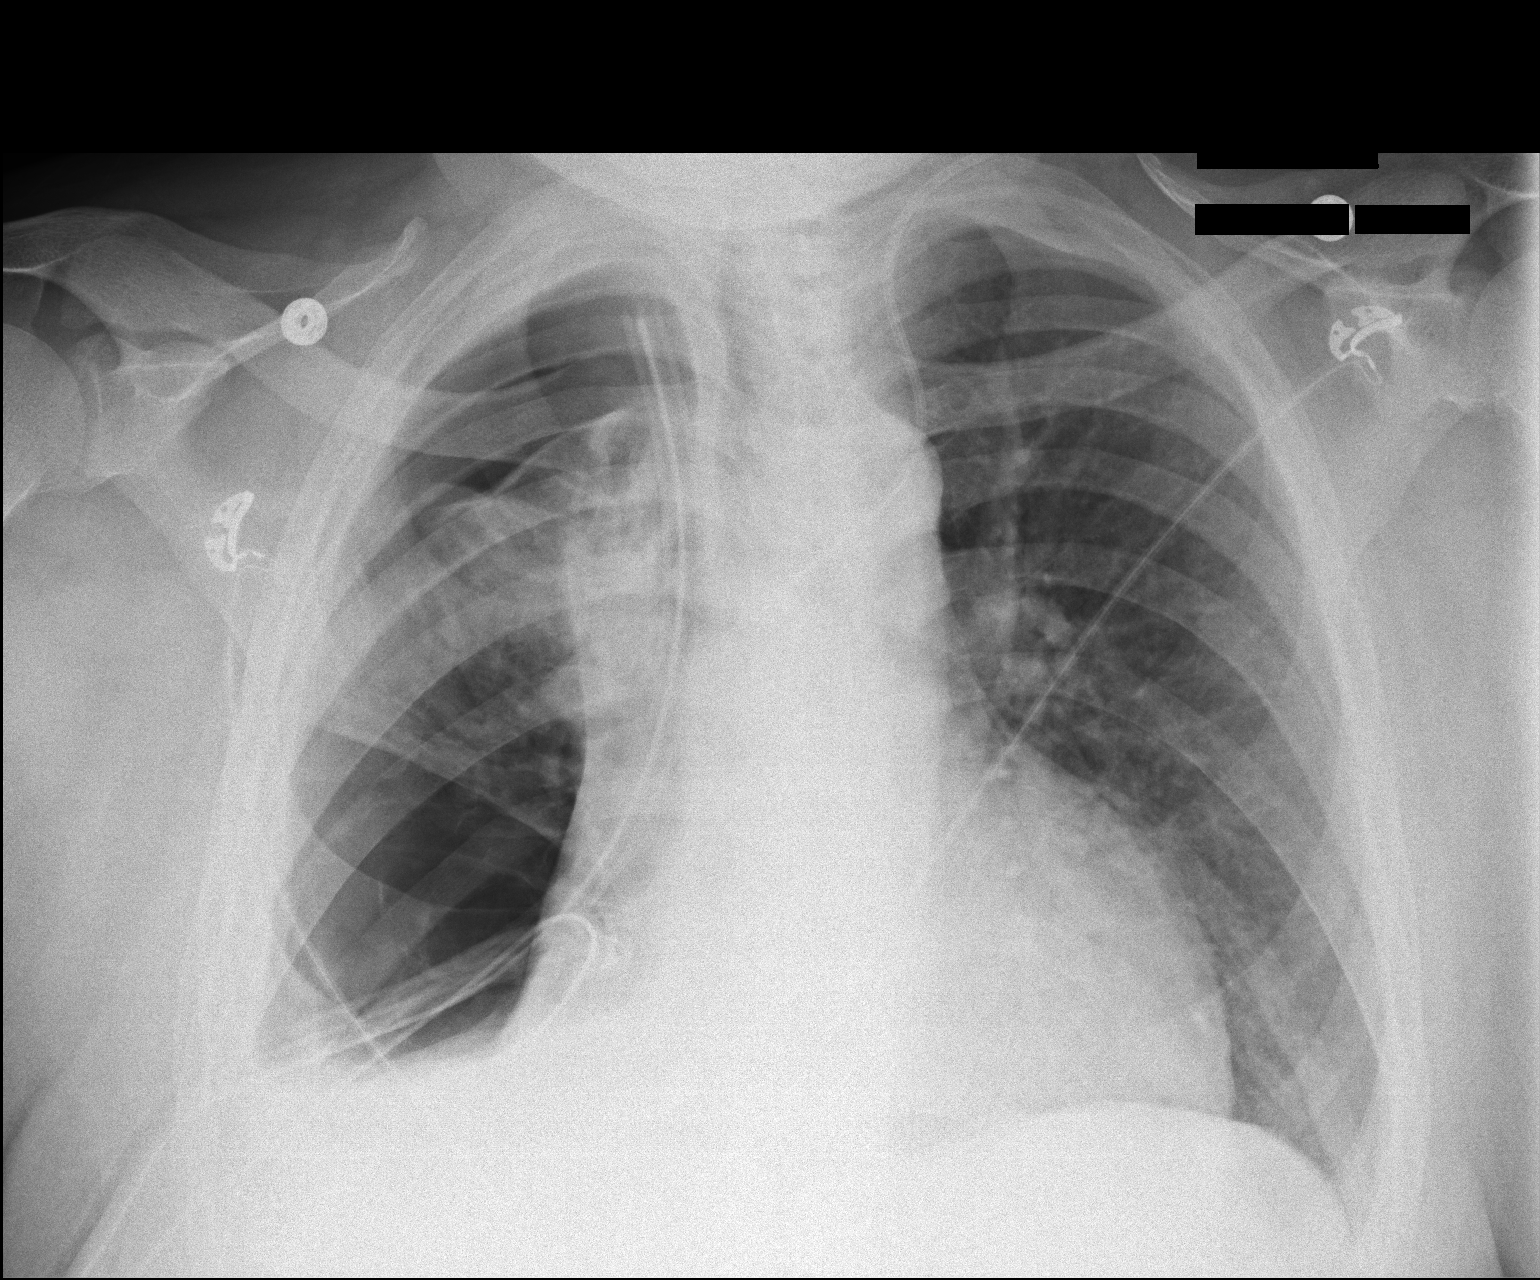

[1 of 1 positions shown; findings below may reference images not displayed]

FINDINGS: Right chest tubes remain in place, unchanged.  Moderate
sized right pneumothorax.  Continued opacity, likely atelectasis in
the right mid lung.  Heart is mildly enlarged.  Left central line
is unchanged.  No focal opacity on the left.
IMPRESSION: Stable exam.

## 2014-05-08 IMAGING — CR DG CHEST 2V
2 series · 2 of 2 positions shown · non-contrast
Comparison: 12/29/2012; 12/27/2012; 12/24/2012

CLINICAL DATA: Evaluate pneumothorax

CHEST - 2 VIEW

[w chest pa]
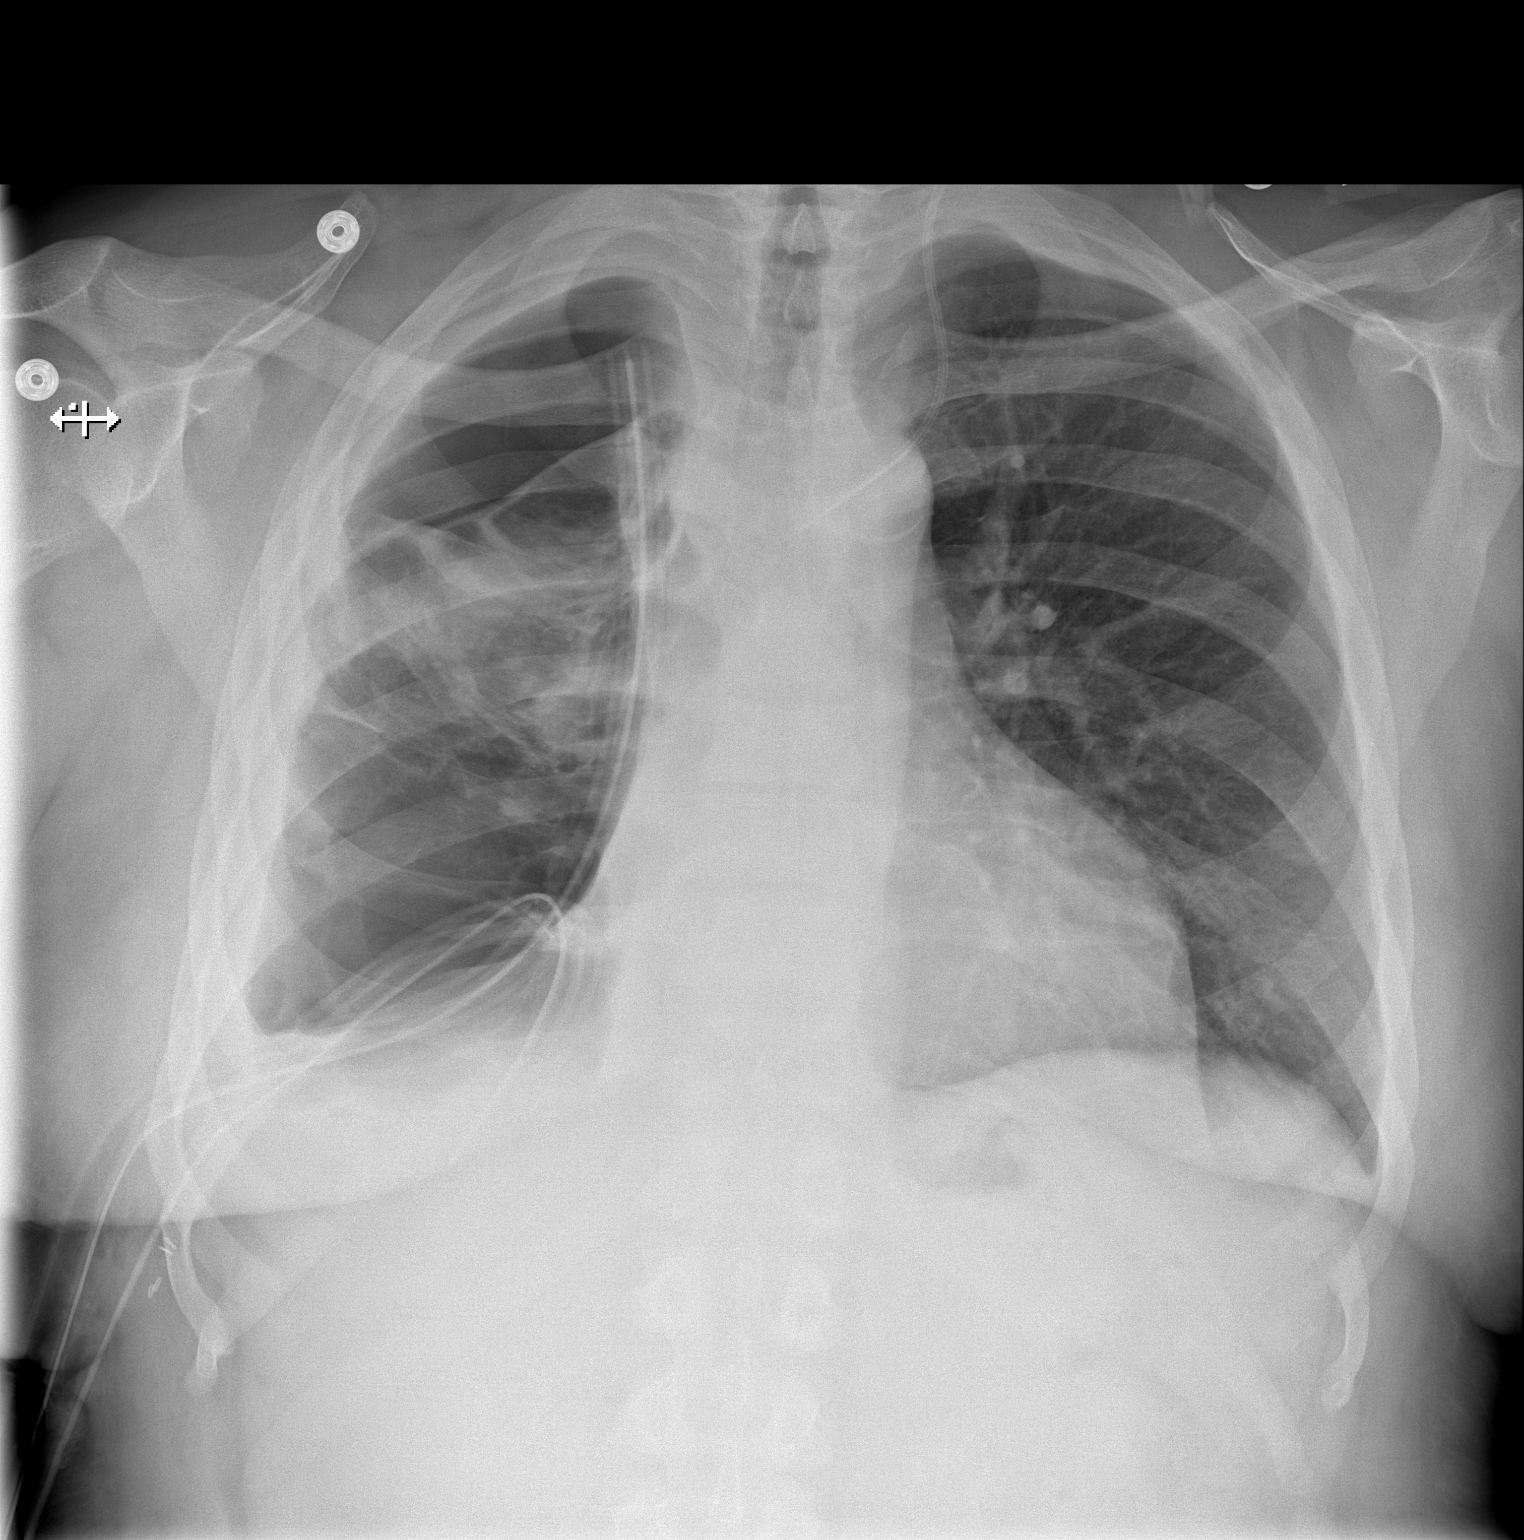

[w chest lat]
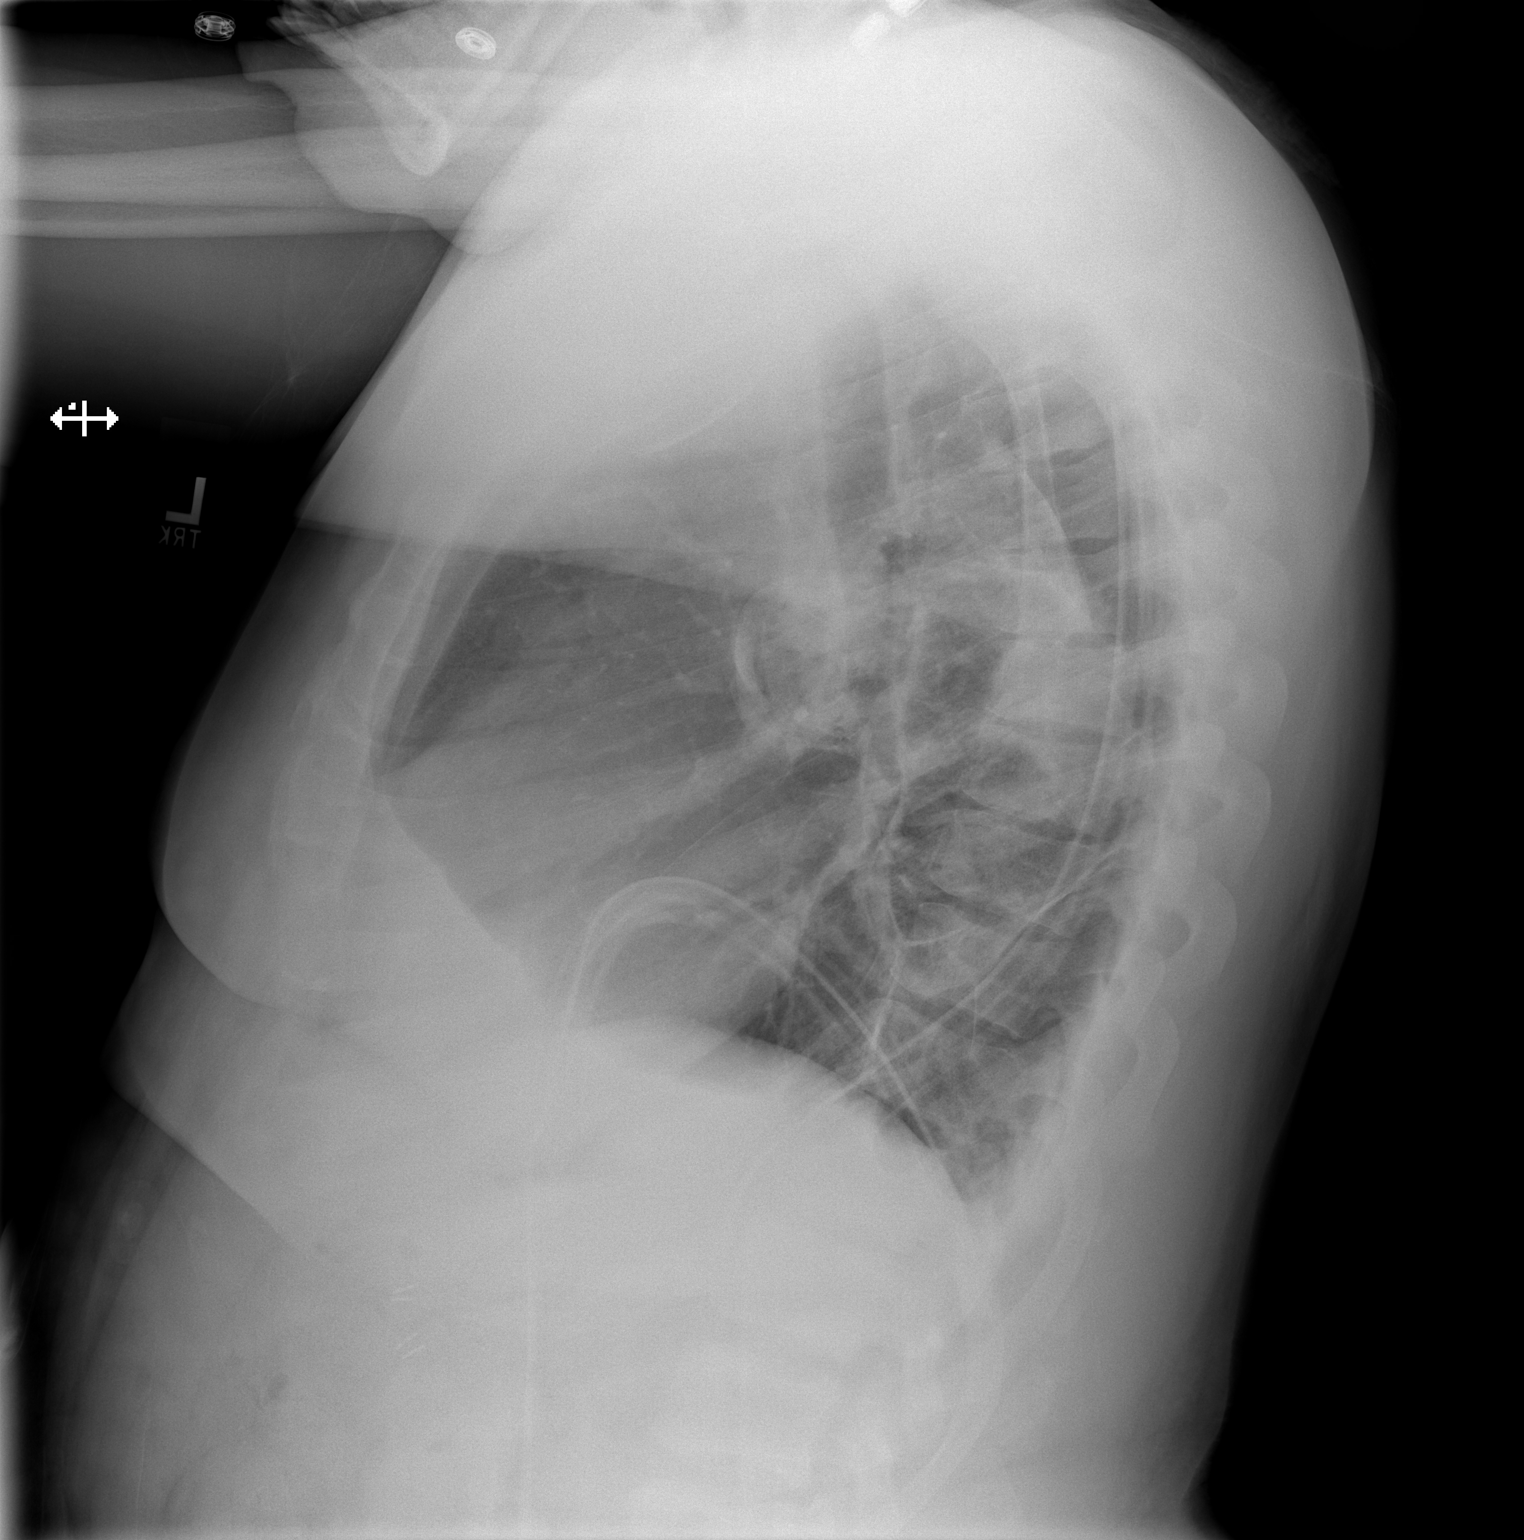

[2 of 2 positions shown; findings below may reference images not displayed]

FINDINGS: Grossly unchanged cardiac silhouette and mediastinal contours.
Stable positioning of support apparatus.  Suspected minimal
increase in the amount of fluid within the partially loculated
right-sided moderate to large hydropneumothorax with basilar
component.  Grossly unchanged minimal left basilar/retrocardiac
opacity favored to represent atelectasis. No new focal airspace
opacities.  Minimal improved aeration of the right perihilar lung.
Unchanged bones.
IMPRESSION: 1.  Stable positioning of support apparatus.  Minimal increase in
the amount of fluid within the moderate to large right sided
hydropneumothorax.

2.  Minimal improved aeration in the right perihilar lung.

## 2014-05-14 IMAGING — CR DG CHEST 2V
1 series · 2 of 2 positions shown · non-contrast
Comparison: none

REASON FOR EXAM: fever, dialysis, right chest tube since [DATE]
COMMENTS:

PROCEDURE:     DXR - DXR CHEST PA (OR AP) AND LATERAL  - January 05, 2013 [DATE]
RESULT:
Comparison is made to a prior study dated 08/22/2012.

[Series 1: w chest pa · 0.14mm/px · 2 of 2 slices shown]
[im 1/2]
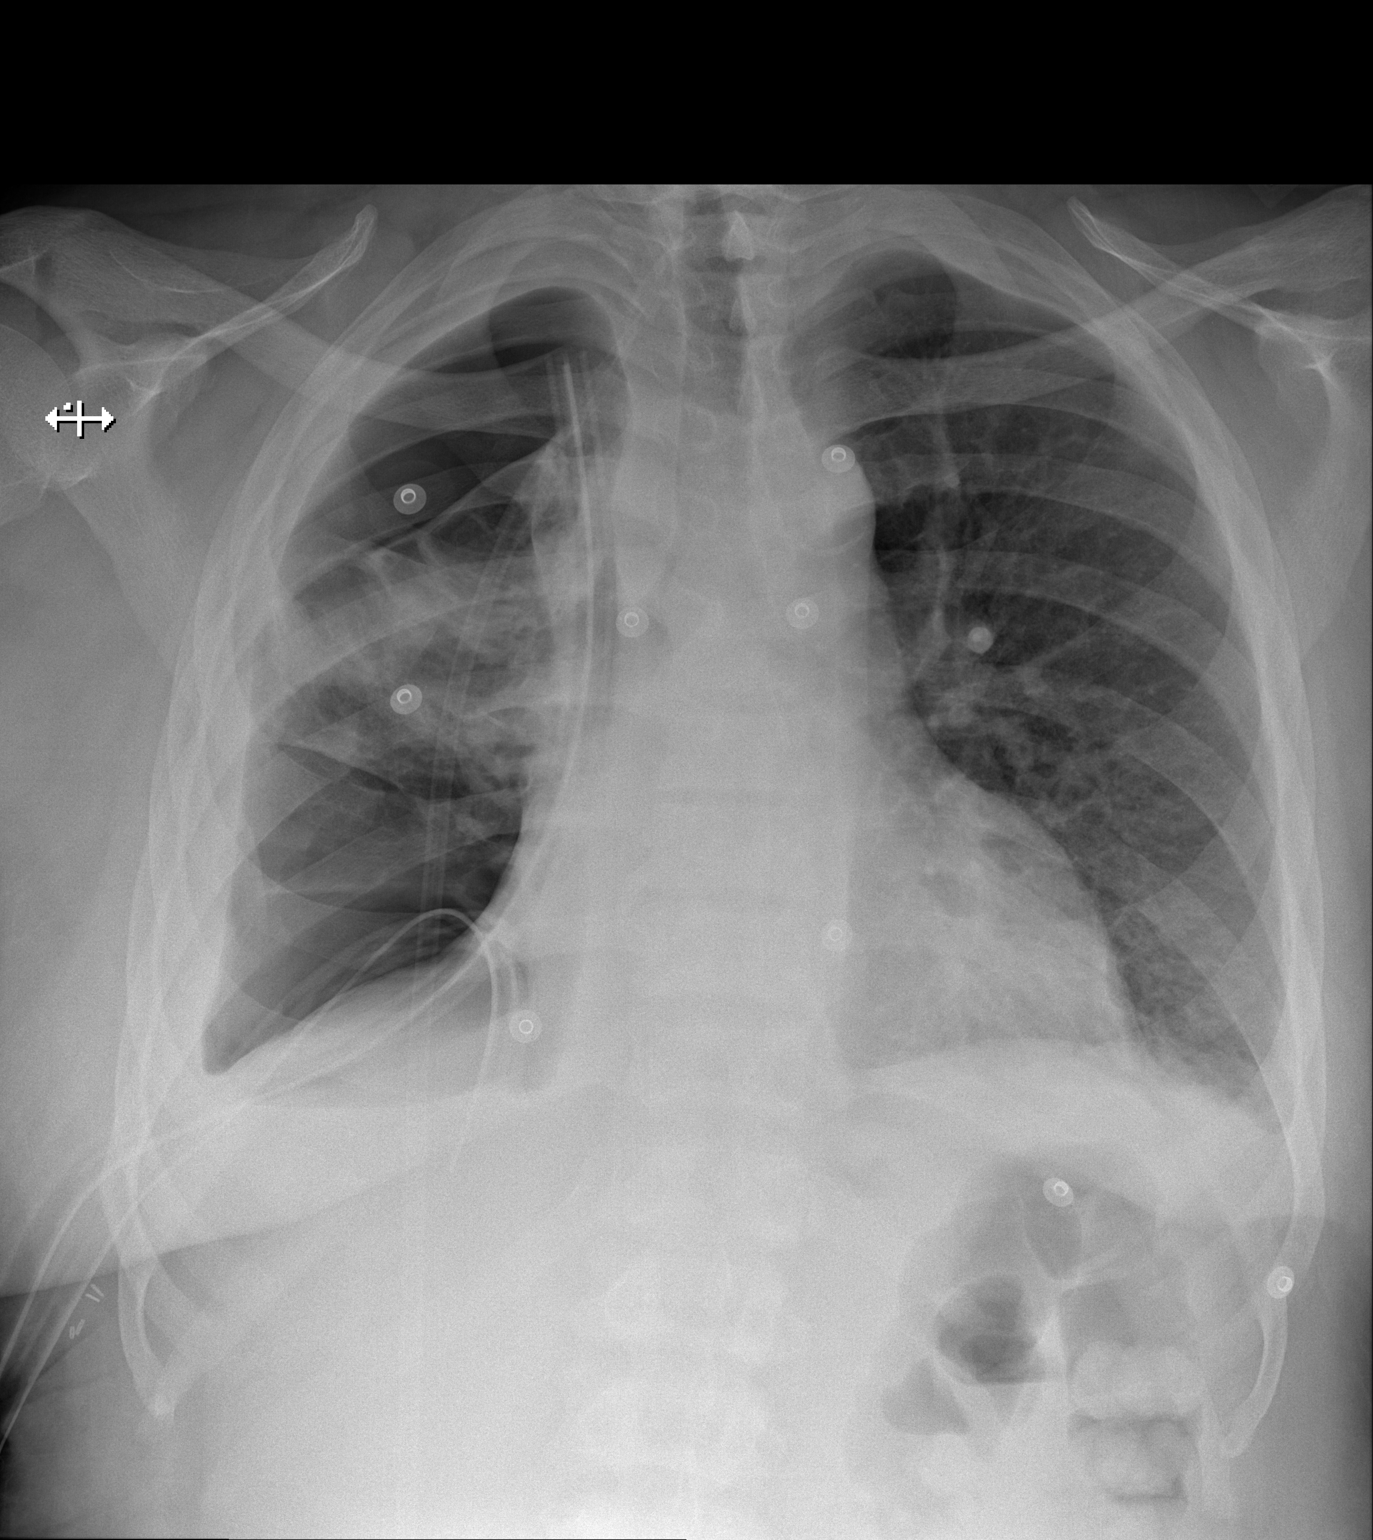
[im 2/2]
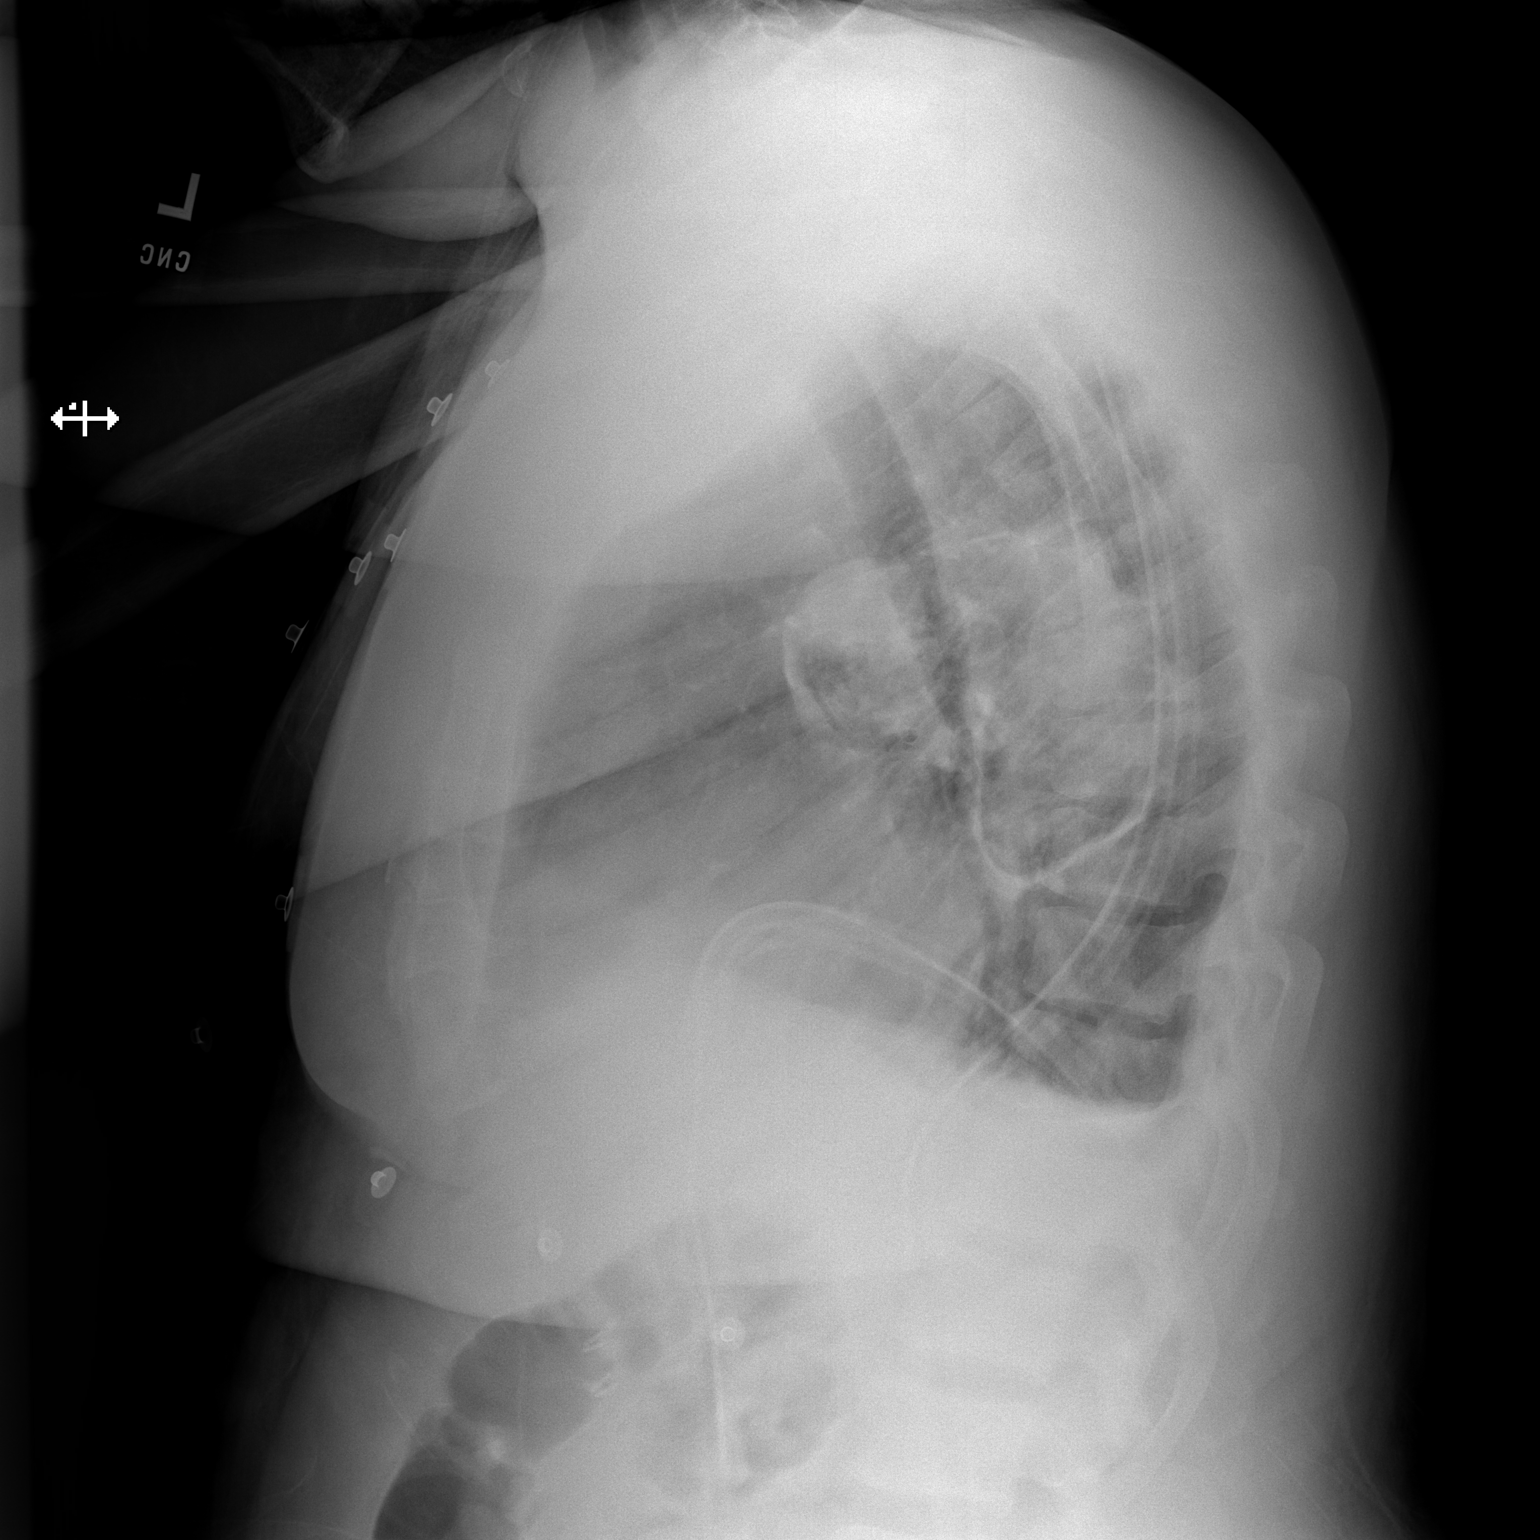

[2 of 2 positions shown; findings below may reference images not displayed]

FINDINGS: A right sided chest tube is identified within the lung apex with a
second tube in the lung base. A moderate size, right pneumothorax is
identified. A hydropneumothorax is identified within the right lung base. An
area of increased density projects within the right hilar region and
differential considerations are atelectasis versus infiltrate. A component
of collapsed lung is of diagnostic consideration. There is prominence of the
interstitial markings and left hemithorax as well as peribronchial cuffing.
The cardiac silhouette is poorly visualized. The visualized bony skeleton is
unremarkable.
IMPRESSION: 1.  Hydropneumothorax involving the right hemithorax as described above.
2.  Chest tubes as described above.
3.  Interstitial infiltrate in the left hemithorax, a component of which
likely represents pulmonary edema.

## 2014-05-15 IMAGING — CR DG CHEST 2V
2 series · 2 of 2 positions shown · non-contrast
Comparison: Prior chest x-ray 12/30/2012

CLINICAL DATA: Chest pain, chest tube

CHEST - 2 VIEW

[w chest lat]
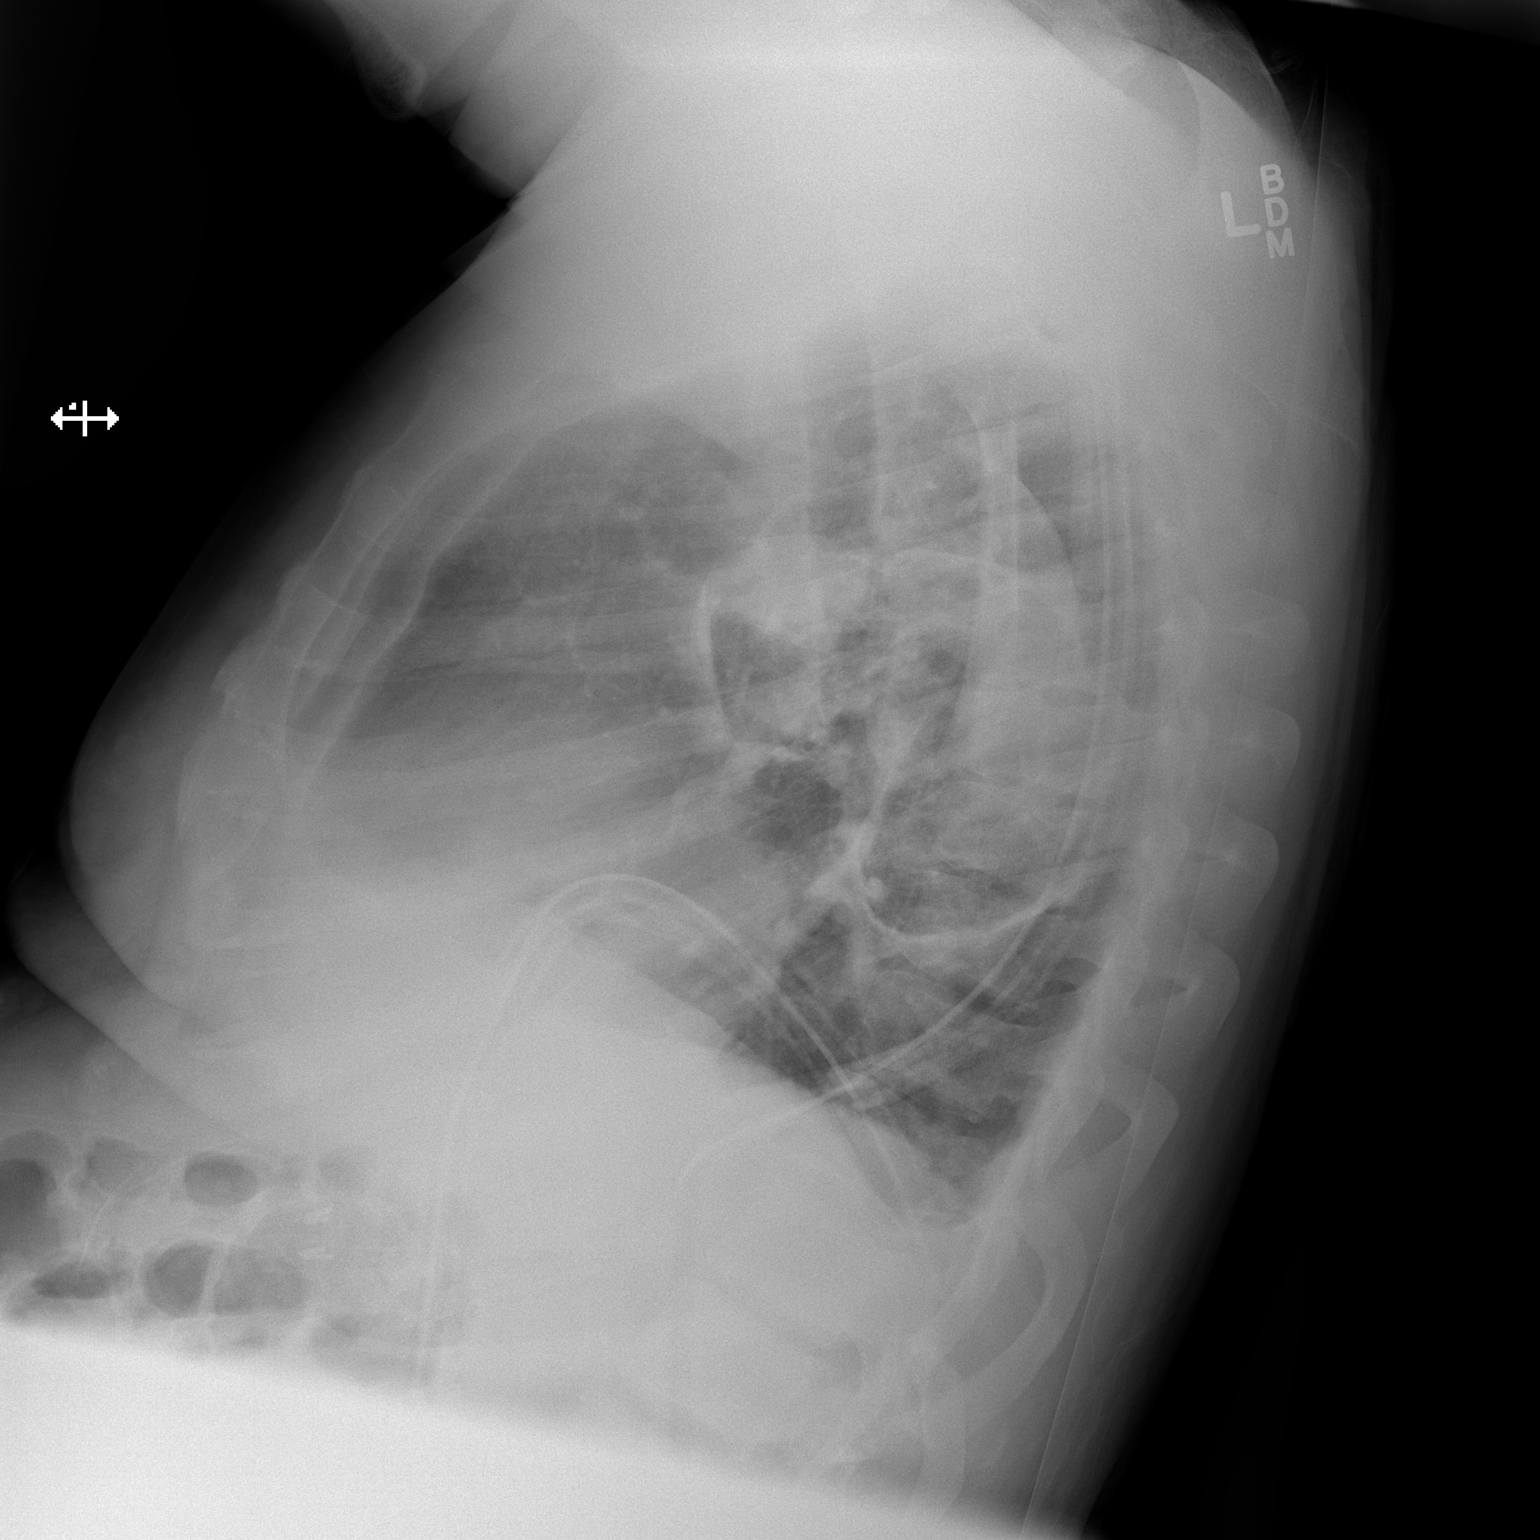

[x chest ap]
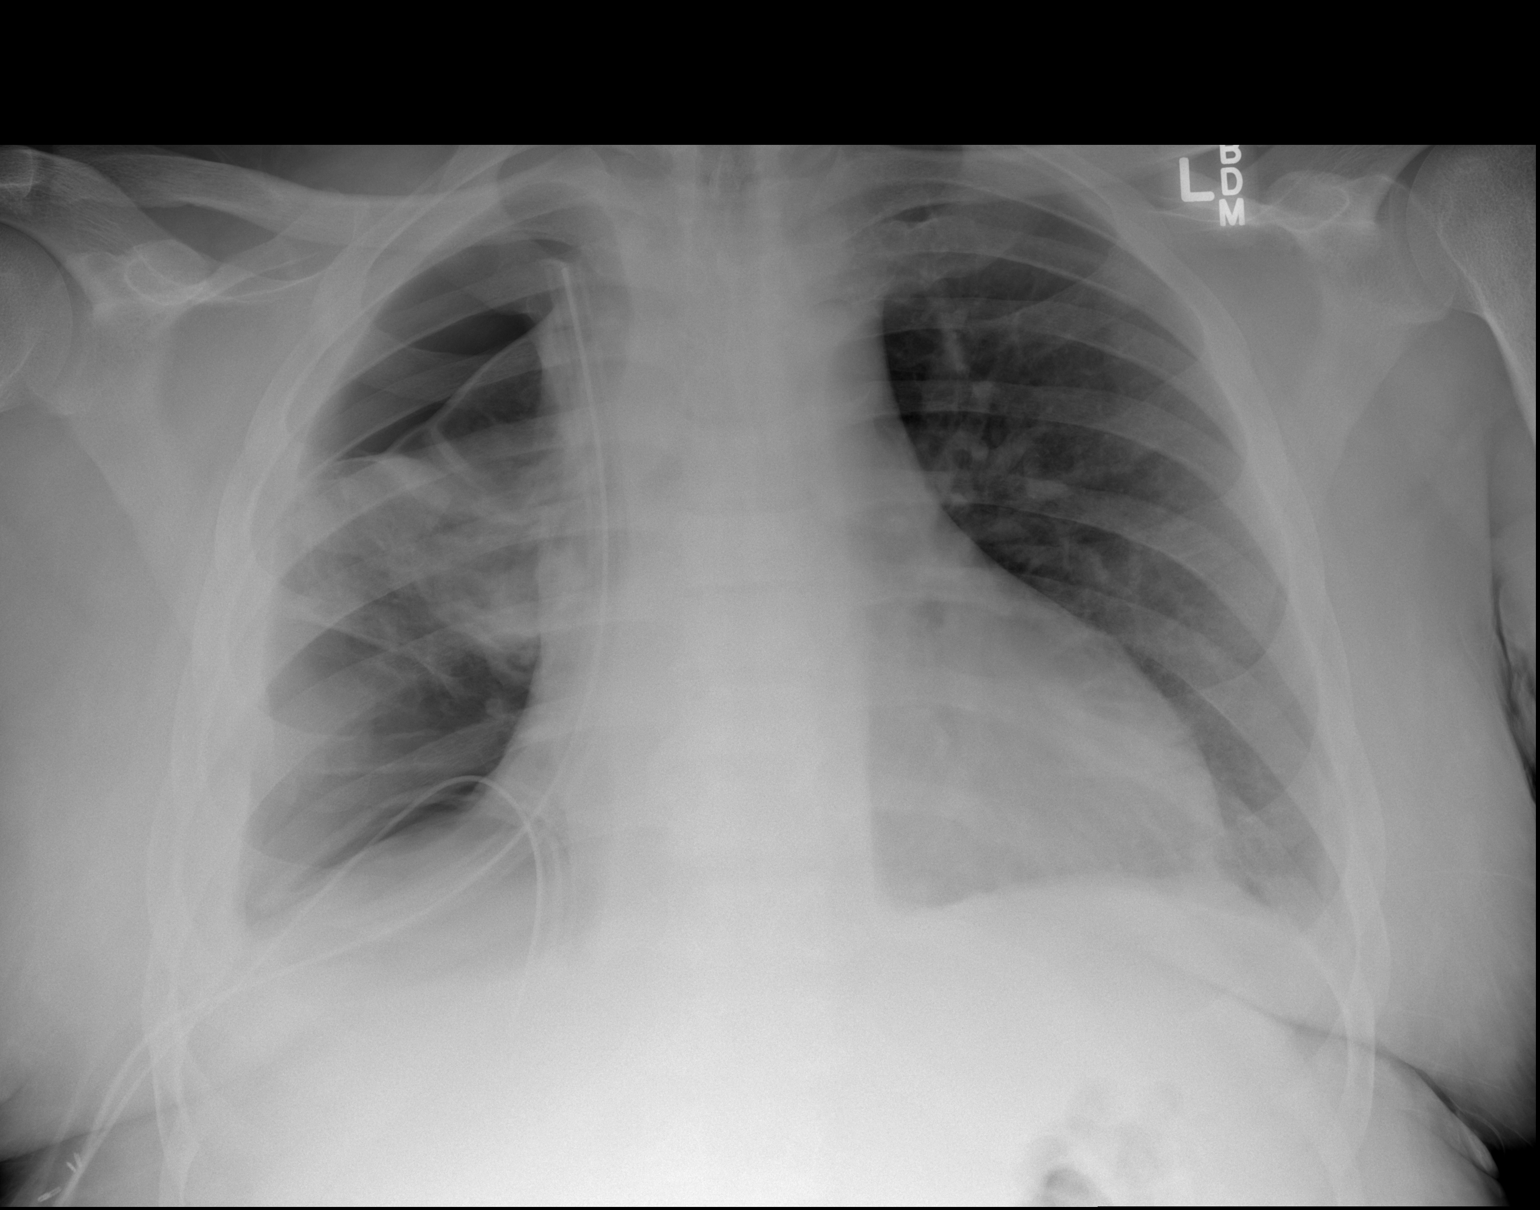

[2 of 2 positions shown; findings below may reference images not displayed]

FINDINGS: Similar position of right-sided thoracostomy tubes.  The
left IJ central venous catheter has been removed.  Unchanged
appearance of residual pleural fluid versus pleural thickening and
right apical pneumothorax.  Opacities in the right upper and mid
lung are also similar to prior.  Stable mild cardiomegaly.  The
left lung remains relatively well aerated.
IMPRESSION: 1.  Very similar appearance of the right lung compared to
12/30/2012.  Persistent right apical pneumothorax (possibly ex
vacuo with stuck lung) and residual right pleural fluid versus
pleural thickening.
2.  Stable right upper and mid lung opacities.
3.  Stable mild cardiomegaly.

## 2014-05-17 IMAGING — CT CT CHEST W/O CM
2 of 3 series · 14 of 36 positions shown, 17 images · non-contrast
Comparison: Chest CT 12/12/2012.

CLINICAL DATA: Right-sided chest pain.  Possible empyema.  Fever
and chills.

CT CHEST WITHOUT CONTRAST
TECHNIQUE: Multidetector CT imaging of the chest was performed
following the standard protocol without IV contrast.

[Series 2: routine chest 5.0 st · axial · 0.70mm/px · z∈[-281,-31]mm · 11 of 60 slices shown, 14 images]
[im 5/60  mediastinal]
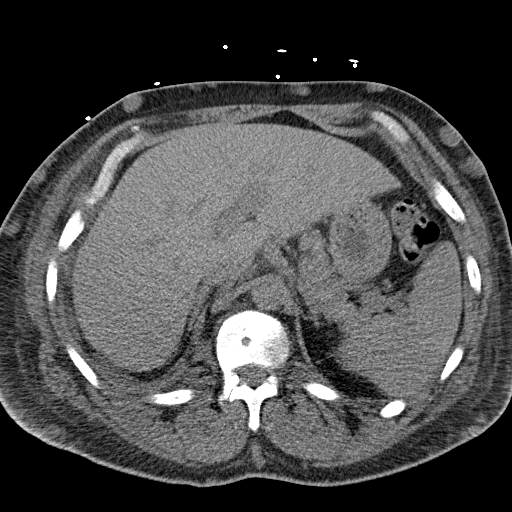
[im 5/60  lung]
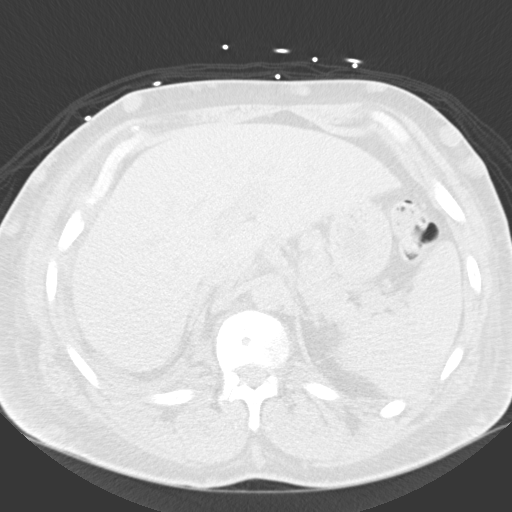
[im 9/60  lung]
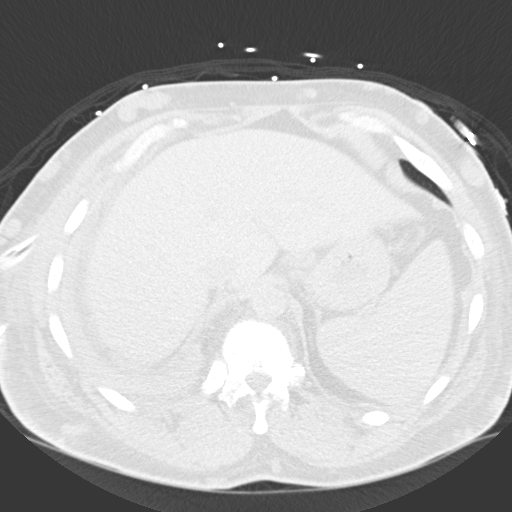
[im 14/60  lung]
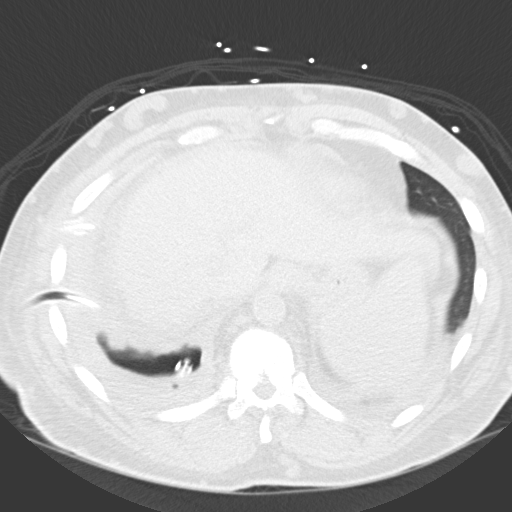
[im 20/60  lung]
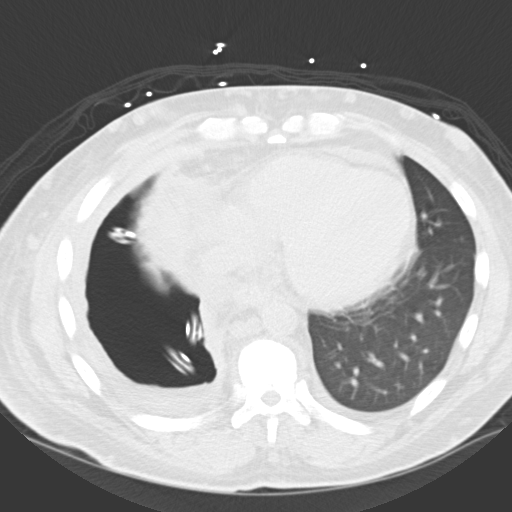
[im 25/60  mediastinal]
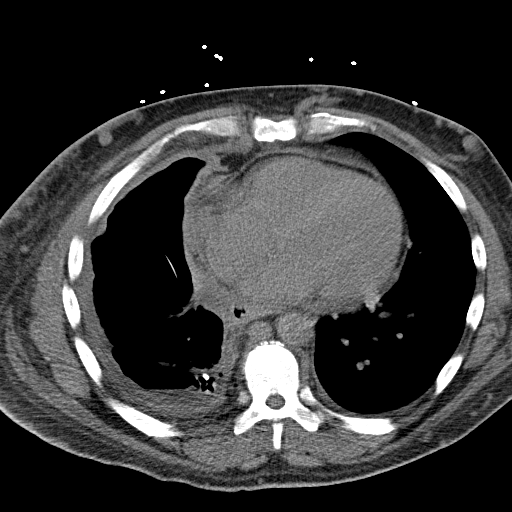
[im 25/60  lung]
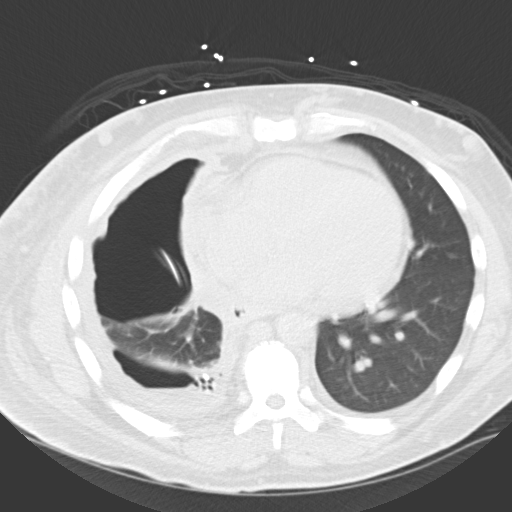
[im 31/60  lung]
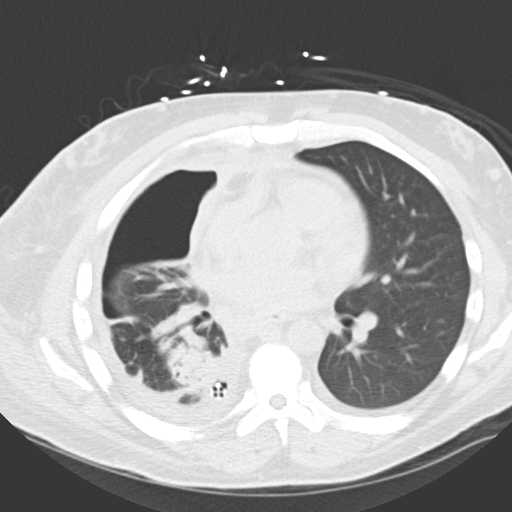
[im 35/60  lung]
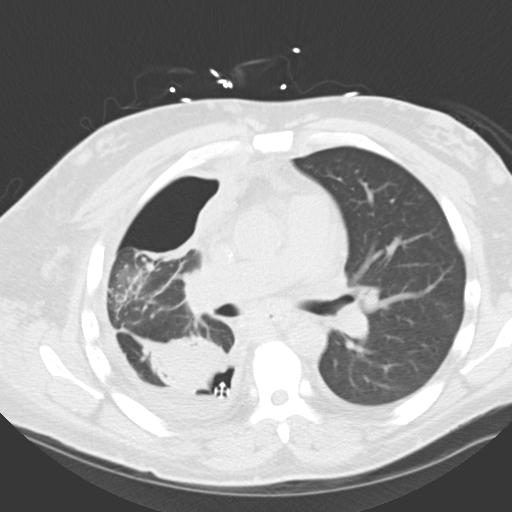
[im 40/60  lung]
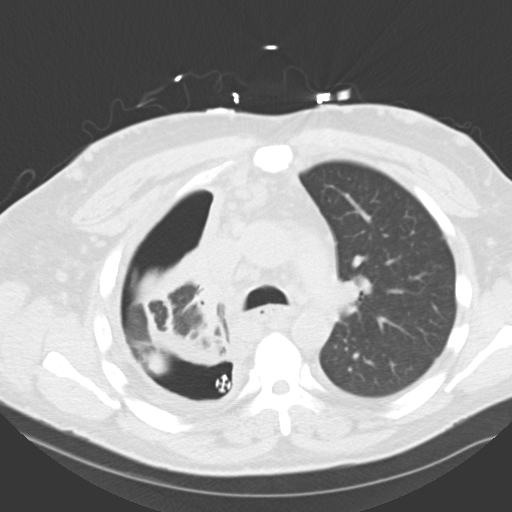
[im 46/60  mediastinal]
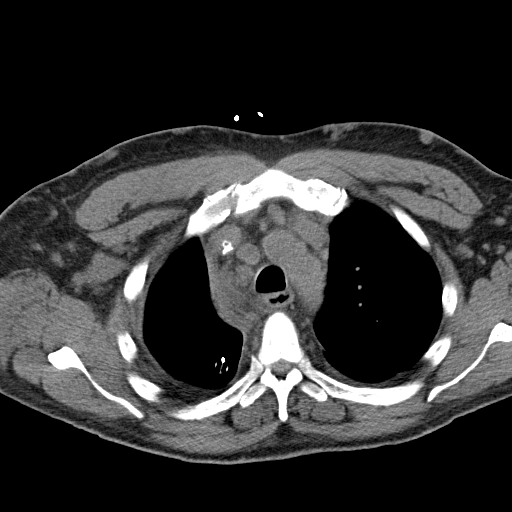
[im 46/60  lung]
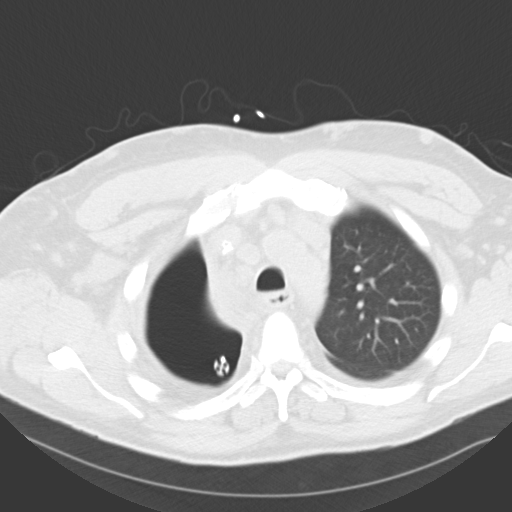
[im 51/60  lung]
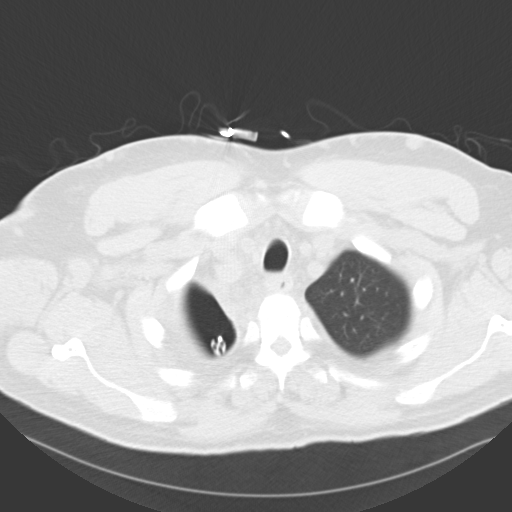
[im 55/60  lung]
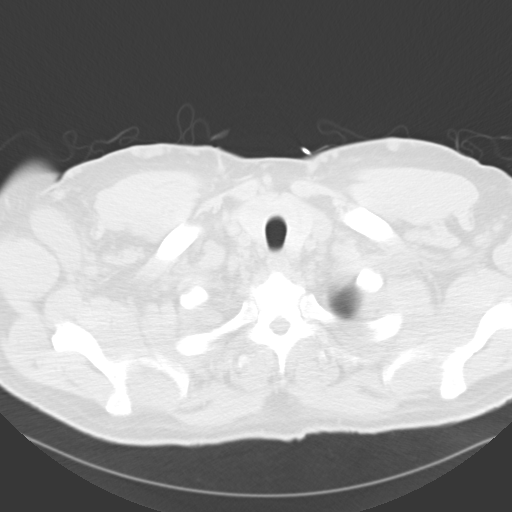

[Series 5: coronals · coronal · 0.65mm/px · 3 of 121 slices shown]
[im 25/121  lung]
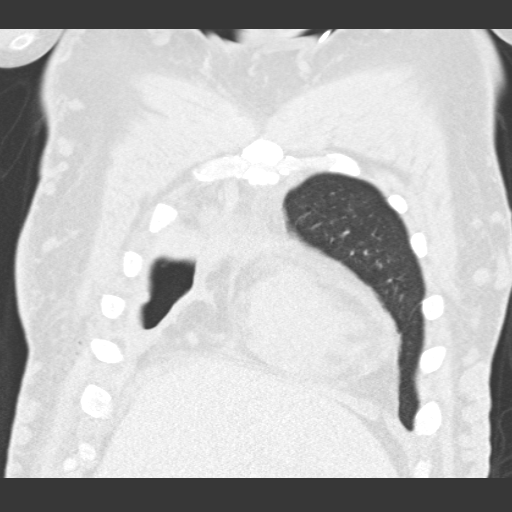
[im 49/121  lung]
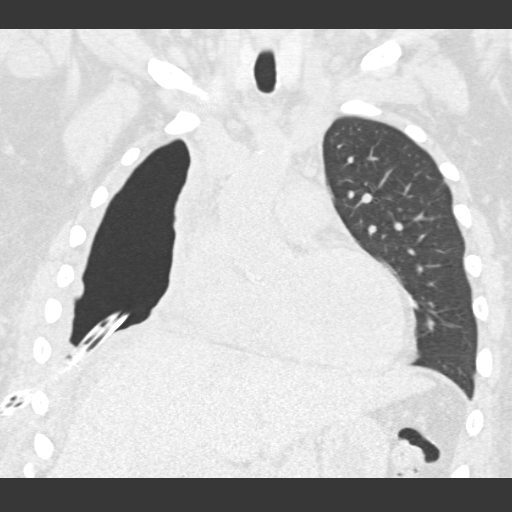
[im 73/121  lung]
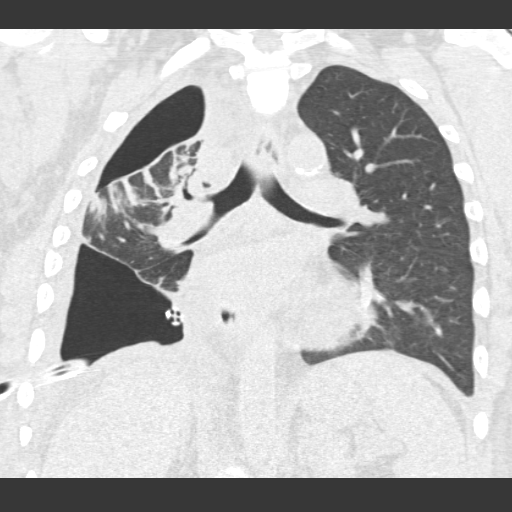

[14 of 36 positions shown; findings below may reference images not displayed]

FINDINGS: Mediastinum: Heart size is borderline enlarged. A small amount of
pericardial fluid and/or thickening, unlikely to be of hemodynamic
significance.  No associated pericardial calcification. There is
atherosclerosis of the thoracic aorta, the great vessels of the
mediastinum and the coronary arteries, including calcified
atherosclerotic plaque in the left main, left anterior descending
and right coronary arteries. Numerous borderline enlarged and
mildly enlarged mediastinal lymph nodes, largest of which is in the
superior mediastinum immediately lateral to the origin of the
innominate artery measuring 1.4 cm in short axis, slightly
increased compared to prior examinations.  Esophagus is
unremarkable in appearance. A calcified structure extending from
the distal right internal jugular vein into the proximal superior
vena cava, favored to represent a chronically calcified fibrin
sheath.  There is also some calcification more inferiorly within
the superior vena cava at the level of the superior cavoatrial
junction.  The associated with this there are innumerable venous
collaterals throughout the chest wall bilaterally, suggesting
hemodynamically significant stenosis or occlusion of the superior
vena cava.

Lungs/Pleura: Compared to the prior examination there has been
interval placement of two right-sided chest tubes, one with the tip
in the posterior right costophrenic sulcus and the other with the
tip directed superiorly into the right apex.  The previously noted
right-sided pleural effusion is nearly completely drained, although
there is some persistent gas and fluid layering dependently in the
right hemithorax.  The right lung has failed to adequately expand,
suggesting pneumothorax ex vacuo.  Again noted are multiple pleural
based mass like opacities and architectural distortion in the right
lung, favored to represent areas of rounded atelectasis.  Profound
thickening of the pleura throughout the right hemithorax is again
noted.  Trace left pleural effusion layering dependently.  Left
lung is clear.  Left lung is clear and well aerated.

Upper Abdomen: Previously mentioned chest wall collaterals extend
into the upper abdomen and are very large.

Musculoskeletal: There are no aggressive appearing lytic or blastic
lesions noted in the visualized portions of the skeleton.
IMPRESSION: 1.  Interval placement of two right-sided chest tubes with near
complete drainage of the right pleural effusion compared to the
prior study.  A small amount of residual dependent gas and fluid is
noted in the right hemithorax.
2.  The right lung has failed to adequately re-expand, most
compatible with pneumothorax ex vacuo.
3.  Extensive right-sided pleural thickening is similar to the
prior examination, and there is extensive architectural distortion
and pleural based mass like opacities in the right lung which are
favored to represent a large areas of rounded atelectasis.
4.  Calcification in this extending from the right internal jugular
vein into the superior vena cava favored to represent chronically
calcified fibrin sheath and/or chronically calcified clot.
Associated with this there are numerous venous collaterals
throughout the chest wall and upper abdomen bilaterally.  Findings
suggest a chronic occlusion or chronic high-grade stenosis of the
superior vena cava.  Clinical correlation for signs and symptoms of
the SVC syndrome is recommended.
5.  Trace left pleural effusion layering dependently is simple in
appearance.

## 2014-05-18 IMAGING — CR DG CHEST 1V PORT
1 series · 1 of 1 positions shown · non-contrast
Comparison: CT chest dated 01/08/2013

CLINICAL DATA: Status post chest tube

PORTABLE CHEST - 1 VIEW

[AP]
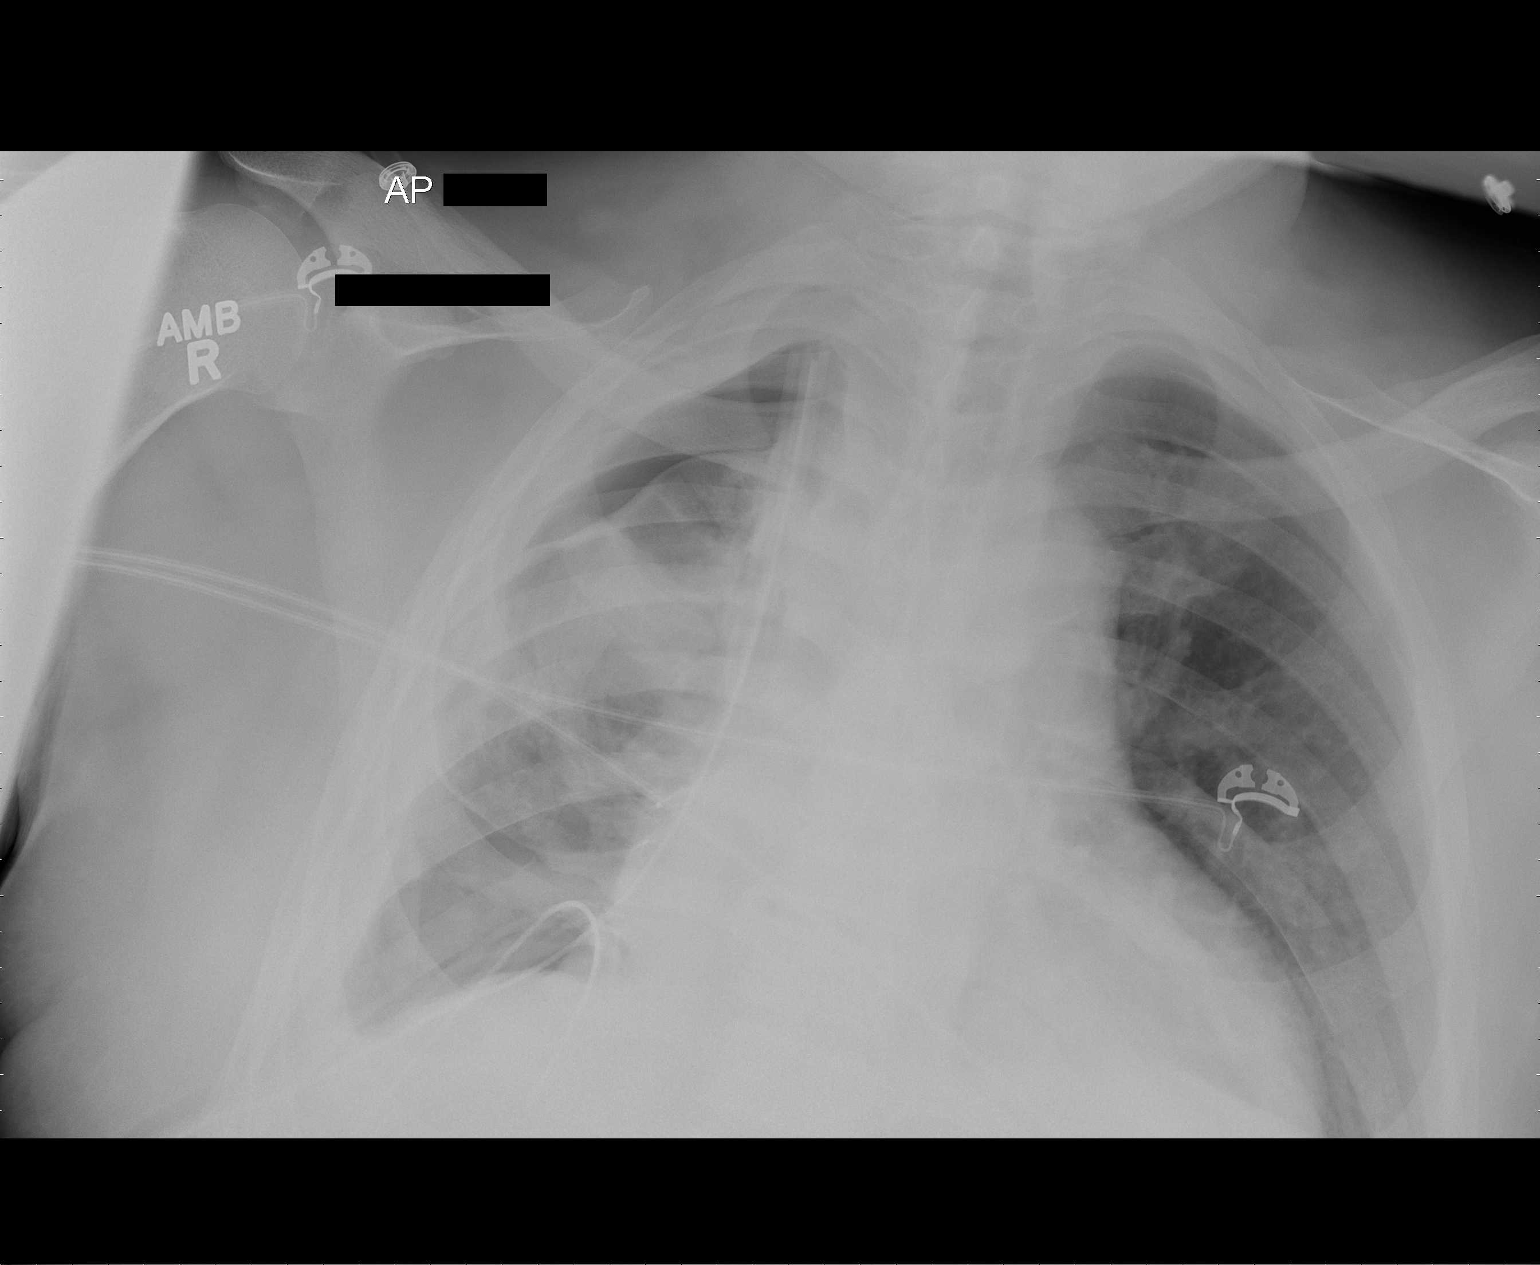

[1 of 1 positions shown; findings below may reference images not displayed]

FINDINGS: Small apical hydropneumothorax with layering fluid at the
right lung base.  This appearance is improved from prior CT.

Stable right apical and right basilar chest tubes.

Left lung essentially clear.

Cardiomegaly.
IMPRESSION: Small apical hydropneumothorax, improved.

Stable right apical and basilar chest tubes.

## 2014-05-20 IMAGING — CR DG CHEST 1V PORT
1 series · 1 of 1 positions shown · non-contrast
Comparison: the previous day's study

CLINICAL DATA: Air leak, pneumothorax

PORTABLE CHEST - 1 VIEW

[AP]
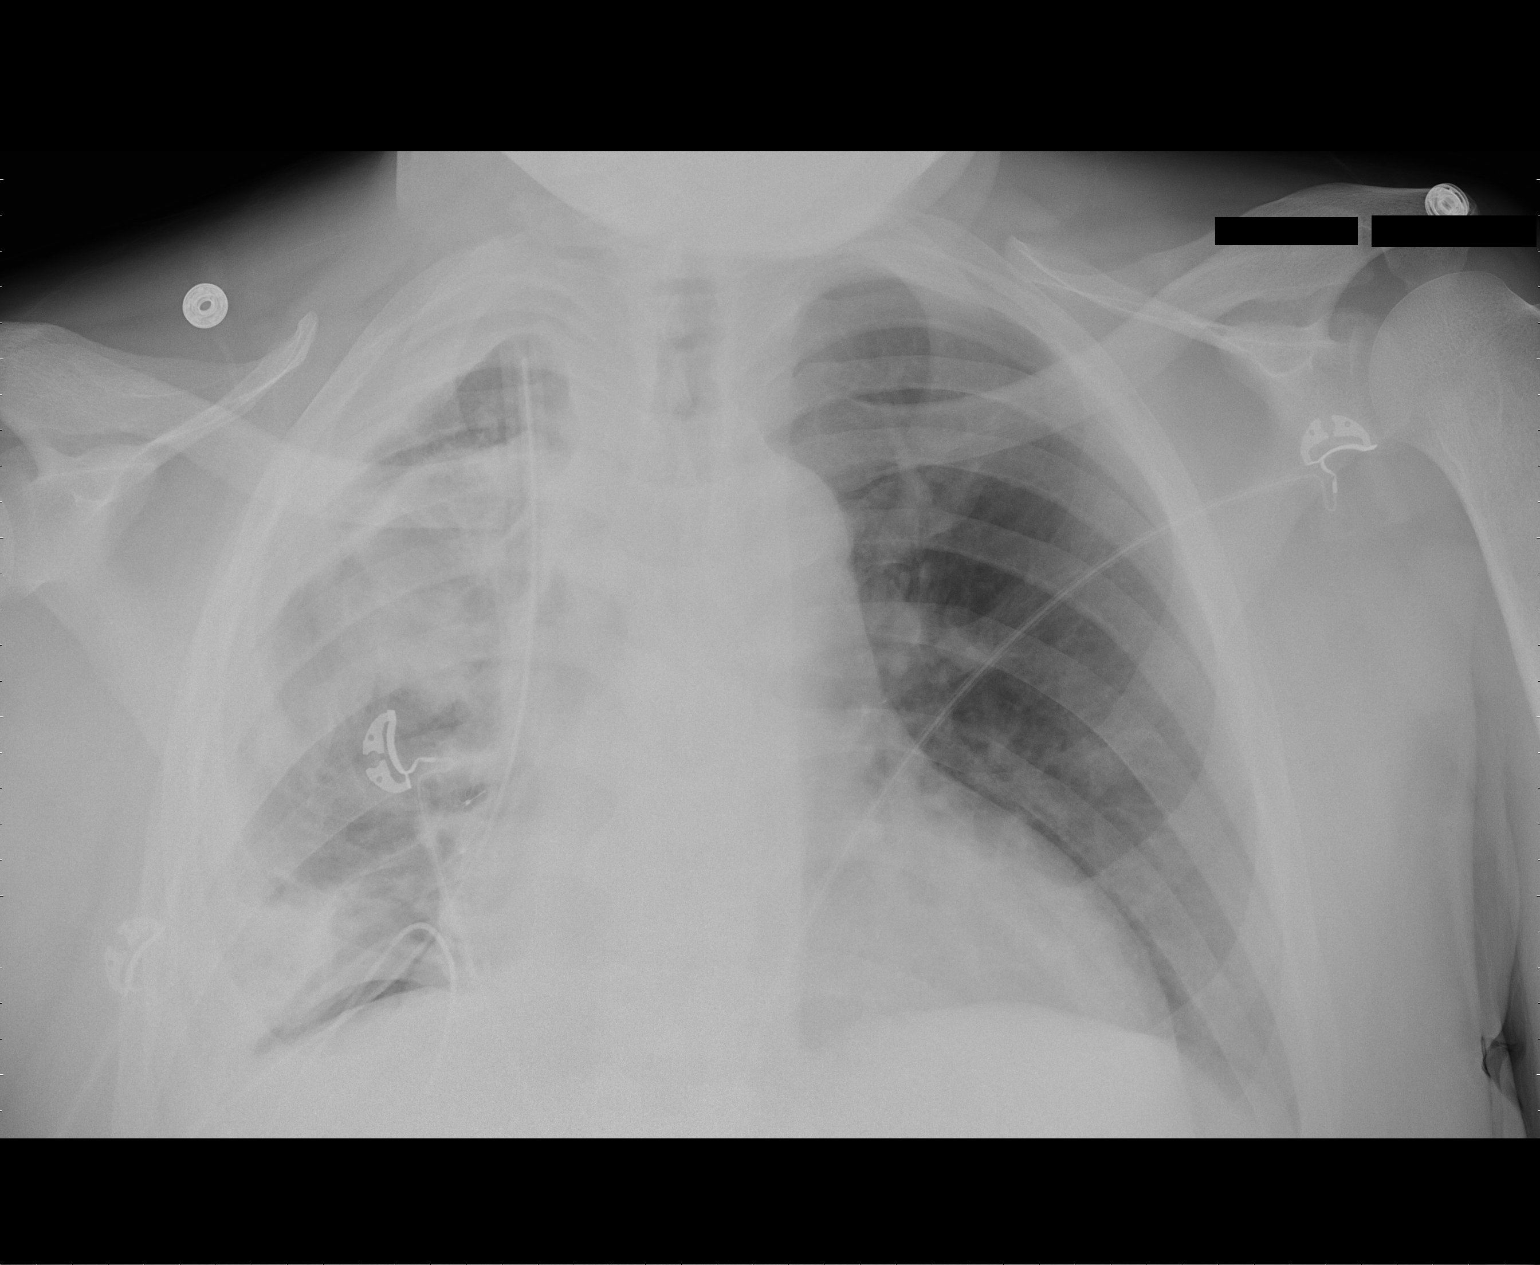

[1 of 1 positions shown; findings below may reference images not displayed]

FINDINGS: Two right chest tubes remain in place.  There is a small
basilar pneumothorax.  Atelectasis/consolidation in the right upper
lung and laterally at the right lung base as before.  Left lung is
clear.  Mild cardiomegaly stable.  There is right pleural
thickening or effusion.
IMPRESSION: 1.  Stable right chest tubes with small basilar pneumothorax

## 2014-05-21 IMAGING — CR DG CHEST 1V PORT
1 series · 1 of 1 positions shown · non-contrast
Comparison: Portable chest x-ray of 01/11/2013

CLINICAL DATA: The right pneumothorax, follow-up

PORTABLE CHEST - 1 VIEW

[AP]
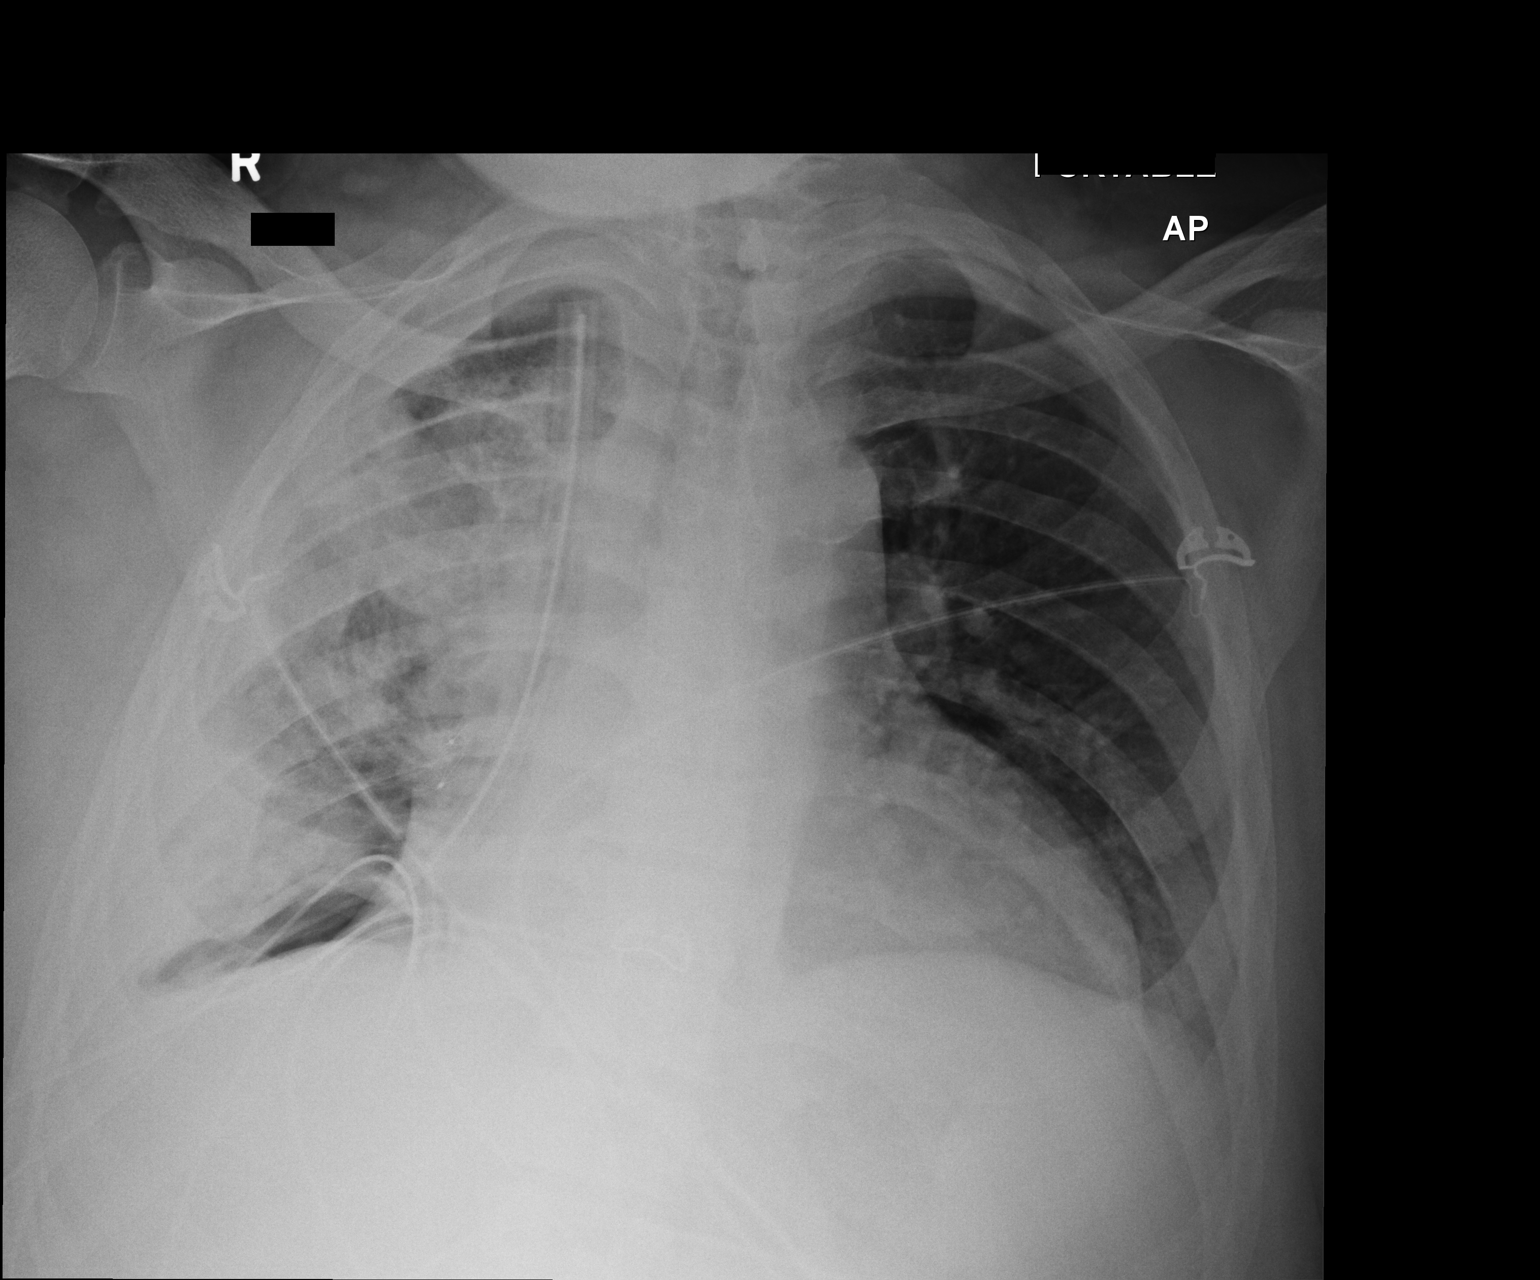

[1 of 1 positions shown; findings below may reference images not displayed]

FINDINGS: A right basilar pneumothorax remains with right chest
tubes unchanged in position.  Pleural and parenchymal opacity
throughout the right hemithorax is stable.  The left lung is clear.
Heart size is stable.
IMPRESSION: 1.  No change in the right pneumothorax with two right chest tubes.
2.  No change in pleural and parenchymal opacity throughout the
right hemithorax.

## 2014-05-22 IMAGING — CR DG CHEST 1V PORT
1 series · 1 of 1 positions shown · non-contrast
Comparison: Portable chest x-ray of 01/12/2039

CLINICAL DATA: Evaluate chest tube

PORTABLE CHEST - 1 VIEW

[AP]
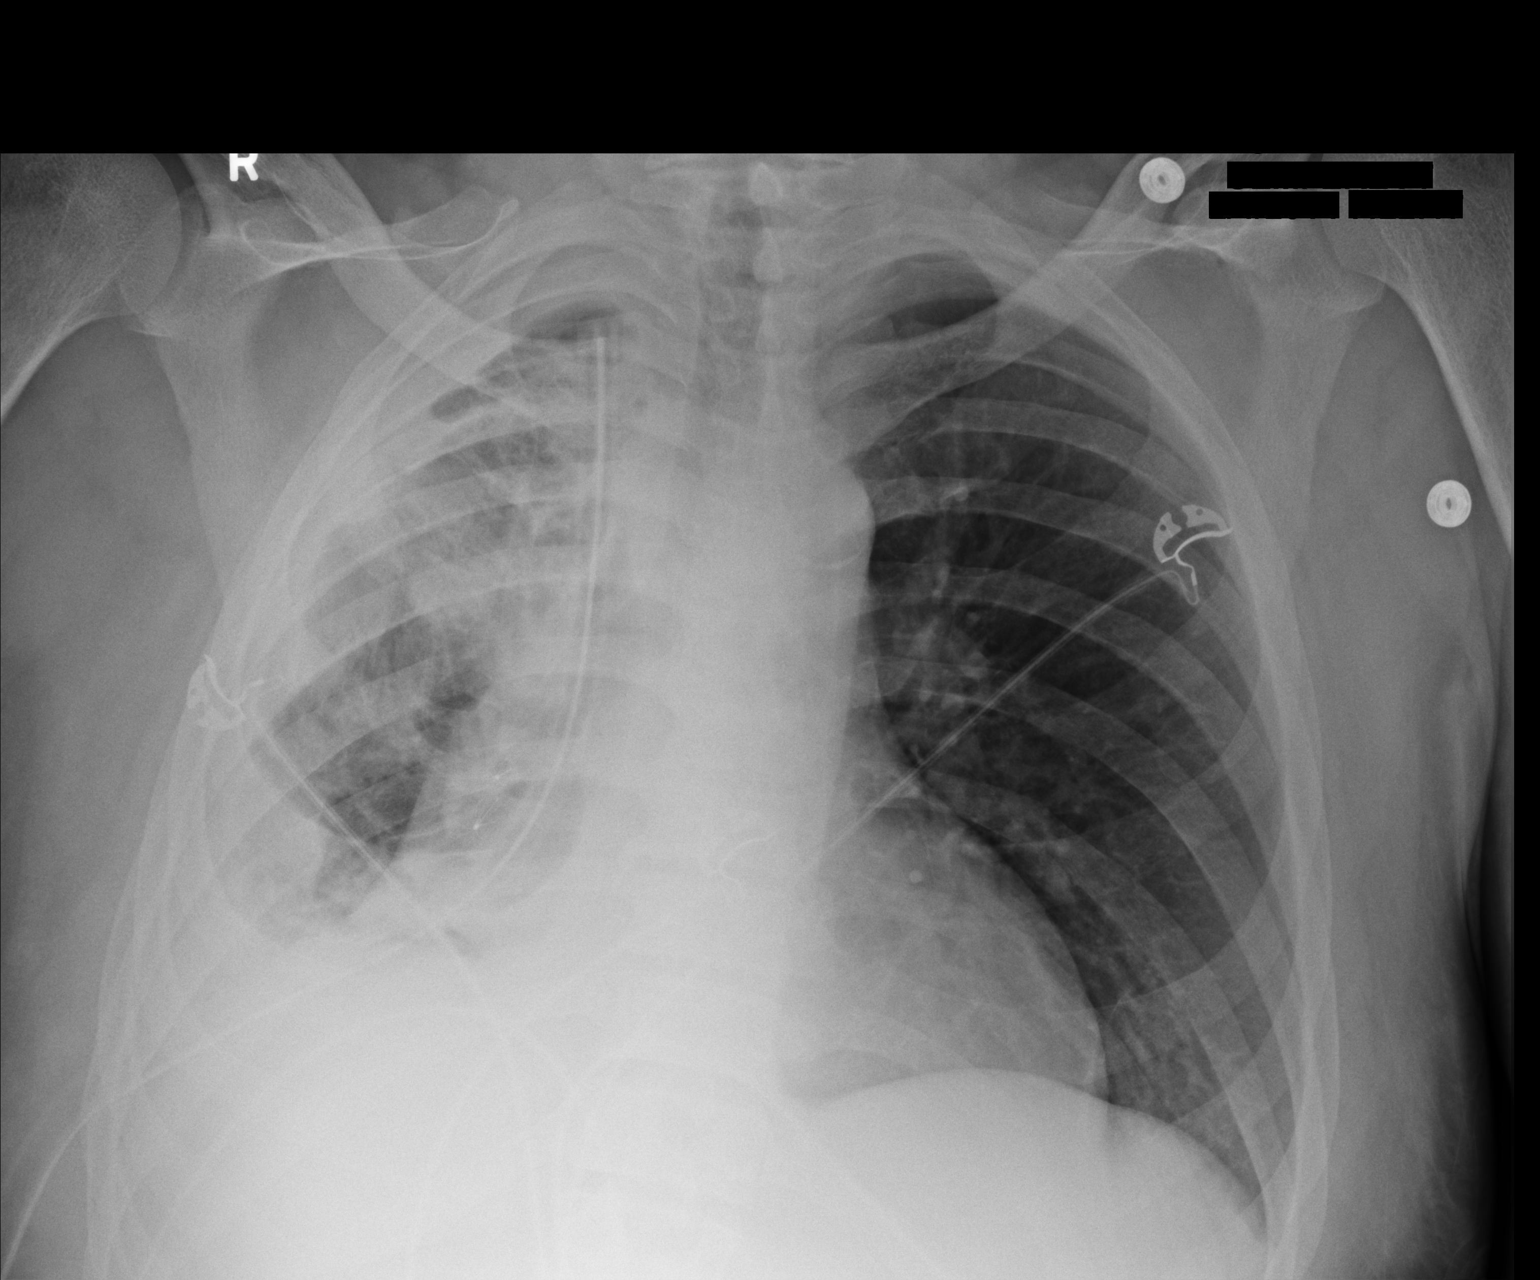

[1 of 1 positions shown; findings below may reference images not displayed]

FINDINGS: The right basilar pneumothorax noted previously is not
definitely seen.  There is a small amount of pleural air overlying
the right lung apex and a right chest tube remains.  Right apical
pleural thickening is stable as is volume loss throughout the right
hemithorax.  The left lung is clear.
IMPRESSION: A small amount of pleural air remains in the right lung apex with a
right chest tube remaining.  No definite basilar pneumothorax is
currently seen.

## 2014-05-24 IMAGING — CR DG CHEST 1V PORT
1 series · 1 of 1 positions shown · non-contrast
Comparison: 01/13/2013

CLINICAL DATA: Follow up pneumothorax

PORTABLE CHEST - 1 VIEW

[AP]
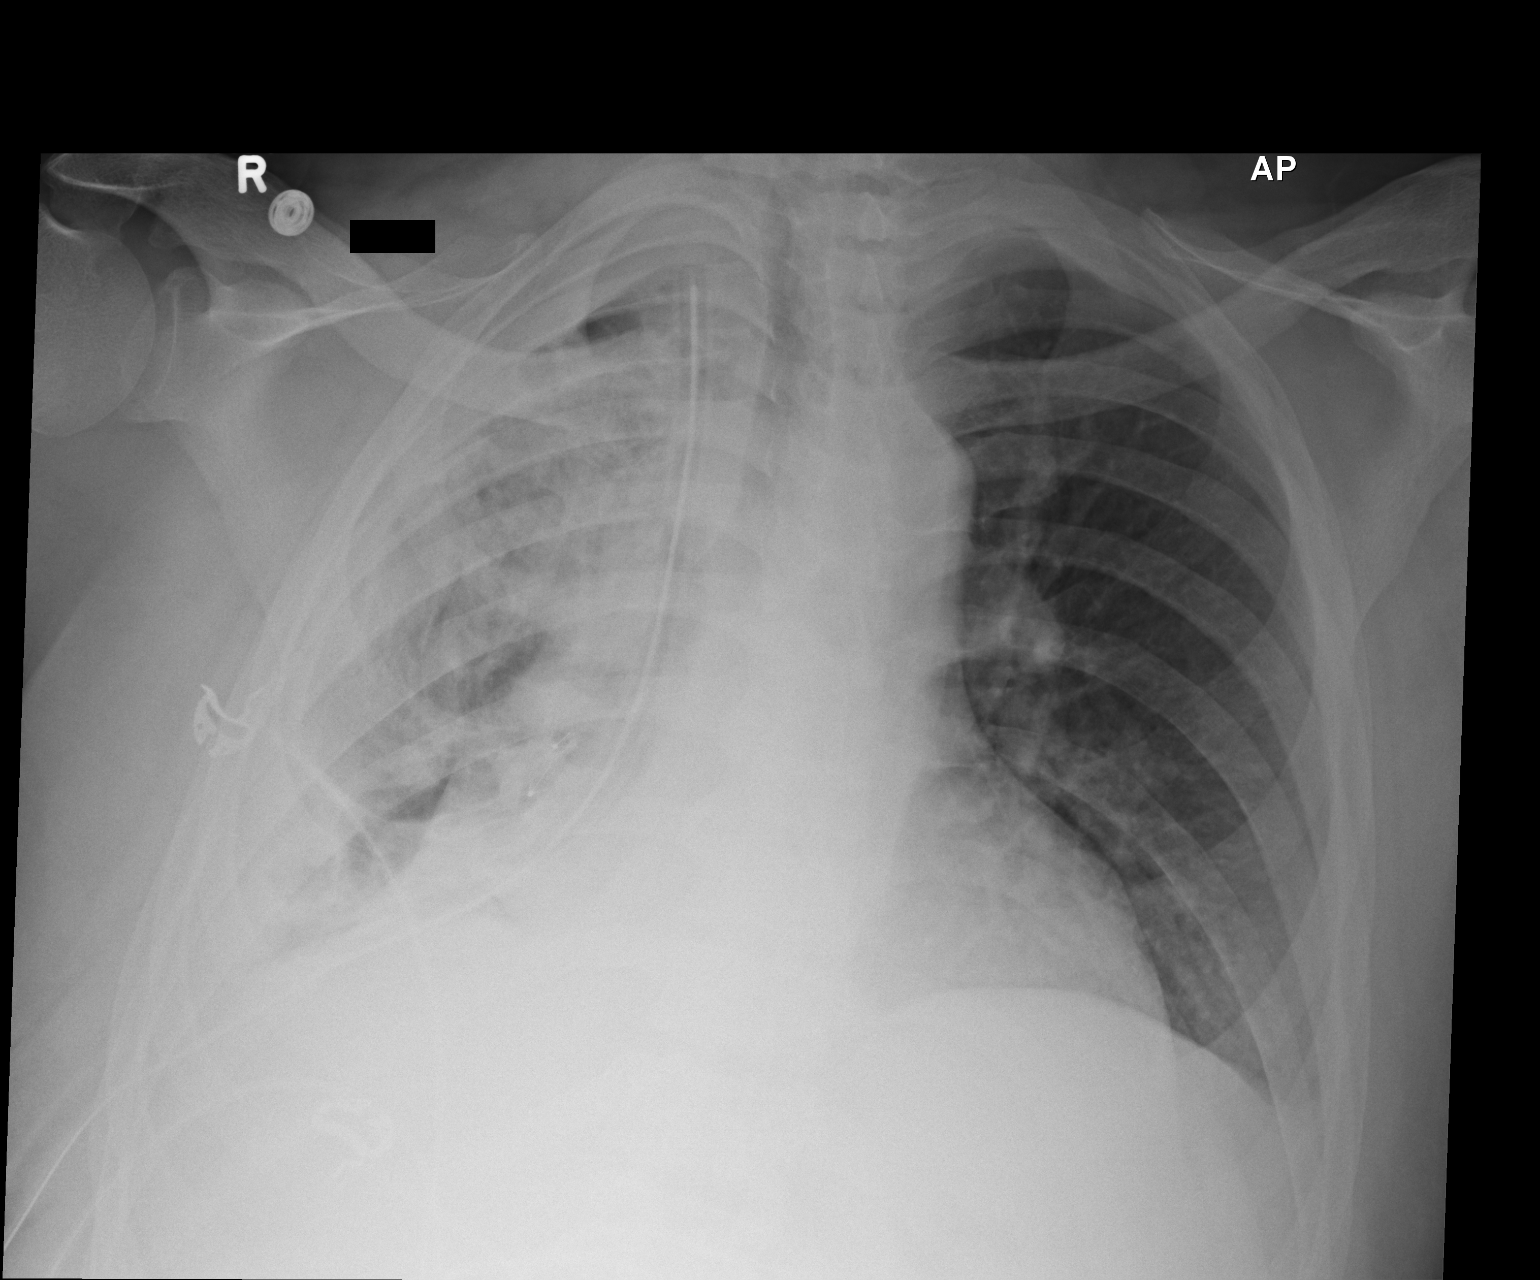

[1 of 1 positions shown; findings below may reference images not displayed]

FINDINGS: The right-sided chest tube remains in satisfactory
position.  The degree of aeration in the right lung is stable.  No
definitive pneumothorax is seen.  Left lung remains clear.  Cardiac
shadow is stable.
IMPRESSION: Stable changes on the right.  No pneumothorax is seen.

## 2014-05-26 IMAGING — CR DG CHEST 1V PORT
1 series · 1 of 1 positions shown · non-contrast
Comparison: Prior chest x-ray 01/16/2013

CLINICAL DATA: Chest tube, pneumothorax

PORTABLE CHEST - 1 VIEW

[AP]
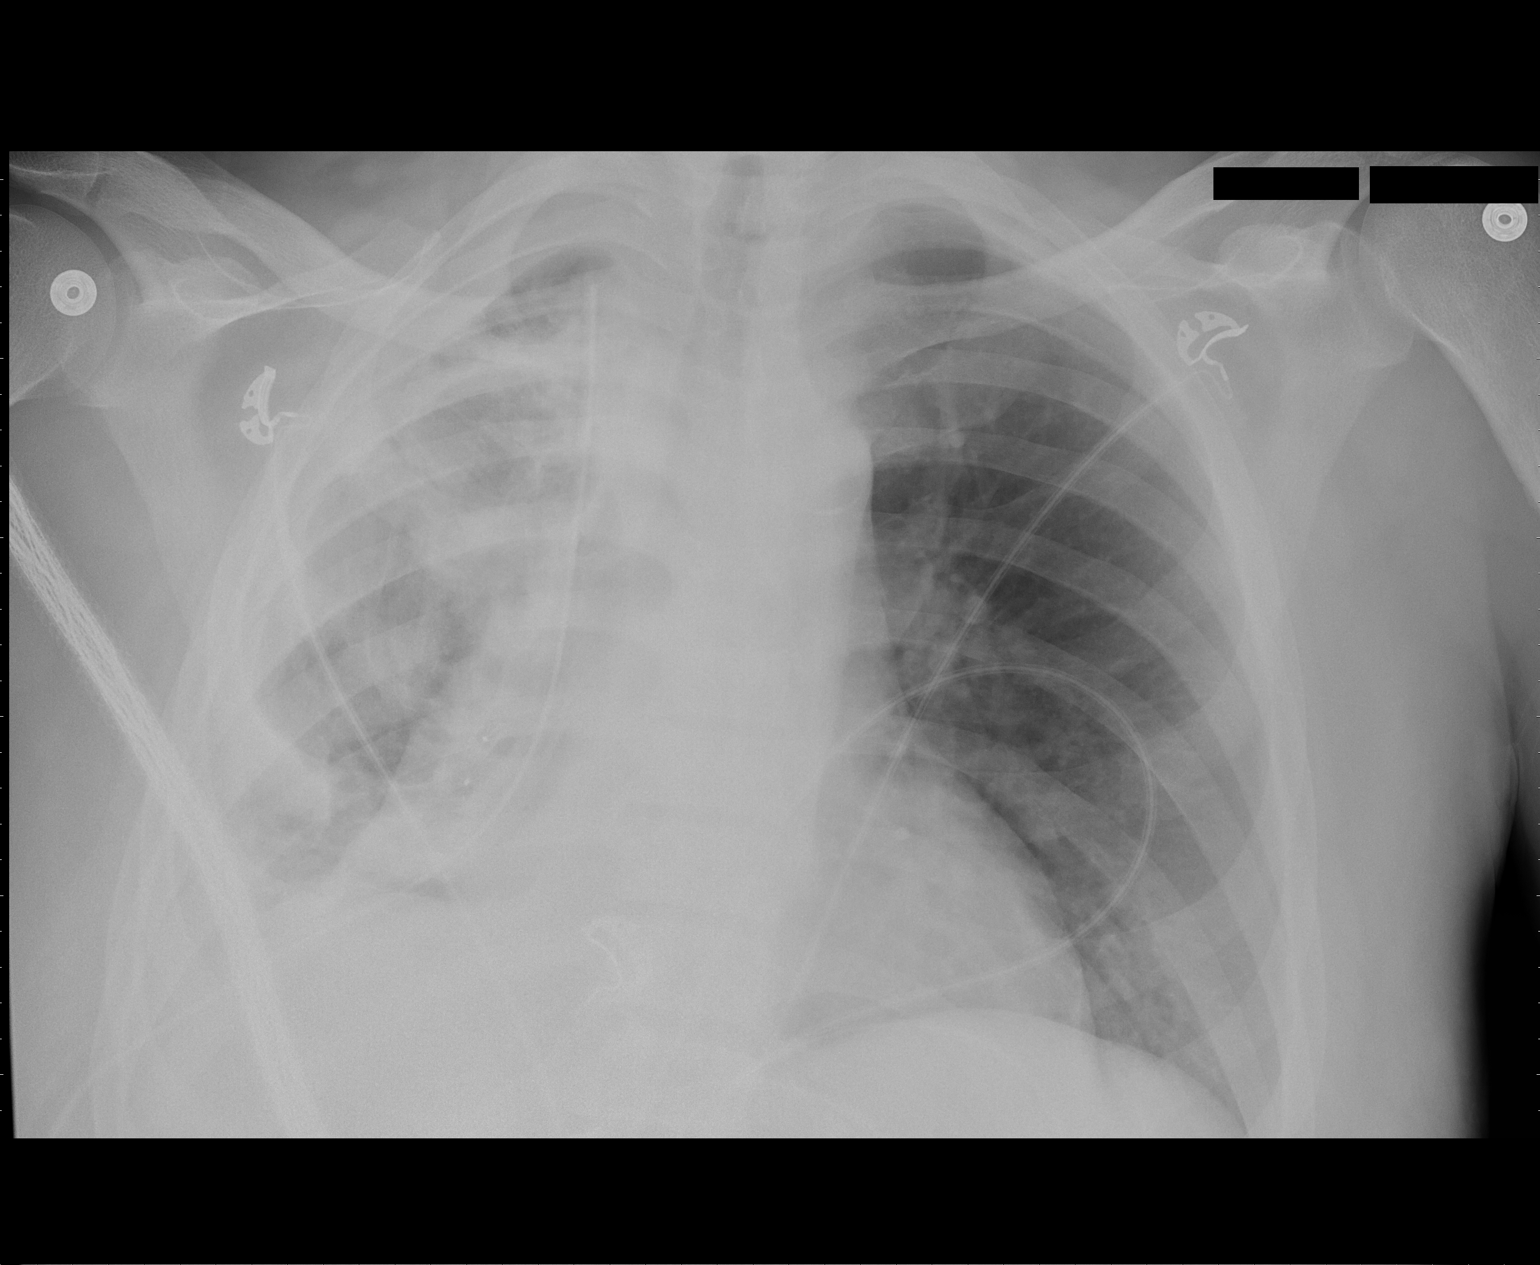

[1 of 1 positions shown; findings below may reference images not displayed]

FINDINGS: Stable position of right-sided thoracostomy tube.
Persistent right pleural effusion with decreased pneumothorax
component.  Only trace residual lucency at the right lung base.
There is persistent dense streaky opacity throughout the right
lung.  The left lung remains well aerated.  Stable enlargement of
the cardiopericardial silhouette.
IMPRESSION: 1.  Decreasing pneumothorax component of the right
hydropneumothorax.  The amount of pleural fluid is similar to
slightly increased.
2.  Persistent streaky opacities throughout the right lung likely
reflecting scarring versus atelectasis.
3.  Stable position of right thoracostomy tube.

## 2014-05-27 IMAGING — CR DG CHEST 1V PORT
1 series · 1 of 1 positions shown · non-contrast
Comparison: Prior chest x-ray 01/17/2013

CLINICAL DATA: Follow right pneumothorax

PORTABLE CHEST - 1 VIEW

[AP]
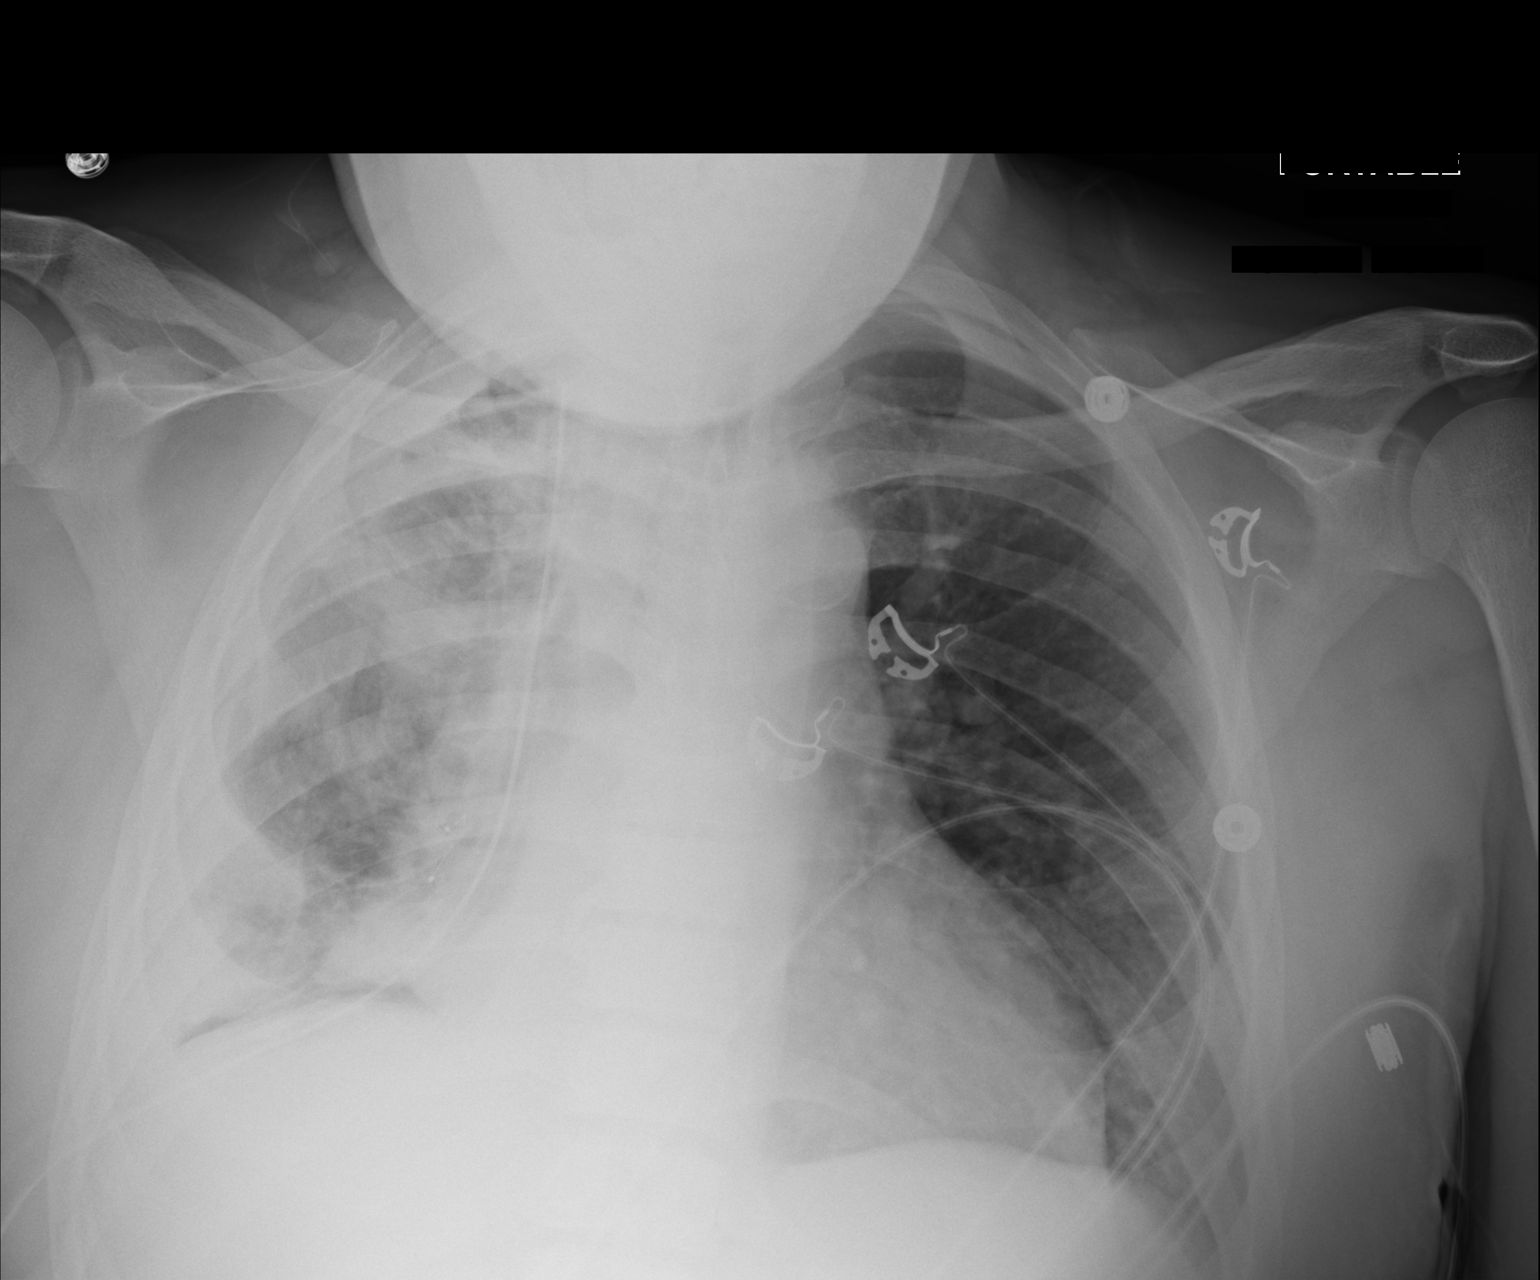

[1 of 1 positions shown; findings below may reference images not displayed]

FINDINGS: Stable position of right-sided thoracostomy tube.  Trace
residual right basilar pneumothorax.  Similar degree of residual
pleural fluid versus pleural thickening at the apex and laterally.
Diffuse linear opacities throughout the right lung consistent with
areas of scarring and chronic/rounded atelectasis are similar to
prior.  Similar enlargement of the cardiopericardial silhouette.
Background pulmonary vascular congestion is unchanged.
IMPRESSION: No significant interval change in the appearance of the chest.
Stable trace right basilar pneumothorax.

## 2014-05-28 IMAGING — CR DG CHEST 1V PORT
1 series · 1 of 1 positions shown · non-contrast
Comparison: 01/18/2013.

CLINICAL DATA: Post decortication.

PORTABLE CHEST - 1 VIEW

[AP]
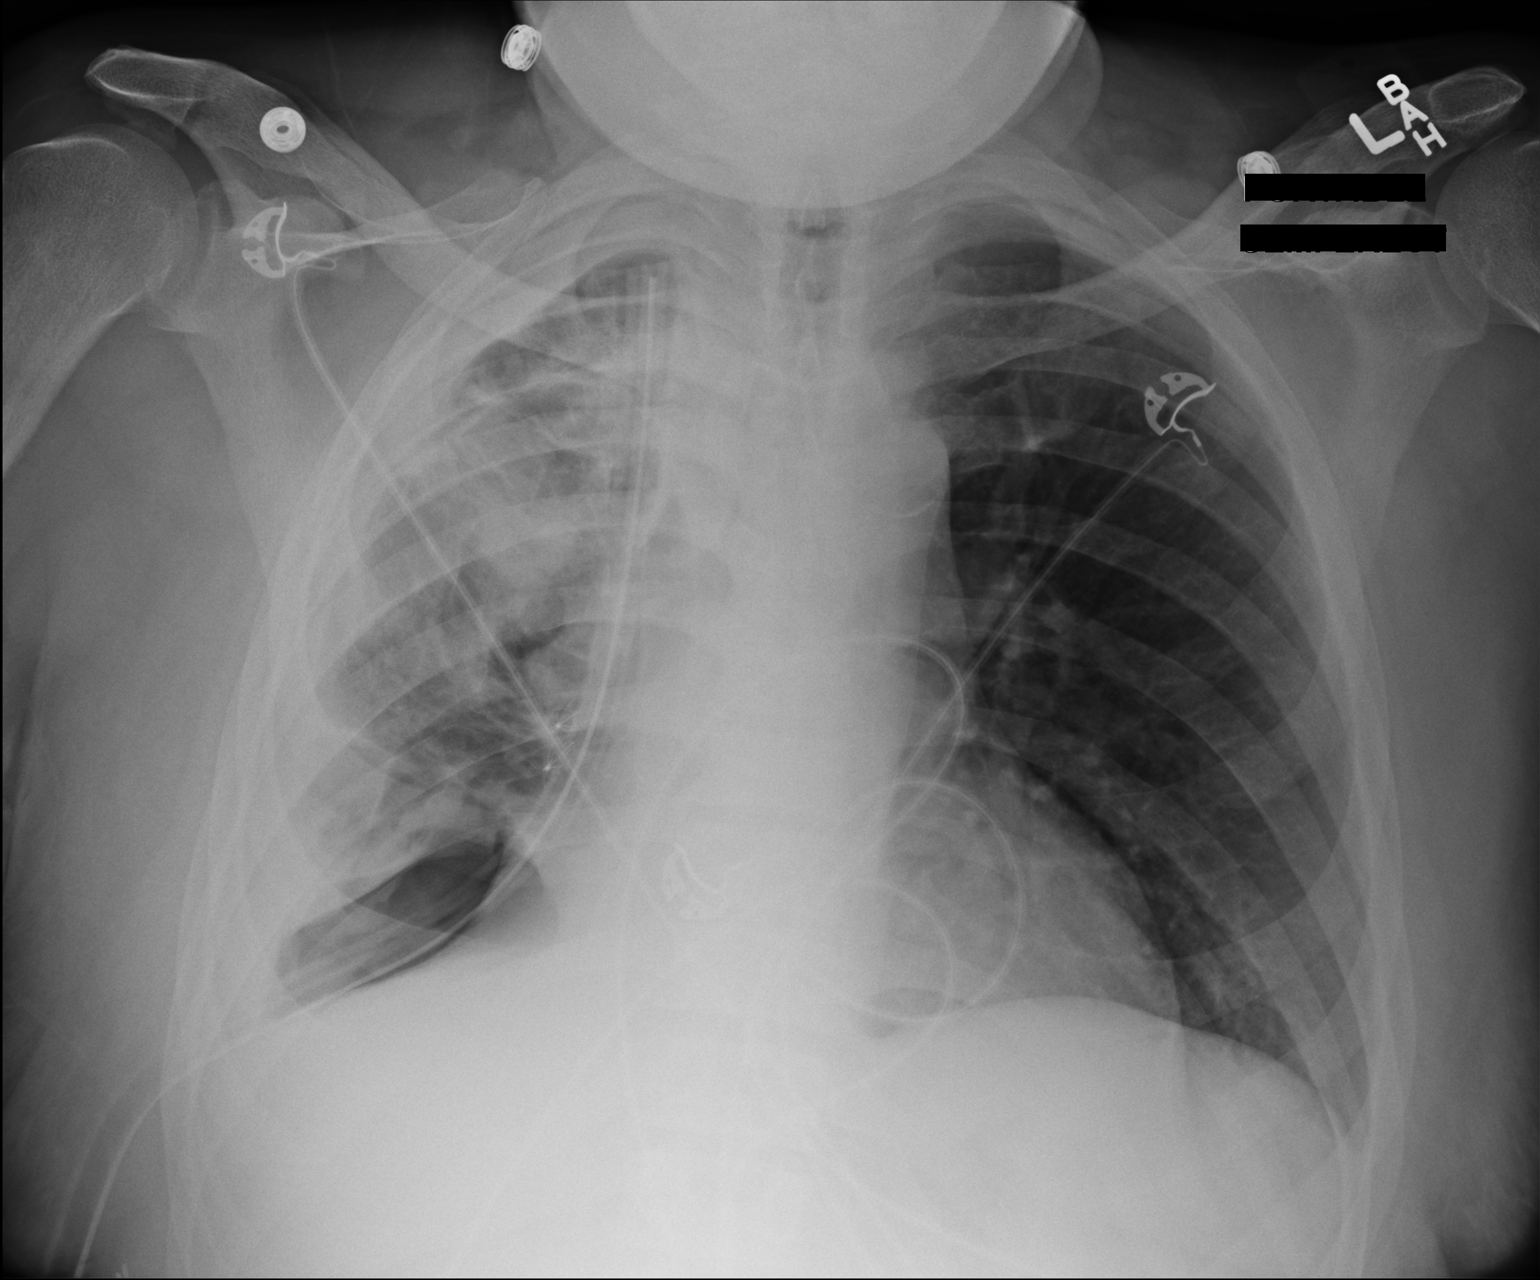

[1 of 1 positions shown; findings below may reference images not displayed]

FINDINGS: Increase in size of right base pneumothorax.  This may
represent a hydropneumothorax.  Chest tube remains in place.
Pleural thickening throughout remainder of right thorax.
Postsurgical changes.  Patchy consolidation right lung.  Pulmonary
vascular prominence.

No evidence of left-sided pneumothorax.

Heart size difficult to adequately assessed.

Calcified aorta.
IMPRESSION: Increase in size of inferiorly located right-sided
pneumothorax/hydropneumothorax.

This is a call report.

## 2014-05-29 IMAGING — CR DG CHEST 1V PORT
1 series · 1 of 1 positions shown · non-contrast
Comparison: 01/19/2013.

CLINICAL DATA: Post right decortication.

PORTABLE CHEST - 1 VIEW

[AP]
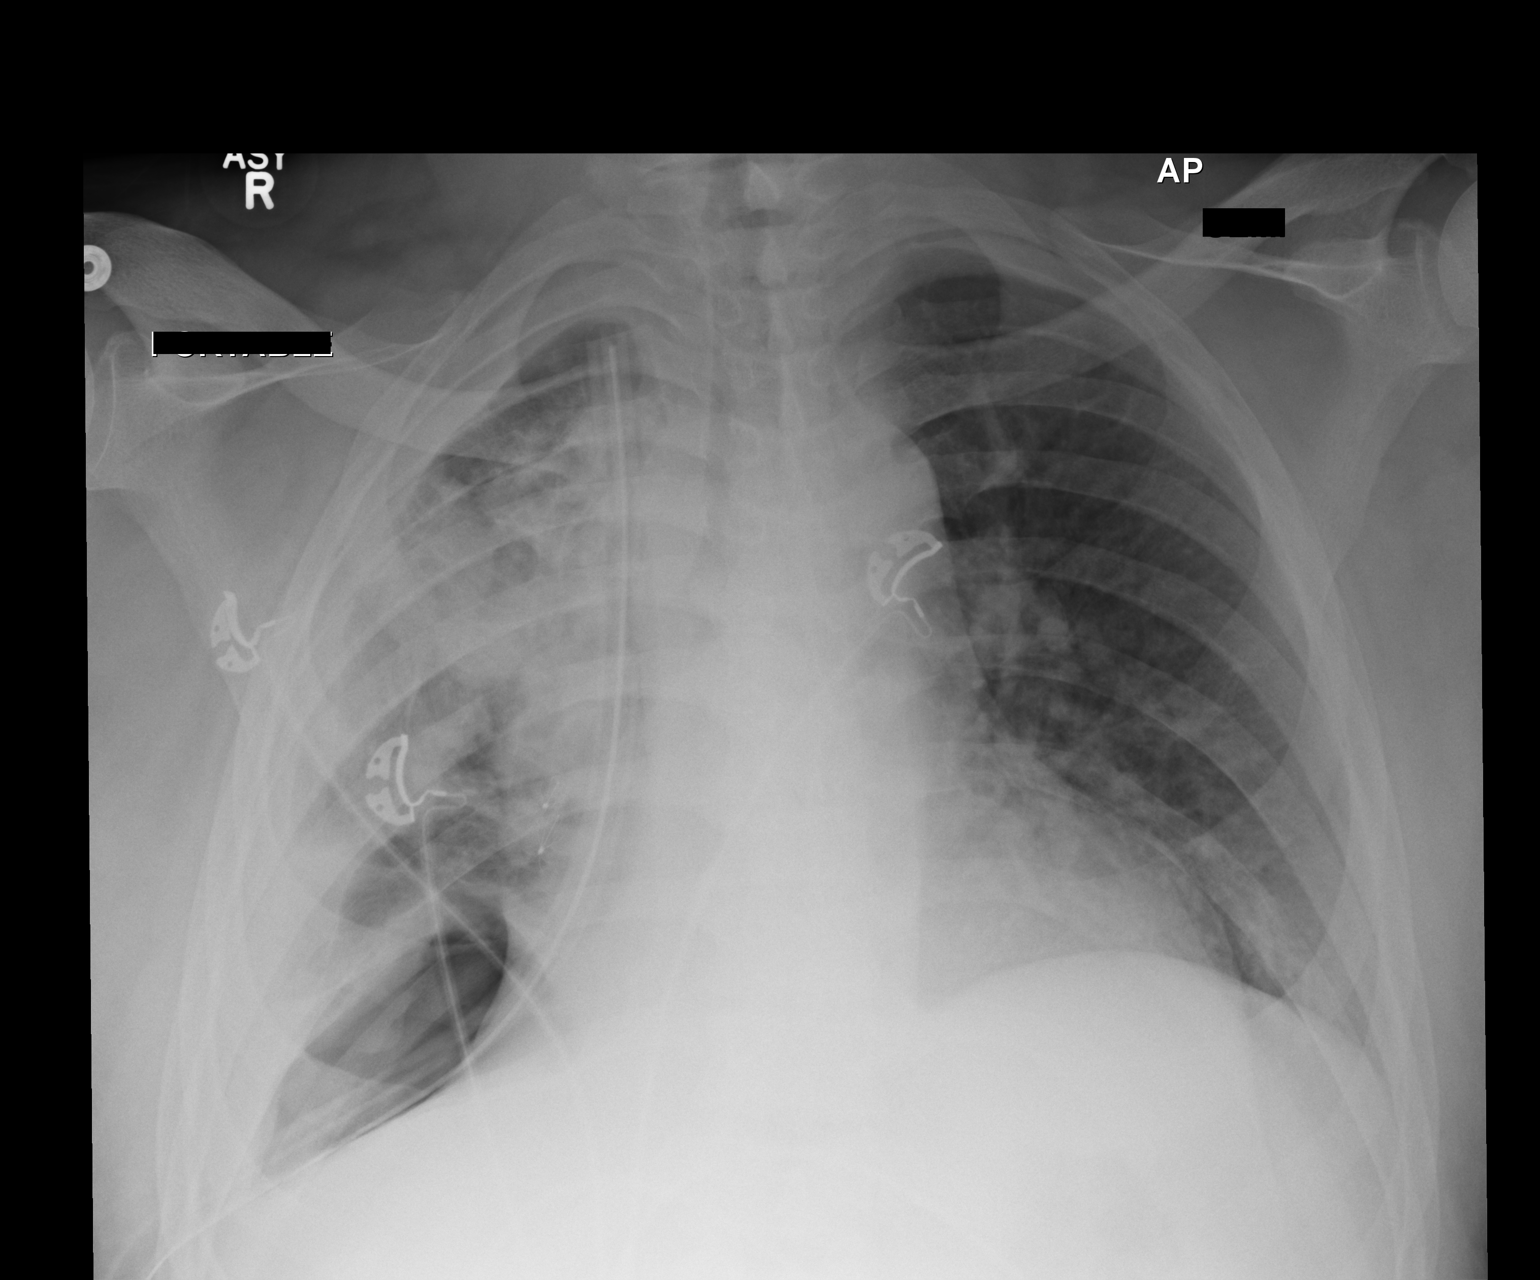

[1 of 1 positions shown; findings below may reference images not displayed]

FINDINGS: Right-sided chest tube remains place with postoperative
changes including radiopaque structures right infrahilar region
noted.

Inferiorly located loculated right-sided
pneumothorax/hydropneumothorax relatively similar to the prior
exam.

Pulmonary vascular prominence left lung has progressed slightly
since prior exam.

Cardiomegaly.  Prominence of mediastinum.
IMPRESSION: Similar appearance of inferiorly located right-sided
pneumothorax/hydropneumothorax.

Postoperative changes right lung otherwise similar to the prior
exam.

Cardiomegaly and mediastinal prominence minimally more notable than
on prior exam.

Left lung pulmonary vascular congestion.

## 2014-06-05 IMAGING — CR DG CHEST 2V
2 series · 2 of 2 positions shown · non-contrast
Comparison: Portable chest x-ray of 01/20/2013

CLINICAL DATA: Pleural effusion, chest tube, follow-up, short of
breath

CHEST - 2 VIEW

[w chest pa]
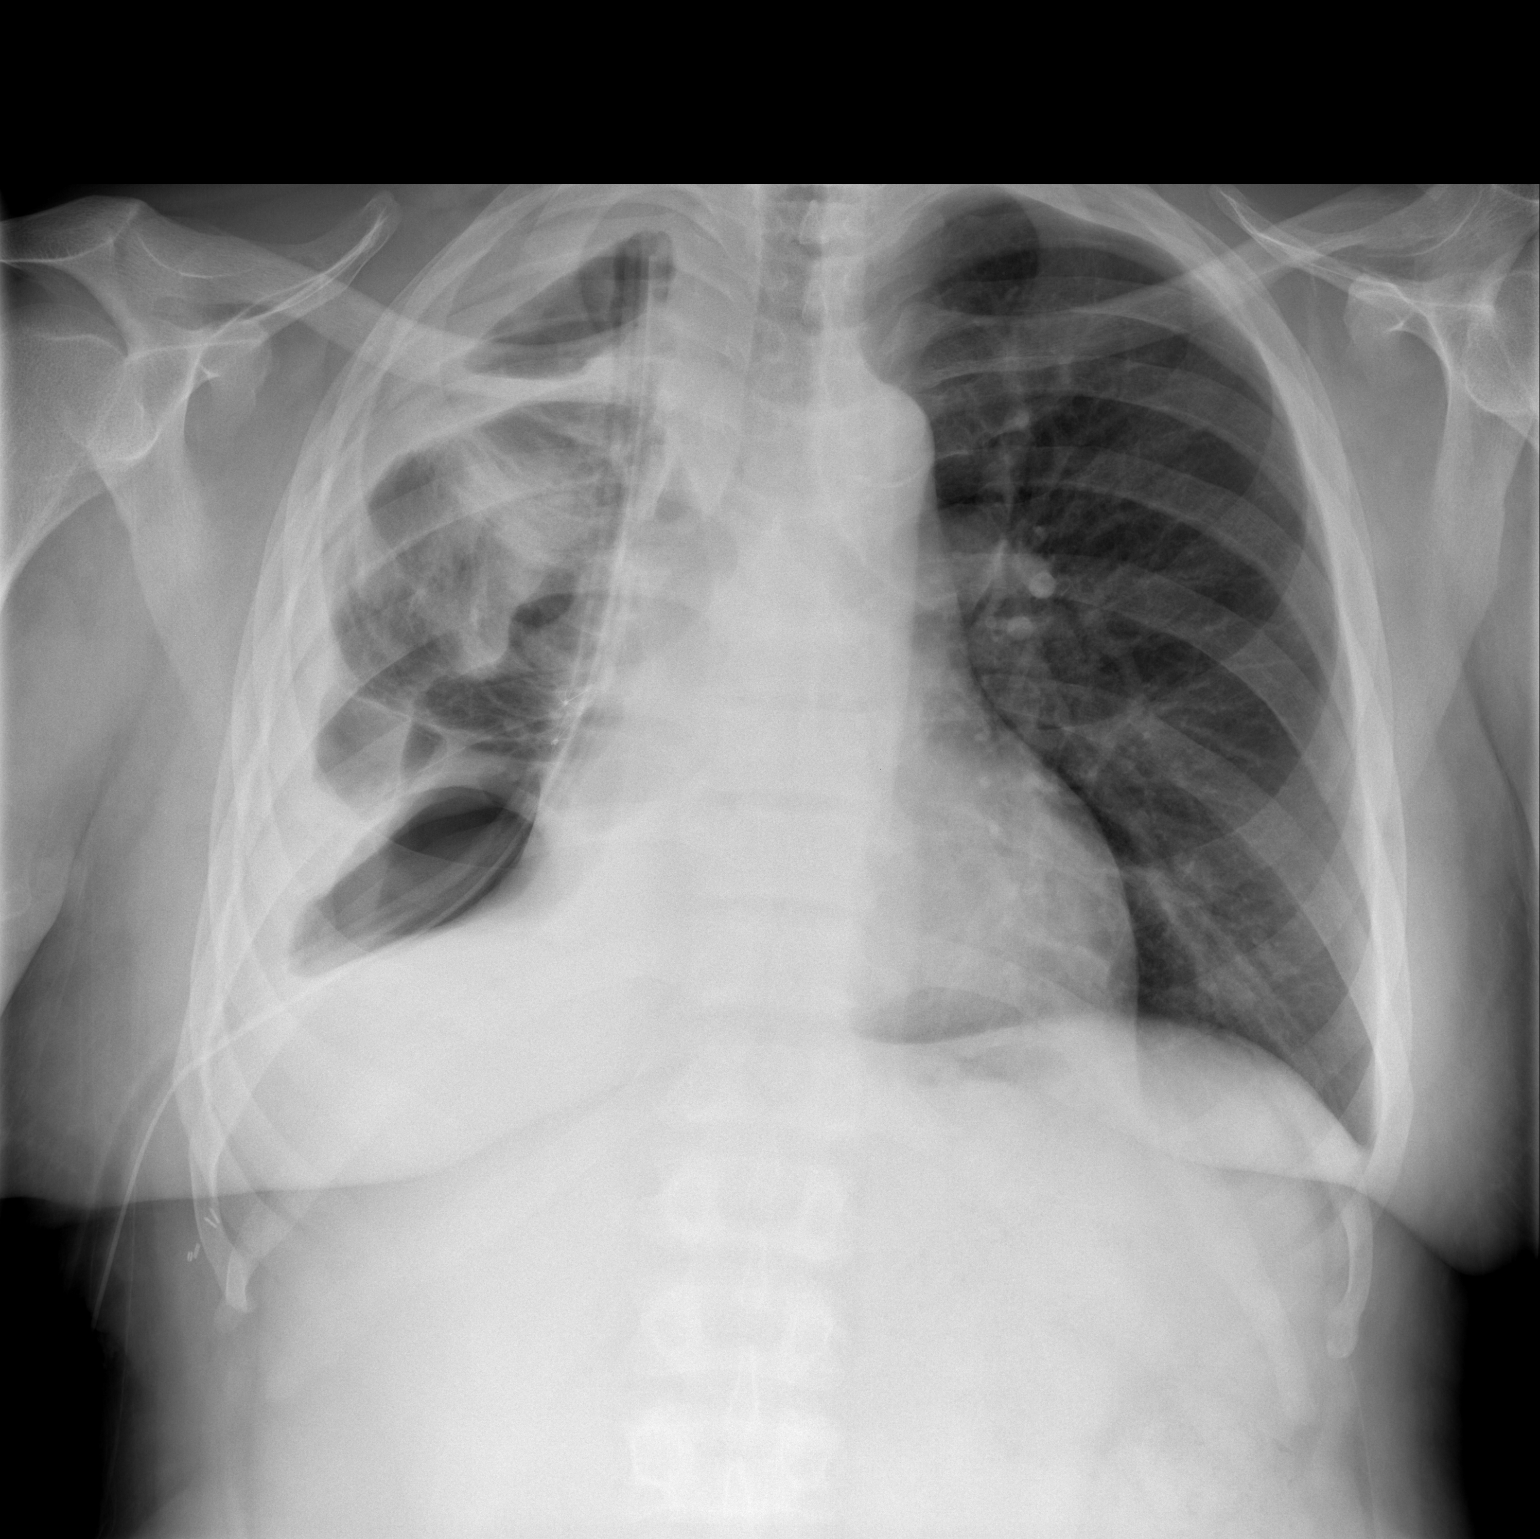

[w chest lat]
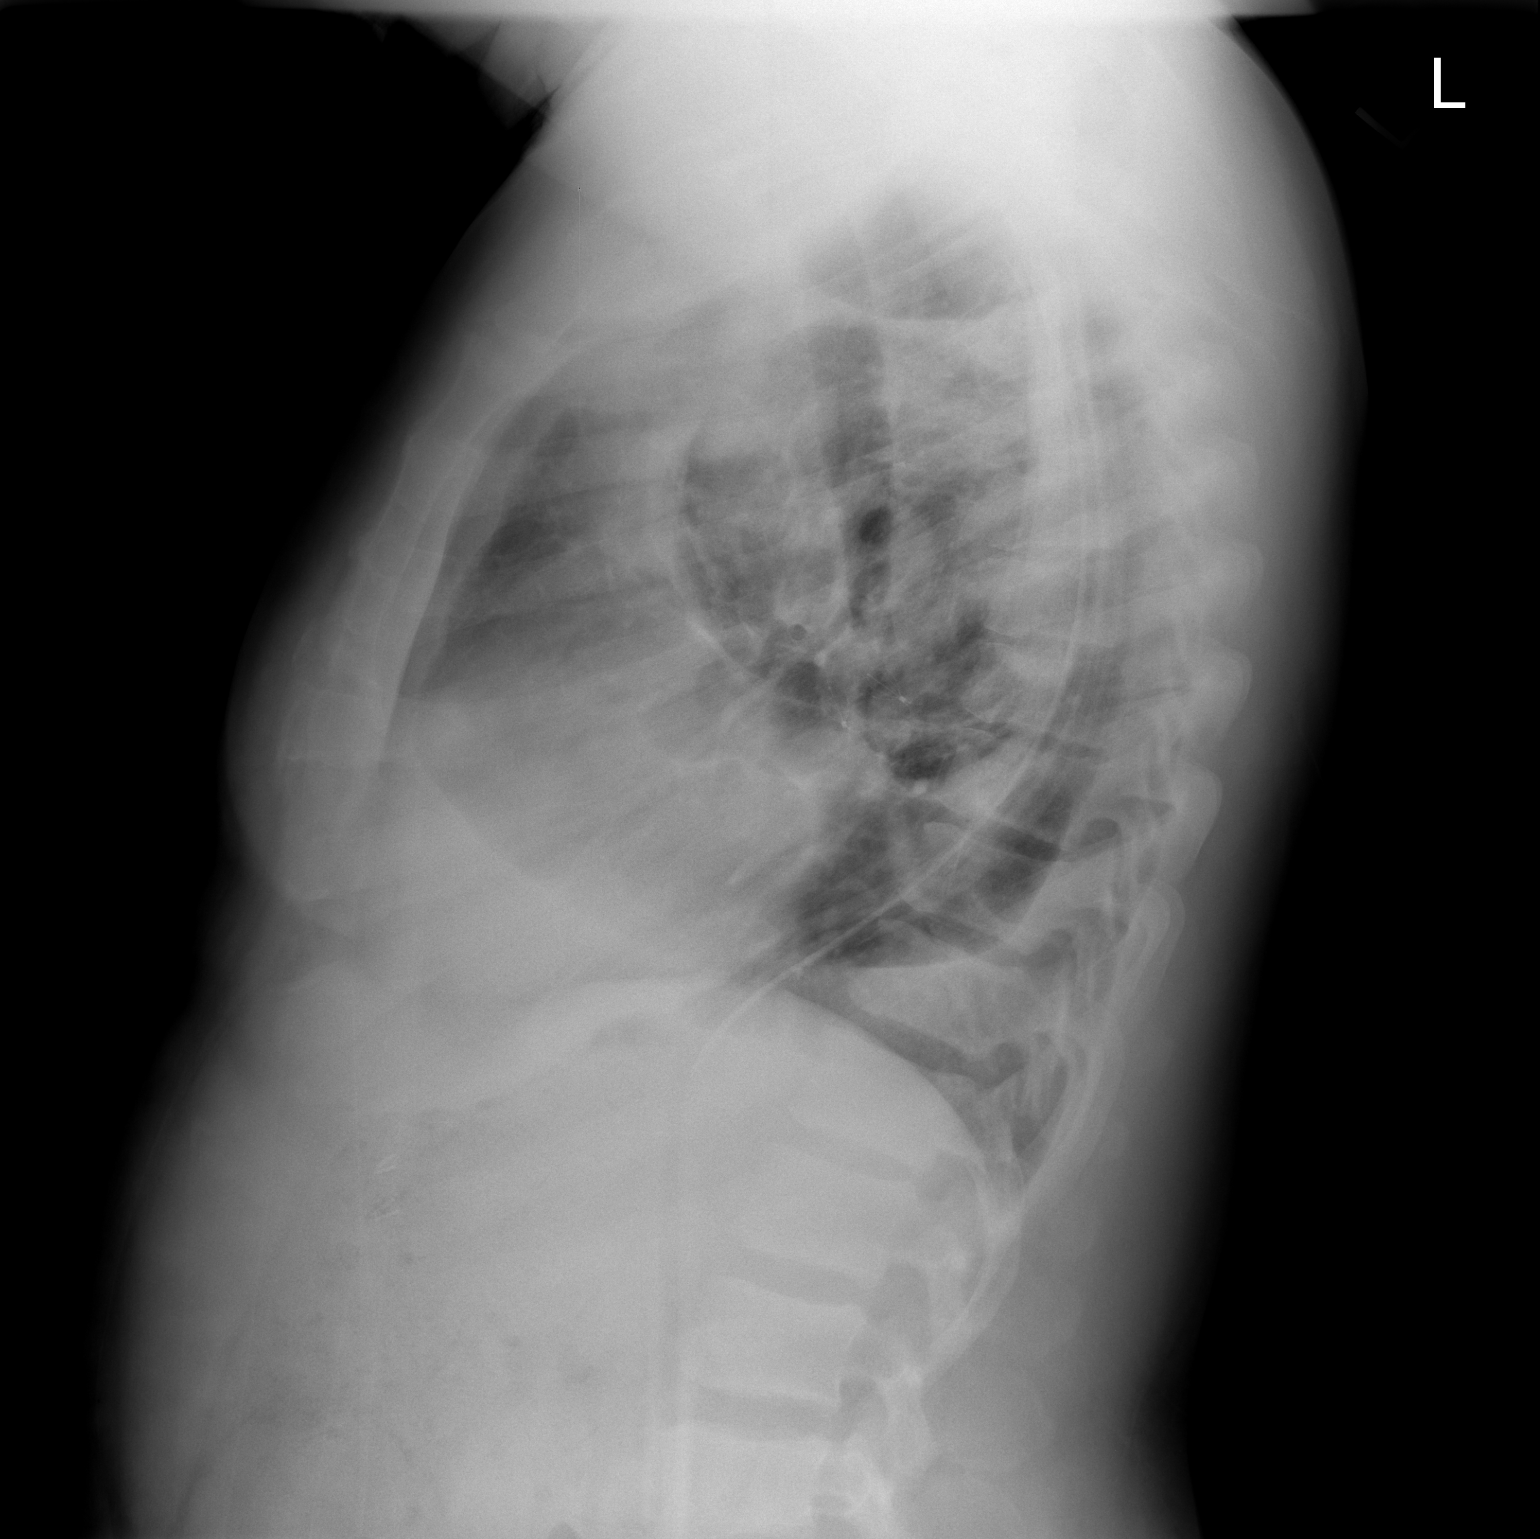

[2 of 2 positions shown; findings below may reference images not displayed]

FINDINGS: There is again poor aeration of the right lung with
pleural and parenchymal opacities diffusely throughout the right
hemithorax.  There is pleural air in the right apex and at the
right lung base consistent with right hydropneumothorax with right
chest tube remaining.  The left lung is clear.  Heart size is
stable.  A small right pleural effusion blunts the posterior
costophrenic angle.
IMPRESSION: Probable loculated right hydropneumothorax. Pleural and parenchymal
opacities throughout the right hemithorax with volume loss.

## 2014-06-19 IMAGING — CR DG CHEST 2V
2 series · 2 of 2 positions shown · non-contrast
Comparison: 01/27/2013

CLINICAL DATA: Follow up pleural effusion

CHEST - 2 VIEW

[w chest pa]
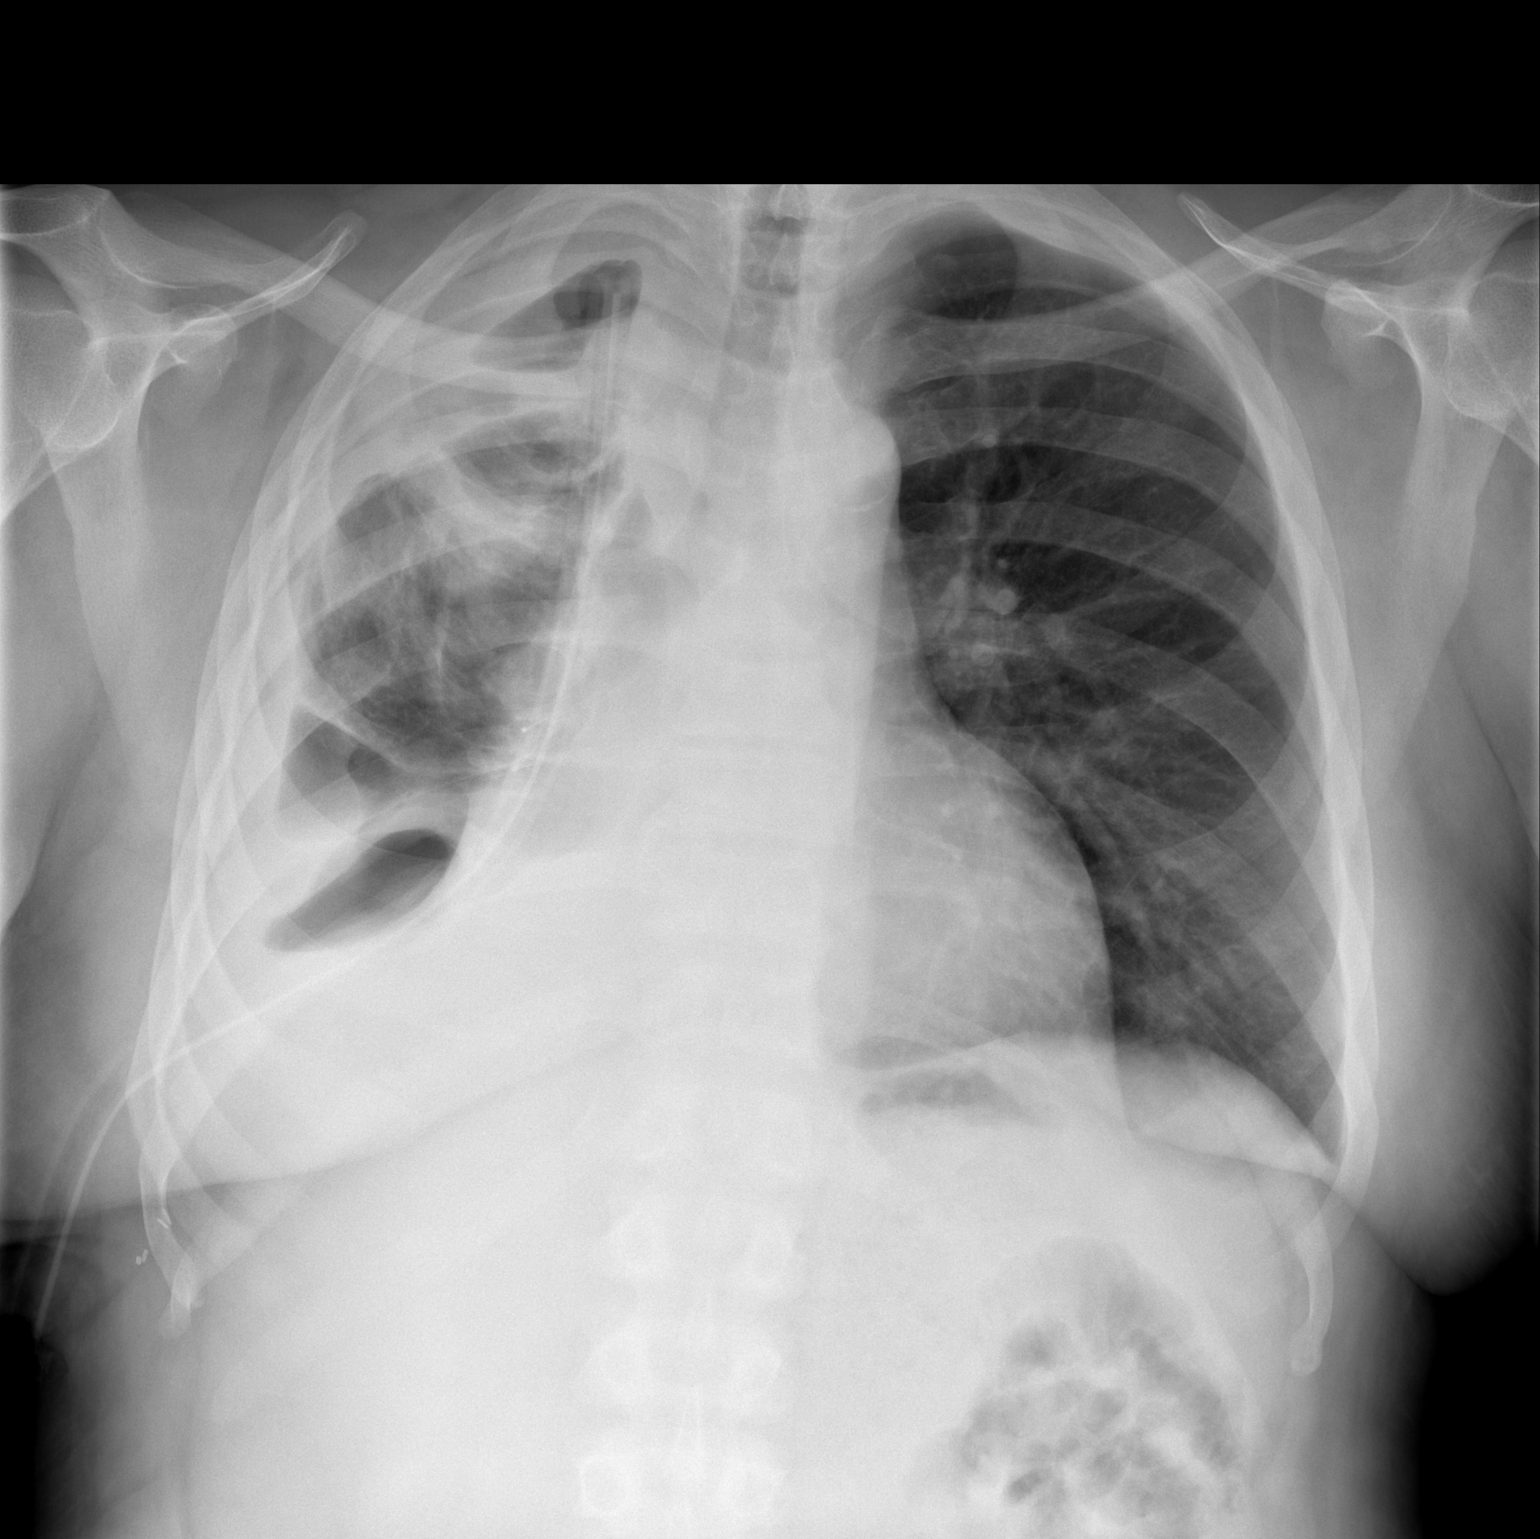

[w chest lat]
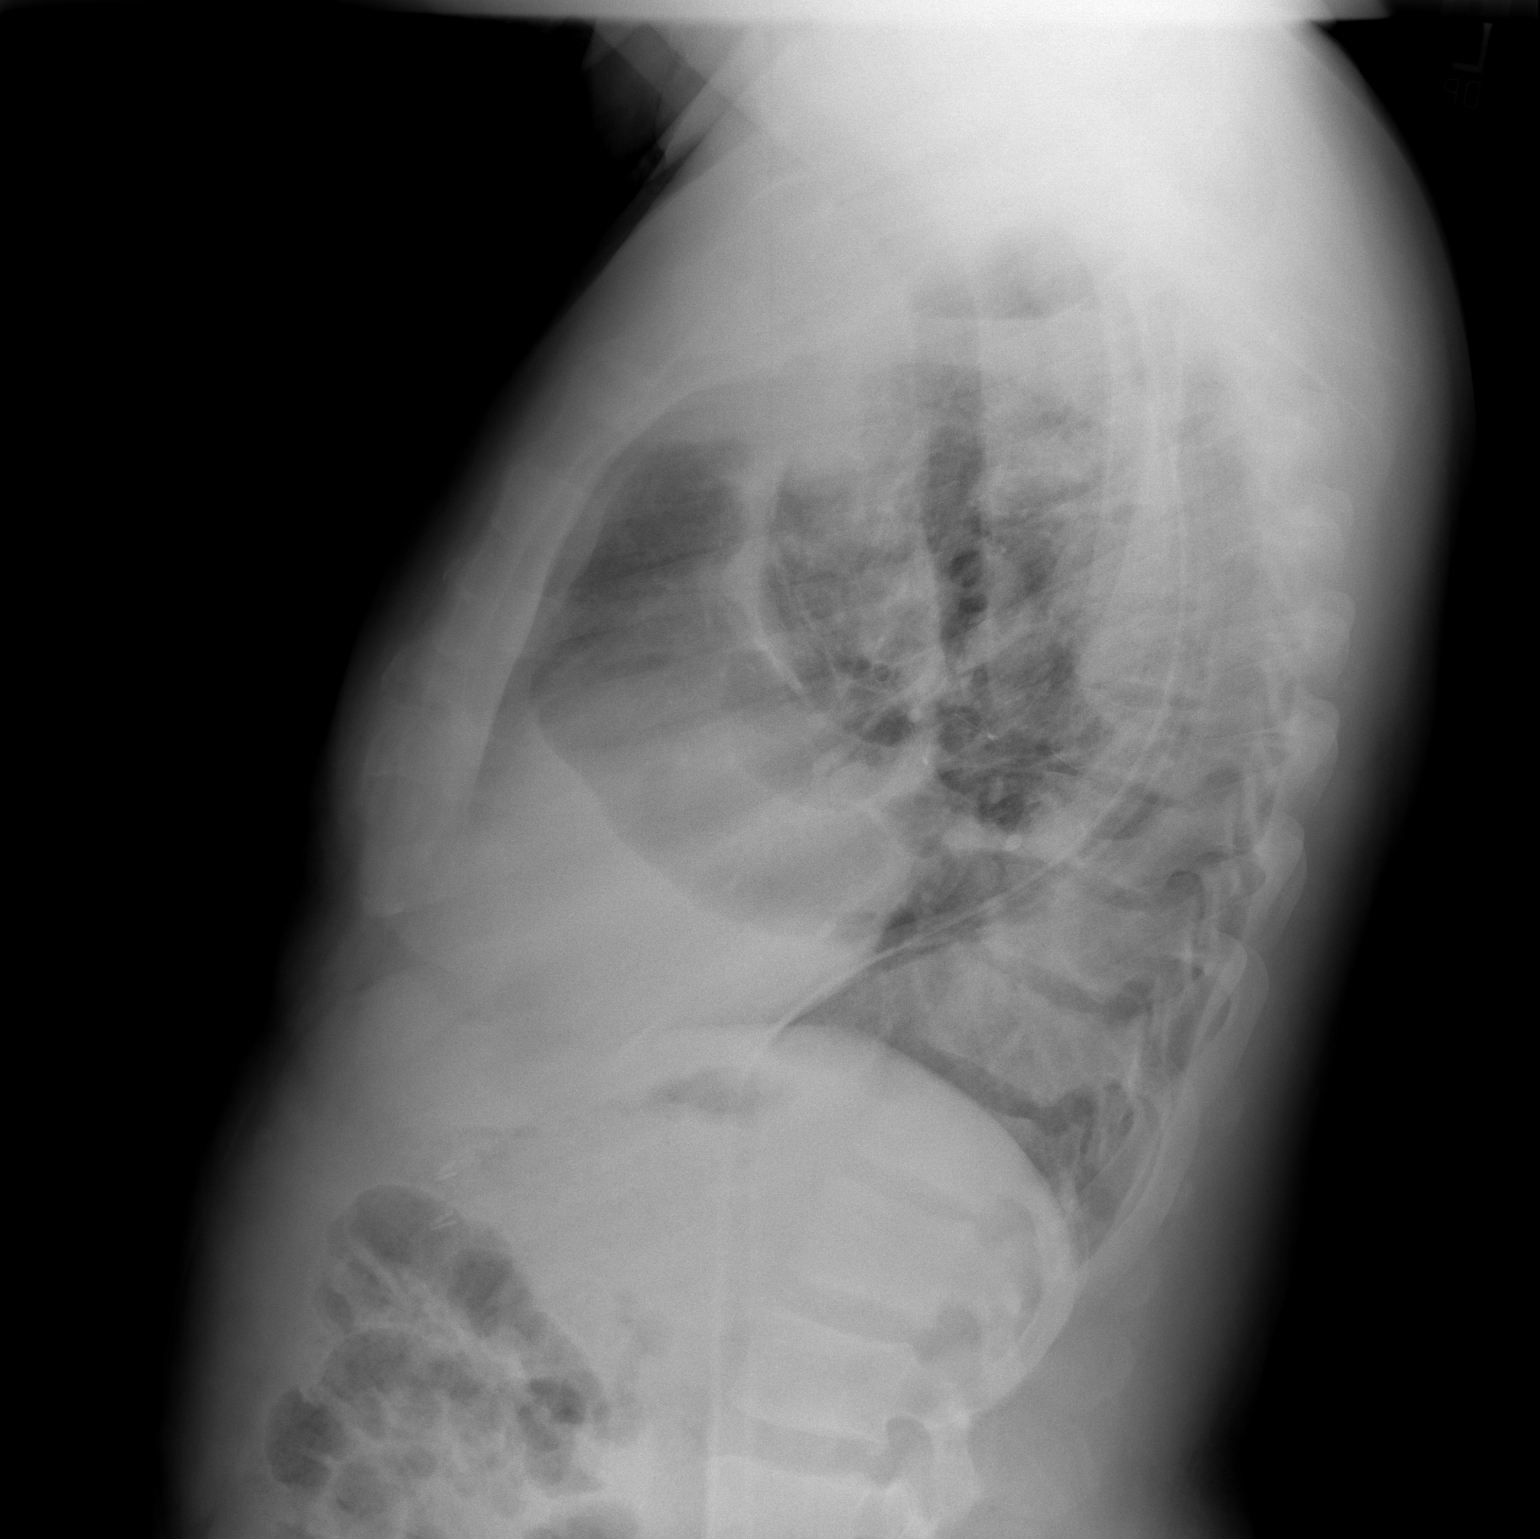

[2 of 2 positions shown; findings below may reference images not displayed]

FINDINGS: Cardiomediastinal silhouette is stable.  Stable right
chest tube position.  Stable loculated right hydro pneumothorax.
Left lung is clear.  Bony thorax is stable.
IMPRESSION: Stable right chest tube position.  Again noted loculated right
hydro pneumothorax.  Left lung is clear.

## 2014-06-28 IMAGING — CR DG CHEST 1V PORT
1 series · 1 of 1 positions shown · non-contrast
Comparison: 02/17/2013

CLINICAL DATA: Postop

PORTABLE CHEST - 1 VIEW

[AP]
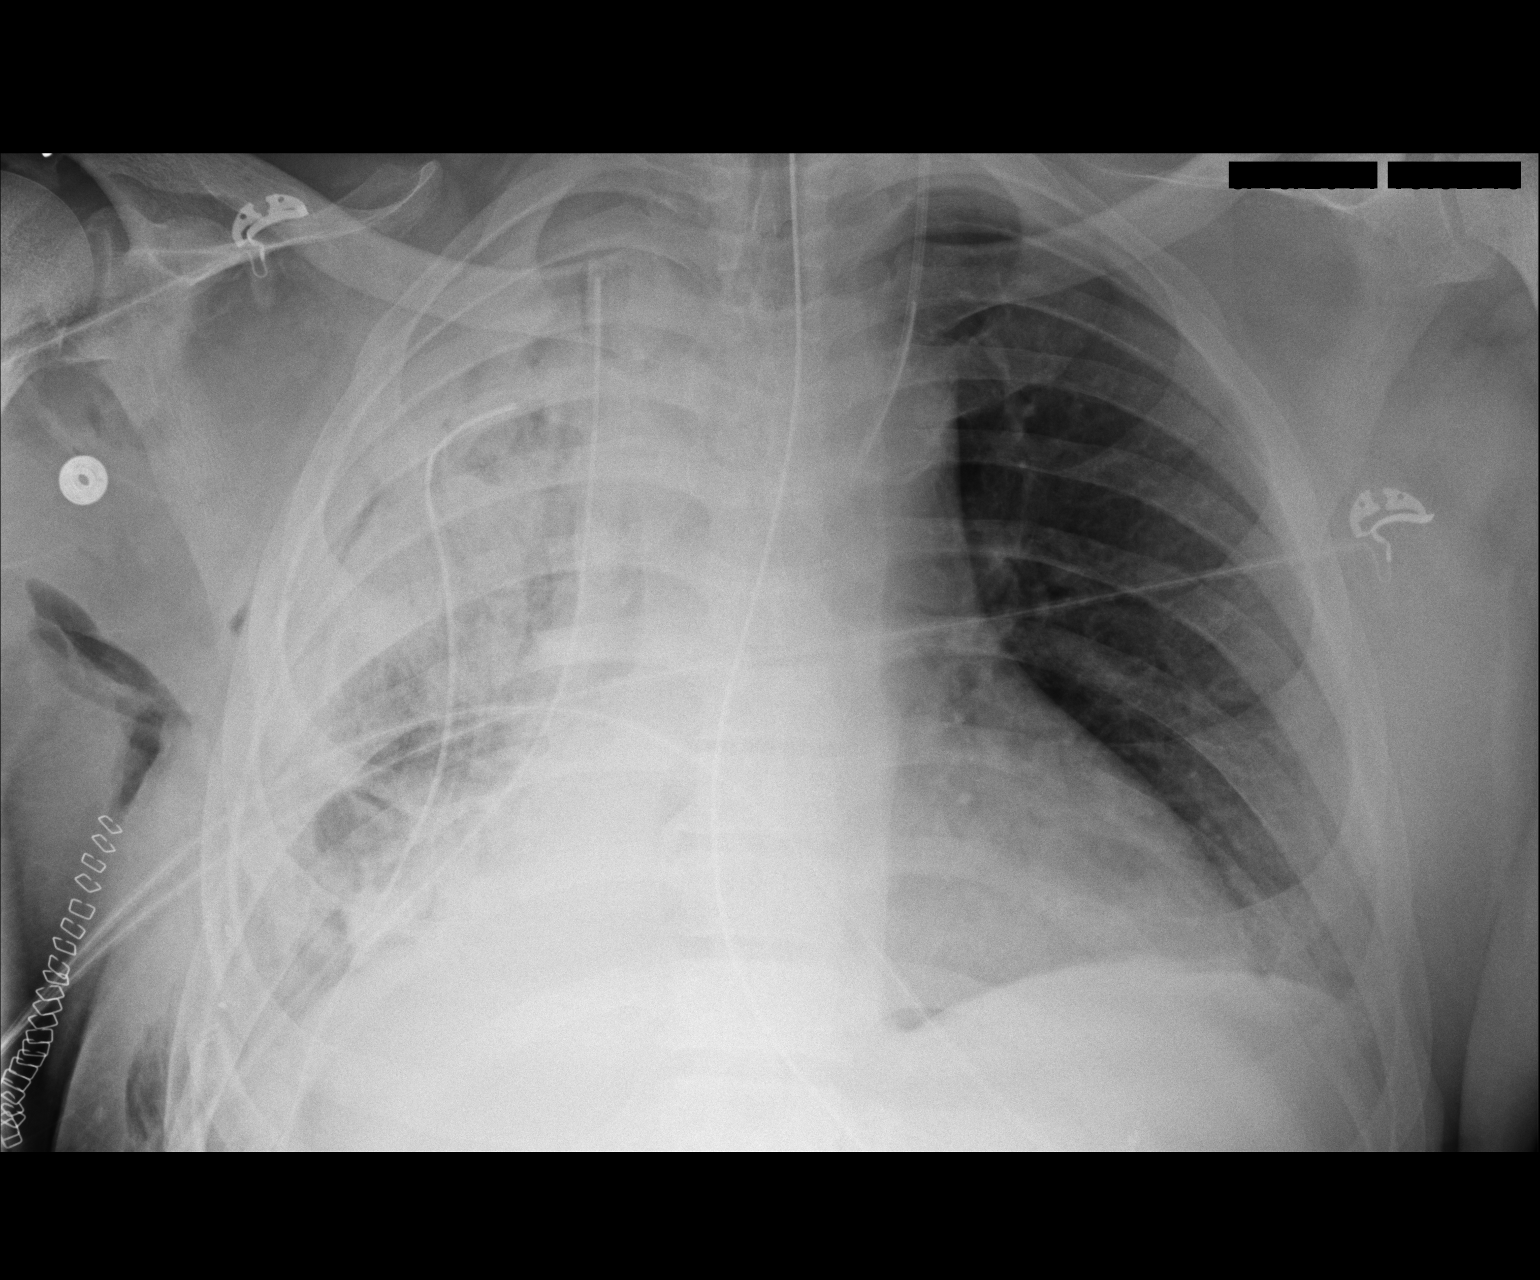

[1 of 1 positions shown; findings below may reference images not displayed]

FINDINGS: Two chest tubes are not present on the right.  Decrease
in right basilar pneumothorax.  There is right effusion and
extensive airspace disease on the right.

Left lung remains clear.

NG tube enters the stomach.  Left jugular catheter tip in the left
innominate vein.  No pneumothorax.
IMPRESSION: There are now two chest tubes on the right with decrease in right
basilar pneumothorax.  There remains moderate right effusion and
right sided airspace disease.

Left jugular catheter tip in the left innominate vein without
pneumothorax.

## 2014-06-29 IMAGING — CR DG CHEST 1V PORT
1 series · 1 of 1 positions shown · non-contrast
Comparison: Chest x-ray 02/19/2013.

CLINICAL DATA: Evaluate chest tubes.  Status post surgery.

PORTABLE CHEST - 1 VIEW

[AP]
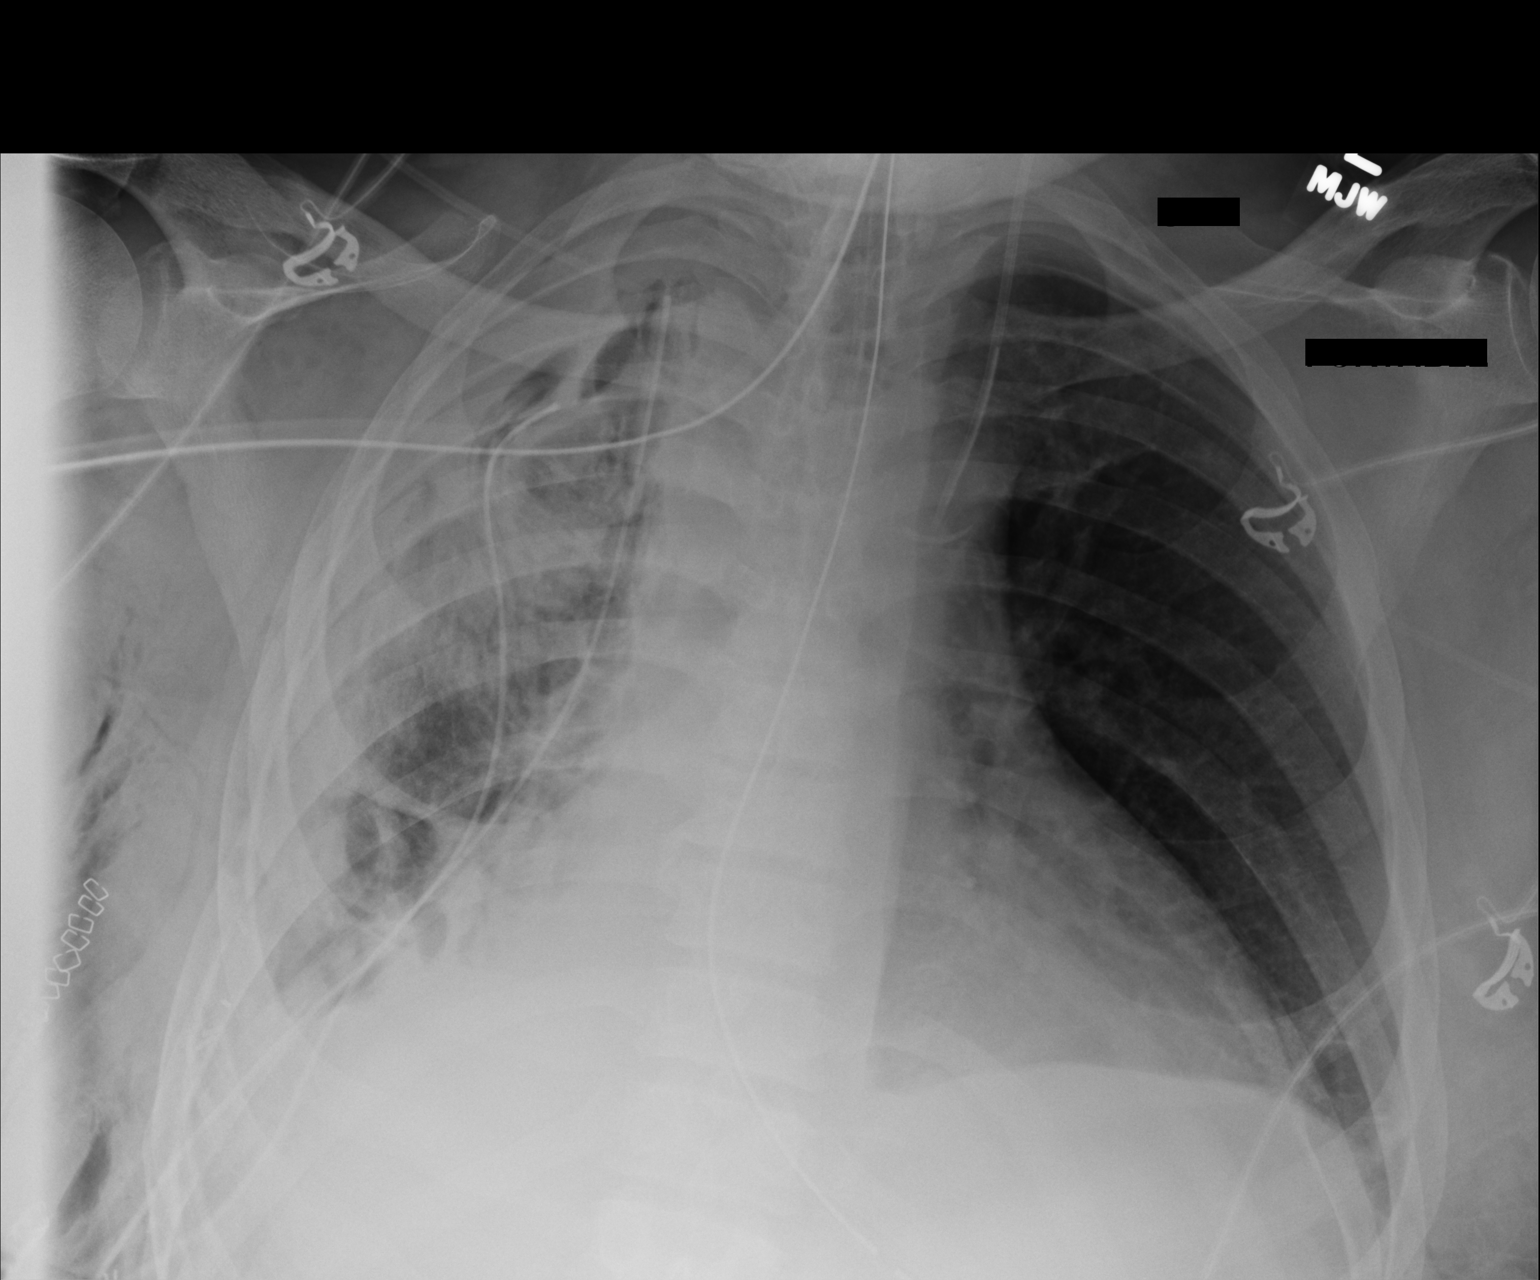

[1 of 1 positions shown; findings below may reference images not displayed]

FINDINGS: Two right-sided chest tubes remain in position with tips
and side port projecting over the upper right hemithorax.
Extensive architectural distortion and volume loss is again noted
in the right lung, however, aeration has slightly improved compared
to the prior study.  There continues to be moderate to large right-
sided pleural effusion.  There are several small lucencies in the
right hemithorax superiorly and inferiorly which suggests the
presence of some residual loculated pleural air (i.e., this is a
complex hydropneumothorax), however, this is a small volume of gas
at this time.  Left lung is clear.  No left pleural effusion.  No
evidence of pulmonary edema.  Heart size is within normal limits.
Mediastinal contours are distorted by patient positioning.
Atherosclerosis in the thoracic aorta. A nasogastric tube is seen
extending into the stomach, however, the tip of the nasogastric
tube extends below the lower margin of the image. There is a left-
sided internal jugular central venous catheter with tip terminating
in the left innominate vein. Skin staples overlying the right chest
wall laterally where there is extensive subcutaneous emphysema.
IMPRESSION: 1.  Support apparatus, as above.
2.  Persistent complex right-sided hydropneumothorax slightly
decreased compared to the prior study, with slight improved
aeration throughout the right lung.  The appearance of the chest is
otherwise essentially unchanged.

## 2014-06-30 IMAGING — CR DG CHEST 1V PORT
1 series · 1 of 1 positions shown · non-contrast
Comparison: One-view chest to 02/20/2013

CLINICAL DATA: Status post right thoracotomy.

PORTABLE CHEST - 1 VIEW

[AP]
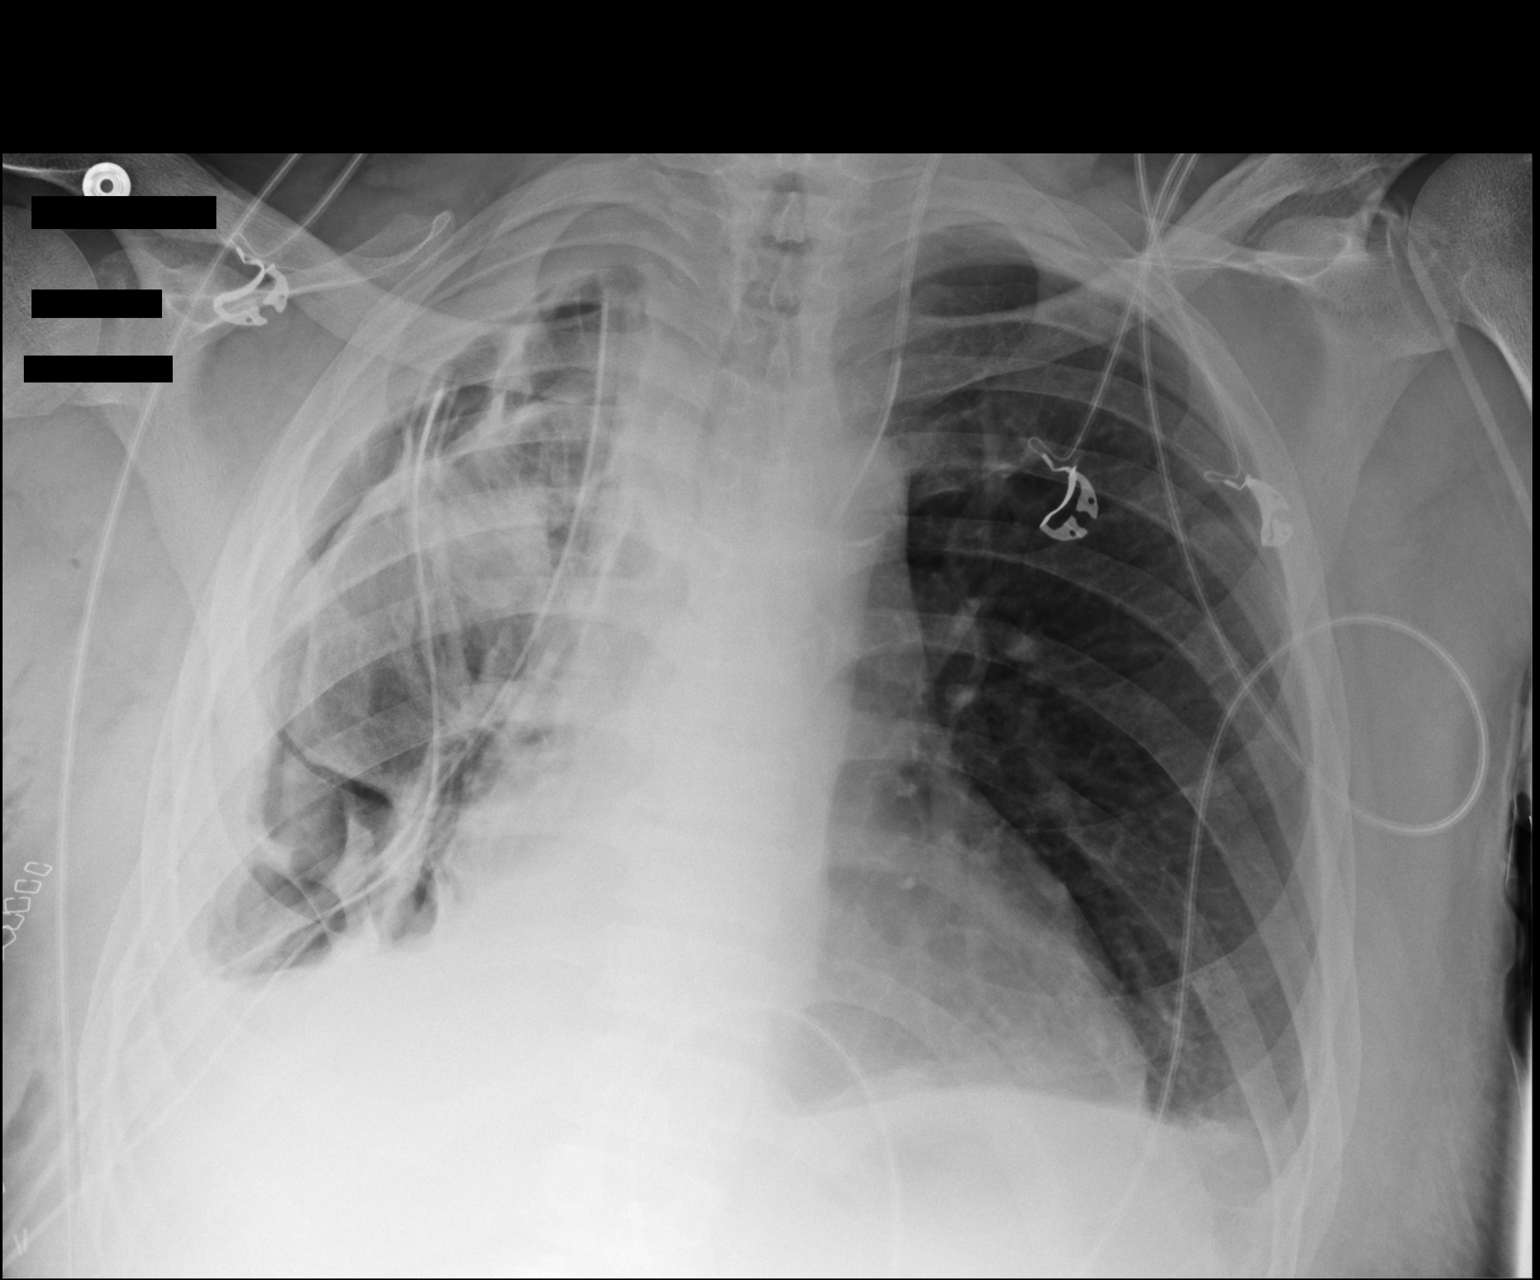

[1 of 1 positions shown; findings below may reference images not displayed]

FINDINGS: NG tube has been removed.  A left IJ line stable.  To
right-sided chest tubes are in place.  A hydropneumothorax
persists.  Minimal atelectasis is present at the left base.
IMPRESSION: 1.  Persistent right-sided hydropneumothorax with two chest tubes
in place.
2.  Status post removal of NG tube.
3.  The left lung is clear.

## 2014-07-01 IMAGING — CR DG CHEST 1V PORT
1 series · 1 of 1 positions shown · non-contrast
Comparison: 02/21/2013

CLINICAL DATA: Postop from right thoracoscopy..  End-stage renal
disease .

PORTABLE CHEST - 1 VIEW

[AP]
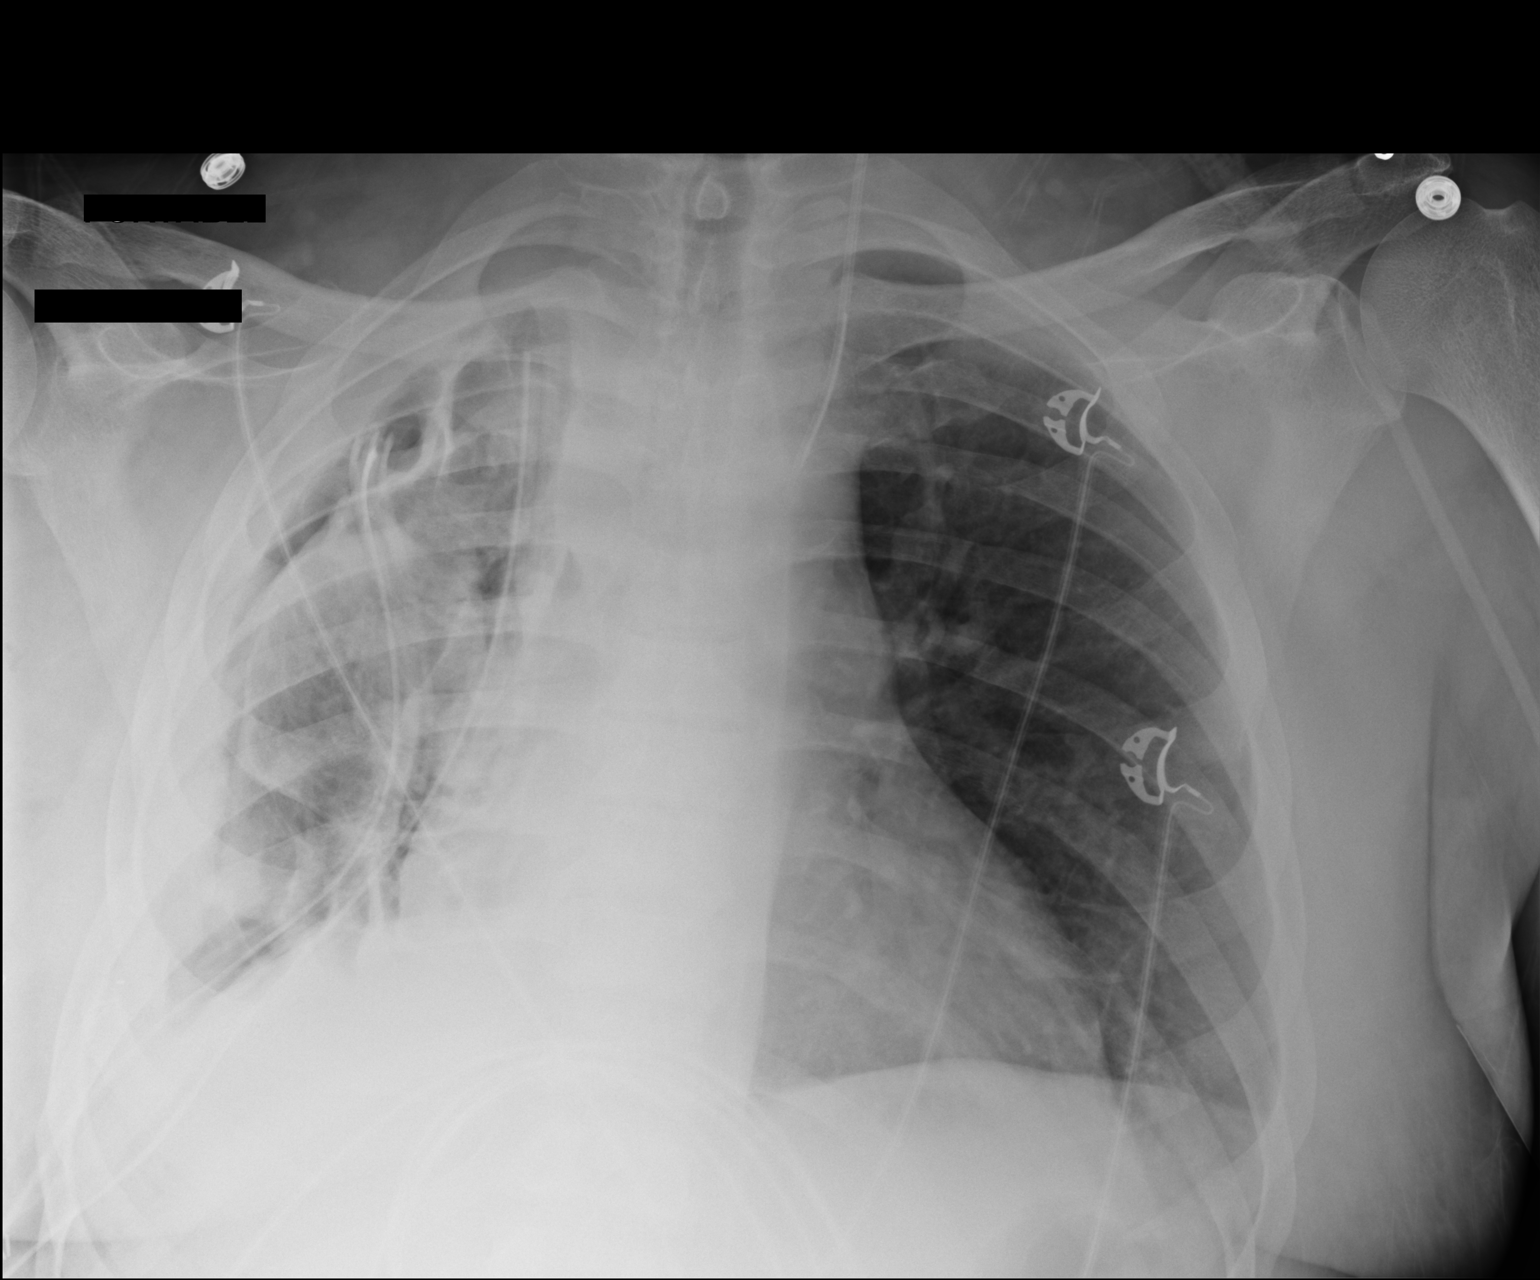

[1 of 1 positions shown; findings below may reference images not displayed]

FINDINGS: Two right-sided chest tube and left jugular center venous
catheter remain in stable position.  A small to moderate right
pneumothorax is stable in size.  Diffuse right pleural thickening
is also unchanged.  Left lung remains clear.  Heart size is stable.
IMPRESSION: Stable right pneumothorax, with right chest tubes remain in place.
Stable diffuse right pleural thickening.

## 2014-07-02 IMAGING — CR DG CHEST 1V PORT
1 series · 1 of 1 positions shown · non-contrast
Comparison: 02/22/2013

CLINICAL DATA: Status post thoracoscopy, persistent air leak

PORTABLE CHEST - 1 VIEW

[AP]
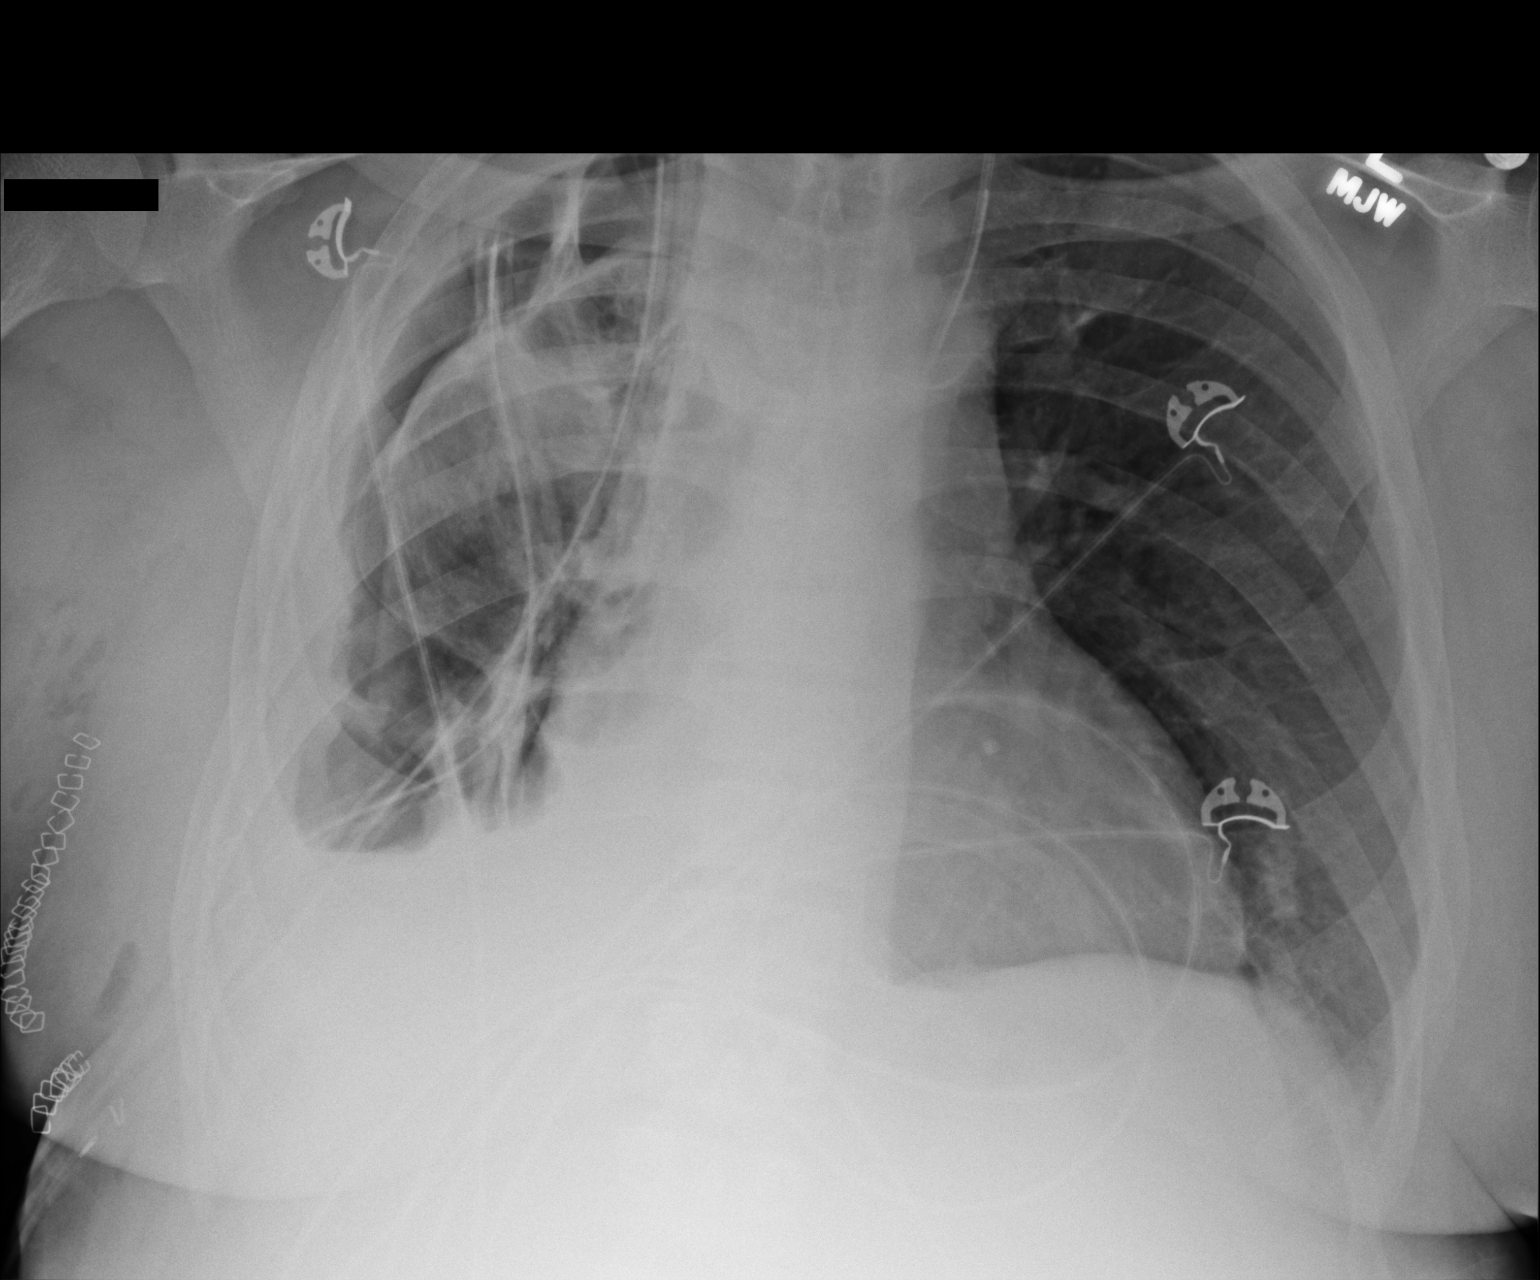

[1 of 1 positions shown; findings below may reference images not displayed]

FINDINGS: Two chest tubes remain in place on the right.  A right-
sided pneumothorax is again seen and stable from prior study.
Postsurgical changes are noted. The left lung remains well aerated
without focal infiltrate or sizable effusion.  A left-sided central
venous line is stable.  The cardiac shadow is stable.
IMPRESSION: Postoperative changes on the right with stable pneumothorax.  No
new focal abnormality is seen.

## 2014-07-03 IMAGING — CR DG CHEST 1V PORT
1 series · 1 of 1 positions shown · non-contrast
Comparison: Chest x-ray 02/23/2013.

CLINICAL DATA: Evaluate chest tube position.  Status post
thoracotomy.

PORTABLE CHEST - 1 VIEW

[AP]
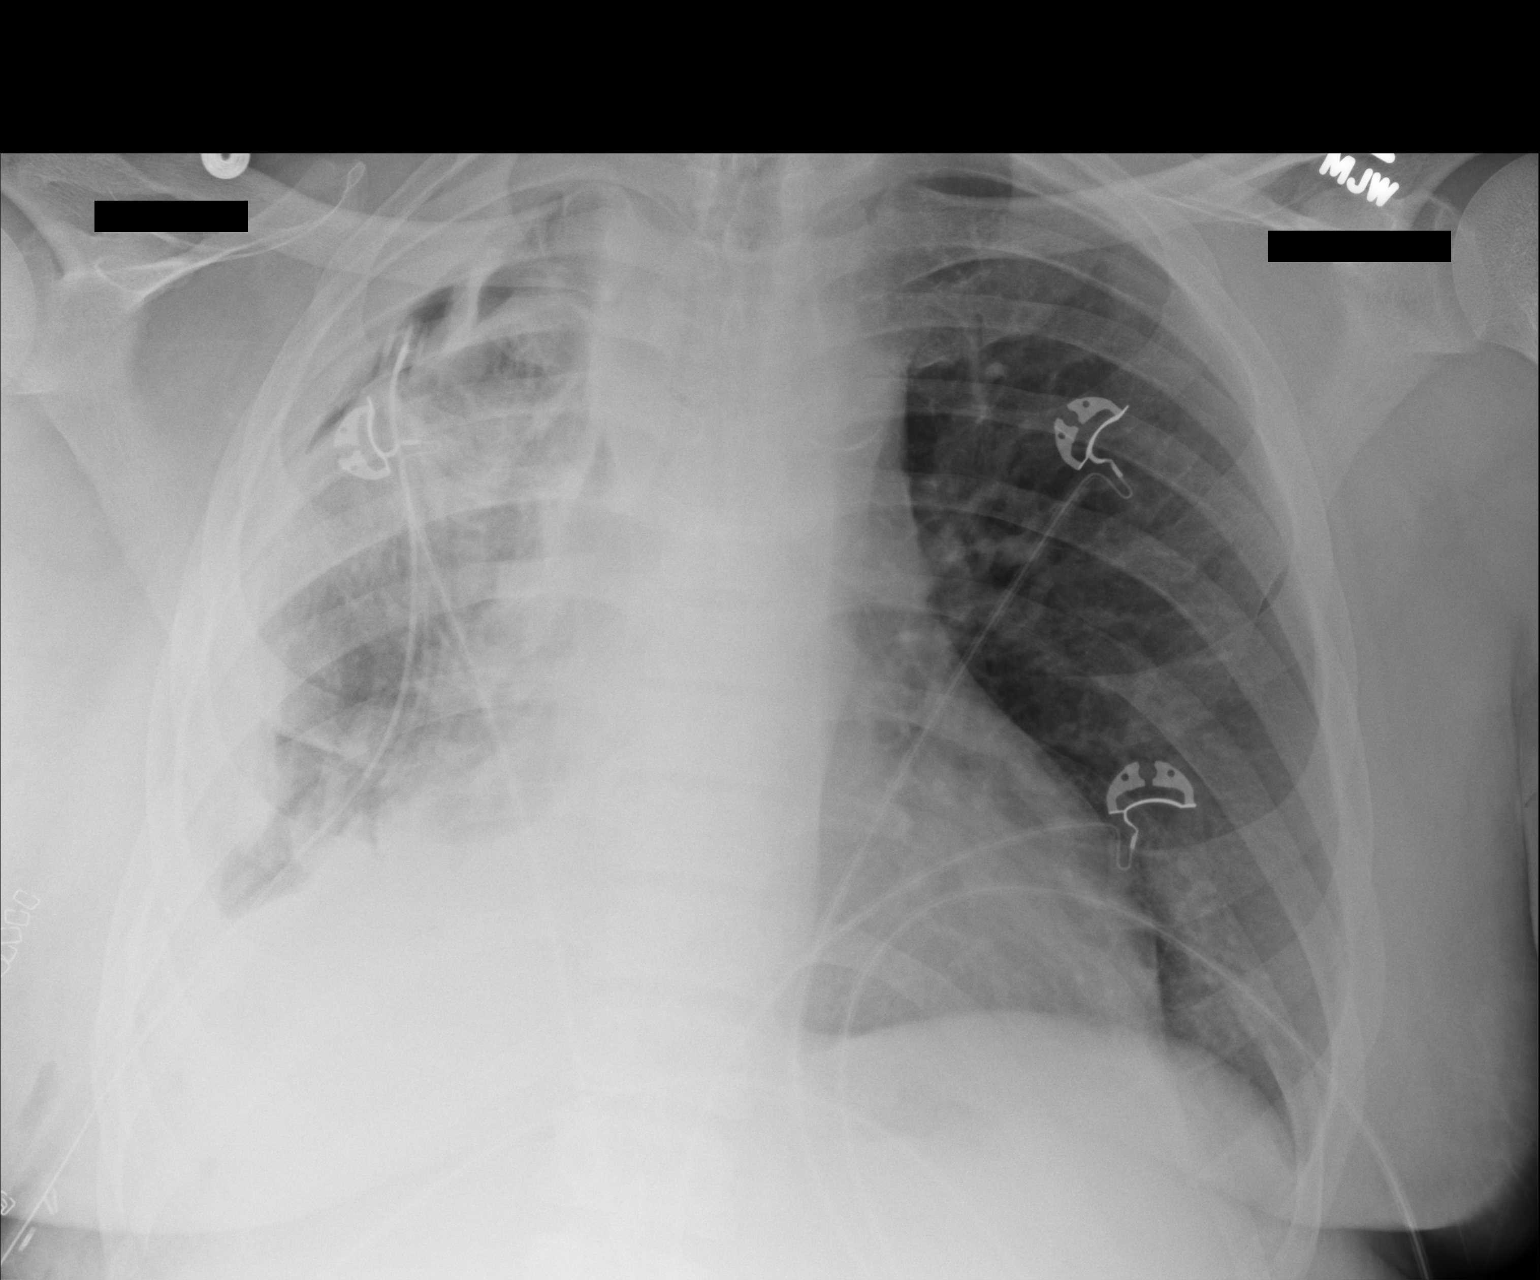

[1 of 1 positions shown; findings below may reference images not displayed]

FINDINGS: One of the previously noted right-sided chest tubes has
been removed.  The other remains stable in position with tip
projecting over the upper right hemithorax.  Previously noted right-
sided pneumothorax has slightly decreased in size and is now small.
Moderate volume of complex likely multilocular pleural fluid has
increased.  Extensive architectural distortion and worsening
opacities throughout the right lung.  Left lung appears well
aerated.  No left pleural effusion or left pneumothorax.  Heart
size is mildly enlarged.  Atherosclerosis in the thoracic aorta.
Surgical clips are seen projecting over the lateral right chest
wall with a small amount of subcutaneous emphysema in this region
as well.
IMPRESSION: 1.  Support apparatus and postoperative changes, as above.
2.  Complex right-sided hydropneumothorax with increased volume of
pleural fluid but decreased pneumothorax component.
3.  Slight worsening aeration throughout the right lung may reflect
a combination of resolving postoperative edema/hemorrhage,
developing scarring, and/or worsening atelectasis.

## 2014-07-04 IMAGING — CR DG CHEST 1V PORT
1 series · 1 of 1 positions shown · non-contrast
Comparison: Chest x-ray 02/24/2013.

CLINICAL DATA: Evaluate chest tube.  Pleural effusion.

PORTABLE CHEST - 1 VIEW

[AP]
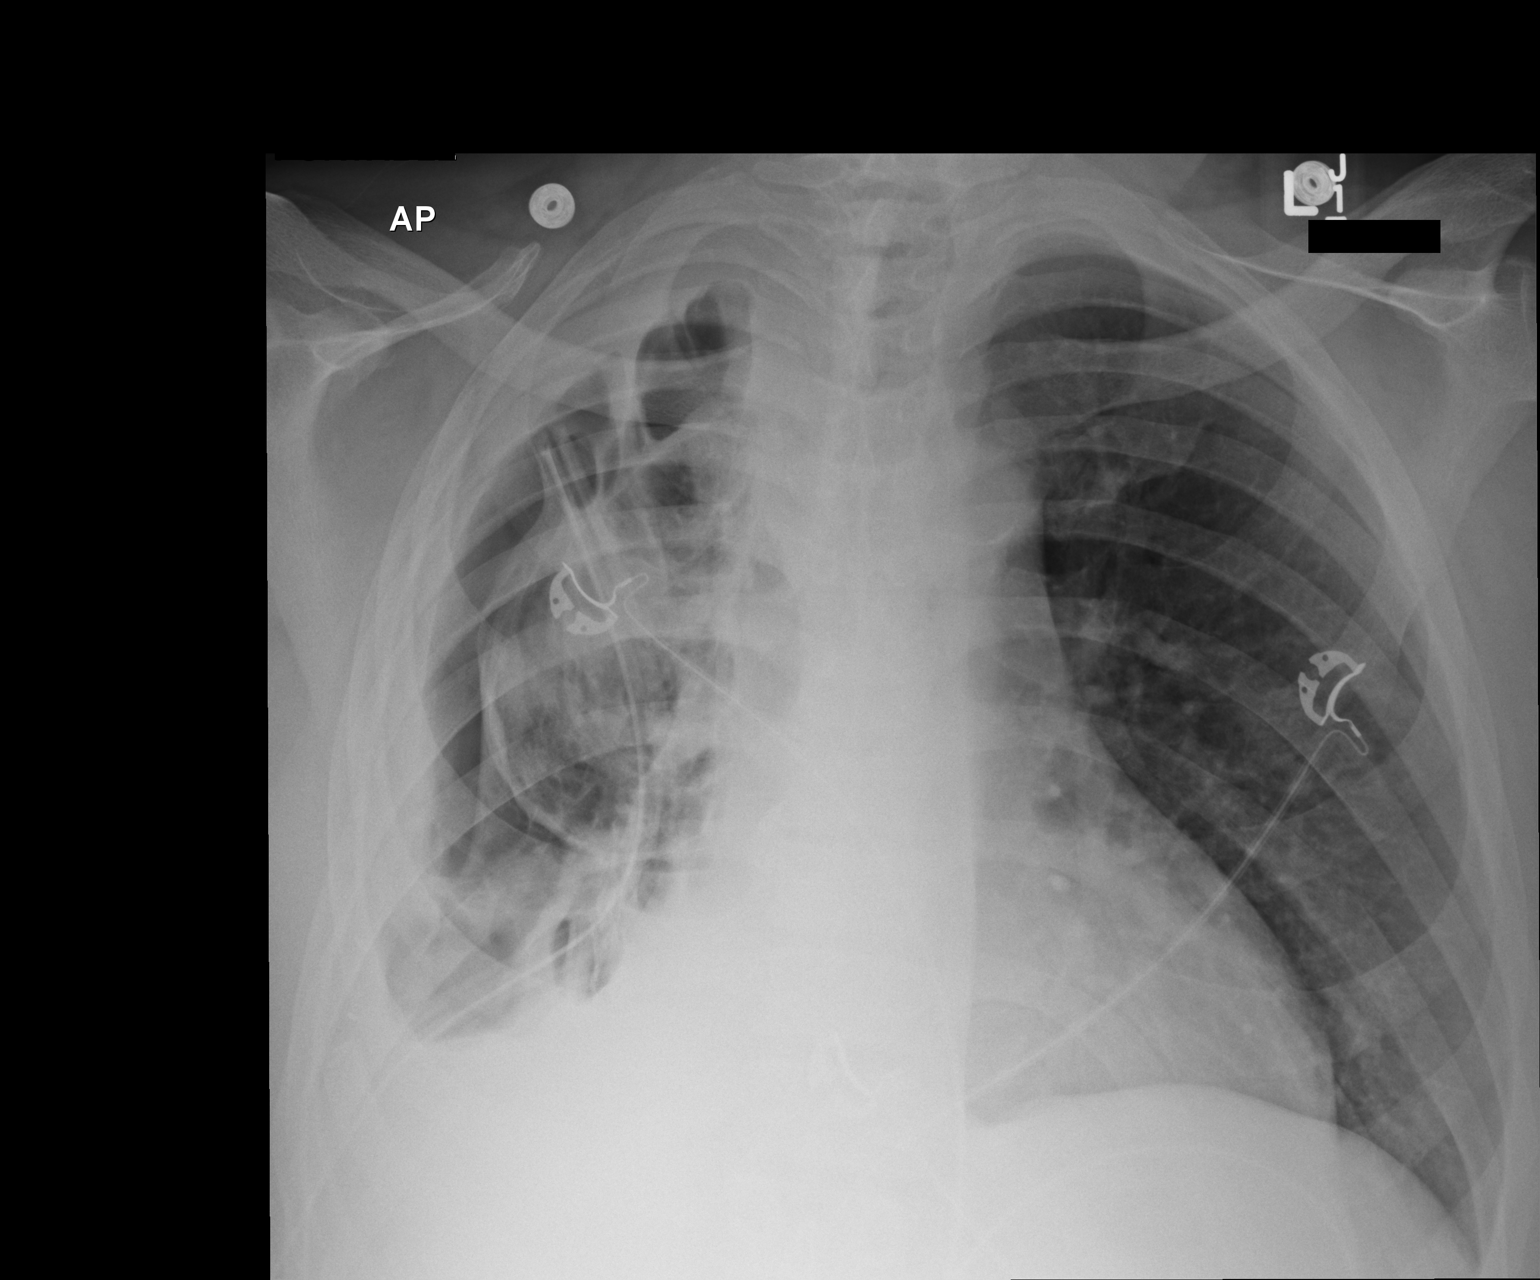

[1 of 1 positions shown; findings below may reference images not displayed]

FINDINGS: The previously noted right-sided chest tube remains
similarly positioned with tip in the upper right hemithorax.
Compared to the prior examination there has been interval
development of a moderate right-sided pneumothorax, in addition to
the chronic right-sided pleural effusion and extensive
pleuroparenchymal scarring.  Aeration has worsened throughout the
right lung, which now appears partially collapsed.  There is
persistent architectural distortion throughout the right lung.
Left lung is well-aerated.  No evidence of pulmonary edema.  Heart
size appears upper limits of normal.  Atherosclerosis in the
thoracic aorta.
IMPRESSION: 1. Interval development of moderate right-sided hydropneumothorax.
2.  Worsening aeration in the right lung, compatible with
increasing atelectasis superimposed on a background of underlying
chronic scarring and rounded atelectasis.
3.  Atherosclerosis.

These results will be called to the ordering clinician or
representative by the Radiologist Assistant, and communication
documented in the PACS Dashboard.

## 2014-07-05 IMAGING — CR DG CHEST 1V PORT
1 series · 1 of 1 positions shown · non-contrast
Comparison: Chest x-ray 02/25/2013.

CLINICAL DATA: Chest tube evaluation.

PORTABLE CHEST - 1 VIEW

[AP]
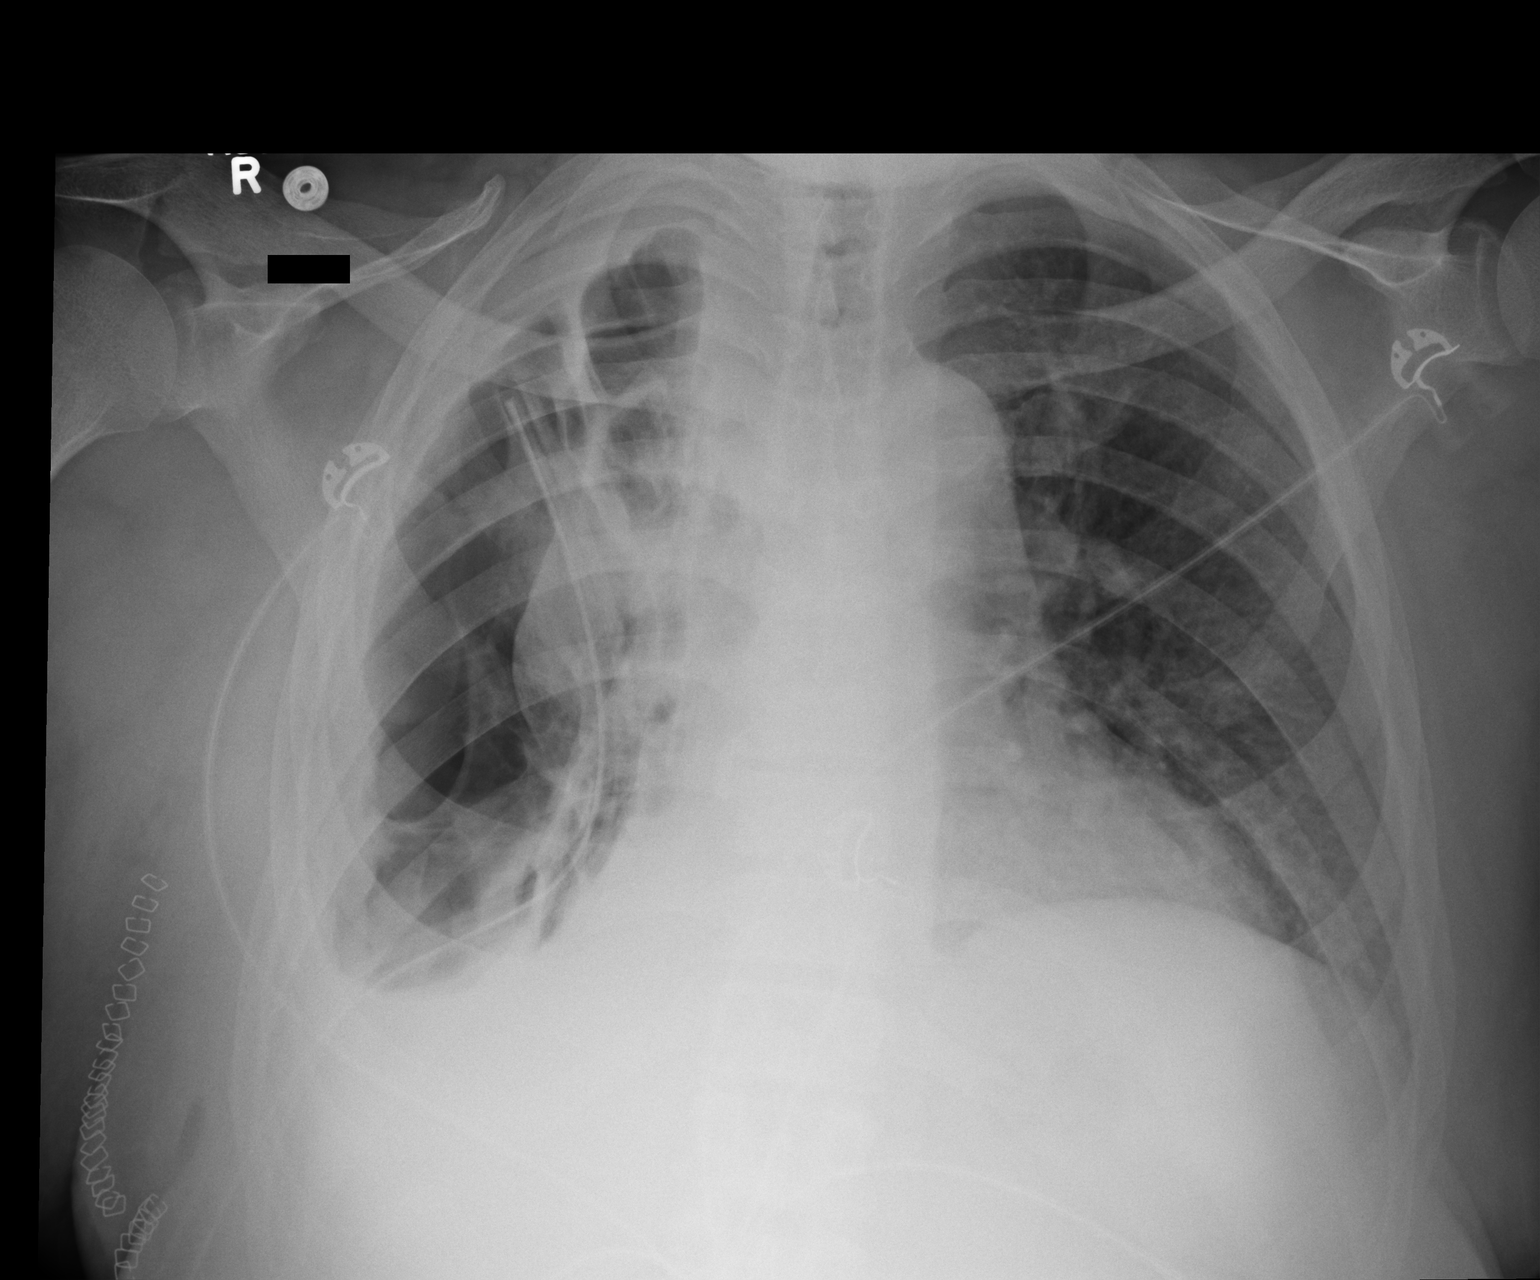

[1 of 1 positions shown; findings below may reference images not displayed]

FINDINGS: Increasing moderate right-sided pneumothorax despite the
presence of the right-sided chest tube which appears unchanged in
position with tip in the upper right hemithorax.  Persistent
moderate volume of right pleural fluid.  Extensive architectural
distortion and atelectasis throughout the right lung which appears
to further collapse when compared with yesterday's examination.
There is some increasing cephalization of the pulmonary vasculature
in the left lung, without frank pulmonary edema.  Heart size is
upper limits of normal.  Atherosclerosis in the thoracic aorta.
Surgical staples project over the right chest wall, there is a
small amount of subcutaneous gas in the right chest wall.
IMPRESSION: 1.  Compared with yesterday's examination there is increasing
moderate right hydropneumothorax and further collapse of the right
lung.  Right-sided chest tube is unchanged in position.
2.  Cephalization of pulmonary vasculature, without frank pulmonary
edema.

to Dr. Ainisa, who verbally acknowledged these results.

## 2014-07-06 IMAGING — CR DG CHEST 1V PORT
1 series · 1 of 1 positions shown · non-contrast
Comparison: Chest x-ray 02/26/2013.

CLINICAL DATA: Evaluate chest tube placement.

PORTABLE CHEST - 1 VIEW

[AP]
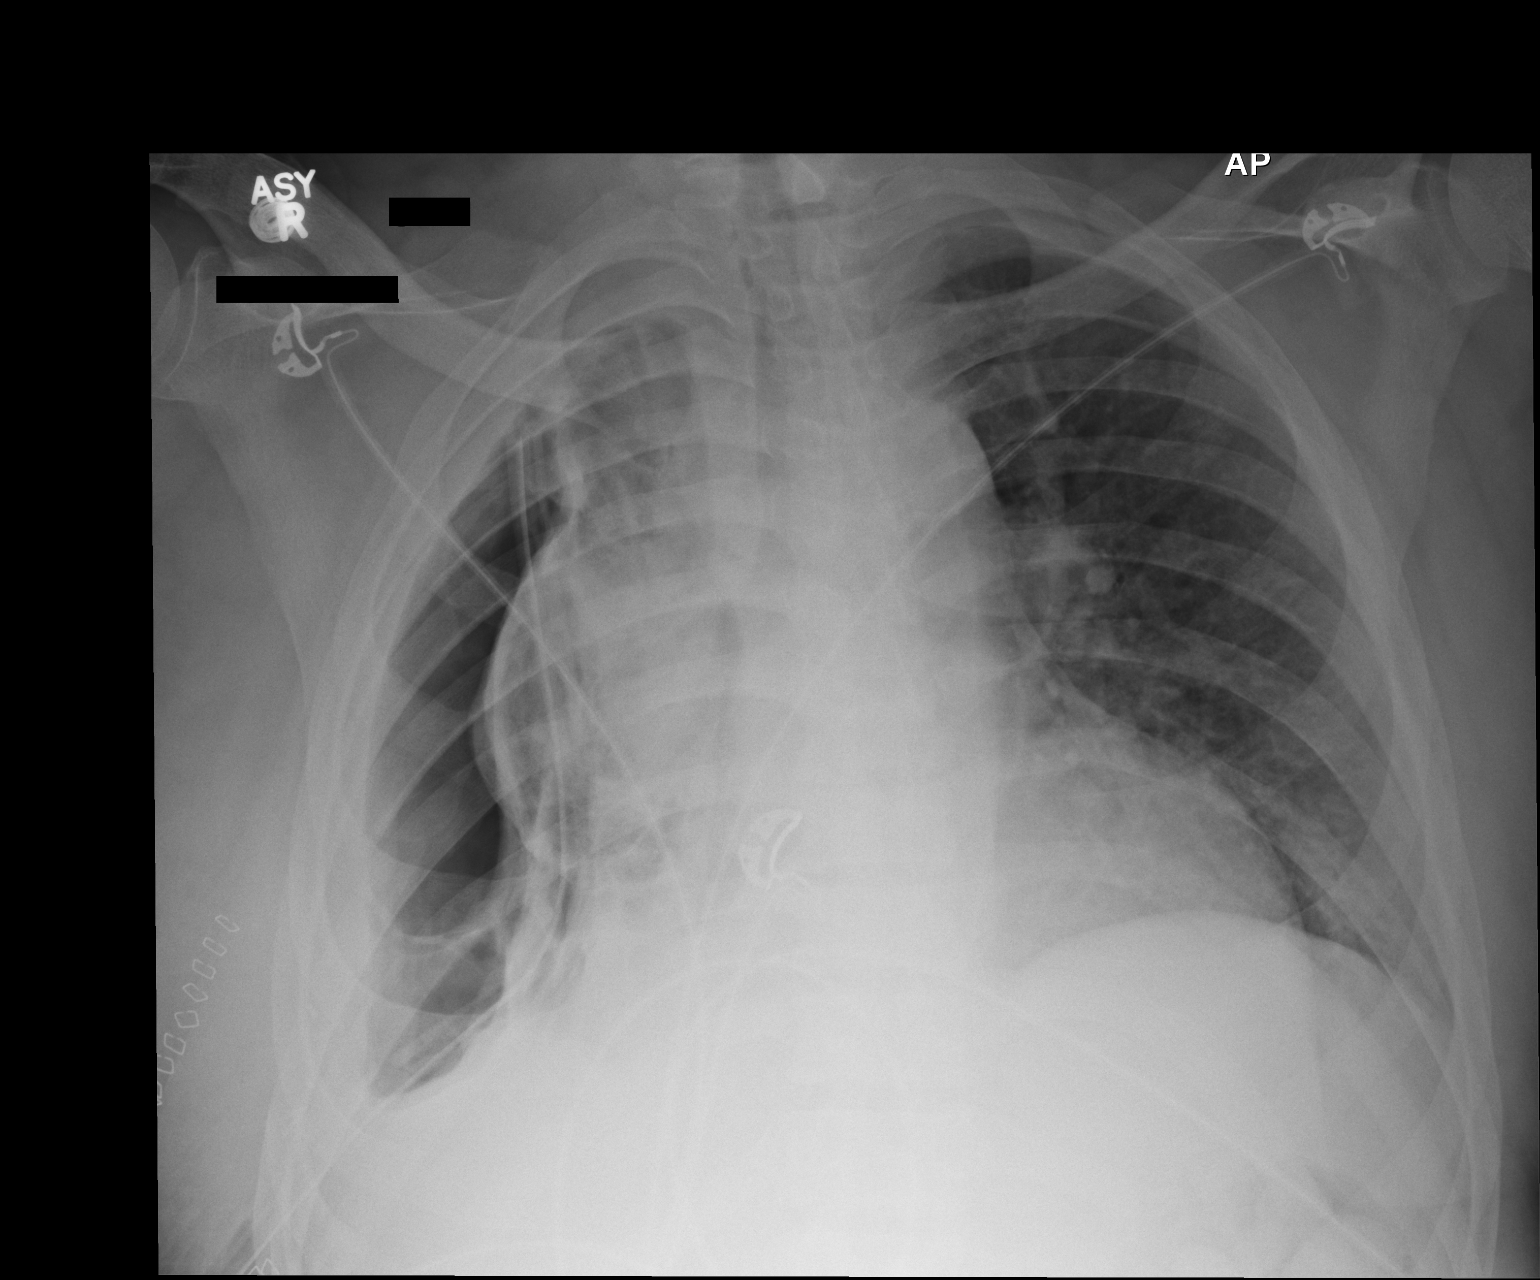

[1 of 1 positions shown; findings below may reference images not displayed]

FINDINGS: A single right-sided chest tube remains in position with
tip near the right apex.  There continues to be a moderate right
sided hydropneumothorax which is similar to slightly decreased in
size compared to yesterday's examination.  Extensive collapse and
architectural distortion is noted throughout the right lung, with
what are likely multiple pleural adhesions.  Left lung is flow-
volume, but otherwise appears well aerated.  No definite left
pleural effusion.  No evidence of pulmonary edema.  Heart size is
within normal limits.  Atherosclerosis in the thoracic aorta.  Skin
staples are seen projecting over the lateral right hemithorax there
is a small amount of subcutaneous emphysema in the right chest
wall.
IMPRESSION: 1.  Allowing for slight differences in patient positioning, the
radiographic appearance of chest is very similar to yesterday's
examination, as detailed above, with slight decreased size of the
moderate right hydropneumothorax.

## 2014-07-07 IMAGING — CR DG CHEST 1V PORT
1 series · 1 of 1 positions shown · non-contrast
Comparison: the previous day's study

CLINICAL DATA: Chest tube

PORTABLE CHEST - 1 VIEW

[AP]
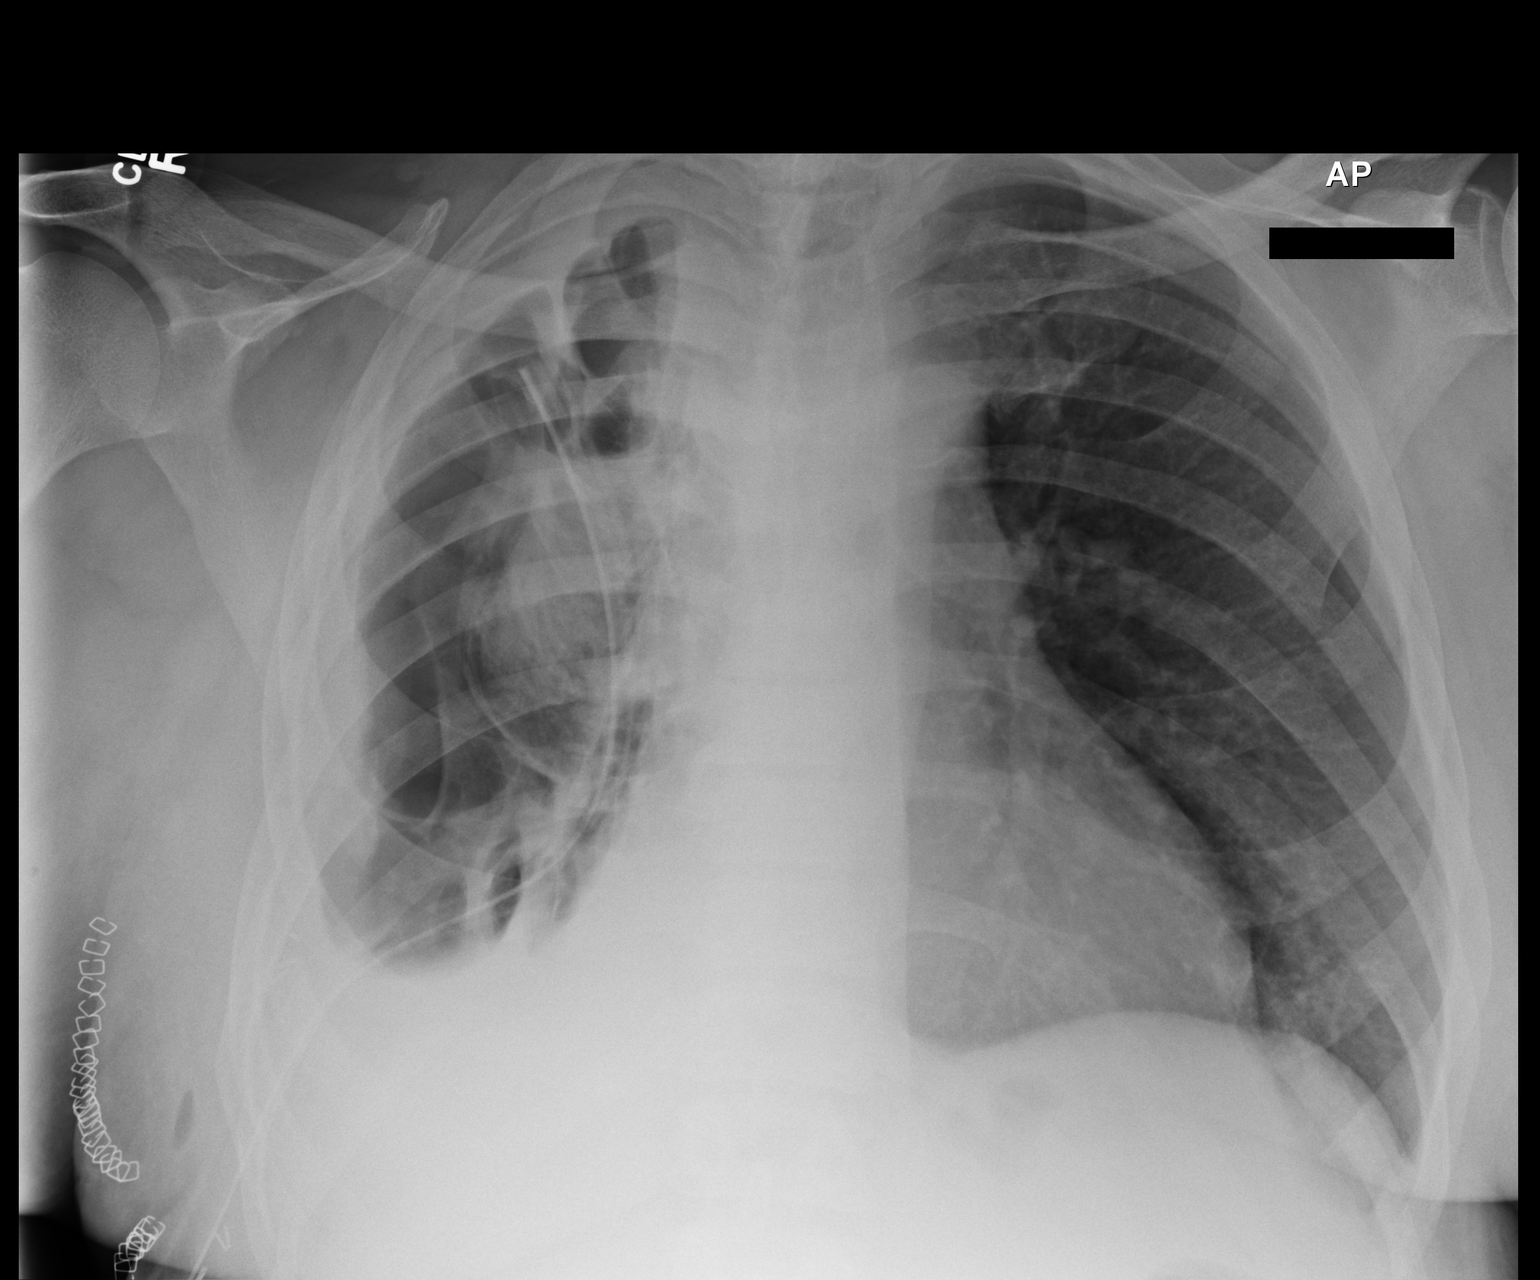

[1 of 1 positions shown; findings below may reference images not displayed]

FINDINGS: Right chest tube remains in place with complex right
probable hydropneumothorax, significant right lung consolidation /
atelectasis about the hilum.  Left lung is clear.  Heart size
within normal limits for technique.
IMPRESSION: 1.  Stable right chest tube with a complex right hydropneumothorax.

## 2014-07-08 IMAGING — CR DG CHEST 1V PORT
1 series · 1 of 1 positions shown · non-contrast
Comparison: the previous day's study

CLINICAL DATA: Chest tube

PORTABLE CHEST - 1 VIEW

[AP]
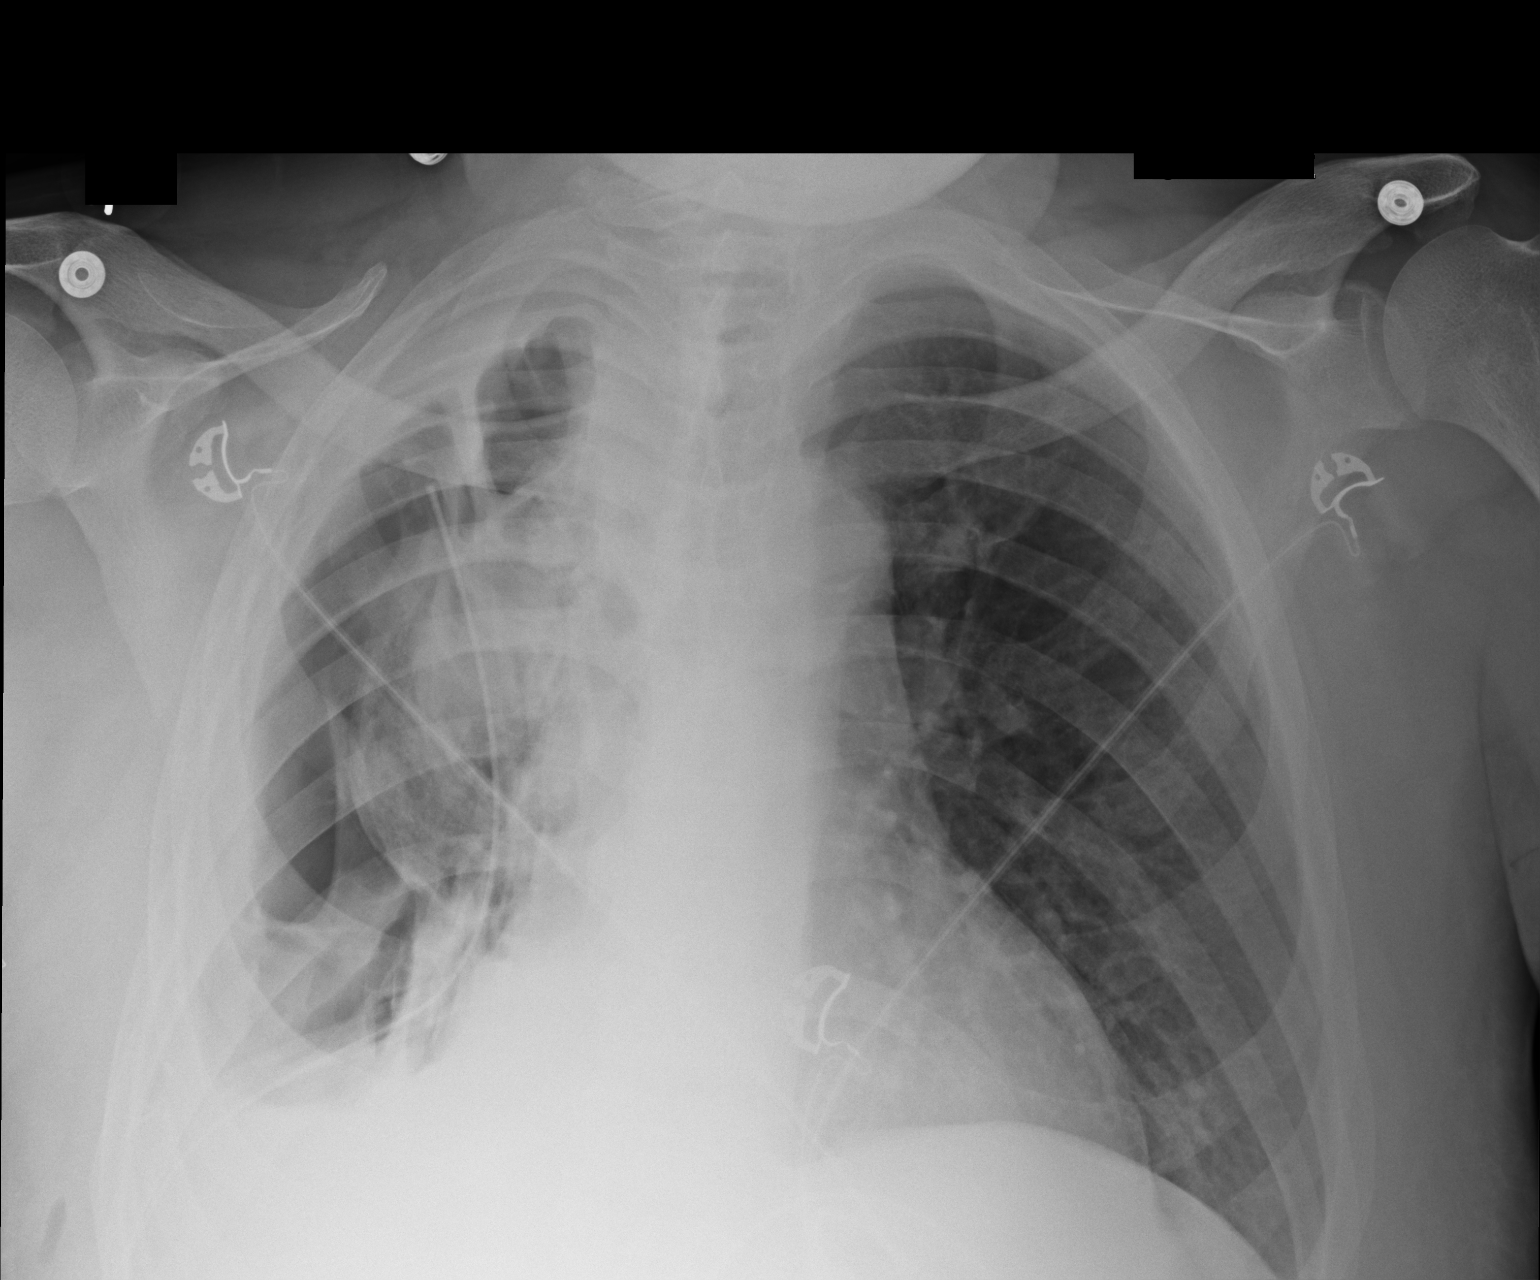

[1 of 1 positions shown; findings below may reference images not displayed]

FINDINGS: Right chest tube stable position.  Probable complex
hydropneumothorax without convincing interval change.  The right
lung is medial and atelectatic.  Left lung clear.  Heart size
within normal limits for technique..
IMPRESSION: 1.  Little change from previous day's portable exam.

## 2014-07-09 IMAGING — CR DG CHEST 1V PORT
1 series · 1 of 1 positions shown · non-contrast
Comparison: 03/01/2013

CLINICAL DATA: Right pneumothorax

PORTABLE CHEST - 1 VIEW

[view not recorded]
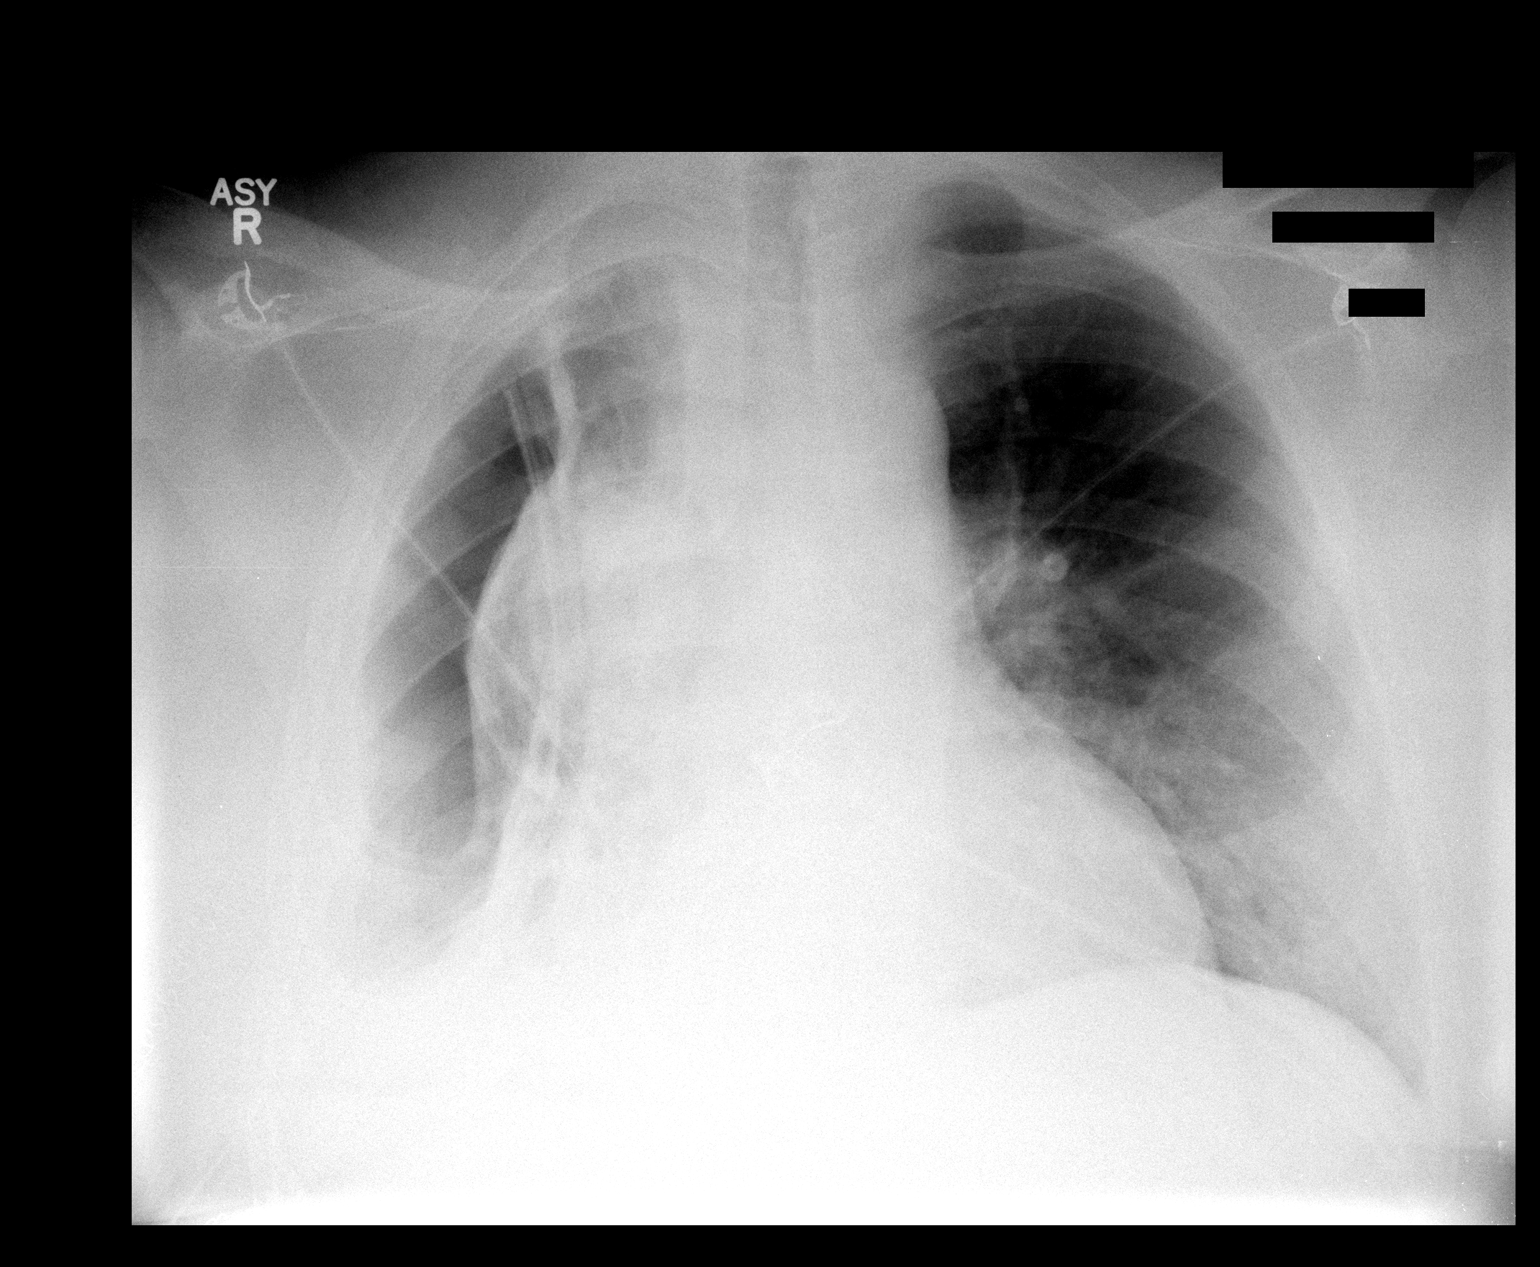

[1 of 1 positions shown; findings below may reference images not displayed]

FINDINGS: The stable appearing right-sided pneumothorax is seen.
Chest tube is noted in place.  The left lung remains clear.  The
cardiac shadow is unremarkable.
IMPRESSION: No change from prior exam.

## 2014-07-10 IMAGING — CR DG CHEST 1V PORT
1 series · 1 of 1 positions shown · non-contrast
Comparison: 03/02/2013; 02/27/2013; 02/23/2013

CLINICAL DATA: Post VATS, right-sided pneumothorax

PORTABLE CHEST - 1 VIEW

[AP]
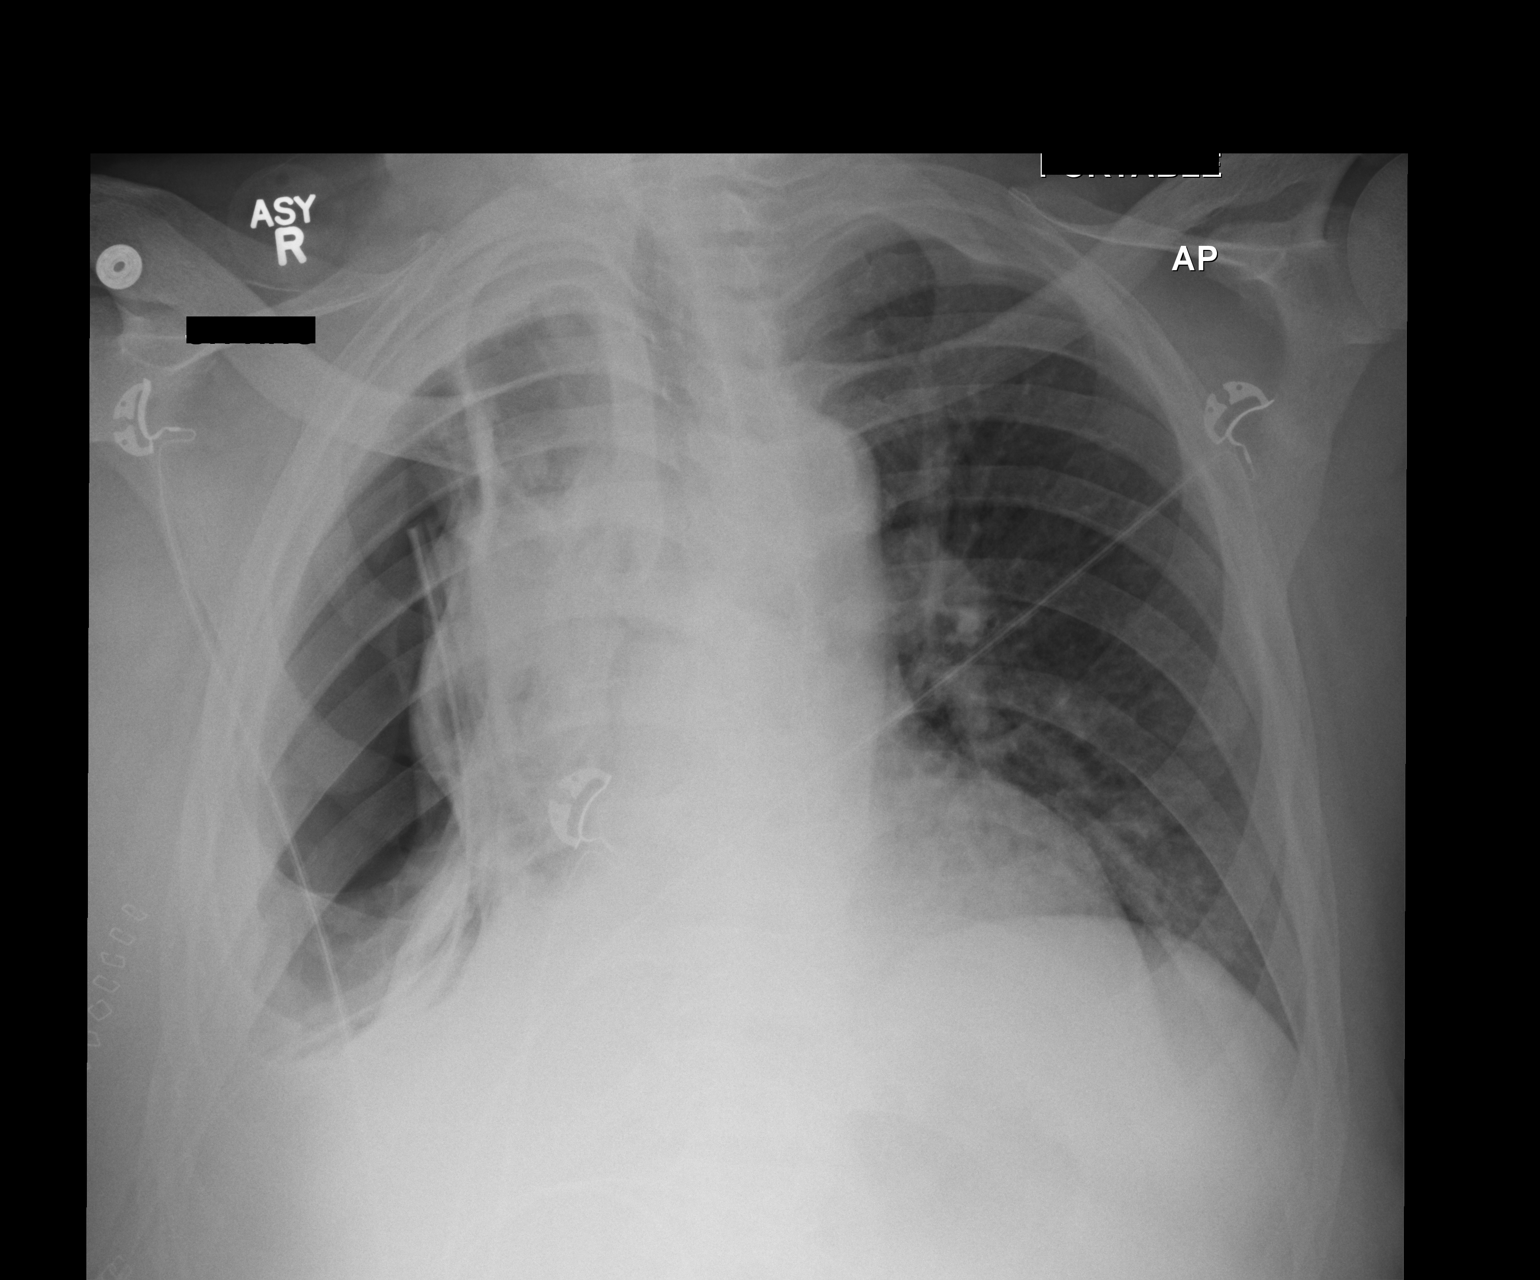

[1 of 1 positions shown; findings below may reference images not displayed]

FINDINGS: Grossly unchanged cardiac silhouette and mediastinal
contours with obscuration of the right heart border secondary to
right mid and lower lung heterogeneous / consolidative opacities.
Grossly unchanged appearance of laterally positioned right-sided
small to moderate-sized hydropneumothorax.  Stable positioning of
right-sided chest tube.  The left hemithorax is unchanged.
Unchanged bones.
IMPRESSION: Stable position of support apparatus.  Grossly unchanged small to
moderate-sized right-sided hydropneumothorax.

## 2014-07-11 IMAGING — CR DG CHEST 1V PORT
1 series · 1 of 1 positions shown · non-contrast
Comparison: 03/03/2013, and multiple recent priors dating back to
12/12/2012.  Chest CT 12/12/2012 and 01/08/2013.

CLINICAL DATA: Right pneumothorax

PORTABLE CHEST - 1 VIEW

[AP]
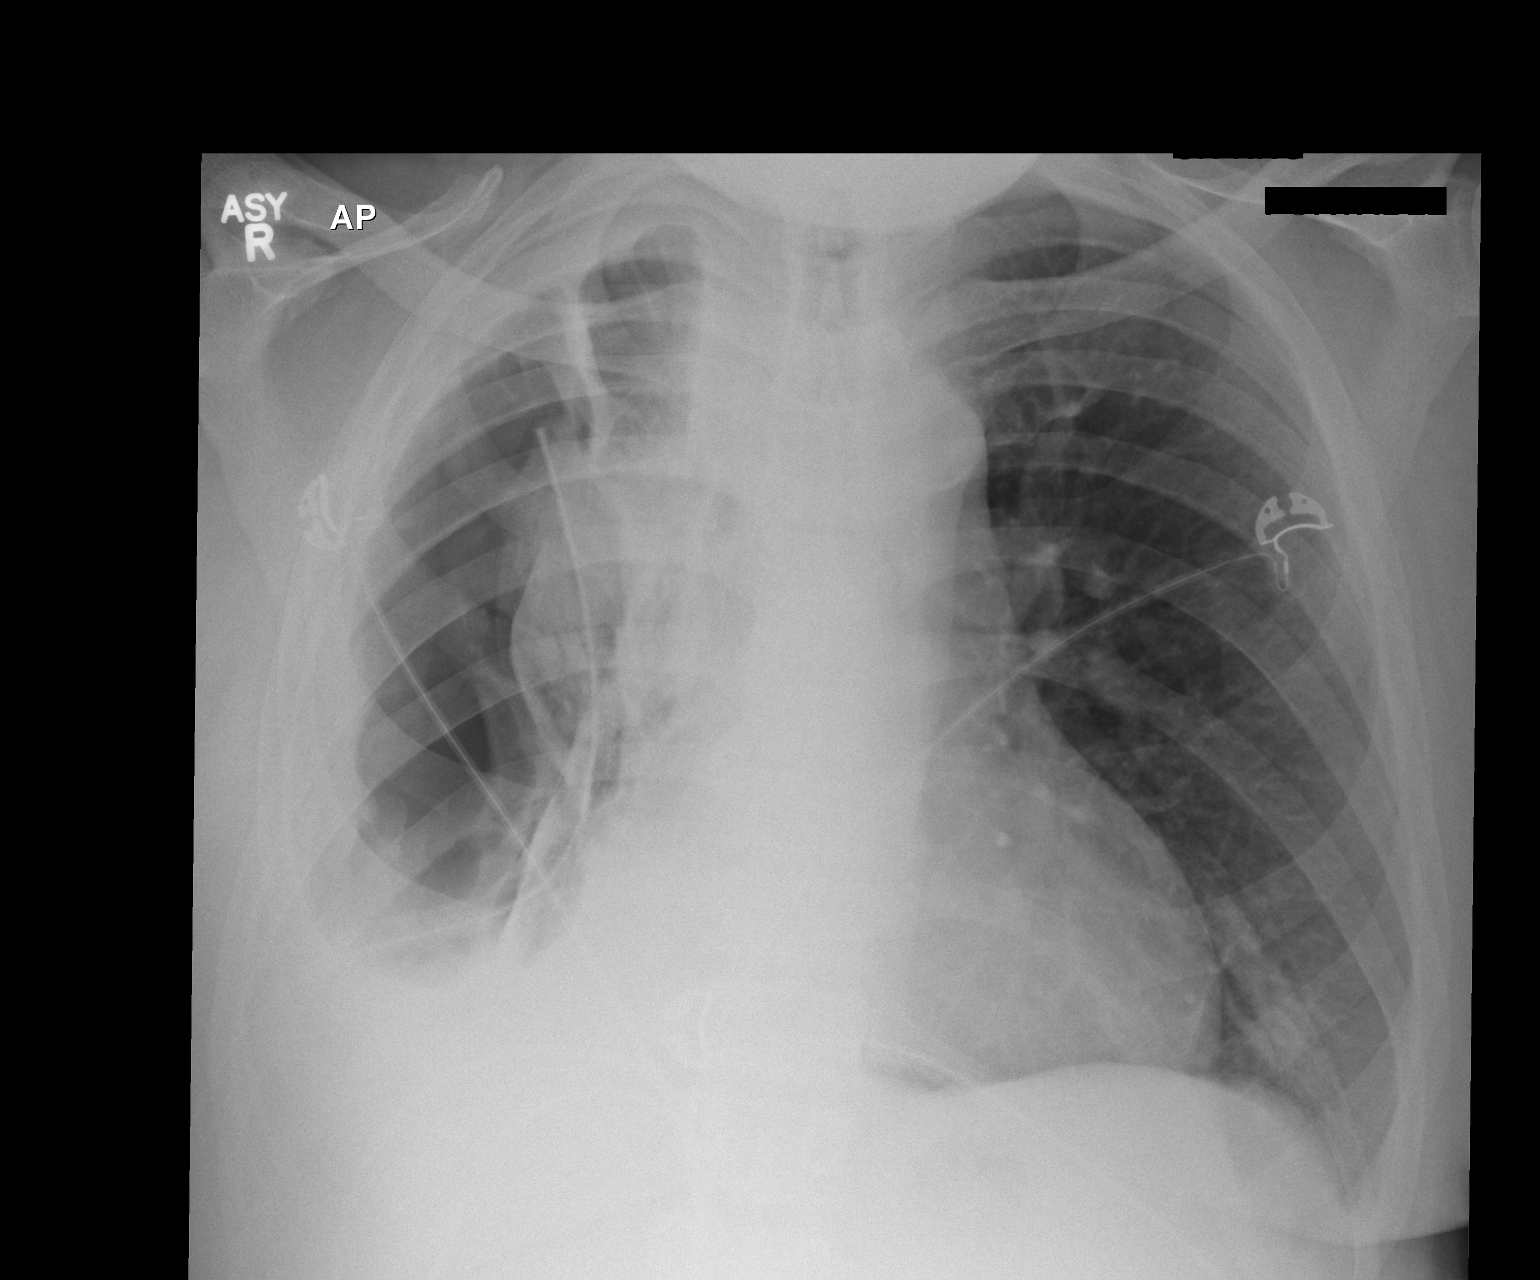

[1 of 1 positions shown; findings below may reference images not displayed]

FINDINGS: The right-sided chest tube is unchanged. Stable heart and
mediastinal contours.  There is a stable moderate-sized right
hydropneumothorax.  Right paramediastinal and right basilar
opacities could reflect atelectasis and/or consolidation.

There is mild interstitial prominence in the left lung.  Small
focal opacity at the lateral left lung base could reflect
atelectasis or airspace disease.  No definite pleural effusion on
the left.
IMPRESSION: 1.  Stable position of right-sided chest tube and unchanged
moderate right hydropneumothorax and right basilar and
paramediastinal opacities.
2. Slightly decreased aeration of the left lung base could reflect
atelectasis or early airspace disease.

## 2014-07-12 IMAGING — CR DG CHEST 1V PORT
1 series · 1 of 1 positions shown · non-contrast
Comparison: 03/04/2013

CLINICAL DATA: Status post right VATS surgery.

PORTABLE CHEST - 1 VIEW

[AP]
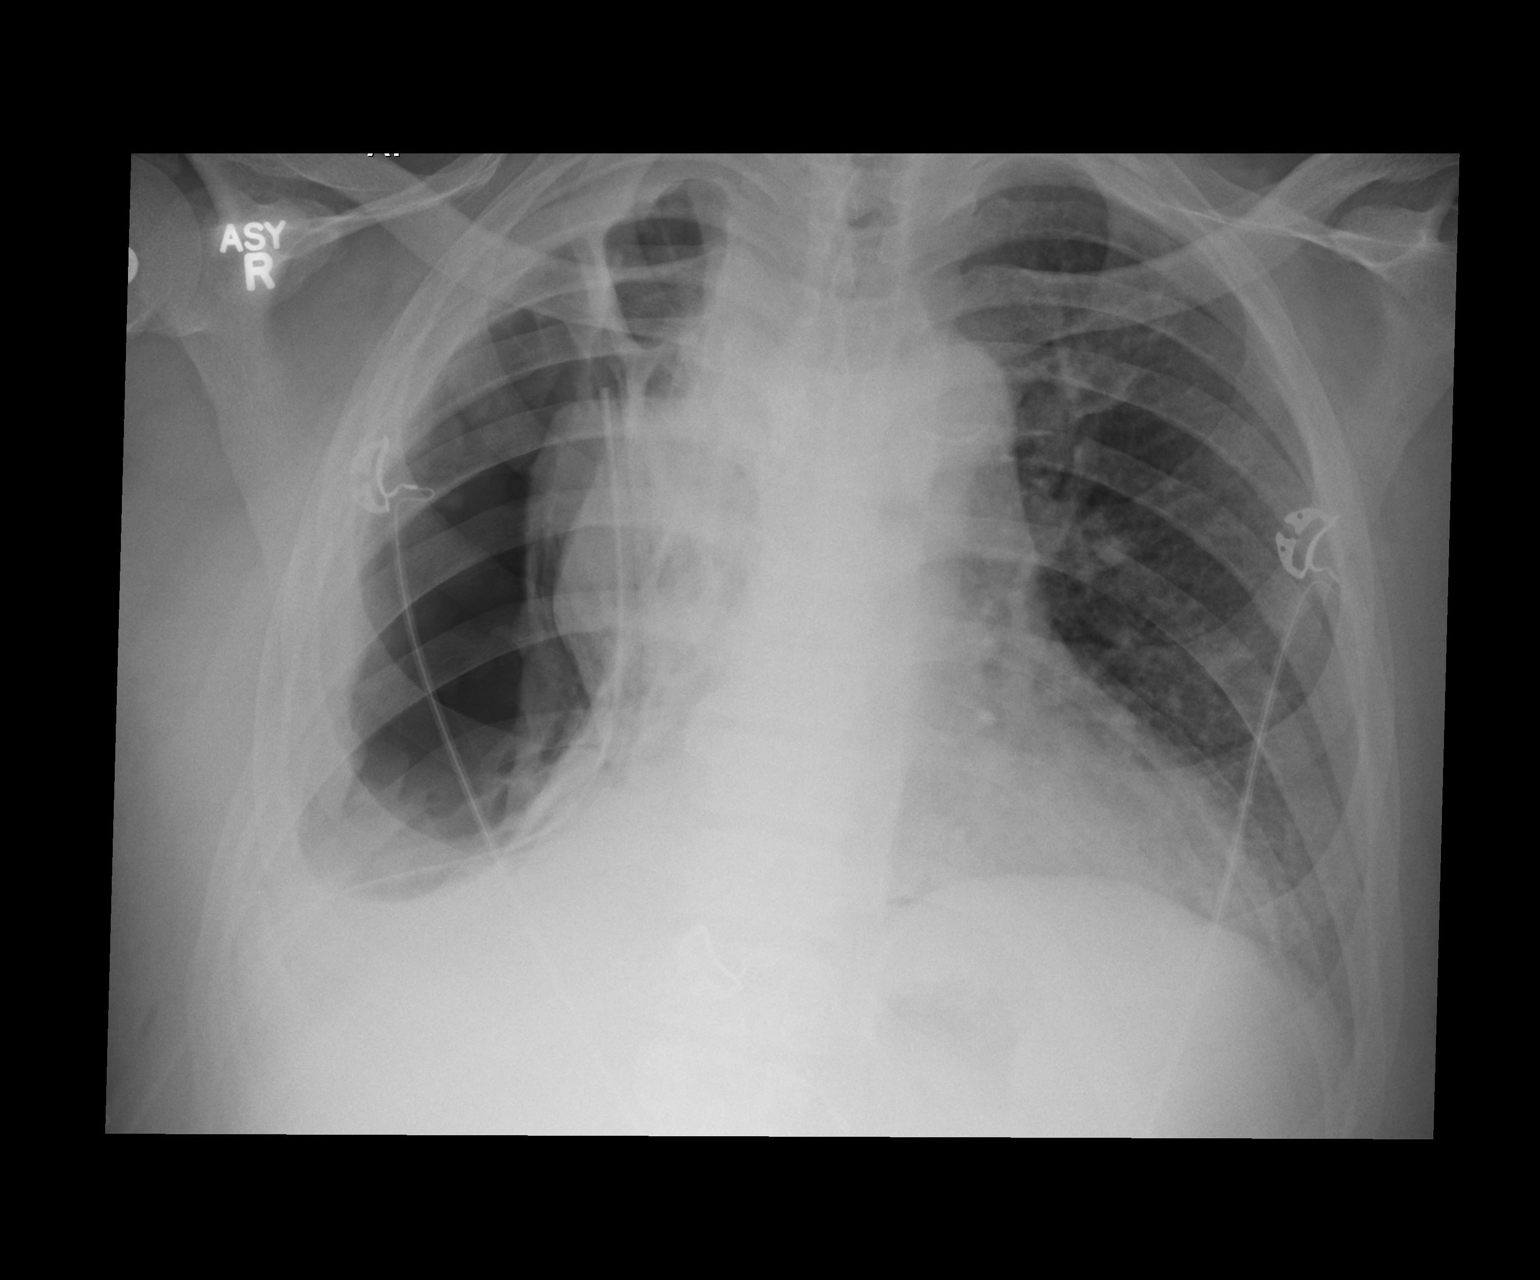

[1 of 1 positions shown; findings below may reference images not displayed]

FINDINGS: Overall aeration of the right lung with a single chest
tube remaining is stable with persistent lack of complete expansion
of the right lung.  Ex vacuo air in the right pleural space is
stable.  The left lung shows slightly lower volume with mild
basilar atelectasis.
IMPRESSION: Stable appearance of lack of complete re-expansion of the right
lung with stable ex vacuo air in the right pleural space.

## 2014-07-13 IMAGING — CT CT CHEST W/O CM
2 of 3 series · 14 of 46 positions shown, 16 images · IV contrast (APPLIED)
Comparison: 01/08/2013

CLINICAL DATA: Left pneumothorax  .

CHEST CT WITHOUT CONTRAST
TECHNIQUE: Multidetector CT imaging of the chest was performed
following the standard protocol without IV contrast.,Technique:
Multidetector CT imaging of the abdomen was performed following the
standard protocol without IV contrast.
TECHNIQUE: Multidetector CT imaging of the abdomen and pelvis was
performed following the standard protocol without intravenous
contrast.

[Series 2: c/a/p 5.0 b31f · axial · 0.67mm/px · z∈[+991,+1541]mm · 11 of 128 slices shown, 13 images]
[im 9/128  soft-tissue]
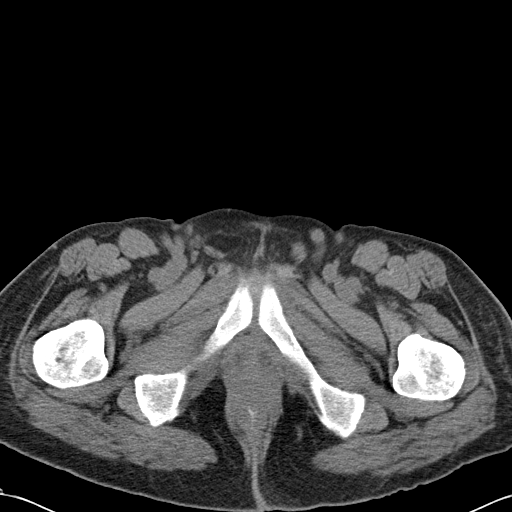
[im 9/128  bone]
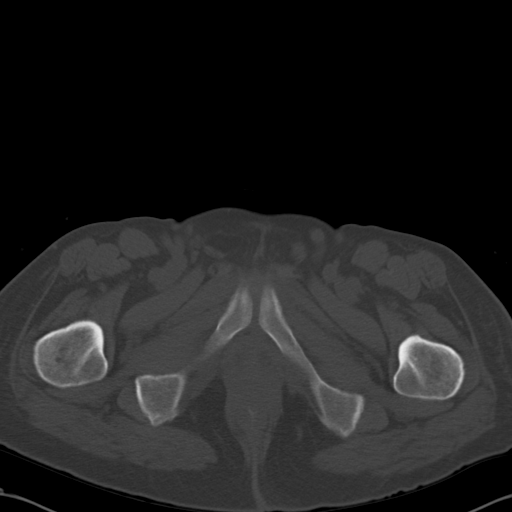
[im 21/128  soft-tissue]
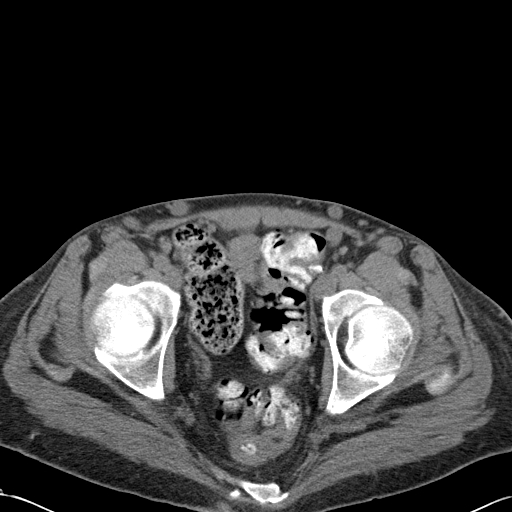
[im 29/128  soft-tissue]
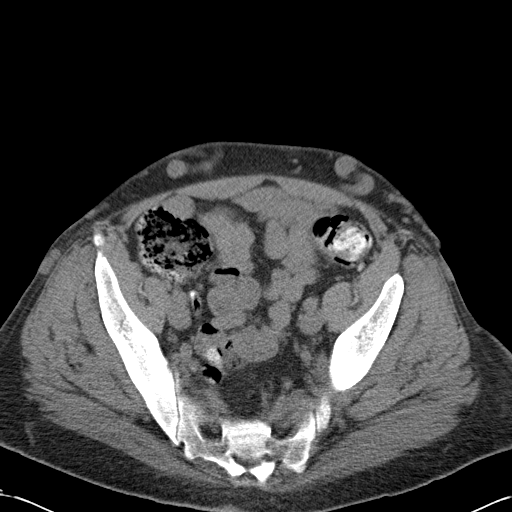
[im 41/128  soft-tissue]
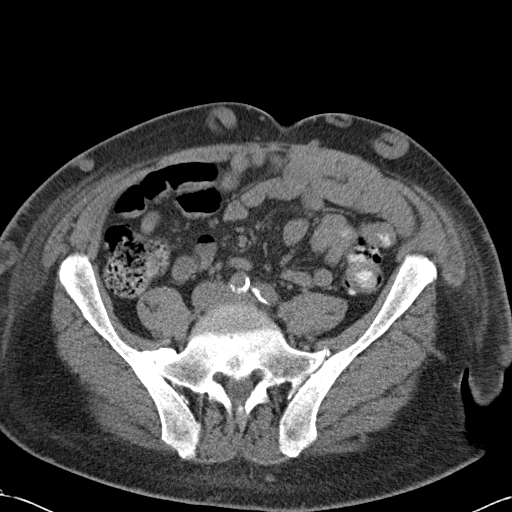
[im 54/128  soft-tissue]
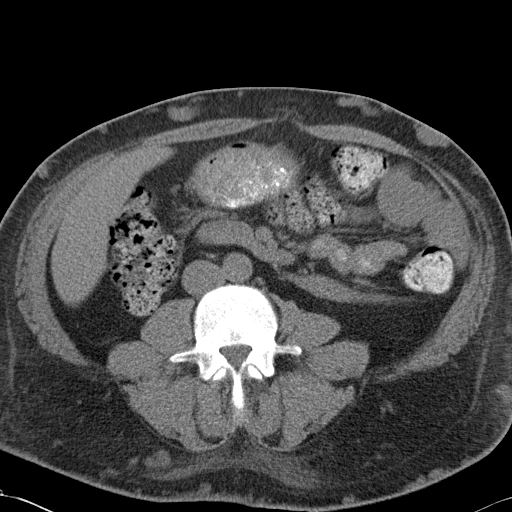
[im 66/128  soft-tissue]
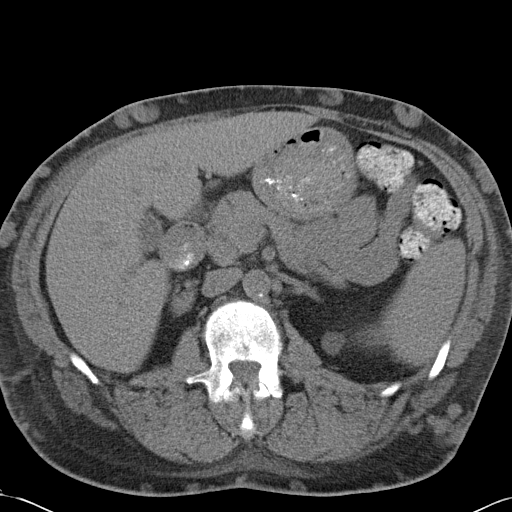
[im 74/128  soft-tissue]
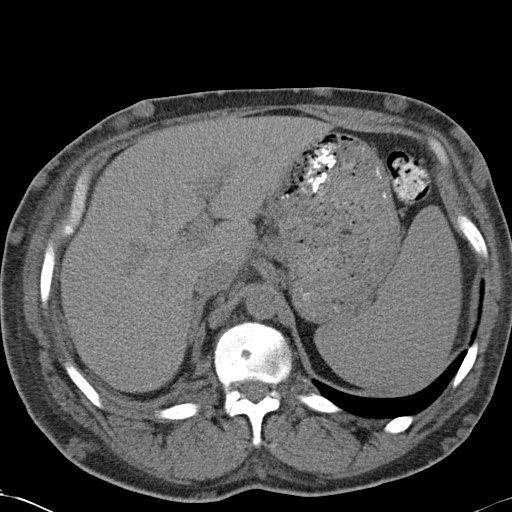
[im 87/128  soft-tissue]
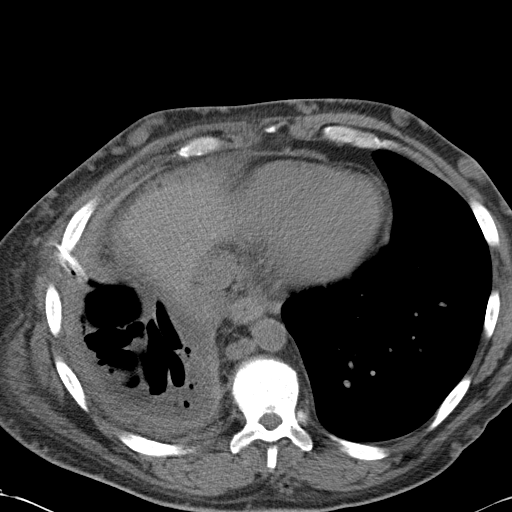
[im 99/128  soft-tissue]
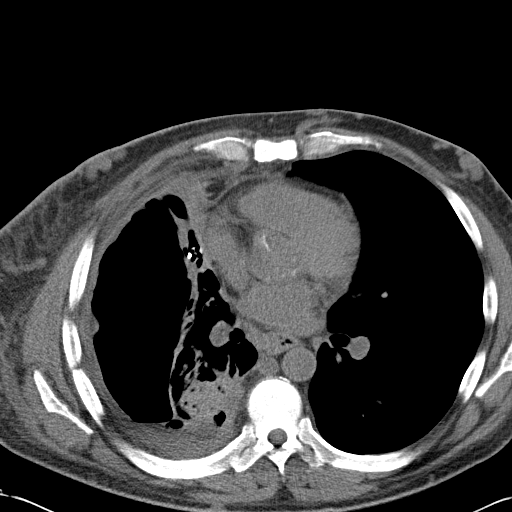
[im 99/128  bone]
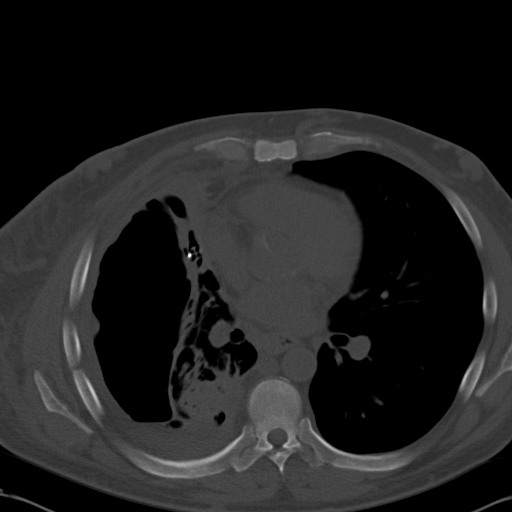
[im 107/128  soft-tissue]
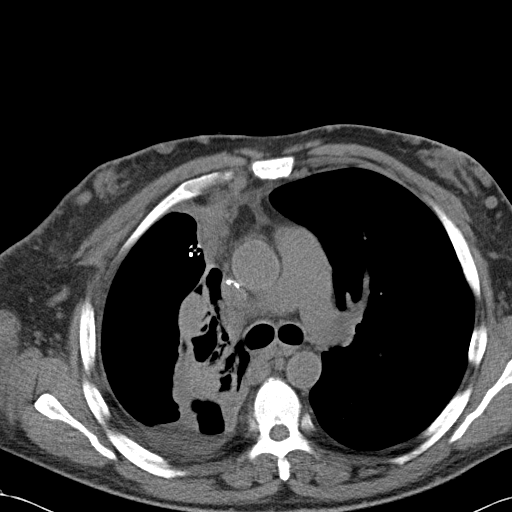
[im 119/128  soft-tissue]
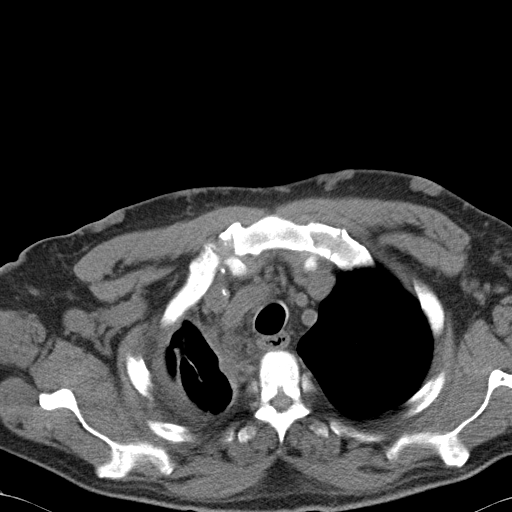

[Series 5: coronals · coronal · 0.71mm/px · 3 of 118 slices shown]
[im 40/118  soft-tissue]
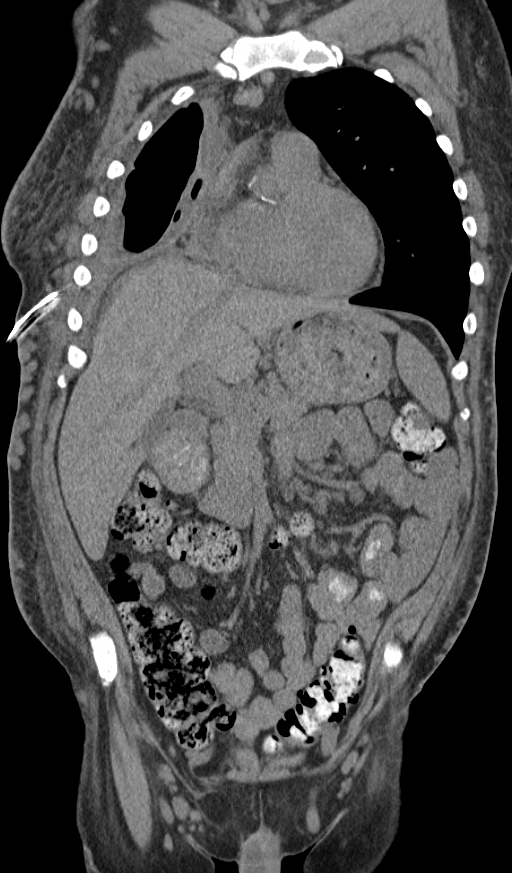
[im 53/118  soft-tissue]
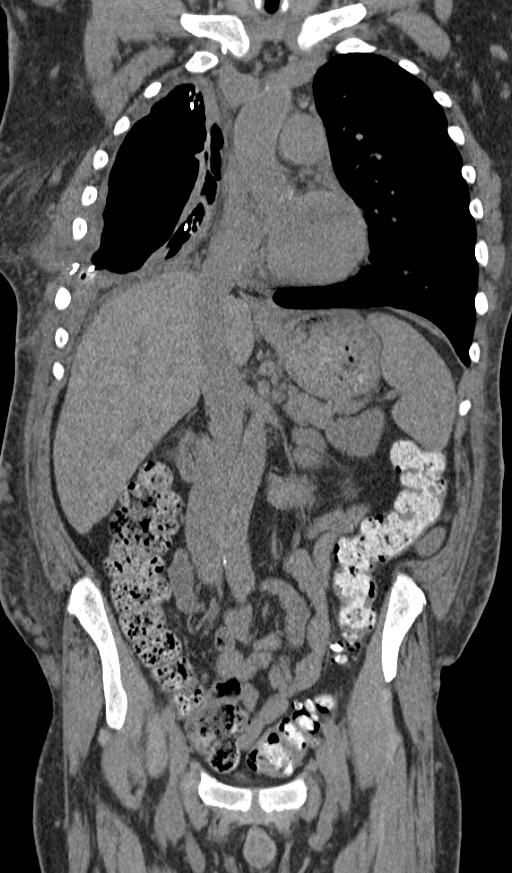
[im 66/118  soft-tissue]
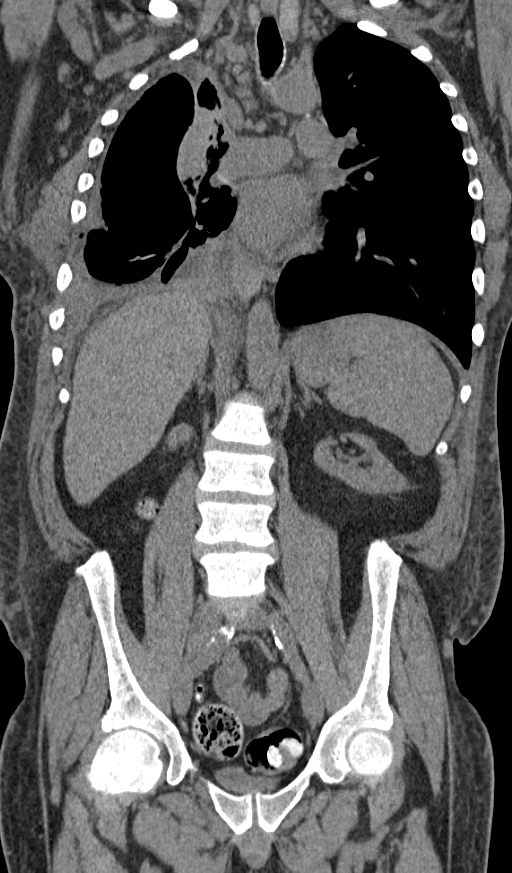

[14 of 46 positions shown; findings below may reference images not displayed]

FINDINGS: Subcutaneous vascular collaterals are seen in the
anterior chest wall.  Numerous tiny lymph nodes are noted in both
axillary regions.  Mediastinal lymphadenopathy persists.  The index
14 mm right paratracheal node measured on the previous study is now
10 mm in the same short axis.

Lung windows show persistent central collapse of the right lung
which has progressed further in the right lower lobe in the
interval.  There is irregular circumferential pleural thickening in
the right hemithorax and the loculations are now visible in the
posterior and lower aspect of the right pleural space.  The right
chest tube is positioned with its tip in the anterior right apex of
the pleural space.

There is edema in the right chest wall with a small amount of right
thoracic wall subcutaneous emphysema.

No focal abnormalities seen in the left lung.

Bone windows reveal no worrisome lytic or sclerotic osseous
lesions.
IMPRESSION: No improvement in the right-sided hydropneumothorax with further
collapse of the right lower lobe and interval development of
loculations within the posterior right pleural space.

CT ABDOMEN AND PELVIS WITHOUT CONTRAST
FINDINGS: No focal abnormalities seen in the liver or spleen on
this study performed without intravenous contrast material.  The
liver measures 21.8 cm in cranial caudal length, enlarged.  The
stomach is distended with food.  Duodenum, pancreas, gallbladder,
and adrenal glands are unremarkable.  Horseshoe kidney noted with
very diminutive bilateral renal one views.  Multiple cysts are seen
in the left moiety.  Midline ventral hernia just cranial to the
umbilicus contains only fat.  There is no abdominal aortic
aneurysm.  No free fluid or lymphadenopathy in the abdomen.

Imaging through the pelvis shows no free intraperitoneal fluid.  No
pelvic sidewall lymphadenopathy.  Diverticular changes are seen in
the left colon without diverticulitis.  The terminal ileum and the
appendix are normal.

Bone windows reveal no worrisome lytic or sclerotic osseous
lesions.
IMPRESSION: No acute findings in the abdomen or pelvis.

## 2014-07-17 IMAGING — CR DG CHEST 2V
2 series · 2 of 2 positions shown · non-contrast
Comparison: Chest x-ray of 03/05/2013 and CT chest of 03/06/2013

CLINICAL DATA: Post VATS and thoracotomy, shortness of breath

CHEST - 2 VIEW

[view not recorded (1 of 2)]
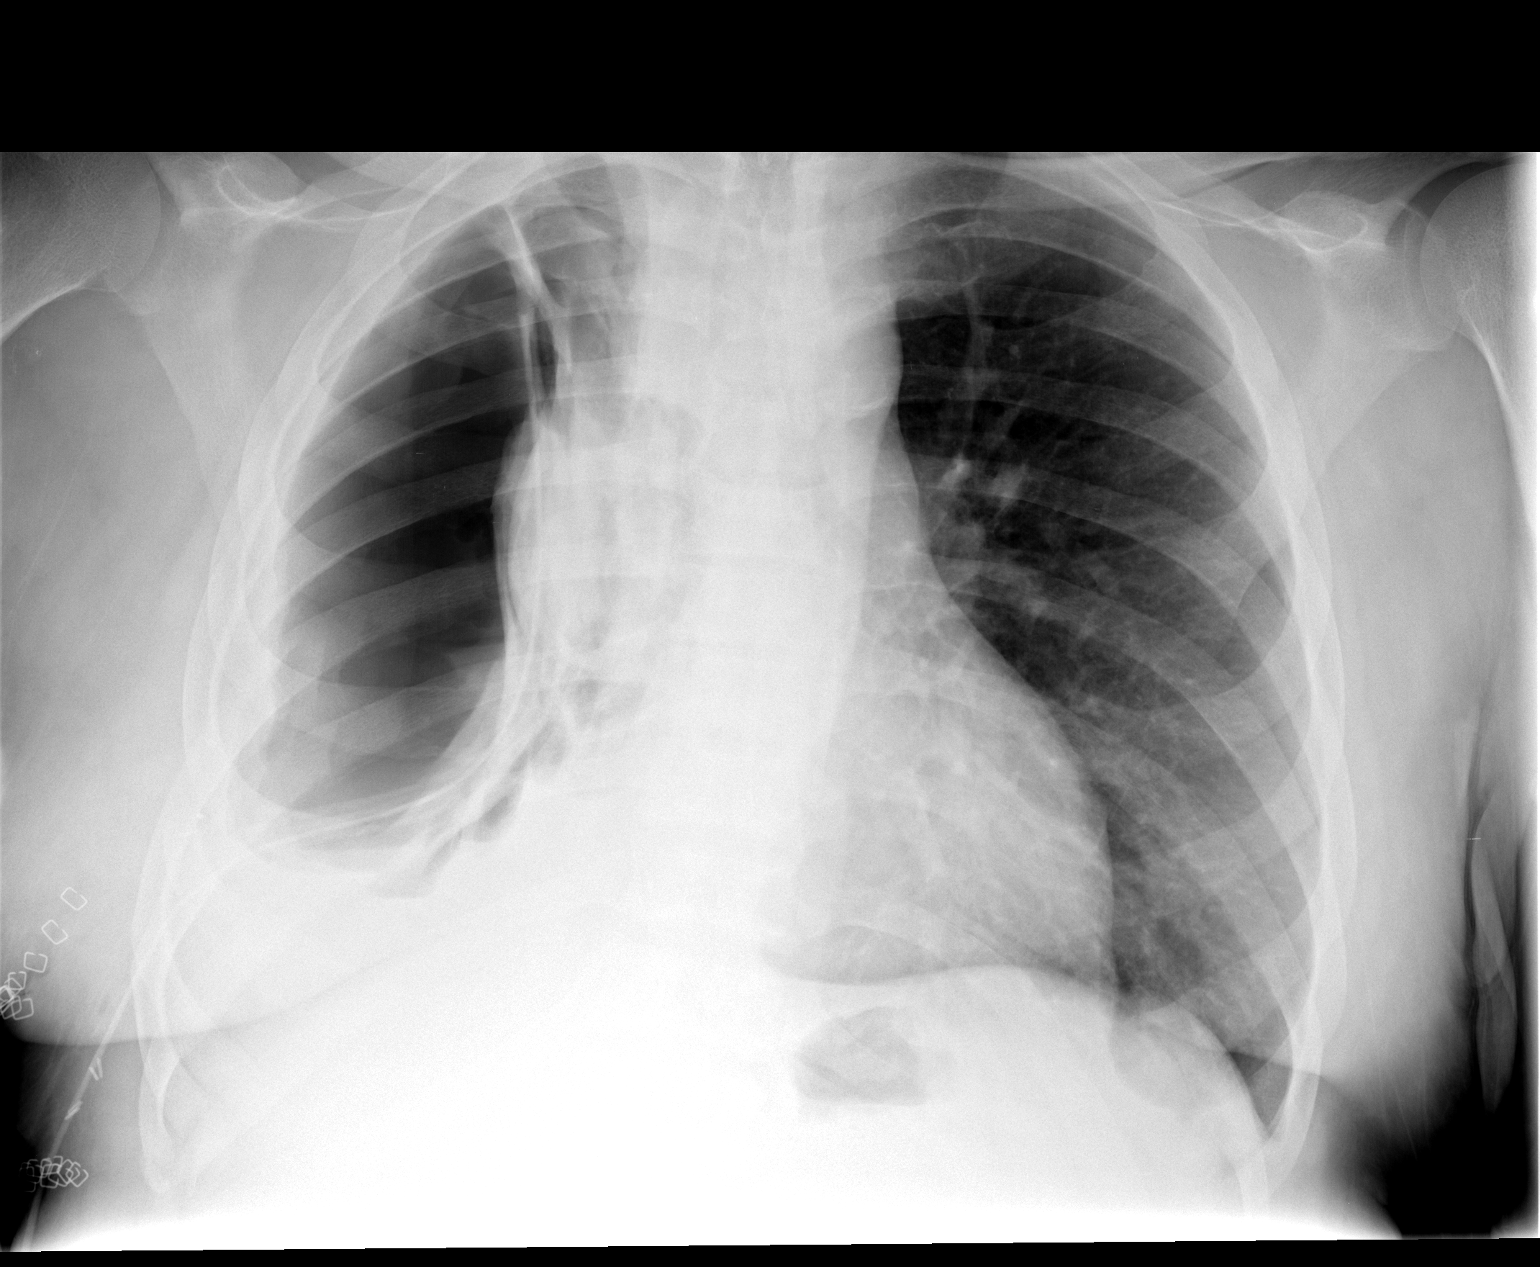

[view not recorded (2 of 2)]
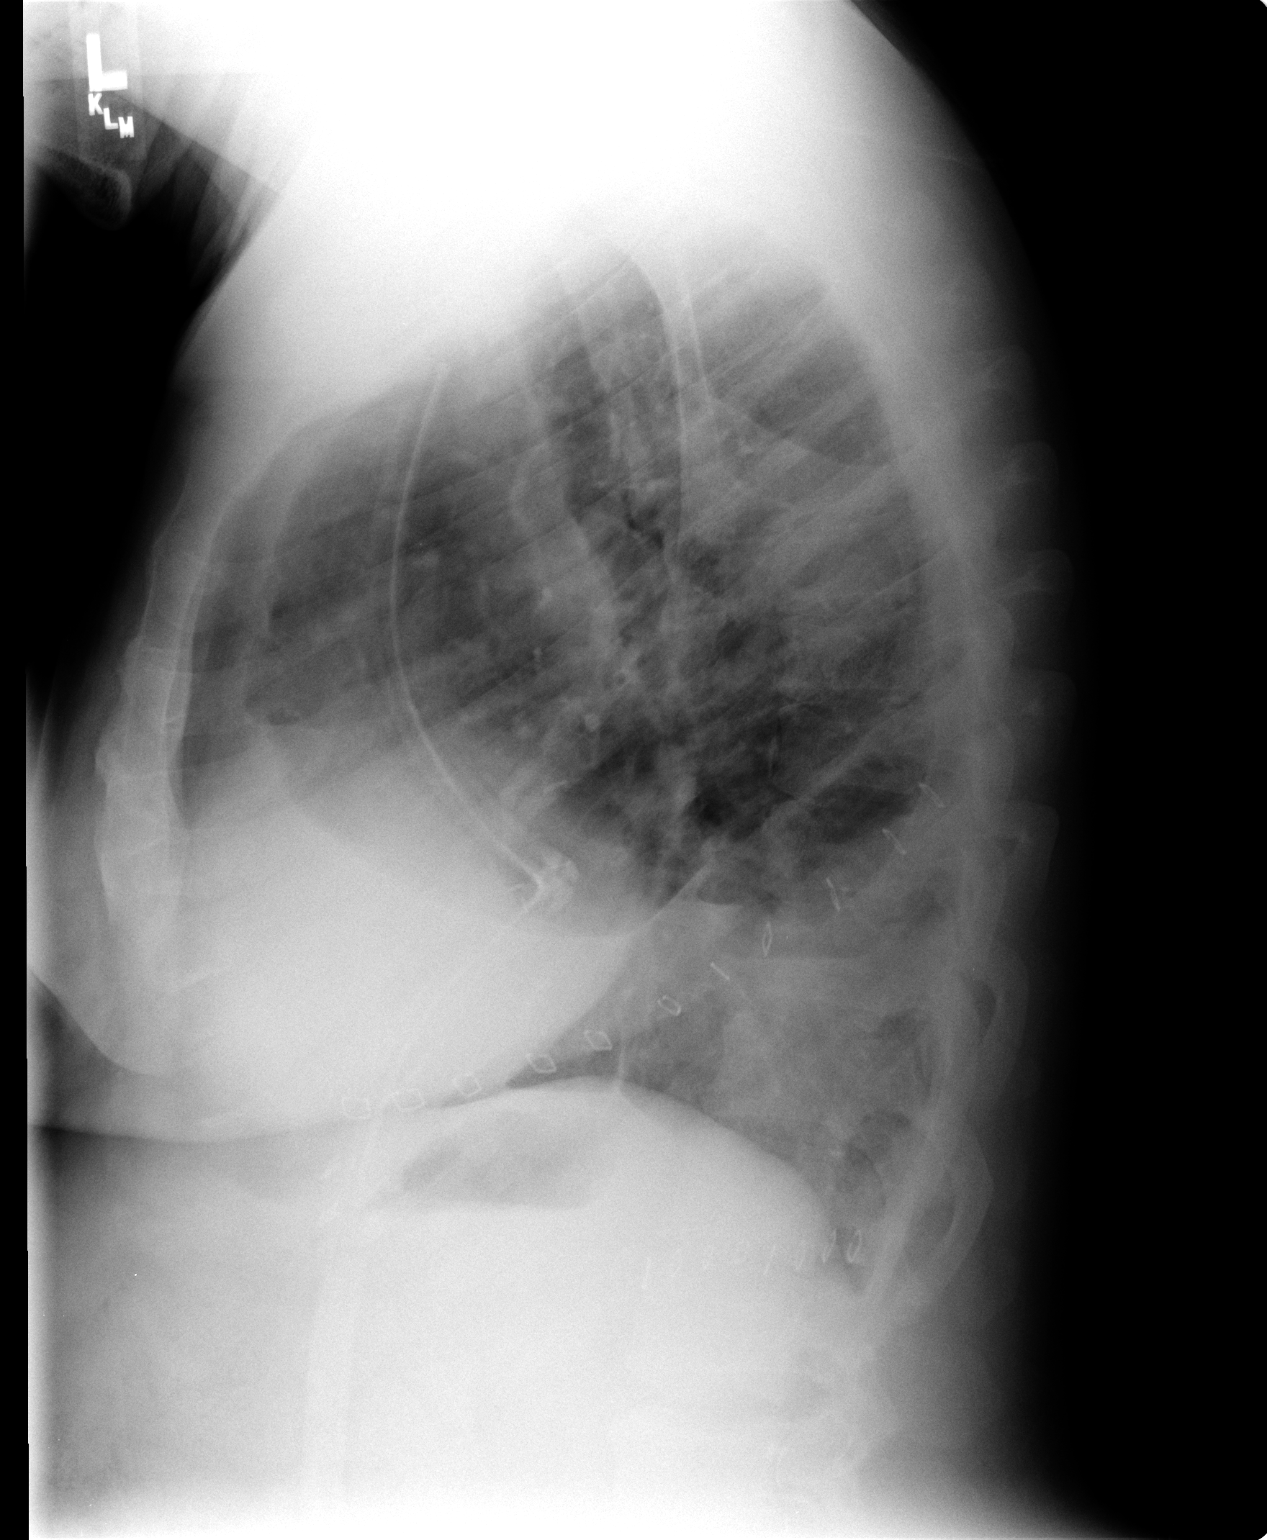

[2 of 2 positions shown; findings below may reference images not displayed]

FINDINGS: There appears to be further collapse of the right lung
medially with no aerated right lung noted.  Air-fluid levels are
noted at the right lung base, and a right chest tube remains.  The
left lung is clear.  Heart size is stable.
IMPRESSION: Further collapse of the right lung with right hydropneumothorax and
multiple air-fluid levels at the right lung base.  No change in
right chest tube.

## 2014-07-24 IMAGING — CR DG CHEST 2V
2 series · 2 of 2 positions shown · non-contrast
Comparison: March 10, 2013.

CLINICAL DATA: Right pleural effusion and hydropneumothorax

CHEST - 2 VIEW

[view not recorded (1 of 2)]
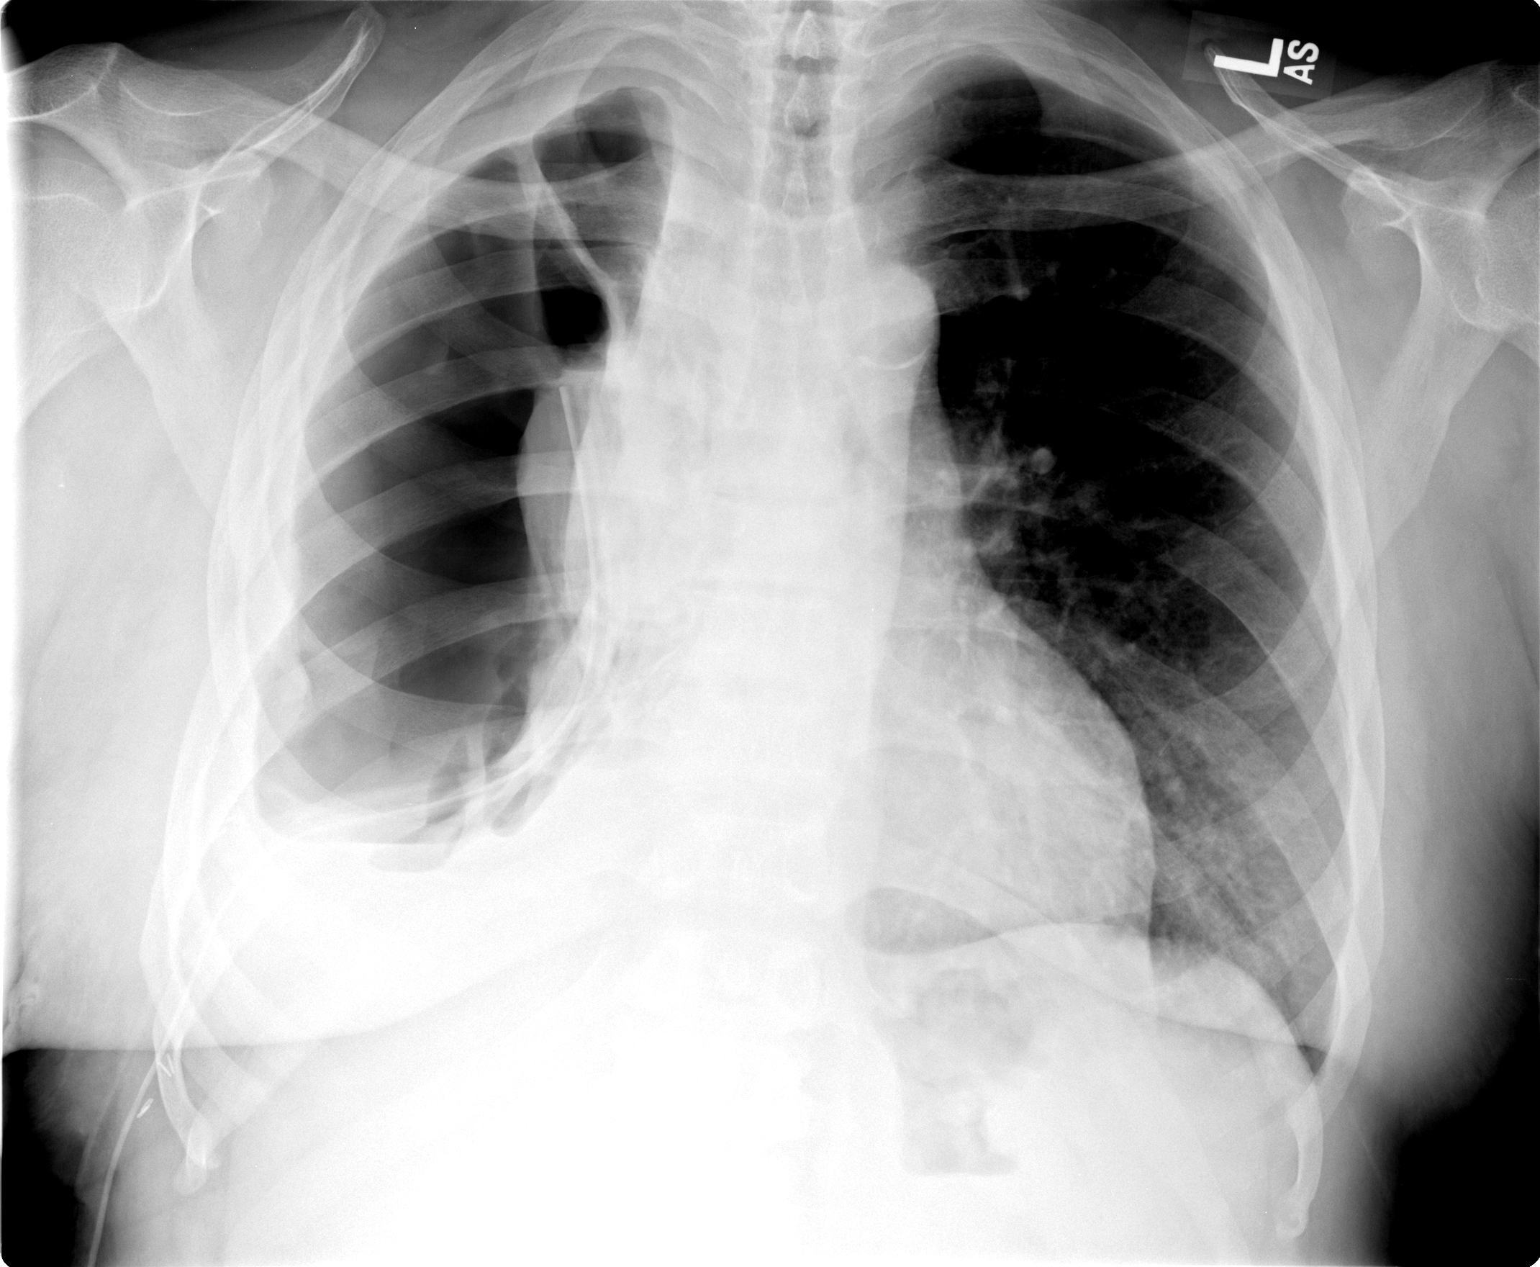

[view not recorded (2 of 2)]
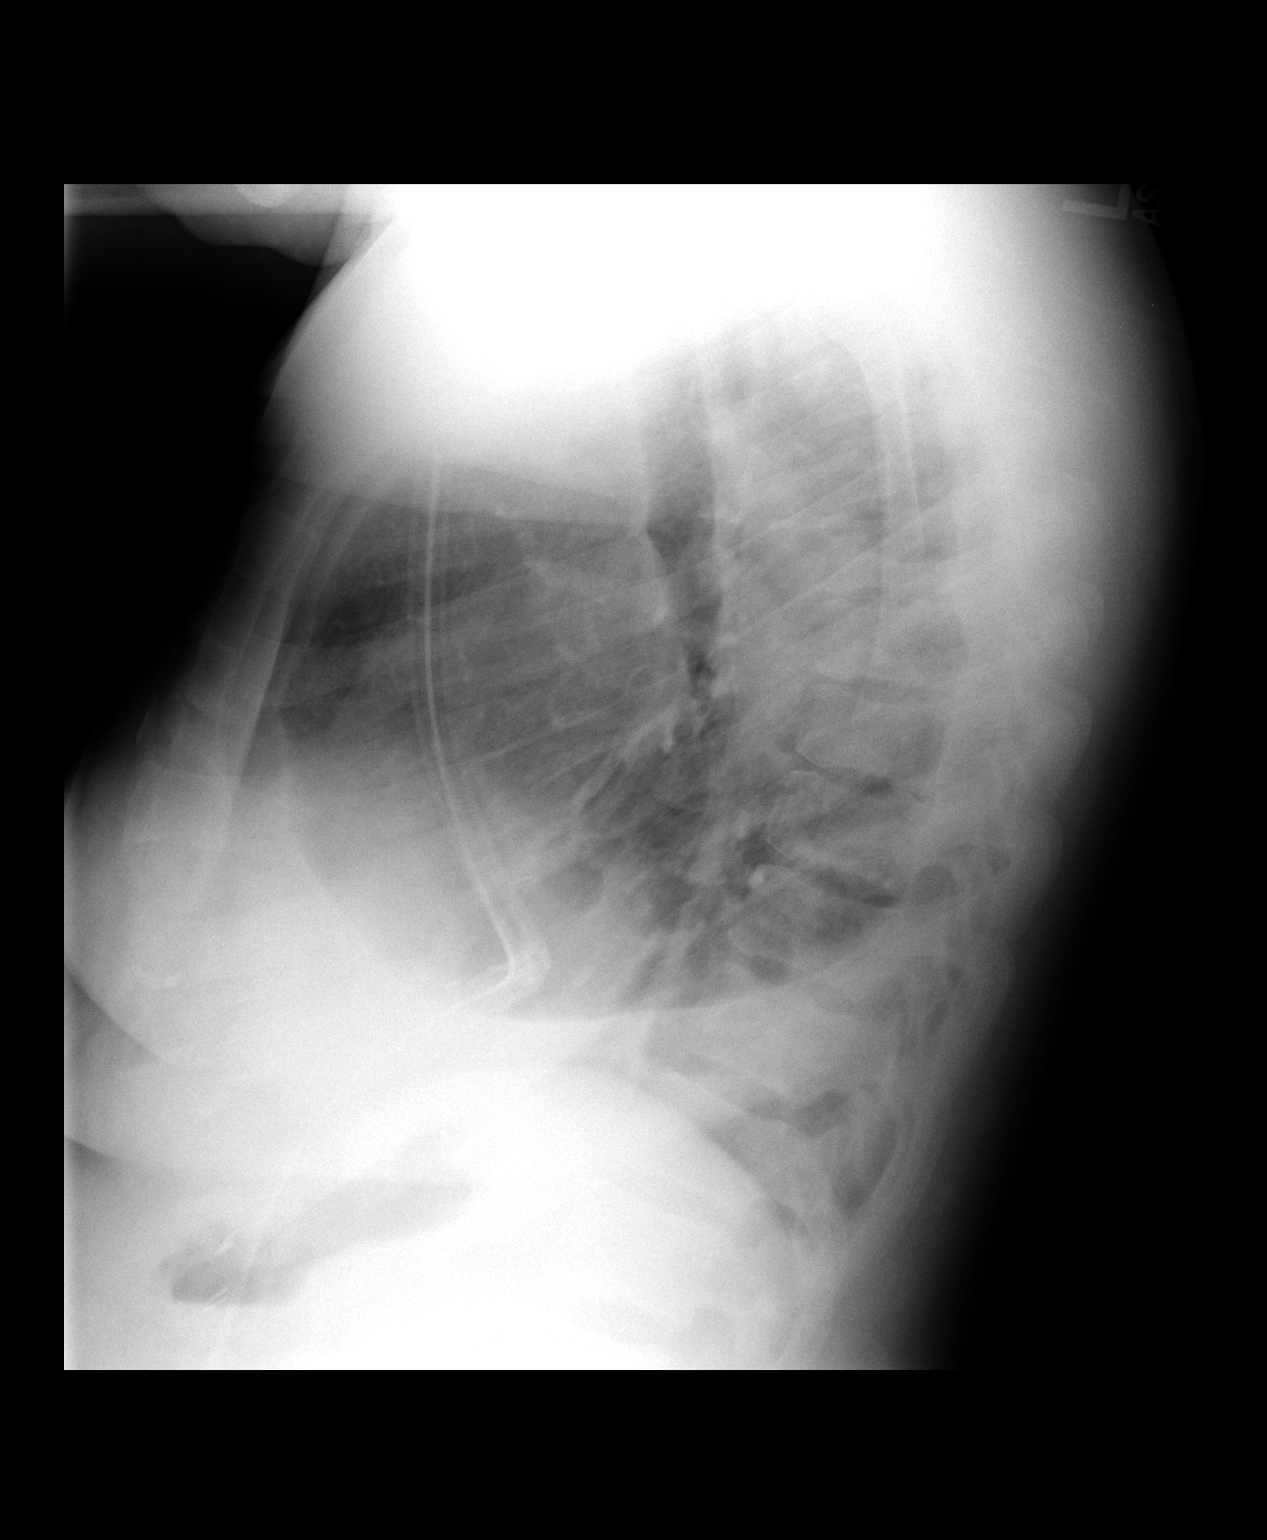

[2 of 2 positions shown; findings below may reference images not displayed]

FINDINGS: Cardiomediastinal silhouette appears normal.  Left lung
is clear.  Right-sided chest tube is unchanged in position.  There
is continued presence of large right sided hydropneumothorax
compared to prior exam which is not significantly changed.  No
mediastinal shift is noted.
IMPRESSION: No significant change seen involving large right hydropneumothorax
and right-sided chest tube compared to prior exam.

## 2014-08-14 IMAGING — CR DG CHEST 2V
2 series · 2 of 2 positions shown · non-contrast
Comparison: [DATE] and 03/10/2013 and chest CT dated 03/06/2013

CLINICAL DATA: Right hydropneumothorax.  Pain and shortness of
breath.

CHEST - 2 VIEW

[w chest pa]
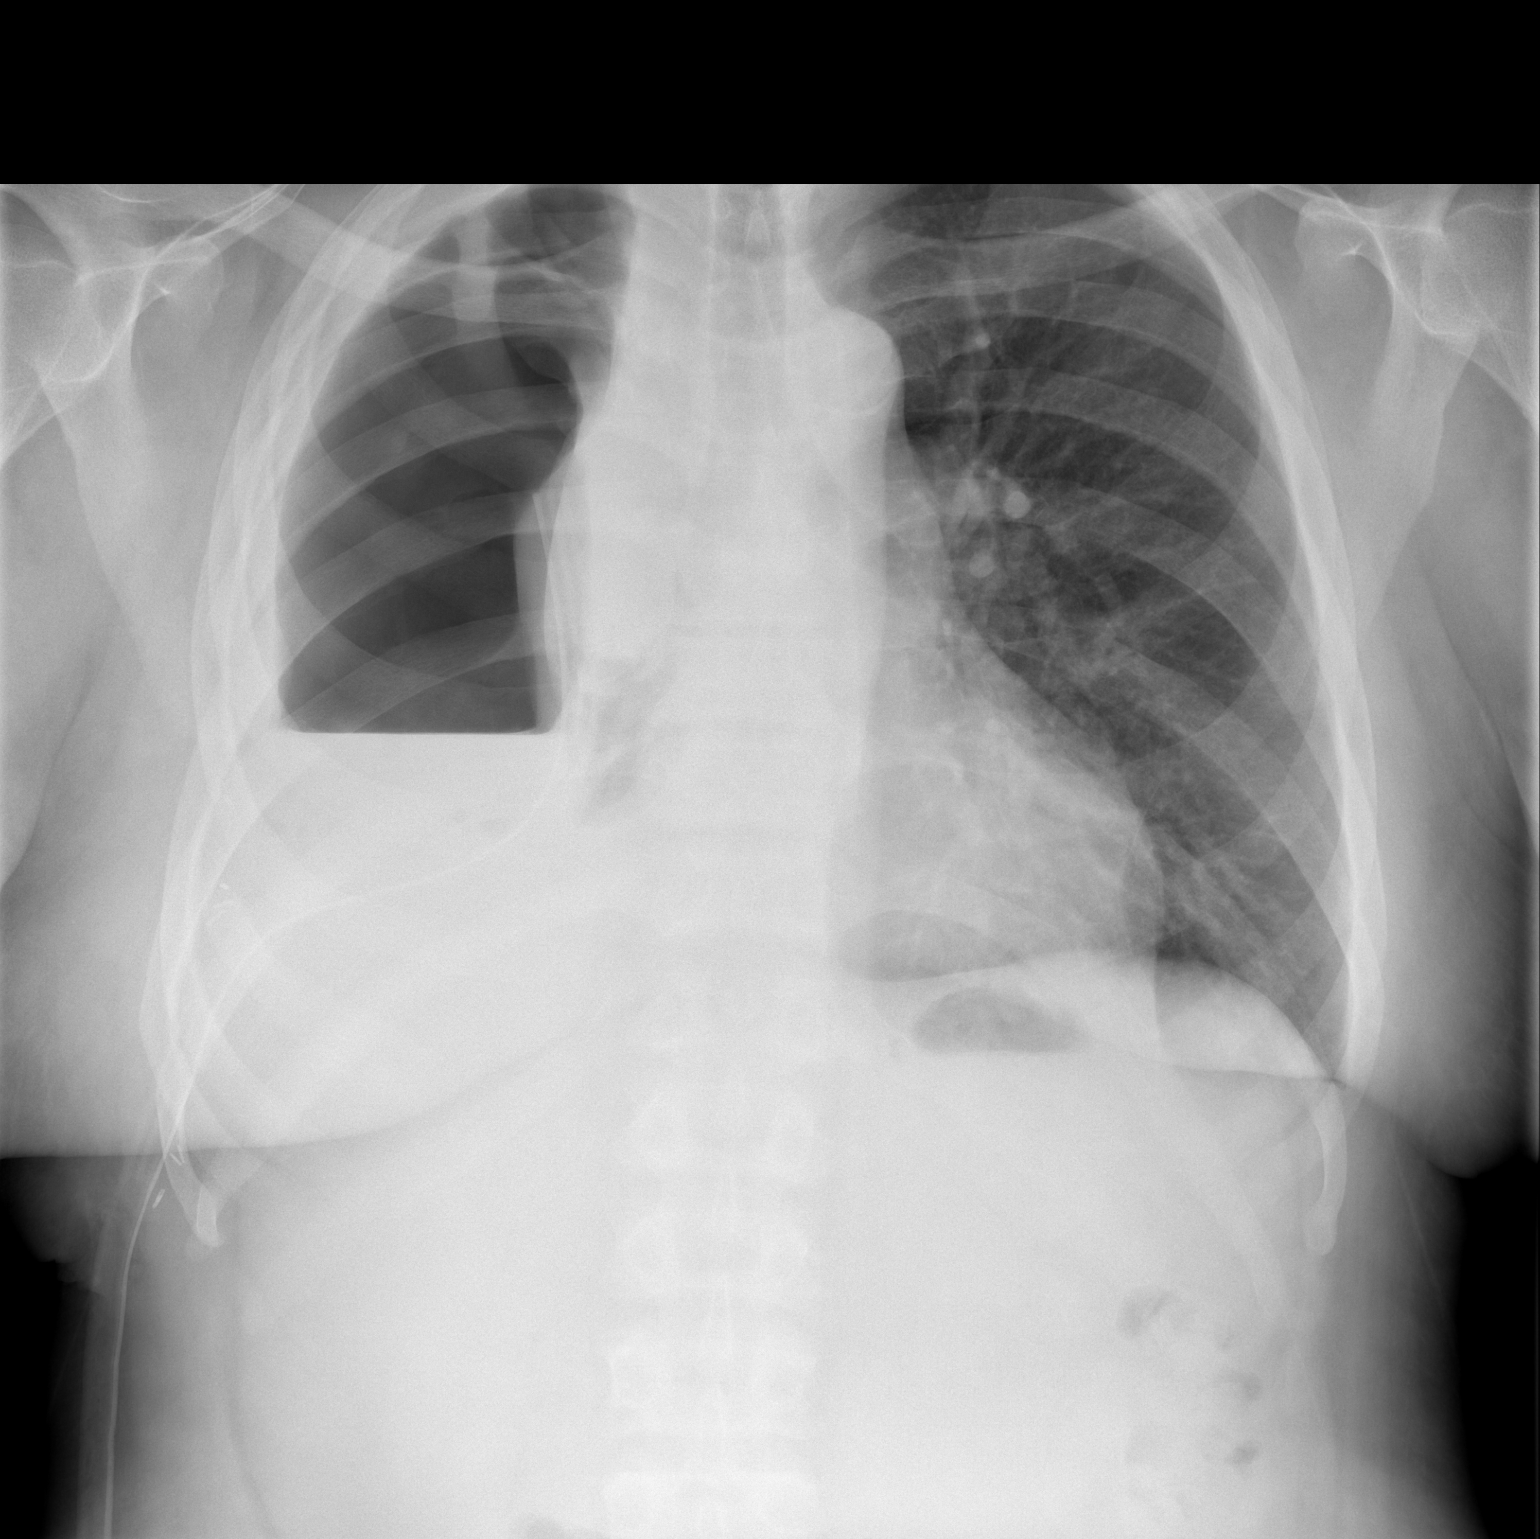

[w chest lat]
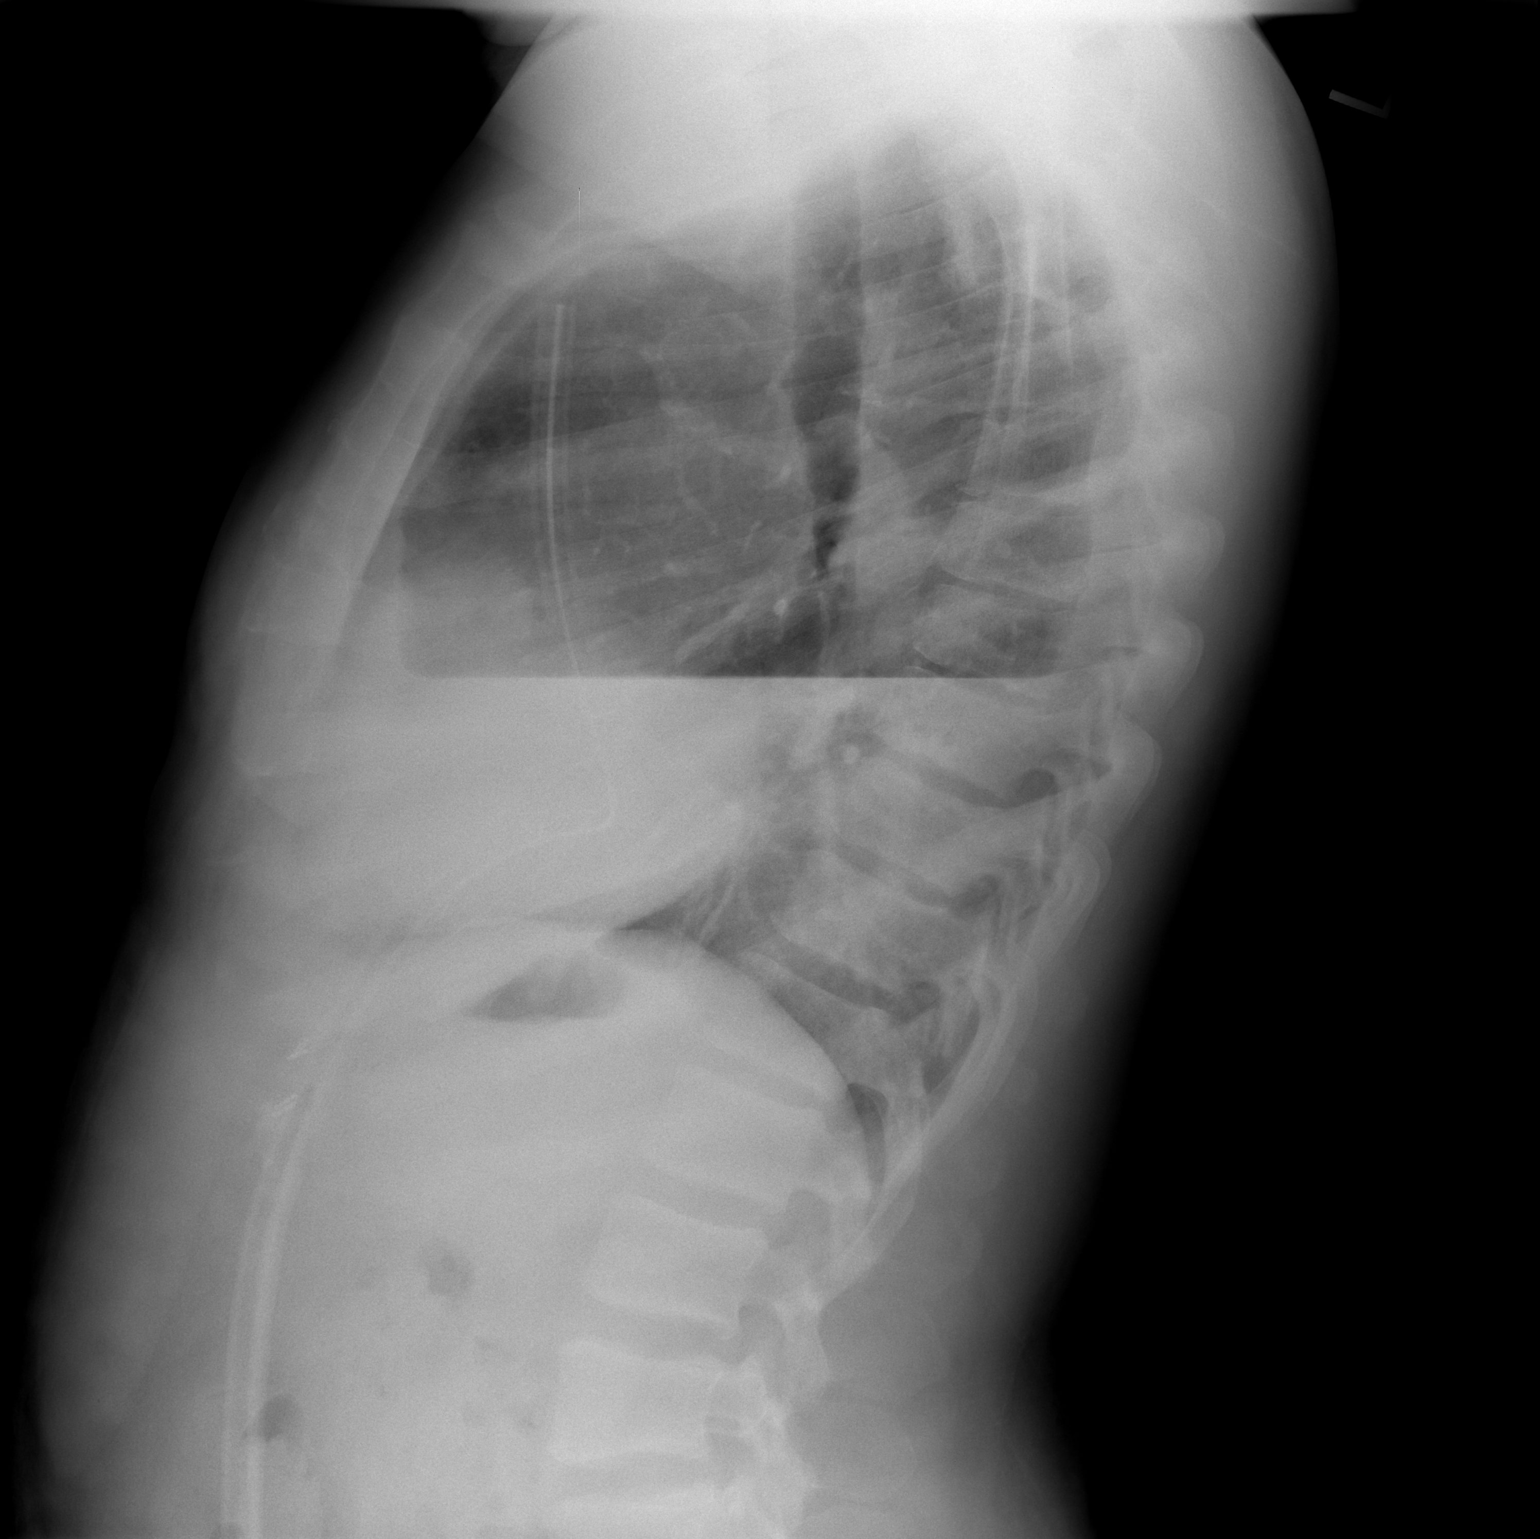

[2 of 2 positions shown; findings below may reference images not displayed]

FINDINGS: Chest tube remains in place.  The effusion has increased
on the right since the prior study.  The right lung remains
atelectatic.

Heart size and vascularity are normal and the left lung is clear.
IMPRESSION: Increasing right pleural effusion.  No re-expansion of the right
lung.

## 2014-08-17 IMAGING — CR DG CHEST 2V
1 series · 2 of 2 positions shown · non-contrast
Comparison: none

REASON FOR EXAM: chest tube removal
COMMENTS:

[Series 1: pa · 0.17mm/px · 2 of 2 slices shown]
[im 1/2]
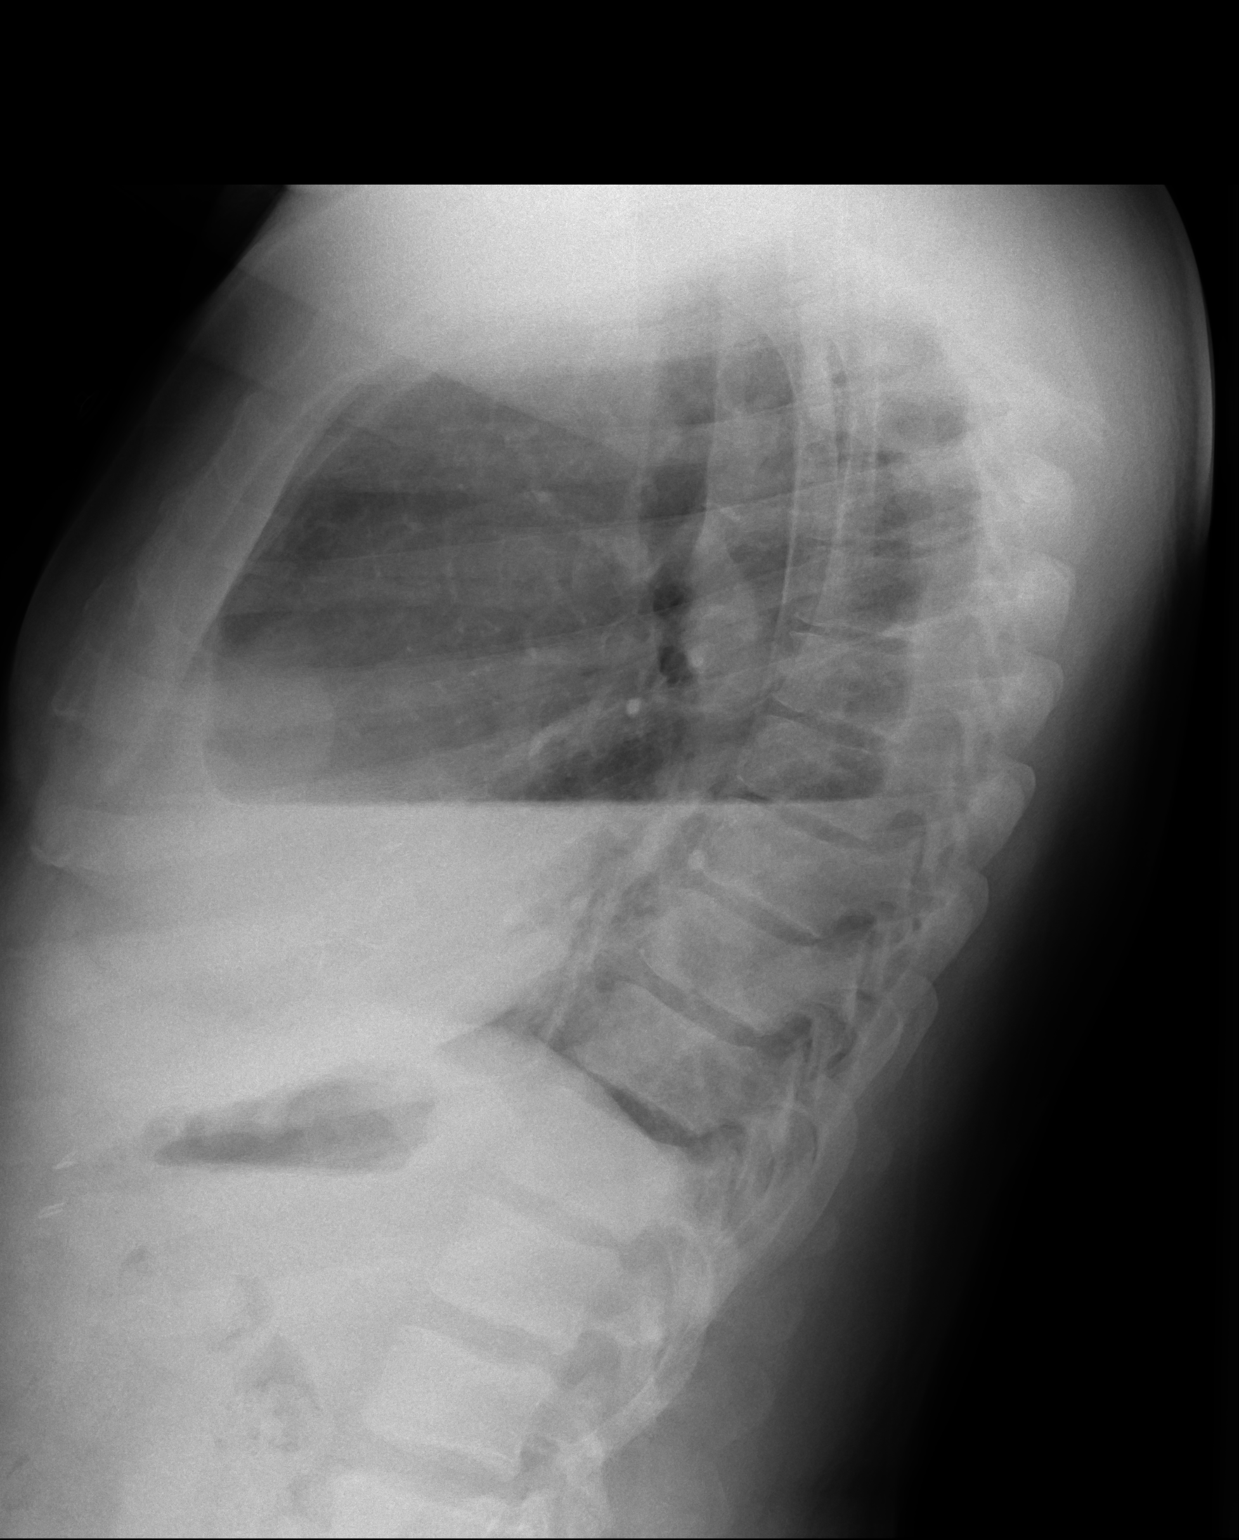
[im 2/2]
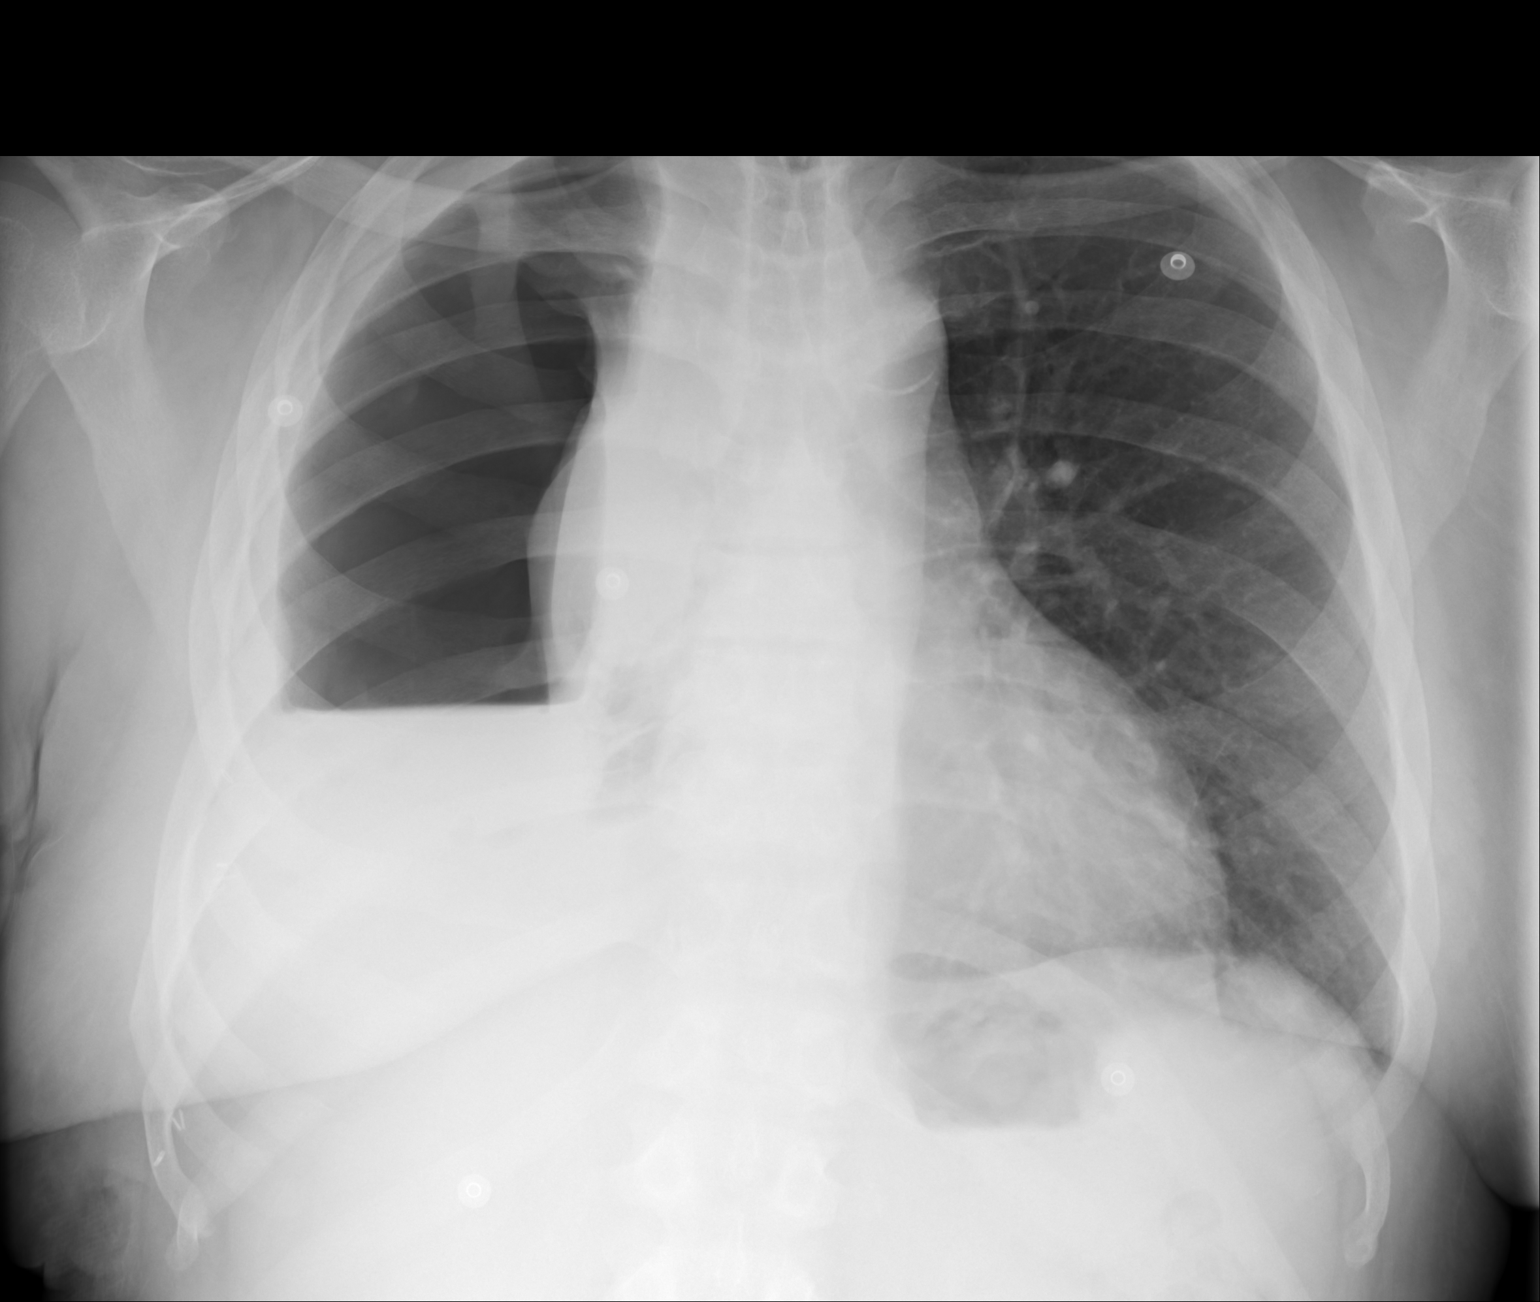

[2 of 2 positions shown; findings below may reference images not displayed]

PROCEDURE:     DXR - DXR CHEST PA (OR AP) AND LATERAL  - April 10, 2013  [DATE]

RESULT:     The patient reports a right chest tube having fallen out today.
There is a total pneumothorax on the right with a large amount of pleural
fluid present. There is no shift of the mediastinum toward the left. The
left lung is well-expanded.
IMPRESSION: There is a large hydropneumothorax on the right. Given the
lack of significant shift toward the left and the appearance of the right
hemithorax on the study 05 January, 2013, there is likely a rigid fibrotic
component of the pleura now.

Findings are known to Dr. Jack.

[REDACTED]

## 2014-10-29 IMAGING — CR DG CHEST 1V PORT
1 series · 1 of 1 positions shown · non-contrast
Comparison: none

REASON FOR EXAM: sob
COMMENTS:

PROCEDURE:     DXR - DXR PORTABLE CHEST SINGLE VIEW  - June 22, 2013 [DATE]
RESULT:     Comparison: 04/10/2013

[ap]
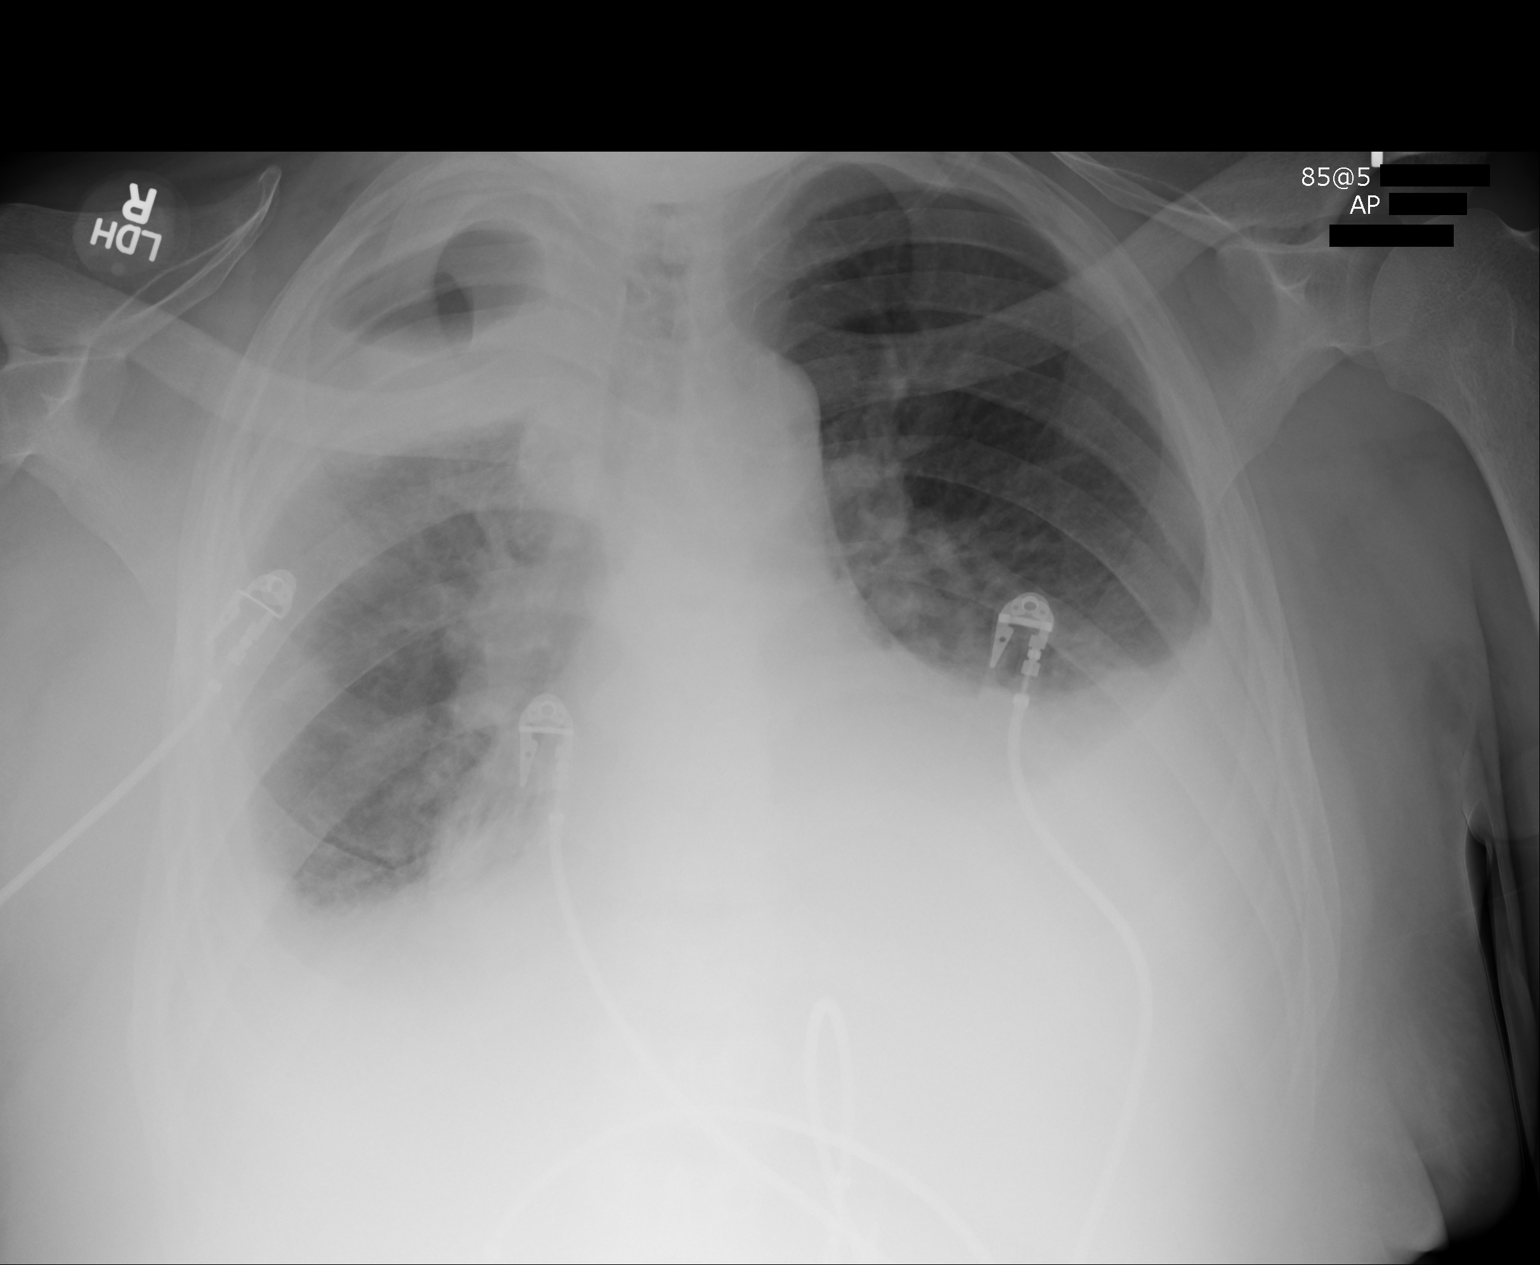

[1 of 1 positions shown; findings below may reference images not displayed]

FINDINGS: The heart and mediastinum appears similar to prior given overlapping
obscuring opacities. The right hydropneumothorax is decreased from prior,
with persistent density lucency at the right lung apex. There is some
persistent pleural fluid along the right base. There is a moderate-sized
left pleural effusion, new from prior. There diffuse interstitial opacities,
right greater than left.
IMPRESSION: 1. Right hydropneumothorax is decreased from prior. However, given the
persistent hydropneumothorax, etiology such a bronchopleural fistula cannot
be excluded. Continued followup is suggested.
2. New moderate-sized left pleural effusion.
3. Diffuse heterogeneous opacities, right greater than left, may represent
pulmonary edema. Other etiologies such as infection are not excluded.

[REDACTED]

## 2014-11-09 IMAGING — CR DG CHEST 2V
1 series · 3 of 3 positions shown · non-contrast
Comparison: none

REASON FOR EXAM: pleural effusion
COMMENTS:

[Series 1: pa · 0.17mm/px · 3 of 3 slices shown]
[im 1/3]
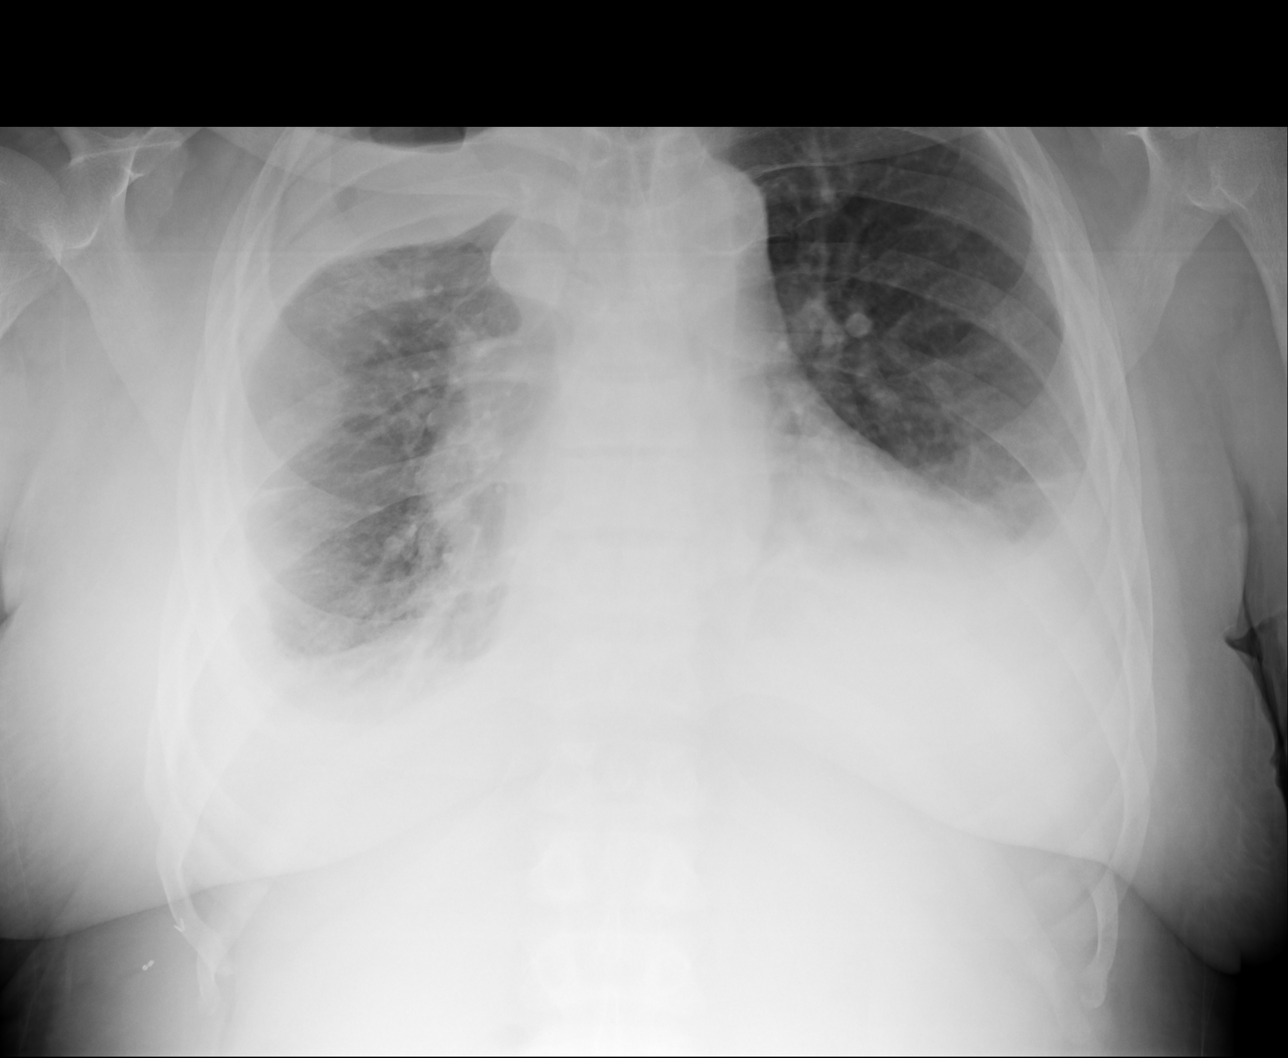
[im 2/3]
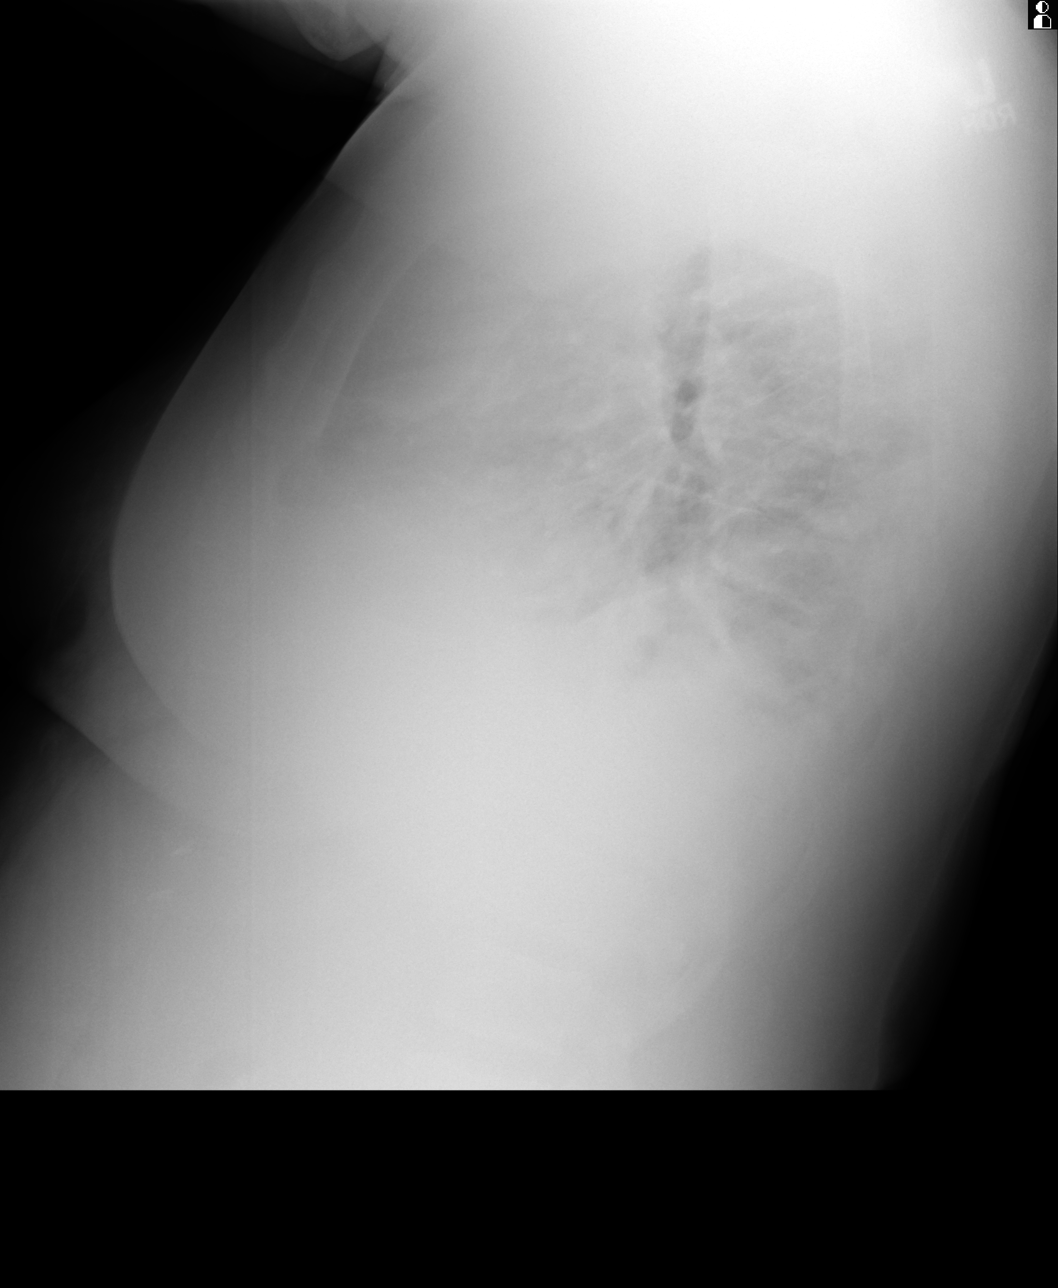
[im 3/3]
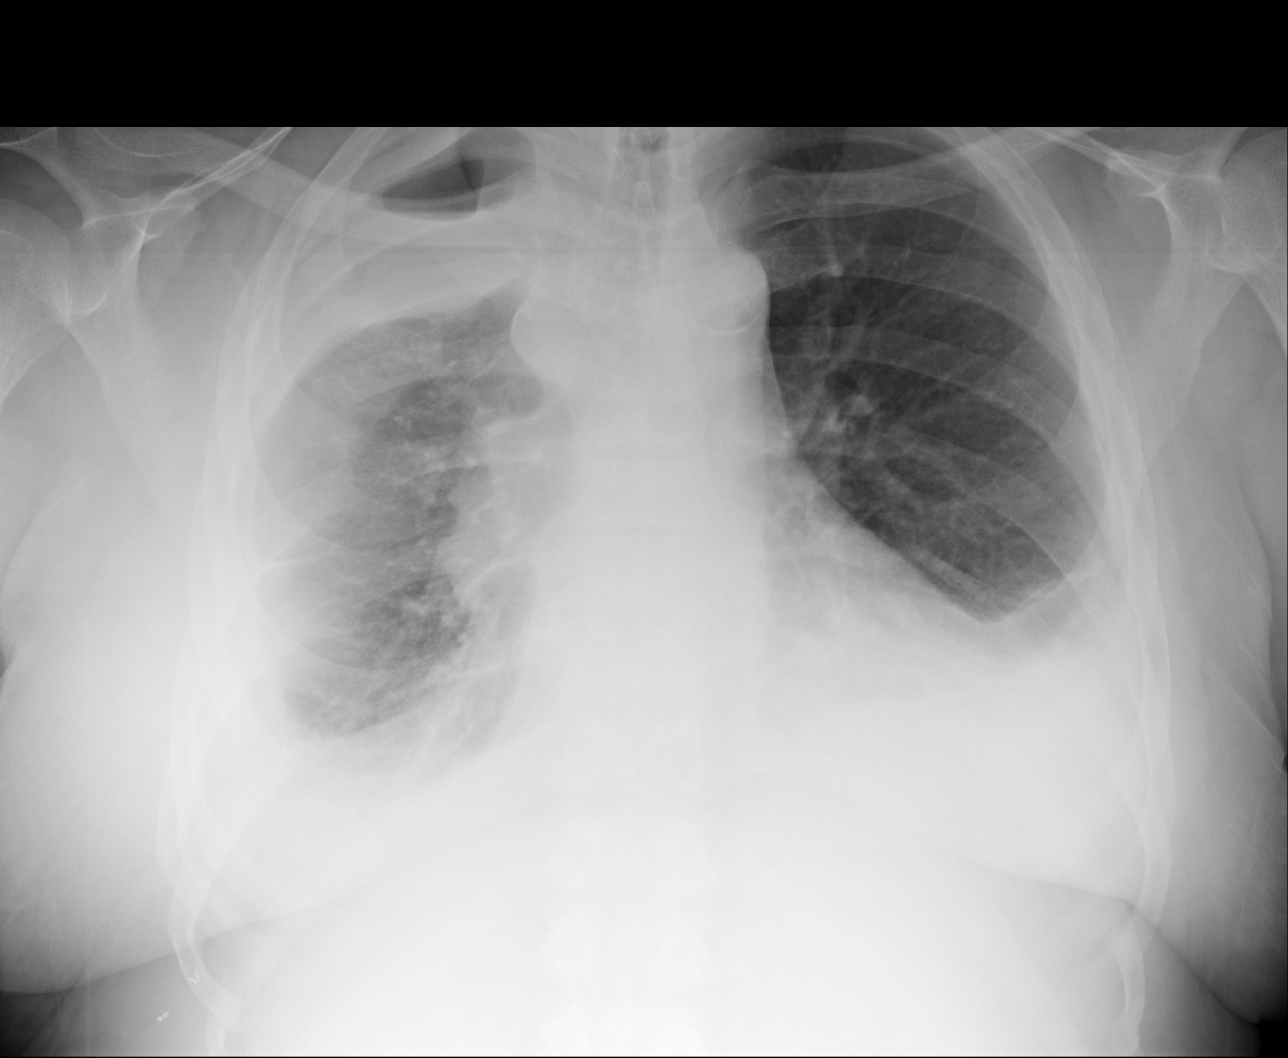

[3 of 3 positions shown; findings below may reference images not displayed]

PROCEDURE:     KDR - KDXR CHEST PA (OR AP) AND LAT  - July 03, 2013 [DATE]

RESULT:     Comparison is made to the study June 22, 2013.

There remain abnormalities in both hemithoraces. There is a moderate to
large pleural effusion on the left which may have slightly decreased in
size. On the right pleural fluid or thickening inferiorly along the lateral
thoracic wall and in the upper hemithorax is present. The well marginated
gas collection in the pulmonary apex is unchanged and consistent with a
small pneumothorax. The cardiac silhouette is enlarged. The pulmonary
vascularity is indistinct.
IMPRESSION: 1. There is a stable appearance of the right lung where a hydropneumothorax
has been previously described. There is evidence of pleural fluid on the
left which may be slightly decreased since the previous study.
2. There are findings which likely reflect underlying low-grade CHF.

[REDACTED]

## 2014-11-27 IMAGING — CR DG CHEST 2V
2 series · 2 of 2 positions shown · non-contrast
Comparison: 04/07/2013

CLINICAL DATA: Pleural effusion, shortness of breath, chest pain

CHEST - 2 VIEW

[w chest pa]
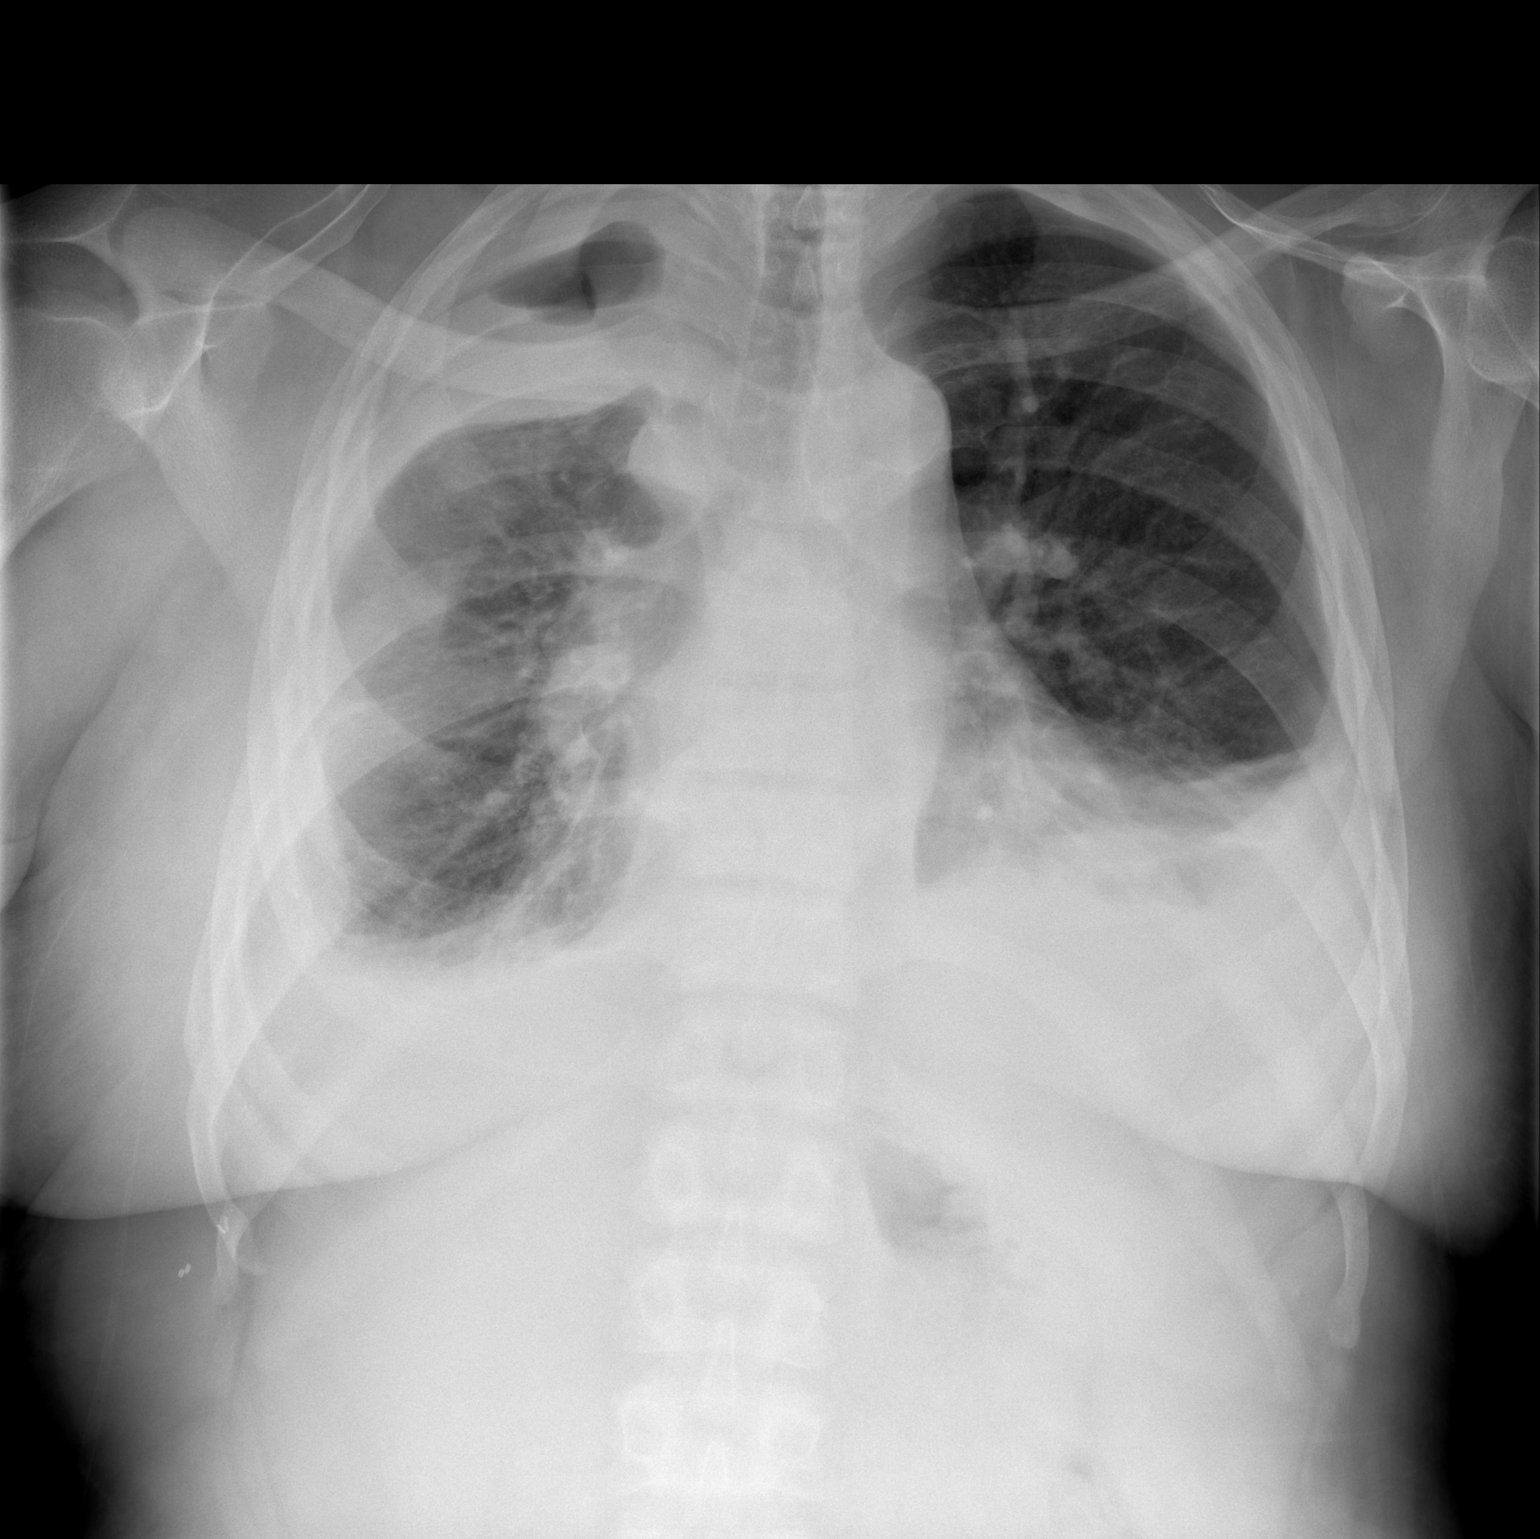

[w chest lat]
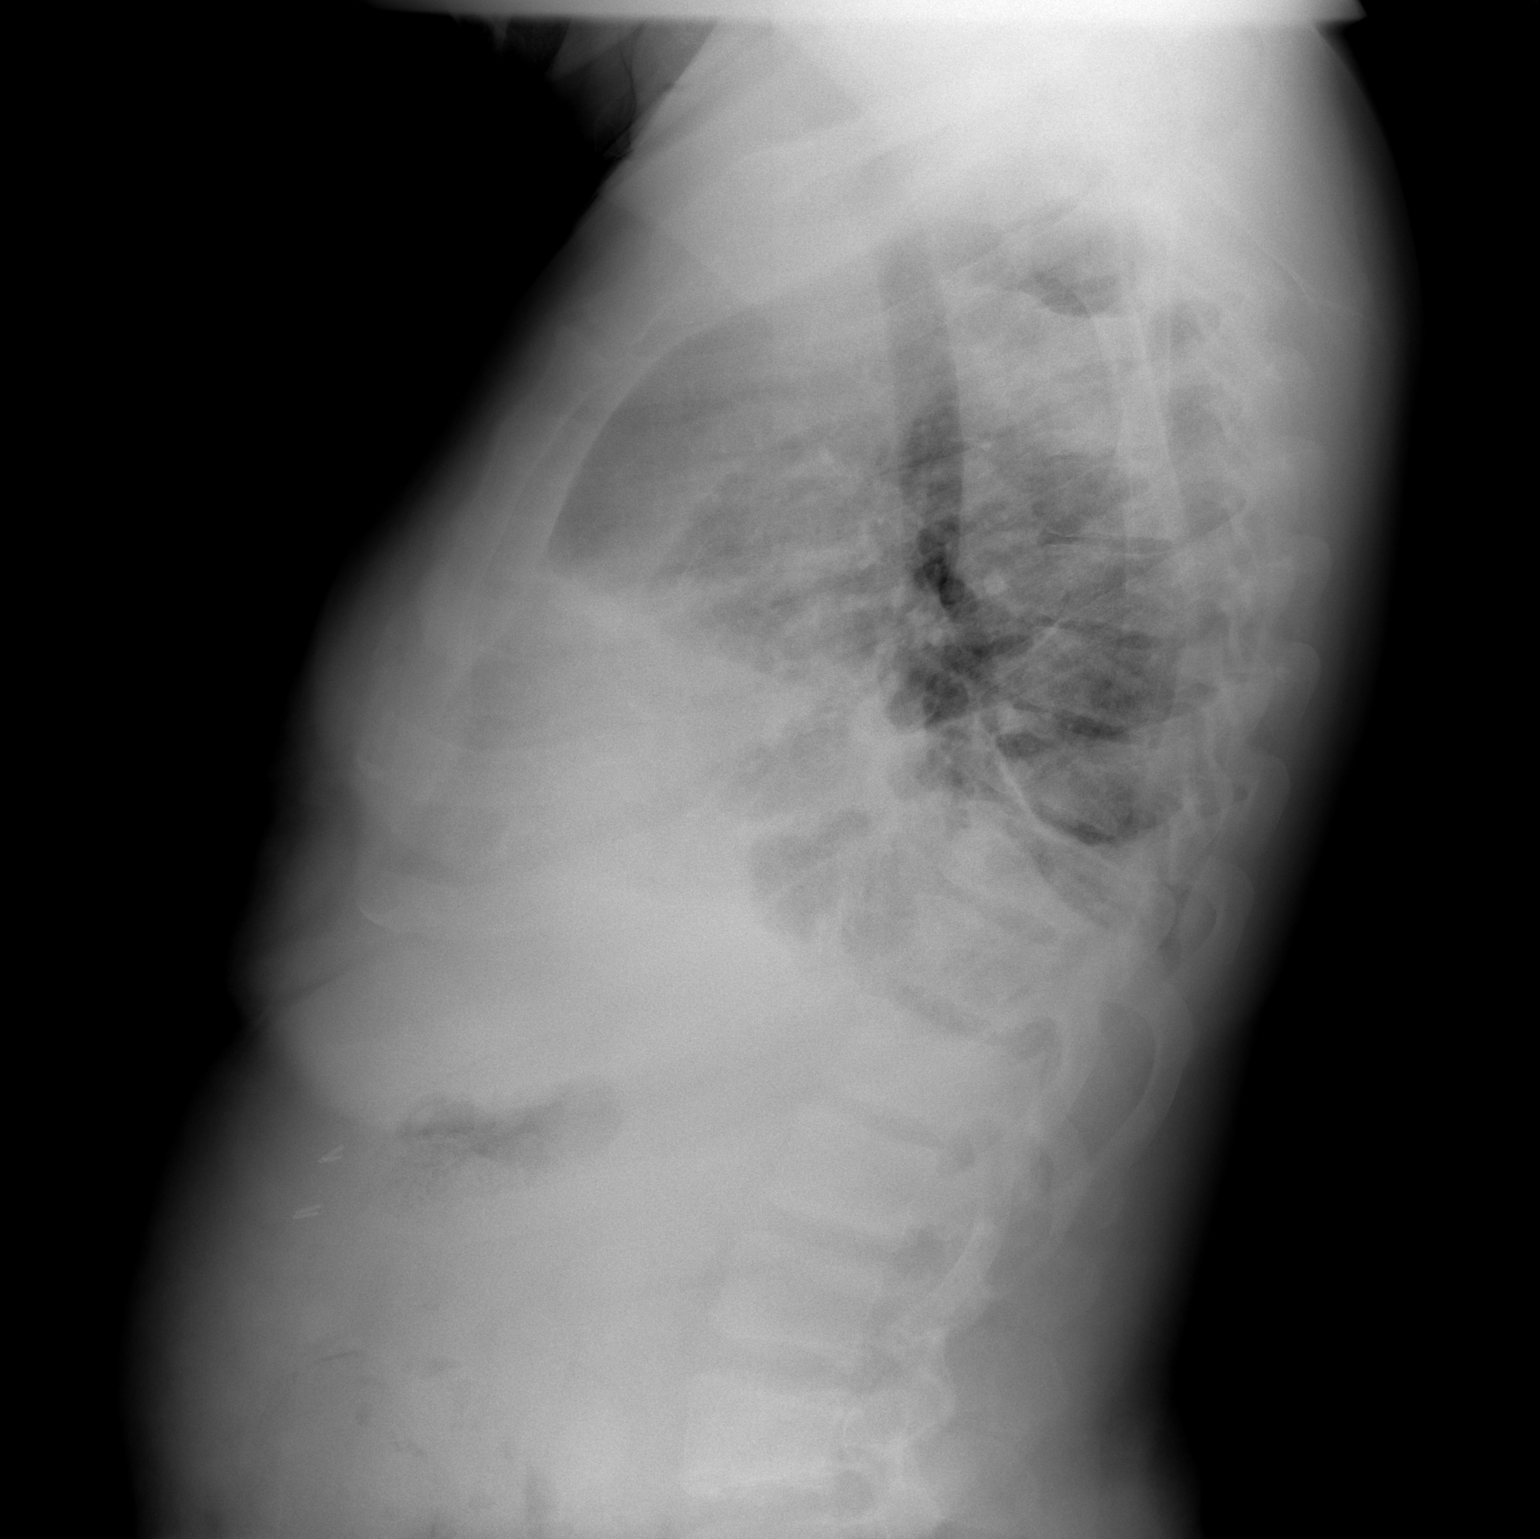

[2 of 2 positions shown; findings below may reference images not displayed]

FINDINGS: There is interval improvement in right lung aeration.
There is a residual right hydropneumothorax with a pleural air
fluid level in the right apical region.  Right chest tube no longer
present.  Trachea is midline.  Interval development of a left
pleural effusion with lingula and left base compressive
atelectasis.  Left upper lobe clear.  Stable heart size and
vascularity.
IMPRESSION: Interval improvement in right lung aeration with a residual right
hydropneumothorax.

Right chest tube removed

Developing small left effusion with lingula and left lower lobe
compressive atelectasis

## 2014-12-06 IMAGING — CR DG CHEST 1V
1 series · 1 of 1 positions shown · non-contrast
Comparison: 07/21/2013.

CLINICAL DATA: Post left-sided thoracentesis.

CHEST - 1 VIEW

[w chest pa]
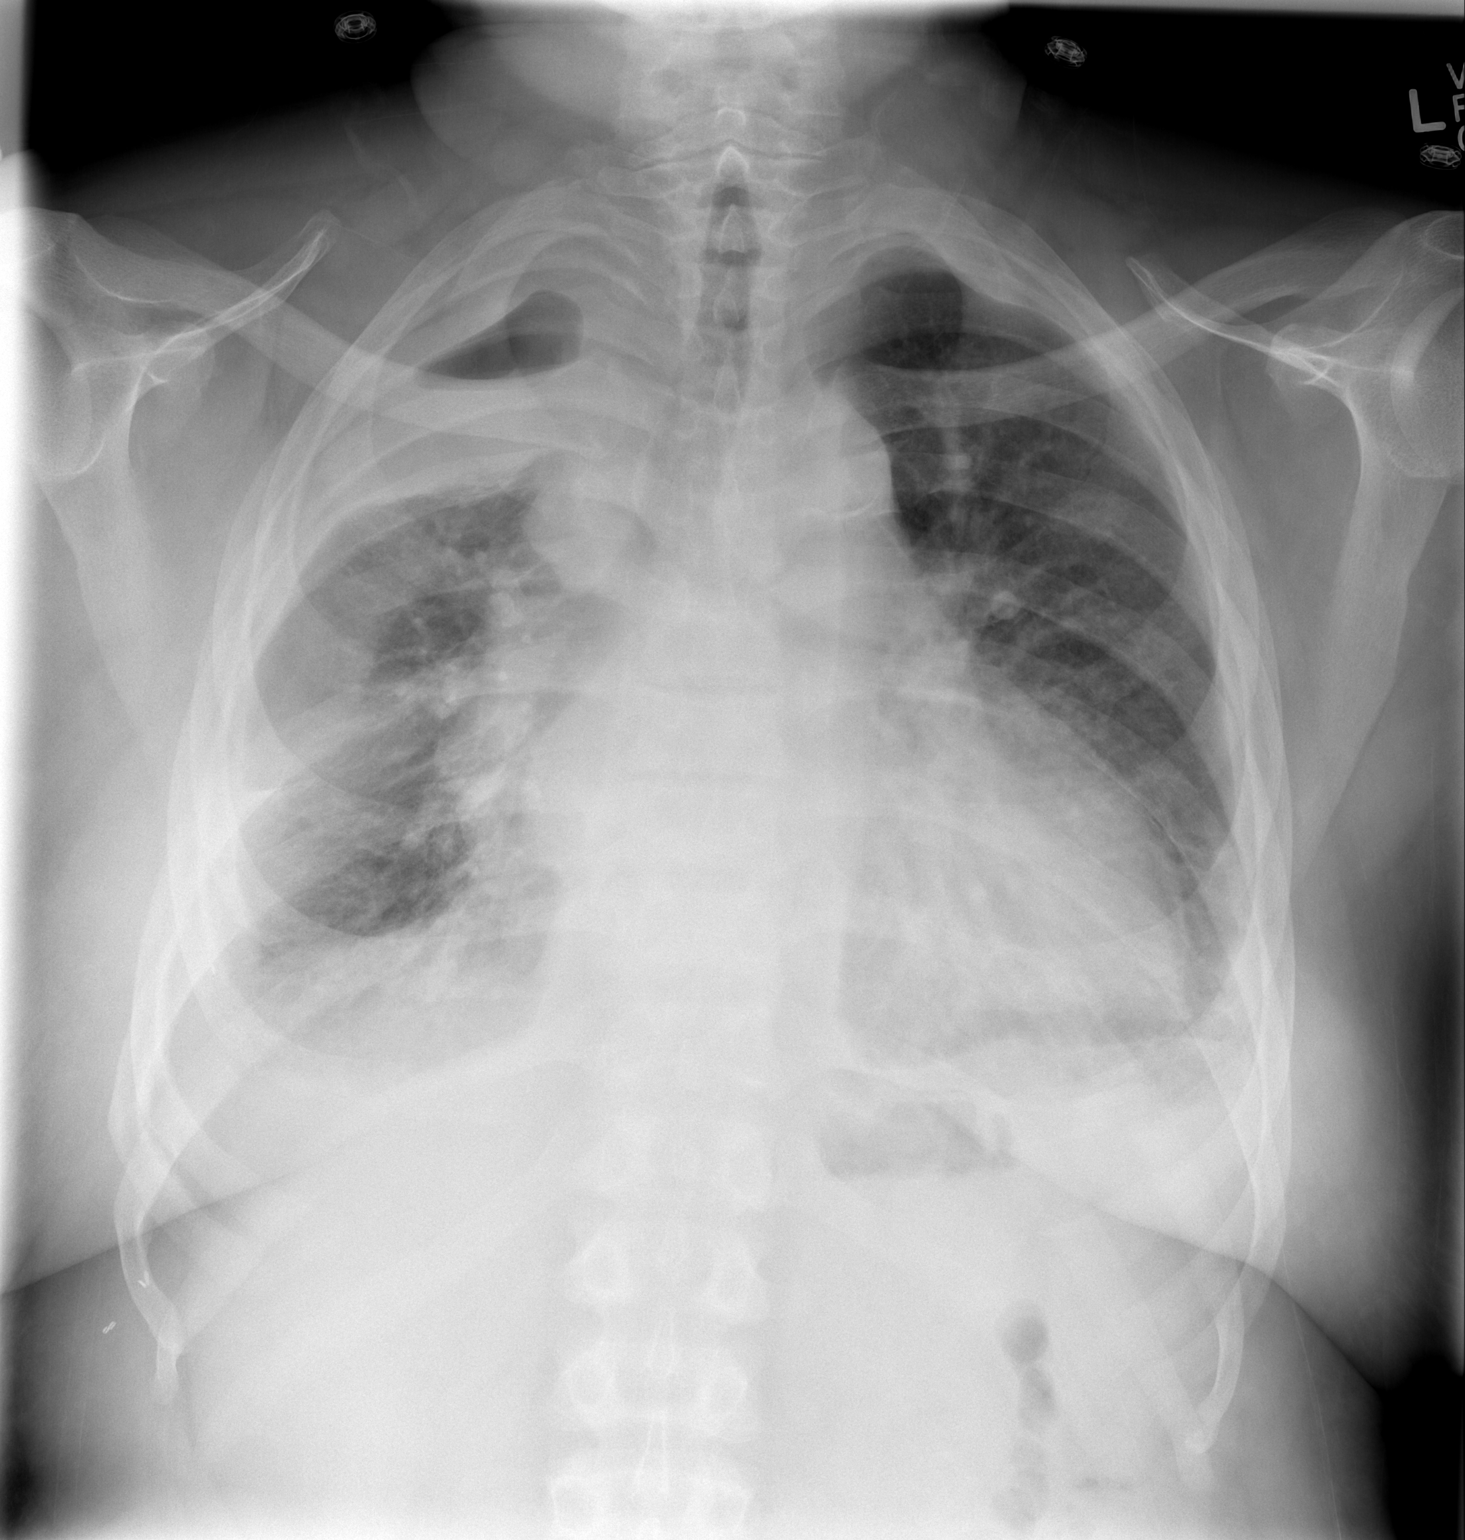

[1 of 1 positions shown; findings below may reference images not displayed]

FINDINGS: Post left-sided thoracentesis with decrease in size of
left-sided pleural effusion without left-sided pneumothorax noted.
Small amount of left pleural effusion remains.

Right-sided pleural thickening/pleural effusion with right apical
hydropneumothorax similar to the prior exam.

Cardiomegaly.

Pulmonary vascular congestion.
IMPRESSION: Post left-sided thoracentesis with decrease in size of left-sided
pleural effusion without left-sided pneumothorax noted.  Small
amount of left pleural effusion remains.

Right-sided pleural thickening/pleural effusion with right apical
hydropneumothorax similar to the prior exam.

Cardiomegaly.

Pulmonary vascular congestion.

This is a call report.

## 2014-12-11 IMAGING — CR DG CHEST 2V
2 series · 2 of 2 positions shown · non-contrast
Comparison: 07/30/2013.

CLINICAL DATA: Post thoracentesis from 07/30/2013.

EXAM:
CHEST  2 VIEW

[view not recorded (1 of 2)]
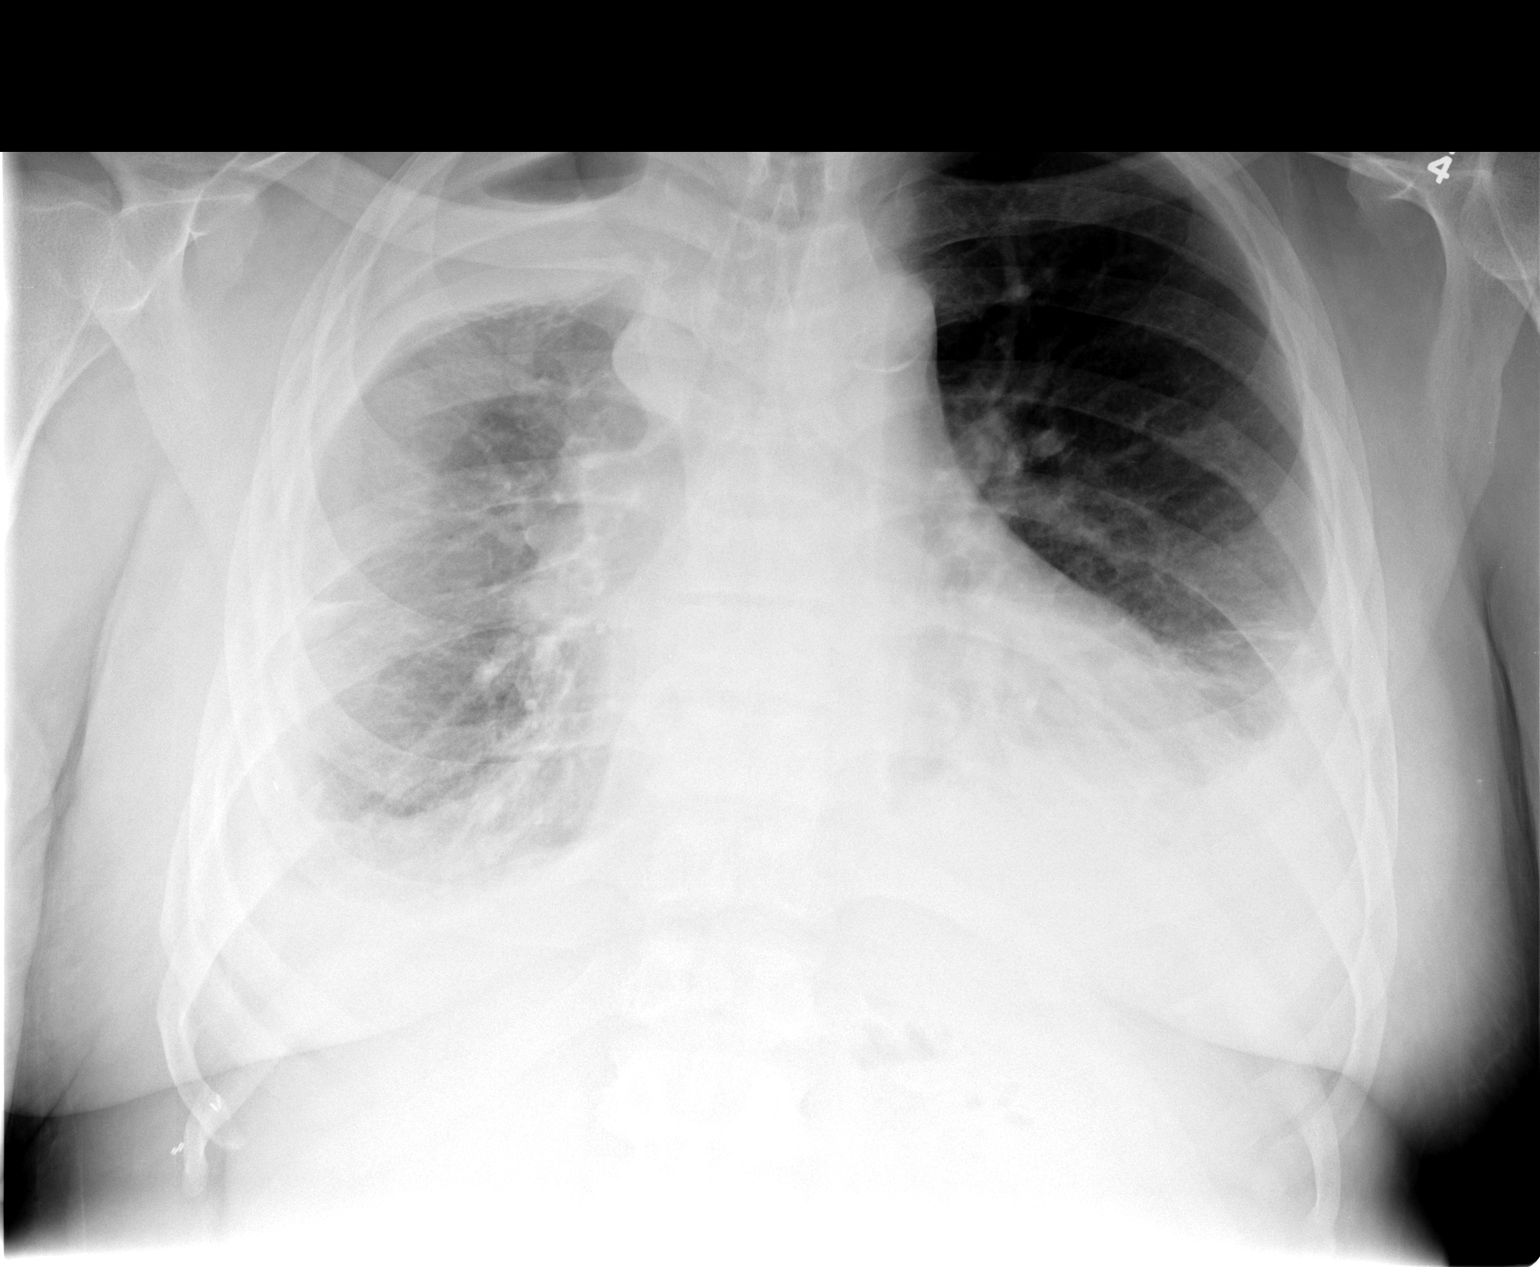

[view not recorded (2 of 2)]
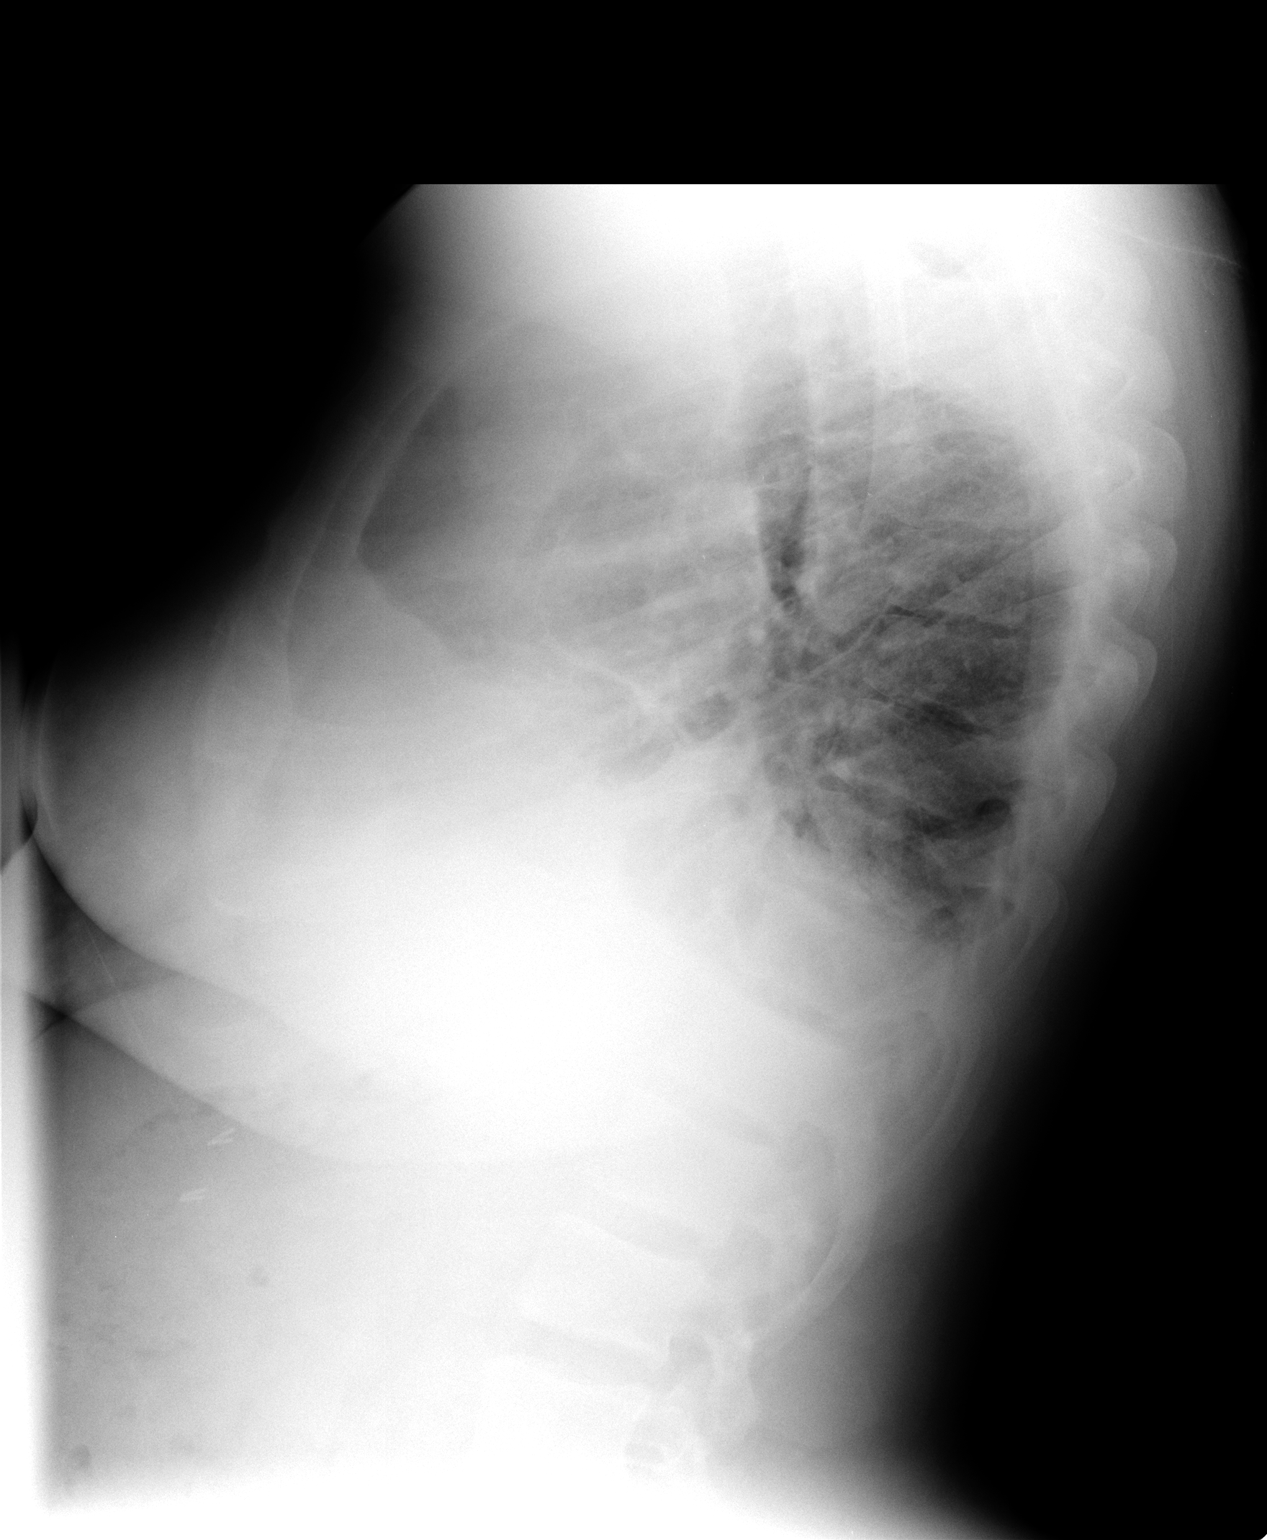

[2 of 2 positions shown; findings below may reference images not displayed]

FINDINGS: There are moderate bilateral pleural effusions. Right pleural
effusion or pleural thickening is similar to the post thoracentesis
chest x-ray. Enlarging left pleural effusion. No pneumothorax.
Bibasilar airspace opacities. Right apical density with
hydropneumothorax again noted, unchanged. Cardiomegaly. No acute
bony abnormality.
IMPRESSION: Reaccumulation of the left pleural effusion, now moderate. Stable
findings on the right.

Cardiomegaly.

## 2014-12-25 IMAGING — CR DG CHEST 2V
2 series · 2 of 2 positions shown · non-contrast
Comparison: 08/04/2013

CLINICAL DATA: Right pleural effusion, smoker

CHEST - 2 VIEW

[w chest pa]
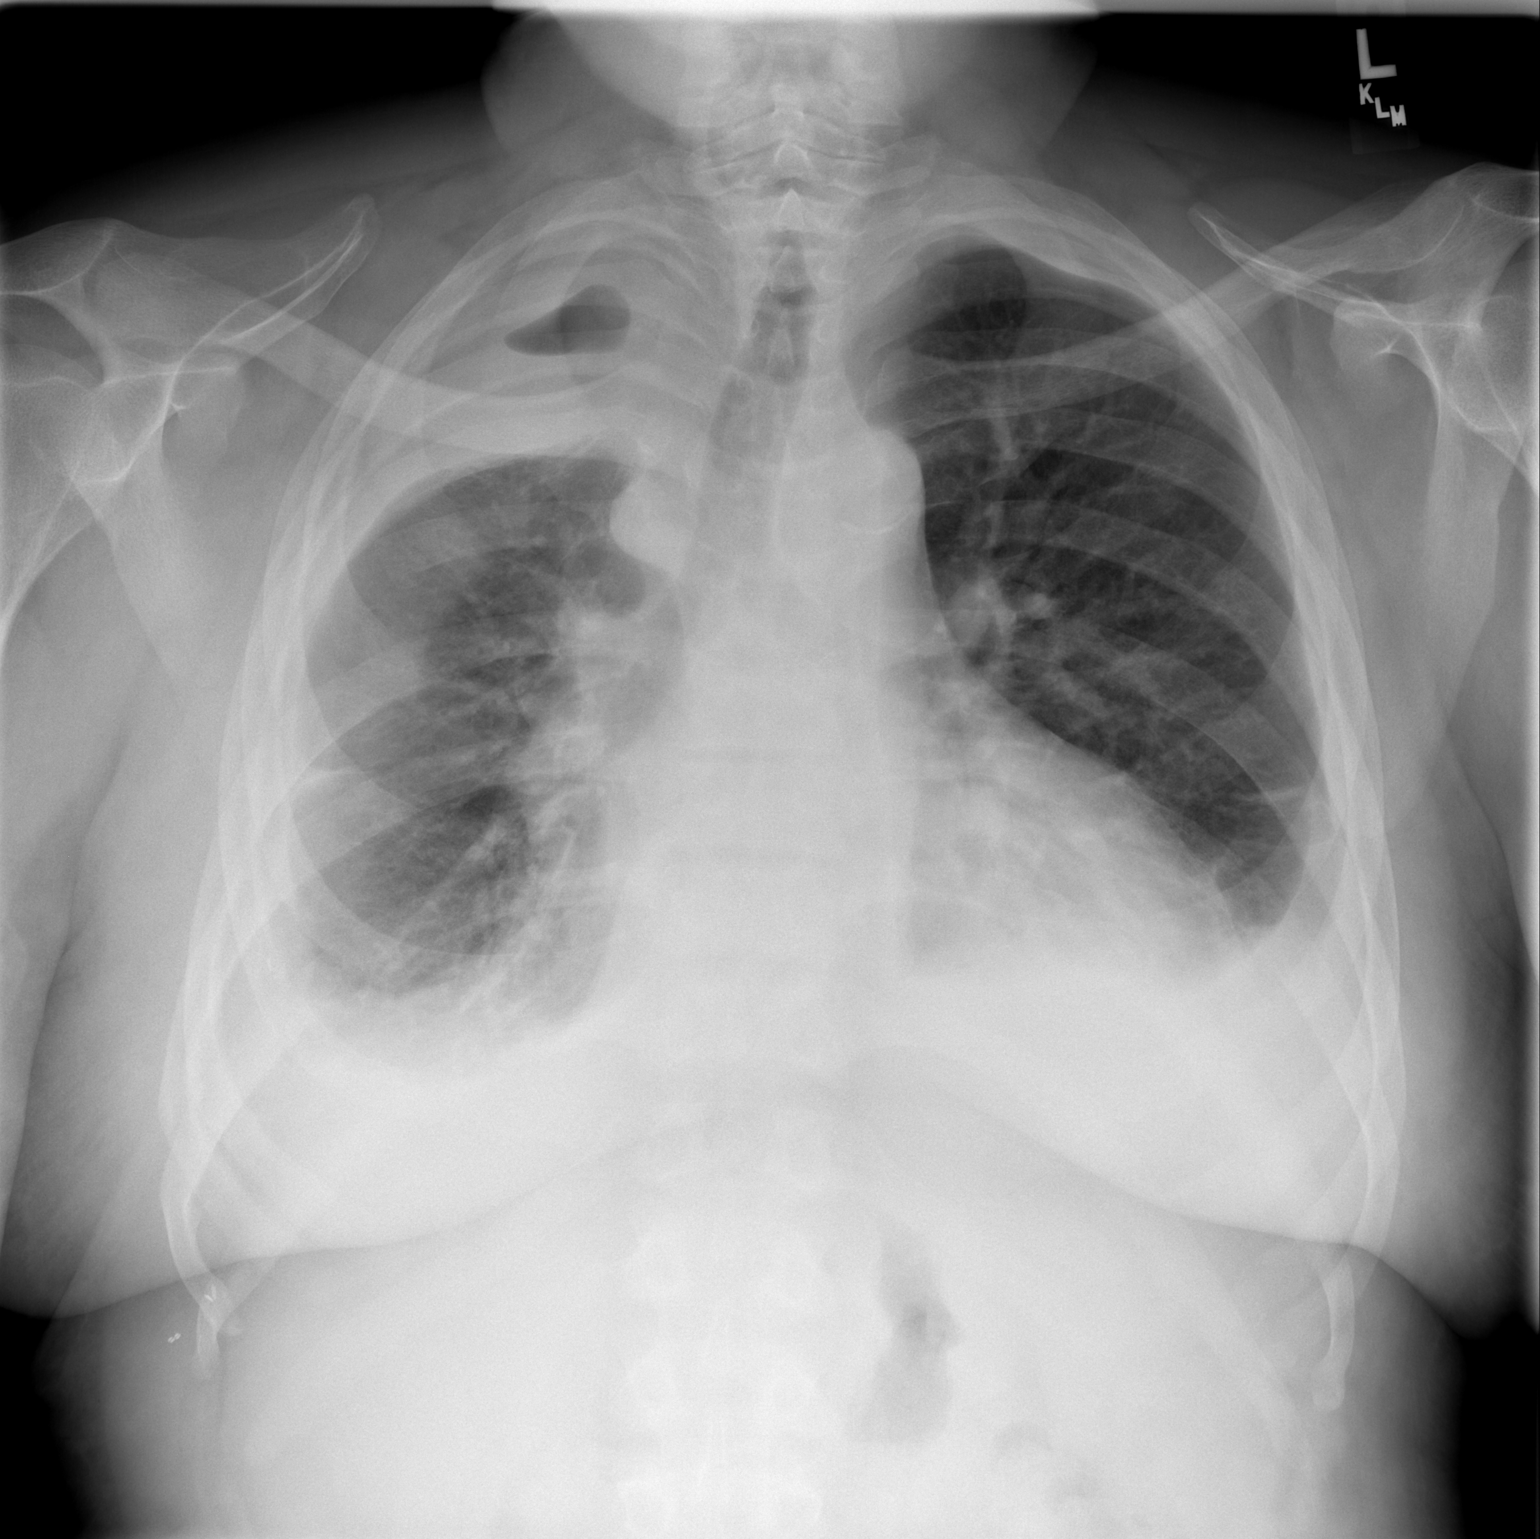

[w chest lat]
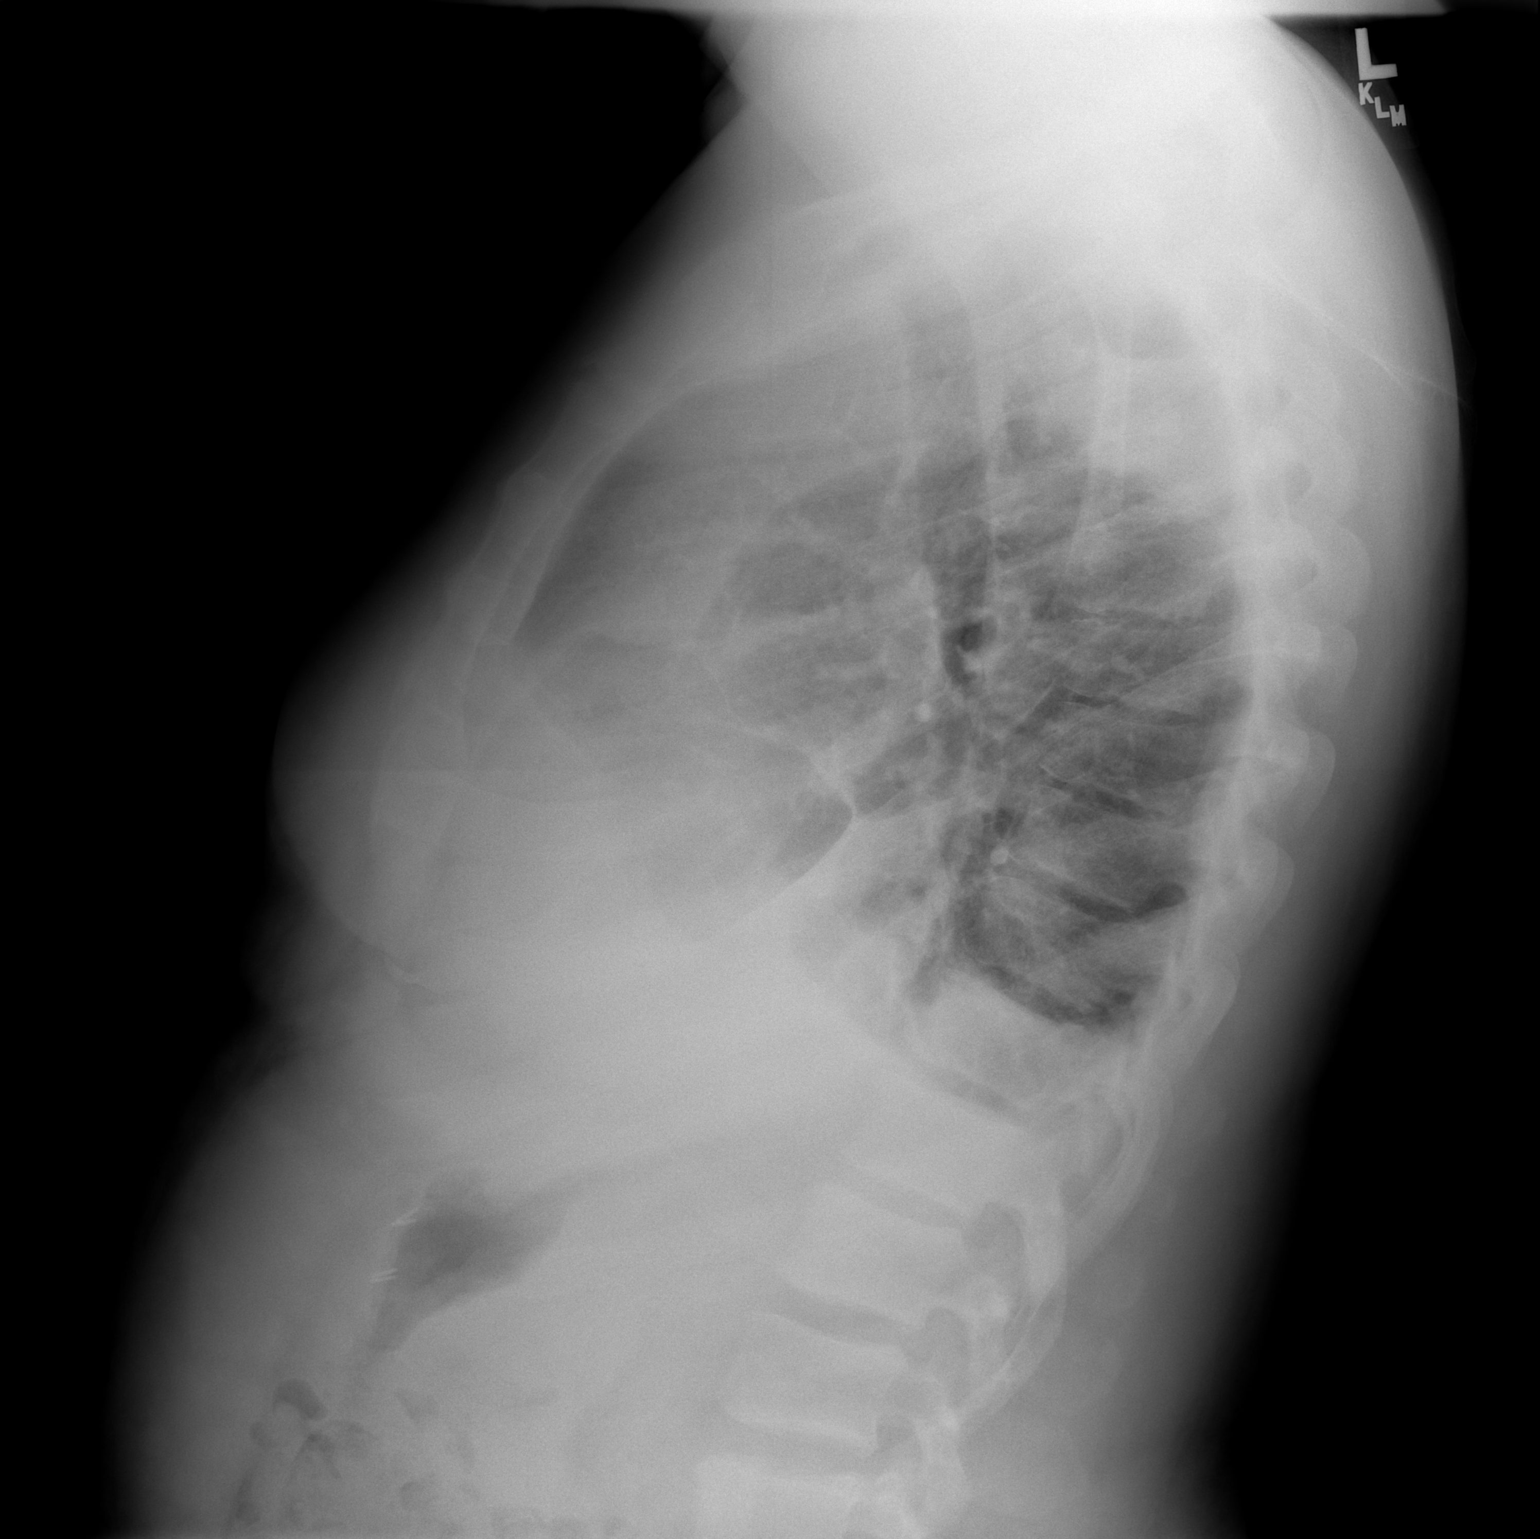

[2 of 2 positions shown; findings below may reference images not displayed]

FINDINGS: Chronic right hydropneumothorax.  Similar appearance
compared to 08/04/2013.  Stable small left effusion as well.
Basilar densities noted compatible with atelectasis versus
scarring.  Heart is enlarged.  Central vascular congestion evident.
No interval change.
IMPRESSION: Stable right hydropneumothorax.  Stable small left effusion.

## 2015-04-01 NOTE — Consult Note (Signed)
Pt with multiple passages of blood today, last one BRB  with small volume with clots, Pt sitting up in bed without dizzyness or light headed ness. He is to be transferred to [facility] at his request as soon as the unit of blood he is getting has run in.  Systolic BP [id number], chest clear, awake and answers questions appropriately.  I briefly explained what diverticulosis is and why he is bleeding.  Electronic Signatures: Manya Silvas (MD)  (Signed on 02-Jan-14 17:52)  Authored  Last Updated: 02-Jan-14 17:52 by Manya Silvas (MD)

## 2015-04-01 NOTE — Consult Note (Signed)
Chief Complaint:   Subjective/Chief Complaint Patient had several episodes of bleedig this morning and became tachycardic. Hemoglobin lower at 6.5. Bleeding scan showd active bleeding in left colon. Case was discussed with Dr. Clayton Bibles and patient transferred to CCU. Stat vascular surgery consult was obtained and patient has ben seen by Dr. Lucky Cowboy. An angiogram with emolization was recommended by Dr. Lucky Cowboy with which I agree but the patient does not want to have theprocedure here and has requested transfer to another facility. He fully undestands the risks involved with transfer and delaying the treatment but would not consent for the procedure here. Family member was unable to change his mind either.  Transfuse 2 units PRBC. Follow H and H. Dr. Vira Agar is covering me from now through the weekend. Please call him if any concerns.   Electronic Signatures: Jill Side (MD)  (Signed 02-Jan-14 15:41)  Authored: Chief Complaint   Last Updated: 02-Jan-14 15:41 by Jill Side (MD)

## 2015-04-01 NOTE — Consult Note (Signed)
Brief Consult Note: Diagnosis: Lower GI bleed.   Patient was seen by consultant.   Consult note dictated.   Comments: Lower GI bleed most likely diverticular, resolved. Chronic anemia with further drop due to recent bleeding. Renal failure on dialysis.  Recommendations: Full liquid diet. Follow H and H and transfuse if hemoglobin drops to 7 or below. Attempts were made to prep him for colonoscopy but he drank only 1/3 of golytly. Colonoscopy was planned for tomorrow after dialysis today but his dialysis have been cancelled for today and he will be dialyzed tomorrow. Will hold off on coonoscopy at this time. Obtain bleeding scan if signs of active bleeding. Will follow.  Electronic Signatures: Jill Side (MD)  (Signed 01-Jan-14 15:24)  Authored: Brief Consult Note   Last Updated: 01-Jan-14 15:24 by Jill Side (MD)

## 2015-04-01 NOTE — Discharge Summary (Signed)
PATIENT NAME:  [name] [name], [name] [name] MR#:  [id number] DATE OF BIRTH:  Oct 25, 1961  DATE OF ADMISSION:  12/10/2012 DATE OF DISCHARGE:  12/11/2012   ADMITTING DIAGNOSIS: Lower gastrointestinal bleed.  DISCHARGE DIAGNOSES:  1.  Lower gastrointestinal bleed, likely diverticular from splenic flexure colon area.  2.  Acute posthemorrhagic anemia, status post 1 unit of packed red blood cell transfusion.  3.  Left lower quadrant abdominal pain, likely due to gastrointestinal bleeding.  4.  Hypertension. 5.  End-stage renal disease, on hemodialysis Mondays, Wednesdays, Fridays. Last hemodialysis was on 12/10/2012. The patient was supposed to get hemodialysis on 12/11/2012; however, he was not able to get that. 6.  History of anemia of chronic disease and now anemia of acute posthemorrhagic bleed.  7.  Secondary hyperparathyroidism. 8.  Hyperkalemia with potassium level of 5.8 on 12/11/2012.   DISCHARGE CONDITION: Stable.   DISCHARGE MEDICATIONS: The patient is to resume his outpatient medications which are PhosLo 667 mg 4 capsules 3 times daily with meals, Sensipar 180 mg p.o. once daily. He is not to use any aspirin or nonsteroidal inflammatory medications for the next 1 week after discharge. The patient is being transferred to [facility], Intensive Care Unit, Dr. Jimmye Norman' service.   CONSULTANTS: Dr. Dionne Milo, gastroenterologist. Dr. Leotis Pain, vascular surgery. Dr. Anthonette Legato, nephrology.   RADIOLOGIC STUDIES: GI blood loss study (nuclear medicine), 12/11/2012, showed abnormal study showing active gastrointestinal hemorrhage that appears to be primary in distal transverse colon in the region of splenic flexure.   HISTORY OF PRESENT ILLNESS: The patient is a 54 year old African American male with past medical history significant for history of end-stage renal disease who presented to the hospital on 12/10/2012 with complaints of bright red blood per rectum. Please refer to Dr. Graciela Husbands  admission note on 12/10/2012. On arrival to the hospital, the patient's vital signs showed temperature of 98.8. Pulse was 106. Respiration rate was 20. Blood pressure was 181/76. Saturation was 97% on room air. Physical exam was unremarkable. The patient's lab data initially in the Emergency Room revealed BUN and creatinine of 59 and 14.95. Glucose 113, otherwise BMP was unremarkable. The patient's liver enzymes were unremarkable. The patient's white blood cell count was normal at 4.5; hemoglobin was 9.6, platelet count 154. Coagulation panel was normal with pro time of 13.8, INR 1.0. EKG showed sinus tach at 101 beats per minutes, normal axis, possible anterior infarct age undetermined. Nonspecific T wave abnormality evident in inferior leads as well as inverted T waves in lateral leads were noted. Chest x-ray was not performed. The patient was admitted to the hospital and initially, the patient was planned to get nuclear medicine study for bleeding scan; however, the patient's bleeding stopped. Hemoglobin was cycled and as mentioned above, the patient's hemoglobin level was 9.6 on day of admission, 12/09/2012, however, dropped down to 8.1 on 12/10/2012, went even lower to 7.6 at noon on 12/10/2012. However, the patient kept refusing transfusion. Rechecking his hemoglobin, it appeared that the patient's hemoglobin level remained stable. On 12/11/2012, it was 7.4. However, the patient had recurrent GI bleed and had significant hemoglobin drop to 6.5 at 2:30 on 12/11/2012. During this bleed, he was transferred to CCU. He was tachycardic with heart rate around 122. He was not hypotensive with systolic blood pressure around 130. He, however, felt somewhat funny. He was tachycardic and requested IV fluid administration. He was also agreeable for packed red blood cell transfusion. A bleeding scan was obtained, which was positive for acute bleeding  in the splenic flexure area, and the patient was advised to get vascular  surgery consultation, which he was agreeable for. Dr. Lucky Cowboy saw the patient in consultation and recommended vascular intervention. However, the patient refused treatment and decided to ask to be transferred to a different facility. A transfer to [facility] was arranged and Dr. Jimmye Norman graciously accepted this patient to Intensive Care Unit. The patient is being discharged to Intensive Care Unit in stable condition with the above-mentioned medications and followup with his primary care physician.  CONDITION AT DISCHARGE: His vital signs on the day of discharge: Temperature 97.8, pulse 100, respiration rate 19, blood pressure 114/76. Saturation was 100% on 4 liters of oxygen through nasal cannula.   We were planning to get hemodialysis done today. However, hemodialysis was postponed to a later time. However, now since the patient is being discharged, hemodialysis is not performed. Of note, the patient's last potassium level was 5.8 in the morning on 12/11/2012. It is recommended to follow up and check his potassium level as soon as possible to ensure that it is stable. Of note, the patient's hemoglobin level was 6.5. He is being transfused today with 1 unit of packed red blood cells on 12/11/2012 and unfortunately, posttransfusion hemoglobin is not available at this time.   TIME SPENT: 40 minutes.    ____________________________ Theodoro Grist, MD rv:jm D: 12/11/2012 18:00:52 ET T: 12/11/2012 18:45:01 ET JOB#: [id number]  cc: Theodoro Grist, MD, <Dictator> Marsheila Alejo MD ELECTRONICALLY SIGNED 12/21/2012 14:08

## 2015-04-01 NOTE — H&P (Signed)
PATIENT NAME:  [name] [name], [name] [name] MR#:  [id number] DATE OF BIRTH:  08/01/1961  DATE OF ADMISSION:  12/10/2012  REFERRING PHYSICIAN: Graciella Freer, MD  PRIMARY CARE PHYSICIAN: None.  NEPHROLOGIST: Anthonette Legato, MD  CHIEF COMPLAINT: Bright red blood per rectum.   HISTORY OF PRESENT ILLNESS: This is a 54 year old male with significant past medical history of end-stage renal disease on hemodialysis Monday, Wednesday and Friday, malignant hypertension, history of polysubstance abuse and chronic right-sided pleural effusion who presents with bright red blood per rectum. The patient reports he has had multiple episodes of bright red blood per rectum, up to 8 episodes, that started at 8:00 p.m. yesterday. The patient denies any melena, any diarrhea, any constipation, any abdominal pain or any coffee-ground emesis. The patient reports he has never had history of lower GI bleed. As well, he never had colonoscopy or EGD done in the past. The patient's hemoglobin was done in the ED which was 9.7, which is around his baseline, which runs around 10. The patient was not hypotensive and actually was hypertensive. The patient as well had a couple episodes in the ED. He reports initially he has been having them so frequently, once every 15 minutes, but reports that they are becoming more distant. He denies any such previous bleeds in the past. Hospitalist service was requested to admit the patient for further management and work-up for his lower GI bleed.   PAST MEDICAL HISTORY: 1. History of end-stage renal disease on hemodialysis since 2007 on Monday, Wednesday and Friday.  2. History of polysubstance abuse.  3. Malignant hypertension.  4. Chronic right-sided pleural effusion.  SOCIAL HISTORY: The patient has history of smoking 1/2 pack per day, occasional alcohol use. As well as history of cocaine use.   FAMILY HISTORY: Significant for thyroid problems and chronic kidney in parents.    ALLERGIES: No known drug  allergies.   PAST SURGICAL HISTORY: History of AV shunt placement.   MEDICATIONS: 1. PhosLo 667 four caps 3 times a day with meals.  2. Sensipar 180 mg oral daily.   REVIEW OF SYSTEMS:  CONSTITUTIONAL: The patient denies any fever or chills. Complains of generalized weakness and fatigue.   EYES: Denies blurry vision, double vision or pain.   ENT: Denies tinnitus, ear pain, hearing loss or epistaxis.   RESPIRATORY: Denies cough, wheezing, hemoptysis, dyspnea, asthma or COPD.  CARDIOVASCULAR: Denies chest pain, orthopnea, edema, arrhythmia, palpitations or syncope.   GASTROINTESTINAL: Complains of nausea but denies any vomiting, diarrhea, abdominal pain, hematemesis, melena or jaundice. Has bright red blood per rectum. Denies any diarrhea or constipation.   GENITOURINARY: Denies dysuria, hematuria or renal colic.  ENDOCRINE: Denies polyuria, polydipsia or heat or cold intolerance.   HEMATOLOGY: Denies anemia, easy bruising or bleeding diathesis.   INTEGUMENTARY: Denies acne, rash or skin lesions.   MUSCULOSKELETAL: Denies any gout, redness, limited activity, arthritis or cramps.   NEUROLOGIC: Denies, tremors, vertigo, ataxia, dementia or epilepsy.   PSYCHIATRIC: Denies anxiety, insomnia, nervousness, schizophrenia or bipolar disorder. Has history of substance abuse.   PHYSICAL EXAMINATION:  VITAL SIGNS: Temperature 98.8, pulse 106, respiratory rate 20, blood pressure 181/76 and saturating 97% on room air.   GENERAL: Well-nourished male who looks comfortable in bed, in no apparent distress.   HEENT: Head is atraumatic, normocephalic. Pupils are equally round and reactive to light. Pink conjunctivae. Anicteric sclerae. Moist oral mucosa.   NECK: Supple. No thyromegaly. No JVD.   PULMONARY: Chest has good air entry bilaterally. No wheezing, rales  or rhonchi.   CARDIOVASCULAR: S1 and S2 heard. No rubs, murmurs or gallops.   ABDOMEN: Soft, nontender and nondistended. Bowel  sounds present.   EXTREMITIES: No edema. No clubbing. No cyanosis.   PSYCHIATRIC: Appropriate affect. Awake and alert x 3. Intact judgment and insight.   NEUROLOGIC: Cranial nerves grossly intact. Motor 5 out of 5 in all extremities. Sensation symmetrical.   SKIN: Normal skin turgor, warm and dry.   PERTINENT LABS: Glucose 113, BUN 59, creatinine 14.95, sodium 141, potassium 4.3, chloride 101, CO2 28, anion gap 12, total protein 8.2, total bilirubin 0.3, alkaline phosphatase 100, AST 13 and ALT 14. White blood cells 4.5, hemoglobin 9.6, hematocrit 32.1 and platelets 154.  EKG is showing normal sinus rhythm with Q wave in lead V1 with poor progression in V2 and V3.   ASSESSMENT AND PLAN: This is a 54 year old male with history of end-stage renal disease who presents with bright blood per rectum.  1. Bright red blood per rectum. This is most likely related to lower GI bleed, unlikely upper GI bleed. The patient has no melena, no dark stools and no coffee-ground emesis. Given the fact he has end-stage renal disease, we will be giving desmopressin empirically for uremic bleeding. As well we will monitor H and H every 6 hours. We will type and screen. We will transfuse as needed and discuss with Dr. Dionne Milo. We will obtain a bleeding scan and we will have the patient started on bowel prep for possible need of colonoscopy in a.m. The patient will be admitted to the telemetry unit.  2. Endstage renal disease.  We will consult nephrology service for hemodialysis as he is on his regular schedule of Monday, Wednesday and Friday.  3. Hypertension. Appears to be uncontrolled, but given the fact the patient is not on any home medication, as well presents with active GI bleed, we will hold on starting any new medications. We will continue to monitor and if needed we will start him on p.r.n. hydralazine.  4. DVT prophylaxis. Sequential compression device.  5. GI prophylaxis. On PPI.   CODE STATUS: FULL CODE.    TOTAL TIME SPENT ON ADMISSION AND PATIENT CARE: 55 minutes.  ____________________________ Albertine Patricia, MD dse:sb D: 12/10/2012 02:36:27 ET T: 12/10/2012 07:44:49 ET JOB#: [id number]  cc: Albertine Patricia, MD, <Dictator> Mylisa Brunson Graciela Husbands MD ELECTRONICALLY SIGNED 12/11/2012 0:42

## 2015-04-01 NOTE — Consult Note (Signed)
Patient with ESRD, admitted with 2 day history of painless lower GI bleeding.  Has drop in Hgb, and a bleeding scan was done today.  This demonstrated activie bleeding in the area of the splenic flexure.  Discussed with patient that options for treatment would include observation, surgical resection, and embolization.  With active bleeding, invasive therapy would be warranted and embolization would be much less invasive than surgical resection.  Risks of continued or rebleeding, ischemia, access site issues all discussed.  Patient to discuss with his mother but voices understanding.  Plan embolization today  Electronic Signatures: Algernon Huxley (MD)  (Signed on 02-Jan-14 13:32)  Authored  Last Updated: 02-Jan-14 13:32 by Algernon Huxley (MD)

## 2015-04-01 NOTE — Consult Note (Signed)
PATIENT NAME:  [name] [name], [name] [name] MR#:  [id number] DATE OF BIRTH:  1961-07-23  DATE OF CONSULTATION:  12/10/2012  CONSULTING PHYSICIAN:  Jill Side, MD  REASON FOR CONSULTATION:  GI bleed.   HISTORY OF PRESENT ILLNESS:  This is a 54 year old male with past medical history of end-stage renal disease on dialysis, malignant hypertension, polysubstance abuse, chronic right-sided pleural effusion. The patient was admitted early this morning when he presented to the hospital with multiple episodes of bright red blood per rectum. Hemoglobin was 9.7 which is close to his baseline. Initially, he was having frequent bloody bowel movements, but for the last several hours, he has not had any bowel movements. Denies any other symptoms. He has no prior history of GI bleed and has never had an EGD or a colonoscopy in the past.   PAST MEDICAL HISTORY:  Significant for a history of end-stage renal disease on dialysis, history of polysubstance abuse, malignant hypertension and chronic right-sided pleural effusion.   SOCIAL HISTORY:  He has a history of smoking 1/2 pack per day, occasional alcohol abuse, also history of cocaine use.   ALLERGIES:  None.   PAST SURGICAL HISTORY:  AV shunt placement.   HOME MEDICATIONS:  Sensipar and PhosLo.   REVIEW OF SYSTEMS:  Negative except for what is mentioned in the history of present illness.   PHYSICAL EXAMINATION: GENERAL: Well-built male who does not appear to be in distress.  VITAL SIGNS: Temperature 98.2, heart rate is in 80s and 90s, respirations 18 to 20, blood pressure 148/89.  LUNGS: Grossly clear to auscultation bilaterally with fair entry.  CARDIOVASCULAR: Regular rate and rhythm.  ABDOMEN: Slightly distended abdomen which is otherwise soft. Bowel sounds are positive. No significant tenderness or rebound was noted.  NEUROLOGIC: Examination appears to be unremarkable.   LABORATORY DATA:  Hemoglobin on admission was 9.6 and it has slowly dropped down to  7.6, platelet count 154, white cell count of 4.5. BUN is 59, creatinine 14.9. The rest of the electrolytes are normal as well as liver enzymes. PT and INR are within normal limits   ASSESSMENT AND PLAN:  The patient is with hematochezia with a drop in his hemoglobin and hematocrit. He is hemodynamically stable and has not had any bloody bowel movements in the last several hours. Most likely, we are dealing with diverticular bleeding which now seems to have resolved. The patient is hemodynamically stable. Initially, attempts were made to prep the patient last night for a colonoscopy, but he drank only one-third of his GoLYTELY. Later on, plans were made for him to be dialyzed today so we can perform a colonoscopy tomorrow, but it appears that the dialysis has been canceled as well and will be done tomorrow. Since there are no signs of active gastrointestinal bleeding and performing a colonoscopy after a missed dialysis or shortly after dialysis is not without risk, we will continue to observe him closely, follow hemoglobin and hematocrit and transfuse if needed. The patient is refusing blood transfusion at this point, but will be willing to accept a transfusion if the hemoglobin drops below 7. Further recommendations and timing of colonoscopy will be decided based on the patient's hospital course over the next several hours. We will follow.    ____________________________ Jill Side, MD si:si D: 12/10/2012 15:31:51 ET T: 12/10/2012 17:42:51 ET JOB#: [id number]  cc: Jill Side, MD, <Dictator> Jill Side MD ELECTRONICALLY SIGNED 12/17/2012 11:13

## 2015-04-01 NOTE — Consult Note (Signed)
PATIENT NAME:  [name] [name], [name] [name] MR#:  [id number] DATE OF BIRTH:  Mar 22, 1961  DATE OF CONSULTATION:  12/11/2012  REFERRING PHYSICIAN:  Theodoro Grist, MD CONSULTING PHYSICIAN:  Algernon Huxley, MD  REASON FOR CONSULTATION: Gastrointestinal bleed.   HISTORY OF PRESENT ILLNESS: This is a 54 year old African American male with longstanding end-stage renal disease on hemodialysis Mondays, Wednesdays, and Fridays, he has a history of malignant hypertension, and chronic right-sided pleural effusion, and he presents with a 2-day history of bright red blood per rectum. This has been multiple episodes. He is continuing to have active bleeding. He has not had any coffee-ground emesis. This has been painless. Never had a history of lower GI bleeding before this. His baseline hemoglobin was apparently around 10. As his bleeding has continued and persisted, a bleeding scan was performed. This demonstrated active bleeding around the area of the splenic flexure. We were asked to see the patient for evaluation for potential embolization.   PAST MEDICAL HISTORY:  1. End-stage renal disease.  2. Malignant hypertension.  3. Chronic right-sided pleural effusion.  4. History of polysubstance abuse.   SOCIAL HISTORY: He smokes about one-half pack per day and occasional alcohol use. Has a history of cocaine use.   FAMILY HISTORY: Significant for kidney problems and thyroid disease.   ALLERGIES: None known.   PAST SURGICAL HISTORY: Dialysis access placements.   MEDICATIONS: Sensipar 180 mg daily and PhosLo 667 mg caps 4 caps t.i.d.   REVIEW OF SYSTEMS: GENERAL: No fevers or chills. No intentional weight loss or gain.  EYES: No blurry or double vision.  EARS: No tinnitus or ear pain.  CARDIAC: No chest pain or palpitations.  RESPIRATORY: No shortness breath or cough.  GASTROINTESTINAL: As per HPI. GENITOURINARY: No dysuria or hematuria. Positive for end-stage renal disease.  ENDOCRINE: No heat or cold  intolerance. PSYCHIATRIC: No anxiety or depression.  NEUROLOGIC: No TIA, stroke, or seizure.  SKIN: No new rash or ulcers.   PHYSICAL EXAMINATION:  GENERAL: This is a well-developed, well-nourished [place] American male not in apparent distress.  VITAL SIGNS: Temperature 98.1, pulse is currently 111, blood pressure is 137/83, saturations are 97% on room air.  HEAD: Normocephalic and atraumatic.  EYES: Sclerae anicteric. Conjunctivae are clear.  EARS: Normal external appearance. Hearing is intact.  NECK: Supple without adenopathy or JVD. HEART: Tachycardic and somewhat irregular.  ABDOMEN: Soft, nondistended, nontender.  EXTREMITIES: Warm and well perfused. He has a graft in the right upper arm. NEUROLOGIC: No focal weakness or numbness. Moves all 4 extremities spontaneously. PSYCHIATRIC: Somewhat flat affect, normal mood.  SKIN: Warm and dry.   LABORATORY EVALUATIONS: Reveal a sodium of 140, potassium is 5.8, chloride is 101, CO2 is 29, BUN is 72, creatinine is 18.4, glucose 102. White blood cell count is 4.4, hemoglobin is 7.4, platelet count is 123,000. Bleeding scan as described above with positive for focus of bleeding in the splenic flexure region.   ASSESSMENT AND PLAN: This is a 54 year old African American male with end-stage renal disease who has active gastrointestinal bleeding with a positive bleeding scan. We discussed the options for treatment including observation and surgical resection or embolization. Embolization was certainly a lower risk procedure with less morbidity than surgical resection. We discussed the risks of continued bleeding or re-bleeding. We discussed the risks of ischemia. We discussed that ultimately surgical therapy is required in the few percent of patients and that complications can arise from the embolization. With active gastrointestinal bleeding, invasive therapy is certainly warranted  at this time. We will plan on proceeding with GI embolization today.    This is a level 4 consultation.    ____________________________ Algernon Huxley, MD jsd:es D: 12/11/2012 13:46:47 ET T: 12/11/2012 14:35:54 ET JOB#: [id number]  cc: Algernon Huxley, MD, <Dictator> Algernon Huxley MD ELECTRONICALLY SIGNED 12/15/2012 14:14

## 2015-04-16 ENCOUNTER — Encounter: Payer: Self-pay | Admitting: Emergency Medicine

## 2015-04-16 ENCOUNTER — Emergency Department: Payer: Medicare Other

## 2015-04-16 ENCOUNTER — Inpatient Hospital Stay
Admission: EM | Admit: 2015-04-16 | Discharge: 2015-04-18 | DRG: 871 | Disposition: A | Discharge status: Home / Self Care | Payer: Medicare Other | Attending: Internal Medicine | Admitting: Internal Medicine

## 2015-04-16 DIAGNOSIS — Z79899 Other long term (current) drug therapy: Secondary | ICD-10-CM

## 2015-04-16 DIAGNOSIS — Z992 Dependence on renal dialysis: Secondary | ICD-10-CM | POA: Diagnosis not present

## 2015-04-16 DIAGNOSIS — I509 Heart failure, unspecified: Secondary | ICD-10-CM | POA: Diagnosis present

## 2015-04-16 DIAGNOSIS — N186 End stage renal disease: Secondary | ICD-10-CM | POA: Diagnosis present

## 2015-04-16 DIAGNOSIS — J189 Pneumonia, unspecified organism: Secondary | ICD-10-CM | POA: Diagnosis present

## 2015-04-16 DIAGNOSIS — F17210 Nicotine dependence, cigarettes, uncomplicated: Secondary | ICD-10-CM | POA: Diagnosis present

## 2015-04-16 DIAGNOSIS — A419 Sepsis, unspecified organism: Principal | ICD-10-CM | POA: Diagnosis present

## 2015-04-16 DIAGNOSIS — R0902 Hypoxemia: Secondary | ICD-10-CM | POA: Diagnosis present

## 2015-04-16 DIAGNOSIS — I120 Hypertensive chronic kidney disease with stage 5 chronic kidney disease or end stage renal disease: Secondary | ICD-10-CM | POA: Diagnosis present

## 2015-04-16 DIAGNOSIS — D631 Anemia in chronic kidney disease: Secondary | ICD-10-CM | POA: Diagnosis present

## 2015-04-16 DIAGNOSIS — N2581 Secondary hyperparathyroidism of renal origin: Secondary | ICD-10-CM | POA: Diagnosis present

## 2015-04-16 LAB — CBC WITH DIFFERENTIAL/PLATELET
Basophils Absolute: 0 10*3/uL (ref 0–0.1)
Basophils Relative: 0 %
Eosinophils Absolute: 0 10*3/uL (ref 0–0.7)
Eosinophils Relative: 0 %
HCT: 34.2 % — ABNORMAL LOW (ref 40.0–52.0)
Hemoglobin: 10.7 g/dL — ABNORMAL LOW (ref 13.0–18.0)
Lymphocytes Relative: 10 %
Lymphs Abs: 0.9 10*3/uL — ABNORMAL LOW (ref 1.0–3.6)
MCH: 30.2 pg (ref 26.0–34.0)
MCHC: 31.4 g/dL — ABNORMAL LOW (ref 32.0–36.0)
MCV: 96.2 fL (ref 80.0–100.0)
MONOS PCT: 12 %
Monocytes Absolute: 1.1 10*3/uL — ABNORMAL HIGH (ref 0.2–1.0)
Neutro Abs: 7 10*3/uL — ABNORMAL HIGH (ref 1.4–6.5)
Neutrophils Relative %: 78 %
PLATELETS: 128 10*3/uL — AB (ref 150–440)
RBC: 3.56 MIL/uL — ABNORMAL LOW (ref 4.40–5.90)
RDW: 16 % — ABNORMAL HIGH (ref 11.5–14.5)
WBC: 9 10*3/uL (ref 3.8–10.6)

## 2015-04-16 LAB — COMPREHENSIVE METABOLIC PANEL
ALBUMIN: 3.9 g/dL (ref 3.5–5.0)
ALT: 10 U/L — AB (ref 17–63)
AST: 16 U/L (ref 15–41)
Alkaline Phosphatase: 51 U/L (ref 38–126)
Anion gap: 13 (ref 5–15)
BUN: 37 mg/dL — ABNORMAL HIGH (ref 6–20)
CO2: 30 mmol/L (ref 22–32)
Calcium: 9.1 mg/dL (ref 8.9–10.3)
Chloride: 94 mmol/L — ABNORMAL LOW (ref 101–111)
Creatinine, Ser: 11.66 mg/dL — ABNORMAL HIGH (ref 0.61–1.24)
GFR calc Af Amer: 5 mL/min — ABNORMAL LOW (ref >60)
GFR calc non Af Amer: 4 mL/min — ABNORMAL LOW (ref >60)
Glucose, Bld: 113 mg/dL — ABNORMAL HIGH (ref 65–99)
Potassium: 4.3 mmol/L (ref 3.5–5.1)
SODIUM: 137 mmol/L (ref 135–145)
TOTAL PROTEIN: 8.3 g/dL — AB (ref 6.5–8.1)
Total Bilirubin: 0.7 mg/dL (ref 0.3–1.2)

## 2015-04-16 MED ORDER — CINACALCET HCL 30 MG PO TABS
90.0000 mg | ORAL_TABLET | Freq: Every day | ORAL | Status: DC
  Administered 2015-04-16: 90 mg via ORAL
  Filled 2015-04-16 (×2): qty 3

## 2015-04-16 MED ORDER — HEPARIN SODIUM (PORCINE) 5000 UNIT/ML IJ SOLN
5000.0000 [IU] | Freq: Three times a day (TID) | INTRAMUSCULAR | Status: DC
  Administered 2015-04-16 – 2015-04-18 (×6): 5000 [IU] via SUBCUTANEOUS
  Filled 2015-04-16 (×6): qty 1

## 2015-04-16 MED ORDER — DEXTROSE 5 % IV SOLN
1.0000 g | Freq: Once | INTRAVENOUS | Status: AC
  Administered 2015-04-16: 1 g via INTRAVENOUS

## 2015-04-16 MED ORDER — DEXTROSE 5 % IV SOLN
500.0000 mg | INTRAVENOUS | Status: DC
  Administered 2015-04-16 – 2015-04-17 (×2): 500 mg via INTRAVENOUS
  Filled 2015-04-16 (×4): qty 500

## 2015-04-16 MED ORDER — CALCIUM ACETATE 667 MG PO CAPS
2668.0000 mg | ORAL_CAPSULE | Freq: Three times a day (TID) | ORAL | Status: DC
  Administered 2015-04-17 – 2015-04-18 (×2): 2668 mg via ORAL
  Filled 2015-04-16 (×10): qty 4

## 2015-04-16 MED ORDER — METOPROLOL SUCCINATE ER 50 MG PO TB24
50.0000 mg | ORAL_TABLET | Freq: Every day | ORAL | Status: DC
  Administered 2015-04-16 – 2015-04-17 (×2): 50 mg via ORAL
  Filled 2015-04-16 (×2): qty 1

## 2015-04-16 MED ORDER — BISMUTH SUBSALICYLATE 262 MG/15ML PO SUSP
30.0000 mL | ORAL | Status: DC | PRN
  Filled 2015-04-16: qty 118

## 2015-04-16 MED ORDER — ZOLPIDEM TARTRATE 5 MG PO TABS
5.0000 mg | ORAL_TABLET | Freq: Every evening | ORAL | Status: DC | PRN
  Filled 2015-04-16: qty 1

## 2015-04-16 MED ORDER — ONDANSETRON HCL 4 MG/2ML IJ SOLN
4.0000 mg | Freq: Four times a day (QID) | INTRAMUSCULAR | Status: DC | PRN
  Administered 2015-04-16 – 2015-04-17 (×2): 4 mg via INTRAVENOUS
  Filled 2015-04-16 (×2): qty 2

## 2015-04-16 MED ORDER — AZITHROMYCIN 500 MG IV SOLR
INTRAVENOUS | Status: AC
  Administered 2015-04-16: 500 mg via INTRAVENOUS
  Filled 2015-04-16: qty 500

## 2015-04-16 MED ORDER — ALUM & MAG HYDROXIDE-SIMETH 200-200-20 MG/5ML PO SUSP
30.0000 mL | Freq: Once | ORAL | Status: AC
  Administered 2015-04-16: 30 mL via ORAL
  Filled 2015-04-16: qty 30

## 2015-04-16 MED ORDER — CEFTRIAXONE SODIUM IN DEXTROSE 20 MG/ML IV SOLN
1.0000 g | INTRAVENOUS | Status: DC
  Administered 2015-04-17: 1 g via INTRAVENOUS
  Filled 2015-04-16 (×3): qty 50

## 2015-04-16 MED ORDER — DEXTROSE 5 % IV SOLN
INTRAVENOUS | Status: AC
  Administered 2015-04-16: 1 g via INTRAVENOUS
  Filled 2015-04-16: qty 10

## 2015-04-16 MED ORDER — DEXTROSE 5 % IV SOLN
500.0000 mg | Freq: Once | INTRAVENOUS | Status: AC
  Administered 2015-04-16: 500 mg via INTRAVENOUS

## 2015-04-16 MED ORDER — ACETAMINOPHEN 325 MG PO TABS
650.0000 mg | ORAL_TABLET | Freq: Four times a day (QID) | ORAL | Status: DC | PRN
  Administered 2015-04-16 – 2015-04-17 (×2): 650 mg via ORAL
  Filled 2015-04-16 (×2): qty 2

## 2015-04-16 NOTE — ED Notes (Signed)
Pt to room from xray

## 2015-04-16 NOTE — ED Provider Notes (Signed)
[facility] Emergency Department Provider Note  ____________________________________________  Time seen: 12 PM  I have reviewed the triage vital signs and the nursing notes.   HISTORY  Chief Complaint Code Sepsis   HPI [name] [name] is a 54 y.o. male who presents with chills and fever and diffuse weakness. He does report some shortness of breath and a mild cough. He started feeling this way yesterday after dialysis, patient has end-stage renal disease gets dialysis Monday Wednesday Friday (dr. Sherlynn Carbon). Denies abdominal pain no chest pain. Does not make urine. No rash. No sick contacts.Patient does report a history of pneumonia but has not been in a hospital in the last 3 months     Past Medical History  Diagnosis Date  . Secondary hyperparathyroidism   . Hypertension   . Polysubstance abuse   . Anemia of chronic disease     BL Hgb ~10  . Tobacco abuse   . CHF (congestive heart failure)     pt denies 04/01/14  . Pneumonia     hx of  . End-stage renal disease on hemodialysis     HD m/w/f - right arm graft  . History of blood transfusion   . Pneumonia   . Sleep disturbance     Patient Active Problem List   Diagnosis Date Noted  . S/P thoracotomy 03/04/2013  . 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 Bacteremia 01/06/2013  . Benign neoplasm of colon 12/16/2012  . Diverticulosis of colon (without mention of hemorrhage) 12/16/2012  . ESRD (end stage renal disease) 12/12/2012  . H/O: hypertension 12/12/2012  . Pleural effusion, right 12/12/2012  . Hyperkalemia 12/11/2012  . Acute blood loss anemia 12/11/2012  . Sinus tachycardia, resolved with PRBC resuscitation 12/11/2012  . Lower GI bleed 12/11/2012  . End-stage renal disease on hemodialysis   . Secondary hyperparathyroidism   . Hypertension   . Polysubstance abuse   . Anemia of chronic disease   . Tobacco abuse     Past Surgical History  Procedure Laterality Date  . Surgery for  horseshoe kidney    . Colonoscopy  12/16/2012    Procedure: COLONOSCOPY;  Surgeon: Milus Banister, MD;  Location: [place];  Service: Endoscopy;  Laterality: N/A;  . Video assisted thoracoscopy  12/18/2012    Procedure: VIDEO ASSISTED THORACOSCOPY;  Surgeon: Melrose Nakayama, MD;  Location: [place];  Service: Thoracic;  Laterality: Right;  pleural peel  . Pleural effusion drainage  12/18/2012    Procedure: DRAINAGE OF PLEURAL EFFUSION;  Surgeon: Melrose Nakayama, MD;  Location: [place];  Service: Thoracic;  Laterality: Right;  . Video bronchoscopy with insertion of interbronchial valve (ibv)  01/09/2013    Procedure: VIDEO BRONCHOSCOPY WITH INSERTION OF INTERBRONCHIAL VALVE (IBV);  Surgeon: Melrose Nakayama, MD;  Location: [place];  Service: Thoracic;  Laterality: N/A;  placement of 2 Intrabronchial valves in right lower lobe  . Tee without cardioversion N/A 01/16/2013    Procedure: TRANSESOPHAGEAL ECHOCARDIOGRAM (TEE);  Surgeon: Sanda Klein, MD;  Location: [place];  Service: Cardiovascular;  Laterality: N/A;  . [place] pleurodesis Right 02/05/2013    Procedure: Pietro Cassis;  Surgeon: Melrose Nakayama, MD;  Location: [place];  Service: Thoracic;  Laterality: Right;  . Video bronchoscopy with insertion of interbronchial valve (ibv) Right 02/19/2013    Procedure: VIDEO BRONCHOSCOPY WITH INSERTION OF INTERBRONCHIAL VALVE (IBV);  Surgeon: Melrose Nakayama, MD;  Location: [place];  Service: Thoracic;  Laterality: Right;  removal of two  interbronchial valves right chest  . Video assisted thoracoscopy (vats)/thorocotomy Right 02/19/2013    Procedure: VIDEO ASSISTED THORACOSCOPY (VATS)/THOROCOTOMY;  Surgeon: Melrose Nakayama, MD;  Location: [place];  Service: Thoracic;  Laterality: Right;  Right Video Assisted Thoracoscopy, Right Thoracotomy, Decortication,   . Chest tube insertion  02/2013  . Appendectomy      Current Outpatient Rx  Name  Route  Sig  Dispense  Refill  . amLODipine  (NORVASC) 10 MG tablet   Oral   Take 1 tablet (10 mg total) by mouth daily.   60 tablet   2   . calcium acetate (PHOSLO) 667 MG capsule   Oral   Take 2,668 mg by mouth 3 (three) times daily with meals.          . cinacalcet (SENSIPAR) 90 MG tablet   Oral   Take 90 mg by mouth daily.         . metoprolol succinate (TOPROL-XL) 50 MG 24 hr tablet   Oral   Take 50 mg by mouth daily. Take with or immediately following a meal.         . zolpidem (AMBIEN CR) 6.25 MG CR tablet   Oral   Take 6.25 mg by mouth at bedtime as needed for sleep.           Allergies Tape  Family History  Problem Relation Age of Onset  . Thyroid disease Mother   . Renal Disease Maternal Grandmother   . Diabetes Maternal Grandmother   . Hypertension Maternal Aunt   . Hypertension Maternal Uncle     Social History History  Substance Use Topics  . Smoking status: Current Every Day Smoker -- 0.00 packs/day for 20 years  . Smokeless tobacco: Current User  . Alcohol Use: No     Comment: occasional    Review of Systems  Constitutional: Negative for fever. Eyes: Negative for visual changes. ENT: Negative for sore throat. Cardiovascular: Negative for chest pain. Respiratory: Negative for shortness of breath. Gastrointestinal: Negative for abdominal pain, vomiting and diarrhea. Genitourinary: Negative for dysuria. Musculoskeletal: Negative for back pain. Skin: Negative for rash. Neurological: Negative for headaches, focal weakness or numbness.   10-point ROS otherwise negative.  ____________________________________________   PHYSICAL EXAM:  VITAL SIGNS: ED Triage Vitals  Enc Vitals Group     BP 04/16/15 1103 153/96 mmHg     Pulse Rate 04/16/15 1103 128     Resp 04/16/15 1103 20     Temp 04/16/15 1103 101.4 F (38.6 C)     Temp Source 04/16/15 1103 Oral     SpO2 04/16/15 1103 93 %     Weight 04/16/15 1103 220 lb (99.791 kg)     Height 04/16/15 1103 6' (1.829 m)     Head Cir  --      Peak Flow --      Pain Score 04/16/15 1105 0     Pain Loc --      Pain Edu? --      Excl. in [place]? --      Constitutional: Alert and oriented. No acute distress Eyes: Conjunctivae are normal. PERRL. Normal extraocular movements. ENT   Head: Normocephalic and atraumatic.   Nose: No congestion/rhinnorhea.   Mouth/Throat: Mucous membranes are moist.   Neck: No stridor. Hematological/Lymphatic/Immunilogical: No cervical lymphadenopathy. Cardiovascular: Tachycardic regular rhythm. Normal and symmetric distal pulses are present in all extremities. No murmurs, rubs, or gallops. Respiratory: Normal respiratory effort without tachypnea nor retractions. Breath sounds are  clear and equal bilaterally. No wheezes or rhonchi heard. Gastrointestinal: Soft and nontender. No distention. There is no CVA tenderness. Genitourinary: deferred Musculoskeletal: Nontender with normal range of motion in all extremities.   No lower extremity tenderness nor edema. Positive thrill in av fistula right upper arm Neurologic:  Normal speech and language. No gross focal neurologic deficits are appreciated. Speech is normal.  Skin:  Skin is warm, dry and intact. No rash noted. Psychiatric: Mood and affect are normal. Speech and behavior are normal. Patient exhibits appropriate insight and judgment.  ____________________________________________    LABS (pertinent positives/negatives)  Labs significant for elevated creatinine but potassium is normal. White blood cell count is normal  ____________________________________________   EKG  ED ECG REPORT   Date: 04/16/2015  EKG Time: 1258 pm   Rate: 108   Rhythm: nonspecific ST and T waves changes, sinus tachycardia  Axis: normal  Intervals:none  ST&T Change: non specific changes   ____________________________________________    RADIOLOGY  Chest x-ray concerning for early  pneumonia  ____________________________________________   PROCEDURES  Procedure(s) performed: None  Critical Care performed: None  ____________________________________________   INITIAL IMPRESSION / ASSESSMENT AND PLAN / ED COURSE  Pertinent labs & imaging results that were available during my care of the patient were reviewed by me and considered in my medical decision making (see chart for details).  Patient's presentation consistent with sepsis. However does not meet code Sepsis criteria given no evidence of end organ injury. Normal saline IV, Rocephin and azithromycin IV for stated pneumonia. Will admit for further eval given patient is hypoxic. ____________________________________________   FINAL CLINICAL IMPRESSION(S) / ED DIAGNOSES  Final diagnoses:  Community acquired pneumonia     Lavonia Drafts, MD 04/16/15 1317

## 2015-04-16 NOTE — ED Notes (Addendum)
Pt reports nausea and vomiting today; reports abdominal pain that started yesterday. Pt also reports fever and weakness since yesterday. Pt alert and oriented in room, skin hot and dry to touch.

## 2015-04-16 NOTE — ED Notes (Signed)
Fever, weakness, nausea and vomiting

## 2015-04-16 NOTE — H&P (Signed)
[facility] NAME: [name] [name]    MR#:  [id number]  DATE OF BIRTH:  1961/01/19  DATE OF ADMISSION:  04/16/2015  PRIMARY CARE PHYSICIAN: Dr. Juleen China  REQUESTING/REFERRING PHYSICIAN: Dr. Corky Downs  CHIEF COMPLAINT:   Chief Complaint  Patient presents with  . Code Sepsis    cough, congestion, nausea and fever since yesterday after leaving dialysis    HISTORY OF PRESENT ILLNESS:  [name] [name]  is a 54 y.o. male with a known history of end-stage renal disease on Monday Wednesday Friday hemodialysis, hypertension, anemia of chronic disease, tobacco use disorder, comes from home secondary to worsening fever and chills that started since yesterday. Had dialysis yesterday and as he was about to come off of dialysis he started to have chills and fevers, had cough congestion and not prevent home to see if he would get any better. Overnight he couldn't get comfortable he was Having chills, generalized abdominal pain and some diarrhea. Nauseous and throwing up yesterday but no nausea or vomiting or diarrhea today. In the hospital he is noted to have a fever of 101F, chest x-ray did show possible consolidation in left lower lobe and he is being admitted for pneumonia.  PAST MEDICAL HISTORY:   Past Medical History  Diagnosis Date  . Secondary hyperparathyroidism   . Hypertension   . Polysubstance abuse   . Anemia of chronic disease     BL Hgb ~10  . Tobacco abuse   . CHF (congestive heart failure)     pt denies 04/01/14  . Pneumonia     hx of  . End-stage renal disease on hemodialysis     HD m/w/f - right arm graft  . History of blood transfusion   . Pneumonia   . Sleep disturbance     PAST SURGICAL HISTORY:   Past Surgical History  Procedure Laterality Date  . Surgery for horseshoe kidney    . Colonoscopy  12/16/2012    Procedure: COLONOSCOPY;  Surgeon: Milus Banister, MD;  Location: [place];  Service: Endoscopy;   Laterality: N/A;  . Video assisted thoracoscopy  12/18/2012    Procedure: VIDEO ASSISTED THORACOSCOPY;  Surgeon: Melrose Nakayama, MD;  Location: [place];  Service: Thoracic;  Laterality: Right;  pleural peel  . Pleural effusion drainage  12/18/2012    Procedure: DRAINAGE OF PLEURAL EFFUSION;  Surgeon: Melrose Nakayama, MD;  Location: [place];  Service: Thoracic;  Laterality: Right;  . Video bronchoscopy with insertion of interbronchial valve (ibv)  01/09/2013    Procedure: VIDEO BRONCHOSCOPY WITH INSERTION OF INTERBRONCHIAL VALVE (IBV);  Surgeon: Melrose Nakayama, MD;  Location: [place];  Service: Thoracic;  Laterality: N/A;  placement of 2 Intrabronchial valves in right lower lobe  . Tee without cardioversion N/A 01/16/2013    Procedure: TRANSESOPHAGEAL ECHOCARDIOGRAM (TEE);  Surgeon: Sanda Klein, MD;  Location: [place];  Service: Cardiovascular;  Laterality: N/A;  . Talc pleurodesis Right 02/05/2013    Procedure: Pietro Cassis;  Surgeon: Melrose Nakayama, MD;  Location: [place];  Service: Thoracic;  Laterality: Right;  . Video bronchoscopy with insertion of interbronchial valve (ibv) Right 02/19/2013    Procedure: VIDEO BRONCHOSCOPY WITH INSERTION OF INTERBRONCHIAL VALVE (IBV);  Surgeon: Melrose Nakayama, MD;  Location: [place];  Service: Thoracic;  Laterality: Right;  removal of two interbronchial valves right chest  . Video assisted thoracoscopy (vats)/thorocotomy Right 02/19/2013    Procedure: VIDEO ASSISTED THORACOSCOPY (VATS)/THOROCOTOMY;  Surgeon:  Melrose Nakayama, MD;  Location: [place];  Service: Thoracic;  Laterality: Right;  Right Video Assisted Thoracoscopy, Right Thoracotomy, Decortication,   . Chest tube insertion  02/2013  . Appendectomy      SOCIAL HISTORY:   History  Substance Use Topics  . Smoking status: Current Every Day Smoker -- 0.00 packs/day for 20 years  . Smokeless tobacco: Current User  . Alcohol Use: No     Comment: occasional   Smokes 1 1/2 Packs  every day  FAMILY HISTORY:   Family History  Problem Relation Age of Onset  . Thyroid disease Mother   . Renal Disease Maternal Grandmother   . Diabetes Maternal Grandmother   . Hypertension Maternal Aunt   . Hypertension Maternal Uncle     DRUG ALLERGIES:   Allergies  Allergen Reactions  . Tape Other (See Comments)    Plastic Tape Causes blister    REVIEW OF SYSTEMS:  CONSTITUTIONAL: Positive for fever, fatigue or weakness.  EYES: No blurred or double vision.  EARS, NOSE, AND THROAT: No tinnitus or ear pain.  RESPIRATORY: No cough, shortness of breath, wheezing or hemoptysis.  CARDIOVASCULAR: No chest pain, orthopnea, edema.  GASTROINTESTINAL: Positive for nausea, vomiting, No diarrhea , positive for  abdominal pain.  GENITOURINARY: Oliguric, No dysuria, hematuria.  ENDOCRINE: No polyuria, nocturia,  HEMATOLOGY: No anemia, easy bruising or bleeding SKIN: No rash or lesion. MUSCULOSKELETAL: No joint pain or arthritis.   NEUROLOGIC: No tingling, numbness, weakness.  PSYCHIATRY: No anxiety or depression.   MEDICATIONS AT HOME:   Prior to Admission medications   Medication Sig Start Date End Date Taking? Authorizing Provider  calcium acetate (PHOSLO) 667 MG capsule Take 2,668 mg by mouth 3 (three) times daily with meals.    Yes Historical Provider, MD  cinacalcet (SENSIPAR) 90 MG tablet Take 90 mg by mouth daily.   Yes Historical Provider, MD  metoprolol succinate (TOPROL-XL) 50 MG 24 hr tablet Take 50 mg by mouth daily. Take with or immediately following a meal.   Yes Historical Provider, MD  zolpidem (AMBIEN CR) 6.25 MG CR tablet Take 6.25 mg by mouth at bedtime as needed for sleep.   Yes Historical Provider, MD      VITAL SIGNS:  Blood pressure 144/86, pulse 105, temperature 101.4 F (38.6 C), temperature source Oral, resp. rate 26, height 6' (1.829 m), weight 99.791 kg (220 lb), SpO2 91 %.  PHYSICAL EXAMINATION:  GENERAL:  54 y.o.-year-old patient lying in the  bed with no acute distress.  EYES: Pupils equal, round, reactive to light and accommodation. No scleral icterus. Extraocular muscles intact.  HEENT: Head atraumatic, normocephalic. Oropharynx and nasopharynx clear.  NECK:  Supple, no jugular venous distention. No thyroid enlargement, no tenderness.  LUNGS: Normal breath sounds bilaterally, no wheezing, rales,rhonchi or crepitation. No use of accessory muscles of respiration.  CARDIOVASCULAR: S1, S2 normal. No murmurs, rubs, or gallops.  ABDOMEN: Soft, nontender, nondistended. Bowel sounds present. No organomegaly or mass.  EXTREMITIES: No pedal edema, cyanosis, or clubbing.  Right arm graft with good pulse present NEUROLOGIC: Cranial nerves II through XII are intact. Muscle strength 5/5 in all extremities. Sensation intact. Gait not checked.  PSYCHIATRIC: The patient is alert and oriented x 3.  SKIN: No obvious rash, lesion, or ulcer.   LABORATORY PANEL:   CBC  Recent Labs Lab 04/16/15 1115  WBC 9.0  HGB 10.7*  HCT 34.2*  PLT 128*   ------------------------------------------------------------------------------------------------------------------  Chemistries   Recent Labs Lab 04/16/15 1115  NA 137  K 4.3  CL 94*  CO2 30  GLUCOSE 113*  BUN 37*  CREATININE 11.66*  CALCIUM 9.1  AST 16  ALT 10*  ALKPHOS 51  BILITOT 0.7   ------------------------------------------------------------------------------------------------------------------  Cardiac Enzymes No results for input(s): TROPONINI in the last 168 hours. ------------------------------------------------------------------------------------------------------------------  RADIOLOGY:  Dg Chest 2 View  04/16/2015   CLINICAL DATA:  54 year old male with fever, weakness, shortness of breath and nausea beginning yesterday after leaving dialysis. Medical history includes a previous loculated right pleural effusion which was treated with video-assisted thoracoscopic surgery  and decortication. 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  EXAM: CHEST  2 VIEW  COMPARISON:  Prior chest x-ray 08/18/2013  FINDINGS: Stable mild cardiomegaly. Atherosclerotic calcifications present in the transverse aorta. The appearance of the right lung is similar compared to prior with chronic pleural thickening at the lung apex and at the lung base. There is no evidence of air in the right apex as seen on previous imaging suggesting resolution of the previously described bronchopleural fistula. There may be some minimal patchy airspace opacity in the left base on the frontal view. Mild pulmonary vascular congestion without overt edema similar compared to prior. No acute osseous abnormality.  IMPRESSION: 1. Perhaps mild patchy airspace opacity in the left base on the frontal view which may reflect atelectasis, residual scarring from prior disease, or early pneumonia. 2. Unchanged appearance of the right chest with residual pleural thickening versus loculated pleural effusion. No evidence of hydro pneumothorax to suggest recurrent bronchopleural fistula.   Electronically Signed   By: Jacqulynn Cadet M.D.   On: 04/16/2015 11:57    EKG:   Orders placed or performed during the hospital encounter of 04/16/15  . ED EKG  . ED EKG    IMPRESSION AND PLAN:   54 year old African-American male with past medical history significant for hypertension, end-stage renal disease on Monday Wednesday Friday hemodialysis, congestive heart failure, anemia of chronic disease presents to the hospital secondary to worsening fever chills and cough congestion.  #1 sepsis-likely source pneumonia. Cultures have been ordered. No other risk factors so will start on Rocephin and azithromycin. Continue to monitor #2 end-stage renal disease on hemodialysis-nephrology has been  consulted. Last dialysis yesterday. Next dialysis on Monday per schedule. #3 hypertension-continue metoprolol #4 anemia of chronic disease-stable for now. If needed Procrit with dialysis. #5 DVT prophylaxis-subcutaneous heparin    All the records are reviewed and case discussed with ED provider. Management plans discussed with the patient, family and they are in agreement.  CODE STATUS: Full code  TOTAL TIME TAKING CARE OF THIS PATIENT: 50 minutes.    Gladstone Lighter M.D on 04/16/2015 at 2:18 PM  Between 7am to 6pm - Pager - [phone]  After 6pm go to [email or web address] - password [email or web address] [facility]  [facility]  Office  [phone]  CC: Primary care physician; No PCP Per Patient

## 2015-04-16 NOTE — ED Notes (Signed)
MD at bedside. 

## 2015-04-17 LAB — BASIC METABOLIC PANEL
ANION GAP: 12 (ref 5–15)
BUN: 49 mg/dL — ABNORMAL HIGH (ref 6–20)
CO2: 32 mmol/L (ref 22–32)
Calcium: 8.8 mg/dL — ABNORMAL LOW (ref 8.9–10.3)
Chloride: 95 mmol/L — ABNORMAL LOW (ref 101–111)
Creatinine, Ser: 13.96 mg/dL — ABNORMAL HIGH (ref 0.61–1.24)
GFR, EST AFRICAN AMERICAN: 4 mL/min — AB (ref >60)
GFR, EST NON AFRICAN AMERICAN: 4 mL/min — AB (ref >60)
Glucose, Bld: 103 mg/dL — ABNORMAL HIGH (ref 65–99)
POTASSIUM: 4.6 mmol/L (ref 3.5–5.1)
SODIUM: 139 mmol/L (ref 135–145)

## 2015-04-17 LAB — CBC
HEMATOCRIT: 30.6 % — AB (ref 40.0–52.0)
Hemoglobin: 9.8 g/dL — ABNORMAL LOW (ref 13.0–18.0)
MCH: 30.9 pg (ref 26.0–34.0)
MCHC: 32.1 g/dL (ref 32.0–36.0)
MCV: 96.2 fL (ref 80.0–100.0)
Platelets: 105 10*3/uL — ABNORMAL LOW (ref 150–440)
RBC: 3.18 MIL/uL — AB (ref 4.40–5.90)
RDW: 15.8 % — AB (ref 11.5–14.5)
WBC: 9.7 10*3/uL (ref 3.8–10.6)

## 2015-04-17 MED ORDER — OXYCODONE-ACETAMINOPHEN 5-325 MG PO TABS
1.0000 | ORAL_TABLET | Freq: Four times a day (QID) | ORAL | Status: DC | PRN
  Administered 2015-04-17 – 2015-04-18 (×2): 1 via ORAL
  Filled 2015-04-17 (×2): qty 1

## 2015-04-17 MED ORDER — EPOETIN ALFA 10000 UNIT/ML IJ SOLN: 10000.0000 [IU] | Freq: Once | INTRAMUSCULAR | Status: DC

## 2015-04-17 MED ORDER — SODIUM CHLORIDE 0.9 % IV SOLN: 100.0000 mL | INTRAVENOUS | Status: DC | PRN

## 2015-04-17 MED ORDER — SEVELAMER CARBONATE 800 MG PO TABS: 2400.0000 mg | ORAL_TABLET | Freq: Three times a day (TID) | ORAL | Status: DC

## 2015-04-17 NOTE — Progress Notes (Signed)
Patient states that he does not wish to take dialysis here at hospital, states that when the staff sticks him it hurts and he rather go to his normal HD facility.  Dr. Volanda Napoleon and Dr. Juleen China rounded and discussed with patient importance of him staying today to continue antibiotics.  IV access lost and will reattempt to ensure that patient gets his antibiotic.    Hourly rounding and patient was not found in room.  Searched entire floor and IC, medical mall and visitors entrance and did not locate patient.  Called Code walker and found patient sitting downstairs in cafeteria.  Patient was explained that he is able to leave unit if he signs out and we know where he is.

## 2015-04-17 NOTE — Progress Notes (Signed)
Patient having abdominal discomfort and vomiting at the beginning of the shift. After receiving zofran the pain and discomfort subsided. Patient remained pain free until early morning when he received some tylenol for pain with relief.

## 2015-04-17 NOTE — Progress Notes (Signed)
[facility] [place] NAME: [name] [name]    MR#:  [id number]  DATE OF BIRTH:  March 01, 1961  SUBJECTIVE:  CHIEF COMPLAINT:   Chief Complaint  Patient presents with  . Code Sepsis    cough, congestion, nausea and fever since yesterday after leaving dialysis   Feeling weak. Poor PO intake.   Hopes for discharge before HD tomorrow.  REVIEW OF SYSTEMS:  Review of Systems  Constitutional: Positive for fever, chills and malaise/fatigue.  Eyes: Negative for blurred vision and pain.  Respiratory: Positive for cough. Negative for sputum production and shortness of breath.   Cardiovascular: Negative for chest pain and palpitations.  Gastrointestinal: Positive for abdominal pain. Negative for heartburn, vomiting, diarrhea and constipation.  Genitourinary: Negative for dysuria, urgency and frequency.  Skin: Negative for itching and rash.  Neurological: Negative for dizziness and headaches.     DRUG ALLERGIES:   Allergies  Allergen Reactions  . Tape Other (See Comments)    Plastic Tape Causes blister    VITALS:  Blood pressure 139/85, pulse 128, temperature 98.6 F (37 C), temperature source Oral, resp. rate 16, height 6' (1.829 m), weight 99.791 kg (220 lb), SpO2 94 %.  PHYSICAL EXAMINATION:  Physical Exam  Constitutional: He is oriented to person, place, and time. He appears well-developed and well-nourished.  HENT:  Head: Normocephalic and atraumatic.  Eyes: EOM are normal. Pupils are equal, round, and reactive to light.  Neck: Normal range of motion.  Cardiovascular: Normal rate and normal heart sounds.   Pulmonary/Chest: Effort normal. No respiratory distress. He has rales.  Abdominal: Soft. Bowel sounds are normal. There is tenderness.  Lower abdominal tenderness  Musculoskeletal: Normal range of motion. He exhibits no edema or tenderness.  Neurological: He is oriented to person, place, and time.  Skin: Skin is warm and dry.      LABORATORY PANEL:   CBC  Recent Labs Lab 04/17/15 0451  WBC 9.7  HGB 9.8*  HCT 30.6*  PLT 105*   ------------------------------------------------------------------------------------------------------------------  Chemistries   Recent Labs Lab 04/16/15 1115 04/17/15 0451  NA 137 139  K 4.3 4.6  CL 94* 95*  CO2 30 32  GLUCOSE 113* 103*  BUN 37* 49*  CREATININE 11.66* 13.96*  CALCIUM 9.1 8.8*  AST 16  --   ALT 10*  --   ALKPHOS 51  --   BILITOT 0.7  --    ------------------------------------------------------------------------------------------------------------------  Cardiac Enzymes No results for input(s): TROPONINI in the last 168 hours. ------------------------------------------------------------------------------------------------------------------  RADIOLOGY:  Dg Chest 2 View  04/16/2015   CLINICAL DATA:  54 year old male with fever, weakness, shortness of breath and nausea beginning yesterday after leaving dialysis. Medical history includes a previous loculated right pleural effusion which was treated with video-assisted thoracoscopic surgery and decortication. 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  EXAM: CHEST  2 VIEW  COMPARISON:  Prior chest x-ray 08/18/2013  FINDINGS: Stable mild cardiomegaly. Atherosclerotic calcifications present in the transverse aorta. The appearance of the right lung is similar compared to prior with chronic pleural thickening at the lung apex and at the lung base. There is no evidence of air in the right apex as seen on previous imaging suggesting resolution of the previously described bronchopleural fistula. There may be some minimal patchy airspace opacity in the left base on the frontal view. Mild pulmonary vascular congestion  without overt edema similar compared to prior. No  acute osseous abnormality.  IMPRESSION: 1. Perhaps mild patchy airspace opacity in the left base on the frontal view which may reflect atelectasis, residual scarring from prior disease, or early pneumonia. 2. Unchanged appearance of the right chest with residual pleural thickening versus loculated pleural effusion. No evidence of hydro pneumothorax to suggest recurrent bronchopleural fistula.   Electronically Signed   By: Jacqulynn Cadet M.D.   On: 04/16/2015 11:57    EKG:   Orders placed or performed during the hospital encounter of 04/16/15  . ED EKG  . ED EKG  . EKG 12-Lead  . EKG 12-Lead    ASSESSMENT AND PLAN:    Expand All Collapse All   [facility] [place] NAME: [name] [name]   MR#: [id number]  DATE OF BIRTH: 07-Nov-1961  DATE OF ADMISSION: 04/16/2015  PRIMARY CARE PHYSICIAN: Dr. Juleen China  REQUESTING/REFERRING PHYSICIAN: Dr. Corky Downs  CHIEF COMPLAINT:   Chief Complaint  Patient presents with  . Code Sepsis    cough, congestion, nausea and fever since yesterday after leaving dialysis    HISTORY OF PRESENT ILLNESS:  [name] is a 54 y.o. male with a known history of end-stage renal disease on Monday Wednesday Friday hemodialysis, hypertension, anemia of chronic disease, tobacco use disorder, comes from home secondary to worsening fever and chills that started since yesterday. Had dialysis yesterday and as he was about to come off of dialysis he started to have chills and fevers, had cough congestion and not prevent home to see if he would get any better. Overnight he couldn't get comfortable he was Having chills, generalized abdominal pain and some diarrhea. Nauseous and throwing up yesterday but no nausea or vomiting or diarrhea today. In the hospital he is noted to have a fever of 101F, chest x-ray did show possible consolidation in left lower lobe and he is being admitted for pneumonia.  PAST MEDICAL  HISTORY:   Past Medical History  Diagnosis Date  . Secondary hyperparathyroidism   . Hypertension   . Polysubstance abuse   . Anemia of chronic disease     BL Hgb ~10  . Tobacco abuse   . CHF (congestive heart failure)     pt denies 04/01/14  . Pneumonia     hx of  . End-stage renal disease on hemodialysis     HD m/w/f - right arm graft  . History of blood transfusion   . Pneumonia   . Sleep disturbance     PAST SURGICAL HISTORY:   Past Surgical History  Procedure Laterality Date  . Surgery for horseshoe kidney    . Colonoscopy  12/16/2012    Procedure: COLONOSCOPY; Surgeon: Milus Banister, MD; Location: [place]; Service: Endoscopy; Laterality: N/A;  . Video assisted thoracoscopy  12/18/2012    Procedure: VIDEO ASSISTED THORACOSCOPY; Surgeon: Melrose Nakayama, MD; Location: [place]; Service: Thoracic; Laterality: Right; pleural peel  . Pleural effusion drainage  12/18/2012    Procedure: DRAINAGE OF PLEURAL EFFUSION; Surgeon: Melrose Nakayama, MD; Location: [place]; Service: Thoracic; Laterality: Right;  . Video bronchoscopy with insertion of interbronchial valve (ibv)  01/09/2013    Procedure: VIDEO BRONCHOSCOPY WITH INSERTION OF INTERBRONCHIAL VALVE (IBV); Surgeon: Melrose Nakayama, MD; Location: [place]; Service: Thoracic; Laterality: N/A; placement of 2 Intrabronchial valves in right lower lobe  . Tee without cardioversion N/A 01/16/2013    Procedure: TRANSESOPHAGEAL ECHOCARDIOGRAM (TEE); Surgeon: Sanda Klein, MD;  Location: [facility]; Service: Cardiovascular; Laterality: N/A;  . Talc pleurodesis Right 02/05/2013    Procedure: Pietro Cassis; Surgeon: Melrose Nakayama, MD; Location: [facility]; Service: Thoracic; Laterality: Right;  . Video bronchoscopy with insertion of interbronchial valve (ibv) Right 02/19/2013    Procedure: VIDEO  BRONCHOSCOPY WITH INSERTION OF INTERBRONCHIAL VALVE (IBV); Surgeon: Melrose Nakayama, MD; Location: [place]; Service: Thoracic; Laterality: Right; removal of two interbronchial valves right chest  . Video assisted thoracoscopy (vats)/thorocotomy Right 02/19/2013    Procedure: VIDEO ASSISTED THORACOSCOPY (VATS)/THOROCOTOMY; Surgeon: Melrose Nakayama, MD; Location: [place]; Service: Thoracic; Laterality: Right; Right Video Assisted Thoracoscopy, Right Thoracotomy, Decortication,   . Chest tube insertion  02/2013  . Appendectomy      SOCIAL HISTORY:   History  Substance Use Topics  . Smoking status: Current Every Day Smoker -- 0.00 packs/day for 20 years  . Smokeless tobacco: Current User  . Alcohol Use: No     Comment: occasional   Smokes 1 1/2 Packs every day  FAMILY HISTORY:   Family History  Problem Relation Age of Onset  . Thyroid disease Mother   . Renal Disease Maternal Grandmother   . Diabetes Maternal Grandmother   . Hypertension Maternal Aunt   . Hypertension Maternal Uncle     DRUG ALLERGIES:   Allergies  Allergen Reactions  . Tape Other (See Comments)    Plastic Tape Causes blister    REVIEW OF SYSTEMS:  CONSTITUTIONAL: Positive for fever, fatigue or weakness.  EYES: No blurred or double vision.  EARS, NOSE, AND THROAT: No tinnitus or ear pain.  RESPIRATORY: No cough, shortness of breath, wheezing or hemoptysis.  CARDIOVASCULAR: No chest pain, orthopnea, edema.  GASTROINTESTINAL: Positive for nausea, vomiting, No diarrhea , positive for abdominal pain.  GENITOURINARY: Oliguric, No dysuria, hematuria.  ENDOCRINE: No polyuria, nocturia,  HEMATOLOGY: No anemia, easy bruising or bleeding SKIN: No rash or lesion. MUSCULOSKELETAL: No joint pain or arthritis.  NEUROLOGIC: No tingling, numbness, weakness.  PSYCHIATRY: No anxiety or depression.   MEDICATIONS  AT HOME:   Prior to Admission medications   Medication Sig Start Date End Date Taking? Authorizing Provider  calcium acetate (PHOSLO) 667 MG capsule Take 2,668 mg by mouth 3 (three) times daily with meals.    Yes Historical Provider, MD  cinacalcet (SENSIPAR) 90 MG tablet Take 90 mg by mouth daily.   Yes Historical Provider, MD  metoprolol succinate (TOPROL-XL) 50 MG 24 hr tablet Take 50 mg by mouth daily. Take with or immediately following a meal.   Yes Historical Provider, MD  zolpidem (AMBIEN CR) 6.25 MG CR tablet Take 6.25 mg by mouth at bedtime as needed for sleep.   Yes Historical Provider, MD     VITAL SIGNS:  Blood pressure 144/86, pulse 105, temperature 101.4 F (38.6 C), temperature source Oral, resp. rate 26, height 6' (1.829 m), weight 99.791 kg (220 lb), SpO2 91 %.  PHYSICAL EXAMINATION:  GENERAL: 54 y.o.-year-old patient lying in the bed with no acute distress.  EYES: Pupils equal, round, reactive to light and accommodation. No scleral icterus. Extraocular muscles intact.  HEENT: Head atraumatic, normocephalic. Oropharynx and nasopharynx clear.  NECK: Supple, no jugular venous distention. No thyroid enlargement, no tenderness.  LUNGS: Normal breath sounds bilaterally, no wheezing, rales,rhonchi or crepitation. No use of accessory muscles of respiration.  CARDIOVASCULAR: S1, S2 normal. No murmurs, rubs, or gallops.  ABDOMEN: Soft, nontender, nondistended. Bowel sounds present. No organomegaly or mass.  EXTREMITIES: No pedal edema, cyanosis, or clubbing.  Right arm graft with good pulse present NEUROLOGIC: Cranial nerves II through XII are intact. Muscle strength 5/5 in all extremities. Sensation intact. Gait not checked.  PSYCHIATRIC: The patient is alert and oriented x 3.  SKIN: No obvious rash, lesion, or ulcer.   LABORATORY PANEL:   CBC  Last Labs      Recent Labs Lab 04/16/15 1115  WBC 9.0  HGB 10.7*   HCT 34.2*  PLT 128*     ------------------------------------------------------------------------------------------------------------------  Chemistries   Last Labs      Recent Labs Lab 04/16/15 1115  NA 137  K 4.3  CL 94*  CO2 30  GLUCOSE 113*  BUN 37*  CREATININE 11.66*  CALCIUM 9.1  AST 16  ALT 10*  ALKPHOS 51  BILITOT 0.7     ------------------------------------------------------------------------------------------------------------------  Cardiac Enzymes  Last Labs     No results for input(s): TROPONINI in the last 168 hours.   ------------------------------------------------------------------------------------------------------------------  RADIOLOGY:   Imaging Results (Last 48 hours)    Dg Chest 2 View  04/16/2015 CLINICAL DATA: 54 year old male with fever, weakness, shortness of breath and nausea beginning yesterday after leaving dialysis. Medical history includes a previous loculated right pleural effusion which was treated with video-assisted thoracoscopic surgery and decortication. 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 EXAM: CHEST 2 VIEW COMPARISON: Prior chest x-ray 08/18/2013 FINDINGS: Stable mild cardiomegaly. Atherosclerotic calcifications present in the transverse aorta. The appearance of the right lung is similar compared to prior with chronic pleural thickening at the lung apex and at the lung base. There is no evidence of air in the right apex as seen on previous imaging suggesting resolution of the previously described bronchopleural fistula. There may be some minimal patchy airspace opacity in the left base on the frontal view. Mild pulmonary vascular congestion without overt edema similar compared to prior. No acute osseous abnormality. IMPRESSION: 1. Perhaps  mild patchy airspace opacity in the left base on the frontal view which may reflect atelectasis, residual scarring from prior disease, or early pneumonia. 2. Unchanged appearance of the right chest with residual pleural thickening versus loculated pleural effusion. No evidence of hydro pneumothorax to suggest recurrent bronchopleural fistula. Electronically Signed By: Jacqulynn Cadet M.D. On: 04/16/2015 11:57     EKG:   Orders placed or performed during the hospital encounter of 04/16/15  . ED EKG  . ED EKG    IMPRESSION AND PLAN:   54 year old African-American male with past medical history significant for hypertension, end-stage renal disease on Monday Wednesday Friday hemodialysis, congestive heart failure, anemia of chronic disease presents to the hospital secondary to worsening fever chills and cough congestion.   #1 sepsis-likely source pneumonia.  - Cultures pending - continue Rocephin and azithromycin. - Continue to monitor  #2 end-stage renal disease on hemodialysis- - nephrology has been consulted. Last dialysis yesterday. Next dialysis on Monday per schedule.   #3 hypertension-continue metoprolol  #4 anemia of chronic disease-stable for now. If needed Procrit with dialysis.  #5 DVT prophylaxis-subcutaneous heparin          All the records are reviewed and case discussed with Care Management/Social Workerr. Management plans discussed with the patient, family and they are in agreement.  CODE STATUS: full  TOTAL TIME TAKING CARE OF THIS PATIENT: 30 minutes.   POSSIBLE D/C IN 1 DAYS, DEPENDING ON CLINICAL CONDITION.   Myrtis Ser M.D on 04/17/2015 at  2:09 PM  Between 7am to 6pm - Pager - [phone]  After 6pm go to [email or web address] - password [email or web address] [facility]  [facility]  Office  [phone]  CC: Primary care physician; No PCP Per Patient

## 2015-04-17 NOTE — Progress Notes (Signed)
[facility]  ROUNDING NOTE   Subjective:   Admitted on 04/16/2015 for left lower lobe pneumonia with fevers, chills, nausea, vomiting, cough with productive sputum.  Last hemodialysis on Friday.  Currently unable to take his sensipar.   Objective:  Vital signs in last 24 hours:  Temp:  [98.6 F (37 C)-99.9 F (37.7 C)] 98.6 F (37 C) (05/08 0340) Pulse Rate:  [83-106] 83 (05/08 0340) Resp:  [16-24] 16 (05/08 0340) BP: (137-154)/(79-93) 139/85 mmHg (05/08 0340) SpO2:  [92 %-97 %] 94 % (05/08 0340) FiO2 (%):  [21 %] 21 % (05/07 1542)  Weight change:  Filed Weights   04/16/15 1103  Weight: 99.791 kg (220 lb)    Intake/Output: I/O last 3 completed shifts: In: 120 [P.O.:120] Out: -    Intake/Output this shift:     Physical Exam: General: Sick appearing  Head: Normocephalic, atraumatic. Moist oral mucosal membranes  Eyes: Anicteric, PERRL  Neck: Supple, trachea midline  Lungs:  Bibasilar crackles.   Heart: Regular rate and rhythm  Abdomen:  Soft, nontender,   Extremities:  1+ peripheral edema.  Neurologic: Nonfocal, moving all four extremities  Skin: No lesions  Access: Right arm AVG    Basic Metabolic Panel:  Recent Labs Lab 04/16/15 1115 04/17/15 0451  NA 137 139  K 4.3 4.6  CL 94* 95*  CO2 30 32  GLUCOSE 113* 103*  BUN 37* 49*  CREATININE 11.66* 13.96*  CALCIUM 9.1 8.8*    Liver Function Tests:  Recent Labs Lab 04/16/15 1115  AST 16  ALT 10*  ALKPHOS 51  BILITOT 0.7  PROT 8.3*  ALBUMIN 3.9   No results for input(s): LIPASE, AMYLASE in the last 168 hours. No results for input(s): AMMONIA in the last 168 hours.  CBC:  Recent Labs Lab 04/16/15 1115 04/17/15 0451  WBC 9.0 9.7  NEUTROABS 7.0*  --   HGB 10.7* 9.8*  HCT 34.2* 30.6*  MCV 96.2 96.2  PLT 128* 105*    Cardiac Enzymes: No results for input(s): CKTOTAL, CKMB, CKMBINDEX, TROPONINI in the last 168 hours.  BNP: Invalid input(s): POCBNP  CBG: No results for  input(s): GLUCAP in the last 168 hours.  Microbiology: Results for orders placed or performed during the hospital encounter of 04/16/15  Blood Culture (routine x 2)     Status: None (Preliminary result)   Collection Time: 04/16/15 11:15 AM  Result Value Ref Range Status   Specimen Description BLOOD  Final   Special Requests NONE  Final   Culture NO GROWTH < 24 HOURS  Final   Report Status PENDING  Incomplete  Blood Culture (routine x 2)     Status: None (Preliminary result)   Collection Time: 04/16/15 11:25 AM  Result Value Ref Range Status   Specimen Description BLOOD  Final   Special Requests NONE  Final   Culture NO GROWTH < 24 HOURS  Final   Report Status PENDING  Incomplete    Coagulation Studies: No results for input(s): LABPROT, INR in the last 72 hours.  Urinalysis: No results for input(s): COLORURINE, LABSPEC, PHURINE, GLUCOSEU, HGBUR, BILIRUBINUR, KETONESUR, PROTEINUR, UROBILINOGEN, NITRITE, LEUKOCYTESUR in the last 72 hours.  Invalid input(s): APPERANCEUR    Imaging: Dg Chest 2 View  04/16/2015   CLINICAL DATA:  54 year old male with fever, weakness, shortness of breath and nausea beginning yesterday after leaving dialysis. Medical history includes a previous loculated right pleural effusion which was treated with video-assisted thoracoscopic surgery and decortication. This procedure was complicated  by persistent air leak and bronchopleural fistula requiring several procedures including repeat thoracoscopic surgery and suture repair as well as talc pleurodesis.  EXAM: CHEST  2 VIEW  COMPARISON:  Prior chest x-ray 08/18/2013  FINDINGS: Stable mild cardiomegaly. Atherosclerotic calcifications present in the transverse aorta. The appearance of the right lung is similar compared to prior with chronic pleural thickening at the lung apex and at the lung base. There is no evidence of air in the right apex as seen on previous imaging suggesting resolution of the previously described  bronchopleural fistula. There may be some minimal patchy airspace opacity in the left base on the frontal view. Mild pulmonary vascular congestion without overt edema similar compared to prior. No acute osseous abnormality.  IMPRESSION: 1. Perhaps mild patchy airspace opacity in the left base on the frontal view which may reflect atelectasis, residual scarring from prior disease, or early pneumonia. 2. Unchanged appearance of the right chest with residual pleural thickening versus loculated pleural effusion. No evidence of hydro pneumothorax to suggest recurrent bronchopleural fistula.   Electronically Signed   By: Jacqulynn Cadet M.D.   On: 04/16/2015 11:57     Medications:     . azithromycin  500 mg Intravenous Q24H  . calcium acetate  2,668 mg Oral TID WC  . cefTRIAXone (ROCEPHIN)  IV  1 g Intravenous Q24H  . cinacalcet  90 mg Oral Daily  . heparin  5,000 Units Subcutaneous 3 times per day  . metoprolol succinate  50 mg Oral Daily   acetaminophen, ondansetron (ZOFRAN) IV, zolpidem  Assessment/ Plan:  54 y.o.black male with ESRD, hypertension, SHPTH, anemia, right sided pleural effusion s/p talc pleurodesis, right chest tube placement, right chest decortication 5/14   Admitted on 04/16/2015 for Sepsis, pneumonia  [facility] MWF [place].   1. End Stage Renal Disease: MWF. Last hemodialysis on Friday. Potassium of 4.6 - plan for hemodialysis on Monday, tomorrow. Orders prepared.   2. Hypertension: well controlled currently.  - continue metoprolol.  - home regimen includes hydralazine, amlodipine, and furosemide as well.   3. Anemia of chronic kidney disease: hemoglobin of 9.8 - epo with hemodialysis treatment.  4. Secondary Hyperparathyroidism: states he is unable to take sensipar due to nausea and vomiting.  - PTH 314 and phos 10.4 as outpatient.  - continue calcium acetate and sevelamer with meals.   5. Pneumonia: azithromycin and ceftriaxone.     LOS: [place],  Jesseka Drinkard 5/8/201612:23 PM

## 2015-04-18 DIAGNOSIS — J189 Pneumonia, unspecified organism: Secondary | ICD-10-CM

## 2015-04-18 LAB — RENAL FUNCTION PANEL
Albumin: 3.3 g/dL — ABNORMAL LOW (ref 3.5–5.0)
Anion gap: 14 (ref 5–15)
BUN: 64 mg/dL — ABNORMAL HIGH (ref 6–20)
CO2: 30 mmol/L (ref 22–32)
Calcium: 9.1 mg/dL (ref 8.9–10.3)
Chloride: 97 mmol/L — ABNORMAL LOW (ref 101–111)
Creatinine, Ser: 17.5 mg/dL — ABNORMAL HIGH (ref 0.61–1.24)
GFR calc Af Amer: 3 mL/min — ABNORMAL LOW
GFR calc non Af Amer: 3 mL/min — ABNORMAL LOW
Glucose, Bld: 143 mg/dL — ABNORMAL HIGH (ref 65–99)
Phosphorus: 8.2 mg/dL — ABNORMAL HIGH (ref 2.5–4.6)
Potassium: 4.2 mmol/L (ref 3.5–5.1)
Sodium: 141 mmol/L (ref 135–145)

## 2015-04-18 LAB — CBC
HCT: 30.1 % — ABNORMAL LOW (ref 40.0–52.0)
HEMATOCRIT: 31.8 % — AB (ref 40.0–52.0)
HEMOGLOBIN: 9.9 g/dL — AB (ref 13.0–18.0)
Hemoglobin: 9.4 g/dL — ABNORMAL LOW (ref 13.0–18.0)
MCH: 30 pg (ref 26.0–34.0)
MCH: 30.1 pg (ref 26.0–34.0)
MCHC: 31.1 g/dL — ABNORMAL LOW (ref 32.0–36.0)
MCHC: 31.4 g/dL — ABNORMAL LOW (ref 32.0–36.0)
MCV: 96.4 fL (ref 80.0–100.0)
MCV: 96 fL (ref 80.0–100.0)
PLATELETS: 106 10*3/uL — AB (ref 150–440)
Platelets: 114 10*3/uL — ABNORMAL LOW (ref 150–440)
RBC: 3.29 MIL/uL — ABNORMAL LOW (ref 4.40–5.90)
RBC: 3.14 MIL/uL — ABNORMAL LOW (ref 4.40–5.90)
RDW: 15.5 % — ABNORMAL HIGH (ref 11.5–14.5)
RDW: 15.6 % — AB (ref 11.5–14.5)
WBC: 7.4 10*3/uL (ref 3.8–10.6)
WBC: 6.6 10*3/uL (ref 3.8–10.6)

## 2015-04-18 LAB — BASIC METABOLIC PANEL
Anion gap: 15 (ref 5–15)
BUN: 65 mg/dL — ABNORMAL HIGH (ref 6–20)
CALCIUM: 9.1 mg/dL (ref 8.9–10.3)
CO2: 29 mmol/L (ref 22–32)
Chloride: 96 mmol/L — ABNORMAL LOW (ref 101–111)
Creatinine, Ser: 17.42 mg/dL — ABNORMAL HIGH (ref 0.61–1.24)
GFR calc Af Amer: 3 mL/min — ABNORMAL LOW (ref >60)
GFR, EST NON AFRICAN AMERICAN: 3 mL/min — AB (ref >60)
GLUCOSE: 103 mg/dL — AB (ref 65–99)
Potassium: 4.6 mmol/L (ref 3.5–5.1)
Sodium: 140 mmol/L (ref 135–145)

## 2015-04-18 MED ORDER — LEVOFLOXACIN 250 MG PO TABS: 250.0000 mg | ORAL_TABLET | ORAL | Status: DC

## 2015-04-18 NOTE — Progress Notes (Signed)
Patient alert & oriented and appropriate.  Completed discharge instructions with patient.  IV discontinued.  Patient prescription given to patient.  Patient wanted to walk to visitors entrance and wait on his family to come pick him up.  Patient has been ambulatory and independent.  No concern for patient to sit at visitor center.  Gave patient unit phone number in case he has any issues while waiting on his ride.

## 2015-04-18 NOTE — Progress Notes (Signed)
Patient does not make urine therefore unable to complete urinalysis labs.

## 2015-04-18 NOTE — Discharge Instructions (Signed)
°  DIET:  Renal diet  DISCHARGE CONDITION:  Stable  ACTIVITY:  Activity as tolerated  OXYGEN:  Home Oxygen: No.   Oxygen Delivery: room air  DISCHARGE LOCATION:  Home with plan for dialysis for today   If you experience worsening of your admission symptoms, develop shortness of breath, life threatening emergency, suicidal or homicidal thoughts you must seek medical attention immediately by calling 911 or calling your MD immediately  if symptoms less severe.  You Must read complete instructions/literature along with all the possible adverse reactions/side effects for all the Medicines you take and that have been prescribed to you. Take any new Medicines after you have completely understood and accpet all the possible adverse reactions/side effects.   Please note  You were cared for by a hospitalist during your hospital stay. If you have any questions about your discharge medications or the care you received while you were in the hospital after you are discharged, you can call the unit and asked to speak with the hospitalist on call if the hospitalist that took care of you is not available. Once you are discharged, your primary care physician will handle any further medical issues. Please note that NO REFILLS for any discharge medications will be authorized once you are discharged, as it is imperative that you return to your primary care physician (or establish a relationship with a primary care physician if you do not have one) for your aftercare needs so that they can reassess your need for medications and monitor your lab values.

## 2015-04-18 NOTE — Progress Notes (Signed)
Resting well this shift . No complaints . Refuses dialysis at hospital would like to go to HD facility after d/c

## 2015-04-19 LAB — LACTIC ACID, PLASMA: LACTIC ACID, VENOUS: 2.1 mmol/L — AB (ref 0.5–2.0)

## 2015-04-21 LAB — CULTURE, BLOOD (ROUTINE X 2)
CULTURE: NO GROWTH
Culture: NO GROWTH

## 2015-04-22 ENCOUNTER — Emergency Department
Admission: EM | Admit: 2015-04-22 | Discharge: 2015-04-23 | Disposition: A | Discharge status: Home / Self Care | Payer: Medicare Other | Source: Home / Self Care | Attending: Emergency Medicine | Admitting: Emergency Medicine

## 2015-04-22 ENCOUNTER — Encounter: Payer: Self-pay | Admitting: Emergency Medicine

## 2015-04-22 ENCOUNTER — Emergency Department: Payer: Medicare Other

## 2015-04-22 ENCOUNTER — Other Ambulatory Visit: Payer: Self-pay

## 2015-04-22 DIAGNOSIS — I120 Hypertensive chronic kidney disease with stage 5 chronic kidney disease or end stage renal disease: Secondary | ICD-10-CM

## 2015-04-22 DIAGNOSIS — Z992 Dependence on renal dialysis: Secondary | ICD-10-CM

## 2015-04-22 DIAGNOSIS — Z720 Tobacco use: Secondary | ICD-10-CM | POA: Insufficient documentation

## 2015-04-22 DIAGNOSIS — K5732 Diverticulitis of large intestine without perforation or abscess without bleeding: Secondary | ICD-10-CM | POA: Diagnosis not present

## 2015-04-22 DIAGNOSIS — N186 End stage renal disease: Secondary | ICD-10-CM

## 2015-04-22 DIAGNOSIS — K5792 Diverticulitis of intestine, part unspecified, without perforation or abscess without bleeding: Secondary | ICD-10-CM | POA: Insufficient documentation

## 2015-04-22 DIAGNOSIS — R1030 Lower abdominal pain, unspecified: Secondary | ICD-10-CM

## 2015-04-22 DIAGNOSIS — Z79899 Other long term (current) drug therapy: Secondary | ICD-10-CM | POA: Insufficient documentation

## 2015-04-22 HISTORY — DX: Disorder of kidney and ureter, unspecified: N28.9

## 2015-04-22 LAB — CBC WITH DIFFERENTIAL/PLATELET
BASOS ABS: 0.1 10*3/uL (ref 0–0.1)
Basophils Relative: 2 %
Eosinophils Absolute: 0 10*3/uL (ref 0–0.7)
Eosinophils Relative: 1 %
HCT: 36.4 % — ABNORMAL LOW (ref 40.0–52.0)
Hemoglobin: 11.3 g/dL — ABNORMAL LOW (ref 13.0–18.0)
LYMPHS ABS: 0.9 10*3/uL — AB (ref 1.0–3.6)
LYMPHS PCT: 15 %
MCH: 29.6 pg (ref 26.0–34.0)
MCHC: 31 g/dL — ABNORMAL LOW (ref 32.0–36.0)
MCV: 95.5 fL (ref 80.0–100.0)
MONO ABS: 0.5 10*3/uL (ref 0.2–1.0)
Monocytes Relative: 9 %
Neutro Abs: 4.3 10*3/uL (ref 1.4–6.5)
Neutrophils Relative %: 73 %
PLATELETS: 175 10*3/uL (ref 150–440)
RBC: 3.81 MIL/uL — ABNORMAL LOW (ref 4.40–5.90)
RDW: 16.2 % — ABNORMAL HIGH (ref 11.5–14.5)
WBC: 5.8 10*3/uL (ref 3.8–10.6)

## 2015-04-22 LAB — LIPASE, BLOOD: LIPASE: 65 U/L — AB (ref 22–51)

## 2015-04-22 LAB — COMPREHENSIVE METABOLIC PANEL
ALBUMIN: 3.9 g/dL (ref 3.5–5.0)
ALT: 16 U/L — AB (ref 17–63)
AST: 20 U/L (ref 15–41)
Alkaline Phosphatase: 56 U/L (ref 38–126)
Anion gap: 13 (ref 5–15)
BILIRUBIN TOTAL: 0.4 mg/dL (ref 0.3–1.2)
BUN: 22 mg/dL — ABNORMAL HIGH (ref 6–20)
CO2: 32 mmol/L (ref 22–32)
Calcium: 9.5 mg/dL (ref 8.9–10.3)
Chloride: 94 mmol/L — ABNORMAL LOW (ref 101–111)
Creatinine, Ser: 9.28 mg/dL — ABNORMAL HIGH (ref 0.61–1.24)
GFR calc Af Amer: 7 mL/min — ABNORMAL LOW (ref >60)
GFR, EST NON AFRICAN AMERICAN: 6 mL/min — AB (ref >60)
GLUCOSE: 107 mg/dL — AB (ref 65–99)
Potassium: 4 mmol/L (ref 3.5–5.1)
SODIUM: 139 mmol/L (ref 135–145)
TOTAL PROTEIN: 9 g/dL — AB (ref 6.5–8.1)

## 2015-04-22 LAB — LACTIC ACID, PLASMA: Lactic Acid, Venous: 0.9 mmol/L (ref 0.5–2.0)

## 2015-04-22 MED ORDER — IOHEXOL 240 MG/ML SOLN
25.0000 mL | Freq: Once | INTRAMUSCULAR | Status: AC | PRN
  Administered 2015-04-22: 25 mL via ORAL

## 2015-04-22 MED ORDER — ONDANSETRON HCL 4 MG/2ML IJ SOLN
4.0000 mg | INTRAMUSCULAR | Status: AC
  Administered 2015-04-22: 4 mg via INTRAVENOUS

## 2015-04-22 MED ORDER — HYDROMORPHONE HCL 1 MG/ML IJ SOLN
INTRAMUSCULAR | Status: AC
  Administered 2015-04-22: 1 mg via INTRAVENOUS
  Filled 2015-04-22: qty 1

## 2015-04-22 MED ORDER — IOHEXOL 300 MG/ML  SOLN
100.0000 mL | Freq: Once | INTRAMUSCULAR | Status: AC | PRN
  Administered 2015-04-22: 100 mL via INTRAVENOUS

## 2015-04-22 MED ORDER — ONDANSETRON HCL 4 MG/2ML IJ SOLN
INTRAMUSCULAR | Status: AC
  Administered 2015-04-22: 4 mg via INTRAVENOUS
  Filled 2015-04-22: qty 2

## 2015-04-22 MED ORDER — HYDROMORPHONE HCL 1 MG/ML IJ SOLN
1.0000 mg | INTRAMUSCULAR | Status: AC
  Administered 2015-04-22: 1 mg via INTRAVENOUS

## 2015-04-22 NOTE — ED Notes (Signed)
Patient states he makes urine but rarely. Discussed need for urine sample with patient. Patient denies need to void at this time. Patient provided with urine specimen cup. Encouraged patient to notify nurse when able to provide sample. Patient verbalized understanding.

## 2015-04-22 NOTE — ED Notes (Signed)
Patient c/o abdominal pain that started today. Patient is a dialysis patient.  Patient moaning in triage. Asks, "Can you just knock me out if you're not going to take me back... I wish it would stop so I can walk out". Reports some n/v.

## 2015-04-22 NOTE — ED Provider Notes (Signed)
[facility] Emergency Department Provider Note  ____________________________________________  Time seen: Approximately 9:20 PM  I have reviewed the triage vital signs and the nursing notes.   HISTORY  Chief Complaint Abdominal Pain    HPI RAHIL REH is a 54 y.o. male with a history of hypertension and end-stage renal disease on hemodialysis on Mondays, Wednesdays, Fridays who presents with severe abdominal pain that started several hours ago.  He is not sure why it started but it is been constant and severe.He has had some nausea but no vomiting.  He denies chest pain.  He received a full course of hemodialysis today as an outpatient.   Past Medical History  Diagnosis Date  . Secondary hyperparathyroidism   . Hypertension   . Polysubstance abuse   . Anemia of chronic disease     BL Hgb ~10  . Tobacco abuse   . CHF (congestive heart failure)     pt denies 04/01/14  . Pneumonia     hx of  . End-stage renal disease on hemodialysis     HD m/w/f - right arm graft  . History of blood transfusion   . Pneumonia   . Sleep disturbance   . Renal insufficiency     Patient Active Problem List   Diagnosis Date Noted  . Community acquired pneumonia 04/18/2015  . Sepsis 04/16/2015  . S/P thoracotomy 03/04/2013  . 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 Bacteremia 01/06/2013  . Benign neoplasm of colon 12/16/2012  . Diverticulosis of colon (without mention of hemorrhage) 12/16/2012  . ESRD (end stage renal disease) 12/12/2012  . H/O: hypertension 12/12/2012  . Pleural effusion, right 12/12/2012  . Hyperkalemia 12/11/2012  . Acute blood loss anemia 12/11/2012  . Sinus tachycardia, resolved with PRBC resuscitation 12/11/2012  . Lower GI bleed 12/11/2012  . End-stage renal disease on hemodialysis   . Secondary hyperparathyroidism   . Hypertension   . Polysubstance abuse   . Anemia of chronic disease   . Tobacco abuse     Past Surgical  History  Procedure Laterality Date  . Surgery for horseshoe kidney    . Colonoscopy  12/16/2012    Procedure: COLONOSCOPY;  Surgeon: Milus Banister, MD;  Location: [place];  Service: Endoscopy;  Laterality: N/A;  . Video assisted thoracoscopy  12/18/2012    Procedure: VIDEO ASSISTED THORACOSCOPY;  Surgeon: Melrose Nakayama, MD;  Location: [place];  Service: Thoracic;  Laterality: Right;  pleural peel  . Pleural effusion drainage  12/18/2012    Procedure: DRAINAGE OF PLEURAL EFFUSION;  Surgeon: Melrose Nakayama, MD;  Location: [facility];  Service: Thoracic;  Laterality: Right;  . Video bronchoscopy with insertion of interbronchial valve (ibv)  01/09/2013    Procedure: VIDEO BRONCHOSCOPY WITH INSERTION OF INTERBRONCHIAL VALVE (IBV);  Surgeon: Melrose Nakayama, MD;  Location: [place];  Service: Thoracic;  Laterality: N/A;  placement of 2 Intrabronchial valves in right lower lobe  . Tee without cardioversion N/A 01/16/2013    Procedure: TRANSESOPHAGEAL ECHOCARDIOGRAM (TEE);  Surgeon: Sanda Klein, MD;  Location: [place];  Service: Cardiovascular;  Laterality: N/A;  . [place] pleurodesis Right 02/05/2013    Procedure: Pietro Cassis;  Surgeon: Melrose Nakayama, MD;  Location: [place];  Service: Thoracic;  Laterality: Right;  . Video bronchoscopy with insertion of interbronchial valve (ibv) Right 02/19/2013    Procedure: VIDEO BRONCHOSCOPY WITH INSERTION OF INTERBRONCHIAL VALVE (IBV);  Surgeon: Melrose Nakayama, MD;  Location: [place];  Service: Thoracic;  Laterality: Right;  removal of two interbronchial valves right chest  . Video assisted thoracoscopy (vats)/thorocotomy Right 02/19/2013    Procedure: VIDEO ASSISTED THORACOSCOPY (VATS)/THOROCOTOMY;  Surgeon: Melrose Nakayama, MD;  Location: [place];  Service: Thoracic;  Laterality: Right;  Right Video Assisted Thoracoscopy, Right Thoracotomy, Decortication,   . Chest tube insertion  02/2013  . Appendectomy      Current Outpatient Rx   Name  Route  Sig  Dispense  Refill  . acetaminophen (TYLENOL) 500 MG tablet   Oral   Take 1,000 mg by mouth every 8 (eight) hours as needed for moderate pain.         . Bismuth Subsalicylate (KAOPECTATE PO)   Oral   Take 15 mLs by mouth daily as needed (for nausea).         . calcium acetate (PHOSLO) 667 MG capsule   Oral   Take 2,668 mg by mouth 3 (three) times daily with meals.          . cinacalcet (SENSIPAR) 90 MG tablet   Oral   Take 90 mg by mouth daily.         Marland Kitchen levofloxacin (LEVAQUIN) 250 MG tablet   Oral   Take 1 tablet (250 mg total) by mouth every other day. Patient taking differently: Take 250 mg by mouth every Monday, Wednesday, and Friday.    3 tablet   0   . metoprolol succinate (TOPROL-XL) 50 MG 24 hr tablet   Oral   Take 50 mg by mouth daily. Take with or immediately following a meal.         . zolpidem (AMBIEN CR) 6.25 MG CR tablet   Oral   Take 6.25 mg by mouth at bedtime as needed for sleep.         . ciprofloxacin (CIPRO) 500 MG tablet      Take 1 tablet by mouth daily.  On your dialysis days, take the tablet AFTER you complete dialysis.   9 tablet   0   . metroNIDAZOLE (FLAGYL) 500 MG tablet      Take 1 tablet by mouth three times a day.  On your dialysis days, take the tablet AFTER your dialysis.   30 tablet   0   . oxyCODONE-acetaminophen (ROXICET) 5-325 MG per tablet   Oral   Take 1 tablet by mouth every 4 (four) hours as needed for severe pain.   20 tablet   0     Allergies Tape  Family History  Problem Relation Age of Onset  . Thyroid disease Mother   . Renal Disease Maternal Grandmother   . Diabetes Maternal Grandmother   . Hypertension Maternal Aunt   . Hypertension Maternal Uncle     Social History History  Substance Use Topics  . Smoking status: Current Every Day Smoker -- 0.00 packs/day for 20 years  . Smokeless tobacco: Current User  . Alcohol Use: No     Comment: occasional    Review of  Systems Constitutional: No fever/chills Eyes: No visual changes. ENT: No sore throat. Cardiovascular: Denies chest pain. Respiratory: Denies shortness of breath. Gastrointestinal: Severe generalized sharp abdominal pain.  nausea, no vomiting.  No diarrhea.  No constipation. Genitourinary: Does not produce urine Musculoskeletal: Negative for back pain. Skin: Negative for rash. Neurological: Negative for headaches, focal weakness or numbness.  10-point ROS otherwise negative.  ____________________________________________   PHYSICAL EXAM:  VITAL SIGNS: ED Triage Vitals  Enc Vitals Group     BP  04/22/15 1719 151/83 mmHg     Pulse Rate 04/22/15 1719 98     Resp 04/22/15 1719 24     Temp 04/22/15 1719 97.5 F (36.4 C)     Temp Source 04/22/15 1719 Oral     SpO2 04/22/15 1719 100 %     Weight 04/22/15 1719 244 lb 11.4 oz (111 kg)     Height 04/22/15 1719 6' (1.829 m)     Head Cir --      Peak Flow --      Pain Score 04/22/15 1720 10     Pain Loc --      Pain Edu? --      Excl. in [place]? --     Constitutional: Alert and oriented and appears to be in severe pain Eyes: Conjunctivae are normal. PERRL. EOMI. Head: Atraumatic. Nose: No congestion/rhinnorhea. Mouth/Throat: Mucous membranes are moist.  Oropharynx non-erythematous. Neck: No stridor.   Cardiovascular: Borderline tachycardia, regular rhythm. Grossly normal heart sounds.  Good peripheral circulation. Respiratory: Normal respiratory effort.  No retractions. Lungs CTAB. Gastrointestinal: Soft, obese generalized tenderness throughout. No distention. No abdominal bruits. No CVA tenderness. Musculoskeletal: No lower extremity tenderness nor edema.  No joint effusions. Neurologic:  Normal speech and language. No gross focal neurologic deficits are appreciated. Speech is normal. No gait instability. Skin:  Skin is warm, dry and intact. No rash noted. Psychiatric: Mood and affect are normal. Speech and behavior are  normal.  ____________________________________________   LABS (all labs ordered are listed, but only abnormal results are displayed)  Labs Reviewed  COMPREHENSIVE METABOLIC PANEL - Abnormal; Notable for the following:    Chloride 94 (*)    Glucose, Bld 107 (*)    BUN 22 (*)    Creatinine, Ser 9.28 (*)    Total Protein 9.0 (*)    ALT 16 (*)    GFR calc non Af Amer 6 (*)    GFR calc Af Amer 7 (*)    All other components within normal limits  LIPASE, BLOOD - Abnormal; Notable for the following:    Lipase 65 (*)    All other components within normal limits  CBC WITH DIFFERENTIAL/PLATELET - Abnormal; Notable for the following:    RBC 3.81 (*)    Hemoglobin 11.3 (*)    HCT 36.4 (*)    MCHC 31.0 (*)    RDW 16.2 (*)    Lymphs Abs 0.9 (*)    All other components within normal limits  LACTIC ACID, PLASMA   ____________________________________________  EKG  ED ECG REPORT   Date: 04/22/2015  EKG Time: 17:26  Rate: 98  Rhythm: normal sinus rhythm, with occasional PVCs  Axis: Normal  Intervals:none  ST&T Change: Non-specific ST segment / T-wave changes, but no evidence of acute ischemia.  ____________________________________________  RADIOLOGY  Ct Abdomen Pelvis W Contrast  04/22/2015   CLINICAL DATA:  Abdominal pain starting today.  Vomiting.  EXAM: CT ABDOMEN AND PELVIS WITH CONTRAST  TECHNIQUE: Multidetector CT imaging of the abdomen and pelvis was performed using the standard protocol following bolus administration of intravenous contrast.  CONTRAST:  178mL OMNIPAQUE IOHEXOL 300 MG/ML  SOLN  COMPARISON:  04/06/2014  FINDINGS: Dependent atelectasis and scarring in the lung bases similar to prior study.  The liver, spleen, gallbladder, pancreas, adrenal glands, abdominal aorta, inferior vena cava, and retroperitoneal lymph nodes are unremarkable. 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 These appear to be receiving contrast material from the chest. Findings suggest possible SVC obstruction. Filling defect in the inferior vena cava is consistent with mixing of non-opacified blood. This appearance is unchanged since prior study. 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 No hydronephrosis in either kidney. Small nonobstructing stone in the right kidney is unchanged. The stomach, small bowel, and colon are not abnormally distended. No free air or free fluid in the abdomen. Moderate-sized periumbilical hernia containing fat. No infiltration.  Pelvis: The appendix is normal. Diverticulosis of the sigmoid colon with minimal infiltration in the pericolonic fat, new since prior study, suggesting acute diverticulitis. No abscess. Bladder is decompressed. Prostate gland is not enlarged. No free or loculated pelvic fluid collections. No pelvic mass or lymphadenopathy. Mild diffuse sclerosis demonstrated in the bones is nonspecific but could indicate renal osteodystrophy. No destructive bone lesions.  IMPRESSION: 1. Changes of acute diverticulitis.  No abscess. 2. Chronic finding of multiple venous collaterals throughout the lower chest and abdominal wall. This raises suspicion of SVC obstruction. No change since prior study. 3. Periumbilical hernia containing fat.  No change. 4. Horseshoe kidney with diffuse renal atrophy. Nonobstructing stone in the right kidney. 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   Electronically Signed   By: Lucienne Capers M.D.   On: 04/22/2015 23:27    ____________________________________________   PROCEDURES  Procedure(s) performed: None  Critical Care performed: No  ____________________________________________   INITIAL IMPRESSION / ASSESSMENT AND PLAN / ED COURSE  Pertinent labs & imaging results that were available during my care of the patient were reviewed by me and considered in my medical decision making (see chart for  details).  The patient is moaning and appears to be in considerable pain.  I will treat with Dilaudid and Zofran and we will obtain a CT abdomen and pelvis for further evaluation.  Also checking a lactate.  ----------------------------------------- 12:21 AM on 04/23/2015 -----------------------------------------  The patient's lactate was normal.  A couple of hours After a single dose of Dilaudid, the patient rates his pain as 0, and he is sitting up in bed watching TV in no discomfort at all.  I discussed the CT results with the patient and his wife including his chronic SVC obstruction and the acute diverticulitis.  We discussed admission versus outpatient treatment, but given that he is stable with no pain and normal labs and normal vitals except for borderline tachycardia, we agree that he is appropriate for outpatient management.  However I gave him strict return precautions if he was to worsen and should return immediately to the emergency department.  He and his wife agree with the plan.  I am dosing his antibiotics for his intermittent dialysis. ____________________________________________   FINAL CLINICAL IMPRESSION(S) / ED DIAGNOSES  Final diagnoses:  Acute diverticulitis  Lower abdominal pain     Hinda Kehr, MD 04/23/15 [phone]

## 2015-04-22 NOTE — ED Notes (Signed)
Pt drinking contrast ATT

## 2015-04-22 NOTE — ED Notes (Signed)
Patient transported to CT 

## 2015-04-23 MED ORDER — METRONIDAZOLE 500 MG PO TABS: 500.0000 mg | ORAL_TABLET | Freq: Once | ORAL | Status: DC

## 2015-04-23 MED ORDER — CIPROFLOXACIN HCL 500 MG PO TABS: 500.0000 mg | ORAL_TABLET | ORAL | Status: DC

## 2015-04-23 MED ORDER — METRONIDAZOLE 500 MG PO TABS: ORAL_TABLET | ORAL | Status: DC

## 2015-04-23 MED ORDER — CIPROFLOXACIN HCL 500 MG PO TABS: ORAL_TABLET | ORAL | Status: DC

## 2015-04-23 MED ORDER — OXYCODONE-ACETAMINOPHEN 5-325 MG PO TABS: 1.0000 | ORAL_TABLET | ORAL | Status: DC | PRN

## 2015-04-23 NOTE — Discharge Instructions (Signed)
As we discussed, your CT scan indicates that you have acute diverticulitis.  Please read through the information below.  Take your medicines precisely as prescribed-it is important that you do this given your dialysis.  It is also important that you continue your dialysis as scheduled.  Follow-up with your doctor at the next available appointment.  If you are getting worse or develop new symptoms that concern you, please return immediately to the emergency department.   Diverticulitis Diverticulitis is when small pockets that have formed in your colon (large intestine) become infected or swollen. HOME CARE  Follow your doctor's instructions.  Follow a special diet if told by your doctor.  When you feel better, your doctor may tell you to change your diet. You may be told to eat a lot of fiber. Fruits and vegetables are good sources of fiber. Fiber makes it easier to poop (have bowel movements).  Take supplements or probiotics as told by your doctor.  Only take medicines as told by your doctor.  Keep all follow-up visits with your doctor. GET HELP IF:  Your pain does not get better.  You have a hard time eating food.  You are not pooping like normal. GET HELP RIGHT AWAY IF:  Your pain gets worse.  Your problems do not get better.  Your problems suddenly get worse.  You have a fever.  You keep throwing up (vomiting).  You have bloody or black, tarry poop (stool). MAKE SURE YOU:   Understand these instructions.  Will watch your condition.  Will get help right away if you are not doing well or get worse. Document Released: 05/14/2008 Document Revised: 12/01/2013 Document Reviewed: 10/21/2013 [facility] Patient Information 2015 [place], [place]. This information is not intended to replace advice given to you by your health care provider. Make sure you discuss any questions you have with your health care provider.

## 2015-04-24 ENCOUNTER — Encounter: Payer: Self-pay | Admitting: Emergency Medicine

## 2015-04-24 ENCOUNTER — Inpatient Hospital Stay
Admission: EM | Admit: 2015-04-24 | Discharge: 2015-04-26 | DRG: 391 | Disposition: A | Discharge status: Home / Self Care | Payer: Medicare Other | Attending: Internal Medicine | Admitting: Internal Medicine

## 2015-04-24 DIAGNOSIS — Z992 Dependence on renal dialysis: Secondary | ICD-10-CM | POA: Diagnosis not present

## 2015-04-24 DIAGNOSIS — R112 Nausea with vomiting, unspecified: Secondary | ICD-10-CM

## 2015-04-24 DIAGNOSIS — I120 Hypertensive chronic kidney disease with stage 5 chronic kidney disease or end stage renal disease: Secondary | ICD-10-CM | POA: Diagnosis present

## 2015-04-24 DIAGNOSIS — N186 End stage renal disease: Secondary | ICD-10-CM | POA: Diagnosis present

## 2015-04-24 DIAGNOSIS — D638 Anemia in other chronic diseases classified elsewhere: Secondary | ICD-10-CM | POA: Diagnosis present

## 2015-04-24 DIAGNOSIS — N2581 Secondary hyperparathyroidism of renal origin: Secondary | ICD-10-CM | POA: Diagnosis present

## 2015-04-24 DIAGNOSIS — Z79891 Long term (current) use of opiate analgesic: Secondary | ICD-10-CM | POA: Diagnosis not present

## 2015-04-24 DIAGNOSIS — E119 Type 2 diabetes mellitus without complications: Secondary | ICD-10-CM | POA: Diagnosis present

## 2015-04-24 DIAGNOSIS — F17210 Nicotine dependence, cigarettes, uncomplicated: Secondary | ICD-10-CM | POA: Diagnosis present

## 2015-04-24 DIAGNOSIS — F1290 Cannabis use, unspecified, uncomplicated: Secondary | ICD-10-CM | POA: Diagnosis present

## 2015-04-24 DIAGNOSIS — K5732 Diverticulitis of large intestine without perforation or abscess without bleeding: Principal | ICD-10-CM

## 2015-04-24 HISTORY — DX: Diverticulitis of intestine, part unspecified, without perforation or abscess without bleeding: K57.92

## 2015-04-24 LAB — CBC WITH DIFFERENTIAL/PLATELET
BASOS PCT: 1 %
Basophils Absolute: 0 10*3/uL (ref 0–0.1)
EOS PCT: 1 %
Eosinophils Absolute: 0.1 10*3/uL (ref 0–0.7)
HEMATOCRIT: 35.6 % — AB (ref 40.0–52.0)
Hemoglobin: 11.1 g/dL — ABNORMAL LOW (ref 13.0–18.0)
LYMPHS ABS: 0.9 10*3/uL — AB (ref 1.0–3.6)
Lymphocytes Relative: 16 %
MCH: 29.7 pg (ref 26.0–34.0)
MCHC: 31.1 g/dL — ABNORMAL LOW (ref 32.0–36.0)
MCV: 95.7 fL (ref 80.0–100.0)
MONOS PCT: 11 %
Monocytes Absolute: 0.6 10*3/uL (ref 0.2–1.0)
Neutro Abs: 3.7 10*3/uL (ref 1.4–6.5)
Neutrophils Relative %: 71 %
Platelets: 207 10*3/uL (ref 150–440)
RBC: 3.73 MIL/uL — ABNORMAL LOW (ref 4.40–5.90)
RDW: 16.1 % — AB (ref 11.5–14.5)
WBC: 5.2 10*3/uL (ref 3.8–10.6)

## 2015-04-24 LAB — COMPREHENSIVE METABOLIC PANEL
ALBUMIN: 3.9 g/dL (ref 3.5–5.0)
ALK PHOS: 55 U/L (ref 38–126)
ALT: 39 U/L (ref 17–63)
AST: 34 U/L (ref 15–41)
Anion gap: 17 — ABNORMAL HIGH (ref 5–15)
BUN: 42 mg/dL — ABNORMAL HIGH (ref 6–20)
CHLORIDE: 95 mmol/L — AB (ref 101–111)
CO2: 29 mmol/L (ref 22–32)
CREATININE: 15.06 mg/dL — AB (ref 0.61–1.24)
Calcium: 9.9 mg/dL (ref 8.9–10.3)
GFR calc Af Amer: 4 mL/min — ABNORMAL LOW (ref >60)
GFR calc non Af Amer: 3 mL/min — ABNORMAL LOW (ref >60)
Glucose, Bld: 101 mg/dL — ABNORMAL HIGH (ref 65–99)
POTASSIUM: 4.4 mmol/L (ref 3.5–5.1)
Sodium: 141 mmol/L (ref 135–145)
TOTAL PROTEIN: 8.3 g/dL — AB (ref 6.5–8.1)
Total Bilirubin: 0.4 mg/dL (ref 0.3–1.2)

## 2015-04-24 LAB — LIPASE, BLOOD: Lipase: 62 U/L — ABNORMAL HIGH (ref 22–51)

## 2015-04-24 MED ORDER — DOCUSATE SODIUM 100 MG PO CAPS
100.0000 mg | ORAL_CAPSULE | Freq: Two times a day (BID) | ORAL | Status: DC
  Filled 2015-04-24 (×4): qty 1

## 2015-04-24 MED ORDER — ASPIRIN EC 81 MG PO TBEC
81.0000 mg | DELAYED_RELEASE_TABLET | Freq: Every day | ORAL | Status: DC
  Administered 2015-04-26: 81 mg via ORAL
  Filled 2015-04-24 (×2): qty 1

## 2015-04-24 MED ORDER — SODIUM CHLORIDE 0.9 % IV SOLN: 250.0000 mL | INTRAVENOUS | Status: DC | PRN

## 2015-04-24 MED ORDER — SODIUM CHLORIDE 0.9 % IJ SOLN
3.0000 mL | Freq: Two times a day (BID) | INTRAMUSCULAR | Status: DC
  Administered 2015-04-25 – 2015-04-26 (×3): 3 mL via INTRAVENOUS

## 2015-04-24 MED ORDER — SODIUM CHLORIDE 0.9 % IJ SOLN: 3.0000 mL | Freq: Two times a day (BID) | INTRAMUSCULAR | Status: DC

## 2015-04-24 MED ORDER — SODIUM CHLORIDE 0.9 % IJ SOLN: 3.0000 mL | INTRAMUSCULAR | Status: DC | PRN

## 2015-04-24 MED ORDER — ONDANSETRON HCL 4 MG/2ML IJ SOLN
INTRAMUSCULAR | Status: AC
Start: 2015-04-24 — End: 2015-04-25
  Filled 2015-04-24: qty 2

## 2015-04-24 MED ORDER — METOPROLOL SUCCINATE ER 50 MG PO TB24
50.0000 mg | ORAL_TABLET | Freq: Every day | ORAL | Status: DC
  Administered 2015-04-25 – 2015-04-26 (×2): 50 mg via ORAL
  Filled 2015-04-24 (×3): qty 1

## 2015-04-24 MED ORDER — CIPROFLOXACIN IN D5W 200 MG/100ML IV SOLN
200.0000 mg | Freq: Two times a day (BID) | INTRAVENOUS | Status: AC
Start: 2015-04-24 — End: 2015-04-25
  Administered 2015-04-24 – 2015-04-25 (×3): 200 mg via INTRAVENOUS
  Filled 2015-04-24 (×4): qty 100

## 2015-04-24 MED ORDER — ONDANSETRON HCL 4 MG/2ML IJ SOLN
4.0000 mg | INTRAMUSCULAR | Status: DC | PRN
  Administered 2015-04-24 – 2015-04-26 (×4): 4 mg via INTRAVENOUS
  Filled 2015-04-24 (×4): qty 2

## 2015-04-24 MED ORDER — HEPARIN SODIUM (PORCINE) 5000 UNIT/ML IJ SOLN
5000.0000 [IU] | Freq: Three times a day (TID) | INTRAMUSCULAR | Status: DC
  Administered 2015-04-24 – 2015-04-25 (×3): 5000 [IU] via SUBCUTANEOUS
  Filled 2015-04-24 (×4): qty 1

## 2015-04-24 MED ORDER — HYDROMORPHONE HCL 1 MG/ML IJ SOLN
1.0000 mg | Freq: Once | INTRAMUSCULAR | Status: AC
  Administered 2015-04-24: 1 mg via INTRAVENOUS

## 2015-04-24 MED ORDER — ACETAMINOPHEN 500 MG PO TABS: 1000.0000 mg | ORAL_TABLET | Freq: Three times a day (TID) | ORAL | Status: DC | PRN

## 2015-04-24 MED ORDER — PIPERACILLIN-TAZOBACTAM 3.375 G IVPB
INTRAVENOUS | Status: AC
  Filled 2015-04-24: qty 50

## 2015-04-24 MED ORDER — ONDANSETRON HCL 4 MG/2ML IJ SOLN
4.0000 mg | Freq: Once | INTRAMUSCULAR | Status: AC
  Administered 2015-04-24: 4 mg via INTRAVENOUS

## 2015-04-24 MED ORDER — OXYCODONE-ACETAMINOPHEN 5-325 MG PO TABS: 1.0000 | ORAL_TABLET | ORAL | Status: DC | PRN

## 2015-04-24 MED ORDER — METRONIDAZOLE IN NACL 5-0.79 MG/ML-% IV SOLN
INTRAVENOUS | Status: AC
  Administered 2015-04-24: 500 mg via INTRAVENOUS
  Filled 2015-04-24: qty 100

## 2015-04-24 MED ORDER — METRONIDAZOLE IN NACL 5-0.79 MG/ML-% IV SOLN
500.0000 mg | Freq: Three times a day (TID) | INTRAVENOUS | Status: DC
  Administered 2015-04-24 – 2015-04-26 (×5): 500 mg via INTRAVENOUS
  Filled 2015-04-24 (×5): qty 100

## 2015-04-24 MED ORDER — HYDROMORPHONE HCL 1 MG/ML IJ SOLN
INTRAMUSCULAR | Status: AC
  Filled 2015-04-24: qty 1

## 2015-04-24 MED ORDER — SODIUM CHLORIDE 0.9 % IV SOLN
Freq: Once | INTRAVENOUS | Status: AC
  Administered 2015-04-24: 18:00:00 via INTRAVENOUS

## 2015-04-24 MED ORDER — ZOLPIDEM TARTRATE 5 MG PO TABS: 5.0000 mg | ORAL_TABLET | Freq: Every evening | ORAL | Status: DC | PRN

## 2015-04-24 MED ORDER — CINACALCET HCL 30 MG PO TABS
90.0000 mg | ORAL_TABLET | Freq: Every day | ORAL | Status: DC
  Filled 2015-04-24 (×4): qty 3

## 2015-04-24 MED ORDER — MORPHINE SULFATE 2 MG/ML IJ SOLN
2.0000 mg | INTRAMUSCULAR | Status: DC | PRN
  Administered 2015-04-24 – 2015-04-26 (×5): 2 mg via INTRAVENOUS
  Filled 2015-04-24 (×5): qty 1

## 2015-04-24 MED ORDER — ADULT MULTIVITAMIN W/MINERALS CH
1.0000 | ORAL_TABLET | Freq: Every day | ORAL | Status: DC
  Filled 2015-04-24 (×2): qty 1

## 2015-04-24 MED ORDER — PIPERACILLIN-TAZOBACTAM 3.375 G IVPB: 3.3750 g | Freq: Once | INTRAVENOUS | Status: DC

## 2015-04-24 MED ORDER — CALCIUM ACETATE (PHOS BINDER) 667 MG PO CAPS
2668.0000 mg | ORAL_CAPSULE | Freq: Three times a day (TID) | ORAL | Status: DC
  Filled 2015-04-24 (×5): qty 4

## 2015-04-24 NOTE — ED Notes (Signed)
Patient's monitor noted to be alarming at nurses station. o2 sats reading as 87% with good wave form noted. Patient noted to be in no obvious distress with respirations even and unlabored. Patient laying on right side with legs curled up. Placed patient on 3L O2 via [place]; o2 sats imrpoved to 100%. Notified assigned RN and ED MD.

## 2015-04-24 NOTE — H&P (Signed)
History and Physical    [name] [name] [id number] DOB: Apr 23, 1961 DOA: 04/24/2015  Referring physician: Dr. Jimmye Norman PCP: No PCP Per Patient  Specialists: Dr. Abigail Butts  Chief Complaint: Abdominal pain with N/V  HPI: [name] [name] is a 54 y.o. male has a past medical history significant for ESRD on HD now with intractable N/V and abdominal pain due to diverticulitis despite po Levaquin and Flagyl. Vomiting in ER requiring Dilaudid for pain. Now admitted for IV therapy.  Review of Systems: The patient denies  fever, weight loss,, vision loss, decreased hearing, hoarseness, chest pain, syncope, dyspnea on exertion, peripheral edema, balance deficits, hemoptysis, melena, hematochezia, severe indigestion/heartburn, hematuria, incontinence, genital sores, muscle weakness, suspicious skin lesions, transient blindness, difficulty walking, depression, unusual weight change, abnormal bleeding, enlarged lymph nodes, angioedema, and breast masses.   Past Medical History  Diagnosis Date  . Secondary hyperparathyroidism   . Hypertension   . Polysubstance abuse   . Anemia of chronic disease     BL Hgb ~10  . Tobacco abuse   . CHF (congestive heart failure)     pt denies 04/01/14  . Pneumonia     hx of  . End-stage renal disease on hemodialysis     HD m/w/f - right arm graft  . History of blood transfusion   . Pneumonia   . Sleep disturbance   . Renal insufficiency   . Diverticulitis    Past Surgical History  Procedure Laterality Date  . Surgery for horseshoe kidney    . Colonoscopy  12/16/2012    Procedure: COLONOSCOPY;  Surgeon: Milus Banister, MD;  Location: [place];  Service: Endoscopy;  Laterality: N/A;  . Video assisted thoracoscopy  12/18/2012    Procedure: VIDEO ASSISTED THORACOSCOPY;  Surgeon: [name] Nakayama, MD;  Location: [place];  Service: Thoracic;  Laterality: Right;  pleural peel  . Pleural effusion drainage  12/18/2012    Procedure: DRAINAGE OF PLEURAL EFFUSION;   Surgeon: [name] Nakayama, MD;  Location: [place];  Service: Thoracic;  Laterality: Right;  . Video bronchoscopy with insertion of interbronchial valve (ibv)  01/09/2013    Procedure: VIDEO BRONCHOSCOPY WITH INSERTION OF INTERBRONCHIAL VALVE (IBV);  Surgeon: [name] Nakayama, MD;  Location: [place];  Service: Thoracic;  Laterality: N/A;  placement of 2 Intrabronchial valves in right lower lobe  . Tee without cardioversion N/A 01/16/2013    Procedure: TRANSESOPHAGEAL ECHOCARDIOGRAM (TEE);  Surgeon: Sanda Klein, MD;  Location: [place];  Service: Cardiovascular;  Laterality: N/A;  . Talc pleurodesis Right 02/05/2013    Procedure: Pietro Cassis;  Surgeon: [name] Nakayama, MD;  Location: [place];  Service: Thoracic;  Laterality: Right;  . Video bronchoscopy with insertion of interbronchial valve (ibv) Right 02/19/2013    Procedure: VIDEO BRONCHOSCOPY WITH INSERTION OF INTERBRONCHIAL VALVE (IBV);  Surgeon: [name] Nakayama, MD;  Location: [place];  Service: Thoracic;  Laterality: Right;  removal of two interbronchial valves right chest  . Video assisted thoracoscopy (vats)/thorocotomy Right 02/19/2013    Procedure: VIDEO ASSISTED THORACOSCOPY (VATS)/THOROCOTOMY;  Surgeon: [name] Nakayama, MD;  Location: [place];  Service: Thoracic;  Laterality: Right;  Right Video Assisted Thoracoscopy, Right Thoracotomy, Decortication,   . Chest tube insertion  02/2013  . Appendectomy     Social History:  reports that he has been smoking Cigarettes.  He has a 120 pack-year smoking history. He uses smokeless tobacco. He reports that he uses illicit drugs (Marijuana). He reports that he does not drink alcohol.  Allergies  Allergen Reactions  . Tape Other (See Comments)    Plastic Tape Causes blister    Family History  Problem Relation Age of Onset  . Thyroid disease Mother   . Renal Disease Maternal Grandmother   . Diabetes Maternal Grandmother   . Hypertension Maternal Aunt   . Hypertension  Maternal Uncle     Prior to Admission medications   Medication Sig Start Date End Date Taking? Authorizing Provider  acetaminophen (TYLENOL) 500 MG tablet Take 1,000 mg by mouth every 8 (eight) hours as needed for moderate pain.    Historical Provider, MD  Bismuth Subsalicylate (KAOPECTATE PO) Take 15 mLs by mouth daily as needed (for nausea).    Historical Provider, MD  calcium acetate (PHOSLO) 667 MG capsule Take 2,668 mg by mouth 3 (three) times daily with meals.     Historical Provider, MD  cinacalcet (SENSIPAR) 90 MG tablet Take 90 mg by mouth daily.    Historical Provider, MD  ciprofloxacin (CIPRO) 500 MG tablet Take 1 tablet by mouth daily.  On your dialysis days, take the tablet AFTER you complete dialysis. 04/23/15 05/01/15  Hinda Kehr, MD  levofloxacin (LEVAQUIN) 250 MG tablet Take 1 tablet (250 mg total) by mouth every other day. Patient taking differently: Take 250 mg by mouth every Monday, Wednesday, and Friday.  04/18/15   Aldean Jewett, MD  metoprolol succinate (TOPROL-XL) 50 MG 24 hr tablet Take 50 mg by mouth daily. Take with or immediately following a meal.    Historical Provider, MD  metroNIDAZOLE (FLAGYL) 500 MG tablet Take 1 tablet by mouth three times a day.  On your dialysis days, take the tablet AFTER your dialysis. 04/23/15 05/02/15  Hinda Kehr, MD  oxyCODONE-acetaminophen (ROXICET) 5-325 MG per tablet Take 1 tablet by mouth every 4 (four) hours as needed for severe pain. 04/23/15   Hinda Kehr, MD  zolpidem (AMBIEN CR) 6.25 MG CR tablet Take 6.25 mg by mouth at bedtime as needed for sleep.    Historical Provider, MD   Physical Exam: Filed Vitals:   04/24/15 1453 04/24/15 1800  BP: 160/98 95/68  Pulse: 93 88  Temp: 97.5 F (36.4 C)   TempSrc: Oral   Resp: 26 20  Height: 6\' 1"  (1.854 m)   Weight: 110.678 kg (244 lb)   SpO2: 96%      General:  No apparent distress  Eyes: PERRL, EOMI, no scleral icterus  ENT: moist oropharynx  Neck: supple, no  lymphadenopathy  Cardiovascular: regular rate without MRG; 2+ peripheral pulses, no JVD, no peripheral edema  Respiratory: CTA biL, good air movement without wheezing, rhonchi or crackled  Abdomen:  tender to palpation, positive bowel sounds,  guarding, no rebound  Skin: no rashes  Musculoskeletal: normal bulk and tone, no joint swelling  Psychiatric: normal mood and affect  Neurologic: CN 2-12 grossly intact, MS 5/5 in all 4  Labs on Admission:  Basic Metabolic Panel:  Recent Labs Lab 04/18/15 0456 04/18/15 0742 04/22/15 1728 04/24/15 1516  NA 140 141 139 141  K 4.6 4.2 4.0 4.4  CL 96* 97* 94* 95*  CO2 29 30 32 29  GLUCOSE 103* 143* 107* 101*  BUN 65* 64* 22* 42*  CREATININE 17.42* 17.50* 9.28* 15.06*  CALCIUM 9.1 9.1 9.5 9.9  PHOS  --  8.2*  --   --    Liver Function Tests:  Recent Labs Lab 04/18/15 0742 04/22/15 1728 04/24/15 1516  AST  --  20 34  ALT  --  16* 39  ALKPHOS  --  56 55  BILITOT  --  0.4 0.4  PROT  --  9.0* 8.3*  ALBUMIN 3.3* 3.9 3.9    Recent Labs Lab 04/22/15 1728 04/24/15 1516  LIPASE 65* 62*   No results for input(s): AMMONIA in the last 168 hours. CBC:  Recent Labs Lab 04/18/15 0456 04/18/15 0742 04/22/15 1728 04/24/15 1516  WBC 7.4 6.6 5.8 5.2  NEUTROABS  --   --  4.3 3.7  HGB 9.9* 9.4* 11.3* 11.1*  HCT 31.8* 30.1* 36.4* 35.6*  MCV 96.4 96.0 95.5 95.7  PLT 114* 106* 175 207   Cardiac Enzymes: No results for input(s): CKTOTAL, CKMB, CKMBINDEX, TROPONINI in the last 168 hours.  BNP (last 3 results) No results for input(s): BNP in the last 8760 hours.  ProBNP (last 3 results) No results for input(s): PROBNP in the last 8760 hours.  CBG: No results for input(s): GLUCAP in the last 168 hours.  Radiological Exams on Admission: Ct Abdomen Pelvis W Contrast  04/22/2015   CLINICAL DATA:  Abdominal pain starting today.  Vomiting.  EXAM: CT ABDOMEN AND PELVIS WITH CONTRAST  TECHNIQUE: Multidetector CT imaging of the  abdomen and pelvis was performed using the standard protocol following bolus administration of intravenous contrast.  CONTRAST:  161mL OMNIPAQUE IOHEXOL 300 MG/ML  SOLN  COMPARISON:  04/06/2014  FINDINGS: Dependent atelectasis and scarring in the lung bases similar to prior study.  The liver, spleen, gallbladder, pancreas, adrenal glands, abdominal aorta, inferior vena cava, and retroperitoneal lymph nodes are unremarkable. There are extensive venous varices demonstrated throughout the abdominal wall and the pelvis with venous collaterals extending to the left groin region. These appear to be receiving contrast material from the chest. Findings suggest possible SVC obstruction. Filling defect in the inferior vena cava is consistent with mixing of non-opacified blood. This appearance is unchanged since prior study. 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 No hydronephrosis in either kidney. Small nonobstructing stone in the right kidney is unchanged. The stomach, small bowel, and colon are not abnormally distended. No free air or free fluid in the abdomen. Moderate-sized periumbilical hernia containing fat. No infiltration.  Pelvis: The appendix is normal. Diverticulosis of the sigmoid colon with minimal infiltration in the pericolonic fat, new since prior study, suggesting acute diverticulitis. No abscess. Bladder is decompressed. Prostate gland is not enlarged. No free or loculated pelvic fluid collections. No pelvic mass or lymphadenopathy. Mild diffuse sclerosis demonstrated in the bones is nonspecific but could indicate renal osteodystrophy. No destructive bone lesions.  IMPRESSION: 1. Changes of acute diverticulitis.  No abscess. 2. Chronic finding of multiple venous collaterals throughout the lower chest and abdominal wall. This raises suspicion of SVC obstruction. No change since prior study. 3. Periumbilical hernia containing fat.  No change. 4. Horseshoe kidney with diffuse renal atrophy.  Nonobstructing stone in the right kidney. 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   Electronically Signed   By: Lucienne Capers M.D.   On: 04/22/2015 23:27    EKG: Independently reviewed.  Assessment/Plan Principal Problem:   Diverticulitis of colon   Will admit for IV ABX and pain control Consult Nephrology for HD. F/u labs in AM.  Diet: clear liquids Fluids: see record DVT Prophylaxis: SQ Heparin  Code Status: Full  Family Communication: yes  Disposition Plan: Home  Time spent: 50 min

## 2015-04-24 NOTE — ED Notes (Signed)
Pt reports epi gastric pain that started this am. Pt endorses vomiting. Unknown fever. Reports history of diverticulitis recently seen yesterday. Pain worse so pt states he returned today.

## 2015-04-24 NOTE — ED Provider Notes (Signed)
Note entered in error  Earleen Newport, MD 04/27/15 1051

## 2015-04-24 NOTE — ED Provider Notes (Signed)
[facility] Emergency Department Provider Note     Time seen: 1712  I have reviewed the triage vital signs and the nursing notes.   HISTORY  Chief Complaint Abdominal Pain    HPI [name] [name] is a 54 y.o. male who presents to ER for persistent abdominal pain and vomiting. Patient states she was seen in ER yesterday and was diagnosed with diverticulitis. He states pain worse so he came back to the ER today. He was offered admission yesterday but did not want to stay. His pain is sharp left-sided worse with food severe.  Past Medical History  Diagnosis Date  . Secondary hyperparathyroidism   . Hypertension   . Polysubstance abuse   . Anemia of chronic disease     BL Hgb ~10  . Tobacco abuse   . CHF (congestive heart failure)     pt denies 04/01/14  . Pneumonia     hx of  . End-stage renal disease on hemodialysis     HD m/w/f - right arm graft  . History of blood transfusion   . Pneumonia   . Sleep disturbance   . Renal insufficiency   . Diverticulitis     Patient Active Problem List   Diagnosis Date Noted  . Community acquired pneumonia 04/18/2015  . Sepsis 04/16/2015  . S/P thoracotomy 03/04/2013  . 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 Bacteremia 01/06/2013  . Benign neoplasm of colon 12/16/2012  . Diverticulosis of colon (without mention of hemorrhage) 12/16/2012  . ESRD (end stage renal disease) 12/12/2012  . H/O: hypertension 12/12/2012  . Pleural effusion, right 12/12/2012  . Hyperkalemia 12/11/2012  . Acute blood loss anemia 12/11/2012  . Sinus tachycardia, resolved with PRBC resuscitation 12/11/2012  . Lower GI bleed 12/11/2012  . End-stage renal disease on hemodialysis   . Secondary hyperparathyroidism   . Hypertension   . Polysubstance abuse   . Anemia of chronic disease   . Tobacco abuse     Past Surgical History  Procedure Laterality Date  . Surgery for horseshoe kidney    . Colonoscopy  12/16/2012   Procedure: COLONOSCOPY;  Surgeon: Milus Banister, MD;  Location: [place];  Service: Endoscopy;  Laterality: N/A;  . Video assisted thoracoscopy  12/18/2012    Procedure: VIDEO ASSISTED THORACOSCOPY;  Surgeon: Melrose Nakayama, MD;  Location: [place];  Service: Thoracic;  Laterality: Right;  pleural peel  . Pleural effusion drainage  12/18/2012    Procedure: DRAINAGE OF PLEURAL EFFUSION;  Surgeon: Melrose Nakayama, MD;  Location: [place];  Service: Thoracic;  Laterality: Right;  . Video bronchoscopy with insertion of interbronchial valve (ibv)  01/09/2013    Procedure: VIDEO BRONCHOSCOPY WITH INSERTION OF INTERBRONCHIAL VALVE (IBV);  Surgeon: Melrose Nakayama, MD;  Location: [place];  Service: Thoracic;  Laterality: N/A;  placement of 2 Intrabronchial valves in right lower lobe  . Tee without cardioversion N/A 01/16/2013    Procedure: TRANSESOPHAGEAL ECHOCARDIOGRAM (TEE);  Surgeon: Sanda Klein, MD;  Location: [place];  Service: Cardiovascular;  Laterality: N/A;  . Talc pleurodesis Right 02/05/2013    Procedure: Pietro Cassis;  Surgeon: Melrose Nakayama, MD;  Location: [place];  Service: Thoracic;  Laterality: Right;  . Video bronchoscopy with insertion of interbronchial valve (ibv) Right 02/19/2013    Procedure: VIDEO BRONCHOSCOPY WITH INSERTION OF INTERBRONCHIAL VALVE (IBV);  Surgeon: Melrose Nakayama, MD;  Location: [place];  Service: Thoracic;  Laterality: Right;  removal of two interbronchial valves right  chest  . Video assisted thoracoscopy (vats)/thorocotomy Right 02/19/2013    Procedure: VIDEO ASSISTED THORACOSCOPY (VATS)/THOROCOTOMY;  Surgeon: Melrose Nakayama, MD;  Location: [place];  Service: Thoracic;  Laterality: Right;  Right Video Assisted Thoracoscopy, Right Thoracotomy, Decortication,   . Chest tube insertion  02/2013  . Appendectomy      Current Outpatient Rx  Name  Route  Sig  Dispense  Refill  . acetaminophen (TYLENOL) 500 MG tablet   Oral   Take 1,000 mg by  mouth every 8 (eight) hours as needed for moderate pain.         . Bismuth Subsalicylate (KAOPECTATE PO)   Oral   Take 15 mLs by mouth daily as needed (for nausea).         . calcium acetate (PHOSLO) 667 MG capsule   Oral   Take 2,668 mg by mouth 3 (three) times daily with meals.          . cinacalcet (SENSIPAR) 90 MG tablet   Oral   Take 90 mg by mouth daily.         . ciprofloxacin (CIPRO) 500 MG tablet      Take 1 tablet by mouth daily.  On your dialysis days, take the tablet AFTER you complete dialysis.   9 tablet   0   . levofloxacin (LEVAQUIN) 250 MG tablet   Oral   Take 1 tablet (250 mg total) by mouth every other day. Patient taking differently: Take 250 mg by mouth every Monday, Wednesday, and Friday.    3 tablet   0   . metoprolol succinate (TOPROL-XL) 50 MG 24 hr tablet   Oral   Take 50 mg by mouth daily. Take with or immediately following a meal.         . metroNIDAZOLE (FLAGYL) 500 MG tablet      Take 1 tablet by mouth three times a day.  On your dialysis days, take the tablet AFTER your dialysis.   30 tablet   0   . oxyCODONE-acetaminophen (ROXICET) 5-325 MG per tablet   Oral   Take 1 tablet by mouth every 4 (four) hours as needed for severe pain.   20 tablet   0   . zolpidem (AMBIEN CR) 6.25 MG CR tablet   Oral   Take 6.25 mg by mouth at bedtime as needed for sleep.           Allergies Tape  Family History  Problem Relation Age of Onset  . Thyroid disease Mother   . Renal Disease Maternal Grandmother   . Diabetes Maternal Grandmother   . Hypertension Maternal Aunt   . Hypertension Maternal Uncle     Social History History  Substance Use Topics  . Smoking status: Current Every Day Smoker -- 6.00 packs/day for 20 years    Types: Cigarettes  . Smokeless tobacco: Current User  . Alcohol Use: No     Comment: occasional    Review of Systems Constitutional: Negative for fever. Eyes: Negative for visual changes. ENT:  Negative for sore throat. Cardiovascular: Negative for chest pain. Respiratory: Negative for shortness of breath. Gastrointestinal: Positive for bowel pain or vomiting Genitourinary: Negative for dysuria. Musculoskeletal: Negative for back pain. Skin: Negative for rash. Neurological: Negative for headaches, focal weakness or numbness.  10-point ROS otherwise negative.  ____________________________________________   PHYSICAL EXAM:  VITAL SIGNS: ED Triage Vitals  Enc Vitals Group     BP 04/24/15 1453 160/98 mmHg     Pulse  Rate 04/24/15 1453 93     Resp 04/24/15 1453 26     Temp 04/24/15 1453 97.5 F (36.4 C)     Temp Source 04/24/15 1453 Oral     SpO2 04/24/15 1453 96 %     Weight 04/24/15 1453 244 lb (110.678 kg)     Height 04/24/15 1453 6\' 1"  (1.854 m)     Head Cir --      Peak Flow --      Pain Score 04/24/15 1455 10     Pain Loc --      Pain Edu? --      Excl. in [place]? --     Constitutional: Alert and oriented. Mild distress Eyes: Conjunctivae are normal. PERRL. Normal extraocular movements. ENT   Head: Normocephalic and atraumatic.   Nose: No congestion/rhinnorhea.   Mouth/Throat: Mucous membranes are moist.   Neck: No stridor. Hematological/Lymphatic/Immunilogical: No cervical lymphadenopathy. Cardiovascular: Normal rate, regular rhythm. Normal and symmetric distal pulses are present in all extremities. No murmurs, rubs, or gallops. Respiratory: Normal respiratory effort without tachypnea nor retractions. Breath sounds are clear and equal bilaterally. No wheezes/rales/rhonchi. Gastrointestinal: Left-sided abdominal tenderness no rebound or guarding. Musculoskeletal: Nontender with normal range of motion in all extremities. No joint effusions.  No lower extremity tenderness nor edema. Neurologic:  Normal speech and language. No gross focal neurologic deficits are appreciated. Speech is normal. No gait instability. Skin:  Skin is warm, dry and intact. No  rash noted. Psychiatric: Mood and affect are normal. Speech and behavior are normal. Patient exhibits appropriate insight and judgment.  ____________________________________________    LABS (pertinent positives/negatives)  Labs Reviewed  LIPASE, BLOOD - Abnormal; Notable for the following:    Lipase 62 (*)    All other components within normal limits  COMPREHENSIVE METABOLIC PANEL - Abnormal; Notable for the following:    Chloride 95 (*)    Glucose, Bld 101 (*)    BUN 42 (*)    Creatinine, Ser 15.06 (*)    Total Protein 8.3 (*)    GFR calc non Af Amer 3 (*)    GFR calc Af Amer 4 (*)    Anion gap 17 (*)    All other components within normal limits  CBC WITH DIFFERENTIAL/PLATELET - Abnormal; Notable for the following:    RBC 3.73 (*)    Hemoglobin 11.1 (*)    HCT 35.6 (*)    MCHC 31.1 (*)    RDW 16.1 (*)    Lymphs Abs 0.9 (*)    All other components within normal limits  URINALYSIS COMPLETEWITH MICROSCOPIC [facility])     ____________________________________________  ED COURSE:  Pertinent labs & imaging results that were available during my care of the patient were reviewed by me and considered in my medical decision making (see chart for details).   ____________________________________________   RADIOLOGY  CT scan from yesterday was reviewed. It revealed diverticulitis but no perforation  ____________________________________________    FINAL ASSESSMENT AND PLAN  Diverticulitis  Plan: Patient has failed outpatient treatment, at least for pain and nausea. Patient is requesting admission may require observation with IV antiemetics and pain control.    Earleen Newport, MD   Earleen Newport, MD 04/27/15 [phone]

## 2015-04-25 LAB — CBC
HCT: 34.6 % — ABNORMAL LOW (ref 40.0–52.0)
HEMOGLOBIN: 10.6 g/dL — AB (ref 13.0–18.0)
MCH: 29.7 pg (ref 26.0–34.0)
MCHC: 30.7 g/dL — AB (ref 32.0–36.0)
MCV: 96.5 fL (ref 80.0–100.0)
PLATELETS: 187 10*3/uL (ref 150–440)
RBC: 3.58 MIL/uL — AB (ref 4.40–5.90)
RDW: 16.5 % — ABNORMAL HIGH (ref 11.5–14.5)
WBC: 6.1 10*3/uL (ref 3.8–10.6)

## 2015-04-25 LAB — COMPREHENSIVE METABOLIC PANEL
ALK PHOS: 58 U/L (ref 38–126)
ALT: 34 U/L (ref 17–63)
ANION GAP: 15 (ref 5–15)
AST: 25 U/L (ref 15–41)
Albumin: 3.7 g/dL (ref 3.5–5.0)
BILIRUBIN TOTAL: 0.6 mg/dL (ref 0.3–1.2)
BUN: 48 mg/dL — AB (ref 6–20)
CHLORIDE: 97 mmol/L — AB (ref 101–111)
CO2: 28 mmol/L (ref 22–32)
Calcium: 9.6 mg/dL (ref 8.9–10.3)
Creatinine, Ser: 16.47 mg/dL — ABNORMAL HIGH (ref 0.61–1.24)
GFR calc non Af Amer: 3 mL/min — ABNORMAL LOW (ref >60)
GFR, EST AFRICAN AMERICAN: 3 mL/min — AB (ref >60)
GLUCOSE: 115 mg/dL — AB (ref 65–99)
POTASSIUM: 4.8 mmol/L (ref 3.5–5.1)
Sodium: 140 mmol/L (ref 135–145)
Total Protein: 7.7 g/dL (ref 6.5–8.1)

## 2015-04-25 LAB — GLUCOSE, CAPILLARY: GLUCOSE-CAPILLARY: 102 mg/dL — AB (ref 65–99)

## 2015-04-25 MED ORDER — CIPROFLOXACIN IN D5W 400 MG/200ML IV SOLN: 400.0000 mg | INTRAVENOUS | Status: DC

## 2015-04-25 NOTE — Care Management Note (Signed)
Case Management Note  Patient Details  Name: EUCLIDES MIFSUD MRN: [id number] Date of Birth: [birth year]  Subjective/Objective:    Recent admission to [facility] May 7-9 with sepsis and PNA. Readmitted at this time with diverticulitis.   Pt states he lives at home with his son. He is independent and drives.  Pt has dialysis M-W-F and drives himself. Denies issues obtaining medications, copays or medical care. Has no PCP, states he never gets sick. Reports he uses nephrologist for acute needs.   No needs anticipated at this time.           Action/Plan: Home   Expected Discharge Date:                  Expected Discharge Plan:  Home/Self Care  In-House Referral:     Discharge planning Services     Post Acute Care Choice:    Choice offered to:     DME Arranged:    DME Agency:     HH Arranged:    [place]:     Status of Service:     Medicare Important Message Given:  Yes Date Medicare IM Given:  04/25/15 Medicare IM give by:  Orvan July, RN BSN Date Additional Medicare IM Given:    Additional Medicare Important Message give by:     If discussed at [place] of Stay Meetings, dates discussed:    Additional Comments:  Jolly Mango, RN 04/25/2015, 2:23 PM

## 2015-04-25 NOTE — Progress Notes (Signed)
ANTIBIOTIC CONSULT NOTE - INITIAL  Pharmacy Consult for renal dose adjustment of antibiotics Indication: diverticulitis  Allergies  Allergen Reactions  . Tape Other (See Comments)    Plastic Tape Causes blister    Patient Measurements: Height: 6\' 1"  (185.4 cm) Weight: 243 lb 4.8 oz (110.36 kg) IBW/kg (Calculated) : 79.9  Vital Signs: Temp: 97.6 F (36.4 C) (05/16 0753) Temp Source: Oral (05/16 0753) BP: 105/55 mmHg (05/16 0753) Pulse Rate: 94 (05/16 0753)  Labs:  Recent Labs  04/22/15 1728 04/24/15 1516 04/25/15 0518  WBC 5.8 5.2 6.1  HGB 11.3* 11.1* 10.6*  PLT 175 207 187  CREATININE 9.28* 15.06* 16.47*    Microbiology: Recent Results (from the past 720 hour(s))  Blood Culture (routine x 2)     Status: None   Collection Time: 04/16/15 11:15 AM  Result Value Ref Range Status   Specimen Description BLOOD  Final   Special Requests NONE  Final   Culture NO GROWTH 5 DAYS  Final   Report Status 04/21/2015 FINAL  Final  Blood Culture (routine x 2)     Status: None   Collection Time: 04/16/15 11:25 AM  Result Value Ref Range Status   Specimen Description BLOOD  Final   Special Requests NONE  Final   Culture NO GROWTH 5 DAYS  Final   Report Status 04/21/2015 FINAL  Final    Medications:  Anti-infectives    Start     Dose/Rate Route Frequency Ordered Stop   04/26/15 1800  ciprofloxacin (CIPRO) IVPB 400 mg     400 mg 200 mL/hr over 60 Minutes Intravenous Every 24 hours 04/25/15 1426     04/24/15 2200  ciprofloxacin (CIPRO) IVPB 200 mg     200 mg 100 mL/hr over 60 Minutes Intravenous Every 12 hours 04/24/15 1827 04/26/15 0959   04/24/15 1830  metroNIDAZOLE (FLAGYL) IVPB 500 mg     500 mg 100 mL/hr over 60 Minutes Intravenous Every 8 hours 04/24/15 1827     04/24/15 1815  piperacillin-tazobactam (ZOSYN) IVPB 3.375 g  Status:  Discontinued     3.375 g 12.5 mL/hr over 240 Minutes Intravenous  Once 04/24/15 1810 04/24/15 1827     Assessment: Pharmacy  consulted to renal dose adjust antibiotics in this 54 year old male with ESRD on HD.  Plan:  Current orders for cipro 200mg  IV Q12H, changed to cipro 400mg  IV Q24H to be given after HD on dialysis days.  Metronidazole 500mg  IV Q8h - no adjustment needed  Pharmacy to follow per consult  Rexene Edison, PharmD Clinical Pharmacist 04/25/2015,2:28 PM

## 2015-04-26 LAB — GLUCOSE, CAPILLARY: GLUCOSE-CAPILLARY: 90 mg/dL (ref 65–99)

## 2015-04-26 MED ORDER — CIPROFLOXACIN HCL 500 MG PO TABS: 500.0000 mg | ORAL_TABLET | Freq: Two times a day (BID) | ORAL | Status: DC

## 2015-04-26 MED ORDER — METRONIDAZOLE 500 MG PO TABS
500.0000 mg | ORAL_TABLET | Freq: Three times a day (TID) | ORAL | Status: DC
  Administered 2015-04-26: 500 mg via ORAL
  Filled 2015-04-26: qty 1

## 2015-04-26 MED ORDER — METRONIDAZOLE 500 MG PO TABS: 500.0000 mg | ORAL_TABLET | Freq: Three times a day (TID) | ORAL | Status: DC

## 2015-04-26 MED ORDER — CIPROFLOXACIN HCL 500 MG PO TABS: 500.0000 mg | ORAL_TABLET | Freq: Every day | ORAL | Status: DC

## 2015-04-26 MED ORDER — OXYCODONE HCL 10 MG PO TABS: 10.0000 mg | ORAL_TABLET | Freq: Four times a day (QID) | ORAL | Status: DC | PRN

## 2015-04-26 MED ORDER — OXYCODONE HCL 5 MG PO TABS: 10.0000 mg | ORAL_TABLET | Freq: Four times a day (QID) | ORAL | Status: DC | PRN

## 2015-04-26 MED ORDER — CIPROFLOXACIN HCL 500 MG PO TABS
500.0000 mg | ORAL_TABLET | Freq: Every day | ORAL | Status: DC
  Administered 2015-04-26: 500 mg via ORAL
  Filled 2015-04-26: qty 1

## 2015-04-26 NOTE — Progress Notes (Signed)
[facility] [place] NAME: Edwin Dudek    MR#:  [id number]  DATE OF BIRTH:  September 09, 1961  SUBJECTIVE:  Came in with intractable vomiting and  REVIEW OF SYSTEMS:    Review of Systems  Constitutional: Negative for fever, chills and weight loss.  HENT: Negative for ear discharge, ear pain and nosebleeds.   Eyes: Negative for blurred vision, pain and discharge.  Respiratory: Negative for sputum production, shortness of breath, wheezing and stridor.   Cardiovascular: Negative for chest pain, palpitations, orthopnea and PND.  Gastrointestinal: Positive for nausea and abdominal pain. Negative for vomiting and diarrhea.  Genitourinary: Negative for urgency and frequency.  Musculoskeletal: Negative for back pain and joint pain.  Neurological: Negative for sensory change, speech change, focal weakness and weakness.  Psychiatric/Behavioral: Negative for depression. The patient is not nervous/anxious.   All other systems reviewed and are negative.   Tolerating Diet:CLD   DRUG ALLERGIES:   Allergies  Allergen Reactions  . Tape Other (See Comments)    Plastic Tape Causes blister    VITALS:  Blood pressure 113/67, pulse 82, temperature 98.9 F (37.2 C), temperature source Oral, resp. rate 20, height 6\' 1"  (1.854 m), weight 113.082 kg (249 lb 4.8 oz), SpO2 95 %.  PHYSICAL EXAMINATION:   Physical Exam  GENERAL:  54 y.o.-year-old patient lying in the bed with no acute distress.  EYES: Pupils equal, round, reactive to light and accommodation. No scleral icterus. Extraocular muscles intact.  HEENT: Head atraumatic, normocephalic. Oropharynx and nasopharynx clear.  NECK:  Supple, no jugular venous distention. No thyroid enlargement, no tenderness.  LUNGS: Normal breath sounds bilaterally, no wheezing, rales, rhonchi. No use of accessory muscles of respiration.  CARDIOVASCULAR: S1, S2 normal. No murmurs, rubs, or gallops.  ABDOMEN:  Soft,mild gen tenderness, nondistended. Bowel sounds present. No organomegaly or mass.  EXTREMITIES: No cyanosis, clubbing or edema b/l.    NEUROLOGIC: Cranial nerves II through XII are intact. No focal Motor or sensory deficits b/l.   PSYCHIATRIC: The patient is alert and oriented x 3.  SKIN: No obvious rash, lesion, or ulcer.    LABORATORY PANEL:   CBC  Recent Labs Lab 04/25/15 0518  WBC 6.1  HGB 10.6*  HCT 34.6*  PLT 187   ------------------------------------------------------------------------------------------------------------------  Chemistries   Recent Labs Lab 04/25/15 0518  NA 140  K 4.8  CL 97*  CO2 28  GLUCOSE 115*  BUN 48*  CREATININE 16.47*  CALCIUM 9.6  AST 25  ALT 34  ALKPHOS 58  BILITOT 0.6   ------------------------------------------------------------------------------------------------------------------  Cardiac Enzymes No results for input(s): TROPONINI in the last 168 hours. ------------------------------------------------------------------------------------------------------------------  RADIOLOGY:  No results found.   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  1. Acute Diverticulitis -IV cipro and flagyl. -FLD for next few days -able to take po diet -afebrile and wbc stable  2.ESRD on HD -nephrology consult obtained -pt will resume his outpt HD schedule  3.HTN -cont metoprolol  4.secondary hyperParathyroidism  All the records are reviewed and case discussed with Care Management/Social Workerr. Management plans discussed with the patient, family and they are in agreement.  CODE STATUS: FULL  DVT Prophylaxis: Heparin SQ  TOTAL TIME TAKING CARE OF THIS PATIENT: 30 minutes.   POSSIBLE D/C IN 1 DAYS, DEPENDING ON CLINICAL CONDITION.   Lamont Glasscock M.D on 04/26/2015 at 8:17 AM  Between 7am to 6pm -  Pager - [phone]  After 6pm go to [email or web address] -  password [email or web address] [facility]  [facility]  Office  [phone]  CC: Primary care physician; No PCP Per Patient

## 2015-04-26 NOTE — Discharge Summary (Signed)
[facility] at [place] NAME: Nekko Coslow    MR#:  [id number]  DATE OF BIRTH:  May 01, 1961  DATE OF ADMISSION:  04/24/2015 ADMITTING PHYSICIAN: Idelle Crouch, MD  DATE OF DISCHARGE: 04/26/15  PRIMARY NEPHROLOGIST: Dr Juleen China  ADMISSION DIAGNOSIS:  Diverticulitis of large intestine without perforation or abscess without bleeding [K57.32] Nausea and vomiting, vomiting of unspecified type [R11.2]  DISCHARGE DIAGNOSIS:  Acute Dvierticulitis  SECONDARY DIAGNOSIS:   Past Medical History  Diagnosis Date  . Secondary hyperparathyroidism   . Hypertension   . Polysubstance abuse   . Anemia of chronic disease     BL Hgb ~10  . Tobacco abuse   . CHF (congestive heart failure)     pt denies 04/01/14  . Pneumonia     hx of  . End-stage renal disease on hemodialysis     HD m/w/f - right arm graft  . History of blood transfusion   . Pneumonia   . Sleep disturbance   . Renal insufficiency   . Diverticulitis     HOSPITAL COURSE:  54 year old MR [place]rawiec with ESRD on HD, HTN, Dm-2 comes in with increasing abdominal pain..  1. Acute Diverticulitis -IV cipro and flagyl. Pt tolerating po's well and now changed to po cipro and flagyl for 10 days -FLD for next few days -able to take po diet -afebrile and wbc stable  2.ESRD on HD -nephrology consult obtained -pt will resume his outpt HD schedule  3.HTN -cont metoprolol  4.secondary hyperParathyroidism  Overall improving. D/c to home   DISCHARGE CONDITIONS:   fair  CONSULTS OBTAINED:    Munsoor Lateef, MD  DRUG ALLERGIES:   Allergies  Allergen Reactions  . Tape Other (See Comments)    Plastic Tape Causes blister    DISCHARGE MEDICATIONS:   Current Discharge Medication List    START taking these medications   Details  oxyCODONE 10 MG TABS Take 1 tablet (10 mg total) by mouth every 6 (six) hours as needed for moderate pain. Qty: 25 tablet, Refills: 0       CONTINUE these medications which have CHANGED   Details  ciprofloxacin (CIPRO) 500 MG tablet Take 1 tablet (500 mg total) by mouth daily at 8 pm. Qty: 9 tablet, Refills: 0    metroNIDAZOLE (FLAGYL) 500 MG tablet Take 1 tablet (500 mg total) by mouth 3 (three) times daily. Qty: 27 tablet, Refills: 0      CONTINUE these medications which have NOT CHANGED   Details  acetaminophen (TYLENOL) 500 MG tablet Take 1,000 mg by mouth every 8 (eight) hours as needed for moderate pain.    Bismuth Subsalicylate (KAOPECTATE PO) Take 15 mLs by mouth daily as needed (for nausea).    calcium acetate (PHOSLO) 667 MG capsule Take 2,668 mg by mouth 3 (three) times daily with meals.     cinacalcet (SENSIPAR) 90 MG tablet Take 90 mg by mouth daily.    metoprolol succinate (TOPROL-XL) 50 MG 24 hr tablet Take 50 mg by mouth daily. Take with or immediately following a meal.    ondansetron (ZOFRAN) 4 MG tablet Take 4 mg by mouth every 8 (eight) hours as needed.    zolpidem (AMBIEN CR) 6.25 MG CR tablet Take 6.25 mg by mouth at bedtime as needed for sleep.      STOP taking these medications     levofloxacin (LEVAQUIN) 250 MG tablet      oxyCODONE-acetaminophen (ROXICET) 5-325 MG per  tablet          DISCHARGE INSTRUCTIONS:    Full liquid diet Resume your outpt HD as before  If you experience worsening of your admission symptoms, develop shortness of breath, life threatening emergency, suicidal or homicidal thoughts you must seek medical attention immediately by calling 911 or calling your MD immediately  if symptoms less severe.  You Must read complete instructions/literature along with all the possible adverse reactions/side effects for all the Medicines you take and that have been prescribed to you. Take any new Medicines after you have completely understood and accept all the possible adverse reactions/side effects.   Please note  You were cared for by a hospitalist during your hospital stay.  If you have any questions about your discharge medications or the care you received while you were in the hospital after you are discharged, you can call the unit and asked to speak with the hospitalist on call if the hospitalist that took care of you is not available. Once you are discharged, your primary care physician will handle any further medical issues. Please note that NO REFILLS for any discharge medications will be authorized once you are discharged, as it is imperative that you return to your primary care physician (or establish a relationship with a primary care physician if you do not have one) for your aftercare needs so that they can reassess your need for medications and monitor your lab values.    Today   SUBJECTIVE   Mild abd pain. No vomiting. Tolerating po diet   VITAL SIGNS:  Blood pressure 113/67, pulse 82, temperature 98.9 F (37.2 C), temperature source Oral, resp. rate 20, height 6\' 1"  (1.854 m), weight 113.082 kg (249 lb 4.8 oz), SpO2 95 %.  I/O:   Intake/Output Summary (Last 24 hours) at 04/26/15 0812 Last data filed at 04/26/15 0400  Gross per 24 hour  Intake    980 ml  Output      0 ml  Net    980 ml    PHYSICAL EXAMINATION:  GENERAL:  54 y.o.-year-old patient lying in the bed with no acute distress.  EYES: Pupils equal, round, reactive to light and accommodation. No scleral icterus. Extraocular muscles intact.  HEENT: Head atraumatic, normocephalic. Oropharynx and nasopharynx clear.  NECK:  Supple, no jugular venous distention. No thyroid enlargement, no tenderness.  LUNGS: Normal breath sounds bilaterally, no wheezing, rales,rhonchi or crepitation. No use of accessory muscles of respiration.  CARDIOVASCULAR: S1, S2 normal. No murmurs, rubs, or gallops.  ABDOMEN: Soft, non-tender, non-distended. Bowel sounds present. No organomegaly or mass.  EXTREMITIES: No pedal edema, cyanosis, or clubbing.  NEUROLOGIC: Cranial nerves II through XII are intact.  Muscle strength 5/5 in all extremities. Sensation intact. Gait not checked.  PSYCHIATRIC: The patient is alert and oriented x 3.  SKIN: No obvious rash, lesion, or ulcer.   DATA REVIEW:   CBC  Recent Labs Lab 04/25/15 0518  WBC 6.1  HGB 10.6*  HCT 34.6*  PLT 187    Chemistries   Recent Labs Lab 04/25/15 0518  NA 140  K 4.8  CL 97*  CO2 28  GLUCOSE 115*  BUN 48*  CREATININE 16.47*  CALCIUM 9.6  AST 25  ALT 34  ALKPHOS 58  BILITOT 0.6    Cardiac Enzymes No results for input(s): TROPONINI in the last 168 hours.  Microbiology Results   Recent Results (from the past 240 hour(s))  Blood Culture (routine x 2)  Status: None   Collection Time: 04/16/15 11:15 AM  Result Value Ref Range Status   Specimen Description BLOOD  Final   Special Requests NONE  Final   Culture NO GROWTH 5 DAYS  Final   Report Status 04/21/2015 FINAL  Final  Blood Culture (routine x 2)     Status: None   Collection Time: 04/16/15 11:25 AM  Result Value Ref Range Status   Specimen Description BLOOD  Final   Special Requests NONE  Final   Culture NO GROWTH 5 DAYS  Final   Report Status 04/21/2015 FINAL  Final    RADIOLOGY:  No results found.     Management plans discussed with the patient, family and they are in agreement.  CODE STATUS:     Code Status Orders        Start     Ordered   04/24/15 2003  Full code   Continuous     04/24/15 2002    Advance Directive Documentation        Most Recent Value   Type of Advance Directive  Living will   Pre-existing out of facility DNR order (yellow form or pink MOST form)     "MOST" Form in Place?        TOTAL TIME TAKING CARE OF THIS PATIENT: 66minutes.    Aramis Zobel M.D on 04/26/2015 at 8:12 AM  Between 7am to 6pm - Pager - [phone] After 6pm go to [email or web address] - password [email or web address] [facility]  [facility]  Office  [phone]  CC: Primary care physician; No PCP Per Patient

## 2015-04-26 NOTE — Discharge Instructions (Signed)
Resume your outpt HD as before Full liquid diet for 3-4 days

## 2015-04-26 NOTE — Progress Notes (Signed)
Paged and spoke with both Drs. Fritzi Mandes and [place] regarding patient - refusing dialysis, states "he doesn't want dialysis two days in a row".  Patient will be discharged home.

## 2015-04-26 NOTE — Progress Notes (Signed)
Patient for discharge today.  Notified Iran Sizer at [employer] of patient discharge who will send required documents.

## 2015-04-29 NOTE — Discharge Summary (Signed)
[place] at [place]   PATIENT NAME: [name] [name]    MR#:  [id number]  DATE OF BIRTH:  1961-06-07  DATE OF ADMISSION:  04/16/2015 ADMITTING PHYSICIAN: Gladstone Lighter, MD  DATE OF DISCHARGE: 04/18/2015  9:25 AM  PRIMARY CARE PHYSICIAN: No PCP Per Patient    ADMISSION DIAGNOSIS:  Community acquired pneumonia [J18.9]  DISCHARGE DIAGNOSIS:  Principal Problem:   Sepsis Active Problems:   Community acquired pneumonia   SECONDARY DIAGNOSIS:   Past Medical History  Diagnosis Date  . Secondary hyperparathyroidism   . Hypertension   . Polysubstance abuse   . Anemia of chronic disease     BL Hgb ~10  . Tobacco abuse   . CHF (congestive heart failure)     pt denies 04/01/14  . Pneumonia     hx of  . End-stage renal disease on hemodialysis     HD m/w/f - right arm graft  . History of blood transfusion   . Pneumonia   . Sleep disturbance   . Renal insufficiency   . Diverticulitis     HOSPITAL COURSE:   54 year old African-American male with past medical history significant for hypertension, end-stage renal disease on Monday Wednesday Friday hemodialysis, congestive heart failure, anemia of chronic disease presents to the hospital secondary to worsening fever chills and cough congestion.   #1 sepsis-likely source pneumonia.  - Cultures negative, Chest xray positive for LLL consolidation - discharge on levaquin - Feeling better at time of discharge. Vitals stable. No respiratory complaints. Afebrile. Did have some diarrhea and abdominal pain on day of admission, resolved at time of discharge and tolerating regular diet.  He is eagar for discharge. Would like to avoid having HD at the hospital if possible.  #2 end-stage renal disease on hemodialysis- - continue with regular HD schedule  #3 hypertension-continue metoprolol  #4 anemia of chronic disease-stable for now. If needed Procrit with dialysis.  #5 DVT  prophylaxis-subcutaneous heparin           DISCHARGE CONDITIONS:   Stable  CONSULTS OBTAINED:  Treatment Team:  Lavonia Dana, MD  DRUG ALLERGIES:   Allergies  Allergen Reactions  . Tape Other (See Comments)    Plastic Tape Causes blister    DISCHARGE MEDICATIONS:   Discharge Medication List as of 04/18/2015  9:02 AM    START taking these medications   Details  levofloxacin (LEVAQUIN) 250 MG tablet Take 1 tablet (250 mg total) by mouth every other day., Starting 04/18/2015, Until Discontinued, Print      CONTINUE these medications which have NOT CHANGED   Details  calcium acetate (PHOSLO) 667 MG capsule Take 2,668 mg by mouth 3 (three) times daily with meals. , Until Discontinued, Historical Med    cinacalcet (SENSIPAR) 90 MG tablet Take 90 mg by mouth daily., Until Discontinued, Historical Med    metoprolol succinate (TOPROL-XL) 50 MG 24 hr tablet Take 50 mg by mouth daily. Take with or immediately following a meal., Until Discontinued, Historical Med    zolpidem (AMBIEN CR) 6.25 MG CR tablet Take 6.25 mg by mouth at bedtime as needed for sleep., Until Discontinued, Historical Med         DISCHARGE INSTRUCTIONS:    See AVS  If you experience worsening of your admission symptoms, develop shortness of breath, life threatening emergency, suicidal or homicidal thoughts you must seek medical attention immediately by calling 911 or calling your MD immediately  if symptoms less severe.  You Must read complete instructions/literature along with all the possible adverse reactions/side effects for all the Medicines you take and that have been prescribed to you. Take any new Medicines after you have completely understood and accept all the possible adverse reactions/side effects.   Please note  You were cared for by a hospitalist during your hospital stay. If you have any questions about your discharge medications or the care you received while you were in the hospital  after you are discharged, you can call the unit and asked to speak with the hospitalist on call if the hospitalist that took care of you is not available. Once you are discharged, your primary care physician will handle any further medical issues. Please note that NO REFILLS for any discharge medications will be authorized once you are discharged, as it is imperative that you return to your primary care physician (or establish a relationship with a primary care physician if you do not have one) for your aftercare needs so that they can reassess your need for medications and monitor your lab values.    Today   CHIEF COMPLAINT:   Chief Complaint  Patient presents with  . Code Sepsis    cough, congestion, nausea and fever since yesterday after leaving dialysis    HISTORY OF PRESENT ILLNESS:  [name] [name] is a 54 y.o. male with a known history of end-stage renal disease on Monday Wednesday Friday hemodialysis, hypertension, anemia of chronic disease, tobacco use disorder, comes from home secondary to worsening fever and chills that started since yesterday. Had dialysis yesterday and as he was about to come off of dialysis he started to have chills and fevers, had cough congestion and not prevent home to see if he would get any better. Overnight he couldn't get comfortable he was Having chills, generalized abdominal pain and some diarrhea. Nauseous and throwing up yesterday but no nausea or vomiting or diarrhea today. In the hospital he is noted to have a fever of 101F, chest x-ray did show possible consolidation in left lower lobe and he is being admitted for pneumonia.  VITAL SIGNS:  Blood pressure 141/98, pulse 78, temperature 98 F (36.7 C), temperature source Oral, resp. rate 18, height 6' (1.829 m), weight 99.791 kg (220 lb), SpO2 99 %.  I/O:  No intake or output data in the 24 hours ending 04/29/15 1047  PHYSICAL EXAMINATION:  GENERAL:  54 y.o.-year-old patient lying in the bed with no  acute distress.  EYES: Pupils equal, round, reactive to light and accommodation. No scleral icterus. Extraocular muscles intact.  HEENT: Head atraumatic, normocephalic. Oropharynx and nasopharynx clear.  NECK:  Supple, no jugular venous distention. No thyroid enlargement, no tenderness.  LUNGS: Normal breath sounds bilaterally, no wheezing, rales,rhonchi or crepitation. No use of accessory muscles of respiration.  CARDIOVASCULAR: S1, S2 normal. No murmurs, rubs, or gallops.  ABDOMEN: Soft, non-tender, non-distended. Bowel sounds present. No organomegaly or mass. No gaurding or rebound/ EXTREMITIES: No pedal edema, cyanosis, or clubbing.  NEUROLOGIC: Cranial nerves II through XII are intact. Muscle strength 5/5 in all extremities. Sensation intact. Gait not checked.  PSYCHIATRIC: The patient is alert and oriented x 3.  SKIN: No obvious rash, lesion, or ulcer.   DATA REVIEW:   CBC  Recent Labs Lab 04/25/15 0518  WBC 6.1  HGB 10.6*  HCT 34.6*  PLT 187    Chemistries   Recent Labs Lab 04/25/15 0518  NA 140  K 4.8  CL 97*  CO2 28  GLUCOSE  115*  BUN 48*  CREATININE 16.47*  CALCIUM 9.6  AST 25  ALT 34  ALKPHOS 58  BILITOT 0.6    Cardiac Enzymes No results for input(s): TROPONINI in the last 168 hours.  Microbiology Results  Results for orders placed or performed during the hospital encounter of 04/16/15  Blood Culture (routine x 2)     Status: None   Collection Time: 04/16/15 11:15 AM  Result Value Ref Range Status   Specimen Description BLOOD  Final   Special Requests NONE  Final   Culture NO GROWTH 5 DAYS  Final   Report Status 04/21/2015 FINAL  Final  Blood Culture (routine x 2)     Status: None   Collection Time: 04/16/15 11:25 AM  Result Value Ref Range Status   Specimen Description BLOOD  Final   Special Requests NONE  Final   Culture NO GROWTH 5 DAYS  Final   Report Status 04/21/2015 FINAL  Final    RADIOLOGY:  No results found.  EKG:   Orders  placed or performed during the hospital encounter of 04/16/15  . ED EKG  . ED EKG  . EKG 12-Lead  . EKG 12-Lead  . EKG      Management plans discussed with the patient, family and they are in agreement.  CODE STATUS: Full  TOTAL TIME TAKING CARE OF THIS PATIENT: 35 minutes.    Myrtis Ser M.D on 04/29/2015 at 10:47 AM  Between 7am to 6pm - Pager - [phone]  After 6pm go to [email or web address] - password [email or web address] [facility]  [facility]  Office  [phone]  CC: Primary care physician; No PCP Per Patient

## 2015-07-21 ENCOUNTER — Ambulatory Visit: Admit: 2015-07-21 | Payer: Medicare Other | Admitting: Vascular Surgery

## 2015-07-21 SURGERY — A/V SHUNTOGRAM/FISTULAGRAM
Anesthesia: Moderate Sedation

## 2015-08-13 IMAGING — CT CT ABD-PELV W/O CM
2 of 4 series · 17 of 46 positions shown, 19 images · non-contrast
Comparison: None.

CLINICAL DATA: Increased abdominal girth.  End-stage renal disease.

EXAM:
CT ABDOMEN AND PELVIS WITHOUT CONTRAST
TECHNIQUE: Multidetector CT imaging of the abdomen and pelvis was performed
following the standard protocol without intravenous contrast.

[Series 2: ap without · axial · non-contrast · 0.79mm/px · z∈[-402,+23]mm · 14 of 93 slices shown, 16 images]
[im 4/93  soft-tissue]
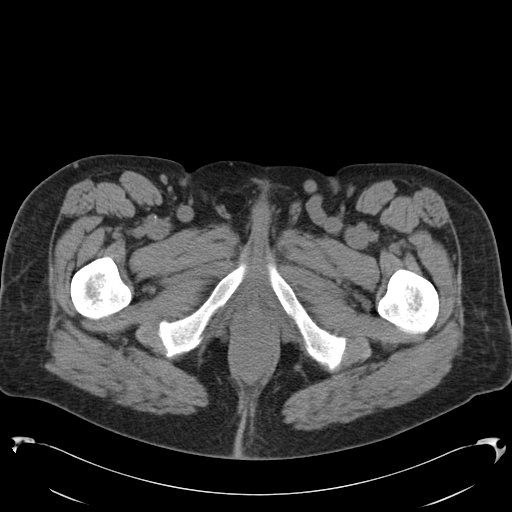
[im 4/93  bone]
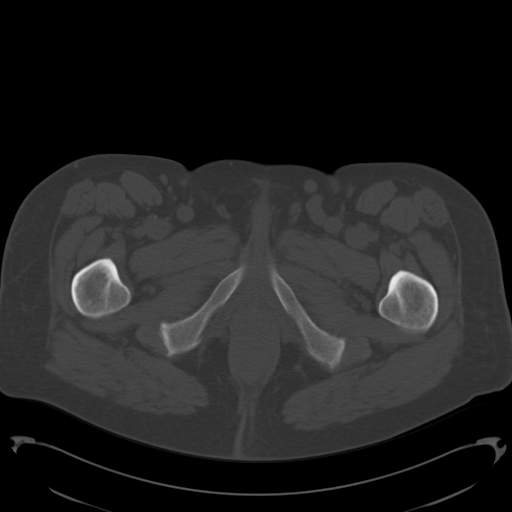
[im 11/93  soft-tissue]
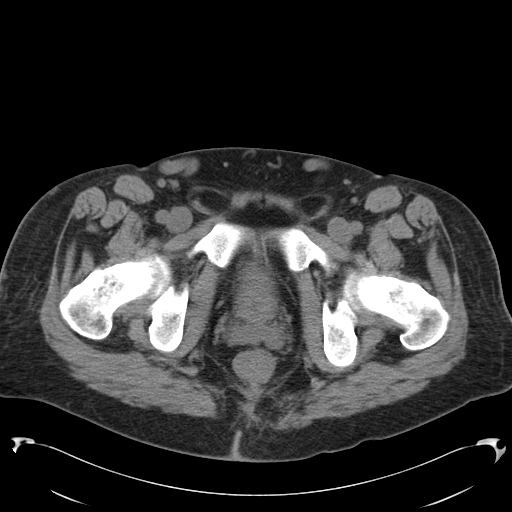
[im 18/93  soft-tissue]
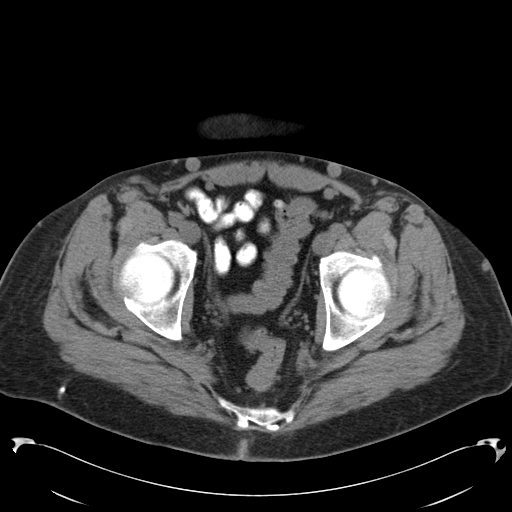
[im 25/93  soft-tissue]
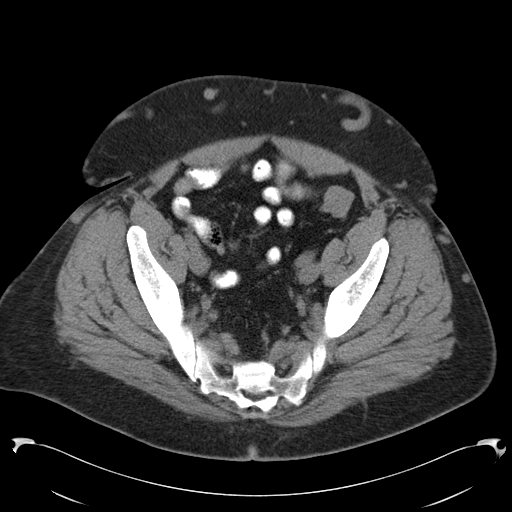
[im 32/93  soft-tissue]
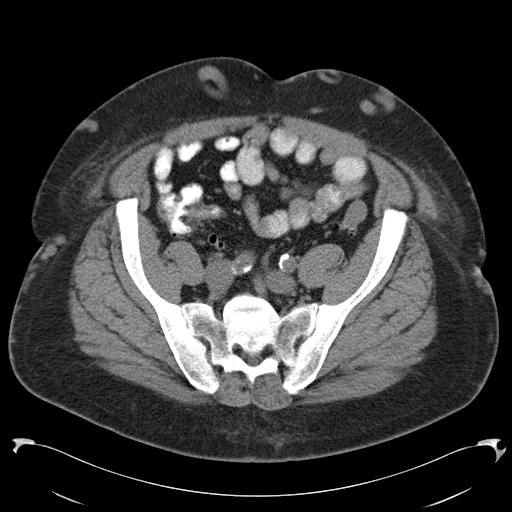
[im 36/93  soft-tissue]
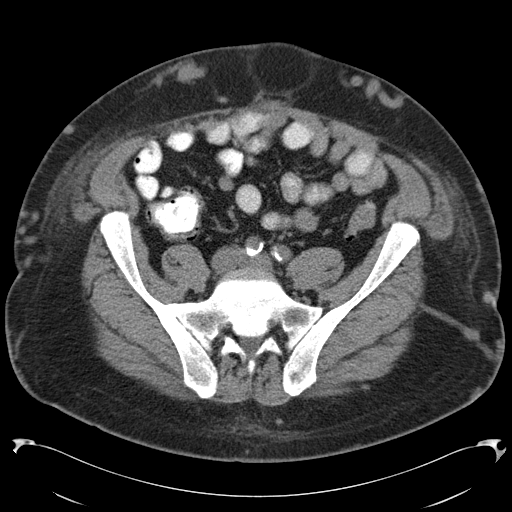
[im 43/93  soft-tissue]
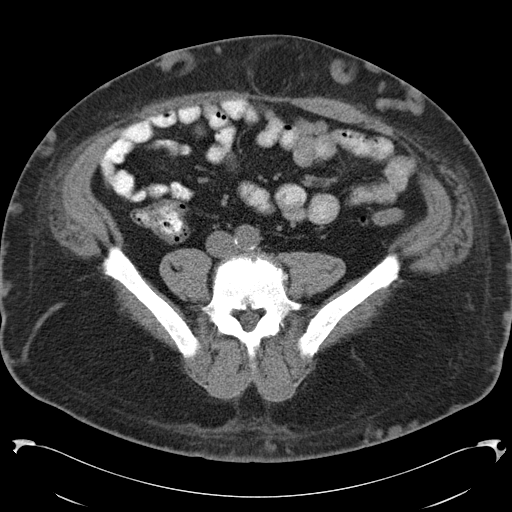
[im 50/93  soft-tissue]
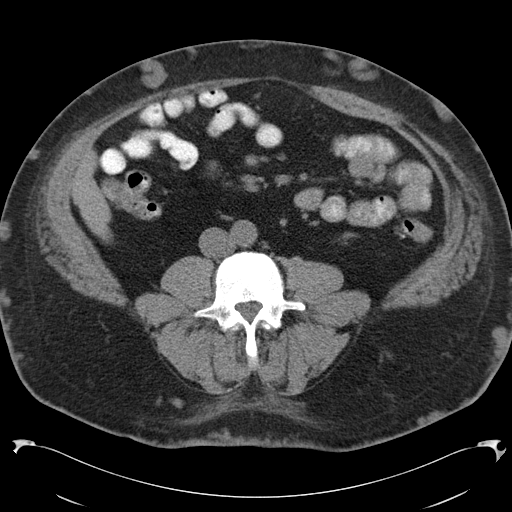
[im 57/93  soft-tissue]
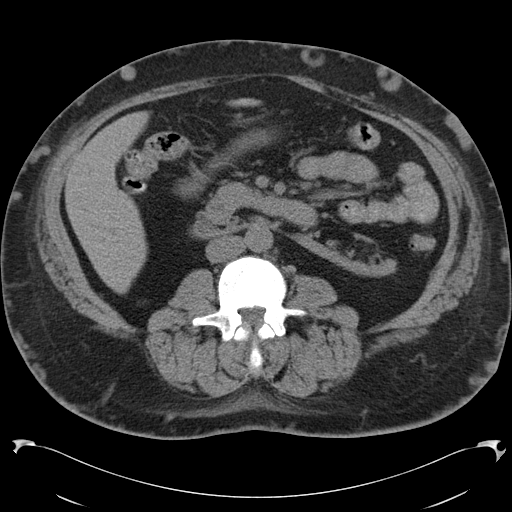
[im 57/93  bone]
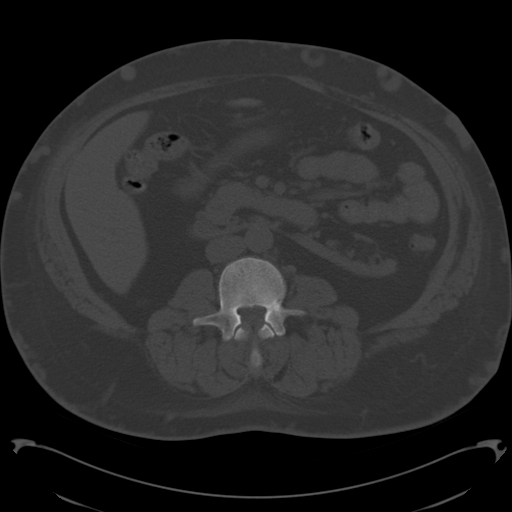
[im 61/93  soft-tissue]
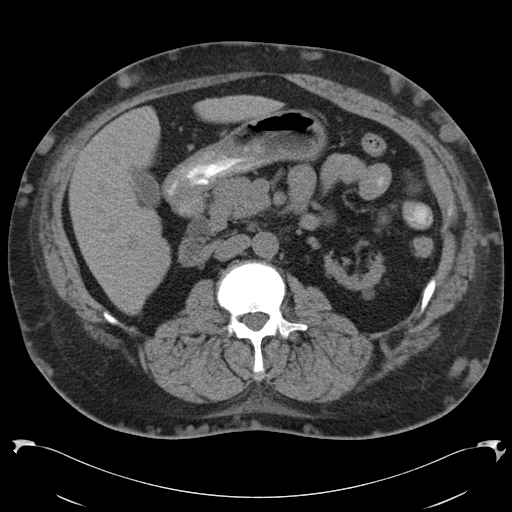
[im 68/93  soft-tissue]
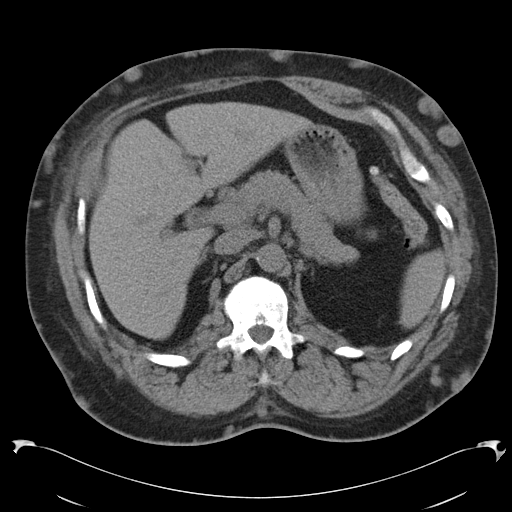
[im 75/93  soft-tissue]
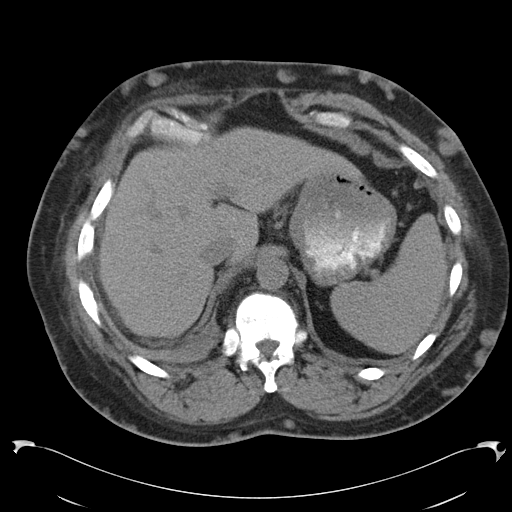
[im 82/93  soft-tissue]
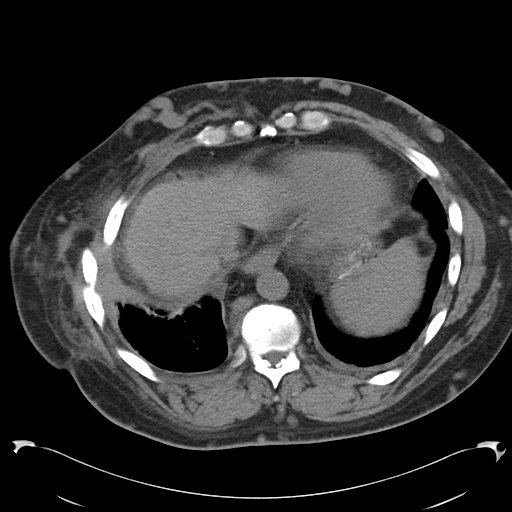
[im 89/93  soft-tissue]
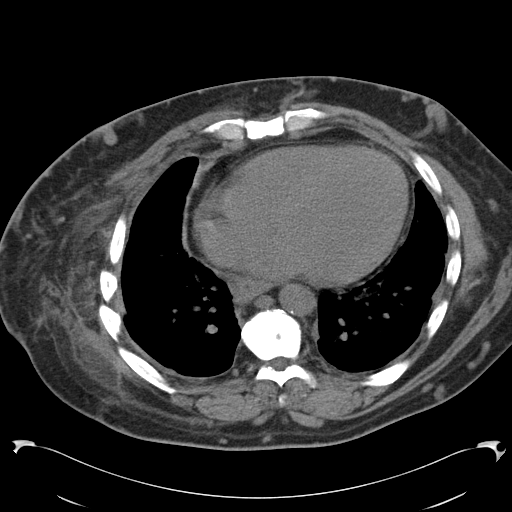

[Series 602: cor · coronal · 0.94mm/px · 3 of 157 slices shown]
[im 53/157  soft-tissue]
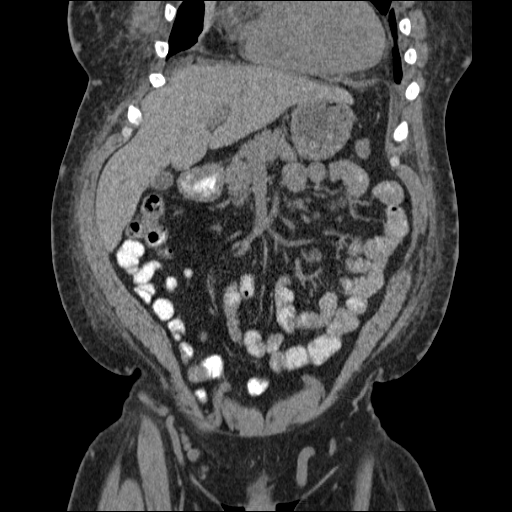
[im 70/157  soft-tissue]
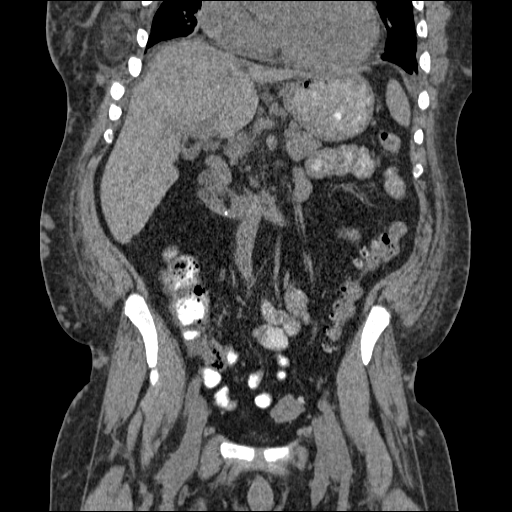
[im 87/157  soft-tissue]
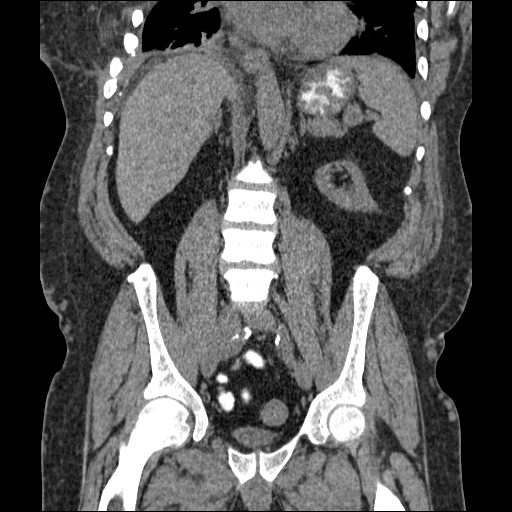

[17 of 46 positions shown; findings below may reference images not displayed]

FINDINGS: Mild bibasilar pleural thickening or tiny bilateral pleural
effusions noted. No evidence of ascites. Multiple abdominal and
pelvic wall varices are noted.

Noncontrast images of the liver, gallbladder, pancreas, spleen,
adrenal glands are normal in appearance.

A congenital horseshoe kidney is seen which shows diffuse renal
parenchymal atrophy. 2 mm nonobstructing calculus is seen in the
right side of the horseshoe kidney. No evidence of ureteral calculi
or hydronephrosis.

Diverticulosis is seen involving the descending and sigmoid portions
of the colon, however there is no evidence of diverticulitis. No
other inflammatory process or abnormal fluid collections are
identified. No definite soft tissue masses or lymphadenopathy
identified. A paraumbilical hernia is seen containing only fat,
however there is no evidence of herniated bowel. No suspicious bone
lesions identified.
IMPRESSION: Diverticulosis. No radiographic evidence of diverticulitis or other
acute findings.

Small paraumbilical hernia containing only fat. No evidence of
herniated bowel loops.

Horseshoe kidney noted with diffuse parenchymal atrophy and tiny
nonobstructive right-sided renal calculus. No evidence of ureteral
calculi or hydronephrosis.

Tiny bibasilar pleural effusions versus pleural thickening.
Extensive varices within the abdominal and pelvic wall subcutaneous
tissues.

## 2016-08-22 IMAGING — CR DG CHEST 2V
1 series · 2 of 2 positions shown · non-contrast
Comparison: Prior chest x-ray 08/18/2013

CLINICAL DATA: 53-year-old male with fever, weakness, shortness of
breath and nausea beginning yesterday after leaving dialysis.
Medical history includes a previous loculated right pleural effusion
which was treated with video-assisted thoracoscopic surgery and
decortication. This procedure was complicated by persistent air leak
and bronchopleural fistula requiring several procedures including
repeat thoracoscopic surgery and suture repair as well as talc
pleurodesis.

EXAM:
CHEST  2 VIEW

[Series 1: dg chest 2 view · 0.14mm/px · 2 of 2 slices shown]
[im 1/2]
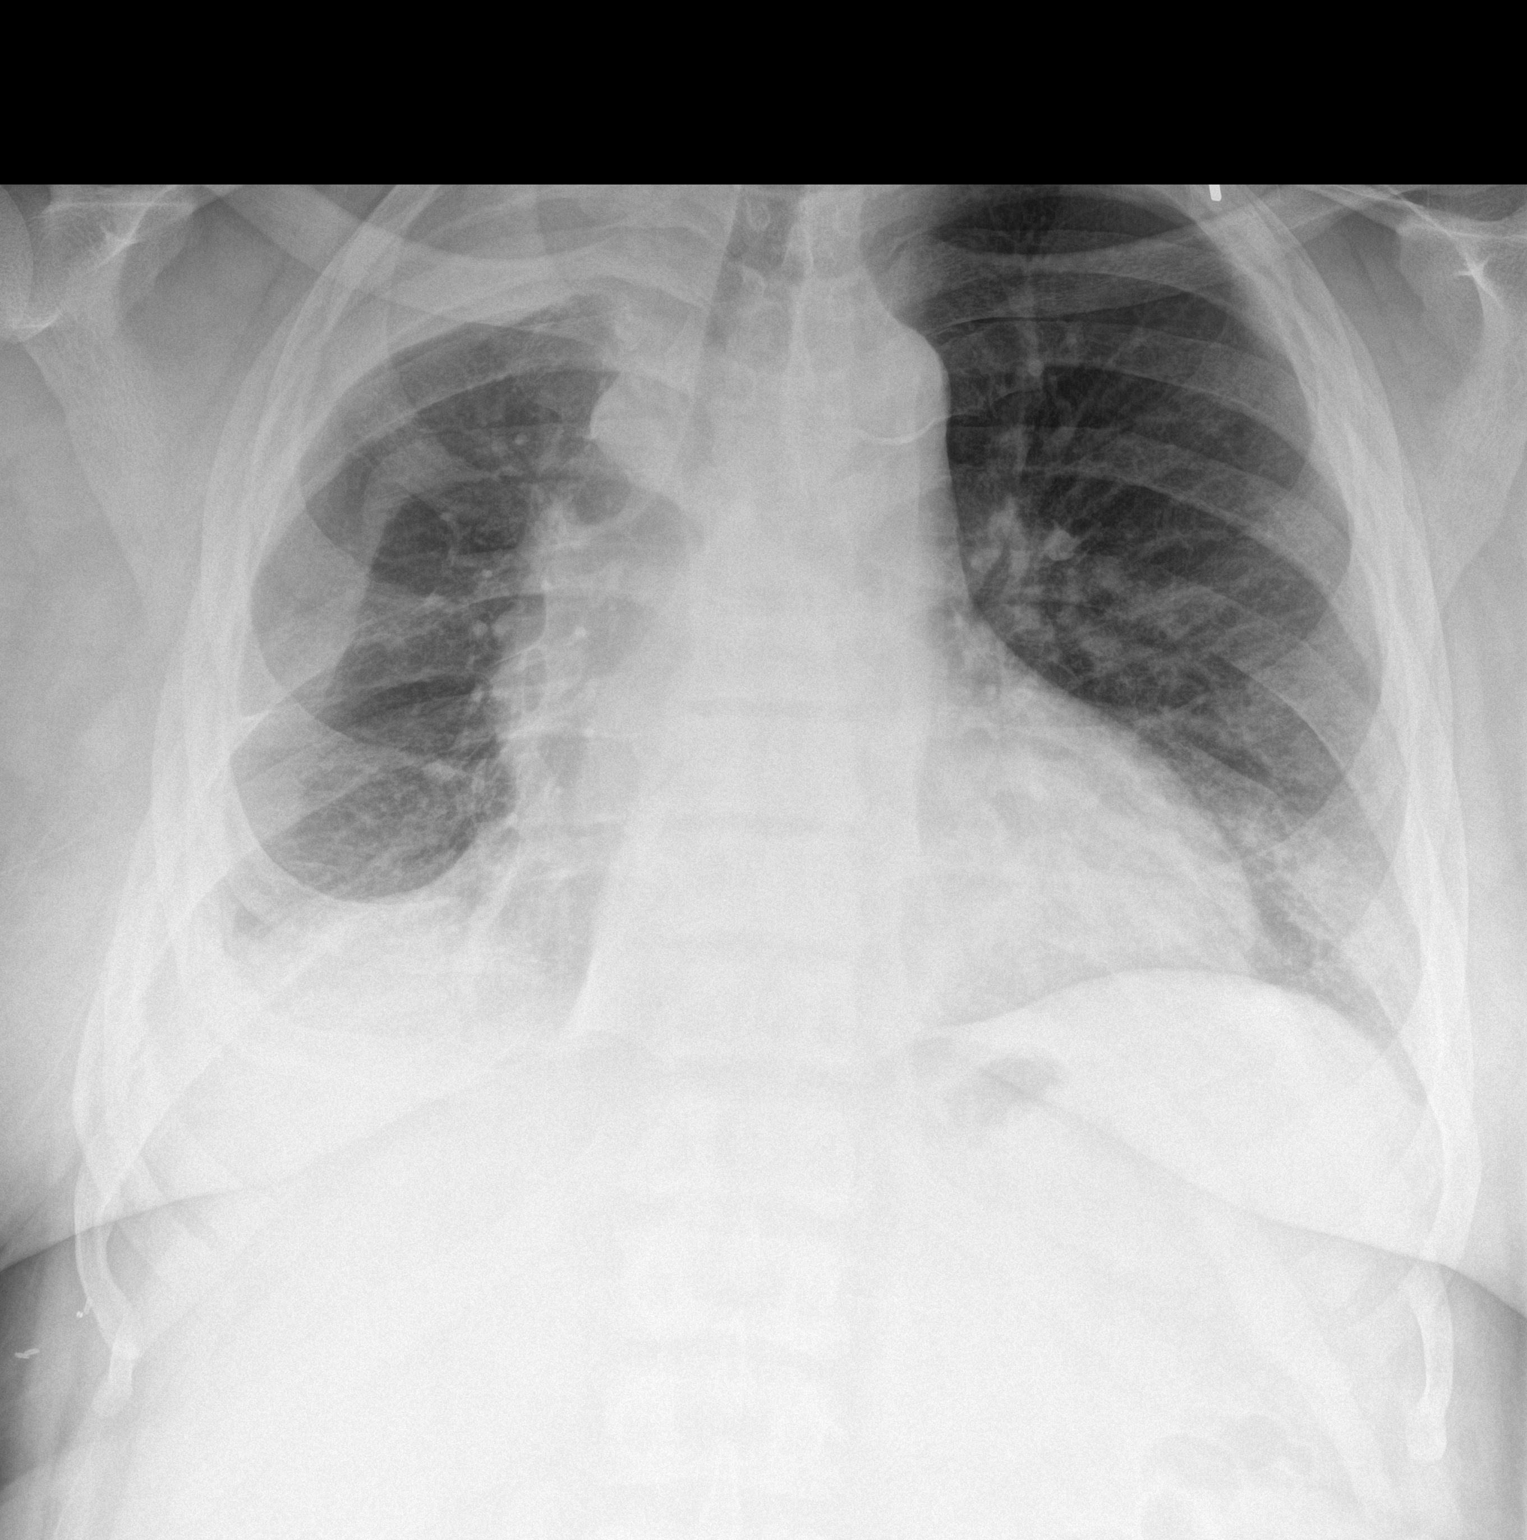
[im 2/2]
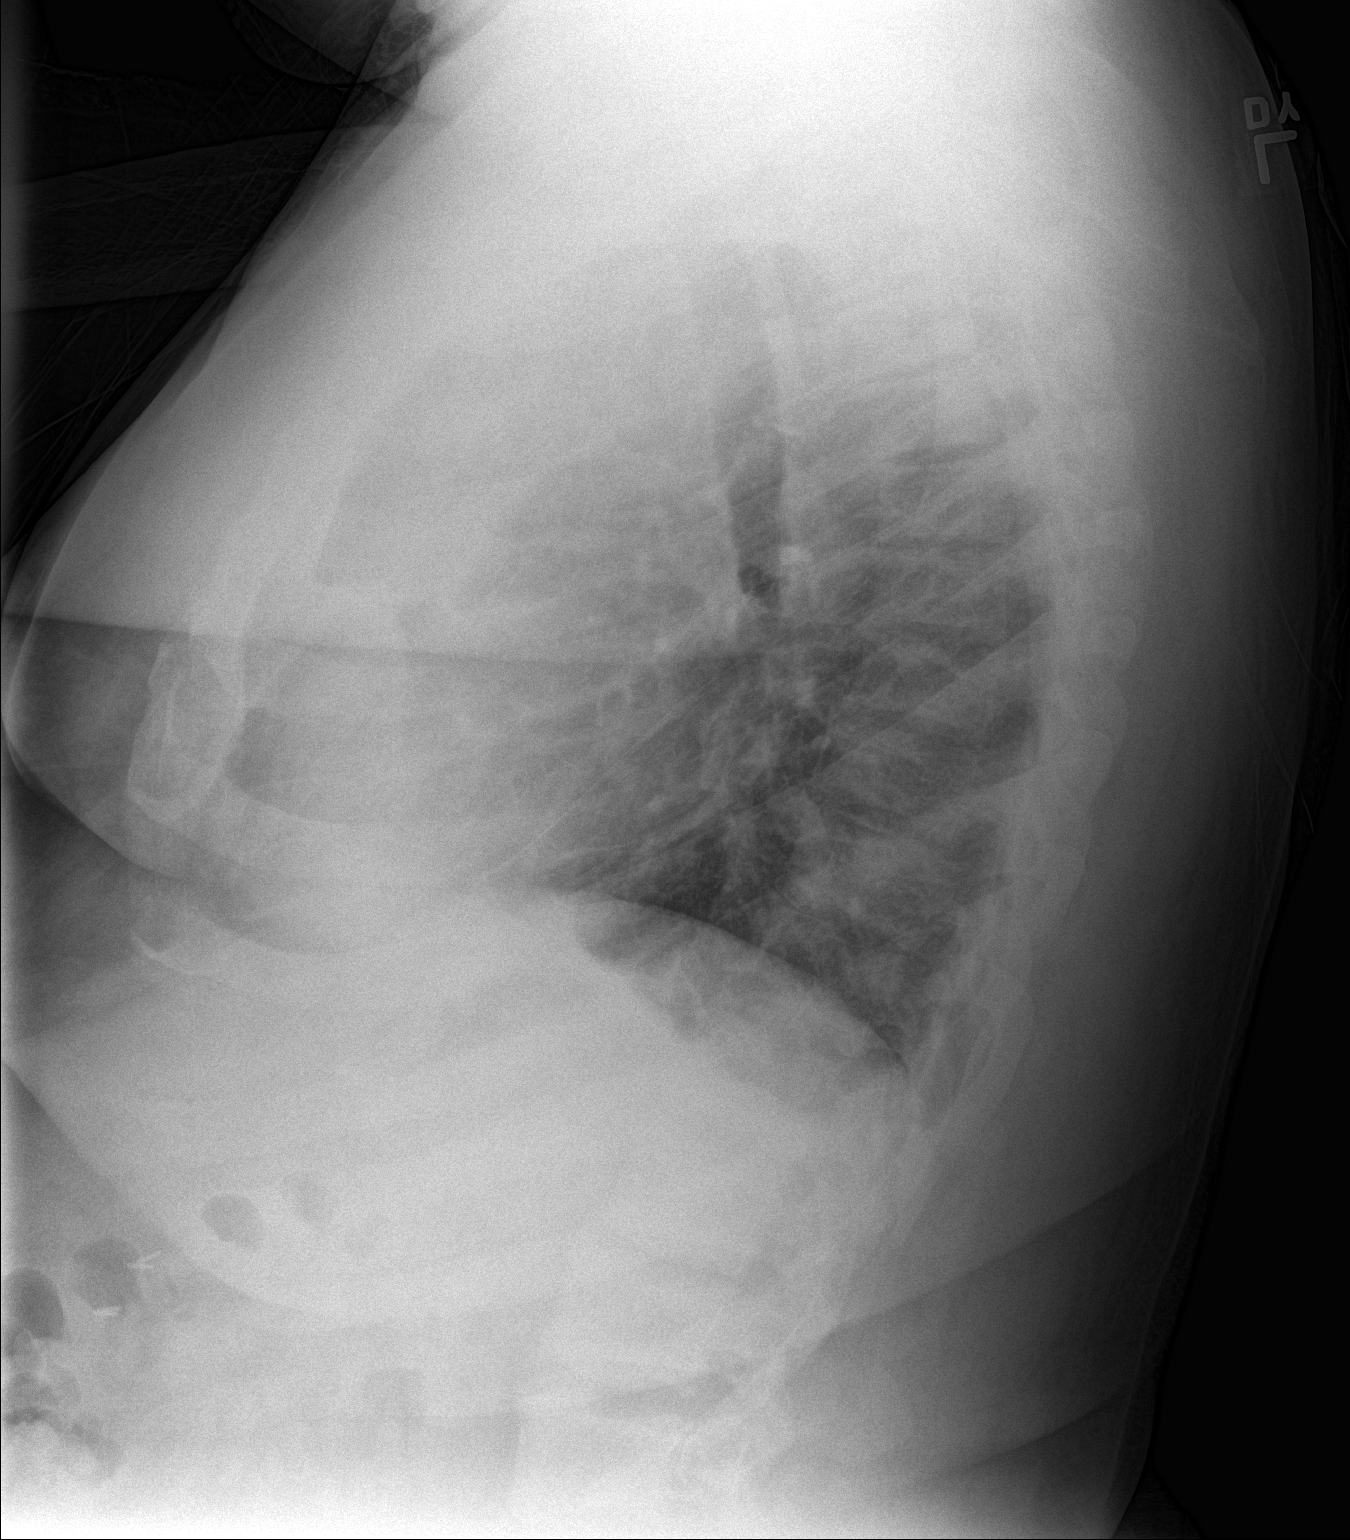

[2 of 2 positions shown; findings below may reference images not displayed]

FINDINGS: Stable mild cardiomegaly. Atherosclerotic calcifications present in
the transverse aorta. The appearance of the right lung is similar
compared to prior with chronic pleural thickening at the lung apex
and at the lung base. There is no evidence of air in the right apex
as seen on previous imaging suggesting resolution of the previously
described bronchopleural fistula. There may be some minimal patchy
airspace opacity in the left base on the frontal view. Mild
pulmonary vascular congestion without overt edema similar compared
to prior. No acute osseous abnormality.
IMPRESSION: 1. Perhaps mild patchy airspace opacity in the left base on the
frontal view which may reflect atelectasis, residual scarring from
prior disease, or early pneumonia.
2. Unchanged appearance of the right chest with residual pleural
thickening versus loculated pleural effusion. No evidence of hydro
pneumothorax to suggest recurrent bronchopleural fistula.

## 2016-08-28 IMAGING — CT CT ABD-PELV W/ CM
1 of 3 series · 13 of 32 positions shown, 18 images · IV contrast (omnipaque)
Comparison: 04/06/2014

CLINICAL DATA: Abdominal pain starting today.  Vomiting.

EXAM:
CT ABDOMEN AND PELVIS WITH CONTRAST
TECHNIQUE: Multidetector CT imaging of the abdomen and pelvis was performed
using the standard protocol following bolus administration of
intravenous contrast.
CONTRAST:  100mL OMNIPAQUE IOHEXOL 300 MG/ML  SOLN

[Series 2: routine abd pel with · axial · 0.88mm/px · z∈[-496,-61]mm · 13 of 97 slices shown, 18 images]
[im 5/97  soft-tissue]
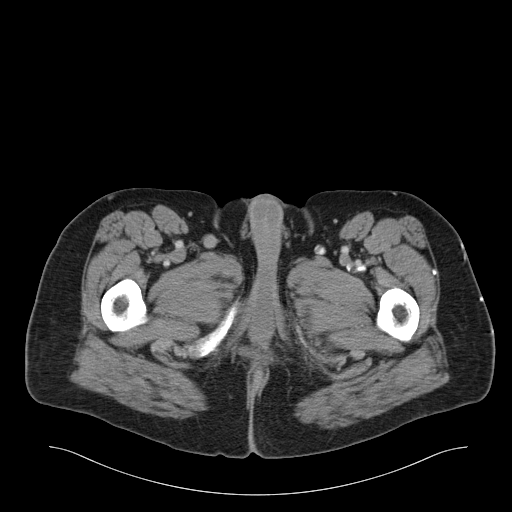
[im 5/97  bone]
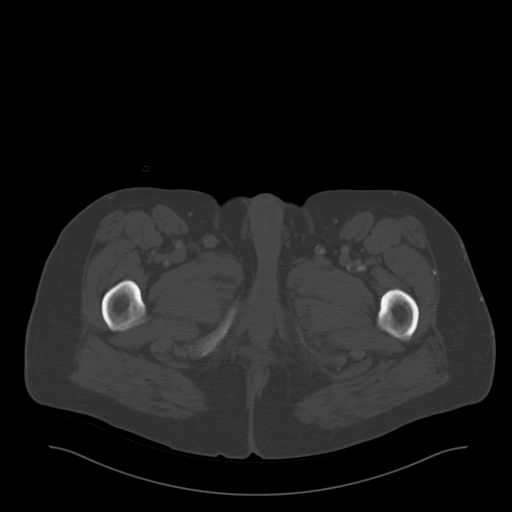
[im 15/97  soft-tissue]
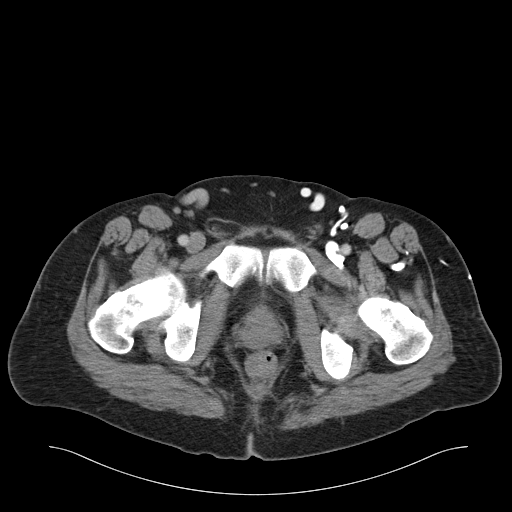
[im 20/97  soft-tissue]
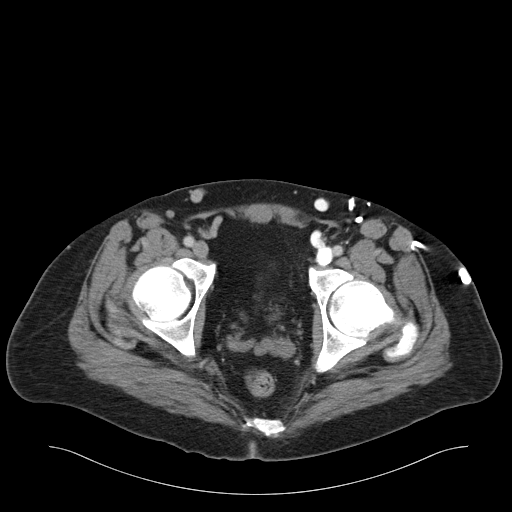
[im 29/97  soft-tissue]
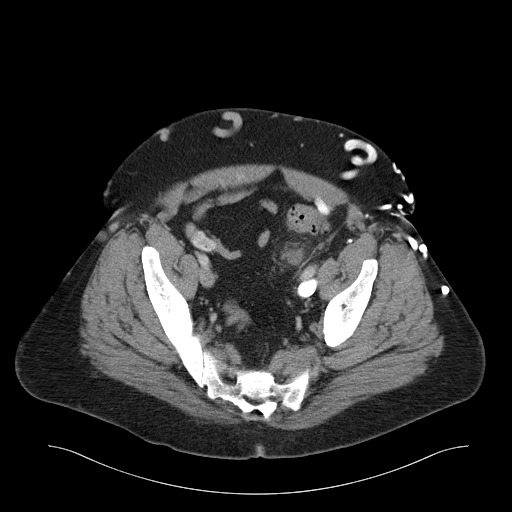
[im 39/97  soft-tissue]
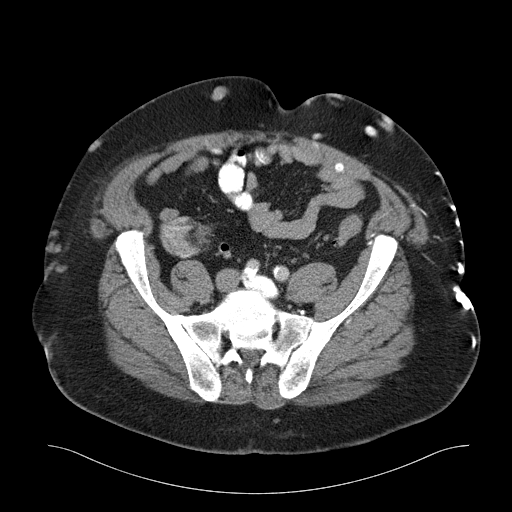
[im 44/97  soft-tissue]
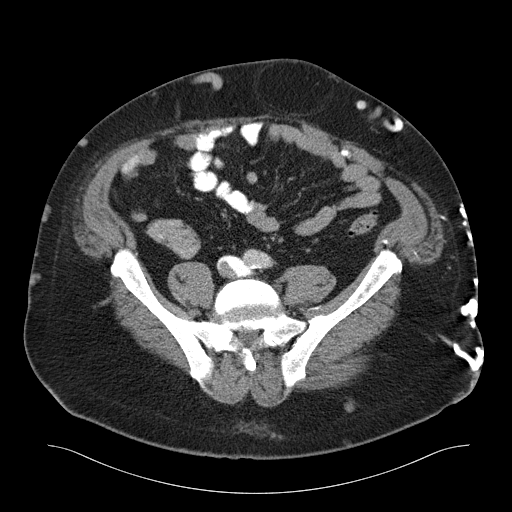
[im 53/97  soft-tissue]
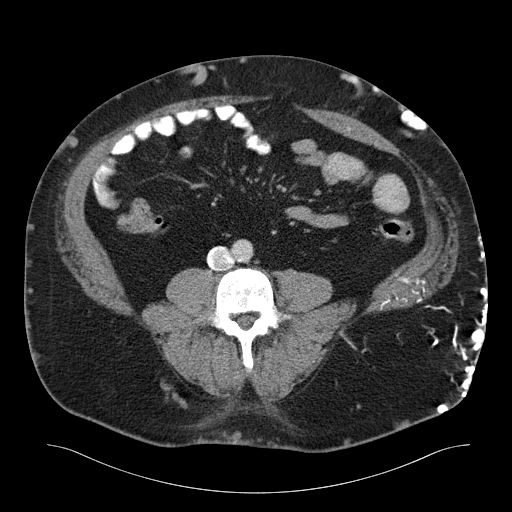
[im 58/97  soft-tissue]
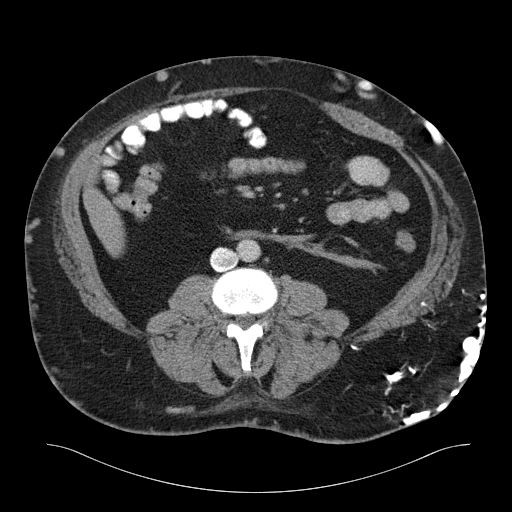
[im 68/97  soft-tissue]
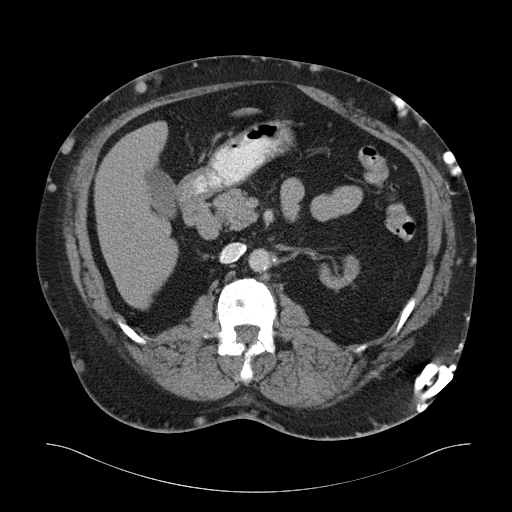
[im 68/97  bone]
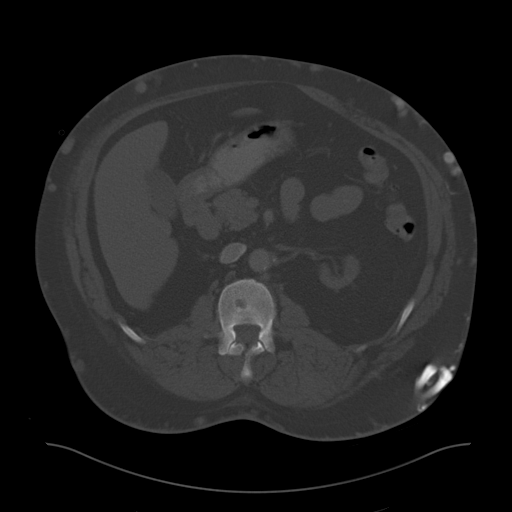
[im 77/97  soft-tissue]
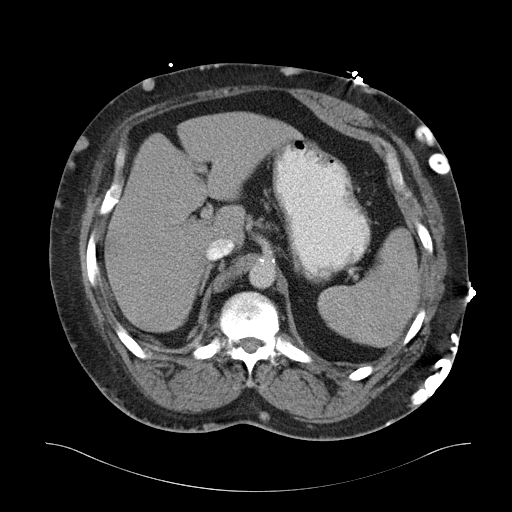
[im 77/97  lung]
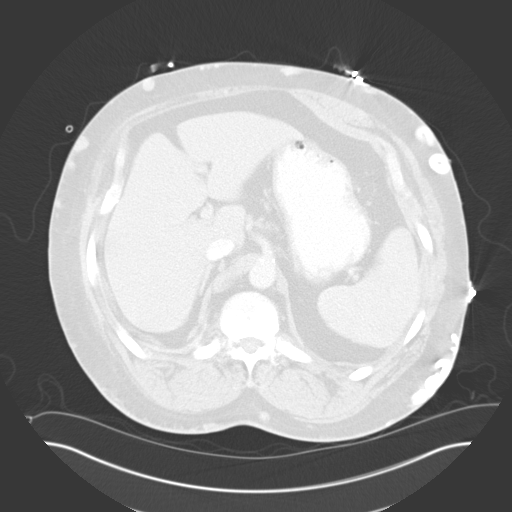
[im 82/97  soft-tissue]
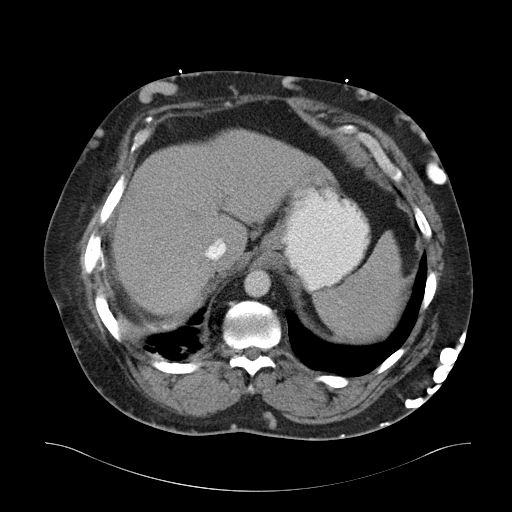
[im 82/97  lung]
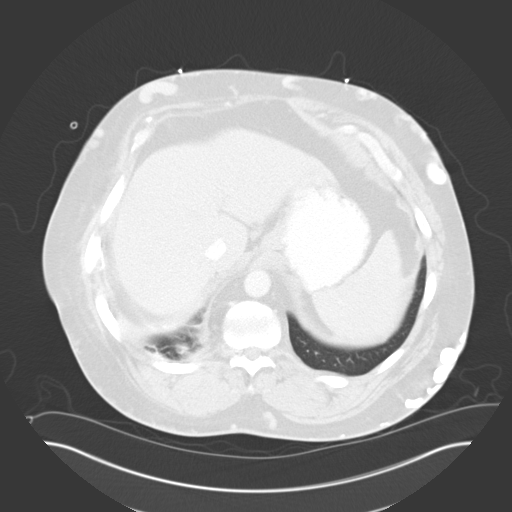
[im 87/97  lung]
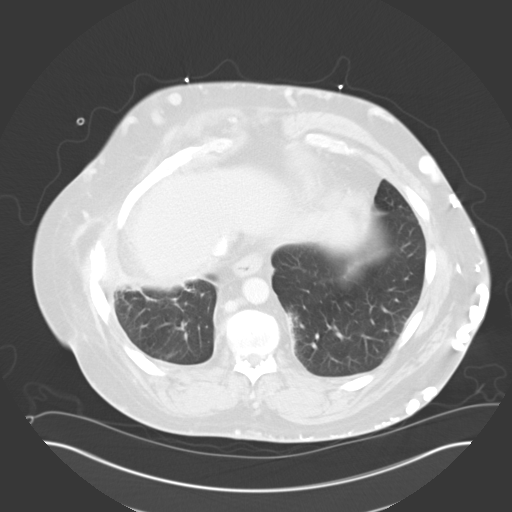
[im 92/97  soft-tissue]
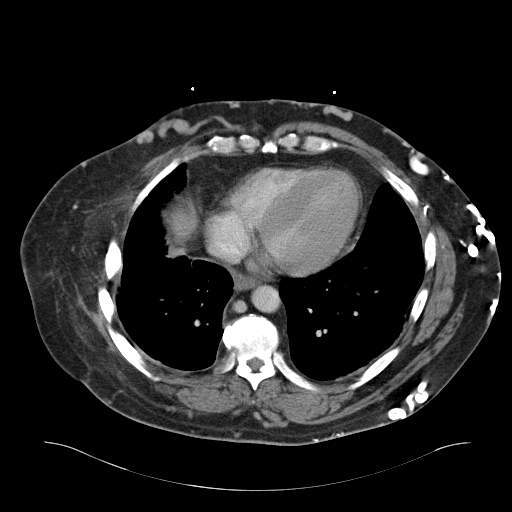
[im 92/97  lung]
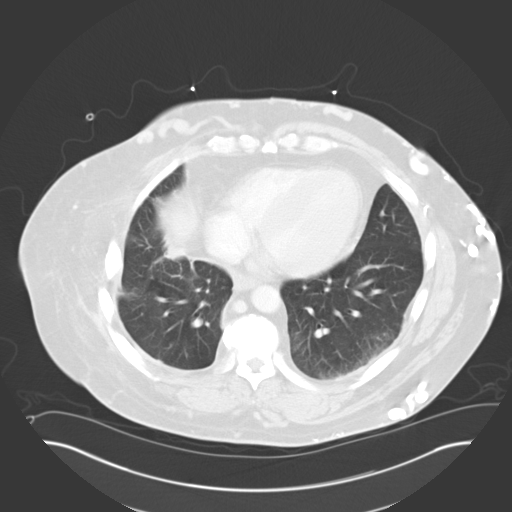

[13 of 32 positions shown; findings below may reference images not displayed]

FINDINGS: Dependent atelectasis and scarring in the lung bases similar to
prior study.

The liver, spleen, gallbladder, pancreas, adrenal glands, abdominal
aorta, inferior vena cava, and retroperitoneal lymph nodes are
unremarkable. There are extensive venous varices demonstrated
throughout the abdominal wall and the pelvis with venous collaterals
extending to the left groin region. These appear to be receiving
contrast material from the chest. Findings suggest possible SVC
obstruction. Filling defect in the inferior vena cava is consistent
with mixing of non-opacified blood. This appearance is unchanged
since prior study. Horseshoe kidneys are atrophic with multiple
renal cysts. No hydronephrosis in either kidney. Small
nonobstructing stone in the right kidney is unchanged. The stomach,
small bowel, and colon are not abnormally distended. No free air or
free fluid in the abdomen. Moderate-sized periumbilical hernia
containing fat. No infiltration.

Pelvis: The appendix is normal. Diverticulosis of the sigmoid colon
with minimal infiltration in the pericolonic fat, new since prior
study, suggesting acute diverticulitis. No abscess. Bladder is
decompressed. Prostate gland is not enlarged. No free or loculated
pelvic fluid collections. No pelvic mass or lymphadenopathy. Mild
diffuse sclerosis demonstrated in the bones is nonspecific but could
indicate renal osteodystrophy. No destructive bone lesions.
IMPRESSION: 1. Changes of acute diverticulitis.  No abscess.
2. Chronic finding of multiple venous collaterals throughout the
lower chest and abdominal wall. This raises suspicion of SVC
obstruction. No change since prior study.
3. Periumbilical hernia containing fat.  No change.
4. Horseshoe kidney with diffuse renal atrophy. Nonobstructing stone
in the right kidney. Possible renal osteodystrophy.

## 2017-01-20 ENCOUNTER — Encounter: Payer: Self-pay | Admitting: Emergency Medicine

## 2017-01-20 ENCOUNTER — Emergency Department: Payer: Medicare Other

## 2017-01-20 ENCOUNTER — Emergency Department
Admission: EM | Admit: 2017-01-20 | Discharge: 2017-01-20 | Disposition: A | Discharge status: Home / Self Care | Payer: Medicare Other | Attending: Emergency Medicine | Admitting: Emergency Medicine

## 2017-01-20 DIAGNOSIS — K5732 Diverticulitis of large intestine without perforation or abscess without bleeding: Secondary | ICD-10-CM | POA: Insufficient documentation

## 2017-01-20 DIAGNOSIS — I132 Hypertensive heart and chronic kidney disease with heart failure and with stage 5 chronic kidney disease, or end stage renal disease: Secondary | ICD-10-CM | POA: Diagnosis not present

## 2017-01-20 DIAGNOSIS — Z79899 Other long term (current) drug therapy: Secondary | ICD-10-CM | POA: Insufficient documentation

## 2017-01-20 DIAGNOSIS — I509 Heart failure, unspecified: Secondary | ICD-10-CM | POA: Insufficient documentation

## 2017-01-20 DIAGNOSIS — Z992 Dependence on renal dialysis: Secondary | ICD-10-CM | POA: Diagnosis not present

## 2017-01-20 DIAGNOSIS — F17210 Nicotine dependence, cigarettes, uncomplicated: Secondary | ICD-10-CM | POA: Insufficient documentation

## 2017-01-20 DIAGNOSIS — N186 End stage renal disease: Secondary | ICD-10-CM | POA: Insufficient documentation

## 2017-01-20 DIAGNOSIS — R109 Unspecified abdominal pain: Secondary | ICD-10-CM | POA: Diagnosis present

## 2017-01-20 LAB — CBC
HEMATOCRIT: 40.8 % (ref 40.0–52.0)
Hemoglobin: 13.1 g/dL (ref 13.0–18.0)
MCH: 30.7 pg (ref 26.0–34.0)
MCHC: 32.2 g/dL (ref 32.0–36.0)
MCV: 95.4 fL (ref 80.0–100.0)
Platelets: 121 10*3/uL — ABNORMAL LOW (ref 150–440)
RBC: 4.27 MIL/uL — ABNORMAL LOW (ref 4.40–5.90)
RDW: 14.5 % (ref 11.5–14.5)
WBC: 5 10*3/uL (ref 3.8–10.6)

## 2017-01-20 LAB — COMPREHENSIVE METABOLIC PANEL
ALBUMIN: 4.3 g/dL (ref 3.5–5.0)
ALK PHOS: 64 U/L (ref 38–126)
ALT: 9 U/L — ABNORMAL LOW (ref 17–63)
AST: 15 U/L (ref 15–41)
Anion gap: 18 — ABNORMAL HIGH (ref 5–15)
BILIRUBIN TOTAL: 0.8 mg/dL (ref 0.3–1.2)
BUN: 62 mg/dL — AB (ref 6–20)
CALCIUM: 9.7 mg/dL (ref 8.9–10.3)
CO2: 27 mmol/L (ref 22–32)
Chloride: 95 mmol/L — ABNORMAL LOW (ref 101–111)
Creatinine, Ser: 15.43 mg/dL — ABNORMAL HIGH (ref 0.61–1.24)
GFR calc Af Amer: 4 mL/min — ABNORMAL LOW (ref >60)
GFR calc non Af Amer: 3 mL/min — ABNORMAL LOW (ref >60)
GLUCOSE: 104 mg/dL — AB (ref 65–99)
Potassium: 4.3 mmol/L (ref 3.5–5.1)
SODIUM: 140 mmol/L (ref 135–145)
Total Protein: 8.4 g/dL — ABNORMAL HIGH (ref 6.5–8.1)

## 2017-01-20 LAB — LIPASE, BLOOD: Lipase: 82 U/L — ABNORMAL HIGH (ref 11–51)

## 2017-01-20 MED ORDER — OXYCODONE-ACETAMINOPHEN 5-325 MG PO TABS
1.0000 | ORAL_TABLET | ORAL | Status: DC | PRN
  Administered 2017-01-20: 1 via ORAL
  Filled 2017-01-20: qty 1

## 2017-01-20 MED ORDER — IOPAMIDOL (ISOVUE-370) INJECTION 76%
100.0000 mL | Freq: Once | INTRAVENOUS | Status: AC | PRN
  Administered 2017-01-20: 100 mL via INTRAVENOUS

## 2017-01-20 MED ORDER — ONDANSETRON 4 MG PO TBDP
4.0000 mg | ORAL_TABLET | Freq: Once | ORAL | Status: AC | PRN
  Administered 2017-01-20: 4 mg via ORAL
  Filled 2017-01-20: qty 1

## 2017-01-20 MED ORDER — ONDANSETRON HCL 4 MG/2ML IJ SOLN
INTRAMUSCULAR | Status: AC
  Administered 2017-01-20: 4 mg via INTRAVENOUS
  Filled 2017-01-20: qty 2

## 2017-01-20 MED ORDER — OXYCODONE-ACETAMINOPHEN 5-325 MG PO TABS: 1.0000 | ORAL_TABLET | ORAL | 0 refills | Status: DC | PRN

## 2017-01-20 MED ORDER — ONDANSETRON HCL 4 MG PO TABS: 4.0000 mg | ORAL_TABLET | Freq: Every day | ORAL | 0 refills | Status: DC | PRN

## 2017-01-20 MED ORDER — PROMETHAZINE HCL 25 MG/ML IJ SOLN
12.5000 mg | Freq: Once | INTRAMUSCULAR | Status: AC
  Administered 2017-01-20: 12.5 mg via INTRAVENOUS

## 2017-01-20 MED ORDER — AMOXICILLIN-POT CLAVULANATE 875-125 MG PO TABS
1.0000 | ORAL_TABLET | Freq: Once | ORAL | Status: AC
  Administered 2017-01-20: 1 via ORAL
  Filled 2017-01-20: qty 1

## 2017-01-20 MED ORDER — MORPHINE SULFATE (PF) 4 MG/ML IV SOLN
INTRAVENOUS | Status: AC
  Administered 2017-01-20: 4 mg via INTRAVENOUS
  Filled 2017-01-20: qty 1

## 2017-01-20 MED ORDER — MORPHINE SULFATE (PF) 4 MG/ML IV SOLN
4.0000 mg | Freq: Once | INTRAVENOUS | Status: AC
  Administered 2017-01-20: 4 mg via INTRAVENOUS

## 2017-01-20 MED ORDER — PROMETHAZINE HCL 25 MG/ML IJ SOLN
INTRAMUSCULAR | Status: AC
  Administered 2017-01-20: 12.5 mg via INTRAVENOUS
  Filled 2017-01-20: qty 1

## 2017-01-20 MED ORDER — AMOXICILLIN-POT CLAVULANATE 875-125 MG PO TABS: 1.0000 | ORAL_TABLET | Freq: Two times a day (BID) | ORAL | 0 refills | Status: AC

## 2017-01-20 MED ORDER — ONDANSETRON HCL 4 MG/2ML IJ SOLN
4.0000 mg | Freq: Once | INTRAMUSCULAR | Status: AC
  Administered 2017-01-20: 4 mg via INTRAVENOUS

## 2017-01-20 NOTE — ED Notes (Signed)
Patient c/o nausea, began to dry heave after morphine administration. RN holding augmentin until nausea resolves.

## 2017-01-20 NOTE — ED Notes (Signed)
Pt states he is feeling better, nausea has resolved after medication.

## 2017-01-20 NOTE — ED Notes (Signed)
Patient continues to c/o nausea

## 2017-01-20 NOTE — ED Provider Notes (Signed)
[facility] Emergency Department Provider Note _   First MD Initiated Contact with Patient 01/20/17 [phone]     (approximate)  I have reviewed the triage vital signs and the nursing notes.   HISTORY  Chief Complaint Abdominal Pain    HPI [name] [name] is a 56 y.o. male presents with 2 day history of 10 out of 10 LLQ abdominal pain and nausea. History of Diverticulitis which patient states presented in a similar fashion. Patient denies any fever. Patient denies any diarrhea or constipation.   Past Medical History:  Diagnosis Date  . Anemia of chronic disease    BL Hgb ~10  . CHF (congestive heart failure) ([place])    pt denies 04/01/14  . Diverticulitis   . End-stage renal disease on hemodialysis ([place])    HD m/w/f - right arm graft  . History of blood transfusion   . Hypertension   . Pneumonia    hx of  . Pneumonia   . Polysubstance abuse   . Renal insufficiency   . Secondary hyperparathyroidism ([place])   . Sleep disturbance   . Tobacco abuse     Patient Active Problem List   Diagnosis Date Noted  . Diverticulitis of colon 04/24/2015  . Community acquired pneumonia 04/18/2015  . Sepsis ([place]) 04/16/2015  . S/P thoracotomy 03/04/2013  . 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 Bacteremia 01/06/2013  . Benign neoplasm of colon 12/16/2012  . Diverticulosis of colon (without mention of hemorrhage) 12/16/2012  . ESRD (end stage renal disease) ([place]) 12/12/2012  . H/O: hypertension 12/12/2012  . Pleural effusion, right 12/12/2012  . Hyperkalemia 12/11/2012  . Acute blood loss anemia 12/11/2012  . Sinus tachycardia, resolved with PRBC resuscitation 12/11/2012  . Lower GI bleed 12/11/2012  . End-stage renal disease on hemodialysis ([place])   . Secondary hyperparathyroidism ([place])   . Hypertension   . Polysubstance abuse   . Anemia of chronic disease   . Tobacco abuse     Past Surgical History:  Procedure Laterality Date  . APPENDECTOMY     . CHEST TUBE INSERTION  02/2013  . COLONOSCOPY  12/16/2012   Procedure: COLONOSCOPY;  Surgeon: Milus Banister, MD;  Location: [facility];  Service: Endoscopy;  Laterality: N/A;  . PLEURAL EFFUSION DRAINAGE  12/18/2012   Procedure: DRAINAGE OF PLEURAL EFFUSION;  Surgeon: Melrose Nakayama, MD;  Location: [place];  Service: Thoracic;  Laterality: Right;  . surgery for horseshoe kidney    . TALC PLEURODESIS Right 02/05/2013   Procedure: Pietro Cassis;  Surgeon: Melrose Nakayama, MD;  Location: [place];  Service: Thoracic;  Laterality: Right;  . TEE WITHOUT CARDIOVERSION N/A 01/16/2013   Procedure: TRANSESOPHAGEAL ECHOCARDIOGRAM (TEE);  Surgeon: Sanda Klein, MD;  Location: [facility] ENDOSCOPY;  Service: Cardiovascular;  Laterality: N/A;  . VIDEO ASSISTED THORACOSCOPY  12/18/2012   Procedure: VIDEO ASSISTED THORACOSCOPY;  Surgeon: Melrose Nakayama, MD;  Location: [place];  Service: Thoracic;  Laterality: Right;  pleural peel  . VIDEO ASSISTED THORACOSCOPY (VATS)/THOROCOTOMY Right 02/19/2013   Procedure: VIDEO ASSISTED THORACOSCOPY (VATS)/THOROCOTOMY;  Surgeon: Melrose Nakayama, MD;  Location: [place];  Service: Thoracic;  Laterality: Right;  Right Video Assisted Thoracoscopy, Right Thoracotomy, Decortication,   . VIDEO BRONCHOSCOPY WITH INSERTION OF INTERBRONCHIAL VALVE (IBV)  01/09/2013   Procedure: VIDEO BRONCHOSCOPY WITH INSERTION OF INTERBRONCHIAL VALVE (IBV);  Surgeon: Melrose Nakayama, MD;  Location: [place];  Service: Thoracic;  Laterality: N/A;  placement of 2 Intrabronchial valves in  right lower lobe  . VIDEO BRONCHOSCOPY WITH INSERTION OF INTERBRONCHIAL VALVE (IBV) Right 02/19/2013   Procedure: VIDEO BRONCHOSCOPY WITH INSERTION OF INTERBRONCHIAL VALVE (IBV);  Surgeon: Melrose Nakayama, MD;  Location: [place];  Service: Thoracic;  Laterality: Right;  removal of two interbronchial valves right chest    Prior to Admission medications   Medication Sig Start Date End Date Taking? Authorizing  Provider  acetaminophen (TYLENOL) 500 MG tablet Take 1,000 mg by mouth every 8 (eight) hours as needed for moderate pain.    Historical Provider, MD  amoxicillin-clavulanate (AUGMENTIN) 875-125 MG tablet Take 1 tablet by mouth 2 (two) times daily. 01/20/17 02/03/17  Gregor Hams, MD  Bismuth Subsalicylate (KAOPECTATE PO) Take 15 mLs by mouth daily as needed (for nausea).    Historical Provider, MD  calcium acetate (PHOSLO) 667 MG capsule Take 2,668 mg by mouth 3 (three) times daily with meals.     Historical Provider, MD  cinacalcet (SENSIPAR) 90 MG tablet Take 90 mg by mouth daily.    Historical Provider, MD  ciprofloxacin (CIPRO) 500 MG tablet Take 1 tablet (500 mg total) by mouth daily at 8 pm. 04/26/15   Fritzi Mandes, MD  metoprolol succinate (TOPROL-XL) 50 MG 24 hr tablet Take 50 mg by mouth daily. Take with or immediately following a meal.    Historical Provider, MD  metroNIDAZOLE (FLAGYL) 500 MG tablet Take 1 tablet (500 mg total) by mouth 3 (three) times daily. 04/26/15   Fritzi Mandes, MD  ondansetron (ZOFRAN) 4 MG tablet Take 4 mg by mouth every 8 (eight) hours as needed. 04/21/15   Historical Provider, MD  ondansetron (ZOFRAN) 4 MG tablet Take 1 tablet (4 mg total) by mouth daily as needed for nausea or vomiting. 01/20/17   Gregor Hams, MD  oxyCODONE 10 MG TABS Take 1 tablet (10 mg total) by mouth every 6 (six) hours as needed for moderate pain. 04/26/15   Fritzi Mandes, MD  oxyCODONE-acetaminophen (ROXICET) 5-325 MG tablet Take 1 tablet by mouth every 4 (four) hours as needed for severe pain. 01/20/17   Gregor Hams, MD  zolpidem (AMBIEN CR) 6.25 MG CR tablet Take 6.25 mg by mouth at bedtime as needed for sleep.    Historical Provider, MD    Allergies Tape  Family History  Problem Relation Age of Onset  . Thyroid disease Mother   . Renal Disease Maternal Grandmother   . Diabetes Maternal Grandmother   . Hypertension Maternal Aunt   . Hypertension Maternal Uncle     Social  History Social History  Substance Use Topics  . Smoking status: Current Every Day Smoker    Packs/day: 6.00    Years: 20.00    Types: Cigarettes  . Smokeless tobacco: Current User  . Alcohol use 1.8 oz/week    3 Cans of beer per week     Comment: occasional    Review of Systems Constitutional: No fever/chills Eyes: No visual changes. ENT: No sore throat. Cardiovascular: Denies chest pain. Respiratory: Denies shortness of breath. Gastrointestinal: Positive for abdominal pain No nausea, no vomiting.  No diarrhea.  No constipation. Genitourinary: Negative for dysuria. Musculoskeletal: Negative for back pain. Skin: Negative for rash. Neurological: Negative for headaches, focal weakness or numbness.  10-point ROS otherwise negative.  ____________________________________________   PHYSICAL EXAM:  VITAL SIGNS: ED Triage Vitals  Enc Vitals Group     BP 01/20/17 0158 124/82     Pulse Rate 01/20/17 0158 97     Resp  01/20/17 0158 (!) 28     Temp 01/20/17 0158 98.2 F (36.8 C)     Temp Source 01/20/17 0158 Oral     SpO2 01/20/17 0158 99 %     Weight 01/20/17 0159 229 lb 4.5 oz (104 kg)     Height 01/20/17 0159 6\' 1"  (1.854 m)     Head Circumference --      Peak Flow --      Pain Score 01/20/17 0159 10     Pain Loc --      Pain Edu? --      Excl. in [place]? --     Constitutional: Alert and oriented. Apparent discomfort. Eyes: Conjunctivae are normal. PE.RRL. EOMI. Head: Atraumatic. Mouth/Throat: Mucous membranes are moist Neck: No stridor.   Cardiovascular: Normal rate, regular rhythm. Good peripheral circulation. Grossly normal heart sounds. Respiratory: Normal respiratory effort.  No retractions. Lungs CTAB. Gastrointestinal: Left lower quadrant tenderness to palpation.. No distention.  Musculoskeletal: No lower extremity tenderness nor edema. No gross deformities of extremities. Neurologic:  Normal speech and language. No gross focal neurologic deficits are appreciated.   Skin:  Skin is warm, dry and intact. No rash noted.   ____________________________________________   LABS (all labs ordered are listed, but only abnormal results are displayed)  Labs Reviewed  LIPASE, BLOOD - Abnormal; Notable for the following:       Result Value   Lipase 82 (*)    All other components within normal limits  COMPREHENSIVE METABOLIC PANEL - Abnormal; Notable for the following:    Chloride 95 (*)    Glucose, Bld 104 (*)    BUN 62 (*)    Creatinine, Ser 15.43 (*)    Total Protein 8.4 (*)    ALT 9 (*)    GFR calc non Af Amer 3 (*)    GFR calc Af Amer 4 (*)    Anion gap 18 (*)    All other components within normal limits  CBC - Abnormal; Notable for the following:    RBC 4.27 (*)    Platelets 121 (*)    All other components within normal limits  URINALYSIS, COMPLETE (UACMP) WITH MICROSCOPIC     RADIOLOGY I, [place] N BROWN, personally viewed and evaluated these images (plain radiographs) as part of my medical decision making, as well as reviewing the written report by the radiologist.  Ct Angio Abd/pel W And/or Wo Contrast  Result Date: 01/20/2017 CLINICAL DATA:  Hervey Ard lower abdominal pain for 2 days. History of diverticulosis. Dialysis patient. EXAM: CTA ABDOMEN AND PELVIS wITHOUT AND WITH CONTRAST TECHNIQUE: Multidetector CT imaging of the abdomen and pelvis was performed using the standard protocol during bolus administration of intravenous contrast. Multiplanar reconstructed images and MIPs were obtained and reviewed to evaluate the vascular anatomy. CONTRAST:  100 mL Isovue 370 COMPARISON:  04/22/2015 FINDINGS: VASCULAR Aorta: Normal caliber aorta without aneurysm, dissection, vasculitis or significant stenosis. Aortic calcifications. Celiac: Patent without evidence of aneurysm, dissection, vasculitis or significant stenosis. SMA: Patent without evidence of aneurysm, dissection, vasculitis or significant stenosis. Renals: Renal arteries are diminutive but  appear minimally patent. This may represent chronic renal artery stenosis. IMA: Patent without evidence of aneurysm, dissection, vasculitis or significant stenosis. Inflow: Patent without evidence of aneurysm, dissection, vasculitis or significant stenosis. Proximal Outflow: Bilateral common femoral and visualized portions of the superficial and profunda femoral arteries are patent without evidence of aneurysm, dissection, vasculitis or significant stenosis. Veins: Multiple diffuse venous collaterals demonstrated throughout the visualize lower chest  and abdomen with early venous flow to the left femoral vein, iliac veins, and IVC, presumably related to the contrast injection on the left side. Inferior vena cava is not abnormally distended. Presence of multiple collaterals raises question of superior vena caval obstruction. Review of the MIP images confirms the above findings. NON-VASCULAR Lower chest: Lung bases are clear. Hepatobiliary: No focal liver abnormality is seen. No gallstones, gallbladder wall thickening, or biliary dilatation. Pancreas: Unremarkable. No pancreatic ductal dilatation or surrounding inflammatory changes. Spleen: Normal in size without focal abnormality. Adrenals/Urinary Tract: No adrenal gland nodules. Horseshoe kidney with bilateral renal cysts. No hydronephrosis or hydroureter. Renal nephrograms are delayed and there is diffuse renal atrophy. This is consistent with chronic renal disease. Bladder is decompressed. Stomach/Bowel: Stomach and small bowel are mostly decompressed. Contrast material in the colon. No colonic distention. Diverticulosis of the sigmoid colon with stranding around the sigmoid colon at the descending junction. Changes are consistent with acute cholecystitis. No abscess. Lymphatic: No abnormal abdominal lymphadenopathy. Scattered celiac axis lymph nodes are likely reactive. Reproductive: Prostate is unremarkable. Other: There is a ventral anterior abdominal wall  hernia containing fat. No change since prior study. No free air or free fluid in the abdomen. Musculoskeletal: Diffuse bone sclerosis likely indicating renal osteodystrophy. IMPRESSION: VASCULAR No aortic aneurysm or dissection. Aortic atherosclerosis. Multiple venous collaterals demonstrated throughout the lower chest in abdominal wall with extension to the groin regions bilaterally. This raises the question of possible SVC obstruction. Similar appearance to previous study. Renal arteries are somewhat diminutive, suggesting chronic renal artery stenosis. NON-VASCULAR Diverticulosis of sigmoid colon with inflammatory stranding consistent with acute diverticulitis. No abscess. Horseshoe kidney with diffuse renal atrophy and delayed nephrogram consistent with chronic renal disease. Diffuse bone sclerosis suggesting renal osteodystrophy. Electronically Signed   By: Lucienne Capers M.D.   On: 01/20/2017 03:53      Procedures     INITIAL IMPRESSION / ASSESSMENT AND PLAN / ED COURSE  Pertinent labs & imaging results that were available during my care of the patient were reviewed by me and considered in my medical decision making (see chart for details).  History of physical exam consistent with diverticulitis which was confirmed on CT scan.     ____________________________________________  FINAL CLINICAL IMPRESSION(S) / ED DIAGNOSES  Final diagnoses:  Diverticulitis of large intestine without perforation or abscess without bleeding     MEDICATIONS GIVEN DURING THIS VISIT:  Medications  oxyCODONE-acetaminophen (PERCOCET/ROXICET) 5-325 MG per tablet 1 tablet (1 tablet Oral Given 01/20/17 0552)  amoxicillin-clavulanate (AUGMENTIN) 875-125 MG per tablet 1 tablet (not administered)  ondansetron (ZOFRAN-ODT) disintegrating tablet 4 mg (4 mg Oral Given 01/20/17 0210)  iopamidol (ISOVUE-370) 76 % injection 100 mL (100 mLs Intravenous Contrast Given 01/20/17 0300)  morphine 4 MG/ML injection 4 mg  (4 mg Intravenous Given 01/20/17 0614)  ondansetron (ZOFRAN) injection 4 mg (4 mg Intravenous Given 01/20/17 0610)     NEW OUTPATIENT MEDICATIONS STARTED DURING THIS VISIT:  New Prescriptions   AMOXICILLIN-CLAVULANATE (AUGMENTIN) 875-125 MG TABLET    Take 1 tablet by mouth 2 (two) times daily.   ONDANSETRON (ZOFRAN) 4 MG TABLET    Take 1 tablet (4 mg total) by mouth daily as needed for nausea or vomiting.   OXYCODONE-ACETAMINOPHEN (ROXICET) 5-325 MG TABLET    Take 1 tablet by mouth every 4 (four) hours as needed for severe pain.    Modified Medications   No medications on file    Discontinued Medications   No medications on file  Note:  This document was prepared using Dragon voice recognition software and may include unintentional dictation errors.    Gregor Hams, MD 01/22/17 2217

## 2017-01-20 NOTE — ED Notes (Signed)
Kiko, Ripp., [phone], dropped off pt called for pick up or admission

## 2017-01-20 NOTE — ED Triage Notes (Addendum)
Patient wheelchair to triage with complaints of sharp lower abdominal pain (10/10) for 2 days (since dialysis on Friday - hx of chronic kidney disease).  Constant Sharp pain reminds pt diverticulosis worse "after eating a fruit cup".  Pt reports N/V/chills. Pt reports taking Kayopectate medications at 0100 before coming to ED.  Speaking in incomplete sentences and tachypnic

## 2017-03-01 ENCOUNTER — Encounter: Payer: Self-pay | Admitting: Emergency Medicine

## 2017-03-01 ENCOUNTER — Emergency Department
Admission: EM | Admit: 2017-03-01 | Discharge: 2017-03-01 | Disposition: A | Discharge status: Home / Self Care | Payer: Medicare Other | Attending: Emergency Medicine | Admitting: Emergency Medicine

## 2017-03-01 DIAGNOSIS — Z992 Dependence on renal dialysis: Secondary | ICD-10-CM | POA: Insufficient documentation

## 2017-03-01 DIAGNOSIS — F17210 Nicotine dependence, cigarettes, uncomplicated: Secondary | ICD-10-CM | POA: Insufficient documentation

## 2017-03-01 DIAGNOSIS — I509 Heart failure, unspecified: Secondary | ICD-10-CM | POA: Diagnosis not present

## 2017-03-01 DIAGNOSIS — N186 End stage renal disease: Secondary | ICD-10-CM | POA: Diagnosis not present

## 2017-03-01 DIAGNOSIS — Z79899 Other long term (current) drug therapy: Secondary | ICD-10-CM | POA: Diagnosis not present

## 2017-03-01 DIAGNOSIS — M542 Cervicalgia: Secondary | ICD-10-CM | POA: Diagnosis not present

## 2017-03-01 DIAGNOSIS — I132 Hypertensive heart and chronic kidney disease with heart failure and with stage 5 chronic kidney disease, or end stage renal disease: Secondary | ICD-10-CM | POA: Diagnosis not present

## 2017-03-01 NOTE — ED Notes (Signed)
Patient found walking to lobby "looking for chips" with dialysis catheter accessed. Staff redirected patient back to room.

## 2017-03-01 NOTE — ED Provider Notes (Addendum)
[facility] Emergency Department Provider Note  ____________________________________________  Time seen: Approximately 8:06 AM  I have reviewed the triage vital signs and the nursing notes.   HISTORY  Chief Complaint Neck Pain   HPI [name] [name] is a 56 y.o. male with h/o ESRD on HD (MWF), hypertension, and polysubstance abuse who presents from dialysis center for evaluation of neck pain. Patient tells me that he has been having neck pain for many years that he describes as a stabbing pain lasting 1 second located in the right base of his neck. He denies dizziness, headache, changes in vision, paresthesias of his extremities, chest pain, shortness of breath, palpitations, nausea or vomiting associated with this pain. Unable to determine if anything brings the pain on. Today he was in dialysis and had one episode of this pain lasting one second. He has only received 42min of dialysis. According to the patient he made a comment to the nurse about his neck pain who then called the ambulance and sent him to the emergency room without de-accessing patient dialysis fistula. According to the nurse patient requested to be transferred to the emergency room. Patient tells me that he doesn't want to be here and doesn't want to be dialyzed here and is requesting to be sent back to his dialysis unit. I spoke with the nurse at [facility] and she said she is able to finish his dialysis if we are able to send him back. Patient arrives with his fistula accessed from the center. Patient denies any neck pain at this time. He has no medical complaints.  Past Medical History:  Diagnosis Date  . Anemia of chronic disease    BL Hgb ~10  . CHF (congestive heart failure) ([place])    pt denies 04/01/14  . Diverticulitis   . End-stage renal disease on hemodialysis ([place])    HD m/w/f - right arm graft  . History of blood transfusion   . Hypertension   . Pneumonia    hx of  . Pneumonia   .  Polysubstance abuse   . Renal insufficiency   . Secondary hyperparathyroidism ([place])   . Sleep disturbance   . Tobacco abuse     Patient Active Problem List   Diagnosis Date Noted  . Diverticulitis of colon 04/24/2015  . Community acquired pneumonia 04/18/2015  . Sepsis ([place]) 04/16/2015  . S/P thoracotomy 03/04/2013  . 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 Bacteremia 01/06/2013  . Benign neoplasm of colon 12/16/2012  . Diverticulosis of colon (without mention of hemorrhage) 12/16/2012  . ESRD (end stage renal disease) ([place]) 12/12/2012  . H/O: hypertension 12/12/2012  . Pleural effusion, right 12/12/2012  . Hyperkalemia 12/11/2012  . Acute blood loss anemia 12/11/2012  . Sinus tachycardia, resolved with PRBC resuscitation 12/11/2012  . Lower GI bleed 12/11/2012  . End-stage renal disease on hemodialysis ([place])   . Secondary hyperparathyroidism ([place])   . Hypertension   . Polysubstance abuse   . Anemia of chronic disease   . Tobacco abuse     Past Surgical History:  Procedure Laterality Date  . APPENDECTOMY    . CHEST TUBE INSERTION  02/2013  . COLONOSCOPY  12/16/2012   Procedure: COLONOSCOPY;  Surgeon: Milus Banister, MD;  Location: [place];  Service: Endoscopy;  Laterality: N/A;  . PLEURAL EFFUSION DRAINAGE  12/18/2012   Procedure: DRAINAGE OF PLEURAL EFFUSION;  Surgeon: Melrose Nakayama, MD;  Location: [place];  Service: Thoracic;  Laterality: Right;  .  surgery for horseshoe kidney    . TALC PLEURODESIS Right 02/05/2013   Procedure: Pietro Cassis;  Surgeon: Melrose Nakayama, MD;  Location: [place];  Service: Thoracic;  Laterality: Right;  . TEE WITHOUT CARDIOVERSION N/A 01/16/2013   Procedure: TRANSESOPHAGEAL ECHOCARDIOGRAM (TEE);  Surgeon: Sanda Klein, MD;  Location: [facility] ENDOSCOPY;  Service: Cardiovascular;  Laterality: N/A;  . VIDEO ASSISTED THORACOSCOPY  12/18/2012   Procedure: VIDEO ASSISTED THORACOSCOPY;  Surgeon: Melrose Nakayama, MD;   Location: [place];  Service: Thoracic;  Laterality: Right;  pleural peel  . VIDEO ASSISTED THORACOSCOPY (VATS)/THOROCOTOMY Right 02/19/2013   Procedure: VIDEO ASSISTED THORACOSCOPY (VATS)/THOROCOTOMY;  Surgeon: Melrose Nakayama, MD;  Location: [place];  Service: Thoracic;  Laterality: Right;  Right Video Assisted Thoracoscopy, Right Thoracotomy, Decortication,   . VIDEO BRONCHOSCOPY WITH INSERTION OF INTERBRONCHIAL VALVE (IBV)  01/09/2013   Procedure: VIDEO BRONCHOSCOPY WITH INSERTION OF INTERBRONCHIAL VALVE (IBV);  Surgeon: Melrose Nakayama, MD;  Location: [place];  Service: Thoracic;  Laterality: N/A;  placement of 2 Intrabronchial valves in right lower lobe  . VIDEO BRONCHOSCOPY WITH INSERTION OF INTERBRONCHIAL VALVE (IBV) Right 02/19/2013   Procedure: VIDEO BRONCHOSCOPY WITH INSERTION OF INTERBRONCHIAL VALVE (IBV);  Surgeon: Melrose Nakayama, MD;  Location: [place];  Service: Thoracic;  Laterality: Right;  removal of two interbronchial valves right chest    Prior to Admission medications   Medication Sig Start Date End Date Taking? Authorizing Provider  acetaminophen (TYLENOL) 500 MG tablet Take 1,000 mg by mouth every 8 (eight) hours as needed for moderate pain.    Historical Provider, MD  Bismuth Subsalicylate (KAOPECTATE PO) Take 15 mLs by mouth daily as needed (for nausea).    Historical Provider, MD  calcium acetate (PHOSLO) 667 MG capsule Take 2,668 mg by mouth 3 (three) times daily with meals.     Historical Provider, MD  cinacalcet (SENSIPAR) 90 MG tablet Take 90 mg by mouth daily.    Historical Provider, MD  ciprofloxacin (CIPRO) 500 MG tablet Take 1 tablet (500 mg total) by mouth daily at 8 pm. 04/26/15   Fritzi Mandes, MD  metoprolol succinate (TOPROL-XL) 50 MG 24 hr tablet Take 50 mg by mouth daily. Take with or immediately following a meal.    Historical Provider, MD  metroNIDAZOLE (FLAGYL) 500 MG tablet Take 1 tablet (500 mg total) by mouth 3 (three) times daily. 04/26/15   Fritzi Mandes,  MD  ondansetron (ZOFRAN) 4 MG tablet Take 4 mg by mouth every 8 (eight) hours as needed. 04/21/15   Historical Provider, MD  ondansetron (ZOFRAN) 4 MG tablet Take 1 tablet (4 mg total) by mouth daily as needed for nausea or vomiting. 01/20/17   Gregor Hams, MD  oxyCODONE 10 MG TABS Take 1 tablet (10 mg total) by mouth every 6 (six) hours as needed for moderate pain. 04/26/15   Fritzi Mandes, MD  oxyCODONE-acetaminophen (ROXICET) 5-325 MG tablet Take 1 tablet by mouth every 4 (four) hours as needed for severe pain. 01/20/17   Gregor Hams, MD  zolpidem (AMBIEN CR) 6.25 MG CR tablet Take 6.25 mg by mouth at bedtime as needed for sleep.    Historical Provider, MD    Allergies Tape  Family History  Problem Relation Age of Onset  . Thyroid disease Mother   . Renal Disease Maternal Grandmother   . Diabetes Maternal Grandmother   . Hypertension Maternal Aunt   . Hypertension Maternal Uncle     Social History Social History  Substance  Use Topics  . Smoking status: Current Every Day Smoker    Packs/day: 6.00    Years: 20.00    Types: Cigarettes  . Smokeless tobacco: Current User  . Alcohol use 1.8 oz/week    3 Cans of beer per week     Comment: occasional    Review of Systems  Constitutional: Negative for fever. Eyes: Negative for visual changes. ENT: Negative for sore throat. Neck: + neck pain  Cardiovascular: Negative for chest pain. Respiratory: Negative for shortness of breath. Gastrointestinal: Negative for abdominal pain, vomiting or diarrhea. Genitourinary: Negative for dysuria. Musculoskeletal: Negative for back pain. Skin: Negative for rash. Neurological: Negative for headaches, weakness or numbness. Psych: No SI or HI  ____________________________________________   PHYSICAL EXAM:  VITAL SIGNS: ED Triage Vitals [03/01/17 0757]  Enc Vitals Group     BP (!) 139/95     Pulse Rate 77     Resp (!) 53     Temp 98.6 F (37 C)     Temp Source Oral     SpO2 100  %     Weight      Height      Head Circumference      Peak Flow      Pain Score      Pain Loc      Pain Edu?      Excl. in [place]?     Constitutional: Alert and oriented. Well appearing and in no apparent distress. HEENT:      Head: Normocephalic and atraumatic.         Eyes: Conjunctivae are normal. Sclera is non-icteric. EOMI. PERRL      Mouth/Throat: Mucous membranes are moist.       Neck: Supple with no signs of meningismus.Full painless range of motion, no palpable masses, no bruit, no lymphadenopathy Cardiovascular: Regular rate and rhythm. No murmurs, gallops, or rubs. 2+ symmetrical distal pulses are present in all extremities. No JVD. Fistula in the RUE with dialysis access still in place. Respiratory: Normal respiratory effort. Lungs are clear to auscultation bilaterally. No wheezes, crackles, or rhonchi.  Gastrointestinal: Soft, non tender, and non distended with positive bowel sounds. No rebound or guarding. Musculoskeletal: Nontender with normal range of motion in all extremities. No edema, cyanosis, or erythema of extremities. Neurologic: Normal speech and language. Face is symmetric. Moving all extremities. No gross focal neurologic deficits are appreciated. Skin: Skin is warm, dry and intact. No rash noted. Psychiatric: Mood and affect are normal. Speech and behavior are normal.  ____________________________________________   LABS (all labs ordered are listed, but only abnormal results are displayed)  Labs Reviewed  CBC  BASIC METABOLIC PANEL  TROPONIN I   ____________________________________________  EKG  ED ECG REPORT I, Rudene Re, the attending physician, personally viewed and interpreted this ECG.  Normal sinus rhythm, rate of 77, normal intervals, normal axis, no ST elevations or depressions, Q wave in anterior leads. No significant changes when compared to prior ____________________________________________  RADIOLOGY  none    ____________________________________________   PROCEDURES  Procedure(s) performed: None Procedures Critical Care performed:  None ____________________________________________   INITIAL IMPRESSION / ASSESSMENT AND PLAN / ED COURSE  56 y.o. male with h/o ESRD on HD (MWF), hypertension, and polysubstance abuse who presents from dialysis center for evaluation of neck pain. Patient is requesting to be sent back to his dialysis center and tells me he never wanted to be here in the first place. He says he has no pain at this  time and the pain has been going on for years. I discuss with patient that since his symptoms have been present for many years and he is asymptomatic at this time, that the most pressing issue is to finish his HD to prevent patient from becoming hyperkalemic, acidotic, and prevent any further complications from missing HD. I recommended checking blood work and finishing dialysis here. Patient is adamant that he will not be dialyzed here because the HD center here is too cold. He is therefore requesting to be transferred back to [place] to finish his HD. I spoke with RN at [facility] center who is able to finish patient's HD this morning. EMS has been called back to transport patient back. I recommended that patient follow up with his primary care doctor or return to the emergency department after his dialysis is done to have his neck pain evaluated. At this time I do believe that missing dialysis holds a higher risk of death or morbidity than postponing evaluation of his neck pain to this afternoon. Patient is in agreement.  8:27 AM on 03/01/2017 -----------------------------------------  Patient remains with no neck pain and HD stable. EMS here to transport back to [place]. Instructed patient to return to the ER for evaluation of neck pain after HD is done. Patient is in agreement.   Clinical Course as of Mar 01 948  Fri Mar 01, 2017  0838 EMS here to transport patient who is now  refusing transport and requesting to stay in the ED however he is declining blood work and IV placement. I explained to the patient that if he wants his neck pain evaluated we should do a CTA of the neck to rule out dissection or clot and also we need blood to rule emergent need for HD. Patient tells me he understands it but hates IVs and will not allow [place] to collect blood or place an IV. He is requesting to call his son so his son can come and get him. We have contacted out HD unit to request that his fistula be de-accessed.  Patient will be dc AMA.  [CV]  0940 Fistula de-accessed by HD RN  [CV]    Clinical Course User Index [CV] Rudene Re, MD    03/01/2017 at 9:45 AM:  The patient requested to leave.  I considered this to be leaving against medical advice. I personally discussed the following with them:  1)  That they currently had a medical condition of neck pain and missed HD treatment and I am concerned that they may have hyperkalemia, acidosis, electrolyte derangements, carotid dissection or clot.   2)  My proposed course of evaluation and treatment includes, but is not limited to,  Blood work and CTA of his neck.  Benefits of staying include possible diagnosis or excluding of emergent needs for HD or intervention or an alternative serious condition such as cervical artery dissection or clot, which if identified early would lead to appropriate intervention in a timely manner lessening the burden of disability and death.  3) Risks of leaving before this had been completed include: misdiagnosis, worsening illness leading up to and including prolonged or permanent disability or death.  Specific risks pertinent, but not all inclusive, of their current medical condition include but are not limited to arrhythmias, acidosis, flash pulmonary edema, death, arterial dissection or clot leading to stroke.  I also discussed alternatives including dialysis done here prior to discharge  Despite this  they stated they wanted to leave due to personal  problems and refused further evaluation, treatment, or admission at this time.   They appeared clinically sober, were mentating appropriately, were free from distracting injury, had adequately controlled acute pain, appeared to have intact insight, judgment, and reason, and in my opinion had the capacity to make this decision.  Specifically, they were able to verbally state back in a coherent manner their current medical condition/current diagnosis, the proposed course of evaluation and/or treatment, and the risks, benefits, and alternatives of treatment versus leaving against medical advice.   They understand that they may return to seek medical attention here at ANY time they want.  I strongly advised them to return to the Emergency Department immediately if they experience any new or worsening symptoms that concern them, or simply if they reconsider continued evaluation and/or treatment as previously discussed.  This would be without any repercussions, though they understand they likely will need to wait again in the Emergency Department if other patients are in front of them, rather than being brought straight back.  They understood this is another advantage of staying, but still insisted upon leaving.  I recommended they follow-up with his HD center immediately and PCP in the next 24 hours for further evaluation and treatment.   The patient was discharged against medical advice.  They did accept written discharge instructions.      Pertinent labs & imaging results that were available during my care of the patient were reviewed by me and considered in my medical decision making (see chart for details).    ____________________________________________   FINAL CLINICAL IMPRESSION(S) / ED DIAGNOSES  Final diagnoses:  Neck pain      NEW MEDICATIONS STARTED DURING THIS VISIT:  Discharge Medication List as of 03/01/2017  9:44 AM        Note:  This document was prepared using Dragon voice recognition software and may include unintentional dictation errors.    Rudene Re, MD 03/01/17 [place], MD 03/01/17 [phone]

## 2017-03-01 NOTE — ED Notes (Signed)
x3 Unable to contact dialysis RN to de access catheter . Will attempt again.

## 2017-03-01 NOTE — ED Notes (Signed)
Patient states that he does not want evaluation for his prior neck pain and he wishes to return to dialysis. Patient denies pain at this time.

## 2017-03-01 NOTE — ED Notes (Signed)
Patient on EMS Stretcher, Now refusing transport back to [place], requesting evaluation.

## 2017-03-01 NOTE — ED Notes (Signed)
Dialysis catheter successfully deaccessed by dialysis staff. No bleeding noted to site. Patient ready for discharge.

## 2017-03-01 NOTE — ED Notes (Addendum)
Patient now refusing labs and venous access. Requesting transport home with family. md at bedside

## 2017-03-01 NOTE — Discharge Instructions (Signed)
Return to the emergency room at any time to continue your evaluation. Follow up with your primary doctor as soon as you can. AT this time we are unable to determine the cause of your neck pain since you did not want blood work and IV placed for imaging studies. We also were unable to check your potassium and other labs to determine your need for dialysis today. Please return to the [facility] center today for continue treatment.

## 2017-03-01 NOTE — ED Triage Notes (Signed)
Patient arrived from dialysis with fistula accessed. Patient states he has had a sharp intermittent right sided neck pain for "years". He stated that he mentioned it to dialysis staff today and they requested evaluation by the ED. Patient received 50 minutes of dialysis.

## 2017-12-05 ENCOUNTER — Encounter: Payer: Self-pay | Admitting: Emergency Medicine

## 2017-12-05 ENCOUNTER — Inpatient Hospital Stay: Payer: Medicare Other

## 2017-12-05 ENCOUNTER — Other Ambulatory Visit: Payer: Self-pay

## 2017-12-05 ENCOUNTER — Emergency Department: Payer: Medicare Other

## 2017-12-05 ENCOUNTER — Inpatient Hospital Stay
Admission: EM | Admit: 2017-12-05 | Discharge: 2017-12-07 | DRG: 391 | Disposition: A | Discharge status: Home / Self Care | Payer: Medicare Other | Attending: Surgery | Admitting: Surgery

## 2017-12-05 DIAGNOSIS — N2581 Secondary hyperparathyroidism of renal origin: Secondary | ICD-10-CM | POA: Diagnosis present

## 2017-12-05 DIAGNOSIS — F17210 Nicotine dependence, cigarettes, uncomplicated: Secondary | ICD-10-CM | POA: Diagnosis present

## 2017-12-05 DIAGNOSIS — Z79899 Other long term (current) drug therapy: Secondary | ICD-10-CM

## 2017-12-05 DIAGNOSIS — Z9109 Other allergy status, other than to drugs and biological substances: Secondary | ICD-10-CM

## 2017-12-05 DIAGNOSIS — N250 Renal osteodystrophy: Secondary | ICD-10-CM | POA: Diagnosis present

## 2017-12-05 DIAGNOSIS — Z8701 Personal history of pneumonia (recurrent): Secondary | ICD-10-CM

## 2017-12-05 DIAGNOSIS — Z833 Family history of diabetes mellitus: Secondary | ICD-10-CM | POA: Diagnosis not present

## 2017-12-05 DIAGNOSIS — Q631 Lobulated, fused and horseshoe kidney: Secondary | ICD-10-CM

## 2017-12-05 DIAGNOSIS — Z8249 Family history of ischemic heart disease and other diseases of the circulatory system: Secondary | ICD-10-CM | POA: Diagnosis not present

## 2017-12-05 DIAGNOSIS — N186 End stage renal disease: Secondary | ICD-10-CM | POA: Diagnosis present

## 2017-12-05 DIAGNOSIS — Z992 Dependence on renal dialysis: Secondary | ICD-10-CM | POA: Diagnosis not present

## 2017-12-05 DIAGNOSIS — K5720 Diverticulitis of large intestine with perforation and abscess without bleeding: Secondary | ICD-10-CM | POA: Diagnosis present

## 2017-12-05 DIAGNOSIS — K439 Ventral hernia without obstruction or gangrene: Secondary | ICD-10-CM | POA: Diagnosis present

## 2017-12-05 DIAGNOSIS — I120 Hypertensive chronic kidney disease with stage 5 chronic kidney disease or end stage renal disease: Secondary | ICD-10-CM | POA: Diagnosis present

## 2017-12-05 DIAGNOSIS — K5732 Diverticulitis of large intestine without perforation or abscess without bleeding: Secondary | ICD-10-CM | POA: Diagnosis present

## 2017-12-05 DIAGNOSIS — D631 Anemia in chronic kidney disease: Secondary | ICD-10-CM | POA: Diagnosis present

## 2017-12-05 DIAGNOSIS — R1032 Left lower quadrant pain: Secondary | ICD-10-CM | POA: Diagnosis present

## 2017-12-05 DIAGNOSIS — K766 Portal hypertension: Secondary | ICD-10-CM

## 2017-12-05 LAB — COMPREHENSIVE METABOLIC PANEL
ALBUMIN: 4.2 g/dL (ref 3.5–5.0)
ALBUMIN: 3.9 g/dL (ref 3.5–5.0)
ALK PHOS: 66 U/L (ref 38–126)
ALT: 10 U/L — ABNORMAL LOW (ref 17–63)
ALT: 10 U/L — ABNORMAL LOW (ref 17–63)
ANION GAP: 10 (ref 5–15)
AST: 15 U/L (ref 15–41)
AST: 15 U/L (ref 15–41)
Alkaline Phosphatase: 59 U/L (ref 38–126)
Anion gap: 12 (ref 5–15)
BILIRUBIN TOTAL: 0.8 mg/dL (ref 0.3–1.2)
BILIRUBIN TOTAL: 1.1 mg/dL (ref 0.3–1.2)
BUN: 40 mg/dL — AB (ref 6–20)
BUN: 44 mg/dL — ABNORMAL HIGH (ref 6–20)
CALCIUM: 10.4 mg/dL — AB (ref 8.9–10.3)
CO2: 28 mmol/L (ref 22–32)
CO2: 27 mmol/L (ref 22–32)
Calcium: 10 mg/dL (ref 8.9–10.3)
Chloride: 97 mmol/L — ABNORMAL LOW (ref 101–111)
Chloride: 100 mmol/L — ABNORMAL LOW (ref 101–111)
Creatinine, Ser: 11.83 mg/dL — ABNORMAL HIGH (ref 0.61–1.24)
Creatinine, Ser: 12.8 mg/dL — ABNORMAL HIGH (ref 0.61–1.24)
GFR calc Af Amer: 5 mL/min — ABNORMAL LOW (ref >60)
GFR calc non Af Amer: 4 mL/min — ABNORMAL LOW (ref >60)
GFR, EST AFRICAN AMERICAN: 4 mL/min — AB (ref >60)
GFR, EST NON AFRICAN AMERICAN: 4 mL/min — AB (ref >60)
GLUCOSE: 106 mg/dL — AB (ref 65–99)
Glucose, Bld: 101 mg/dL — ABNORMAL HIGH (ref 65–99)
POTASSIUM: 4.7 mmol/L (ref 3.5–5.1)
POTASSIUM: 5.5 mmol/L — AB (ref 3.5–5.1)
Sodium: 137 mmol/L (ref 135–145)
Sodium: 137 mmol/L (ref 135–145)
TOTAL PROTEIN: 8.1 g/dL (ref 6.5–8.1)
TOTAL PROTEIN: 7.8 g/dL (ref 6.5–8.1)

## 2017-12-05 LAB — CBC WITH DIFFERENTIAL/PLATELET
BASOS ABS: 0 10*3/uL (ref 0–0.1)
BASOS PCT: 0 %
Eosinophils Absolute: 0 10*3/uL (ref 0–0.7)
Eosinophils Relative: 1 %
HEMATOCRIT: 37.2 % — AB (ref 40.0–52.0)
HEMOGLOBIN: 11.7 g/dL — AB (ref 13.0–18.0)
Lymphocytes Relative: 9 %
Lymphs Abs: 0.5 10*3/uL — ABNORMAL LOW (ref 1.0–3.6)
MCH: 30.1 pg (ref 26.0–34.0)
MCHC: 31.5 g/dL — ABNORMAL LOW (ref 32.0–36.0)
MCV: 95.5 fL (ref 80.0–100.0)
Monocytes Absolute: 0.5 10*3/uL (ref 0.2–1.0)
Monocytes Relative: 9 %
NEUTROS ABS: 4.9 10*3/uL (ref 1.4–6.5)
NEUTROS PCT: 81 %
Platelets: 94 10*3/uL — ABNORMAL LOW (ref 150–440)
RBC: 3.9 MIL/uL — ABNORMAL LOW (ref 4.40–5.90)
RDW: 15.1 % — ABNORMAL HIGH (ref 11.5–14.5)
WBC: 6 10*3/uL (ref 3.8–10.6)

## 2017-12-05 LAB — CBC
HEMATOCRIT: 36.9 % — AB (ref 40.0–52.0)
Hemoglobin: 11.8 g/dL — ABNORMAL LOW (ref 13.0–18.0)
MCH: 30.7 pg (ref 26.0–34.0)
MCHC: 32.1 g/dL (ref 32.0–36.0)
MCV: 95.7 fL (ref 80.0–100.0)
Platelets: 91 10*3/uL — ABNORMAL LOW (ref 150–440)
RBC: 3.85 MIL/uL — ABNORMAL LOW (ref 4.40–5.90)
RDW: 15.2 % — AB (ref 11.5–14.5)
WBC: 5.7 10*3/uL (ref 3.8–10.6)

## 2017-12-05 LAB — MAGNESIUM: MAGNESIUM: 2 mg/dL (ref 1.7–2.4)

## 2017-12-05 LAB — MRSA PCR SCREENING: MRSA by PCR: NEGATIVE

## 2017-12-05 LAB — APTT: aPTT: 35 seconds (ref 24–36)

## 2017-12-05 LAB — LIPASE, BLOOD: Lipase: 59 U/L — ABNORMAL HIGH (ref 11–51)

## 2017-12-05 LAB — PROTIME-INR
INR: 1.14
PROTHROMBIN TIME: 14.5 s (ref 11.4–15.2)

## 2017-12-05 LAB — PHOSPHORUS: PHOSPHORUS: 6.4 mg/dL — AB (ref 2.5–4.6)

## 2017-12-05 MED ORDER — METOPROLOL TARTRATE 5 MG/5ML IV SOLN
2.5000 mg | Freq: Four times a day (QID) | INTRAVENOUS | Status: DC
  Administered 2017-12-05 – 2017-12-06 (×4): 2.5 mg via INTRAVENOUS
  Filled 2017-12-05 (×4): qty 5

## 2017-12-05 MED ORDER — MORPHINE SULFATE (PF) 2 MG/ML IV SOLN
2.0000 mg | INTRAVENOUS | Status: DC | PRN
  Administered 2017-12-05: 2 mg via INTRAVENOUS
  Filled 2017-12-05: qty 1

## 2017-12-05 MED ORDER — HYDRALAZINE HCL 20 MG/ML IJ SOLN
10.0000 mg | INTRAMUSCULAR | Status: DC | PRN
  Administered 2017-12-05: 10 mg via INTRAVENOUS
  Filled 2017-12-05: qty 1

## 2017-12-05 MED ORDER — MORPHINE SULFATE (PF) 4 MG/ML IV SOLN
INTRAVENOUS | Status: AC
  Filled 2017-12-05: qty 1

## 2017-12-05 MED ORDER — PIPERACILLIN-TAZOBACTAM IN DEX 2-0.25 GM/50ML IV SOLN: 2.2500 g | Freq: Once | INTRAVENOUS | Status: DC

## 2017-12-05 MED ORDER — ONDANSETRON HCL 4 MG PO TABS: 4.0000 mg | ORAL_TABLET | Freq: Every day | ORAL | Status: DC | PRN

## 2017-12-05 MED ORDER — ONDANSETRON HCL 4 MG/2ML IJ SOLN
4.0000 mg | Freq: Once | INTRAMUSCULAR | Status: AC
  Administered 2017-12-05: 4 mg via INTRAVENOUS

## 2017-12-05 MED ORDER — ONDANSETRON HCL 4 MG/2ML IJ SOLN
INTRAMUSCULAR | Status: AC
  Filled 2017-12-05: qty 2

## 2017-12-05 MED ORDER — HYDRALAZINE HCL 20 MG/ML IJ SOLN
10.0000 mg | Freq: Four times a day (QID) | INTRAMUSCULAR | Status: DC | PRN
  Administered 2017-12-05: 10 mg via INTRAVENOUS
  Filled 2017-12-05: qty 1

## 2017-12-05 MED ORDER — OXYCODONE HCL 5 MG PO TABS: 10.0000 mg | ORAL_TABLET | ORAL | Status: DC | PRN

## 2017-12-05 MED ORDER — MORPHINE SULFATE (PF) 4 MG/ML IV SOLN
4.0000 mg | Freq: Once | INTRAVENOUS | Status: AC
Start: 2017-12-05 — End: 2017-12-05
  Administered 2017-12-05: 4 mg via INTRAVENOUS

## 2017-12-05 MED ORDER — ONDANSETRON 4 MG PO TBDP
4.0000 mg | ORAL_TABLET | Freq: Four times a day (QID) | ORAL | Status: DC | PRN
  Filled 2017-12-05: qty 1

## 2017-12-05 MED ORDER — BISMUTH SUBSALICYLATE 262 MG/15ML PO SUSP
30.0000 mL | Freq: Every day | ORAL | Status: DC | PRN
  Filled 2017-12-05: qty 118

## 2017-12-05 MED ORDER — PIPERACILLIN-TAZOBACTAM 3.375 G IVPB
3.3750 g | Freq: Two times a day (BID) | INTRAVENOUS | Status: DC
  Administered 2017-12-05 – 2017-12-06 (×3): 3.375 g via INTRAVENOUS
  Filled 2017-12-05 (×3): qty 50

## 2017-12-05 MED ORDER — ACETAMINOPHEN 500 MG PO TABS: 1000.0000 mg | ORAL_TABLET | Freq: Three times a day (TID) | ORAL | Status: DC | PRN

## 2017-12-05 MED ORDER — METOPROLOL SUCCINATE ER 50 MG PO TB24
50.0000 mg | ORAL_TABLET | Freq: Every day | ORAL | Status: DC
  Administered 2017-12-05: 50 mg via ORAL
  Filled 2017-12-05: qty 1

## 2017-12-05 MED ORDER — PIPERACILLIN-TAZOBACTAM IN DEX 2-0.25 GM/50ML IV SOLN: 2.2500 g | Freq: Three times a day (TID) | INTRAVENOUS | Status: DC

## 2017-12-05 MED ORDER — ZOLPIDEM TARTRATE 5 MG PO TABS
5.0000 mg | ORAL_TABLET | Freq: Every day | ORAL | Status: DC
  Administered 2017-12-06: 5 mg via ORAL
  Filled 2017-12-05 (×2): qty 1

## 2017-12-05 MED ORDER — PANTOPRAZOLE SODIUM 40 MG IV SOLR
40.0000 mg | Freq: Every day | INTRAVENOUS | Status: DC
  Administered 2017-12-06 (×2): 40 mg via INTRAVENOUS
  Filled 2017-12-05 (×2): qty 40

## 2017-12-05 MED ORDER — CALCIUM ACETATE (PHOS BINDER) 667 MG PO CAPS
2668.0000 mg | ORAL_CAPSULE | Freq: Three times a day (TID) | ORAL | Status: DC
  Filled 2017-12-05 (×3): qty 4

## 2017-12-05 MED ORDER — OXYCODONE HCL 5 MG PO TABS
10.0000 mg | ORAL_TABLET | ORAL | Status: DC | PRN
  Administered 2017-12-06 – 2017-12-07 (×2): 10 mg via ORAL
  Filled 2017-12-05 (×2): qty 2

## 2017-12-05 MED ORDER — MORPHINE SULFATE (PF) 4 MG/ML IV SOLN
4.0000 mg | Freq: Once | INTRAVENOUS | Status: AC
  Administered 2017-12-05: 4 mg via INTRAVENOUS

## 2017-12-05 MED ORDER — HEPARIN SODIUM (PORCINE) 5000 UNIT/ML IJ SOLN
5000.0000 [IU] | Freq: Three times a day (TID) | INTRAMUSCULAR | Status: DC
  Administered 2017-12-05 – 2017-12-07 (×4): 5000 [IU] via SUBCUTANEOUS
  Filled 2017-12-05 (×5): qty 1

## 2017-12-05 MED ORDER — ONDANSETRON HCL 4 MG/2ML IJ SOLN
4.0000 mg | Freq: Four times a day (QID) | INTRAMUSCULAR | Status: DC | PRN
  Administered 2017-12-05 – 2017-12-07 (×2): 4 mg via INTRAVENOUS
  Filled 2017-12-05 (×2): qty 2

## 2017-12-05 MED ORDER — PIPERACILLIN-TAZOBACTAM 3.375 G IVPB 30 MIN: 3.3750 g | Freq: Once | INTRAVENOUS | Status: DC

## 2017-12-05 MED ORDER — CINACALCET HCL 30 MG PO TABS
90.0000 mg | ORAL_TABLET | Freq: Every day | ORAL | Status: DC
  Filled 2017-12-05: qty 3

## 2017-12-05 MED ORDER — SODIUM CHLORIDE 0.9 % IV SOLN
INTRAVENOUS | Status: DC
  Administered 2017-12-05: 07:00:00 via INTRAVENOUS

## 2017-12-05 NOTE — ED Notes (Signed)
Pt reports last alcohol was yesterday and he had a half a beer.

## 2017-12-05 NOTE — ED Notes (Signed)
Pt given meds for nausea and pain. Pt then began throwing up immediately after. MD notified. New order received for zofran 4mg 

## 2017-12-05 NOTE — Progress Notes (Signed)
The patient has no abdominal pain, nausea or vomiting. Vital signs reviewed. Physical examination is unremarkable except abdominal tenderness on the left lower quadrant. Continue current treatment.  Time spent about 22 minutes.

## 2017-12-05 NOTE — Progress Notes (Addendum)
Pharmacy Antibiotic Note  ROMELL WOLDEN is a 56 y.o. male admitted on 12/05/2017 with intra-abdominal infection.  Pharmacy has been consulted for Zosyn dosing.  Plan: Zosyn 3.375 grams q 12 hours EI ordered.  Height: 6\' 1"  (185.4 cm) Weight: 227 lb 1.2 oz (103 kg) IBW/kg (Calculated) : 79.9  Temp (24hrs), Avg:98.6 F (37 C), Min:98.2 F (36.8 C), Max:98.9 F (37.2 C)  Recent Labs  Lab 12/05/17 0137  WBC 6.0  CREATININE 11.83*    Estimated Creatinine Clearance: 9 mL/min (A) (by C-G formula based on SCr of 11.83 mg/dL (H)).    Allergies  Allergen Reactions  . Tape Other (See Comments)    Plastic Tape Causes blister    Antimicrobials this admission: Zosyn 12/27  >>    >>   Dose adjustments this admission:   Microbiology results: No micro  Thank you for allowing pharmacy to be a part of this patient's care.  Willi Borowiak S 12/05/2017 6:57 AM

## 2017-12-05 NOTE — Progress Notes (Signed)
[place], [place] 12/05/17  Subjective:   Patient known to our practice from outpatient dialysis.  He states that he presents for abdominal pain that is moderate to severe.  He states he has not had anything much to eat for the past 2-3 days. CT scan of the abdomen shows acute sigmoid diverticulitis compatible with microperforation.  No abscess. Patient had his dialysis yesterday. No shortness of breath at present.  Potassium is mildly elevated at 5.5  Objective:  Vital signs in last 24 hours:  Temp:  [98.2 F (36.8 C)-98.9 F (37.2 C)] 98.2 F (36.8 C) (12/27 7494) Pulse Rate:  [71-91] 80 (12/27 1313) Resp:  [19-24] 20 (12/27 0833) BP: (169-197)/(87-102) 171/92 (12/27 1313) SpO2:  [93 %-100 %] 96 % (12/27 0833) Weight:  [103 kg (227 lb 1.2 oz)] 103 kg (227 lb 1.2 oz) (12/27 0650)  Weight change:  Filed Weights   12/05/17 0105 12/05/17 0650  Weight: 103 kg (227 lb 1.2 oz) 103 kg (227 lb 1.2 oz)    Intake/Output:    Intake/Output Summary (Last 24 hours) at 12/05/2017 1628 Last data filed at 12/05/2017 1500 Gross per 24 hour  Intake 438.33 ml  Output -  Net 438.33 ml     Physical Exam: General: No acute distress, ambulatory in the room  HEENT anicteric  Neck supple  Pulm/lungs clear to auscultation bilaterally  CVS/Heart Regular rhythm, no murmur  Abdomen:  Soft, nontender  Extremities: No edema  Neurologic: Alert, oriented  Skin: No acute rashes  Access: AV fistula, good thrill       Basic Metabolic Panel:  Recent Labs  Lab 12/05/17 0137 12/05/17 0724  NA 137 137  K 4.7 5.5*  CL 97* 100*  CO2 28 27  GLUCOSE 106* 101*  BUN 40* 44*  CREATININE 11.83* 12.80*  CALCIUM 10.4* 10.0  MG  --  2.0  PHOS  --  6.4*     CBC: Recent Labs  Lab 12/05/17 0137 12/05/17 0724  WBC 6.0 5.7  NEUTROABS 4.9  --   HGB 11.7* 11.8*  HCT 37.2* 36.9*  MCV 95.5 95.7  PLT 94* 91*      Lab Results  Component Value Date   HEPBSAG  NEGATIVE 02/20/2013      Microbiology:  Recent Results (from the past 240 hour(s))  MRSA PCR Screening     Status: None   Collection Time: 12/05/17  1:29 PM  Result Value Ref Range Status   MRSA by PCR NEGATIVE NEGATIVE Final    Comment:        The GeneXpert MRSA Assay (FDA approved for NASAL specimens only), is one component of a comprehensive MRSA colonization surveillance program. It is not intended to diagnose MRSA infection nor to guide or monitor treatment for MRSA infections. Performed at [facility], [address] [place], [place] [address]     Coagulation Studies: Recent Labs    12/05/17 0724  LABPROT 14.5  INR 1.14    Urinalysis: No results for input(s): COLORURINE, LABSPEC, PHURINE, GLUCOSEU, HGBUR, BILIRUBINUR, KETONESUR, PROTEINUR, UROBILINOGEN, NITRITE, LEUKOCYTESUR in the last 72 hours.  Invalid input(s): APPERANCEUR    Imaging: Ct Abdomen Pelvis Wo Contrast  Result Date: 12/05/2017 CLINICAL DATA:  56 y/o M; 3 days of lower abdominal pain with nausea and vomiting. EXAM: CT ABDOMEN AND PELVIS WITHOUT CONTRAST TECHNIQUE: Multidetector CT imaging of the abdomen and pelvis was performed following the standard protocol without IV contrast. COMPARISON:  01/20/2017 CT abdomen and pelvis  FINDINGS: Lower chest: No acute abnormality. Hepatobiliary: No focal liver abnormality is seen. No gallstones, gallbladder wall thickening, or biliary dilatation. Pancreas: Unremarkable. No pancreatic ductal dilatation or surrounding inflammatory changes. Spleen: Normal in size without focal abnormality. Adrenals/Urinary Tract: Atrophic horseshoe kidney with multiple cysts. Normal adrenal glands. No hydronephrosis. Mild bladder wall thickening, probably reactive inflammation due to adjacent diverticulitis. Stomach/Bowel: Acute sigmoid diverticulitis. Punctate foci of extraluminal air present in pericolonic fat (series 2, image 72) compatible microperforation. No  abscess. Otherwise small and large bowel are unremarkable. Vascular/Lymphatic: Aortic atherosclerosis. No enlarged abdominal or pelvic lymph nodes. Numerous venous collaterals throughout the subcutaneous fat of the torso. Reproductive: Prostate is unremarkable. Other: Ventral abdominal hernia 3.8 cm superior to the umbilicus with 3.2 cm neck containing fat. Musculoskeletal: Bone sclerosis compatible with renal osteodystrophy. No acute fracture. IMPRESSION: 1. Acute sigmoid diverticulitis. Punctate foci of extraluminal air compatible with microperforation. No abscess. 2. Atrophic horseshoe kidney. 3. Ventral abdominal hernia containing fat superior to umbilicus. 4. Bony stigmata of renal osteodystrophy. 5. Aortic atherosclerosis. 6. Numerous venous collaterals throughout subcutaneous fat of torso. Electronically Signed   By: Kristine Garbe M.D.   On: 12/05/2017 03:26   US Liver Doppler  Result Date: 12/05/2017 CLINICAL DATA:  56 year old male with a history of superior vena cava obstruction, abdominal wall collaterals EXAM: DUPLEX ULTRASOUND OF LIVER TECHNIQUE: Color and duplex Doppler ultrasound was performed to evaluate the hepatic in-flow and out-flow vessels. COMPARISON:  None. FINDINGS: Portal Vein Velocities Main:  56 cm/sec Right:  38 cm/sec Left:  45 cm/sec Hepatic Vein Velocities Right:  15 cm/sec Middle:  45 cm/sec Left:  14 cm/sec Hepatic Artery Velocity:  127 cm/sec Splenic Vein Velocity:  22 cm/sec Varices: No varices. Ascites: No ascites IMPRESSION: Unremarkable hepatic Doppler study. Electronically Signed   By: Corrie Mckusick D.O.   On: 12/05/2017 10:18     Medications:   . sodium chloride 50 mL/hr at 12/05/17 0714  . piperacillin-tazobactam (ZOSYN)  IV Stopped (12/05/17 1315)   . calcium acetate  2,668 mg Oral TID WC  . cinacalcet  90 mg Oral Daily  . heparin  5,000 Units Subcutaneous Q8H  . metoprolol tartrate  2.5 mg Intravenous Q6H  . pantoprazole (PROTONIX) IV  40 mg  Intravenous QHS  . zolpidem  5 mg Oral QHS   acetaminophen, hydrALAZINE, hydrALAZINE, morphine injection, ondansetron **OR** ondansetron (ZOFRAN) IV, oxyCODONE  Assessment/ Plan:  56 y.o. male  with ESRD, hypertension, SHPTH, anemia, right sided pleural effusion s/p talc pleurodesis, right chest tube placement, right chest decortication   [facility]/ [facility] dialysis/MWF  1.  ESRD 2.  Anemia of chronic kidney disease 3.  Secondary hyperparathyroidism 4.  Acute diverticulitis  We will plan for dialysis tomorrow We will continue Procrit with dialysis for anemia Administer calcium acetate and Sensipar with meals only Restart when able to eat normal diet    LOS: 0 Adaya Garmany 12/27/20184:28 PM  [facility] [place], [place]

## 2017-12-05 NOTE — Progress Notes (Signed)
  Visited with patient this afternoon.  States he is feeling much better than when he came into the hospital.  Abdomen is soft and nontender on exam.  Continue IV antibiotics.  Trend labs in the morning.  Discussed that he be seen by my partner Dr. Burt Knack in the morning.  Clayburn Pert, MD [facility]  Day [facility] [phone] Night [facility] [phone]

## 2017-12-05 NOTE — Care Management (Signed)
Notified dialysis coordinator of admission.

## 2017-12-05 NOTE — Consult Note (Signed)
Reason for Consult: Uncontrolled blood pressure Referring Physician: Dahlia Byes, MD  [name] [name] is an 56 y.o. male.  HPI: The patient with past medical history of end-stage renal disease on hemodialysis and hypertension presents to the emergency department complaining of abdominal pain.  The patient states that his pain began yesterday and has worsened in severity.  He is nauseous and has had multiple episodes of nonbloody nonbilious emesis.  CT of his abdomen revealed diverticulitis with a perforation of his large intestine.  Surgical service admitted the patient for management of his perforation and asked the hospitalist service for guidance regarding medical management of comorbidities.  Past Medical History:  Diagnosis Date  . Anemia of chronic disease    BL Hgb ~10  . CHF (congestive heart failure) ([place])    pt denies 04/01/14  . Diverticulitis   . End-stage renal disease on hemodialysis ([place])    HD m/w/f - right arm graft  . History of blood transfusion   . Hypertension   . Pneumonia    hx of  . Pneumonia   . Polysubstance abuse ([place])   . Renal insufficiency   . Secondary hyperparathyroidism ([place])   . Sleep disturbance   . Tobacco abuse     Past Surgical History:  Procedure Laterality Date  . APPENDECTOMY    . CHEST TUBE INSERTION  02/2013  . COLONOSCOPY  12/16/2012   Procedure: COLONOSCOPY;  Surgeon: Milus Banister, MD;  Location: [place];  Service: Endoscopy;  Laterality: N/A;  . PLEURAL EFFUSION DRAINAGE  12/18/2012   Procedure: DRAINAGE OF PLEURAL EFFUSION;  Surgeon: Melrose Nakayama, MD;  Location: [place];  Service: Thoracic;  Laterality: Right;  . surgery for horseshoe kidney    . TALC PLEURODESIS Right 02/05/2013   Procedure: Pietro Cassis;  Surgeon: Melrose Nakayama, MD;  Location: [place];  Service: Thoracic;  Laterality: Right;  . TEE WITHOUT CARDIOVERSION N/A 01/16/2013   Procedure: TRANSESOPHAGEAL ECHOCARDIOGRAM (TEE);  Surgeon: Sanda Klein, MD;   Location[facility] ENDOSCOPY;  Service: Cardiovascular;  Laterality: N/A;  . VIDEO ASSISTED THORACOSCOPY  12/18/2012   Procedure: VIDEO ASSISTED THORACOSCOPY;  Surgeon: Melrose Nakayama, MD;  Location: [place];  Service: Thoracic;  Laterality: Right;  pleural peel  . VIDEO ASSISTED THORACOSCOPY (VATS)/THOROCOTOMY Right 02/19/2013   Procedure: VIDEO ASSISTED THORACOSCOPY (VATS)/THOROCOTOMY;  Surgeon: Melrose Nakayama, MD;  Location: [place];  Service: Thoracic;  Laterality: Right;  Right Video Assisted Thoracoscopy, Right Thoracotomy, Decortication,   . VIDEO BRONCHOSCOPY WITH INSERTION OF INTERBRONCHIAL VALVE (IBV)  01/09/2013   Procedure: VIDEO BRONCHOSCOPY WITH INSERTION OF INTERBRONCHIAL VALVE (IBV);  Surgeon: Melrose Nakayama, MD;  Location: [place];  Service: Thoracic;  Laterality: N/A;  placement of 2 Intrabronchial valves in right lower lobe  . VIDEO BRONCHOSCOPY WITH INSERTION OF INTERBRONCHIAL VALVE (IBV) Right 02/19/2013   Procedure: VIDEO BRONCHOSCOPY WITH INSERTION OF INTERBRONCHIAL VALVE (IBV);  Surgeon: Melrose Nakayama, MD;  Location: [place];  Service: Thoracic;  Laterality: Right;  removal of two interbronchial valves right chest    Family History  Problem Relation Age of Onset  . Thyroid disease Mother   . Renal Disease Maternal Grandmother   . Diabetes Maternal Grandmother   . Hypertension Maternal Aunt   . Hypertension Maternal Uncle     Social History:  reports that he has been smoking cigarettes.  He has a 120.00 pack-year smoking history. He uses smokeless tobacco. He reports that he drinks about 1.8 oz of alcohol per week. He  reports that he uses drugs. Drug: Marijuana.  Allergies:  Allergies  Allergen Reactions  . Tape Other (See Comments)    Plastic Tape Causes blister    Medications:  I have reviewed the patient's current medications. Prior to Admission:  Medications Prior to Admission  Medication Sig Dispense Refill Last Dose  . acetaminophen (TYLENOL) 500  MG tablet Take 1,000 mg by mouth every 8 (eight) hours as needed for moderate pain.   prn at prn  . Bismuth Subsalicylate (KAOPECTATE PO) Take 15 mLs by mouth daily as needed (for nausea).   prn at prn  . calcium acetate (PHOSLO) 667 MG capsule Take 2,668 mg by mouth 3 (three) times daily with meals.    12/04/2017 at Unknown time  . cinacalcet (SENSIPAR) 90 MG tablet Take 90 mg by mouth daily.   Not Taking at Unknown time  . metoprolol succinate (TOPROL-XL) 50 MG 24 hr tablet Take 50 mg by mouth daily. Take with or immediately following a meal.   Not Taking at Unknown time  . ondansetron (ZOFRAN) 4 MG tablet Take 4 mg by mouth every 8 (eight) hours as needed.   Not Taking at Unknown time  . zolpidem (AMBIEN CR) 6.25 MG CR tablet Take 6.25 mg by mouth at bedtime as needed for sleep.   Not Taking at Unknown time   Scheduled: . calcium acetate  2,668 mg Oral TID WC  . cinacalcet  90 mg Oral Daily  . heparin  5,000 Units Subcutaneous Q8H  . metoprolol succinate  50 mg Oral Daily  . pantoprazole (PROTONIX) IV  40 mg Intravenous QHS  . zolpidem  5 mg Oral QHS   Continuous: . sodium chloride 50 mL/hr at 12/05/17 0714  . piperacillin-tazobactam (ZOSYN)  IV      Results for orders placed or performed during the hospital encounter of 12/05/17 (from the past 48 hour(s))  CBC with Differential     Status: Abnormal   Collection Time: 12/05/17  1:37 AM  Result Value Ref Range   WBC 6.0 3.8 - 10.6 K/uL   RBC 3.90 (L) 4.40 - 5.90 MIL/uL   Hemoglobin 11.7 (L) 13.0 - 18.0 g/dL   HCT 37.2 (L) 40.0 - 52.0 %   MCV 95.5 80.0 - 100.0 fL   MCH 30.1 26.0 - 34.0 pg   MCHC 31.5 (L) 32.0 - 36.0 g/dL   RDW 15.1 (H) 11.5 - 14.5 %   Platelets 94 (L) 150 - 440 K/uL   Neutrophils Relative % 81 %   Neutro Abs 4.9 1.4 - 6.5 K/uL   Lymphocytes Relative 9 %   Lymphs Abs 0.5 (L) 1.0 - 3.6 K/uL   Monocytes Relative 9 %   Monocytes Absolute 0.5 0.2 - 1.0 K/uL   Eosinophils Relative 1 %   Eosinophils Absolute 0.0 0  - 0.7 K/uL   Basophils Relative 0 %   Basophils Absolute 0.0 0 - 0.1 K/uL    Comment: Performed at [facility], [address] [place], [place] [address]  Comprehensive metabolic panel     Status: Abnormal   Collection Time: 12/05/17  1:37 AM  Result Value Ref Range   Sodium 137 135 - 145 mmol/L   Potassium 4.7 3.5 - 5.1 mmol/L   Chloride 97 (L) 101 - 111 mmol/L   CO2 28 22 - 32 mmol/L   Glucose, Bld 106 (H) 65 - 99 mg/dL   BUN 40 (H) 6 - 20 mg/dL   Creatinine, Ser 11.83 (H)  0.61 - 1.24 mg/dL   Calcium 10.4 (H) 8.9 - 10.3 mg/dL   Total Protein 8.1 6.5 - 8.1 g/dL   Albumin 4.2 3.5 - 5.0 g/dL   AST 15 15 - 41 U/L   ALT 10 (L) 17 - 63 U/L   Alkaline Phosphatase 66 38 - 126 U/L   Total Bilirubin 0.8 0.3 - 1.2 mg/dL   GFR calc non Af Amer 4 (L) >60 mL/min   GFR calc Af Amer 5 (L) >60 mL/min    Comment: (NOTE) The eGFR has been calculated using the CKD EPI equation. This calculation has not been validated in all clinical situations. eGFR's persistently <60 mL/min signify possible Chronic Kidney Disease.    Anion gap 12 5 - 15    Comment: Performed at [facility], [address] [place], [place] [address]  Lipase, blood     Status: Abnormal   Collection Time: 12/05/17  1:37 AM  Result Value Ref Range   Lipase 59 (H) 11 - 51 U/L    Comment: Performed at [facility], [place], [place] [address]    Ct Abdomen Pelvis Wo Contrast  Result Date: 12/05/2017 CLINICAL DATA:  56 y/o M; 3 days of lower abdominal pain with nausea and vomiting. EXAM: CT ABDOMEN AND PELVIS WITHOUT CONTRAST TECHNIQUE: Multidetector CT imaging of the abdomen and pelvis was performed following the standard protocol without IV contrast. COMPARISON:  01/20/2017 CT abdomen and pelvis FINDINGS: Lower chest: No acute abnormality. Hepatobiliary: No focal liver abnormality is seen. No gallstones, gallbladder wall thickening, or biliary dilatation. Pancreas: Unremarkable. No  pancreatic ductal dilatation or surrounding inflammatory changes. Spleen: Normal in size without focal abnormality. Adrenals/Urinary Tract: Atrophic horseshoe kidney with multiple cysts. Normal adrenal glands. No hydronephrosis. Mild bladder wall thickening, probably reactive inflammation due to adjacent diverticulitis. Stomach/Bowel: Acute sigmoid diverticulitis. Punctate foci of extraluminal air present in pericolonic fat (series 2, image 72) compatible microperforation. No abscess. Otherwise small and large bowel are unremarkable. Vascular/Lymphatic: Aortic atherosclerosis. No enlarged abdominal or pelvic lymph nodes. Numerous venous collaterals throughout the subcutaneous fat of the torso. Reproductive: Prostate is unremarkable. Other: Ventral abdominal hernia 3.8 cm superior to the umbilicus with 3.2 cm neck containing fat. Musculoskeletal: Bone sclerosis compatible with renal osteodystrophy. No acute fracture. IMPRESSION: 1. Acute sigmoid diverticulitis. Punctate foci of extraluminal air compatible with microperforation. No abscess. 2. Atrophic horseshoe kidney. 3. Ventral abdominal hernia containing fat superior to umbilicus. 4. Bony stigmata of renal osteodystrophy. 5. Aortic atherosclerosis. 6. Numerous venous collaterals throughout subcutaneous fat of torso. Electronically Signed   By: Kristine Garbe M.D.   On: 12/05/2017 03:26    Review of Systems  Constitutional: Negative for chills and fever.  HENT: Negative for sore throat and tinnitus.   Eyes: Negative for blurred vision and redness.  Respiratory: Negative for cough and shortness of breath.   Cardiovascular: Negative for chest pain, palpitations, orthopnea and PND.  Gastrointestinal: Positive for abdominal pain. Negative for diarrhea, nausea and vomiting.  Genitourinary: Negative for dysuria, frequency and urgency.  Musculoskeletal: Negative for joint pain and myalgias.  Skin: Negative for rash.       No lesions  Neurological:  Negative for speech change, focal weakness and weakness.  Endo/Heme/Allergies: Does not bruise/bleed easily.       No temperature intolerance  Psychiatric/Behavioral: Negative for depression and suicidal ideas.   Blood pressure (!) 186/99, pulse 75, temperature 98.9 F (37.2 C), temperature source Oral, resp. rate (!) 24, height 6'  1" (1.854 m), weight 103 kg (227 lb 1.2 oz), SpO2 95 %. Physical Exam  Vitals reviewed. Constitutional: He is oriented to person, place, and time. He appears well-developed and well-nourished. No distress.  HENT:  Head: Normocephalic and atraumatic.  Mouth/Throat: Oropharynx is clear and moist.  Eyes: Conjunctivae and EOM are normal. Pupils are equal, round, and reactive to light. No scleral icterus.  Neck: Normal range of motion. Neck supple. No JVD present. No tracheal deviation present. No thyromegaly present.  Cardiovascular: Normal rate, regular rhythm and normal heart sounds. Exam reveals no gallop and no friction rub.  No murmur heard. Respiratory: Effort normal and breath sounds normal. No respiratory distress.  GI: Soft. Bowel sounds are normal. He exhibits no distension. There is tenderness. There is no rebound.  Genitourinary:  Genitourinary Comments: Deferred  Musculoskeletal: Normal range of motion. He exhibits no edema.  Lymphadenopathy:    He has no cervical adenopathy.  Neurological: He is alert and oriented to person, place, and time. No cranial nerve deficit.  Skin: Skin is warm and dry. No rash noted. No erythema.  Psychiatric: He has a normal mood and affect. His behavior is normal. Judgment and thought content normal.    Assessment/Plan: This is a 56 year old male with diverticulitis and perforation of his large intestine.  He has uncontrolled hypertension and needs management of his pain and nausea. 1.  Hypertension:; Continue metoprolol.  Add hydralazine as needed. 2.  Diverticulitis with perforation: Punctate foci of extraluminal  air.  Continue Zosyn.  Anticipatory management by surgery at this time.  Manage pain and nausea. 3.  ESRD: On dialysis.  Consult nephrology for continuation of hemodialysis.  Continue PhosLo and Sensipar for renal osteodystrophy. 4.  DVT prophylaxis at the discretion of surgical service. 5.  GI prophylaxis: Pantoprazole Thank you for involving [place] in the care of this patient.  We will continue to follow.  [name] [name] 12/05/2017, 6:02 AM

## 2017-12-05 NOTE — ED Notes (Signed)
Pt O2 sats are dropping into the 80s. Pt is conscious. Instructed to take dep breaths. Pt continues to constantly moan. Instructed pt again to take deep breaths through hisnose. Sats are coming up to low 90s. Pt placed on 2L by nasal cannula.

## 2017-12-05 NOTE — ED Provider Notes (Signed)
[facility] Emergency Department Provider Note   First MD Initiated Contact with Patient 12/05/17 0126     (approximate)  I have reviewed the triage vital signs and the nursing notes.   HISTORY  Chief Complaint Abdominal Pain    HPI [name] [name] is a 56 y.o. male with below list of chronic medical conditions including Diverticulitis presents to the emergency department with acute onset of left lower quadrant abdominal pain which is currently 10 out of 10 associated with nausea and vomiting.  Patient admits to subjective fevers however afebrile on presentation.   Past Medical History:  Diagnosis Date  . Anemia of chronic disease    BL Hgb ~10  . CHF (congestive heart failure) ([place])    pt denies 04/01/14  . Diverticulitis   . End-stage renal disease on hemodialysis ([place])    HD m/w/f - right arm graft  . History of blood transfusion   . Hypertension   . Pneumonia    hx of  . Pneumonia   . Polysubstance abuse ([place])   . Renal insufficiency   . Secondary hyperparathyroidism ([place])   . Sleep disturbance   . Tobacco abuse     Patient Active Problem List   Diagnosis Date Noted  . Diverticulitis large intestine 12/05/2017  . Diverticulitis of colon 04/24/2015  . Community acquired pneumonia 04/18/2015  . Sepsis ([place]) 04/16/2015  . S/P thoracotomy 03/04/2013  . 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 Bacteremia 01/06/2013  . Benign neoplasm of colon 12/16/2012  . Diverticulosis of colon (without mention of hemorrhage) 12/16/2012  . ESRD (end stage renal disease) ([place]) 12/12/2012  . H/O: hypertension 12/12/2012  . Pleural effusion, right 12/12/2012  . Hyperkalemia 12/11/2012  . Acute blood loss anemia 12/11/2012  . Sinus tachycardia, resolved with PRBC resuscitation 12/11/2012  . Lower GI bleed 12/11/2012  . End-stage renal disease on hemodialysis ([place])   . Secondary hyperparathyroidism ([place])   . Hypertension   . Polysubstance  abuse ([place])   . Anemia of chronic disease   . Tobacco abuse     Past Surgical History:  Procedure Laterality Date  . APPENDECTOMY    . CHEST TUBE INSERTION  02/2013  . COLONOSCOPY  12/16/2012   Procedure: COLONOSCOPY;  Surgeon: Milus Banister, MD;  Location: [place];  Service: Endoscopy;  Laterality: N/A;  . PLEURAL EFFUSION DRAINAGE  12/18/2012   Procedure: DRAINAGE OF PLEURAL EFFUSION;  Surgeon: Melrose Nakayama, MD;  Location: [place];  Service: Thoracic;  Laterality: Right;  . surgery for horseshoe kidney    . TALC PLEURODESIS Right 02/05/2013   Procedure: Pietro Cassis;  Surgeon: Melrose Nakayama, MD;  Location: [place];  Service: Thoracic;  Laterality: Right;  . TEE WITHOUT CARDIOVERSION N/A 01/16/2013   Procedure: TRANSESOPHAGEAL ECHOCARDIOGRAM (TEE);  Surgeon: Sanda Klein, MD;  Location: [facility] ENDOSCOPY;  Service: Cardiovascular;  Laterality: N/A;  . VIDEO ASSISTED THORACOSCOPY  12/18/2012   Procedure: VIDEO ASSISTED THORACOSCOPY;  Surgeon: Melrose Nakayama, MD;  Location: [place];  Service: Thoracic;  Laterality: Right;  pleural peel  . VIDEO ASSISTED THORACOSCOPY (VATS)/THOROCOTOMY Right 02/19/2013   Procedure: VIDEO ASSISTED THORACOSCOPY (VATS)/THOROCOTOMY;  Surgeon: Melrose Nakayama, MD;  Location: [place];  Service: Thoracic;  Laterality: Right;  Right Video Assisted Thoracoscopy, Right Thoracotomy, Decortication,   . VIDEO BRONCHOSCOPY WITH INSERTION OF INTERBRONCHIAL VALVE (IBV)  01/09/2013   Procedure: VIDEO BRONCHOSCOPY WITH INSERTION OF INTERBRONCHIAL VALVE (IBV);  Surgeon: Melrose Nakayama, MD;  [facility];  Service: Thoracic;  Laterality: N/A;  placement of 2 Intrabronchial valves in right lower lobe  . VIDEO BRONCHOSCOPY WITH INSERTION OF INTERBRONCHIAL VALVE (IBV) Right 02/19/2013   Procedure: VIDEO BRONCHOSCOPY WITH INSERTION OF INTERBRONCHIAL VALVE (IBV);  Surgeon: Melrose Nakayama, MD;  Location: [place];  Service: Thoracic;  Laterality: Right;   removal of two interbronchial valves right chest    Prior to Admission medications   Medication Sig Start Date End Date Taking? Authorizing Provider  acetaminophen (TYLENOL) 500 MG tablet Take 1,000 mg by mouth every 8 (eight) hours as needed for moderate pain.   Yes [provider]  Bismuth Subsalicylate (KAOPECTATE PO) Take 15 mLs by mouth daily as needed (for nausea).   Yes [provider]  calcium acetate (PHOSLO) 667 MG capsule Take 2,668 mg by mouth 3 (three) times daily with meals.    Yes [provider]  cinacalcet (SENSIPAR) 90 MG tablet Take 90 mg by mouth daily.    [provider]  metoprolol succinate (TOPROL-XL) 50 MG 24 hr tablet Take 50 mg by mouth daily. Take with or immediately following a meal.    [provider]  ondansetron (ZOFRAN) 4 MG tablet Take 4 mg by mouth every 8 (eight) hours as needed. 04/21/15   [provider]  zolpidem (AMBIEN CR) 6.25 MG CR tablet Take 6.25 mg by mouth at bedtime as needed for sleep.    [provider]    Allergies Tape  Family History  Problem Relation Age of Onset  . Thyroid disease Mother   . Renal Disease Maternal Grandmother   . Diabetes Maternal Grandmother   . Hypertension Maternal Aunt   . Hypertension Maternal Uncle     Social History Social History   Tobacco Use  . Smoking status: Current Every Day Smoker    Packs/day: 6.00    Years: 20.00    Pack years: 120.00    Types: Cigarettes  . Smokeless tobacco: Current User  Substance Use Topics  . Alcohol use: Yes    Alcohol/week: 1.8 oz    Types: 3 Cans of beer per week    Comment: occasional  . Drug use: Yes    Types: Marijuana    Comment: remote cocaine abuse, ongoing marijuana use.    Review of Systems Constitutional: No fever/chills Eyes: No visual changes. ENT: No sore throat. Cardiovascular: Denies chest pain. Respiratory: Denies shortness of breath. Gastrointestinal: Positive abdominal pain.   No nausea, no vomiting.  No diarrhea.  No constipation. Genitourinary: Negative for dysuria. Musculoskeletal: Negative for neck pain.  Negative for back pain. Integumentary: Negative for rash. Neurological: Negative for headaches, focal weakness or numbness.   ____________________________________________   PHYSICAL EXAM:  VITAL SIGNS: ED Triage Vitals  Enc Vitals Group     BP 12/05/17 0113 (!) 194/92     Pulse Rate 12/05/17 0113 77     Resp 12/05/17 0113 (!) 24     Temp 12/05/17 0113 98.9 F (37.2 C)     Temp Source 12/05/17 0113 Oral     SpO2 12/05/17 0113 100 %     Weight 12/05/17 0105 103 kg (227 lb 1.2 oz)     Height 12/05/17 0105 1.854 m (6\' 1" )     Head Circumference --      Peak Flow --      Pain Score 12/05/17 0104 10     Pain Loc --      Pain Edu? --  Excl. in [place]? --     Constitutional: Alert and oriented. Apparent discomfort Eyes: Conjunctivae are normal. Head: Atraumatic. Mouth/Throat: Mucous membranes are moist.  Oropharynx non-erythematous. Neck: No stridor.   Cardiovascular: Normal rate, regular rhythm. Good peripheral circulation. Grossly normal heart sounds. Respiratory: Normal respiratory effort.  No retractions. Lungs CTAB. Gastrointestinal: Left lower quadrant tenderness to palpation.. No distention.  Musculoskeletal: No lower extremity tenderness nor edema. No gross deformities of extremities. Neurologic:  Normal speech and language. No gross focal neurologic deficits are appreciated.  Skin:  Skin is warm, dry and intact. No rash noted. Psychiatric: Mood and affect are normal. Speech and behavior are normal.  ____________________________________________   LABS (all labs ordered are listed, but only abnormal results are displayed)  Labs Reviewed  CBC WITH DIFFERENTIAL/PLATELET - Abnormal; Notable for the following components:      Result Value   RBC 3.90 (*)    Hemoglobin 11.7 (*)    HCT 37.2 (*)    MCHC 31.5 (*)    RDW 15.1 (*)     Platelets 94 (*)    Lymphs Abs 0.5 (*)    All other components within normal limits  COMPREHENSIVE METABOLIC PANEL - Abnormal; Notable for the following components:   Chloride 97 (*)    Glucose, Bld 106 (*)    BUN 40 (*)    Creatinine, Ser 11.83 (*)    Calcium 10.4 (*)    ALT 10 (*)    GFR calc non Af Amer 4 (*)    GFR calc Af Amer 5 (*)    All other components within normal limits  LIPASE, BLOOD - Abnormal; Notable for the following components:   Lipase 59 (*)    All other components within normal limits    RADIOLOGY I, Kidron N BROWN, personally viewed and evaluated these images (plain radiographs) as part of my medical decision making, as well as reviewing the written report by the radiologist.  Ct Abdomen Pelvis Wo Contrast  Result Date: 12/05/2017 CLINICAL DATA:  56 y/o M; 3 days of lower abdominal pain with nausea and vomiting. EXAM: CT ABDOMEN AND PELVIS WITHOUT CONTRAST TECHNIQUE: Multidetector CT imaging of the abdomen and pelvis was performed following the standard protocol without IV contrast. COMPARISON:  01/20/2017 CT abdomen and pelvis FINDINGS: Lower chest: No acute abnormality. Hepatobiliary: No focal liver abnormality is seen. No gallstones, gallbladder wall thickening, or biliary dilatation. Pancreas: Unremarkable. No pancreatic ductal dilatation or surrounding inflammatory changes. Spleen: Normal in size without focal abnormality. Adrenals/Urinary Tract: Atrophic horseshoe kidney with multiple cysts. Normal adrenal glands. No hydronephrosis. Mild bladder wall thickening, probably reactive inflammation due to adjacent diverticulitis. Stomach/Bowel: Acute sigmoid diverticulitis. Punctate foci of extraluminal air present in pericolonic fat (series 2, image 72) compatible microperforation. No abscess. Otherwise small and large bowel are unremarkable. Vascular/Lymphatic: Aortic atherosclerosis. No enlarged abdominal or pelvic lymph nodes. Numerous venous collaterals throughout  the subcutaneous fat of the torso. Reproductive: Prostate is unremarkable. Other: Ventral abdominal hernia 3.8 cm superior to the umbilicus with 3.2 cm neck containing fat. Musculoskeletal: Bone sclerosis compatible with renal osteodystrophy. No acute fracture. IMPRESSION: 1. Acute sigmoid diverticulitis. Punctate foci of extraluminal air compatible with microperforation. No abscess. 2. Atrophic horseshoe kidney. 3. Ventral abdominal hernia containing fat superior to umbilicus. 4. Bony stigmata of renal osteodystrophy. 5. Aortic atherosclerosis. 6. Numerous venous collaterals throughout subcutaneous fat of torso. Electronically Signed   By: Kristine Garbe M.D.   On: 12/05/2017 03:26     Procedures  ____________________________________________   INITIAL IMPRESSION / ASSESSMENT AND PLAN / ED COURSE  As part of my medical decision making, I reviewed the following data within the electronic MEDICAL RECORD [id number] year old male presented with a history and physical exam concerning for acute diverticulitis which was confirmed on CT scan.  CT revealed diverticulitis with evidence of microperforation.  Patient given IV Zosyn 3.375g.  Patient discussed with Dr. Dahlia Byes general surgeon on-call who will admit the patient.  Patient also discussed with Dr. Marcille Blanco hospitalist on call who will perform consultation on the patient. ____________________________________________  FINAL CLINICAL IMPRESSION(S) / ED DIAGNOSES  Final diagnoses:  Diverticulitis of large intestine with perforation without bleeding     MEDICATIONS GIVEN DURING THIS VISIT:  Medications  piperacillin-tazobactam (ZOSYN) IVPB 3.375 g (not administered)  morphine 4 MG/ML injection 4 mg (4 mg Intravenous Given 12/05/17 0136)  ondansetron (ZOFRAN) injection 4 mg (4 mg Intravenous Given 12/05/17 0136)  ondansetron (ZOFRAN) injection 4 mg (4 mg Intravenous Given 12/05/17 0145)     ED Discharge Orders    None       Note:   This document was prepared using Dragon voice recognition software and may include unintentional dictation errors.    Gregor Hams, MD 12/05/17 [phone]

## 2017-12-05 NOTE — ED Triage Notes (Signed)
Patient ambulatory to triage with steady gait, without difficulty or distress noted; pt reports lower abd pain x 3 days accomp by N/V; st hx diverticulitis

## 2017-12-05 NOTE — ED Notes (Signed)
Pt to the ER for trichomonas. Pt says his stomach is hurting and is vomitting x 3 days. Pt is coughing and spitting in the bag. Pt says he had a blockage in his chest so he got some miralax and pt said he was able to go to the bathroom. Pt states he drank a little bit but he threw it up. Pt is moaning constantly and is hard to get history.

## 2017-12-05 NOTE — H&P (Signed)
Patient ID: [name] [name], male   DOB: 06-Apr-1961, 56 y.o.   MRN: [id number]  HPI [name] [name] is a 55 y.o. male  Patient has history of end-stage renal disease on HD, has history of congestive heart failure, anemia of chronic disease, polysubstance abuse, secondary hyperparathyroidism, hypertension, and had a complicated course about 3 years ago with a recurrent right pleural effusion which required a VATS decortication which was then complicated by a bronchopleural . What I can gather from start he has recurrent diverticulitis but hasn't required any major surgical intervention. Came in with an acute onset of left lower quadrant pain that is moderate to severe intensity and sharp in nature. He has oscillating nausea and vomiting. Apparently some chills. No melena. He gets hemodialysis Monday was and Fridays. CT scan personal review there is evidence of acute with diverticulitis with some foci of extraluminal air compatible with microperforation. No overt free air. No abscess. There is also a ventral hernia. He does have some caput medusa and collaterals throughout h his abdominal wall suggestive of portal hypertension  WBC 6, PL 94. Hb 11.7 TB .8, AP 66. Alb 4    HPI  Past Medical History:  Diagnosis Date  . Anemia of chronic disease    BL Hgb ~10  . CHF (congestive heart failure) ([place])    pt denies 04/01/14  . Diverticulitis   . End-stage renal disease on hemodialysis ([place])    HD m/w/f - right arm graft  . History of blood transfusion   . Hypertension   . Pneumonia    hx of  . Pneumonia   . Polysubstance abuse ([place])   . Renal insufficiency   . Secondary hyperparathyroidism ([place])   . Sleep disturbance   . Tobacco abuse     Past Surgical History:  Procedure Laterality Date  . APPENDECTOMY    . CHEST TUBE INSERTION  02/2013  . COLONOSCOPY  12/16/2012   Procedure: COLONOSCOPY;  Surgeon: Milus Banister, MD;  Location: [place];  Service: Endoscopy;  Laterality: N/A;  .  PLEURAL EFFUSION DRAINAGE  12/18/2012   Procedure: DRAINAGE OF PLEURAL EFFUSION;  Surgeon: Melrose Nakayama, MD;  Location: [place];  Service: Thoracic;  Laterality: Right;  . surgery for horseshoe kidney    . TALC PLEURODESIS Right 02/05/2013   Procedure: Pietro Cassis;  Surgeon: Melrose Nakayama, MD;  Location: [place];  Service: Thoracic;  Laterality: Right;  . TEE WITHOUT CARDIOVERSION N/A 01/16/2013   Procedure: TRANSESOPHAGEAL ECHOCARDIOGRAM (TEE);  Surgeon: Sanda Klein, MD;  Location: [facility] ENDOSCOPY;  Service: Cardiovascular;  Laterality: N/A;  . VIDEO ASSISTED THORACOSCOPY  12/18/2012   Procedure: VIDEO ASSISTED THORACOSCOPY;  Surgeon: Melrose Nakayama, MD;  Location: [place];  Service: Thoracic;  Laterality: Right;  pleural peel  . VIDEO ASSISTED THORACOSCOPY (VATS)/THOROCOTOMY Right 02/19/2013   Procedure: VIDEO ASSISTED THORACOSCOPY (VATS)/THOROCOTOMY;  Surgeon: Melrose Nakayama, MD;  Location: [place];  Service: Thoracic;  Laterality: Right;  Right Video Assisted Thoracoscopy, Right Thoracotomy, Decortication,   . VIDEO BRONCHOSCOPY WITH INSERTION OF INTERBRONCHIAL VALVE (IBV)  01/09/2013   Procedure: VIDEO BRONCHOSCOPY WITH INSERTION OF INTERBRONCHIAL VALVE (IBV);  Surgeon: Melrose Nakayama, MD;  Location: [place];  Service: Thoracic;  Laterality: N/A;  placement of 2 Intrabronchial valves in right lower lobe  . VIDEO BRONCHOSCOPY WITH INSERTION OF INTERBRONCHIAL VALVE (IBV) Right 02/19/2013   Procedure: VIDEO BRONCHOSCOPY WITH INSERTION OF INTERBRONCHIAL VALVE (IBV);  Surgeon: Melrose Nakayama, MD;  Location: [place];  Service: Thoracic;  Laterality: Right;  removal of two interbronchial valves right chest    Family History  Problem Relation Age of Onset  . Thyroid disease Mother   . Renal Disease Maternal Grandmother   . Diabetes Maternal Grandmother   . Hypertension Maternal Aunt   . Hypertension Maternal Uncle     Social History Social History   Tobacco Use  .  Smoking status: Current Every Day Smoker    Packs/day: 6.00    Years: 20.00    Pack years: 120.00    Types: Cigarettes  . Smokeless tobacco: Current User  Substance Use Topics  . Alcohol use: Yes    Alcohol/week: 1.8 oz    Types: 3 Cans of beer per week    Comment: occasional  . Drug use: Yes    Types: Marijuana    Comment: remote cocaine abuse, ongoing marijuana use.    Allergies  Allergen Reactions  . Tape Other (See Comments)    Plastic Tape Causes blister    Current Facility-Administered Medications  Medication Dose Route Frequency Provider Last Rate Last Dose  . hydrALAZINE (APRESOLINE) injection 10 mg  10 mg Intravenous Q6H PRN Harrie Foreman, MD   10 mg at 12/05/17 0556  . piperacillin-tazobactam (ZOSYN) IVPB 3.375 g  3.375 g Intravenous Once Gregor Hams, MD       Current Outpatient Medications  Medication Sig Dispense Refill  . acetaminophen (TYLENOL) 500 MG tablet Take 1,000 mg by mouth every 8 (eight) hours as needed for moderate pain.    . Bismuth Subsalicylate (KAOPECTATE PO) Take 15 mLs by mouth daily as needed (for nausea).    . calcium acetate (PHOSLO) 667 MG capsule Take 2,668 mg by mouth 3 (three) times daily with meals.     . cinacalcet (SENSIPAR) 90 MG tablet Take 90 mg by mouth daily.    . metoprolol succinate (TOPROL-XL) 50 MG 24 hr tablet Take 50 mg by mouth daily. Take with or immediately following a meal.    . ondansetron (ZOFRAN) 4 MG tablet Take 4 mg by mouth every 8 (eight) hours as needed.    . zolpidem (AMBIEN CR) 6.25 MG CR tablet Take 6.25 mg by mouth at bedtime as needed for sleep.       Review of Systems Full ROS  was asked and was negative except for the information on the HPI  Physical Exam Blood pressure (!) 174/92, pulse 90, temperature 98.9 F (37.2 C), temperature source Oral, resp. rate (!) 24, height 6\' 1"  (1.854 m), weight 103 kg (227 lb 1.2 oz), SpO2 96 %. CONSTITUTIONAL: NAD chronically ill EYES: Pupils are equal,  round, and reactive to light, Sclera are non-icteric. EARS, NOSE, MOUTH AND THROAT: The oropharynx is clear. The oral mucosa is pink and moist. Hearing is intact to voice. LYMPH NODES:  Lymph nodes in the neck are normal. RESPIRATORY:  Lungs are clear. There is normal respiratory effort, with equal breath sounds bilaterally, and without pathologic use of accessory muscles. CARDIOVASCULAR: Heart is regular without murmurs, gallops, or rubs. GI: The abdomen is  Soft,TTP LLQ, no peritonitis or rebound. Reducible UH. GU: Rectal deferred.   MUSCULOSKELETAL: Normal muscle strength and tone. No cyanosis or edema.   SKIN: Turgor is good and there are no pathologic skin lesions or ulcers. NEUROLOGIC: Motor and sensation is grossly normal. Cranial nerves are grossly intact. PSYCH:  Oriented to person, place and time. Affect is normal Right AVF w good thrill.  Data Reviewed  I  have personally reviewed the patient's imaging, laboratory findings and medical records.    Assessment/Plan  56 year old male with a complicated history and renal failure on hemodialysis presented with recurrent diverticulitis and microperforation. Currently no evidence of peritonitis. I'm concerned about his caput medusa and about the possibility of him having some degree of portal hypertension. We will admit to hospital place him IV antibiotics, nothing by mouth serial abdominal exams and consult nephrology as well as hospitalist service. I will also investigate the portal hypertension and order an ultrasound of the liver and the portal system. I'll repeat serial labs including LFTs, INR. The need for emergent surgical revision at this time. He understands that he may he worsen and may require surgical intervention. Case discussed with Dr. Owens Shark in detail.  Caroleen Hamman, MD FACS General Surgeon 12/05/2017, 6:35 AM

## 2017-12-06 LAB — BASIC METABOLIC PANEL
Anion gap: 13 (ref 5–15)
BUN: 64 mg/dL — AB (ref 6–20)
CALCIUM: 9.8 mg/dL (ref 8.9–10.3)
CO2: 25 mmol/L (ref 22–32)
Chloride: 99 mmol/L — ABNORMAL LOW (ref 101–111)
Creatinine, Ser: 15.16 mg/dL — ABNORMAL HIGH (ref 0.61–1.24)
GFR calc Af Amer: 4 mL/min — ABNORMAL LOW (ref >60)
GFR, EST NON AFRICAN AMERICAN: 3 mL/min — AB (ref >60)
GLUCOSE: 73 mg/dL (ref 65–99)
Potassium: 5.1 mmol/L (ref 3.5–5.1)
Sodium: 137 mmol/L (ref 135–145)

## 2017-12-06 LAB — CBC
HCT: 34.3 % — ABNORMAL LOW (ref 40.0–52.0)
Hemoglobin: 11 g/dL — ABNORMAL LOW (ref 13.0–18.0)
MCH: 30.4 pg (ref 26.0–34.0)
MCHC: 32 g/dL (ref 32.0–36.0)
MCV: 94.8 fL (ref 80.0–100.0)
PLATELETS: 99 10*3/uL — AB (ref 150–440)
RBC: 3.62 MIL/uL — ABNORMAL LOW (ref 4.40–5.90)
RDW: 15.3 % — AB (ref 11.5–14.5)
WBC: 5.3 10*3/uL (ref 3.8–10.6)

## 2017-12-06 LAB — HIV ANTIBODY (ROUTINE TESTING W REFLEX): HIV SCREEN 4TH GENERATION: NONREACTIVE

## 2017-12-06 MED ORDER — AMOXICILLIN-POT CLAVULANATE 500-125 MG PO TABS
1.0000 | ORAL_TABLET | Freq: Every day | ORAL | Status: DC
  Filled 2017-12-06: qty 1

## 2017-12-06 MED ORDER — METOPROLOL SUCCINATE ER 50 MG PO TB24
50.0000 mg | ORAL_TABLET | Freq: Every day | ORAL | Status: DC
  Administered 2017-12-07: 50 mg via ORAL
  Filled 2017-12-06: qty 1

## 2017-12-06 MED ORDER — AMOXICILLIN-POT CLAVULANATE 500-125 MG PO TABS
1.0000 | ORAL_TABLET | Freq: Every day | ORAL | Status: DC
  Administered 2017-12-06: 500 mg via ORAL
  Filled 2017-12-06 (×2): qty 1

## 2017-12-06 NOTE — Progress Notes (Signed)
Hemodialysis treatment post assessment

## 2017-12-06 NOTE — Progress Notes (Signed)
Pre HD  

## 2017-12-06 NOTE — Progress Notes (Addendum)
[facility] NAME: [name] [name]    MR#:  [id number]  DATE OF BIRTH:  17-Mar-1961  SUBJECTIVE:  CHIEF COMPLAINT:   Chief Complaint  Patient presents with  . Abdominal Pain   No complaints.  The patient is hungry and wants to eat. REVIEW OF SYSTEMS:  Review of Systems  Constitutional: Negative for chills, fever and malaise/fatigue.  HENT: Negative for sore throat.   Eyes: Negative for blurred vision and double vision.  Respiratory: Negative for cough, hemoptysis, shortness of breath, wheezing and stridor.   Cardiovascular: Negative for chest pain, palpitations, orthopnea and leg swelling.  Gastrointestinal: Negative for abdominal pain, blood in stool, diarrhea, melena, nausea and vomiting.  Genitourinary: Negative for dysuria, flank pain and hematuria.  Musculoskeletal: Negative for back pain and joint pain.  Neurological: Negative for dizziness, sensory change, focal weakness, seizures, loss of consciousness, weakness and headaches.  Endo/Heme/Allergies: Negative for polydipsia.  Psychiatric/Behavioral: Negative for depression. The patient is not nervous/anxious.     DRUG ALLERGIES:   Allergies  Allergen Reactions  . Tape Other (See Comments)    Plastic Tape Causes blister   VITALS:  Blood pressure (!) 156/70, pulse 71, temperature 98.2 F (36.8 C), temperature source Oral, resp. rate 20, height 6\' 1"  (1.854 m), weight 223 lb 12.3 oz (101.5 kg), SpO2 96 %. PHYSICAL EXAMINATION:  Physical Exam  Constitutional: He is oriented to person, place, and time and well-developed, well-nourished, and in no distress.  HENT:  Head: Normocephalic.  Mouth/Throat: Oropharynx is clear and moist.  Eyes: Conjunctivae and EOM are normal. Pupils are equal, round, and reactive to light. No scleral icterus.  Neck: Normal range of motion. Neck supple. No JVD present. No tracheal deviation present.  Cardiovascular: Normal rate, regular rhythm  and normal heart sounds. Exam reveals no gallop.  No murmur heard. Pulmonary/Chest: Effort normal and breath sounds normal. No respiratory distress. He has no wheezes. He has no rales.  Abdominal: Soft. Bowel sounds are normal. He exhibits no distension. There is no tenderness. There is no rebound.  Musculoskeletal: Normal range of motion. He exhibits no edema or tenderness.  Neurological: He is alert and oriented to person, place, and time. No cranial nerve deficit.  Skin: No rash noted. No erythema.  Psychiatric: Affect normal.   LABORATORY PANEL:  Male CBC Recent Labs  Lab 12/06/17 0336  WBC 5.3  HGB 11.0*  HCT 34.3*  PLT 99*   ------------------------------------------------------------------------------------------------------------------ Chemistries  Recent Labs  Lab 12/05/17 0724 12/06/17 0336  NA 137 137  K 5.5* 5.1  CL 100* 99*  CO2 27 25  GLUCOSE 101* 73  BUN 44* 64*  CREATININE 12.80* 15.16*  CALCIUM 10.0 9.8  MG 2.0  --   AST 15  --   ALT 10*  --   ALKPHOS 59  --   BILITOT 1.1  --    RADIOLOGY:  No results found. ASSESSMENT AND PLAN:   This is a 56 year old male with diverticulitis and perforation of his large intestine.  He has uncontrolled hypertension and needs management of his pain and nausea.  1.  Hypertension.  Better controlled, resume Toprol, hydralazine as needed.  Discontinued Lopressor IV every 6 hours.  2.  Diverticulitis with perforation: Punctate foci of extraluminal air.    He has been treated with Zosyn.  Anticipatory management by surgery at this time.  Manage pain and nausea.  Start full liquid diet per Dr. Burt Knack.  3.  ESRD:  On dialysis.    Hemodialysis as scheduled. Continue PhosLo and Sensipar for renal osteodystrophy.  Discussed with Dr. Burt Knack. Medically stable. All the records are reviewed and case discussed with Care Management/Social Worker. Management plans discussed with the patient, family and they are in  agreement.  CODE STATUS: Full Code  TOTAL TIME TAKING CARE OF THIS PATIENT: 26 minutes.   More than 50% of the time was spent in counseling/coordination of care: YES  POSSIBLE D/C today.   Demetrios Loll M.D on 12/06/2017 at 12:47 PM  Between 7am to 6pm - Pager - [phone]  After 6pm go to [email or web address] - [facility]

## 2017-12-06 NOTE — Progress Notes (Signed)
[place], [place] 12/06/17  Subjective:   Patient known to our practice from outpatient dialysis.  He states that he presents for abdominal pain that is moderate to severe.  He states he has not had anything much to eat for the past 2-3 days. CT scan of the abdomen shows acute sigmoid diverticulitis compatible with microperforation.  No abscess.  Patient seen during dialysis Tolerating well   HEMODIALYSIS FLOWSHEET:  Blood Flow Rate (mL/min): 380 mL/min Arterial Pressure (mmHg): -120 mmHg Venous Pressure (mmHg): 270 mmHg Transmembrane Pressure (mmHg): 60 mmHg Ultrafiltration Rate (mL/min): 290 mL/min Dialysate Flow Rate (mL/min): 800 ml/min Conductivity: Machine : 13.9 Conductivity: Machine : 13.9  Asking about food and going home Abdominal pain has improved  Objective:  Vital signs in last 24 hours:  Temp:  [98.2 F (36.8 C)-98.8 F (37.1 C)] 98.2 F (36.8 C) (12/28 1021) Pulse Rate:  [62-83] 71 (12/28 1145) Resp:  [15-22] 20 (12/28 1145) BP: (131-173)/(61-92) 156/70 (12/28 1130) SpO2:  [95 %-100 %] 96 % (12/28 1145) Weight:  [98.5 kg (217 lb 1.6 oz)-101.5 kg (223 lb 12.3 oz)] 101.5 kg (223 lb 12.3 oz) (12/28 1021)  Weight change: -4.524 kg (-15.6 oz) Filed Weights   12/05/17 0650 12/06/17 0614 12/06/17 1021  Weight: 103 kg (227 lb 1.2 oz) 98.5 kg (217 lb 1.6 oz) 101.5 kg (223 lb 12.3 oz)    Intake/Output:    Intake/Output Summary (Last 24 hours) at 12/06/2017 1217 Last data filed at 12/06/2017 0400 Gross per 24 hour  Intake 1138.33 ml  Output -  Net 1138.33 ml     Physical Exam: General: No acute distress,   HEENT anicteric  Neck supple  Pulm/lungs clear to auscultation bilaterally  CVS/Heart Regular rhythm, no murmur  Abdomen:  Soft, nontender  Extremities: No edema  Neurologic: Alert, oriented  Skin: No acute rashes  Access: AV fistula, good thrill       Basic Metabolic Panel:  Recent Labs  Lab 12/05/17 0137  12/05/17 0724 12/06/17 0336  NA 137 137 137  K 4.7 5.5* 5.1  CL 97* 100* 99*  CO2 28 27 25   GLUCOSE 106* 101* 73  BUN 40* 44* 64*  CREATININE 11.83* 12.80* 15.16*  CALCIUM 10.4* 10.0 9.8  MG  --  2.0  --   PHOS  --  6.4*  --      CBC: Recent Labs  Lab 12/05/17 0137 12/05/17 0724 12/06/17 0336  WBC 6.0 5.7 5.3  NEUTROABS 4.9  --   --   HGB 11.7* 11.8* 11.0*  HCT 37.2* 36.9* 34.3*  MCV 95.5 95.7 94.8  PLT 94* 91* 99*      Lab Results  Component Value Date   HEPBSAG NEGATIVE 02/20/2013      Microbiology:  Recent Results (from the past 240 hour(s))  MRSA PCR Screening     Status: None   Collection Time: 12/05/17  1:29 PM  Result Value Ref Range Status   MRSA by PCR NEGATIVE NEGATIVE Final    Comment:        The GeneXpert MRSA Assay (FDA approved for NASAL specimens only), is one component of a comprehensive MRSA colonization surveillance program. It is not intended to diagnose MRSA infection nor to guide or monitor treatment for MRSA infections. Performed at [facility], [address] [place], [place] [address]     Coagulation Studies: Recent Labs    12/05/17 0724  LABPROT 14.5  INR 1.14    Urinalysis: No  results for input(s): COLORURINE, LABSPEC, [place], GLUCOSEU, HGBUR, BILIRUBINUR, KETONESUR, PROTEINUR, UROBILINOGEN, NITRITE, LEUKOCYTESUR in the last 72 hours.  Invalid input(s): APPERANCEUR    Imaging: Ct Abdomen Pelvis Wo Contrast  Result Date: 12/05/2017 CLINICAL DATA:  56 y/o M; 3 days of lower abdominal pain with nausea and vomiting. EXAM: CT ABDOMEN AND PELVIS WITHOUT CONTRAST TECHNIQUE: Multidetector CT imaging of the abdomen and pelvis was performed following the standard protocol without IV contrast. COMPARISON:  01/20/2017 CT abdomen and pelvis FINDINGS: Lower chest: No acute abnormality. Hepatobiliary: No focal liver abnormality is seen. No gallstones, gallbladder wall thickening, or biliary dilatation. Pancreas:  Unremarkable. No pancreatic ductal dilatation or surrounding inflammatory changes. Spleen: Normal in size without focal abnormality. Adrenals/Urinary Tract: Atrophic horseshoe kidney with multiple cysts. Normal adrenal glands. No hydronephrosis. Mild bladder wall thickening, probably reactive inflammation due to adjacent diverticulitis. Stomach/Bowel: Acute sigmoid diverticulitis. Punctate foci of extraluminal air present in pericolonic fat (series 2, image 72) compatible microperforation. No abscess. Otherwise small and large bowel are unremarkable. Vascular/Lymphatic: Aortic atherosclerosis. No enlarged abdominal or pelvic lymph nodes. Numerous venous collaterals throughout the subcutaneous fat of the torso. Reproductive: Prostate is unremarkable. Other: Ventral abdominal hernia 3.8 cm superior to the umbilicus with 3.2 cm neck containing fat. Musculoskeletal: Bone sclerosis compatible with renal osteodystrophy. No acute fracture. IMPRESSION: 1. Acute sigmoid diverticulitis. Punctate foci of extraluminal air compatible with microperforation. No abscess. 2. Atrophic horseshoe kidney. 3. Ventral abdominal hernia containing fat superior to umbilicus. 4. Bony stigmata of renal osteodystrophy. 5. Aortic atherosclerosis. 6. Numerous venous collaterals throughout subcutaneous fat of torso. Electronically Signed   By: Kristine Garbe M.D.   On: 12/05/2017 03:26   US Liver Doppler  Result Date: 12/05/2017 CLINICAL DATA:  56 year old male with a history of superior vena cava obstruction, abdominal wall collaterals EXAM: DUPLEX ULTRASOUND OF LIVER TECHNIQUE: Color and duplex Doppler ultrasound was performed to evaluate the hepatic in-flow and out-flow vessels. COMPARISON:  None. FINDINGS: Portal Vein Velocities Main:  56 cm/sec Right:  38 cm/sec Left:  45 cm/sec Hepatic Vein Velocities Right:  15 cm/sec Middle:  45 cm/sec Left:  14 cm/sec Hepatic Artery Velocity:  127 cm/sec Splenic Vein Velocity:  22 cm/sec  Varices: No varices. Ascites: No ascites IMPRESSION: Unremarkable hepatic Doppler study. Electronically Signed   By: Corrie Mckusick D.O.   On: 12/05/2017 10:18     Medications:   . sodium chloride Stopped (12/06/17 0400)  . piperacillin-tazobactam (ZOSYN)  IV 3.375 g (12/06/17 0830)   . heparin  5,000 Units Subcutaneous Q8H  . metoprolol succinate  50 mg Oral Daily  . pantoprazole (PROTONIX) IV  40 mg Intravenous QHS  . zolpidem  5 mg Oral QHS   acetaminophen, hydrALAZINE, hydrALAZINE, morphine injection, ondansetron **OR** ondansetron (ZOFRAN) IV, oxyCODONE  Assessment/ Plan:  56 y.o. male  with ESRD, hypertension, SHPTH, anemia, right sided pleural effusion s/p talc pleurodesis, right chest tube placement, right chest decortication   [facility]/ [facility]/MWF  1.  ESRD 2.  Anemia of chronic kidney disease 3.  Secondary hyperparathyroidism 4.  Acute diverticulitis  Patient seen during dialysis Tolerating well  We will continue Procrit with dialysis for anemia Administer calcium acetate and Sensipar with meals only Restart when able to eat normal diet    LOS: 1 Kailynn Satterly 12/28/201812:17 PM  [place], [place]

## 2017-12-06 NOTE — Progress Notes (Signed)
Pharmacy Antibiotic Note  [name] [name] is a 56 y.o. male admitted on 12/05/2017 with diverticulitis. Pharmacy has been consulted for Augmentin dosing.  Patient transitioning from Zosyn to Augmentin. Hemodialysis patient  Plan: Will continue Augmentin 500 mg PO Q24h (give dose after dialysis on dialysis days)    Height: 6\' 1"  (185.4 cm) Weight: 223 lb 12.3 oz (101.5 kg) IBW/kg (Calculated) : 79.9  Temp (24hrs), Avg:98.4 F (36.9 C), Min:98.2 F (36.8 C), Max:98.8 F (37.1 C)  Recent Labs  Lab 12/05/17 0137 12/05/17 0724 12/06/17 0336  WBC 6.0 5.7 5.3  CREATININE 11.83* 12.80* 15.16*    Estimated Creatinine Clearance: 7 mL/min (A) (by C-G formula based on SCr of 15.16 mg/dL (H)).    Allergies  Allergen Reactions  . Tape Other (See Comments)    Plastic Tape Causes blister    Antimicrobials this admission: Zosyn 12/27 >>  12/28 augmentin 12/28 >>    Dose adjustments this admission:    Microbiology results:   BCx:     UCx:      Sputum:    12/27 MRSA PCR: neg  Thank you for allowing pharmacy to be a part of this patient's care.  Kerilyn Cortner A 12/06/2017 7:48 PM

## 2017-12-06 NOTE — Progress Notes (Signed)
12/06/2017  Subjective: No acute events.  Patient had dialysis today without issues.  Reports he continues to feel better.  His diet was advanced to full liquids this morning and has tolerated well.  Denies any worsening pain, nausea, or vomiting.  Vital signs: Temp:  [98.2 F (36.8 C)-98.8 F (37.1 C)] 98.3 F (36.8 C) (12/28 1430) Pulse Rate:  [62-93] 87 (12/28 1445) Resp:  [11-25] 25 (12/28 1445) BP: (110-173)/(55-87) 129/55 (12/28 1450) SpO2:  [94 %-100 %] 94 % (12/28 1400) Weight:  [98.5 kg (217 lb 1.6 oz)-101.5 kg (223 lb 12.3 oz)] 101.5 kg (223 lb 12.3 oz) (12/28 1021)   Intake/Output: 12/27 0701 - 12/28 0700 In: 1138.3 [I.V.:1038.3; IV Piggyback:100] Out: -  Last BM Date: 12/04/17  Physical Exam: Constitutional: No acute distress Abdomen:  Soft, nondistended, nontender to palpation with only some minimal soreness in left lower quadrant.  Patient has a periumbilical hernia containing fat which is chronically incarcerated and non-tender.  Labs:  Recent Labs    12/05/17 0724 12/06/17 0336  WBC 5.7 5.3  HGB 11.8* 11.0*  HCT 36.9* 34.3*  PLT 91* 99*   Recent Labs    12/05/17 0724 12/06/17 0336  NA 137 137  K 5.5* 5.1  CL 100* 99*  CO2 27 25  GLUCOSE 101* 73  BUN 44* 64*  CREATININE 12.80* 15.16*  CALCIUM 10.0 9.8   Recent Labs    12/05/17 0724  LABPROT 14.5  INR 1.14    Imaging: No results found.  Assessment/Plan: 56 yo male with diverticulitis.  --Will advance to renal diet for breakfast --transition tonight to oral antibiotics.  Appreciate pharmacy's help with dosing of his Augmentin for home. --Likely discharge to home tomorrow.   Melvyn Neth, [place]

## 2017-12-06 NOTE — Progress Notes (Signed)
Post dialysis assessment 

## 2017-12-06 NOTE — Progress Notes (Signed)
HD started. 

## 2017-12-07 MED ORDER — CINACALCET HCL 30 MG PO TABS
60.0000 mg | ORAL_TABLET | Freq: Three times a day (TID) | ORAL | Status: DC
  Filled 2017-12-07 (×2): qty 2

## 2017-12-07 MED ORDER — AMOXICILLIN-POT CLAVULANATE 500-125 MG PO TABS: 1.0000 | ORAL_TABLET | Freq: Every day | ORAL | 1 refills | Status: DC

## 2017-12-07 MED ORDER — OXYCODONE HCL 10 MG PO TABS: 10.0000 mg | ORAL_TABLET | ORAL | 0 refills | Status: DC | PRN

## 2017-12-07 MED ORDER — CALCIUM ACETATE (PHOS BINDER) 667 MG PO CAPS
1334.0000 mg | ORAL_CAPSULE | Freq: Three times a day (TID) | ORAL | Status: DC
  Filled 2017-12-07 (×2): qty 2

## 2017-12-07 NOTE — Discharge Instructions (Signed)
Resume home medications Take oral Augmentin as directed and oral analgesics for pain- take augmentin in the evening AFTER dialysis Follow-up with Dr. Adine Madura in 2 weeks

## 2017-12-07 NOTE — Progress Notes (Signed)
Patient is eating food without difficulty. Dr Candiss Norse paged and notified. Patient restarted on Phoslo and Sensipar.

## 2017-12-07 NOTE — Progress Notes (Signed)
Nurse page chaplain and asked him to visit with pt. who she stated wanted to speak with chaplain. [place] met pt. Pt appeared lonely and wanted someone to talk to. Pt talked to [facility] about his health challenges, his family and he talked about his discharged this AM after the doctor clears him. Doctor visited pt while [place] was in the Rm, checked on him and cleared him for discharge. Pt asked for prayer as a means of spiritual reflection and , which [place] offered with a ministry of presence.    12/07/17 1100  Clinical Encounter Type  Visited With Patient;Health care provider  Visit Type Initial;Spiritual support;Other (Comment)  Referral From Nurse  Consult/Referral To Chaplain  Spiritual Encounters  Spiritual Needs Prayer;Emotional;Other (Comment)

## 2017-12-07 NOTE — Discharge Summary (Signed)
Physician Discharge Summary  Patient ID: [name] [name] MRN: [id number] DOB/AGE: 1961/01/19 56 y.o.  Admit date: 12/05/2017 Discharge date: 12/07/2017   Discharge Diagnoses:  Active Problems:   Diverticulitis large intestine   Procedures: Dialysis  Hospital Course: This is a patient admitted to the hospital with acute diverticulitis and microperforation.  He was started on IV antibiotics.  Pharmacy was consulted to assist in management.  He was hemodialyzed as well.  Internal medicine and nephrology were involved.  Currently his abdomen is soft and minimally tender he is feeling much better and is discharged on a pharmacy corrected dose for his oral antibiotics.  He will follow-up with Dr. Clydene Laming him in 10 days  Consults: Internal medicine, nephrology  Disposition: 01-Home or Self Care    Follow-up Information    Clayburn Pert, MD Follow up in 2 week(s).   Specialty:  General Surgery Contact information: [address] [place] [id number] [phone]           Florene Glen, MD, FACS

## 2017-12-07 NOTE — Progress Notes (Signed)
Patient discharging home. Instructions and prescription given to patient. Verbalized understanding.

## 2017-12-07 NOTE — Progress Notes (Signed)
CC: Acute diverticulitis Subjective: This patient admitted to the hospital with acute diverticulitis.  He has improved considerably and is tolerating a diet.  He has minimal pain and wants to be discharged.  Objective: Vital signs in last 24 hours: Temp:  [98.2 F (36.8 C)-98.8 F (37.1 C)] 98.8 F (37.1 C) (12/29 0730) Pulse Rate:  [68-93] 82 (12/29 0730) Resp:  [11-25] 18 (12/29 0730) BP: (110-158)/(55-85) 146/73 (12/29 0730) SpO2:  [89 %-99 %] 98 % (12/29 0730) Weight:  [217 lb 14.4 oz (98.8 kg)] 217 lb 14.4 oz (98.8 kg) (12/29 0448) Last BM Date: 12/06/17  Intake/Output from previous day: No intake/output data recorded. Intake/Output this shift: No intake/output data recorded.  Physical exam:  Abdomen is soft nondistended nontympanitic and only minimally tender without peritoneal signs in the left lower quadrant Vital signs are stable Awake alert and oriented  Lab Results: CBC  Recent Labs    12/05/17 0724 12/06/17 0336  WBC 5.7 5.3  HGB 11.8* 11.0*  HCT 36.9* 34.3*  PLT 91* 99*   BMET Recent Labs    12/05/17 0724 12/06/17 0336  NA 137 137  K 5.5* 5.1  CL 100* 99*  CO2 27 25  GLUCOSE 101* 73  BUN 44* 64*  CREATININE 12.80* 15.16*  CALCIUM 10.0 9.8   PT/INR Recent Labs    12/05/17 0724  LABPROT 14.5  INR 1.14   ABG No results for input(s): PHART, HCO3 in the last 72 hours.  Invalid input(s): PCO2, PO2  Studies/Results: No results found.  Anti-infectives: Anti-infectives (From admission, onward)   Start     Dose/Rate Route Frequency Ordered Stop   12/07/17 0000  amoxicillin-clavulanate (AUGMENTIN) 500-125 MG tablet     1 tablet Oral Daily 12/07/17 0733     12/06/17 2000  amoxicillin-clavulanate (AUGMENTIN) 500-125 MG per tablet 500 mg     1 tablet Oral Daily 12/06/17 1948     12/06/17 1945  amoxicillin-clavulanate (AUGMENTIN) 500-125 MG per tablet 500 mg  Status:  Discontinued     1 tablet Oral Daily 12/06/17 1936 12/06/17 1948   12/05/17  0800  piperacillin-tazobactam (ZOSYN) IVPB 3.375 g  Status:  Discontinued     3.375 g 12.5 mL/hr over 240 Minutes Intravenous Every 12 hours 12/05/17 0656 12/06/17 1936   12/05/17 0700  piperacillin-tazobactam (ZOSYN) IVPB 2.25 g  Status:  Discontinued     2.25 g 100 mL/hr over 30 Minutes Intravenous Every 8 hours 12/05/17 0652 12/05/17 0656   12/05/17 0445  piperacillin-tazobactam (ZOSYN) IVPB 3.375 g  Status:  Discontinued     3.375 g 100 mL/hr over 30 Minutes Intravenous  Once 12/05/17 0435 12/05/17 0656   12/05/17 0430  piperacillin-tazobactam (ZOSYN) IVPB 2.25 g  Status:  Discontinued     2.25 g 100 mL/hr over 30 Minutes Intravenous  Once 12/05/17 0415 12/05/17 0435      Assessment/Plan:  Patient is doing very well at this point.  Will be able to advance diet and discharge today on oral antibiotics.  Pharmacy has determined an appropriate dose for his antibiotics.  He will follow-up with Dr. Adonis Huguenin in 10 days.  Florene Glen, MD, FACS  12/07/2017

## 2017-12-25 ENCOUNTER — Other Ambulatory Visit: Payer: Self-pay

## 2017-12-26 ENCOUNTER — Encounter: Payer: Self-pay | Admitting: General Surgery

## 2017-12-26 ENCOUNTER — Ambulatory Visit (INDEPENDENT_AMBULATORY_CARE_PROVIDER_SITE_OTHER): Payer: Medicare Other | Admitting: General Surgery

## 2017-12-26 VITALS — BP 154/81 | HR 74 | Temp 98.1°F | Ht 73.0 in | Wt 237.6 lb

## 2017-12-26 DIAGNOSIS — K5792 Diverticulitis of intestine, part unspecified, without perforation or abscess without bleeding: Secondary | ICD-10-CM

## 2017-12-26 DIAGNOSIS — K5720 Diverticulitis of large intestine with perforation and abscess without bleeding: Secondary | ICD-10-CM

## 2017-12-26 MED ORDER — AMOXICILLIN-POT CLAVULANATE 500-125 MG PO TABS: 1.0000 | ORAL_TABLET | Freq: Every day | ORAL | 1 refills | Status: DC

## 2017-12-26 NOTE — Progress Notes (Signed)
Outpatient Surgical Follow Up  12/26/2017  [name] [name] is an 57 y.o. male.   Chief Complaint  Patient presents with  . Follow-up    Diverticulitis    HPI: 57 year old male returns to clinic for recent hospitalization for diverticulitis.  Patient reports he is still on antibiotics.  He continues to have some mild tenderness to the left lower quadrant.  He denies any current fevers, chills, nausea, vomiting, chest pain, shortness of breath, diarrhea, constipation.  He also has a midline hernia that he would like to have fixed as well.  Past Medical History:  Diagnosis Date  . Anemia of chronic disease    BL Hgb ~10  . CHF (congestive heart failure) ([place])    pt denies 04/01/14  . Diverticulitis   . End-stage renal disease on hemodialysis ([place])    HD m/w/f - right arm graft  . History of blood transfusion   . Hypertension   . Pneumonia    hx of  . Pneumonia   . Polysubstance abuse ([place])   . Renal insufficiency   . Secondary hyperparathyroidism ([place])   . Sleep disturbance   . Tobacco abuse     Past Surgical History:  Procedure Laterality Date  . APPENDECTOMY    . CHEST TUBE INSERTION  02/2013  . COLONOSCOPY  12/16/2012   Procedure: COLONOSCOPY;  Surgeon: Milus Banister, MD;  Location: [place];  Service: Endoscopy;  Laterality: N/A;  . PLEURAL EFFUSION DRAINAGE  12/18/2012   Procedure: DRAINAGE OF PLEURAL EFFUSION;  Surgeon: Melrose Nakayama, MD;  Location: [place];  Service: Thoracic;  Laterality: Right;  . surgery for horseshoe kidney    . TALC PLEURODESIS Right 02/05/2013   Procedure: Pietro Cassis;  Surgeon: Melrose Nakayama, MD;  Location: [place];  Service: Thoracic;  Laterality: Right;  . TEE WITHOUT CARDIOVERSION N/A 01/16/2013   Procedure: TRANSESOPHAGEAL ECHOCARDIOGRAM (TEE);  Surgeon: Sanda Klein, MD;  Location: [facility] ENDOSCOPY;  Service: Cardiovascular;  Laterality: N/A;  . VIDEO ASSISTED THORACOSCOPY  12/18/2012   Procedure: VIDEO ASSISTED THORACOSCOPY;   Surgeon: Melrose Nakayama, MD;  Location: [place];  Service: Thoracic;  Laterality: Right;  pleural peel  . VIDEO ASSISTED THORACOSCOPY (VATS)/THOROCOTOMY Right 02/19/2013   Procedure: VIDEO ASSISTED THORACOSCOPY (VATS)/THOROCOTOMY;  Surgeon: Melrose Nakayama, MD;  Location: [place];  Service: Thoracic;  Laterality: Right;  Right Video Assisted Thoracoscopy, Right Thoracotomy, Decortication,   . VIDEO BRONCHOSCOPY WITH INSERTION OF INTERBRONCHIAL VALVE (IBV)  01/09/2013   Procedure: VIDEO BRONCHOSCOPY WITH INSERTION OF INTERBRONCHIAL VALVE (IBV);  Surgeon: Melrose Nakayama, MD;  Location: [place];  Service: Thoracic;  Laterality: N/A;  placement of 2 Intrabronchial valves in right lower lobe  . VIDEO BRONCHOSCOPY WITH INSERTION OF INTERBRONCHIAL VALVE (IBV) Right 02/19/2013   Procedure: VIDEO BRONCHOSCOPY WITH INSERTION OF INTERBRONCHIAL VALVE (IBV);  Surgeon: Melrose Nakayama, MD;  Location: [place];  Service: Thoracic;  Laterality: Right;  removal of two interbronchial valves right chest    Family History  Problem Relation Age of Onset  . Thyroid disease Mother   . Renal Disease Maternal Grandmother   . Diabetes Maternal Grandmother   . Hypertension Maternal Aunt   . Hypertension Maternal Uncle     Social History:  reports that he has been smoking cigarettes.  He has a 120.00 pack-year smoking history. He uses smokeless tobacco. He reports that he drinks about 1.8 oz of alcohol per week. He reports that he uses drugs. Drug: Marijuana.  Allergies:  Allergies  Allergen Reactions  . Other     Other reaction(s): Other (See Comments) blister  . Tape Other (See Comments)    Plastic Tape Causes blister    Medications reviewed.    ROS A multipoint review of systems was completed, all pertinent positives and negatives are documented within the HPI and the remainder are negative   BP (!) 154/81   Pulse 74   Temp 98.1 F (36.7 C) (Oral)   Ht 6\' 1"  (1.854 m)   Wt 107.8 kg (237  lb 9.6 oz)   BMI 31.35 kg/m   Physical Exam General: No acute distress Chest: Clear to auscultation  heart: Regular rhythm Abdomen: Large, soft, nondistended.  Minimally tender to deep palpation in left lower quadrant.  Obvious large supraumbilical hernia that is soft and at least partially reducible.    No results found for this or any previous visit (from the past 48 hour(s)). No results found.  Assessment/Plan:  1. Diverticulitis of intestine without perforation or abscess without bleeding, unspecified part of intestinal tract Patient with continued tenderness.  He was somewhat argumentative about whether or not he still had diverticulitis.  Patient reports that he is feeling much better than when he was in the hospital.  Had a long conversation about the importance of adequate treatment for diverticulitis.  Discussed that I would like for him to be pain-free before he stops the antibiotics.  Given his persistent questioning whether or not he is to continue the antibiotics at that the only other alternative is to repeat his imaging to prove whether or not he still has inflammation.  He states he would like to do this.  CT scan of the abdomen and pelvis will be obtained.  Should show continued diverticulitis I would again recommend continuing the antibiotics until he is pain-free and then for an additional week. - CT Abdomen Pelvis W Contrast; Future  2. Diverticulitis of large intestine with perforation without bleeding As above.  Patient to follow-up in 2 weeks.  Patient will need a colonoscopy in 6-8 weeks.     Clayburn Pert, MD FACS General Surgeon  12/26/2017,2:01 PM

## 2017-12-26 NOTE — Patient Instructions (Signed)
Your CT scan is 12/30/17  @ 11:00 am at outpatient imaging.  See address below.   You will need to stop by radiology to pick up your prep kit today.   We want you to continue your antibiotic until pain free. Please finish the course you have on hand and get the Medicine refilled and start.   We will send the referral to [facility] from their office will contact you to schedule an appointment to discuss Colonoscopy. If you have not heard from anyone within 5-7 days please call our office so we can check on this for you.    Please see your follow up appointment listed below.

## 2017-12-30 ENCOUNTER — Telehealth: Payer: Self-pay | Admitting: General Practice

## 2017-12-30 ENCOUNTER — Ambulatory Visit
Admission: RE | Admit: 2017-12-30 | Discharge: 2017-12-30 | Disposition: A | Discharge status: Home / Self Care | Payer: Medicare Other | Source: Ambulatory Visit | Attending: General Surgery | Admitting: General Surgery

## 2017-12-30 DIAGNOSIS — K5792 Diverticulitis of intestine, part unspecified, without perforation or abscess without bleeding: Secondary | ICD-10-CM

## 2017-12-30 MED ORDER — IOPAMIDOL (ISOVUE-300) INJECTION 61%: 100.0000 mL | Freq: Once | INTRAVENOUS | Status: DC | PRN

## 2017-12-30 NOTE — Telephone Encounter (Signed)
[name] [name] with ct department at the outpatient imaging at [facility] call her back at  [phone] said they needed to r/s patient's CT scan due to the patient not being able to have dialysis within 24 hours before ct. Please call and r/s.

## 2017-12-30 NOTE — Telephone Encounter (Signed)
Patient notified of appointment change listed below.  CT scan- 01/02/18 @ 8:30 am Dr.Woodham 01/06/18 @ 9:45 am

## 2018-01-01 ENCOUNTER — Telehealth: Payer: Self-pay

## 2018-01-01 ENCOUNTER — Other Ambulatory Visit: Payer: Self-pay

## 2018-01-01 DIAGNOSIS — K5720 Diverticulitis of large intestine with perforation and abscess without bleeding: Secondary | ICD-10-CM

## 2018-01-01 NOTE — Telephone Encounter (Signed)
Patient called and stated that he does not want to take the contrast that is needed for his CT Scan. He states that he doesn't want any "poison chemicals" in his body. I stated to the patient that its not a poison and that its not different than him being a smoker. He stated "well and I don't want to poisons in my body". He stated that he has a friend that goes to the [place] and they don't use it and that [facility] told him there are other was to get scanned. I advised him that we would have to get the CT Scan reordered without contrast but before I was able to do that I would have to speak with Dr. Adonis Huguenin to make sure that is okay. He states "Do what you have to do and call me back"

## 2018-01-02 ENCOUNTER — Ambulatory Visit: Admission: RE | Admit: 2018-01-02 | Payer: Medicare Other | Source: Ambulatory Visit

## 2018-01-03 ENCOUNTER — Telehealth: Payer: Self-pay | Admitting: Surgery

## 2018-01-03 ENCOUNTER — Ambulatory Visit: Payer: Medicare Other | Admitting: Surgery

## 2018-01-03 NOTE — Telephone Encounter (Signed)
Patient is confused about all of his appointments. He did not have his CT scan yet because it was scheduled on the same day as his dialysis. He needs to reschedule his appointment with [place] after his CT but would like to speak to the nurse first. Please advise

## 2018-01-03 NOTE — Telephone Encounter (Signed)
Called returned to patient at this time and I advised him that I would have to speak with the provider to see what he suggest the patient do next as far as having a CT without contrast due to patient not wanting contrast in his system or if its okay for him to be seen without the CT Scan. I verbalized that I would give him a call on Monday to let him know what is advised. Patient verbalized understanding.

## 2018-01-06 ENCOUNTER — Ambulatory Visit: Payer: Medicare Other | Admitting: General Surgery

## 2018-01-09 ENCOUNTER — Telehealth: Payer: Self-pay

## 2018-01-09 NOTE — Telephone Encounter (Signed)
Spoke with Dr. Adonis Huguenin regarding patient being noncompliant. He verbalized that the patient started out initial being a Dr. Antionette Char patient and if he does not want to follow what we advise then we can not make him. I verbalized understanding at this time and stated that I would give the patient one more call and advise.    Call made to patient at this time. Patient answered the phone speaking Spanish and wanted to know who was calling. I advised him that I was calling from [facility] and that I was wanting to know if he wanted to follow up with Dr. Burt Knack to discuss his diverticulitis that he was seen in the hospital for. He stated that he did not know who Dr. Burt Knack was and that he was not sure why we were contacting him. He advised that the doctor he saw in the hospital was from [facility] and they were going to do his surgery at [facility]. I verbalized understanding and stated that if he need [place] for anything to give our office call.

## 2018-01-14 ENCOUNTER — Encounter: Payer: Self-pay | Admitting: Gastroenterology

## 2018-02-24 ENCOUNTER — Telehealth: Payer: Self-pay | Admitting: Gastroenterology

## 2018-02-24 NOTE — Telephone Encounter (Signed)
Pt states she just received a call stating he has a colonoscopy tomorrow he has not been on a liquid diet and has not received  any instruction  And also needs his rx for procedure  cb # [phone]

## 2018-02-25 ENCOUNTER — Encounter: Admission: RE | Payer: Self-pay | Source: Ambulatory Visit

## 2018-02-25 ENCOUNTER — Ambulatory Visit: Admission: RE | Admit: 2018-02-25 | Payer: Medicare Other | Source: Ambulatory Visit | Admitting: Gastroenterology

## 2018-02-25 SURGERY — COLONOSCOPY WITH PROPOFOL
Anesthesia: General

## 2018-03-27 ENCOUNTER — Encounter: Payer: Self-pay | Admitting: Emergency Medicine

## 2018-03-27 ENCOUNTER — Other Ambulatory Visit: Payer: Self-pay

## 2018-03-27 ENCOUNTER — Emergency Department
Admission: EM | Admit: 2018-03-27 | Discharge: 2018-03-27 | Disposition: A | Discharge status: Home / Self Care | Payer: Medicare Other | Attending: Emergency Medicine | Admitting: Emergency Medicine

## 2018-03-27 DIAGNOSIS — Z79899 Other long term (current) drug therapy: Secondary | ICD-10-CM | POA: Insufficient documentation

## 2018-03-27 DIAGNOSIS — I770 Arteriovenous fistula, acquired: Secondary | ICD-10-CM | POA: Insufficient documentation

## 2018-03-27 DIAGNOSIS — Z992 Dependence on renal dialysis: Secondary | ICD-10-CM | POA: Insufficient documentation

## 2018-03-27 DIAGNOSIS — I509 Heart failure, unspecified: Secondary | ICD-10-CM | POA: Insufficient documentation

## 2018-03-27 DIAGNOSIS — Z5189 Encounter for other specified aftercare: Secondary | ICD-10-CM | POA: Diagnosis not present

## 2018-03-27 DIAGNOSIS — N186 End stage renal disease: Secondary | ICD-10-CM | POA: Insufficient documentation

## 2018-03-27 DIAGNOSIS — F17210 Nicotine dependence, cigarettes, uncomplicated: Secondary | ICD-10-CM | POA: Insufficient documentation

## 2018-03-27 DIAGNOSIS — I132 Hypertensive heart and chronic kidney disease with heart failure and with stage 5 chronic kidney disease, or end stage renal disease: Secondary | ICD-10-CM | POA: Diagnosis not present

## 2018-03-27 LAB — CBC WITH DIFFERENTIAL/PLATELET
Basophils Absolute: 0 10*3/uL (ref 0–0.1)
Basophils Relative: 1 %
EOS PCT: 4 %
Eosinophils Absolute: 0.1 10*3/uL (ref 0–0.7)
HCT: 30.8 % — ABNORMAL LOW (ref 40.0–52.0)
Hemoglobin: 9.8 g/dL — ABNORMAL LOW (ref 13.0–18.0)
LYMPHS ABS: 0.5 10*3/uL — AB (ref 1.0–3.6)
Lymphocytes Relative: 13 %
MCH: 31.1 pg (ref 26.0–34.0)
MCHC: 31.9 g/dL — ABNORMAL LOW (ref 32.0–36.0)
MCV: 97.7 fL (ref 80.0–100.0)
MONO ABS: 0.5 10*3/uL (ref 0.2–1.0)
Monocytes Relative: 12 %
Neutro Abs: 2.6 10*3/uL (ref 1.4–6.5)
Neutrophils Relative %: 70 %
PLATELETS: 107 10*3/uL — AB (ref 150–440)
RBC: 3.15 MIL/uL — ABNORMAL LOW (ref 4.40–5.90)
RDW: 15.4 % — AB (ref 11.5–14.5)
WBC: 3.8 10*3/uL (ref 3.8–10.6)

## 2018-03-27 LAB — PROTIME-INR
INR: 1.12
Prothrombin Time: 14.3 seconds (ref 11.4–15.2)

## 2018-03-27 NOTE — Discharge Instructions (Signed)
If you have bleeding, apply gentle pressure to the area. If it persists or is a good amount please return to the emergency department.

## 2018-03-27 NOTE — ED Triage Notes (Addendum)
Patient ambulatory to triage with steady gait, without difficulty or distress noted; pt reports had dialysis shunt placed today at [facility]; now with some bleeding around site; transparent occlusive dressing in place with mod amount blood noted beneath around insertion site

## 2018-03-27 NOTE — ED Provider Notes (Addendum)
[facility] Emergency Department Provider Note  ____________________________________________   I have reviewed the triage vital signs and the nursing notes. Where available I have reviewed prior notes and, if possible and indicated, outside hospital notes.    HISTORY  Chief Complaint Post-op Problem    HPI [name] [name] is a 57 y.o. male who has a history of anemia, hypertension, and ESRD on dialysis, he came from [facility] where today he had a catheter placed in his right shoulder.  There is been a scant amount of bleeding around it and there is blood underneath the transparent Tegaderm dressing and he wants the dressing changed.  No ongoing bleeding at this time.  He is not on blood thinners.   Past Medical History:  Diagnosis Date  . Anemia of chronic disease    BL Hgb ~10  . CHF (congestive heart failure) ([place])    pt denies 04/01/14  . Diverticulitis   . End-stage renal disease on hemodialysis ([place])    HD m/w/f - right arm graft  . History of blood transfusion   . Hypertension   . Pneumonia    hx of  . Pneumonia   . Polysubstance abuse ([place])   . Renal insufficiency   . Secondary hyperparathyroidism ([place])   . Sleep disturbance   . Tobacco abuse     Patient Active Problem List   Diagnosis Date Noted  . Diverticulitis large intestine 12/05/2017  . Diverticulitis of colon 04/24/2015  . Community acquired pneumonia 04/18/2015  . Sepsis ([place]) 04/16/2015  . Shortness of breath 07/07/2013  . Acidosis, metabolic, with respiratory acidosis 04/23/2013  . Acute post-thoracotomy pain 04/23/2013  . Hemorrhagic shock ([place]) 04/23/2013  . S/P sympathectomy 04/23/2013  . 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 Persistent air leak 03/18/2013  . Pneumothorax, right 03/18/2013  . S/P thoracotomy 03/04/2013  . 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 ([place]) 01/17/2013  . Bacteremia 01/06/2013  . Benign neoplasm of  colon 12/16/2012  . Diverticulosis of colon (without mention of hemorrhage) 12/16/2012  . ESCRF (end stage chronic renal failure) ([place]) 12/12/2012  . H/O: hypertension 12/12/2012  . Pleural effusion, right 12/12/2012  . History of prolonged Q-T interval on ECG 12/12/2012  . Hyperkalemia 12/11/2012  . Acute blood loss anemia 12/11/2012  . Sinus tachycardia, resolved with PRBC resuscitation 12/11/2012  . Lower GI bleed 12/11/2012  . End-stage renal disease on hemodialysis ([place])   . Secondary hyperparathyroidism, renal ([place])   . Hypotension   . Polysubstance abuse ([place])   . Anemia of chronic disease   . Tobacco abuse     Past Surgical History:  Procedure Laterality Date  . APPENDECTOMY    . CHEST TUBE INSERTION  02/2013  . COLONOSCOPY  12/16/2012   Procedure: COLONOSCOPY;  Surgeon: Milus Banister, MD;  Location: [place];  Service: Endoscopy;  Laterality: N/A;  . PLEURAL EFFUSION DRAINAGE  12/18/2012   Procedure: DRAINAGE OF PLEURAL EFFUSION;  Surgeon: Melrose Nakayama, MD;  Location: [place];  Service: Thoracic;  Laterality: Right;  . surgery for horseshoe kidney    . TALC PLEURODESIS Right 02/05/2013   Procedure: Pietro Cassis;  Surgeon: Melrose Nakayama, MD;  Location: [place];  Service: Thoracic;  Laterality: Right;  . TEE WITHOUT CARDIOVERSION N/A 01/16/2013   Procedure: TRANSESOPHAGEAL ECHOCARDIOGRAM (TEE);  Surgeon: Sanda Klein, MD;  Location: [place];  Service: Cardiovascular;  Laterality: N/A;  . VIDEO ASSISTED THORACOSCOPY  12/18/2012   Procedure: VIDEO  ASSISTED THORACOSCOPY;  Surgeon: Melrose Nakayama, MD;  Location: [place];  Service: Thoracic;  Laterality: Right;  pleural peel  . VIDEO ASSISTED THORACOSCOPY (VATS)/THOROCOTOMY Right 02/19/2013   Procedure: VIDEO ASSISTED THORACOSCOPY (VATS)/THOROCOTOMY;  Surgeon: Melrose Nakayama, MD;  Location: [place];  Service: Thoracic;  Laterality: Right;  Right Video Assisted Thoracoscopy, Right Thoracotomy,  Decortication,   . VIDEO BRONCHOSCOPY WITH INSERTION OF INTERBRONCHIAL VALVE (IBV)  01/09/2013   Procedure: VIDEO BRONCHOSCOPY WITH INSERTION OF INTERBRONCHIAL VALVE (IBV);  Surgeon: Melrose Nakayama, MD;  Location: [place];  Service: Thoracic;  Laterality: N/A;  placement of 2 Intrabronchial valves in right lower lobe  . VIDEO BRONCHOSCOPY WITH INSERTION OF INTERBRONCHIAL VALVE (IBV) Right 02/19/2013   Procedure: VIDEO BRONCHOSCOPY WITH INSERTION OF INTERBRONCHIAL VALVE (IBV);  Surgeon: Melrose Nakayama, MD;  Location: [place];  Service: Thoracic;  Laterality: Right;  removal of two interbronchial valves right chest    Prior to Admission medications   Medication Sig Start Date End Date Taking? Authorizing Provider  acetaminophen (TYLENOL) 500 MG tablet Take 1,000 mg by mouth every 8 (eight) hours as needed for moderate pain.    [provider]  amoxicillin-clavulanate (AUGMENTIN) 500-125 MG tablet Take 1 tablet (500 mg total) by mouth daily. 12/26/17   Clayburn Pert, MD  Bismuth Subsalicylate (KAOPECTATE PO) Take 15 mLs by mouth daily as needed (for nausea).    [provider]  calcium acetate (PHOSLO) 667 MG capsule Take 2,668 mg by mouth 3 (three) times daily with meals.     [provider]  cinacalcet (SENSIPAR) 90 MG tablet Take 90 mg by mouth daily.    [provider]  metoprolol succinate (TOPROL-XL) 50 MG 24 hr tablet Take 50 mg by mouth daily. Take with or immediately following a meal.    [provider]  ondansetron (ZOFRAN) 4 MG tablet Take 4 mg by mouth every 8 (eight) hours as needed. 04/21/15   [provider]  zolpidem (AMBIEN) 5 MG tablet Take by mouth.    [provider]    Allergies Other and Tape  Family History  Problem Relation Age of Onset  . Thyroid disease Mother   . Renal Disease Maternal Grandmother   . Diabetes Maternal Grandmother   . Hypertension Maternal Aunt   . Hypertension Maternal Uncle      Social History Social History   Tobacco Use  . Smoking status: Current Every Day Smoker    Packs/day: 6.00    Years: 20.00    Pack years: 120.00    Types: Cigarettes  . Smokeless tobacco: Current User  Substance Use Topics  . Alcohol use: Yes    Alcohol/week: 1.8 oz    Types: 3 Cans of beer per week    Comment: occasional  . Drug use: Yes    Types: Marijuana    Comment: remote cocaine abuse, ongoing marijuana use.    Review of Systems See HPI   ____________________________________________   PHYSICAL EXAM:  VITAL SIGNS: ED Triage Vitals  Enc Vitals Group     BP 03/27/18 1943 (!) 173/97     Pulse Rate 03/27/18 1943 85     Resp 03/27/18 1943 18     Temp 03/27/18 1943 98.2 F (36.8 C)     Temp Source 03/27/18 1943 Oral     SpO2 03/27/18 1943 97 %     Weight 03/27/18 1942 227 lb 1.2 oz (103 kg)     Height 03/27/18 1942 6\' 1"  (1.854  m)     Head Circumference --      Peak Flow --      Pain Score 03/27/18 1942 10     Pain Loc --      Pain Edu? --      Excl. in [place]? --     Constitutional: Alert and oriented. Well appearing and in no acute distress. Neck:   Nontender with no meningismus, no masses, no stridor Cardiovascular: Normal rate, regular rhythm. Grossly normal heart sounds.  Good peripheral circulation. Respiratory: Normal respiratory effort.  No retractions. Lungs CTAB. Skin:  Skin is warm, dry and intact. No rash noted.  The site of the Angiocath is intact in the right shoulder, there is no evidence of active bleeding there is scant amount of blood underneath the clear dressing with no evidence of continuing extravasation Psychiatric: Mood and affect are normal. Speech and behavior are normal.  ____________________________________________   LABS (all labs ordered are listed, but only abnormal results are displayed)  Labs Reviewed  CBC WITH DIFFERENTIAL/PLATELET  PROTIME-INR    Pertinent labs  results that were available during my care of the  patient were reviewed by me and considered in my medical decision making (see chart for details). ____________________________________________  EKG  I personally interpreted any EKGs ordered by me or triage  ____________________________________________  RADIOLOGY  Pertinent labs & imaging results that were available during my care of the patient were reviewed by me and considered in my medical decision making (see chart for details). If possible, patient and/or family made aware of any abnormal findings.  No results found. ____________________________________________    PROCEDURES  Procedure(s) performed: None  Procedures  Critical Care performed: None  ____________________________________________   INITIAL IMPRESSION / ASSESSMENT AND PLAN / ED COURSE  Pertinent labs & imaging results that were available during my care of the patient were reviewed by me and considered in my medical decision making (see chart for details).  Here because he had some oozing around a Angiocath, it looks like scant amount of blood is does not appear to be ongoing with bleeding.  I will get baseline blood work in case this happens again that he has to come back we will know where his stands in terms of his coags and his CBC, also check platelet count to make sure that this is not happening because of from cytopenia.  However low suspicion of all those entities.  He is known to be baseline anemic, last CBC at [facility] showed hemoglobin near 11.  It was at that time were 100 whether his platelets.  My thought is that the bleeding probably is because of his low platelets and at this time it seems to be tamponaded by the dressing itself I am reluctant to go pulling his dressing off which could cause the bleeding again.  ----------------------------------------- 8:55 PM on 03/27/2018 -----------------------------------------  Noted, patient with persistent thrombus cytopenia, slight decrease in hemoglobin in  the last month.  No significant change however.  No ongoing bleeding here.  I did explain to him that with thrombocytopenia, it can take a while to clot, and that removing the dressing could simply disrupt the clot that he is already formed.  He has not been bleeding here.  We will continue to watch him if he does not continually we will discharge him with close outpatient follow-up tomorrow at dialysis.  Se and family very comfortable with this plan.   ____________________________________________   FINAL CLINICAL IMPRESSION(S) / ED DIAGNOSES  Final diagnoses:  None      This chart was dictated using voice recognition software.  Despite best efforts to proofread,  errors can occur which can change meaning.      Schuyler Amor, MD 03/27/18 2013    Schuyler Amor, MD 03/27/18 2056

## 2018-03-27 NOTE — ED Notes (Signed)
Upon assessment bleeding (that appears contained) is visible around dressing on upper right arm

## 2018-04-30 ENCOUNTER — Other Ambulatory Visit: Payer: Self-pay | Admitting: Nephrology

## 2018-04-30 DIAGNOSIS — R05 Cough: Secondary | ICD-10-CM

## 2018-04-30 DIAGNOSIS — R059 Cough, unspecified: Secondary | ICD-10-CM

## 2018-04-30 DIAGNOSIS — R0602 Shortness of breath: Secondary | ICD-10-CM

## 2018-05-29 IMAGING — CT CT CTA ABD/PEL W/CM AND/OR W/O CM
2 of 9 series · 9 of 46 positions shown, 15 images · IV contrast (isovue)
Comparison: 04/22/2015

CLINICAL DATA: Sharp lower abdominal pain for 2 days. History of
diverticulosis. Dialysis patient.

EXAM:
CTA ABDOMEN AND PELVIS wITHOUT AND WITH CONTRAST
TECHNIQUE: Multidetector CT imaging of the abdomen and pelvis was performed
using the standard protocol during bolus administration of
intravenous contrast. Multiplanar reconstructed images and MIPs were
obtained and reviewed to evaluate the vascular anatomy.
CONTRAST:  100 mL Isovue 370

[Series 5: axial venous · axial · portal-venous · 0.79mm/px · z∈[-922,-557]mm · 7 of 99 slices shown, 12 images]
[im 13/99  soft-tissue]
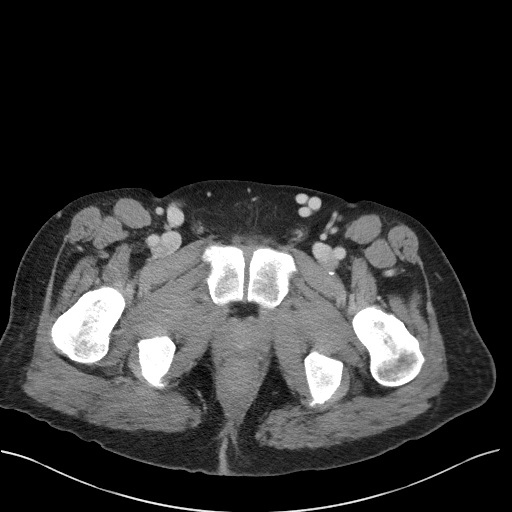
[im 13/99  bone]
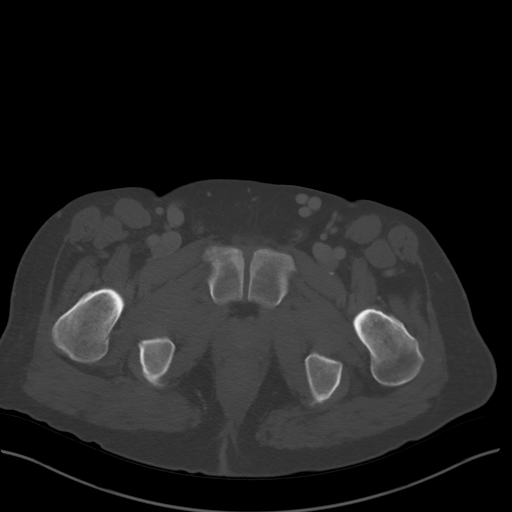
[im 25/99  soft-tissue]
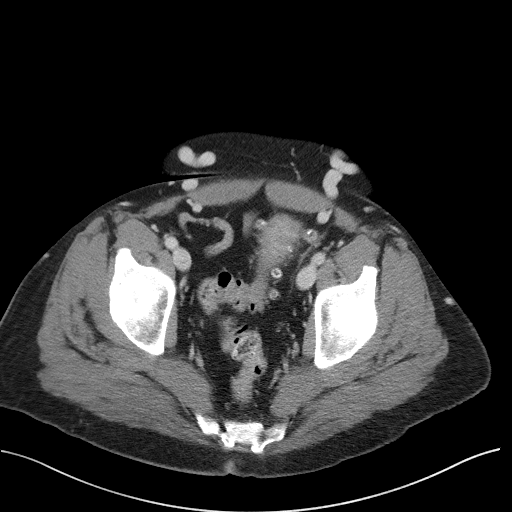
[im 37/99  soft-tissue]
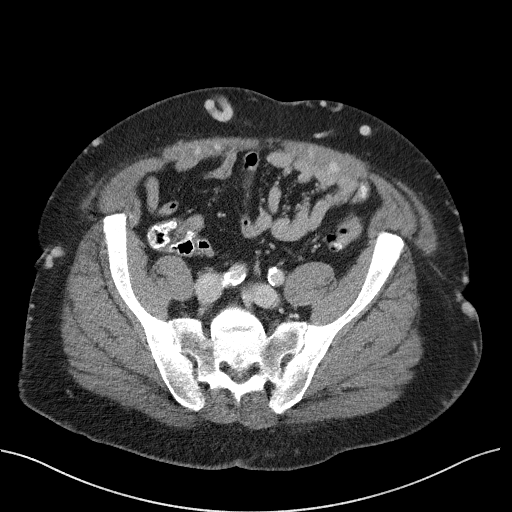
[im 50/99  soft-tissue]
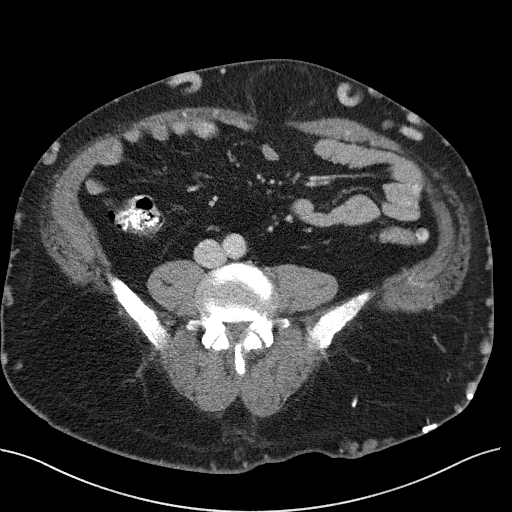
[im 50/99  lung]
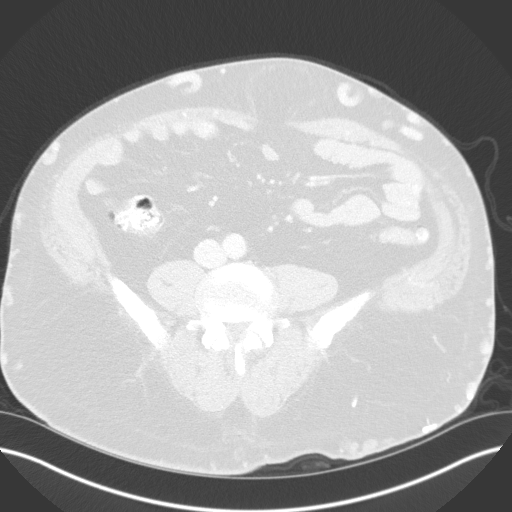
[im 62/99  soft-tissue]
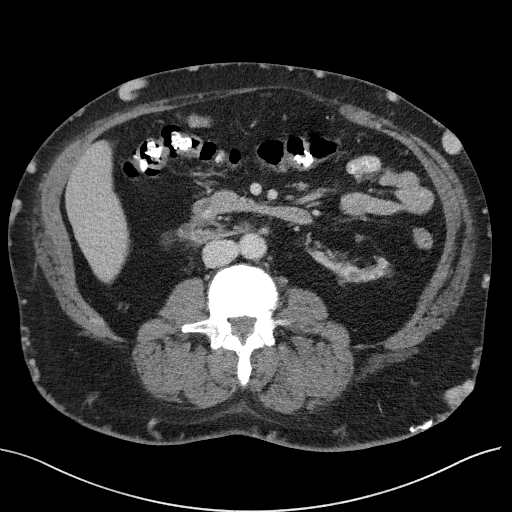
[im 62/99  lung]
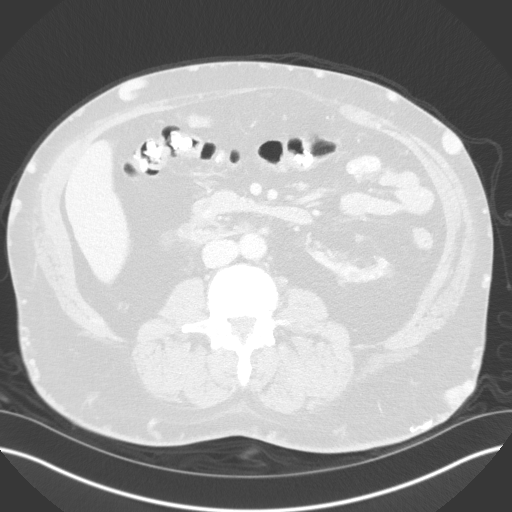
[im 74/99  soft-tissue]
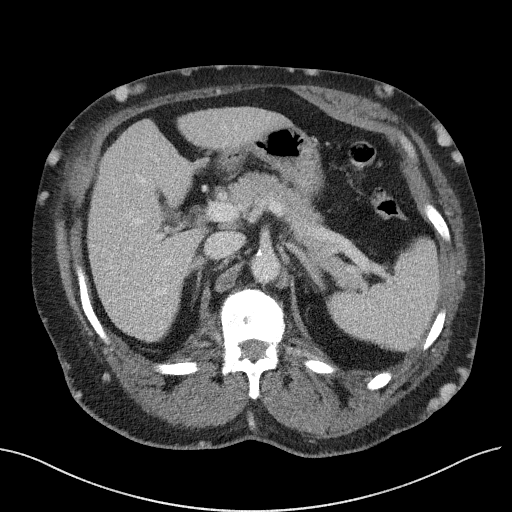
[im 74/99  lung]
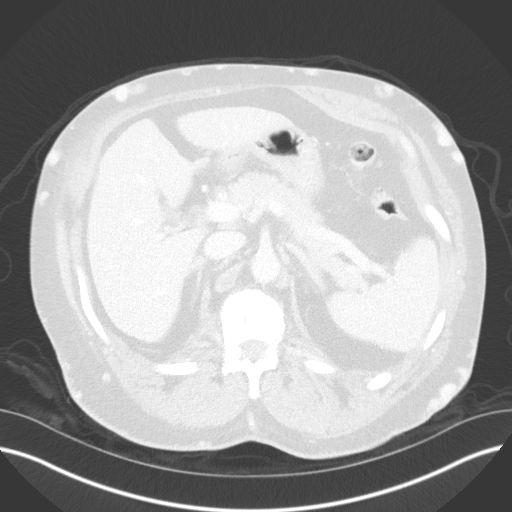
[im 86/99  soft-tissue]
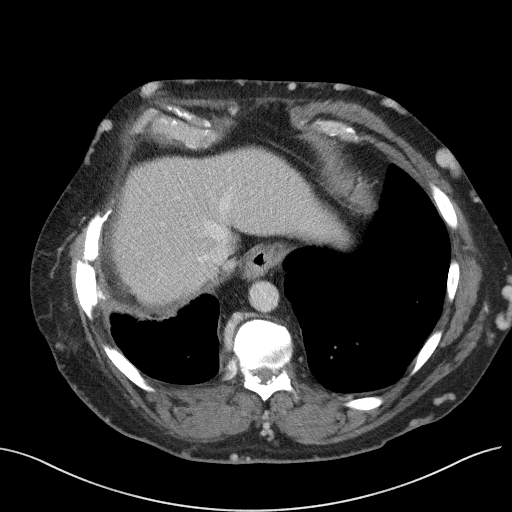
[im 86/99  lung]
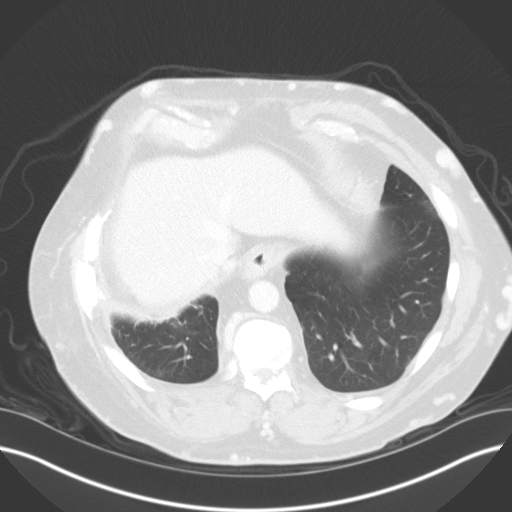

[Series 13: coronal venous mpr · coronal · portal-venous · 0.74mm/px · 2 of 163 slices shown, 3 images]
[im 55/163  soft-tissue]
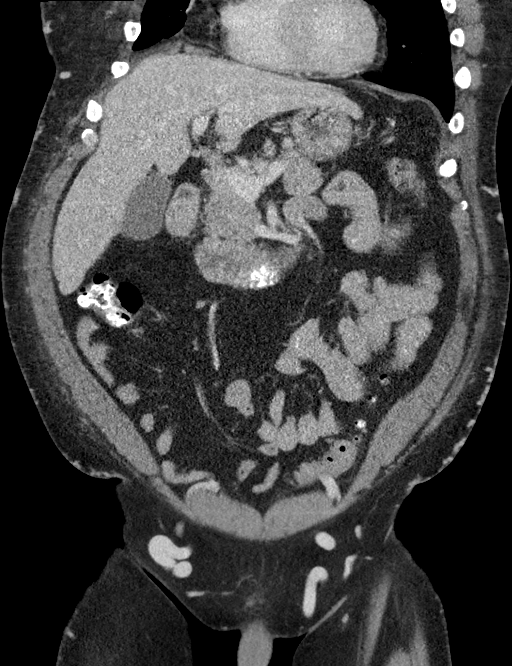
[im 55/163  bone]
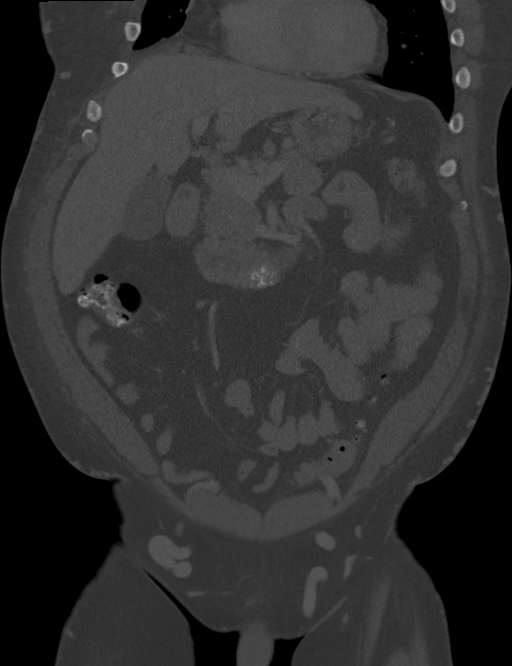
[im 109/163  soft-tissue]
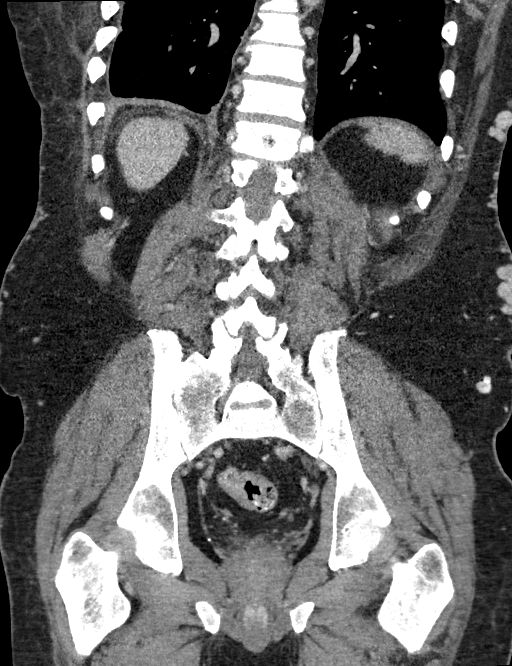

[9 of 46 positions shown; findings below may reference images not displayed]

FINDINGS: VASCULAR

Aorta: Normal caliber aorta without aneurysm, dissection, vasculitis
or significant stenosis. Aortic calcifications.

Celiac: Patent without evidence of aneurysm, dissection, vasculitis
or significant stenosis.

SMA: Patent without evidence of aneurysm, dissection, vasculitis or
significant stenosis.

Renals: Renal arteries are diminutive but appear minimally patent.
This may represent chronic renal artery stenosis.

IMA: Patent without evidence of aneurysm, dissection, vasculitis or
significant stenosis.

Inflow: Patent without evidence of aneurysm, dissection, vasculitis
or significant stenosis.

Proximal Outflow: Bilateral common femoral and visualized portions
of the superficial and profunda femoral arteries are patent without
evidence of aneurysm, dissection, vasculitis or significant
stenosis.

Veins: Multiple diffuse venous collaterals demonstrated throughout
the visualize lower chest and abdomen with early venous flow to the
left femoral vein, iliac veins, and IVC, presumably related to the
contrast injection on the left side. Inferior vena cava is not
abnormally distended. Presence of multiple collaterals raises
question of superior vena caval obstruction.

Review of the MIP images confirms the above findings.

NON-VASCULAR

Lower chest: Lung bases are clear.

Hepatobiliary: No focal liver abnormality is seen. No gallstones,
gallbladder wall thickening, or biliary dilatation.

Pancreas: Unremarkable. No pancreatic ductal dilatation or
surrounding inflammatory changes.

Spleen: Normal in size without focal abnormality.

Adrenals/Urinary Tract: No adrenal gland nodules. Horseshoe kidney
with bilateral renal cysts. No hydronephrosis or hydroureter. Renal
nephrograms are delayed and there is diffuse renal atrophy. This is
consistent with chronic renal disease. Bladder is decompressed.

Stomach/Bowel: Stomach and small bowel are mostly decompressed.
Contrast material in the colon. No colonic distention.
Diverticulosis of the sigmoid colon with stranding around the
sigmoid colon at the descending junction. Changes are consistent
with acute cholecystitis. No abscess.

Lymphatic: No abnormal abdominal lymphadenopathy. Scattered celiac
axis lymph nodes are likely reactive.

Reproductive: Prostate is unremarkable.

Other: There is a ventral anterior abdominal wall hernia containing
fat. No change since prior study. No free air or free fluid in the
abdomen.

Musculoskeletal: Diffuse bone sclerosis likely indicating renal
osteodystrophy.
IMPRESSION: VASCULAR

No aortic aneurysm or dissection. Aortic atherosclerosis. Multiple
venous collaterals demonstrated throughout the lower chest in
abdominal wall with extension to the groin regions bilaterally. This
raises the question of possible SVC obstruction. Similar appearance
to previous study. Renal arteries are somewhat diminutive,
suggesting chronic renal artery stenosis.

NON-VASCULAR

Diverticulosis of sigmoid colon with inflammatory stranding
consistent with acute diverticulitis. No abscess. Horseshoe kidney
with diffuse renal atrophy and delayed nephrogram consistent with
chronic renal disease. Diffuse bone sclerosis suggesting renal
osteodystrophy.

## 2018-06-04 IMAGING — US US HEPATIC LIVER DOPPLER
1 series · 14 of 25 positions shown · non-contrast
Comparison: None.

CLINICAL DATA: 56-year-old male with a history of superior vena
cava obstruction, abdominal wall collaterals

EXAM:
DUPLEX ULTRASOUND OF LIVER
TECHNIQUE: Color and duplex Doppler ultrasound was performed to evaluate the
hepatic in-flow and out-flow vessels.

[Series 1: us hepatic liver doppler · 0.22mm/px · 14 of 64 slices shown]
[im 1/64]
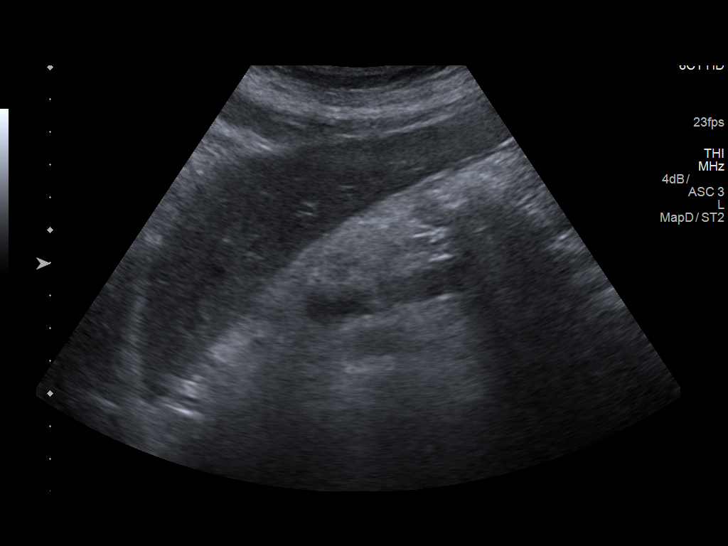
[im 6/64]
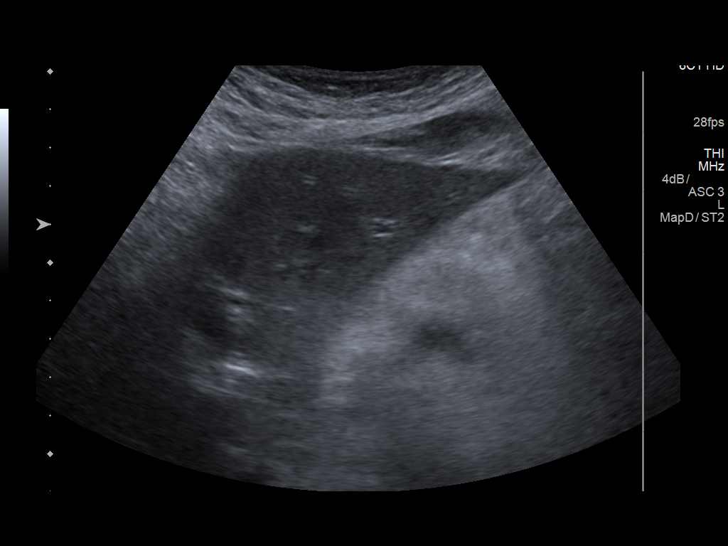
[im 11/64]
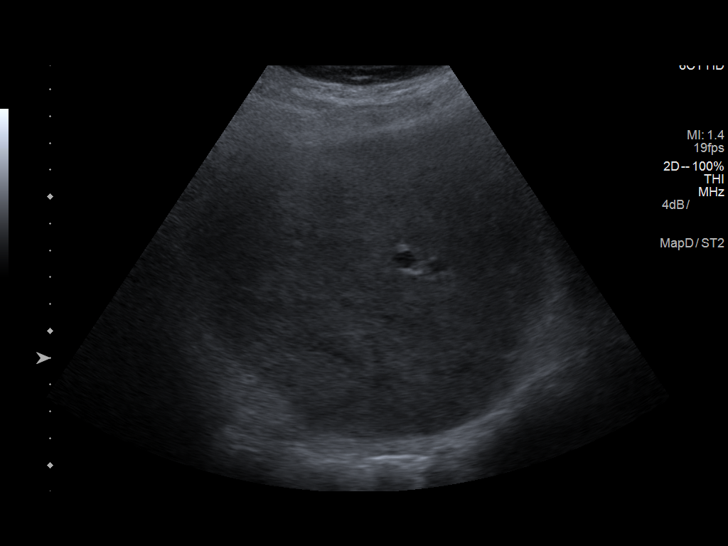
[im 16/64]
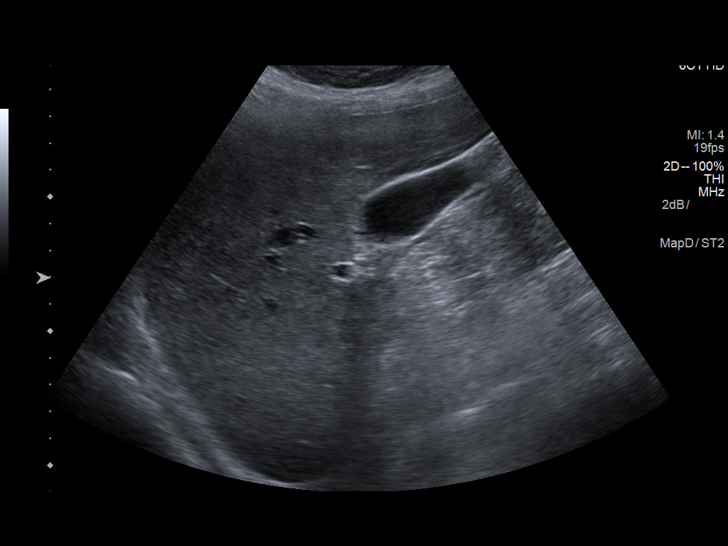
[im 22/64]
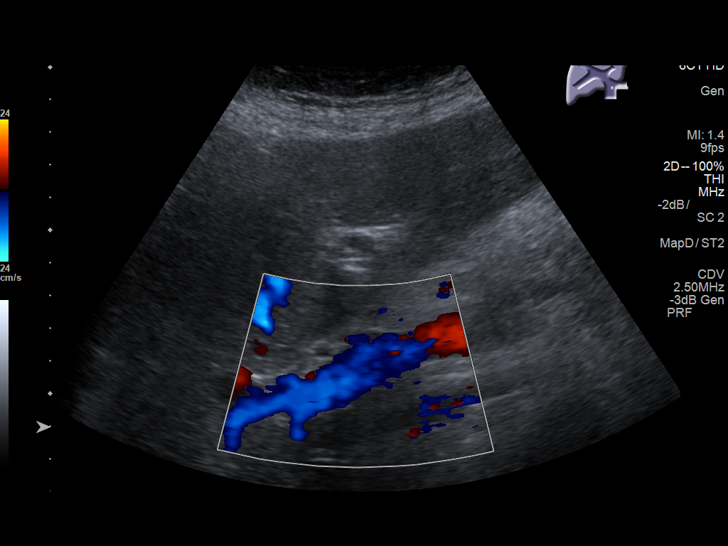
[im 24/64]
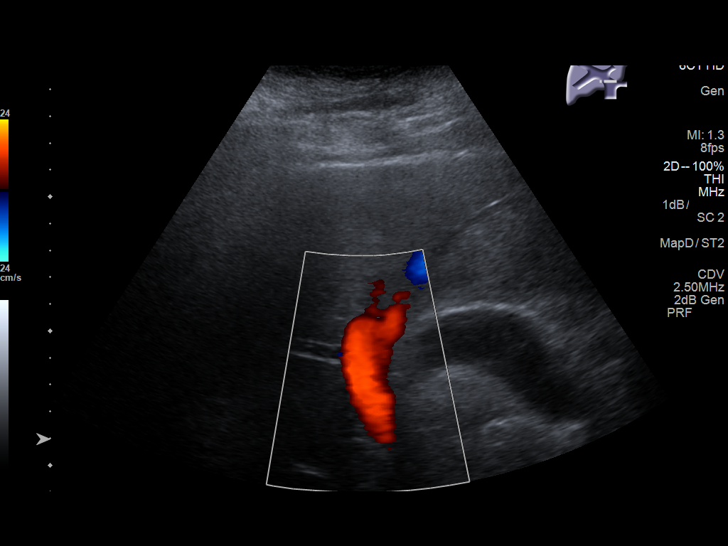
[im 29/64]
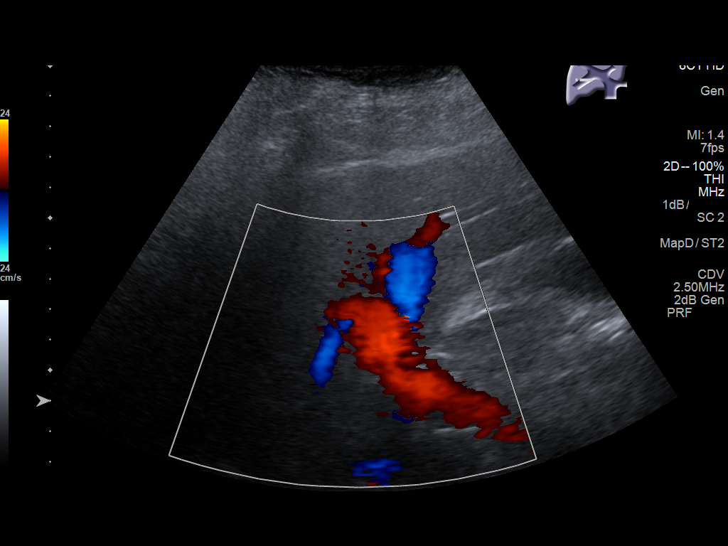
[im 35/64]
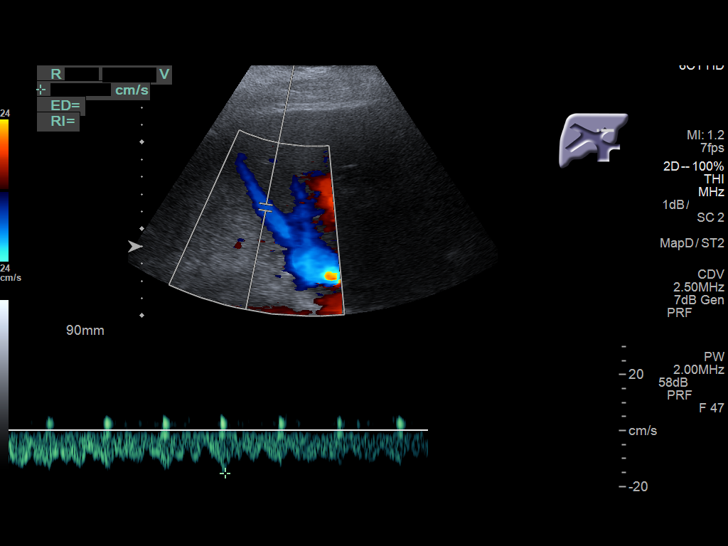
[im 40/64]
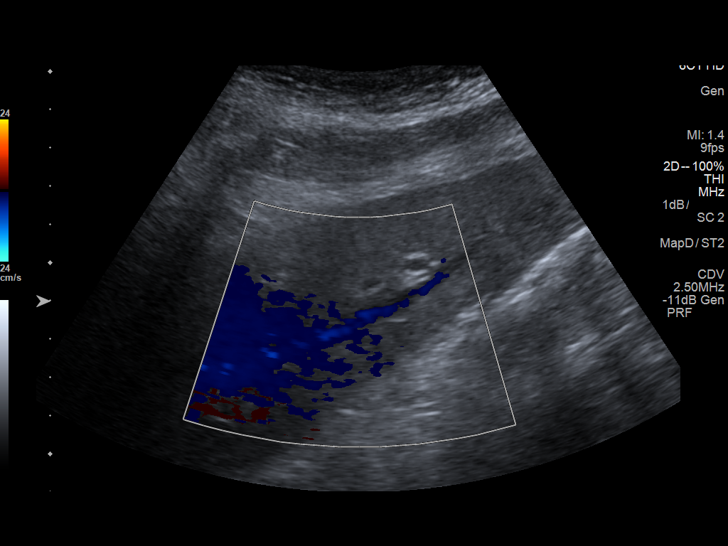
[im 43/64]
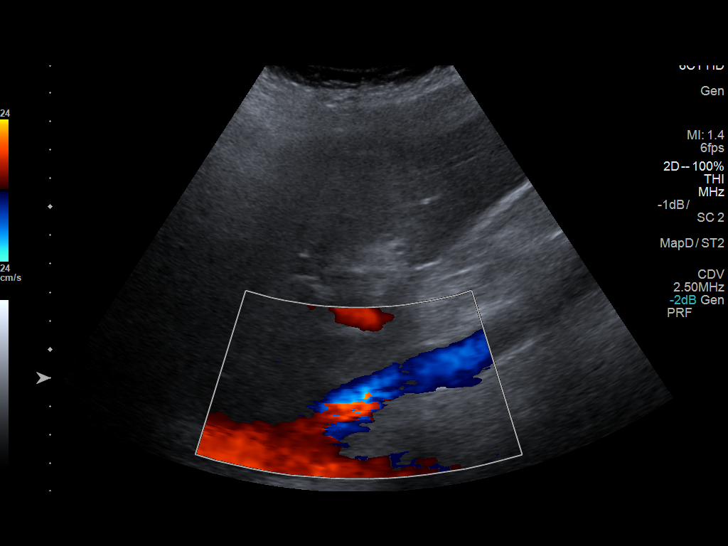
[im 48/64]
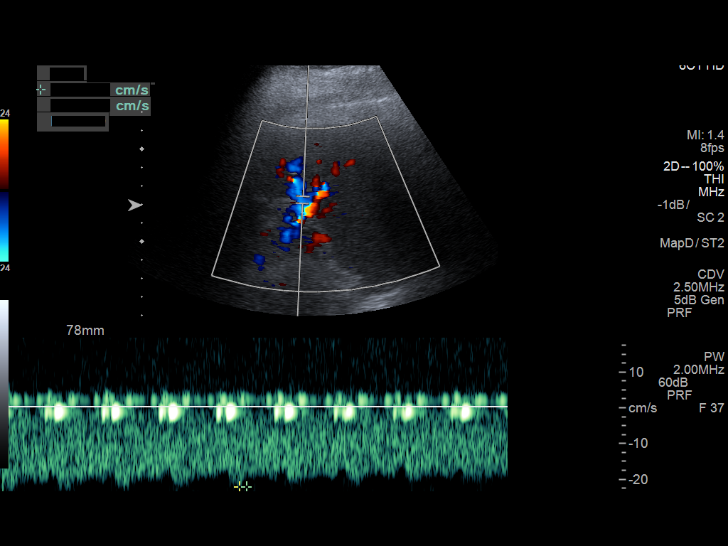
[im 53/64]
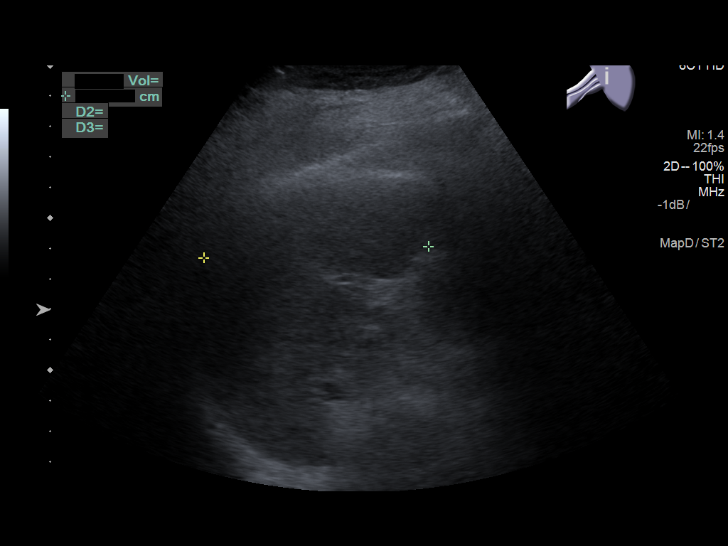
[im 58/64]
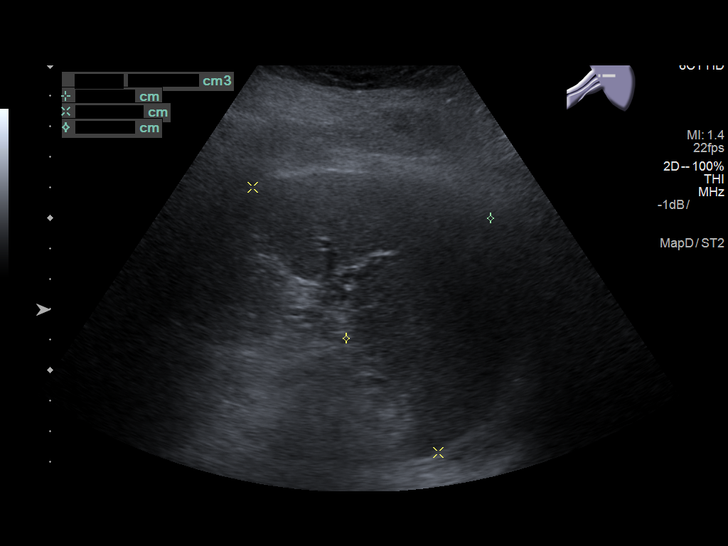
[im 64/64]
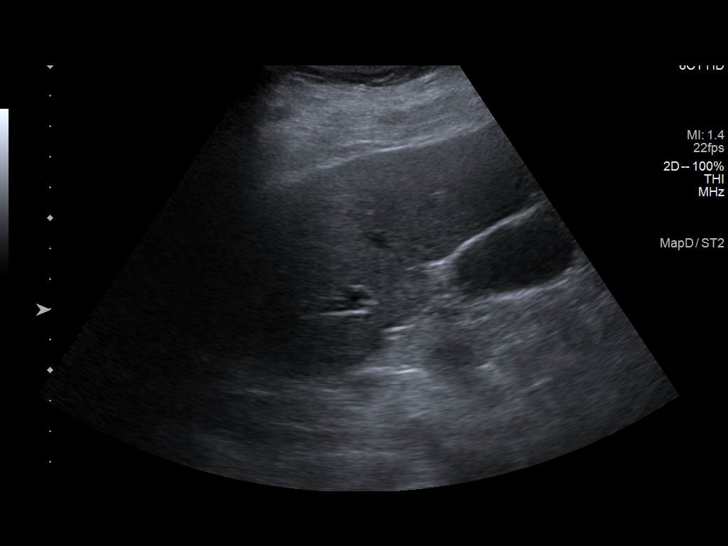

[14 of 25 positions shown; findings below may reference images not displayed]

FINDINGS: Portal Vein Velocities

Main:  56 cm/sec

Right:  38 cm/sec

Left:  45 cm/sec

Hepatic Vein Velocities

Right:  15 cm/sec

Middle:  45 cm/sec

Left:  14 cm/sec

Hepatic Artery Velocity:  127 cm/sec

Splenic Vein Velocity:  22 cm/sec

Varices: No varices.

Ascites: No ascites
IMPRESSION: Unremarkable hepatic Doppler study.

## 2018-07-12 ENCOUNTER — Other Ambulatory Visit: Payer: Self-pay

## 2018-07-12 DIAGNOSIS — Y69 Unspecified misadventure during surgical and medical care: Secondary | ICD-10-CM | POA: Insufficient documentation

## 2018-07-12 DIAGNOSIS — Z5321 Procedure and treatment not carried out due to patient leaving prior to being seen by health care provider: Secondary | ICD-10-CM | POA: Diagnosis not present

## 2018-07-12 DIAGNOSIS — T8249XA Other complication of vascular dialysis catheter, initial encounter: Secondary | ICD-10-CM | POA: Diagnosis not present

## 2018-07-12 NOTE — ED Triage Notes (Signed)
Patient has a femoral dialysis access and reports tonight noticed "stitch" had come out and that cath had come out some and needs to have it checked.Marland Kitchen

## 2018-07-13 ENCOUNTER — Emergency Department
Admission: EM | Admit: 2018-07-13 | Discharge: 2018-07-13 | Discharge status: Against Medical Advice | Payer: Medicare Other | Attending: Emergency Medicine | Admitting: Emergency Medicine

## 2018-07-13 NOTE — ED Provider Notes (Signed)
The patient left without being seen from the waiting room prior to my evaluation.  I never saw him.   Darel Hong, MD 07/17/18 1025

## 2018-07-13 NOTE — ED Notes (Signed)
Called for room, no answer

## 2018-07-14 ENCOUNTER — Ambulatory Visit: Admit: 2018-07-14 | Payer: Medicare Other | Admitting: Vascular Surgery

## 2018-07-14 ENCOUNTER — Other Ambulatory Visit: Payer: Self-pay

## 2018-07-14 ENCOUNTER — Encounter
Admission: EM | Disposition: A | Discharge status: Home / Self Care | Payer: Self-pay | Source: Home / Self Care | Attending: Emergency Medicine

## 2018-07-14 ENCOUNTER — Encounter: Payer: Self-pay | Admitting: Emergency Medicine

## 2018-07-14 ENCOUNTER — Emergency Department
Admission: EM | Admit: 2018-07-14 | Discharge: 2018-07-14 | Disposition: A | Discharge status: Home / Self Care | Payer: Medicare Other | Attending: Emergency Medicine | Admitting: Emergency Medicine

## 2018-07-14 ENCOUNTER — Other Ambulatory Visit (INDEPENDENT_AMBULATORY_CARE_PROVIDER_SITE_OTHER): Payer: Self-pay | Admitting: Vascular Surgery

## 2018-07-14 DIAGNOSIS — N186 End stage renal disease: Secondary | ICD-10-CM | POA: Diagnosis not present

## 2018-07-14 DIAGNOSIS — Z992 Dependence on renal dialysis: Secondary | ICD-10-CM | POA: Diagnosis not present

## 2018-07-14 DIAGNOSIS — I132 Hypertensive heart and chronic kidney disease with heart failure and with stage 5 chronic kidney disease, or end stage renal disease: Secondary | ICD-10-CM | POA: Diagnosis not present

## 2018-07-14 DIAGNOSIS — N2581 Secondary hyperparathyroidism of renal origin: Secondary | ICD-10-CM | POA: Diagnosis not present

## 2018-07-14 DIAGNOSIS — Y658 Other specified misadventures during surgical and medical care: Secondary | ICD-10-CM | POA: Insufficient documentation

## 2018-07-14 DIAGNOSIS — Z79899 Other long term (current) drug therapy: Secondary | ICD-10-CM | POA: Diagnosis not present

## 2018-07-14 DIAGNOSIS — I509 Heart failure, unspecified: Secondary | ICD-10-CM | POA: Diagnosis not present

## 2018-07-14 DIAGNOSIS — F17210 Nicotine dependence, cigarettes, uncomplicated: Secondary | ICD-10-CM | POA: Insufficient documentation

## 2018-07-14 DIAGNOSIS — T8242XA Displacement of vascular dialysis catheter, initial encounter: Secondary | ICD-10-CM | POA: Insufficient documentation

## 2018-07-14 SURGERY — DIALYSIS/PERMA CATHETER INSERTION
Anesthesia: Moderate Sedation

## 2018-07-14 NOTE — ED Notes (Signed)
First Nurse Note: Patient states "the nurse told me to come over here, my catheter is loose". Patient states he was at dialysis center this AM.

## 2018-07-14 NOTE — ED Triage Notes (Signed)
Pt arrived via POV by  Himself states his dialysis catheter came out. Pt has right femoral access, but states a stitch came out and he slid it back up.  Pt supposed to go to dialysis MWF.  Pt's last treatment was on Friday.

## 2018-07-14 NOTE — ED Provider Notes (Signed)
[facility] Emergency Department Provider Note   ____________________________________________    I have reviewed the triage vital signs and the nursing notes.   HISTORY  Chief Complaint Vascular Access Problem     HPI [name] [name] is a 57 y.o. male with history of end-stage renal disease who receives dialysis Monday Wednesday Friday, is currently using right femoral catheter for dialysis while awaiting maturation of right arm graft.  Patient reports he noted that the catheter had slipped out several inches last night, he pushed it back in without difficulty.  Today at dialysis they sent him to the emergency department for evaluation.  It was noted  that the patient is scheduled for vascular procedure today with Dr. Lucky Cowboy, patient does not seem to know anything about this  Past Medical History:  Diagnosis Date  . Anemia of chronic disease    BL Hgb ~10  . CHF (congestive heart failure) ([place])    pt denies 04/01/14  . Diverticulitis   . End-stage renal disease on hemodialysis ([place])    HD m/w/f - right arm graft  . History of blood transfusion   . Hypertension   . Pneumonia    hx of  . Pneumonia   . Polysubstance abuse ([place])   . Renal insufficiency   . Secondary hyperparathyroidism ([place])   . Sleep disturbance   . Tobacco abuse     Patient Active Problem List   Diagnosis Date Noted  . Diverticulitis large intestine 12/05/2017  . Diverticulitis of colon 04/24/2015  . Community acquired pneumonia 04/18/2015  . Sepsis ([place]) 04/16/2015  . Shortness of breath 07/07/2013  . Acidosis, metabolic, with respiratory acidosis 04/23/2013  . Acute post-thoracotomy pain 04/23/2013  . Hemorrhagic shock ([place]) 04/23/2013  . S/P sympathectomy 04/23/2013  . 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 Persistent air leak 03/18/2013  . Pneumothorax, right 03/18/2013  . S/P thoracotomy 03/04/2013  . Acute embolism and thrombosis of internal jugular  vein ([place]) 01/17/2013  . Bacteremia 01/06/2013  . Benign neoplasm of colon 12/16/2012  . Diverticulosis of colon (without mention of hemorrhage) 12/16/2012  . ESCRF (end stage chronic renal failure) ([place]) 12/12/2012  . H/O: hypertension 12/12/2012  . Pleural effusion, right 12/12/2012  . History of prolonged Q-T interval on ECG 12/12/2012  . Hyperkalemia 12/11/2012  . Acute blood loss anemia 12/11/2012  . Sinus tachycardia, resolved with PRBC resuscitation 12/11/2012  . Lower GI bleed 12/11/2012  . End-stage renal disease on hemodialysis ([place])   . Secondary hyperparathyroidism, renal ([place])   . Hypotension   . Polysubstance abuse ([place])   . Anemia of chronic disease   . Tobacco abuse     Past Surgical History:  Procedure Laterality Date  . APPENDECTOMY    . CHEST TUBE INSERTION  02/2013  . COLONOSCOPY  12/16/2012   Procedure: COLONOSCOPY;  Surgeon: Milus Banister, MD;  Location: [facility];  Service: Endoscopy;  Laterality: N/A;  . PLEURAL EFFUSION DRAINAGE  12/18/2012   Procedure: DRAINAGE OF PLEURAL EFFUSION;  Surgeon: Melrose Nakayama, MD;  Location: [facility];  Service: Thoracic;  Laterality: Right;  . surgery for horseshoe kidney    . TALC PLEURODESIS Right 02/05/2013   Procedure: Pietro Cassis;  Surgeon: Melrose Nakayama, MD;  Location: [facility];  Service: Thoracic;  Laterality: Right;  . TEE WITHOUT CARDIOVERSION N/A 01/16/2013   Procedure: TRANSESOPHAGEAL ECHOCARDIOGRAM (TEE);  Surgeon: Sanda Klein, MD;  Location: [facility];  Service: Cardiovascular;  Laterality: N/A;  .  VIDEO ASSISTED THORACOSCOPY  12/18/2012   Procedure: VIDEO ASSISTED THORACOSCOPY;  Surgeon: Melrose Nakayama, MD;  Location: [facility];  Service: Thoracic;  Laterality: Right;  pleural peel  . VIDEO ASSISTED THORACOSCOPY (VATS)/THOROCOTOMY Right 02/19/2013   Procedure: VIDEO ASSISTED THORACOSCOPY (VATS)/THOROCOTOMY;  Surgeon: Melrose Nakayama, MD;  Location: [facility];  Service: Thoracic;  Laterality:  Right;  Right Video Assisted Thoracoscopy, Right Thoracotomy, Decortication,   . VIDEO BRONCHOSCOPY WITH INSERTION OF INTERBRONCHIAL VALVE (IBV)  01/09/2013   Procedure: VIDEO BRONCHOSCOPY WITH INSERTION OF INTERBRONCHIAL VALVE (IBV);  Surgeon: Melrose Nakayama, MD;  Location: [facility];  Service: Thoracic;  Laterality: N/A;  placement of 2 Intrabronchial valves in right lower lobe  . VIDEO BRONCHOSCOPY WITH INSERTION OF INTERBRONCHIAL VALVE (IBV) Right 02/19/2013   Procedure: VIDEO BRONCHOSCOPY WITH INSERTION OF INTERBRONCHIAL VALVE (IBV);  Surgeon: Melrose Nakayama, MD;  Location: [facility];  Service: Thoracic;  Laterality: Right;  removal of two interbronchial valves right chest    Prior to Admission medications   Medication Sig Start Date End Date Taking? Authorizing Provider  acetaminophen (TYLENOL) 500 MG tablet Take 1,000 mg by mouth every 8 (eight) hours as needed for moderate pain.    [provider]  amoxicillin-clavulanate (AUGMENTIN) 500-125 MG tablet Take 1 tablet (500 mg total) by mouth daily. 12/26/17   Clay[place] Pert, MD  Bismuth Subsalicylate (KAOPECTATE PO) Take 15 mLs by mouth daily as needed (for nausea).    [provider]  calcium acetate (PHOSLO) 667 MG capsule Take 2,668 mg by mouth 3 (three) times daily with meals.     [provider]  cinacalcet (SENSIPAR) 90 MG tablet Take 90 mg by mouth daily.    [provider]  metoprolol succinate (TOPROL-XL) 50 MG 24 hr tablet Take 50 mg by mouth daily. Take with or immediately following a meal.    [provider]  ondansetron (ZOFRAN) 4 MG tablet Take 4 mg by mouth every 8 (eight) hours as needed. 04/21/15   [provider]  zolpidem (AMBIEN) 5 MG tablet Take by mouth.    [provider]     Allergies Other and Tape  Family History  Problem Relation Age of Onset  . Thyroid disease Mother   . Renal Disease Maternal Grandmother   . Diabetes Maternal Grandmother   .  Hypertension Maternal Aunt   . Hypertension Maternal Uncle     Social History Social History   Tobacco Use  . Smoking status: Current Every Day Smoker    Packs/day: 6.00    Years: 20.00    Pack years: 120.00    Types: Cigarettes  . Smokeless tobacco: Current User  Substance Use Topics  . Alcohol use: Yes    Alcohol/week: 1.8 oz    Types: 3 Cans of beer per week    Comment: occasional  . Drug use: Yes    Types: Marijuana    Comment: remote cocaine abuse, ongoing marijuana use.    Review of Systems  Constitutional: No fevers Eyes: No visual changes.  ENT: No sore throat. Cardiovascular: Denies chest pain. Respiratory: Denies shortness of breath. Gastrointestinal: No abdominal pain.  No nausea, no vomiting.   Genitourinary: Does not make urine Musculoskeletal: Negative for back pain. Skin: No rash Neurological: Negative for headaches   ____________________________________________   PHYSICAL EXAM:  VITAL SIGNS: ED Triage Vitals  Enc Vitals Group     BP 07/14/18 0929 132/68     Pulse Rate 07/14/18 0929 77  Resp 07/14/18 0929 16     Temp 07/14/18 0929 97.9 F (36.6 C)     Temp Source 07/14/18 0929 Oral     SpO2 07/14/18 0929 100 %     Weight 07/14/18 0941 96.6 kg (213 lb)     Height 07/14/18 0941 1.854 m (6\' 1" )     Head Circumference --      Peak Flow --      Pain Score 07/14/18 0952 0     Pain Loc --      Pain Edu? --      Excl. in [place]? --     Constitutional: Alert and oriented. No acute distress.   Nose: No congestion/rhinnorhea. Mouth/Throat: Mucous membranes are moist.    Cardiovascular: Normal rate, regular rhythm. Grossly normal heart sounds.  Good peripheral circulation. Respiratory: Normal respiratory effort.  No retractions. Lungs CTAB. Gastrointestinal: Soft and nontender. No distention.  Genitourinary: Right femoral catheter appears appropriately placed no surrounding erythema or rash Musculoskeletal: No lower extremity tenderness nor  edema.   Neurologic:  Normal speech and language. No gross focal neurologic deficits are appreciated.  Skin:  Skin is warm, dry and intact. No rash noted. Psychiatric: Mood and affect are normal. Speech and behavior are normal.  ____________________________________________   LABS (all labs ordered are listed, but only abnormal results are displayed)  Labs Reviewed - No data to display ____________________________________________  EKG  None ____________________________________________  RADIOLOGY  KUB ordered but canceled ____________________________________________   PROCEDURES  Procedure(s) performed: No  Procedures   Critical Care performed: No ____________________________________________   INITIAL IMPRESSION / ASSESSMENT AND PLAN / ED COURSE  Pertinent labs & imaging results that were available during my care of the patient were reviewed by me and considered in my medical decision making (see chart for details).  discussed with Dr. Bunnie Domino staff who notes the patient is scheduled today for a catheter replacement conveniently.  We will send the patient to special procedures for catheter replacement    ____________________________________________   FINAL CLINICAL IMPRESSION(S) / ED DIAGNOSES  Final diagnoses:  Displacement of vascular dialysis catheter, initial encounter [facility])        Note:  This document was prepared using Dragon voice recognition software and may include unintentional dictation errors.    Lavonia Drafts, MD 07/14/18 1041

## 2018-09-05 ENCOUNTER — Emergency Department
Admission: EM | Admit: 2018-09-05 | Discharge: 2018-09-05 | Disposition: A | Discharge status: Home / Self Care | Payer: Medicare Other | Attending: Emergency Medicine | Admitting: Emergency Medicine

## 2018-09-05 ENCOUNTER — Encounter: Payer: Self-pay | Admitting: Emergency Medicine

## 2018-09-05 DIAGNOSIS — I120 Hypertensive chronic kidney disease with stage 5 chronic kidney disease or end stage renal disease: Secondary | ICD-10-CM | POA: Insufficient documentation

## 2018-09-05 DIAGNOSIS — N186 End stage renal disease: Secondary | ICD-10-CM | POA: Diagnosis not present

## 2018-09-05 DIAGNOSIS — F17210 Nicotine dependence, cigarettes, uncomplicated: Secondary | ICD-10-CM | POA: Diagnosis not present

## 2018-09-05 DIAGNOSIS — L02212 Cutaneous abscess of back [any part, except buttock]: Secondary | ICD-10-CM | POA: Diagnosis not present

## 2018-09-05 DIAGNOSIS — Z79899 Other long term (current) drug therapy: Secondary | ICD-10-CM | POA: Insufficient documentation

## 2018-09-05 DIAGNOSIS — Z992 Dependence on renal dialysis: Secondary | ICD-10-CM | POA: Insufficient documentation

## 2018-09-05 MED ORDER — LIDOCAINE HCL (PF) 1 % IJ SOLN
5.0000 mL | Freq: Once | INTRAMUSCULAR | Status: AC
  Administered 2018-09-05: 5 mL
  Filled 2018-09-05: qty 5

## 2018-09-05 MED ORDER — OXYCODONE HCL 5 MG PO TABS: 5.0000 mg | ORAL_TABLET | Freq: Four times a day (QID) | ORAL | 0 refills | Status: DC | PRN

## 2018-09-05 NOTE — ED Provider Notes (Signed)
[facility] Emergency Department Provider Note  ____________________________________________   First MD Initiated Contact with Patient 09/05/18 1345     (approximate)  I have reviewed the triage vital signs and the nursing notes.   HISTORY  Chief Complaint Abscess   HPI [name] [name] is a 57 y.o. male presents to the ED with complaint of abscess to his back for the last 2 weeks.  He states that his dialysis doctor put him on antibiotics for this.  He also was pushing to try and get pus out and was told that he needed to come to the ED because he did not have a pair hemostats.  Patient denies any fever or chills.  He denies any previous abscess in this area.  Currently he denies any pain unless someone is touching it.  Past Medical History:  Diagnosis Date  . Anemia of chronic disease    BL Hgb ~10  . CHF (congestive heart failure) ([place])    pt denies 04/01/14  . Diverticulitis   . End-stage renal disease on hemodialysis ([place])    HD m/w/f - right arm graft  . History of blood transfusion   . Hypertension   . Pneumonia    hx of  . Pneumonia   . Polysubstance abuse ([place])   . Renal insufficiency   . Secondary hyperparathyroidism ([place])   . Sleep disturbance   . Tobacco abuse     Patient Active Problem List   Diagnosis Date Noted  . Diverticulitis large intestine 12/05/2017  . Diverticulitis of colon 04/24/2015  . Community acquired pneumonia 04/18/2015  . Sepsis ([place]) 04/16/2015  . Shortness of breath 07/07/2013  . Acidosis, metabolic, with respiratory acidosis 04/23/2013  . Acute post-thoracotomy pain 04/23/2013  . Hemorrhagic shock ([place]) 04/23/2013  . S/P sympathectomy 04/23/2013  . 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 Persistent air leak 03/18/2013  . Pneumothorax, right 03/18/2013  . S/P thoracotomy 03/04/2013  . 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 ([place]) 01/17/2013  . Bacteremia 01/06/2013  . Benign  neoplasm of colon 12/16/2012  . Diverticulosis of colon (without mention of hemorrhage) 12/16/2012  . ESCRF (end stage chronic renal failure) ([place]) 12/12/2012  . H/O: hypertension 12/12/2012  . Pleural effusion, right 12/12/2012  . History of prolonged Q-T interval on ECG 12/12/2012  . Hyperkalemia 12/11/2012  . Acute blood loss anemia 12/11/2012  . Sinus tachycardia, resolved with PRBC resuscitation 12/11/2012  . Lower GI bleed 12/11/2012  . End-stage renal disease on hemodialysis ([place])   . Secondary hyperparathyroidism, renal ([place])   . Hypotension   . Polysubstance abuse ([place])   . Anemia of chronic disease   . Tobacco abuse     Past Surgical History:  Procedure Laterality Date  . APPENDECTOMY    . CHEST TUBE INSERTION  02/2013  . COLONOSCOPY  12/16/2012   Procedure: COLONOSCOPY;  Surgeon: Milus Banister, MD;  Location: [place];  Service: Endoscopy;  Laterality: N/A;  . PLEURAL EFFUSION DRAINAGE  12/18/2012   Procedure: DRAINAGE OF PLEURAL EFFUSION;  Surgeon: Melrose Nakayama, MD;  Location: [place];  Service: Thoracic;  Laterality: Right;  . surgery for horseshoe kidney    . TALC PLEURODESIS Right 02/05/2013   Procedure: Pietro Cassis;  Surgeon: Melrose Nakayama, MD;  Location: [place];  Service: Thoracic;  Laterality: Right;  . TEE WITHOUT CARDIOVERSION N/A 01/16/2013   Procedure: TRANSESOPHAGEAL ECHOCARDIOGRAM (TEE);  Surgeon: Sanda Klein, MD;  Location: [place];  Service:  Cardiovascular;  Laterality: N/A;  . VIDEO ASSISTED THORACOSCOPY  12/18/2012   Procedure: VIDEO ASSISTED THORACOSCOPY;  Surgeon: Melrose Nakayama, MD;  Location: [place];  Service: Thoracic;  Laterality: Right;  pleural peel  . VIDEO ASSISTED THORACOSCOPY (VATS)/THOROCOTOMY Right 02/19/2013   Procedure: VIDEO ASSISTED THORACOSCOPY (VATS)/THOROCOTOMY;  Surgeon: Melrose Nakayama, MD;  Location: [place];  Service: Thoracic;  Laterality: Right;  Right Video Assisted Thoracoscopy, Right Thoracotomy,  Decortication,   . VIDEO BRONCHOSCOPY WITH INSERTION OF INTERBRONCHIAL VALVE (IBV)  01/09/2013   Procedure: VIDEO BRONCHOSCOPY WITH INSERTION OF INTERBRONCHIAL VALVE (IBV);  Surgeon: Melrose Nakayama, MD;  Location: [place];  Service: Thoracic;  Laterality: N/A;  placement of 2 Intrabronchial valves in right lower lobe  . VIDEO BRONCHOSCOPY WITH INSERTION OF INTERBRONCHIAL VALVE (IBV) Right 02/19/2013   Procedure: VIDEO BRONCHOSCOPY WITH INSERTION OF INTERBRONCHIAL VALVE (IBV);  Surgeon: Melrose Nakayama, MD;  Location: [place];  Service: Thoracic;  Laterality: Right;  removal of two interbronchial valves right chest    Prior to Admission medications   Medication Sig Start Date End Date Taking? Authorizing Provider  acetaminophen (TYLENOL) 500 MG tablet Take 1,000 mg by mouth every 8 (eight) hours as needed for moderate pain.    [provider]  amoxicillin-clavulanate (AUGMENTIN) 500-125 MG tablet Take 1 tablet (500 mg total) by mouth daily. 12/26/17   Clayburn Pert, MD  Bismuth Subsalicylate (KAOPECTATE PO) Take 15 mLs by mouth daily as needed (for nausea).    [provider]  calcium acetate (PHOSLO) 667 MG capsule Take 2,668 mg by mouth 3 (three) times daily with meals.     [provider]  cinacalcet (SENSIPAR) 90 MG tablet Take 90 mg by mouth daily.    [provider]  metoprolol succinate (TOPROL-XL) 50 MG 24 hr tablet Take 50 mg by mouth daily. Take with or immediately following a meal.    [provider]  ondansetron (ZOFRAN) 4 MG tablet Take 4 mg by mouth every 8 (eight) hours as needed. 04/21/15   [provider]  oxyCODONE (ROXICODONE) 5 MG immediate release tablet Take 1 tablet (5 mg total) by mouth every 6 (six) hours as needed for moderate pain. 09/05/18 09/05/19  Johnn Hai, PA-C  zolpidem (AMBIEN) 5 MG tablet Take by mouth.    [provider]    Allergies Other and Tape  Family History  Problem Relation  Age of Onset  . Thyroid disease Mother   . Renal Disease Maternal Grandmother   . Diabetes Maternal Grandmother   . Hypertension Maternal Aunt   . Hypertension Maternal Uncle     Social History Social History   Tobacco Use  . Smoking status: Current Every Day Smoker    Packs/day: 6.00    Years: 20.00    Pack years: 120.00    Types: Cigarettes  . Smokeless tobacco: Current User  Substance Use Topics  . Alcohol use: Yes    Alcohol/week: 3.0 standard drinks    Types: 3 Cans of beer per week    Comment: occasional  . Drug use: Yes    Types: Marijuana    Comment: remote cocaine abuse, ongoing marijuana use.    Review of Systems Constitutional: No fever/chills Cardiovascular: Denies chest pain. Respiratory: Denies shortness of breath. Gastrointestinal:   No nausea, no vomiting.  Musculoskeletal: Negative for back pain. Skin: Positive for abscess. Neurological: Negative for  focal weakness or numbness. ___________________________________________   PHYSICAL EXAM:  VITAL SIGNS: ED Triage Vitals  Enc Vitals Group     BP 09/05/18 1249 127/83     Pulse Rate 09/05/18 1249 78     Resp 09/05/18 1249 16     Temp 09/05/18 1249 (!) 97.5 F (36.4 C)     Temp Source 09/05/18 1249 Oral     SpO2 09/05/18 1249 99 %     Weight 09/05/18 1246 209 lb 7 oz (95 kg)     Height 09/05/18 1246 6\' 1"  (1.854 m)     Head Circumference --      Peak Flow --      Pain Score 09/05/18 1246 0     Pain Loc --      Pain Edu? --      Excl. in [place]? --     Constitutional: Alert and oriented. Well appearing and in no acute distress. Eyes: Conjunctivae are normal.  Head: Atraumatic. Neck: No stridor.   Cardiovascular: Normal rate, regular rhythm. Grossly normal heart sounds.  Good peripheral circulation. Respiratory: Normal respiratory effort.  No retractions. Lungs CTAB. Musculoskeletal: Moves upper and lower extremities that any difficulty. Neurologic:  Normal speech and language. No gross focal  neurologic deficits are appreciated.  Skin:  Skin is warm, dry.  On examination of the back there is an abscess that is open and draining.  There are 2 separate openings with some purulent drainage.  Area is markedly tender to palpation.  Area is located thoracic area of the back.  No extending cellulitis is present. Psychiatric: Mood and affect are normal. Speech and behavior are normal.  ____________________________________________   LABS (all labs ordered are listed, but only abnormal results are displayed)  Labs Reviewed - No data to display  PROCEDURES  Procedure(s) performed:   Marland KitchenMarland KitchenIncision and Drainage Date/Time: 09/05/2018 4:46 PM Performed by: Johnn Hai, PA-C Authorized by: Johnn Hai, PA-C   Consent:    Consent obtained:  Verbal   Consent given by:  Patient   Risks discussed:  Pain and infection   Alternatives discussed:  Referral Location:    Type:  Abscess   Location:  Trunk   Trunk location:  Back Pre-procedure details:    Skin preparation:  Antiseptic wash and Betadine Anesthesia (see MAR for exact dosages):    Anesthesia method:  Local infiltration   Local anesthetic:  Lidocaine 1% w/o epi Procedure type:    Complexity:  Simple Procedure details:    Needle aspiration: no     Incision types:  Single straight   Incision depth:  Dermal   Scalpel blade:  11   Wound management:  Irrigated with saline   Drainage:  Purulent   Drainage amount:  Scant   Wound treatment:  Drain placed   Packing materials:  1/4 in iodoform gauze   Amount 1/4" iodoform:  2.5 cm Post-procedure details:    Patient tolerance of procedure:  Tolerated well, no immediate complications    Critical Care performed: No  ____________________________________________   INITIAL IMPRESSION / ASSESSMENT AND PLAN / ED COURSE  As part of my medical decision making, I reviewed the following data within the electronic MEDICAL RECORD NUMBER Notes from prior ED visits and [place] Controlled  Substance Database  Patient presents to the ED with abscess to his back.  He has taken antibiotics that were prescribed to him by his dialysis doctor.  Also an attempt to drain this in the office has already been made today.  Patient was given a prescription for oxycodone to take as needed for  pain.  He is instructed to use warm compresses to the area frequently.  He is to return to the ED or see his PCP in 2 days for removal of the drain if it has not fallen out on its own.  He also is to follow-up with the surgeon listed on his discharge instructions as I feel that the abscess is possibly tracking.  Patient is aware that he should return to the emergency room sooner if any complications such as fever or chills over the weekend. ____________________________________________   FINAL CLINICAL IMPRESSION(S) / ED DIAGNOSES  Final diagnoses:  Abscess of upper back excluding scapular region     ED Discharge Orders         Ordered    oxyCODONE (ROXICODONE) 5 MG immediate release tablet  Every 6 hours PRN     09/05/18 1503           Note:  This document was prepared using Dragon voice recognition software and may include unintentional dictation errors.    Johnn Hai, PA-C 09/05/18 1649    Lavonia Drafts, MD 09/06/18 [phone]

## 2018-09-05 NOTE — Discharge Instructions (Signed)
Follow up with Dr. Lysle Pearl for removal of your infected cyst.  You will need to return to the ED in 2 days for removal of the packing that is in the abscess if it has not fallen out on its own.  Take pain medication only as directed.  You may also use warm compresses to the area frequently.  Return to the ED if any fever, chills, nausea or vomiting.

## 2018-09-05 NOTE — ED Triage Notes (Signed)
Pt reports abscess on his back for the past 2 weeks. Pt reports MD tried to push it out but was told they needed hemostats to do that to come to the ED.

## 2018-09-10 ENCOUNTER — Emergency Department
Admission: EM | Admit: 2018-09-10 | Discharge: 2018-09-10 | Disposition: A | Discharge status: Home / Self Care | Payer: Medicare Other | Attending: Emergency Medicine | Admitting: Emergency Medicine

## 2018-09-10 ENCOUNTER — Encounter: Payer: Self-pay | Admitting: Emergency Medicine

## 2018-09-10 DIAGNOSIS — Z992 Dependence on renal dialysis: Secondary | ICD-10-CM | POA: Insufficient documentation

## 2018-09-10 DIAGNOSIS — Z5189 Encounter for other specified aftercare: Secondary | ICD-10-CM

## 2018-09-10 DIAGNOSIS — F17210 Nicotine dependence, cigarettes, uncomplicated: Secondary | ICD-10-CM | POA: Insufficient documentation

## 2018-09-10 DIAGNOSIS — I120 Hypertensive chronic kidney disease with stage 5 chronic kidney disease or end stage renal disease: Secondary | ICD-10-CM | POA: Diagnosis not present

## 2018-09-10 DIAGNOSIS — Z4801 Encounter for change or removal of surgical wound dressing: Secondary | ICD-10-CM | POA: Insufficient documentation

## 2018-09-10 DIAGNOSIS — Z79899 Other long term (current) drug therapy: Secondary | ICD-10-CM | POA: Diagnosis not present

## 2018-09-10 DIAGNOSIS — L02413 Cutaneous abscess of right upper limb: Secondary | ICD-10-CM | POA: Insufficient documentation

## 2018-09-10 DIAGNOSIS — N186 End stage renal disease: Secondary | ICD-10-CM | POA: Diagnosis not present

## 2018-09-10 MED ORDER — CLINDAMYCIN HCL 300 MG PO CAPS: 300.0000 mg | ORAL_CAPSULE | Freq: Four times a day (QID) | ORAL | 0 refills | Status: DC

## 2018-09-10 MED ORDER — CLINDAMYCIN HCL 150 MG PO CAPS
300.0000 mg | ORAL_CAPSULE | Freq: Once | ORAL | Status: AC
  Administered 2018-09-10: 300 mg via ORAL
  Filled 2018-09-10: qty 2

## 2018-09-10 NOTE — ED Triage Notes (Signed)
Patient presents to the ED to have packing removed from lanced abscess on his back.  Patient states he was supposed to return on Monday but has been unable to return until today.  Patient states area feels uncomfortable due to packing.

## 2018-09-10 NOTE — ED Provider Notes (Signed)
[facility] Emergency Department Provider Note  ____________________________________________  Time seen: Approximately 7:37 PM  I have reviewed the triage vital signs and the nursing notes.   HISTORY  Chief Complaint Wound Check    HPI [name] [name] is a 57 y.o. male who presents the emergency department for wound check of an abscess to his right scapular region.  Patient was originally seen in this department with incision and drainage 5 days prior.  Patient reports that he had been placed on antibiotics, unknown antibiotic by his nephrologist for cellulitis since last abscess.  Patient has finished antibiotic but reports that wound keeps draining.  Patient is unsure whether packing has fallen out or still in place.  Patient denies any fevers or chills, nausea or vomiting, abdominal pain.  Patient is a dialysis patient, receiving dialysis Monday, Wednesday, Friday.  Patient completed dialysis today with no complications.  Patient has a history of anemia, CHF, diverticulitis, end-stage renal disease with dialysis, hypertension, hyperparathyroidism.  No complaints of chronic medical problems.    Past Medical History:  Diagnosis Date  . Anemia of chronic disease    BL Hgb ~10  . CHF (congestive heart failure) ([place])    pt denies 04/01/14  . Diverticulitis   . End-stage renal disease on hemodialysis ([place])    HD m/w/f - right arm graft  . History of blood transfusion   . Hypertension   . Pneumonia    hx of  . Pneumonia   . Polysubstance abuse ([place])   . Renal insufficiency   . Secondary hyperparathyroidism ([place])   . Sleep disturbance   . Tobacco abuse     Patient Active Problem List   Diagnosis Date Noted  . Diverticulitis large intestine 12/05/2017  . Diverticulitis of colon 04/24/2015  . Community acquired pneumonia 04/18/2015  . Sepsis ([place]) 04/16/2015  . Shortness of breath 07/07/2013  . Acidosis, metabolic, with respiratory acidosis 04/23/2013  .  Acute post-thoracotomy pain 04/23/2013  . Hemorrhagic shock ([place]) 04/23/2013  . S/P sympathectomy 04/23/2013  . 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 Persistent air leak 03/18/2013  . Pneumothorax, right 03/18/2013  . S/P thoracotomy 03/04/2013  . 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 ([place]) 01/17/2013  . Bacteremia 01/06/2013  . Benign neoplasm of colon 12/16/2012  . Diverticulosis of colon (without mention of hemorrhage) 12/16/2012  . ESCRF (end stage chronic renal failure) ([place]) 12/12/2012  . H/O: hypertension 12/12/2012  . Pleural effusion, right 12/12/2012  . History of prolonged Q-T interval on ECG 12/12/2012  . Hyperkalemia 12/11/2012  . Acute blood loss anemia 12/11/2012  . Sinus tachycardia, resolved with PRBC resuscitation 12/11/2012  . Lower GI bleed 12/11/2012  . End-stage renal disease on hemodialysis ([place])   . Secondary hyperparathyroidism, renal ([place])   . Hypotension   . Polysubstance abuse ([place])   . Anemia of chronic disease   . Tobacco abuse     Past Surgical History:  Procedure Laterality Date  . APPENDECTOMY    . CHEST TUBE INSERTION  02/2013  . COLONOSCOPY  12/16/2012   Procedure: COLONOSCOPY;  Surgeon: Milus Banister, MD;  Location: [place];  Service: Endoscopy;  Laterality: N/A;  . PLEURAL EFFUSION DRAINAGE  12/18/2012   Procedure: DRAINAGE OF PLEURAL EFFUSION;  Surgeon: Melrose Nakayama, MD;  Location: [place];  Service: Thoracic;  Laterality: Right;  . surgery for horseshoe kidney    . TALC PLEURODESIS Right 02/05/2013   Procedure: Pietro Cassis;  Surgeon: Remo Lipps  Chaya Jan, MD;  Location: [place];  Service: Thoracic;  Laterality: Right;  . TEE WITHOUT CARDIOVERSION N/A 01/16/2013   Procedure: TRANSESOPHAGEAL ECHOCARDIOGRAM (TEE);  Surgeon: Sanda Klein, MD;  Location: [facility] ENDOSCOPY;  Service: Cardiovascular;  Laterality: N/A;  . VIDEO ASSISTED THORACOSCOPY  12/18/2012   Procedure: VIDEO ASSISTED THORACOSCOPY;   Surgeon: Melrose Nakayama, MD;  Location: [place];  Service: Thoracic;  Laterality: Right;  pleural peel  . VIDEO ASSISTED THORACOSCOPY (VATS)/THOROCOTOMY Right 02/19/2013   Procedure: VIDEO ASSISTED THORACOSCOPY (VATS)/THOROCOTOMY;  Surgeon: Melrose Nakayama, MD;  Location: [place];  Service: Thoracic;  Laterality: Right;  Right Video Assisted Thoracoscopy, Right Thoracotomy, Decortication,   . VIDEO BRONCHOSCOPY WITH INSERTION OF INTERBRONCHIAL VALVE (IBV)  01/09/2013   Procedure: VIDEO BRONCHOSCOPY WITH INSERTION OF INTERBRONCHIAL VALVE (IBV);  Surgeon: Melrose Nakayama, MD;  Location: [place];  Service: Thoracic;  Laterality: N/A;  placement of 2 Intrabronchial valves in right lower lobe  . VIDEO BRONCHOSCOPY WITH INSERTION OF INTERBRONCHIAL VALVE (IBV) Right 02/19/2013   Procedure: VIDEO BRONCHOSCOPY WITH INSERTION OF INTERBRONCHIAL VALVE (IBV);  Surgeon: Melrose Nakayama, MD;  Location: [place];  Service: Thoracic;  Laterality: Right;  removal of two interbronchial valves right chest    Prior to Admission medications   Medication Sig Start Date End Date Taking? Authorizing Provider  acetaminophen (TYLENOL) 500 MG tablet Take 1,000 mg by mouth every 8 (eight) hours as needed for moderate pain.    [provider]  amoxicillin-clavulanate (AUGMENTIN) 500-125 MG tablet Take 1 tablet (500 mg total) by mouth daily. 12/26/17   Clayburn Pert, MD  Bismuth Subsalicylate (KAOPECTATE PO) Take 15 mLs by mouth daily as needed (for nausea).    [provider]  calcium acetate (PHOSLO) 667 MG capsule Take 2,668 mg by mouth 3 (three) times daily with meals.     [provider]  cinacalcet (SENSIPAR) 90 MG tablet Take 90 mg by mouth daily.    [provider]  clindamycin (CLEOCIN) 300 MG capsule Take 1 capsule (300 mg total) by mouth 4 (four) times daily. 09/10/18   Jianni Batten, Charline Bills, PA-C  metoprolol succinate (TOPROL-XL) 50 MG 24 hr tablet Take 50 mg by mouth  daily. Take with or immediately following a meal.    [provider]  ondansetron (ZOFRAN) 4 MG tablet Take 4 mg by mouth every 8 (eight) hours as needed. 04/21/15   [provider]  oxyCODONE (ROXICODONE) 5 MG immediate release tablet Take 1 tablet (5 mg total) by mouth every 6 (six) hours as needed for moderate pain. 09/05/18 09/05/19  Johnn Hai, PA-C  zolpidem (AMBIEN) 5 MG tablet Take by mouth.    [provider]    Allergies Other and Tape  Family History  Problem Relation Age of Onset  . Thyroid disease Mother   . Renal Disease Maternal Grandmother   . Diabetes Maternal Grandmother   . Hypertension Maternal Aunt   . Hypertension Maternal Uncle     Social History Social History   Tobacco Use  . Smoking status: Current Every Day Smoker    Packs/day: 6.00    Years: 20.00    Pack years: 120.00    Types: Cigarettes  . Smokeless tobacco: Current User  Substance Use Topics  . Alcohol use: Yes    Alcohol/week: 3.0 standard drinks    Types: 3 Cans of beer per week    Comment: occasional  . Drug use: Yes    Types: Marijuana  Comment: remote cocaine abuse, ongoing marijuana use.     Review of Systems  Constitutional: No fever/chills Eyes: No visual changes.  Cardiovascular: no chest pain. Respiratory: no cough. No SOB. Gastrointestinal: No abdominal pain.  No nausea, no vomiting.  No diarrhea.  No constipation. Musculoskeletal: Negative for musculoskeletal pain. Skin: Positive for abscess to the right shoulder Neurological: Negative for headaches, focal weakness or numbness. 10-point ROS otherwise negative.  ____________________________________________   PHYSICAL EXAM:  VITAL SIGNS: ED Triage Vitals  Enc Vitals Group     BP 09/10/18 1912 125/83     Pulse Rate 09/10/18 1912 82     Resp 09/10/18 1912 16     Temp 09/10/18 1912 98.2 F (36.8 C)     Temp Source 09/10/18 1912 Oral     SpO2 09/10/18 1912 99 %     Weight 09/10/18  1908 209 lb 7 oz (95 kg)     Height 09/10/18 1908 6\' 1"  (1.854 m)     Head Circumference --      Peak Flow --      Pain Score 09/10/18 1908 7     Pain Loc --      Pain Edu? --      Excl. in [place]? --      Constitutional: Alert and oriented. Well appearing and in no acute distress. Eyes: Conjunctivae are normal. PERRL. EOMI. Head: Atraumatic. Neck: No stridor.    Cardiovascular: Normal rate, regular rhythm. Normal S1 and S2.  Good peripheral circulation. Respiratory: Normal respiratory effort without tachypnea or retractions. Lungs CTAB. Good air entry to the bases with no decreased or absent breath sounds. Musculoskeletal: Full range of motion to all extremities. No gross deformities appreciated. Neurologic:  Normal speech and language. No gross focal neurologic deficits are appreciated.  Skin:  Skin is warm, dry and intact. No rash noted.  Visualization of the right scapular region reveals healing wound consistent with incision and drainage.  No packing in place.  Palpation expresses minimal amount of pus.  Erythema and edema appears to be improving from previous visit.  Area measures approximately 6 cm in diameter at this time.  Area is mildly tender to palpation.  Palpation of the area reveals no deep fluctuance. Psychiatric: Mood and affect are normal. Speech and behavior are normal. Patient exhibits appropriate insight and judgement.   ____________________________________________   LABS (all labs ordered are listed, but only abnormal results are displayed)  Labs Reviewed - No data to display ____________________________________________  EKG   ____________________________________________  RADIOLOGY   No results found.  ____________________________________________    PROCEDURES  Procedure(s) performed:    Procedures    Medications  clindamycin (CLEOCIN) capsule 300 mg (300 mg Oral Given 09/10/18 1950)      ____________________________________________   INITIAL IMPRESSION / ASSESSMENT AND PLAN / ED COURSE  Pertinent labs & imaging results that were available during my care of the patient were reviewed by me and considered in my medical decision making (see chart for details).  Review of the [place] CSRS was performed in accordance of the [place] prior to dispensing any controlled drugs.      Patient's diagnosis is consistent with abscess to the right shoulder.  Patient presents the emergency department for wound check.  Patient finished antibiotics previously prescribed.  He was unsure of antibiotic that was prescribed by his nephrologist.  Patient reports that he still has some drainage from the area.  Palpation of the area reveals mild expression of pus.  No  significant fluctuance concerning for worsening abscess.  At this time, area is draining spontaneously.  Does appear that patient requires further antibiotics.  No indication for labs or imaging at this time.  Patient is agreeable to plan.  He will follow-up with primary care if necessary.. Patient will be discharged home with prescriptions for clindamycin. Patient is given ED precautions to return to the ED for any worsening or new symptoms.     ____________________________________________  FINAL CLINICAL IMPRESSION(S) / ED DIAGNOSES  Final diagnoses:  Wound check, abscess      NEW MEDICATIONS STARTED DURING THIS VISIT:  ED Discharge Orders         Ordered    clindamycin (CLEOCIN) 300 MG capsule  4 times daily     09/10/18 1944              This chart was dictated using voice recognition software/Dragon. Despite best efforts to proofread, errors can occur which can change the meaning. Any change was purely unintentional.    Darletta Moll, PA-C 09/10/18 2334    Schuyler Amor, MD 09/16/18 2227

## 2018-10-07 ENCOUNTER — Other Ambulatory Visit: Payer: Self-pay

## 2018-10-07 ENCOUNTER — Emergency Department
Admission: EM | Admit: 2018-10-07 | Discharge: 2018-10-07 | Disposition: A | Discharge status: Home / Self Care | Payer: Medicare Other | Attending: Emergency Medicine | Admitting: Emergency Medicine

## 2018-10-07 ENCOUNTER — Encounter: Payer: Self-pay | Admitting: *Deleted

## 2018-10-07 DIAGNOSIS — F17210 Nicotine dependence, cigarettes, uncomplicated: Secondary | ICD-10-CM | POA: Insufficient documentation

## 2018-10-07 DIAGNOSIS — Y829 Unspecified medical devices associated with adverse incidents: Secondary | ICD-10-CM | POA: Insufficient documentation

## 2018-10-07 DIAGNOSIS — I509 Heart failure, unspecified: Secondary | ICD-10-CM | POA: Diagnosis not present

## 2018-10-07 DIAGNOSIS — Z79899 Other long term (current) drug therapy: Secondary | ICD-10-CM | POA: Insufficient documentation

## 2018-10-07 DIAGNOSIS — T82838A Hemorrhage of vascular prosthetic devices, implants and grafts, initial encounter: Secondary | ICD-10-CM | POA: Diagnosis present

## 2018-10-07 DIAGNOSIS — I132 Hypertensive heart and chronic kidney disease with heart failure and with stage 5 chronic kidney disease, or end stage renal disease: Secondary | ICD-10-CM | POA: Insufficient documentation

## 2018-10-07 DIAGNOSIS — N186 End stage renal disease: Secondary | ICD-10-CM | POA: Insufficient documentation

## 2018-10-07 NOTE — Discharge Instructions (Addendum)
Please seek medical attention for any high fevers, chest pain, shortness of breath, change in behavior, persistent vomiting, bloody stool or any other new or concerning symptoms.  

## 2018-10-07 NOTE — ED Provider Notes (Signed)
[facility] Emergency Department Provider Note  ____________________________________________   I have reviewed the triage vital signs and the nursing notes.   HISTORY  Chief Complaint bleeding from dialysis catheter   History limited by: Not Limited   HPI [name] [name] is a 57 y.o. male who presents to the emergency department today because of concerns for bleeding at the site of the AV dialysis catheter that was placed in his right upper leg earlier today.  Patient states that he had this catheter placed because his AV fistula had not been working.  He states that since getting a place this morning it is continued to bleed.  He states it is been a slow bleed.  He denies any shortness of breath or chest pain.  He does have some pain around the site of the insertion.   Per medical record review patient has a history of anemia of chronic disease.   Past Medical History:  Diagnosis Date  . Anemia of chronic disease    BL Hgb ~10  . CHF (congestive heart failure) ([place])    pt denies 04/01/14  . Diverticulitis   . End-stage renal disease on hemodialysis ([place])    HD m/w/f - right arm graft  . History of blood transfusion   . Hypertension   . Pneumonia    hx of  . Pneumonia   . Polysubstance abuse ([place])   . Renal insufficiency   . Secondary hyperparathyroidism ([place])   . Sleep disturbance   . Tobacco abuse     Patient Active Problem List   Diagnosis Date Noted  . Diverticulitis large intestine 12/05/2017  . Diverticulitis of colon 04/24/2015  . Community acquired pneumonia 04/18/2015  . Sepsis ([place]) 04/16/2015  . Shortness of breath 07/07/2013  . Acidosis, metabolic, with respiratory acidosis 04/23/2013  . Acute post-thoracotomy pain 04/23/2013  . Hemorrhagic shock ([place]) 04/23/2013  . S/P sympathectomy 04/23/2013  . 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 Persistent air leak 03/18/2013  . Pneumothorax, right 03/18/2013  . S/P  thoracotomy 03/04/2013  . 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 ([place]) 01/17/2013  . Bacteremia 01/06/2013  . Benign neoplasm of colon 12/16/2012  . Diverticulosis of colon (without mention of hemorrhage) 12/16/2012  . ESCRF (end stage chronic renal failure) ([place]) 12/12/2012  . H/O: hypertension 12/12/2012  . Pleural effusion, right 12/12/2012  . History of prolonged Q-T interval on ECG 12/12/2012  . Hyperkalemia 12/11/2012  . Acute blood loss anemia 12/11/2012  . Sinus tachycardia, resolved with PRBC resuscitation 12/11/2012  . Lower GI bleed 12/11/2012  . End-stage renal disease on hemodialysis ([place])   . Secondary hyperparathyroidism, renal ([place])   . Hypotension   . Polysubstance abuse ([place])   . Anemia of chronic disease   . Tobacco abuse     Past Surgical History:  Procedure Laterality Date  . APPENDECTOMY    . CHEST TUBE INSERTION  02/2013  . COLONOSCOPY  12/16/2012   Procedure: COLONOSCOPY;  Surgeon: Milus Banister, MD;  Location: [place];  Service: Endoscopy;  Laterality: N/A;  . PLEURAL EFFUSION DRAINAGE  12/18/2012   Procedure: DRAINAGE OF PLEURAL EFFUSION;  Surgeon: Melrose Nakayama, MD;  Location: [place];  Service: Thoracic;  Laterality: Right;  . surgery for horseshoe kidney    . TALC PLEURODESIS Right 02/05/2013   Procedure: Pietro Cassis;  Surgeon: Melrose Nakayama, MD;  Location: [place];  Service: Thoracic;  Laterality: Right;  . TEE WITHOUT CARDIOVERSION N/A  01/16/2013   Procedure: TRANSESOPHAGEAL ECHOCARDIOGRAM (TEE);  Surgeon: Sanda Klein, MD;  Location[facility] ENDOSCOPY;  Service: Cardiovascular;  Laterality: N/A;  . VIDEO ASSISTED THORACOSCOPY  12/18/2012   Procedure: VIDEO ASSISTED THORACOSCOPY;  Surgeon: Melrose Nakayama, MD;  Location: [place];  Service: Thoracic;  Laterality: Right;  pleural peel  . VIDEO ASSISTED THORACOSCOPY (VATS)/THOROCOTOMY Right 02/19/2013   Procedure: VIDEO ASSISTED THORACOSCOPY (VATS)/THOROCOTOMY;  Surgeon:  Melrose Nakayama, MD;  Location: [place];  Service: Thoracic;  Laterality: Right;  Right Video Assisted Thoracoscopy, Right Thoracotomy, Decortication,   . VIDEO BRONCHOSCOPY WITH INSERTION OF INTERBRONCHIAL VALVE (IBV)  01/09/2013   Procedure: VIDEO BRONCHOSCOPY WITH INSERTION OF INTERBRONCHIAL VALVE (IBV);  Surgeon: Melrose Nakayama, MD;  Location: [place];  Service: Thoracic;  Laterality: N/A;  placement of 2 Intrabronchial valves in right lower lobe  . VIDEO BRONCHOSCOPY WITH INSERTION OF INTERBRONCHIAL VALVE (IBV) Right 02/19/2013   Procedure: VIDEO BRONCHOSCOPY WITH INSERTION OF INTERBRONCHIAL VALVE (IBV);  Surgeon: Melrose Nakayama, MD;  Location: [place];  Service: Thoracic;  Laterality: Right;  removal of two interbronchial valves right chest    Prior to Admission medications   Medication Sig Start Date End Date Taking? Authorizing Provider  acetaminophen (TYLENOL) 500 MG tablet Take 1,000 mg by mouth every 8 (eight) hours as needed for moderate pain.    [provider]  amoxicillin-clavulanate (AUGMENTIN) 500-125 MG tablet Take 1 tablet (500 mg total) by mouth daily. 12/26/17   Clayburn Pert, MD  Bismuth Subsalicylate (KAOPECTATE PO) Take 15 mLs by mouth daily as needed (for nausea).    [provider]  calcium acetate (PHOSLO) 667 MG capsule Take 2,668 mg by mouth 3 (three) times daily with meals.     [provider]  cinacalcet (SENSIPAR) 90 MG tablet Take 90 mg by mouth daily.    [provider]  clindamycin (CLEOCIN) 300 MG capsule Take 1 capsule (300 mg total) by mouth 4 (four) times daily. 09/10/18   Cuthriell, Charline Bills, PA-C  metoprolol succinate (TOPROL-XL) 50 MG 24 hr tablet Take 50 mg by mouth daily. Take with or immediately following a meal.    [provider]  ondansetron (ZOFRAN) 4 MG tablet Take 4 mg by mouth every 8 (eight) hours as needed. 04/21/15   [provider]  oxyCODONE (ROXICODONE) 5 MG immediate release  tablet Take 1 tablet (5 mg total) by mouth every 6 (six) hours as needed for moderate pain. 09/05/18 09/05/19  Johnn Hai, PA-C  zolpidem (AMBIEN) 5 MG tablet Take by mouth.    [provider]    Allergies Other and Tape  Family History  Problem Relation Age of Onset  . Thyroid disease Mother   . Renal Disease Maternal Grandmother   . Diabetes Maternal Grandmother   . Hypertension Maternal Aunt   . Hypertension Maternal Uncle     Social History Social History   Tobacco Use  . Smoking status: Current Every Day Smoker    Packs/day: 6.00    Years: 20.00    Pack years: 120.00    Types: Cigarettes  . Smokeless tobacco: Current User  Substance Use Topics  . Alcohol use: Yes    Alcohol/week: 3.0 standard drinks    Types: 3 Cans of beer per week    Comment: occasional  . Drug use: Yes    Types: Marijuana    Comment: remote cocaine abuse, ongoing marijuana use.    Review of Systems Constitutional: No fever/chills Eyes: No visual  changes. ENT: No sore throat. Cardiovascular: Denies chest pain. Respiratory: Denies shortness of breath. Gastrointestinal: No abdominal pain.  No nausea, no vomiting.  No diarrhea.   Genitourinary: Negative for dysuria. Musculoskeletal: Positive for right leg pain at site of catheter insertion. Skin: Negative for rash. Neurological: Negative for headaches, focal weakness or numbness.  ____________________________________________   PHYSICAL EXAM:  VITAL SIGNS: ED Triage Vitals  Enc Vitals Group     BP 10/07/18 2111 136/77     Pulse Rate 10/07/18 2111 66     Resp 10/07/18 2111 18     Temp 10/07/18 2111 98.1 F (36.7 C)     Temp Source 10/07/18 2111 Oral     SpO2 10/07/18 2111 99 %     Weight 10/07/18 2112 209 lb (94.8 kg)     Height 10/07/18 2112 6\' 1"  (1.854 m)     Head Circumference --      Peak Flow --      Pain Score 10/07/18 2112 9   Constitutional: Alert and oriented.  Eyes: Conjunctivae are normal.  ENT       Head: Normocephalic and atraumatic.      Nose: No congestion/rhinnorhea.      Mouth/Throat: Mucous membranes are moist.      Neck: No stridor. Hematological/Lymphatic/Immunilogical: No cervical lymphadenopathy. Cardiovascular: Normal rate, regular rhythm.  No murmurs, rubs, or gallops.  Respiratory: Normal respiratory effort without tachypnea nor retractions. Breath sounds are clear and equal bilaterally. No wheezes/rales/rhonchi. Gastrointestinal: Soft and non tender. No rebound. No guarding.  Genitourinary: Deferred Musculoskeletal: Dialysis catheter placement in right upper thigh. Slow amount of active bleeding noted Neurologic:  Normal speech and language. No gross focal neurologic deficits are appreciated.  Skin:  Skin is warm, dry and intact. No rash noted. Psychiatric: Mood and affect are normal. Speech and behavior are normal. Patient exhibits appropriate insight and judgment.  ____________________________________________    LABS (pertinent positives/negatives)  None  ____________________________________________   EKG  None  ____________________________________________    RADIOLOGY  None  ____________________________________________   PROCEDURES  Procedures  ____________________________________________   INITIAL IMPRESSION / ASSESSMENT AND PLAN / ED COURSE  Pertinent labs & imaging results that were available during my care of the patient were reviewed by me and considered in my medical decision making (see chart for details).   Patient presents to the emergency department because of concern for bleeding from site of recent dialysis catheter placement. On exam patient had slow active bleeding from the site. Surgicel and petroleum coated dressing applied. Patient was watched for 30 minutes without any further bleeding noted. Discussed with patient importance of follow up with doctors who placed the catheter.    ____________________________________________   FINAL CLINICAL IMPRESSION(S) / ED DIAGNOSES  Final diagnoses:  Bleeding due to dialysis catheter placement, initial encounter [facility])     Note: This dictation was prepared with Dragon dictation. Any transcriptional errors that result from this process are unintentional     Nance Pear, MD 10/08/18 1520

## 2018-10-07 NOTE — ED Triage Notes (Signed)
Pt to triage via wheelchair.  Pt has bleeding from dialysis catheter that was placed this morning in right leg.  Pt alert.

## 2018-10-07 NOTE — ED Notes (Signed)
Pt states bleeding that has continued all day to his right upper leg. Pt has bandages in place at this time. Pt is alert and appears in NAD at this time.

## 2019-01-30 ENCOUNTER — Emergency Department: Payer: Medicare Other

## 2019-01-30 ENCOUNTER — Encounter: Payer: Self-pay | Admitting: Emergency Medicine

## 2019-01-30 ENCOUNTER — Other Ambulatory Visit: Payer: Self-pay

## 2019-01-30 ENCOUNTER — Emergency Department
Admission: EM | Admit: 2019-01-30 | Discharge: 2019-01-30 | Disposition: A | Discharge status: Home / Self Care | Payer: Medicare Other | Attending: Emergency Medicine | Admitting: Emergency Medicine

## 2019-01-30 DIAGNOSIS — K529 Noninfective gastroenteritis and colitis, unspecified: Secondary | ICD-10-CM

## 2019-01-30 DIAGNOSIS — R109 Unspecified abdominal pain: Secondary | ICD-10-CM | POA: Diagnosis present

## 2019-01-30 DIAGNOSIS — I132 Hypertensive heart and chronic kidney disease with heart failure and with stage 5 chronic kidney disease, or end stage renal disease: Secondary | ICD-10-CM | POA: Diagnosis not present

## 2019-01-30 DIAGNOSIS — Z79899 Other long term (current) drug therapy: Secondary | ICD-10-CM | POA: Diagnosis not present

## 2019-01-30 DIAGNOSIS — F17210 Nicotine dependence, cigarettes, uncomplicated: Secondary | ICD-10-CM | POA: Diagnosis not present

## 2019-01-30 DIAGNOSIS — N186 End stage renal disease: Secondary | ICD-10-CM | POA: Diagnosis not present

## 2019-01-30 DIAGNOSIS — I509 Heart failure, unspecified: Secondary | ICD-10-CM | POA: Insufficient documentation

## 2019-01-30 LAB — COMPREHENSIVE METABOLIC PANEL
ALBUMIN: 4.3 g/dL (ref 3.5–5.0)
ALT: 11 U/L (ref 0–44)
AST: 18 U/L (ref 15–41)
Alkaline Phosphatase: 49 U/L (ref 38–126)
Anion gap: 16 — ABNORMAL HIGH (ref 5–15)
BUN: 53 mg/dL — AB (ref 6–20)
CO2: 26 mmol/L (ref 22–32)
CREATININE: 11.74 mg/dL — AB (ref 0.61–1.24)
Calcium: 8.8 mg/dL — ABNORMAL LOW (ref 8.9–10.3)
Chloride: 98 mmol/L (ref 98–111)
GFR calc Af Amer: 5 mL/min — ABNORMAL LOW (ref >60)
GFR calc non Af Amer: 4 mL/min — ABNORMAL LOW (ref >60)
Glucose, Bld: 107 mg/dL — ABNORMAL HIGH (ref 70–99)
Potassium: 4.8 mmol/L (ref 3.5–5.1)
SODIUM: 140 mmol/L (ref 135–145)
Total Bilirubin: 0.7 mg/dL (ref 0.3–1.2)
Total Protein: 7.9 g/dL (ref 6.5–8.1)

## 2019-01-30 LAB — CBC
HCT: 41.8 % (ref 39.0–52.0)
Hemoglobin: 12.7 g/dL — ABNORMAL LOW (ref 13.0–17.0)
MCH: 30 pg (ref 26.0–34.0)
MCHC: 30.4 g/dL (ref 30.0–36.0)
MCV: 98.6 fL (ref 80.0–100.0)
NRBC: 0 % (ref 0.0–0.2)
Platelets: 142 10*3/uL — ABNORMAL LOW (ref 150–400)
RBC: 4.24 MIL/uL (ref 4.22–5.81)
RDW: 13.3 % (ref 11.5–15.5)
WBC: 5.1 10*3/uL (ref 4.0–10.5)

## 2019-01-30 LAB — LIPASE, BLOOD: Lipase: 71 U/L — ABNORMAL HIGH (ref 11–51)

## 2019-01-30 MED ORDER — ONDANSETRON HCL 4 MG/2ML IJ SOLN
4.0000 mg | Freq: Once | INTRAMUSCULAR | Status: AC
  Administered 2019-01-30: 4 mg via INTRAVENOUS

## 2019-01-30 MED ORDER — ONDANSETRON 4 MG PO TBDP: 4.0000 mg | ORAL_TABLET | Freq: Three times a day (TID) | ORAL | 0 refills | Status: DC | PRN

## 2019-01-30 MED ORDER — SODIUM CHLORIDE 0.9 % IV BOLUS
500.0000 mL | Freq: Once | INTRAVENOUS | Status: AC
  Administered 2019-01-30: 500 mL via INTRAVENOUS

## 2019-01-30 MED ORDER — ONDANSETRON HCL 4 MG/2ML IJ SOLN
4.0000 mg | Freq: Once | INTRAMUSCULAR | Status: AC
  Administered 2019-01-30: 4 mg via INTRAVENOUS
  Filled 2019-01-30: qty 2

## 2019-01-30 MED ORDER — IOHEXOL 300 MG/ML  SOLN
100.0000 mL | Freq: Once | INTRAMUSCULAR | Status: AC | PRN
  Administered 2019-01-30: 100 mL via INTRAVENOUS
  Filled 2019-01-30: qty 100

## 2019-01-30 MED ORDER — MORPHINE SULFATE (PF) 4 MG/ML IV SOLN
INTRAVENOUS | Status: AC
  Administered 2019-01-30: 4 mg via INTRAVENOUS
  Filled 2019-01-30: qty 1

## 2019-01-30 MED ORDER — MORPHINE SULFATE (PF) 4 MG/ML IV SOLN
4.0000 mg | Freq: Once | INTRAVENOUS | Status: AC
  Administered 2019-01-30: 4 mg via INTRAVENOUS

## 2019-01-30 MED ORDER — ONDANSETRON HCL 4 MG/2ML IJ SOLN
INTRAMUSCULAR | Status: AC
  Administered 2019-01-30: 4 mg via INTRAVENOUS
  Filled 2019-01-30: qty 2

## 2019-01-30 NOTE — ED Triage Notes (Signed)
First Nurse Note: Arrives with son with C/O vomiting and abdominal pain x 2 days.  Son reports that patient has been unable to tolerate PO x 2 days.

## 2019-01-30 NOTE — ED Notes (Signed)
Pt c/o sudden onset mid abdominal pain x 3 days. Pt also c/o vomiting and diarrhea. Pt is a MWF dialysis, began feeling bad after dialysis on Wednesday. Pt reports today began feeling better, after eating he began to have N/V again. Pt is alert and oriented, able to answer questions, however in laying in fetal position on his L side repeatedly groaning while this RN obtaining IV access. Pt with noted vomiting from triage.

## 2019-01-30 NOTE — ED Notes (Signed)
Topez not working 

## 2019-01-30 NOTE — ED Triage Notes (Signed)
Pt to ER with c/o abdominal pain, vomiting and diarrhea since Wednesday.  States started shortly after eating at [facility].  Pt with active vomiting in triage.

## 2019-01-30 NOTE — ED Provider Notes (Signed)
[facility] Emergency Department Provider Note   ____________________________________________    I have reviewed the triage vital signs and the nursing notes.   HISTORY  Chief Complaint Abdominal Pain; Emesis; and Diarrhea     HPI [name] [name] is a 58 y.o. male with a history of end-stage renal disease, CHF, hypertension and polysubstance abuse who presents today with complaints of abdominal pain nausea vomiting and diarrhea.  Patient reports symptoms started several days ago, seem to have gotten better today until he ate something and his vomiting started again.  Denies fevers or chills.  No sick contacts.  No recent travel.  Does not take anything for this.  Abdominal pain is diffuse cramping  Past Medical History:  Diagnosis Date  . Anemia of chronic disease    BL Hgb ~10  . CHF (congestive heart failure) ([place])    pt denies 04/01/14  . Diverticulitis   . End-stage renal disease on hemodialysis ([place])    HD m/w/f - right arm graft  . History of blood transfusion   . Hypertension   . Pneumonia    hx of  . Pneumonia   . Polysubstance abuse ([place])   . Renal insufficiency   . Secondary hyperparathyroidism ([place])   . Sleep disturbance   . Tobacco abuse     Patient Active Problem List   Diagnosis Date Noted  . Diverticulitis large intestine 12/05/2017  . Diverticulitis of colon 04/24/2015  . Community acquired pneumonia 04/18/2015  . Sepsis ([place]) 04/16/2015  . Shortness of breath 07/07/2013  . Acidosis, metabolic, with respiratory acidosis 04/23/2013  . Acute post-thoracotomy pain 04/23/2013  . Hemorrhagic shock ([place]) 04/23/2013  . S/P sympathectomy 04/23/2013  . 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 Persistent air leak 03/18/2013  . Pneumothorax, right 03/18/2013  . S/P thoracotomy 03/04/2013  . 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 ([place]) 01/17/2013  . Bacteremia 01/06/2013  . Benign neoplasm of colon  12/16/2012  . Diverticulosis of colon (without mention of hemorrhage) 12/16/2012  . ESCRF (end stage chronic renal failure) ([place]) 12/12/2012  . H/O: hypertension 12/12/2012  . Pleural effusion, right 12/12/2012  . History of prolonged Q-T interval on ECG 12/12/2012  . Hyperkalemia 12/11/2012  . Acute blood loss anemia 12/11/2012  . Sinus tachycardia, resolved with PRBC resuscitation 12/11/2012  . Lower GI bleed 12/11/2012  . End-stage renal disease on hemodialysis ([place])   . Secondary hyperparathyroidism, renal ([place])   . Hypotension   . Polysubstance abuse ([place])   . Anemia of chronic disease   . Tobacco abuse     Past Surgical History:  Procedure Laterality Date  . APPENDECTOMY    . CHEST TUBE INSERTION  02/2013  . COLONOSCOPY  12/16/2012   Procedure: COLONOSCOPY;  Surgeon: Milus Banister, MD;  Location: [place];  Service: Endoscopy;  Laterality: N/A;  . PLEURAL EFFUSION DRAINAGE  12/18/2012   Procedure: DRAINAGE OF PLEURAL EFFUSION;  Surgeon: Melrose Nakayama, MD;  Location: [place];  Service: Thoracic;  Laterality: Right;  . surgery for horseshoe kidney    . TALC PLEURODESIS Right 02/05/2013   Procedure: Pietro Cassis;  Surgeon: Melrose Nakayama, MD;  Location: [place];  Service: Thoracic;  Laterality: Right;  . TEE WITHOUT CARDIOVERSION N/A 01/16/2013   Procedure: TRANSESOPHAGEAL ECHOCARDIOGRAM (TEE);  Surgeon: Sanda Klein, MD;  Location: [place];  Service: Cardiovascular;  Laterality: N/A;  . VIDEO ASSISTED THORACOSCOPY  12/18/2012   Procedure: VIDEO ASSISTED THORACOSCOPY;  Surgeon: Melrose Nakayama, MD;  Location: [place];  Service: Thoracic;  Laterality: Right;  pleural peel  . VIDEO ASSISTED THORACOSCOPY (VATS)/THOROCOTOMY Right 02/19/2013   Procedure: VIDEO ASSISTED THORACOSCOPY (VATS)/THOROCOTOMY;  Surgeon: Melrose Nakayama, MD;  Location: [place];  Service: Thoracic;  Laterality: Right;  Right Video Assisted Thoracoscopy, Right Thoracotomy, Decortication,   .  VIDEO BRONCHOSCOPY WITH INSERTION OF INTERBRONCHIAL VALVE (IBV)  01/09/2013   Procedure: VIDEO BRONCHOSCOPY WITH INSERTION OF INTERBRONCHIAL VALVE (IBV);  Surgeon: Melrose Nakayama, MD;  Location: [place];  Service: Thoracic;  Laterality: N/A;  placement of 2 Intrabronchial valves in right lower lobe  . VIDEO BRONCHOSCOPY WITH INSERTION OF INTERBRONCHIAL VALVE (IBV) Right 02/19/2013   Procedure: VIDEO BRONCHOSCOPY WITH INSERTION OF INTERBRONCHIAL VALVE (IBV);  Surgeon: Melrose Nakayama, MD;  Location: [place];  Service: Thoracic;  Laterality: Right;  removal of two interbronchial valves right chest    Prior to Admission medications   Medication Sig Start Date End Date Taking? Authorizing Provider  acetaminophen (TYLENOL) 500 MG tablet Take 1,000 mg by mouth every 8 (eight) hours as needed for moderate pain.    [provider]  calcium acetate (PHOSLO) 667 MG capsule Take 2,668 mg by mouth 3 (three) times daily with meals.     [provider]  cinacalcet (SENSIPAR) 90 MG tablet Take 90 mg by mouth daily.    [provider]  metoprolol succinate (TOPROL-XL) 50 MG 24 hr tablet Take 50 mg by mouth daily. Take with or immediately following a meal.    [provider]  ondansetron (ZOFRAN ODT) 4 MG disintegrating tablet Take 1 tablet (4 mg total) by mouth every 8 (eight) hours as needed for nausea or vomiting. 01/30/19   Lavonia Drafts, MD  zolpidem (AMBIEN) 5 MG tablet Take by mouth.    [provider]     Allergies Other and Tape  Family History  Problem Relation Age of Onset  . Thyroid disease Mother   . Renal Disease Maternal Grandmother   . Diabetes Maternal Grandmother   . Hypertension Maternal Aunt   . Hypertension Maternal Uncle     Social History Social History   Tobacco Use  . Smoking status: Current Every Day Smoker    Packs/day: 6.00    Years: 20.00    Pack years: 120.00    Types: Cigarettes  . Smokeless tobacco: Current User    Substance Use Topics  . Alcohol use: Yes    Alcohol/week: 3.0 standard drinks    Types: 3 Cans of beer per week    Comment: occasional  . Drug use: Yes    Types: Marijuana    Comment: remote cocaine abuse, ongoing marijuana use.    Review of Systems  Constitutional: No fevers Eyes: No visual changes.  ENT: No sore throat. Cardiovascular: Denies chest pain. Respiratory: Denies shortness of breath. Gastrointestinal: As above Genitourinary: Negative for dysuria. Musculoskeletal: No myalgias Skin: Negative for rash. Neurological: Negative for headaches or weakness   ____________________________________________   PHYSICAL EXAM:  VITAL SIGNS: ED Triage Vitals  Enc Vitals Group     BP 01/30/19 1837 (!) 175/89     Pulse Rate 01/30/19 1837 76     Resp 01/30/19 1837 18     Temp 01/30/19 1837 98 F (36.7 C)     Temp src --      SpO2 01/30/19 1837 100 %     Weight 01/30/19 1838 95 kg (209 lb 7 oz)  Height 01/30/19 1838 1.854 m (6\' 1" )     Head Circumference --      Peak Flow --      Pain Score 01/30/19 1838 10     Pain Loc --      Pain Edu? --      Excl. in [place]? --     Constitutional: Alert and oriented.   Nose: No congestion/rhinnorhea. Mouth/Throat: Mucous membranes are moist.    Cardiovascular: Normal rate, regular rhythm. Grossly normal heart sounds.  Good peripheral circulation. Respiratory: Normal respiratory effort.  No retractions. Lungs CTAB. Gastrointestinal: Soft, no significant tenderness, no distention  Musculoskeletal  Warm and well perfused Neurologic:  Normal speech and language. No gross focal neurologic deficits are appreciated.  Skin:  Skin is warm, dry and intact. No rash noted. Psychiatric: Mood and affect are normal. Speech and behavior are normal.  ____________________________________________   LABS (all labs ordered are listed, but only abnormal results are displayed)  Labs Reviewed  CBC - Abnormal; Notable for the following  components:      Result Value   Hemoglobin 12.7 (*)    Platelets 142 (*)    All other components within normal limits  COMPREHENSIVE METABOLIC PANEL - Abnormal; Notable for the following components:   Glucose, Bld 107 (*)    BUN 53 (*)    Creatinine, Ser 11.74 (*)    Calcium 8.8 (*)    GFR calc non Af Amer 4 (*)    GFR calc Af Amer 5 (*)    Anion gap 16 (*)    All other components within normal limits  LIPASE, BLOOD - Abnormal; Notable for the following components:   Lipase 71 (*)    All other components within normal limits   ____________________________________________  EKG  None ____________________________________________  RADIOLOGY CT abdomen pelvis no acute findings  ____________________________________________   PROCEDURES  Procedure(s) performed: No  Procedures   Critical Care performed: No ____________________________________________   INITIAL IMPRESSION / ASSESSMENT AND PLAN / ED COURSE  Pertinent labs & imaging results that were available during my care of the patient were reviewed by me and considered in my medical decision making (see chart for details).  Patient presents with nausea vomiting diarrhea abdominal cramping suspicious for viral gastroenteritis, will check labs give IV fluids, IV morphine, IV Zofran, checked last EKG no evidence of QT prolongation.  Patient CT scan is reassuring.  ----------------------------------------- 9:49 PM on 01/30/2019 -----------------------------------------  After treatment patient is no longer complaining of pain or nausea, he feels significantly better.  CT is reassuring, lab work is overall unremarkable, appropriate for discharge at this time, strict return precautions discussed with the patient he agrees with this plan    ____________________________________________   FINAL CLINICAL IMPRESSION(S) / ED DIAGNOSES  Final diagnoses:  Gastroenteritis        Note:  This document was prepared  using Dragon voice recognition software and may include unintentional dictation errors.   Lavonia Drafts, MD 01/30/19 2149

## 2019-04-16 ENCOUNTER — Encounter: Payer: Self-pay | Admitting: *Deleted

## 2019-04-16 ENCOUNTER — Other Ambulatory Visit: Payer: Self-pay

## 2019-04-16 ENCOUNTER — Inpatient Hospital Stay
Admission: EM | Admit: 2019-04-16 | Discharge: 2019-04-18 | DRG: 377 | Disposition: A | Discharge status: Home / Self Care | Payer: Medicare Other | Attending: Internal Medicine | Admitting: Internal Medicine

## 2019-04-16 DIAGNOSIS — G4733 Obstructive sleep apnea (adult) (pediatric): Secondary | ICD-10-CM | POA: Diagnosis present

## 2019-04-16 DIAGNOSIS — Z7902 Long term (current) use of antithrombotics/antiplatelets: Secondary | ICD-10-CM

## 2019-04-16 DIAGNOSIS — D696 Thrombocytopenia, unspecified: Secondary | ICD-10-CM | POA: Diagnosis present

## 2019-04-16 DIAGNOSIS — K5731 Diverticulosis of large intestine without perforation or abscess with bleeding: Secondary | ICD-10-CM | POA: Diagnosis present

## 2019-04-16 DIAGNOSIS — I132 Hypertensive heart and chronic kidney disease with heart failure and with stage 5 chronic kidney disease, or end stage renal disease: Secondary | ICD-10-CM | POA: Diagnosis present

## 2019-04-16 DIAGNOSIS — Z1159 Encounter for screening for other viral diseases: Secondary | ICD-10-CM | POA: Diagnosis not present

## 2019-04-16 DIAGNOSIS — Z8249 Family history of ischemic heart disease and other diseases of the circulatory system: Secondary | ICD-10-CM

## 2019-04-16 DIAGNOSIS — N2581 Secondary hyperparathyroidism of renal origin: Secondary | ICD-10-CM | POA: Diagnosis present

## 2019-04-16 DIAGNOSIS — Z833 Family history of diabetes mellitus: Secondary | ICD-10-CM

## 2019-04-16 DIAGNOSIS — Z91048 Other nonmedicinal substance allergy status: Secondary | ICD-10-CM | POA: Diagnosis not present

## 2019-04-16 DIAGNOSIS — K625 Hemorrhage of anus and rectum: Secondary | ICD-10-CM | POA: Diagnosis present

## 2019-04-16 DIAGNOSIS — Z888 Allergy status to other drugs, medicaments and biological substances status: Secondary | ICD-10-CM

## 2019-04-16 DIAGNOSIS — D62 Acute posthemorrhagic anemia: Secondary | ICD-10-CM | POA: Diagnosis present

## 2019-04-16 DIAGNOSIS — D631 Anemia in chronic kidney disease: Secondary | ICD-10-CM | POA: Diagnosis present

## 2019-04-16 DIAGNOSIS — I509 Heart failure, unspecified: Secondary | ICD-10-CM | POA: Diagnosis present

## 2019-04-16 DIAGNOSIS — F17210 Nicotine dependence, cigarettes, uncomplicated: Secondary | ICD-10-CM | POA: Diagnosis present

## 2019-04-16 DIAGNOSIS — Z8349 Family history of other endocrine, nutritional and metabolic diseases: Secondary | ICD-10-CM

## 2019-04-16 DIAGNOSIS — Z79899 Other long term (current) drug therapy: Secondary | ICD-10-CM

## 2019-04-16 DIAGNOSIS — Z992 Dependence on renal dialysis: Secondary | ICD-10-CM | POA: Diagnosis not present

## 2019-04-16 DIAGNOSIS — N186 End stage renal disease: Secondary | ICD-10-CM | POA: Diagnosis present

## 2019-04-16 DIAGNOSIS — Z7982 Long term (current) use of aspirin: Secondary | ICD-10-CM | POA: Diagnosis not present

## 2019-04-16 LAB — TYPE AND SCREEN
ABO/RH(D): A POS
Antibody Screen: NEGATIVE

## 2019-04-16 LAB — CBC
HCT: 33.1 % — ABNORMAL LOW (ref 39.0–52.0)
Hemoglobin: 10 g/dL — ABNORMAL LOW (ref 13.0–17.0)
MCH: 29.9 pg (ref 26.0–34.0)
MCHC: 30.2 g/dL (ref 30.0–36.0)
MCV: 99.1 fL (ref 80.0–100.0)
Platelets: 112 10*3/uL — ABNORMAL LOW (ref 150–400)
RBC: 3.34 MIL/uL — ABNORMAL LOW (ref 4.22–5.81)
RDW: 14.4 % (ref 11.5–15.5)
WBC: 4.2 10*3/uL (ref 4.0–10.5)
nRBC: 0 % (ref 0.0–0.2)

## 2019-04-16 LAB — COMPREHENSIVE METABOLIC PANEL
ALT: 9 U/L (ref 0–44)
AST: 14 U/L — ABNORMAL LOW (ref 15–41)
Albumin: 3.7 g/dL (ref 3.5–5.0)
Alkaline Phosphatase: 49 U/L (ref 38–126)
Anion gap: 15 (ref 5–15)
BUN: 66 mg/dL — ABNORMAL HIGH (ref 6–20)
CO2: 28 mmol/L (ref 22–32)
Calcium: 8.1 mg/dL — ABNORMAL LOW (ref 8.9–10.3)
Chloride: 99 mmol/L (ref 98–111)
Creatinine, Ser: 13.88 mg/dL — ABNORMAL HIGH (ref 0.61–1.24)
GFR calc Af Amer: 4 mL/min — ABNORMAL LOW (ref >60)
GFR calc non Af Amer: 4 mL/min — ABNORMAL LOW (ref >60)
Glucose, Bld: 104 mg/dL — ABNORMAL HIGH (ref 70–99)
Potassium: 4.6 mmol/L (ref 3.5–5.1)
Sodium: 142 mmol/L (ref 135–145)
Total Bilirubin: 0.4 mg/dL (ref 0.3–1.2)
Total Protein: 7.1 g/dL (ref 6.5–8.1)

## 2019-04-16 LAB — PROTIME-INR
INR: 1.1 (ref 0.8–1.2)
Prothrombin Time: 13.8 seconds (ref 11.4–15.2)

## 2019-04-16 MED ORDER — AMLODIPINE BESYLATE 10 MG PO TABS
10.0000 mg | ORAL_TABLET | Freq: Every day | ORAL | Status: DC
  Administered 2019-04-17 – 2019-04-18 (×2): 10 mg via ORAL
  Filled 2019-04-16 (×2): qty 1

## 2019-04-16 MED ORDER — TRAZODONE HCL 50 MG PO TABS: 25.0000 mg | ORAL_TABLET | Freq: Every evening | ORAL | Status: DC | PRN

## 2019-04-16 MED ORDER — FENTANYL CITRATE (PF) 100 MCG/2ML IJ SOLN
50.0000 ug | Freq: Once | INTRAMUSCULAR | Status: DC
Start: 2019-04-16 — End: 2019-04-16

## 2019-04-16 MED ORDER — ACETAMINOPHEN 325 MG PO TABS: 650.0000 mg | ORAL_TABLET | Freq: Four times a day (QID) | ORAL | Status: DC | PRN

## 2019-04-16 MED ORDER — CINACALCET HCL 30 MG PO TABS
90.0000 mg | ORAL_TABLET | Freq: Every day | ORAL | Status: DC
  Filled 2019-04-16: qty 3

## 2019-04-16 MED ORDER — ACETAMINOPHEN 650 MG RE SUPP: 650.0000 mg | Freq: Four times a day (QID) | RECTAL | Status: DC | PRN

## 2019-04-16 MED ORDER — METOPROLOL SUCCINATE ER 50 MG PO TB24
50.0000 mg | ORAL_TABLET | Freq: Every day | ORAL | Status: DC
  Administered 2019-04-17 – 2019-04-18 (×2): 50 mg via ORAL
  Filled 2019-04-16 (×2): qty 1

## 2019-04-16 MED ORDER — CALCIUM ACETATE (PHOS BINDER) 667 MG PO CAPS
2668.0000 mg | ORAL_CAPSULE | Freq: Three times a day (TID) | ORAL | Status: DC
  Filled 2019-04-16 (×5): qty 4

## 2019-04-16 MED ORDER — SODIUM CHLORIDE 0.9 % IV SOLN: INTRAVENOUS | Status: DC

## 2019-04-16 NOTE — ED Notes (Signed)
.. ED TO INPATIENT HANDOFF REPORT  ED Nurse Name and Phone #: Deneise Lever [phone]  P Name/Age/Gender [name] [name] 58 y.o. male Room/Bed: ED07A/ED07A  Code Status   Code Status: Full Code  Home/SNF/Other Home Patient oriented to: self, place, time and situation Is this baseline? Yes   Triage Complete: Triage complete  Chief Complaint Passing blood  Triage Note Pt has rectal bleeding for 4 hours.  Pt also reports abd pain.  Pt reports diarrhea.and bleeding.  No vomiting.  Dialysis yesterday.  Pt is on a blood thinner Pt alert speech clear.   Allergies Allergies  Allergen Reactions  . Other     Other reaction(s): Other (See Comments) blister  . Tape Other (See Comments)    Plastic Tape Causes blister    Level of Care/Admitting Diagnosis ED Disposition    ED Disposition Condition [facility] Area: [place] [[id number]]  Level of Care: Med-Surg [16]  Covid Evaluation: N/A  Diagnosis: Rectal bleeding [[id number]]  Admitting Physician: Christel Mormon [[id number]]  Attending Physician: Christel Mormon [[id number]]  Estimated length of stay: past midnight tomorrow  Certification:: I certify this patient will need inpatient services for at least 2 midnights  PT Class (Do Not Modify): Inpatient [101]  PT Acc Code (Do Not Modify): Private [1]       B Medical/Surgery History Past Medical History:  Diagnosis Date  . Anemia of chronic disease    BL Hgb ~10  . CHF (congestive heart failure) ([place])    pt denies 04/01/14  . Diverticulitis   . End-stage renal disease on hemodialysis ([place])    HD m/w/f - right arm graft  . History of blood transfusion   . Hypertension   . Pneumonia    hx of  . Pneumonia   . Polysubstance abuse ([place])   . Renal insufficiency   . Secondary hyperparathyroidism ([place])   . Sleep disturbance   . Tobacco abuse    Past Surgical History:  Procedure Laterality Date  . APPENDECTOMY    . CHEST TUBE INSERTION  02/2013  .  COLONOSCOPY  12/16/2012   Procedure: COLONOSCOPY;  Surgeon: Milus Banister, MD;  Location: [place];  Service: Endoscopy;  Laterality: N/A;  . PLEURAL EFFUSION DRAINAGE  12/18/2012   Procedure: DRAINAGE OF PLEURAL EFFUSION;  Surgeon: Melrose Nakayama, MD;  Location: [place];  Service: Thoracic;  Laterality: Right;  . surgery for horseshoe kidney    . TALC PLEURODESIS Right 02/05/2013   Procedure: Pietro Cassis;  Surgeon: Melrose Nakayama, MD;  Location: [place];  Service: Thoracic;  Laterality: Right;  . TEE WITHOUT CARDIOVERSION N/A 01/16/2013   Procedure: TRANSESOPHAGEAL ECHOCARDIOGRAM (TEE);  Surgeon: Sanda Klein, MD;  Location: [facility] ENDOSCOPY;  Service: Cardiovascular;  Laterality: N/A;  . VIDEO ASSISTED THORACOSCOPY  12/18/2012   Procedure: VIDEO ASSISTED THORACOSCOPY;  Surgeon: Melrose Nakayama, MD;  Location: [place];  Service: Thoracic;  Laterality: Right;  pleural peel  . VIDEO ASSISTED THORACOSCOPY (VATS)/THOROCOTOMY Right 02/19/2013   Procedure: VIDEO ASSISTED THORACOSCOPY (VATS)/THOROCOTOMY;  Surgeon: Melrose Nakayama, MD;  Location: [place];  Service: Thoracic;  Laterality: Right;  Right Video Assisted Thoracoscopy, Right Thoracotomy, Decortication,   . VIDEO BRONCHOSCOPY WITH INSERTION OF INTERBRONCHIAL VALVE (IBV)  01/09/2013   Procedure: VIDEO BRONCHOSCOPY WITH INSERTION OF INTERBRONCHIAL VALVE (IBV);  Surgeon: Melrose Nakayama, MD;  Location: [place];  Service: Thoracic;  Laterality: N/A;  placement of 2 Intrabronchial valves in right lower lobe  .  VIDEO BRONCHOSCOPY WITH INSERTION OF INTERBRONCHIAL VALVE (IBV) Right 02/19/2013   Procedure: VIDEO BRONCHOSCOPY WITH INSERTION OF INTERBRONCHIAL VALVE (IBV);  Surgeon: Melrose Nakayama, MD;  Location: [facility] OR;  Service: Thoracic;  Laterality: Right;  removal of two interbronchial valves right chest     A IV Location/Drains/Wounds Patient Lines/Drains/Airways Status   Active Line/Drains/Airways    Name:   Placement date:    Placement time:   Site:   Days:   Peripheral IV 04/16/19 Left Antecubital   04/16/19    2251    Antecubital   less than 1   Vascular Access Right Upper arm Arteriovenous fistula   -    -    Upper arm             Intake/Output Last 24 hours No intake or output data in the 24 hours ending 04/16/19 2356  Labs/Imaging Results for orders placed or performed during the hospital encounter of 04/16/19 (from the past 48 hour(s))  Comprehensive metabolic panel     Status: Abnormal   Collection Time: 04/16/19 10:43 PM  Result Value Ref Range   Sodium 142 135 - 145 mmol/L   Potassium 4.6 3.5 - 5.1 mmol/L   Chloride 99 98 - 111 mmol/L   CO2 28 22 - 32 mmol/L   Glucose, Bld 104 (H) 70 - 99 mg/dL   BUN 66 (H) 6 - 20 mg/dL   Creatinine, Ser 13.88 (H) 0.61 - 1.24 mg/dL   Calcium 8.1 (L) 8.9 - 10.3 mg/dL   Total Protein 7.1 6.5 - 8.1 g/dL   Albumin 3.7 3.5 - 5.0 g/dL   AST 14 (L) 15 - 41 U/L   ALT 9 0 - 44 U/L   Alkaline Phosphatase 49 38 - 126 U/L   Total Bilirubin 0.4 0.3 - 1.2 mg/dL   GFR calc non Af Amer 4 (L) >60 mL/min   GFR calc Af Amer 4 (L) >60 mL/min   Anion gap 15 5 - 15    Comment: Performed at [facility], [address] [place], [place] [address]  CBC     Status: Abnormal   Collection Time: 04/16/19 10:43 PM  Result Value Ref Range   WBC 4.2 4.0 - 10.5 K/uL   RBC 3.34 (L) 4.22 - 5.81 MIL/uL   Hemoglobin 10.0 (L) 13.0 - 17.0 g/dL   HCT 33.1 (L) 39.0 - 52.0 %   MCV 99.1 80.0 - 100.0 fL   MCH 29.9 26.0 - 34.0 pg   MCHC 30.2 30.0 - 36.0 g/dL   RDW 14.4 11.5 - 15.5 %   Platelets 112 (L) 150 - 400 K/uL    Comment: Immature Platelet Fraction may be clinically indicated, consider ordering this additional test [id number]    nRBC 0.0 0.0 - 0.2 %    Comment: Performed at [facility], [address] [place], [place] [address]  Type and screen [place]     Status: None   Collection Time: 04/16/19 10:43 PM  Result Value Ref Range    ABO/RH(D) A POS    Antibody Screen NEG    Sample Expiration      04/19/2019,2359 Performed at [facility], [address] [place],  [address]   Protime-INR - (order if Patient is taking Coumadin / Warfarin)     Status: None   Collection Time: 04/16/19 10:43 PM  Result Value Ref Range   Prothrombin Time 13.8 11.4 - 15.2 seconds   INR 1.1  0.8 - 1.2    Comment: (NOTE) INR goal varies based on device and disease states. Performed at [facility], [address] [place], [place] [address]    No results found.  Pending Labs Unresulted Labs (From admission, onward)    Start     Ordered   04/17/19 0998  Basic metabolic panel  Tomorrow morning,   STAT     04/16/19 2353   04/17/19 0500  CBC  Tomorrow morning,   STAT     04/16/19 2353   04/16/19 2354  Hemoglobin and hematocrit, blood  Now then every 6 hours,   STAT     04/16/19 2353   04/16/19 2351  HIV antibody (Routine Testing)  Once,   STAT     04/16/19 2353   04/16/19 2322  SARS Coronavirus 2 (CEPHEID - Performed in [place] hospital lab), Hosp Order  (Asymptomatic Patients Labs)  Once,   STAT    Question:  Rule Out  Answer:  Yes   04/16/19 2322          Vitals/Pain Today's Vitals   04/16/19 2225 04/16/19 2243  BP: (!) 157/80 127/88  Pulse: 84 82  Resp: 20 16  Temp: 98 F (36.7 C)   TempSrc: Oral   SpO2: 96% 98%  Weight: 96 kg   Height: 6\' 1"  (1.854 m)   PainSc: 0-No pain     Isolation Precautions No active isolations  Medications Medications  amLODipine (NORVASC) tablet 10 mg (has no administration in time range)  metoprolol succinate (TOPROL-XL) 24 hr tablet 50 mg (has no administration in time range)  cinacalcet (SENSIPAR) tablet 90 mg (has no administration in time range)  calcium acetate (PHOSLO) capsule 2,668 mg (has no administration in time range)  0.9 %  sodium chloride infusion (has no administration in time range)  acetaminophen (TYLENOL) tablet 650 mg (has no  administration in time range)    Or  acetaminophen (TYLENOL) suppository 650 mg (has no administration in time range)  traZODone (DESYREL) tablet 25 mg (has no administration in time range)    Mobility walks Low fall risk   Focused Assessments GI   R Recommendations: See Admitting Provider Note  Report given to:   Additional Notes:  Pt alert and oriented x4. Right arm restricted

## 2019-04-16 NOTE — ED Provider Notes (Signed)
[facility] Emergency Department Provider Note       Time seen: ----------------------------------------- 11:26 PM on 04/16/2019 -----------------------------------------   I have reviewed the triage vital signs and the nursing notes.  HISTORY   Chief Complaint Rectal Bleeding    HPI [name] [name] is a 58 y.o. male with a history of anemia of chronic disease, CHF, diverticulitis, end-stage renal disease on dialysis, pneumonia, substance abuse who presents to the ED for rectal bleeding.  Patient states he has had 3 episodes of passing bright red blood per rectum over the past 4 hours.  He does not report any abdominal pain, this differs from the triage note.  He has noted some diarrhea recently, denies any vomiting.  He last had dialysis yesterday.  Currently he is taking aspirin and Plavix.  Past Medical History:  Diagnosis Date  . Anemia of chronic disease    BL Hgb ~10  . CHF (congestive heart failure) ([place])    pt denies 04/01/14  . Diverticulitis   . End-stage renal disease on hemodialysis ([place])    HD m/w/f - right arm graft  . History of blood transfusion   . Hypertension   . Pneumonia    hx of  . Pneumonia   . Polysubstance abuse ([place])   . Renal insufficiency   . Secondary hyperparathyroidism ([place])   . Sleep disturbance   . Tobacco abuse     Patient Active Problem List   Diagnosis Date Noted  . Diverticulitis large intestine 12/05/2017  . Diverticulitis of colon 04/24/2015  . Community acquired pneumonia 04/18/2015  . Sepsis ([place]) 04/16/2015  . Shortness of breath 07/07/2013  . Acidosis, metabolic, with respiratory acidosis 04/23/2013  . Acute post-thoracotomy pain 04/23/2013  . Hemorrhagic shock ([place]) 04/23/2013  . S/P sympathectomy 04/23/2013  . 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 Persistent air leak 03/18/2013  . Pneumothorax, right 03/18/2013  . S/P thoracotomy 03/04/2013  . Acute embolism and thrombosis of  internal jugular vein ([place]) 01/17/2013  . Bacteremia 01/06/2013  . Benign neoplasm of colon 12/16/2012  . Diverticulosis of colon (without mention of hemorrhage) 12/16/2012  . ESCRF (end stage chronic renal failure) ([place]) 12/12/2012  . H/O: hypertension 12/12/2012  . Pleural effusion, right 12/12/2012  . History of prolonged Q-T interval on ECG 12/12/2012  . Hyperkalemia 12/11/2012  . Acute blood loss anemia 12/11/2012  . Sinus tachycardia, resolved with PRBC resuscitation 12/11/2012  . Lower GI bleed 12/11/2012  . End-stage renal disease on hemodialysis ([place])   . Secondary hyperparathyroidism, renal ([place])   . Hypotension   . Polysubstance abuse ([place])   . Anemia of chronic disease   . Tobacco abuse     Past Surgical History:  Procedure Laterality Date  . APPENDECTOMY    . CHEST TUBE INSERTION  02/2013  . COLONOSCOPY  12/16/2012   Procedure: COLONOSCOPY;  Surgeon: Milus Banister, MD;  Location: [place];  Service: Endoscopy;  Laterality: N/A;  . PLEURAL EFFUSION DRAINAGE  12/18/2012   Procedure: DRAINAGE OF PLEURAL EFFUSION;  Surgeon: Melrose Nakayama, MD;  Location: [place];  Service: Thoracic;  Laterality: Right;  . surgery for horseshoe kidney    . TALC PLEURODESIS Right 02/05/2013   Procedure: Pietro Cassis;  Surgeon: Melrose Nakayama, MD;  Location: [place];  Service: Thoracic;  Laterality: Right;  . TEE WITHOUT CARDIOVERSION N/A 01/16/2013   Procedure: TRANSESOPHAGEAL ECHOCARDIOGRAM (TEE);  Surgeon: Sanda Klein, MD;  Location: [place];  Service: Cardiovascular;  Laterality:  N/A;  . VIDEO ASSISTED THORACOSCOPY  12/18/2012   Procedure: VIDEO ASSISTED THORACOSCOPY;  Surgeon: Melrose Nakayama, MD;  Location: [place];  Service: Thoracic;  Laterality: Right;  pleural peel  . VIDEO ASSISTED THORACOSCOPY (VATS)/THOROCOTOMY Right 02/19/2013   Procedure: VIDEO ASSISTED THORACOSCOPY (VATS)/THOROCOTOMY;  Surgeon: Melrose Nakayama, MD;  Location: [place];  Service: Thoracic;   Laterality: Right;  Right Video Assisted Thoracoscopy, Right Thoracotomy, Decortication,   . VIDEO BRONCHOSCOPY WITH INSERTION OF INTERBRONCHIAL VALVE (IBV)  01/09/2013   Procedure: VIDEO BRONCHOSCOPY WITH INSERTION OF INTERBRONCHIAL VALVE (IBV);  Surgeon: Melrose Nakayama, MD;  Location: [place];  Service: Thoracic;  Laterality: N/A;  placement of 2 Intrabronchial valves in right lower lobe  . VIDEO BRONCHOSCOPY WITH INSERTION OF INTERBRONCHIAL VALVE (IBV) Right 02/19/2013   Procedure: VIDEO BRONCHOSCOPY WITH INSERTION OF INTERBRONCHIAL VALVE (IBV);  Surgeon: Melrose Nakayama, MD;  Location: [place];  Service: Thoracic;  Laterality: Right;  removal of two interbronchial valves right chest    Allergies Other and Tape  Social History Social History   Tobacco Use  . Smoking status: Current Every Day Smoker    Packs/day: 6.00    Years: 20.00    Pack years: 120.00    Types: Cigarettes  . Smokeless tobacco: Current User  Substance Use Topics  . Alcohol use: Yes    Alcohol/week: 3.0 standard drinks    Types: 3 Cans of beer per week    Comment: occasional  . Drug use: Yes    Types: Marijuana    Comment: remote cocaine abuse, ongoing marijuana use.   Review of Systems Constitutional: Negative for fever. Cardiovascular: Negative for chest pain. Respiratory: Negative for shortness of breath. Gastrointestinal: Positive for rectal bleeding, negative for abdominal pain Musculoskeletal: Negative for back pain. Skin: Negative for rash. Neurological: Negative for headaches, focal weakness or numbness.  All systems negative/normal/unremarkable except as stated in the HPI  ____________________________________________   PHYSICAL EXAM:  VITAL SIGNS: ED Triage Vitals [04/16/19 2225]  Enc Vitals Group     BP (!) 157/80     Pulse Rate 84     Resp 20     Temp 98 F (36.7 C)     Temp Source Oral     SpO2 96 %     Weight 211 lb 10.3 oz (96 kg)     Height 6\' 1"  (1.854 m)     Head  Circumference      Peak Flow      Pain Score 0     Pain Loc      Pain Edu?      Excl. in [place]?     Constitutional: Alert and oriented. Well appearing and in no distress. Eyes: Conjunctivae are normal. Normal extraocular movements. ENT      Head: Normocephalic and atraumatic.      Nose: No congestion/rhinnorhea.      Mouth/Throat: Mucous membranes are moist.      Neck: No stridor. Cardiovascular: Normal rate, regular rhythm. No murmurs, rubs, or gallops. Respiratory: Normal respiratory effort without tachypnea nor retractions. Breath sounds are clear and equal bilaterally. No wheezes/rales/rhonchi. Gastrointestinal: Soft and nontender. Normal bowel sounds Musculoskeletal: Nontender with normal range of motion in extremities. No lower extremity tenderness nor edema. Neurologic:  Normal speech and language. No gross focal neurologic deficits are appreciated.  Skin:  Skin is warm, dry and intact. No rash noted. Psychiatric: Mood and affect are normal. Speech and behavior are normal.  ____________________________________________  ED COURSE:  As part of my medical decision making, I reviewed the following data within the [place] History obtained from family if available, nursing notes, old chart and ekg, as well as notes from prior ED visits. Patient presented for rectal bleeding, we will assess with labs and imaging as indicated at this time.   Procedures  DENMAN PICHARDO was evaluated in Emergency Department on 04/16/2019 for the symptoms described in the history of present illness. He was evaluated in the context of the global COVID-19 pandemic, which necessitated consideration that the patient might be at risk for infection with the SARS-CoV-2 virus that causes COVID-19. Institutional protocols and algorithms that pertain to the evaluation of patients at risk for COVID-19 are in a state of rapid change based on information released by regulatory bodies including the CDC and  federal and state organizations. These policies and algorithms were followed during the patient's care in the ED.  ____________________________________________   LABS (pertinent positives/negatives)  Labs Reviewed  COMPREHENSIVE METABOLIC PANEL - Abnormal; Notable for the following components:      Result Value   Glucose, Bld 104 (*)    BUN 66 (*)    Creatinine, Ser 13.88 (*)    Calcium 8.1 (*)    AST 14 (*)    GFR calc non Af Amer 4 (*)    GFR calc Af Amer 4 (*)    All other components within normal limits  CBC - Abnormal; Notable for the following components:   RBC 3.34 (*)    Hemoglobin 10.0 (*)    HCT 33.1 (*)    Platelets 112 (*)    All other components within normal limits  SARS CORONAVIRUS 2 (HOSPITAL ORDER, [facility])  PROTIME-INR  TYPE AND SCREEN   ____________________________________________   DIFFERENTIAL DIAGNOSIS   Diverticulosis, anemia, coagulopathy, end-stage renal disease  FINAL ASSESSMENT AND PLAN  Rectal bleeding   Plan: The patient had presented for rectal bleeding. Patient's labs did reveal some anemia although he is a dialysis patient.  He did not require any imaging while in the ER.  He has had diverticulitis in the past but does not have any pain currently, have no reason to believe that he has diverticulitis currently.  He likely has diverticulosis which is a causes of bleeding.  He did have another episode of bright red blood per rectum here while in the ER.  I will discuss with the hospitalist for admission.   Laurence Aly, MD    Note: This note was generated in part or whole with voice recognition software. Voice recognition is usually quite accurate but there are transcription errors that can and very often do occur. I apologize for any typographical errors that were not detected and corrected.     Earleen Newport, MD 04/16/19 2328

## 2019-04-16 NOTE — ED Notes (Signed)
ED Provider at bedside. 

## 2019-04-16 NOTE — ED Triage Notes (Addendum)
Pt has rectal bleeding for 4 hours.  Pt also reports abd pain.  Pt reports diarrhea.and bleeding.  No vomiting.  Dialysis yesterday.  Pt is on a blood thinner Pt alert speech clear.

## 2019-04-17 DIAGNOSIS — K625 Hemorrhage of anus and rectum: Secondary | ICD-10-CM

## 2019-04-17 LAB — CBC
HCT: 28.3 % — ABNORMAL LOW (ref 39.0–52.0)
Hemoglobin: 8.7 g/dL — ABNORMAL LOW (ref 13.0–17.0)
MCH: 30.2 pg (ref 26.0–34.0)
MCHC: 30.7 g/dL (ref 30.0–36.0)
MCV: 98.3 fL (ref 80.0–100.0)
Platelets: 98 10*3/uL — ABNORMAL LOW (ref 150–400)
RBC: 2.88 MIL/uL — ABNORMAL LOW (ref 4.22–5.81)
RDW: 14.3 % (ref 11.5–15.5)
WBC: 3.6 10*3/uL — ABNORMAL LOW (ref 4.0–10.5)
nRBC: 0 % (ref 0.0–0.2)

## 2019-04-17 LAB — SARS CORONAVIRUS 2 BY RT PCR (HOSPITAL ORDER, PERFORMED IN ~~LOC~~ HOSPITAL LAB): SARS Coronavirus 2: NEGATIVE

## 2019-04-17 LAB — HEMOGLOBIN AND HEMATOCRIT, BLOOD
HCT: 34.9 % — ABNORMAL LOW (ref 39.0–52.0)
HCT: 31.8 % — ABNORMAL LOW (ref 39.0–52.0)
Hemoglobin: 10.6 g/dL — ABNORMAL LOW (ref 13.0–17.0)
Hemoglobin: 9.9 g/dL — ABNORMAL LOW (ref 13.0–17.0)

## 2019-04-17 LAB — BASIC METABOLIC PANEL
Anion gap: 13 (ref 5–15)
BUN: 69 mg/dL — ABNORMAL HIGH (ref 6–20)
CO2: 26 mmol/L (ref 22–32)
Calcium: 7.6 mg/dL — ABNORMAL LOW (ref 8.9–10.3)
Chloride: 103 mmol/L (ref 98–111)
Creatinine, Ser: 14.77 mg/dL — ABNORMAL HIGH (ref 0.61–1.24)
GFR calc Af Amer: 4 mL/min — ABNORMAL LOW (ref >60)
GFR calc non Af Amer: 3 mL/min — ABNORMAL LOW (ref >60)
Glucose, Bld: 91 mg/dL (ref 70–99)
Potassium: 5 mmol/L (ref 3.5–5.1)
Sodium: 142 mmol/L (ref 135–145)

## 2019-04-17 LAB — PHOSPHORUS: Phosphorus: 7 mg/dL — ABNORMAL HIGH (ref 2.5–4.6)

## 2019-04-17 MED ORDER — EPOETIN ALFA 10000 UNIT/ML IJ SOLN: 4000.0000 [IU] | INTRAMUSCULAR | Status: DC

## 2019-04-17 MED ORDER — PANTOPRAZOLE SODIUM 40 MG IV SOLR
40.0000 mg | Freq: Two times a day (BID) | INTRAVENOUS | Status: DC
  Administered 2019-04-17: 40 mg via INTRAVENOUS
  Filled 2019-04-17 (×2): qty 40

## 2019-04-17 MED ORDER — EPOETIN ALFA 10000 UNIT/ML IJ SOLN
4000.0000 [IU] | INTRAMUSCULAR | Status: DC
  Administered 2019-04-17: 4000 [IU] via INTRAVENOUS

## 2019-04-17 MED ORDER — CHLORHEXIDINE GLUCONATE CLOTH 2 % EX PADS: 6.0000 | MEDICATED_PAD | Freq: Every day | CUTANEOUS | Status: DC

## 2019-04-17 MED ORDER — SODIUM CHLORIDE 0.9 % IV SOLN
INTRAVENOUS | Status: DC
  Administered 2019-04-17: 05:00:00 via INTRAVENOUS

## 2019-04-17 NOTE — Consult Note (Signed)
Jonathon Bellows , MD [address] [place], [place], [place], [address] [address], [place], [place], [place], [address] Phone: [phone]  Fax: [phone]  Consultation  Referring Provider:  Dr Anselm Jungling Primary Care Physician:  Patient, No Pcp Per Primary Gastroenterologist:None    Reason for Consultation:     Rectal bleeding   Date of Admission:  04/16/2019 Date of Consultation:  04/17/2019         HPI:   [name] [name] is a 58 y.o. male with a history of ESRD on dialysis presented to the ER with rectal bleeding that was painless.   Last colonoscopy in 2014 showed left sided colonic diverticulosis.   HB on admission 10 grams , previously 12.7 grams 2 months back.  No rise in BUN/cr ratio. He is on Plavix and asprin.  He is unclear how long or how much blood he has seen in his stool. Wasn't even sure when the last episode was.   Denies any complaints at this time.  Past Medical History:  Diagnosis Date  . Anemia of chronic disease    BL Hgb ~10  . CHF (congestive heart failure) ([place])    pt denies 04/01/14  . Diverticulitis   . End-stage renal disease on hemodialysis ([place])    HD m/w/f - right arm graft  . History of blood transfusion   . Hypertension   . Pneumonia    hx of  . Pneumonia   . Polysubstance abuse ([place])   . Renal insufficiency   . Secondary hyperparathyroidism ([place])   . Sleep disturbance   . Tobacco abuse     Past Surgical History:  Procedure Laterality Date  . APPENDECTOMY    . CHEST TUBE INSERTION  02/2013  . COLONOSCOPY  12/16/2012   Procedure: COLONOSCOPY;  Surgeon: Milus Banister, MD;  Location: [facility];  Service: Endoscopy;  Laterality: N/A;  . PLEURAL EFFUSION DRAINAGE  12/18/2012   Procedure: DRAINAGE OF PLEURAL EFFUSION;  Surgeon: Melrose Nakayama, MD;  Location: [facility];  Service: Thoracic;  Laterality: Right;  . surgery for horseshoe kidney    . TALC PLEURODESIS Right 02/05/2013   Procedure: Pietro Cassis;  Surgeon: Melrose Nakayama, MD;  Location: [facility];  Service: Thoracic;  Laterality: Right;  . TEE WITHOUT CARDIOVERSION N/A 01/16/2013   Procedure: TRANSESOPHAGEAL ECHOCARDIOGRAM (TEE);  Surgeon: Sanda Klein, MD;  Location: [facility] ENDOSCOPY;  Service: Cardiovascular;  Laterality: N/A;  . VIDEO ASSISTED THORACOSCOPY  12/18/2012   Procedure: VIDEO ASSISTED THORACOSCOPY;  Surgeon: Melrose Nakayama, MD;  Location: [facility];  Service: Thoracic;  Laterality: Right;  pleural peel  . VIDEO ASSISTED THORACOSCOPY (VATS)/THOROCOTOMY Right 02/19/2013   Procedure: VIDEO ASSISTED THORACOSCOPY (VATS)/THOROCOTOMY;  Surgeon: Melrose Nakayama, MD;  Location: [facility];  Service: Thoracic;  Laterality: Right;  Right Video Assisted Thoracoscopy, Right Thoracotomy, Decortication,   . VIDEO BRONCHOSCOPY WITH INSERTION OF INTERBRONCHIAL VALVE (IBV)  01/09/2013   Procedure: VIDEO BRONCHOSCOPY WITH INSERTION OF INTERBRONCHIAL VALVE (IBV);  Surgeon: Melrose Nakayama, MD;  Location: [facility];  Service: Thoracic;  Laterality: N/A;  placement of 2 Intrabronchial valves in right lower lobe  . VIDEO BRONCHOSCOPY WITH INSERTION OF INTERBRONCHIAL VALVE (IBV) Right 02/19/2013   Procedure: VIDEO BRONCHOSCOPY WITH INSERTION OF INTERBRONCHIAL VALVE (IBV);  Surgeon: Melrose Nakayama, MD;  Location: [facility];  Service: Thoracic;  Laterality: Right;  removal of two interbronchial valves right chest    Prior to Admission medications   Medication Sig Start Date End  Date Taking? Authorizing Provider  clopidogrel (PLAVIX) 75 MG tablet Take 75 mg by mouth daily. 03/29/19  Yes [provider]  acetaminophen (TYLENOL) 500 MG tablet Take 1,000 mg by mouth every 8 (eight) hours as needed for moderate pain.    [provider]  amLODipine (NORVASC) 10 MG tablet Take 10 mg by mouth every evening. 02/19/19   [provider]  aspirin EC 81 MG tablet Take 81 mg by mouth daily. 07/04/18 07/04/19  [provider]  calcium acetate (PHOSLO) 667  MG capsule Take 2,668 mg by mouth 3 (three) times daily with meals.     [provider]  cinacalcet (SENSIPAR) 90 MG tablet Take 90 mg by mouth daily.    [provider]  diphenhydrAMINE (BENADRYL) 25 mg capsule Take 25 mg by mouth every 6 (six) hours as needed.     [provider]  metoprolol succinate (TOPROL-XL) 50 MG 24 hr tablet Take 50 mg by mouth daily. Take with or immediately following a meal.    [provider]  ondansetron (ZOFRAN ODT) 4 MG disintegrating tablet Take 1 tablet (4 mg total) by mouth every 8 (eight) hours as needed for nausea or vomiting. Patient not taking: Reported on 04/16/2019 01/30/19   Lavonia Drafts, MD  promethazine (PHENERGAN) 12.5 MG tablet TAKE 1 TAB BY MOUTH ONCE A DAY AS NEEDED FOR VOMITING. 04/10/19   [provider]    Family History  Problem Relation Age of Onset  . Thyroid disease Mother   . Renal Disease Maternal Grandmother   . Diabetes Maternal Grandmother   . Hypertension Maternal Aunt   . Hypertension Maternal Uncle      Social History   Tobacco Use  . Smoking status: Current Every Day Smoker    Packs/day: 6.00    Years: 20.00    Pack years: 120.00    Types: Cigarettes  . Smokeless tobacco: Current User  Substance Use Topics  . Alcohol use: Yes    Alcohol/week: 3.0 standard drinks    Types: 3 Cans of beer per week    Comment: occasional  . Drug use: Yes    Types: Marijuana    Comment: remote cocaine abuse, ongoing marijuana use.    Allergies as of 04/16/2019 - Review Complete 04/16/2019  Allergen Reaction Noted  . Other  04/21/2013  . Tape Other (See Comments) 02/17/2013    Review of Systems:    All systems reviewed and negative except where noted in HPI.   Physical Exam:  Vital signs in last 24 hours: Temp:  [97.8 F (36.6 C)-98.3 F (36.8 C)] 97.8 F (36.6 C) (05/08 0948) Pulse Rate:  [70-84] 74 (05/08 0948) Resp:  [15-20] 16 (05/08 0948) BP: (119-160)/(63-92) 160/87 (05/08  0948) SpO2:  [96 %-100 %] 100 % (05/08 0948) Weight:  [96 kg] 96 kg (05/07 2225) Last BM Date: 04/17/19 General:   Pleasant, cooperative in NAD Head:  Normocephalic and atraumatic. Eyes:   No icterus.   Conjunctiva pink. PERRLA. Ears:  Normal auditory acuity. Neck:  Supple; no masses or thyroidomegaly Lungs: Respirations even and unlabored. Lungs clear to auscultation bilaterally.   No wheezes, crackles, or rhonchi.  Heart:  Regular rate and rhythm;  Without murmur, clicks, rubs or gallops Abdomen:  Soft, nondistended, nontender. Normal bowel sounds. No appreciable masses or hepatomegaly.  No rebound or guarding.  Neurologic:  Alert and oriented x3;  grossly normal neurologically. Skin:  Intact without significant lesions or rashes. Cervical Nodes:  No significant  cervical adenopathy. Psych:  Alert and cooperative. Normal affect.  LAB RESULTS: Recent Labs    04/16/19 2243 04/17/19 0513  WBC 4.2 3.6*  HGB 10.0* 8.7*  HCT 33.1* 28.3*  PLT 112* 98*   BMET Recent Labs    04/16/19 2243 04/17/19 0513  NA 142 142  K 4.6 5.0  CL 99 103  CO2 28 26  GLUCOSE 104* 91  BUN 66* 69*  CREATININE 13.88* 14.77*  CALCIUM 8.1* 7.6*   LFT Recent Labs    04/16/19 2243  PROT 7.1  ALBUMIN 3.7  AST 14*  ALT 9  ALKPHOS 49  BILITOT 0.4   PT/INR Recent Labs    04/16/19 2243  LABPROT 13.8  INR 1.1    STUDIES: No results found.    Impression / Plan:   FURIOUS CHIARELLI is a 58 y.o. y/o male with a history of ESRD, on Plavix, admitted with rectal bleeding. 4 grams drop in HB from baseline. No change in BUN?CR ratio. Likely a diverticular bleed.  Last colonoscopy in 2014 showing left sided colonic diverticulosis performed by Dr Ardis Hughs.   Plan  1. Monitor CBC and transfuse as needed 2. IF there is brisk overt rectal bleeding get tagged RBC scan  3. Plan for colonoscopy when off Plavix for 5 days.     Thank you for involving me in the care of this patient.      LOS: 1 day    Jonathon Bellows, MD  04/17/2019, 10:17 AM

## 2019-04-17 NOTE — ED Notes (Signed)
Pt c/o bloody stools. Pt states he has hx of GI bleeds in the past. Pt is ambulatory and alert and oriented x4. Pt has had 3 small bowel movements bright red in color today. Pt not c/o pain at this time. Pt denies N/V.

## 2019-04-17 NOTE — Progress Notes (Signed)
Patient is current at [facility] MWF 6:00. At this moment patient will resume same chair time upon discharge.

## 2019-04-17 NOTE — Progress Notes (Signed)
Pt back on unit from dialysis.

## 2019-04-17 NOTE — H&P (Signed)
[facility] [place] NAME: [name] [name]    MR#:  [id number]  DATE OF BIRTH:  December 25, 1960  DATE OF ADMISSION:  04/16/2019  PRIMARY CARE PHYSICIAN: Patient, No Pcp Per   REQUESTING/REFERRING PHYSICIAN:  Lenise Arena, MD  CHIEF COMPLAINT:   Chief Complaint  Patient presents with  . Rectal Bleeding   The patient was seen and examined on 04/17/2019  HISTORY OF PRESENT ILLNESS:  [name] [name]  is a 58 y.o. male with a known history of multiple medical problems that will be mentioned below including end-stage renal disease on hemodialysis and diverticulosis, who presented to the emergency room with acute onset of bright red bleeding per rectum for 3 episodes at home and 1 in the ER with no abdominal pain.  He denies any nausea or vomiting.  No melanotic stools.  No fever or chills.  No other bleeding diathesis.  He denies any dysuria, oliguria or hematuria or flank pain.  No cough or wheezing or hemoptysis. No recent sick exposure or exposure to large crowds.  No recent travels out of the state or out of the country or to a COVID-19 epicenter.  Upon presentation to the ER, blood pressure was 157/80 otherwise vital signs were within normal.  Labs revealed a hemoglobin of 10 and hematocrit of 33.1, platelets of 112 and WBC 4.2.  BUN of 66 and creatinine of 13.8 with potassium of 4.6.  INR is 1.1 PT 13.8.  The patient's COVID 19 test came back negative.  On 221/2020 he had an abdominal and pelvic CT scan that showed large fat-containing and supraumbilical hernia, colonic diverticulosis without diverticulitis and extensive body wall venous collaterals.  The patient will be admitted to a medical monitored bed for further evaluation and management.   PAST MEDICAL HISTORY:   Past Medical History:  Diagnosis Date  . Anemia of chronic disease    BL Hgb ~10  . CHF (congestive heart failure) ([place])    pt denies 04/01/14  . Diverticulitis   . End-stage  renal disease on hemodialysis ([place])    HD m/w/f - right arm graft  . History of blood transfusion   . Hypertension   . Pneumonia    hx of  . Pneumonia   . Polysubstance abuse ([place])   . Renal insufficiency   . Secondary hyperparathyroidism ([place])   . Sleep disturbance   . Tobacco abuse     PAST SURGICAL HISTORY:   Past Surgical History:  Procedure Laterality Date  . APPENDECTOMY    . CHEST TUBE INSERTION  02/2013  . COLONOSCOPY  12/16/2012   Procedure: COLONOSCOPY;  Surgeon: Milus Banister, MD;  Location: [facility];  Service: Endoscopy;  Laterality: N/A;  . PLEURAL EFFUSION DRAINAGE  12/18/2012   Procedure: DRAINAGE OF PLEURAL EFFUSION;  Surgeon: Melrose Nakayama, MD;  Location: [facility];  Service: Thoracic;  Laterality: Right;  . surgery for horseshoe kidney    . TALC PLEURODESIS Right 02/05/2013   Procedure: Pietro Cassis;  Surgeon: Melrose Nakayama, MD;  Location: [facility];  Service: Thoracic;  Laterality: Right;  . TEE WITHOUT CARDIOVERSION N/A 01/16/2013   Procedure: TRANSESOPHAGEAL ECHOCARDIOGRAM (TEE);  Surgeon: Sanda Klein, MD;  [facility];  Service: Cardiovascular;  Laterality: N/A;  . VIDEO ASSISTED THORACOSCOPY  12/18/2012   Procedure: VIDEO ASSISTED THORACOSCOPY;  Surgeon: Melrose Nakayama, MD;  Location: [facility];  Service: Thoracic;  Laterality: Right;  pleural peel  . VIDEO ASSISTED THORACOSCOPY (  VATS)/THOROCOTOMY Right 02/19/2013   Procedure: VIDEO ASSISTED THORACOSCOPY (VATS)/THOROCOTOMY;  Surgeon: Melrose Nakayama, MD;  Location: [facility];  Service: Thoracic;  Laterality: Right;  Right Video Assisted Thoracoscopy, Right Thoracotomy, Decortication,   . VIDEO BRONCHOSCOPY WITH INSERTION OF INTERBRONCHIAL VALVE (IBV)  01/09/2013   Procedure: VIDEO BRONCHOSCOPY WITH INSERTION OF INTERBRONCHIAL VALVE (IBV);  Surgeon: Melrose Nakayama, MD;  Location: [facility];  Service: Thoracic;  Laterality: N/A;  placement of 2 Intrabronchial valves in right lower lobe  .  VIDEO BRONCHOSCOPY WITH INSERTION OF INTERBRONCHIAL VALVE (IBV) Right 02/19/2013   Procedure: VIDEO BRONCHOSCOPY WITH INSERTION OF INTERBRONCHIAL VALVE (IBV);  Surgeon: Melrose Nakayama, MD;  Location: [facility];  Service: Thoracic;  Laterality: Right;  removal of two interbronchial valves right chest    SOCIAL HISTORY:   Social History   Tobacco Use  . Smoking status: Current Every Day Smoker    Packs/day: 6.00    Years: 20.00    Pack years: 120.00    Types: Cigarettes  . Smokeless tobacco: Current User  Substance Use Topics  . Alcohol use: Yes    Alcohol/week: 3.0 standard drinks    Types: 3 Cans of beer per week    Comment: occasional    FAMILY HISTORY:   Family History  Problem Relation Age of Onset  . Thyroid disease Mother   . Renal Disease Maternal Grandmother   . Diabetes Maternal Grandmother   . Hypertension Maternal Aunt   . Hypertension Maternal Uncle     DRUG ALLERGIES:   Allergies  Allergen Reactions  . Other     Other reaction(s): Other (See Comments) blister  . Tape Other (See Comments)    Plastic Tape Causes blister    REVIEW OF SYSTEMS:   ROS As per history of present illness. All pertinent systems were reviewed above. Constitutional,  HEENT, cardiovascular, respiratory, GI, GU, musculoskeletal, neuro, psychiatric, endocrine,  integumentary and hematologic systems were reviewed and are otherwise  negative/unremarkable except for positive findings mentioned above in the HPI.   MEDICATIONS AT HOME:   Prior to Admission medications   Medication Sig Start Date End Date Taking? Authorizing Provider  clopidogrel (PLAVIX) 75 MG tablet Take 75 mg by mouth daily. 03/29/19  Yes [provider]  acetaminophen (TYLENOL) 500 MG tablet Take 1,000 mg by mouth every 8 (eight) hours as needed for moderate pain.    [provider]  amLODipine (NORVASC) 10 MG tablet Take 10 mg by mouth every evening. 02/19/19   [provider]   aspirin EC 81 MG tablet Take 81 mg by mouth daily. 07/04/18 07/04/19  [provider]  calcium acetate (PHOSLO) 667 MG capsule Take 2,668 mg by mouth 3 (three) times daily with meals.     [provider]  cinacalcet (SENSIPAR) 90 MG tablet Take 90 mg by mouth daily.    [provider]  diphenhydrAMINE (BENADRYL) 25 mg capsule Take 25 mg by mouth every 6 (six) hours as needed.     [provider]  metoprolol succinate (TOPROL-XL) 50 MG 24 hr tablet Take 50 mg by mouth daily. Take with or immediately following a meal.    [provider]  ondansetron (ZOFRAN ODT) 4 MG disintegrating tablet Take 1 tablet (4 mg total) by mouth every 8 (eight) hours as needed for nausea or vomiting. Patient not taking: Reported on 04/16/2019 01/30/19   Lavonia Drafts, MD  promethazine (PHENERGAN) 12.5 MG tablet TAKE 1 TAB BY MOUTH ONCE A DAY AS NEEDED  FOR VOMITING. 04/10/19   [provider]      VITAL SIGNS:  Blood pressure (!) 151/92, pulse 73, temperature 98.1 F (36.7 C), temperature source Oral, resp. rate 18, height 6\' 1"  (1.854 m), weight 96 kg, SpO2 99 %.  PHYSICAL EXAMINATION:  Physical Exam  GENERAL:  58 y.o.-year-old African-American male patient lying in the bed with no acute distress.  EYES: Pupils equal, round, reactive to light and accommodation. No scleral icterus. Extraocular muscles intact.  HEENT: Head atraumatic, normocephalic. Oropharynx and nasopharynx clear.  NECK:  Supple, no jugular venous distention. No thyroid enlargement, no tenderness.  LUNGS: Normal breath sounds bilaterally, no wheezing, rales,rhonchi or crepitation. No use of accessory muscles of respiration.  CARDIOVASCULAR: Regular rate and rhythm, S1, S2 normal. No murmurs, rubs, or gallops.  ABDOMEN: Soft, nondistended, nontender. Bowel sounds present. No organomegaly or mass.  EXTREMITIES: No pedal edema, cyanosis, or clubbing.  NEUROLOGIC: Cranial nerves II through XII are  intact. Muscle strength 5/5 in all extremities. Sensation intact. Gait not checked.  PSYCHIATRIC: The patient is alert and oriented x 3.  Normal affect and good eye contact. SKIN: No obvious rash, lesion, or ulcer.   LABORATORY PANEL:   CBC Recent Labs  Lab 04/16/19 2243  WBC 4.2  HGB 10.0*  HCT 33.1*  PLT 112*   ------------------------------------------------------------------------------------------------------------------  Chemistries  Recent Labs  Lab 04/16/19 2243  NA 142  K 4.6  CL 99  CO2 28  GLUCOSE 104*  BUN 66*  CREATININE 13.88*  CALCIUM 8.1*  AST 14*  ALT 9  ALKPHOS 49  BILITOT 0.4   ------------------------------------------------------------------------------------------------------------------  Cardiac Enzymes No results for input(s): TROPONINI in the last 168 hours. ------------------------------------------------------------------------------------------------------------------  RADIOLOGY:  No results found.    IMPRESSION AND PLAN:   #1.  Painless bright red bleeding per rectum.  The patient will be admitted to the medical [facility] monitored bed.  We will follow serial hemoglobin and hematocrits.  He will be gently hydrated with IV normal saline.  He was typed and crossmatched.  I do not believe that he needs packed red blood cells transfusion at this time.  A GI consultation by Dr. Bonna Gains will be obtained.  I contacted her about the consult.  2.  Acute blood loss anemia secondary to GI bleeding.  Management as above.  3.  End-stage renal disease on hemodialysis on Monday Wednesday Friday.  A nephrology consultation will be obtained by Dr. Juleen China who was contacted about the consult.  4.  Hypertension.  Amlodipine and Toprol-XL will be continued.  5.  DVT prophylaxis.  SCDs.  Medical prophylaxis currently contraindicated due to his GI bleeding.  All the records are reviewed and case discussed with ED provider. The plan of care was discussed in  details with the patient (and family). I answered all questions. The patient agreed to proceed with the above mentioned plan. Further management will depend upon hospital course.   CODE STATUS: Full code  TOTAL TIME TAKING CARE OF THIS PATIENT: 50 minutes.    Christel Mormon M.D on 04/17/2019 at 2:05 AM  Pager - [phone]  After 6pm go to [email or web address] - Proofreader  [facility]  Office  [phone]  CC: Primary care physician; Patient, No Pcp Per   Note: This dictation was prepared with Dragon dictation along with smaller phrase technology. Any transcriptional errors that result from this process are unintentional.

## 2019-04-17 NOTE — Progress Notes (Signed)
[facility] at [facility] NAME: [name] [name]    MR#:  [id number]  DATE OF BIRTH:  04-21-1961  SUBJECTIVE:  CHIEF COMPLAINT:   Chief Complaint  Patient presents with  . Rectal Bleeding   Came with rectal bleeding.  He had 3-4 episodes last night.  No bleeding since morning.  Hemoglobin dropped some but still does not require blood transfusion now.  No complaint of abdominal pain diarrhea or vomiting now . REVIEW OF SYSTEMS:   CONSTITUTIONAL: No fever, fatigue or weakness.  EYES: No blurred or double vision.  EARS, NOSE, AND THROAT: No tinnitus or ear pain.  RESPIRATORY: No cough, shortness of breath, wheezing or hemoptysis.  CARDIOVASCULAR: No chest pain, orthopnea, edema.  GASTROINTESTINAL: No nausea, vomiting, diarrhea or abdominal pain.  GENITOURINARY: No dysuria, hematuria.  ENDOCRINE: No polyuria, nocturia,  HEMATOLOGY: No anemia, easy bruising or bleeding SKIN: No rash or lesion. MUSCULOSKELETAL: No joint pain or arthritis.   NEUROLOGIC: No tingling, numbness, weakness.  PSYCHIATRY: No anxiety or depression.   ROS  DRUG ALLERGIES:   Allergies  Allergen Reactions  . Other     Other reaction(s): Other (See Comments) blister  . Tape Other (See Comments)    Plastic Tape Causes blister    VITALS:  Blood pressure 135/88, pulse 78, temperature 97.6 F (36.4 C), temperature source Oral, resp. rate 17, height 6\' 1"  (1.854 m), weight 96 kg, SpO2 99 %.  PHYSICAL EXAMINATION:  GENERAL:  58 y.o.-year-old patient lying in the bed with no acute distress.  EYES: Pupils equal, round, reactive to light and accommodation. No scleral icterus. Extraocular muscles intact.  HEENT: Head atraumatic, normocephalic. Oropharynx and nasopharynx clear.  NECK:  Supple, no jugular venous distention. No thyroid enlargement, no tenderness.  LUNGS: Normal breath sounds bilaterally, no wheezing, rales,rhonchi or crepitation. No use of accessory muscles of  respiration.  CARDIOVASCULAR: S1, S2 normal. No murmurs, rubs, or gallops.  ABDOMEN: Soft, nontender, nondistended. Bowel sounds present. No organomegaly or mass.  EXTREMITIES: No pedal edema, cyanosis, or clubbing.  NEUROLOGIC: Cranial nerves II through XII are intact. Muscle strength 5/5 in all extremities. Sensation intact. Gait not checked.  PSYCHIATRIC: The patient is alert and oriented x 3.  SKIN: No obvious rash, lesion, or ulcer.   Physical Exam LABORATORY PANEL:   CBC Recent Labs  Lab 04/17/19 0513 04/17/19 1153  WBC 3.6*  --   HGB 8.7* 10.6*  HCT 28.3* 34.9*  PLT 98*  --    ------------------------------------------------------------------------------------------------------------------  Chemistries  Recent Labs  Lab 04/16/19 2243 04/17/19 0513  NA 142 142  K 4.6 5.0  CL 99 103  CO2 28 26  GLUCOSE 104* 91  BUN 66* 69*  CREATININE 13.88* 14.77*  CALCIUM 8.1* 7.6*  AST 14*  --   ALT 9  --   ALKPHOS 49  --   BILITOT 0.4  --    ------------------------------------------------------------------------------------------------------------------  Cardiac Enzymes No results for input(s): TROPONINI in the last 168 hours. ------------------------------------------------------------------------------------------------------------------  RADIOLOGY:  No results found.  ASSESSMENT AND PLAN:   Active Problems:   Rectal bleeding  1.  Painless bright red bleeding per rectum.   follow serial hemoglobin and hematocrits.   Hemoglobin dropped compared to baseline but still no need for transfusion currently. We will give PPI IV twice daily.    2.  Acute blood loss anemia secondary to GI bleeding.  Management as above.  3.  End-stage renal disease on hemodialysis on Monday Wednesday Friday.  A nephrology consultation by Dr. Juleen China .  4.  Hypertension.  Amlodipine and Toprol-XL .  5.  DVT prophylaxis.  SCDs.  Medical prophylaxis currently contraindicated due to  his GI bleeding.    All the records are reviewed and case discussed with Care Management/Social Workerr. Management plans discussed with the patient, family and they are in agreement.  CODE STATUS: Full.  TOTAL TIME TAKING CARE OF THIS PATIENT: 35 minutes.     POSSIBLE D/C IN 35 DAYS, DEPENDING ON CLINICAL CONDITION.   Vaughan Basta M.D on 04/17/2019   Between 7am to 6pm - Pager - [phone]  After 6pm go to [email or web address] - password [email or web address]  Office  [phone]  CC: Primary care physician; Patient, No Pcp Per  Note: This dictation was prepared with Dragon dictation along with smaller phrase technology. Any transcriptional errors that result from this process are unintentional.

## 2019-04-17 NOTE — Progress Notes (Signed)
Pre HD Assessment    04/17/19 1220  Neurological  Level of Consciousness Alert  Orientation Level Oriented X4  Respiratory  Respiratory Pattern Regular  Chest Assessment Chest expansion symmetrical  Bilateral Breath Sounds Clear;Diminished  Cough None  Cardiac  Pulse Regular  Heart Sounds S1, S2  ECG Monitor Yes  Cardiac Rhythm NSR  Vascular  R Radial Pulse +2  L Radial Pulse +2  Edema Generalized  Generalized Edema +1  Psychosocial  Psychosocial (WDL) WDL

## 2019-04-17 NOTE — Progress Notes (Signed)
Pre HD Tx    04/17/19 1224  Hand-Off documentation  Report given to (Full Name) Beatris Ship, RN   Vital Signs  Temp 97.6 F (36.4 C)  Temp Source Oral  Pulse Rate 60  Pulse Rate Source Monitor  Resp 16  BP (!) 151/85  BP Location Left Arm  BP Method Automatic  Patient Position (if appropriate) Lying  Oxygen Therapy  SpO2 100 %  O2 Device Room Air  Pain Assessment  Pain Scale 0-10  Pain Score 0  Dialysis Weight  Weight 96 kg  Type of Weight Pre-Dialysis  Time-Out for Hemodialysis  What Procedure? HD  Pt Identifiers(min of two) First/Last Name;MRN/Account#  Correct Site? Yes  Correct Side? Yes  Correct Procedure? Yes  Consents Verified? Yes  Rad Studies Available? N/A  Engineer, civil (consulting) Number 2  Station Number 2  UF/Alarm Test Passed  Conductivity: Meter 14  Conductivity: Machine  14  pH 7.2  Reverse Osmosis Main  Normal Saline Lot Number [id number]  Dialyzer Lot Number [id number]  Disposable Set Lot Number [id number]  Machine Temperature 98.6 F (37 C)  Musician and Audible Yes  Blood Lines Intact and Secured Yes  Pre Treatment Patient Checks  Vascular access used during treatment Graft  Hemodialysis Consent Verified Yes  Hemodialysis Standing Orders Initiated Yes  ECG (Telemetry) Monitor On Yes  Prime Ordered Normal Saline  Length of  DialysisTreatment -hour(s) 3 Hour(s)  Dialysis Treatment Comments Na 140  Dialyzer Elisio 17H NR  Dialysate 2K, 2.5 Ca  Dialysis Anticoagulant None  Dialysate Flow Ordered 600  Blood Flow Rate Ordered 400 mL/min  Ultrafiltration Goal 1.5 Liters  Dialysis Blood Pressure Support Ordered Normal Saline  Education / Care Plan  Dialysis Education Provided Yes  Documented Education in Care Plan Yes  Vascular Access Right Upper arm Arteriovenous fistula  No Placement Date or Time found.   Placed prior to admission: Yes  Orientation: Right  Access Location: Upper arm  Access Type: Arteriovenous fistula  Site Condition  No complications  Fistula / Graft Assessment Present;Thrill;Bruit  Status Patent  Drainage Description None

## 2019-04-17 NOTE — Progress Notes (Signed)
[facility]  ROUNDING NOTE   Subjective:   [name] admitted to [facility] on 04/16/2019 for Rectal bleeding [K62.5] End stage renal disease on dialysis [facility]) [N18.6, Z99.2]  Patient reports eating nuts for the last few days.   Objective:  Vital signs in last 24 hours:  Temp:  [97.8 F (36.6 C)-98.3 F (36.8 C)] 97.8 F (36.6 C) (05/08 0948) Pulse Rate:  [60-84] 66 (05/08 1245) Resp:  [14-20] 15 (05/08 1245) BP: (119-160)/(63-92) 147/85 (05/08 1245) SpO2:  [96 %-100 %] 100 % (05/08 1245) Weight:  [96 kg] 96 kg (05/07 2225)  Weight change:  Filed Weights   04/16/19 2225  Weight: 96 kg    Intake/Output: I/O last 3 completed shifts: In: 1 [I.V.:1] Out: -    Intake/Output this shift:  No intake/output data recorded.  Physical Exam: General: NAD,   Head: Normocephalic, atraumatic. Moist oral mucosal membranes  Eyes: Anicteric, PERRL  Neck: Supple, trachea midline  Lungs:  Clear to auscultation  Heart: Regular rate and rhythm  Abdomen:  Soft, nontender,   Extremities: no peripheral edema.  Neurologic: Nonfocal, moving all four extremities  Skin: No lesions  Access: Left AVG    Basic Metabolic Panel: Recent Labs  Lab 04/16/19 2243 04/17/19 0513  NA 142 142  K 4.6 5.0  CL 99 103  CO2 28 26  GLUCOSE 104* 91  BUN 66* 69*  CREATININE 13.88* 14.77*  CALCIUM 8.1* 7.6*  PHOS 7.0*  --     Liver Function Tests: Recent Labs  Lab 04/16/19 2243  AST 14*  ALT 9  ALKPHOS 49  BILITOT 0.4  PROT 7.1  ALBUMIN 3.7   No results for input(s): LIPASE, AMYLASE in the last 168 hours. No results for input(s): AMMONIA in the last 168 hours.  CBC: Recent Labs  Lab 04/16/19 2243 04/17/19 0513 04/17/19 1153  WBC 4.2 3.6*  --   HGB 10.0* 8.7* 10.6*  HCT 33.1* 28.3* 34.9*  MCV 99.1 98.3  --   PLT 112* 98*  --     Cardiac Enzymes: No results for input(s): CKTOTAL, CKMB, CKMBINDEX, TROPONINI in the last 168 hours.  BNP: Invalid input(s):  POCBNP  CBG: No results for input(s): GLUCAP in the last 168 hours.  Microbiology: Results for orders placed or performed during the hospital encounter of 04/16/19  SARS Coronavirus 2 (CEPHEID - Performed in [place] hospital lab), Hosp Order     Status: None   Collection Time: 04/16/19 11:26 PM  Result Value Ref Range Status   SARS Coronavirus 2 NEGATIVE NEGATIVE Final    Comment: (NOTE) If result is NEGATIVE SARS-CoV-2 target nucleic acids are NOT DETECTED. The SARS-CoV-2 RNA is generally detectable in upper and lower  respiratory specimens during the acute phase of infection. The lowest  concentration of SARS-CoV-2 viral copies this assay can detect is 250  copies / mL. A negative result does not preclude SARS-CoV-2 infection  and should not be used as the sole basis for treatment or other  patient management decisions.  A negative result may occur with  improper specimen collection / handling, submission of specimen other  than nasopharyngeal swab, presence of viral mutation(s) within the  areas targeted by this assay, and inadequate number of viral copies  (<250 copies / mL). A negative result must be combined with clinical  observations, patient history, and epidemiological information. If result is POSITIVE SARS-CoV-2 target nucleic acids are DETECTED. The SARS-CoV-2 RNA is generally detectable in upper and  lower  respiratory specimens dur ing the acute phase of infection.  Positive  results are indicative of active infection with SARS-CoV-2.  Clinical  correlation with patient history and other diagnostic information is  necessary to determine patient infection status.  Positive results do  not rule out bacterial infection or co-infection with other viruses. If result is PRESUMPTIVE POSTIVE SARS-CoV-2 nucleic acids MAY BE PRESENT.   A presumptive positive result was obtained on the submitted specimen  and confirmed on repeat testing.  While 2019 novel coronavirus   (SARS-CoV-2) nucleic acids may be present in the submitted sample  additional confirmatory testing may be necessary for epidemiological  and / or clinical management purposes  to differentiate between  SARS-CoV-2 and other Sarbecovirus currently known to infect humans.  If clinically indicated additional testing with an alternate test  methodology [phone]) is advised. The SARS-CoV-2 RNA is generally  detectable in upper and lower respiratory sp ecimens during the acute  phase of infection. The expected result is Negative. Fact Sheet for Patients:  StrictlyIdeas.no Fact Sheet for Healthcare Providers: BankingDealers.co.za This test is not yet approved or cleared by the [place] FDA and has been authorized for detection and/or diagnosis of SARS-CoV-2 by FDA under an Emergency Use Authorization (EUA).  This EUA will remain in effect (meaning this test can be used) for the duration of the COVID-19 declaration under Section 564(b)(1) of the Act, 21 U.S.C. section 360bbb-3(b)(1), unless the authorization is terminated or revoked sooner. Performed at [facility], [address] [place], [place] [address]     Coagulation Studies: Recent Labs    04/16/19 2243  LABPROT 13.8  INR 1.1    Urinalysis: No results for input(s): COLORURINE, LABSPEC, PHURINE, GLUCOSEU, HGBUR, BILIRUBINUR, KETONESUR, PROTEINUR, UROBILINOGEN, NITRITE, LEUKOCYTESUR in the last 72 hours.  Invalid input(s): APPERANCEUR    Imaging: No results found.   Medications:    . amLODipine  10 mg Oral Daily  . calcium acetate  2,668 mg Oral TID WC  . Chlorhexidine Gluconate Cloth  6 each Topical Q0600  . metoprolol succinate  50 mg Oral Daily  . pantoprazole (PROTONIX) IV  40 mg Intravenous Q12H   acetaminophen **OR** acetaminophen, traZODone  Assessment/ Plan:  [name] is a 58 y.o. black male with end stage renal disease on  hemodialysis, hypertension, substance abuse, congestive heart failure, obstructive sleep apnea, who is admitted to [facility] on 04/16/2019 for Rectal bleeding [K62.5] End stage renal disease on dialysis ([place]) [N18.6, Z99.2]  [facility] Left AVG (Hero) 96kg  1. End Stage Renal disease: seen and examined on hemodialysis treatment. Tolerating treatment well.   2. Hypertension: restarted on metoprolol and amlodipine  3. Secondary Hyperparathyroidism: with hyperphosphatemia. On Parsabiv as outpatient - discontinue cinacalcet - Calcium acetate with meals.   4. Anemia of chronic kidney disease: hemoglobin 8.7. With thrombocytopenia.  GI bleed  Hemoglobin 10.6. - Appreciate GI input - EPO with HD treatment    LOS: 1 Anneli Bing 5/8/20201:04 PM

## 2019-04-17 NOTE — Progress Notes (Signed)
HD Tx completed    04/17/19 1545  Vital Signs  Pulse Rate 69  Pulse Rate Source Monitor  Resp 18  BP 131/77  BP Location Left Arm  BP Method Automatic  Patient Position (if appropriate) Lying  Oxygen Therapy  SpO2 99 %  O2 Device Room Air  Pulse Oximetry Type Continuous  During Hemodialysis Assessment  HD Safety Checks Performed Yes  KECN 65.6 KECN  Dialysis Fluid Bolus Normal Saline  Bolus Amount (mL) 250 mL  Intra-Hemodialysis Comments Tx completed;Tolerated well

## 2019-04-17 NOTE — Progress Notes (Signed)
Post HD Assessment: Once pt stopped bleeding from cannulation sites, Pt proceeded to get out of the bed, I told the pt he would have to stay seated until the transport arrived. I then walked over to call the primary nurse and the pt then got out of the bed and walked out of the unit, I then called the emergency line to report the patient leaving the unit. The pt was found and is back in his room.    04/17/19 1630  Neurological  Level of Consciousness Alert  Orientation Level Oriented X4  Respiratory  Respiratory Pattern Regular  Chest Assessment Chest expansion symmetrical  Bilateral Breath Sounds Clear  Cough None  Cardiac  Pulse Regular  Heart Sounds S1, S2  ECG Monitor Yes  Cardiac Rhythm NSR  Vascular  R Radial Pulse +2  L Radial Pulse +2  Edema Generalized  Generalized Edema +1  Psychosocial  Psychosocial (WDL) WDL

## 2019-04-18 LAB — BASIC METABOLIC PANEL
Anion gap: 11 (ref 5–15)
BUN: 45 mg/dL — ABNORMAL HIGH (ref 6–20)
CO2: 30 mmol/L (ref 22–32)
Calcium: 7.8 mg/dL — ABNORMAL LOW (ref 8.9–10.3)
Chloride: 101 mmol/L (ref 98–111)
Creatinine, Ser: 11.3 mg/dL — ABNORMAL HIGH (ref 0.61–1.24)
GFR calc Af Amer: 5 mL/min — ABNORMAL LOW (ref >60)
GFR calc non Af Amer: 4 mL/min — ABNORMAL LOW (ref >60)
Glucose, Bld: 86 mg/dL (ref 70–99)
Potassium: 4.6 mmol/L (ref 3.5–5.1)
Sodium: 142 mmol/L (ref 135–145)

## 2019-04-18 LAB — CBC
HCT: 30.3 % — ABNORMAL LOW (ref 39.0–52.0)
Hemoglobin: 9.2 g/dL — ABNORMAL LOW (ref 13.0–17.0)
MCH: 30.2 pg (ref 26.0–34.0)
MCHC: 30.4 g/dL (ref 30.0–36.0)
MCV: 99.3 fL (ref 80.0–100.0)
Platelets: 103 10*3/uL — ABNORMAL LOW (ref 150–400)
RBC: 3.05 MIL/uL — ABNORMAL LOW (ref 4.22–5.81)
RDW: 14.1 % (ref 11.5–15.5)
WBC: 3.5 10*3/uL — ABNORMAL LOW (ref 4.0–10.5)
nRBC: 0 % (ref 0.0–0.2)

## 2019-04-18 LAB — HIV ANTIBODY (ROUTINE TESTING W REFLEX): HIV Screen 4th Generation wRfx: NONREACTIVE

## 2019-04-18 NOTE — Progress Notes (Signed)
MD ordered patient to be discharged home.  Discharge instructions were reviewed with the patient and he voiced understanding.  Follow-up appointment was made.  No prescriptions given to the patient.  IV was removed with catheter intact.  All patients questions were answered.  Patient insisted on walking [place] to the medical mall by himself.  His ride was down stairs waiting on him.

## 2019-04-18 NOTE — Progress Notes (Signed)
[facility]  ROUNDING NOTE   Subjective:   Hemodialysis treatment yesterday. Tolerated treatment well.   Patient wants to go home. Reports no more bleeding.   Objective:  Vital signs in last 24 hours:  Temp:  [97.6 F (36.4 C)-98.7 F (37.1 C)] 98.7 F (37.1 C) (05/09 0444) Pulse Rate:  [59-81] 66 (05/09 0444) Resp:  [14-23] 16 (05/09 0444) BP: (120-153)/(72-88) 120/72 (05/09 0917) SpO2:  [96 %-100 %] 98 % (05/09 0444) Weight:  [94.6 kg-96 kg] 94.6 kg (05/08 1600)  Weight change: 0 kg Filed Weights   04/16/19 2225 04/17/19 1224 04/17/19 1600  Weight: 96 kg 96 kg 94.6 kg    Intake/Output: I/O last 3 completed shifts: In: 1 [I.V.:1] Out: 1512 [Other:1512]   Intake/Output this shift:  No intake/output data recorded.  Physical Exam: General: NAD,   Head: Normocephalic, atraumatic. Moist oral mucosal membranes  Eyes: Anicteric, PERRL  Neck: Supple, trachea midline  Lungs:  Clear to auscultation  Heart: Regular rate and rhythm  Abdomen:  Soft, nontender,   Extremities: no peripheral edema.  Neurologic: Nonfocal, moving all four extremities  Skin: No lesions  Access: Left AVG    Basic Metabolic Panel: Recent Labs  Lab 04/16/19 2243 04/17/19 0513 04/18/19 0440  NA 142 142 142  K 4.6 5.0 4.6  CL 99 103 101  CO2 28 26 30   GLUCOSE 104* 91 86  BUN 66* 69* 45*  CREATININE 13.88* 14.77* 11.30*  CALCIUM 8.1* 7.6* 7.8*  PHOS 7.0*  --   --     Liver Function Tests: Recent Labs  Lab 04/16/19 2243  AST 14*  ALT 9  ALKPHOS 49  BILITOT 0.4  PROT 7.1  ALBUMIN 3.7   No results for input(s): LIPASE, AMYLASE in the last 168 hours. No results for input(s): AMMONIA in the last 168 hours.  CBC: Recent Labs  Lab 04/16/19 2243 04/17/19 0513 04/17/19 1153 04/17/19 1805 04/18/19 0440  WBC 4.2 3.6*  --   --  3.5*  HGB 10.0* 8.7* 10.6* 9.9* 9.2*  HCT 33.1* 28.3* 34.9* 31.8* 30.3*  MCV 99.1 98.3  --   --  99.3  PLT 112* 98*  --   --  103*     Cardiac Enzymes: No results for input(s): CKTOTAL, CKMB, CKMBINDEX, TROPONINI in the last 168 hours.  BNP: Invalid input(s): POCBNP  CBG: No results for input(s): GLUCAP in the last 168 hours.  Microbiology: Results for orders placed or performed during the hospital encounter of 04/16/19  SARS Coronavirus 2 (CEPHEID - Performed in [place] hospital lab), Hosp Order     Status: None   Collection Time: 04/16/19 11:26 PM  Result Value Ref Range Status   SARS Coronavirus 2 NEGATIVE NEGATIVE Final    Comment: (NOTE) If result is NEGATIVE SARS-CoV-2 target nucleic acids are NOT DETECTED. The SARS-CoV-2 RNA is generally detectable in upper and lower  respiratory specimens during the acute phase of infection. The lowest  concentration of SARS-CoV-2 viral copies this assay can detect is 250  copies / mL. A negative result does not preclude SARS-CoV-2 infection  and should not be used as the sole basis for treatment or other  patient management decisions.  A negative result may occur with  improper specimen collection / handling, submission of specimen other  than nasopharyngeal swab, presence of viral mutation(s) within the  areas targeted by this assay, and inadequate number of viral copies  (<250 copies / mL). A negative result must be combined  with clinical  observations, patient history, and epidemiological information. If result is POSITIVE SARS-CoV-2 target nucleic acids are DETECTED. The SARS-CoV-2 RNA is generally detectable in upper and lower  respiratory specimens dur ing the acute phase of infection.  Positive  results are indicative of active infection with SARS-CoV-2.  Clinical  correlation with patient history and other diagnostic information is  necessary to determine patient infection status.  Positive results do  not rule out bacterial infection or co-infection with other viruses. If result is PRESUMPTIVE POSTIVE SARS-CoV-2 nucleic acids MAY BE PRESENT.   A  presumptive positive result was obtained on the submitted specimen  and confirmed on repeat testing.  While 2019 novel coronavirus  (SARS-CoV-2) nucleic acids may be present in the submitted sample  additional confirmatory testing may be necessary for epidemiological  and / or clinical management purposes  to differentiate between  SARS-CoV-2 and other Sarbecovirus currently known to infect humans.  If clinically indicated additional testing with an alternate test  methodology [phone]) is advised. The SARS-CoV-2 RNA is generally  detectable in upper and lower respiratory sp ecimens during the acute  phase of infection. The expected result is Negative. Fact Sheet for Patients:  StrictlyIdeas.no Fact Sheet for Healthcare Providers: BankingDealers.co.za This test is not yet approved or cleared by the [place] FDA and has been authorized for detection and/or diagnosis of SARS-CoV-2 by FDA under an Emergency Use Authorization (EUA).  This EUA will remain in effect (meaning this test can be used) for the duration of the COVID-19 declaration under Section 564(b)(1) of the Act, 21 U.S.C. section 360bbb-3(b)(1), unless the authorization is terminated or revoked sooner. Performed at [facility], [address] [place], [place] [address]     Coagulation Studies: Recent Labs    04/16/19 2243  LABPROT 13.8  INR 1.1    Urinalysis: No results for input(s): COLORURINE, LABSPEC, PHURINE, GLUCOSEU, HGBUR, BILIRUBINUR, KETONESUR, PROTEINUR, UROBILINOGEN, NITRITE, LEUKOCYTESUR in the last 72 hours.  Invalid input(s): APPERANCEUR    Imaging: No results found.   Medications:    . amLODipine  10 mg Oral Daily  . calcium acetate  2,668 mg Oral TID WC  . Chlorhexidine Gluconate Cloth  6 each Topical Q0600  . epoetin (EPOGEN/PROCRIT) injection  4,000 Units Intravenous Q M,W,F-HD  . metoprolol succinate  50 mg Oral Daily  .  pantoprazole (PROTONIX) IV  40 mg Intravenous Q12H   acetaminophen **OR** acetaminophen, traZODone  Assessment/ Plan:  [name] is a 58 y.o. black male with end stage renal disease on hemodialysis, hypertension, substance abuse, congestive heart failure, obstructive sleep apnea, who is admitted to [facility] on 04/16/2019 for Rectal bleeding [K62.5] End stage renal disease on dialysis ([place]) [N18.6, Z99.2]  [facility] Left AVG (Hero) 96kg  1. End Stage Renal disease: hemodialysis treatment yesterday. Tolerated treatment well.   2. Hypertension:  - metoprolol and amlodipine  3. Secondary Hyperparathyroidism: with hyperphosphatemia. On Parsabiv as outpatient - Calcium acetate with meals.   4. Anemia of chronic kidney disease: hemoglobin 9.2. With thrombocytopenia. With GI bleed  - Appreciate GI input: no procedure since patient is on clopidogrel.  - EPO with HD treatment    LOS: 2 Kasandra Fehr 5/9/202010:50 AM

## 2019-04-18 NOTE — Discharge Summary (Signed)
[facility] NAME: [name] [name]    MR#:  [id number]  DATE OF BIRTH:  [birth year]  DATE OF ADMISSION:  04/16/2019 ADMITTING PHYSICIAN: Christel Mormon, MD  DATE OF DISCHARGE: 04/18/2019  9:58 AM  PRIMARY CARE PHYSICIAN: Patient, No Pcp Per    ADMISSION DIAGNOSIS:  Rectal bleeding [K62.5] End stage renal disease on dialysis ([place]) [N18.6, Z99.2]  DISCHARGE DIAGNOSIS:  Active Problems:   Rectal bleeding   SECONDARY DIAGNOSIS:   Past Medical History:  Diagnosis Date  . Anemia of chronic disease    BL Hgb ~10  . CHF (congestive heart failure) ([place])    pt denies 04/01/14  . Diverticulitis   . End-stage renal disease on hemodialysis ([place])    HD m/w/f - right arm graft  . History of blood transfusion   . Hypertension   . Pneumonia    hx of  . Pneumonia   . Polysubstance abuse ([place])   . Renal insufficiency   . Secondary hyperparathyroidism ([place])   . Sleep disturbance   . Tobacco abuse     HOSPITAL COURSE:  58 year old male with known history of diverticulosis and end-stage renal disease on hemodialysis who presented to the emergency room due to rectal bleeding.  1  Rectal bleeding with acute blood loss anemia: Patient's rectal bleeding has subsided.  This was due to diverticular bleed.  Hemoglobin has remained stable.  Patient was eval by GI.  Patient did not want invasive work-up including colonoscopy. Patient was instructed to avoid nuts and seeds. 2.  End-stage renal disease on hemodialysis: Patient will continue on his dialysis schedule.  I spoke with Dr. Juleen China.  He recommends that the patient continue on Plavix because patient has a hero graft.  Patient will follow-up with Dr. Juleen China at the end of the week for repeat hemoglobin.  3.  Essential hypertension: Patient will resume metoprolol and Norvasc  4. Tobacco dependence: Patient is encouraged to quit smoking and willing to attempt to quit was assessed. Patient NOT  highly  motivated.Counseling was provided for 4 minutes.  DISCHARGE CONDITIONS AND DIET:  Stable Renal diet  CONSULTS OBTAINED:  Treatment Team:  Virgel Manifold, MD Lavonia Dana, MD Jonathon Bellows, MD  DRUG ALLERGIES:   Allergies  Allergen Reactions  . Other     Other reaction(s): Other (See Comments) blister  . Tape Other (See Comments)    Plastic Tape Causes blister    DISCHARGE MEDICATIONS:   Allergies as of 04/18/2019      Reactions   Other    Other reaction(s): Other (See Comments) blister   Tape Other (See Comments)   Plastic Tape Causes blister      Medication List    STOP taking these medications   ondansetron 4 MG disintegrating tablet Commonly known as:  Zofran ODT     TAKE these medications   acetaminophen 500 MG tablet Commonly known as:  TYLENOL Take 1,000 mg by mouth every 8 (eight) hours as needed for moderate pain.   amLODipine 10 MG tablet Commonly known as:  NORVASC Take 10 mg by mouth every evening.   aspirin EC 81 MG tablet Take 81 mg by mouth daily.   calcium acetate 667 MG capsule Commonly known as:  PHOSLO Take 2,668 mg by mouth 3 (three) times daily with meals.   cinacalcet 90 MG tablet Commonly known as:  SENSIPAR Take 90 mg by mouth daily.   clopidogrel 75 MG tablet Commonly known  as:  PLAVIX Take 75 mg by mouth daily.   diphenhydrAMINE 25 mg capsule Commonly known as:  BENADRYL Take 25 mg by mouth every 6 (six) hours as needed.   metoprolol succinate 50 MG 24 hr tablet Commonly known as:  TOPROL-XL Take 50 mg by mouth daily. Take with or immediately following a meal.   promethazine 12.5 MG tablet Commonly known as:  PHENERGAN TAKE 1 TAB BY MOUTH ONCE A DAY AS NEEDED FOR VOMITING.         Today   CHIEF COMPLAINT:  Wants to go home no rectal bleeding 2 days   VITAL SIGNS:  Blood pressure 120/72, pulse 66, temperature 98.7 F (37.1 C), temperature source Oral, resp. rate 16, height 6\' 1"  (1.854 m), weight  94.6 kg, SpO2 98 %.   REVIEW OF SYSTEMS:  Review of Systems  Constitutional: Negative.  Negative for chills, fever and malaise/fatigue.  HENT: Negative.  Negative for ear discharge, ear pain, hearing loss, nosebleeds and sore throat.   Eyes: Negative.  Negative for blurred vision and pain.  Respiratory: Negative.  Negative for cough, hemoptysis, shortness of breath and wheezing.   Cardiovascular: Negative.  Negative for chest pain, palpitations and leg swelling.  Gastrointestinal: Negative.  Negative for abdominal pain, blood in stool, diarrhea, nausea and vomiting.  Genitourinary: Negative.  Negative for dysuria.  Musculoskeletal: Negative.  Negative for back pain.  Skin: Negative.   Neurological: Negative for dizziness, tremors, speech change, focal weakness, seizures and headaches.  Endo/Heme/Allergies: Negative.  Does not bruise/bleed easily.  Psychiatric/Behavioral: Negative.  Negative for depression, hallucinations and suicidal ideas.     PHYSICAL EXAMINATION:  GENERAL:  58 y.o.-year-old patient lying in the bed with no acute distress.  NECK:  Supple, no jugular venous distention. No thyroid enlargement, no tenderness.  LUNGS: Normal breath sounds bilaterally, no wheezing, rales,rhonchi  No use of accessory muscles of respiration.  CARDIOVASCULAR: S1, S2 normal. No murmurs, rubs, or gallops.  ABDOMEN: Soft, non-tender, non-distended. Bowel sounds present. No organomegaly or mass.  EXTREMITIES: No pedal edema, cyanosis, or clubbing.  PSYCHIATRIC: The patient is alert and oriented x 3.  SKIN: No obvious rash, lesion, or ulcer.   DATA REVIEW:   CBC Recent Labs  Lab 04/18/19 0440  WBC 3.5*  HGB 9.2*  HCT 30.3*  PLT 103*    Chemistries  Recent Labs  Lab 04/16/19 2243  04/18/19 0440  NA 142   < > 142  K 4.6   < > 4.6  CL 99   < > 101  CO2 28   < > 30  GLUCOSE 104*   < > 86  BUN 66*   < > 45*  CREATININE 13.88*   < > 11.30*  CALCIUM 8.1*   < > 7.8*  AST 14*  --    --   ALT 9  --   --   ALKPHOS 49  --   --   BILITOT 0.4  --   --    < > = values in this interval not displayed.    Cardiac Enzymes No results for input(s): TROPONINI in the last 168 hours.  Microbiology Results  @MICRORSLT48 @  RADIOLOGY:  No results found.    Allergies as of 04/18/2019      Reactions   Other    Other reaction(s): Other (See Comments) blister   Tape Other (See Comments)   Plastic Tape Causes blister      Medication List    STOP taking these  medications   ondansetron 4 MG disintegrating tablet Commonly known as:  Zofran ODT     TAKE these medications   acetaminophen 500 MG tablet Commonly known as:  TYLENOL Take 1,000 mg by mouth every 8 (eight) hours as needed for moderate pain.   amLODipine 10 MG tablet Commonly known as:  NORVASC Take 10 mg by mouth every evening.   aspirin EC 81 MG tablet Take 81 mg by mouth daily.   calcium acetate 667 MG capsule Commonly known as:  PHOSLO Take 2,668 mg by mouth 3 (three) times daily with meals.   cinacalcet 90 MG tablet Commonly known as:  SENSIPAR Take 90 mg by mouth daily.   clopidogrel 75 MG tablet Commonly known as:  PLAVIX Take 75 mg by mouth daily.   diphenhydrAMINE 25 mg capsule Commonly known as:  BENADRYL Take 25 mg by mouth every 6 (six) hours as needed.   metoprolol succinate 50 MG 24 hr tablet Commonly known as:  TOPROL-XL Take 50 mg by mouth daily. Take with or immediately following a meal.   promethazine 12.5 MG tablet Commonly known as:  PHENERGAN TAKE 1 TAB BY MOUTH ONCE A DAY AS NEEDED FOR VOMITING.          Management plans discussed with the patient and he is in agreement. Stable for discharge home  Patient should follow up with nephrology  CODE STATUS:     Code Status Orders  (From admission, onward)         Start     Ordered   04/16/19 2352  Full code  Continuous     04/16/19 2353        Code Status History    Date Active Date Inactive Code Status  Order ID Comments User Context   12/05/2017 0652 12/07/2017 1715 Full Code [id number]  Jules Husbands, MD Inpatient   04/24/2015 2002 04/26/2015 1709 Full Code [id number]  Idelle Crouch, MD Inpatient   04/16/2015 1541 04/18/2015 1307 Full Code [id number]  Gladstone Lighter, MD Inpatient   12/18/2012 1911 12/30/2012 1601 Full Code [id number]  Ruthine Dose Ngox, RN Inpatient   12/11/2012 2306 12/18/2012 1911 Full Code [id number]  Annamarie Dawley, DO Inpatient    Advance Directive Documentation     Most Recent Value  Type of Advance Directive  Living will  Pre-existing out of facility DNR order (yellow form or pink MOST form)  -  "MOST" Form in Place?  -      TOTAL TIME TAKING CARE OF THIS PATIENT: 38 minutes.    Note: This dictation was prepared with Dragon dictation along with smaller phrase technology. Any transcriptional errors that result from this process are unintentional.  Bettey Costa M.D on 04/18/2019 at 11:44 AM  Between 7am to 6pm - Pager - [phone] After 6pm go to [email or web address] - password [email or web address] [facility]  Office  [phone]  CC: Primary care physician; Patient, No Pcp Per

## 2019-11-13 ENCOUNTER — Other Ambulatory Visit: Payer: Self-pay

## 2019-11-13 ENCOUNTER — Emergency Department
Admission: EM | Admit: 2019-11-13 | Discharge: 2019-11-13 | Disposition: A | Discharge status: Home / Self Care | Payer: Medicare Other | Attending: Emergency Medicine | Admitting: Emergency Medicine

## 2019-11-13 ENCOUNTER — Emergency Department: Payer: Medicare Other

## 2019-11-13 DIAGNOSIS — Z992 Dependence on renal dialysis: Secondary | ICD-10-CM | POA: Insufficient documentation

## 2019-11-13 DIAGNOSIS — F17210 Nicotine dependence, cigarettes, uncomplicated: Secondary | ICD-10-CM | POA: Insufficient documentation

## 2019-11-13 DIAGNOSIS — N186 End stage renal disease: Secondary | ICD-10-CM | POA: Diagnosis not present

## 2019-11-13 DIAGNOSIS — Z79899 Other long term (current) drug therapy: Secondary | ICD-10-CM | POA: Insufficient documentation

## 2019-11-13 DIAGNOSIS — R1030 Lower abdominal pain, unspecified: Secondary | ICD-10-CM | POA: Diagnosis present

## 2019-11-13 DIAGNOSIS — Z7902 Long term (current) use of antithrombotics/antiplatelets: Secondary | ICD-10-CM | POA: Diagnosis not present

## 2019-11-13 DIAGNOSIS — I120 Hypertensive chronic kidney disease with stage 5 chronic kidney disease or end stage renal disease: Secondary | ICD-10-CM | POA: Insufficient documentation

## 2019-11-13 DIAGNOSIS — K5792 Diverticulitis of intestine, part unspecified, without perforation or abscess without bleeding: Secondary | ICD-10-CM

## 2019-11-13 DIAGNOSIS — K5732 Diverticulitis of large intestine without perforation or abscess without bleeding: Secondary | ICD-10-CM | POA: Insufficient documentation

## 2019-11-13 LAB — CBC
HCT: 37.7 % — ABNORMAL LOW (ref 39.0–52.0)
Hemoglobin: 11.8 g/dL — ABNORMAL LOW (ref 13.0–17.0)
MCH: 29.8 pg (ref 26.0–34.0)
MCHC: 31.3 g/dL (ref 30.0–36.0)
MCV: 95.2 fL (ref 80.0–100.0)
Platelets: 106 10*3/uL — ABNORMAL LOW (ref 150–400)
RBC: 3.96 MIL/uL — ABNORMAL LOW (ref 4.22–5.81)
RDW: 14.4 % (ref 11.5–15.5)
WBC: 5.5 10*3/uL (ref 4.0–10.5)
nRBC: 0 % (ref 0.0–0.2)

## 2019-11-13 LAB — COMPREHENSIVE METABOLIC PANEL
ALT: 10 U/L (ref 0–44)
AST: 12 U/L — ABNORMAL LOW (ref 15–41)
Albumin: 4.4 g/dL (ref 3.5–5.0)
Alkaline Phosphatase: 57 U/L (ref 38–126)
Anion gap: 13 (ref 5–15)
BUN: 38 mg/dL — ABNORMAL HIGH (ref 6–20)
CO2: 29 mmol/L (ref 22–32)
Calcium: 8.9 mg/dL (ref 8.9–10.3)
Chloride: 99 mmol/L (ref 98–111)
Creatinine, Ser: 9.95 mg/dL — ABNORMAL HIGH (ref 0.61–1.24)
GFR calc Af Amer: 6 mL/min — ABNORMAL LOW (ref >60)
GFR calc non Af Amer: 5 mL/min — ABNORMAL LOW (ref >60)
Glucose, Bld: 113 mg/dL — ABNORMAL HIGH (ref 70–99)
Potassium: 4.7 mmol/L (ref 3.5–5.1)
Sodium: 141 mmol/L (ref 135–145)
Total Bilirubin: 0.7 mg/dL (ref 0.3–1.2)
Total Protein: 8.2 g/dL — ABNORMAL HIGH (ref 6.5–8.1)

## 2019-11-13 LAB — LIPASE, BLOOD: Lipase: 125 U/L — ABNORMAL HIGH (ref 11–51)

## 2019-11-13 MED ORDER — ONDANSETRON 4 MG PO TBDP: 4.0000 mg | ORAL_TABLET | Freq: Three times a day (TID) | ORAL | 0 refills | Status: DC | PRN

## 2019-11-13 MED ORDER — AMOXICILLIN-POT CLAVULANATE 875-125 MG PO TABS
1.0000 | ORAL_TABLET | Freq: Once | ORAL | Status: AC
  Administered 2019-11-13: 1 via ORAL
  Filled 2019-11-13: qty 1

## 2019-11-13 MED ORDER — SODIUM CHLORIDE 0.9 % IV BOLUS
250.0000 mL | Freq: Once | INTRAVENOUS | Status: AC
  Administered 2019-11-13: 22:00:00 250 mL via INTRAVENOUS

## 2019-11-13 MED ORDER — AMOXICILLIN-POT CLAVULANATE 500-125 MG PO TABS: 1.0000 | ORAL_TABLET | Freq: Three times a day (TID) | ORAL | 0 refills | Status: DC

## 2019-11-13 MED ORDER — ONDANSETRON HCL 4 MG/2ML IJ SOLN
4.0000 mg | Freq: Once | INTRAMUSCULAR | Status: AC
  Administered 2019-11-13: 4 mg via INTRAVENOUS
  Filled 2019-11-13: qty 2

## 2019-11-13 NOTE — ED Provider Notes (Signed)
[facility] Emergency Department Provider Note  Time seen: 9:17 PM  I have reviewed the triage vital signs and the nursing notes.   HISTORY  Chief Complaint Emesis   HPI [name] [name] is a 58 y.o. male with a past medical history of end-stage renal disease on hemodialysis Monday/Wednesday/Friday last received dialysis this morning, hypertension, history of polysubstance abuse, presents to the emergency department for abdominal pain nausea vomiting since this morning.  Patient states pain is mostly in the lower abdomen.  States persistent nausea with several episodes of vomiting today.  Was able to go to dialysis and get a full session today.  Denies any fever or shortness of breath.  No diarrhea.   Past Medical History:  Diagnosis Date  . Anemia of chronic disease    BL Hgb ~10  . CHF (congestive heart failure) ([place])    pt denies 04/01/14  . Diverticulitis   . End-stage renal disease on hemodialysis ([place])    HD m/w/f - right arm graft  . History of blood transfusion   . Hypertension   . Pneumonia    hx of  . Pneumonia   . Polysubstance abuse ([place])   . Renal insufficiency   . Secondary hyperparathyroidism ([place])   . Sleep disturbance   . Tobacco abuse     Patient Active Problem List   Diagnosis Date Noted  . Rectal bleeding 04/16/2019  . Diverticulitis large intestine 12/05/2017  . Diverticulitis of colon 04/24/2015  . Community acquired pneumonia 04/18/2015  . Sepsis ([place]) 04/16/2015  . Shortness of breath 07/07/2013  . Acidosis, metabolic, with respiratory acidosis 04/23/2013  . Acute post-thoracotomy pain 04/23/2013  . Hemorrhagic shock ([place]) 04/23/2013  . S/P sympathectomy 04/23/2013  . 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 Persistent air leak 03/18/2013  . Pneumothorax, right 03/18/2013  . S/P thoracotomy 03/04/2013  . 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 ([place]) 01/17/2013  . Bacteremia 01/06/2013  .  Benign neoplasm of colon 12/16/2012  . Diverticulosis of colon (without mention of hemorrhage) 12/16/2012  . ESCRF (end stage chronic renal failure) ([place]) 12/12/2012  . H/O: hypertension 12/12/2012  . Pleural effusion, right 12/12/2012  . History of prolonged Q-T interval on ECG 12/12/2012  . Hyperkalemia 12/11/2012  . Acute blood loss anemia 12/11/2012  . Sinus tachycardia, resolved with PRBC resuscitation 12/11/2012  . Lower GI bleed 12/11/2012  . End-stage renal disease on hemodialysis ([place])   . Secondary hyperparathyroidism, renal ([place])   . Hypotension   . Polysubstance abuse ([place])   . Anemia of chronic disease   . Tobacco abuse     Past Surgical History:  Procedure Laterality Date  . APPENDECTOMY    . CHEST TUBE INSERTION  02/2013  . COLONOSCOPY  12/16/2012   Procedure: COLONOSCOPY;  Surgeon: Milus Banister, MD;  Location: [place];  Service: Endoscopy;  Laterality: N/A;  . PLEURAL EFFUSION DRAINAGE  12/18/2012   Procedure: DRAINAGE OF PLEURAL EFFUSION;  Surgeon: Melrose Nakayama, MD;  Location: [place];  Service: Thoracic;  Laterality: Right;  . surgery for horseshoe kidney    . TALC PLEURODESIS Right 02/05/2013   Procedure: Pietro Cassis;  Surgeon: Melrose Nakayama, MD;  Location: [place];  Service: Thoracic;  Laterality: Right;  . TEE WITHOUT CARDIOVERSION N/A 01/16/2013   Procedure: TRANSESOPHAGEAL ECHOCARDIOGRAM (TEE);  Surgeon: Sanda Klein, MD;  Location: [place];  Service: Cardiovascular;  Laterality: N/A;  . VIDEO ASSISTED THORACOSCOPY  12/18/2012   Procedure:  VIDEO ASSISTED THORACOSCOPY;  Surgeon: Melrose Nakayama, MD;  Location[place];  Service: Thoracic;  Laterality: Right;  pleural peel  . VIDEO ASSISTED THORACOSCOPY (VATS)/THOROCOTOMY Right 02/19/2013   Procedure: VIDEO ASSISTED THORACOSCOPY (VATS)/THOROCOTOMY;  Surgeon: Melrose Nakayama, MD;  Location: [place];  Service: Thoracic;  Laterality: Right;  Right Video Assisted Thoracoscopy, Right  Thoracotomy, Decortication,   . VIDEO BRONCHOSCOPY WITH INSERTION OF INTERBRONCHIAL VALVE (IBV)  01/09/2013   Procedure: VIDEO BRONCHOSCOPY WITH INSERTION OF INTERBRONCHIAL VALVE (IBV);  Surgeon: Melrose Nakayama, MD;  Location: [place];  Service: Thoracic;  Laterality: N/A;  placement of 2 Intrabronchial valves in right lower lobe  . VIDEO BRONCHOSCOPY WITH INSERTION OF INTERBRONCHIAL VALVE (IBV) Right 02/19/2013   Procedure: VIDEO BRONCHOSCOPY WITH INSERTION OF INTERBRONCHIAL VALVE (IBV);  Surgeon: Melrose Nakayama, MD;  Location: [place];  Service: Thoracic;  Laterality: Right;  removal of two interbronchial valves right chest    Prior to Admission medications   Medication Sig Start Date End Date Taking? Authorizing Provider  acetaminophen (TYLENOL) 500 MG tablet Take 1,000 mg by mouth every 8 (eight) hours as needed for moderate pain.    [provider]  amLODipine (NORVASC) 10 MG tablet Take 10 mg by mouth every evening. 02/19/19   [provider]  calcium acetate (PHOSLO) 667 MG capsule Take 2,668 mg by mouth 3 (three) times daily with meals.     [provider]  cinacalcet (SENSIPAR) 90 MG tablet Take 90 mg by mouth daily.    [provider]  clopidogrel (PLAVIX) 75 MG tablet Take 75 mg by mouth daily. 03/29/19   [provider]  diphenhydrAMINE (BENADRYL) 25 mg capsule Take 25 mg by mouth every 6 (six) hours as needed.     [provider]  metoprolol succinate (TOPROL-XL) 50 MG 24 hr tablet Take 50 mg by mouth daily. Take with or immediately following a meal.    [provider]  promethazine (PHENERGAN) 12.5 MG tablet TAKE 1 TAB BY MOUTH ONCE A DAY AS NEEDED FOR VOMITING. 04/10/19   [provider]    Allergies  Allergen Reactions  . Other     Other reaction(s): Other (See Comments) blister  . Tape Other (See Comments)    Plastic Tape Causes blister    Family History  Problem Relation Age of Onset  . Thyroid  disease Mother   . Renal Disease Maternal Grandmother   . Diabetes Maternal Grandmother   . Hypertension Maternal Aunt   . Hypertension Maternal Uncle     Social History Social History   Tobacco Use  . Smoking status: Current Every Day Smoker    Packs/day: 6.00    Years: 20.00    Pack years: 120.00    Types: Cigarettes  . Smokeless tobacco: Current User  Substance Use Topics  . Alcohol use: Yes    Alcohol/week: 3.0 standard drinks    Types: 3 Cans of beer per week    Comment: occasional  . Drug use: Yes    Types: Marijuana    Comment: remote cocaine abuse, ongoing marijuana use.    Review of Systems Constitutional: Negative for fever. Cardiovascular: Negative for chest pain. Respiratory: Negative for shortness of breath. Gastrointestinal: Positive for abdominal pain/cramping, described as moderate and fairly diffuse.  Positive for nausea vomiting. Musculoskeletal: Negative for musculoskeletal complaints Neurological: Negative for headache All other ROS negative  ____________________________________________   PHYSICAL EXAM:  VITAL SIGNS: ED Triage Vitals [11/13/19 1717]  Enc Vitals Group  BP (!) 171/92     Pulse Rate 71     Resp 17     Temp 98.5 F (36.9 C)     Temp Source Oral     SpO2 96 %     Weight 207 lb 3.7 oz (94 kg)     Height 6' (1.829 m)     Head Circumference      Peak Flow      Pain Score 6     Pain Loc      Pain Edu?      Excl. in [place]?    Constitutional: Alert and oriented. Well appearing and in no distress. Eyes: Normal exam ENT      Head: Normocephalic and atraumatic.      Mouth/Throat: Dry appearing mucous membranes Cardiovascular: Normal rate, regular rhythm. No murmur Respiratory: Normal respiratory effort without tachypnea nor retractions. Breath sounds are clear  Gastrointestinal: Soft, overall benign abdominal exam no tenderness identified, no rebound guarding or distention. Musculoskeletal: Nontender with normal range of  motion in all extremities.  Neurologic:  Normal speech and language. No gross focal neurologic deficits  Skin:  Skin is warm, dry and intact.  Psychiatric: Mood and affect are normal.   ____________________________________________    EKG  EKG viewed and interpreted by myself shows a normal sinus rhythm 82 bpm with a narrow QRS, normal axis, largely normal intervals, significant lateral interference but no obvious ST elevation.  ____________________________________________    RADIOLOGY  CT scan shows mild sigmoid diverticulitis.  ____________________________________________   INITIAL IMPRESSION / ASSESSMENT AND PLAN / ED COURSE  Pertinent labs & imaging results that were available during my care of the patient were reviewed by me and considered in my medical decision making (see chart for details).   Patient presents to the emergency department for nausea vomiting abdominal pain.  Patient does have an elevated lipase 125, normal LFTs.  Patient does state he drinks alcohol on occasion, but denies daily use.  Patient has had mild lipase elevations in the past as well.  We will treat with Zofran dose a very small amount of IV fluids 250 cc.  We will obtain CT imaging of abdomen to rule out intra-abdominal pathology.  Patient agreeable to plan of care.  Patient did receive dialysis earlier today.  CT scan shows mild sigmoid diverticulitis likely the cause of the patient's lower abdominal discomfort.  Patient is on hemodialysis we will place on Augmentin once daily to be dosed after dialysis (on dialysis days) for the next 10 days.  Patient agreeable to plan of care.  TARRON KROLAK was evaluated in Emergency Department on 11/13/2019 for the symptoms described in the history of present illness. He was evaluated in the context of the global COVID-19 pandemic, which necessitated consideration that the patient might be at risk for infection with the SARS-CoV-2 virus that causes COVID-19.  Institutional protocols and algorithms that pertain to the evaluation of patients at risk for COVID-19 are in a state of rapid change based on information released by regulatory bodies including the CDC and federal and state organizations. These policies and algorithms were followed during the patient's care in the ED.  ____________________________________________   FINAL CLINICAL IMPRESSION(S) / ED DIAGNOSES  Nausea vomiting Diverticulitis   Harvest Dark, MD 11/13/19 2224

## 2019-11-13 NOTE — ED Notes (Signed)
Pt friend called stating that she will be pts ride. Mrs. [name] [phone]

## 2019-11-13 NOTE — Discharge Instructions (Signed)
Please take antibiotic as prescribed each day for the next 10 days.  On days of dialysis please take your antibiotic after you have completed dialysis.  Please take your nausea medication as needed.  Return to the emergency department if you have worsening pain, fever, or any other symptom personally concerning to yourself.

## 2019-11-13 NOTE — ED Provider Notes (Signed)
[facility] Emergency Department Provider Note  ____________________________________________  Time seen: Approximately 5:22 PM  The following is a medical screening exam note. It is intended that the patient await an ER room assignment for detailed exam, diagnosis, and disposition.  I have reviewed the triage vital signs.    HISTORY  Chief Complaint Emesis   HPI [name] [name] is a 58 y.o. male that presents to the emergency department for evaluation of low abdominal pain, vomiting and chills that started this morning. Patient is concerned that this could be his diverticulitis. He does not make urine. He did complete dialysis today. He would like a blanket because he feels very cold. No SOB, CP, diarrhea, constipation.  Past Medical History:  Diagnosis Date  . Anemia of chronic disease    BL Hgb ~10  . CHF (congestive heart failure) ([place])    pt denies 04/01/14  . Diverticulitis   . End-stage renal disease on hemodialysis ([place])    HD m/w/f - right arm graft  . History of blood transfusion   . Hypertension   . Pneumonia    hx of  . Pneumonia   . Polysubstance abuse ([place])   . Renal insufficiency   . Secondary hyperparathyroidism ([place])   . Sleep disturbance   . Tobacco abuse     Patient Active Problem List   Diagnosis Date Noted  . Rectal bleeding 04/16/2019  . Diverticulitis large intestine 12/05/2017  . Diverticulitis of colon 04/24/2015  . Community acquired pneumonia 04/18/2015  . Sepsis ([place]) 04/16/2015  . Shortness of breath 07/07/2013  . Acidosis, metabolic, with respiratory acidosis 04/23/2013  . Acute post-thoracotomy pain 04/23/2013  . Hemorrhagic shock ([place]) 04/23/2013  . S/P sympathectomy 04/23/2013  . 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 Persistent air leak 03/18/2013  . Pneumothorax, right 03/18/2013  . S/P thoracotomy 03/04/2013  . 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 ([place]) 01/17/2013  .  Bacteremia 01/06/2013  . Benign neoplasm of colon 12/16/2012  . Diverticulosis of colon (without mention of hemorrhage) 12/16/2012  . ESCRF (end stage chronic renal failure) ([place]) 12/12/2012  . H/O: hypertension 12/12/2012  . Pleural effusion, right 12/12/2012  . History of prolonged Q-T interval on ECG 12/12/2012  . Hyperkalemia 12/11/2012  . Acute blood loss anemia 12/11/2012  . Sinus tachycardia, resolved with PRBC resuscitation 12/11/2012  . Lower GI bleed 12/11/2012  . End-stage renal disease on hemodialysis ([place])   . Secondary hyperparathyroidism, renal ([place])   . Hypotension   . Polysubstance abuse ([place])   . Anemia of chronic disease   . Tobacco abuse     Past Surgical History:  Procedure Laterality Date  . APPENDECTOMY    . CHEST TUBE INSERTION  02/2013  . COLONOSCOPY  12/16/2012   Procedure: COLONOSCOPY;  Surgeon: Milus Banister, MD;  Location: [place];  Service: Endoscopy;  Laterality: N/A;  . PLEURAL EFFUSION DRAINAGE  12/18/2012   Procedure: DRAINAGE OF PLEURAL EFFUSION;  Surgeon: Melrose Nakayama, MD;  Location: [place];  Service: Thoracic;  Laterality: Right;  . surgery for horseshoe kidney    . TALC PLEURODESIS Right 02/05/2013   Procedure: Pietro Cassis;  Surgeon: Melrose Nakayama, MD;  Location: [place];  Service: Thoracic;  Laterality: Right;  . TEE WITHOUT CARDIOVERSION N/A 01/16/2013   Procedure: TRANSESOPHAGEAL ECHOCARDIOGRAM (TEE);  Surgeon: Sanda Klein, MD;  Location: [place];  Service: Cardiovascular;  Laterality: N/A;  . VIDEO ASSISTED THORACOSCOPY  12/18/2012   Procedure: VIDEO  ASSISTED THORACOSCOPY;  Surgeon: Melrose Nakayama, MD;  Location: [place];  Service: Thoracic;  Laterality: Right;  pleural peel  . VIDEO ASSISTED THORACOSCOPY (VATS)/THOROCOTOMY Right 02/19/2013   Procedure: VIDEO ASSISTED THORACOSCOPY (VATS)/THOROCOTOMY;  Surgeon: Melrose Nakayama, MD;  Location: [place];  Service: Thoracic;  Laterality: Right;  Right Video Assisted  Thoracoscopy, Right Thoracotomy, Decortication,   . VIDEO BRONCHOSCOPY WITH INSERTION OF INTERBRONCHIAL VALVE (IBV)  01/09/2013   Procedure: VIDEO BRONCHOSCOPY WITH INSERTION OF INTERBRONCHIAL VALVE (IBV);  Surgeon: Melrose Nakayama, MD;  Location: [place];  Service: Thoracic;  Laterality: N/A;  placement of 2 Intrabronchial valves in right lower lobe  . VIDEO BRONCHOSCOPY WITH INSERTION OF INTERBRONCHIAL VALVE (IBV) Right 02/19/2013   Procedure: VIDEO BRONCHOSCOPY WITH INSERTION OF INTERBRONCHIAL VALVE (IBV);  Surgeon: Melrose Nakayama, MD;  Location: [place];  Service: Thoracic;  Laterality: Right;  removal of two interbronchial valves right chest    Prior to Admission medications   Medication Sig Start Date End Date Taking? Authorizing Provider  acetaminophen (TYLENOL) 500 MG tablet Take 1,000 mg by mouth every 8 (eight) hours as needed for moderate pain.    [provider]  amLODipine (NORVASC) 10 MG tablet Take 10 mg by mouth every evening. 02/19/19   [provider]  calcium acetate (PHOSLO) 667 MG capsule Take 2,668 mg by mouth 3 (three) times daily with meals.     [provider]  cinacalcet (SENSIPAR) 90 MG tablet Take 90 mg by mouth daily.    [provider]  clopidogrel (PLAVIX) 75 MG tablet Take 75 mg by mouth daily. 03/29/19   [provider]  diphenhydrAMINE (BENADRYL) 25 mg capsule Take 25 mg by mouth every 6 (six) hours as needed.     [provider]  metoprolol succinate (TOPROL-XL) 50 MG 24 hr tablet Take 50 mg by mouth daily. Take with or immediately following a meal.    [provider]  promethazine (PHENERGAN) 12.5 MG tablet TAKE 1 TAB BY MOUTH ONCE A DAY AS NEEDED FOR VOMITING. 04/10/19   [provider]    Allergies Other and Tape  Family History  Problem Relation Age of Onset  . Thyroid disease Mother   . Renal Disease Maternal Grandmother   . Diabetes Maternal Grandmother   . Hypertension  Maternal Aunt   . Hypertension Maternal Uncle     Social History Social History   Tobacco Use  . Smoking status: Current Every Day Smoker    Packs/day: 6.00    Years: 20.00    Pack years: 120.00    Types: Cigarettes  . Smokeless tobacco: Current User  Substance Use Topics  . Alcohol use: Yes    Alcohol/week: 3.0 standard drinks    Types: 3 Cans of beer per week    Comment: occasional  . Drug use: Yes    Types: Marijuana    Comment: remote cocaine abuse, ongoing marijuana use.    Review of Systems Constitutional: Negative for fever. Positive for chills. Respiratory: Negative for SOB Gastrointestinal: Positive for abdominal pain.  Positive for vomiting. Skin: Negative for rash/lesion/wound. Neurological: Negative for headaches, focal weakness or numbness.  ____________________________________________   PHYSICAL EXAM:  VITAL SIGNS:  Vitals:   11/13/19 1717  BP: (!) 171/92  Pulse: 71  Resp: 17  Temp: 98.5 F (36.9 C)  SpO2: 96%    Constitutional: Alert and oriented. Shivering Head: Atraumatic. Nose: No congestion/rhinnorhea. Mouth/Throat: Mucous membranes are moist. Neck: No stridor.  Cardiovascular: Good peripheral  circulation. Respiratory: Normal respiratory effort. Musculoskeletal: No restriction Neurologic:  Normal speech and language. No gross focal neurologic deficits are appreciated. Speech is normal. No gait instability. Skin:  Skin is warm, dry and intact. No rash noted. Psychiatric: Mood and affect are normal. Speech and behavior are normal.  ____________________________________________   LABS (all labs ordered are listed, but only abnormal results are displayed)  Labs Reviewed  LIPASE, BLOOD  COMPREHENSIVE METABOLIC PANEL  CBC  URINALYSIS, COMPLETE (UACMP) WITH MICROSCOPIC   ____________________________________________    INITIAL CLINICAL IMPRESSION(S)    Patient was offered a blanket.  Labs and urine were ordered for further  evaluation.   Laban Emperor, PA-C 11/13/19 2227    Delman Kitten, MD 11/13/19 2325

## 2019-11-13 NOTE — ED Triage Notes (Signed)
Pt comes from home with vomiting after dialysis today. Pt also shaking and asking for a blanket. Pt c/o lower abdominal pain.

## 2019-11-13 NOTE — ED Notes (Signed)
Patient to stat desk complaining of cramps. Patient updated on wait time. Patient verbalizes understanding.

## 2019-12-05 ENCOUNTER — Emergency Department
Admission: EM | Admit: 2019-12-05 | Discharge: 2019-12-05 | Disposition: A | Discharge status: Home / Self Care | Payer: Medicare Other | Attending: Emergency Medicine | Admitting: Emergency Medicine

## 2019-12-05 ENCOUNTER — Other Ambulatory Visit: Payer: Self-pay

## 2019-12-05 ENCOUNTER — Encounter: Payer: Self-pay | Admitting: Emergency Medicine

## 2019-12-05 DIAGNOSIS — N186 End stage renal disease: Secondary | ICD-10-CM | POA: Insufficient documentation

## 2019-12-05 DIAGNOSIS — T82868A Thrombosis of vascular prosthetic devices, implants and grafts, initial encounter: Secondary | ICD-10-CM

## 2019-12-05 DIAGNOSIS — I132 Hypertensive heart and chronic kidney disease with heart failure and with stage 5 chronic kidney disease, or end stage renal disease: Secondary | ICD-10-CM | POA: Diagnosis not present

## 2019-12-05 DIAGNOSIS — Y828 Other medical devices associated with adverse incidents: Secondary | ICD-10-CM | POA: Diagnosis not present

## 2019-12-05 DIAGNOSIS — T8249XA Other complication of vascular dialysis catheter, initial encounter: Secondary | ICD-10-CM | POA: Insufficient documentation

## 2019-12-05 DIAGNOSIS — I509 Heart failure, unspecified: Secondary | ICD-10-CM | POA: Insufficient documentation

## 2019-12-05 DIAGNOSIS — Z7902 Long term (current) use of antithrombotics/antiplatelets: Secondary | ICD-10-CM | POA: Diagnosis not present

## 2019-12-05 DIAGNOSIS — F17210 Nicotine dependence, cigarettes, uncomplicated: Secondary | ICD-10-CM | POA: Insufficient documentation

## 2019-12-05 DIAGNOSIS — Z992 Dependence on renal dialysis: Secondary | ICD-10-CM | POA: Diagnosis not present

## 2019-12-05 DIAGNOSIS — Z79899 Other long term (current) drug therapy: Secondary | ICD-10-CM | POA: Diagnosis not present

## 2019-12-05 LAB — BASIC METABOLIC PANEL
Anion gap: 16 — ABNORMAL HIGH (ref 5–15)
BUN: 88 mg/dL — ABNORMAL HIGH (ref 6–20)
CO2: 27 mmol/L (ref 22–32)
Calcium: 8.6 mg/dL — ABNORMAL LOW (ref 8.9–10.3)
Chloride: 100 mmol/L (ref 98–111)
Creatinine, Ser: 18.1 mg/dL — ABNORMAL HIGH (ref 0.61–1.24)
GFR calc Af Amer: 3 mL/min — ABNORMAL LOW (ref >60)
GFR calc non Af Amer: 3 mL/min — ABNORMAL LOW (ref >60)
Glucose, Bld: 97 mg/dL (ref 70–99)
Potassium: 5 mmol/L (ref 3.5–5.1)
Sodium: 143 mmol/L (ref 135–145)

## 2019-12-05 LAB — CBC WITH DIFFERENTIAL/PLATELET
Abs Immature Granulocytes: 0.01 10*3/uL (ref 0.00–0.07)
Basophils Absolute: 0 10*3/uL (ref 0.0–0.1)
Basophils Relative: 1 %
Eosinophils Absolute: 0.1 10*3/uL (ref 0.0–0.5)
Eosinophils Relative: 4 %
HCT: 34.4 % — ABNORMAL LOW (ref 39.0–52.0)
Hemoglobin: 11 g/dL — ABNORMAL LOW (ref 13.0–17.0)
Immature Granulocytes: 0 %
Lymphocytes Relative: 24 %
Lymphs Abs: 0.9 10*3/uL (ref 0.7–4.0)
MCH: 29.9 pg (ref 26.0–34.0)
MCHC: 32 g/dL (ref 30.0–36.0)
MCV: 93.5 fL (ref 80.0–100.0)
Monocytes Absolute: 0.4 10*3/uL (ref 0.1–1.0)
Monocytes Relative: 10 %
Neutro Abs: 2.3 10*3/uL (ref 1.7–7.7)
Neutrophils Relative %: 61 %
Platelets: 119 10*3/uL — ABNORMAL LOW (ref 150–400)
RBC: 3.68 MIL/uL — ABNORMAL LOW (ref 4.22–5.81)
RDW: 14.3 % (ref 11.5–15.5)
WBC: 3.8 10*3/uL — ABNORMAL LOW (ref 4.0–10.5)
nRBC: 0 % (ref 0.0–0.2)

## 2019-12-05 MED ORDER — SODIUM POLYSTYRENE SULFONATE 15 GM/60ML PO SUSP: 15.0000 g | Freq: Two times a day (BID) | ORAL | 0 refills | Status: DC

## 2019-12-05 NOTE — Discharge Instructions (Signed)
You have been seen for your clotted AV fistula in the emergency department.  You will be contacted for a declotting procedure on Monday.  Please take your Kayexalate as prescribed until then.  Return to the emergency department for any shortness of breath, nausea/vomiting, or any other symptom personally concerning to yourself.

## 2019-12-05 NOTE — ED Notes (Signed)
Patient here for clogged dialysis catheter

## 2019-12-05 NOTE — ED Provider Notes (Signed)
[facility] Emergency Department Provider Note  Time seen: 11:09 AM  I have reviewed the triage vital signs and the nursing notes.   HISTORY  Chief Complaint Vascular Access Problem   HPI [name] [name] is a 58 y.o. male with a past medical history of hypertension, ESRD on HD Monday/Wednesday/Friday presents to the emergency department for a clotted right upper extremity fistula.  According to the patient they were unable to perform dialysis today due to the clotted fistula.  Patient did not receive dialysis yesterday due to the holiday.  Patient denies any shortness of breath or nausea.  Past Medical History:  Diagnosis Date  . Anemia of chronic disease    BL Hgb ~10  . CHF (congestive heart failure) ([place])    pt denies 04/01/14  . Diverticulitis   . End-stage renal disease on hemodialysis ([place])    HD m/w/f - right arm graft  . History of blood transfusion   . Hypertension   . Pneumonia    hx of  . Pneumonia   . Polysubstance abuse ([place])   . Renal insufficiency   . Secondary hyperparathyroidism ([place])   . Sleep disturbance   . Tobacco abuse     Patient Active Problem List   Diagnosis Date Noted  . Rectal bleeding 04/16/2019  . Diverticulitis large intestine 12/05/2017  . Diverticulitis of colon 04/24/2015  . Community acquired pneumonia 04/18/2015  . Sepsis ([place]) 04/16/2015  . Shortness of breath 07/07/2013  . Acidosis, metabolic, with respiratory acidosis 04/23/2013  . Acute post-thoracotomy pain 04/23/2013  . Hemorrhagic shock ([place]) 04/23/2013  . S/P sympathectomy 04/23/2013  . 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 Persistent air leak 03/18/2013  . Pneumothorax, right 03/18/2013  . S/P thoracotomy 03/04/2013  . 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 ([place]) 01/17/2013  . Bacteremia 01/06/2013  . Benign neoplasm of colon 12/16/2012  . Diverticulosis of colon (without mention of hemorrhage) 12/16/2012  . ESCRF  (end stage chronic renal failure) ([place]) 12/12/2012  . H/O: hypertension 12/12/2012  . Pleural effusion, right 12/12/2012  . History of prolonged Q-T interval on ECG 12/12/2012  . Hyperkalemia 12/11/2012  . Acute blood loss anemia 12/11/2012  . Sinus tachycardia, resolved with PRBC resuscitation 12/11/2012  . Lower GI bleed 12/11/2012  . End-stage renal disease on hemodialysis ([place])   . Secondary hyperparathyroidism, renal ([place])   . Hypotension   . Polysubstance abuse ([place])   . Anemia of chronic disease   . Tobacco abuse     Past Surgical History:  Procedure Laterality Date  . APPENDECTOMY    . CHEST TUBE INSERTION  02/2013  . COLONOSCOPY  12/16/2012   Procedure: COLONOSCOPY;  Surgeon: Milus Banister, MD;  Location: [place];  Service: Endoscopy;  Laterality: N/A;  . PLEURAL EFFUSION DRAINAGE  12/18/2012   Procedure: DRAINAGE OF PLEURAL EFFUSION;  Surgeon: Melrose Nakayama, MD;  Location: [place];  Service: Thoracic;  Laterality: Right;  . surgery for horseshoe kidney    . TALC PLEURODESIS Right 02/05/2013   Procedure: Pietro Cassis;  Surgeon: Melrose Nakayama, MD;  Location: [place];  Service: Thoracic;  Laterality: Right;  . TEE WITHOUT CARDIOVERSION N/A 01/16/2013   Procedure: TRANSESOPHAGEAL ECHOCARDIOGRAM (TEE);  Surgeon: Sanda Klein, MD;  Location: [facility]COPY;  Service: Cardiovascular;  Laterality: N/A;  . VIDEO ASSISTED THORACOSCOPY  12/18/2012   Procedure: VIDEO ASSISTED THORACOSCOPY;  Surgeon: Melrose Nakayama, MD;  Location: [place];  Service: Thoracic;  Laterality:  Right;  pleural peel  . VIDEO ASSISTED THORACOSCOPY (VATS)/THOROCOTOMY Right 02/19/2013   Procedure: VIDEO ASSISTED THORACOSCOPY (VATS)/THOROCOTOMY;  Surgeon: Melrose Nakayama, MD;  Location: [place];  Service: Thoracic;  Laterality: Right;  Right Video Assisted Thoracoscopy, Right Thoracotomy, Decortication,   . VIDEO BRONCHOSCOPY WITH INSERTION OF INTERBRONCHIAL VALVE (IBV)  01/09/2013   Procedure:  VIDEO BRONCHOSCOPY WITH INSERTION OF INTERBRONCHIAL VALVE (IBV);  Surgeon: Melrose Nakayama, MD;  Location: [place];  Service: Thoracic;  Laterality: N/A;  placement of 2 Intrabronchial valves in right lower lobe  . VIDEO BRONCHOSCOPY WITH INSERTION OF INTERBRONCHIAL VALVE (IBV) Right 02/19/2013   Procedure: VIDEO BRONCHOSCOPY WITH INSERTION OF INTERBRONCHIAL VALVE (IBV);  Surgeon: Melrose Nakayama, MD;  Location: [place];  Service: Thoracic;  Laterality: Right;  removal of two interbronchial valves right chest    Prior to Admission medications   Medication Sig Start Date End Date Taking? Authorizing Provider  acetaminophen (TYLENOL) 500 MG tablet Take 1,000 mg by mouth every 8 (eight) hours as needed for moderate pain.    [provider]  amLODipine (NORVASC) 10 MG tablet Take 10 mg by mouth every evening. 02/19/19   [provider]  amoxicillin-clavulanate (AUGMENTIN) 500-125 MG tablet Take 1 tablet (500 mg total) by mouth 3 (three) times daily. On days of dialysis please take your dose after you have completed dialysis. 11/13/19   Harvest Dark, MD  calcium acetate (PHOSLO) 667 MG capsule Take 2,668 mg by mouth 3 (three) times daily with meals.     [provider]  cinacalcet (SENSIPAR) 90 MG tablet Take 90 mg by mouth daily.    [provider]  clopidogrel (PLAVIX) 75 MG tablet Take 75 mg by mouth daily. 03/29/19   [provider]  diphenhydrAMINE (BENADRYL) 25 mg capsule Take 25 mg by mouth every 6 (six) hours as needed.     [provider]  metoprolol succinate (TOPROL-XL) 50 MG 24 hr tablet Take 50 mg by mouth daily. Take with or immediately following a meal.    [provider]  ondansetron (ZOFRAN ODT) 4 MG disintegrating tablet Take 1 tablet (4 mg total) by mouth every 8 (eight) hours as needed for nausea or vomiting. 11/13/19   Harvest Dark, MD  promethazine (PHENERGAN) 12.5 MG tablet TAKE 1 TAB BY MOUTH ONCE A DAY AS  NEEDED FOR VOMITING. 04/10/19   [provider]    Allergies  Allergen Reactions  . Other     Other reaction(s): Other (See Comments) blister  . Tape Other (See Comments)    Plastic Tape Causes blister    Family History  Problem Relation Age of Onset  . Thyroid disease Mother   . Renal Disease Maternal Grandmother   . Diabetes Maternal Grandmother   . Hypertension Maternal Aunt   . Hypertension Maternal Uncle     Social History Social History   Tobacco Use  . Smoking status: Current Every Day Smoker    Packs/day: 6.00    Years: 20.00    Pack years: 120.00    Types: Cigarettes  . Smokeless tobacco: Current User  Substance Use Topics  . Alcohol use: Yes    Alcohol/week: 3.0 standard drinks    Types: 3 Cans of beer per week    Comment: occasional  . Drug use: Yes    Types: Marijuana    Comment: remote cocaine abuse, ongoing marijuana use.    Review of Systems Constitutional: Negative for fever. Cardiovascular: Negative for chest pain. Respiratory: Negative  for shortness of breath. Gastrointestinal: Negative for abdominal pain, vomiting Musculoskeletal: Negative for musculoskeletal complaints Neurological: Negative for headache All other ROS negative  ____________________________________________   PHYSICAL EXAM:  VITAL SIGNS: ED Triage Vitals  Enc Vitals Group     BP 12/05/19 0758 (!) 177/96     Pulse Rate 12/05/19 0758 78     Resp 12/05/19 0758 16     Temp 12/05/19 0758 99 F (37.2 C)     Temp Source 12/05/19 0758 Oral     SpO2 12/05/19 0758 97 %     Weight 12/05/19 0755 207 lb 3.7 oz (94 kg)     Height 12/05/19 0755 6' (1.829 m)     Head Circumference --      Peak Flow --      Pain Score 12/05/19 0755 0     Pain Loc --      Pain Edu? --      Excl. in [place]? --    Constitutional: Alert and oriented. Well appearing and in no distress. Eyes: Normal exam ENT      Head: Normocephalic and atraumatic.      Mouth/Throat: Mucous membranes are  moist. Cardiovascular: Normal rate, regular rhythm.  Respiratory: Normal respiratory effort without tachypnea nor retractions. Breath sounds are clear Gastrointestinal: Soft and nontender. No distention.   Musculoskeletal: Right upper extremity fistula present without thrill. Neurologic:  Normal speech and language. No gross focal neurologic deficits Skin:  Skin is warm, dry and intact.  Psychiatric: Mood and affect are normal.   ____________________________________________   INITIAL IMPRESSION / ASSESSMENT AND PLAN / ED COURSE  Pertinent labs & imaging results that were available during my care of the patient were reviewed by me and considered in my medical decision making (see chart for details).   Patient presents to the emergency department for a clotted fistula last dialysis was Wednesday.  Reassuringly patient's lab work today shows a potassium of 5.0 BUN is elevated to 88.  I discussed the patient with Dr. Juleen China as well as Dr. Lorenso Courier.  They could perform declotting tonight and dialysis tomorrow however the patient is adamant that he will not be admitted to the hospital, states he will just return Monday and have it declotted at that time.  I discussed this with Dr. Juleen China and they will arrange for outpatient declotting on Monday.  As the patient's potassium is somewhat elevated at 5.0 we will discharge with Kayexalate.  Patient agreeable to plan.  [name] [name] was evaluated in Emergency Department on 12/05/2019 for the symptoms described in the history of present illness. He was evaluated in the context of the global COVID-19 pandemic, which necessitated consideration that the patient might be at risk for infection with the SARS-CoV-2 virus that causes COVID-19. Institutional protocols and algorithms that pertain to the evaluation of patients at risk for COVID-19 are in a state of rapid change based on information released by regulatory bodies including the CDC and federal and state  organizations. These policies and algorithms were followed during the patient's care in the ED.  ____________________________________________   FINAL CLINICAL IMPRESSION(S) / ED DIAGNOSES  Hemodialysis access complication   Harvest Dark, MD 12/05/19 1120

## 2019-12-05 NOTE — ED Triage Notes (Signed)
Pt to ED via POV, Pt states that he went for dialysis this morning and this catheter was clotted off. Pt is on MWF schedule, Pt had full treatment on Wednesday. Pt is in NAD.

## 2019-12-07 ENCOUNTER — Other Ambulatory Visit
Admission: RE | Admit: 2019-12-07 | Discharge: 2019-12-07 | Disposition: A | Discharge status: Home / Self Care | Payer: Medicare Other | Source: Ambulatory Visit | Attending: Vascular Surgery | Admitting: Vascular Surgery

## 2019-12-07 ENCOUNTER — Other Ambulatory Visit: Payer: Self-pay

## 2019-12-07 ENCOUNTER — Ambulatory Visit
Admission: RE | Admit: 2019-12-07 | Discharge: 2019-12-07 | Disposition: A | Discharge status: Home / Self Care | Payer: Medicare Other | Source: Ambulatory Visit | Attending: Vascular Surgery | Admitting: Vascular Surgery

## 2019-12-07 ENCOUNTER — Ambulatory Visit: Admit: 2019-12-07 | Payer: Medicare Other | Admitting: Vascular Surgery

## 2019-12-07 DIAGNOSIS — Z5329 Procedure and treatment not carried out because of patient's decision for other reasons: Secondary | ICD-10-CM | POA: Insufficient documentation

## 2019-12-07 DIAGNOSIS — N186 End stage renal disease: Secondary | ICD-10-CM | POA: Diagnosis not present

## 2019-12-07 DIAGNOSIS — Z20828 Contact with and (suspected) exposure to other viral communicable diseases: Secondary | ICD-10-CM | POA: Insufficient documentation

## 2019-12-07 LAB — RESPIRATORY PANEL BY RT PCR (FLU A&B, COVID)
Influenza A by PCR: NEGATIVE
Influenza B by PCR: NEGATIVE
SARS Coronavirus 2 by RT PCR: NEGATIVE

## 2019-12-07 SURGERY — A/V SHUNT INTERVENTION
Anesthesia: Moderate Sedation

## 2019-12-07 NOTE — Progress Notes (Signed)
Patient refusing to have procedure and leaving AMA

## 2019-12-07 NOTE — OR Nursing (Signed)
When pt was informed who the performing physician was, Dr dew, he refused to have procedure. Pt said I will not let him touch me. Pt called dialysis center to have them set up for him to have procedure at [facility]. I told pt his potassium level was 5.0 on 12/26 and that we would highly recommend he stay for procedure due to high risk of complications if he delays procedure. He said he would refuse and left [facility].

## 2020-06-07 IMAGING — CT CT ABD-PELV W/ CM
2 of 5 series · 16 of 46 positions shown, 18 images · IV contrast (omnipaque)
Comparison: None.

CLINICAL DATA: Vomiting and abdominal pain for 2 days.

EXAM:
CT ABDOMEN AND PELVIS WITH CONTRAST
TECHNIQUE: Multidetector CT imaging of the abdomen and pelvis was performed
using the standard protocol following bolus administration of
intravenous contrast.
CONTRAST:  100mL OMNIPAQUE IOHEXOL 300 MG/ML  SOLN

[Series 2: routine abd/pel with · axial · 0.80mm/px · z∈[-476,-36]mm · 13 of 98 slices shown, 15 images]
[im 5/98  soft-tissue]
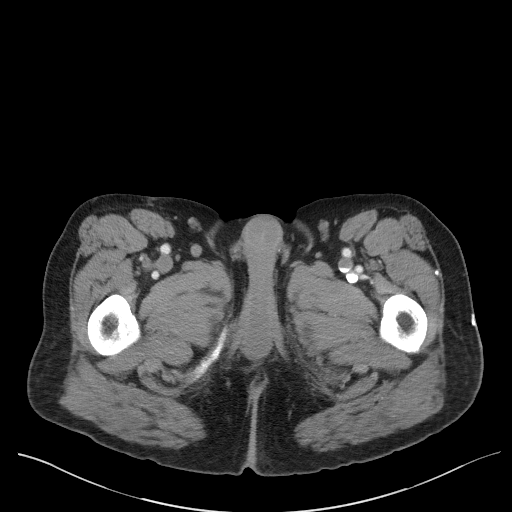
[im 5/98  bone]
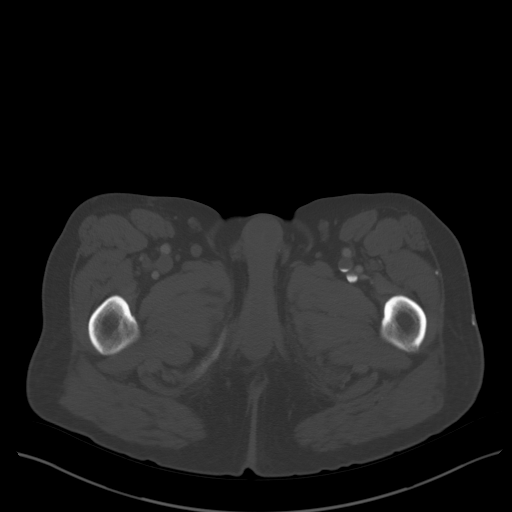
[im 15/98  soft-tissue]
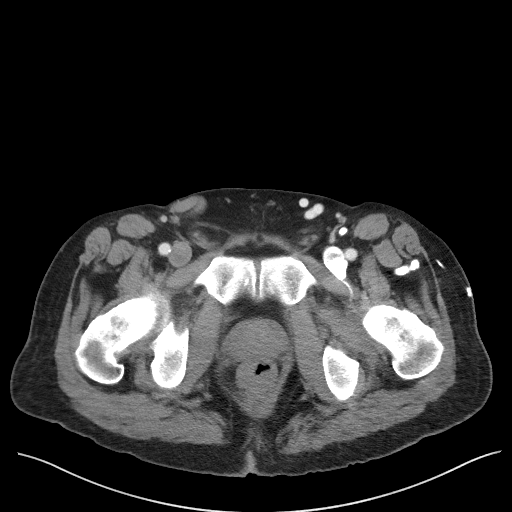
[im 20/98  soft-tissue]
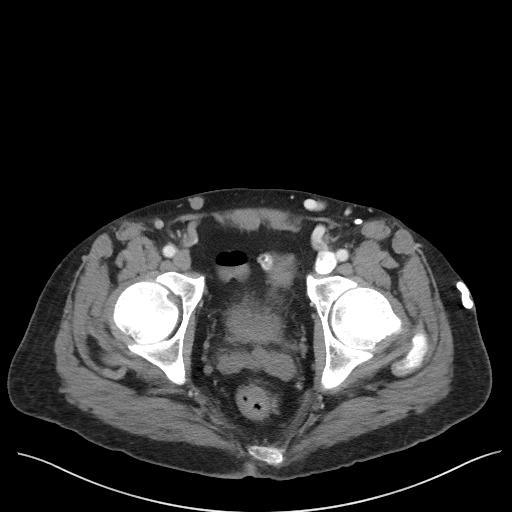
[im 30/98  soft-tissue]
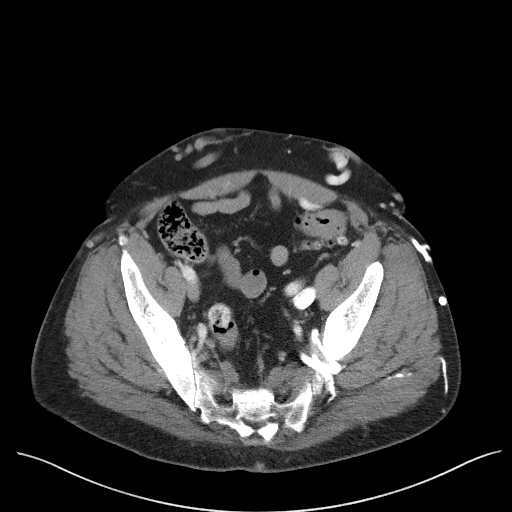
[im 34/98  soft-tissue]
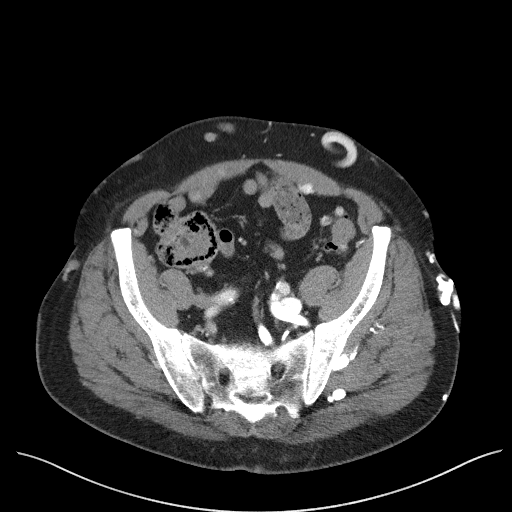
[im 44/98  soft-tissue]
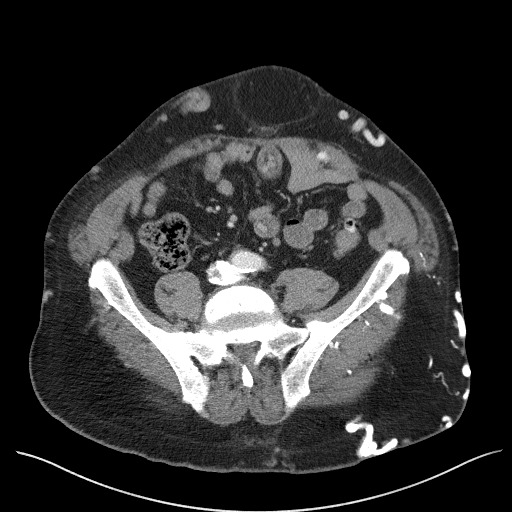
[im 49/98  soft-tissue]
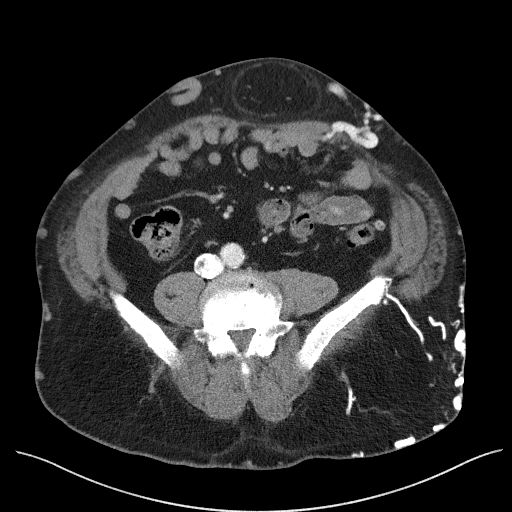
[im 54/98  soft-tissue]
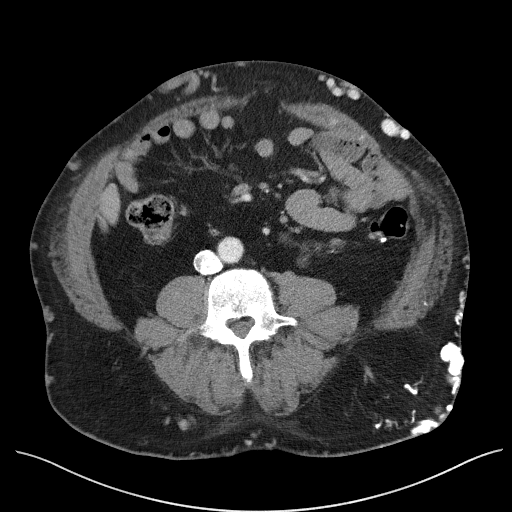
[im 64/98  soft-tissue]
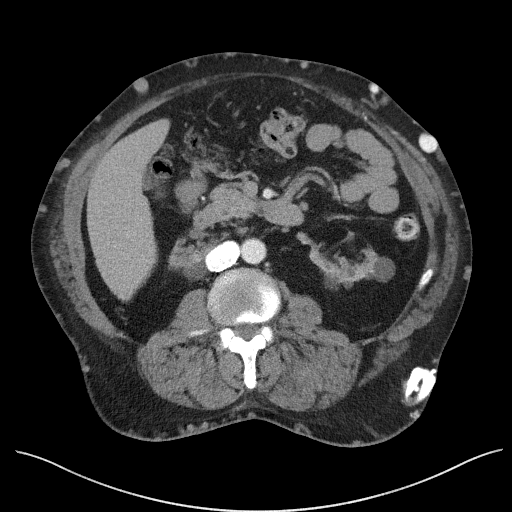
[im 64/98  bone]
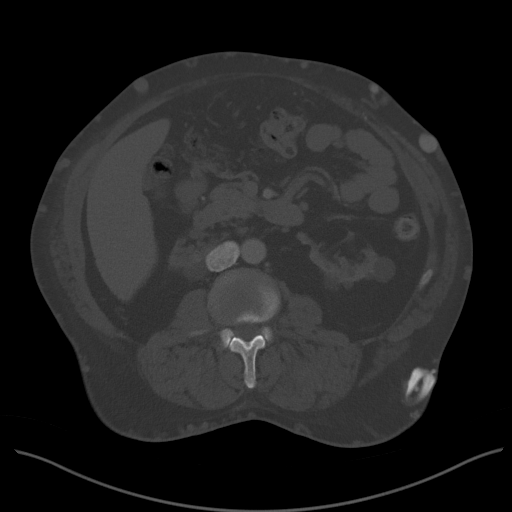
[im 68/98  soft-tissue]
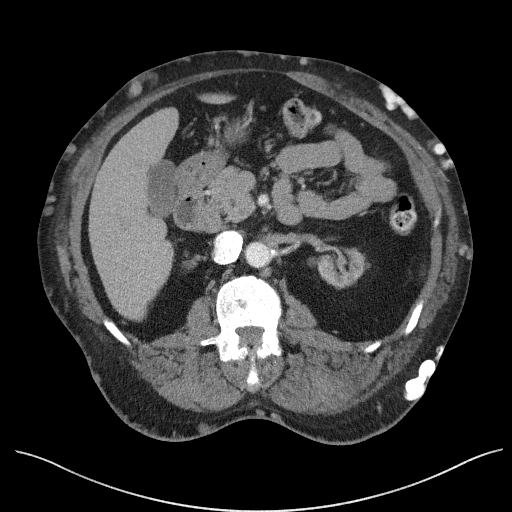
[im 78/98  soft-tissue]
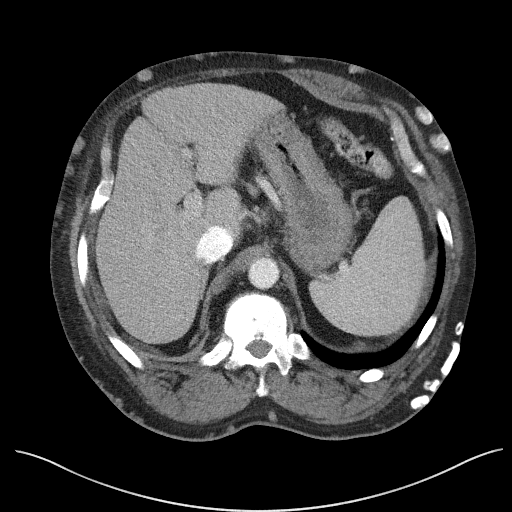
[im 83/98  soft-tissue]
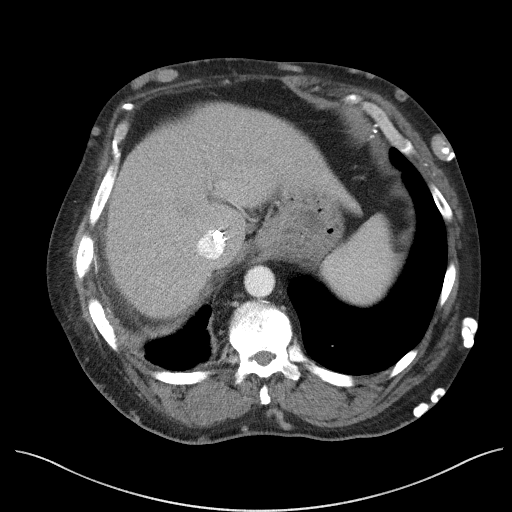
[im 93/98  soft-tissue]
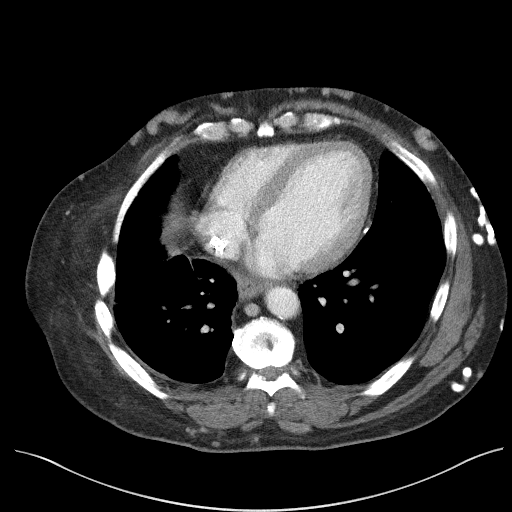

[Series 5: coronal st · coronal · 0.80mm/px · 3 of 107 slices shown]
[im 36/107  soft-tissue]
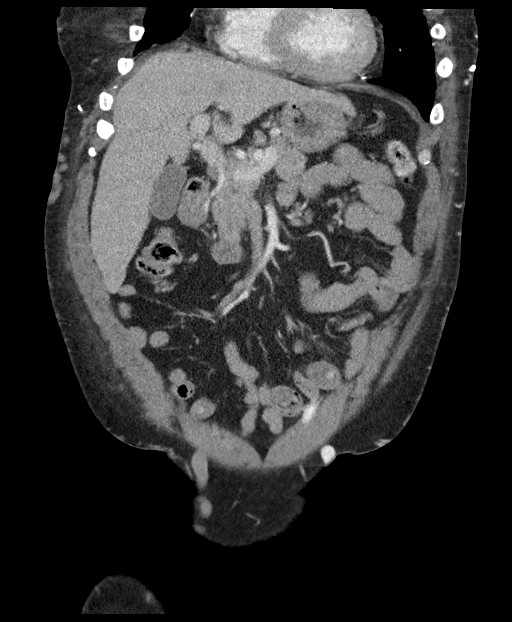
[im 48/107  soft-tissue]
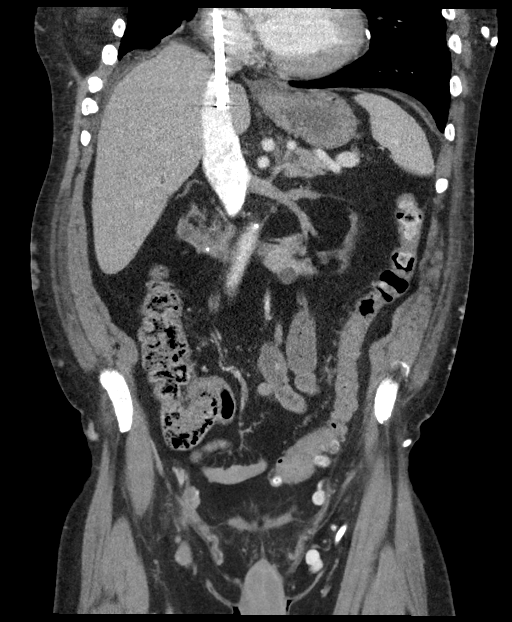
[im 59/107  soft-tissue]
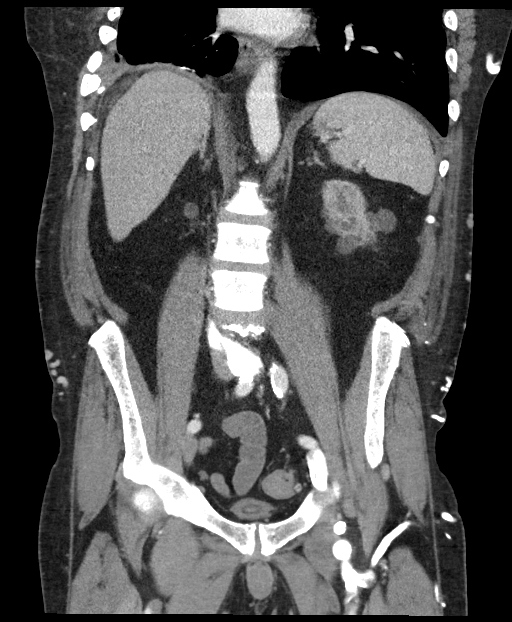

[16 of 46 positions shown; findings below may reference images not displayed]

FINDINGS: Lower chest: The included heart size is normal without pericardial
effusion. Dialysis catheter terminates in the right atrium.
Atelectasis is noted at the right base posterolaterally.

Hepatobiliary: No focal liver abnormality is seen. No gallstones,
gallbladder wall thickening, or biliary dilatation.

Pancreas: Unremarkable. No pancreatic ductal dilatation or
surrounding inflammatory changes.

Spleen: Normal in size without focal abnormality.

Adrenals/Urinary Tract: Atrophic horseshoe kidney with stable cysts
within the right and left renal moieties. Normal bilateral adrenal
glands. No obstructive uropathy or calculi. Decompressed urinary
bladder.

Stomach/Bowel: Small hiatal hernia. The stomach is decompressed in
appearance. There is normal small bowel rotation. Moderate stool
retention is noted with scattered left-sided colonic diverticulosis
more so along the sigmoid colon without acute diverticulitis.

Vascular/Lymphatic: Numerous venous collaterals are again
demonstrated. The right common iliac vein and branch vessels are not
opacified likely due to preferential collateral vessels noted along
the left side of the abdomen pelvis feeding the internal iliac vein
on the left as opposed to chronic DVT.

Reproductive: Normal

Other: No free air nor free fluid. Large fat containing
supraumbilical hernia with 2.9 cm transverse neck. No evidence of
incarceration.

Musculoskeletal: Stigmata of chronic renal osteodystrophy with
sclerosis. Schmorl's nodes identified at L1 and L2. Degenerative
disc disease L4-5.
IMPRESSION: 1. Large fat containing supraumbilical hernia without incarceration.
2. Atrophic horseshoe kidney with bilateral renal cysts. No
obstructive uropathy.
3. Colonic diverticulosis without acute diverticulitis.
4. Extensive body wall venous collaterals as before.

## 2021-03-21 IMAGING — CT CT ABD-PELV W/O CM
2 of 4 series · 16 of 46 positions shown, 18 images · non-contrast
Comparison: CT 01/30/2019

CLINICAL DATA: Vomiting after dialysis

EXAM:
CT ABDOMEN AND PELVIS WITHOUT CONTRAST
TECHNIQUE: Multidetector CT imaging of the abdomen and pelvis was performed
following the standard protocol without IV contrast.

[Series 2: routine abd/pel wo · axial · 0.88mm/px · z∈[-1318,-878]mm · 13 of 96 slices shown, 15 images]
[im 4/96  soft-tissue]
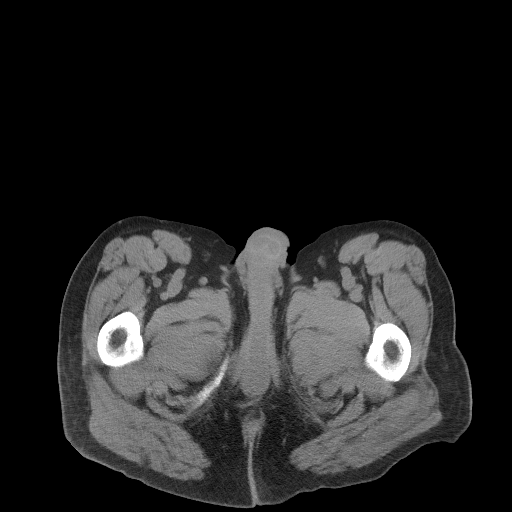
[im 4/96  bone]
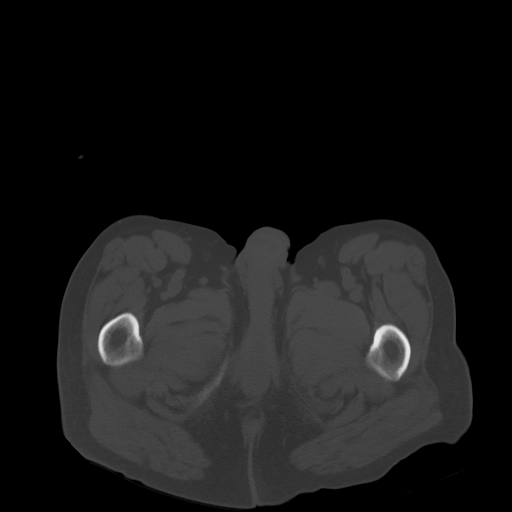
[im 12/96  soft-tissue]
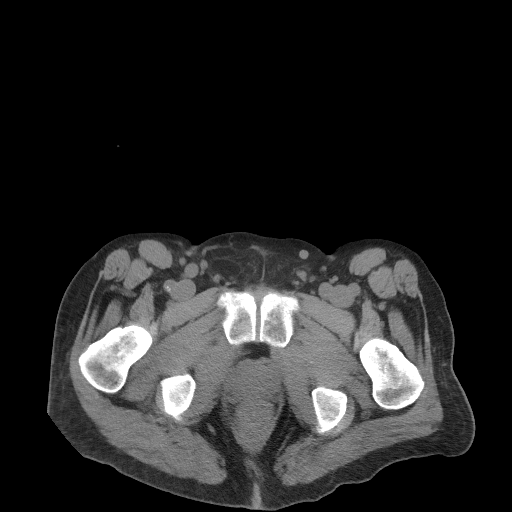
[im 20/96  soft-tissue]
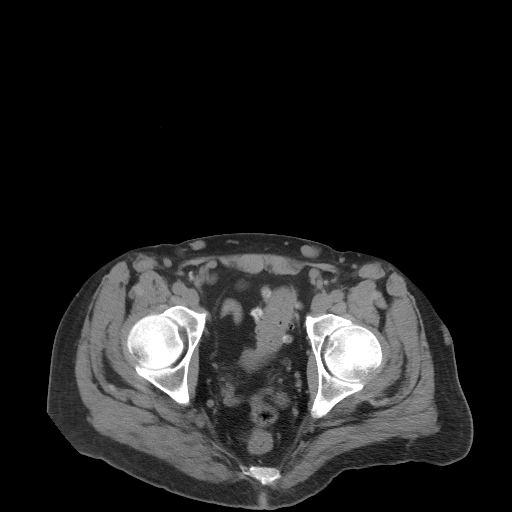
[im 27/96  soft-tissue]
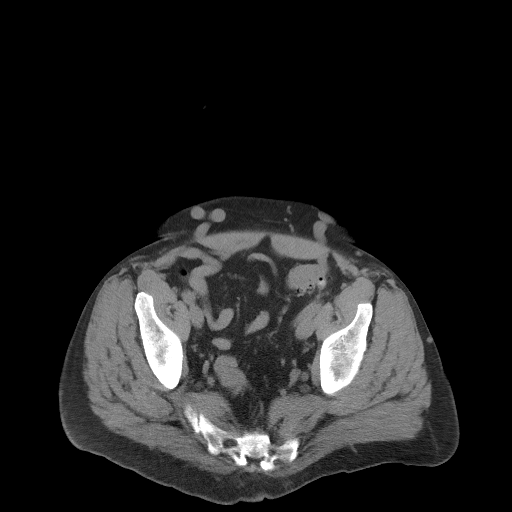
[im 35/96  soft-tissue]
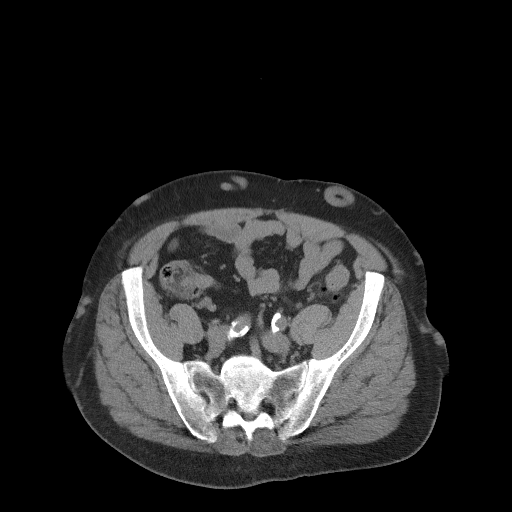
[im 42/96  soft-tissue]
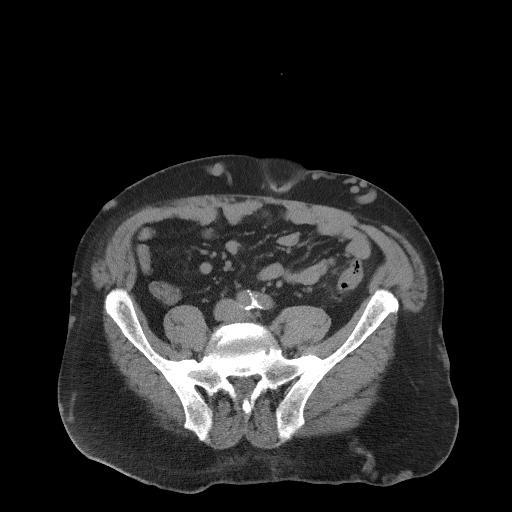
[im 50/96  soft-tissue]
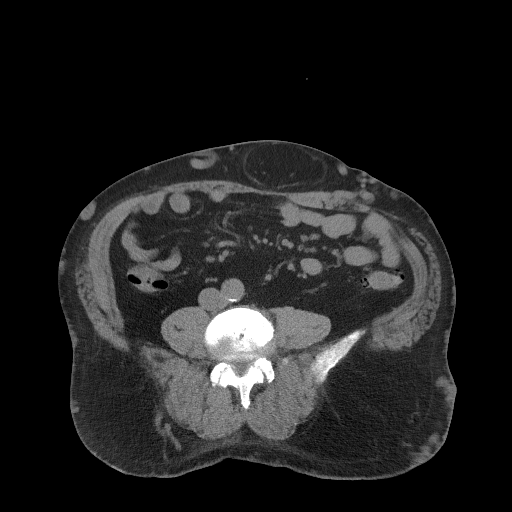
[im 54/96  soft-tissue]
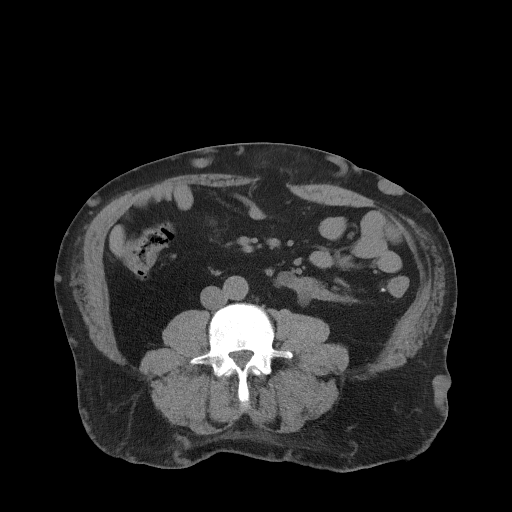
[im 61/96  soft-tissue]
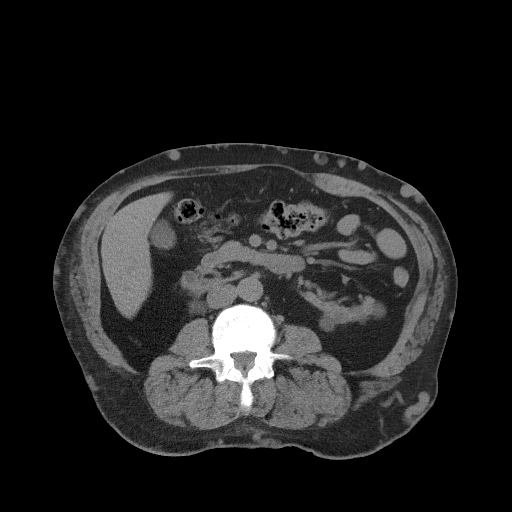
[im 61/96  bone]
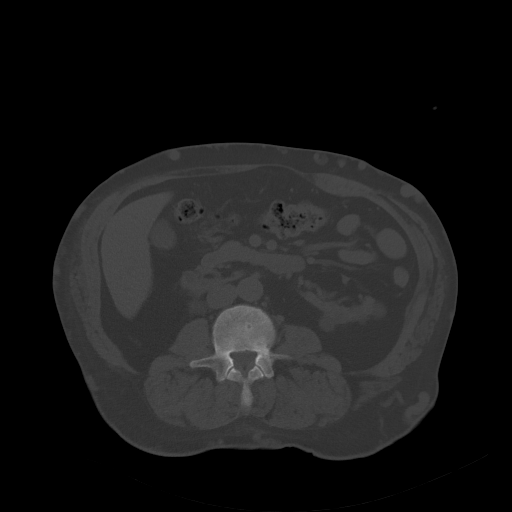
[im 69/96  soft-tissue]
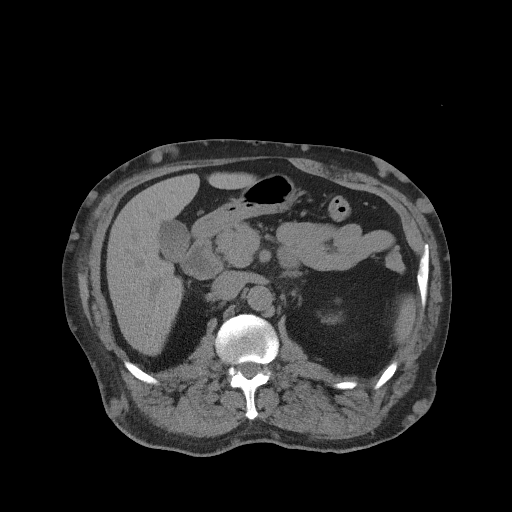
[im 77/96  soft-tissue]
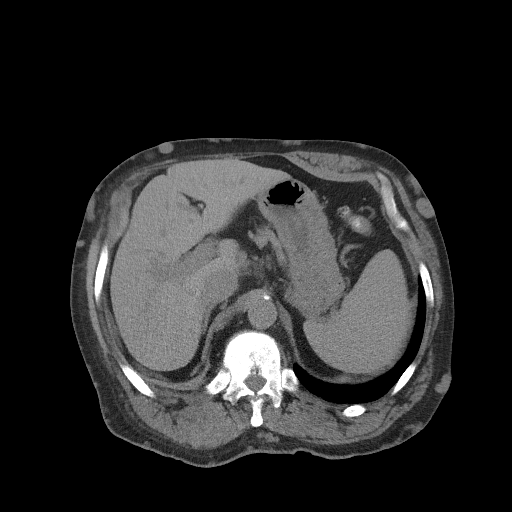
[im 84/96  soft-tissue]
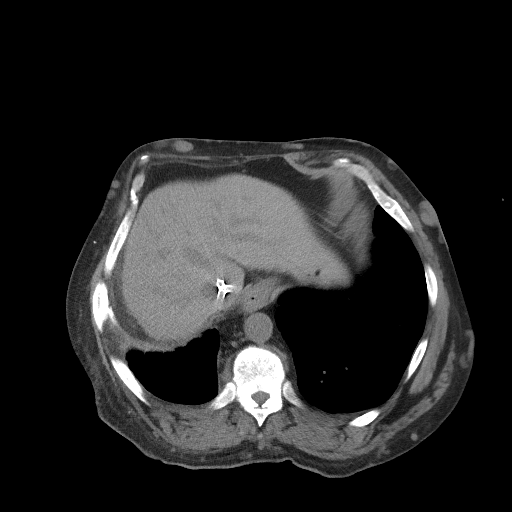
[im 92/96  soft-tissue]
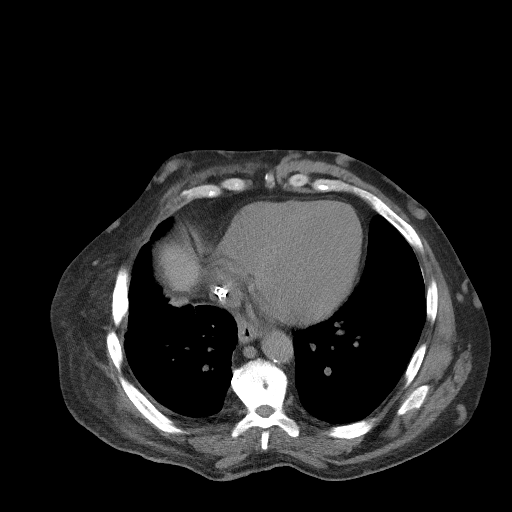

[Series 5: coronal st · coronal · 0.78mm/px · 3 of 104 slices shown]
[im 35/104  soft-tissue]
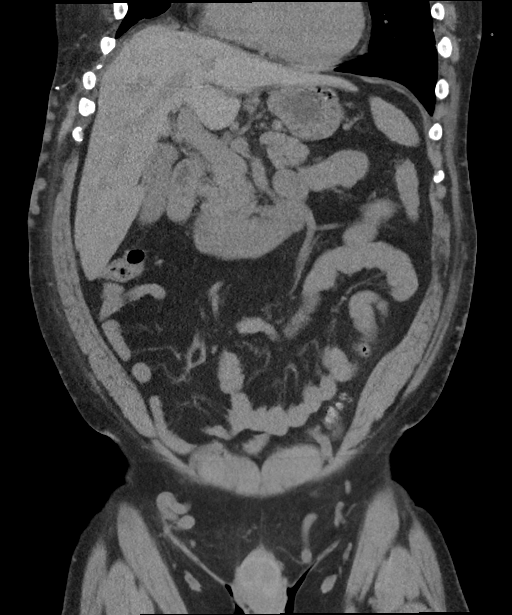
[im 46/104  soft-tissue]
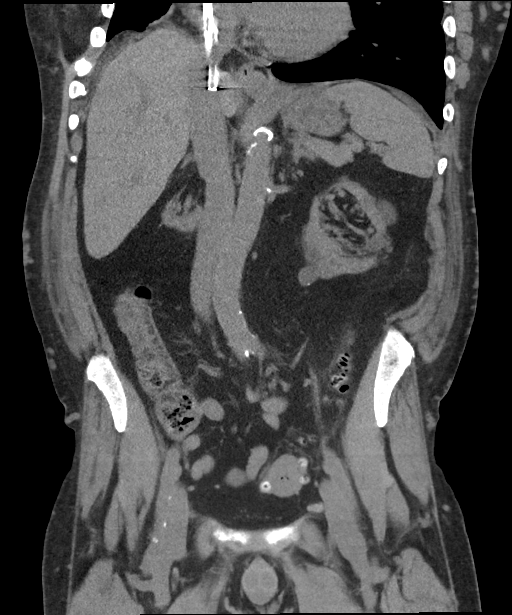
[im 58/104  soft-tissue]
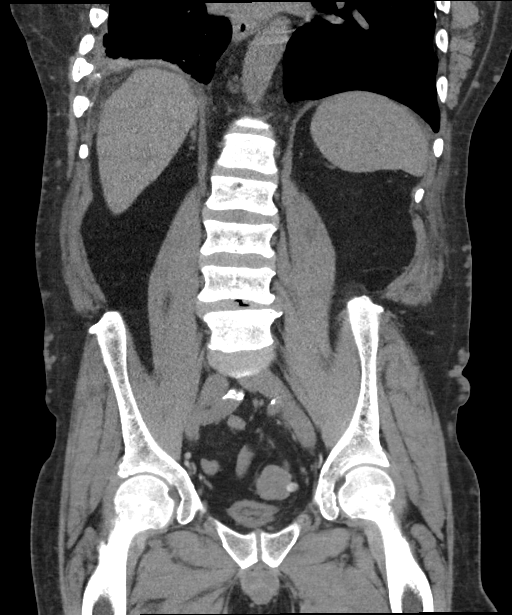

[16 of 46 positions shown; findings below may reference images not displayed]

FINDINGS: Lower chest: Lung bases demonstrate no acute consolidation or
pleural effusion. Mild cardiomegaly. Central venous catheter tip
terminates in the intra hepatic IVC.

Hepatobiliary: No focal hepatic abnormality, calcified gallstone or
biliary dilatation

Pancreas: Unremarkable. No pancreatic ductal dilatation or
surrounding inflammatory changes.

Spleen: Normal in size without focal abnormality.

Adrenals/Urinary Tract: Adrenal glands are within normal limits.
Atrophic horseshoe kidney with multiple cysts. No hydronephrosis.
The bladder is empty

Stomach/Bowel: The stomach is nonenlarged. No dilated small bowel.
Negative appendix. Numerous descending and sigmoid colon
diverticula. Suspicion of mild edema surrounding the distal
descending/proximal sigmoid colon.

Vascular/Lymphatic: Moderate to marked aortic atherosclerosis.
Numerous collateral vessels in the body wall. No significant
adenopathy.

Reproductive: Prostate is unremarkable.

Other: Negative for free air or free fluid. Moderate supraumbilical
fat containing ventral hernia.

Musculoskeletal: No acute or suspicious osseous abnormality. Mild
diffuse sclerosis consistent with chronic kidney disease.
IMPRESSION: 1. Diffuse diverticular disease of the descending and sigmoid colon
with suspicion of minimal edema in the fat around the distal
descending/proximal sigmoid colon suggesting possible mild
diverticulitis.
2. Atrophic horseshoe kidney with multiple cysts but no
hydronephrosis
3. Extensive body wall venous collaterals as demonstrated on
previous exams.

## 2021-08-08 ENCOUNTER — Emergency Department: Payer: Medicare Other

## 2021-08-08 ENCOUNTER — Encounter: Payer: Self-pay | Admitting: Emergency Medicine

## 2021-08-08 ENCOUNTER — Inpatient Hospital Stay
Admission: EM | Admit: 2021-08-08 | Discharge: 2021-09-01 | DRG: 288 | Disposition: A | Discharge status: Other Acute Inpatient Hospital | Payer: Medicare Other | Attending: Family Medicine | Admitting: Family Medicine

## 2021-08-08 ENCOUNTER — Other Ambulatory Visit: Payer: Self-pay

## 2021-08-08 DIAGNOSIS — I330 Acute and subacute infective endocarditis: Principal | ICD-10-CM | POA: Diagnosis present

## 2021-08-08 DIAGNOSIS — K625 Hemorrhage of anus and rectum: Secondary | ICD-10-CM | POA: Diagnosis not present

## 2021-08-08 DIAGNOSIS — Z4659 Encounter for fitting and adjustment of other gastrointestinal appliance and device: Secondary | ICD-10-CM

## 2021-08-08 DIAGNOSIS — Z515 Encounter for palliative care: Secondary | ICD-10-CM

## 2021-08-08 DIAGNOSIS — K6812 Psoas muscle abscess: Secondary | ICD-10-CM | POA: Diagnosis present

## 2021-08-08 DIAGNOSIS — F17210 Nicotine dependence, cigarettes, uncomplicated: Secondary | ICD-10-CM | POA: Diagnosis present

## 2021-08-08 DIAGNOSIS — Z20822 Contact with and (suspected) exposure to covid-19: Secondary | ICD-10-CM | POA: Diagnosis present

## 2021-08-08 DIAGNOSIS — R0602 Shortness of breath: Secondary | ICD-10-CM

## 2021-08-08 DIAGNOSIS — I639 Cerebral infarction, unspecified: Secondary | ICD-10-CM | POA: Diagnosis not present

## 2021-08-08 DIAGNOSIS — M5440 Lumbago with sciatica, unspecified side: Secondary | ICD-10-CM | POA: Diagnosis not present

## 2021-08-08 DIAGNOSIS — A4181 Sepsis due to Enterococcus: Secondary | ICD-10-CM | POA: Diagnosis not present

## 2021-08-08 DIAGNOSIS — Z95828 Presence of other vascular implants and grafts: Secondary | ICD-10-CM

## 2021-08-08 DIAGNOSIS — I10 Essential (primary) hypertension: Secondary | ICD-10-CM | POA: Diagnosis present

## 2021-08-08 DIAGNOSIS — I82C19 Acute embolism and thrombosis of unspecified internal jugular vein: Secondary | ICD-10-CM | POA: Diagnosis present

## 2021-08-08 DIAGNOSIS — Z720 Tobacco use: Secondary | ICD-10-CM

## 2021-08-08 DIAGNOSIS — Z66 Do not resuscitate: Secondary | ICD-10-CM | POA: Diagnosis not present

## 2021-08-08 DIAGNOSIS — N2581 Secondary hyperparathyroidism of renal origin: Secondary | ICD-10-CM | POA: Diagnosis present

## 2021-08-08 DIAGNOSIS — I132 Hypertensive heart and chronic kidney disease with heart failure and with stage 5 chronic kidney disease, or end stage renal disease: Secondary | ICD-10-CM | POA: Diagnosis present

## 2021-08-08 DIAGNOSIS — N186 End stage renal disease: Secondary | ICD-10-CM | POA: Diagnosis present

## 2021-08-08 DIAGNOSIS — I82C11 Acute embolism and thrombosis of right internal jugular vein: Secondary | ICD-10-CM | POA: Diagnosis present

## 2021-08-08 DIAGNOSIS — R7881 Bacteremia: Secondary | ICD-10-CM

## 2021-08-08 DIAGNOSIS — Z8349 Family history of other endocrine, nutritional and metabolic diseases: Secondary | ICD-10-CM

## 2021-08-08 DIAGNOSIS — K5900 Constipation, unspecified: Secondary | ICD-10-CM | POA: Diagnosis present

## 2021-08-08 DIAGNOSIS — F1910 Other psychoactive substance abuse, uncomplicated: Secondary | ICD-10-CM

## 2021-08-08 DIAGNOSIS — Z992 Dependence on renal dialysis: Secondary | ICD-10-CM

## 2021-08-08 DIAGNOSIS — Z7902 Long term (current) use of antithrombotics/antiplatelets: Secondary | ICD-10-CM

## 2021-08-08 DIAGNOSIS — Z833 Family history of diabetes mellitus: Secondary | ICD-10-CM

## 2021-08-08 DIAGNOSIS — D631 Anemia in chronic kidney disease: Secondary | ICD-10-CM | POA: Diagnosis present

## 2021-08-08 DIAGNOSIS — M549 Dorsalgia, unspecified: Secondary | ICD-10-CM | POA: Diagnosis present

## 2021-08-08 DIAGNOSIS — I871 Compression of vein: Secondary | ICD-10-CM | POA: Diagnosis not present

## 2021-08-08 DIAGNOSIS — I5042 Chronic combined systolic (congestive) and diastolic (congestive) heart failure: Secondary | ICD-10-CM | POA: Diagnosis present

## 2021-08-08 DIAGNOSIS — M4636 Infection of intervertebral disc (pyogenic), lumbar region: Secondary | ICD-10-CM | POA: Diagnosis present

## 2021-08-08 DIAGNOSIS — I959 Hypotension, unspecified: Secondary | ICD-10-CM | POA: Diagnosis not present

## 2021-08-08 DIAGNOSIS — M4622 Osteomyelitis of vertebra, cervical region: Secondary | ICD-10-CM | POA: Diagnosis present

## 2021-08-08 DIAGNOSIS — Z8249 Family history of ischemic heart disease and other diseases of the circulatory system: Secondary | ICD-10-CM

## 2021-08-08 DIAGNOSIS — Z79899 Other long term (current) drug therapy: Secondary | ICD-10-CM

## 2021-08-08 DIAGNOSIS — F32A Depression, unspecified: Secondary | ICD-10-CM | POA: Diagnosis present

## 2021-08-08 DIAGNOSIS — G9341 Metabolic encephalopathy: Secondary | ICD-10-CM | POA: Diagnosis not present

## 2021-08-08 DIAGNOSIS — M545 Low back pain, unspecified: Secondary | ICD-10-CM | POA: Diagnosis present

## 2021-08-08 DIAGNOSIS — F05 Delirium due to known physiological condition: Secondary | ICD-10-CM | POA: Diagnosis not present

## 2021-08-08 DIAGNOSIS — M48061 Spinal stenosis, lumbar region without neurogenic claudication: Secondary | ICD-10-CM | POA: Diagnosis present

## 2021-08-08 DIAGNOSIS — Q631 Lobulated, fused and horseshoe kidney: Secondary | ICD-10-CM

## 2021-08-08 DIAGNOSIS — S37009A Unspecified injury of unspecified kidney, initial encounter: Secondary | ICD-10-CM

## 2021-08-08 DIAGNOSIS — E669 Obesity, unspecified: Secondary | ICD-10-CM | POA: Diagnosis present

## 2021-08-08 DIAGNOSIS — R29721 NIHSS score 21: Secondary | ICD-10-CM | POA: Diagnosis not present

## 2021-08-08 DIAGNOSIS — I82210 Acute embolism and thrombosis of superior vena cava: Secondary | ICD-10-CM | POA: Diagnosis present

## 2021-08-08 DIAGNOSIS — M62838 Other muscle spasm: Secondary | ICD-10-CM | POA: Diagnosis not present

## 2021-08-08 DIAGNOSIS — Z9189 Other specified personal risk factors, not elsewhere classified: Secondary | ICD-10-CM

## 2021-08-08 LAB — COMPREHENSIVE METABOLIC PANEL
ALT: 13 U/L (ref 0–44)
AST: 16 U/L (ref 15–41)
Albumin: 2.6 g/dL — ABNORMAL LOW (ref 3.5–5.0)
Alkaline Phosphatase: 79 U/L (ref 38–126)
Anion gap: 10 (ref 5–15)
BUN: 49 mg/dL — ABNORMAL HIGH (ref 6–20)
CO2: 28 mmol/L (ref 22–32)
Calcium: 9.7 mg/dL (ref 8.9–10.3)
Chloride: 101 mmol/L (ref 98–111)
Creatinine, Ser: 8.43 mg/dL — ABNORMAL HIGH (ref 0.61–1.24)
GFR, Estimated: 7 mL/min — ABNORMAL LOW (ref >60)
Glucose, Bld: 93 mg/dL (ref 70–99)
Potassium: 4.6 mmol/L (ref 3.5–5.1)
Sodium: 139 mmol/L (ref 135–145)
Total Bilirubin: 1.1 mg/dL (ref 0.3–1.2)
Total Protein: 6.9 g/dL (ref 6.5–8.1)

## 2021-08-08 LAB — CBC WITH DIFFERENTIAL/PLATELET
Abs Immature Granulocytes: 0.05 10*3/uL (ref 0.00–0.07)
Basophils Absolute: 0 10*3/uL (ref 0.0–0.1)
Basophils Relative: 0 %
Eosinophils Absolute: 0 10*3/uL (ref 0.0–0.5)
Eosinophils Relative: 0 %
HCT: 30.3 % — ABNORMAL LOW (ref 39.0–52.0)
Hemoglobin: 9.6 g/dL — ABNORMAL LOW (ref 13.0–17.0)
Immature Granulocytes: 1 %
Lymphocytes Relative: 7 %
Lymphs Abs: 0.7 10*3/uL (ref 0.7–4.0)
MCH: 30.2 pg (ref 26.0–34.0)
MCHC: 31.7 g/dL (ref 30.0–36.0)
MCV: 95.3 fL (ref 80.0–100.0)
Monocytes Absolute: 0.8 10*3/uL (ref 0.1–1.0)
Monocytes Relative: 9 %
Neutro Abs: 7.6 10*3/uL (ref 1.7–7.7)
Neutrophils Relative %: 83 %
Platelets: 136 10*3/uL — ABNORMAL LOW (ref 150–400)
RBC: 3.18 MIL/uL — ABNORMAL LOW (ref 4.22–5.81)
RDW: 16.7 % — ABNORMAL HIGH (ref 11.5–15.5)
WBC: 9.3 10*3/uL (ref 4.0–10.5)
nRBC: 0 % (ref 0.0–0.2)

## 2021-08-08 LAB — RESP PANEL BY RT-PCR (FLU A&B, COVID) ARPGX2
Influenza A by PCR: NEGATIVE
Influenza B by PCR: NEGATIVE
SARS Coronavirus 2 by RT PCR: NEGATIVE

## 2021-08-08 LAB — HEPATITIS B SURFACE ANTIGEN: Hepatitis B Surface Ag: NONREACTIVE

## 2021-08-08 MED ORDER — PENTAFLUOROPROP-TETRAFLUOROETH EX AERO
1.0000 "application " | INHALATION_SPRAY | CUTANEOUS | Status: DC | PRN
  Filled 2021-08-08: qty 30

## 2021-08-08 MED ORDER — APIXABAN 5 MG PO TABS: 5.0000 mg | ORAL_TABLET | Freq: Every day | ORAL | Status: DC

## 2021-08-08 MED ORDER — AMLODIPINE BESYLATE 5 MG PO TABS
5.0000 mg | ORAL_TABLET | Freq: Every evening | ORAL | Status: DC
  Administered 2021-08-08 – 2021-08-10 (×3): 5 mg via ORAL
  Filled 2021-08-08 (×3): qty 1

## 2021-08-08 MED ORDER — MORPHINE SULFATE (PF) 2 MG/ML IV SOLN
2.0000 mg | INTRAVENOUS | Status: DC | PRN
  Administered 2021-08-09: 2 mg via INTRAVENOUS
  Filled 2021-08-08: qty 1

## 2021-08-08 MED ORDER — HYDRALAZINE HCL 20 MG/ML IJ SOLN: 5.0000 mg | INTRAMUSCULAR | Status: DC | PRN

## 2021-08-08 MED ORDER — MORPHINE SULFATE (PF) 4 MG/ML IV SOLN
4.0000 mg | Freq: Once | INTRAVENOUS | Status: AC
  Administered 2021-08-08: 4 mg via INTRAVENOUS
  Filled 2021-08-08: qty 1

## 2021-08-08 MED ORDER — LIDOCAINE-PRILOCAINE 2.5-2.5 % EX CREA
1.0000 "application " | TOPICAL_CREAM | CUTANEOUS | Status: DC | PRN
  Filled 2021-08-08: qty 5

## 2021-08-08 MED ORDER — ONDANSETRON HCL 4 MG/2ML IJ SOLN
4.0000 mg | Freq: Once | INTRAMUSCULAR | Status: AC
Start: 2021-08-08 — End: 2021-08-08
  Administered 2021-08-08: 4 mg via INTRAVENOUS
  Filled 2021-08-08: qty 2

## 2021-08-08 MED ORDER — HEPARIN SODIUM (PORCINE) 5000 UNIT/ML IJ SOLN
5000.0000 [IU] | Freq: Three times a day (TID) | INTRAMUSCULAR | Status: DC
  Administered 2021-08-09 (×2): 5000 [IU] via SUBCUTANEOUS
  Filled 2021-08-08 (×2): qty 1

## 2021-08-08 MED ORDER — AMLODIPINE BESYLATE 10 MG PO TABS: 10.0000 mg | ORAL_TABLET | Freq: Every evening | ORAL | Status: DC

## 2021-08-08 MED ORDER — KETOROLAC TROMETHAMINE 15 MG/ML IJ SOLN
15.0000 mg | Freq: Three times a day (TID) | INTRAMUSCULAR | Status: DC | PRN
  Filled 2021-08-08: qty 1

## 2021-08-08 MED ORDER — CALCIUM ACETATE (PHOS BINDER) 667 MG PO CAPS
2668.0000 mg | ORAL_CAPSULE | Freq: Three times a day (TID) | ORAL | Status: DC
  Administered 2021-08-09 – 2021-08-18 (×19): 2668 mg via ORAL
  Filled 2021-08-08 (×22): qty 4

## 2021-08-08 MED ORDER — NICOTINE 21 MG/24HR TD PT24
21.0000 mg | MEDICATED_PATCH | Freq: Every day | TRANSDERMAL | Status: DC
  Administered 2021-08-08 – 2021-08-31 (×19): 21 mg via TRANSDERMAL
  Filled 2021-08-08 (×21): qty 1

## 2021-08-08 MED ORDER — HYDROMORPHONE HCL 1 MG/ML IJ SOLN
1.0000 mg | Freq: Once | INTRAMUSCULAR | Status: AC
  Administered 2021-08-08: 1 mg via INTRAVENOUS
  Filled 2021-08-08: qty 1

## 2021-08-08 MED ORDER — HEPARIN SODIUM (PORCINE) 1000 UNIT/ML DIALYSIS
1000.0000 [IU] | INTRAMUSCULAR | Status: DC | PRN
  Filled 2021-08-08: qty 1

## 2021-08-08 MED ORDER — ALTEPLASE 2 MG IJ SOLR
2.0000 mg | Freq: Once | INTRAMUSCULAR | Status: DC | PRN
Start: 2021-08-08 — End: 2021-08-09
  Filled 2021-08-08: qty 2

## 2021-08-08 MED ORDER — CYCLOBENZAPRINE HCL 10 MG PO TABS
5.0000 mg | ORAL_TABLET | Freq: Three times a day (TID) | ORAL | Status: DC | PRN
  Administered 2021-08-08 – 2021-08-10 (×4): 5 mg via ORAL
  Filled 2021-08-08 (×4): qty 1

## 2021-08-08 MED ORDER — DEXAMETHASONE SODIUM PHOSPHATE 10 MG/ML IJ SOLN
10.0000 mg | Freq: Once | INTRAMUSCULAR | Status: AC
  Administered 2021-08-08: 10 mg via INTRAVENOUS
  Filled 2021-08-08: qty 1

## 2021-08-08 MED ORDER — OXYCODONE-ACETAMINOPHEN 5-325 MG PO TABS
1.0000 | ORAL_TABLET | ORAL | Status: DC | PRN
  Administered 2021-08-08 – 2021-08-10 (×8): 1 via ORAL
  Filled 2021-08-08 (×8): qty 1

## 2021-08-08 MED ORDER — SODIUM CHLORIDE 0.9 % IV SOLN: 100.0000 mL | INTRAVENOUS | Status: DC | PRN

## 2021-08-08 MED ORDER — LIDOCAINE HCL (PF) 1 % IJ SOLN
5.0000 mL | INTRAMUSCULAR | Status: DC | PRN
  Filled 2021-08-08: qty 5

## 2021-08-08 MED ORDER — APIXABAN 2.5 MG PO TABS
2.5000 mg | ORAL_TABLET | Freq: Two times a day (BID) | ORAL | Status: DC
  Administered 2021-08-08 – 2021-08-24 (×28): 2.5 mg via ORAL
  Filled 2021-08-08 (×31): qty 1

## 2021-08-08 MED ORDER — CHLORHEXIDINE GLUCONATE CLOTH 2 % EX PADS
6.0000 | MEDICATED_PAD | Freq: Every day | CUTANEOUS | Status: DC
  Administered 2021-08-09 – 2021-09-01 (×20): 6 via TOPICAL
  Filled 2021-08-08: qty 6

## 2021-08-08 MED ORDER — ACETAMINOPHEN 325 MG PO TABS
650.0000 mg | ORAL_TABLET | Freq: Four times a day (QID) | ORAL | Status: DC | PRN
  Administered 2021-08-08 – 2021-08-26 (×8): 650 mg via ORAL
  Filled 2021-08-08 (×9): qty 2

## 2021-08-08 MED ORDER — ONDANSETRON HCL 4 MG/2ML IJ SOLN
4.0000 mg | Freq: Three times a day (TID) | INTRAMUSCULAR | Status: DC | PRN
  Administered 2021-08-11 – 2021-08-22 (×2): 4 mg via INTRAVENOUS
  Filled 2021-08-08 (×3): qty 2

## 2021-08-08 MED ORDER — LIDOCAINE 5 % EX PTCH
1.0000 | MEDICATED_PATCH | CUTANEOUS | Status: DC
  Administered 2021-08-08 – 2021-08-09 (×2): 1 via TRANSDERMAL
  Filled 2021-08-08 (×3): qty 1

## 2021-08-08 NOTE — ED Triage Notes (Signed)
Pt to triage via w/c, tearful; st lower back pain x wk; denies any injury; denies radiating pain, denies accomp symptoms; st hx sciatica

## 2021-08-08 NOTE — ED Notes (Signed)
See triage note  Presents with lower back pain  States pain started about  1 week ago    States pain has gotten worse  Pt is tearful on arrival to room

## 2021-08-08 NOTE — ED Notes (Signed)
Report called to [facility] RN    Pt moved to room 37 via stretcher

## 2021-08-08 NOTE — Progress Notes (Signed)
[facility]  ROUNDING NOTE   Subjective:   [name] [name] is a 60 y.o. male with medical conditions including anemia, CHF, hypertension, and ESRD on dialysis. Patient has presented to ED with progressive back pain.   Patient states he has always had back pain but it has increased in the past few days. Denies known precipitating factors. He was able to complete dialysis yesterday at outpatient clinic, but needed help getting into his vehicle afterwards. Denies diarrhea and vomiting. Denies shortness of breath. Currently on 2L due to low oxygen saturation. Chest X-ray of lumbar spine shows multilevel degenerative changes and stenosis, greatest at L4-L5.   Patient is known to our practice and receives dialysis at [facility] supervised by Dr Juleen China. We have been consulted to manage dialysis treatments.    Objective:  Vital signs in last 24 hours:  Temp:  [98.4 F (36.9 C)] 98.4 F (36.9 C) (08/30 0624) Pulse Rate:  [78-86] 78 (08/30 1433) Resp:  [18-30] 18 (08/30 1433) BP: (120-124)/(66-83) 123/66 (08/30 1433) SpO2:  [99 %-100 %] 99 % (08/30 1433) Weight:  [91 kg] 91 kg (08/30 0625)  Weight change:  Filed Weights   08/08/21 0625  Weight: 91 kg    Intake/Output: No intake/output data recorded.   Intake/Output this shift:  No intake/output data recorded.  Physical Exam: General: NAD, resting in bed  Head: Normocephalic, atraumatic. Moist oral mucosal membranes  Eyes: Anicteric  Lungs:  Clear to auscultation, normal effort, O2 2L  Heart: Regular rate and rhythm  Abdomen:  Soft, nontender,   Extremities:  1+ peripheral edema.  Neurologic: Alert, moving all four extremities  Skin: No lesions  Access: Rt AVG    Basic Metabolic Panel: Recent Labs  Lab 08/08/21 0816  NA 139  K 4.6  CL 101  CO2 28  GLUCOSE 93  BUN 49*  CREATININE 8.43*  CALCIUM 9.7    Liver Function Tests: Recent Labs  Lab 08/08/21 0816  AST 16  ALT 13  ALKPHOS 79  BILITOT  1.1  PROT 6.9  ALBUMIN 2.6*   No results for input(s): LIPASE, AMYLASE in the last 168 hours. No results for input(s): AMMONIA in the last 168 hours.  CBC: Recent Labs  Lab 08/08/21 0816  WBC 9.3  NEUTROABS 7.6  HGB 9.6*  HCT 30.3*  MCV 95.3  PLT 136*    Cardiac Enzymes: No results for input(s): CKTOTAL, CKMB, CKMBINDEX, TROPONINI in the last 168 hours.  BNP: Invalid input(s): POCBNP  CBG: No results for input(s): GLUCAP in the last 168 hours.  Microbiology: Results for orders placed or performed during the hospital encounter of 08/08/21  Resp Panel by RT-PCR (Flu A&B, Covid) Nasopharyngeal Swab     Status: None   Collection Time: 08/08/21  1:22 PM   Specimen: Nasopharyngeal Swab; Nasopharyngeal(NP) swabs in vial transport medium  Result Value Ref Range Status   SARS Coronavirus 2 by RT PCR NEGATIVE NEGATIVE Final    Comment: (NOTE) SARS-CoV-2 target nucleic acids are NOT DETECTED.  The SARS-CoV-2 RNA is generally detectable in upper respiratory specimens during the acute phase of infection. The lowest concentration of SARS-CoV-2 viral copies this assay can detect is 138 copies/mL. A negative result does not preclude SARS-Cov-2 infection and should not be used as the sole basis for treatment or other patient management decisions. A negative result may occur with  improper specimen collection/handling, submission of specimen other than nasopharyngeal swab, presence of viral mutation(s) within the areas targeted by  this assay, and inadequate number of viral copies(<138 copies/mL). A negative result must be combined with clinical observations, patient history, and epidemiological information. The expected result is Negative.  Fact Sheet for Patients:  EntrepreneurPulse.com.au  Fact Sheet for Healthcare Providers:  IncredibleEmployment.be  This test is no t yet approved or cleared by the [place] FDA and  has been  authorized for detection and/or diagnosis of SARS-CoV-2 by FDA under an Emergency Use Authorization (EUA). This EUA will remain  in effect (meaning this test can be used) for the duration of the COVID-19 declaration under Section 564(b)(1) of the Act, 21 U.S.C.section 360bbb-3(b)(1), unless the authorization is terminated  or revoked sooner.       Influenza A by PCR NEGATIVE NEGATIVE Final   Influenza B by PCR NEGATIVE NEGATIVE Final    Comment: (NOTE) The Xpert Xpress SARS-CoV-2/FLU/RSV plus assay is intended as an aid in the diagnosis of influenza from Nasopharyngeal swab specimens and should not be used as a sole basis for treatment. Nasal washings and aspirates are unacceptable for Xpert Xpress SARS-CoV-2/FLU/RSV testing.  Fact Sheet for Patients: EntrepreneurPulse.com.au  Fact Sheet for Healthcare Providers: IncredibleEmployment.be  This test is not yet approved or cleared by the [place] FDA and has been authorized for detection and/or diagnosis of SARS-CoV-2 by FDA under an Emergency Use Authorization (EUA). This EUA will remain in effect (meaning this test can be used) for the duration of the COVID-19 declaration under Section 564(b)(1) of the Act, 21 U.S.C. section 360bbb-3(b)(1), unless the authorization is terminated or revoked.  Performed at [facility], [address] [place], [place] [address]     Coagulation Studies: No results for input(s): LABPROT, INR in the last 72 hours.  Urinalysis: No results for input(s): COLORURINE, LABSPEC, PHURINE, GLUCOSEU, HGBUR, BILIRUBINUR, KETONESUR, PROTEINUR, UROBILINOGEN, NITRITE, LEUKOCYTESUR in the last 72 hours.  Invalid input(s): APPERANCEUR    Imaging: CT Lumbar Spine Wo Contrast  Result Date: 08/08/2021 CLINICAL DATA:  Low back pain, increased fracture risk EXAM: CT LUMBAR SPINE WITHOUT CONTRAST TECHNIQUE: Multidetector CT imaging of the lumbar spine was  performed without intravenous contrast administration. Multiplanar CT image reconstructions were also generated. COMPARISON:  None. FINDINGS: Segmentation: 5 lumbar type vertebrae. Alignment: Grade 1 anterolisthesis at L4-L5. 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 Degenerative plate irregularity and Schmorl's nodes. Vertebral body heights are unchanged. Paraspinal and other soft tissues: Partially imaged small volume free fluid in the dependent pelvis. Colonic diverticulosis. Atrophic horseshoe kidney with multiple cysts. Aortic atherosclerosis. Multiple collateral vessels in the posterior abdominal wall. Disc levels: L1-L2: Facet hypertrophy. No significant canal or foraminal stenosis. L2-L3: Disc bulge. Facet hypertrophy. No significant canal or foraminal stenosis. L3-L4: Disc bulge. Facet hypertrophy. No significant canal or foraminal stenosis. L4-L5: Anterolisthesis with uncovering of disc bulge. Facet hypertrophy. Ligamentum flavum thickening. Probable moderate canal stenosis. Bilateral foraminal stenosis, left greater than right. L5-S1: Disc bulge with endplate osteophytic ridging. Facet hypertrophy. Ligamentum flavum thickening. No significant canal stenosis. Bilateral foraminal stenosis. IMPRESSION: No acute osseous abnormality. Multilevel degenerative changes as detailed above. Canal and foraminal stenosis are greatest at L4-L5. Partially imaged nonspecific small volume free fluid in the dependent pelvis. Electronically Signed   By: Macy Mis M.D.   On: 08/08/2021 09:43     Medications:     lidocaine  1 patch Transdermal Q24H   nicotine  21 mg Transdermal Daily   acetaminophen, cyclobenzaprine, hydrALAZINE, morphine injection, ondansetron (ZOFRAN) IV, oxyCODONE-acetaminophen  Assessment/ Plan:  [name] [name]  is a 60 y.o.  male with medical conditions including anemia, CHF, hypertension, and ESRD on dialysis. Patient has presented to ED with  progressive back pain.  [facility]/MWF/Rt AVG  Anemia of chronic kidney disease Lab Results  Component Value Date   HGB 9.6 (L) 08/08/2021  Hgb at goal  2. End stage renal disease on dialysis Will maintain outpatient schedule if possible Treatment scheduled for tomorrow  3. Secondary Hyperparathyroidism:  Lab Results  Component Value Date   PTH 583.2 (H) 12/11/2012   CALCIUM 9.7 08/08/2021   CAION 1.27 (H) 12/26/2012   PHOS 7.0 (H) 04/16/2019  Calcium at goal, phosphorus elevated Calcium acetate with meals outpatient     LOS: 0   8/30/20224:09 PM

## 2021-08-08 NOTE — ED Provider Notes (Signed)
[facility] Emergency Department Provider Note  ____________________________________________   Event Date/Time   First MD Initiated Contact with Patient 08/08/21 [id number]     (approximate)  I have reviewed the triage vital signs and the nursing notes.   HISTORY  Chief Complaint Back Pain    HPI [name] [name] is a 59 y.o. male presents emergency department with severe low back pain.  Patient is a dialysis patient and goes Monday Wednesday Friday.  He did have dialysis yesterday.  States at dialysis they had to help him into his truck.  Took some pain medication at home.  States pain medication did not help.  He has had increasing pain.  On arrival he had to be lifted out of his vehicle by security and nursing staff.  Patient states he is having severe pain.  No abdominal pain.  Just lower back pain.  Past Medical History:  Diagnosis Date   Anemia of chronic disease    BL Hgb ~10   CHF (congestive heart failure) ([place])    pt denies 04/01/14   Diverticulitis    End-stage renal disease on hemodialysis (HCC)    HD m/w/f - right arm graft   History of blood transfusion    Hypertension    Pneumonia    hx of   Pneumonia    Polysubstance abuse ([place])    Renal insufficiency    Secondary hyperparathyroidism ([place])    Sleep disturbance    Tobacco abuse     Patient Active Problem List   Diagnosis Date Noted   Anemia in ESRD (end-stage renal disease) ([place]) 08/08/2021   Lower back pain 08/08/2021   Rectal bleeding 04/16/2019   Diverticulitis large intestine 12/05/2017   Diverticulitis of colon 04/24/2015   Community acquired pneumonia 04/18/2015   Sepsis ([place]) 04/16/2015   Shortness of breath 07/07/2013   Acidosis, metabolic, with respiratory acidosis 04/23/2013   Acute post-thoracotomy pain 04/23/2013   Hemorrhagic shock ([place]) 04/23/2013   S/P sympathectomy 04/23/2013   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   Persistent air leak 03/18/2013    Pneumothorax, right 03/18/2013   S/P thoracotomy 03/04/2013   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 ([place]) 01/17/2013   Bacteremia 01/06/2013   Benign neoplasm of colon 12/16/2012   Diverticulosis of colon (without mention of hemorrhage) 12/16/2012   ESCRF (end stage chronic renal failure) ([place]) 12/12/2012   H/O: hypertension 12/12/2012   Pleural effusion, right 12/12/2012   History of prolonged Q-T interval on ECG 12/12/2012   Hyperkalemia 12/11/2012   Acute blood loss anemia 12/11/2012   Sinus tachycardia, resolved with PRBC resuscitation 12/11/2012   Lower GI bleed 12/11/2012   End-stage renal disease on hemodialysis ([place])    Secondary hyperparathyroidism, renal ([place])    Hypotension    Polysubstance abuse ([place])    Anemia of chronic disease    Tobacco abuse     Past Surgical History:  Procedure Laterality Date   APPENDECTOMY     CHEST TUBE INSERTION  02/2013   COLONOSCOPY  12/16/2012   Procedure: COLONOSCOPY;  Surgeon: Milus Banister, MD;  Location: [facility];  Service: Endoscopy;  Laterality: N/A;   PLEURAL EFFUSION DRAINAGE  12/18/2012   Procedure: DRAINAGE OF PLEURAL EFFUSION;  Surgeon: Melrose Nakayama, MD;  Location: [facility];  Service: Thoracic;  Laterality: Right;   surgery for horseshoe kidney     TALC PLEURODESIS Right 02/05/2013   Procedure: Pietro Cassis;  Surgeon: Melrose Nakayama, MD;  Location:  [facility] OR;  Service: Thoracic;  Laterality: Right;   TEE WITHOUT CARDIOVERSION N/A 01/16/2013   Procedure: TRANSESOPHAGEAL ECHOCARDIOGRAM (TEE);  Surgeon: Sanda Klein, MD;  Location: [facility];  Service: Cardiovascular;  Laterality: N/A;   VIDEO ASSISTED THORACOSCOPY  12/18/2012   Procedure: VIDEO ASSISTED THORACOSCOPY;  Surgeon: Melrose Nakayama, MD;  Location: [facility];  Service: Thoracic;  Laterality: Right;  pleural peel   VIDEO ASSISTED THORACOSCOPY (VATS)/THOROCOTOMY Right 02/19/2013   Procedure: VIDEO ASSISTED THORACOSCOPY (VATS)/THOROCOTOMY;   Surgeon: Melrose Nakayama, MD;  Location: [facility];  Service: Thoracic;  Laterality: Right;  Right Video Assisted Thoracoscopy, Right Thoracotomy, Decortication,    VIDEO BRONCHOSCOPY WITH INSERTION OF INTERBRONCHIAL VALVE (IBV)  01/09/2013   Procedure: VIDEO BRONCHOSCOPY WITH INSERTION OF INTERBRONCHIAL VALVE (IBV);  Surgeon: Melrose Nakayama, MD;  Location: [facility];  Service: Thoracic;  Laterality: N/A;  placement of 2 Intrabronchial valves in right lower lobe   VIDEO BRONCHOSCOPY WITH INSERTION OF INTERBRONCHIAL VALVE (IBV) Right 02/19/2013   Procedure: VIDEO BRONCHOSCOPY WITH INSERTION OF INTERBRONCHIAL VALVE (IBV);  Surgeon: Melrose Nakayama, MD;  Location: [facility];  Service: Thoracic;  Laterality: Right;  removal of two interbronchial valves right chest    Prior to Admission medications   Medication Sig Start Date End Date Taking? Authorizing Provider  acetaminophen (TYLENOL) 500 MG tablet Take 1,000 mg by mouth every 8 (eight) hours as needed for moderate pain.   Yes [provider]  amLODipine (NORVASC) 10 MG tablet Take 10 mg by mouth every evening. 02/19/19  Yes [provider]  apixaban (ELIQUIS) 5 MG TABS tablet Take 5 mg by mouth daily.   Yes [provider]  calcium acetate (PHOSLO) 667 MG capsule Take 2,668 mg by mouth 3 (three) times daily with meals.    Yes [provider]  promethazine (PHENERGAN) 25 MG tablet Take 25 mg by mouth every 8 (eight) hours as needed for nausea/vomiting.   Yes [provider]    Allergies Other and Tape  Family History  Problem Relation Age of Onset   Thyroid disease Mother    Renal Disease Maternal Grandmother    Diabetes Maternal Grandmother    Hypertension Maternal Aunt    Hypertension Maternal Uncle     Social History Social History   Tobacco Use   Smoking status: Every Day    Packs/day: 6.00    Years: 20.00    Pack years: 120.00    Types: Cigarettes   Smokeless tobacco: Current   Vaping Use   Vaping Use: Never used  Substance Use Topics   Alcohol use: Yes    Alcohol/week: 3.0 standard drinks    Types: 3 Cans of beer per week    Comment: occasional   Drug use: Yes    Types: Marijuana    Comment: remote cocaine abuse, ongoing marijuana use.    Review of Systems  Constitutional: No fever/chills Eyes: No visual changes. ENT: No sore throat. Respiratory: Denies cough Cardiovascular: Denies chest pain Gastrointestinal: Denies abdominal pain Genitourinary: Negative for dysuria. Musculoskeletal: Positive for back pain. Skin: Negative for rash. Psychiatric: no mood changes,     ____________________________________________   PHYSICAL EXAM:  VITAL SIGNS: ED Triage Vitals  Enc Vitals Group     BP 08/08/21 0624 124/83     Pulse Rate 08/08/21 0624 86     Resp 08/08/21 0624 (!) 30     Temp 08/08/21 0624 98.4 F (36.9 C)     Temp Source 08/08/21 0624 Oral  SpO2 08/08/21 0624 100 %     Weight 08/08/21 0625 200 lb 9.9 oz (91 kg)     Height 08/08/21 0625 6\' 1"  (1.854 m)     Head Circumference --      Peak Flow --      Pain Score 08/08/21 0627 10     Pain Loc --      Pain Edu? --      Excl. in [place]? --     Constitutional: Alert and oriented. Well appearing and in no acute distress. Eyes: Conjunctivae are normal.  Head: Atraumatic. Nose: No congestion/rhinnorhea. Mouth/Throat: Mucous membranes are moist.   Neck:  supple no lymphadenopathy noted Cardiovascular: Normal rate, regular rhythm. Heart sounds are normal Respiratory: Normal respiratory effort.  No retractions, lungs c t a  Abd: soft nontender bs normal all 4 quad GU: deferred Musculoskeletal: FROM all extremities, warm and well perfused, lumbar spines tender, patient unable to stand on his own Neurologic:  Normal speech and language.  Skin:  Skin is warm, dry and intact. No rash noted. Psychiatric: Mood and affect are normal. Speech and behavior are  normal.  ____________________________________________   LABS (all labs ordered are listed, but only abnormal results are displayed)  Labs Reviewed  COMPREHENSIVE METABOLIC PANEL - Abnormal; Notable for the following components:      Result Value   BUN 49 (*)    Creatinine, Ser 8.43 (*)    Albumin 2.6 (*)    GFR, Estimated 7 (*)    All other components within normal limits  CBC WITH DIFFERENTIAL/PLATELET - Abnormal; Notable for the following components:   RBC 3.18 (*)    Hemoglobin 9.6 (*)    HCT 30.3 (*)    RDW 16.7 (*)    Platelets 136 (*)    All other components within normal limits  RESP PANEL BY RT-PCR (FLU A&B, COVID) [id number]   ____________________________________________   ____________________________________________  RADIOLOGY  CT lumbar spine  ____________________________________________   PROCEDURES  Procedure(s) performed: No  Procedures    ____________________________________________   INITIAL IMPRESSION / ASSESSMENT AND PLAN / ED COURSE  Pertinent labs & imaging results that were available during my care of the patient were reviewed by me and considered in my medical decision making (see chart for details).   The patient is a 60 year old male dialysis patient, presents emergency department with increasing low back pain.  See HPI physical exam shows patient to appear stable although he is in a great deal of pain  DDx: Subacute spinal fracture, metastatic disease, osteoporosis, herniated disc, dissection  Patient was given morphine 4 mg IV along with Zofran.  He is much more comfortable after the pain medication.  CT lumbar spine ordered  Patient's labs are reassuring, comprehensive metabolic panel is normal per his trend due to the end-stage kidney disease, CBC does have a low H&H however this is most likely due to his recent dialysis.  CT lumbar spine does not show any acute abnormalities.  Patient was given morphine 4 mg IV, and now Dilaudid  1 mg IV.  He is still uncomfortable and unable to bear weight without help.  Feel that this criteria along with him living alone would be cause for admission.  Patient is agreeable to admission.  Consult to hospitalist.  Dr Porfirio Mylar will be admitting for pain control, he is aware of the patient's dialysis routine is Monday Wednesday Friday and he will need dialysis tomorrow.  Admitted in stable condition.     [name] [name]  [name] [name] was evaluated in Emergency Department on 08/08/2021 for the symptoms described in the history of present illness. He was evaluated in the context of the global COVID-19 pandemic, which necessitated consideration that the patient might be at risk for infection with the SARS-CoV-2 virus that causes COVID-19. Institutional protocols and algorithms that pertain to the evaluation of patients at risk for COVID-19 are in a state of rapid change based on information released by regulatory bodies including the CDC and federal and state organizations. These policies and algorithms were followed during the patient's care in the ED.    As part of my medical decision making, I reviewed the following data within the [facility] notes reviewed and incorporated, Labs reviewed , Old chart reviewed, Radiograph reviewed , Discussed with admitting physician , Notes from prior ED visits, and [place] Controlled Substance Database  ____________________________________________   FINAL CLINICAL IMPRESSION(S) / ED DIAGNOSES  Final diagnoses:  Acute midline low back pain without sciatica  At risk for inadequate pain control      NEW MEDICATIONS STARTED DURING THIS VISIT:  New Prescriptions   No medications on file     Note:  This document was prepared using Dragon voice recognition software and may include unintentional dictation errors.    Versie Starks, PA-C 08/08/21 1333    Merlyn Lot, MD 08/08/21 [phone]

## 2021-08-08 NOTE — ED Notes (Signed)
Michelle RN aware of assigned bed 

## 2021-08-08 NOTE — ED Notes (Signed)
Attempted to ambulate pt in room  Gait is very unsteady

## 2021-08-08 NOTE — H&P (Addendum)
History and Physical    [name] [name] JME:[id number] DOB: [birth year] DOA: 08/08/2021  Referring MD/NP/PA:   PCP: Pcp, No   Patient coming from:  The patient is coming from home.  At baseline, pt is independent for most of ADL.        Chief Complaint: lower back pain  HPI: [name] [name] is a 60 y.o. male with medical history significant of ESRD-HD (MWF), hypertension, tobacco abuse, polysubstance abuse, diverticulitis, anemia, jugular vein thrombosis, GI bleeding, right pneumothorax, who presents with lower back pain.  Patient states that he has been having lower back pain for more than 1 week, which has been progressively worsening.  The pain is constant, sharp, 10 out of 10 in severity today, nonradiating.  The pain is so severe that he cannot walk.  On arrival he had to be lifted out of his vehicle by security and nursing staff.  Denies loss control of bladder or bowel movement.  No leg numbness or tinglings.  Patient denies any chest pain, cough, shortness of breath.  No fever or chills.  No nausea vomiting, diarrhea or abdominal pain.  Patient had dialysis yesterday.   ED Course: pt was found to have WBC 9.3, negative COVID PCR, potassium 4.6, bicarbonate 28, creatinine 8.43, BUN 49, temperature normal, blood pressure 120/80, heart rate 80, RR 20, oxygen saturation 100% on room air.  Patient is placed on MedSurg bed for observation  CT of lumbar spine: No acute osseous abnormality. Multilevel degenerative changes as detailed above. Canal and foraminal stenosis are greatest at L4-L5.   Partially imaged nonspecific small volume free fluid in the dependent pelvis.   Review of Systems:   General: no fevers, chills, no body weight gain, has fatigue HEENT: no blurry vision, hearing changes or sore throat Respiratory: no dyspnea, coughing, wheezing CV: no chest pain, no palpitations GI: no nausea, vomiting, abdominal pain, diarrhea, constipation GU: no dysuria, burning on urination,  increased urinary frequency, hematuria  Ext: no leg edema Neuro: no unilateral weakness, numbness, or tingling, no vision change or hearing loss Skin: no rash, no skin tear. MSK: No muscle spasm, no deformity, has severe lower back pain Heme: No easy bruising.  Travel history: No recent long distant travel.  Allergy:  Allergies  Allergen Reactions   Other     Other reaction(s): Other (See Comments) blister   Tape Other (See Comments)    Plastic Tape Causes blister    Past Medical History:  Diagnosis Date   Anemia of chronic disease    BL Hgb ~10   CHF (congestive heart failure) ([place])    pt denies 04/01/14   Diverticulitis    End-stage renal disease on hemodialysis (HCC)    HD m/w/f - right arm graft   History of blood transfusion    Hypertension    Pneumonia    hx of   Pneumonia    Polysubstance abuse ([place])    Renal insufficiency    Secondary hyperparathyroidism ([place])    Sleep disturbance    Tobacco abuse     Past Surgical History:  Procedure Laterality Date   APPENDECTOMY     CHEST TUBE INSERTION  02/2013   COLONOSCOPY  12/16/2012   Procedure: COLONOSCOPY;  Surgeon: Milus Banister, MD;  Location: [facility];  Service: Endoscopy;  Laterality: N/A;   PLEURAL EFFUSION DRAINAGE  12/18/2012   Procedure: DRAINAGE OF PLEURAL EFFUSION;  Surgeon: Melrose Nakayama, MD;  Location: [facility];  Service: Thoracic;  Laterality: Right;  surgery for horseshoe kidney     TALC PLEURODESIS Right 02/05/2013   Procedure: Pietro Cassis;  Surgeon: Melrose Nakayama, MD;  Location: [facility];  Service: Thoracic;  Laterality: Right;   TEE WITHOUT CARDIOVERSION N/A 01/16/2013   Procedure: TRANSESOPHAGEAL ECHOCARDIOGRAM (TEE);  Surgeon: Sanda Klein, MD;  Location: [facility];  Service: Cardiovascular;  Laterality: N/A;   VIDEO ASSISTED THORACOSCOPY  12/18/2012   Procedure: VIDEO ASSISTED THORACOSCOPY;  Surgeon: Melrose Nakayama, MD;  Location: [facility];  Service: Thoracic;  Laterality:  Right;  pleural peel   VIDEO ASSISTED THORACOSCOPY (VATS)/THOROCOTOMY Right 02/19/2013   Procedure: VIDEO ASSISTED THORACOSCOPY (VATS)/THOROCOTOMY;  Surgeon: Melrose Nakayama, MD;  Location: [facility];  Service: Thoracic;  Laterality: Right;  Right Video Assisted Thoracoscopy, Right Thoracotomy, Decortication,    VIDEO BRONCHOSCOPY WITH INSERTION OF INTERBRONCHIAL VALVE (IBV)  01/09/2013   Procedure: VIDEO BRONCHOSCOPY WITH INSERTION OF INTERBRONCHIAL VALVE (IBV);  Surgeon: Melrose Nakayama, MD;  Location: [place];  Service: Thoracic;  Laterality: N/A;  placement of 2 Intrabronchial valves in right lower lobe   VIDEO BRONCHOSCOPY WITH INSERTION OF INTERBRONCHIAL VALVE (IBV) Right 02/19/2013   Procedure: VIDEO BRONCHOSCOPY WITH INSERTION OF INTERBRONCHIAL VALVE (IBV);  Surgeon: Melrose Nakayama, MD;  Location[facility];  Service: Thoracic;  Laterality: Right;  removal of two interbronchial valves right chest    Social History:  reports that he has been smoking cigarettes. He has a 120.00 pack-year smoking history. He uses smokeless tobacco. He reports current alcohol use of about 3.0 standard drinks per week. He reports current drug use. Drugs: Marijuana and Cocaine.  Family History:  Family History  Problem Relation Age of Onset   Thyroid disease Mother    Renal Disease Maternal Grandmother    Diabetes Maternal Grandmother    Hypertension Maternal Aunt    Hypertension Maternal Uncle      Prior to Admission medications   Medication Sig Start Date End Date Taking? Authorizing Provider  acetaminophen (TYLENOL) 500 MG tablet Take 1,000 mg by mouth every 8 (eight) hours as needed for moderate pain.    [provider]  amLODipine (NORVASC) 10 MG tablet Take 10 mg by mouth every evening. 02/19/19   [provider]  amoxicillin-clavulanate (AUGMENTIN) 500-125 MG tablet Take 1 tablet (500 mg total) by mouth 3 (three) times daily. On days of dialysis please take your dose after you  have completed dialysis. 11/13/19   Harvest Dark, MD  calcium acetate (PHOSLO) 667 MG capsule Take 2,668 mg by mouth 3 (three) times daily with meals.     [provider]  cinacalcet (SENSIPAR) 90 MG tablet Take 90 mg by mouth daily.    [provider]  clopidogrel (PLAVIX) 75 MG tablet Take 75 mg by mouth daily. 03/29/19   [provider]  diphenhydrAMINE (BENADRYL) 25 mg capsule Take 25 mg by mouth every 6 (six) hours as needed.     [provider]  metoprolol succinate (TOPROL-XL) 50 MG 24 hr tablet Take 50 mg by mouth daily. Take with or immediately following a meal.    [provider]  ondansetron (ZOFRAN ODT) 4 MG disintegrating tablet Take 1 tablet (4 mg total) by mouth every 8 (eight) hours as needed for nausea or vomiting. 11/13/19   Harvest Dark, MD  promethazine (PHENERGAN) 12.5 MG tablet TAKE 1 TAB BY MOUTH ONCE A DAY AS NEEDED FOR VOMITING. 04/10/19   [provider]  sodium polystyrene (KAYEXALATE) 15 GM/60ML suspension Take 60 mLs (15  g total) by mouth 2 (two) times daily. 12/05/19   Harvest Dark, MD    Physical Exam: Vitals:   08/08/21 4403 08/08/21 0625 08/08/21 0947 08/08/21 1433  BP: 124/83  120/80 123/66  Pulse: 86  80 78  Resp: (!) 30  20 18   Temp: 98.4 F (36.9 C)     TempSrc: Oral     SpO2: 100%  100% 99%  Weight:  91 kg    Height:  6\' 1"  (1.854 m)     General: Not in acute distress HEENT:       Eyes: PERRL, EOMI, no scleral icterus.       ENT: No discharge from the ears and nose, no pharynx injection, no tonsillar enlargement.        Neck: No JVD, no bruit, no mass felt. Heme: No neck lymph node enlargement. Cardiac: S1/S2, RRR, No murmurs, No gallops or rubs. Respiratory: No rales, wheezing, rhonchi or rubs. GI: Soft, nondistended, nontender, no rebound pain, no organomegaly, BS present. GU: No hematuria Ext: has trace leg edema bilaterally. 1+DP/PT pulse bilaterally. Musculoskeletal: No  joint deformities, No joint redness or warmth. Has tenderness in the midline of lower back Skin: No rashes.  Neuro: Alert, oriented X3, cranial nerves II-XII grossly intact, moves all extremities normally. Psych: Patient is not psychotic, no suicidal or hemocidal ideation.  Labs on Admission: I have personally reviewed following labs and imaging studies  CBC: Recent Labs  Lab 08/08/21 0816  WBC 9.3  NEUTROABS 7.6  HGB 9.6*  HCT 30.3*  MCV 95.3  PLT 474*   Basic Metabolic Panel: Recent Labs  Lab 08/08/21 0816  NA 139  K 4.6  CL 101  CO2 28  GLUCOSE 93  BUN 49*  CREATININE 8.43*  CALCIUM 9.7   GFR: Estimated Creatinine Clearance: 10.9 mL/min (A) (by C-G formula based on SCr of 8.43 mg/dL (H)). Liver Function Tests: Recent Labs  Lab 08/08/21 0816  AST 16  ALT 13  ALKPHOS 79  BILITOT 1.1  PROT 6.9  ALBUMIN 2.6*   No results for input(s): LIPASE, AMYLASE in the last 168 hours. No results for input(s): AMMONIA in the last 168 hours. Coagulation Profile: No results for input(s): INR, PROTIME in the last 168 hours. Cardiac Enzymes: No results for input(s): CKTOTAL, CKMB, CKMBINDEX, TROPONINI in the last 168 hours. BNP (last 3 results) No results for input(s): PROBNP in the last 8760 hours. HbA1C: No results for input(s): HGBA1C in the last 72 hours. CBG: No results for input(s): GLUCAP in the last 168 hours. Lipid Profile: No results for input(s): CHOL, HDL, LDLCALC, TRIG, CHOLHDL, LDLDIRECT in the last 72 hours. Thyroid Function Tests: No results for input(s): TSH, T4TOTAL, FREET4, T3FREE, THYROIDAB in the last 72 hours. Anemia Panel: No results for input(s): VITAMINB12, FOLATE, FERRITIN, TIBC, IRON, RETICCTPCT in the last 72 hours. Urine analysis: No results found for: COLORURINE, APPEARANCEUR, LABSPEC, PHURINE, GLUCOSEU, HGBUR, BILIRUBINUR, KETONESUR, PROTEINUR, UROBILINOGEN, NITRITE, LEUKOCYTESUR Sepsis  Labs: @LABRCNTIP (procalcitonin:4,lacticidven:4) ) Recent Results (from the past 240 hour(s))  Resp Panel by RT-PCR (Flu A&B, Covid) Nasopharyngeal Swab     Status: None   Collection Time: 08/08/21  1:22 PM   Specimen: Nasopharyngeal Swab; Nasopharyngeal(NP) swabs in vial transport medium  Result Value Ref Range Status   SARS Coronavirus 2 by RT PCR NEGATIVE NEGATIVE Final    Comment: (NOTE) SARS-CoV-2 target nucleic acids are NOT DETECTED.  The SARS-CoV-2 RNA is generally detectable in upper respiratory specimens during the acute phase of infection.  The lowest concentration of SARS-CoV-2 viral copies this assay can detect is 138 copies/mL. A negative result does not preclude SARS-Cov-2 infection and should not be used as the sole basis for treatment or other patient management decisions. A negative result may occur with  improper specimen collection/handling, submission of specimen other than nasopharyngeal swab, presence of viral mutation(s) within the areas targeted by this assay, and inadequate number of viral copies(<138 copies/mL). A negative result must be combined with clinical observations, patient history, and epidemiological information. The expected result is Negative.  Fact Sheet for Patients:  EntrepreneurPulse.com.au  Fact Sheet for Healthcare Providers:  IncredibleEmployment.be  This test is no t yet approved or cleared by the [place] FDA and  has been authorized for detection and/or diagnosis of SARS-CoV-2 by FDA under an Emergency Use Authorization (EUA). This EUA will remain  in effect (meaning this test can be used) for the duration of the COVID-19 declaration under Section 564(b)(1) of the Act, 21 U.S.C.section 360bbb-3(b)(1), unless the authorization is terminated  or revoked sooner.       Influenza A by PCR NEGATIVE NEGATIVE Final   Influenza B by PCR NEGATIVE NEGATIVE Final    Comment: (NOTE) The Xpert Xpress  SARS-CoV-2/FLU/RSV plus assay is intended as an aid in the diagnosis of influenza from Nasopharyngeal swab specimens and should not be used as a sole basis for treatment. Nasal washings and aspirates are unacceptable for Xpert Xpress SARS-CoV-2/FLU/RSV testing.  Fact Sheet for Patients: EntrepreneurPulse.com.au  Fact Sheet for Healthcare Providers: IncredibleEmployment.be  This test is not yet approved or cleared by the [place] FDA and has been authorized for detection and/or diagnosis of SARS-CoV-2 by FDA under an Emergency Use Authorization (EUA). This EUA will remain in effect (meaning this test can be used) for the duration of the COVID-19 declaration under Section 564(b)(1) of the Act, 21 U.S.C. section 360bbb-3(b)(1), unless the authorization is terminated or revoked.  Performed at [facility], [address] [place], [place] [address]      Radiological Exams on Admission: CT Lumbar Spine Wo Contrast  Result Date: 08/08/2021 CLINICAL DATA:  Low back pain, increased fracture risk EXAM: CT LUMBAR SPINE WITHOUT CONTRAST TECHNIQUE: Multidetector CT imaging of the lumbar spine was performed without intravenous contrast administration. Multiplanar CT image reconstructions were also generated. COMPARISON:  None. FINDINGS: Segmentation: 5 lumbar type vertebrae. Alignment: Grade 1 anterolisthesis at L4-L5. 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 Degenerative plate irregularity and Schmorl's nodes. Vertebral body heights are unchanged. Paraspinal and other soft tissues: Partially imaged small volume free fluid in the dependent pelvis. Colonic diverticulosis. Atrophic horseshoe kidney with multiple cysts. Aortic atherosclerosis. Multiple collateral vessels in the posterior abdominal wall. Disc levels: L1-L2: Facet hypertrophy. No significant canal or foraminal stenosis. L2-L3: Disc bulge. Facet  hypertrophy. No significant canal or foraminal stenosis. L3-L4: Disc bulge. Facet hypertrophy. No significant canal or foraminal stenosis. L4-L5: Anterolisthesis with uncovering of disc bulge. Facet hypertrophy. Ligamentum flavum thickening. Probable moderate canal stenosis. Bilateral foraminal stenosis, left greater than right. L5-S1: Disc bulge with endplate osteophytic ridging. Facet hypertrophy. Ligamentum flavum thickening. No significant canal stenosis. Bilateral foraminal stenosis. IMPRESSION: No acute osseous abnormality. Multilevel degenerative changes as detailed above. Canal and foraminal stenosis are greatest at L4-L5. Partially imaged nonspecific small volume free fluid in the dependent pelvis. Electronically Signed   By: Macy Mis M.D.   On: 08/08/2021 09:43     EKG: I have personally reviewed.  Sinus rhythm, QTC 421, low voltage,  LAE, poor R wave progression  Assessment/Plan Principal Problem:   Lower back pain Active Problems:   End-stage renal disease on hemodialysis (HCC)   Polysubstance abuse (HCC)   Tobacco abuse   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 (HCC)   Anemia in ESRD (end-stage renal disease) (HCC)   HTN (hypertension)   Lower back pain: CT scan showed multilevel degenerative and canal and foraminal stenosis, greatest at L4-L5. Pt does not have alarming symptoms.  No need to get MRI of lumbar spine, but the patient has severe pain, not able to walk, needs pain control and PT/OT.  -place in [facility] bed for observation -pt was given 10 mg of Decadron in ED -Pain control: Lidoderm, as needed morphine, Percocet, Tylenol -As needed Flexeril -PT/OT  End-stage renal disease on hemodialysis (MWF) -consulted Dr. Candiss Norse of renal for HD  Polysubstance abuse and tobacco abuse -Did counseling about importance of quitting substance use and tobacco use -Nicotine patch -Check UDS  Anemia in ESRD (end-stage renal disease) ([place]): Hemoglobin 10.0 on  02/23/2020 --> 9.6 slightly dropped. -Follow-up with CBC  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  -continue Eliquis --> changed from 5 mg daily to 2.5 mg bid per Dr. Keturah Barre recommendation.  HTN: -IV hydralazine as needed -Amlodipine   DVT ppx: SQ Heparin      Code Status: Full code Family Communication: not done, no family member is at bed side.      Disposition Plan:  Anticipate discharge back to previous environment Consults called: Dr. Candiss Norse of renal Admission status and Level of care: Med-Surg:   for obs  Status is: Observation  The patient remains OBS appropriate and will d/c before 2 midnights.  Dispo: The patient is from: Home              Anticipated d/c is to: Home              Patient currently is not medically stable to d/c.   Difficult to place patient No         Date of Service 08/08/2021    [facility]   If 7PM-7AM, please contact night-coverage [email or web address] 08/08/2021, 4:50 PM

## 2021-08-09 DIAGNOSIS — G928 Other toxic encephalopathy: Secondary | ICD-10-CM | POA: Diagnosis not present

## 2021-08-09 DIAGNOSIS — E669 Obesity, unspecified: Secondary | ICD-10-CM | POA: Diagnosis present

## 2021-08-09 DIAGNOSIS — F32A Depression, unspecified: Secondary | ICD-10-CM | POA: Diagnosis present

## 2021-08-09 DIAGNOSIS — Z7189 Other specified counseling: Secondary | ICD-10-CM | POA: Diagnosis not present

## 2021-08-09 DIAGNOSIS — K625 Hemorrhage of anus and rectum: Secondary | ICD-10-CM | POA: Diagnosis not present

## 2021-08-09 DIAGNOSIS — B952 Enterococcus as the cause of diseases classified elsewhere: Secondary | ICD-10-CM | POA: Diagnosis not present

## 2021-08-09 DIAGNOSIS — M4620 Osteomyelitis of vertebra, site unspecified: Secondary | ICD-10-CM | POA: Diagnosis not present

## 2021-08-09 DIAGNOSIS — F1910 Other psychoactive substance abuse, uncomplicated: Secondary | ICD-10-CM | POA: Diagnosis not present

## 2021-08-09 DIAGNOSIS — R7881 Bacteremia: Secondary | ICD-10-CM | POA: Diagnosis not present

## 2021-08-09 DIAGNOSIS — Z66 Do not resuscitate: Secondary | ICD-10-CM | POA: Diagnosis not present

## 2021-08-09 DIAGNOSIS — D631 Anemia in chronic kidney disease: Secondary | ICD-10-CM | POA: Diagnosis present

## 2021-08-09 DIAGNOSIS — I358 Other nonrheumatic aortic valve disorders: Secondary | ICD-10-CM | POA: Diagnosis not present

## 2021-08-09 DIAGNOSIS — M5440 Lumbago with sciatica, unspecified side: Secondary | ICD-10-CM | POA: Diagnosis not present

## 2021-08-09 DIAGNOSIS — I330 Acute and subacute infective endocarditis: Secondary | ICD-10-CM | POA: Diagnosis present

## 2021-08-09 DIAGNOSIS — I82C19 Acute embolism and thrombosis of unspecified internal jugular vein: Secondary | ICD-10-CM | POA: Diagnosis not present

## 2021-08-09 DIAGNOSIS — K6812 Psoas muscle abscess: Secondary | ICD-10-CM | POA: Diagnosis present

## 2021-08-09 DIAGNOSIS — R4182 Altered mental status, unspecified: Secondary | ICD-10-CM | POA: Diagnosis not present

## 2021-08-09 DIAGNOSIS — Z95828 Presence of other vascular implants and grafts: Secondary | ICD-10-CM | POA: Diagnosis not present

## 2021-08-09 DIAGNOSIS — N2581 Secondary hyperparathyroidism of renal origin: Secondary | ICD-10-CM | POA: Diagnosis present

## 2021-08-09 DIAGNOSIS — F05 Delirium due to known physiological condition: Secondary | ICD-10-CM | POA: Diagnosis not present

## 2021-08-09 DIAGNOSIS — M545 Low back pain, unspecified: Secondary | ICD-10-CM | POA: Diagnosis present

## 2021-08-09 DIAGNOSIS — Z515 Encounter for palliative care: Secondary | ICD-10-CM | POA: Diagnosis not present

## 2021-08-09 DIAGNOSIS — G9341 Metabolic encephalopathy: Secondary | ICD-10-CM | POA: Diagnosis not present

## 2021-08-09 DIAGNOSIS — I871 Compression of vein: Secondary | ICD-10-CM | POA: Diagnosis not present

## 2021-08-09 DIAGNOSIS — Z20822 Contact with and (suspected) exposure to covid-19: Secondary | ICD-10-CM | POA: Diagnosis present

## 2021-08-09 DIAGNOSIS — I132 Hypertensive heart and chronic kidney disease with heart failure and with stage 5 chronic kidney disease, or end stage renal disease: Secondary | ICD-10-CM | POA: Diagnosis present

## 2021-08-09 DIAGNOSIS — N186 End stage renal disease: Secondary | ICD-10-CM | POA: Diagnosis present

## 2021-08-09 DIAGNOSIS — M4646 Discitis, unspecified, lumbar region: Secondary | ICD-10-CM | POA: Diagnosis not present

## 2021-08-09 DIAGNOSIS — Z992 Dependence on renal dialysis: Secondary | ICD-10-CM | POA: Diagnosis not present

## 2021-08-09 DIAGNOSIS — G9340 Encephalopathy, unspecified: Secondary | ICD-10-CM | POA: Diagnosis not present

## 2021-08-09 DIAGNOSIS — A4181 Sepsis due to Enterococcus: Secondary | ICD-10-CM | POA: Diagnosis not present

## 2021-08-09 DIAGNOSIS — M4636 Infection of intervertebral disc (pyogenic), lumbar region: Secondary | ICD-10-CM | POA: Diagnosis present

## 2021-08-09 DIAGNOSIS — M549 Dorsalgia, unspecified: Secondary | ICD-10-CM | POA: Diagnosis present

## 2021-08-09 DIAGNOSIS — I639 Cerebral infarction, unspecified: Secondary | ICD-10-CM | POA: Diagnosis not present

## 2021-08-09 DIAGNOSIS — Q631 Lobulated, fused and horseshoe kidney: Secondary | ICD-10-CM | POA: Diagnosis not present

## 2021-08-09 DIAGNOSIS — R569 Unspecified convulsions: Secondary | ICD-10-CM | POA: Diagnosis not present

## 2021-08-09 DIAGNOSIS — I82C11 Acute embolism and thrombosis of right internal jugular vein: Secondary | ICD-10-CM | POA: Diagnosis present

## 2021-08-09 DIAGNOSIS — I82210 Acute embolism and thrombosis of superior vena cava: Secondary | ICD-10-CM | POA: Diagnosis present

## 2021-08-09 DIAGNOSIS — M4622 Osteomyelitis of vertebra, cervical region: Secondary | ICD-10-CM | POA: Diagnosis present

## 2021-08-09 DIAGNOSIS — I5042 Chronic combined systolic (congestive) and diastolic (congestive) heart failure: Secondary | ICD-10-CM | POA: Diagnosis present

## 2021-08-09 DIAGNOSIS — R1312 Dysphagia, oropharyngeal phase: Secondary | ICD-10-CM | POA: Diagnosis not present

## 2021-08-09 LAB — BASIC METABOLIC PANEL
Anion gap: 10 (ref 5–15)
BUN: 67 mg/dL — ABNORMAL HIGH (ref 6–20)
CO2: 26 mmol/L (ref 22–32)
Calcium: 9.5 mg/dL (ref 8.9–10.3)
Chloride: 101 mmol/L (ref 98–111)
Creatinine, Ser: 10.22 mg/dL — ABNORMAL HIGH (ref 0.61–1.24)
GFR, Estimated: 5 mL/min — ABNORMAL LOW (ref >60)
Glucose, Bld: 107 mg/dL — ABNORMAL HIGH (ref 70–99)
Potassium: 5.1 mmol/L (ref 3.5–5.1)
Sodium: 137 mmol/L (ref 135–145)

## 2021-08-09 LAB — HIV ANTIBODY (ROUTINE TESTING W REFLEX): HIV Screen 4th Generation wRfx: NONREACTIVE

## 2021-08-09 MED ORDER — GABAPENTIN 300 MG PO CAPS
300.0000 mg | ORAL_CAPSULE | Freq: Three times a day (TID) | ORAL | Status: DC
  Administered 2021-08-09 – 2021-08-10 (×3): 300 mg via ORAL
  Filled 2021-08-09 (×3): qty 1

## 2021-08-09 NOTE — Progress Notes (Signed)
PROGRESS NOTE    [name] [name]  RDE:[id number] DOB: 06/30/1961 DOA: 08/08/2021 PCP: Pcp, No  142A/142A-AA   Assessment & Plan:   Principal Problem:   Lower back pain Active Problems:   End-stage renal disease on hemodialysis ([place])   Polysubstance abuse ([place])   Tobacco abuse   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 ([facility])   Anemia in ESRD (end-stage renal disease) ([place])   HTN (hypertension)   Back pain   [name] [name] is a 60 y.o. male with medical history significant of ESRD-HD (MWF), hypertension, tobacco abuse, polysubstance abuse, diverticulitis, anemia, jugular vein thrombosis, GI bleeding, right pneumothorax, who presents with lower back pain.   Patient states that he has been having lower back pain for more than 1 week, which has been progressively worsening.  The pain is constant, sharp, 10 out of 10 in severity today, nonradiating.  The pain is so severe that he cannot walk.  On arrival he had to be lifted out of his vehicle by security and nursing staff.     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 Pt does not have alarming symptoms.   Plan: --d/c IV morphine --cont Percocet 5 mg q4h PRN --start gabapentin 300 mg TID -As needed Flexeril --PT rec SNF  End-stage renal disease on hemodialysis (MWF) --iHD per nephro   Polysubstance abuse and tobacco abuse -Did counseling about importance of quitting substance use and tobacco use --cont nicotine patch -Check UDS   Anemia in ESRD (end-stage renal disease) ([place]):   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  --Eliquis changed from 5 mg daily to 2.5 mg bid per Dr. Keturah Barre recommendation. --cont eliquis   HTN: -IV hydralazine as needed --cont amlodipine   DVT prophylaxis: UD:JSHFWYO Code Status: Full code  Family Communication:  Level of care: Med-Surg Dispo:   The patient is from: home Anticipated d/c is to:  SNF Anticipated d/c date is: whenever bed available  Patient currently is ready to d/c.   Subjective and Interval History:  Pt continued to complain of back pain in mid lower back.  No dyspnea.  Due to severe pain limiting mobility, PT rec SNF.     Objective: Vitals:   08/09/21 1415 08/09/21 1619 08/09/21 1646 08/09/21 2006  BP: 118/76 (!) 115/56 (!) 108/48 116/65  Pulse: 97 90 94 97  Resp: (!) 22 (!) 33 (!) 25 17  Temp:  98.1 F (36.7 C) 98.2 F (36.8 C) 98.7 F (37.1 C)  TempSrc:      SpO2: (!) 88% 98% 94% 100%  Weight:      Height:        Intake/Output Summary (Last 24 hours) at 08/09/2021 2134 Last data filed at 08/09/2021 1400 Gross per 24 hour  Intake --  Output 2000 ml  Net -2000 ml   Filed Weights   08/08/21 0625  Weight: 91 kg    Examination:   Constitutional: NAD, appeared sleepy but answering questions appropriately.  Oriented.   HEENT: conjunctivae and lids normal, EOMI CV: No cyanosis.   RESP: normal respiratory effort, on RA Extremities: No effusions, edema in BLE SKIN: warm, dry Neuro: II - XII grossly intact.     Data Reviewed: I have personally reviewed following labs and imaging studies  CBC: Recent Labs  Lab 08/08/21 0816  WBC 9.3  NEUTROABS 7.6  HGB 9.6*  HCT 30.3*  MCV 95.3  PLT 136*  Basic Metabolic Panel: Recent Labs  Lab 08/08/21 0816 08/09/21 0451  NA 139 137  K 4.6 5.1  CL 101 101  CO2 28 26  GLUCOSE 93 107*  BUN 49* 67*  CREATININE 8.43* 10.22*  CALCIUM 9.7 9.5   GFR: Estimated Creatinine Clearance: 9 mL/min (A) (by C-G formula based on SCr of 10.22 mg/dL (H)). Liver Function Tests: Recent Labs  Lab 08/08/21 0816  AST 16  ALT 13  ALKPHOS 79  BILITOT 1.1  PROT 6.9  ALBUMIN 2.6*   No results for input(s): LIPASE, AMYLASE in the last 168 hours. No results for input(s): AMMONIA in the last 168 hours. Coagulation Profile: No results for input(s): INR, PROTIME in the last 168 hours. Cardiac  Enzymes: No results for input(s): CKTOTAL, CKMB, CKMBINDEX, TROPONINI in the last 168 hours. BNP (last 3 results) No results for input(s): PROBNP in the last 8760 hours. HbA1C: No results for input(s): HGBA1C in the last 72 hours. CBG: No results for input(s): GLUCAP in the last 168 hours. Lipid Profile: No results for input(s): CHOL, HDL, LDLCALC, TRIG, CHOLHDL, LDLDIRECT in the last 72 hours. Thyroid Function Tests: No results for input(s): TSH, T4TOTAL, FREET4, T3FREE, THYROIDAB in the last 72 hours. Anemia Panel: No results for input(s): VITAMINB12, FOLATE, FERRITIN, TIBC, IRON, RETICCTPCT in the last 72 hours. Sepsis Labs: No results for input(s): PROCALCITON, LATICACIDVEN in the last 168 hours.  Recent Results (from the past 240 hour(s))  Resp Panel by RT-PCR (Flu A&B, Covid) Nasopharyngeal Swab     Status: None   Collection Time: 08/08/21  1:22 PM   Specimen: Nasopharyngeal Swab; Nasopharyngeal(NP) swabs in vial transport medium  Result Value Ref Range Status   SARS Coronavirus 2 by RT PCR NEGATIVE NEGATIVE Final    Comment: (NOTE) SARS-CoV-2 target nucleic acids are NOT DETECTED.  The SARS-CoV-2 RNA is generally detectable in upper respiratory specimens during the acute phase of infection. The lowest concentration of SARS-CoV-2 viral copies this assay can detect is 138 copies/mL. A negative result does not preclude SARS-Cov-2 infection and should not be used as the sole basis for treatment or other patient management decisions. A negative result may occur with  improper specimen collection/handling, submission of specimen other than nasopharyngeal swab, presence of viral mutation(s) within the areas targeted by this assay, and inadequate number of viral copies(<138 copies/mL). A negative result must be combined with clinical observations, patient history, and epidemiological information. The expected result is Negative.  Fact Sheet for Patients:   EntrepreneurPulse.com.au  Fact Sheet for Healthcare Providers:  IncredibleEmployment.be  This test is no t yet approved or cleared by the [place] FDA and  has been authorized for detection and/or diagnosis of SARS-CoV-2 by FDA under an Emergency Use Authorization (EUA). This EUA will remain  in effect (meaning this test can be used) for the duration of the COVID-19 declaration under Section 564(b)(1) of the Act, 21 U.S.C.section 360bbb-3(b)(1), unless the authorization is terminated  or revoked sooner.       Influenza A by PCR NEGATIVE NEGATIVE Final   Influenza B by PCR NEGATIVE NEGATIVE Final    Comment: (NOTE) The Xpert Xpress SARS-CoV-2/FLU/RSV plus assay is intended as an aid in the diagnosis of influenza from Nasopharyngeal swab specimens and should not be used as a sole basis for treatment. Nasal washings and aspirates are unacceptable for Xpert Xpress SARS-CoV-2/FLU/RSV testing.  Fact Sheet for Patients: EntrepreneurPulse.com.au  Fact Sheet for Healthcare Providers: IncredibleEmployment.be  This test is not yet approved or  cleared by the [place] and has been authorized for detection and/or diagnosis of SARS-CoV-2 by FDA under an Emergency Use Authorization (EUA). This EUA will remain in effect (meaning this test can be used) for the duration of the COVID-19 declaration under Section 564(b)(1) of the Act, 21 U.S.C. section 360bbb-3(b)(1), unless the authorization is terminated or revoked.  Performed at [facility], [address] [place] [address]       Radiology Studies: CT Lumbar Spine Wo Contrast  Result Date: 08/08/2021 CLINICAL DATA:  Low back pain, increased fracture risk EXAM: CT LUMBAR SPINE WITHOUT CONTRAST TECHNIQUE: Multidetector CT imaging of the lumbar spine was performed without intravenous contrast administration. Multiplanar CT image  reconstructions were also generated. COMPARISON:  None. FINDINGS: Segmentation: 5 lumbar type vertebrae. Alignment: Grade 1 anterolisthesis at L4-L5. 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 Degenerative plate irregularity and Schmorl's nodes. Vertebral body heights are unchanged. Paraspinal and other soft tissues: Partially imaged small volume free fluid in the dependent pelvis. Colonic diverticulosis. Atrophic horseshoe kidney with multiple cysts. Aortic atherosclerosis. Multiple collateral vessels in the posterior abdominal wall. Disc levels: L1-L2: Facet hypertrophy. No significant canal or foraminal stenosis. L2-L3: Disc bulge. Facet hypertrophy. No significant canal or foraminal stenosis. L3-L4: Disc bulge. Facet hypertrophy. No significant canal or foraminal stenosis. L4-L5: Anterolisthesis with uncovering of disc bulge. Facet hypertrophy. Ligamentum flavum thickening. Probable moderate canal stenosis. Bilateral foraminal stenosis, left greater than right. L5-S1: Disc bulge with endplate osteophytic ridging. Facet hypertrophy. Ligamentum flavum thickening. No significant canal stenosis. Bilateral foraminal stenosis. IMPRESSION: No acute osseous abnormality. Multilevel degenerative changes as detailed above. Canal and foraminal stenosis are greatest at L4-L5. Partially imaged nonspecific small volume free fluid in the dependent pelvis. Electronically Signed   By: Macy Mis M.D.   On: 08/08/2021 09:43     Scheduled Meds:  amLODipine  5 mg Oral QPM   apixaban  2.5 mg Oral BID   calcium acetate  2,668 mg Oral TID WC   Chlorhexidine Gluconate Cloth  6 each Topical Q0600   gabapentin  300 mg Oral TID   lidocaine  1 patch Transdermal Q24H   nicotine  21 mg Transdermal Daily   Continuous Infusions:   LOS: 0 days     Enzo Bi, MD [facility] If 7PM-7AM, please contact night-coverage 08/09/2021, 9:34 PM

## 2021-08-09 NOTE — Progress Notes (Signed)
Tolerated HD tx for 3 hrs without incident.  UF goal of 2L met.  Report given to primary nuirse

## 2021-08-09 NOTE — Evaluation (Signed)
Physical Therapy Evaluation Patient Details Name: [name] [name] MRN: [id number] DOB: November 12, 1961 Today's Date: 08/09/2021   History of Present Illness  Pt is a 60 y.o. male with medical history significant of ESRD-HD (MWF), hypertension, tobacco abuse, polysubstance abuse, diverticulitis, anemia, jugular vein thrombosis, GI bleeding, right pneumothorax, who presents with lower back pain. 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   Clinical Impression  Pt found in sidelying moaning in pain, pre-medicated prior to session.  Pt motivated to attempt to find a position of comfort/attempt to get to the recliner to reposition.  Pt required physical assistance with all functional tasks and was limited to a max of several steps from bed to chair during ambulation attempt.  Pt would not be safe to return to his prior living situation at this time and is at high risk of functional decline and falls.  Pt will benefit from PT services in a SNF setting upon discharge to safely address deficits listed in patient problem list for decreased caregiver assistance and eventual return to PLOF.     Follow Up Recommendations SNF    Equipment Recommendations  Rolling walker with 5" wheels;3in1 (PT)    Recommendations for Other Services       Precautions / Restrictions Precautions Precautions: Fall Restrictions Weight Bearing Restrictions: No      Mobility  Bed Mobility Overal bed mobility: Needs Assistance Bed Mobility: Rolling;Sidelying to Sit Rolling: Min assist Sidelying to sit: Mod assist       General bed mobility comments: Log roll training provided    Transfers Overall transfer level: Needs assistance Equipment used: Rolling walker (2 wheeled) Transfers: Sit to/from Stand Sit to Stand: Mod assist;+2 safety/equipment          General transfer comment: Mod verbal cues for sequencing  Ambulation/Gait Ambulation/Gait assistance: Min assist Gait Distance (Feet): 3 Feet Assistive device: Rolling walker (2 wheeled) Gait Pattern/deviations: Step-through pattern;Decreased step length - right;Decreased step length - left Gait velocity: decreased   General Gait Details: Pt able to take several steps at the EOB and from bed to chair with great effort secondary to pain and with min A for stability and to guide the RW  Stairs            Wheelchair Mobility    Modified Rankin (Stroke Patients Only)       Balance Overall balance assessment: Needs assistance   Sitting balance-Leahy Scale: Fair     Standing balance support: Bilateral upper extremity supported;During functional activity Standing balance-Leahy Scale: Poor Standing balance comment: Min A for stability                             Pertinent Vitals/Pain Pain Assessment: 0-10 Pain Score: 9  Pain Location: Low back pain Pain Descriptors / Indicators: Aching;Sore;Moaning;Grimacing Pain Intervention(s): Premedicated before session;Repositioned;Monitored during session    Home Living Family/patient expects to be discharged to:: Private residence Living Arrangements: Alone Available Help at Discharge: Family;Available PRN/intermittently Type of Home: House Home Access: Stairs to enter Entrance Stairs-Rails: None Entrance Stairs-Number of Steps: 6 Home Layout: One level Home Equipment: None      Prior Function Level of Independence: Independent         Comments: Ind amb community distances, no fall history, Ind with ADLs     Hand Dominance  Extremity/Trunk Assessment   Upper Extremity Assessment Upper Extremity Assessment: Generalized weakness    Lower Extremity Assessment Lower Extremity Assessment: Generalized weakness;RLE deficits/detail;LLE deficits/detail RLE: Unable to fully assess due to pain LLE:  Unable to fully assess due to pain       Communication   Communication: No difficulties  Cognition Arousal/Alertness: Awake/alert Behavior During Therapy: WFL for tasks assessed/performed Overall Cognitive Status: Within Functional Limits for tasks assessed                                        General Comments      Exercises Other Exercises Other Exercises: Log roll training Other Exercises: Static unsupported sitting at EOB for increased activity tolerance and back pain control   Assessment/Plan    PT Assessment Patient needs continued PT services  PT Problem List Decreased strength;Decreased activity tolerance;Decreased balance;Decreased mobility;Decreased knowledge of use of DME;Pain       PT Treatment Interventions DME instruction;Gait training;Stair training;Functional mobility training;Therapeutic activities;Therapeutic exercise;Balance training;Patient/family education    PT Goals (Current goals can be found in the Care Plan section)  Acute Rehab PT Goals Patient Stated Goal: Decreased pain with mobility PT Goal Formulation: With patient Time For Goal Achievement: 08/22/21 Potential to Achieve Goals: Good    Frequency Min 2X/week   Barriers to discharge Inaccessible home environment;Decreased caregiver support      Co-evaluation               AM-PAC PT "6 Clicks" Mobility  Outcome Measure Help needed turning from your back to your side while in a flat bed without using bedrails?: A Little Help needed moving from lying on your back to sitting on the side of a flat bed without using bedrails?: A Lot Help needed moving to and from a bed to a chair (including a wheelchair)?: A Lot Help needed standing up from a chair using your arms (e.g., wheelchair or bedside chair)?: A Lot Help needed to walk in hospital room?: Total Help needed climbing 3-5 steps with a railing? : Total 6 Click Score: 11    End of Session Equipment Utilized During  Treatment: Gait belt Activity Tolerance: Patient limited by pain Patient left: in chair;with call bell/phone within reach;with chair alarm set;with nursing/sitter in room Nurse Communication: Mobility status PT Visit Diagnosis: Unsteadiness on feet (R26.81);Difficulty in walking, not elsewhere classified (R26.2);Muscle weakness (generalized) (M62.81);Pain Pain - part of body:  (low back)    Time: 9449-6759 PT Time Calculation (min) (ACUTE ONLY): 31 min   Charges:   PT Evaluation $PT Eval Moderate Complexity: 1 Mod PT Treatments $Therapeutic Activity: 8-22 mins        D. Royetta Asal PT, DPT 08/09/21, 1:39 PM

## 2021-08-09 NOTE — Progress Notes (Signed)
[facility]  ROUNDING NOTE   Subjective:   [name] [name] is a 60 y.o. male with medical conditions including anemia, CHF, hypertension, and ESRD on dialysis. Patient has presented to ED with progressive back pain.   Patient states he has always had back pain but it has increased in the past few days. Denies known precipitating factors. Chest X-ray of lumbar spine shows multilevel degenerative changes and stenosis, greatest at L4-L5.   Patient is known to our practice and receives dialysis at [facility] supervised by Dr Juleen China. We have been consulted to manage dialysis treatments.   Patient seen during dialysis   Continues to complain of back pain, moaning and restless during treatment. Floor nurse contacted and brought pain medication.   Objective:  Vital signs in last 24 hours:  Temp:  [97.6 F (36.4 C)-98.6 F (37 C)] 98.6 F (37 C) (08/31 1051) Pulse Rate:  [78-96] 96 (08/31 1130) Resp:  [16-35] 26 (08/31 1130) BP: (102-175)/(54-76) 110/68 (08/31 1130) SpO2:  [84 %-100 %] 98 % (08/31 1130)  Weight change:  Filed Weights   08/08/21 0625  Weight: 91 kg    Intake/Output: No intake/output data recorded.   Intake/Output this shift:  No intake/output data recorded.  Physical Exam: General: NAD, resting in bed  Head: Normocephalic, atraumatic. Moist oral mucosal membranes  Eyes: Anicteric  Lungs:  Clear to auscultation, normal effort, O2 2L  Heart: Regular rate and rhythm  Abdomen:  Soft, nontender,   Extremities:  1+ peripheral edema.  Neurologic: Alert, moving all four extremities  Skin: No lesions  Access: Rt AVG    Basic Metabolic Panel: Recent Labs  Lab 08/08/21 0816 08/09/21 0451  NA 139 137  K 4.6 5.1  CL 101 101  CO2 28 26  GLUCOSE 93 107*  BUN 49* 67*  CREATININE 8.43* 10.22*  CALCIUM 9.7 9.5     Liver Function Tests: Recent Labs  Lab 08/08/21 0816  AST 16  ALT 13  ALKPHOS 79  BILITOT 1.1  PROT 6.9  ALBUMIN 2.6*     No results for input(s): LIPASE, AMYLASE in the last 168 hours. No results for input(s): AMMONIA in the last 168 hours.  CBC: Recent Labs  Lab 08/08/21 0816  WBC 9.3  NEUTROABS 7.6  HGB 9.6*  HCT 30.3*  MCV 95.3  PLT 136*     Cardiac Enzymes: No results for input(s): CKTOTAL, CKMB, CKMBINDEX, TROPONINI in the last 168 hours.  BNP: Invalid input(s): POCBNP  CBG: No results for input(s): GLUCAP in the last 168 hours.  Microbiology: Results for orders placed or performed during the hospital encounter of 08/08/21  Resp Panel by RT-PCR (Flu A&B, Covid) Nasopharyngeal Swab     Status: None   Collection Time: 08/08/21  1:22 PM   Specimen: Nasopharyngeal Swab; Nasopharyngeal(NP) swabs in vial transport medium  Result Value Ref Range Status   SARS Coronavirus 2 by RT PCR NEGATIVE NEGATIVE Final    Comment: (NOTE) SARS-CoV-2 target nucleic acids are NOT DETECTED.  The SARS-CoV-2 RNA is generally detectable in upper respiratory specimens during the acute phase of infection. The lowest concentration of SARS-CoV-2 viral copies this assay can detect is 138 copies/mL. A negative result does not preclude SARS-Cov-2 infection and should not be used as the sole basis for treatment or other patient management decisions. A negative result may occur with  improper specimen collection/handling, submission of specimen other than nasopharyngeal swab, presence of viral mutation(s) within the areas targeted by this  assay, and inadequate number of viral copies(<138 copies/mL). A negative result must be combined with clinical observations, patient history, and epidemiological information. The expected result is Negative.  Fact Sheet for Patients:  EntrepreneurPulse.com.au  Fact Sheet for Healthcare Providers:  IncredibleEmployment.be  This test is no t yet approved or cleared by the [place] FDA and  has been authorized for detection and/or  diagnosis of SARS-CoV-2 by FDA under an Emergency Use Authorization (EUA). This EUA will remain  in effect (meaning this test can be used) for the duration of the COVID-19 declaration under Section 564(b)(1) of the Act, 21 U.S.C.section 360bbb-3(b)(1), unless the authorization is terminated  or revoked sooner.       Influenza A by PCR NEGATIVE NEGATIVE Final   Influenza B by PCR NEGATIVE NEGATIVE Final    Comment: (NOTE) The Xpert Xpress SARS-CoV-2/FLU/RSV plus assay is intended as an aid in the diagnosis of influenza from Nasopharyngeal swab specimens and should not be used as a sole basis for treatment. Nasal washings and aspirates are unacceptable for Xpert Xpress SARS-CoV-2/FLU/RSV testing.  Fact Sheet for Patients: EntrepreneurPulse.com.au  Fact Sheet for Healthcare Providers: IncredibleEmployment.be  This test is not yet approved or cleared by the [place] FDA and has been authorized for detection and/or diagnosis of SARS-CoV-2 by FDA under an Emergency Use Authorization (EUA). This EUA will remain in effect (meaning this test can be used) for the duration of the COVID-19 declaration under Section 564(b)(1) of the Act, 21 U.S.C. section 360bbb-3(b)(1), unless the authorization is terminated or revoked.  Performed at [facility], [address] [place], [place] [address]     Coagulation Studies: No results for input(s): LABPROT, INR in the last 72 hours.  Urinalysis: No results for input(s): COLORURINE, LABSPEC, PHURINE, GLUCOSEU, HGBUR, BILIRUBINUR, KETONESUR, PROTEINUR, UROBILINOGEN, NITRITE, LEUKOCYTESUR in the last 72 hours.  Invalid input(s): APPERANCEUR    Imaging: CT Lumbar Spine Wo Contrast  Result Date: 08/08/2021 CLINICAL DATA:  Low back pain, increased fracture risk EXAM: CT LUMBAR SPINE WITHOUT CONTRAST TECHNIQUE: Multidetector CT imaging of the lumbar spine was performed without intravenous contrast  administration. Multiplanar CT image reconstructions were also generated. COMPARISON:  None. FINDINGS: Segmentation: 5 lumbar type vertebrae. Alignment: Grade 1 anterolisthesis at L4-L5. 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 Degenerative plate irregularity and Schmorl's nodes. Vertebral body heights are unchanged. Paraspinal and other soft tissues: Partially imaged small volume free fluid in the dependent pelvis. Colonic diverticulosis. Atrophic horseshoe kidney with multiple cysts. Aortic atherosclerosis. Multiple collateral vessels in the posterior abdominal wall. Disc levels: L1-L2: Facet hypertrophy. No significant canal or foraminal stenosis. L2-L3: Disc bulge. Facet hypertrophy. No significant canal or foraminal stenosis. L3-L4: Disc bulge. Facet hypertrophy. No significant canal or foraminal stenosis. L4-L5: Anterolisthesis with uncovering of disc bulge. Facet hypertrophy. Ligamentum flavum thickening. Probable moderate canal stenosis. Bilateral foraminal stenosis, left greater than right. L5-S1: Disc bulge with endplate osteophytic ridging. Facet hypertrophy. Ligamentum flavum thickening. No significant canal stenosis. Bilateral foraminal stenosis. IMPRESSION: No acute osseous abnormality. Multilevel degenerative changes as detailed above. Canal and foraminal stenosis are greatest at L4-L5. Partially imaged nonspecific small volume free fluid in the dependent pelvis. Electronically Signed   By: Macy Mis M.D.   On: 08/08/2021 09:43     Medications:    sodium chloride     sodium chloride      amLODipine  5 mg Oral QPM   apixaban  2.5 mg Oral BID   calcium acetate  2,668 mg Oral  TID WC   Chlorhexidine Gluconate Cloth  6 each Topical Q0600   lidocaine  1 patch Transdermal Q24H   nicotine  21 mg Transdermal Daily   sodium chloride, sodium chloride, acetaminophen, alteplase, cyclobenzaprine, heparin, hydrALAZINE, lidocaine (PF), lidocaine-prilocaine,  ondansetron (ZOFRAN) IV, oxyCODONE-acetaminophen, pentafluoroprop-tetrafluoroeth  Assessment/ Plan:  [name] [name] is a 60 y.o.  male with medical conditions including anemia, CHF, hypertension, and ESRD on dialysis. Patient has presented to ED with progressive back pain.  [place]/MWF/Rt AVG  1. End stage renal disease on dialysis Will maintain outpatient schedule if possible Receiving dialysis today, UF goal 2L as tolerated Next treatment scheduled for Friday  2. Anemia of chronic kidney disease Lab Results  Component Value Date   HGB 9.6 (L) 08/08/2021  Hgb at goal  3. Secondary Hyperparathyroidism:  Lab Results  Component Value Date   PTH 583.2 (H) 12/11/2012   CALCIUM 9.5 08/09/2021   CAION 1.27 (H) 12/26/2012   PHOS 7.0 (H) 04/16/2019  Calcium at goal, phosphorus elevated Calcium acetate with meals outpatient   4. 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 LE edema Will manage volume with dialysis treatments   LOS: 0   8/31/202211:46 AM

## 2021-08-09 NOTE — Plan of Care (Signed)

## 2021-08-09 NOTE — Progress Notes (Signed)
OT Cancellation Note  Patient Details Name: KALDEN WANKE MRN: [id number] DOB: [birth year]   Cancelled Treatment:    Reason Eval/Treat Not Completed: Patient at procedure or test/ unavailable. Orders received and chart reviewed. Pt currently off the floor for HD. OT to re-attempt at later time/date and evaluate when able.  Fredirick Maudlin, OTR/L [place]

## 2021-08-09 NOTE — Evaluation (Signed)
Occupational Therapy Evaluation Patient Details Name: [name] [name] MRN: [id number] DOB: [birth year] Today's Date: 08/09/2021    History of Present Illness Pt is a 60 y.o. male with medical history significant of ESRD-HD (MWF), hypertension, tobacco abuse, polysubstance abuse, diverticulitis, anemia, jugular vein thrombosis, GI bleeding, right pneumothorax, who presents with lower back pain. 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   Clinical Impression   Pt seen for OT evaluation this date. Upon arrival to room, pt laying on R side in bed, reporting 10/10 pain, and RN requesting assistance from this author to roll pt to achieve midline position in bed. Pt educated on the importance of weight shifting while in bed to maintain skin integrity, with pt verbalizing understanding however requiring MAX A to attain midline position from side-lying position. Pt agreeable to OT eval/tx, and specially requested assistance with bathing.   Prior to admission, pt was was independent in all ADLs and functional mobility, living in a 1-story home alone. Pt's nephew assists with IADLs. Pt tearful while answering PLOF questions and reporting that his wife and another family member recently passed away and that he does not feel that he has anyone close to him; OT provided comfort.  Pt currently requires MIN A for bed-level grooming, MOD A for bed-level UB bathing/dressing, and MAX A for bed-level LB ADLs due to current functional impairments (See OT Problem List below). Pt declined OOB mobility this date d/t 10/10 back pain. Pt would benefit from additional skilled OT services to maximize return to PLOF and minimize risk of future falls, injury, caregiver burden, and readmission. Upon discharge, recommend SNF.     Follow Up Recommendations  SNF    Equipment  Recommendations  Other (comment) (defer to next venue of care)       Precautions / Restrictions Precautions Precautions: Fall Restrictions Weight Bearing Restrictions: No      Mobility Bed Mobility Overal bed mobility: Needs Assistance Bed Mobility: Rolling Rolling: Max assist Sidelying to sit: Mod assist       General bed mobility comments: MAX A to roll to midline position from R side    Transfers Overall transfer level: Needs assistance Equipment used: Rolling walker (2 wheeled) Transfers: Sit to/from Stand Sit to Stand: Mod assist;+2 safety/equipment         General transfer comment: pt declined OOB mobility d/t pain    Balance Overall balance assessment: Needs assistance   Sitting balance-Leahy Scale: Fair     Standing balance support: Bilateral upper extremity supported;During functional activity Standing balance-Leahy Scale: Poor Standing balance comment: Min A for stability                           ADL either performed or assessed with clinical judgement   ADL Overall ADL's : Needs assistance/impaired     Grooming: Wash/dry hands;Wash/dry face;Minimal assistance;Bed level Grooming Details (indicate cue type and reason): MIN A to initiate task, however pt able to carry out and complete independently Upper Body Bathing: Moderate assistance;Bed level Upper Body Bathing Details (indicate cue type and reason): Pt able to physically reach all areas of b/l UE with washcloth, however requiring MOD A in setting of fatiguing quickly and putting in minimal effort d/t significant back pain Lower Body Bathing: Maximal assistance;Bed level   Upper Body Dressing : Modified independent;Bed level Upper Body  Dressing Details (indicate cue type and reason): to don/doff hospital gown. requires cues to initiate task                         Pertinent Vitals/Pain Pain Assessment: 0-10 Pain Score: 10-Worst pain ever Pain Location: Low back  pain Pain Descriptors / Indicators: Aching;Sore;Moaning;Grimacing Pain Intervention(s): Monitored during session;Limited activity within patient's tolerance;Repositioned;RN gave pain meds during session;Utilized relaxation techniques        Extremity/Trunk Assessment Upper Extremity Assessment Upper Extremity Assessment: Generalized weakness (Difficult to formally assess d/t pain however grossly 3/5 in all movements)   Lower Extremity Assessment Lower Extremity Assessment: Generalized weakness RLE: Unable to fully assess due to pain LLE: Unable to fully assess due to pain       Communication Communication Communication: No difficulties   Cognition Arousal/Alertness: Awake/alert Behavior During Therapy: WFL for tasks assessed/performed Overall Cognitive Status: No family/caregiver present to determine baseline cognitive functioning                                 General Comments: Difficult to assess cognition in setting of pt minimally speaking d/t pain, however pt able to follow 1-step commands consistently. Pt tearful at beginning of session, reporting that his wife and another family member recently passed away and that he does not feel that he has anyone close to him; OT provided comfort.      Exercises Other Exercises Other Exercises: Educated pt on role of OT, POC, and d/c recommendations Other Exercises: Educated pt on importance of weight shifting while in bed to maintain skin integrity        Home Living Family/patient expects to be discharged to:: Private residence Living Arrangements: Alone Available Help at Discharge: Family;Available PRN/intermittently Type of Home: House Home Access: Stairs to enter [employer] of Steps: 6 Entrance Stairs-Rails: None Home Layout: One level     Bathroom Shower/Tub: Teacher, early years/pre: Standard     Home Equipment: None;Grab bars - tub/shower          Prior  Functioning/Environment Level of Independence: Independent        Comments: Ind amb community distances, no fall history. Ind with ADLs. Nephew assists with IADLs        OT Problem List: Decreased strength;Decreased activity tolerance;Pain      OT Treatment/Interventions: Self-care/ADL training;Therapeutic exercise;DME and/or AE instruction;Energy conservation;Cognitive remediation/compensation;Therapeutic activities;Patient/family education;Balance training    OT Goals(Current goals can be found in the care plan section) Acute Rehab OT Goals Patient Stated Goal: to have less pain OT Goal Formulation: With patient Time For Goal Achievement: 08/23/21 Potential to Achieve Goals: Fair ADL Goals Pt Will Perform Grooming: with supervision;sitting Pt Will Perform Upper Body Bathing: with set-up;with supervision;sitting Pt Will Perform Upper Body Dressing: with supervision;sitting  OT Frequency: Min 1X/week    AM-PAC OT "6 Clicks" Daily Activity     Outcome Measure Help from another person eating meals?: A Little Help from another person taking care of personal grooming?: A Little Help from another person toileting, which includes using toliet, bedpan, or urinal?: A Lot Help from another person bathing (including washing, rinsing, drying)?: A Lot Help from another person to put on and taking off regular upper body clothing?: A Lot Help from another person to put on and taking off regular lower body clothing?: Total 6 Click Score: 13   End of Session Equipment Utilized  During Treatment: Oxygen Nurse Communication: Mobility status;Patient requests pain meds  Activity Tolerance: Patient limited by pain Patient left: in bed;with call bell/phone within reach;with bed alarm set  OT Visit Diagnosis: Muscle weakness (generalized) (M62.81);Pain Pain - part of body:  (back)                Time: 3833-3832 OT Time Calculation (min): 27 min Charges:  OT General Charges $OT Visit: 1  Visit OT Evaluation $OT Eval Moderate Complexity: 1 Mod OT Treatments $Self Care/Home Management : 8-22 mins  Fredirick Maudlin, OTR/L [place]

## 2021-08-09 NOTE — NC FL2 (Signed)
[place] LEVEL OF CARE SCREENING TOOL     IDENTIFICATION  Patient Name: [name] [name] Birthdate: 1961-05-28 Sex: male Admission Date (Current Location): 08/08/2021  [facility] and [place] Number:  Engineering geologist and Address:  [facility], [address], [place], [place] [address]      Provider Number: [id number]  Attending Physician Name and Address:  Enzo Bi, MD  Relative Name and Phone Number:  Hortense Ramal Mother [phone]    Current Level of Care: Hospital Recommended Level of Care:   Prior Approval Number:    Date Approved/Denied:   PASRR Number: [id number]  Discharge Plan: SNF    Current Diagnoses: Patient Active Problem List   Diagnosis Date Noted   Back pain 08/09/2021   Anemia in ESRD (end-stage renal disease) ([place]) 08/08/2021   Lower back pain 08/08/2021   HTN (hypertension) 08/08/2021   Rectal bleeding 04/16/2019   Diverticulitis large intestine 12/05/2017   Diverticulitis of colon 04/24/2015   Community acquired pneumonia 04/18/2015   Sepsis ([place]) 04/16/2015   Shortness of breath 07/07/2013   Acidosis, metabolic, with respiratory acidosis 04/23/2013   Acute post-thoracotomy pain 04/23/2013   Hemorrhagic shock ([place]) 04/23/2013   S/P sympathectomy 04/23/2013   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   Persistent air leak 03/18/2013   Pneumothorax, right 03/18/2013   S/P thoracotomy 03/04/2013   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 ([place]) 01/17/2013   Bacteremia 01/06/2013   Benign neoplasm of colon 12/16/2012   Diverticulosis of colon (without mention of hemorrhage) 12/16/2012   ESCRF (end stage chronic renal failure) ([place]) 12/12/2012   H/O: hypertension 12/12/2012   Pleural effusion, right 12/12/2012   History of prolonged Q-T interval on ECG 12/12/2012   Hyperkalemia 12/11/2012   Acute blood loss anemia 12/11/2012   Sinus tachycardia, resolved with PRBC resuscitation  12/11/2012   Lower GI bleed 12/11/2012   End-stage renal disease on hemodialysis ([place])    Secondary hyperparathyroidism, renal (HCC)    Hypotension    Polysubstance abuse (HCC)    Anemia of chronic disease    Tobacco abuse     Orientation RESPIRATION BLADDER Height & Weight     Self, Time, Situation, Place  Normal Continent Weight: 91 kg Height:  6\' 1"  (185.4 cm)  BEHAVIORAL SYMPTOMS/MOOD NEUROLOGICAL BOWEL NUTRITION STATUS      Continent Diet (renal diet, 1200 fluid restriction)  AMBULATORY STATUS COMMUNICATION OF NEEDS Skin   Extensive Assist Verbally Normal                       Personal Care Assistance Level of Assistance  Bathing, Feeding, Dressing Bathing Assistance: Limited assistance Feeding assistance: Independent Dressing Assistance: Limited assistance     Functional Limitations Info             SPECIAL CARE FACTORS FREQUENCY  PT (By licensed PT), OT (By licensed OT)     PT Frequency: 5 times per week OT Frequency: 5 times per week            Contractures Contractures Info: Not present    Additional Factors Info  Code Status, Allergies Code Status Info: full Allergies Info: tape, adhesive glue           Current Medications (08/09/2021):  This is the current hospital active medication list Current Facility-Administered Medications  Medication Dose Route Frequency Provider Last Rate Last Admin   acetaminophen (TYLENOL) tablet 650 mg  650 mg Oral Q6H PRN Blaine Hamper,  Soledad Gerlach, MD   650 mg at 08/09/21 0336   amLODipine (NORVASC) tablet 5 mg  5 mg Oral QPM Murlean Iba, MD   5 mg at 08/08/21 1821   apixaban (ELIQUIS) tablet 2.5 mg  2.5 mg Oral BID Murlean Iba, MD   2.5 mg at 08/09/21 0933   calcium acetate (PHOSLO) capsule 2,668 mg  2,668 mg Oral TID WC Ivor Costa, MD   2,668 mg at 08/09/21 3568   Chlorhexidine Gluconate Cloth 2 % PADS 6 each  6 each Topical Q0600 Colon Flattery, NP   6 each at 08/09/21 6168   cyclobenzaprine (FLEXERIL) tablet 5  mg  5 mg Oral TID PRN Ivor Costa, MD   5 mg at 08/09/21 3729   hydrALAZINE (APRESOLINE) injection 5 mg  5 mg Intravenous Q2H PRN Ivor Costa, MD       lidocaine (LIDODERM) 5 % 1 patch  1 patch Transdermal Q24H Ivor Costa, MD   1 patch at 08/09/21 1533   nicotine (NICODERM CQ - dosed in mg/24 hours) patch 21 mg  21 mg Transdermal Daily Ivor Costa, MD   21 mg at 08/09/21 0937   ondansetron (ZOFRAN) injection 4 mg  4 mg Intravenous Q8H PRN Ivor Costa, MD       oxyCODONE-acetaminophen (PERCOCET/ROXICET) 5-325 MG per tablet 1 tablet  1 tablet Oral Q4H PRN Ivor Costa, MD   1 tablet at 08/09/21 1146     Discharge Medications: Please see discharge summary for a list of discharge medications.  Relevant Imaging Results:  Relevant Lab Results:   Additional Information Wilson Karol Liendo, RN

## 2021-08-09 NOTE — TOC Progression Note (Signed)
Transition of Care [facility]) - Progression Note    Patient Details  Name: [name] [name] MRN: [id number] Date of Birth: Apr 12, 1961  Transition [facility]) CM/SW [place], RN Phone Number: 08/09/2021, 3:54 PM  Clinical Narrative:     Met with the patient in the room and he is agreeable to a bedsearch to go to [facility] SNF, [place] sent, PASSR obtained, FL2 completed      Expected Discharge Plan and Services                                                 Social Determinants of Health (SDOH) Interventions    Readmission Risk Interventions No flowsheet data found.

## 2021-08-10 DIAGNOSIS — M5440 Lumbago with sciatica, unspecified side: Secondary | ICD-10-CM | POA: Diagnosis not present

## 2021-08-10 LAB — BASIC METABOLIC PANEL
Anion gap: 9 (ref 5–15)
BUN: 47 mg/dL — ABNORMAL HIGH (ref 6–20)
CO2: 29 mmol/L (ref 22–32)
Calcium: 9.3 mg/dL (ref 8.9–10.3)
Chloride: 99 mmol/L (ref 98–111)
Creatinine, Ser: 7.87 mg/dL — ABNORMAL HIGH (ref 0.61–1.24)
GFR, Estimated: 7 mL/min — ABNORMAL LOW (ref >60)
Glucose, Bld: 83 mg/dL (ref 70–99)
Potassium: 4.4 mmol/L (ref 3.5–5.1)
Sodium: 137 mmol/L (ref 135–145)

## 2021-08-10 LAB — CBC
HCT: 31.9 % — ABNORMAL LOW (ref 39.0–52.0)
Hemoglobin: 10 g/dL — ABNORMAL LOW (ref 13.0–17.0)
MCH: 29.2 pg (ref 26.0–34.0)
MCHC: 31.3 g/dL (ref 30.0–36.0)
MCV: 93.3 fL (ref 80.0–100.0)
Platelets: 150 10*3/uL (ref 150–400)
RBC: 3.42 MIL/uL — ABNORMAL LOW (ref 4.22–5.81)
RDW: 16.7 % — ABNORMAL HIGH (ref 11.5–15.5)
WBC: 8.8 10*3/uL (ref 4.0–10.5)
nRBC: 0 % (ref 0.0–0.2)

## 2021-08-10 LAB — MAGNESIUM: Magnesium: 2 mg/dL (ref 1.7–2.4)

## 2021-08-10 LAB — HEPATITIS B SURFACE ANTIBODY, QUANTITATIVE: Hep B S AB Quant (Post): 11.9 m[IU]/mL (ref >9.9)

## 2021-08-10 MED ORDER — LIDOCAINE 5 % EX PTCH
2.0000 | MEDICATED_PATCH | CUTANEOUS | Status: DC
  Administered 2021-08-10 – 2021-08-31 (×16): 2 via TRANSDERMAL
  Filled 2021-08-10 (×24): qty 2

## 2021-08-10 MED ORDER — CYCLOBENZAPRINE HCL 10 MG PO TABS
10.0000 mg | ORAL_TABLET | Freq: Every day | ORAL | Status: DC
  Administered 2021-08-12: 10 mg via ORAL
  Filled 2021-08-10: qty 1

## 2021-08-10 MED ORDER — OXYCODONE-ACETAMINOPHEN 5-325 MG PO TABS
1.0000 | ORAL_TABLET | Freq: Four times a day (QID) | ORAL | Status: DC | PRN
  Administered 2021-08-10 – 2021-08-25 (×31): 1 via ORAL
  Filled 2021-08-10 (×32): qty 1

## 2021-08-10 MED ORDER — GABAPENTIN 100 MG PO CAPS
100.0000 mg | ORAL_CAPSULE | Freq: Three times a day (TID) | ORAL | Status: DC
  Administered 2021-08-10 – 2021-08-13 (×8): 100 mg via ORAL
  Filled 2021-08-10 (×8): qty 1

## 2021-08-10 MED ORDER — TROLAMINE SALICYLATE 10 % EX CREA
TOPICAL_CREAM | CUTANEOUS | Status: DC | PRN
  Filled 2021-08-10 (×2): qty 85

## 2021-08-10 MED ORDER — CYCLOBENZAPRINE HCL 10 MG PO TABS
10.0000 mg | ORAL_TABLET | Freq: Three times a day (TID) | ORAL | Status: DC
  Administered 2021-08-10: 10 mg via ORAL
  Filled 2021-08-10: qty 1

## 2021-08-10 NOTE — TOC Progression Note (Addendum)
Transition of Care [facility]) - Progression Note    Patient Details  Name: [name] MRN: [id number] Date of Birth: 1961-05-26  Transition of Care [facility]) CM/SW [place], RN Phone Number: 08/10/2021, 9:29 AM  Clinical Narrative:     No bed offers at this time, I reached out to the local facilities to request them to take a look at him for a bed offer   [facility] has declined making a bed offer, [facility] made a bed offer I reviewed with the patient as well as [place], He requested to wait a couple of hours for one in [place], I explained that none in [place] has made an offer yet, agreed to wait until lunch time today    Expected Discharge Plan and Services                                                 Social Determinants of Health (SDOH) Interventions    Readmission Risk Interventions No flowsheet data found.

## 2021-08-10 NOTE — TOC Progression Note (Signed)
Transition of Care [facility]) - Progression Note    Patient Details  Name: [name] [name] MRN: [id number] Date of Birth: 09-Jul-1961  Transition of Care [facility]) CM/SW [place], RN Phone Number: 08/10/2021, 2:51 PM  Clinical Narrative:     The patient accepted the bed offer from [facility], Plan to [place] notified [facility]       Expected Discharge Plan and Services                                                 Social Determinants of Health (SDOH) Interventions    Readmission Risk Interventions No flowsheet data found.

## 2021-08-10 NOTE — Progress Notes (Signed)
PROGRESS NOTE    [name] [name]  HER:[id number] DOB: 04/10/1961 DOA: 08/08/2021 PCP: Pcp, No  142A/142A-AA   Assessment & Plan:   Principal Problem:   Lower back pain Active Problems:   End-stage renal disease on hemodialysis ([place])   Polysubstance abuse ([place])   Tobacco abuse   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 (H[facility])   Anemia in ESRD (end-stage renal disease) ([place])   HTN (hypertension)   Back pain   [name] [name] is a 60 y.o. male with medical history significant of ESRD-HD (MWF), hypertension, tobacco abuse, polysubstance abuse, diverticulitis, anemia, jugular vein thrombosis, GI bleeding, right pneumothorax, who presents with lower back pain.   Patient states that he has been having lower back pain for more than 1 week, which has been progressively worsening.  The pain is constant, sharp, 10 out of 10 in severity today, nonradiating.  The pain is so severe that he cannot walk.  On arrival he had to be lifted out of his vehicle by security and nursing staff.     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 Pt does not have alarming symptoms.   --back pain out of proportion to imaging finding  Plan: --cont Percocet 5 mg q4h PRN --reduce gabapentin to 100 mg TID due to ESRD --Flexeril --lidocaine patches  End-stage renal disease on hemodialysis (MWF) --iHD per nephro --cont phoslo   Polysubstance abuse and tobacco abuse -Did counseling about importance of quitting substance use and tobacco use --cont nicotine patch -Check UDS   Anemia in ESRD (end-stage renal disease) ([place]):   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  --Eliquis changed from 5 mg daily to 2.5 mg bid per Dr. Keturah Barre recommendation. --cont eliquis   HTN: -IV hydralazine as needed --cont amlodipine   DVT prophylaxis: [id number]:[id number] Code Status: Full code  Family Communication:  Level of care:  Med-Surg Dispo:   The patient is from: home Anticipated d/c is to: SNF Anticipated d/c date is: whenever bed available  Patient currently is ready to d/c.   Subjective and Interval History:  Pt was very somnolent due to all the pain meds for his back pain, but when OT worked with him, pt complained of so much pain he couldn't even turn himself.     Objective: Vitals:   08/09/21 1646 08/09/21 2006 08/10/21 0806 08/10/21 1603  BP: (!) 108/48 116/65 (!) 90/54 (!) 102/55  Pulse: 94 97 98 (!) 102  Resp: (!) 25 17 18 17   Temp: 98.2 F (36.8 C) 98.7 F (37.1 C) 99.4 F (37.4 C) (!) 97.3 F (36.3 C)  TempSrc:    Axillary  SpO2: 94% 100% 99% 94%  Weight:      Height:       No intake or output data in the 24 hours ending 08/10/21 1621  Filed Weights   08/08/21 0625  Weight: 91 kg    Examination:   Constitutional: NAD, very sleepy, difficult to wake HEENT: conjunctivae and lids normal CV: No cyanosis.   RESP: normal respiratory effort, on RA Extremities: No effusions, edema in BLE SKIN: warm, dry   Data Reviewed: I have personally reviewed following labs and imaging studies  CBC: Recent Labs  Lab 08/08/21 0816 08/10/21 0347  WBC 9.3 8.8  NEUTROABS 7.6  --   HGB 9.6* 10.0*  HCT 30.3* 31.9*  MCV 95.3 93.3  PLT 136* 263   Basic Metabolic Panel: Recent  Labs  Lab 08/08/21 0816 08/09/21 0451 08/10/21 0347  NA 139 137 137  K 4.6 5.1 4.4  CL 101 101 99  CO2 28 26 29   GLUCOSE 93 107* 83  BUN 49* 67* 47*  CREATININE 8.43* 10.22* 7.87*  CALCIUM 9.7 9.5 9.3  MG  --   --  2.0   GFR: Estimated Creatinine Clearance: 11.7 mL/min (A) (by C-G formula based on SCr of 7.87 mg/dL (H)). Liver Function Tests: Recent Labs  Lab 08/08/21 0816  AST 16  ALT 13  ALKPHOS 79  BILITOT 1.1  PROT 6.9  ALBUMIN 2.6*   No results for input(s): LIPASE, AMYLASE in the last 168 hours. No results for input(s): AMMONIA in the last 168 hours. Coagulation Profile: No results for  input(s): INR, PROTIME in the last 168 hours. Cardiac Enzymes: No results for input(s): CKTOTAL, CKMB, CKMBINDEX, TROPONINI in the last 168 hours. BNP (last 3 results) No results for input(s): PROBNP in the last 8760 hours. HbA1C: No results for input(s): HGBA1C in the last 72 hours. CBG: No results for input(s): GLUCAP in the last 168 hours. Lipid Profile: No results for input(s): CHOL, HDL, LDLCALC, TRIG, CHOLHDL, LDLDIRECT in the last 72 hours. Thyroid Function Tests: No results for input(s): TSH, T4TOTAL, FREET4, T3FREE, THYROIDAB in the last 72 hours. Anemia Panel: No results for input(s): VITAMINB12, FOLATE, FERRITIN, TIBC, IRON, RETICCTPCT in the last 72 hours. Sepsis Labs: No results for input(s): PROCALCITON, LATICACIDVEN in the last 168 hours.  Recent Results (from the past 240 hour(s))  Resp Panel by RT-PCR (Flu A&B, Covid) Nasopharyngeal Swab     Status: None   Collection Time: 08/08/21  1:22 PM   Specimen: Nasopharyngeal Swab; Nasopharyngeal(NP) swabs in vial transport medium  Result Value Ref Range Status   SARS Coronavirus 2 by RT PCR NEGATIVE NEGATIVE Final    Comment: (NOTE) SARS-CoV-2 target nucleic acids are NOT DETECTED.  The SARS-CoV-2 RNA is generally detectable in upper respiratory specimens during the acute phase of infection. The lowest concentration of SARS-CoV-2 viral copies this assay can detect is 138 copies/mL. A negative result does not preclude SARS-Cov-2 infection and should not be used as the sole basis for treatment or other patient management decisions. A negative result may occur with  improper specimen collection/handling, submission of specimen other than nasopharyngeal swab, presence of viral mutation(s) within the areas targeted by this assay, and inadequate number of viral copies(<138 copies/mL). A negative result must be combined with clinical observations, patient history, and epidemiological information. The expected result is  Negative.  Fact Sheet for Patients:  EntrepreneurPulse.com.au  Fact Sheet for Healthcare Providers:  IncredibleEmployment.be  This test is no t yet approved or cleared by the [place] FDA and  has been authorized for detection and/or diagnosis of SARS-CoV-2 by FDA under an Emergency Use Authorization (EUA). This EUA will remain  in effect (meaning this test can be used) for the duration of the COVID-19 declaration under Section 564(b)(1) of the Act, 21 U.S.C.section 360bbb-3(b)(1), unless the authorization is terminated  or revoked sooner.       Influenza A by PCR NEGATIVE NEGATIVE Final   Influenza B by PCR NEGATIVE NEGATIVE Final    Comment: (NOTE) The Xpert Xpress SARS-CoV-2/FLU/RSV plus assay is intended as an aid in the diagnosis of influenza from Nasopharyngeal swab specimens and should not be used as a sole basis for treatment. Nasal washings and aspirates are unacceptable for Xpert Xpress SARS-CoV-2/FLU/RSV testing.  Fact Sheet for Patients: EntrepreneurPulse.com.au  Fact Sheet for Healthcare Providers: IncredibleEmployment.be  This test is not yet approved or cleared by the [place] FDA and has been authorized for detection and/or diagnosis of SARS-CoV-2 by FDA under an Emergency Use Authorization (EUA). This EUA will remain in effect (meaning this test can be used) for the duration of the COVID-19 declaration under Section 564(b)(1) of the Act, 21 U.S.C. section 360bbb-3(b)(1), unless the authorization is terminated or revoked.  Performed at [facility], [address] [place], [place] [address]       Radiology Studies: No results found.   Scheduled Meds:  amLODipine  5 mg Oral QPM   apixaban  2.5 mg Oral BID   calcium acetate  2,668 mg Oral TID WC   Chlorhexidine Gluconate Cloth  6 each Topical Q0600   [START ON 08/11/2021] cyclobenzaprine  10 mg Oral QHS    gabapentin  100 mg Oral TID   lidocaine  2 patch Transdermal Q24H   nicotine  21 mg Transdermal Daily   Continuous Infusions:   LOS: 1 day     Enzo Bi, MD [facility] If 7PM-7AM, please contact night-coverage 08/10/2021, 4:21 PM

## 2021-08-10 NOTE — Progress Notes (Signed)
Occupational Therapy Treatment Patient Details Name: [name] [name] MRN: [id number] DOB: Aug 19, 1961 Today's Date: 08/10/2021    History of present illness Pt is a 60 y.o. male with medical history significant of ESRD-HD (MWF), hypertension, tobacco abuse, polysubstance abuse, diverticulitis, anemia, jugular vein thrombosis, GI bleeding, right pneumothorax, who presents with lower back pain. 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   OT comments  Upon entering the room, pt supine in bed. Pt reports significant amount of pain but still agreeable to OT intervention. Pt is able to bring hands towards face for self care tasks but does have increased difficulty and pain with R UE AROM secondary to pain. OT educated and discussed log roll technique for pain management. Pt able to reach L UE across midline to bed rail with min A and assistance for L LE. At this point, pt having full body spasms and needing total A secondary to pain. However, pt did not go rigid or into full extension with spasm. Pt returning to supine and becomes very tearful and is fearful of discharge. OT provided therapeutic use of self and continued to encourage pt. OT continues to recommend short term rehab program to address functional deficits at discharge.    Follow Up Recommendations  SNF    Equipment Recommendations  Other (comment) (defer to next venue of care)       Precautions / Restrictions Precautions Precautions: Fall       Mobility Bed Mobility Overal bed mobility: Needs Assistance Bed Mobility: Rolling Rolling: Total assist         General bed mobility comments: severe pain with rolling    Transfers                 General transfer comment: not attempted secondary to pain        ADL either performed or assessed with clinical judgement      Vision Patient Visual Report: No change from baseline            Cognition Arousal/Alertness: Awake/alert Behavior During Therapy: WFL for tasks assessed/performed Overall Cognitive Status: No family/caregiver present to determine baseline cognitive functioning                                                     Pertinent Vitals/ Pain       Pain Assessment: Faces Faces Pain Scale: Hurts worst Pain Location: Low back pain Pain Descriptors / Indicators: Aching;Sore;Moaning;Grimacing Pain Intervention(s): Monitored during session;Repositioned;Limited activity within patient's tolerance         Frequency  Min 1X/week        Progress Toward Goals  OT Goals(current goals can now be found in the care plan section)  Progress towards OT goals: Progressing toward goals  Acute Rehab OT Goals Patient Stated Goal: to have less pain OT Goal Formulation: With patient Time For Goal Achievement: 08/23/21 Potential to Achieve Goals: Fair  Plan Frequency remains appropriate       AM-PAC OT "6 Clicks" Daily Activity     Outcome Measure   Help from another person eating meals?: A Little Help from another person taking care of personal grooming?: A Little Help from another person toileting, which includes using toliet, bedpan,  or urinal?: Total Help from another person bathing (including washing, rinsing, drying)?: Total Help from another person to put on and taking off regular upper body clothing?: A Lot Help from another person to put on and taking off regular lower body clothing?: Total 6 Click Score: 11    End of Session    OT Visit Diagnosis: Muscle weakness (generalized) (M62.81);Pain   Activity Tolerance Patient limited by pain   Patient Left in bed;with call bell/phone within reach;with bed alarm set   Nurse Communication Mobility status;Patient requests pain meds        Time: 7356-7014 OT Time Calculation (min): 35 min  Charges: OT  General Charges $OT Visit: 1 Visit OT Treatments $Therapeutic Activity: 23-37 mins  Darleen Crocker, MS, OTR/L , CBIS ascom [phone]  08/10/21, 4:18 PM

## 2021-08-10 NOTE — Progress Notes (Signed)
[facility]  ROUNDING NOTE   Subjective:   [name] [name] is a 60 y.o. male with medical conditions including anemia, CHF, hypertension, and ESRD on dialysis. Patient has presented to ED with progressive back pain.   Patient states he has always had back pain but it has increased in the past few days. Denies known precipitating factors. Chest X-ray of lumbar spine shows multilevel degenerative changes and stenosis, greatest at L4-L5.   Patient is known to our practice and receives dialysis at [facility] supervised by Dr Juleen China. We have been consulted to manage dialysis treatments.   Patient seen resting quietly in bed Very drowsy Ate small amount of breakfast Denies nausea States back pain is there when he moves   Objective:  Vital signs in last 24 hours:  Temp:  [98.1 F (36.7 C)-99.4 F (37.4 C)] 99.4 F (37.4 C) (09/01 0806) Pulse Rate:  [81-98] 98 (09/01 0806) Resp:  [0-33] 18 (09/01 0806) BP: (90-122)/(33-76) 90/54 (09/01 0806) SpO2:  [84 %-100 %] 99 % (09/01 0806)  Weight change:  Filed Weights   08/08/21 0625  Weight: 91 kg    Intake/Output: I/O last 3 completed shifts: In: -  Out: 2000 [Other:2000]   Intake/Output this shift:  No intake/output data recorded.  Physical Exam: General: NAD, resting in bed  Head: Normocephalic, atraumatic. Moist oral mucosal membranes  Eyes: Anicteric  Lungs:  Clear to auscultation, normal effort, O2 2L  Heart: Regular rate and rhythm  Abdomen:  Soft, nontender  Extremities:  1+ peripheral edema.  Neurologic: Alert, moving all four extremities  Skin: No lesions  Access: Rt AVG    Basic Metabolic Panel: Recent Labs  Lab 08/08/21 0816 08/09/21 0451 08/10/21 0347  NA 139 137 137  K 4.6 5.1 4.4  CL 101 101 99  CO2 28 26 29   GLUCOSE 93 107* 83  BUN 49* 67* 47*  CREATININE 8.43* 10.22* 7.87*  CALCIUM 9.7 9.5 9.3  MG  --   --  2.0     Liver Function Tests: Recent Labs  Lab 08/08/21 0816   AST 16  ALT 13  ALKPHOS 79  BILITOT 1.1  PROT 6.9  ALBUMIN 2.6*    No results for input(s): LIPASE, AMYLASE in the last 168 hours. No results for input(s): AMMONIA in the last 168 hours.  CBC: Recent Labs  Lab 08/08/21 0816 08/10/21 0347  WBC 9.3 8.8  NEUTROABS 7.6  --   HGB 9.6* 10.0*  HCT 30.3* 31.9*  MCV 95.3 93.3  PLT 136* 150     Cardiac Enzymes: No results for input(s): CKTOTAL, CKMB, CKMBINDEX, TROPONINI in the last 168 hours.  BNP: Invalid input(s): POCBNP  CBG: No results for input(s): GLUCAP in the last 168 hours.  Microbiology: Results for orders placed or performed during the hospital encounter of 08/08/21  Resp Panel by RT-PCR (Flu A&B, Covid) Nasopharyngeal Swab     Status: None   Collection Time: 08/08/21  1:22 PM   Specimen: Nasopharyngeal Swab; Nasopharyngeal(NP) swabs in vial transport medium  Result Value Ref Range Status   SARS Coronavirus 2 by RT PCR NEGATIVE NEGATIVE Final    Comment: (NOTE) SARS-CoV-2 target nucleic acids are NOT DETECTED.  The SARS-CoV-2 RNA is generally detectable in upper respiratory specimens during the acute phase of infection. The lowest concentration of SARS-CoV-2 viral copies this assay can detect is 138 copies/mL. A negative result does not preclude SARS-Cov-2 infection and should not be used as the sole basis  for treatment or other patient management decisions. A negative result may occur with  improper specimen collection/handling, submission of specimen other than nasopharyngeal swab, presence of viral mutation(s) within the areas targeted by this assay, and inadequate number of viral copies(<138 copies/mL). A negative result must be combined with clinical observations, patient history, and epidemiological information. The expected result is Negative.  Fact Sheet for Patients:  EntrepreneurPulse.com.au  Fact Sheet for Healthcare Providers:   IncredibleEmployment.be  This test is no t yet approved or cleared by the [place] FDA and  has been authorized for detection and/or diagnosis of SARS-CoV-2 by FDA under an Emergency Use Authorization (EUA). This EUA will remain  in effect (meaning this test can be used) for the duration of the COVID-19 declaration under Section 564(b)(1) of the Act, 21 U.S.C.section 360bbb-3(b)(1), unless the authorization is terminated  or revoked sooner.       Influenza A by PCR NEGATIVE NEGATIVE Final   Influenza B by PCR NEGATIVE NEGATIVE Final    Comment: (NOTE) The Xpert Xpress SARS-CoV-2/FLU/RSV plus assay is intended as an aid in the diagnosis of influenza from Nasopharyngeal swab specimens and should not be used as a sole basis for treatment. Nasal washings and aspirates are unacceptable for Xpert Xpress SARS-CoV-2/FLU/RSV testing.  Fact Sheet for Patients: EntrepreneurPulse.com.au  Fact Sheet for Healthcare Providers: IncredibleEmployment.be  This test is not yet approved or cleared by the [place] FDA and has been authorized for detection and/or diagnosis of SARS-CoV-2 by FDA under an Emergency Use Authorization (EUA). This EUA will remain in effect (meaning this test can be used) for the duration of the COVID-19 declaration under Section 564(b)(1) of the Act, 21 U.S.C. section 360bbb-3(b)(1), unless the authorization is terminated or revoked.  Performed at [facility], [address] [place], [place] [address]     Coagulation Studies: No results for input(s): LABPROT, INR in the last 72 hours.  Urinalysis: No results for input(s): COLORURINE, LABSPEC, PHURINE, GLUCOSEU, HGBUR, BILIRUBINUR, KETONESUR, PROTEINUR, UROBILINOGEN, NITRITE, LEUKOCYTESUR in the last 72 hours.  Invalid input(s): APPERANCEUR    Imaging: No results found.   Medications:      amLODipine  5 mg Oral QPM   apixaban   2.5 mg Oral BID   calcium acetate  2,668 mg Oral TID WC   Chlorhexidine Gluconate Cloth  6 each Topical Q0600   cyclobenzaprine  10 mg Oral TID   gabapentin  300 mg Oral TID   lidocaine  2 patch Transdermal Q24H   nicotine  21 mg Transdermal Daily   acetaminophen, hydrALAZINE, ondansetron (ZOFRAN) IV, oxyCODONE-acetaminophen, trolamine salicylate  Assessment/ Plan:  [name] is a 60 y.o.  male with medical conditions including anemia, CHF, hypertension, and ESRD on dialysis. Patient has presented to ED with progressive back pain.  Camilla Raven/MWF/Rt AVG  1. End stage renal disease on dialysis Will maintain outpatient schedule if possible Received dialysis yesterday, UF goal 2L achieved Next treatment scheduled for Friday  2. Anemia of chronic kidney disease Lab Results  Component Value Date   HGB 10.0 (L) 08/10/2021  Hgb at target  3. Secondary Hyperparathyroidism:  Lab Results  Component Value Date   PTH 583.2 (H) 12/11/2012   CALCIUM 9.3 08/10/2021   CAION 1.27 (H) 12/26/2012   PHOS 7.0 (H) 04/16/2019  Calcium at goal, phosphorus elevated Calcium acetate with meals outpatient Will recheck phosphorus with am labs   4. 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 LE edema  Managed with dialysis, slightly improved today   LOS: 1 [place] 9/1/202212:09 PM

## 2021-08-11 ENCOUNTER — Inpatient Hospital Stay: Payer: Medicare Other

## 2021-08-11 DIAGNOSIS — M5440 Lumbago with sciatica, unspecified side: Secondary | ICD-10-CM | POA: Diagnosis not present

## 2021-08-11 LAB — BASIC METABOLIC PANEL
Anion gap: 9 (ref 5–15)
BUN: 65 mg/dL — ABNORMAL HIGH (ref 6–20)
CO2: 28 mmol/L (ref 22–32)
Calcium: 9.9 mg/dL (ref 8.9–10.3)
Chloride: 99 mmol/L (ref 98–111)
Creatinine, Ser: 9.61 mg/dL — ABNORMAL HIGH (ref 0.61–1.24)
GFR, Estimated: 6 mL/min — ABNORMAL LOW (ref >60)
Glucose, Bld: 89 mg/dL (ref 70–99)
Potassium: 5 mmol/L (ref 3.5–5.1)
Sodium: 136 mmol/L (ref 135–145)

## 2021-08-11 LAB — CBC
HCT: 31.1 % — ABNORMAL LOW (ref 39.0–52.0)
Hemoglobin: 9.9 g/dL — ABNORMAL LOW (ref 13.0–17.0)
MCH: 30.6 pg (ref 26.0–34.0)
MCHC: 31.8 g/dL (ref 30.0–36.0)
MCV: 96 fL (ref 80.0–100.0)
Platelets: 145 10*3/uL — ABNORMAL LOW (ref 150–400)
RBC: 3.24 MIL/uL — ABNORMAL LOW (ref 4.22–5.81)
RDW: 16.4 % — ABNORMAL HIGH (ref 11.5–15.5)
WBC: 8.5 10*3/uL (ref 4.0–10.5)
nRBC: 0 % (ref 0.0–0.2)

## 2021-08-11 LAB — MAGNESIUM: Magnesium: 2.2 mg/dL (ref 1.7–2.4)

## 2021-08-11 LAB — LACTIC ACID, PLASMA
Lactic Acid, Venous: 0.9 mmol/L (ref 0.5–1.9)
Lactic Acid, Venous: 1.1 mmol/L (ref 0.5–1.9)

## 2021-08-11 LAB — HEPATITIS B CORE ANTIBODY, TOTAL: Hep B Core Total Ab: NONREACTIVE

## 2021-08-11 LAB — PROCALCITONIN: Procalcitonin: 5.82 ng/mL

## 2021-08-11 LAB — PHOSPHORUS: Phosphorus: 7.6 mg/dL — ABNORMAL HIGH (ref 2.5–4.6)

## 2021-08-11 MED ORDER — SODIUM CHLORIDE 0.9 % IV BOLUS: 250.0000 mL | Freq: Once | INTRAVENOUS | Status: DC

## 2021-08-11 MED ORDER — VANCOMYCIN HCL 2000 MG/400ML IV SOLN
2000.0000 mg | Freq: Once | INTRAVENOUS | Status: AC
  Administered 2021-08-11: 2000 mg via INTRAVENOUS
  Filled 2021-08-11: qty 400

## 2021-08-11 MED ORDER — SODIUM CHLORIDE 0.9 % IV BOLUS: 500.0000 mL | Freq: Once | INTRAVENOUS | Status: DC

## 2021-08-11 MED ORDER — SODIUM CHLORIDE 0.9 % IV SOLN
2.0000 g | Freq: Two times a day (BID) | INTRAVENOUS | Status: DC
  Administered 2021-08-11: 2 g via INTRAVENOUS
  Filled 2021-08-11 (×2): qty 20

## 2021-08-11 MED ORDER — VANCOMYCIN HCL IN DEXTROSE 1-5 GM/200ML-% IV SOLN: 1000.0000 mg | INTRAVENOUS | Status: DC

## 2021-08-11 NOTE — Progress Notes (Signed)
Patient care transferred to primary RN in dialysis suite

## 2021-08-11 NOTE — Progress Notes (Signed)
Pharmacy Antibiotic Note  [name] [name] is a 60 y.o. male admitted on 08/08/2021 with  possible spinal vs paraspinal infection .  Pharmacy has been consulted for Vancomycin dosing. Patient is ESRD on 3 times a week dialysis on a Monday, Wednesday, Friday schedule.  Plan: Vancomycin 2000 mg IV loading dose followed by Vancomycin 1000 mg IV after each dialysis session Check trough level prior to 3rd dose  Continue Ceftriaxone 2g IV q12h    Height: 6\' 1"  (185.4 cm) Weight: 91 kg (200 lb 9.9 oz) IBW/kg (Calculated) : 79.9  Temp (24hrs), Avg:99.2 F (37.3 C), Min:97.7 F (36.5 C), Max:101.5 F (38.6 C)  Recent Labs  Lab 08/08/21 0816 08/09/21 0451 08/10/21 0347 08/11/21 0226 08/11/21 1747  WBC 9.3  --  8.8 8.5  --   CREATININE 8.43* 10.22* 7.87* 9.61*  --   LATICACIDVEN  --   --   --   --  0.9    Estimated Creatinine Clearance: 9.6 mL/min (A) (by C-G formula based on SCr of 9.61 mg/dL (H)).    Allergies  Allergen Reactions   Other     Other reaction(s): Other (See Comments) blister   Tape Other (See Comments)    Plastic Tape Causes blister    Antimicrobials this admission: Ceftriaxone 9/2 >>  Vancomcyin 9/2 >>   Dose adjustments this admission:  Microbiology results: 9/2 BCx: pending  Thank you for allowing pharmacy to be a part of this patient's care.  Paulina Fusi, PharmD, BCPS 08/11/2021 7:40 PM

## 2021-08-11 NOTE — Progress Notes (Signed)
Hemodialysis patient in need to clinic transfer for rehab placement. Patient agreed to transfer to [facility] from [place]. Plan is for patient to discharge to [facility]. Referral being sent to [facility]. Insurance verification and clinic acceptance may take a few days. With the holiday weekend, expecting a chair time next week. Please contact me with any dialysis placement concerns.  Elvera Bicker Dialysis Coordinator [phone]

## 2021-08-11 NOTE — Progress Notes (Addendum)
[facility]  ROUNDING NOTE   Subjective:   [name] [name] is a 60 y.o. male with medical conditions including anemia, CHF, hypertension, and ESRD on dialysis. Patient has presented to ED with progressive back pain.   Patient states he has always had back pain but it has increased in the past few days. Denies known precipitating factors. Chest X-ray of lumbar spine shows multilevel degenerative changes and stenosis, greatest at L4-L5.   Patient is known to our practice and receives dialysis at [facility] supervised by Dr Juleen China. We have been consulted to manage dialysis treatments.   Patient seen during dialysis   HEMODIALYSIS FLOWSHEET:  Blood Flow Rate (mL/min): 400 mL/min Arterial Pressure (mmHg): -120 mmHg Venous Pressure (mmHg): 190 mmHg Transmembrane Pressure (mmHg): 70 mmHg Ultrafiltration Rate (mL/min): 830 mL/min Dialysate Flow Rate (mL/min): 500 ml/min Conductivity: Machine : 14.4 Conductivity: Machine : 14.4 Dialysis Fluid Bolus: Normal Saline Bolus Amount (mL): 200 mL  Resting comfortable at start of treatment As pain medication dialyzed out, patient become more uncomfortable Pain medication requested from nursing   Objective:  Vital signs in last 24 hours:  Temp:  [97.3 F (36.3 C)-98.3 F (36.8 C)] 98.1 F (36.7 C) (09/02 0814) Pulse Rate:  [76-105] 76 (09/02 1115) Resp:  [0-27] 15 (09/02 1115) BP: (95-110)/(54-64) 105/64 (09/02 1115) SpO2:  [93 %-100 %] 100 % (09/02 0814)  Weight change:  Filed Weights   08/08/21 0625  Weight: 91 kg    Intake/Output: No intake/output data recorded.   Intake/Output this shift:  No intake/output data recorded.  Physical Exam: General: NAD, resting in bed  Head: Normocephalic, atraumatic. Moist oral mucosal membranes  Eyes: Anicteric  Lungs:  Clear to auscultation, normal effort, O2 2L  Heart: Regular rate and rhythm  Abdomen:  Soft, nontender  Extremities:  trace peripheral edema.   Neurologic: Alert, moving all four extremities  Skin: No lesions  Access: Rt AVG    Basic Metabolic Panel: Recent Labs  Lab 08/08/21 0816 08/09/21 0451 08/10/21 0347 08/11/21 0226  NA 139 137 137 136  K 4.6 5.1 4.4 5.0  CL 101 101 99 99  CO2 28 26 29 28   GLUCOSE 93 107* 83 89  BUN 49* 67* 47* 65*  CREATININE 8.43* 10.22* 7.87* 9.61*  CALCIUM 9.7 9.5 9.3 9.9  MG  --   --  2.0 2.2  PHOS  --   --   --  7.6*     Liver Function Tests: Recent Labs  Lab 08/08/21 0816  AST 16  ALT 13  ALKPHOS 79  BILITOT 1.1  PROT 6.9  ALBUMIN 2.6*    No results for input(s): LIPASE, AMYLASE in the last 168 hours. No results for input(s): AMMONIA in the last 168 hours.  CBC: Recent Labs  Lab 08/08/21 0816 08/10/21 0347 08/11/21 0226  WBC 9.3 8.8 8.5  NEUTROABS 7.6  --   --   HGB 9.6* 10.0* 9.9*  HCT 30.3* 31.9* 31.1*  MCV 95.3 93.3 96.0  PLT 136* 150 145*     Cardiac Enzymes: No results for input(s): CKTOTAL, CKMB, CKMBINDEX, TROPONINI in the last 168 hours.  BNP: Invalid input(s): POCBNP  CBG: No results for input(s): GLUCAP in the last 168 hours.  Microbiology: Results for orders placed or performed during the hospital encounter of 08/08/21  Resp Panel by RT-PCR (Flu A&B, Covid) Nasopharyngeal Swab     Status: None   Collection Time: 08/08/21  1:22 PM   Specimen: Nasopharyngeal Swab;  Nasopharyngeal(NP) swabs in vial transport medium  Result Value Ref Range Status   SARS Coronavirus 2 by RT PCR NEGATIVE NEGATIVE Final    Comment: (NOTE) SARS-CoV-2 target nucleic acids are NOT DETECTED.  The SARS-CoV-2 RNA is generally detectable in upper respiratory specimens during the acute phase of infection. The lowest concentration of SARS-CoV-2 viral copies this assay can detect is 138 copies/mL. A negative result does not preclude SARS-Cov-2 infection and should not be used as the sole basis for treatment or other patient management decisions. A negative result may  occur with  improper specimen collection/handling, submission of specimen other than nasopharyngeal swab, presence of viral mutation(s) within the areas targeted by this assay, and inadequate number of viral copies(<138 copies/mL). A negative result must be combined with clinical observations, patient history, and epidemiological information. The expected result is Negative.  Fact Sheet for Patients:  EntrepreneurPulse.com.au  Fact Sheet for Healthcare Providers:  IncredibleEmployment.be  This test is no t yet approved or cleared by the [place] FDA and  has been authorized for detection and/or diagnosis of SARS-CoV-2 by FDA under an Emergency Use Authorization (EUA). This EUA will remain  in effect (meaning this test can be used) for the duration of the COVID-19 declaration under Section 564(b)(1) of the Act, 21 U.S.C.section 360bbb-3(b)(1), unless the authorization is terminated  or revoked sooner.       Influenza A by PCR NEGATIVE NEGATIVE Final   Influenza B by PCR NEGATIVE NEGATIVE Final    Comment: (NOTE) The Xpert Xpress SARS-CoV-2/FLU/RSV plus assay is intended as an aid in the diagnosis of influenza from Nasopharyngeal swab specimens and should not be used as a sole basis for treatment. Nasal washings and aspirates are unacceptable for Xpert Xpress SARS-CoV-2/FLU/RSV testing.  Fact Sheet for Patients: EntrepreneurPulse.com.au  Fact Sheet for Healthcare Providers: IncredibleEmployment.be  This test is not yet approved or cleared by the [place] FDA and has been authorized for detection and/or diagnosis of SARS-CoV-2 by FDA under an Emergency Use Authorization (EUA). This EUA will remain in effect (meaning this test can be used) for the duration of the COVID-19 declaration under Section 564(b)(1) of the Act, 21 U.S.C. section 360bbb-3(b)(1), unless the authorization is terminated  or revoked.  Performed at [facility], [address] [place], [place] [address]     Coagulation Studies: No results for input(s): LABPROT, INR in the last 72 hours.  Urinalysis: No results for input(s): COLORURINE, LABSPEC, PHURINE, GLUCOSEU, HGBUR, BILIRUBINUR, KETONESUR, PROTEINUR, UROBILINOGEN, NITRITE, LEUKOCYTESUR in the last 72 hours.  Invalid input(s): APPERANCEUR    Imaging: No results found.   Medications:      amLODipine  5 mg Oral QPM   apixaban  2.5 mg Oral BID   calcium acetate  2,668 mg Oral TID WC   Chlorhexidine Gluconate Cloth  6 each Topical Q0600   cyclobenzaprine  10 mg Oral QHS   gabapentin  100 mg Oral TID   lidocaine  2 patch Transdermal Q24H   nicotine  21 mg Transdermal Daily   acetaminophen, hydrALAZINE, ondansetron (ZOFRAN) IV, oxyCODONE-acetaminophen, trolamine salicylate  Assessment/ Plan:  [name] is a 60 y.o.  male with medical conditions including anemia, CHF, hypertension, and ESRD on dialysis. Patient has presented to ED with progressive back pain.  [facility]/MWF/Rt AVG  1. End stage renal disease on dialysis Will maintain outpatient schedule if possible Dialysis received today, UF 1.5L acheived Next treatment scheduled for Monday Rehab needed at discharge, bed  accepted in [place]. Dialysis coordinator notified and will make outpatient arrangements accordingly. Will order needed testing for placement.   2. Anemia of chronic kidney disease Lab Results  Component Value Date   HGB 9.9 (L) 08/11/2021  Hgb at target  3. Secondary Hyperparathyroidism:  Lab Results  Component Value Date   PTH 583.2 (H) 12/11/2012   CALCIUM 9.9 08/11/2021   CAION 1.27 (H) 12/26/2012   PHOS 7.6 (H) 08/11/2021  Calcium at goal, phosphorus elevated Calcium acetate with meals    4. 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 LE edema Managed with dialysis, improved today   LOS: 2 [place] 9/2/202212:36 PM

## 2021-08-11 NOTE — Progress Notes (Signed)
   08/11/21 1637  Assess: MEWS Score  Temp 100.3 F (37.9 C)  BP (!) 93/46  Pulse Rate (!) 108  Resp 16  Level of Consciousness Alert  SpO2 100 %  O2 Device Nasal Cannula  O2 Flow Rate (L/min) 2 L/min  Assess: MEWS Score  MEWS Temp 0  MEWS Systolic 1  MEWS Pulse 1  MEWS RR 0  MEWS LOC 0  MEWS Score 2  MEWS Score Color Yellow  Assess: if the MEWS score is Yellow or Red  Were vital signs taken at a resting state? Yes  Focused Assessment Change from prior assessment (see assessment flowsheet)  Does the patient meet 2 or more of the SIRS criteria? No  Does the patient have a confirmed or suspected source of infection? No  MEWS guidelines implemented *See Row Information* Yes  Treat  Pain Scale 0-10  Take Vital Signs  Increase Vital Sign Frequency  Yellow: Q 2hr X 2 then Q 4hr X 2, if remains yellow, continue Q 4hrs  Escalate  MEWS: Escalate Yellow: discuss with charge nurse/RN and consider discussing with provider and RRT  Notify: Charge Nurse/RN  Name of Charge Nurse/RN Notified Lorelle Gibbs RN  Date Charge Nurse/RN Notified 08/11/21  Time Charge Nurse/RN Notified [id number]  Notify: Provider  Provider Name/Title Dr. Billie Ruddy  Date Provider Notified 08/11/21  Time Provider Notified 1646  Notification Type Page  Notification Reason Other (Comment) (elevating temperature/ Increase HR)  Provider response Other (Comment);See new orders (MD Ordered new labs.)  Date of Provider Response 08/11/21  Time of Provider Response 1646  Document  Patient Outcome Not stable and remains on department  Progress note created (see row info) Yes  Assess: SIRS CRITERIA  SIRS Temperature  0  SIRS Pulse 1  SIRS Respirations  0  SIRS WBC 0  SIRS Score Sum  1

## 2021-08-11 NOTE — Progress Notes (Signed)
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 male with medical history significant of ESRD-HD (MWF), hypertension, tobacco abuse, polysubstance abuse, diverticulitis, anemia, jugular vein thrombosis, GI bleeding, right pneumothorax, who presents with lower back pain.   Patient states that he has been having lower back pain for more than 1 week, which has been progressively worsening.  The pain is constant, sharp, 10 out of 10 in severity today, nonradiating.  The pain is so severe that he cannot walk.  On arrival he had to be lifted out of his vehicle by security and nursing staff.     Fever and tachycardia --developed fever to 101.5 today, with tachycardia --in the setting of severe back pain, I am concerned about spinal or paraspinal infection. Plan: --blood cx, lactic acid, procal --empiric abx vanc and ceftriaxone after blood cx obtained --MRI w wo spine (confirmed with radiologist that [place] contrast is ok to use in ESRD pt)  Lower back pain:  CT scan showed multilevel degenerative and canal and foraminal stenosis, greatest at L4-L5.  Pt did not have alarming symptoms.   --back pain out of proportion to imaging finding  Plan: --reduce Percocet to 5 mg q6h PRN due to somnolence --continue gabapentin 100 mg TID --Flexeril --lidocaine patches  End-stage renal disease on hemodialysis (MWF) --cont phoslo --requests extra HD session after MRI contrasted study.   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 Keturah Barre recommendation. --cont Eliquis   HTN, not currently active Hypotension --BP has been intermittently low in 90's --Hold amlodipine --small bolus if systolic <96   DVT prophylaxis: GE:ZMOQHUT Code Status: Full code  Family Communication:  Level of care: Med-Surg Dispo:   The patient is from: home Anticipated d/c is to: SNF Anticipated d/c date is: undetermined  Patient currently is NOT ready to d/c.  Need to rule out spinal infection.   Subjective and Interval History:  Pt again was somnolent from pain meds and noted to be in pain when pain meds wore off.  Reported spasms in his back and buttocks and pain shooting down his legs.    Later in afternoon, pt developed fever to 101 with tachycardia, now concerning for spinal or paraspinal infection.     Objective: Vitals:   08/11/21 1100 08/11/21 1115 08/11/21 1530 08/11/21 1637  BP: (!) 103/59 105/64 (!) 100/53 (!) 93/46  Pulse:  76 (!) 105 (!) 108  Resp: (!) 0 15  16  Temp:   (!) 101.5 F (38.6 C) 100.3 F (37.9 C)  TempSrc:   Oral Oral  SpO2:   95% 100%  Weight:      Height:       No intake or output data in the 24 hours ending 08/11/21 1758  Filed Weights   08/08/21  0625  Weight: 91 kg    Examination:   Constitutional: NAD, somnolent, woke up enough just to answer a few questions. HEENT: conjunctivae and lids normal, EOMI CV: No cyanosis.   RESP: normal respiratory effort, on RA Extremities: both arms swollen. SKIN: warm, dry   Data Reviewed: I have personally reviewed following labs and imaging studies  CBC: Recent Labs  Lab 08/08/21 0816 08/10/21 0347 08/11/21 0226  WBC 9.3 8.8 8.5  NEUTROABS 7.6  --   --   HGB 9.6* 10.0* 9.9*  HCT 30.3* 31.9* 31.1*  MCV 95.3 93.3 96.0  PLT 136* 150  169*   Basic Metabolic Panel: Recent Labs  Lab 08/08/21 0816 08/09/21 0451 08/10/21 0347 08/11/21 0226  NA 139 137 137 136  K 4.6 5.1 4.4 5.0  CL 101 101 99 99  CO2 28 26 29 28   GLUCOSE 93 107* 83 89  BUN 49* 67* 47* 65*  CREATININE 8.43* 10.22* 7.87* 9.61*  CALCIUM 9.7 9.5 9.3 9.9  MG  --   --  2.0 2.2  PHOS  --   --   --  7.6*   GFR: Estimated Creatinine Clearance: 9.6 mL/min (A) (by C-G formula based on SCr of 9.61 mg/dL (H)). Liver Function Tests: Recent Labs  Lab 08/08/21 0816  AST 16  ALT 13  ALKPHOS 79  BILITOT 1.1  PROT 6.9  ALBUMIN 2.6*   No results for input(s): LIPASE, AMYLASE in the last 168 hours. No results for input(s): AMMONIA in the last 168 hours. Coagulation Profile: No results for input(s): INR, PROTIME in the last 168 hours. Cardiac Enzymes: No results for input(s): CKTOTAL, CKMB, CKMBINDEX, TROPONINI in the last 168 hours. BNP (last 3 results) No results for input(s): PROBNP in the last 8760 hours. HbA1C: No results for input(s): HGBA1C in the last 72 hours. CBG: No results for input(s): GLUCAP in the last 168 hours. Lipid Profile: No results for input(s): CHOL, HDL, LDLCALC, TRIG, CHOLHDL, LDLDIRECT in the last 72 hours. Thyroid Function Tests: No results for input(s): TSH, T4TOTAL, FREET4, T3FREE, THYROIDAB in the last 72 hours. Anemia Panel: No results for input(s): VITAMINB12, FOLATE, FERRITIN, TIBC, IRON, RETICCTPCT in the last 72 hours. Sepsis Labs: No results for input(s): PROCALCITON, LATICACIDVEN in the last 168 hours.  Recent Results (from the past 240 hour(s))  Resp Panel by RT-PCR (Flu A&B, Covid) Nasopharyngeal Swab     Status: None   Collection Time: 08/08/21  1:22 PM   Specimen: Nasopharyngeal Swab; Nasopharyngeal(NP) swabs in vial transport medium  Result Value Ref Range Status   SARS Coronavirus 2 by RT PCR NEGATIVE NEGATIVE Final    Comment: (NOTE) SARS-CoV-2 target nucleic acids are NOT DETECTED.  The SARS-CoV-2  RNA is generally detectable in upper respiratory specimens during the acute phase of infection. The lowest concentration of SARS-CoV-2 viral copies this assay can detect is 138 copies/mL. A negative result does not preclude SARS-Cov-2 infection and should not be used as the sole basis for treatment or other patient management decisions. A negative result may occur with  improper specimen collection/handling, submission of specimen other than nasopharyngeal swab, presence of viral mutation(s) within the areas targeted by this assay, and inadequate number of viral copies(<138 copies/mL). A negative result must be combined with clinical observations, patient history, and epidemiological information. The expected result is Negative.  Fact Sheet for Patients:  EntrepreneurPulse.com.au  Fact Sheet for Healthcare Providers:  IncredibleEmployment.be  This test is no t yet approved or cleared by the  [place] Architectural technologist and  has been authorized for detection and/or diagnosis of SARS-CoV-2 by FDA under an Print production planner (EUA). This EUA will remain  in effect (meaning this test can be used) for the duration of the COVID-19 declaration under Section 564(b)(1) of the Act, 21 U.S.C.section 360bbb-3(b)(1), unless the authorization is terminated  or revoked sooner.       Influenza A by PCR NEGATIVE NEGATIVE Final   Influenza B by PCR NEGATIVE NEGATIVE Final    Comment: (NOTE) The Xpert Xpress SARS-CoV-2/FLU/RSV plus assay is intended as an aid in the diagnosis of influenza from Nasopharyngeal swab specimens and should not be used as a sole basis for treatment. Nasal washings and aspirates are unacceptable for Xpert Xpress SARS-CoV-2/FLU/RSV testing.  Fact Sheet for Patients: EntrepreneurPulse.com.au  Fact Sheet for Healthcare Providers: IncredibleEmployment.be  This test is not yet approved or cleared by the  [place] FDA and has been authorized for detection and/or diagnosis of SARS-CoV-2 by FDA under an Emergency Use Authorization (EUA). This EUA will remain in effect (meaning this test can be used) for the duration of the COVID-19 declaration under Section 564(b)(1) of the Act, 21 U.S.C. section 360bbb-3(b)(1), unless the authorization is terminated or revoked.  Performed at [facility], [address] [place], [place] [address]       Radiology Studies: DG Chest 1 View  Result Date: 08/11/2021 CLINICAL DATA:  Kidney damage. End-stage renal disease on hemodialysis. EXAM: CHEST  1 VIEW COMPARISON:  04/16/2015 and 08/18/2013 radiographs.  CT 03/06/2013. FINDINGS: 1352 hours. Two views obtained. Mild patient rotation to the right. Right IJ hemodialysis catheters project to the level of the inferior right atrium. There is progressive cardiac enlargement. There is chronic volume loss and pleural thickening in the right hemithorax which appears similar to the prior studies allowing for the rotation. The left lung is clear. There is no pneumothorax or significant pleural effusion. The bones appear unchanged. IMPRESSION: No evidence of acute cardiopulmonary process. Stable chronic volume loss and pleuroparenchymal scarring in the right hemithorax. Electronically Signed   By: Richardean Sale M.D.   On: 08/11/2021 16:01     Scheduled Meds:  amLODipine  5 mg Oral QPM   apixaban  2.5 mg Oral BID   calcium acetate  2,668 mg Oral TID WC   Chlorhexidine Gluconate Cloth  6 each Topical Q0600   cyclobenzaprine  10 mg Oral QHS   gabapentin  100 mg Oral TID   lidocaine  2 patch Transdermal Q24H   nicotine  21 mg Transdermal Daily   Continuous Infusions:   LOS: 2 days     Enzo Bi, MD [facility] If 7PM-7AM, please contact night-coverage 08/11/2021, 5:58 PM

## 2021-08-11 NOTE — Progress Notes (Signed)
Physical Therapy Treatment Patient Details Name: [name] [name] MRN: [id number] DOB: Sep 18, 1961 Today's Date: 08/11/2021    History of Present Illness Pt is a 60 y.o. male with medical history significant of ESRD-HD (MWF), hypertension, tobacco abuse, polysubstance abuse, diverticulitis, anemia, jugular vein thrombosis, GI bleeding, right pneumothorax, who presents with lower back pain. 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    PT Comments    Pt found in supine in 5/10 pain to his low back but willing to attempt mobility/OOB to chair.  Pt was able to tolerate low amplitude rolling in the bed but was unable to come fully to his side secondary to back increased back pain. Pt education/discussion provided on physiological benefits of activity and possible improvement of back pain if he were able to mobilize more and get to the chair.  Pt did try several times to progress bed mobility towards sitting up but was unable due to pain.  Pt tolerated below therex well with fair effort.  Pt will benefit from PT services in a SNF setting upon discharge to safely address deficits listed in patient problem list for decreased caregiver assistance and eventual return to PLOF.    Follow Up Recommendations  SNF     Equipment Recommendations  Rolling walker with 5" wheels;3in1 (PT)    Recommendations for Other Services       Precautions / Restrictions Precautions Precautions: Fall Restrictions Weight Bearing Restrictions: No    Mobility  Bed Mobility Overal bed mobility: Needs Assistance Bed Mobility: Rolling Rolling: +2 for physical assistance;Mod assist;Max assist         General bed mobility comments: Mod to max A with gentle low amplitude rolling left/right; pt declined to attempt to get up to EOB secondary to back pain with mobility     Transfers                    Ambulation/Gait                 Stairs             Wheelchair Mobility    Modified Rankin (Stroke Patients Only)       Balance                                            Cognition Arousal/Alertness: Awake/alert Behavior During Therapy: WFL for tasks assessed/performed Overall Cognitive Status: No family/caregiver present to determine baseline cognitive functioning                                        Exercises Total Joint Exercises Ankle Circles/Pumps: AROM;Strengthening;Both;10 reps;5 reps (with manual resistance) Quad Sets: Strengthening;Both;5 reps;10 reps Gluteal Sets: Strengthening;Both;5 reps;10 reps Short Arc Quad: AAROM;Strengthening;Both;10 reps Hip ABduction/ADduction: AAROM;Strengthening;Both;10 reps Straight Leg Raises: AAROM;Strengthening;Both;10 reps Other Exercises Other Exercises: Gentle low amplitude rolling left/right with cues for sequecing Other Exercises: Pt education provided on physiological benefits of activity and importance of progressing activity intensity towards OOB activity    General Comments        Pertinent Vitals/Pain Pain Assessment: 0-10 Pain Score: 5  Pain Location: Low back pain at rest, 9-10/10 with movement  Pain Descriptors / Indicators: Aching;Sore;Moaning;Grimacing Pain Intervention(s): Premedicated before session;Repositioned;Monitored during session    Home Living                      Prior Function            PT Goals (current goals can now be found in the care plan section) Progress towards PT goals: PT to reassess next treatment    Frequency    Min 2X/week      PT Plan Current plan remains appropriate    Co-evaluation              AM-PAC PT "6 Clicks" Mobility   Outcome Measure  Help needed turning from your back to your side while in a flat bed without using bedrails?: A Lot Help needed moving  from lying on your back to sitting on the side of a flat bed without using bedrails?: A Lot Help needed moving to and from a bed to a chair (including a wheelchair)?: A Lot Help needed standing up from a chair using your arms (e.g., wheelchair or bedside chair)?: A Lot Help needed to walk in hospital room?: Total Help needed climbing 3-5 steps with a railing? : Total 6 Click Score: 10    End of Session   Activity Tolerance: Patient limited by pain Patient left: in bed;with call bell/phone within reach;with bed alarm set Nurse Communication: Mobility status PT Visit Diagnosis: Unsteadiness on feet (R26.81);Difficulty in walking, not elsewhere classified (R26.2);Muscle weakness (generalized) (M62.81);Pain Pain - part of body:  (low back)     Time: 5102-5852 PT Time Calculation (min) (ACUTE ONLY): 24 min  Charges:  $Therapeutic Exercise: 8-22 mins $Therapeutic Activity: 8-22 mins                     D. Scott Lorean Ekstrand PT, DPT 08/11/21, 5:24 PM

## 2021-08-12 ENCOUNTER — Inpatient Hospital Stay: Payer: Medicare Other

## 2021-08-12 DIAGNOSIS — M5440 Lumbago with sciatica, unspecified side: Secondary | ICD-10-CM

## 2021-08-12 DIAGNOSIS — R7881 Bacteremia: Secondary | ICD-10-CM | POA: Diagnosis not present

## 2021-08-12 DIAGNOSIS — B952 Enterococcus as the cause of diseases classified elsewhere: Secondary | ICD-10-CM

## 2021-08-12 LAB — BLOOD CULTURE ID PANEL (REFLEXED) - BCID2

## 2021-08-12 LAB — CBC
HCT: 30.2 % — ABNORMAL LOW (ref 39.0–52.0)
Hemoglobin: 9.2 g/dL — ABNORMAL LOW (ref 13.0–17.0)
MCH: 29.3 pg (ref 26.0–34.0)
MCHC: 30.5 g/dL (ref 30.0–36.0)
MCV: 96.2 fL (ref 80.0–100.0)
Platelets: 143 10*3/uL — ABNORMAL LOW (ref 150–400)
RBC: 3.14 MIL/uL — ABNORMAL LOW (ref 4.22–5.81)
RDW: 16.6 % — ABNORMAL HIGH (ref 11.5–15.5)
WBC: 9.6 10*3/uL (ref 4.0–10.5)
nRBC: 0 % (ref 0.0–0.2)

## 2021-08-12 LAB — BASIC METABOLIC PANEL
Anion gap: 12 (ref 5–15)
BUN: 56 mg/dL — ABNORMAL HIGH (ref 6–20)
CO2: 28 mmol/L (ref 22–32)
Calcium: 9.6 mg/dL (ref 8.9–10.3)
Chloride: 98 mmol/L (ref 98–111)
Creatinine, Ser: 8.52 mg/dL — ABNORMAL HIGH (ref 0.61–1.24)
GFR, Estimated: 7 mL/min — ABNORMAL LOW (ref >60)
Glucose, Bld: 94 mg/dL (ref 70–99)
Potassium: 4.9 mmol/L (ref 3.5–5.1)
Sodium: 138 mmol/L (ref 135–145)

## 2021-08-12 LAB — PROCALCITONIN: Procalcitonin: 5.15 ng/mL

## 2021-08-12 LAB — MAGNESIUM: Magnesium: 2 mg/dL (ref 1.7–2.4)

## 2021-08-12 MED ORDER — SODIUM CHLORIDE 0.9 % IV SOLN
2.0000 g | Freq: Two times a day (BID) | INTRAVENOUS | Status: DC
  Administered 2021-08-12 – 2021-08-13 (×2): 2 g via INTRAVENOUS
  Filled 2021-08-12: qty 2
  Filled 2021-08-12: qty 20
  Filled 2021-08-12: qty 2
  Filled 2021-08-12 (×2): qty 20

## 2021-08-12 MED ORDER — SODIUM CHLORIDE 0.9 % IV SOLN
2.0000 g | Freq: Two times a day (BID) | INTRAVENOUS | Status: DC
  Administered 2021-08-12 – 2021-08-30 (×35): 2 g via INTRAVENOUS
  Filled 2021-08-12 (×2): qty 2000
  Filled 2021-08-12 (×2): qty 2
  Filled 2021-08-12 (×2): qty 2000
  Filled 2021-08-12 (×2): qty 2
  Filled 2021-08-12 (×2): qty 2000
  Filled 2021-08-12: qty 2
  Filled 2021-08-12 (×8): qty 2000
  Filled 2021-08-12 (×2): qty 2
  Filled 2021-08-12 (×9): qty 2000
  Filled 2021-08-12: qty 2
  Filled 2021-08-12: qty 2000
  Filled 2021-08-12: qty 2
  Filled 2021-08-12 (×7): qty 2000
  Filled 2021-08-12: qty 2

## 2021-08-12 MED ORDER — EPOETIN ALFA 4000 UNIT/ML IJ SOLN
4000.0000 [IU] | INTRAMUSCULAR | Status: DC
  Administered 2021-08-12 – 2021-08-23 (×3): 4000 [IU] via INTRAVENOUS
  Filled 2021-08-12 (×10): qty 1

## 2021-08-12 MED ORDER — MUSCLE RUB 10-15 % EX CREA
TOPICAL_CREAM | CUTANEOUS | Status: DC | PRN
  Filled 2021-08-12: qty 85

## 2021-08-12 NOTE — Progress Notes (Signed)
Patient ID: [name] [name], male   DOB: 06-18-1961, 60 y.o.   MRN: [id number]   Upon repositioning patient in bed, noted vessels of abdomen large, swollen, distended.  MD notified and at bedside.  Haydee Salter, RN

## 2021-08-12 NOTE — Plan of Care (Signed)
Patient hypotensive at the beginning of the shift. Dr. Clearence Ped notified. Saline bolus ordered, but patient's became normotensive prior to administration. Dr. Clearence Ped was notified of this and stated to hold the bolus unless patient's MAP was <60. Also stated to hold patient's Flexeril. PRN percocet administered x 1 for pain. K pad applied to patient's back. Patient went down to MRI, but was unable to complete the test due to pain.  Problem: Education: Goal: Knowledge of General Education information will improve Description: Including pain rating scale, medication(s)/side effects and non-pharmacologic comfort measures Outcome: Progressing   Problem: Health Behavior/Discharge Planning: Goal: Ability to manage health-related needs will improve Outcome: Progressing   Problem: Clinical Measurements: Goal: Ability to maintain clinical measurements within normal limits will improve Outcome: Progressing Goal: Will remain free from infection Outcome: Progressing Goal: Diagnostic test results will improve Outcome: Progressing Goal: Respiratory complications will improve Outcome: Progressing Goal: Cardiovascular complication will be avoided Outcome: Progressing   Problem: Activity: Goal: Risk for activity intolerance will decrease Outcome: Progressing   Problem: Nutrition: Goal: Adequate nutrition will be maintained Outcome: Progressing   Problem: Coping: Goal: Level of anxiety will decrease Outcome: Progressing   Problem: Elimination: Goal: Will not experience complications related to bowel motility Outcome: Progressing Goal: Will not experience complications related to urinary retention Outcome: Progressing   Problem: Pain Managment: Goal: General experience of comfort will improve Outcome: Progressing   Problem: Safety: Goal: Ability to remain free from injury will improve Outcome: Progressing   Problem: Skin Integrity: Goal: Risk for impaired skin integrity will  decrease Outcome: Progressing

## 2021-08-12 NOTE — Progress Notes (Addendum)
Pt refusing temperature check this AM. RN offered oral and axillary option. Pt stating he is making phone calls and will not allow RN to check temp at this time. RN educated pt about the importance of checking this vital sign, pt continues to refuse. RN will continue to attempt obtaining temperature.

## 2021-08-12 NOTE — Progress Notes (Signed)
PHARMACY - PHYSICIAN COMMUNICATION CRITICAL VALUE ALERT - BLOOD CULTURE IDENTIFICATION (BCID)  [name] [name] is an 60 y.o. male who presented to [facility] on 08/08/2021 with a chief complaint of  lower back pain, ESRD on HD MWF.  Assessment:  E Faecalis in 4 of 4 bottles , no resistance (include suspected source if known)  Name of physician (or Provider) Contacted: Zierle-Ghosh  Current antibiotics: Vanc, Ceftriaxone   Changes to prescribed antibiotics recommended:  Yes, [name]/c vancomycin and ceftriaxone and start  Ampicillin 2 mg IV Q12H ( renally adjusted for HD)   Results for orders placed or performed during the hospital encounter of 08/08/21  Blood Culture ID Panel (Reflexed) (Collected: 08/11/2021  5:47 PM)  Result Value Ref Range   Enterococcus faecalis DETECTED (A) NOT DETECTED   Enterococcus Faecium NOT DETECTED NOT DETECTED   Listeria monocytogenes NOT DETECTED NOT DETECTED   Staphylococcus species NOT DETECTED NOT DETECTED   Staphylococcus aureus (BCID) NOT DETECTED NOT DETECTED   Staphylococcus epidermidis NOT DETECTED NOT DETECTED   Staphylococcus lugdunensis NOT DETECTED NOT DETECTED   Streptococcus species NOT DETECTED NOT DETECTED   Streptococcus agalactiae NOT DETECTED NOT DETECTED   Streptococcus pneumoniae NOT DETECTED NOT DETECTED   Streptococcus pyogenes NOT DETECTED NOT DETECTED   A.calcoaceticus-baumannii NOT DETECTED NOT DETECTED   Bacteroides fragilis NOT DETECTED NOT DETECTED   Enterobacterales NOT DETECTED NOT DETECTED   Enterobacter cloacae complex NOT DETECTED NOT DETECTED   Escherichia coli NOT DETECTED NOT DETECTED   Klebsiella aerogenes NOT DETECTED NOT DETECTED   Klebsiella oxytoca NOT DETECTED NOT DETECTED   Klebsiella pneumoniae NOT DETECTED NOT DETECTED   Proteus species NOT DETECTED NOT DETECTED   Salmonella species NOT DETECTED NOT DETECTED   Serratia marcescens NOT DETECTED NOT DETECTED   Haemophilus influenzae NOT DETECTED NOT DETECTED    Neisseria meningitidis NOT DETECTED NOT DETECTED   Pseudomonas aeruginosa NOT DETECTED NOT DETECTED   Stenotrophomonas maltophilia NOT DETECTED NOT DETECTED   Candida albicans NOT DETECTED NOT DETECTED   Candida auris NOT DETECTED NOT DETECTED   Candida glabrata NOT DETECTED NOT DETECTED   Candida krusei NOT DETECTED NOT DETECTED   Candida parapsilosis NOT DETECTED NOT DETECTED   Candida tropicalis NOT DETECTED NOT DETECTED   Cryptococcus neoformans/gattii NOT DETECTED NOT DETECTED   Vancomycin resistance NOT DETECTED NOT DETECTED    [name] [name] 08/12/2021  5:22 AM

## 2021-08-12 NOTE — Progress Notes (Signed)
[facility]  ROUNDING NOTE   Subjective:   [name] [name] is a 60 y.o. male with medical conditions including anemia, CHF, hypertension, and ESRD on dialysis. Patient has presented to ED with progressive back pain.   Patient states he has always had back pain but it has increased in the past few days. Denies known precipitating factors. Chest X-ray of lumbar spine shows multilevel degenerative changes and stenosis, greatest at L4-L5.   Patient is known to our practice and receives dialysis at [facility] supervised by Dr Juleen China. We have been consulted to manage dialysis treatments.   Patient seen laying in bed Alert, confused, unsure of why he is here, states police brought him in Tolerating small meals, refused breakfast this morning Denies shortness of breath, on 2L [place] Denies pain at this time, wincing with movement  Patient seen during dialysis   HEMODIALYSIS FLOWSHEET:  Blood Flow Rate (mL/min): 400 mL/min Arterial Pressure (mmHg): -190 mmHg Venous Pressure (mmHg): 200 mmHg Transmembrane Pressure (mmHg): 70 mmHg Ultrafiltration Rate (mL/min): 550 mL/min Dialysate Flow Rate (mL/min): 500 ml/min Conductivity: Machine : 13.7 Conductivity: Machine : 13.7 Dialysis Fluid Bolus: Normal Saline Bolus Amount (mL): 200 mL  Scheduled dialysis today d/t MRI with contrast yesterday   Objective:  Vital signs in last 24 hours:  Temp:  [97.3 F (36.3 C)-101.5 F (38.6 C)] 98.1 F (36.7 C) (09/03 0912) Pulse Rate:  [76-108] 102 (09/03 0810) Resp:  [0-20] 16 (09/03 0810) BP: (86-113)/(42-64) 108/57 (09/03 0810) SpO2:  [94 %-100 %] 97 % (09/03 0810)  Weight change:  Filed Weights   08/08/21 0625  Weight: 91 kg    Intake/Output: I/O last 3 completed shifts: In: 500 [IV Piggyback:500] Out: 0    Intake/Output this shift:  No intake/output data recorded.  Physical Exam: General: NAD, resting in bed  Head: Normocephalic, atraumatic. Moist oral mucosal  membranes  Eyes: Anicteric  Lungs:  Clear to auscultation, normal effort, O2 2L  Heart: Regular rate and rhythm  Abdomen:  Soft, nontender  Extremities:  trace peripheral edema.  Neurologic: Alert, moving all four extremities  Skin: No lesions  Access: Rt AVG    Basic Metabolic Panel: Recent Labs  Lab 08/08/21 0816 08/09/21 0451 08/10/21 0347 08/11/21 0226 08/12/21 0613  NA 139 137 137 136 138  K 4.6 5.1 4.4 5.0 4.9  CL 101 101 99 99 98  CO2 28 26 29 28 28   GLUCOSE 93 107* 83 89 94  BUN 49* 67* 47* 65* 56*  CREATININE 8.43* 10.22* 7.87* 9.61* 8.52*  CALCIUM 9.7 9.5 9.3 9.9 9.6  MG  --   --  2.0 2.2 2.0  PHOS  --   --   --  7.6*  --      Liver Function Tests: Recent Labs  Lab 08/08/21 0816  AST 16  ALT 13  ALKPHOS 79  BILITOT 1.1  PROT 6.9  ALBUMIN 2.6*    No results for input(s): LIPASE, AMYLASE in the last 168 hours. No results for input(s): AMMONIA in the last 168 hours.  CBC: Recent Labs  Lab 08/08/21 0816 08/10/21 0347 08/11/21 0226 08/12/21 0613  WBC 9.3 8.8 8.5 9.6  NEUTROABS 7.6  --   --   --   HGB 9.6* 10.0* 9.9* 9.2*  HCT 30.3* 31.9* 31.1* 30.2*  MCV 95.3 93.3 96.0 96.2  PLT 136* 150 145* 143*     Cardiac Enzymes: No results for input(s): CKTOTAL, CKMB, CKMBINDEX, TROPONINI in the last 168  hours.  BNP: Invalid input(s): POCBNP  CBG: No results for input(s): GLUCAP in the last 168 hours.  Microbiology: Results for orders placed or performed during the hospital encounter of 08/08/21  Resp Panel by RT-PCR (Flu A&B, Covid) Nasopharyngeal Swab     Status: None   Collection Time: 08/08/21  1:22 PM   Specimen: Nasopharyngeal Swab; Nasopharyngeal(NP) swabs in vial transport medium  Result Value Ref Range Status   SARS Coronavirus 2 by RT PCR NEGATIVE NEGATIVE Final    Comment: (NOTE) SARS-CoV-2 target nucleic acids are NOT DETECTED.  The SARS-CoV-2 RNA is generally detectable in upper respiratory specimens during the acute phase of  infection. The lowest concentration of SARS-CoV-2 viral copies this assay can detect is 138 copies/mL. A negative result does not preclude SARS-Cov-2 infection and should not be used as the sole basis for treatment or other patient management decisions. A negative result may occur with  improper specimen collection/handling, submission of specimen other than nasopharyngeal swab, presence of viral mutation(s) within the areas targeted by this assay, and inadequate number of viral copies(<138 copies/mL). A negative result must be combined with clinical observations, patient history, and epidemiological information. The expected result is Negative.  Fact Sheet for Patients:  EntrepreneurPulse.com.au  Fact Sheet for Healthcare Providers:  IncredibleEmployment.be  This test is no t yet approved or cleared by the [place] FDA and  has been authorized for detection and/or diagnosis of SARS-CoV-2 by FDA under an Emergency Use Authorization (EUA). This EUA will remain  in effect (meaning this test can be used) for the duration of the COVID-19 declaration under Section 564(b)(1) of the Act, 21 U.S.C.section 360bbb-3(b)(1), unless the authorization is terminated  or revoked sooner.       Influenza A by PCR NEGATIVE NEGATIVE Final   Influenza B by PCR NEGATIVE NEGATIVE Final    Comment: (NOTE) The Xpert Xpress SARS-CoV-2/FLU/RSV plus assay is intended as an aid in the diagnosis of influenza from Nasopharyngeal swab specimens and should not be used as a sole basis for treatment. Nasal washings and aspirates are unacceptable for Xpert Xpress SARS-CoV-2/FLU/RSV testing.  Fact Sheet for Patients: EntrepreneurPulse.com.au  Fact Sheet for Healthcare Providers: IncredibleEmployment.be  This test is not yet approved or cleared by the [place] FDA and has been authorized for detection and/or diagnosis of SARS-CoV-2  by FDA under an Emergency Use Authorization (EUA). This EUA will remain in effect (meaning this test can be used) for the duration of the COVID-19 declaration under Section 564(b)(1) of the Act, 21 U.S.C. section 360bbb-3(b)(1), unless the authorization is terminated or revoked.  Performed at [facility], [address] [place], [place] [address]   Culture, blood (Routine X 2) w Reflex to ID Panel     Status: None (Preliminary result)   Collection Time: 08/11/21  5:47 PM   Specimen: BLOOD  Result Value Ref Range Status   Specimen Description BLOOD LEFT HAND  Final   Special Requests   Final    BOTTLES DRAWN AEROBIC AND ANAEROBIC Blood Culture adequate volume   Culture  Setup Time   Final    GRAM POSITIVE COCCI IN CHAINS IN BOTH AEROBIC AND ANAEROBIC BOTTLES Gram Stain Report Called to,Read Back By and Verified With: JASON ROBBINS 08/12/21 @ 0507 BY SB Performed at [facility], [address] [place], [place] [address]    Culture GRAM POSITIVE COCCI IN CHAINS  Final   Report Status PENDING  Incomplete  Culture, blood (Routine X 2) w  Reflex to ID Panel     Status: None (Preliminary result)   Collection Time: 08/11/21  5:47 PM   Specimen: BLOOD  Result Value Ref Range Status   Specimen Description   Final    BLOOD FOA Performed at [facility], [address], [place], [place] [address]    Special Requests   Final    BOTTLES DRAWN AEROBIC AND ANAEROBIC Blood Culture adequate volume Performed at [facility], [address] [place], [place] [address]    Culture  Setup Time   Final    GRAM POSITIVE COCCI IN CHAINS IN BOTH AEROBIC AND ANAEROBIC BOTTLES Gram Stain Report Called to,Read Back By and Verified With: JASON ROBBINS 08/12/21 @ 0507 BY SB Performed at [facility], [place] [address], [place], [place] [address]    Culture GRAM POSITIVE COCCI  Final   Report Status PENDING  Incomplete  Blood Culture ID Panel (Reflexed)     Status:  Abnormal   Collection Time: 08/11/21  5:47 PM  Result Value Ref Range Status   Enterococcus faecalis DETECTED (A) NOT DETECTED Final    Comment: CRITICAL RESULT CALLED TO, READ BACK BY AND VERIFIED WITH: JASON ROBBINS 08/12/21 @ 0507 BY SB    Enterococcus Faecium NOT DETECTED NOT DETECTED Final   Listeria monocytogenes NOT DETECTED NOT DETECTED Final   Staphylococcus species NOT DETECTED NOT DETECTED Final   Staphylococcus aureus (BCID) NOT DETECTED NOT DETECTED Final   Staphylococcus epidermidis NOT DETECTED NOT DETECTED Final   Staphylococcus lugdunensis NOT DETECTED NOT DETECTED Final   Streptococcus species NOT DETECTED NOT DETECTED Final   Streptococcus agalactiae NOT DETECTED NOT DETECTED Final   Streptococcus pneumoniae NOT DETECTED NOT DETECTED Final   Streptococcus pyogenes NOT DETECTED NOT DETECTED Final   A.calcoaceticus-baumannii NOT DETECTED NOT DETECTED Final   Bacteroides fragilis NOT DETECTED NOT DETECTED Final   Enterobacterales NOT DETECTED NOT DETECTED Final   Enterobacter cloacae complex NOT DETECTED NOT DETECTED Final   Escherichia coli NOT DETECTED NOT DETECTED Final   Klebsiella aerogenes NOT DETECTED NOT DETECTED Final   Klebsiella oxytoca NOT DETECTED NOT DETECTED Final   Klebsiella pneumoniae NOT DETECTED NOT DETECTED Final   Proteus species NOT DETECTED NOT DETECTED Final   Salmonella species NOT DETECTED NOT DETECTED Final   Serratia marcescens NOT DETECTED NOT DETECTED Final   Haemophilus influenzae NOT DETECTED NOT DETECTED Final   Neisseria meningitidis NOT DETECTED NOT DETECTED Final   Pseudomonas aeruginosa NOT DETECTED NOT DETECTED Final   Stenotrophomonas maltophilia NOT DETECTED NOT DETECTED Final   Candida albicans NOT DETECTED NOT DETECTED Final   Candida auris NOT DETECTED NOT DETECTED Final   Candida glabrata NOT DETECTED NOT DETECTED Final   Candida krusei NOT DETECTED NOT DETECTED Final   Candida parapsilosis NOT DETECTED NOT DETECTED Final    Candida tropicalis NOT DETECTED NOT DETECTED Final   Cryptococcus neoformans/gattii NOT DETECTED NOT DETECTED Final   Vancomycin resistance NOT DETECTED NOT DETECTED Final    Comment: Performed at [facility], [address] [place], [place] [address]    Coagulation Studies: No results for input(s): LABPROT, INR in the last 72 hours.  Urinalysis: No results for input(s): COLORURINE, LABSPEC, PHURINE, GLUCOSEU, HGBUR, BILIRUBINUR, KETONESUR, PROTEINUR, UROBILINOGEN, NITRITE, LEUKOCYTESUR in the last 72 hours.  Invalid input(s): APPERANCEUR    Imaging: DG Chest 1 View  Result Date: 08/11/2021 CLINICAL DATA:  Kidney damage. End-stage renal disease on hemodialysis. EXAM: CHEST  1 VIEW COMPARISON:  04/16/2015  and 08/18/2013 radiographs.  CT 03/06/2013. FINDINGS: 1352 hours. Two views obtained. Mild patient rotation to the right. Right IJ hemodialysis catheters project to the level of the inferior right atrium. There is progressive cardiac enlargement. There is chronic volume loss and pleural thickening in the right hemithorax which appears similar to the prior studies allowing for the rotation. The left lung is clear. There is no pneumothorax or significant pleural effusion. The bones appear unchanged. IMPRESSION: No evidence of acute cardiopulmonary process. Stable chronic volume loss and pleuroparenchymal scarring in the right hemithorax. Electronically Signed   By: Richardean Sale M.D.   On: 08/11/2021 16:01   MR CERVICAL SPINE WO CONTRAST  Result Date: 08/12/2021 CLINICAL DATA:  Neck pain, acute.  Infection suspected.  Sepsis. EXAM: MRI CERVICAL AND THORACIC SPINE WITHOUT CONTRAST TECHNIQUE: Multiplanar and multiecho pulse sequences of the cervical spine, to include the craniocervical junction and cervicothoracic junction, and the thoracic spine, were obtained without intravenous contrast. COMPARISON:  Lumbar spine CT 08/08/2021. FINDINGS: MRI CERVICAL SPINE FINDINGS Alignment:  Unremarkable Vertebrae: Fatty marrow conversion at C5-6. No visible marrow inflammation, acute fracture, or aggressive bone lesion. Cord: No visible swelling or edema. No evidence of spinal collection. Posterior Fossa, vertebral arteries, paraspinal tissues: Mild prevertebral/retropharyngeal edema without visible source. Disc levels: Focal disc degeneration at C6-7 with chronic inferior endplate fracture or Schmorl's node at C5. Mild or moderate left foraminal narrowing at this level from disc bulging and uncovertebral ridging. MRI THORACIC SPINE FINDINGS Alignment:  Unremarkable Vertebrae: No visible marrow edema, fracture, or discitis. L2 inferior endplate Schmorl's node fatty marrow conversion. Diffusely low marrow signal. Cord: No visible intrathecal collection. There are pleural effusions bilaterally. Paraspinal and other soft tissues: Prominent paravertebral T2 hyperintensity which is presumably fat that extends into the bilateral subpleural space. Disc levels: No visible herniation or impingement. IMPRESSION: 1. Mild prevertebral/retropharyngeal edema without visible underlying osteomyelitis or septic arthritis. If persistent/progressive symptoms please have low threshold for follow-up given the suboptimal study quality. 2. Generally abnormal marrow signal needing correlation with CBC. Electronically Signed   By: Monte Fantasia M.D.   On: 08/12/2021 09:00   MR THORACIC SPINE WO CONTRAST  Result Date: 08/12/2021 CLINICAL DATA:  Neck pain, acute.  Infection suspected.  Sepsis. EXAM: MRI CERVICAL AND THORACIC SPINE WITHOUT CONTRAST TECHNIQUE: Multiplanar and multiecho pulse sequences of the cervical spine, to include the craniocervical junction and cervicothoracic junction, and the thoracic spine, were obtained without intravenous contrast. COMPARISON:  Lumbar spine CT 08/08/2021. FINDINGS: MRI CERVICAL SPINE FINDINGS Alignment: Unremarkable Vertebrae: Fatty marrow conversion at C5-6. No visible marrow  inflammation, acute fracture, or aggressive bone lesion. Cord: No visible swelling or edema. No evidence of spinal collection. Posterior Fossa, vertebral arteries, paraspinal tissues: Mild prevertebral/retropharyngeal edema without visible source. Disc levels: Focal disc degeneration at C6-7 with chronic inferior endplate fracture or Schmorl's node at C5. Mild or moderate left foraminal narrowing at this level from disc bulging and uncovertebral ridging. MRI THORACIC SPINE FINDINGS Alignment:  Unremarkable Vertebrae: No visible marrow edema, fracture, or discitis. L2 inferior endplate Schmorl's node fatty marrow conversion. Diffusely low marrow signal. Cord: No visible intrathecal collection. There are pleural effusions bilaterally. Paraspinal and other soft tissues: Prominent paravertebral T2 hyperintensity which is presumably fat that extends into the bilateral subpleural space. Disc levels: No visible herniation or impingement. IMPRESSION: 1. Mild prevertebral/retropharyngeal edema without visible underlying osteomyelitis or septic arthritis. If persistent/progressive symptoms please have low threshold for follow-up given the suboptimal study quality. 2. Generally abnormal marrow signal  needing correlation with CBC. Electronically Signed   By: Monte Fantasia M.D.   On: 08/12/2021 09:00     Medications:    ampicillin (OMNIPEN) IV 2 g (08/12/21 8453)   sodium chloride     sodium chloride       apixaban  2.5 mg Oral BID   calcium acetate  2,668 mg Oral TID WC   Chlorhexidine Gluconate Cloth  6 each Topical Q0600   cyclobenzaprine  10 mg Oral QHS   gabapentin  100 mg Oral TID   lidocaine  2 patch Transdermal Q24H   nicotine  21 mg Transdermal Daily   acetaminophen, hydrALAZINE, Muscle Rub, ondansetron (ZOFRAN) IV, oxyCODONE-acetaminophen  Assessment/ Plan:  Mr. TAVIS KRING is a 60 y.o.  male with medical conditions including anemia, CHF, hypertension, and ESRD on dialysis. Patient has  presented to ED with progressive back pain.  [place] [facility]/MWF/Rt AVG  1. End stage renal disease on dialysis Will maintain outpatient schedule if possible Dialysis received yesterday, UF 1.5L achieved Spoke with primary team about proposed MRI with contrast, this was completed yesterday. Will schedule extra treatment today. Next treatment scheduled for Monday Rehab at discharge, bed accepted in [place]. Dialysis coordinator notified and will make outpatient arrangements accordingly.   2. Anemia of chronic kidney disease Lab Results  Component Value Date   HGB 9.2 (L) 08/12/2021  Hgb below target, will order low dose EPO with treatment  3. Secondary Hyperparathyroidism:  Lab Results  Component Value Date   PTH 583.2 (H) 12/11/2012   CALCIUM 9.6 08/12/2021   CAION 1.27 (H) 12/26/2012   PHOS 7.6 (H) 08/11/2021  Calcium at goal, phosphorus elevated Calcium acetate with meals    4. 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 MRI of cervical, and thoracic spine without contrast to evaluate possible spinal abscess.infection.  Cervical and thoracic MRI shows mild prevertebral/retropharyngeal edema without visible osteomyelitis or septic arthritis.  5. LE edema Improving with dialysis   LOS: 3   9/3/202210:18 AM

## 2021-08-12 NOTE — Progress Notes (Signed)
PROGRESS NOTE    [name] [name]  CVE:[id number] DOB: 02-19-1961 DOA: 08/08/2021 PCP: Pcp, No  142A/142A-AA   Assessment & Plan:   Principal Problem:   Lower back pain Active Problems:   End-stage renal disease on hemodialysis ([place])   Polysubstance abuse ([place])   Tobacco abuse   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 (HCC)   Anemia in ESRD (end-stage renal disease) ([place])   HTN (hypertension)   Back pain   [name] [name] is a 60 y.o. male with medical history significant of ESRD-HD (MWF), hypertension, tobacco abuse, polysubstance abuse, diverticulitis, anemia, jugular vein thrombosis, GI bleeding, right pneumothorax, who presents with lower back pain.   Patient states that he has been having lower back pain for more than 1 week, which has been progressively worsening.  The pain is constant, sharp, 10 out of 10 in severity today, nonradiating.  The pain is so severe that he cannot walk.  On arrival he had to be lifted out of his vehicle by security and nursing staff.     Sepsis 2/2  Enterococcus faecalis bacteremia --developed fever to 101.5 on 9/2, with tachycardia --in the setting of severe back pain, I am concerned about spinal or paraspinal infection.  MRI w contrast attempted, however, pt asked to get off early and did not complete the scan. --started on empiric vanc and ceftriaxone Plan: --transition to IV ampicillin after blood cx ID pos for Enterococcus --ID consult today --TTE for endocarditis rule out --re-attempt MRI w wo spine on Sunday (confirmed with radiologist that [place] contrast is ok to use in ESRD pt)  Severe Lower back pain:  CT scan showed multilevel degenerative and canal and foraminal stenosis, greatest at L4-L5.  Pt did not have alarming symptoms.   --back pain out of proportion to imaging finding  Plan: --cont Percocet 5 mg q6h PRN --continue gabapentin 100 mg TID --Flexeril --lidocaine patches --MRI spine wwo to assess for spinal or  paraspinal infection  End-stage renal disease on hemodialysis (MWF) --cont phoslo --iHD per nephro   Polysubstance abuse and tobacco abuse -Did counseling about importance of quitting substance use and tobacco use --cont nicotine patch -Check UDS   Anemia in ESRD (end-stage renal disease) ([place]):  --Epo with dialysis  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  --Eliquis changed from 5 mg daily to 2.5 mg bid per Dr. Keturah Barre recommendation. --cont Eliquis   HTN, not currently active Hypotension --BP has been intermittently low in 90's --hold amlodipine --small bolus if systolic <02   DVT prophylaxis: HE:NIDPOEU Code Status: Full code  Family Communication:  Level of care: Med-Surg Dispo:   The patient is from: home Anticipated d/c is to: SNF Anticipated d/c date is: undetermined  Patient currently is NOT ready to d/c.  Enterococcus bacteremia on IV abx   Subjective and Interval History:  Pt asked to get off early from his MRI scan, so scan didn't reach lumbar nor did pt receive contrast needed.    Blood cx pos for Enterococcus.  Pt reported back pain improved some today.   Objective: Vitals:   08/12/21 1415 08/12/21 1419 08/12/21 1430 08/12/21 1612  BP: (!) 107/52   (!) 121/56  Pulse:  97 94 (!) 102  Resp: 20 18 18 17   Temp:  99.7 F (37.6 C)  98.1 F (36.7 C)  TempSrc:  Oral  Oral  SpO2:  98% (!) 46% 94%  Weight:      Height:  Intake/Output Summary (Last 24 hours) at 08/12/2021 1726 Last data filed at 08/12/2021 1400 Gross per 24 hour  Intake 600 ml  Output 633 ml  Net -33 ml    Filed Weights   08/08/21 0625  Weight: 91 kg    Examination:   Constitutional: NAD, lethargic but awake enough to answer questions today. HEENT: conjunctivae and lids normal, EOMI CV: No cyanosis.   RESP: normal respiratory effort, on RA Extremities: edema in BUE SKIN: warm, dry   Data Reviewed: I have personally reviewed following labs and  imaging studies  CBC: Recent Labs  Lab 08/08/21 0816 08/10/21 0347 08/11/21 0226 08/12/21 0613  WBC 9.3 8.8 8.5 9.6  NEUTROABS 7.6  --   --   --   HGB 9.6* 10.0* 9.9* 9.2*  HCT 30.3* 31.9* 31.1* 30.2*  MCV 95.3 93.3 96.0 96.2  PLT 136* 150 145* 409*   Basic Metabolic Panel: Recent Labs  Lab 08/08/21 0816 08/09/21 0451 08/10/21 0347 08/11/21 0226 08/12/21 0613  NA 139 137 137 136 138  K 4.6 5.1 4.4 5.0 4.9  CL 101 101 99 99 98  CO2 28 26 29 28 28   GLUCOSE 93 107* 83 89 94  BUN 49* 67* 47* 65* 56*  CREATININE 8.43* 10.22* 7.87* 9.61* 8.52*  CALCIUM 9.7 9.5 9.3 9.9 9.6  MG  --   --  2.0 2.2 2.0  PHOS  --   --   --  7.6*  --    GFR: Estimated Creatinine Clearance: 10.8 mL/min (A) (by C-G formula based on SCr of 8.52 mg/dL (H)). Liver Function Tests: Recent Labs  Lab 08/08/21 0816  AST 16  ALT 13  ALKPHOS 79  BILITOT 1.1  PROT 6.9  ALBUMIN 2.6*   No results for input(s): LIPASE, AMYLASE in the last 168 hours. No results for input(s): AMMONIA in the last 168 hours. Coagulation Profile: No results for input(s): INR, PROTIME in the last 168 hours. Cardiac Enzymes: No results for input(s): CKTOTAL, CKMB, CKMBINDEX, TROPONINI in the last 168 hours. BNP (last 3 results) No results for input(s): PROBNP in the last 8760 hours. HbA1C: No results for input(s): HGBA1C in the last 72 hours. CBG: No results for input(s): GLUCAP in the last 168 hours. Lipid Profile: No results for input(s): CHOL, HDL, LDLCALC, TRIG, CHOLHDL, LDLDIRECT in the last 72 hours. Thyroid Function Tests: No results for input(s): TSH, T4TOTAL, FREET4, T3FREE, THYROIDAB in the last 72 hours. Anemia Panel: No results for input(s): VITAMINB12, FOLATE, FERRITIN, TIBC, IRON, RETICCTPCT in the last 72 hours. Sepsis Labs: Recent Labs  Lab 08/11/21 0226 08/11/21 1747 08/11/21 2002 08/12/21 0613  PROCALCITON 5.82  --   --  5.15  LATICACIDVEN  --  0.9 1.1  --     Recent Results (from the past  240 hour(s))  Resp Panel by RT-PCR (Flu A&B, Covid) Nasopharyngeal Swab     Status: None   Collection Time: 08/08/21  1:22 PM   Specimen: Nasopharyngeal Swab; Nasopharyngeal(NP) swabs in vial transport medium  Result Value Ref Range Status   SARS Coronavirus 2 by RT PCR NEGATIVE NEGATIVE Final    Comment: (NOTE) SARS-CoV-2 target nucleic acids are NOT DETECTED.  The SARS-CoV-2 RNA is generally detectable in upper respiratory specimens during the acute phase of infection. The lowest concentration of SARS-CoV-2 viral copies this assay can detect is 138 copies/mL. A negative result does not preclude SARS-Cov-2 infection and should not be used as the sole basis for treatment or other patient  management decisions. A negative result may occur with  improper specimen collection/handling, submission of specimen other than nasopharyngeal swab, presence of viral mutation(s) within the areas targeted by this assay, and inadequate number of viral copies(<138 copies/mL). A negative result must be combined with clinical observations, patient history, and epidemiological information. The expected result is Negative.  Fact Sheet for Patients:  EntrepreneurPulse.com.au  Fact Sheet for Healthcare Providers:  IncredibleEmployment.be  This test is no t yet approved or cleared by the [place] FDA and  has been authorized for detection and/or diagnosis of SARS-CoV-2 by FDA under an Emergency Use Authorization (EUA). This EUA will remain  in effect (meaning this test can be used) for the duration of the COVID-19 declaration under Section 564(b)(1) of the Act, 21 U.S.C.section 360bbb-3(b)(1), unless the authorization is terminated  or revoked sooner.       Influenza A by PCR NEGATIVE NEGATIVE Final   Influenza B by PCR NEGATIVE NEGATIVE Final    Comment: (NOTE) The Xpert Xpress SARS-CoV-2/FLU/RSV plus assay is intended as an aid in the diagnosis of influenza  from Nasopharyngeal swab specimens and should not be used as a sole basis for treatment. Nasal washings and aspirates are unacceptable for Xpert Xpress SARS-CoV-2/FLU/RSV testing.  Fact Sheet for Patients: EntrepreneurPulse.com.au  Fact Sheet for Healthcare Providers: IncredibleEmployment.be  This test is not yet approved or cleared by the [place] FDA and has been authorized for detection and/or diagnosis of SARS-CoV-2 by FDA under an Emergency Use Authorization (EUA). This EUA will remain in effect (meaning this test can be used) for the duration of the COVID-19 declaration under Section 564(b)(1) of the Act, 21 U.S.C. section 360bbb-3(b)(1), unless the authorization is terminated or revoked.  Performed at [facility], [address] [place], [place] [address]   Culture, blood (Routine X 2) w Reflex to ID Panel     Status: None (Preliminary result)   Collection Time: 08/11/21  5:47 PM   Specimen: BLOOD  Result Value Ref Range Status   Specimen Description BLOOD LEFT HAND  Final   Special Requests   Final    BOTTLES DRAWN AEROBIC AND ANAEROBIC Blood Culture adequate volume   Culture  Setup Time   Final    GRAM POSITIVE COCCI IN CHAINS IN BOTH AEROBIC AND ANAEROBIC BOTTLES Gram Stain Report Called to,Read Back By and Verified With: JASON ROBBINS 08/12/21 @ 0507 BY SB Performed at [facility], [address] [place], [place] [address]    Culture GRAM POSITIVE COCCI IN CHAINS  Final   Report Status PENDING  Incomplete  Culture, blood (Routine X 2) w Reflex to ID Panel     Status: None (Preliminary result)   Collection Time: 08/11/21  5:47 PM   Specimen: BLOOD  Result Value Ref Range Status   Specimen Description   Final    BLOOD FOA Performed at [facility], [address] [place], [place] [address]    Special Requests   Final    BOTTLES DRAWN AEROBIC AND ANAEROBIC Blood Culture adequate  volume Performed at [facility], [address] [place], [place] [address]    Culture  Setup Time   Final    GRAM POSITIVE COCCI IN CHAINS IN BOTH AEROBIC AND ANAEROBIC BOTTLES Gram Stain Report Called to,Read Back By and Verified With: JASON ROBBINS 08/12/21 @ 0507 BY SB Performed at [facility], [place] [address], [place], [place] [address]    Culture [facility] POSITIVE COCCI  Final  Report Status PENDING  Incomplete  Blood Culture ID Panel (Reflexed)     Status: Abnormal   Collection Time: 08/11/21  5:47 PM  Result Value Ref Range Status   Enterococcus faecalis DETECTED (A) NOT DETECTED Final    Comment: CRITICAL RESULT CALLED TO, READ BACK BY AND VERIFIED WITH: JASON ROBBINS 08/12/21 @ 0507 BY SB    Enterococcus Faecium NOT DETECTED NOT DETECTED Final   Listeria monocytogenes NOT DETECTED NOT DETECTED Final   Staphylococcus species NOT DETECTED NOT DETECTED Final   Staphylococcus aureus (BCID) NOT DETECTED NOT DETECTED Final   Staphylococcus epidermidis NOT DETECTED NOT DETECTED Final   Staphylococcus lugdunensis NOT DETECTED NOT DETECTED Final   Streptococcus species NOT DETECTED NOT DETECTED Final   Streptococcus agalactiae NOT DETECTED NOT DETECTED Final   Streptococcus pneumoniae NOT DETECTED NOT DETECTED Final   Streptococcus pyogenes NOT DETECTED NOT DETECTED Final   A.calcoaceticus-baumannii NOT DETECTED NOT DETECTED Final   Bacteroides fragilis NOT DETECTED NOT DETECTED Final   Enterobacterales NOT DETECTED NOT DETECTED Final   Enterobacter cloacae complex NOT DETECTED NOT DETECTED Final   Escherichia coli NOT DETECTED NOT DETECTED Final   Klebsiella aerogenes NOT DETECTED NOT DETECTED Final   Klebsiella oxytoca NOT DETECTED NOT DETECTED Final   Klebsiella pneumoniae NOT DETECTED NOT DETECTED Final   Proteus species NOT DETECTED NOT DETECTED Final   Salmonella species NOT DETECTED NOT DETECTED Final   Serratia marcescens NOT DETECTED NOT DETECTED Final    Haemophilus influenzae NOT DETECTED NOT DETECTED Final   Neisseria meningitidis NOT DETECTED NOT DETECTED Final   Pseudomonas aeruginosa NOT DETECTED NOT DETECTED Final   Stenotrophomonas maltophilia NOT DETECTED NOT DETECTED Final   Candida albicans NOT DETECTED NOT DETECTED Final   Candida auris NOT DETECTED NOT DETECTED Final   Candida glabrata NOT DETECTED NOT DETECTED Final   Candida krusei NOT DETECTED NOT DETECTED Final   Candida parapsilosis NOT DETECTED NOT DETECTED Final   Candida tropicalis NOT DETECTED NOT DETECTED Final   Cryptococcus neoformans/gattii NOT DETECTED NOT DETECTED Final   Vancomycin resistance NOT DETECTED NOT DETECTED Final    Comment: Performed at [facility], [address], [place], [place] [address]      Radiology Studies: DG Chest 1 View  Result Date: 08/11/2021 CLINICAL DATA:  Kidney damage. End-stage renal disease on hemodialysis. EXAM: CHEST  1 VIEW COMPARISON:  04/16/2015 and 08/18/2013 radiographs.  CT 03/06/2013. FINDINGS: 1352 hours. Two views obtained. Mild patient rotation to the right. Right IJ hemodialysis catheters project to the level of the inferior right atrium. There is progressive cardiac enlargement. There is chronic volume loss and pleural thickening in the right hemithorax which appears similar to the prior studies allowing for the rotation. The left lung is clear. There is no pneumothorax or significant pleural effusion. The bones appear unchanged. IMPRESSION: No evidence of acute cardiopulmonary process. Stable chronic volume loss and pleuroparenchymal scarring in the right hemithorax. Electronically Signed   By: Richardean Sale M.D.   On: 08/11/2021 16:01   MR CERVICAL SPINE WO CONTRAST  Result Date: 08/12/2021 CLINICAL DATA:  Neck pain, acute.  Infection suspected.  Sepsis. EXAM: MRI CERVICAL AND THORACIC SPINE WITHOUT CONTRAST TECHNIQUE: Multiplanar and multiecho pulse sequences of the cervical spine, to include the  craniocervical junction and cervicothoracic junction, and the thoracic spine, were obtained without intravenous contrast. COMPARISON:  Lumbar spine CT 08/08/2021. FINDINGS: MRI CERVICAL SPINE FINDINGS Alignment: Unremarkable Vertebrae: Fatty marrow conversion at C5-6. No visible marrow inflammation, acute fracture,  or aggressive bone lesion. Cord: No visible swelling or edema. No evidence of spinal collection. Posterior Fossa, vertebral arteries, paraspinal tissues: Mild prevertebral/retropharyngeal edema without visible source. Disc levels: Focal disc degeneration at C6-7 with chronic inferior endplate fracture or Schmorl's node at C5. Mild or moderate left foraminal narrowing at this level from disc bulging and uncovertebral ridging. MRI THORACIC SPINE FINDINGS Alignment:  Unremarkable Vertebrae: No visible marrow edema, fracture, or discitis. L2 inferior endplate Schmorl's node fatty marrow conversion. Diffusely low marrow signal. Cord: No visible intrathecal collection. There are pleural effusions bilaterally. Paraspinal and other soft tissues: Prominent paravertebral T2 hyperintensity which is presumably fat that extends into the bilateral subpleural space. Disc levels: No visible herniation or impingement. IMPRESSION: 1. Mild prevertebral/retropharyngeal edema without visible underlying osteomyelitis or septic arthritis. If persistent/progressive symptoms please have low threshold for follow-up given the suboptimal study quality. 2. Generally abnormal marrow signal needing correlation with CBC. Electronically Signed   By: Monte Fantasia M.D.   On: 08/12/2021 09:00   MR THORACIC SPINE WO CONTRAST  Result Date: 08/12/2021 CLINICAL DATA:  Neck pain, acute.  Infection suspected.  Sepsis. EXAM: MRI CERVICAL AND THORACIC SPINE WITHOUT CONTRAST TECHNIQUE: Multiplanar and multiecho pulse sequences of the cervical spine, to include the craniocervical junction and cervicothoracic junction, and the thoracic spine,  were obtained without intravenous contrast. COMPARISON:  Lumbar spine CT 08/08/2021. FINDINGS: MRI CERVICAL SPINE FINDINGS Alignment: Unremarkable Vertebrae: Fatty marrow conversion at C5-6. No visible marrow inflammation, acute fracture, or aggressive bone lesion. Cord: No visible swelling or edema. No evidence of spinal collection. Posterior Fossa, vertebral arteries, paraspinal tissues: Mild prevertebral/retropharyngeal edema without visible source. Disc levels: Focal disc degeneration at C6-7 with chronic inferior endplate fracture or Schmorl's node at C5. Mild or moderate left foraminal narrowing at this level from disc bulging and uncovertebral ridging. MRI THORACIC SPINE FINDINGS Alignment:  Unremarkable Vertebrae: No visible marrow edema, fracture, or discitis. L2 inferior endplate Schmorl's node fatty marrow conversion. Diffusely low marrow signal. Cord: No visible intrathecal collection. There are pleural effusions bilaterally. Paraspinal and other soft tissues: Prominent paravertebral T2 hyperintensity which is presumably fat that extends into the bilateral subpleural space. Disc levels: No visible herniation or impingement. IMPRESSION: 1. Mild prevertebral/retropharyngeal edema without visible underlying osteomyelitis or septic arthritis. If persistent/progressive symptoms please have low threshold for follow-up given the suboptimal study quality. 2. Generally abnormal marrow signal needing correlation with CBC. Electronically Signed   By: Monte Fantasia M.D.   On: 08/12/2021 09:00     Scheduled Meds:  apixaban  2.5 mg Oral BID   calcium acetate  2,668 mg Oral TID WC   Chlorhexidine Gluconate Cloth  6 each Topical Q0600   cyclobenzaprine  10 mg Oral QHS   epoetin (EPOGEN/PROCRIT) injection  4,000 Units Intravenous Q M,W,F-HD   gabapentin  100 mg Oral TID   lidocaine  2 patch Transdermal Q24H   nicotine  21 mg Transdermal Daily   Continuous Infusions:  ampicillin (OMNIPEN) IV 2 g (08/12/21  1650)   sodium chloride     sodium chloride       LOS: 3 days     Enzo Bi, MD [facility] If 7PM-7AM, please contact night-coverage 08/12/2021, 5:26 PM

## 2021-08-12 NOTE — Consult Note (Signed)
NAME: [name] [name]  DOB: [birth year]  MRN: [id number]  Date/Time: 08/12/2021 6:05 PM  REQUESTING PROVIDER: Dr.lai Subjective:  REASON FOR CONSULT: bacteremia ? [name] [name] is a 60 y.o. with a history of ESRD, past h/o treated MSSA bacteremia in 2014 without any evidence of endocarditis, rt pneumothorax, HTN, IJ  vein thrombosis presents to the ED on 08/08/21 with acute lower back pain of 1 week duration. It was constant and he could not walk. He said he felt hot . He does not make any urine. He did not c/o fecal incontinence. He does not have numbness or weakness legs but because of back pain getting worse on walking or moving his leg his mobility is limited. In the ED vitals 113/57, Temp 98, HR 81, RR 18 and sats 100% Labs showed wbc 9.3, Hb 9.6, PLT 136,cr 8.43 CT lumbar spine done on 08/08/21 did not show any infection but multilevel degenerative changes and foraminal stenosis L4-L5 On 9/2 hr had a temp of 102.5 and blood cultures were sent and was positive for enterococcus. MRI was ordered by Dr.lai but he could not complete the study and it was suboptimal I am seeing the patient for the bacteremia as concern for lumbar vertebral infection and also to r/o endocarditis Pt was initially started on vanco and ceftriaxone and it is changed to ampicillin   Past Medical History:  Diagnosis Date   Anemia of chronic disease    BL Hgb ~10   CHF (congestive heart failure) ([place])    pt denies 04/01/14   Diverticulitis    End-stage renal disease on hemodialysis ([place])    HD m/w/f - right arm graft   History of blood transfusion    Hypertension    Pneumonia    hx of   Pneumonia    Polysubstance abuse ([place])    Renal insufficiency    Secondary hyperparathyroidism ([place])    Sleep disturbance    Tobacco abuse     Past Surgical History:  Procedure Laterality Date   APPENDECTOMY     CHEST TUBE INSERTION  02/2013   COLONOSCOPY  12/16/2012   Procedure: COLONOSCOPY;  Surgeon: Milus Banister, MD;   Location: [place];  Service: Endoscopy;  Laterality: N/A;   PLEURAL EFFUSION DRAINAGE  12/18/2012   Procedure: DRAINAGE OF PLEURAL EFFUSION;  Surgeon: Melrose Nakayama, MD;  Location: [place];  Service: Thoracic;  Laterality: Right;   surgery for horseshoe kidney     TALC PLEURODESIS Right 02/05/2013   Procedure: Pietro Cassis;  Surgeon: Melrose Nakayama, MD;  Location: [place];  Service: Thoracic;  Laterality: Right;   TEE WITHOUT CARDIOVERSION N/A 01/16/2013   Procedure: TRANSESOPHAGEAL ECHOCARDIOGRAM (TEE);  Surgeon: Sanda Klein, MD;  Location: [place];  Service: Cardiovascular;  Laterality: N/A;   VIDEO ASSISTED THORACOSCOPY  12/18/2012   Procedure: VIDEO ASSISTED THORACOSCOPY;  Surgeon: Melrose Nakayama, MD;  Location: [place];  Service: Thoracic;  Laterality: Right;  pleural peel   VIDEO ASSISTED THORACOSCOPY (VATS)/THOROCOTOMY Right 02/19/2013   Procedure: VIDEO ASSISTED THORACOSCOPY (VATS)/THOROCOTOMY;  Surgeon: Melrose Nakayama, MD;  Location: [place];  Service: Thoracic;  Laterality: Right;  Right Video Assisted Thoracoscopy, Right Thoracotomy, Decortication,    VIDEO BRONCHOSCOPY WITH INSERTION OF INTERBRONCHIAL VALVE (IBV)  01/09/2013   Procedure: VIDEO BRONCHOSCOPY WITH INSERTION OF INTERBRONCHIAL VALVE (IBV);  Surgeon: Melrose Nakayama, MD;  Location: [place];  Service: Thoracic;  Laterality: N/A;  placement of 2 Intrabronchial valves in right lower lobe  VIDEO BRONCHOSCOPY WITH INSERTION OF INTERBRONCHIAL VALVE (IBV) Right 02/19/2013   Procedure: VIDEO BRONCHOSCOPY WITH INSERTION OF INTERBRONCHIAL VALVE (IBV);  Surgeon: Melrose Nakayama, MD;  Location: [place];  Service: Thoracic;  Laterality: Right;  removal of two interbronchial valves right chest    Social History   Socioeconomic History   Marital status: Single    Spouse name: Not on file   Number of children: 2   Years of education: 33   Highest education level: Not on file  Occupational History    Occupation: disabled    Comment: previously drove trucks  Tobacco Use   Smoking status: Every Day    Packs/day: 6.00    Years: 20.00    Pack years: 120.00    Types: Cigarettes   Smokeless tobacco: Current  Vaping Use   Vaping Use: Never used  Substance and Sexual Activity   Alcohol use: Yes    Alcohol/week: 3.0 standard drinks    Types: 3 Cans of beer per week    Comment: occasional   Drug use: Yes    Types: Marijuana, Cocaine    Comment: remote cocaine abuse, ongoing marijuana use.   Sexual activity: Not on file  Other Topics Concern   Not on file  Social History Narrative   Not on file   Social Determinants of Health   Financial Resource Strain: Not on file  Food Insecurity: Not on file  Transportation Needs: Not on file  Physical Activity: Not on file  Stress: Not on file  Social Connections: Not on file  Intimate Partner Violence: Not on file    Family History  Problem Relation Age of Onset   Thyroid disease Mother    Renal Disease Maternal Grandmother    Diabetes Maternal Grandmother    Hypertension Maternal Aunt    Hypertension Maternal Uncle    Allergies  Allergen Reactions   Other     Other reaction(s): Other (See Comments) blister   Tape Other (See Comments)    Plastic Tape Causes blister   I? Current Facility-Administered Medications  Medication Dose Route Frequency Provider Last Rate Last Admin   acetaminophen (TYLENOL) tablet 650 mg  650 mg Oral Q6H PRN Ivor Costa, MD   650 mg at 08/12/21 0004   ampicillin (OMNIPEN) 2 g in sodium chloride 0.9 % 100 mL IVPB  2 g Intravenous Q12H Zierle-Ghosh, Asia B, DO 300 mL/hr at 08/12/21 1650 2 g at 08/12/21 1650   apixaban (ELIQUIS) tablet 2.5 mg  2.5 mg Oral BID Murlean Iba, MD   2.5 mg at 08/12/21 0937   calcium acetate (PHOSLO) capsule 2,668 mg  2,668 mg Oral TID WC Ivor Costa, MD   2,668 mg at 08/12/21 1629   Chlorhexidine Gluconate Cloth 2 % PADS 6 each  6 each Topical Q0600 Colon Flattery, NP   6  each at 08/12/21 1884   cyclobenzaprine (FLEXERIL) tablet 10 mg  10 mg Oral QHS Enzo Bi, MD       epoetin alfa (EPOGEN) injection 4,000 Units  4,000 Units Intravenous Q M,W,F-HD Colon Flattery, NP   4,000 Units at 08/12/21 1216   gabapentin (NEURONTIN) capsule 100 mg  100 mg Oral TID Enzo Bi, MD   100 mg at 08/12/21 1629   hydrALAZINE (APRESOLINE) injection 5 mg  5 mg Intravenous Q2H PRN Ivor Costa, MD       lidocaine (LIDODERM) 5 % 2 patch  2 patch Transdermal Q24H Enzo Bi, MD   2 patch at 08/12/21 1630  Muscle Rub CREA   Topical PRN Enzo Bi, MD       nicotine (NICODERM CQ - dosed in mg/24 hours) patch 21 mg  21 mg Transdermal Daily Ivor Costa, MD   21 mg at 08/12/21 0937   ondansetron [facility]) injection 4 mg  4 mg Intravenous Q8H PRN Ivor Costa, MD   4 mg at 08/11/21 2301   oxyCODONE-acetaminophen (PERCOCET/ROXICET) 5-325 MG per tablet 1 tablet  1 tablet Oral Q6H PRN Enzo Bi, MD   1 tablet at 08/12/21 1629   sodium chloride 0.9 % bolus 250 mL  250 mL Intravenous Once Zierle-Ghosh, Asia B, DO       sodium chloride 0.9 % bolus 500 mL  500 mL Intravenous Once Enzo Bi, MD         Abtx:  Anti-infectives (From admission, onward)    Start     Dose/Rate Route Frequency Ordered Stop   08/14/21 1200  vancomycin (VANCOCIN) IVPB 1000 mg/200 mL premix  Status:  Discontinued        1,000 mg 200 mL/hr over 60 Minutes Intravenous Every M-W-F (Hemodialysis) 08/11/21 1831 08/12/21 0517   08/12/21 0615  ampicillin (OMNIPEN) 2 g in sodium chloride 0.9 % 100 mL IVPB        2 g 300 mL/hr over 20 Minutes Intravenous Every 12 hours 08/12/21 0520     08/11/21 1930  vancomycin (VANCOREADY) IVPB 2000 mg/400 mL        2,000 mg 200 mL/hr over 120 Minutes Intravenous  Once 08/11/21 1831 08/12/21 0156   08/11/21 1900  cefTRIAXone (ROCEPHIN) 2 g in sodium chloride 0.9 % 100 mL IVPB  Status:  Discontinued        2 g 200 mL/hr over 30 Minutes Intravenous Every 12 hours 08/11/21 1829 08/12/21 0517        REVIEW OF SYSTEMS:  Const:  fever, chills, negative weight loss Eyes: negative diplopia or visual changes, negative eye pain ENT: negative coryza, negative sore throat Resp: negative cough, hemoptysis, dyspnea Cards: negative for chest pain, palpitations, lower extremity edema GU: does not make urine GI: Negative for abdominal pain, diarrhea, bleeding, constipation Skin: negative for rash and pruritus Heme: negative for easy bruising and gum/nose bleeding MS: severe back pain Neurolo:negative for headaches, dizziness, vertigo, memory problems  Psych:  anxiety,   Endocrine: negative for thyroid, diabetes Allergy/Immunology- as above Objective:  VITALS:  BP (!) 121/56 (BP Location: Left Arm)   Pulse (!) 102   Temp 98.1 F (36.7 C) (Oral)   Resp 17   Ht 6\' 1"  (1.854 m)   Wt 91 kg   SpO2 94%   BMI 26.47 kg/m  PHYSICAL EXAM:  General: Alert,in distress, trying to sit up but the back pain is severe .  Head: Normocephalic, without obvious abnormality, atraumatic. Eyes: Conjunctivae clear, anicteric sclerae. Pupils are equal ENT Nares normal. No drainage or sinus tenderness. Lips, mucosa, and tongue normal. No Thrush Neck:symmetrical Back: could not roll him to the side to look Lungs: decreased air entry rt side Heart: s1s2 Abdomen: Soft, non-tender,not distended. Bowel sounds normal. No masses Extremities: Rt AVfistula Skin: No rashes or lesions. Or bruising Lymph: Cervical, supraclavicular normal. Neurologic: Grossly non-focal Pertinent Labs Lab Results CBC    Component Value Date/Time   WBC 9.6 08/12/2021 0613   RBC 3.14 (L) 08/12/2021 0613   HGB 9.2 (L) 08/12/2021 0613   HGB 9.3 (L) 06/22/2013 1116   HCT 30.2 (L) 08/12/2021 0613   HCT 29.6 (L)  06/22/2013 1116   PLT 143 (L) 08/12/2021 0613   PLT 195 06/22/2013 1116   MCV 96.2 08/12/2021 0613   MCV 89 06/22/2013 1116   MCH 29.3 08/12/2021 0613   MCHC 30.5 08/12/2021 0613   RDW 16.6 (H) 08/12/2021 0613   RDW  19.3 (H) 06/22/2013 1116   LYMPHSABS 0.7 08/08/2021 0816   LYMPHSABS 1.2 12/11/2012 0153   MONOABS 0.8 08/08/2021 0816   MONOABS 0.7 12/11/2012 0153   EOSABS 0.0 08/08/2021 0816   EOSABS 0.2 12/11/2012 0153   BASOSABS 0.0 08/08/2021 0816   BASOSABS 0.0 12/11/2012 0153    CMP Latest Ref Rng & Units 08/12/2021 08/11/2021 08/10/2021  Glucose 70 - 99 mg/dL 94 89 83  BUN 6 - 20 mg/dL 56(H) 65(H) 47(H)  Creatinine 0.61 - 1.24 mg/dL 8.52(H) 9.61(H) 7.87(H)  Sodium 135 - 145 mmol/L 138 136 137  Potassium 3.5 - 5.1 mmol/L 4.9 5.0 4.4  Chloride 98 - 111 mmol/L 98 99 99  CO2 22 - 32 mmol/L 28 28 29   Calcium 8.9 - 10.3 mg/dL 9.6 9.9 9.3  Total Protein 6.5 - 8.1 g/dL - - -  Total Bilirubin 0.3 - 1.2 mg/dL - - -  Alkaline Phos 38 - 126 U/L - - -  AST 15 - 41 U/L - - -  ALT 0 - 44 U/L - - -      Microbiology: Recent Results (from the past 240 hour(s))  Resp Panel by RT-PCR (Flu A&B, Covid) Nasopharyngeal Swab     Status: None   Collection Time: 08/08/21  1:22 PM   Specimen: Nasopharyngeal Swab; Nasopharyngeal(NP) swabs in vial transport medium  Result Value Ref Range Status   SARS Coronavirus 2 by RT PCR NEGATIVE NEGATIVE Final    Comment: (NOTE) SARS-CoV-2 target nucleic acids are NOT DETECTED.  The SARS-CoV-2 RNA is generally detectable in upper respiratory specimens during the acute phase of infection. The lowest concentration of SARS-CoV-2 viral copies this assay can detect is 138 copies/mL. A negative result does not preclude SARS-Cov-2 infection and should not be used as the sole basis for treatment or other patient management decisions. A negative result may occur with  improper specimen collection/handling, submission of specimen other than nasopharyngeal swab, presence of viral mutation(s) within the areas targeted by this assay, and inadequate number of viral copies(<138 copies/mL). A negative result must be combined with clinical observations, patient history, and  epidemiological information. The expected result is Negative.  Fact Sheet for Patients:  EntrepreneurPulse.com.au  Fact Sheet for Healthcare Providers:  IncredibleEmployment.be  This test is no t yet approved or cleared by the [place] FDA and  has been authorized for detection and/or diagnosis of SARS-CoV-2 by FDA under an Emergency Use Authorization (EUA). This EUA will remain  in effect (meaning this test can be used) for the duration of the COVID-19 declaration under Section 564(b)(1) of the Act, 21 U.S.C.section 360bbb-3(b)(1), unless the authorization is terminated  or revoked sooner.       Influenza A by PCR NEGATIVE NEGATIVE Final   Influenza B by PCR NEGATIVE NEGATIVE Final    Comment: (NOTE) The Xpert Xpress SARS-CoV-2/FLU/RSV plus assay is intended as an aid in the diagnosis of influenza from Nasopharyngeal swab specimens and should not be used as a sole basis for treatment. Nasal washings and aspirates are unacceptable for Xpert Xpress SARS-CoV-2/FLU/RSV testing.  Fact Sheet for Patients: EntrepreneurPulse.com.au  Fact Sheet for Healthcare Providers: IncredibleEmployment.be  This test is not yet approved or cleared by  the [employer] and has been authorized for detection and/or diagnosis of SARS-CoV-2 by FDA under an Emergency Use Authorization (EUA). This EUA will remain in effect (meaning this test can be used) for the duration of the COVID-19 declaration under Section 564(b)(1) of the Act, 21 U.S.C. section 360bbb-3(b)(1), unless the authorization is terminated or revoked.  Performed at [facility], [address] [place], [place] [address]   Culture, blood (Routine X 2) w Reflex to ID Panel     Status: None (Preliminary result)   Collection Time: 08/11/21  5:47 PM   Specimen: BLOOD  Result Value Ref Range Status   Specimen Description BLOOD LEFT HAND  Final    Special Requests   Final    BOTTLES DRAWN AEROBIC AND ANAEROBIC Blood Culture adequate volume   Culture  Setup Time   Final    GRAM POSITIVE COCCI IN CHAINS IN BOTH AEROBIC AND ANAEROBIC BOTTLES Gram Stain Report Called to,Read Back By and Verified With: JASON ROBBINS 08/12/21 @ 0507 BY SB Performed at [facility], [address] [place], [place] [address]    Culture GRAM POSITIVE COCCI IN CHAINS  Final   Report Status PENDING  Incomplete  Culture, blood (Routine X 2) w Reflex to ID Panel     Status: None (Preliminary result)   Collection Time: 08/11/21  5:47 PM   Specimen: BLOOD  Result Value Ref Range Status   Specimen Description   Final    BLOOD FOA Performed at [facility] [address], [place], [place] [address]    Special Requests   Final    BOTTLES DRAWN AEROBIC AND ANAEROBIC Blood Culture adequate volume Performed at [facility], [address] [place], [place] [address]    Culture  Setup Time   Final    GRAM POSITIVE COCCI IN CHAINS IN BOTH AEROBIC AND ANAEROBIC BOTTLES Gram Stain Report Called to,Read Back By and Verified With: JASON ROBBINS 08/12/21 @ 0507 BY SB Performed at [facility], [place] [address] [place], [place] [address]    Culture GRAM POSITIVE COCCI  Final   Report Status PENDING  Incomplete  Blood Culture ID Panel (Reflexed)     Status: Abnormal   Collection Time: 08/11/21  5:47 PM  Result Value Ref Range Status   Enterococcus faecalis DETECTED (A) NOT DETECTED Final    Comment: CRITICAL RESULT CALLED TO, READ BACK BY AND VERIFIED WITH: JASON ROBBINS 08/12/21 @ 0507 BY SB    Enterococcus Faecium NOT DETECTED NOT DETECTED Final   Listeria monocytogenes NOT DETECTED NOT DETECTED Final   Staphylococcus species NOT DETECTED NOT DETECTED Final   Staphylococcus aureus (BCID) NOT DETECTED NOT DETECTED Final   Staphylococcus epidermidis NOT DETECTED NOT DETECTED Final   Staphylococcus lugdunensis NOT DETECTED NOT DETECTED  Final   Streptococcus species NOT DETECTED NOT DETECTED Final   Streptococcus agalactiae NOT DETECTED NOT DETECTED Final   Streptococcus pneumoniae NOT DETECTED NOT DETECTED Final   Streptococcus pyogenes NOT DETECTED NOT DETECTED Final   A.calcoaceticus-baumannii NOT DETECTED NOT DETECTED Final   Bacteroides fragilis NOT DETECTED NOT DETECTED Final   Enterobacterales NOT DETECTED NOT DETECTED Final   Enterobacter cloacae complex NOT DETECTED NOT DETECTED Final   Escherichia coli NOT DETECTED NOT DETECTED Final   Klebsiella aerogenes NOT DETECTED NOT DETECTED Final   Klebsiella oxytoca NOT DETECTED NOT DETECTED Final   Klebsiella pneumoniae NOT DETECTED NOT DETECTED Final   Proteus species NOT DETECTED NOT DETECTED Final  Salmonella species NOT DETECTED NOT DETECTED Final   Serratia marcescens NOT DETECTED NOT DETECTED Final   Haemophilus influenzae NOT DETECTED NOT DETECTED Final   Neisseria meningitidis NOT DETECTED NOT DETECTED Final   Pseudomonas aeruginosa NOT DETECTED NOT DETECTED Final   Stenotrophomonas maltophilia NOT DETECTED NOT DETECTED Final   Candida albicans NOT DETECTED NOT DETECTED Final   Candida auris NOT DETECTED NOT DETECTED Final   Candida glabrata NOT DETECTED NOT DETECTED Final   Candida krusei NOT DETECTED NOT DETECTED Final   Candida parapsilosis NOT DETECTED NOT DETECTED Final   Candida tropicalis NOT DETECTED NOT DETECTED Final   Cryptococcus neoformans/gattii NOT DETECTED NOT DETECTED Final   Vancomycin resistance NOT DETECTED NOT DETECTED Final    Comment: Performed at [facility], [place], [place] [address]    IMAGING RESULTS:  I have personally reviewed the films ?No evidence of acute cardiopulmonary process. Stable chronic volume loss and pleuroparenchymal scarring in the right hemithorax.   Impression/Recommendation ? 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

## 2021-08-12 NOTE — Progress Notes (Signed)
Patient started treatment as ordered. Patient request to be taken off dialysis machine, NP notified. Patient was rinsed back. Post treatment, patient had prolong bleeding to arterial puncture site. Report given to floor nurse, Patterson Hammersmith.

## 2021-08-13 ENCOUNTER — Inpatient Hospital Stay: Payer: Medicare Other

## 2021-08-13 ENCOUNTER — Inpatient Hospital Stay (HOSPITAL_COMMUNITY)
Admit: 2021-08-13 | Discharge: 2021-08-13 | Disposition: A | Discharge status: Home / Self Care | Payer: Medicare Other | Attending: Hospitalist | Admitting: Hospitalist

## 2021-08-13 DIAGNOSIS — R7881 Bacteremia: Secondary | ICD-10-CM | POA: Diagnosis not present

## 2021-08-13 DIAGNOSIS — M5440 Lumbago with sciatica, unspecified side: Secondary | ICD-10-CM | POA: Diagnosis not present

## 2021-08-13 LAB — ECHOCARDIOGRAM COMPLETE
AR max vel: 1.64 cm2
AV Area VTI: 1.52 cm2
AV Area mean vel: 1.37 cm2
AV Mean grad: 20.3 mmHg
AV Peak grad: 33.8 mmHg
Ao pk vel: 2.91 m/s
Area-P 1/2: 5.02 cm2
Calc EF: 36.1 %
Height: 73 in
P 1/2 time: 227 msec
S' Lateral: 4.8 cm
Single Plane A2C EF: 34.3 %
Single Plane A4C EF: 35.5 %
Weight: 3209.9 oz

## 2021-08-13 LAB — BASIC METABOLIC PANEL
Anion gap: 10 (ref 5–15)
BUN: 55 mg/dL — ABNORMAL HIGH (ref 6–20)
CO2: 29 mmol/L (ref 22–32)
Calcium: 10 mg/dL (ref 8.9–10.3)
Chloride: 97 mmol/L — ABNORMAL LOW (ref 98–111)
Creatinine, Ser: 7.92 mg/dL — ABNORMAL HIGH (ref 0.61–1.24)
GFR, Estimated: 7 mL/min — ABNORMAL LOW (ref >60)
Glucose, Bld: 90 mg/dL (ref 70–99)
Potassium: 4.6 mmol/L (ref 3.5–5.1)
Sodium: 136 mmol/L (ref 135–145)

## 2021-08-13 LAB — PROCALCITONIN: Procalcitonin: 4.61 ng/mL

## 2021-08-13 LAB — CBC
HCT: 26.9 % — ABNORMAL LOW (ref 39.0–52.0)
Hemoglobin: 8.4 g/dL — ABNORMAL LOW (ref 13.0–17.0)
MCH: 30.5 pg (ref 26.0–34.0)
MCHC: 31.2 g/dL (ref 30.0–36.0)
MCV: 97.8 fL (ref 80.0–100.0)
Platelets: 138 10*3/uL — ABNORMAL LOW (ref 150–400)
RBC: 2.75 MIL/uL — ABNORMAL LOW (ref 4.22–5.81)
RDW: 16.5 % — ABNORMAL HIGH (ref 11.5–15.5)
WBC: 8.8 10*3/uL (ref 4.0–10.5)
nRBC: 0 % (ref 0.0–0.2)

## 2021-08-13 LAB — MAGNESIUM: Magnesium: 1.9 mg/dL (ref 1.7–2.4)

## 2021-08-13 MED ORDER — LORAZEPAM 2 MG/ML IJ SOLN
1.0000 mg | Freq: Once | INTRAMUSCULAR | Status: DC | PRN
Start: 2021-08-13 — End: 2021-08-26

## 2021-08-13 MED ORDER — HYDROMORPHONE HCL 1 MG/ML IJ SOLN
1.0000 mg | Freq: Once | INTRAMUSCULAR | Status: AC | PRN
Start: 2021-08-13 — End: 2021-08-13
  Administered 2021-08-13: 1 mg via INTRAVENOUS
  Filled 2021-08-13: qty 1

## 2021-08-13 MED ORDER — HALOPERIDOL LACTATE 5 MG/ML IJ SOLN
5.0000 mg | Freq: Once | INTRAMUSCULAR | Status: AC
  Administered 2021-08-13: 5 mg via INTRAVENOUS
  Filled 2021-08-13: qty 1

## 2021-08-13 MED ORDER — GADOBUTROL 1 MMOL/ML IV SOLN
9.0000 mL | Freq: Once | INTRAVENOUS | Status: AC | PRN
  Administered 2021-08-13: 10 mL via INTRAVENOUS

## 2021-08-13 NOTE — Progress Notes (Signed)
PROGRESS NOTE    [name] [name]  CXK:[id number] DOB: 1960-12-26 DOA: 08/08/2021 PCP: Pcp, No  142A/142A-AA   Assessment & Plan:   Principal Problem:   Lower back pain Active Problems:   End-stage renal disease on hemodialysis ([place])   Polysubstance abuse ([place])   Tobacco abuse   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 (HCC)   Anemia in ESRD (end-stage renal disease) ([place])   HTN (hypertension)   Back pain   [name] [name] is a 60 y.o. male with medical history significant of ESRD-HD (MWF), hypertension, tobacco abuse, polysubstance abuse, diverticulitis, anemia, jugular vein thrombosis, GI bleeding, right pneumothorax, who presents with lower back pain.   Patient states that he has been having lower back pain for more than 1 week, which has been progressively worsening.  The pain is constant, sharp, 10 out of 10 in severity today, nonradiating.  The pain is so severe that he cannot walk.  On arrival he had to be lifted out of his vehicle by security and nursing staff.     Sepsis 2/2  Enterococcus faecalis bacteremia --developed fever to 101.5 on 9/2, with tachycardia --in the setting of severe back pain, I am concerned about spinal or paraspinal infection.  MRI w contrast attempted, however, pt asked to get off early and did not complete the scan. --started on empiric vanc and ceftriaxone and transitioned to IV amp Plan: --cont IV ampicillin --add back ceftriaxone, per ID --TTE for endocarditis rule out --re-attempt MRI w wo spine today (confirmed with radiologist that [facility] contrast is ok to use in ESRD pt)  Severe Lower back pain:  CT scan showed multilevel degenerative and canal and foraminal stenosis, greatest at L4-L5.  Pt did not have alarming symptoms.   --back pain out of proportion to imaging finding  Plan: --cont Percocet 5 mg q6h PRN --d/c gabapentin --lidocaine patches --re-attempt MRI spine today  End-stage renal disease on hemodialysis  (MWF) --cont phoslo --iHD per nephro --need dialysis after MRI w contrast   Polysubstance abuse and tobacco abuse -Did counseling about importance of quitting substance use and tobacco use --cont nicotine patch   Anemia in ESRD (end-stage renal disease) ([place]):  --Epo with dialysis  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  --Eliquis changed from 5 mg daily to 2.5 mg bid per Dr. Keturah Barre recommendation. --cont Eliquis   HTN, not currently active Hypotension --BP has been intermittently low in 90's --hold amlodipine --small bolus if systolic <97'W.   DVT prophylaxis: YO:VZCHYIF Code Status: Full code  Family Communication:  Level of care: Med-Surg Dispo:   The patient is from: home Anticipated d/c is to: SNF Anticipated d/c date is: undetermined  Patient currently is NOT ready to d/c.  Enterococcus bacteremia on IV abx   Subjective and Interval History:  Pt has been alternating between somnolent from pain meds or crying in pain.  Not eating much.    Attempt at MRI spine again later today, with pre-medication for pain.   Objective: Vitals:   08/13/21 0541 08/13/21 0848 08/13/21 1239 08/13/21 1559  BP: (!) 97/48 (!) 109/48 (!) 100/55 110/60  Pulse: 98 97 91 94  Resp: 18 14 16 16   Temp: 97.6 F (36.4 C) 97.8 F (36.6 C) 98.4 F (36.9 C) 97.7 F (36.5 C)  TempSrc: Axillary   Oral  SpO2: 100% (!) 85% 100% 99%  Weight:      Height:        Intake/Output Summary (Last 24 hours)  at 08/13/2021 1641 Last data filed at 08/13/2021 0300 Gross per 24 hour  Intake 100 ml  Output --  Net 100 ml    Filed Weights   08/08/21 0625  Weight: 91 kg    Examination:   Constitutional: NAD, somnolent, arousable HEENT: conjunctivae and lids normal, EOMI CV: No cyanosis.   RESP: normal respiratory effort, on RA SKIN: warm, dry Abdomen: dilated veins all over trunk and abdomen   Data Reviewed: I have personally reviewed following labs and imaging  studies  CBC: Recent Labs  Lab 08/08/21 0816 08/10/21 0347 08/11/21 0226 08/12/21 0613 08/13/21 0827  WBC 9.3 8.8 8.5 9.6 8.8  NEUTROABS 7.6  --   --   --   --   HGB 9.6* 10.0* 9.9* 9.2* 8.4*  HCT 30.3* 31.9* 31.1* 30.2* 26.9*  MCV 95.3 93.3 96.0 96.2 97.8  PLT 136* 150 145* 143* 903*   Basic Metabolic Panel: Recent Labs  Lab 08/09/21 0451 08/10/21 0347 08/11/21 0226 08/12/21 0613 08/13/21 0827  NA 137 137 136 138 136  K 5.1 4.4 5.0 4.9 4.6  CL 101 99 99 98 97*  CO2 26 29 28 28 29   GLUCOSE 107* 83 89 94 90  BUN 67* 47* 65* 56* 55*  CREATININE 10.22* 7.87* 9.61* 8.52* 7.92*  CALCIUM 9.5 9.3 9.9 9.6 10.0  MG  --  2.0 2.2 2.0 1.9  PHOS  --   --  7.6*  --   --    GFR: Estimated Creatinine Clearance: 11.6 mL/min (A) (by C-G formula based on SCr of 7.92 mg/dL (H)). Liver Function Tests: Recent Labs  Lab 08/08/21 0816  AST 16  ALT 13  ALKPHOS 79  BILITOT 1.1  PROT 6.9  ALBUMIN 2.6*   No results for input(s): LIPASE, AMYLASE in the last 168 hours. No results for input(s): AMMONIA in the last 168 hours. Coagulation Profile: No results for input(s): INR, PROTIME in the last 168 hours. Cardiac Enzymes: No results for input(s): CKTOTAL, CKMB, CKMBINDEX, TROPONINI in the last 168 hours. BNP (last 3 results) No results for input(s): PROBNP in the last 8760 hours. HbA1C: No results for input(s): HGBA1C in the last 72 hours. CBG: No results for input(s): GLUCAP in the last 168 hours. Lipid Profile: No results for input(s): CHOL, HDL, LDLCALC, TRIG, CHOLHDL, LDLDIRECT in the last 72 hours. Thyroid Function Tests: No results for input(s): TSH, T4TOTAL, FREET4, T3FREE, THYROIDAB in the last 72 hours. Anemia Panel: No results for input(s): VITAMINB12, FOLATE, FERRITIN, TIBC, IRON, RETICCTPCT in the last 72 hours. Sepsis Labs: Recent Labs  Lab 08/11/21 0226 08/11/21 1747 08/11/21 2002 08/12/21 0613 08/13/21 0827  PROCALCITON 5.82  --   --  5.15 4.61   LATICACIDVEN  --  0.9 1.1  --   --     Recent Results (from the past 240 hour(s))  Resp Panel by RT-PCR (Flu A&B, Covid) Nasopharyngeal Swab     Status: None   Collection Time: 08/08/21  1:22 PM   Specimen: Nasopharyngeal Swab; Nasopharyngeal(NP) swabs in vial transport medium  Result Value Ref Range Status   SARS Coronavirus 2 by RT PCR NEGATIVE NEGATIVE Final    Comment: (NOTE) SARS-CoV-2 target nucleic acids are NOT DETECTED.  The SARS-CoV-2 RNA is generally detectable in upper respiratory specimens during the acute phase of infection. The lowest concentration of SARS-CoV-2 viral copies this assay can detect is 138 copies/mL. A negative result does not preclude SARS-Cov-2 infection and should not be used as the sole  basis for treatment or other patient management decisions. A negative result may occur with  improper specimen collection/handling, submission of specimen other than nasopharyngeal swab, presence of viral mutation(s) within the areas targeted by this assay, and inadequate number of viral copies(<138 copies/mL). A negative result must be combined with clinical observations, patient history, and epidemiological information. The expected result is Negative.  Fact Sheet for Patients:  EntrepreneurPulse.com.au  Fact Sheet for Healthcare Providers:  IncredibleEmployment.be  This test is no t yet approved or cleared by the [place] FDA and  has been authorized for detection and/or diagnosis of SARS-CoV-2 by FDA under an Emergency Use Authorization (EUA). This EUA will remain  in effect (meaning this test can be used) for the duration of the COVID-19 declaration under Section 564(b)(1) of the Act, 21 U.S.C.section 360bbb-3(b)(1), unless the authorization is terminated  or revoked sooner.       Influenza A by PCR NEGATIVE NEGATIVE Final   Influenza B by PCR NEGATIVE NEGATIVE Final    Comment: (NOTE) The Xpert Xpress  SARS-CoV-2/FLU/RSV plus assay is intended as an aid in the diagnosis of influenza from Nasopharyngeal swab specimens and should not be used as a sole basis for treatment. Nasal washings and aspirates are unacceptable for Xpert Xpress SARS-CoV-2/FLU/RSV testing.  Fact Sheet for Patients: EntrepreneurPulse.com.au  Fact Sheet for Healthcare Providers: IncredibleEmployment.be  This test is not yet approved or cleared by the [place] FDA and has been authorized for detection and/or diagnosis of SARS-CoV-2 by FDA under an Emergency Use Authorization (EUA). This EUA will remain in effect (meaning this test can be used) for the duration of the COVID-19 declaration under Section 564(b)(1) of the Act, 21 U.S.C. section 360bbb-3(b)(1), unless the authorization is terminated or revoked.  Performed at [facility], [address] [place], [place] [address]   Culture, blood (Routine X 2) w Reflex to ID Panel     Status: Abnormal (Preliminary result)   Collection Time: 08/11/21  5:47 PM   Specimen: BLOOD  Result Value Ref Range Status   Specimen Description   Final    BLOOD LEFT HAND Performed at [facility], [address] [place], [place] [address]    Special Requests   Final    BOTTLES DRAWN AEROBIC AND ANAEROBIC Blood Culture adequate volume Performed at [facility], [address] [place], [place] [address]    Culture  Setup Time   Final    GRAM POSITIVE COCCI IN CHAINS IN BOTH AEROBIC AND ANAEROBIC BOTTLES Gram Stain Report Called to,Read Back By and Verified With: JASON ROBBINS 08/12/21 @ 0507 BY SB Performed at [facility], [address] [place], [place] [address]    Culture (A)  Final    ENTEROCOCCUS FAECALIS CULTURE REINCUBATED FOR BETTER GROWTH Performed at [facility], [place] [address] [place], [place] [address]    Report Status PENDING  Incomplete  Culture, blood (Routine X 2) w  Reflex to ID Panel     Status: Abnormal (Preliminary result)   Collection Time: 08/11/21  5:47 PM   Specimen: BLOOD  Result Value Ref Range Status   Specimen Description   Final    BLOOD FOA Performed at [facility], [address] [place], [place] [address]    Special Requests   Final    BOTTLES DRAWN AEROBIC AND ANAEROBIC Blood Culture adequate volume Performed at [facility], [address], [place], [place] [address]    Culture  Setup Time  Final    GRAM POSITIVE COCCI IN CHAINS IN BOTH AEROBIC AND ANAEROBIC BOTTLES Gram Stain Report Called to,Read Back By and Verified With: JASON ROBBINS 08/12/21 @ 0507 BY SB    Culture (A)  Final    ENTEROCOCCUS FAECALIS SUSCEPTIBILITIES TO FOLLOW Performed at [facility], [place] [address], [place], [place] [address]    Report Status PENDING  Incomplete  Blood Culture ID Panel (Reflexed)     Status: Abnormal   Collection Time: 08/11/21  5:47 PM  Result Value Ref Range Status   Enterococcus faecalis DETECTED (A) NOT DETECTED Final    Comment: CRITICAL RESULT CALLED TO, READ BACK BY AND VERIFIED WITH: JASON ROBBINS 08/12/21 @ 0507 BY SB    Enterococcus Faecium NOT DETECTED NOT DETECTED Final   Listeria monocytogenes NOT DETECTED NOT DETECTED Final   Staphylococcus species NOT DETECTED NOT DETECTED Final   Staphylococcus aureus (BCID) NOT DETECTED NOT DETECTED Final   Staphylococcus epidermidis NOT DETECTED NOT DETECTED Final   Staphylococcus lugdunensis NOT DETECTED NOT DETECTED Final   Streptococcus species NOT DETECTED NOT DETECTED Final   Streptococcus agalactiae NOT DETECTED NOT DETECTED Final   Streptococcus pneumoniae NOT DETECTED NOT DETECTED Final   Streptococcus pyogenes NOT DETECTED NOT DETECTED Final   A.calcoaceticus-baumannii NOT DETECTED NOT DETECTED Final   Bacteroides fragilis NOT DETECTED NOT DETECTED Final   Enterobacterales NOT DETECTED NOT DETECTED Final   Enterobacter cloacae complex NOT  DETECTED NOT DETECTED Final   Escherichia coli NOT DETECTED NOT DETECTED Final   Klebsiella aerogenes NOT DETECTED NOT DETECTED Final   Klebsiella oxytoca NOT DETECTED NOT DETECTED Final   Klebsiella pneumoniae NOT DETECTED NOT DETECTED Final   Proteus species NOT DETECTED NOT DETECTED Final   Salmonella species NOT DETECTED NOT DETECTED Final   Serratia marcescens NOT DETECTED NOT DETECTED Final   Haemophilus influenzae NOT DETECTED NOT DETECTED Final   Neisseria meningitidis NOT DETECTED NOT DETECTED Final   Pseudomonas aeruginosa NOT DETECTED NOT DETECTED Final   Stenotrophomonas maltophilia NOT DETECTED NOT DETECTED Final   Candida albicans NOT DETECTED NOT DETECTED Final   Candida auris NOT DETECTED NOT DETECTED Final   Candida glabrata NOT DETECTED NOT DETECTED Final   Candida krusei NOT DETECTED NOT DETECTED Final   Candida parapsilosis NOT DETECTED NOT DETECTED Final   Candida tropicalis NOT DETECTED NOT DETECTED Final   Cryptococcus neoformans/gattii NOT DETECTED NOT DETECTED Final   Vancomycin resistance NOT DETECTED NOT DETECTED Final    Comment: Performed at [facility], [address] [place], [place] [address]      Radiology Studies: MR CERVICAL SPINE WO CONTRAST  Result Date: 08/12/2021 CLINICAL DATA:  Neck pain, acute.  Infection suspected.  Sepsis. EXAM: MRI CERVICAL AND THORACIC SPINE WITHOUT CONTRAST TECHNIQUE: Multiplanar and multiecho pulse sequences of the cervical spine, to include the craniocervical junction and cervicothoracic junction, and the thoracic spine, were obtained without intravenous contrast. COMPARISON:  Lumbar spine CT 08/08/2021. FINDINGS: MRI CERVICAL SPINE FINDINGS Alignment: Unremarkable Vertebrae: Fatty marrow conversion at C5-6. No visible marrow inflammation, acute fracture, or aggressive bone lesion. Cord: No visible swelling or edema. No evidence of spinal collection. Posterior Fossa, vertebral arteries, paraspinal tissues: Mild  prevertebral/retropharyngeal edema without visible source. Disc levels: Focal disc degeneration at C6-7 with chronic inferior endplate fracture or Schmorl's node at C5. Mild or moderate left foraminal narrowing at this level from disc bulging and uncovertebral ridging. MRI THORACIC SPINE FINDINGS Alignment:  Unremarkable Vertebrae: No visible marrow edema, fracture, or  discitis. L2 inferior endplate Schmorl's node fatty marrow conversion. Diffusely low marrow signal. Cord: No visible intrathecal collection. There are pleural effusions bilaterally. Paraspinal and other soft tissues: Prominent paravertebral T2 hyperintensity which is presumably fat that extends into the bilateral subpleural space. Disc levels: No visible herniation or impingement. IMPRESSION: 1. Mild prevertebral/retropharyngeal edema without visible underlying osteomyelitis or septic arthritis. If persistent/progressive symptoms please have low threshold for follow-up given the suboptimal study quality. 2. Generally abnormal marrow signal needing correlation with CBC. Electronically Signed   By: Monte Fantasia M.D.   On: 08/12/2021 09:00   MR THORACIC SPINE WO CONTRAST  Result Date: 08/12/2021 CLINICAL DATA:  Neck pain, acute.  Infection suspected.  Sepsis. EXAM: MRI CERVICAL AND THORACIC SPINE WITHOUT CONTRAST TECHNIQUE: Multiplanar and multiecho pulse sequences of the cervical spine, to include the craniocervical junction and cervicothoracic junction, and the thoracic spine, were obtained without intravenous contrast. COMPARISON:  Lumbar spine CT 08/08/2021. FINDINGS: MRI CERVICAL SPINE FINDINGS Alignment: Unremarkable Vertebrae: Fatty marrow conversion at C5-6. No visible marrow inflammation, acute fracture, or aggressive bone lesion. Cord: No visible swelling or edema. No evidence of spinal collection. Posterior Fossa, vertebral arteries, paraspinal tissues: Mild prevertebral/retropharyngeal edema without visible source. Disc levels: Focal  disc degeneration at C6-7 with chronic inferior endplate fracture or Schmorl's node at C5. Mild or moderate left foraminal narrowing at this level from disc bulging and uncovertebral ridging. MRI THORACIC SPINE FINDINGS Alignment:  Unremarkable Vertebrae: No visible marrow edema, fracture, or discitis. L2 inferior endplate Schmorl's node fatty marrow conversion. Diffusely low marrow signal. Cord: No visible intrathecal collection. There are pleural effusions bilaterally. Paraspinal and other soft tissues: Prominent paravertebral T2 hyperintensity which is presumably fat that extends into the bilateral subpleural space. Disc levels: No visible herniation or impingement. IMPRESSION: 1. Mild prevertebral/retropharyngeal edema without visible underlying osteomyelitis or septic arthritis. If persistent/progressive symptoms please have low threshold for follow-up given the suboptimal study quality. 2. Generally abnormal marrow signal needing correlation with CBC. Electronically Signed   By: Monte Fantasia M.D.   On: 08/12/2021 09:00   ECHOCARDIOGRAM COMPLETE  Result Date: 08/13/2021    ECHOCARDIOGRAM REPORT   Patient Name:   [name] [name] Date of Exam: 08/13/2021 Medical Rec #:  [id number]     Height:       73.0 in Accession #:    [id number]    Weight:       200.6 lb Date of Birth:  1961/03/24     BSA:          2.154 m Patient Age:    27 years      BP:           109/48 mmHg Patient Gender: M             HR:           95 bpm. Exam Location:  [facility] Procedure: 2D Echo, 3D Echo and Strain Analysis Indications:     Bacteremia R78.81  History:         Patient has no prior history of Echocardiogram examinations.  Sonographer:     Kathlen Brunswick RDCS Referring Phys:  [id number] Otila Kluver Jashawna Reever Diagnosing Phys: Kate Sable MD IMPRESSIONS  1. Left ventricular ejection fraction, by estimation, is 30 to 35%. The left ventricle has moderate to severely decreased function. The left ventricle demonstrates global hypokinesis. There is mild  left ventricular hypertrophy. Left ventricular diastolic parameters are consistent with Grade I diastolic dysfunction (impaired relaxation). The average left ventricular global  longitudinal strain is -6.4 %. The global longitudinal strain is abnormal.  2. Right ventricular systolic function is moderately reduced. The right ventricular size is moderately enlarged.  3. Right atrial size was mild to moderately dilated.  4. The mitral valve is degenerative. Mild mitral valve regurgitation.  5. Tricuspid valve regurgitation is mild to moderate.  6. The aortic valve is calcified. Aortic valve regurgitation is moderate. Mild to moderate aortic valve stenosis. Aortic valve area, by VTI measures 1.52 cm. Aortic valve mean gradient measures 20.2 mmHg.  7. The inferior vena cava is normal in size with <50% respiratory variability, suggesting right atrial pressure of 8 mmHg. FINDINGS  Left Ventricle: Left ventricular ejection fraction, by estimation, is 30 to 35%. The left ventricle has moderate to severely decreased function. The left ventricle demonstrates global hypokinesis. The average left ventricular global longitudinal strain is -6.4 %. The global longitudinal strain is abnormal. 3D left ventricular ejection fraction analysis performed but not reported based on interpreter judgement due to suboptimal quality. The left ventricular internal cavity size was normal in size. There  is mild left ventricular hypertrophy. Left ventricular diastolic parameters are consistent with Grade I diastolic dysfunction (impaired relaxation). Right Ventricle: The right ventricular size is moderately enlarged. No increase in right ventricular wall thickness. Right ventricular systolic function is moderately reduced. Left Atrium: Left atrial size was normal in size. Right Atrium: Right atrial size was mild to moderately dilated. Pericardium: There is no evidence of pericardial effusion. Mitral Valve: The mitral valve is degenerative in  appearance. Mild mitral valve regurgitation. Tricuspid Valve: The tricuspid valve is not well visualized. Tricuspid valve regurgitation is mild to moderate. Aortic Valve: The aortic valve is calcified. Aortic valve regurgitation is moderate. Aortic regurgitation PHT measures 227 msec. Mild to moderate aortic stenosis is present. Aortic valve mean gradient measures 20.2 mmHg. Aortic valve peak gradient measures 33.8 mmHg. Aortic valve area, by VTI measures 1.52 cm. Pulmonic Valve: The pulmonic valve was not well visualized. Pulmonic valve regurgitation is not visualized. Aorta: The aortic root is normal in size and structure. Venous: The inferior vena cava is normal in size with less than 50% respiratory variability, suggesting right atrial pressure of 8 mmHg. IAS/Shunts: No atrial level shunt detected by color flow Doppler.  LEFT VENTRICLE PLAX 2D LVIDd:         6.00 cm      Diastology LVIDs:         4.80 cm      LV e' medial:    5.77 cm/s LV PW:         1.30 cm      LV E/e' medial:  13.7 LV IVS:        1.20 cm      LV e' lateral:   7.18 cm/s LVOT diam:     2.20 cm      LV E/e' lateral: 11.0 LV SV:         82 LV SV Index:   38           2D Longitudinal Strain LVOT Area:     3.80 cm     2D Strain GLS Avg:     -6.4 %  LV Volumes (MOD) LV vol d, MOD A2C: 214.0 ml 3D Volume EF: LV vol d, MOD A4C: 242.0 ml 3D EF:        36 % LV vol s, MOD A2C: 140.5 ml LV EDV:       327 ml LV vol s,  MOD A4C: 156.0 ml LV ESV:       209 ml LV SV MOD A2C:     73.5 ml  LV SV:        119 ml LV SV MOD A4C:     242.0 ml LV SV MOD BP:      83.8 ml RIGHT VENTRICLE RV Basal diam:  4.60 cm TAPSE (M-mode): 1.8 cm LEFT ATRIUM           Index       RIGHT ATRIUM           Index LA diam:      5.10 cm 2.37 cm/m  RA Area:     24.50 cm LA Vol (A2C): 67.2 ml 31.19 ml/m RA Volume:   89.40 ml  41.50 ml/m LA Vol (A4C): 40.2 ml 18.66 ml/m  AORTIC VALVE                    PULMONIC VALVE AV Area (Vmax):    1.64 cm     PV Vmax:       1.03 m/s AV Area  (Vmean):   1.37 cm     PV Peak grad:  4.2 mmHg AV Area (VTI):     1.52 cm AV Vmax:           290.50 cm/s AV Vmean:          211.750 cm/s AV VTI:            0.539 m AV Peak Grad:      33.8 mmHg AV Mean Grad:      20.2 mmHg LVOT Vmax:         125.00 cm/s LVOT Vmean:        76.500 cm/s LVOT VTI:          0.216 m LVOT/AV VTI ratio: 0.40 AI PHT:            227 msec  AORTA Ao Root diam: 3.70 cm Ao Asc diam:  3.80 cm MITRAL VALVE                TRICUSPID VALVE MV Area (PHT): 5.02 cm     TV Peak grad:   32.1 mmHg MV Decel Time: 151 msec     TV Vmax:        2.84 m/s MV E velocity: 78.80 cm/s MV A velocity: 113.00 cm/s  SHUNTS MV E/A ratio:  0.70         Systemic VTI:  0.22 m                             Systemic Diam: 2.20 cm Kate Sable MD Electronically signed by Kate Sable MD Signature Date/Time: 08/13/2021/3:47:36 PM    Final      Scheduled Meds:  apixaban  2.5 mg Oral BID   calcium acetate  2,668 mg Oral TID WC   Chlorhexidine Gluconate Cloth  6 each Topical Q0600   cyclobenzaprine  10 mg Oral QHS   epoetin (EPOGEN/PROCRIT) injection  4,000 Units Intravenous Q M,W,F-HD   gabapentin  100 mg Oral TID   lidocaine  2 patch Transdermal Q24H   nicotine  21 mg Transdermal Daily   Continuous Infusions:  ampicillin (OMNIPEN) IV 2 g (08/13/21 0535)   cefTRIAXone (ROCEPHIN)  IV 2 g (08/12/21 2134)   sodium chloride     sodium chloride       LOS: 4 days  Enzo Bi, MD [facility] If 7PM-7AM, please contact night-coverage 08/13/2021, 4:41 PM

## 2021-08-13 NOTE — Progress Notes (Signed)
*  PRELIMINARY RESULTS* Echocardiogram 2D Echocardiogram has been performed.  [name] [name] 08/13/2021, 12:30 PM

## 2021-08-13 NOTE — Plan of Care (Signed)
Eventful shift. Patient extremely agitated and confused following MRI. Refusing all interventions. Believes that myself and the tech are plotting against him.  Also believes that we are trying to poison him through his oxygen. IV haldol administered. Patient is now resting quietly. VSS. Oxygen saturations maintained WNL on 2L O2 via [place]. Labs rescheduled for 0800 due to patient's state of agitation.  Problem: Education: Goal: Knowledge of General Education information will improve Description: Including pain rating scale, medication(s)/side effects and non-pharmacologic comfort measures Outcome: Progressing   Problem: Health Behavior/Discharge Planning: Goal: Ability to manage health-related needs will improve Outcome: Progressing   Problem: Clinical Measurements: Goal: Ability to maintain clinical measurements within normal limits will improve Outcome: Progressing Goal: Will remain free from infection Outcome: Progressing Goal: Diagnostic test results will improve Outcome: Progressing Goal: Respiratory complications will improve Outcome: Progressing Goal: Cardiovascular complication will be avoided Outcome: Progressing   Problem: Activity: Goal: Risk for activity intolerance will decrease Outcome: Progressing   Problem: Nutrition: Goal: Adequate nutrition will be maintained Outcome: Progressing   Problem: Coping: Goal: Level of anxiety will decrease Outcome: Progressing   Problem: Elimination: Goal: Will not experience complications related to bowel motility Outcome: Progressing Goal: Will not experience complications related to urinary retention Outcome: Progressing   Problem: Pain Managment: Goal: General experience of comfort will improve Outcome: Progressing   Problem: Safety: Goal: Ability to remain free from injury will improve Outcome: Progressing   Problem: Skin Integrity: Goal: Risk for impaired skin integrity will decrease Outcome: Progressing

## 2021-08-13 NOTE — Progress Notes (Signed)
[facility]  ROUNDING NOTE   Subjective:   [name] [name] is a 60 y.o. male with medical conditions including anemia, CHF, hypertension, and ESRD on dialysis. Patient has presented to ED with progressive back pain.   Patient states he has always had back pain but it has increased in the past few days. Denies known precipitating factors. Chest X-ray of lumbar spine shows multilevel degenerative changes and stenosis, greatest at L4-L5.   Patient is known to our practice and receives dialysis at [place] supervised by Dr Juleen China. We have been consulted to manage dialysis treatments.   Patient seen resting quietly in bed Alert and oriented to self and place Denies pain and discomfort Breakfast tray at bedside  Per nursing note, patient very agitated and confused overnight. Stated he needed to go pick up his son. Patient's son and wife are both deceased. Given Haldol   Objective:  Vital signs in last 24 hours:  Temp:  [97.6 F (36.4 C)-100 F (37.8 C)] 97.8 F (36.6 C) (09/04 0848) Pulse Rate:  [84-189] 97 (09/04 0848) Resp:  [10-22] 14 (09/04 0848) BP: (89-121)/(46-64) 109/48 (09/04 0848) SpO2:  [46 %-100 %] 85 % (09/04 0848)  Weight change:  Filed Weights   08/08/21 0625  Weight: 91 kg    Intake/Output: I/O last 3 completed shifts: In: 700 [IV Piggyback:700] Out: 284 [XLKGM:010]   Intake/Output this shift:  No intake/output data recorded.  Physical Exam: General: NAD, resting in bed  Head: Normocephalic, atraumatic. Moist oral mucosal membranes  Eyes: Anicteric  Lungs:  Clear to auscultation, normal effort  Heart: Regular rate and rhythm  Abdomen:  Soft, nontender, distended  Extremities:  no peripheral edema.  Neurologic: Alert, moving all four extremities  Skin: No lesions  Access: Rt AVG    Basic Metabolic Panel: Recent Labs  Lab 08/09/21 0451 08/10/21 0347 08/11/21 0226 08/12/21 0613 08/13/21 0827  NA 137 137 136 138 136  K 5.1  4.4 5.0 4.9 4.6  CL 101 99 99 98 97*  CO2 26 29 28 28 29   GLUCOSE 107* 83 89 94 90  BUN 67* 47* 65* 56* 55*  CREATININE 10.22* 7.87* 9.61* 8.52* 7.92*  CALCIUM 9.5 9.3 9.9 9.6 10.0  MG  --  2.0 2.2 2.0 1.9  PHOS  --   --  7.6*  --   --      Liver Function Tests: Recent Labs  Lab 08/08/21 0816  AST 16  ALT 13  ALKPHOS 79  BILITOT 1.1  PROT 6.9  ALBUMIN 2.6*    No results for input(s): LIPASE, AMYLASE in the last 168 hours. No results for input(s): AMMONIA in the last 168 hours.  CBC: Recent Labs  Lab 08/08/21 0816 08/10/21 0347 08/11/21 0226 08/12/21 0613 08/13/21 0827  WBC 9.3 8.8 8.5 9.6 8.8  NEUTROABS 7.6  --   --   --   --   HGB 9.6* 10.0* 9.9* 9.2* 8.4*  HCT 30.3* 31.9* 31.1* 30.2* 26.9*  MCV 95.3 93.3 96.0 96.2 97.8  PLT 136* 150 145* 143* 138*     Cardiac Enzymes: No results for input(s): CKTOTAL, CKMB, CKMBINDEX, TROPONINI in the last 168 hours.  BNP: Invalid input(s): POCBNP  CBG: No results for input(s): GLUCAP in the last 168 hours.  Microbiology: Results for orders placed or performed during the hospital encounter of 08/08/21  Resp Panel by RT-PCR (Flu A&B, Covid) Nasopharyngeal Swab     Status: None   Collection Time: 08/08/21  1:22 PM   Specimen: Nasopharyngeal Swab; Nasopharyngeal(NP) swabs in vial transport medium  Result Value Ref Range Status   SARS Coronavirus 2 by RT PCR NEGATIVE NEGATIVE Final    Comment: (NOTE) SARS-CoV-2 target nucleic acids are NOT DETECTED.  The SARS-CoV-2 RNA is generally detectable in upper respiratory specimens during the acute phase of infection. The lowest concentration of SARS-CoV-2 viral copies this assay can detect is 138 copies/mL. A negative result does not preclude SARS-Cov-2 infection and should not be used as the sole basis for treatment or other patient management decisions. A negative result may occur with  improper specimen collection/handling, submission of specimen other than  nasopharyngeal swab, presence of viral mutation(s) within the areas targeted by this assay, and inadequate number of viral copies(<138 copies/mL). A negative result must be combined with clinical observations, patient history, and epidemiological information. The expected result is Negative.  Fact Sheet for Patients:  EntrepreneurPulse.com.au  Fact Sheet for Healthcare Providers:  IncredibleEmployment.be  This test is no t yet approved or cleared by the [place] FDA and  has been authorized for detection and/or diagnosis of SARS-CoV-2 by FDA under an Emergency Use Authorization (EUA). This EUA will remain  in effect (meaning this test can be used) for the duration of the COVID-19 declaration under Section 564(b)(1) of the Act, 21 U.S.C.section 360bbb-3(b)(1), unless the authorization is terminated  or revoked sooner.       Influenza A by PCR NEGATIVE NEGATIVE Final   Influenza B by PCR NEGATIVE NEGATIVE Final    Comment: (NOTE) The Xpert Xpress SARS-CoV-2/FLU/RSV plus assay is intended as an aid in the diagnosis of influenza from Nasopharyngeal swab specimens and should not be used as a sole basis for treatment. Nasal washings and aspirates are unacceptable for Xpert Xpress SARS-CoV-2/FLU/RSV testing.  Fact Sheet for Patients: EntrepreneurPulse.com.au  Fact Sheet for Healthcare Providers: IncredibleEmployment.be  This test is not yet approved or cleared by the [place] FDA and has been authorized for detection and/or diagnosis of SARS-CoV-2 by FDA under an Emergency Use Authorization (EUA). This EUA will remain in effect (meaning this test can be used) for the duration of the COVID-19 declaration under Section 564(b)(1) of the Act, 21 U.S.C. section 360bbb-3(b)(1), unless the authorization is terminated or revoked.  Performed at [facility], [address] [place], [place]  [address]   Culture, blood (Routine X 2) w Reflex to ID Panel     Status: None (Preliminary result)   Collection Time: 08/11/21  5:47 PM   Specimen: BLOOD  Result Value Ref Range Status   Specimen Description   Final    BLOOD LEFT HAND Performed at [facility], [address], [place], [place] [address]    Special Requests   Final    BOTTLES DRAWN AEROBIC AND ANAEROBIC Blood Culture adequate volume Performed at [facility], [address] [place], [place] [address]    Culture  Setup Time   Final    GRAM POSITIVE COCCI IN CHAINS IN BOTH AEROBIC AND ANAEROBIC BOTTLES Gram Stain Report Called to,Read Back By and Verified With: JASON ROBBINS 08/12/21 @ 0507 BY SB Performed at [facility], [address], [place], [place] [address]    Culture   Final    GRAM POSITIVE COCCI IN CHAINS CULTURE REINCUBATED FOR BETTER GROWTH Performed at [facility], [place] [address], [place],  [address]    Report Status PENDING  Incomplete  Culture, blood (Routine X 2) w Reflex  to ID Panel     Status: Abnormal (Preliminary result)   Collection Time: 08/11/21  5:47 PM   Specimen: BLOOD  Result Value Ref Range Status   Specimen Description   Final    BLOOD FOA Performed at [facility], [address] [place], [place] [address]    Special Requests   Final    BOTTLES DRAWN AEROBIC AND ANAEROBIC Blood Culture adequate volume Performed at [facility], [address] [place], [place] [address]    Culture  Setup Time   Final    GRAM POSITIVE COCCI IN CHAINS IN BOTH AEROBIC AND ANAEROBIC BOTTLES Gram Stain Report Called to,Read Back By and Verified With: JASON ROBBINS 08/12/21 @ 0507 BY SB    Culture (A)  Final    ENTEROCOCCUS FAECALIS SUSCEPTIBILITIES TO FOLLOW Performed at [facility], [place] [address], [place], [place] [address]    Report Status PENDING  Incomplete  Blood Culture ID Panel (Reflexed)     Status: Abnormal   Collection  Time: 08/11/21  5:47 PM  Result Value Ref Range Status   Enterococcus faecalis DETECTED (A) NOT DETECTED Final    Comment: CRITICAL RESULT CALLED TO, READ BACK BY AND VERIFIED WITH: JASON ROBBINS 08/12/21 @ 0507 BY SB    Enterococcus Faecium NOT DETECTED NOT DETECTED Final   Listeria monocytogenes NOT DETECTED NOT DETECTED Final   Staphylococcus species NOT DETECTED NOT DETECTED Final   Staphylococcus aureus (BCID) NOT DETECTED NOT DETECTED Final   Staphylococcus epidermidis NOT DETECTED NOT DETECTED Final   Staphylococcus lugdunensis NOT DETECTED NOT DETECTED Final   Streptococcus species NOT DETECTED NOT DETECTED Final   Streptococcus agalactiae NOT DETECTED NOT DETECTED Final   Streptococcus pneumoniae NOT DETECTED NOT DETECTED Final   Streptococcus pyogenes NOT DETECTED NOT DETECTED Final   A.calcoaceticus-baumannii NOT DETECTED NOT DETECTED Final   Bacteroides fragilis NOT DETECTED NOT DETECTED Final   Enterobacterales NOT DETECTED NOT DETECTED Final   Enterobacter cloacae complex NOT DETECTED NOT DETECTED Final   Escherichia coli NOT DETECTED NOT DETECTED Final   Klebsiella aerogenes NOT DETECTED NOT DETECTED Final   Klebsiella oxytoca NOT DETECTED NOT DETECTED Final   Klebsiella pneumoniae NOT DETECTED NOT DETECTED Final   Proteus species NOT DETECTED NOT DETECTED Final   Salmonella species NOT DETECTED NOT DETECTED Final   Serratia marcescens NOT DETECTED NOT DETECTED Final   Haemophilus influenzae NOT DETECTED NOT DETECTED Final   Neisseria meningitidis NOT DETECTED NOT DETECTED Final   Pseudomonas aeruginosa NOT DETECTED NOT DETECTED Final   Stenotrophomonas maltophilia NOT DETECTED NOT DETECTED Final   Candida albicans NOT DETECTED NOT DETECTED Final   Candida auris NOT DETECTED NOT DETECTED Final   Candida glabrata NOT DETECTED NOT DETECTED Final   Candida krusei NOT DETECTED NOT DETECTED Final   Candida parapsilosis NOT DETECTED NOT DETECTED Final   Candida tropicalis  NOT DETECTED NOT DETECTED Final   Cryptococcus neoformans/gattii NOT DETECTED NOT DETECTED Final   Vancomycin resistance NOT DETECTED NOT DETECTED Final    Comment: Performed at [facility], [address] [place], [place] [address]    Coagulation Studies: No results for input(s): LABPROT, INR in the last 72 hours.  Urinalysis: No results for input(s): COLORURINE, LABSPEC, PHURINE, GLUCOSEU, HGBUR, BILIRUBINUR, KETONESUR, PROTEINUR, UROBILINOGEN, NITRITE, LEUKOCYTESUR in the last 72 hours.  Invalid input(s): APPERANCEUR    Imaging: DG Chest 1 View  Result Date: 08/11/2021 CLINICAL DATA:  Kidney damage. End-stage renal disease on hemodialysis. EXAM: CHEST  1 VIEW COMPARISON:  04/16/2015 and 08/18/2013 radiographs.  CT 03/06/2013. FINDINGS: 1352 hours. Two views obtained. Mild patient rotation to the right. Right IJ hemodialysis catheters project to the level of the inferior right atrium. There is progressive cardiac enlargement. There is chronic volume loss and pleural thickening in the right hemithorax which appears similar to the prior studies allowing for the rotation. The left lung is clear. There is no pneumothorax or significant pleural effusion. The bones appear unchanged. IMPRESSION: No evidence of acute cardiopulmonary process. Stable chronic volume loss and pleuroparenchymal scarring in the right hemithorax. Electronically Signed   By: Richardean Sale M.D.   On: 08/11/2021 16:01   MR CERVICAL SPINE WO CONTRAST  Result Date: 08/12/2021 CLINICAL DATA:  Neck pain, acute.  Infection suspected.  Sepsis. EXAM: MRI CERVICAL AND THORACIC SPINE WITHOUT CONTRAST TECHNIQUE: Multiplanar and multiecho pulse sequences of the cervical spine, to include the craniocervical junction and cervicothoracic junction, and the thoracic spine, were obtained without intravenous contrast. COMPARISON:  Lumbar spine CT 08/08/2021. FINDINGS: MRI CERVICAL SPINE FINDINGS Alignment: Unremarkable Vertebrae:  Fatty marrow conversion at C5-6. No visible marrow inflammation, acute fracture, or aggressive bone lesion. Cord: No visible swelling or edema. No evidence of spinal collection. Posterior Fossa, vertebral arteries, paraspinal tissues: Mild prevertebral/retropharyngeal edema without visible source. Disc levels: Focal disc degeneration at C6-7 with chronic inferior endplate fracture or Schmorl's node at C5. Mild or moderate left foraminal narrowing at this level from disc bulging and uncovertebral ridging. MRI THORACIC SPINE FINDINGS Alignment:  Unremarkable Vertebrae: No visible marrow edema, fracture, or discitis. L2 inferior endplate Schmorl's node fatty marrow conversion. Diffusely low marrow signal. Cord: No visible intrathecal collection. There are pleural effusions bilaterally. Paraspinal and other soft tissues: Prominent paravertebral T2 hyperintensity which is presumably fat that extends into the bilateral subpleural space. Disc levels: No visible herniation or impingement. IMPRESSION: 1. Mild prevertebral/retropharyngeal edema without visible underlying osteomyelitis or septic arthritis. If persistent/progressive symptoms please have low threshold for follow-up given the suboptimal study quality. 2. Generally abnormal marrow signal needing correlation with CBC. Electronically Signed   By: Monte Fantasia M.D.   On: 08/12/2021 09:00   MR THORACIC SPINE WO CONTRAST  Result Date: 08/12/2021 CLINICAL DATA:  Neck pain, acute.  Infection suspected.  Sepsis. EXAM: MRI CERVICAL AND THORACIC SPINE WITHOUT CONTRAST TECHNIQUE: Multiplanar and multiecho pulse sequences of the cervical spine, to include the craniocervical junction and cervicothoracic junction, and the thoracic spine, were obtained without intravenous contrast. COMPARISON:  Lumbar spine CT 08/08/2021. FINDINGS: MRI CERVICAL SPINE FINDINGS Alignment: Unremarkable Vertebrae: Fatty marrow conversion at C5-6. No visible marrow inflammation, acute fracture,  or aggressive bone lesion. Cord: No visible swelling or edema. No evidence of spinal collection. Posterior Fossa, vertebral arteries, paraspinal tissues: Mild prevertebral/retropharyngeal edema without visible source. Disc levels: Focal disc degeneration at C6-7 with chronic inferior endplate fracture or Schmorl's node at C5. Mild or moderate left foraminal narrowing at this level from disc bulging and uncovertebral ridging. MRI THORACIC SPINE FINDINGS Alignment:  Unremarkable Vertebrae: No visible marrow edema, fracture, or discitis. L2 inferior endplate Schmorl's node fatty marrow conversion. Diffusely low marrow signal. Cord: No visible intrathecal collection. There are pleural effusions bilaterally. Paraspinal and other soft tissues: Prominent paravertebral T2 hyperintensity which is presumably fat that extends into the bilateral subpleural space. Disc levels: No visible herniation or impingement. IMPRESSION: 1. Mild prevertebral/retropharyngeal edema without visible underlying osteomyelitis or septic arthritis. If persistent/progressive symptoms please have low threshold for follow-up given the suboptimal study quality.  2. Generally abnormal marrow signal needing correlation with CBC. Electronically Signed   By: Monte Fantasia M.D.   On: 08/12/2021 09:00     Medications:    ampicillin (OMNIPEN) IV 2 g (08/13/21 0535)   cefTRIAXone (ROCEPHIN)  IV 2 g (08/12/21 2134)   sodium chloride     sodium chloride       apixaban  2.5 mg Oral BID   calcium acetate  2,668 mg Oral TID WC   Chlorhexidine Gluconate Cloth  6 each Topical Q0600   cyclobenzaprine  10 mg Oral QHS   epoetin (EPOGEN/PROCRIT) injection  4,000 Units Intravenous Q M,W,F-HD   gabapentin  100 mg Oral TID   lidocaine  2 patch Transdermal Q24H   nicotine  21 mg Transdermal Daily   acetaminophen, hydrALAZINE, Muscle Rub, ondansetron (ZOFRAN) IV, oxyCODONE-acetaminophen  Assessment/ Plan:  [name] [name] is a 60 y.o.  male with  medical conditions including anemia, CHF, hypertension, and ESRD on dialysis. Patient has presented to ED with progressive back pain.  [place] [facility]/MWF/Rt AVG  1. End stage renal disease on dialysis Will maintain outpatient schedule if possible Received dialysis yesterday due to planned MRI with contrast, which was rescheduled to last night. Dialysis terminated with 40min left in treatment due to increased confusion and agitation.  Next treatment scheduled for Monday  Rehab needed at discharge, bed accepted in [place]. Dialysis coordinator notified and will make outpatient arrangements accordingly.   2. Anemia of chronic kidney disease Lab Results  Component Value Date   HGB 8.4 (L) 08/13/2021  Hgb below target, low dose EPO ordered with treatment  3. Secondary Hyperparathyroidism:  Lab Results  Component Value Date   PTH 583.2 (H) 12/11/2012   CALCIUM 10.0 08/13/2021   CAION 1.27 (H) 12/26/2012   PHOS 7.6 (H) 08/11/2021  Calcium at goal, phosphorus elevated Calcium acetate with meals    4. Back Pain From [facility] Pain management MRI of cervical, and thoracic spine without contrast to evaluate possible spinal abscess.infection.  Cervical and thoracic MRI shows mild prevertebral/retropharyngeal edema without visible osteomyelitis or septic arthritis. MRI lumbar spine attempted last night, patient agitated and uncooperative. Test abandoned and will be rescheduled  5. LE edema Improved with dialysis, no edema   LOS: 4   9/4/202210:17 AM

## 2021-08-13 NOTE — Progress Notes (Signed)
Patient was scheduled for a MRI tonight. I was unable to give him any strong pain medication due to his BP. Patient went down to MRI and returned confused and agitated. He is oriented to self and place, but disoriented to situation. He was yelling that he needs to pick up his son, but told the aide that his son passed away.  He is obviously in pain, but refusing pain medication. States that he just wants to leave. He even called 911. He requires O2, but continues to remove it. Dr. Myna Hidalgo notified.

## 2021-08-14 DIAGNOSIS — M4646 Discitis, unspecified, lumbar region: Secondary | ICD-10-CM

## 2021-08-14 DIAGNOSIS — R7881 Bacteremia: Secondary | ICD-10-CM | POA: Diagnosis not present

## 2021-08-14 DIAGNOSIS — B952 Enterococcus as the cause of diseases classified elsewhere: Secondary | ICD-10-CM | POA: Diagnosis not present

## 2021-08-14 DIAGNOSIS — M5440 Lumbago with sciatica, unspecified side: Secondary | ICD-10-CM | POA: Diagnosis not present

## 2021-08-14 LAB — BASIC METABOLIC PANEL
Anion gap: 11 (ref 5–15)
BUN: 65 mg/dL — ABNORMAL HIGH (ref 6–20)
CO2: 28 mmol/L (ref 22–32)
Calcium: 10 mg/dL (ref 8.9–10.3)
Chloride: 96 mmol/L — ABNORMAL LOW (ref 98–111)
Creatinine, Ser: 9.28 mg/dL — ABNORMAL HIGH (ref 0.61–1.24)
GFR, Estimated: 6 mL/min — ABNORMAL LOW (ref >60)
Glucose, Bld: 111 mg/dL — ABNORMAL HIGH (ref 70–99)
Potassium: 4.8 mmol/L (ref 3.5–5.1)
Sodium: 135 mmol/L (ref 135–145)

## 2021-08-14 LAB — CBC
HCT: 28.7 % — ABNORMAL LOW (ref 39.0–52.0)
Hemoglobin: 8.6 g/dL — ABNORMAL LOW (ref 13.0–17.0)
MCH: 28.8 pg (ref 26.0–34.0)
MCHC: 30 g/dL (ref 30.0–36.0)
MCV: 96 fL (ref 80.0–100.0)
Platelets: 180 10*3/uL (ref 150–400)
RBC: 2.99 MIL/uL — ABNORMAL LOW (ref 4.22–5.81)
RDW: 16.9 % — ABNORMAL HIGH (ref 11.5–15.5)
WBC: 8.2 10*3/uL (ref 4.0–10.5)
nRBC: 0 % (ref 0.0–0.2)

## 2021-08-14 LAB — CULTURE, BLOOD (ROUTINE X 2)
Special Requests: ADEQUATE
Special Requests: ADEQUATE

## 2021-08-14 LAB — GLUCOSE, CAPILLARY
Glucose-Capillary: 93 mg/dL (ref 70–99)
Glucose-Capillary: 346 mg/dL — ABNORMAL HIGH (ref 70–99)

## 2021-08-14 LAB — MAGNESIUM: Magnesium: 2 mg/dL (ref 1.7–2.4)

## 2021-08-14 MED ORDER — SODIUM CHLORIDE 0.9 % IV SOLN
2.0000 g | Freq: Two times a day (BID) | INTRAVENOUS | Status: DC
  Administered 2021-08-14 – 2021-08-30 (×31): 2 g via INTRAVENOUS
  Filled 2021-08-14 (×6): qty 20
  Filled 2021-08-14: qty 2
  Filled 2021-08-14: qty 20
  Filled 2021-08-14 (×2): qty 2
  Filled 2021-08-14 (×5): qty 20
  Filled 2021-08-14: qty 2
  Filled 2021-08-14 (×6): qty 20
  Filled 2021-08-14 (×2): qty 2
  Filled 2021-08-14 (×3): qty 20
  Filled 2021-08-14 (×3): qty 2
  Filled 2021-08-14 (×7): qty 20
  Filled 2021-08-14: qty 2
  Filled 2021-08-14: qty 20

## 2021-08-14 MED ORDER — NEPRO/CARBSTEADY PO LIQD
237.0000 mL | Freq: Three times a day (TID) | ORAL | Status: DC
  Administered 2021-08-14 – 2021-08-28 (×20): 237 mL via ORAL

## 2021-08-14 NOTE — Progress Notes (Signed)
[facility]  ROUNDING NOTE   Subjective:   STANISLAUS [name] is a 60 y.o. male with medical conditions including anemia, CHF, hypertension, and ESRD on dialysis. Patient has presented to ED with progressive back pain.   Patient states he has always had back pain but it has increased in the past few days. Denies known precipitating factors. Chest X-ray of lumbar spine shows multilevel degenerative changes and stenosis, greatest at L4-L5.   Patient is known to our practice and receives dialysis at [facility] [place] supervised by Dr Juleen China. We have been consulted to manage dialysis treatments.   -Patient seen and evaluated during hemodialysis treatment today.  Appears to be tolerating well.    HEMODIALYSIS FLOWSHEET:  Blood Flow Rate (mL/min): 400 mL/min Arterial Pressure (mmHg): -160 mmHg Venous Pressure (mmHg): 220 mmHg Transmembrane Pressure (mmHg): 70 mmHg Ultrafiltration Rate (mL/min): 670 mL/min Dialysate Flow Rate (mL/min): 500 ml/min Conductivity: Machine : 13.7 Conductivity: Machine : 13.7 Dialysis Fluid Bolus: Normal Saline Bolus Amount (mL): 200 mL    Objective:  Vital signs in last 24 hours:  Temp:  [97.7 F (36.5 C)-102.6 F (39.2 C)] 97.7 F (36.5 C) (09/05 0745) Pulse Rate:  [87-112] 91 (09/05 1200) Resp:  [0-18] 0 (09/05 1200) BP: (103-124)/(49-69) 105/54 (09/05 1200) SpO2:  [99 %-100 %] 100 % (09/05 1200)  Weight change:  Filed Weights   08/08/21 0625  Weight: 91 kg    Intake/Output: I/O last 3 completed shifts: In: 100 [IV Piggyback:100] Out: -    Intake/Output this shift:  No intake/output data recorded.  Physical Exam: General: NAD, resting in bed  Head: Normocephalic, atraumatic. Moist oral mucosal membranes  Eyes: Anicteric  Lungs:  Clear to auscultation, normal effort  Heart: Regular rate and rhythm  Abdomen:  Soft, nontender, distended  Extremities: Mild right upper extremity swelling  Neurologic: Alert, moving all four  extremities  Skin: No lesions  Access: Rt AVG, swelling noted in right upper extremity    Basic Metabolic Panel: Recent Labs  Lab 08/10/21 0347 08/11/21 0226 08/12/21 0613 08/13/21 0827 08/14/21 0614  NA 137 136 138 136 135  K 4.4 5.0 4.9 4.6 4.8  CL 99 99 98 97* 96*  CO2 29 28 28 29 28   GLUCOSE 83 89 94 90 111*  BUN 47* 65* 56* 55* 65*  CREATININE 7.87* 9.61* 8.52* 7.92* 9.28*  CALCIUM 9.3 9.9 9.6 10.0 10.0  MG 2.0 2.2 2.0 1.9 2.0  PHOS  --  7.6*  --   --   --      Liver Function Tests: Recent Labs  Lab 08/08/21 0816  AST 16  ALT 13  ALKPHOS 79  BILITOT 1.1  PROT 6.9  ALBUMIN 2.6*    No results for input(s): LIPASE, AMYLASE in the last 168 hours. No results for input(s): AMMONIA in the last 168 hours.  CBC: Recent Labs  Lab 08/08/21 0816 08/10/21 0347 08/11/21 0226 08/12/21 0613 08/13/21 0827 08/14/21 0614  WBC 9.3 8.8 8.5 9.6 8.8 8.2  NEUTROABS 7.6  --   --   --   --   --   HGB 9.6* 10.0* 9.9* 9.2* 8.4* 8.6*  HCT 30.3* 31.9* 31.1* 30.2* 26.9* 28.7*  MCV 95.3 93.3 96.0 96.2 97.8 96.0  PLT 136* 150 145* 143* 138* 180     Cardiac Enzymes: No results for input(s): CKTOTAL, CKMB, CKMBINDEX, TROPONINI in the last 168 hours.  BNP: Invalid input(s): POCBNP  CBG: Recent Labs  Lab 08/14/21 0742  GLUCAP  50    Microbiology: Results for orders placed or performed during the hospital encounter of 08/08/21  Resp Panel by RT-PCR (Flu A&B, Covid) Nasopharyngeal Swab     Status: None   Collection Time: 08/08/21  1:22 PM   Specimen: Nasopharyngeal Swab; Nasopharyngeal(NP) swabs in vial transport medium  Result Value Ref Range Status   SARS Coronavirus 2 by RT PCR NEGATIVE NEGATIVE Final    Comment: (NOTE) SARS-CoV-2 target nucleic acids are NOT DETECTED.  The SARS-CoV-2 RNA is generally detectable in upper respiratory specimens during the acute phase of infection. The lowest concentration of SARS-CoV-2 viral copies this assay can detect is 138  copies/mL. A negative result does not preclude SARS-Cov-2 infection and should not be used as the sole basis for treatment or other patient management decisions. A negative result may occur with  improper specimen collection/handling, submission of specimen other than nasopharyngeal swab, presence of viral mutation(s) within the areas targeted by this assay, and inadequate number of viral copies(<138 copies/mL). A negative result must be combined with clinical observations, patient history, and epidemiological information. The expected result is Negative.  Fact Sheet for Patients:  EntrepreneurPulse.com.au  Fact Sheet for Healthcare Providers:  IncredibleEmployment.be  This test is no t yet approved or cleared by the [place] FDA and  has been authorized for detection and/or diagnosis of SARS-CoV-2 by FDA under an Emergency Use Authorization (EUA). This EUA will remain  in effect (meaning this test can be used) for the duration of the COVID-19 declaration under Section 564(b)(1) of the Act, 21 U.S.C.section 360bbb-3(b)(1), unless the authorization is terminated  or revoked sooner.       Influenza A by PCR NEGATIVE NEGATIVE Final   Influenza B by PCR NEGATIVE NEGATIVE Final    Comment: (NOTE) The Xpert Xpress SARS-CoV-2/FLU/RSV plus assay is intended as an aid in the diagnosis of influenza from Nasopharyngeal swab specimens and should not be used as a sole basis for treatment. Nasal washings and aspirates are unacceptable for Xpert Xpress SARS-CoV-2/FLU/RSV testing.  Fact Sheet for Patients: EntrepreneurPulse.com.au  Fact Sheet for Healthcare Providers: IncredibleEmployment.be  This test is not yet approved or cleared by the [place] FDA and has been authorized for detection and/or diagnosis of SARS-CoV-2 by FDA under an Emergency Use Authorization (EUA). This EUA will remain in effect (meaning  this test can be used) for the duration of the COVID-19 declaration under Section 564(b)(1) of the Act, 21 U.S.C. section 360bbb-3(b)(1), unless the authorization is terminated or revoked.  Performed at [facility], [address] [place], [place] [address]   Culture, blood (Routine X 2) w Reflex to ID Panel     Status: Abnormal   Collection Time: 08/11/21  5:47 PM   Specimen: BLOOD  Result Value Ref Range Status   Specimen Description   Final    BLOOD LEFT HAND Performed at [facility], [address] [place], [place] [address]    Special Requests   Final    BOTTLES DRAWN AEROBIC AND ANAEROBIC Blood Culture adequate volume Performed at [facility], [address] [place], [place] [address]    Culture  Setup Time   Final    GRAM POSITIVE COCCI IN CHAINS IN BOTH AEROBIC AND ANAEROBIC BOTTLES Gram Stain Report Called to,Read Back By and Verified With: JASON ROBBINS 08/12/21 @ 0507 BY SB Performed at [facility], [address] [place], [place] [address]    Culture (A)  Final    ENTEROCOCCUS FAECALIS  SUSCEPTIBILITIES PERFORMED ON PREVIOUS CULTURE WITHIN THE LAST 5 DAYS. Performed at [facility], [place] [address] [place], [place] [address]    Report Status 08/14/2021 FINAL  Final  Culture, blood (Routine X 2) w Reflex to ID Panel     Status: Abnormal   Collection Time: 08/11/21  5:47 PM   Specimen: BLOOD  Result Value Ref Range Status   Specimen Description   Final    BLOOD FOA Performed at [facility], [address], [place], [place] [address]    Special Requests   Final    BOTTLES DRAWN AEROBIC AND ANAEROBIC Blood Culture adequate volume Performed at [facility], [address] [place], [place] [address]    Culture  Setup Time   Final    GRAM POSITIVE COCCI IN CHAINS IN BOTH AEROBIC AND ANAEROBIC BOTTLES Gram Stain Report Called to,Read Back By and Verified With: JASON ROBBINS 08/12/21 @ 0507 BY  SB Performed at [facility], [place] [address], [place], [place] [address]    Culture ENTEROCOCCUS FAECALIS (A)  Final   Report Status 08/14/2021 FINAL  Final   Organism ID, Bacteria ENTEROCOCCUS FAECALIS  Final      Susceptibility   Enterococcus faecalis - MIC*    AMPICILLIN <=2 SENSITIVE Sensitive     VANCOMYCIN 2 SENSITIVE Sensitive     GENTAMICIN SYNERGY SENSITIVE Sensitive     * ENTEROCOCCUS FAECALIS  Blood Culture ID Panel (Reflexed)     Status: Abnormal   Collection Time: 08/11/21  5:47 PM  Result Value Ref Range Status   Enterococcus faecalis DETECTED (A) NOT DETECTED Final    Comment: CRITICAL RESULT CALLED TO, READ BACK BY AND VERIFIED WITH: JASON ROBBINS 08/12/21 @ 0507 BY SB    Enterococcus Faecium NOT DETECTED NOT DETECTED Final   Listeria monocytogenes NOT DETECTED NOT DETECTED Final   Staphylococcus species NOT DETECTED NOT DETECTED Final   Staphylococcus aureus (BCID) NOT DETECTED NOT DETECTED Final   Staphylococcus epidermidis NOT DETECTED NOT DETECTED Final   Staphylococcus lugdunensis NOT DETECTED NOT DETECTED Final   Streptococcus species NOT DETECTED NOT DETECTED Final   Streptococcus agalactiae NOT DETECTED NOT DETECTED Final   Streptococcus pneumoniae NOT DETECTED NOT DETECTED Final   Streptococcus pyogenes NOT DETECTED NOT DETECTED Final   A.calcoaceticus-baumannii NOT DETECTED NOT DETECTED Final   Bacteroides fragilis NOT DETECTED NOT DETECTED Final   Enterobacterales NOT DETECTED NOT DETECTED Final   Enterobacter cloacae complex NOT DETECTED NOT DETECTED Final   Escherichia coli NOT DETECTED NOT DETECTED Final   Klebsiella aerogenes NOT DETECTED NOT DETECTED Final   Klebsiella oxytoca NOT DETECTED NOT DETECTED Final   Klebsiella pneumoniae NOT DETECTED NOT DETECTED Final   Proteus species NOT DETECTED NOT DETECTED Final   Salmonella species NOT DETECTED NOT DETECTED Final   Serratia marcescens NOT DETECTED NOT DETECTED Final   Haemophilus influenzae  NOT DETECTED NOT DETECTED Final   Neisseria meningitidis NOT DETECTED NOT DETECTED Final   Pseudomonas aeruginosa NOT DETECTED NOT DETECTED Final   Stenotrophomonas maltophilia NOT DETECTED NOT DETECTED Final   Candida albicans NOT DETECTED NOT DETECTED Final   Candida auris NOT DETECTED NOT DETECTED Final   Candida glabrata NOT DETECTED NOT DETECTED Final   Candida krusei NOT DETECTED NOT DETECTED Final   Candida parapsilosis NOT DETECTED NOT DETECTED Final   Candida tropicalis NOT DETECTED NOT DETECTED Final   Cryptococcus neoformans/gattii NOT DETECTED NOT DETECTED Final   Vancomycin resistance NOT DETECTED NOT DETECTED  Final    Comment: Performed at [facility], [address] [place], [place] [address]  CULTURE, BLOOD (ROUTINE X 2) w Reflex to ID Panel     Status: None (Preliminary result)   Collection Time: 08/12/21 11:58 PM   Specimen: BLOOD  Result Value Ref Range Status   Specimen Description BLOOD BLOOD LEFT HAND  Final   Special Requests   Final    BOTTLES DRAWN AEROBIC AND ANAEROBIC Blood Culture adequate volume   Culture   Final    NO GROWTH 1 DAY Performed at [facility], [address], [place], [place] [address]    Report Status PENDING  Incomplete  CULTURE, BLOOD (ROUTINE X 2) w Reflex to ID Panel     Status: None (Preliminary result)   Collection Time: 08/12/21 11:58 PM   Specimen: BLOOD  Result Value Ref Range Status   Specimen Description BLOOD LEFT RADIAL  Final   Special Requests   Final    BOTTLES DRAWN AEROBIC AND ANAEROBIC Blood Culture results may not be optimal due to an inadequate volume of blood received in culture bottles   Culture   Final    NO GROWTH 1 DAY Performed at [facility], [address] [place], [place] [address]    Report Status PENDING  Incomplete    Coagulation Studies: No results for input(s): LABPROT, INR in the last 72 hours.  Urinalysis: No results for input(s): COLORURINE, LABSPEC, PHURINE,  GLUCOSEU, HGBUR, BILIRUBINUR, KETONESUR, PROTEINUR, UROBILINOGEN, NITRITE, LEUKOCYTESUR in the last 72 hours.  Invalid input(s): APPERANCEUR    Imaging: MR CERVICAL SPINE W WO CONTRAST  Result Date: 08/13/2021 CLINICAL DATA:  Initial evaluation for acute severe lower back pain, sepsis, concern for infection. EXAM: MRI CERVICAL, THORACIC AND LUMBAR SPINE WITHOUT AND WITH CONTRAST TECHNIQUE: Multiplanar and multiecho pulse sequences of the cervical spine, to include the craniocervical junction and cervicothoracic junction, and thoracic and lumbar spine, were obtained without and with intravenous contrast. CONTRAST:  19mL GADAVIST GADOBUTROL 1 MMOL/ML IV SOLN COMPARISON:  Comparison made with previous MRI from 08/12/2021 as well as prior CT from 08/08/2021. FINDINGS: MRI CERVICAL SPINE FINDINGS Alignment: Physiologic with preservation of the normal cervical lordosis. Vertebrae: Diffusely decreased T1 signal intensity seen throughout the visualized bone marrow, nonspecific, but most commonly related to anemia, smoking, or obesity. Fatty marrow conversion noted about the C5-6 interspace. No discrete or worrisome osseous lesions. There is increased STIR signal intensity with possible mild enhancement about the C5-6 interspace as compared to previous, suspicious for possible developing osteomyelitis no convincing evidence for acute discitis within the intervening C5-6 interspace at this time. No other abnormal marrow edema or enhancement to suggest acute infection elsewhere within the cervical spine. Cord: Normal signal and morphology. No epidural abscess or other collection. No abnormal enhancement. Posterior Fossa, vertebral arteries, paraspinal tissues: Prevertebral/retropharyngeal edema again seen extending from the skull base to the level of C5-6, slightly increased in prominence as compared to prior exam. No visible loculated soft tissue collection. Normal flow voids seen within the vertebral arteries  bilaterally. Disc levels: No other new or progressive finding as compared to recent MRI. No high-grade spinal stenosis. MRI THORACIC SPINE FINDINGS Alignment: Physiologic with preservation of the normal thoracic kyphosis. No listhesis. Vertebrae: Diffusely decreased T1 signal intensity seen throughout the visualized bone marrow. No discrete or worrisome osseous lesions. No abnormal marrow edema or enhancement to suggest osteomyelitis discitis or septic arthritis. Cord: Normal signal and morphology. No epidural abscess or other collection.  No abnormal enhancement. Paraspinal and other soft tissues: Prominent paravertebral T2 hyperintense soft tissue again seen, likely reflecting prominent paravertebral fat. Extension into the subpleural spaces bilaterally. Superimposed irregular left pleural effusion which appears partially loculated. Multiple prominent venous collaterals noted throughout the visualized subcutaneous fat. Disc levels: No significant disc pathology.  No stenosis or neural impingement. MRI LUMBAR SPINE FINDINGS Segmentation: Standard. Lowest well-formed disc space labeled the L5-S1 level. Alignment: Trace anterolisthesis of L4 on L5, chronic and facet mediated. Alignment otherwise normal with preservation of the normal lumbar lordosis. Vertebrae: Diffusely decreased T1 signal intensity seen throughout the visualized bone marrow. No discrete or worrisome osseous lesions. Endplate Schmorl's node deformities with associated fatty marrow conversion seen about the L2 vertebral body. There is fluid signal intensity within the L4-5 disc space without discernible enhancement, nonspecific, but could reflect early changes of acute discitis. No overt evidence for associated osteomyelitis within the adjacent vertebral bodies. Marrow edema and enhancement seen about the L4-5 facets bilaterally, favored to be degenerative in nature. No other abnormal marrow edema or enhancement. Conus medullaris and cauda equina:  Conus extends to the L1-2 level. Conus and cauda equina appear normal. No epidural abscess or other collection. No abnormal enhancement. Paraspinal and other soft tissues: There is abnormal edema and enhancement involving the left psoas muscle, extending from the level of L3 inferiorly (series 41, image 35). Findings concerning for acute infection. Superimposed 1.3 cm area of T2 signal intensity suspicious for phlegmon/early abscess formation (series 34, image 38). No other loculated soft tissue collections. Multiple prominent venous collaterals noted throughout the subcutaneous fat. 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 Disc levels: L1-2: Endplate Schmorl's node deformity without significant disc bulge. Mild facet hypertrophy. No stenosis. L2-3: Mild disc bulge with endplate Schmorl's node deformity. No spinal stenosis. Mild right L2 foraminal narrowing. L3-4: Mild disc bulge. Mild bilateral facet hypertrophy. No significant spinal stenosis. Mild right L3 foraminal narrowing. L4-5: Disc bulge with reactive endplate change. Nonspecific fluid signal intensity again noted within the L4-5 interspace. Moderate bilateral facet hypertrophy with associated reactive marrow edema and enhancement and small joint effusions. Resultant mild canal with mild to moderate left greater than right lateral recess stenosis. Mild right with moderate left L4 foraminal narrowing. L5-S1: Mild disc bulge with reactive endplate spurring. No significant spinal stenosis. Mild bilateral L5 foraminal narrowing. IMPRESSION: 1. Interval development of marrow edema about the C5-6 interspace since recent exam, concerning for possible developing osteomyelitis. Adjacent prevertebral/retropharyngeal edema has slightly increased from prior. 2. Abnormal inflammatory changes involving the left psoas muscle, concerning for acute infection with myositis. Superimposed 1.3 cm area of T2 signal intensity suspicious for  phlegmon/early abscess formation. Fluid signal intensity within the L4-5 interspace is nonspecific, but could reflect changes of early discitis, and possibly be the source of the left psoas infection. 3. Moderate facet degeneration at L4-5 with associated reactive marrow edema and enhancement. Findings are favored to be degenerative in nature, although attention at follow-up is recommended. 4. Irregular and partially loculated left pleural effusion. 5. Underlying diffusely abnormal marrow edema, stable from prior. Electronically Signed   By: Jeannine Boga M.D.   On: 08/13/2021 20:29   MR THORACIC SPINE W WO CONTRAST  Result Date: 08/13/2021 CLINICAL DATA:  Initial evaluation for acute severe lower back pain, sepsis, concern for infection. EXAM: MRI CERVICAL, THORACIC AND LUMBAR SPINE WITHOUT AND WITH CONTRAST TECHNIQUE: Multiplanar and multiecho pulse sequences of the cervical spine, to include the craniocervical junction and cervicothoracic junction, and thoracic  and lumbar spine, were obtained without and with intravenous contrast. CONTRAST:  7mL GADAVIST GADOBUTROL 1 MMOL/ML IV SOLN COMPARISON:  Comparison made with previous MRI from 08/12/2021 as well as prior CT from 08/08/2021. FINDINGS: MRI CERVICAL SPINE FINDINGS Alignment: Physiologic with preservation of the normal cervical lordosis. Vertebrae: Diffusely decreased T1 signal intensity seen throughout the visualized bone marrow, nonspecific, but most commonly related to anemia, smoking, or obesity. Fatty marrow conversion noted about the C5-6 interspace. No discrete or worrisome osseous lesions. There is increased STIR signal intensity with possible mild enhancement about the C5-6 interspace as compared to previous, suspicious for possible developing osteomyelitis no convincing evidence for acute discitis within the intervening C5-6 interspace at this time. No other abnormal marrow edema or enhancement to suggest acute infection elsewhere within  the cervical spine. Cord: Normal signal and morphology. No epidural abscess or other collection. No abnormal enhancement. Posterior Fossa, vertebral arteries, paraspinal tissues: Prevertebral/retropharyngeal edema again seen extending from the skull base to the level of C5-6, slightly increased in prominence as compared to prior exam. No visible loculated soft tissue collection. Normal flow voids seen within the vertebral arteries bilaterally. Disc levels: No other new or progressive finding as compared to recent MRI. No high-grade spinal stenosis. MRI THORACIC SPINE FINDINGS Alignment: Physiologic with preservation of the normal thoracic kyphosis. No listhesis. Vertebrae: Diffusely decreased T1 signal intensity seen throughout the visualized bone marrow. No discrete or worrisome osseous lesions. No abnormal marrow edema or enhancement to suggest osteomyelitis discitis or septic arthritis. Cord: Normal signal and morphology. No epidural abscess or other collection. No abnormal enhancement. Paraspinal and other soft tissues: Prominent paravertebral T2 hyperintense soft tissue again seen, likely reflecting prominent paravertebral fat. Extension into the subpleural spaces bilaterally. Superimposed irregular left pleural effusion which appears partially loculated. Multiple prominent venous collaterals noted throughout the visualized subcutaneous fat. Disc levels: No significant disc pathology.  No stenosis or neural impingement. MRI LUMBAR SPINE FINDINGS Segmentation: Standard. Lowest well-formed disc space labeled the L5-S1 level. Alignment: Trace anterolisthesis of L4 on L5, chronic and facet mediated. Alignment otherwise normal with preservation of the normal lumbar lordosis. Vertebrae: Diffusely decreased T1 signal intensity seen throughout the visualized bone marrow. No discrete or worrisome osseous lesions. Endplate Schmorl's node deformities with associated fatty marrow conversion seen about the L2 vertebral  body. There is fluid signal intensity within the L4-5 disc space without discernible enhancement, nonspecific, but could reflect early changes of acute discitis. No overt evidence for associated osteomyelitis within the adjacent vertebral bodies. Marrow edema and enhancement seen about the L4-5 facets bilaterally, favored to be degenerative in nature. No other abnormal marrow edema or enhancement. Conus medullaris and cauda equina: Conus extends to the L1-2 level. Conus and cauda equina appear normal. No epidural abscess or other collection. No abnormal enhancement. Paraspinal and other soft tissues: There is abnormal edema and enhancement involving the left psoas muscle, extending from the level of L3 inferiorly (series 41, image 35). Findings concerning for acute infection. Superimposed 1.3 cm area of T2 signal intensity suspicious for phlegmon/early abscess formation (series 34, image 38). No other loculated soft tissue collections. Multiple prominent venous collaterals noted throughout the subcutaneous fat. 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 Disc levels: L1-2: Endplate Schmorl's node deformity without significant disc bulge. Mild facet hypertrophy. No stenosis. L2-3: Mild disc bulge with endplate Schmorl's node deformity. No spinal stenosis. Mild right L2 foraminal narrowing. L3-4: Mild disc bulge. Mild bilateral facet hypertrophy. No significant spinal stenosis. Mild right L3  foraminal narrowing. L4-5: Disc bulge with reactive endplate change. Nonspecific fluid signal intensity again noted within the L4-5 interspace. Moderate bilateral facet hypertrophy with associated reactive marrow edema and enhancement and small joint effusions. Resultant mild canal with mild to moderate left greater than right lateral recess stenosis. Mild right with moderate left L4 foraminal narrowing. L5-S1: Mild disc bulge with reactive endplate spurring. No significant spinal stenosis. Mild  bilateral L5 foraminal narrowing. IMPRESSION: 1. Interval development of marrow edema about the C5-6 interspace since recent exam, concerning for possible developing osteomyelitis. Adjacent prevertebral/retropharyngeal edema has slightly increased from prior. 2. Abnormal inflammatory changes involving the left psoas muscle, concerning for acute infection with myositis. Superimposed 1.3 cm area of T2 signal intensity suspicious for phlegmon/early abscess formation. Fluid signal intensity within the L4-5 interspace is nonspecific, but could reflect changes of early discitis, and possibly be the source of the left psoas infection. 3. Moderate facet degeneration at L4-5 with associated reactive marrow edema and enhancement. Findings are favored to be degenerative in nature, although attention at follow-up is recommended. 4. Irregular and partially loculated left pleural effusion. 5. Underlying diffusely abnormal marrow edema, stable from prior. Electronically Signed   By: Jeannine Boga M.D.   On: 08/13/2021 20:29   MR Lumbar Spine W Wo Contrast  Result Date: 08/13/2021 CLINICAL DATA:  Initial evaluation for acute severe lower back pain, sepsis, concern for infection. EXAM: MRI CERVICAL, THORACIC AND LUMBAR SPINE WITHOUT AND WITH CONTRAST TECHNIQUE: Multiplanar and multiecho pulse sequences of the cervical spine, to include the craniocervical junction and cervicothoracic junction, and thoracic and lumbar spine, were obtained without and with intravenous contrast. CONTRAST:  53mL GADAVIST GADOBUTROL 1 MMOL/ML IV SOLN COMPARISON:  Comparison made with previous MRI from 08/12/2021 as well as prior CT from 08/08/2021. FINDINGS: MRI CERVICAL SPINE FINDINGS Alignment: Physiologic with preservation of the normal cervical lordosis. Vertebrae: Diffusely decreased T1 signal intensity seen throughout the visualized bone marrow, nonspecific, but most commonly related to anemia, smoking, or obesity. Fatty marrow conversion  noted about the C5-6 interspace. No discrete or worrisome osseous lesions. There is increased STIR signal intensity with possible mild enhancement about the C5-6 interspace as compared to previous, suspicious for possible developing osteomyelitis no convincing evidence for acute discitis within the intervening C5-6 interspace at this time. No other abnormal marrow edema or enhancement to suggest acute infection elsewhere within the cervical spine. Cord: Normal signal and morphology. No epidural abscess or other collection. No abnormal enhancement. Posterior Fossa, vertebral arteries, paraspinal tissues: Prevertebral/retropharyngeal edema again seen extending from the skull base to the level of C5-6, slightly increased in prominence as compared to prior exam. No visible loculated soft tissue collection. Normal flow voids seen within the vertebral arteries bilaterally. Disc levels: No other new or progressive finding as compared to recent MRI. No high-grade spinal stenosis. MRI THORACIC SPINE FINDINGS Alignment: Physiologic with preservation of the normal thoracic kyphosis. No listhesis. Vertebrae: Diffusely decreased T1 signal intensity seen throughout the visualized bone marrow. No discrete or worrisome osseous lesions. No abnormal marrow edema or enhancement to suggest osteomyelitis discitis or septic arthritis. Cord: Normal signal and morphology. No epidural abscess or other collection. No abnormal enhancement. Paraspinal and other soft tissues: Prominent paravertebral T2 hyperintense soft tissue again seen, likely reflecting prominent paravertebral fat. Extension into the subpleural spaces bilaterally. Superimposed irregular left pleural effusion which appears partially loculated. Multiple prominent venous collaterals noted throughout the visualized subcutaneous fat. Disc levels: No significant disc pathology.  No stenosis or neural impingement.  MRI LUMBAR SPINE FINDINGS Segmentation: Standard. Lowest well-formed  disc space labeled the L5-S1 level. Alignment: Trace anterolisthesis of L4 on L5, chronic and facet mediated. Alignment otherwise normal with preservation of the normal lumbar lordosis. Vertebrae: Diffusely decreased T1 signal intensity seen throughout the visualized bone marrow. No discrete or worrisome osseous lesions. Endplate Schmorl's node deformities with associated fatty marrow conversion seen about the L2 vertebral body. There is fluid signal intensity within the L4-5 disc space without discernible enhancement, nonspecific, but could reflect early changes of acute discitis. No overt evidence for associated osteomyelitis within the adjacent vertebral bodies. Marrow edema and enhancement seen about the L4-5 facets bilaterally, favored to be degenerative in nature. No other abnormal marrow edema or enhancement. Conus medullaris and cauda equina: Conus extends to the L1-2 level. Conus and cauda equina appear normal. No epidural abscess or other collection. No abnormal enhancement. Paraspinal and other soft tissues: There is abnormal edema and enhancement involving the left psoas muscle, extending from the level of L3 inferiorly (series 41, image 35). Findings concerning for acute infection. Superimposed 1.3 cm area of T2 signal intensity suspicious for phlegmon/early abscess formation (series 34, image 38). No other loculated soft tissue collections. Multiple prominent venous collaterals noted throughout the subcutaneous fat. 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 Disc levels: L1-2: Endplate Schmorl's node deformity without significant disc bulge. Mild facet hypertrophy. No stenosis. L2-3: Mild disc bulge with endplate Schmorl's node deformity. No spinal stenosis. Mild right L2 foraminal narrowing. L3-4: Mild disc bulge. Mild bilateral facet hypertrophy. No significant spinal stenosis. Mild right L3 foraminal narrowing. L4-5: Disc bulge with reactive endplate change. Nonspecific  fluid signal intensity again noted within the L4-5 interspace. Moderate bilateral facet hypertrophy with associated reactive marrow edema and enhancement and small joint effusions. Resultant mild canal with mild to moderate left greater than right lateral recess stenosis. Mild right with moderate left L4 foraminal narrowing. L5-S1: Mild disc bulge with reactive endplate spurring. No significant spinal stenosis. Mild bilateral L5 foraminal narrowing. IMPRESSION: 1. Interval development of marrow edema about the C5-6 interspace since recent exam, concerning for possible developing osteomyelitis. Adjacent prevertebral/retropharyngeal edema has slightly increased from prior. 2. Abnormal inflammatory changes involving the left psoas muscle, concerning for acute infection with myositis. Superimposed 1.3 cm area of T2 signal intensity suspicious for phlegmon/early abscess formation. Fluid signal intensity within the L4-5 interspace is nonspecific, but could reflect changes of early discitis, and possibly be the source of the left psoas infection. 3. Moderate facet degeneration at L4-5 with associated reactive marrow edema and enhancement. Findings are favored to be degenerative in nature, although attention at follow-up is recommended. 4. Irregular and partially loculated left pleural effusion. 5. Underlying diffusely abnormal marrow edema, stable from prior. Electronically Signed   By: Jeannine Boga M.D.   On: 08/13/2021 20:29   ECHOCARDIOGRAM COMPLETE  Result Date: 08/13/2021    ECHOCARDIOGRAM REPORT   Patient Name:   [name] [name] Date of Exam: 08/13/2021 Medical Rec #:  [id number]     Height:       73.0 in Accession #:    [id number]    Weight:       200.6 lb Date of Birth:  1961/08/12     BSA:          2.154 m Patient Age:    85 years      BP:           109/48 mmHg Patient Gender: M  HR:           95 bpm. Exam Location:  [facility] Procedure: 2D Echo, 3D Echo and Strain Analysis Indications:     Bacteremia  R78.81  History:         Patient has no prior history of Echocardiogram examinations.  Sonographer:     Kathlen Brunswick RDCS Referring Phys:  [id number] Otila Kluver LAI Diagnosing Phys: Kate Sable MD IMPRESSIONS  1. Left ventricular ejection fraction, by estimation, is 30 to 35%. The left ventricle has moderate to severely decreased function. The left ventricle demonstrates global hypokinesis. There is mild left ventricular hypertrophy. Left ventricular diastolic parameters are consistent with Grade I diastolic dysfunction (impaired relaxation). The average left ventricular global longitudinal strain is -6.4 %. The global longitudinal strain is abnormal.  2. Right ventricular systolic function is moderately reduced. The right ventricular size is moderately enlarged.  3. Right atrial size was mild to moderately dilated.  4. The mitral valve is degenerative. Mild mitral valve regurgitation.  5. Tricuspid valve regurgitation is mild to moderate.  6. The aortic valve is calcified. Aortic valve regurgitation is moderate. Mild to moderate aortic valve stenosis. Aortic valve area, by VTI measures 1.52 cm. Aortic valve mean gradient measures 20.2 mmHg.  7. The inferior vena cava is normal in size with <50% respiratory variability, suggesting right atrial pressure of 8 mmHg. FINDINGS  Left Ventricle: Left ventricular ejection fraction, by estimation, is 30 to 35%. The left ventricle has moderate to severely decreased function. The left ventricle demonstrates global hypokinesis. The average left ventricular global longitudinal strain is -6.4 %. The global longitudinal strain is abnormal. 3D left ventricular ejection fraction analysis performed but not reported based on interpreter judgement due to suboptimal quality. The left ventricular internal cavity size was normal in size. There  is mild left ventricular hypertrophy. Left ventricular diastolic parameters are consistent with Grade I diastolic dysfunction (impaired  relaxation). Right Ventricle: The right ventricular size is moderately enlarged. No increase in right ventricular wall thickness. Right ventricular systolic function is moderately reduced. Left Atrium: Left atrial size was normal in size. Right Atrium: Right atrial size was mild to moderately dilated. Pericardium: There is no evidence of pericardial effusion. Mitral Valve: The mitral valve is degenerative in appearance. Mild mitral valve regurgitation. Tricuspid Valve: The tricuspid valve is not well visualized. Tricuspid valve regurgitation is mild to moderate. Aortic Valve: The aortic valve is calcified. Aortic valve regurgitation is moderate. Aortic regurgitation PHT measures 227 msec. Mild to moderate aortic stenosis is present. Aortic valve mean gradient measures 20.2 mmHg. Aortic valve peak gradient measures 33.8 mmHg. Aortic valve area, by VTI measures 1.52 cm. Pulmonic Valve: The pulmonic valve was not well visualized. Pulmonic valve regurgitation is not visualized. Aorta: The aortic root is normal in size and structure. Venous: The inferior vena cava is normal in size with less than 50% respiratory variability, suggesting right atrial pressure of 8 mmHg. IAS/Shunts: No atrial level shunt detected by color flow Doppler.  LEFT VENTRICLE PLAX 2D LVIDd:         6.00 cm      Diastology LVIDs:         4.80 cm      LV e' medial:    5.77 cm/s LV PW:         1.30 cm      LV E/e' medial:  13.7 LV IVS:        1.20 cm      LV e' lateral:  7.18 cm/s LVOT diam:     2.20 cm      LV E/e' lateral: 11.0 LV SV:         82 LV SV Index:   38           2D Longitudinal Strain LVOT Area:     3.80 cm     2D Strain GLS Avg:     -6.4 %  LV Volumes (MOD) LV vol d, MOD A2C: 214.0 ml 3D Volume EF: LV vol d, MOD A4C: 242.0 ml 3D EF:        36 % LV vol s, MOD A2C: 140.5 ml LV EDV:       327 ml LV vol s, MOD A4C: 156.0 ml LV ESV:       209 ml LV SV MOD A2C:     73.5 ml  LV SV:        119 ml LV SV MOD A4C:     242.0 ml LV SV MOD BP:       83.8 ml RIGHT VENTRICLE RV Basal diam:  4.60 cm TAPSE (M-mode): 1.8 cm LEFT ATRIUM           Index       RIGHT ATRIUM           Index LA diam:      5.10 cm 2.37 cm/m  RA Area:     24.50 cm LA Vol (A2C): 67.2 ml 31.19 ml/m RA Volume:   89.40 ml  41.50 ml/m LA Vol (A4C): 40.2 ml 18.66 ml/m  AORTIC VALVE                    PULMONIC VALVE AV Area (Vmax):    1.64 cm     PV Vmax:       1.03 m/s AV Area (Vmean):   1.37 cm     PV Peak grad:  4.2 mmHg AV Area (VTI):     1.52 cm AV Vmax:           290.50 cm/s AV Vmean:          211.750 cm/s AV VTI:            0.539 m AV Peak Grad:      33.8 mmHg AV Mean Grad:      20.2 mmHg LVOT Vmax:         125.00 cm/s LVOT Vmean:        76.500 cm/s LVOT VTI:          0.216 m LVOT/AV VTI ratio: 0.40 AI PHT:            227 msec  AORTA Ao Root diam: 3.70 cm Ao Asc diam:  3.80 cm MITRAL VALVE                TRICUSPID VALVE MV Area (PHT): 5.02 cm     TV Peak grad:   32.1 mmHg MV Decel Time: 151 msec     TV Vmax:        2.84 m/s MV E velocity: 78.80 cm/s MV A velocity: 113.00 cm/s  SHUNTS MV E/A ratio:  0.70         Systemic VTI:  0.22 m                             Systemic Diam: 2.20 cm Kate Sable MD Electronically signed by Kate Sable MD Signature Date/Time: 08/13/2021/3:47:36 PM  Final      Medications:    ampicillin (OMNIPEN) IV 2 g (08/14/21 0526)   cefTRIAXone (ROCEPHIN)  IV 2 g (08/13/21 2110)   sodium chloride     sodium chloride       apixaban  2.5 mg Oral BID   calcium acetate  2,668 mg Oral TID WC   Chlorhexidine Gluconate Cloth  6 each Topical Q0600   epoetin (EPOGEN/PROCRIT) injection  4,000 Units Intravenous Q M,W,F-HD   feeding supplement (NEPRO CARB STEADY)  237 mL Oral TID BM   lidocaine  2 patch Transdermal Q24H   nicotine  21 mg Transdermal Daily   acetaminophen, hydrALAZINE, LORazepam, Muscle Rub, ondansetron (ZOFRAN) IV, oxyCODONE-acetaminophen  Assessment/ Plan:  [name] is a 60 y.o.  male with medical conditions  including anemia, CHF, hypertension, and ESRD on dialysis. Patient has presented to ED with progressive back pain.  [place] [facility]/MWF/Rt AVG  1. End stage renal disease on dialysis Patient seen and evaluated during hemodialysis treatment today.  Tolerating well.  We will plan to complete dialysis treatment today. MRI performed on [facility].   2. Anemia of chronic kidney disease Lab Results  Component Value Date   HGB 8.6 (L) 08/14/2021  Administer Epogen 4070 with dialysis treatments.  3. Secondary Hyperparathyroidism:  Lab Results  Component Value Date   PTH 583.2 (H) 12/11/2012   CALCIUM 10.0 08/14/2021   CAION 1.27 (H) 12/26/2012   PHOS 7.6 (H) 08/11/2021  Maintain the patient on calcium acetate with meals.  4. 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  Multiple abnormalities noted.  Consider ID consultation but defer to the primary team.  Also abnormality noted within the left psoas muscle.     LOS: 5 Isrrael Fluckiger 9/5/20221:36 PM

## 2021-08-14 NOTE — Progress Notes (Signed)
Patient completed dialysis treatment as ordered. 1.5 liters of fluid removed, patient tolerated well.

## 2021-08-14 NOTE — Progress Notes (Signed)
OT Cancellation Note  Patient Details Name: [name] [name] MRN: [id number] DOB: Jan 03, 1961   Cancelled Treatment:    Reason Eval/Treat Not Completed: Patient at procedure or test/ unavailable. Pt currently off the floor for HD. Will re-attempt OT at later time/date as pt is available.  Josiah Lobo, PhD, MS, OTR/L 08/14/21, 1:51 PM

## 2021-08-14 NOTE — Plan of Care (Signed)
Safety maintained. Patient spiked a temp of 102.6 during the night. Tylenol was given. MEWS score was red. MEWS has now improved to green. Temp is now 98.8. PRN percocet administered x 2 for pain. Patient states that pain still pretty intense. Still confused as to why he's here at the hospital, but he was much more pleasant tonight. IV abx administered.  Problem: Education: Goal: Knowledge of General Education information will improve Description: Including pain rating scale, medication(s)/side effects and non-pharmacologic comfort measures Outcome: Progressing   Problem: Health Behavior/Discharge Planning: Goal: Ability to manage health-related needs will improve Outcome: Progressing   Problem: Clinical Measurements: Goal: Ability to maintain clinical measurements within normal limits will improve Outcome: Progressing Goal: Will remain free from infection Outcome: Progressing Goal: Diagnostic test results will improve Outcome: Progressing Goal: Respiratory complications will improve Outcome: Progressing Goal: Cardiovascular complication will be avoided Outcome: Progressing   Problem: Activity: Goal: Risk for activity intolerance will decrease Outcome: Progressing   Problem: Nutrition: Goal: Adequate nutrition will be maintained Outcome: Progressing   Problem: Coping: Goal: Level of anxiety will decrease Outcome: Progressing   Problem: Elimination: Goal: Will not experience complications related to bowel motility Outcome: Progressing Goal: Will not experience complications related to urinary retention Outcome: Progressing   Problem: Pain Managment: Goal: General experience of comfort will improve Outcome: Progressing   Problem: Safety: Goal: Ability to remain free from injury will improve Outcome: Progressing   Problem: Skin Integrity: Goal: Risk for impaired skin integrity will decrease Outcome: Progressing

## 2021-08-14 NOTE — Progress Notes (Signed)
Rapid Response Event Note   Reason for Call :  Fever, (102.6), Tachycardia (112), red MEWS score of 4  Initial Focused Assessment:  Rechecked temperature it is now 100.9, pulse is 110, appears ill, but awake and alert and responding appropriately.      Interventions:  Ice packs applied to axillae, primary nurse is waiting for MD to review chart and place any orders.   Plan of Care:  Follow up with primary nurse regarding fever and pulse rate. Ensure that all orders are implemented when needed.    Event Summary:   MD Notified:  Call Time:2340 Arrival Time: [place]  Lance Bosch, RN

## 2021-08-14 NOTE — Progress Notes (Signed)
ID Pt has back pain but looks a little  comfortable in bed than 2 days ago Awake and alert Had dialysis  Patient Vitals for the past 24 hrs:  BP Temp Temp src Pulse Resp SpO2  08/14/21 1611 (!) 109/49 98.9 F (37.2 C) Oral 95 16 100 %  08/14/21 1453 (!) 121/50 -- -- 97 20 100 %  08/14/21 1430 -- 98.7 F (37.1 C) Oral (!) 54 12 94 %  08/14/21 1415 (!) 115/59 -- -- -- (!) 21 --  08/14/21 1348 (!) 89/62 -- -- -- 18 --  08/14/21 1345 -- -- -- -- 18 --  08/14/21 1330 (!) 94/47 -- -- -- 13 --  08/14/21 1315 (!) 86/46 -- -- -- 16 --  08/14/21 1300 105/67 -- -- -- 18 --  08/14/21 1245 117/72 -- -- 84 16 100 %  08/14/21 1230 (!) 110/54 -- -- -- 18 --  08/14/21 1215 (!) 120/54 -- -- -- 18 --  08/14/21 1200 (!) 105/54 -- -- 91 18 100 %  08/14/21 1145 (!) 109/53 -- -- 87 18 100 %  08/14/21 1130 105/63 -- -- 89 18 100 %  08/14/21 1115 103/69 -- -- 87 16 100 %  08/14/21 1100 (!) 124/53 -- -- 90 18 100 %  08/14/21 1046 -- -- -- -- 18 --  08/14/21 0745 (!) 109/59 97.7 F (36.5 C) Oral 92 18 100 %  08/14/21 0408 (!) 118/54 98.8 F (37.1 C) Oral 95 16 100 %  08/14/21 0208 (!) 109/49 98.6 F (37 C) Oral 97 16 100 %  08/14/21 0008 (!) 106/53 (!) 100.9 F (38.3 C) -- (!) 109 -- 100 %  08/13/21 2331 (!) 121/54 (!) 102.6 F (39.2 C) Oral (!) 112 16 100 %  08/13/21 1957 122/61 98.4 F (36.9 C) Oral (!) 102 18 99 %   Chest b/l air entry [id number]  Abd soft Rt AVG CNS grossly non focal   Labs CBC Latest Ref Rng & Units 08/14/2021 08/13/2021 08/12/2021  WBC 4.0 - 10.5 K/uL 8.2 8.8 9.6  Hemoglobin 13.0 - 17.0 g/dL 8.6(L) 8.4(L) 9.2(L)  Hematocrit 39.0 - 52.0 % 28.7(L) 26.9(L) 30.2(L)  Platelets 150 - 400 K/uL 180 138(L) 143(L)    CMP Latest Ref Rng & Units 08/14/2021 08/13/2021 08/12/2021  Glucose 70 - 99 mg/dL 111(H) 90 94  BUN 6 - 20 mg/dL 65(H) 55(H) 56(H)  Creatinine 0.61 - 1.24 mg/dL 9.28(H) 7.92(H) 8.52(H)  Sodium 135 - 145 mmol/L 135 136 138  Potassium 3.5 - 5.1 mmol/L 4.8 4.6 4.9  Chloride 98  - 111 mmol/L 96(L) 97(L) 98  CO2 22 - 32 mmol/L 28 29 28   Calcium 8.9 - 10.3 mg/dL 10.0 10.0 9.6  Total Protein 6.5 - 8.1 g/dL - - -  Total Bilirubin 0.3 - 1.2 mg/dL - - -  Alkaline Phos 38 - 126 U/L - - -  AST 15 - 41 U/L - - -  ALT 0 - 44 U/L - - -     Micro 08/11/21 BC- enterococcus fecalis  08/12/21 BC -   MRI of the spine with contrast Interval development of marrow edema about the C5-6 interspace since recent exam, concerning for possible developing osteomyelitis. Adjacent prevertebral/retropharyngeal edema has slightly increased from prior. 2. Abnormal inflammatory changes involving the left psoas muscle, concerning for acute infection with myositis. Superimposed 1.3 cm area of T2 signal intensity suspicious for phlegmon/early abscess formation. Fluid signal intensity within the L4-5 interspace is nonspecific, but could reflect changes  of early discitis, and possibly be the source of the left psoas infection.   Impression/recommendation  Enterococcus fecalis bacteremia with severe low back pain L4-L5 discitis and left psoas phlegmon  Repeat blood culture sent 2 d echo done Recommend TEE to r/o endocarditis as that would decide whether he needs dual antibiotic therapy. Currently on ampicillin and ceftriaxone  ESRD on dialysis  Discussed the management with the hospitalist and the patient

## 2021-08-14 NOTE — Progress Notes (Addendum)
PROGRESS NOTE    GERARDO [name]  JOA:[id number] DOB: 1961-01-27 DOA: 08/08/2021 PCP: Pcp, No  [id number]/[id number]-AA   Assessment & Plan:   Principal Problem:   Lower back pain Active Problems:   End-stage renal disease on hemodialysis ([place])   Polysubstance abuse ([place])   Tobacco abuse   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 (HCC)   Anemia in ESRD (end-stage renal disease) ([place])   HTN (hypertension)   Back pain   [name] [name] is a 60 y.o. male with medical history significant of ESRD-HD (MWF), hypertension, tobacco abuse, polysubstance abuse, diverticulitis, anemia, jugular vein thrombosis, GI bleeding, right pneumothorax, who presents with lower back pain.   Patient states that he has been having lower back pain for more than 1 week, which has been progressively worsening.  The pain is constant, sharp, 10 out of 10 in severity today, nonradiating.  The pain is so severe that he cannot walk.  On arrival he had to be lifted out of his vehicle by security and nursing staff.     Sepsis 2/2  # Enterococcus faecalis bacteremia --developed fever to 101.5 on 9/2, with tachycardia --in the setting of severe back pain, I was concerned about spinal or paraspinal infection.  MRI spine w contrast showed below. --started on empiric vanc and ceftriaxone and transitioned to IV amp --ID consulted --TTE no mention of endocarditis Plan: --cont IV ampicillin and ceftriaxone  Severe Lower back pain # Possible developing osteomyelitis in C5-6 # Left psoas muscle infection with superimposed 1.3 cm phlegmon/early abscess formation # Possible early discitis in L4-5 CT scan showed multilevel degenerative and canal and foraminal stenosis, greatest at L4-L5.    --back pain out of proportion to imaging finding  --MRI spine w contrast showed several infectious findings, likely seeded from Enterococcus bacteremia. Plan: --cont Percocet 5 mg q6h PRN (hold if systolic <601) --lidocaine  patches --treat infections  End-stage renal disease on hemodialysis (MWF) --cont phoslo --iHD per nephro --iHD today   Polysubstance abuse and tobacco abuse -Did counseling about importance of quitting substance use and tobacco use --cont nicotine patch   Anemia in ESRD (end-stage renal disease) ([place]):  --Hgb 8's --Epo with dialysis --transfuse if Hgb <7  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  --Eliquis changed from 5 mg daily to 2.5 mg bid per Dr. Keturah Barre recommendation. --cont Eliquis   HTN, not currently active Hypotension --BP has been intermittently low in 90's --hold amlodipine --small bolus if systolic <09'N.  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  Enterococcus bacteremia on IV abx   Subjective and Interval History:  Pt reported pain med helps with back pain.    Pt mentioned wanting to be transferred to [facility] if we were going to cut into him. I told him no surgery is planned currently, so pt said he would stay.   Objective: Vitals:   08/14/21 1348 08/14/21 1415 08/14/21 1430 08/14/21 1453  BP: (!) 89/62 (!) 115/59  (!) 121/50  Pulse:   (!) 54 97  Resp: 18 (!) 21 12 20   Temp:   98.7 F (37.1 C)   TempSrc:   Oral   SpO2:   94% 100%  Weight:  Height:        Intake/Output Summary (Last 24 hours) at 08/14/2021 1540 Last data filed at 08/14/2021 1348 Gross per 24 hour  Intake --  Output 1500 ml  Net -1500 ml    Filed Weights   08/08/21 0625  Weight: 91 kg    Examination:   Constitutional: NAD, lethargic but answering questions, receiving dialysis, appeared shaky HEENT: conjunctivae and lids normal, EOMI CV: No cyanosis.   RESP: normal respiratory effort SKIN:  warm, dry Abdomen:  dilated veins all over trunk and abdomen  Data Reviewed: I have personally reviewed following labs and imaging studies  CBC: Recent Labs  Lab 08/08/21 0816 08/10/21 0347 08/11/21 0226 08/12/21 0613 08/13/21 0827 08/14/21 0614  WBC 9.3 8.8 8.5 9.6 8.8 8.2  NEUTROABS 7.6  --   --   --   --   --   HGB 9.6* 10.0* 9.9* 9.2* 8.4* 8.6*  HCT 30.3* 31.9* 31.1* 30.2* 26.9* 28.7*  MCV 95.3 93.3 96.0 96.2 97.8 96.0  PLT 136* 150 145* 143* 138* 101   Basic Metabolic Panel: Recent Labs  Lab 08/10/21 0347 08/11/21 0226 08/12/21 0613 08/13/21 0827 08/14/21 0614  NA 137 136 138 136 135  K 4.4 5.0 4.9 4.6 4.8  CL 99 99 98 97* 96*  CO2 29 28 28 29 28   GLUCOSE 83 89 94 90 111*  BUN 47* 65* 56* 55* 65*  CREATININE 7.87* 9.61* 8.52* 7.92* 9.28*  CALCIUM 9.3 9.9 9.6 10.0 10.0  MG 2.0 2.2 2.0 1.9 2.0  PHOS  --  7.6*  --   --   --    GFR: Estimated Creatinine Clearance: 9.9 mL/min (A) (by C-G formula based on SCr of 9.28 mg/dL (H)). Liver Function Tests: Recent Labs  Lab 08/08/21 0816  AST 16  ALT 13  ALKPHOS 79  BILITOT 1.1  PROT 6.9  ALBUMIN 2.6*   No results for input(s): LIPASE, AMYLASE in the last 168 hours. No results for input(s): AMMONIA in the last 168 hours. Coagulation Profile: No results for input(s): INR, PROTIME in the last 168 hours. Cardiac Enzymes: No results for input(s): CKTOTAL, CKMB, CKMBINDEX, TROPONINI in the last 168 hours. BNP (last 3 results) No results for input(s): PROBNP in the last 8760 hours. HbA1C: No results for input(s): HGBA1C in the last 72 hours. CBG: Recent Labs  Lab 08/14/21 0742  GLUCAP 93   Lipid Profile: No results for input(s): CHOL, HDL, LDLCALC, TRIG, CHOLHDL, LDLDIRECT in the last 72 hours. Thyroid Function Tests: No results for input(s): TSH, T4TOTAL, FREET4, T3FREE, THYROIDAB in the last 72 hours. Anemia Panel: No results for input(s): VITAMINB12, FOLATE, FERRITIN, TIBC, IRON, RETICCTPCT in the last  72 hours. Sepsis Labs: Recent Labs  Lab 08/11/21 0226 08/11/21 1747 08/11/21 2002 08/12/21 0613 08/13/21 0827  PROCALCITON 5.82  --   --  5.15 4.61  LATICACIDVEN  --  0.9 1.1  --   --     Recent Results (from the past 240 hour(s))  Resp Panel by RT-PCR (Flu A&B, Covid) Nasopharyngeal Swab     Status: None   Collection Time: 08/08/21  1:22 PM   Specimen: Nasopharyngeal Swab; Nasopharyngeal(NP) swabs in vial transport medium  Result Value Ref Range Status   SARS Coronavirus 2 by RT PCR NEGATIVE NEGATIVE Final    Comment: (NOTE) SARS-CoV-2 target nucleic acids are NOT DETECTED.  The SARS-CoV-2 RNA is generally detectable in upper respiratory specimens during the acute phase of infection. The lowest concentration of  SARS-CoV-2 viral copies this assay can detect is 138 copies/mL. A negative result does not preclude SARS-Cov-2 infection and should not be used as the sole basis for treatment or other patient management decisions. A negative result may occur with  improper specimen collection/handling, submission of specimen other than nasopharyngeal swab, presence of viral mutation(s) within the areas targeted by this assay, and inadequate number of viral copies(<138 copies/mL). A negative result must be combined with clinical observations, patient history, and epidemiological information. The expected result is Negative.  Fact Sheet for Patients:  EntrepreneurPulse.com.au  Fact Sheet for Healthcare Providers:  IncredibleEmployment.be  This test is no t yet approved or cleared by the [place] FDA and  has been authorized for detection and/or diagnosis of SARS-CoV-2 by FDA under an Emergency Use Authorization (EUA). This EUA will remain  in effect (meaning this test can be used) for the duration of the COVID-19 declaration under Section 564(b)(1) of the Act, 21 U.S.C.section 360bbb-3(b)(1), unless the authorization is terminated  or  revoked sooner.       Influenza A by PCR NEGATIVE NEGATIVE Final   Influenza B by PCR NEGATIVE NEGATIVE Final    Comment: (NOTE) The Xpert Xpress SARS-CoV-2/FLU/RSV plus assay is intended as an aid in the diagnosis of influenza from Nasopharyngeal swab specimens and should not be used as a sole basis for treatment. Nasal washings and aspirates are unacceptable for Xpert Xpress SARS-CoV-2/FLU/RSV testing.  Fact Sheet for Patients: EntrepreneurPulse.com.au  Fact Sheet for Healthcare Providers: IncredibleEmployment.be  This test is not yet approved or cleared by the [place] FDA and has been authorized for detection and/or diagnosis of SARS-CoV-2 by FDA under an Emergency Use Authorization (EUA). This EUA will remain in effect (meaning this test can be used) for the duration of the COVID-19 declaration under Section 564(b)(1) of the Act, 21 U.S.C. section 360bbb-3(b)(1), unless the authorization is terminated or revoked.  Performed at [facility], [address] [place], [place] [address]   Culture, blood (Routine X 2) w Reflex to ID Panel     Status: Abnormal   Collection Time: 08/11/21  5:47 PM   Specimen: BLOOD  Result Value Ref Range Status   Specimen Description   Final    BLOOD LEFT HAND Performed at [facility], [address] [place], [place] [address]    Special Requests   Final    BOTTLES DRAWN AEROBIC AND ANAEROBIC Blood Culture adequate volume Performed at [facility], [address] [place], [place] [address]    Culture  Setup Time   Final    GRAM POSITIVE COCCI IN CHAINS IN BOTH AEROBIC AND ANAEROBIC BOTTLES Gram Stain Report Called to,Read Back By and Verified With: JASON ROBBINS 08/12/21 @ 0507 BY SB Performed at [facility], [address] [place], [place] [address]    Culture (A)  Final    ENTEROCOCCUS FAECALIS SUSCEPTIBILITIES PERFORMED ON PREVIOUS CULTURE WITHIN THE  LAST 5 DAYS. Performed at [facility], [place] [address], [place], [place] [address]    Report Status 08/14/2021 FINAL  Final  Culture, blood (Routine X 2) w Reflex to ID Panel     Status: Abnormal   Collection Time: 08/11/21  5:47 PM   Specimen: BLOOD  Result Value Ref Range Status   Specimen Description   Final    BLOOD FOA Performed at [facility], [address], [place],  [address]    Special Requests   Final  BOTTLES DRAWN AEROBIC AND ANAEROBIC Blood Culture adequate volume Performed at [facility], [address] [place], [place] [address]    Culture  Setup Time   Final    GRAM POSITIVE COCCI IN CHAINS IN BOTH AEROBIC AND ANAEROBIC BOTTLES Gram Stain Report Called to,Read Back By and Verified With: JASON ROBBINS 08/12/21 @ 0507 BY SB Performed at [facility], [place] [address], [place], [place] [address]    Culture ENTEROCOCCUS FAECALIS (A)  Final   Report Status 08/14/2021 FINAL  Final   Organism ID, Bacteria ENTEROCOCCUS FAECALIS  Final      Susceptibility   Enterococcus faecalis - MIC*    AMPICILLIN <=2 SENSITIVE Sensitive     VANCOMYCIN 2 SENSITIVE Sensitive     GENTAMICIN SYNERGY SENSITIVE Sensitive     * ENTEROCOCCUS FAECALIS  Blood Culture ID Panel (Reflexed)     Status: Abnormal   Collection Time: 08/11/21  5:47 PM  Result Value Ref Range Status   Enterococcus faecalis DETECTED (A) NOT DETECTED Final    Comment: CRITICAL RESULT CALLED TO, READ BACK BY AND VERIFIED WITH: JASON ROBBINS 08/12/21 @ 0507 BY SB    Enterococcus Faecium NOT DETECTED NOT DETECTED Final   Listeria monocytogenes NOT DETECTED NOT DETECTED Final   Staphylococcus species NOT DETECTED NOT DETECTED Final   Staphylococcus aureus (BCID) NOT DETECTED NOT DETECTED Final   Staphylococcus epidermidis NOT DETECTED NOT DETECTED Final   Staphylococcus lugdunensis NOT DETECTED NOT DETECTED Final   Streptococcus species NOT DETECTED NOT DETECTED Final    Streptococcus agalactiae NOT DETECTED NOT DETECTED Final   Streptococcus pneumoniae NOT DETECTED NOT DETECTED Final   Streptococcus pyogenes NOT DETECTED NOT DETECTED Final   A.calcoaceticus-baumannii NOT DETECTED NOT DETECTED Final   Bacteroides fragilis NOT DETECTED NOT DETECTED Final   Enterobacterales NOT DETECTED NOT DETECTED Final   Enterobacter cloacae complex NOT DETECTED NOT DETECTED Final   Escherichia coli NOT DETECTED NOT DETECTED Final   Klebsiella aerogenes NOT DETECTED NOT DETECTED Final   Klebsiella oxytoca NOT DETECTED NOT DETECTED Final   Klebsiella pneumoniae NOT DETECTED NOT DETECTED Final   Proteus species NOT DETECTED NOT DETECTED Final   Salmonella species NOT DETECTED NOT DETECTED Final   Serratia marcescens NOT DETECTED NOT DETECTED Final   Haemophilus influenzae NOT DETECTED NOT DETECTED Final   Neisseria meningitidis NOT DETECTED NOT DETECTED Final   Pseudomonas aeruginosa NOT DETECTED NOT DETECTED Final   Stenotrophomonas maltophilia NOT DETECTED NOT DETECTED Final   Candida albicans NOT DETECTED NOT DETECTED Final   Candida auris NOT DETECTED NOT DETECTED Final   Candida glabrata NOT DETECTED NOT DETECTED Final   Candida krusei NOT DETECTED NOT DETECTED Final   Candida parapsilosis NOT DETECTED NOT DETECTED Final   Candida tropicalis NOT DETECTED NOT DETECTED Final   Cryptococcus neoformans/gattii NOT DETECTED NOT DETECTED Final   Vancomycin resistance NOT DETECTED NOT DETECTED Final    Comment: Performed at [facility], [address] [place], [place] [address]  CULTURE, BLOOD (ROUTINE X 2) w Reflex to ID Panel     Status: None (Preliminary result)   Collection Time: 08/12/21 11:58 PM   Specimen: BLOOD  Result Value Ref Range Status   Specimen Description BLOOD BLOOD LEFT HAND  Final   Special Requests   Final    BOTTLES DRAWN AEROBIC AND ANAEROBIC Blood Culture adequate volume   Culture   Final    NO GROWTH 1 DAY Performed at [facility], [address] [place], [place] [address]    Report Status PENDING  Incomplete  CULTURE, BLOOD (ROUTINE X 2) w Reflex to ID Panel     Status: None (Preliminary result)   Collection Time: 08/12/21 11:58 PM   Specimen: BLOOD  Result Value Ref Range Status   Specimen Description BLOOD LEFT RADIAL  Final   Special Requests   Final    BOTTLES DRAWN AEROBIC AND ANAEROBIC Blood Culture results may not be optimal due to an inadequate volume of blood received in culture bottles   Culture   Final    NO GROWTH 1 DAY Performed at [facility], [address], [place], [place] [address]    Report Status PENDING  Incomplete      Radiology Studies: MR CERVICAL SPINE W WO CONTRAST  Result Date: 08/13/2021 CLINICAL DATA:  Initial evaluation for acute severe lower back pain, sepsis, concern for infection. EXAM: MRI CERVICAL, THORACIC AND LUMBAR SPINE WITHOUT AND WITH CONTRAST TECHNIQUE: Multiplanar and multiecho pulse sequences of the cervical spine, to include the craniocervical junction and cervicothoracic junction, and thoracic and lumbar spine, were obtained without and with intravenous contrast. CONTRAST:  1mL GADAVIST GADOBUTROL 1 MMOL/ML IV SOLN COMPARISON:  Comparison made with previous MRI from 08/12/2021 as well as prior CT from 08/08/2021. FINDINGS: MRI CERVICAL SPINE FINDINGS Alignment: Physiologic with preservation of the normal cervical lordosis. Vertebrae: Diffusely decreased T1 signal intensity seen throughout the visualized bone marrow, nonspecific, but most commonly related to anemia, smoking, or obesity. Fatty marrow conversion noted about the C5-6 interspace. No discrete or worrisome osseous lesions. There is increased STIR signal intensity with possible mild enhancement about the C5-6 interspace as compared to previous, suspicious for possible developing osteomyelitis no convincing evidence for acute discitis within the intervening C5-6 interspace at this time. No  other abnormal marrow edema or enhancement to suggest acute infection elsewhere within the cervical spine. Cord: Normal signal and morphology. No epidural abscess or other collection. No abnormal enhancement. Posterior Fossa, vertebral arteries, paraspinal tissues: Prevertebral/retropharyngeal edema again seen extending from the skull base to the level of C5-6, slightly increased in prominence as compared to prior exam. No visible loculated soft tissue collection. Normal flow voids seen within the vertebral arteries bilaterally. Disc levels: No other new or progressive finding as compared to recent MRI. No high-grade spinal stenosis. MRI THORACIC SPINE FINDINGS Alignment: Physiologic with preservation of the normal thoracic kyphosis. No listhesis. Vertebrae: Diffusely decreased T1 signal intensity seen throughout the visualized bone marrow. No discrete or worrisome osseous lesions. No abnormal marrow edema or enhancement to suggest osteomyelitis discitis or septic arthritis. Cord: Normal signal and morphology. No epidural abscess or other collection. No abnormal enhancement. Paraspinal and other soft tissues: Prominent paravertebral T2 hyperintense soft tissue again seen, likely reflecting prominent paravertebral fat. Extension into the subpleural spaces bilaterally. Superimposed irregular left pleural effusion which appears partially loculated. Multiple prominent venous collaterals noted throughout the visualized subcutaneous fat. Disc levels: No significant disc pathology.  No stenosis or neural impingement. MRI LUMBAR SPINE FINDINGS Segmentation: Standard. Lowest well-formed disc space labeled the L5-S1 level. Alignment: Trace anterolisthesis of L4 on L5, chronic and facet mediated. Alignment otherwise normal with preservation of the normal lumbar lordosis. Vertebrae: Diffusely decreased T1 signal intensity seen throughout the visualized bone marrow. No discrete or worrisome osseous lesions. Endplate Schmorl's  node deformities with associated fatty marrow conversion seen about the L2 vertebral body. There is fluid signal intensity within the L4-5 disc space without discernible enhancement, nonspecific, but  could reflect early changes of acute discitis. No overt evidence for associated osteomyelitis within the adjacent vertebral bodies. Marrow edema and enhancement seen about the L4-5 facets bilaterally, favored to be degenerative in nature. No other abnormal marrow edema or enhancement. Conus medullaris and cauda equina: Conus extends to the L1-2 level. Conus and cauda equina appear normal. No epidural abscess or other collection. No abnormal enhancement. Paraspinal and other soft tissues: There is abnormal edema and enhancement involving the left psoas muscle, extending from the level of L3 inferiorly (series 41, image 35). Findings concerning for acute infection. Superimposed 1.3 cm area of T2 signal intensity suspicious for phlegmon/early abscess formation (series 34, image 38). No other loculated soft tissue collections. Multiple prominent venous collaterals noted throughout the subcutaneous fat. 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 Disc levels: L1-2: Endplate Schmorl's node deformity without significant disc bulge. Mild facet hypertrophy. No stenosis. L2-3: Mild disc bulge with endplate Schmorl's node deformity. No spinal stenosis. Mild right L2 foraminal narrowing. L3-4: Mild disc bulge. Mild bilateral facet hypertrophy. No significant spinal stenosis. Mild right L3 foraminal narrowing. L4-5: Disc bulge with reactive endplate change. Nonspecific fluid signal intensity again noted within the L4-5 interspace. Moderate bilateral facet hypertrophy with associated reactive marrow edema and enhancement and small joint effusions. Resultant mild canal with mild to moderate left greater than right lateral recess stenosis. Mild right with moderate left L4 foraminal narrowing. L5-S1: Mild disc  bulge with reactive endplate spurring. No significant spinal stenosis. Mild bilateral L5 foraminal narrowing. IMPRESSION: 1. Interval development of marrow edema about the C5-6 interspace since recent exam, concerning for possible developing osteomyelitis. Adjacent prevertebral/retropharyngeal edema has slightly increased from prior. 2. Abnormal inflammatory changes involving the left psoas muscle, concerning for acute infection with myositis. Superimposed 1.3 cm area of T2 signal intensity suspicious for phlegmon/early abscess formation. Fluid signal intensity within the L4-5 interspace is nonspecific, but could reflect changes of early discitis, and possibly be the source of the left psoas infection. 3. Moderate facet degeneration at L4-5 with associated reactive marrow edema and enhancement. Findings are favored to be degenerative in nature, although attention at follow-up is recommended. 4. Irregular and partially loculated left pleural effusion. 5. Underlying diffusely abnormal marrow edema, stable from prior. Electronically Signed   By: Jeannine Boga M.D.   On: 08/13/2021 20:29   MR THORACIC SPINE W WO CONTRAST  Result Date: 08/13/2021 CLINICAL DATA:  Initial evaluation for acute severe lower back pain, sepsis, concern for infection. EXAM: MRI CERVICAL, THORACIC AND LUMBAR SPINE WITHOUT AND WITH CONTRAST TECHNIQUE: Multiplanar and multiecho pulse sequences of the cervical spine, to include the craniocervical junction and cervicothoracic junction, and thoracic and lumbar spine, were obtained without and with intravenous contrast. CONTRAST:  49mL GADAVIST GADOBUTROL 1 MMOL/ML IV SOLN COMPARISON:  Comparison made with previous MRI from 08/12/2021 as well as prior CT from 08/08/2021. FINDINGS: MRI CERVICAL SPINE FINDINGS Alignment: Physiologic with preservation of the normal cervical lordosis. Vertebrae: Diffusely decreased T1 signal intensity seen throughout the visualized bone marrow, nonspecific, but  most commonly related to anemia, smoking, or obesity. Fatty marrow conversion noted about the C5-6 interspace. No discrete or worrisome osseous lesions. There is increased STIR signal intensity with possible mild enhancement about the C5-6 interspace as compared to previous, suspicious for possible developing osteomyelitis no convincing evidence for acute discitis within the intervening C5-6 interspace at this time. No other abnormal marrow edema or enhancement to suggest acute infection elsewhere within the cervical spine. Cord: Normal signal  and morphology. No epidural abscess or other collection. No abnormal enhancement. Posterior Fossa, vertebral arteries, paraspinal tissues: Prevertebral/retropharyngeal edema again seen extending from the skull base to the level of C5-6, slightly increased in prominence as compared to prior exam. No visible loculated soft tissue collection. Normal flow voids seen within the vertebral arteries bilaterally. Disc levels: No other new or progressive finding as compared to recent MRI. No high-grade spinal stenosis. MRI THORACIC SPINE FINDINGS Alignment: Physiologic with preservation of the normal thoracic kyphosis. No listhesis. Vertebrae: Diffusely decreased T1 signal intensity seen throughout the visualized bone marrow. No discrete or worrisome osseous lesions. No abnormal marrow edema or enhancement to suggest osteomyelitis discitis or septic arthritis. Cord: Normal signal and morphology. No epidural abscess or other collection. No abnormal enhancement. Paraspinal and other soft tissues: Prominent paravertebral T2 hyperintense soft tissue again seen, likely reflecting prominent paravertebral fat. Extension into the subpleural spaces bilaterally. Superimposed irregular left pleural effusion which appears partially loculated. Multiple prominent venous collaterals noted throughout the visualized subcutaneous fat. Disc levels: No significant disc pathology.  No stenosis or neural  impingement. MRI LUMBAR SPINE FINDINGS Segmentation: Standard. Lowest well-formed disc space labeled the L5-S1 level. Alignment: Trace anterolisthesis of L4 on L5, chronic and facet mediated. Alignment otherwise normal with preservation of the normal lumbar lordosis. Vertebrae: Diffusely decreased T1 signal intensity seen throughout the visualized bone marrow. No discrete or worrisome osseous lesions. Endplate Schmorl's node deformities with associated fatty marrow conversion seen about the L2 vertebral body. There is fluid signal intensity within the L4-5 disc space without discernible enhancement, nonspecific, but could reflect early changes of acute discitis. No overt evidence for associated osteomyelitis within the adjacent vertebral bodies. Marrow edema and enhancement seen about the L4-5 facets bilaterally, favored to be degenerative in nature. No other abnormal marrow edema or enhancement. Conus medullaris and cauda equina: Conus extends to the L1-2 level. Conus and cauda equina appear normal. No epidural abscess or other collection. No abnormal enhancement. Paraspinal and other soft tissues: There is abnormal edema and enhancement involving the left psoas muscle, extending from the level of L3 inferiorly (series 41, image 35). Findings concerning for acute infection. Superimposed 1.3 cm area of T2 signal intensity suspicious for phlegmon/early abscess formation (series 34, image 38). No other loculated soft tissue collections. Multiple prominent venous collaterals noted throughout the subcutaneous fat. 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 Disc levels: L1-2: Endplate Schmorl's node deformity without significant disc bulge. Mild facet hypertrophy. No stenosis. L2-3: Mild disc bulge with endplate Schmorl's node deformity. No spinal stenosis. Mild right L2 foraminal narrowing. L3-4: Mild disc bulge. Mild bilateral facet hypertrophy. No significant spinal stenosis. Mild right L3  foraminal narrowing. L4-5: Disc bulge with reactive endplate change. Nonspecific fluid signal intensity again noted within the L4-5 interspace. Moderate bilateral facet hypertrophy with associated reactive marrow edema and enhancement and small joint effusions. Resultant mild canal with mild to moderate left greater than right lateral recess stenosis. Mild right with moderate left L4 foraminal narrowing. L5-S1: Mild disc bulge with reactive endplate spurring. No significant spinal stenosis. Mild bilateral L5 foraminal narrowing. IMPRESSION: 1. Interval development of marrow edema about the C5-6 interspace since recent exam, concerning for possible developing osteomyelitis. Adjacent prevertebral/retropharyngeal edema has slightly increased from prior. 2. Abnormal inflammatory changes involving the left psoas muscle, concerning for acute infection with myositis. Superimposed 1.3 cm area of T2 signal intensity suspicious for phlegmon/early abscess formation. Fluid signal intensity within the L4-5 interspace is nonspecific, but could reflect changes  of early discitis, and possibly be the source of the left psoas infection. 3. Moderate facet degeneration at L4-5 with associated reactive marrow edema and enhancement. Findings are favored to be degenerative in nature, although attention at follow-up is recommended. 4. Irregular and partially loculated left pleural effusion. 5. Underlying diffusely abnormal marrow edema, stable from prior. Electronically Signed   By: Jeannine Boga M.D.   On: 08/13/2021 20:29   MR Lumbar Spine W Wo Contrast  Result Date: 08/13/2021 CLINICAL DATA:  Initial evaluation for acute severe lower back pain, sepsis, concern for infection. EXAM: MRI CERVICAL, THORACIC AND LUMBAR SPINE WITHOUT AND WITH CONTRAST TECHNIQUE: Multiplanar and multiecho pulse sequences of the cervical spine, to include the craniocervical junction and cervicothoracic junction, and thoracic and lumbar spine, were  obtained without and with intravenous contrast. CONTRAST:  38mL GADAVIST GADOBUTROL 1 MMOL/ML IV SOLN COMPARISON:  Comparison made with previous MRI from 08/12/2021 as well as prior CT from 08/08/2021. FINDINGS: MRI CERVICAL SPINE FINDINGS Alignment: Physiologic with preservation of the normal cervical lordosis. Vertebrae: Diffusely decreased T1 signal intensity seen throughout the visualized bone marrow, nonspecific, but most commonly related to anemia, smoking, or obesity. Fatty marrow conversion noted about the C5-6 interspace. No discrete or worrisome osseous lesions. There is increased STIR signal intensity with possible mild enhancement about the C5-6 interspace as compared to previous, suspicious for possible developing osteomyelitis no convincing evidence for acute discitis within the intervening C5-6 interspace at this time. No other abnormal marrow edema or enhancement to suggest acute infection elsewhere within the cervical spine. Cord: Normal signal and morphology. No epidural abscess or other collection. No abnormal enhancement. Posterior Fossa, vertebral arteries, paraspinal tissues: Prevertebral/retropharyngeal edema again seen extending from the skull base to the level of C5-6, slightly increased in prominence as compared to prior exam. No visible loculated soft tissue collection. Normal flow voids seen within the vertebral arteries bilaterally. Disc levels: No other new or progressive finding as compared to recent MRI. No high-grade spinal stenosis. MRI THORACIC SPINE FINDINGS Alignment: Physiologic with preservation of the normal thoracic kyphosis. No listhesis. Vertebrae: Diffusely decreased T1 signal intensity seen throughout the visualized bone marrow. No discrete or worrisome osseous lesions. No abnormal marrow edema or enhancement to suggest osteomyelitis discitis or septic arthritis. Cord: Normal signal and morphology. No epidural abscess or other collection. No abnormal enhancement.  Paraspinal and other soft tissues: Prominent paravertebral T2 hyperintense soft tissue again seen, likely reflecting prominent paravertebral fat. Extension into the subpleural spaces bilaterally. Superimposed irregular left pleural effusion which appears partially loculated. Multiple prominent venous collaterals noted throughout the visualized subcutaneous fat. Disc levels: No significant disc pathology.  No stenosis or neural impingement. MRI LUMBAR SPINE FINDINGS Segmentation: Standard. Lowest well-formed disc space labeled the L5-S1 level. Alignment: Trace anterolisthesis of L4 on L5, chronic and facet mediated. Alignment otherwise normal with preservation of the normal lumbar lordosis. Vertebrae: Diffusely decreased T1 signal intensity seen throughout the visualized bone marrow. No discrete or worrisome osseous lesions. Endplate Schmorl's node deformities with associated fatty marrow conversion seen about the L2 vertebral body. There is fluid signal intensity within the L4-5 disc space without discernible enhancement, nonspecific, but could reflect early changes of acute discitis. No overt evidence for associated osteomyelitis within the adjacent vertebral bodies. Marrow edema and enhancement seen about the L4-5 facets bilaterally, favored to be degenerative in nature. No other abnormal marrow edema or enhancement. Conus medullaris and cauda equina: Conus extends to the L1-2 level. Conus and cauda equina appear normal.  No epidural abscess or other collection. No abnormal enhancement. Paraspinal and other soft tissues: There is abnormal edema and enhancement involving the left psoas muscle, extending from the level of L3 inferiorly (series 41, image 35). Findings concerning for acute infection. Superimposed 1.3 cm area of T2 signal intensity suspicious for phlegmon/early abscess formation (series 34, image 38). No other loculated soft tissue collections. Multiple prominent venous collaterals noted throughout the  subcutaneous fat. 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 Disc levels: L1-2: Endplate Schmorl's node deformity without significant disc bulge. Mild facet hypertrophy. No stenosis. L2-3: Mild disc bulge with endplate Schmorl's node deformity. No spinal stenosis. Mild right L2 foraminal narrowing. L3-4: Mild disc bulge. Mild bilateral facet hypertrophy. No significant spinal stenosis. Mild right L3 foraminal narrowing. L4-5: Disc bulge with reactive endplate change. Nonspecific fluid signal intensity again noted within the L4-5 interspace. Moderate bilateral facet hypertrophy with associated reactive marrow edema and enhancement and small joint effusions. Resultant mild canal with mild to moderate left greater than right lateral recess stenosis. Mild right with moderate left L4 foraminal narrowing. L5-S1: Mild disc bulge with reactive endplate spurring. No significant spinal stenosis. Mild bilateral L5 foraminal narrowing. IMPRESSION: 1. Interval development of marrow edema about the C5-6 interspace since recent exam, concerning for possible developing osteomyelitis. Adjacent prevertebral/retropharyngeal edema has slightly increased from prior. 2. Abnormal inflammatory changes involving the left psoas muscle, concerning for acute infection with myositis. Superimposed 1.3 cm area of T2 signal intensity suspicious for phlegmon/early abscess formation. Fluid signal intensity within the L4-5 interspace is nonspecific, but could reflect changes of early discitis, and possibly be the source of the left psoas infection. 3. Moderate facet degeneration at L4-5 with associated reactive marrow edema and enhancement. Findings are favored to be degenerative in nature, although attention at follow-up is recommended. 4. Irregular and partially loculated left pleural effusion. 5. Underlying diffusely abnormal marrow edema, stable from prior. Electronically Signed   By: Jeannine Boga M.D.   On:  08/13/2021 20:29   ECHOCARDIOGRAM COMPLETE  Result Date: 08/13/2021    ECHOCARDIOGRAM REPORT   Patient Name:   [name] [name] Date of Exam: 08/13/2021 Medical Rec #:  [id number]     Height:       73.0 in Accession #:    [id number]    Weight:       200.6 lb Date of Birth:  Nov 15, 1961     BSA:          2.154 m Patient Age:    60 years      BP:           109/48 mmHg Patient Gender: M             HR:           95 bpm. Exam Location:  [facility] Procedure: 2D Echo, 3D Echo and Strain Analysis Indications:     Bacteremia R78.81  History:         Patient has no prior history of Echocardiogram examinations.  Sonographer:     Kathlen Brunswick RDCS Referring Phys:  [id number] Otila Kluver Shantoria Ellwood Diagnosing Phys: Kate Sable MD IMPRESSIONS  1. Left ventricular ejection fraction, by estimation, is 30 to 35%. The left ventricle has moderate to severely decreased function. The left ventricle demonstrates global hypokinesis. There is mild left ventricular hypertrophy. Left ventricular diastolic parameters are consistent with Grade I diastolic dysfunction (impaired relaxation). The average left ventricular global longitudinal strain is -6.4 %. The global longitudinal strain is abnormal.  2. Right ventricular systolic function is moderately reduced. The right ventricular size is moderately enlarged.  3. Right atrial size was mild to moderately dilated.  4. The mitral valve is degenerative. Mild mitral valve regurgitation.  5. Tricuspid valve regurgitation is mild to moderate.  6. The aortic valve is calcified. Aortic valve regurgitation is moderate. Mild to moderate aortic valve stenosis. Aortic valve area, by VTI measures 1.52 cm. Aortic valve mean gradient measures 20.2 mmHg.  7. The inferior vena cava is normal in size with <50% respiratory variability, suggesting right atrial pressure of 8 mmHg. FINDINGS  Left Ventricle: Left ventricular ejection fraction, by estimation, is 30 to 35%. The left ventricle has moderate to severely decreased  function. The left ventricle demonstrates global hypokinesis. The average left ventricular global longitudinal strain is -6.4 %. The global longitudinal strain is abnormal. 3D left ventricular ejection fraction analysis performed but not reported based on interpreter judgement due to suboptimal quality. The left ventricular internal cavity size was normal in size. There  is mild left ventricular hypertrophy. Left ventricular diastolic parameters are consistent with Grade I diastolic dysfunction (impaired relaxation). Right Ventricle: The right ventricular size is moderately enlarged. No increase in right ventricular wall thickness. Right ventricular systolic function is moderately reduced. Left Atrium: Left atrial size was normal in size. Right Atrium: Right atrial size was mild to moderately dilated. Pericardium: There is no evidence of pericardial effusion. Mitral Valve: The mitral valve is degenerative in appearance. Mild mitral valve regurgitation. Tricuspid Valve: The tricuspid valve is not well visualized. Tricuspid valve regurgitation is mild to moderate. Aortic Valve: The aortic valve is calcified. Aortic valve regurgitation is moderate. Aortic regurgitation PHT measures 227 msec. Mild to moderate aortic stenosis is present. Aortic valve mean gradient measures 20.2 mmHg. Aortic valve peak gradient measures 33.8 mmHg. Aortic valve area, by VTI measures 1.52 cm. Pulmonic Valve: The pulmonic valve was not well visualized. Pulmonic valve regurgitation is not visualized. Aorta: The aortic root is normal in size and structure. Venous: The inferior vena cava is normal in size with less than 50% respiratory variability, suggesting right atrial pressure of 8 mmHg. IAS/Shunts: No atrial level shunt detected by color flow Doppler.  LEFT VENTRICLE PLAX 2D LVIDd:         6.00 cm      Diastology LVIDs:         4.80 cm      LV e' medial:    5.77 cm/s LV PW:         1.30 cm      LV E/e' medial:  13.7 LV IVS:        1.20 cm       LV e' lateral:   7.18 cm/s LVOT diam:     2.20 cm      LV E/e' lateral: 11.0 LV SV:         82 LV SV Index:   38           2D Longitudinal Strain LVOT Area:     3.80 cm     2D Strain GLS Avg:     -6.4 %  LV Volumes (MOD) LV vol d, MOD A2C: 214.0 ml 3D Volume EF: LV vol d, MOD A4C: 242.0 ml 3D EF:        36 % LV vol s, MOD A2C: 140.5 ml LV EDV:       327 ml LV vol s, MOD A4C: 156.0 ml LV ESV:  209 ml LV SV MOD A2C:     73.5 ml  LV SV:        119 ml LV SV MOD A4C:     242.0 ml LV SV MOD BP:      83.8 ml RIGHT VENTRICLE RV Basal diam:  4.60 cm TAPSE (M-mode): 1.8 cm LEFT ATRIUM           Index       RIGHT ATRIUM           Index LA diam:      5.10 cm 2.37 cm/m  RA Area:     24.50 cm LA Vol (A2C): 67.2 ml 31.19 ml/m RA Volume:   89.40 ml  41.50 ml/m LA Vol (A4C): 40.2 ml 18.66 ml/m  AORTIC VALVE                    PULMONIC VALVE AV Area (Vmax):    1.64 cm     PV Vmax:       1.03 m/s AV Area (Vmean):   1.37 cm     PV Peak grad:  4.2 mmHg AV Area (VTI):     1.52 cm AV Vmax:           290.50 cm/s AV Vmean:          211.750 cm/s AV VTI:            0.539 m AV Peak Grad:      33.8 mmHg AV Mean Grad:      20.2 mmHg LVOT Vmax:         125.00 cm/s LVOT Vmean:        76.500 cm/s LVOT VTI:          0.216 m LVOT/AV VTI ratio: 0.40 AI PHT:            227 msec  AORTA Ao Root diam: 3.70 cm Ao Asc diam:  3.80 cm MITRAL VALVE                TRICUSPID VALVE MV Area (PHT): 5.02 cm     TV Peak grad:   32.1 mmHg MV Decel Time: 151 msec     TV Vmax:        2.84 m/s MV E velocity: 78.80 cm/s MV A velocity: 113.00 cm/s  SHUNTS MV E/A ratio:  0.70         Systemic VTI:  0.22 m                             Systemic Diam: 2.20 cm Kate Sable MD Electronically signed by Kate Sable MD Signature Date/Time: 08/13/2021/3:47:36 PM    Final      Scheduled Meds:  apixaban  2.5 mg Oral BID   calcium acetate  2,668 mg Oral TID WC   Chlorhexidine Gluconate Cloth  6 each Topical Q0600   epoetin (EPOGEN/PROCRIT) injection   4,000 Units Intravenous Q M,W,F-HD   feeding supplement (NEPRO CARB STEADY)  237 mL Oral TID BM   lidocaine  2 patch Transdermal Q24H   nicotine  21 mg Transdermal Daily   Continuous Infusions:  ampicillin (OMNIPEN) IV 2 g (08/14/21 0526)   cefTRIAXone (ROCEPHIN)  IV 2 g (08/14/21 1514)   sodium chloride     sodium chloride       LOS: 5 days     Enzo Bi, MD [facility] If 7PM-7AM, please contact night-coverage 08/14/2021, 3:40  PM

## 2021-08-15 DIAGNOSIS — M5440 Lumbago with sciatica, unspecified side: Secondary | ICD-10-CM | POA: Diagnosis not present

## 2021-08-15 LAB — CBC
HCT: 27.3 % — ABNORMAL LOW (ref 39.0–52.0)
Hemoglobin: 8.7 g/dL — ABNORMAL LOW (ref 13.0–17.0)
MCH: 31 pg (ref 26.0–34.0)
MCHC: 31.9 g/dL (ref 30.0–36.0)
MCV: 97.2 fL (ref 80.0–100.0)
Platelets: 179 10*3/uL (ref 150–400)
RBC: 2.81 MIL/uL — ABNORMAL LOW (ref 4.22–5.81)
RDW: 16.4 % — ABNORMAL HIGH (ref 11.5–15.5)
WBC: 8.5 10*3/uL (ref 4.0–10.5)
nRBC: 0 % (ref 0.0–0.2)

## 2021-08-15 LAB — BASIC METABOLIC PANEL
Anion gap: 11 (ref 5–15)
BUN: 46 mg/dL — ABNORMAL HIGH (ref 6–20)
CO2: 29 mmol/L (ref 22–32)
Calcium: 10.1 mg/dL (ref 8.9–10.3)
Chloride: 96 mmol/L — ABNORMAL LOW (ref 98–111)
Creatinine, Ser: 7.01 mg/dL — ABNORMAL HIGH (ref 0.61–1.24)
GFR, Estimated: 8 mL/min — ABNORMAL LOW (ref >60)
Glucose, Bld: 102 mg/dL — ABNORMAL HIGH (ref 70–99)
Potassium: 4 mmol/L (ref 3.5–5.1)
Sodium: 136 mmol/L (ref 135–145)

## 2021-08-15 LAB — MAGNESIUM: Magnesium: 1.9 mg/dL (ref 1.7–2.4)

## 2021-08-15 MED ORDER — DOCUSATE SODIUM 100 MG PO CAPS
100.0000 mg | ORAL_CAPSULE | Freq: Two times a day (BID) | ORAL | Status: DC
  Administered 2021-08-15 – 2021-08-16 (×4): 100 mg via ORAL
  Filled 2021-08-15 (×4): qty 1

## 2021-08-15 MED ORDER — SENNOSIDES-DOCUSATE SODIUM 8.6-50 MG PO TABS
1.0000 | ORAL_TABLET | Freq: Two times a day (BID) | ORAL | Status: DC
  Administered 2021-08-15 – 2021-08-29 (×17): 1 via ORAL
  Filled 2021-08-15 (×22): qty 1

## 2021-08-15 NOTE — Progress Notes (Signed)
PROGRESS NOTE    [name] [name]  DGL:[id number] DOB: [birth year] DOA: 08/08/2021 PCP: Pcp, No  [id number]/[id number]-AA   Assessment & Plan:   Principal Problem:   Lower back pain Active Problems:   End-stage renal disease on hemodialysis ([place])   Polysubstance abuse ([place])   Tobacco abuse   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 (HCC)   Anemia in ESRD (end-stage renal disease) ([place])   HTN (hypertension)   Back pain   [name] [name] is a 60 y.o. male with medical history significant of ESRD-HD (MWF), hypertension, tobacco abuse, polysubstance abuse, diverticulitis, anemia, jugular vein thrombosis, GI bleeding, right pneumothorax, who presents with lower back pain.   Patient states that he has been having lower back pain for more than 1 week, which has been progressively worsening.  The pain is constant, sharp, 10 out of 10 in severity today, nonradiating.  The pain is so severe that he cannot walk.  On arrival he had to be lifted out of his vehicle by security and nursing staff.   Sepsis 2/2  # Enterococcus faecalis bacteremia --developed fever to 101.5 on 9/2, with tachycardia --in the setting of severe back pain, I was concerned about spinal or paraspinal infection.  MRI spine w contrast showed below. --started on empiric vanc and ceftriaxone and transitioned to IV amp --ID consulted --TTE no mention of endocarditis Plan: --cont IV ampicillin and ceftriaxone --cardio consult for TEE, will be performed on Thursday --ID to determine abx choice and duration after TEE results  Severe Lower back pain # Possible developing osteomyelitis in C5-6 # Left psoas muscle infection with superimposed 1.3 cm phlegmon/early abscess formation # Possible early discitis in L4-5 CT scan showed multilevel degenerative and canal and foraminal stenosis, greatest at L4-L5.    --back pain out of proportion to imaging finding  --MRI spine w contrast showed several infectious findings, likely seeded  from Enterococcus bacteremia. Plan: --cont Percocet 5 mg q6h PRN (hold if systolic <518) --lidocaine patches --cont abx as above  End-stage renal disease on hemodialysis (MWF) --cont phoslo --iHD per nephro with home schedule   Polysubstance abuse and tobacco abuse -Did counseling about importance of quitting substance use and tobacco use --cont nicotine patch   Anemia in ESRD (end-stage renal disease) ([place]):  --Hgb 8's --Epo with dialysis --transfuse if Hgb <7  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  --Eliquis changed from 5 mg daily to 2.5 mg bid per Dr. Keturah Barre recommendation. --cont Eliquis   HTN, not currently active Hypotension --BP has been intermittently low in 90's --hold amlodipine --small bolus if systolic <84'Z.  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  Enterococcus bacteremia on IV abx, pending TEE   Subjective and Interval History:  Pt was more awake now, pain appeared more controlled.  Denied pain in his neck.     Objective: Vitals:   08/14/21 1933 08/15/21 0028 08/15/21 0438 08/15/21 0753  BP: 116/61 (!) 136/96 137/70 (!) 144/67  Pulse: 96 (!) 109 (!) 109 (!) 109  Resp: 19 18 18 18   Temp: 97.9 F (36.6 C) 97.7 F (36.5 C) 98.2 F (36.8 C) 98.2 F (36.8 C)  TempSrc:      SpO2: 98% 95% 100% 95%  Weight:      Height:        Intake/Output Summary (Last 24 hours) at 08/15/2021 1526 Last data filed at 08/14/2021 1600 Gross per 24 hour  Intake 100 ml  Output --  Net 100 ml    Filed Weights   08/08/21 0625  Weight: 91 kg    Examination:   Constitutional: NAD, more awake HEENT: conjunctivae and lids  normal, EOMI CV: No cyanosis.   RESP: normal respiratory effort, on RA Extremities: arms swollen. SKIN: warm, dry Abdomen:  dilated veins all over trunk and abdomen (chronic)   Data Reviewed: I have personally reviewed following labs and imaging studies  CBC: Recent Labs  Lab 08/11/21 0226 08/12/21 0613 08/13/21 0827 08/14/21 0614 08/15/21 0446  WBC 8.5 9.6 8.8 8.2 8.5  HGB 9.9* 9.2* 8.4* 8.6* 8.7*  HCT 31.1* 30.2* 26.9* 28.7* 27.3*  MCV 96.0 96.2 97.8 96.0 97.2  PLT 145* 143* 138* 180 119   Basic Metabolic Panel: Recent Labs  Lab 08/11/21 0226 08/12/21 0613 08/13/21 0827 08/14/21 0614 08/15/21 0446  NA 136 138 136 135 136  K 5.0 4.9 4.6 4.8 4.0  CL 99 98 97* 96* 96*  CO2 28 28 29 28 29   GLUCOSE 89 94 90 111* 102*  BUN 65* 56* 55* 65* 46*  CREATININE 9.61* 8.52* 7.92* 9.28* 7.01*  CALCIUM 9.9 9.6 10.0 10.0 10.1  MG 2.2 2.0 1.9 2.0 1.9  PHOS 7.6*  --   --   --   --    GFR: Estimated Creatinine Clearance: 13.1 mL/min (A) (by C-G formula based on SCr of 7.01 mg/dL (H)). Liver Function Tests: No results for input(s): AST, ALT, ALKPHOS, BILITOT, PROT, ALBUMIN in the last 168 hours.  No results for input(s): LIPASE, AMYLASE in the last 168 hours. No results for input(s): AMMONIA in the last 168 hours. Coagulation Profile: No results for input(s): INR, PROTIME in the last 168 hours. Cardiac Enzymes: No results for input(s): CKTOTAL, CKMB, CKMBINDEX, TROPONINI in the last 168 hours. BNP (last 3 results) No results for input(s): PROBNP in the last 8760 hours. HbA1C: No results for input(s): HGBA1C in the last 72 hours. CBG: Recent Labs  Lab 08/14/21 0742 08/14/21 1639  GLUCAP 93 346*   Lipid Profile: No results for input(s): CHOL, HDL, LDLCALC, TRIG, CHOLHDL, LDLDIRECT in the last 72 hours. Thyroid Function Tests: No results for input(s): TSH, T4TOTAL, FREET4, T3FREE, THYROIDAB in the last 72 hours. Anemia Panel: No results for input(s): VITAMINB12,  FOLATE, FERRITIN, TIBC, IRON, RETICCTPCT in the last 72 hours. Sepsis Labs: Recent Labs  Lab 08/11/21 0226 08/11/21 1747 08/11/21 2002 08/12/21 0613 08/13/21 0827  PROCALCITON 5.82  --   --  5.15 4.61  LATICACIDVEN  --  0.9 1.1  --   --     Recent Results (from the past 240 hour(s))  Resp Panel by RT-PCR (Flu A&B, Covid) Nasopharyngeal Swab     Status: None   Collection Time: 08/08/21  1:22 PM   Specimen: Nasopharyngeal Swab; Nasopharyngeal(NP) swabs in vial transport medium  Result Value Ref Range Status   SARS Coronavirus 2 by RT PCR NEGATIVE NEGATIVE Final    Comment: (NOTE) SARS-CoV-2 target nucleic acids are NOT DETECTED.  The SARS-CoV-2 RNA is generally detectable in upper respiratory specimens during the acute phase of infection. The lowest concentration of SARS-CoV-2 viral copies this assay can detect is 138 copies/mL. A negative result  does not preclude SARS-Cov-2 infection and should not be used as the sole basis for treatment or other patient management decisions. A negative result may occur with  improper specimen collection/handling, submission of specimen other than nasopharyngeal swab, presence of viral mutation(s) within the areas targeted by this assay, and inadequate number of viral copies(<138 copies/mL). A negative result must be combined with clinical observations, patient history, and epidemiological information. The expected result is Negative.  Fact Sheet for Patients:  EntrepreneurPulse.com.au  Fact Sheet for Healthcare Providers:  IncredibleEmployment.be  This test is no t yet approved or cleared by the [place] FDA and  has been authorized for detection and/or diagnosis of SARS-CoV-2 by FDA under an Emergency Use Authorization (EUA). This EUA will remain  in effect (meaning this test can be used) for the duration of the COVID-19 declaration under Section 564(b)(1) of the Act, 21 U.S.C.section  360bbb-3(b)(1), unless the authorization is terminated  or revoked sooner.       Influenza A by PCR NEGATIVE NEGATIVE Final   Influenza B by PCR NEGATIVE NEGATIVE Final    Comment: (NOTE) The Xpert Xpress SARS-CoV-2/FLU/RSV plus assay is intended as an aid in the diagnosis of influenza from Nasopharyngeal swab specimens and should not be used as a sole basis for treatment. Nasal washings and aspirates are unacceptable for Xpert Xpress SARS-CoV-2/FLU/RSV testing.  Fact Sheet for Patients: EntrepreneurPulse.com.au  Fact Sheet for Healthcare Providers: IncredibleEmployment.be  This test is not yet approved or cleared by the [place] FDA and has been authorized for detection and/or diagnosis of SARS-CoV-2 by FDA under an Emergency Use Authorization (EUA). This EUA will remain in effect (meaning this test can be used) for the duration of the COVID-19 declaration under Section 564(b)(1) of the Act, 21 U.S.C. section 360bbb-3(b)(1), unless the authorization is terminated or revoked.  Performed at [facility], [address] [place], [place] [address]   Culture, blood (Routine X 2) w Reflex to ID Panel     Status: Abnormal   Collection Time: 08/11/21  5:47 PM   Specimen: BLOOD  Result Value Ref Range Status   Specimen Description   Final    BLOOD LEFT HAND Performed at [facility], [address] [place], [place] [address]    Special Requests   Final    BOTTLES DRAWN AEROBIC AND ANAEROBIC Blood Culture adequate volume Performed at [facility], [address] [place], [place] [address]    Culture  Setup Time   Final    GRAM POSITIVE COCCI IN CHAINS IN BOTH AEROBIC AND ANAEROBIC BOTTLES Gram Stain Report Called to,Read Back By and Verified With: JASON ROBBINS 08/12/21 @ 0507 BY SB Performed at [facility], [address] [place], [place] [address]    Culture (A)  Final    ENTEROCOCCUS  FAECALIS SUSCEPTIBILITIES PERFORMED ON PREVIOUS CULTURE WITHIN THE LAST 5 DAYS. Performed at [facility], [place] [address], [place], [place] [address]    Report Status 08/14/2021 FINAL  Final  Culture, blood (Routine X 2) w Reflex to ID Panel     Status: Abnormal   Collection Time: 08/11/21  5:47 PM   Specimen: BLOOD  Result Value Ref Range Status   Specimen Description   Final    BLOOD FOA Performed at [facility], [address], [place],  [address]    Special Requests   Final    BOTTLES DRAWN AEROBIC AND ANAEROBIC Blood Culture adequate volume Performed at [facility], [place], [place] [address]    Culture  Setup Time   Final    GRAM POSITIVE COCCI IN CHAINS IN BOTH AEROBIC AND ANAEROBIC BOTTLES Gram Stain Report Called to,Read Back By and Verified With: JASON ROBBINS 08/12/21 @ 0507 BY SB Performed at [facility], [place] [address], [place], [place] [address]    Culture ENTEROCOCCUS FAECALIS (A)  Final   Report Status 08/14/2021 FINAL  Final   Organism ID, Bacteria ENTEROCOCCUS FAECALIS  Final      Susceptibility   Enterococcus faecalis - MIC*    AMPICILLIN <=2 SENSITIVE Sensitive     VANCOMYCIN 2 SENSITIVE Sensitive     GENTAMICIN SYNERGY SENSITIVE Sensitive     * ENTEROCOCCUS FAECALIS  Blood Culture ID Panel (Reflexed)     Status: Abnormal   Collection Time: 08/11/21  5:47 PM  Result Value Ref Range Status   Enterococcus faecalis DETECTED (A) NOT DETECTED Final    Comment: CRITICAL RESULT CALLED TO, READ BACK BY AND VERIFIED WITH: JASON ROBBINS 08/12/21 @ 0507 BY SB    Enterococcus Faecium NOT DETECTED NOT DETECTED Final   Listeria monocytogenes NOT DETECTED NOT DETECTED Final   Staphylococcus species NOT DETECTED NOT DETECTED Final   Staphylococcus aureus (BCID) NOT DETECTED NOT DETECTED Final   Staphylococcus epidermidis NOT DETECTED NOT DETECTED Final   Staphylococcus lugdunensis NOT DETECTED NOT DETECTED Final    Streptococcus species NOT DETECTED NOT DETECTED Final   Streptococcus agalactiae NOT DETECTED NOT DETECTED Final   Streptococcus pneumoniae NOT DETECTED NOT DETECTED Final   Streptococcus pyogenes NOT DETECTED NOT DETECTED Final   A.calcoaceticus-baumannii NOT DETECTED NOT DETECTED Final   Bacteroides fragilis NOT DETECTED NOT DETECTED Final   Enterobacterales NOT DETECTED NOT DETECTED Final   Enterobacter cloacae complex NOT DETECTED NOT DETECTED Final   Escherichia coli NOT DETECTED NOT DETECTED Final   Klebsiella aerogenes NOT DETECTED NOT DETECTED Final   Klebsiella oxytoca NOT DETECTED NOT DETECTED Final   Klebsiella pneumoniae NOT DETECTED NOT DETECTED Final   Proteus species NOT DETECTED NOT DETECTED Final   Salmonella species NOT DETECTED NOT DETECTED Final   Serratia marcescens NOT DETECTED NOT DETECTED Final   Haemophilus influenzae NOT DETECTED NOT DETECTED Final   Neisseria meningitidis NOT DETECTED NOT DETECTED Final   Pseudomonas aeruginosa NOT DETECTED NOT DETECTED Final   Stenotrophomonas maltophilia NOT DETECTED NOT DETECTED Final   Candida albicans NOT DETECTED NOT DETECTED Final   Candida auris NOT DETECTED NOT DETECTED Final   Candida glabrata NOT DETECTED NOT DETECTED Final   Candida krusei NOT DETECTED NOT DETECTED Final   Candida parapsilosis NOT DETECTED NOT DETECTED Final   Candida tropicalis NOT DETECTED NOT DETECTED Final   Cryptococcus neoformans/gattii NOT DETECTED NOT DETECTED Final   Vancomycin resistance NOT DETECTED NOT DETECTED Final    Comment: Performed at [facility], [address] [place], [place] [address] [place] [address]  CULTURE, BLOOD (ROUTINE X 2) w Reflex to ID Panel     Status: None (Preliminary result)   Collection Time: 08/12/21 11:58 PM   Specimen: BLOOD  Result Value Ref Range Status   Specimen Description BLOOD BLOOD LEFT HAND  Final   Special Requests   Final    BOTTLES DRAWN AEROBIC AND ANAEROBIC Blood Culture adequate volume    Culture   Final    NO GROWTH 2 DAYS Performed at [facility], [address] [place], [place] [address]    Report Status PENDING  Incomplete  CULTURE, BLOOD (ROUTINE X 2) w Reflex to ID Panel     Status: None (Preliminary result)   Collection Time: 08/12/21 11:58 PM   Specimen: BLOOD  Result Value Ref Range Status   Specimen Description BLOOD LEFT RADIAL  Final   Special Requests   Final    BOTTLES DRAWN AEROBIC AND ANAEROBIC Blood Culture results may not be optimal due to an inadequate volume of blood received in culture bottles   Culture   Final    NO GROWTH 2 DAYS Performed at [facility], [address], [place], [place] [address]    Report Status PENDING  Incomplete      Radiology Studies: MR CERVICAL SPINE W WO CONTRAST  Result Date: 08/13/2021 CLINICAL DATA:  Initial evaluation for acute severe lower back pain, sepsis, concern for infection. EXAM: MRI CERVICAL, THORACIC AND LUMBAR SPINE WITHOUT AND WITH CONTRAST TECHNIQUE: Multiplanar and multiecho pulse sequences of the cervical spine, to include the craniocervical junction and cervicothoracic junction, and thoracic and lumbar spine, were obtained without and with intravenous contrast. CONTRAST:  54mL GADAVIST GADOBUTROL 1 MMOL/ML IV SOLN COMPARISON:  Comparison made with previous MRI from 08/12/2021 as well as prior CT from 08/08/2021. FINDINGS: MRI CERVICAL SPINE FINDINGS Alignment: Physiologic with preservation of the normal cervical lordosis. Vertebrae: Diffusely decreased T1 signal intensity seen throughout the visualized bone marrow, nonspecific, but most commonly related to anemia, smoking, or obesity. Fatty marrow conversion noted about the C5-6 interspace. No discrete or worrisome osseous lesions. There is increased STIR signal intensity with possible mild enhancement about the C5-6 interspace as compared to previous, suspicious for possible developing osteomyelitis no convincing evidence for acute  discitis within the intervening C5-6 interspace at this time. No other abnormal marrow edema or enhancement to suggest acute infection elsewhere within the cervical spine. Cord: Normal signal and morphology. No epidural abscess or other collection. No abnormal enhancement. Posterior Fossa, vertebral arteries, paraspinal tissues: Prevertebral/retropharyngeal edema again seen extending from the skull base to the level of C5-6, slightly increased in prominence as compared to prior exam. No visible loculated soft tissue collection. Normal flow voids seen within the vertebral arteries bilaterally. Disc levels: No other new or progressive finding as compared to recent MRI. No high-grade spinal stenosis. MRI THORACIC SPINE FINDINGS Alignment: Physiologic with preservation of the normal thoracic kyphosis. No listhesis. Vertebrae: Diffusely decreased T1 signal intensity seen throughout the visualized bone marrow. No discrete or worrisome osseous lesions. No abnormal marrow edema or enhancement to suggest osteomyelitis discitis or septic arthritis. Cord: Normal signal and morphology. No epidural abscess or other collection. No abnormal enhancement. Paraspinal and other soft tissues: Prominent paravertebral T2 hyperintense soft tissue again seen, likely reflecting prominent paravertebral fat. Extension into the subpleural spaces bilaterally. Superimposed irregular left pleural effusion which appears partially loculated. Multiple prominent venous collaterals noted throughout the visualized subcutaneous fat. Disc levels: No significant disc pathology.  No stenosis or neural impingement. MRI LUMBAR SPINE FINDINGS Segmentation: Standard. Lowest well-formed disc space labeled the L5-S1 level. Alignment: Trace anterolisthesis of L4 on L5, chronic and facet mediated. Alignment otherwise normal with preservation of the normal lumbar lordosis. Vertebrae: Diffusely decreased T1 signal intensity seen throughout the visualized bone  marrow. No discrete or worrisome osseous lesions. Endplate Schmorl's node deformities with associated fatty marrow conversion seen about the L2 vertebral body. There is fluid signal intensity within the L4-5 disc space without discernible enhancement, nonspecific, but could reflect early changes of acute discitis. No overt evidence for associated osteomyelitis within  the adjacent vertebral bodies. Marrow edema and enhancement seen about the L4-5 facets bilaterally, favored to be degenerative in nature. No other abnormal marrow edema or enhancement. Conus medullaris and cauda equina: Conus extends to the L1-2 level. Conus and cauda equina appear normal. No epidural abscess or other collection. No abnormal enhancement. Paraspinal and other soft tissues: There is abnormal edema and enhancement involving the left psoas muscle, extending from the level of L3 inferiorly (series 41, image 35). Findings concerning for acute infection. Superimposed 1.3 cm area of T2 signal intensity suspicious for phlegmon/early abscess formation (series 34, image 38). No other loculated soft tissue collections. Multiple prominent venous collaterals noted throughout the subcutaneous fat. 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 Disc levels: L1-2: Endplate Schmorl's node deformity without significant disc bulge. Mild facet hypertrophy. No stenosis. L2-3: Mild disc bulge with endplate Schmorl's node deformity. No spinal stenosis. Mild right L2 foraminal narrowing. L3-4: Mild disc bulge. Mild bilateral facet hypertrophy. No significant spinal stenosis. Mild right L3 foraminal narrowing. L4-5: Disc bulge with reactive endplate change. Nonspecific fluid signal intensity again noted within the L4-5 interspace. Moderate bilateral facet hypertrophy with associated reactive marrow edema and enhancement and small joint effusions. Resultant mild canal with mild to moderate left greater than right lateral recess stenosis.  Mild right with moderate left L4 foraminal narrowing. L5-S1: Mild disc bulge with reactive endplate spurring. No significant spinal stenosis. Mild bilateral L5 foraminal narrowing. IMPRESSION: 1. Interval development of marrow edema about the C5-6 interspace since recent exam, concerning for possible developing osteomyelitis. Adjacent prevertebral/retropharyngeal edema has slightly increased from prior. 2. Abnormal inflammatory changes involving the left psoas muscle, concerning for acute infection with myositis. Superimposed 1.3 cm area of T2 signal intensity suspicious for phlegmon/early abscess formation. Fluid signal intensity within the L4-5 interspace is nonspecific, but could reflect changes of early discitis, and possibly be the source of the left psoas infection. 3. Moderate facet degeneration at L4-5 with associated reactive marrow edema and enhancement. Findings are favored to be degenerative in nature, although attention at follow-up is recommended. 4. Irregular and partially loculated left pleural effusion. 5. Underlying diffusely abnormal marrow edema, stable from prior. Electronically Signed   By: Jeannine Boga M.D.   On: 08/13/2021 20:29   MR THORACIC SPINE W WO CONTRAST  Result Date: 08/13/2021 CLINICAL DATA:  Initial evaluation for acute severe lower back pain, sepsis, concern for infection. EXAM: MRI CERVICAL, THORACIC AND LUMBAR SPINE WITHOUT AND WITH CONTRAST TECHNIQUE: Multiplanar and multiecho pulse sequences of the cervical spine, to include the craniocervical junction and cervicothoracic junction, and thoracic and lumbar spine, were obtained without and with intravenous contrast. CONTRAST:  64mL GADAVIST GADOBUTROL 1 MMOL/ML IV SOLN COMPARISON:  Comparison made with previous MRI from 08/12/2021 as well as prior CT from 08/08/2021. FINDINGS: MRI CERVICAL SPINE FINDINGS Alignment: Physiologic with preservation of the normal cervical lordosis. Vertebrae: Diffusely decreased T1 signal  intensity seen throughout the visualized bone marrow, nonspecific, but most commonly related to anemia, smoking, or obesity. Fatty marrow conversion noted about the C5-6 interspace. No discrete or worrisome osseous lesions. There is increased STIR signal intensity with possible mild enhancement about the C5-6 interspace as compared to previous, suspicious for possible developing osteomyelitis no convincing evidence for acute discitis within the intervening C5-6 interspace at this time. No other abnormal marrow edema or enhancement to suggest acute infection elsewhere within the cervical spine. Cord: Normal signal and morphology. No epidural abscess or other collection. No abnormal enhancement. Posterior Fossa, vertebral  arteries, paraspinal tissues: Prevertebral/retropharyngeal edema again seen extending from the skull base to the level of C5-6, slightly increased in prominence as compared to prior exam. No visible loculated soft tissue collection. Normal flow voids seen within the vertebral arteries bilaterally. Disc levels: No other new or progressive finding as compared to recent MRI. No high-grade spinal stenosis. MRI THORACIC SPINE FINDINGS Alignment: Physiologic with preservation of the normal thoracic kyphosis. No listhesis. Vertebrae: Diffusely decreased T1 signal intensity seen throughout the visualized bone marrow. No discrete or worrisome osseous lesions. No abnormal marrow edema or enhancement to suggest osteomyelitis discitis or septic arthritis. Cord: Normal signal and morphology. No epidural abscess or other collection. No abnormal enhancement. Paraspinal and other soft tissues: Prominent paravertebral T2 hyperintense soft tissue again seen, likely reflecting prominent paravertebral fat. Extension into the subpleural spaces bilaterally. Superimposed irregular left pleural effusion which appears partially loculated. Multiple prominent venous collaterals noted throughout the visualized subcutaneous fat.  Disc levels: No significant disc pathology.  No stenosis or neural impingement. MRI LUMBAR SPINE FINDINGS Segmentation: Standard. Lowest well-formed disc space labeled the L5-S1 level. Alignment: Trace anterolisthesis of L4 on L5, chronic and facet mediated. Alignment otherwise normal with preservation of the normal lumbar lordosis. Vertebrae: Diffusely decreased T1 signal intensity seen throughout the visualized bone marrow. No discrete or worrisome osseous lesions. Endplate Schmorl's node deformities with associated fatty marrow conversion seen about the L2 vertebral body. There is fluid signal intensity within the L4-5 disc space without discernible enhancement, nonspecific, but could reflect early changes of acute discitis. No overt evidence for associated osteomyelitis within the adjacent vertebral bodies. Marrow edema and enhancement seen about the L4-5 facets bilaterally, favored to be degenerative in nature. No other abnormal marrow edema or enhancement. Conus medullaris and cauda equina: Conus extends to the L1-2 level. Conus and cauda equina appear normal. No epidural abscess or other collection. No abnormal enhancement. Paraspinal and other soft tissues: There is abnormal edema and enhancement involving the left psoas muscle, extending from the level of L3 inferiorly (series 41, image 35). Findings concerning for acute infection. Superimposed 1.3 cm area of T2 signal intensity suspicious for phlegmon/early abscess formation (series 34, image 38). No other loculated soft tissue collections. Multiple prominent venous collaterals noted throughout the subcutaneous fat. 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 Disc levels: L1-2: Endplate Schmorl's node deformity without significant disc bulge. Mild facet hypertrophy. No stenosis. L2-3: Mild disc bulge with endplate Schmorl's node deformity. No spinal stenosis. Mild right L2 foraminal narrowing. L3-4: Mild disc bulge. Mild  bilateral facet hypertrophy. No significant spinal stenosis. Mild right L3 foraminal narrowing. L4-5: Disc bulge with reactive endplate change. Nonspecific fluid signal intensity again noted within the L4-5 interspace. Moderate bilateral facet hypertrophy with associated reactive marrow edema and enhancement and small joint effusions. Resultant mild canal with mild to moderate left greater than right lateral recess stenosis. Mild right with moderate left L4 foraminal narrowing. L5-S1: Mild disc bulge with reactive endplate spurring. No significant spinal stenosis. Mild bilateral L5 foraminal narrowing. IMPRESSION: 1. Interval development of marrow edema about the C5-6 interspace since recent exam, concerning for possible developing osteomyelitis. Adjacent prevertebral/retropharyngeal edema has slightly increased from prior. 2. Abnormal inflammatory changes involving the left psoas muscle, concerning for acute infection with myositis. Superimposed 1.3 cm area of T2 signal intensity suspicious for phlegmon/early abscess formation. Fluid signal intensity within the L4-5 interspace is nonspecific, but could reflect changes of early discitis, and possibly be the source of the left psoas infection. 3.  Moderate facet degeneration at L4-5 with associated reactive marrow edema and enhancement. Findings are favored to be degenerative in nature, although attention at follow-up is recommended. 4. Irregular and partially loculated left pleural effusion. 5. Underlying diffusely abnormal marrow edema, stable from prior. Electronically Signed   By: Jeannine Boga M.D.   On: 08/13/2021 20:29   MR Lumbar Spine W Wo Contrast  Result Date: 08/13/2021 CLINICAL DATA:  Initial evaluation for acute severe lower back pain, sepsis, concern for infection. EXAM: MRI CERVICAL, THORACIC AND LUMBAR SPINE WITHOUT AND WITH CONTRAST TECHNIQUE: Multiplanar and multiecho pulse sequences of the cervical spine, to include the craniocervical  junction and cervicothoracic junction, and thoracic and lumbar spine, were obtained without and with intravenous contrast. CONTRAST:  26mL GADAVIST GADOBUTROL 1 MMOL/ML IV SOLN COMPARISON:  Comparison made with previous MRI from 08/12/2021 as well as prior CT from 08/08/2021. FINDINGS: MRI CERVICAL SPINE FINDINGS Alignment: Physiologic with preservation of the normal cervical lordosis. Vertebrae: Diffusely decreased T1 signal intensity seen throughout the visualized bone marrow, nonspecific, but most commonly related to anemia, smoking, or obesity. Fatty marrow conversion noted about the C5-6 interspace. No discrete or worrisome osseous lesions. There is increased STIR signal intensity with possible mild enhancement about the C5-6 interspace as compared to previous, suspicious for possible developing osteomyelitis no convincing evidence for acute discitis within the intervening C5-6 interspace at this time. No other abnormal marrow edema or enhancement to suggest acute infection elsewhere within the cervical spine. Cord: Normal signal and morphology. No epidural abscess or other collection. No abnormal enhancement. Posterior Fossa, vertebral arteries, paraspinal tissues: Prevertebral/retropharyngeal edema again seen extending from the skull base to the level of C5-6, slightly increased in prominence as compared to prior exam. No visible loculated soft tissue collection. Normal flow voids seen within the vertebral arteries bilaterally. Disc levels: No other new or progressive finding as compared to recent MRI. No high-grade spinal stenosis. MRI THORACIC SPINE FINDINGS Alignment: Physiologic with preservation of the normal thoracic kyphosis. No listhesis. Vertebrae: Diffusely decreased T1 signal intensity seen throughout the visualized bone marrow. No discrete or worrisome osseous lesions. No abnormal marrow edema or enhancement to suggest osteomyelitis discitis or septic arthritis. Cord: Normal signal and morphology.  No epidural abscess or other collection. No abnormal enhancement. Paraspinal and other soft tissues: Prominent paravertebral T2 hyperintense soft tissue again seen, likely reflecting prominent paravertebral fat. Extension into the subpleural spaces bilaterally. Superimposed irregular left pleural effusion which appears partially loculated. Multiple prominent venous collaterals noted throughout the visualized subcutaneous fat. Disc levels: No significant disc pathology.  No stenosis or neural impingement. MRI LUMBAR SPINE FINDINGS Segmentation: Standard. Lowest well-formed disc space labeled the L5-S1 level. Alignment: Trace anterolisthesis of L4 on L5, chronic and facet mediated. Alignment otherwise normal with preservation of the normal lumbar lordosis. Vertebrae: Diffusely decreased T1 signal intensity seen throughout the visualized bone marrow. No discrete or worrisome osseous lesions. Endplate Schmorl's node deformities with associated fatty marrow conversion seen about the L2 vertebral body. There is fluid signal intensity within the L4-5 disc space without discernible enhancement, nonspecific, but could reflect early changes of acute discitis. No overt evidence for associated osteomyelitis within the adjacent vertebral bodies. Marrow edema and enhancement seen about the L4-5 facets bilaterally, favored to be degenerative in nature. No other abnormal marrow edema or enhancement. Conus medullaris and cauda equina: Conus extends to the L1-2 level. Conus and cauda equina appear normal. No epidural abscess or other collection. No abnormal enhancement. Paraspinal and other soft tissues:  There is abnormal edema and enhancement involving the left psoas muscle, extending from the level of L3 inferiorly (series 41, image 35). Findings concerning for acute infection. Superimposed 1.3 cm area of T2 signal intensity suspicious for phlegmon/early abscess formation (series 34, image 38). No other loculated soft tissue  collections. Multiple prominent venous collaterals noted throughout the subcutaneous fat. 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 Disc levels: L1-2: Endplate Schmorl's node deformity without significant disc bulge. Mild facet hypertrophy. No stenosis. L2-3: Mild disc bulge with endplate Schmorl's node deformity. No spinal stenosis. Mild right L2 foraminal narrowing. L3-4: Mild disc bulge. Mild bilateral facet hypertrophy. No significant spinal stenosis. Mild right L3 foraminal narrowing. L4-5: Disc bulge with reactive endplate change. Nonspecific fluid signal intensity again noted within the L4-5 interspace. Moderate bilateral facet hypertrophy with associated reactive marrow edema and enhancement and small joint effusions. Resultant mild canal with mild to moderate left greater than right lateral recess stenosis. Mild right with moderate left L4 foraminal narrowing. L5-S1: Mild disc bulge with reactive endplate spurring. No significant spinal stenosis. Mild bilateral L5 foraminal narrowing. IMPRESSION: 1. Interval development of marrow edema about the C5-6 interspace since recent exam, concerning for possible developing osteomyelitis. Adjacent prevertebral/retropharyngeal edema has slightly increased from prior. 2. Abnormal inflammatory changes involving the left psoas muscle, concerning for acute infection with myositis. Superimposed 1.3 cm area of T2 signal intensity suspicious for phlegmon/early abscess formation. Fluid signal intensity within the L4-5 interspace is nonspecific, but could reflect changes of early discitis, and possibly be the source of the left psoas infection. 3. Moderate facet degeneration at L4-5 with associated reactive marrow edema and enhancement. Findings are favored to be degenerative in nature, although attention at follow-up is recommended. 4. Irregular and partially loculated left pleural effusion. 5. Underlying diffusely abnormal marrow edema, stable  from prior. Electronically Signed   By: Jeannine Boga M.D.   On: 08/13/2021 20:29     Scheduled Meds:  apixaban  2.5 mg Oral BID   calcium acetate  2,668 mg Oral TID WC   Chlorhexidine Gluconate Cloth  6 each Topical Q0600   docusate sodium  100 mg Oral BID   epoetin (EPOGEN/PROCRIT) injection  4,000 Units Intravenous Q M,W,F-HD   feeding supplement (NEPRO CARB STEADY)  237 mL Oral TID BM   lidocaine  2 patch Transdermal Q24H   nicotine  21 mg Transdermal Daily   senna-docusate  1 tablet Oral BID   Continuous Infusions:  ampicillin (OMNIPEN) IV 2 g (08/15/21 0526)   cefTRIAXone (ROCEPHIN)  IV 2 g (08/15/21 1031)   sodium chloride     sodium chloride       LOS: 6 days     Enzo Bi, MD [facility] If 7PM-7AM, please contact night-coverage 08/15/2021, 3:26 PM

## 2021-08-15 NOTE — Progress Notes (Signed)
Occupational Therapy Treatment Patient Details Name: [name] [name] MRN: [id number] DOB: 07-Aug-1961 Today's Date: 08/15/2021    History of present illness Pt is a 59 y.o. male with medical history significant of ESRD-HD (MWF), hypertension, tobacco abuse, polysubstance abuse, diverticulitis, anemia, jugular vein thrombosis, GI bleeding, right pneumothorax, who presents with lower back pain. 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   OT comments  [name] [name] requested to transfer from bed to [facility]. He required Min-Mod A for bed mobility to come into EOB sitting. Despite repeated attempts, pt was only able to come into standing briefly with Max A and RW. At that point his pain would shoot up to 10/10 and he would drop back down onto the bed. He was able to maintain standing balance for only a few seconds, and was unable to perform stand-pivot transfer to [facility] with Max A +2. Pt eventually requested to use bed pan. Continue to offer hospital-based OT services, with DC to SNF setting to assist pt in returning to PLOF.   Follow Up Recommendations  SNF    Equipment Recommendations  None recommended by OT    Recommendations for Other Services      Precautions / Restrictions Precautions Precautions: Fall Restrictions Weight Bearing Restrictions: No       Mobility Bed Mobility Overal bed mobility: Needs Assistance Bed Mobility: Rolling;Supine to Sit;Sit to Supine Rolling: Min assist Sidelying to sit: Mod assist     Sit to sidelying: Min guard General bed mobility comments: Mod A with rolling and during sidelying to/from sit for BLE and trunk control    Transfers Overall transfer level: Needs assistance Equipment used: Rolling walker (2 wheeled) Transfers: Sit to/from Stand Sit to Stand: Max assist;+2 physical assistance          General transfer comment: Max A + 2 to come into standing    Balance Overall balance assessment: Needs assistance   Sitting balance-Leahy Scale: Fair     Standing balance support: Bilateral upper extremity supported;During functional activity Standing balance-Leahy Scale: Poor Standing balance comment: Mod A for stability                           ADL either performed or assessed with clinical judgement   ADL Overall ADL's : Needs assistance/impaired                     Lower Body Dressing: +2 for physical assistance;Maximal assistance               Functional mobility during ADLs: Maximal assistance       Vision       Perception     Praxis      Cognition Arousal/Alertness: Awake/alert Behavior During Therapy: WFL for tasks assessed/performed Overall Cognitive Status: No family/caregiver present to determine baseline cognitive functioning                                          Exercises Total Joint Exercises Ankle Circles/Pumps: AROM;Strengthening;Both;10 reps Hip ABduction/ADduction: AAROM;Strengthening;Both;10 reps Long Arc Quad: AROM;Strengthening;Both;10 reps Knee Flexion: AROM;Strengthening;Both;10 reps Other Exercises Other Exercises: Gentle low amplitude rolling left/right with cues for sequecing Other Exercises: Static unsupported sitting at EOB, to improve sitting balance tolerance  and in preparation for transfer to [facility]   Shoulder Instructions       General Comments      Pertinent Vitals/ Pain       Pain Assessment: 0-10 Pain Score: 10-Worst pain ever Pain Location: 7/10 at rest, 10/10 with movement Pain Descriptors / Indicators: Aching;Sore;Moaning;Grimacing Pain Intervention(s): Limited activity within patient's tolerance;Repositioned;Monitored during session  Home Living                                          Prior Functioning/Environment              Frequency  Min  1X/week        Progress Toward Goals  OT Goals(current goals can now be found in the care plan section)  Progress towards OT goals: Progressing toward goals  Acute Rehab OT Goals Patient Stated Goal: to have less pain OT Goal Formulation: With patient Time For Goal Achievement: 08/23/21 Potential to Achieve Goals: [place] Frequency remains appropriate    Co-evaluation                 AM-PAC OT "6 Clicks" Daily Activity     Outcome Measure   Help from another person eating meals?: A Little Help from another person taking care of personal grooming?: A Little Help from another person toileting, which includes using toliet, bedpan, or urinal?: A Lot Help from another person bathing (including washing, rinsing, drying)?: A Lot Help from another person to put on and taking off regular upper body clothing?: A Lot Help from another person to put on and taking off regular lower body clothing?: A Lot 6 Click Score: 14    End of Session    OT Visit Diagnosis: Muscle weakness (generalized) (M62.81);Pain   Activity Tolerance Patient limited by pain   Patient Left in bed;with call bell/phone within reach;with bed alarm set;with nursing/sitter in room   Nurse Communication          Time: 2010-0712 OT Time Calculation (min): 10 min  Charges: OT General Charges $OT Visit: 1 Visit OT Treatments $Self Care/Home Management : 8-22 mins  Josiah Lobo, PhD, MS, OTR/L 08/15/21, 3:59 PM

## 2021-08-15 NOTE — Consult Note (Signed)
[name] [name] is a 60 y.o. male  [id number]  Primary Cardiologist: Neoma Laming Reason for Consultation: Sepsis  HPI: This is a 60 year old African-American male who presented to the hospital with back pain and apparently infectious disease wants to rule out endocarditis as may have osteomyelitis.   Review of Systems: No chest pain   Past Medical History:  Diagnosis Date   Anemia of chronic disease    BL Hgb ~10   CHF (congestive heart failure) ([place])    pt denies 04/01/14   Diverticulitis    End-stage renal disease on hemodialysis (HCC)    HD m/w/f - right arm graft   History of blood transfusion    Hypertension    Pneumonia    hx of   Pneumonia    Polysubstance abuse ([place])    Renal insufficiency    Secondary hyperparathyroidism ([place])    Sleep disturbance    Tobacco abuse     Medications Prior to Admission  Medication Sig Dispense Refill   acetaminophen (TYLENOL) 500 MG tablet Take 1,000 mg by mouth every 8 (eight) hours as needed for moderate pain.     amLODipine (NORVASC) 10 MG tablet Take 10 mg by mouth every evening.     apixaban (ELIQUIS) 5 MG TABS tablet Take 5 mg by mouth daily.     calcium acetate (PHOSLO) 667 MG capsule Take 2,668 mg by mouth 3 (three) times daily with meals.      promethazine (PHENERGAN) 25 MG tablet Take 25 mg by mouth every 8 (eight) hours as needed for nausea/vomiting.        apixaban  2.5 mg Oral BID   calcium acetate  2,668 mg Oral TID WC   Chlorhexidine Gluconate Cloth  6 each Topical Q0600   docusate sodium  100 mg Oral BID   epoetin (EPOGEN/PROCRIT) injection  4,000 Units Intravenous Q M,W,F-HD   feeding supplement (NEPRO CARB STEADY)  237 mL Oral TID BM   lidocaine  2 patch Transdermal Q24H   nicotine  21 mg Transdermal Daily   senna-docusate  1 tablet Oral BID    Infusions:  ampicillin (OMNIPEN) IV 2 g (08/15/21 0526)   cefTRIAXone (ROCEPHIN)  IV 2 g (08/15/21 1031)   sodium chloride     sodium chloride       Allergies  Allergen Reactions   Other     Other reaction(s): Other (See Comments) blister   Tape Other (See Comments)    Plastic Tape Causes blister    Social History   Socioeconomic History   Marital status: Single    Spouse name: Not on file   Number of children: 2   Years of education: 97   Highest education level: Not on file  Occupational History   Occupation: disabled    Comment: previously drove trucks  Tobacco Use   Smoking status: Every Day    Packs/day: 6.00    Years: 20.00    Pack years: 120.00    Types: Cigarettes   Smokeless tobacco: Current  Vaping Use   Vaping Use: Never used  Substance and Sexual Activity   Alcohol use: Yes    Alcohol/week: 3.0 standard drinks    Types: 3 Cans of beer per week    Comment: occasional   Drug use: Yes    Types: Marijuana, Cocaine    Comment: remote cocaine abuse, ongoing marijuana use.   Sexual activity: Not on file  Other Topics Concern   Not on file  Social  History Narrative   Not on file   Social Determinants of Health   Financial Resource Strain: Not on file  Food Insecurity: Not on file  Transportation Needs: Not on file  Physical Activity: Not on file  Stress: Not on file  Social Connections: Not on file  Intimate Partner Violence: Not on file    Family History  Problem Relation Age of Onset   Thyroid disease Mother    Renal Disease Maternal Grandmother    Diabetes Maternal Grandmother    Hypertension Maternal Aunt    Hypertension Maternal Uncle     PHYSICAL EXAM: Vitals:   08/15/21 0438 08/15/21 0753  BP: 137/70 (!) 144/67  Pulse: (!) 109 (!) 109  Resp: 18 18  Temp: 98.2 F (36.8 C) 98.2 F (36.8 C)  SpO2: 100% 95%     Intake/Output Summary (Last 24 hours) at 08/15/2021 1354 Last data filed at 08/14/2021 1600 Gross per 24 hour  Intake 100 ml  Output --  Net 100 ml    General:  Well appearing. No respiratory difficulty HEENT: normal Neck: supple. no JVD. Carotids 2+ bilat; no  bruits. No lymphadenopathy or thryomegaly appreciated. Cor: PMI nondisplaced. Regular rate & rhythm. No rubs, gallops or murmurs. Lungs: clear Abdomen: soft, nontender, nondistended. No hepatosplenomegaly. No bruits or masses. Good bowel sounds. Extremities: no cyanosis, clubbing, rash, edema Neuro: alert & oriented x 3, cranial nerves grossly intact. moves all 4 extremities w/o difficulty. Affect pleasant.  ECG: Normal sinus rhythm no acute changes  Results for orders placed or performed during the hospital encounter of 08/08/21 (from the past 24 hour(s))  Glucose, capillary     Status: Abnormal   Collection Time: 08/14/21  4:39 PM  Result Value Ref Range   Glucose-Capillary 346 (H) 70 - 99 mg/dL   Comment 1 Notify RN   Basic metabolic panel     Status: Abnormal   Collection Time: 08/15/21  4:46 AM  Result Value Ref Range   Sodium 136 135 - 145 mmol/L   Potassium 4.0 3.5 - 5.1 mmol/L   Chloride 96 (L) 98 - 111 mmol/L   CO2 29 22 - 32 mmol/L   Glucose, Bld 102 (H) 70 - 99 mg/dL   BUN 46 (H) 6 - 20 mg/dL   Creatinine, Ser 7.01 (H) 0.61 - 1.24 mg/dL   Calcium 10.1 8.9 - 10.3 mg/dL   GFR, Estimated 8 (L) >60 mL/min   Anion gap 11 5 - 15  CBC     Status: Abnormal   Collection Time: 08/15/21  4:46 AM  Result Value Ref Range   WBC 8.5 4.0 - 10.5 K/uL   RBC 2.81 (L) 4.22 - 5.81 MIL/uL   Hemoglobin 8.7 (L) 13.0 - 17.0 g/dL   HCT 27.3 (L) 39.0 - 52.0 %   MCV 97.2 80.0 - 100.0 fL   MCH 31.0 26.0 - 34.0 pg   MCHC 31.9 30.0 - 36.0 g/dL   RDW 16.4 (H) 11.5 - 15.5 %   Platelets 179 150 - 400 K/uL   nRBC 0.0 0.0 - 0.2 %  Magnesium     Status: None   Collection Time: 08/15/21  4:46 AM  Result Value Ref Range   Magnesium 1.9 1.7 - 2.4 mg/dL   MR CERVICAL SPINE W WO CONTRAST  Result Date: 08/13/2021 CLINICAL DATA:  Initial evaluation for acute severe lower back pain, sepsis, concern for infection. EXAM: MRI CERVICAL, THORACIC AND LUMBAR SPINE WITHOUT AND WITH CONTRAST TECHNIQUE:  Multiplanar and  multiecho pulse sequences of the cervical spine, to include the craniocervical junction and cervicothoracic junction, and thoracic and lumbar spine, were obtained without and with intravenous contrast. CONTRAST:  74mL GADAVIST GADOBUTROL 1 MMOL/ML IV SOLN COMPARISON:  Comparison made with previous MRI from 08/12/2021 as well as prior CT from 08/08/2021. FINDINGS: MRI CERVICAL SPINE FINDINGS Alignment: Physiologic with preservation of the normal cervical lordosis. Vertebrae: Diffusely decreased T1 signal intensity seen throughout the visualized bone marrow, nonspecific, but most commonly related to anemia, smoking, or obesity. Fatty marrow conversion noted about the C5-6 interspace. No discrete or worrisome osseous lesions. There is increased STIR signal intensity with possible mild enhancement about the C5-6 interspace as compared to previous, suspicious for possible developing osteomyelitis no convincing evidence for acute discitis within the intervening C5-6 interspace at this time. No other abnormal marrow edema or enhancement to suggest acute infection elsewhere within the cervical spine. Cord: Normal signal and morphology. No epidural abscess or other collection. No abnormal enhancement. Posterior Fossa, vertebral arteries, paraspinal tissues: Prevertebral/retropharyngeal edema again seen extending from the skull base to the level of C5-6, slightly increased in prominence as compared to prior exam. No visible loculated soft tissue collection. Normal flow voids seen within the vertebral arteries bilaterally. Disc levels: No other new or progressive finding as compared to recent MRI. No high-grade spinal stenosis. MRI THORACIC SPINE FINDINGS Alignment: Physiologic with preservation of the normal thoracic kyphosis. No listhesis. Vertebrae: Diffusely decreased T1 signal intensity seen throughout the visualized bone marrow. No discrete or worrisome osseous lesions. No abnormal marrow edema or  enhancement to suggest osteomyelitis discitis or septic arthritis. Cord: Normal signal and morphology. No epidural abscess or other collection. No abnormal enhancement. Paraspinal and other soft tissues: Prominent paravertebral T2 hyperintense soft tissue again seen, likely reflecting prominent paravertebral fat. Extension into the subpleural spaces bilaterally. Superimposed irregular left pleural effusion which appears partially loculated. Multiple prominent venous collaterals noted throughout the visualized subcutaneous fat. Disc levels: No significant disc pathology.  No stenosis or neural impingement. MRI LUMBAR SPINE FINDINGS Segmentation: Standard. Lowest well-formed disc space labeled the L5-S1 level. Alignment: Trace anterolisthesis of L4 on L5, chronic and facet mediated. Alignment otherwise normal with preservation of the normal lumbar lordosis. Vertebrae: Diffusely decreased T1 signal intensity seen throughout the visualized bone marrow. No discrete or worrisome osseous lesions. Endplate Schmorl's node deformities with associated fatty marrow conversion seen about the L2 vertebral body. There is fluid signal intensity within the L4-5 disc space without discernible enhancement, nonspecific, but could reflect early changes of acute discitis. No overt evidence for associated osteomyelitis within the adjacent vertebral bodies. Marrow edema and enhancement seen about the L4-5 facets bilaterally, favored to be degenerative in nature. No other abnormal marrow edema or enhancement. Conus medullaris and cauda equina: Conus extends to the L1-2 level. Conus and cauda equina appear normal. No epidural abscess or other collection. No abnormal enhancement. Paraspinal and other soft tissues: There is abnormal edema and enhancement involving the left psoas muscle, extending from the level of L3 inferiorly (series 41, image 35). Findings concerning for acute infection. Superimposed 1.3 cm area of T2 signal intensity  suspicious for phlegmon/early abscess formation (series 34, image 38). No other loculated soft tissue collections. Multiple prominent venous collaterals noted throughout the subcutaneous fat. 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 Disc levels: L1-2: Endplate Schmorl's node deformity without significant disc bulge. Mild facet hypertrophy. No stenosis. L2-3: Mild disc bulge with endplate Schmorl's node deformity. No spinal stenosis. Mild right L2  foraminal narrowing. L3-4: Mild disc bulge. Mild bilateral facet hypertrophy. No significant spinal stenosis. Mild right L3 foraminal narrowing. L4-5: Disc bulge with reactive endplate change. Nonspecific fluid signal intensity again noted within the L4-5 interspace. Moderate bilateral facet hypertrophy with associated reactive marrow edema and enhancement and small joint effusions. Resultant mild canal with mild to moderate left greater than right lateral recess stenosis. Mild right with moderate left L4 foraminal narrowing. L5-S1: Mild disc bulge with reactive endplate spurring. No significant spinal stenosis. Mild bilateral L5 foraminal narrowing. IMPRESSION: 1. Interval development of marrow edema about the C5-6 interspace since recent exam, concerning for possible developing osteomyelitis. Adjacent prevertebral/retropharyngeal edema has slightly increased from prior. 2. Abnormal inflammatory changes involving the left psoas muscle, concerning for acute infection with myositis. Superimposed 1.3 cm area of T2 signal intensity suspicious for phlegmon/early abscess formation. Fluid signal intensity within the L4-5 interspace is nonspecific, but could reflect changes of early discitis, and possibly be the source of the left psoas infection. 3. Moderate facet degeneration at L4-5 with associated reactive marrow edema and enhancement. Findings are favored to be degenerative in nature, although attention at follow-up is recommended. 4. Irregular  and partially loculated left pleural effusion. 5. Underlying diffusely abnormal marrow edema, stable from prior. Electronically Signed   By: Jeannine Boga M.D.   On: 08/13/2021 20:29   MR THORACIC SPINE W WO CONTRAST  Result Date: 08/13/2021 CLINICAL DATA:  Initial evaluation for acute severe lower back pain, sepsis, concern for infection. EXAM: MRI CERVICAL, THORACIC AND LUMBAR SPINE WITHOUT AND WITH CONTRAST TECHNIQUE: Multiplanar and multiecho pulse sequences of the cervical spine, to include the craniocervical junction and cervicothoracic junction, and thoracic and lumbar spine, were obtained without and with intravenous contrast. CONTRAST:  71mL GADAVIST GADOBUTROL 1 MMOL/ML IV SOLN COMPARISON:  Comparison made with previous MRI from 08/12/2021 as well as prior CT from 08/08/2021. FINDINGS: MRI CERVICAL SPINE FINDINGS Alignment: Physiologic with preservation of the normal cervical lordosis. Vertebrae: Diffusely decreased T1 signal intensity seen throughout the visualized bone marrow, nonspecific, but most commonly related to anemia, smoking, or obesity. Fatty marrow conversion noted about the C5-6 interspace. No discrete or worrisome osseous lesions. There is increased STIR signal intensity with possible mild enhancement about the C5-6 interspace as compared to previous, suspicious for possible developing osteomyelitis no convincing evidence for acute discitis within the intervening C5-6 interspace at this time. No other abnormal marrow edema or enhancement to suggest acute infection elsewhere within the cervical spine. Cord: Normal signal and morphology. No epidural abscess or other collection. No abnormal enhancement. Posterior Fossa, vertebral arteries, paraspinal tissues: Prevertebral/retropharyngeal edema again seen extending from the skull base to the level of C5-6, slightly increased in prominence as compared to prior exam. No visible loculated soft tissue collection. Normal flow voids seen  within the vertebral arteries bilaterally. Disc levels: No other new or progressive finding as compared to recent MRI. No high-grade spinal stenosis. MRI THORACIC SPINE FINDINGS Alignment: Physiologic with preservation of the normal thoracic kyphosis. No listhesis. Vertebrae: Diffusely decreased T1 signal intensity seen throughout the visualized bone marrow. No discrete or worrisome osseous lesions. No abnormal marrow edema or enhancement to suggest osteomyelitis discitis or septic arthritis. Cord: Normal signal and morphology. No epidural abscess or other collection. No abnormal enhancement. Paraspinal and other soft tissues: Prominent paravertebral T2 hyperintense soft tissue again seen, likely reflecting prominent paravertebral fat. Extension into the subpleural spaces bilaterally. Superimposed irregular left pleural effusion which appears partially loculated. Multiple prominent venous collaterals noted  throughout the visualized subcutaneous fat. Disc levels: No significant disc pathology.  No stenosis or neural impingement. MRI LUMBAR SPINE FINDINGS Segmentation: Standard. Lowest well-formed disc space labeled the L5-S1 level. Alignment: Trace anterolisthesis of L4 on L5, chronic and facet mediated. Alignment otherwise normal with preservation of the normal lumbar lordosis. Vertebrae: Diffusely decreased T1 signal intensity seen throughout the visualized bone marrow. No discrete or worrisome osseous lesions. Endplate Schmorl's node deformities with associated fatty marrow conversion seen about the L2 vertebral body. There is fluid signal intensity within the L4-5 disc space without discernible enhancement, nonspecific, but could reflect early changes of acute discitis. No overt evidence for associated osteomyelitis within the adjacent vertebral bodies. Marrow edema and enhancement seen about the L4-5 facets bilaterally, favored to be degenerative in nature. No other abnormal marrow edema or enhancement. Conus  medullaris and cauda equina: Conus extends to the L1-2 level. Conus and cauda equina appear normal. No epidural abscess or other collection. No abnormal enhancement. Paraspinal and other soft tissues: There is abnormal edema and enhancement involving the left psoas muscle, extending from the level of L3 inferiorly (series 41, image 35). Findings concerning for acute infection. Superimposed 1.3 cm area of T2 signal intensity suspicious for phlegmon/early abscess formation (series 34, image 38). No other loculated soft tissue collections. Multiple prominent venous collaterals noted throughout the subcutaneous fat. 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 Disc levels: L1-2: Endplate Schmorl's node deformity without significant disc bulge. Mild facet hypertrophy. No stenosis. L2-3: Mild disc bulge with endplate Schmorl's node deformity. No spinal stenosis. Mild right L2 foraminal narrowing. L3-4: Mild disc bulge. Mild bilateral facet hypertrophy. No significant spinal stenosis. Mild right L3 foraminal narrowing. L4-5: Disc bulge with reactive endplate change. Nonspecific fluid signal intensity again noted within the L4-5 interspace. Moderate bilateral facet hypertrophy with associated reactive marrow edema and enhancement and small joint effusions. Resultant mild canal with mild to moderate left greater than right lateral recess stenosis. Mild right with moderate left L4 foraminal narrowing. L5-S1: Mild disc bulge with reactive endplate spurring. No significant spinal stenosis. Mild bilateral L5 foraminal narrowing. IMPRESSION: 1. Interval development of marrow edema about the C5-6 interspace since recent exam, concerning for possible developing osteomyelitis. Adjacent prevertebral/retropharyngeal edema has slightly increased from prior. 2. Abnormal inflammatory changes involving the left psoas muscle, concerning for acute infection with myositis. Superimposed 1.3 cm area of T2 signal  intensity suspicious for phlegmon/early abscess formation. Fluid signal intensity within the L4-5 interspace is nonspecific, but could reflect changes of early discitis, and possibly be the source of the left psoas infection. 3. Moderate facet degeneration at L4-5 with associated reactive marrow edema and enhancement. Findings are favored to be degenerative in nature, although attention at follow-up is recommended. 4. Irregular and partially loculated left pleural effusion. 5. Underlying diffusely abnormal marrow edema, stable from prior. Electronically Signed   By: Jeannine Boga M.D.   On: 08/13/2021 20:29   MR Lumbar Spine W Wo Contrast  Result Date: 08/13/2021 CLINICAL DATA:  Initial evaluation for acute severe lower back pain, sepsis, concern for infection. EXAM: MRI CERVICAL, THORACIC AND LUMBAR SPINE WITHOUT AND WITH CONTRAST TECHNIQUE: Multiplanar and multiecho pulse sequences of the cervical spine, to include the craniocervical junction and cervicothoracic junction, and thoracic and lumbar spine, were obtained without and with intravenous contrast. CONTRAST:  22mL GADAVIST GADOBUTROL 1 MMOL/ML IV SOLN COMPARISON:  Comparison made with previous MRI from 08/12/2021 as well as prior CT from 08/08/2021. FINDINGS: MRI CERVICAL SPINE FINDINGS Alignment:  Physiologic with preservation of the normal cervical lordosis. Vertebrae: Diffusely decreased T1 signal intensity seen throughout the visualized bone marrow, nonspecific, but most commonly related to anemia, smoking, or obesity. Fatty marrow conversion noted about the C5-6 interspace. No discrete or worrisome osseous lesions. There is increased STIR signal intensity with possible mild enhancement about the C5-6 interspace as compared to previous, suspicious for possible developing osteomyelitis no convincing evidence for acute discitis within the intervening C5-6 interspace at this time. No other abnormal marrow edema or enhancement to suggest acute  infection elsewhere within the cervical spine. Cord: Normal signal and morphology. No epidural abscess or other collection. No abnormal enhancement. Posterior Fossa, vertebral arteries, paraspinal tissues: Prevertebral/retropharyngeal edema again seen extending from the skull base to the level of C5-6, slightly increased in prominence as compared to prior exam. No visible loculated soft tissue collection. Normal flow voids seen within the vertebral arteries bilaterally. Disc levels: No other new or progressive finding as compared to recent MRI. No high-grade spinal stenosis. MRI THORACIC SPINE FINDINGS Alignment: Physiologic with preservation of the normal thoracic kyphosis. No listhesis. Vertebrae: Diffusely decreased T1 signal intensity seen throughout the visualized bone marrow. No discrete or worrisome osseous lesions. No abnormal marrow edema or enhancement to suggest osteomyelitis discitis or septic arthritis. Cord: Normal signal and morphology. No epidural abscess or other collection. No abnormal enhancement. Paraspinal and other soft tissues: Prominent paravertebral T2 hyperintense soft tissue again seen, likely reflecting prominent paravertebral fat. Extension into the subpleural spaces bilaterally. Superimposed irregular left pleural effusion which appears partially loculated. Multiple prominent venous collaterals noted throughout the visualized subcutaneous fat. Disc levels: No significant disc pathology.  No stenosis or neural impingement. MRI LUMBAR SPINE FINDINGS Segmentation: Standard. Lowest well-formed disc space labeled the L5-S1 level. Alignment: Trace anterolisthesis of L4 on L5, chronic and facet mediated. Alignment otherwise normal with preservation of the normal lumbar lordosis. Vertebrae: Diffusely decreased T1 signal intensity seen throughout the visualized bone marrow. No discrete or worrisome osseous lesions. Endplate Schmorl's node deformities with associated fatty marrow conversion seen  about the L2 vertebral body. There is fluid signal intensity within the L4-5 disc space without discernible enhancement, nonspecific, but could reflect early changes of acute discitis. No overt evidence for associated osteomyelitis within the adjacent vertebral bodies. Marrow edema and enhancement seen about the L4-5 facets bilaterally, favored to be degenerative in nature. No other abnormal marrow edema or enhancement. Conus medullaris and cauda equina: Conus extends to the L1-2 level. Conus and cauda equina appear normal. No epidural abscess or other collection. No abnormal enhancement. Paraspinal and other soft tissues: There is abnormal edema and enhancement involving the left psoas muscle, extending from the level of L3 inferiorly (series 41, image 35). Findings concerning for acute infection. Superimposed 1.3 cm area of T2 signal intensity suspicious for phlegmon/early abscess formation (series 34, image 38). No other loculated soft tissue collections. Multiple prominent venous collaterals noted throughout the subcutaneous fat. 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 Disc levels: L1-2: Endplate Schmorl's node deformity without significant disc bulge. Mild facet hypertrophy. No stenosis. L2-3: Mild disc bulge with endplate Schmorl's node deformity. No spinal stenosis. Mild right L2 foraminal narrowing. L3-4: Mild disc bulge. Mild bilateral facet hypertrophy. No significant spinal stenosis. Mild right L3 foraminal narrowing. L4-5: Disc bulge with reactive endplate change. Nonspecific fluid signal intensity again noted within the L4-5 interspace. Moderate bilateral facet hypertrophy with associated reactive marrow edema and enhancement and small joint effusions. Resultant mild canal with mild to moderate  left greater than right lateral recess stenosis. Mild right with moderate left L4 foraminal narrowing. L5-S1: Mild disc bulge with reactive endplate spurring. No significant spinal  stenosis. Mild bilateral L5 foraminal narrowing. IMPRESSION: 1. Interval development of marrow edema about the C5-6 interspace since recent exam, concerning for possible developing osteomyelitis. Adjacent prevertebral/retropharyngeal edema has slightly increased from prior. 2. Abnormal inflammatory changes involving the left psoas muscle, concerning for acute infection with myositis. Superimposed 1.3 cm area of T2 signal intensity suspicious for phlegmon/early abscess formation. Fluid signal intensity within the L4-5 interspace is nonspecific, but could reflect changes of early discitis, and possibly be the source of the left psoas infection. 3. Moderate facet degeneration at L4-5 with associated reactive marrow edema and enhancement. Findings are favored to be degenerative in nature, although attention at follow-up is recommended. 4. Irregular and partially loculated left pleural effusion. 5. Underlying diffusely abnormal marrow edema, stable from prior. Electronically Signed   By: Jeannine Boga M.D.   On: 08/13/2021 20:29     ASSESSMENT AND PLAN: Rule out endocarditis by doing transesophageal echocardiogram.  Patient was explained risk and benefits and patient agreed to the procedure.  Technologist is busy, and will do the TEE on Thursday at [facility]

## 2021-08-15 NOTE — Progress Notes (Signed)
[facility]  ROUNDING NOTE   Subjective:   [name] [name] is a 60 y.o. male with medical conditions including anemia, CHF, hypertension, and ESRD on dialysis. Patient has presented to ED with progressive back pain.   Patient states he has always had back pain but it has increased in the past few days. Denies known precipitating factors. Chest X-ray of lumbar spine shows multilevel degenerative changes and stenosis, greatest at L4-L5.   Patient is known to our practice and receives dialysis at [facility] supervised by Dr Juleen China. We have been consulted to manage dialysis treatments.   Patient seen laying in bed Alert Complains of back pain But appears more comfortable    Objective:  Vital signs in last 24 hours:  Temp:  [97.7 F (36.5 C)-98.9 F (37.2 C)] 98.2 F (36.8 C) (09/06 0753) Pulse Rate:  [54-109] 109 (09/06 0753) Resp:  [12-21] 18 (09/06 0753) BP: (86-144)/(46-96) 144/67 (09/06 0753) SpO2:  [94 %-100 %] 95 % (09/06 0753)  Weight change:  Filed Weights   08/08/21 0625  Weight: 91 kg    Intake/Output: I/O last 3 completed shifts: In: 100 [IV Piggyback:100] Out: 1500 [Other:1500]   Intake/Output this shift:  No intake/output data recorded.  Physical Exam: General: NAD, resting in bed  Head: Normocephalic, atraumatic. Moist oral mucosal membranes  Eyes: Anicteric  Lungs:  Clear to auscultation, normal effort  Heart: Regular rate and rhythm  Abdomen:  Soft, nontender, distended  Extremities: Mild right upper extremity swelling  Neurologic: Alert, moving all four extremities  Skin: No lesions  Access: Rt AVG, swelling noted in right upper extremity    Basic Metabolic Panel: Recent Labs  Lab 08/11/21 0226 08/12/21 0613 08/13/21 0827 08/14/21 0614 08/15/21 0446  NA 136 138 136 135 136  K 5.0 4.9 4.6 4.8 4.0  CL 99 98 97* 96* 96*  CO2 28 28 29 28 29   GLUCOSE 89 94 90 111* 102*  BUN 65* 56* 55* 65* 46*  CREATININE 9.61* 8.52*  7.92* 9.28* 7.01*  CALCIUM 9.9 9.6 10.0 10.0 10.1  MG 2.2 2.0 1.9 2.0 1.9  PHOS 7.6*  --   --   --   --      Liver Function Tests: No results for input(s): AST, ALT, ALKPHOS, BILITOT, PROT, ALBUMIN in the last 168 hours.  No results for input(s): LIPASE, AMYLASE in the last 168 hours. No results for input(s): AMMONIA in the last 168 hours.  CBC: Recent Labs  Lab 08/11/21 0226 08/12/21 0613 08/13/21 0827 08/14/21 0614 08/15/21 0446  WBC 8.5 9.6 8.8 8.2 8.5  HGB 9.9* 9.2* 8.4* 8.6* 8.7*  HCT 31.1* 30.2* 26.9* 28.7* 27.3*  MCV 96.0 96.2 97.8 96.0 97.2  PLT 145* 143* 138* 180 179     Cardiac Enzymes: No results for input(s): CKTOTAL, CKMB, CKMBINDEX, TROPONINI in the last 168 hours.  BNP: Invalid input(s): POCBNP  CBG: Recent Labs  Lab 08/14/21 0742 08/14/21 1639  GLUCAP 93 346*     Microbiology: Results for orders placed or performed during the hospital encounter of 08/08/21  Resp Panel by RT-PCR (Flu A&B, Covid) Nasopharyngeal Swab     Status: None   Collection Time: 08/08/21  1:22 PM   Specimen: Nasopharyngeal Swab; Nasopharyngeal(NP) swabs in vial transport medium  Result Value Ref Range Status   SARS Coronavirus 2 by RT PCR NEGATIVE NEGATIVE Final    Comment: (NOTE) SARS-CoV-2 target nucleic acids are NOT DETECTED.  The SARS-CoV-2 RNA is generally detectable  in upper respiratory specimens during the acute phase of infection. The lowest concentration of SARS-CoV-2 viral copies this assay can detect is 138 copies/mL. A negative result does not preclude SARS-Cov-2 infection and should not be used as the sole basis for treatment or other patient management decisions. A negative result may occur with  improper specimen collection/handling, submission of specimen other than nasopharyngeal swab, presence of viral mutation(s) within the areas targeted by this assay, and inadequate number of viral copies(<138 copies/mL). A negative result must be combined  with clinical observations, patient history, and epidemiological information. The expected result is Negative.  Fact Sheet for Patients:  EntrepreneurPulse.com.au  Fact Sheet for Healthcare Providers:  IncredibleEmployment.be  This test is no t yet approved or cleared by the [place] FDA and  has been authorized for detection and/or diagnosis of SARS-CoV-2 by FDA under an Emergency Use Authorization (EUA). This EUA will remain  in effect (meaning this test can be used) for the duration of the COVID-19 declaration under Section 564(b)(1) of the Act, 21 U.S.C.section 360bbb-3(b)(1), unless the authorization is terminated  or revoked sooner.       Influenza A by PCR NEGATIVE NEGATIVE Final   Influenza B by PCR NEGATIVE NEGATIVE Final    Comment: (NOTE) The Xpert Xpress SARS-CoV-2/FLU/RSV plus assay is intended as an aid in the diagnosis of influenza from Nasopharyngeal swab specimens and should not be used as a sole basis for treatment. Nasal washings and aspirates are unacceptable for Xpert Xpress SARS-CoV-2/FLU/RSV testing.  Fact Sheet for Patients: EntrepreneurPulse.com.au  Fact Sheet for Healthcare Providers: IncredibleEmployment.be  This test is not yet approved or cleared by the [place] FDA and has been authorized for detection and/or diagnosis of SARS-CoV-2 by FDA under an Emergency Use Authorization (EUA). This EUA will remain in effect (meaning this test can be used) for the duration of the COVID-19 declaration under Section 564(b)(1) of the Act, 21 U.S.C. section 360bbb-3(b)(1), unless the authorization is terminated or revoked.  Performed at [facility], [address] [place], [place] [address]   Culture, blood (Routine X 2) w Reflex to ID Panel     Status: Abnormal   Collection Time: 08/11/21  5:47 PM   Specimen: BLOOD  Result Value Ref Range Status   Specimen  Description   Final    BLOOD LEFT HAND Performed at [facility], [address] [place], [place] [address]    Special Requests   Final    BOTTLES DRAWN AEROBIC AND ANAEROBIC Blood Culture adequate volume Performed at [facility], [address] [place], [place] [address]    Culture  Setup Time   Final    GRAM POSITIVE COCCI IN CHAINS IN BOTH AEROBIC AND ANAEROBIC BOTTLES Gram Stain Report Called to,Read Back By and Verified With: JASON ROBBINS 08/12/21 @ 0507 BY SB Performed at [facility], [address] [place], [place] [address]    Culture (A)  Final    ENTEROCOCCUS FAECALIS SUSCEPTIBILITIES PERFORMED ON PREVIOUS CULTURE WITHIN THE LAST 5 DAYS. Performed at [facility], [place] [address], [place], [place] [address]    Report Status 08/14/2021 FINAL  Final  Culture, blood (Routine X 2) w Reflex to ID Panel     Status: Abnormal   Collection Time: 08/11/21  5:47 PM   Specimen: BLOOD  Result Value Ref Range Status   Specimen Description   Final    BLOOD FOA Performed at [facility], [address],  [place], [place] [address]    Special Requests   Final    BOTTLES DRAWN AEROBIC AND ANAEROBIC Blood Culture adequate volume Performed at [facility], [address] [place], [place] [address]    Culture  Setup Time   Final    GRAM POSITIVE COCCI IN CHAINS IN BOTH AEROBIC AND ANAEROBIC BOTTLES Gram Stain Report Called to,Read Back By and Verified With: JASON ROBBINS 08/12/21 @ 0507 BY SB Performed at [facility], [place] [address], [place],  [address]    Culture ENTEROCOCCUS FAECALIS (A)  Final   Report Status 08/14/2021 FINAL  Final   Organism ID, Bacteria ENTEROCOCCUS FAECALIS  Final      Susceptibility   Enterococcus faecalis - MIC*    AMPICILLIN <=2 SENSITIVE Sensitive     VANCOMYCIN 2 SENSITIVE Sensitive     GENTAMICIN SYNERGY SENSITIVE Sensitive     * ENTEROCOCCUS FAECALIS  Blood Culture ID Panel  (Reflexed)     Status: Abnormal   Collection Time: 08/11/21  5:47 PM  Result Value Ref Range Status   Enterococcus faecalis DETECTED (A) NOT DETECTED Final    Comment: CRITICAL RESULT CALLED TO, READ BACK BY AND VERIFIED WITH: JASON ROBBINS 08/12/21 @ 0507 BY SB    Enterococcus Faecium NOT DETECTED NOT DETECTED Final   Listeria monocytogenes NOT DETECTED NOT DETECTED Final   Staphylococcus species NOT DETECTED NOT DETECTED Final   Staphylococcus aureus (BCID) NOT DETECTED NOT DETECTED Final   Staphylococcus epidermidis NOT DETECTED NOT DETECTED Final   Staphylococcus lugdunensis NOT DETECTED NOT DETECTED Final   Streptococcus species NOT DETECTED NOT DETECTED Final   Streptococcus agalactiae NOT DETECTED NOT DETECTED Final   Streptococcus pneumoniae NOT DETECTED NOT DETECTED Final   Streptococcus pyogenes NOT DETECTED NOT DETECTED Final   A.calcoaceticus-baumannii NOT DETECTED NOT DETECTED Final   Bacteroides fragilis NOT DETECTED NOT DETECTED Final   Enterobacterales NOT DETECTED NOT DETECTED Final   Enterobacter cloacae complex NOT DETECTED NOT DETECTED Final   Escherichia coli NOT DETECTED NOT DETECTED Final   Klebsiella aerogenes NOT DETECTED NOT DETECTED Final   Klebsiella oxytoca NOT DETECTED NOT DETECTED Final   Klebsiella pneumoniae NOT DETECTED NOT DETECTED Final   Proteus species NOT DETECTED NOT DETECTED Final   Salmonella species NOT DETECTED NOT DETECTED Final   Serratia marcescens NOT DETECTED NOT DETECTED Final   Haemophilus influenzae NOT DETECTED NOT DETECTED Final   Neisseria meningitidis NOT DETECTED NOT DETECTED Final   Pseudomonas aeruginosa NOT DETECTED NOT DETECTED Final   Stenotrophomonas maltophilia NOT DETECTED NOT DETECTED Final   Candida albicans NOT DETECTED NOT DETECTED Final   Candida auris NOT DETECTED NOT DETECTED Final   Candida glabrata NOT DETECTED NOT DETECTED Final   Candida krusei NOT DETECTED NOT DETECTED Final   Candida parapsilosis NOT  DETECTED NOT DETECTED Final   Candida tropicalis NOT DETECTED NOT DETECTED Final   Cryptococcus neoformans/gattii NOT DETECTED NOT DETECTED Final   Vancomycin resistance NOT DETECTED NOT DETECTED Final    Comment: Performed at [facility], [address] [place], [place] [address]  CULTURE, BLOOD (ROUTINE X 2) w Reflex to ID Panel     Status: None (Preliminary result)   Collection Time: 08/12/21 11:58 PM   Specimen: BLOOD  Result Value Ref Range Status   Specimen Description BLOOD BLOOD LEFT HAND  Final   Special Requests   Final    BOTTLES DRAWN AEROBIC AND ANAEROBIC Blood Culture adequate volume   Culture   Final  NO GROWTH 2 DAYS Performed at [facility], [address] [place], [place] [address]    Report Status PENDING  Incomplete  CULTURE, BLOOD (ROUTINE X 2) w Reflex to ID Panel     Status: None (Preliminary result)   Collection Time: 08/12/21 11:58 PM   Specimen: BLOOD  Result Value Ref Range Status   Specimen Description BLOOD LEFT RADIAL  Final   Special Requests   Final    BOTTLES DRAWN AEROBIC AND ANAEROBIC Blood Culture results may not be optimal due to an inadequate volume of blood received in culture bottles   Culture   Final    NO GROWTH 2 DAYS Performed at [facility], [address], [place], [place] [address]    Report Status PENDING  Incomplete    Coagulation Studies: No results for input(s): LABPROT, INR in the last 72 hours.  Urinalysis: No results for input(s): COLORURINE, LABSPEC, PHURINE, GLUCOSEU, HGBUR, BILIRUBINUR, KETONESUR, PROTEINUR, UROBILINOGEN, NITRITE, LEUKOCYTESUR in the last 72 hours.  Invalid input(s): APPERANCEUR    Imaging: MR CERVICAL SPINE W WO CONTRAST  Result Date: 08/13/2021 CLINICAL DATA:  Initial evaluation for acute severe lower back pain, sepsis, concern for infection. EXAM: MRI CERVICAL, THORACIC AND LUMBAR SPINE WITHOUT AND WITH CONTRAST TECHNIQUE: Multiplanar and multiecho pulse  sequences of the cervical spine, to include the craniocervical junction and cervicothoracic junction, and thoracic and lumbar spine, were obtained without and with intravenous contrast. CONTRAST:  28mL GADAVIST GADOBUTROL 1 MMOL/ML IV SOLN COMPARISON:  Comparison made with previous MRI from 08/12/2021 as well as prior CT from 08/08/2021. FINDINGS: MRI CERVICAL SPINE FINDINGS Alignment: Physiologic with preservation of the normal cervical lordosis. Vertebrae: Diffusely decreased T1 signal intensity seen throughout the visualized bone marrow, nonspecific, but most commonly related to anemia, smoking, or obesity. Fatty marrow conversion noted about the C5-6 interspace. No discrete or worrisome osseous lesions. There is increased STIR signal intensity with possible mild enhancement about the C5-6 interspace as compared to previous, suspicious for possible developing osteomyelitis no convincing evidence for acute discitis within the intervening C5-6 interspace at this time. No other abnormal marrow edema or enhancement to suggest acute infection elsewhere within the cervical spine. Cord: Normal signal and morphology. No epidural abscess or other collection. No abnormal enhancement. Posterior Fossa, vertebral arteries, paraspinal tissues: Prevertebral/retropharyngeal edema again seen extending from the skull base to the level of C5-6, slightly increased in prominence as compared to prior exam. No visible loculated soft tissue collection. Normal flow voids seen within the vertebral arteries bilaterally. Disc levels: No other new or progressive finding as compared to recent MRI. No high-grade spinal stenosis. MRI THORACIC SPINE FINDINGS Alignment: Physiologic with preservation of the normal thoracic kyphosis. No listhesis. Vertebrae: Diffusely decreased T1 signal intensity seen throughout the visualized bone marrow. No discrete or worrisome osseous lesions. No abnormal marrow edema or enhancement to suggest osteomyelitis  discitis or septic arthritis. Cord: Normal signal and morphology. No epidural abscess or other collection. No abnormal enhancement. Paraspinal and other soft tissues: Prominent paravertebral T2 hyperintense soft tissue again seen, likely reflecting prominent paravertebral fat. Extension into the subpleural spaces bilaterally. Superimposed irregular left pleural effusion which appears partially loculated. Multiple prominent venous collaterals noted throughout the visualized subcutaneous fat. Disc levels: No significant disc pathology.  No stenosis or neural impingement. MRI LUMBAR SPINE FINDINGS Segmentation: Standard. Lowest well-formed disc space labeled the L5-S1 level. Alignment: Trace anterolisthesis of L4 on L5, chronic and facet mediated. Alignment otherwise normal with preservation of the normal  lumbar lordosis. Vertebrae: Diffusely decreased T1 signal intensity seen throughout the visualized bone marrow. No discrete or worrisome osseous lesions. Endplate Schmorl's node deformities with associated fatty marrow conversion seen about the L2 vertebral body. There is fluid signal intensity within the L4-5 disc space without discernible enhancement, nonspecific, but could reflect early changes of acute discitis. No overt evidence for associated osteomyelitis within the adjacent vertebral bodies. Marrow edema and enhancement seen about the L4-5 facets bilaterally, favored to be degenerative in nature. No other abnormal marrow edema or enhancement. Conus medullaris and cauda equina: Conus extends to the L1-2 level. Conus and cauda equina appear normal. No epidural abscess or other collection. No abnormal enhancement. Paraspinal and other soft tissues: There is abnormal edema and enhancement involving the left psoas muscle, extending from the level of L3 inferiorly (series 41, image 35). Findings concerning for acute infection. Superimposed 1.3 cm area of T2 signal intensity suspicious for phlegmon/early abscess  formation (series 34, image 38). No other loculated soft tissue collections. Multiple prominent venous collaterals noted throughout the subcutaneous fat. 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 Disc levels: L1-2: Endplate Schmorl's node deformity without significant disc bulge. Mild facet hypertrophy. No stenosis. L2-3: Mild disc bulge with endplate Schmorl's node deformity. No spinal stenosis. Mild right L2 foraminal narrowing. L3-4: Mild disc bulge. Mild bilateral facet hypertrophy. No significant spinal stenosis. Mild right L3 foraminal narrowing. L4-5: Disc bulge with reactive endplate change. Nonspecific fluid signal intensity again noted within the L4-5 interspace. Moderate bilateral facet hypertrophy with associated reactive marrow edema and enhancement and small joint effusions. Resultant mild canal with mild to moderate left greater than right lateral recess stenosis. Mild right with moderate left L4 foraminal narrowing. L5-S1: Mild disc bulge with reactive endplate spurring. No significant spinal stenosis. Mild bilateral L5 foraminal narrowing. IMPRESSION: 1. Interval development of marrow edema about the C5-6 interspace since recent exam, concerning for possible developing osteomyelitis. Adjacent prevertebral/retropharyngeal edema has slightly increased from prior. 2. Abnormal inflammatory changes involving the left psoas muscle, concerning for acute infection with myositis. Superimposed 1.3 cm area of T2 signal intensity suspicious for phlegmon/early abscess formation. Fluid signal intensity within the L4-5 interspace is nonspecific, but could reflect changes of early discitis, and possibly be the source of the left psoas infection. 3. Moderate facet degeneration at L4-5 with associated reactive marrow edema and enhancement. Findings are favored to be degenerative in nature, although attention at follow-up is recommended. 4. Irregular and partially loculated left pleural  effusion. 5. Underlying diffusely abnormal marrow edema, stable from prior. Electronically Signed   By: Jeannine Boga M.D.   On: 08/13/2021 20:29   MR THORACIC SPINE W WO CONTRAST  Result Date: 08/13/2021 CLINICAL DATA:  Initial evaluation for acute severe lower back pain, sepsis, concern for infection. EXAM: MRI CERVICAL, THORACIC AND LUMBAR SPINE WITHOUT AND WITH CONTRAST TECHNIQUE: Multiplanar and multiecho pulse sequences of the cervical spine, to include the craniocervical junction and cervicothoracic junction, and thoracic and lumbar spine, were obtained without and with intravenous contrast. CONTRAST:  35mL GADAVIST GADOBUTROL 1 MMOL/ML IV SOLN COMPARISON:  Comparison made with previous MRI from 08/12/2021 as well as prior CT from 08/08/2021. FINDINGS: MRI CERVICAL SPINE FINDINGS Alignment: Physiologic with preservation of the normal cervical lordosis. Vertebrae: Diffusely decreased T1 signal intensity seen throughout the visualized bone marrow, nonspecific, but most commonly related to anemia, smoking, or obesity. Fatty marrow conversion noted about the C5-6 interspace. No discrete or worrisome osseous lesions. There is increased STIR signal intensity  with possible mild enhancement about the C5-6 interspace as compared to previous, suspicious for possible developing osteomyelitis no convincing evidence for acute discitis within the intervening C5-6 interspace at this time. No other abnormal marrow edema or enhancement to suggest acute infection elsewhere within the cervical spine. Cord: Normal signal and morphology. No epidural abscess or other collection. No abnormal enhancement. Posterior Fossa, vertebral arteries, paraspinal tissues: Prevertebral/retropharyngeal edema again seen extending from the skull base to the level of C5-6, slightly increased in prominence as compared to prior exam. No visible loculated soft tissue collection. Normal flow voids seen within the vertebral arteries  bilaterally. Disc levels: No other new or progressive finding as compared to recent MRI. No high-grade spinal stenosis. MRI THORACIC SPINE FINDINGS Alignment: Physiologic with preservation of the normal thoracic kyphosis. No listhesis. Vertebrae: Diffusely decreased T1 signal intensity seen throughout the visualized bone marrow. No discrete or worrisome osseous lesions. No abnormal marrow edema or enhancement to suggest osteomyelitis discitis or septic arthritis. Cord: Normal signal and morphology. No epidural abscess or other collection. No abnormal enhancement. Paraspinal and other soft tissues: Prominent paravertebral T2 hyperintense soft tissue again seen, likely reflecting prominent paravertebral fat. Extension into the subpleural spaces bilaterally. Superimposed irregular left pleural effusion which appears partially loculated. Multiple prominent venous collaterals noted throughout the visualized subcutaneous fat. Disc levels: No significant disc pathology.  No stenosis or neural impingement. MRI LUMBAR SPINE FINDINGS Segmentation: Standard. Lowest well-formed disc space labeled the L5-S1 level. Alignment: Trace anterolisthesis of L4 on L5, chronic and facet mediated. Alignment otherwise normal with preservation of the normal lumbar lordosis. Vertebrae: Diffusely decreased T1 signal intensity seen throughout the visualized bone marrow. No discrete or worrisome osseous lesions. Endplate Schmorl's node deformities with associated fatty marrow conversion seen about the L2 vertebral body. There is fluid signal intensity within the L4-5 disc space without discernible enhancement, nonspecific, but could reflect early changes of acute discitis. No overt evidence for associated osteomyelitis within the adjacent vertebral bodies. Marrow edema and enhancement seen about the L4-5 facets bilaterally, favored to be degenerative in nature. No other abnormal marrow edema or enhancement. Conus medullaris and cauda equina:  Conus extends to the L1-2 level. Conus and cauda equina appear normal. No epidural abscess or other collection. No abnormal enhancement. Paraspinal and other soft tissues: There is abnormal edema and enhancement involving the left psoas muscle, extending from the level of L3 inferiorly (series 41, image 35). Findings concerning for acute infection. Superimposed 1.3 cm area of T2 signal intensity suspicious for phlegmon/early abscess formation (series 34, image 38). No other loculated soft tissue collections. Multiple prominent venous collaterals noted throughout the subcutaneous fat. 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 Disc levels: L1-2: Endplate Schmorl's node deformity without significant disc bulge. Mild facet hypertrophy. No stenosis. L2-3: Mild disc bulge with endplate Schmorl's node deformity. No spinal stenosis. Mild right L2 foraminal narrowing. L3-4: Mild disc bulge. Mild bilateral facet hypertrophy. No significant spinal stenosis. Mild right L3 foraminal narrowing. L4-5: Disc bulge with reactive endplate change. Nonspecific fluid signal intensity again noted within the L4-5 interspace. Moderate bilateral facet hypertrophy with associated reactive marrow edema and enhancement and small joint effusions. Resultant mild canal with mild to moderate left greater than right lateral recess stenosis. Mild right with moderate left L4 foraminal narrowing. L5-S1: Mild disc bulge with reactive endplate spurring. No significant spinal stenosis. Mild bilateral L5 foraminal narrowing. IMPRESSION: 1. Interval development of marrow edema about the C5-6 interspace since recent exam, concerning for possible developing  osteomyelitis. Adjacent prevertebral/retropharyngeal edema has slightly increased from prior. 2. Abnormal inflammatory changes involving the left psoas muscle, concerning for acute infection with myositis. Superimposed 1.3 cm area of T2 signal intensity suspicious for  phlegmon/early abscess formation. Fluid signal intensity within the L4-5 interspace is nonspecific, but could reflect changes of early discitis, and possibly be the source of the left psoas infection. 3. Moderate facet degeneration at L4-5 with associated reactive marrow edema and enhancement. Findings are favored to be degenerative in nature, although attention at follow-up is recommended. 4. Irregular and partially loculated left pleural effusion. 5. Underlying diffusely abnormal marrow edema, stable from prior. Electronically Signed   By: Jeannine Boga M.D.   On: 08/13/2021 20:29   MR Lumbar Spine W Wo Contrast  Result Date: 08/13/2021 CLINICAL DATA:  Initial evaluation for acute severe lower back pain, sepsis, concern for infection. EXAM: MRI CERVICAL, THORACIC AND LUMBAR SPINE WITHOUT AND WITH CONTRAST TECHNIQUE: Multiplanar and multiecho pulse sequences of the cervical spine, to include the craniocervical junction and cervicothoracic junction, and thoracic and lumbar spine, were obtained without and with intravenous contrast. CONTRAST:  60mL GADAVIST GADOBUTROL 1 MMOL/ML IV SOLN COMPARISON:  Comparison made with previous MRI from 08/12/2021 as well as prior CT from 08/08/2021. FINDINGS: MRI CERVICAL SPINE FINDINGS Alignment: Physiologic with preservation of the normal cervical lordosis. Vertebrae: Diffusely decreased T1 signal intensity seen throughout the visualized bone marrow, nonspecific, but most commonly related to anemia, smoking, or obesity. Fatty marrow conversion noted about the C5-6 interspace. No discrete or worrisome osseous lesions. There is increased STIR signal intensity with possible mild enhancement about the C5-6 interspace as compared to previous, suspicious for possible developing osteomyelitis no convincing evidence for acute discitis within the intervening C5-6 interspace at this time. No other abnormal marrow edema or enhancement to suggest acute infection elsewhere within the  cervical spine. Cord: Normal signal and morphology. No epidural abscess or other collection. No abnormal enhancement. Posterior Fossa, vertebral arteries, paraspinal tissues: Prevertebral/retropharyngeal edema again seen extending from the skull base to the level of C5-6, slightly increased in prominence as compared to prior exam. No visible loculated soft tissue collection. Normal flow voids seen within the vertebral arteries bilaterally. Disc levels: No other new or progressive finding as compared to recent MRI. No high-grade spinal stenosis. MRI THORACIC SPINE FINDINGS Alignment: Physiologic with preservation of the normal thoracic kyphosis. No listhesis. Vertebrae: Diffusely decreased T1 signal intensity seen throughout the visualized bone marrow. No discrete or worrisome osseous lesions. No abnormal marrow edema or enhancement to suggest osteomyelitis discitis or septic arthritis. Cord: Normal signal and morphology. No epidural abscess or other collection. No abnormal enhancement. Paraspinal and other soft tissues: Prominent paravertebral T2 hyperintense soft tissue again seen, likely reflecting prominent paravertebral fat. Extension into the subpleural spaces bilaterally. Superimposed irregular left pleural effusion which appears partially loculated. Multiple prominent venous collaterals noted throughout the visualized subcutaneous fat. Disc levels: No significant disc pathology.  No stenosis or neural impingement. MRI LUMBAR SPINE FINDINGS Segmentation: Standard. Lowest well-formed disc space labeled the L5-S1 level. Alignment: Trace anterolisthesis of L4 on L5, chronic and facet mediated. Alignment otherwise normal with preservation of the normal lumbar lordosis. Vertebrae: Diffusely decreased T1 signal intensity seen throughout the visualized bone marrow. No discrete or worrisome osseous lesions. Endplate Schmorl's node deformities with associated fatty marrow conversion seen about the L2 vertebral body.  There is fluid signal intensity within the L4-5 disc space without discernible enhancement, nonspecific, but could reflect early changes of acute discitis.  No overt evidence for associated osteomyelitis within the adjacent vertebral bodies. Marrow edema and enhancement seen about the L4-5 facets bilaterally, favored to be degenerative in nature. No other abnormal marrow edema or enhancement. Conus medullaris and cauda equina: Conus extends to the L1-2 level. Conus and cauda equina appear normal. No epidural abscess or other collection. No abnormal enhancement. Paraspinal and other soft tissues: There is abnormal edema and enhancement involving the left psoas muscle, extending from the level of L3 inferiorly (series 41, image 35). Findings concerning for acute infection. Superimposed 1.3 cm area of T2 signal intensity suspicious for phlegmon/early abscess formation (series 34, image 38). No other loculated soft tissue collections. Multiple prominent venous collaterals noted throughout the subcutaneous fat. 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 Disc levels: L1-2: Endplate Schmorl's node deformity without significant disc bulge. Mild facet hypertrophy. No stenosis. L2-3: Mild disc bulge with endplate Schmorl's node deformity. No spinal stenosis. Mild right L2 foraminal narrowing. L3-4: Mild disc bulge. Mild bilateral facet hypertrophy. No significant spinal stenosis. Mild right L3 foraminal narrowing. L4-5: Disc bulge with reactive endplate change. Nonspecific fluid signal intensity again noted within the L4-5 interspace. Moderate bilateral facet hypertrophy with associated reactive marrow edema and enhancement and small joint effusions. Resultant mild canal with mild to moderate left greater than right lateral recess stenosis. Mild right with moderate left L4 foraminal narrowing. L5-S1: Mild disc bulge with reactive endplate spurring. No significant spinal stenosis. Mild bilateral L5  foraminal narrowing. IMPRESSION: 1. Interval development of marrow edema about the C5-6 interspace since recent exam, concerning for possible developing osteomyelitis. Adjacent prevertebral/retropharyngeal edema has slightly increased from prior. 2. Abnormal inflammatory changes involving the left psoas muscle, concerning for acute infection with myositis. Superimposed 1.3 cm area of T2 signal intensity suspicious for phlegmon/early abscess formation. Fluid signal intensity within the L4-5 interspace is nonspecific, but could reflect changes of early discitis, and possibly be the source of the left psoas infection. 3. Moderate facet degeneration at L4-5 with associated reactive marrow edema and enhancement. Findings are favored to be degenerative in nature, although attention at follow-up is recommended. 4. Irregular and partially loculated left pleural effusion. 5. Underlying diffusely abnormal marrow edema, stable from prior. Electronically Signed   By: Jeannine Boga M.D.   On: 08/13/2021 20:29   ECHOCARDIOGRAM COMPLETE  Result Date: 08/13/2021    ECHOCARDIOGRAM REPORT   Patient Name:   TKAI SERFASS Date of Exam: 08/13/2021 Medical Rec #:  [id number]     Height:       73.0 in Accession #:    [id number]    Weight:       200.6 lb Date of Birth:  Nov 13, 1961     BSA:          2.154 m Patient Age:    61 years      BP:           109/48 mmHg Patient Gender: M             HR:           95 bpm. Exam Location:  [facility] Procedure: 2D Echo, 3D Echo and Strain Analysis Indications:     Bacteremia R78.81  History:         Patient has no prior history of Echocardiogram examinations.  Sonographer:     Kathlen Brunswick RDCS Referring Phys:  [id number] Otila Kluver LAI Diagnosing Phys: Kate Sable MD IMPRESSIONS  1. Left ventricular ejection fraction, by estimation, is 30 to 35%. The  left ventricle has moderate to severely decreased function. The left ventricle demonstrates global hypokinesis. There is mild left ventricular  hypertrophy. Left ventricular diastolic parameters are consistent with Grade I diastolic dysfunction (impaired relaxation). The average left ventricular global longitudinal strain is -6.4 %. The global longitudinal strain is abnormal.  2. Right ventricular systolic function is moderately reduced. The right ventricular size is moderately enlarged.  3. Right atrial size was mild to moderately dilated.  4. The mitral valve is degenerative. Mild mitral valve regurgitation.  5. Tricuspid valve regurgitation is mild to moderate.  6. The aortic valve is calcified. Aortic valve regurgitation is moderate. Mild to moderate aortic valve stenosis. Aortic valve area, by VTI measures 1.52 cm. Aortic valve mean gradient measures 20.2 mmHg.  7. The inferior vena cava is normal in size with <50% respiratory variability, suggesting right atrial pressure of 8 mmHg. FINDINGS  Left Ventricle: Left ventricular ejection fraction, by estimation, is 30 to 35%. The left ventricle has moderate to severely decreased function. The left ventricle demonstrates global hypokinesis. The average left ventricular global longitudinal strain is -6.4 %. The global longitudinal strain is abnormal. 3D left ventricular ejection fraction analysis performed but not reported based on interpreter judgement due to suboptimal quality. The left ventricular internal cavity size was normal in size. There  is mild left ventricular hypertrophy. Left ventricular diastolic parameters are consistent with Grade I diastolic dysfunction (impaired relaxation). Right Ventricle: The right ventricular size is moderately enlarged. No increase in right ventricular wall thickness. Right ventricular systolic function is moderately reduced. Left Atrium: Left atrial size was normal in size. Right Atrium: Right atrial size was mild to moderately dilated. Pericardium: There is no evidence of pericardial effusion. Mitral Valve: The mitral valve is degenerative in appearance. Mild  mitral valve regurgitation. Tricuspid Valve: The tricuspid valve is not well visualized. Tricuspid valve regurgitation is mild to moderate. Aortic Valve: The aortic valve is calcified. Aortic valve regurgitation is moderate. Aortic regurgitation PHT measures 227 msec. Mild to moderate aortic stenosis is present. Aortic valve mean gradient measures 20.2 mmHg. Aortic valve peak gradient measures 33.8 mmHg. Aortic valve area, by VTI measures 1.52 cm. Pulmonic Valve: The pulmonic valve was not well visualized. Pulmonic valve regurgitation is not visualized. Aorta: The aortic root is normal in size and structure. Venous: The inferior vena cava is normal in size with less than 50% respiratory variability, suggesting right atrial pressure of 8 mmHg. IAS/Shunts: No atrial level shunt detected by color flow Doppler.  LEFT VENTRICLE PLAX 2D LVIDd:         6.00 cm      Diastology LVIDs:         4.80 cm      LV e' medial:    5.77 cm/s LV PW:         1.30 cm      LV E/e' medial:  13.7 LV IVS:        1.20 cm      LV e' lateral:   7.18 cm/s LVOT diam:     2.20 cm      LV E/e' lateral: 11.0 LV SV:         82 LV SV Index:   38           2D Longitudinal Strain LVOT Area:     3.80 cm     2D Strain GLS Avg:     -6.4 %  LV Volumes (MOD) LV vol d, MOD A2C: 214.0 ml 3D Volume  EF: LV vol d, MOD A4C: 242.0 ml 3D EF:        36 % LV vol s, MOD A2C: 140.5 ml LV EDV:       327 ml LV vol s, MOD A4C: 156.0 ml LV ESV:       209 ml LV SV MOD A2C:     73.5 ml  LV SV:        119 ml LV SV MOD A4C:     242.0 ml LV SV MOD BP:      83.8 ml RIGHT VENTRICLE RV Basal diam:  4.60 cm TAPSE (M-mode): 1.8 cm LEFT ATRIUM           Index       RIGHT ATRIUM           Index LA diam:      5.10 cm 2.37 cm/m  RA Area:     24.50 cm LA Vol (A2C): 67.2 ml 31.19 ml/m RA Volume:   89.40 ml  41.50 ml/m LA Vol (A4C): 40.2 ml 18.66 ml/m  AORTIC VALVE                    PULMONIC VALVE AV Area (Vmax):    1.64 cm     PV Vmax:       1.03 m/s AV Area (Vmean):   1.37 cm      PV Peak grad:  4.2 mmHg AV Area (VTI):     1.52 cm AV Vmax:           290.50 cm/s AV Vmean:          211.750 cm/s AV VTI:            0.539 m AV Peak Grad:      33.8 mmHg AV Mean Grad:      20.2 mmHg LVOT Vmax:         125.00 cm/s LVOT Vmean:        76.500 cm/s LVOT VTI:          0.216 m LVOT/AV VTI ratio: 0.40 AI PHT:            227 msec  AORTA Ao Root diam: 3.70 cm Ao Asc diam:  3.80 cm MITRAL VALVE                TRICUSPID VALVE MV Area (PHT): 5.02 cm     TV Peak grad:   32.1 mmHg MV Decel Time: 151 msec     TV Vmax:        2.84 m/s MV E velocity: 78.80 cm/s MV A velocity: 113.00 cm/s  SHUNTS MV E/A ratio:  0.70         Systemic VTI:  0.22 m                             Systemic Diam: 2.20 cm Kate Sable MD Electronically signed by Kate Sable MD Signature Date/Time: 08/13/2021/3:47:36 PM    Final      Medications:    ampicillin (OMNIPEN) IV 2 g (08/15/21 0526)   cefTRIAXone (ROCEPHIN)  IV 2 g (08/15/21 1031)   sodium chloride     sodium chloride       apixaban  2.5 mg Oral BID   calcium acetate  2,668 mg Oral TID WC   Chlorhexidine Gluconate Cloth  6 each Topical Q0600   docusate sodium  100 mg Oral BID   epoetin (  EPOGEN/PROCRIT) injection  4,000 Units Intravenous Q M,W,F-HD   feeding supplement (NEPRO CARB STEADY)  237 mL Oral TID BM   lidocaine  2 patch Transdermal Q24H   nicotine  21 mg Transdermal Daily   senna-docusate  1 tablet Oral BID   acetaminophen, hydrALAZINE, LORazepam, Muscle Rub, ondansetron (ZOFRAN) IV, oxyCODONE-acetaminophen  Assessment/ Plan:  [name] is a 60 y.o.  male with medical conditions including anemia, CHF, hypertension, and ESRD on dialysis. Patient has presented to ED with progressive back pain.  [place]/MWF/Rt AVG  1. End stage renal disease on dialysis Received dialysis yesterday, UF 1.5L removed Tolerated well  Next treatment scheduled for Friday  2. Anemia of chronic kidney disease Lab Results  Component  Value Date   HGB 8.7 (L) 08/15/2021  Low dose Epogen 4070 with dialysis treatments.  3. Secondary Hyperparathyroidism:  Lab Results  Component Value Date   PTH 583.2 (H) 12/11/2012   CALCIUM 10.1 08/15/2021   CAION 1.27 (H) 12/26/2012   PHOS 7.6 (H) 08/11/2021  Will check phosphorus with am labs Continue calcium acetate with meals  4. 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  Multiple abnormalities noted. ID consult and recs appreciated.  Also abnormality noted within the left psoas muscle.     LOS: 6   9/6/202210:55 AM

## 2021-08-15 NOTE — Progress Notes (Signed)
Physical Therapy Treatment Patient Details Name: [name] [name] MRN: [id number] DOB: 1961/02/06 Today's Date: 08/15/2021    History of Present Illness Pt is a 60 y.o. male with medical history significant of ESRD-HD (MWF), hypertension, tobacco abuse, polysubstance abuse, diverticulitis, anemia, jugular vein thrombosis, GI bleeding, right pneumothorax, who presents with lower back pain. 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    PT Comments    Pt willing to participate with PT services this session despite anxiety regarding back pain with mobility.  Pt required physical assistance with all functional tasks and was only able to take two very small steps at the EOB before needing to return to sitting secondary to leg weakness and back pain. Pt will benefit from PT services in a SNF setting upon discharge to safely address deficits listed in patient problem list for decreased caregiver assistance and eventual return to PLOF.    Follow Up Recommendations  SNF     Equipment Recommendations  Rolling walker with 5" wheels;3in1 (PT)    Recommendations for Other Services       Precautions / Restrictions Precautions Precautions: Fall Restrictions Weight Bearing Restrictions: No    Mobility  Bed Mobility Overal bed mobility: Needs Assistance Bed Mobility: Rolling;Supine to Sit;Sit to Supine Rolling: +2 for physical assistance;Mod assist Sidelying to sit: Mod assist     Sit to sidelying: Mod assist General bed mobility comments: Mod A with rolling and during sidelying to/from sit for BLE and trunk control    Transfers Overall transfer level: Needs assistance Equipment used: Rolling walker (2 wheeled) Transfers: Sit to/from Stand Sit to Stand: Mod assist;+2 safety/equipment         General transfer comment: Mod A to come to  full upright standing position  Ambulation/Gait Ambulation/Gait assistance: Min assist Gait Distance (Feet): 1 Feet Assistive device: Rolling walker (2 wheeled) Gait Pattern/deviations: Step-through pattern;Decreased step length - right;Decreased step length - left;Trunk flexed Gait velocity: decreased   General Gait Details: Pt able to take two very small shuffling steps at the EOB before returning to sitting, declined to attempt additional ambulation due to weakness/back pain   Stairs             Wheelchair Mobility    Modified Rankin (Stroke Patients Only)       Balance Overall balance assessment: Needs assistance   Sitting balance-Leahy Scale: Fair     Standing balance support: Bilateral upper extremity supported;During functional activity Standing balance-Leahy Scale: Poor Standing balance comment: Min A for stability                            Cognition Arousal/Alertness: Awake/alert Behavior During Therapy: WFL for tasks assessed/performed Overall Cognitive Status: No family/caregiver present to determine baseline cognitive functioning                                        Exercises Total Joint Exercises Ankle Circles/Pumps: AROM;Strengthening;Both;10 reps Hip ABduction/ADduction: AAROM;Strengthening;Both;10 reps Long Arc Quad: AROM;Strengthening;Both;10 reps Knee Flexion: AROM;Strengthening;Both;10 reps Other Exercises Other Exercises: Gentle low amplitude rolling left/right with cues for sequecing Other Exercises: Static unsupported sitting at EOB for improved activity tolerance and core therex    General Comments        Pertinent  Vitals/Pain Pain Assessment: 0-10 Pain Score: 3  Pain Location: Low back pain at rest, 9-10/10 with movement Pain Descriptors / Indicators: Aching;Sore;Moaning;Grimacing Pain Intervention(s): Premedicated before session;Repositioned;Monitored during session    Home Living                       Prior Function            PT Goals (current goals can now be found in the care plan section) Progress towards PT goals: Not progressing toward goals - comment (limited by weakness and back pain)    Frequency    Min 2X/week      PT Plan Current plan remains appropriate    Co-evaluation              AM-PAC PT "6 Clicks" Mobility   Outcome Measure  Help needed turning from your back to your side while in a flat bed without using bedrails?: A Lot Help needed moving from lying on your back to sitting on the side of a flat bed without using bedrails?: A Lot Help needed moving to and from a bed to a chair (including a wheelchair)?: A Lot Help needed standing up from a chair using your arms (e.g., wheelchair or bedside chair)?: A Lot Help needed to walk in hospital room?: Total Help needed climbing 3-5 steps with a railing? : Total 6 Click Score: 10    End of Session Equipment Utilized During Treatment: Gait belt Activity Tolerance: Patient limited by pain Patient left: in bed;with call bell/phone within reach;with bed alarm set Nurse Communication: Mobility status PT Visit Diagnosis: Unsteadiness on feet (R26.81);Difficulty in walking, not elsewhere classified (R26.2);Muscle weakness (generalized) (M62.81);Pain     Time: 1047-1110 PT Time Calculation (min) (ACUTE ONLY): 23 min  Charges:  $Therapeutic Exercise: 8-22 mins $Therapeutic Activity: 8-22 mins                     D. Scott Kayvion Arneson PT, DPT 08/15/21, 1:43 PM

## 2021-08-15 NOTE — Progress Notes (Signed)
   08/13/21 2331  Assess: MEWS Score  Temp (!) 102.6 F (39.2 C)  BP (!) 121/54  Pulse Rate (!) 112  Resp 16  SpO2 100 %  O2 Device Nasal Cannula  O2 Flow Rate (L/min) 2 L/min  Assess: MEWS Score  MEWS Temp 2  MEWS Systolic 0  MEWS Pulse 2  MEWS RR 0  MEWS LOC 0  MEWS Score 4  MEWS Score Color Red  Assess: if the MEWS score is Yellow or Red  Were vital signs taken at a resting state? Yes  Focused Assessment No change from prior assessment  Does the patient meet 2 or more of the SIRS criteria? Yes  Does the patient have a confirmed or suspected source of infection? No  MEWS guidelines implemented *See Row Information* Yes  Treat  MEWS Interventions Administered prn meds/treatments  Pain Scale 0-10  Pain Score Asleep  Take Vital Signs  Increase Vital Sign Frequency  Red: Q 1hr X 4 then Q 4hr X 4, if remains red, continue Q 4hrs  Escalate  MEWS: Escalate Red: discuss with charge nurse/RN and provider, consider discussing with RRT  Notify: Charge Nurse/RN  Name of Charge Nurse/RN Notified Linisse, RN  Date Charge Nurse/RN Notified 08/13/21  Time Charge Nurse/RN Notified 2347  Notify: Provider  Provider Name/Title T. Opyd  Date Provider Notified 08/13/21  Time Provider Notified 2345  Notification Type Page  Notification Reason Other (Comment);Change in status  Notify: Rapid Response  Name of Rapid Response RN Notified Morey Hummingbird, RN  Date Rapid Response Notified 08/14/21  Time Rapid Response Notified 2355  Assess: SIRS CRITERIA  SIRS Temperature  1  SIRS Pulse 1  SIRS Respirations  0  SIRS WBC 0  SIRS Score Sum  2  Inserted for [facility] RN

## 2021-08-16 ENCOUNTER — Encounter: Payer: Self-pay | Admitting: Hospitalist

## 2021-08-16 ENCOUNTER — Inpatient Hospital Stay
Admit: 2021-08-16 | Discharge: 2021-08-16 | Disposition: A | Discharge status: Home / Self Care | Payer: Medicare Other | Attending: Cardiovascular Disease | Admitting: Cardiovascular Disease

## 2021-08-16 ENCOUNTER — Encounter
Admission: EM | Disposition: A | Discharge status: Other Acute Inpatient Hospital | Payer: Self-pay | Source: Home / Self Care | Attending: Internal Medicine

## 2021-08-16 DIAGNOSIS — M5440 Lumbago with sciatica, unspecified side: Secondary | ICD-10-CM | POA: Diagnosis not present

## 2021-08-16 DIAGNOSIS — F1910 Other psychoactive substance abuse, uncomplicated: Secondary | ICD-10-CM | POA: Diagnosis not present

## 2021-08-16 DIAGNOSIS — B952 Enterococcus as the cause of diseases classified elsewhere: Secondary | ICD-10-CM | POA: Diagnosis not present

## 2021-08-16 DIAGNOSIS — I82C19 Acute embolism and thrombosis of unspecified internal jugular vein: Secondary | ICD-10-CM

## 2021-08-16 DIAGNOSIS — I330 Acute and subacute infective endocarditis: Secondary | ICD-10-CM | POA: Diagnosis not present

## 2021-08-16 DIAGNOSIS — N186 End stage renal disease: Secondary | ICD-10-CM | POA: Diagnosis not present

## 2021-08-16 DIAGNOSIS — R7881 Bacteremia: Secondary | ICD-10-CM | POA: Diagnosis not present

## 2021-08-16 DIAGNOSIS — M4646 Discitis, unspecified, lumbar region: Secondary | ICD-10-CM | POA: Diagnosis not present

## 2021-08-16 HISTORY — PX: TEE WITHOUT CARDIOVERSION: SHX5443

## 2021-08-16 LAB — MAGNESIUM: Magnesium: 1.9 mg/dL (ref 1.7–2.4)

## 2021-08-16 LAB — CBC
HCT: 28.1 % — ABNORMAL LOW (ref 39.0–52.0)
Hemoglobin: 8.8 g/dL — ABNORMAL LOW (ref 13.0–17.0)
MCH: 29.9 pg (ref 26.0–34.0)
MCHC: 31.3 g/dL (ref 30.0–36.0)
MCV: 95.6 fL (ref 80.0–100.0)
Platelets: 211 10*3/uL (ref 150–400)
RBC: 2.94 MIL/uL — ABNORMAL LOW (ref 4.22–5.81)
RDW: 16.8 % — ABNORMAL HIGH (ref 11.5–15.5)
WBC: 8.6 10*3/uL (ref 4.0–10.5)
nRBC: 0 % (ref 0.0–0.2)

## 2021-08-16 LAB — BASIC METABOLIC PANEL
Anion gap: 12 (ref 5–15)
BUN: 58 mg/dL — ABNORMAL HIGH (ref 6–20)
CO2: 28 mmol/L (ref 22–32)
Calcium: 10.9 mg/dL — ABNORMAL HIGH (ref 8.9–10.3)
Chloride: 96 mmol/L — ABNORMAL LOW (ref 98–111)
Creatinine, Ser: 8.68 mg/dL — ABNORMAL HIGH (ref 0.61–1.24)
GFR, Estimated: 7 mL/min — ABNORMAL LOW (ref >60)
Glucose, Bld: 96 mg/dL (ref 70–99)
Potassium: 4.2 mmol/L (ref 3.5–5.1)
Sodium: 136 mmol/L (ref 135–145)

## 2021-08-16 LAB — PHOSPHORUS: Phosphorus: 6.2 mg/dL — ABNORMAL HIGH (ref 2.5–4.6)

## 2021-08-16 SURGERY — ECHOCARDIOGRAM, TRANSESOPHAGEAL
Anesthesia: Moderate Sedation | Laterality: Right

## 2021-08-16 MED ORDER — POLYETHYLENE GLYCOL 3350 17 G PO PACK
17.0000 g | PACK | Freq: Every day | ORAL | Status: DC | PRN
  Administered 2021-08-17: 17 g via ORAL
  Filled 2021-08-16: qty 1

## 2021-08-16 MED ORDER — MIDAZOLAM HCL 2 MG/2ML IJ SOLN
INTRAMUSCULAR | Status: AC | PRN
  Administered 2021-08-16 (×2): 1 mg via INTRAVENOUS

## 2021-08-16 MED ORDER — SODIUM CHLORIDE FLUSH 0.9 % IV SOLN
INTRAVENOUS | Status: AC
  Filled 2021-08-16: qty 10

## 2021-08-16 MED ORDER — BUTAMBEN-TETRACAINE-BENZOCAINE 2-2-14 % EX AERO
INHALATION_SPRAY | CUTANEOUS | Status: AC
  Filled 2021-08-16: qty 5

## 2021-08-16 MED ORDER — FENTANYL CITRATE (PF) 100 MCG/2ML IJ SOLN
INTRAMUSCULAR | Status: AC | PRN
  Administered 2021-08-16: 50 ug via INTRAVENOUS

## 2021-08-16 MED ORDER — FENTANYL CITRATE (PF) 100 MCG/2ML IJ SOLN
INTRAMUSCULAR | Status: AC
  Filled 2021-08-16: qty 2

## 2021-08-16 MED ORDER — SODIUM CHLORIDE 0.9 % IV SOLN: INTRAVENOUS | Status: DC

## 2021-08-16 MED ORDER — LIDOCAINE VISCOUS HCL 2 % MT SOLN
OROMUCOSAL | Status: AC
  Filled 2021-08-16: qty 15

## 2021-08-16 MED ORDER — MIDAZOLAM HCL 2 MG/2ML IJ SOLN
INTRAMUSCULAR | Status: AC
  Filled 2021-08-16: qty 4

## 2021-08-16 MED ORDER — BISACODYL 10 MG RE SUPP: 10.0000 mg | Freq: Every day | RECTAL | Status: DC | PRN

## 2021-08-16 NOTE — Progress Notes (Signed)
OT Cancellation Note  Patient Details Name: [name] [name] MRN: [id number] DOB: [birth year]   Cancelled Treatment:    Reason Eval/Treat Not Completed: Patient at procedure or test/ unavailable. OT continues to follow pt for therapy services. Pt currently OTF for TEE. Will hold at this time and re-attempt at a later date/time as available and pt medically appropriate for therapy services.  Shara Blazing, M.S., OTR/L Ascom: [phone] 08/16/21, 8:25 AM

## 2021-08-16 NOTE — Progress Notes (Signed)
PT Cancellation Note  Patient Details Name: KELLER BOUNDS MRN: [id number] DOB: 1961-08-11   Cancelled Treatment:    Reason Eval/Treat Not Completed: Patient at procedure or test/unavailable, will attempt to see pt at a future date/time as medically appropriate.     Linus Salmons PT, DPT 08/16/21, 5:07 PM

## 2021-08-16 NOTE — Progress Notes (Signed)
[facility]  ROUNDING NOTE   Subjective:   [name] [name] is a 60 y.o. male with medical conditions including anemia, CHF, hypertension, and ESRD on dialysis. Patient has presented to ED with progressive back pain.   Patient states he has always had back pain but it has increased in the past few days. Denies known precipitating factors. Chest X-ray of lumbar spine shows multilevel degenerative changes and stenosis, greatest at L4-L5.   Patient is known to our practice and receives dialysis at [facility] supervised by Dr Juleen China. We have been consulted to manage dialysis treatments.   Patient seen resting comfortable after procedure Drowsy  TEE completed this am   Objective:  Vital signs in last 24 hours:  Temp:  [97.9 F (36.6 C)-98.7 F (37.1 C)] 98.3 F (36.8 C) (09/07 1302) Pulse Rate:  [94-104] 98 (09/07 1302) Resp:  [15-20] 16 (09/07 1302) BP: (112-134)/(52-74) 131/63 (09/07 1302) SpO2:  [90 %-100 %] 100 % (09/07 1302) Weight:  [91 kg] 91 kg (09/07 0754)  Weight change:  Filed Weights   08/08/21 0625 08/16/21 0754  Weight: 91 kg 91 kg    Intake/Output: No intake/output data recorded.   Intake/Output this shift:  Total I/O In: 360 [P.O.:360] Out: -   Physical Exam: General: NAD, resting in bed  Head: Normocephalic, atraumatic. Moist oral mucosal membranes  Eyes: Anicteric  Lungs:  Clear to auscultation, normal effort  Heart: Regular rate and rhythm  Abdomen:  Soft, nontender, distended  Extremities: Mild right upper extremity swelling, no lower extremity edema  Neurologic: Alert, moving all four extremities  Skin: No lesions  Access: Rt AVG, swelling noted in right upper extremity    Basic Metabolic Panel: Recent Labs  Lab 08/11/21 0226 08/12/21 0613 08/13/21 0827 08/14/21 0614 08/15/21 0446 08/16/21 0429  NA 136 138 136 135 136 136  K 5.0 4.9 4.6 4.8 4.0 4.2  CL 99 98 97* 96* 96* 96*  CO2 28 28 29 28 29 28   GLUCOSE 89 94 90  111* 102* 96  BUN 65* 56* 55* 65* 46* 58*  CREATININE 9.61* 8.52* 7.92* 9.28* 7.01* 8.68*  CALCIUM 9.9 9.6 10.0 10.0 10.1 10.9*  MG 2.2 2.0 1.9 2.0 1.9 1.9  PHOS 7.6*  --   --   --   --  6.2*    Liver Function Tests: No results for input(s): AST, ALT, ALKPHOS, BILITOT, PROT, ALBUMIN in the last 168 hours.  No results for input(s): LIPASE, AMYLASE in the last 168 hours. No results for input(s): AMMONIA in the last 168 hours.  CBC: Recent Labs  Lab 08/12/21 0613 08/13/21 0827 08/14/21 0614 08/15/21 0446 08/16/21 0429  WBC 9.6 8.8 8.2 8.5 8.6  HGB 9.2* 8.4* 8.6* 8.7* 8.8*  HCT 30.2* 26.9* 28.7* 27.3* 28.1*  MCV 96.2 97.8 96.0 97.2 95.6  PLT 143* 138* 180 179 211    Cardiac Enzymes: No results for input(s): CKTOTAL, CKMB, CKMBINDEX, TROPONINI in the last 168 hours.  BNP: Invalid input(s): POCBNP  CBG: Recent Labs  Lab 08/14/21 0742 08/14/21 1639  GLUCAP 93 346*    Microbiology: Results for orders placed or performed during the hospital encounter of 08/08/21  Resp Panel by RT-PCR (Flu A&B, Covid) Nasopharyngeal Swab     Status: None   Collection Time: 08/08/21  1:22 PM   Specimen: Nasopharyngeal Swab; Nasopharyngeal(NP) swabs in vial transport medium  Result Value Ref Range Status   SARS Coronavirus 2 by RT PCR NEGATIVE NEGATIVE Final  Comment: (NOTE) SARS-CoV-2 target nucleic acids are NOT DETECTED.  The SARS-CoV-2 RNA is generally detectable in upper respiratory specimens during the acute phase of infection. The lowest concentration of SARS-CoV-2 viral copies this assay can detect is 138 copies/mL. A negative result does not preclude SARS-Cov-2 infection and should not be used as the sole basis for treatment or other patient management decisions. A negative result may occur with  improper specimen collection/handling, submission of specimen other than nasopharyngeal swab, presence of viral mutation(s) within the areas targeted by this assay, and inadequate  number of viral copies(<138 copies/mL). A negative result must be combined with clinical observations, patient history, and epidemiological information. The expected result is Negative.  Fact Sheet for Patients:  EntrepreneurPulse.com.au  Fact Sheet for Healthcare Providers:  IncredibleEmployment.be  This test is no t yet approved or cleared by the [place] FDA and  has been authorized for detection and/or diagnosis of SARS-CoV-2 by FDA under an Emergency Use Authorization (EUA). This EUA will remain  in effect (meaning this test can be used) for the duration of the COVID-19 declaration under Section 564(b)(1) of the Act, 21 U.S.C.section 360bbb-3(b)(1), unless the authorization is terminated  or revoked sooner.       Influenza A by PCR NEGATIVE NEGATIVE Final   Influenza B by PCR NEGATIVE NEGATIVE Final    Comment: (NOTE) The Xpert Xpress SARS-CoV-2/FLU/RSV plus assay is intended as an aid in the diagnosis of influenza from Nasopharyngeal swab specimens and should not be used as a sole basis for treatment. Nasal washings and aspirates are unacceptable for Xpert Xpress SARS-CoV-2/FLU/RSV testing.  Fact Sheet for Patients: EntrepreneurPulse.com.au  Fact Sheet for Healthcare Providers: IncredibleEmployment.be  This test is not yet approved or cleared by the [place] FDA and has been authorized for detection and/or diagnosis of SARS-CoV-2 by FDA under an Emergency Use Authorization (EUA). This EUA will remain in effect (meaning this test can be used) for the duration of the COVID-19 declaration under Section 564(b)(1) of the Act, 21 U.S.C. section 360bbb-3(b)(1), unless the authorization is terminated or revoked.  Performed at [facility], [address] [place], [place] [address]   Culture, blood (Routine X 2) w Reflex to ID Panel     Status: Abnormal   Collection Time: 08/11/21   5:47 PM   Specimen: BLOOD  Result Value Ref Range Status   Specimen Description   Final    BLOOD LEFT HAND Performed at [facility], [address] [place], [place] [address]    Special Requests   Final    BOTTLES DRAWN AEROBIC AND ANAEROBIC Blood Culture adequate volume Performed at [facility], [address] [place], [place] [address]    Culture  Setup Time   Final    GRAM POSITIVE COCCI IN CHAINS IN BOTH AEROBIC AND ANAEROBIC BOTTLES Gram Stain Report Called to,Read Back By and Verified With: JASON ROBBINS 08/12/21 @ 0507 BY SB Performed at [facility], [address] [place], [place] [address]    Culture (A)  Final    ENTEROCOCCUS FAECALIS SUSCEPTIBILITIES PERFORMED ON PREVIOUS CULTURE WITHIN THE LAST 5 DAYS. Performed at [facility], [address], [place], [place] [address]    Report Status 08/14/2021 FINAL  Final  Culture, blood (Routine X 2) w Reflex to ID Panel     Status: Abnormal   Collection Time: 08/11/21  5:47 PM   Specimen: BLOOD  Result Value Ref Range Status   Specimen Description  Final    BLOOD FOA Performed at [facility], [address] [place], [place] [address]    Special Requests   Final    BOTTLES DRAWN AEROBIC AND ANAEROBIC Blood Culture adequate volume Performed at [facility], [address] [place], [place] [address]    Culture  Setup Time   Final    GRAM POSITIVE COCCI IN CHAINS IN BOTH AEROBIC AND ANAEROBIC BOTTLES Gram Stain Report Called to,Read Back By and Verified With: JASON ROBBINS 08/12/21 @ 0507 BY SB Performed at [facility], [place] [address], [place], [place] [address]    Culture ENTEROCOCCUS FAECALIS (A)  Final   Report Status 08/14/2021 FINAL  Final   Organism ID, Bacteria ENTEROCOCCUS FAECALIS  Final      Susceptibility   Enterococcus faecalis - MIC*    AMPICILLIN <=2 SENSITIVE Sensitive     VANCOMYCIN 2 SENSITIVE Sensitive     GENTAMICIN SYNERGY SENSITIVE  Sensitive     * ENTEROCOCCUS FAECALIS  Blood Culture ID Panel (Reflexed)     Status: Abnormal   Collection Time: 08/11/21  5:47 PM  Result Value Ref Range Status   Enterococcus faecalis DETECTED (A) NOT DETECTED Final    Comment: CRITICAL RESULT CALLED TO, READ BACK BY AND VERIFIED WITH: JASON ROBBINS 08/12/21 @ 0507 BY SB    Enterococcus Faecium NOT DETECTED NOT DETECTED Final   Listeria monocytogenes NOT DETECTED NOT DETECTED Final   Staphylococcus species NOT DETECTED NOT DETECTED Final   Staphylococcus aureus (BCID) NOT DETECTED NOT DETECTED Final   Staphylococcus epidermidis NOT DETECTED NOT DETECTED Final   Staphylococcus lugdunensis NOT DETECTED NOT DETECTED Final   Streptococcus species NOT DETECTED NOT DETECTED Final   Streptococcus agalactiae NOT DETECTED NOT DETECTED Final   Streptococcus pneumoniae NOT DETECTED NOT DETECTED Final   Streptococcus pyogenes NOT DETECTED NOT DETECTED Final   A.calcoaceticus-baumannii NOT DETECTED NOT DETECTED Final   Bacteroides fragilis NOT DETECTED NOT DETECTED Final   Enterobacterales NOT DETECTED NOT DETECTED Final   Enterobacter cloacae complex NOT DETECTED NOT DETECTED Final   Escherichia coli NOT DETECTED NOT DETECTED Final   Klebsiella aerogenes NOT DETECTED NOT DETECTED Final   Klebsiella oxytoca NOT DETECTED NOT DETECTED Final   Klebsiella pneumoniae NOT DETECTED NOT DETECTED Final   Proteus species NOT DETECTED NOT DETECTED Final   Salmonella species NOT DETECTED NOT DETECTED Final   Serratia marcescens NOT DETECTED NOT DETECTED Final   Haemophilus influenzae NOT DETECTED NOT DETECTED Final   Neisseria meningitidis NOT DETECTED NOT DETECTED Final   Pseudomonas aeruginosa NOT DETECTED NOT DETECTED Final   Stenotrophomonas maltophilia NOT DETECTED NOT DETECTED Final   Candida albicans NOT DETECTED NOT DETECTED Final   Candida auris NOT DETECTED NOT DETECTED Final   Candida glabrata NOT DETECTED NOT DETECTED Final   Candida krusei  NOT DETECTED NOT DETECTED Final   Candida parapsilosis NOT DETECTED NOT DETECTED Final   Candida tropicalis NOT DETECTED NOT DETECTED Final   Cryptococcus neoformans/gattii NOT DETECTED NOT DETECTED Final   Vancomycin resistance NOT DETECTED NOT DETECTED Final    Comment: Performed at [facility], [address] [place], [place] [address]  CULTURE, BLOOD (ROUTINE X 2) w Reflex to ID Panel     Status: None (Preliminary result)   Collection Time: 08/12/21 11:58 PM   Specimen: BLOOD  Result Value Ref Range Status   Specimen Description BLOOD BLOOD LEFT HAND  Final   Special Requests   Final  BOTTLES DRAWN AEROBIC AND ANAEROBIC Blood Culture adequate volume   Culture   Final    NO GROWTH 3 DAYS Performed at [facility], [address] [place], [place] [address]    Report Status PENDING  Incomplete  CULTURE, BLOOD (ROUTINE X 2) w Reflex to ID Panel     Status: None (Preliminary result)   Collection Time: 08/12/21 11:58 PM   Specimen: BLOOD  Result Value Ref Range Status   Specimen Description BLOOD LEFT RADIAL  Final   Special Requests   Final    BOTTLES DRAWN AEROBIC AND ANAEROBIC Blood Culture results may not be optimal due to an inadequate volume of blood received in culture bottles   Culture   Final    NO GROWTH 3 DAYS Performed at [facility], [address], [place], [place] [address]    Report Status PENDING  Incomplete    Coagulation Studies: No results for input(s): LABPROT, INR in the last 72 hours.  Urinalysis: No results for input(s): COLORURINE, LABSPEC, PHURINE, GLUCOSEU, HGBUR, BILIRUBINUR, KETONESUR, PROTEINUR, UROBILINOGEN, NITRITE, LEUKOCYTESUR in the last 72 hours.  Invalid input(s): APPERANCEUR    Imaging: ECHO TEE  Result Date: 08/16/2021    TRANSESOPHOGEAL ECHO REPORT   Patient Name:   [name] [name] Date of Exam: 08/16/2021 Medical Rec #:  [id number]     Height:       73.0 in Accession #:    [id number]    Weight:        200.6 lb Date of Birth:  09/06/1961     BSA:          2.154 m Patient Age:    54 years      BP:           134/78 mmHg Patient Gender: M             HR:           99 bpm. Exam Location:  [facility] Procedure: Transesophageal Echo and Color Doppler Indications:     Endocarditis  History:         Patient has prior history of Echocardiogram examinations, most                  recent 08/13/2021. ESRD; Risk Factors:Hypertension and Current                  Smoker. Hx of polysubstance abuse.  Sonographer:     Charmayne Sheer Referring Phys:  [place] Diagnosing Phys: [place]: The transesophogeal probe was passed without difficulty through the esophogus of the patient. Sedation performed by performing physician. The patient developed no complications during the procedure. IMPRESSIONS  1. Left ventricular ejection fraction, by estimation, is 55 to 60%. The left ventricle has normal function. The left ventricular internal cavity size was mildly dilated.  2. Right ventricular systolic function is mildly reduced. The right ventricular size is mildly enlarged.  3. Left atrial size was moderately dilated. No left atrial/left atrial appendage thrombus was detected.  4. Right atrial size was mild to moderately dilated.  5. 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  6. 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  7. 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 The aortic valve is abnormal. Aortic valve regurgitation is severe. Mild to moderate aortic valve sclerosis/calcification is present, without any evidence of aortic stenosis. Conclusion(s)/Recommendation(s): Findings are concerning for vegetation/infective endocarditis as detailed above.  Findings concerning for aortic valve vegetation. FINDINGS  Left Ventricle: Left ventricular ejection fraction, by estimation, is 55 to 60%. The left ventricle has normal function. The left ventricular  internal cavity size was mildly dilated. Right Ventricle: The right ventricular size is mildly enlarged. No increase in right ventricular wall thickness. Right ventricular systolic function is mildly reduced. Left Atrium: Left atrial size was moderately dilated. No left atrial/left atrial appendage thrombus was detected. Right Atrium: Right atrial size was mild to moderately dilated. Pericardium: There is no evidence of pericardial effusion. 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 The aortic valve is abnormal. Aortic valve regurgitation is severe. Mild to moderate aortic valve sclerosis/calcification is present, without any evidence of aortic stenosis. Pulmonic Valve: The pulmonic valve was normal in structure. Pulmonic valve regurgitation is not visualized. Aorta: The aortic root was not well visualized. IAS/Shunts: No atrial level shunt detected by color flow Doppler. [employer] Electronically signed by Neoma Laming Signature Date/Time: 08/16/2021/9:28:16 AM    Final      Medications:    ampicillin (OMNIPEN) IV 2 g (08/15/21 2030)   cefTRIAXone (ROCEPHIN)  IV 2 g (08/16/21 1128)   sodium chloride     sodium chloride       apixaban  2.5 mg Oral BID   calcium acetate  2,668 mg Oral TID WC   Chlorhexidine Gluconate Cloth  6 each Topical Q0600   epoetin (EPOGEN/PROCRIT) injection  4,000 Units Intravenous Q M,W,F-HD   feeding supplement (NEPRO CARB STEADY)  237 mL Oral TID BM   fentaNYL       lidocaine  2 patch Transdermal Q24H   midazolam       nicotine  21 mg Transdermal Daily   senna-docusate  1 tablet Oral BID   sodium chloride flush       acetaminophen, bisacodyl, hydrALAZINE, LORazepam, Muscle Rub, ondansetron (ZOFRAN) IV, oxyCODONE-acetaminophen, polyethylene  glycol  Assessment/ Plan:  [name] [name] is a 60 y.o.  male with medical conditions including anemia, CHF, hypertension, and ESRD on dialysis. Patient has presented to ED with progressive back pain.  [place]/MWF/Rt AVG  1. End stage renal disease on dialysis Scheduled to receive dialysis this afternoon, UF goal 1.5L, as tolerated Next treatment scheduled for Friday  2. Anemia of chronic kidney disease Lab Results  Component Value Date   HGB 8.8 (L) 08/16/2021  Epogen 4070 with dialysis treatments.  3. Secondary Hyperparathyroidism:  Lab Results  Component Value Date   PTH 583.2 (H) 12/11/2012   CALCIUM 10.9 (H) 08/16/2021   CAION 1.27 (H) 12/26/2012   PHOS 6.2 (H) 08/16/2021  Phosphorus improving Continue calcium acetate with meals  4. 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  Multiple abnormalities noted. ID consult and recs appreciated.  Also abnormality noted within the left psoas muscle.  5. Sepsis believed due to enterococcus faecalis bacteremia ID and Cardiology consulted Antibiotics include ampicillin and ceftriaxone.  TEE completed today shows vegetation on aortic valve suggestive of endocarditis.    LOS: 7   9/7/20221:16 PM

## 2021-08-16 NOTE — Progress Notes (Signed)
SUBJECTIVE: Patient was little confused and mother gave the consent.   Vitals:   08/16/21 0821 08/16/21 0824 08/16/21 0830 08/16/21 0845  BP: 128/68 125/74 120/62 112/64  Pulse: 99 97 96 94  Resp: 15 17 16 16   Temp:      TempSrc:      SpO2: 94% 95% 96% 96%  Weight:      Height:       No intake or output data in the 24 hours ending 08/16/21 0904  LABS: Basic Metabolic Panel: Recent Labs    08/15/21 0446 08/16/21 0429  NA 136 136  K 4.0 4.2  CL 96* 96*  CO2 29 28  GLUCOSE 102* 96  BUN 46* 58*  CREATININE 7.01* 8.68*  CALCIUM 10.1 10.9*  MG 1.9 1.9   Liver Function Tests: No results for input(s): AST, ALT, ALKPHOS, BILITOT, PROT, ALBUMIN in the last 72 hours. No results for input(s): LIPASE, AMYLASE in the last 72 hours. CBC: Recent Labs    08/15/21 0446 08/16/21 0429  WBC 8.5 8.6  HGB 8.7* 8.8*  HCT 27.3* 28.1*  MCV 97.2 95.6  PLT 179 211   Cardiac Enzymes: No results for input(s): CKTOTAL, CKMB, CKMBINDEX, TROPONINI in the last 72 hours. BNP: Invalid input(s): POCBNP D-Dimer: No results for input(s): DDIMER in the last 72 hours. Hemoglobin A1C: No results for input(s): HGBA1C in the last 72 hours. Fasting Lipid Panel: No results for input(s): CHOL, HDL, LDLCALC, TRIG, CHOLHDL, LDLDIRECT in the last 72 hours. Thyroid Function Tests: No results for input(s): TSH, T4TOTAL, T3FREE, THYROIDAB in the last 72 hours.  Invalid input(s): FREET3 Anemia Panel: No results for input(s): VITAMINB12, FOLATE, FERRITIN, TIBC, IRON, RETICCTPCT in the last 72 hours.   PHYSICAL EXAM General: Well developed, well nourished, in no acute distress HEENT:  Normocephalic and atramatic Neck:  No JVD.  Lungs: Clear bilaterally to auscultation and percussion. Heart: HRRR . Normal S1 and S2 without gallops or murmurs.  Abdomen: Bowel sounds are positive, abdomen soft and non-tender  Msk:  Back normal, normal gait. Normal strength and tone for age. Extremities: No clubbing,  cyanosis or edema.   Neuro: Alert and oriented X 3. Psych:  Good affect, responds appropriately  TELEMETRY: Sinus rhythm  ASSESSMENT AND PLAN: Patient had transesophageal echocardiogram which showed vegetation on aortic valve suggestive of endocarditis.  Please review the transesophageal echocardiogram report.  Principal Problem:   Lower back pain Active Problems:   End-stage renal disease on hemodialysis (HCC)   Polysubstance abuse (HCC)   Tobacco abuse   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 (HCC)   Anemia in ESRD (end-stage renal disease) ([place])   HTN (hypertension)   Back pain    Radiance Deady A, MD, [facility] 08/16/2021 9:04 AM

## 2021-08-16 NOTE — Treatment Plan (Signed)
Diagnosis: Enterococcus faecalis Bacteremia, lumbar discitis and endocarditis with ESRD     Allergies  Allergen Reactions   Other     Other reaction(s): Other (See Comments) blister   Tape Other (See Comments)    Plastic Tape Causes blister    OPAT Orders Discharge antibiotics: Ampicillin 2 grams IV every 12 hours Ceftriaxone 2 grams IV Q 12 hours Duration: Total of 6 weeks End Date: 09/23/21  Central line Care Per Protocol:  Labs weekly while on IV antibiotics: _X_ CBC with differential  __X CMP  Line will have to be removed at the end of antibiotic treatment  Fax weekly labs to  [facility] [phone]  Clinic Follow Up Appt: 09/21/21   Call [phone] for any questions

## 2021-08-16 NOTE — Progress Notes (Signed)
Patient ID: [name] [name], male   DOB: Feb 25, 1961, 60 y.o.   MRN: [id number]  PROGRESS NOTE    [name] [name]  IFO:[id number] DOB: [birth year] DOA: 08/08/2021 PCP: Pcp, No   Brief Narrative:  60 y.o. male with medical history significant of ESRD-HD, hypertension, tobacco abuse, polysubstance abuse, diverticulitis, anemia, jugular vein thrombosis, GI bleeding, right pneumothorax presented with worsening lower back pain.  He was found to have sepsis secondary to Enterococcus faecalis bacteremia.  ID was consulted and he has been on ampicillin and ceftriaxone.  He was found to have possible developing osteomyelitis in C5-C6 with possible early left psoas abscess formation and possible early discitis in L4-L5.  Nephrology was also consulted for continuation of dialysis.  Assessment & Plan:   Sepsis: Possibly evolving on admission Enterococcus faecalis bacteremia -ID following.  Currently on Rocephin and ampicillin. -TTE negative for vegetations. 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  Follow further recommendations from ID regarding duration of antibiotic therapy. -Blood cultures from 08/11/2021 grew E faecalis.  Blood cultures from 08/12/2021 have been negative so far.  Severe lower back pain Possible developing osteomyelitis in C5-C6 Left psoas muscle infection with superimposed 1.3 cm phlegmon/early abscess formation Possible early discitis in L4-L5 -Continue antibiotics as above.  Continue pain management and bowel regimen  End-stage renal disease on hemodialysis -Nephrology following.  Dialysis as per nephrology schedule  Anemia of chronic disease -From renal failure.  Hemoglobin stable  Polysubstance abuse and tobacco abuse -Patient was counseled regarding cessation by prior hospitalist.  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 Keturah Barre recommendations.  Continue  Eliquis  Hypertension Hypotension -Blood pressure currently stable.  Off amlodipine  Chronic combined CHF -LVEF of 30 to 35% as per TTE on 08/13/2021.  Volume managed by dialysis.  Strict input output.  Daily weights.  Fluid restriction  Intermittent delirium -Monitor mental status.  Continue Engineer, materials.  Fall precautions.  Generalized conditioning -PT/OT recommend SNF placement.  Social worker following.  DVT prophylaxis: Eliquis Code Status: Full Family Communication: None at bedside Disposition Plan: Status is: Inpatient  Remains inpatient appropriate because:Inpatient level of care appropriate due to severity of illness  Dispo: The patient is from: Home              Anticipated d/c is to: SNF              Patient currently is not medically stable to d/c.   Difficult to place patient No  Consultants: ID/cardiology  Procedures: TTE/TEE  Antimicrobials:  Anti-infectives (From admission, onward)    Start     Dose/Rate Route Frequency Ordered Stop   08/14/21 1530  cefTRIAXone (ROCEPHIN) 2 g in sodium chloride 0.9 % 100 mL IVPB        2 g 200 mL/hr over 30 Minutes Intravenous Every 12 hours 08/14/21 1443     08/14/21 1200  vancomycin (VANCOCIN) IVPB 1000 mg/200 mL premix  Status:  Discontinued        1,000 mg 200 mL/hr over 60 Minutes Intravenous Every M-W-F (Hemodialysis) 08/11/21 1831 08/12/21 0517   08/12/21 2200  cefTRIAXone (ROCEPHIN) 2 g in sodium chloride 0.9 % 100 mL IVPB  Status:  Discontinued        2 g 200 mL/hr over 30 Minutes Intravenous Every 12 hours 08/12/21 1923 08/14/21 1443   08/12/21 0615  ampicillin (OMNIPEN) 2 g in sodium  chloride 0.9 % 100 mL IVPB        2 g 300 mL/hr over 20 Minutes Intravenous Every 12 hours 08/12/21 0520     08/11/21 1930  vancomycin (VANCOREADY) IVPB 2000 mg/400 mL        2,000 mg 200 mL/hr over 120 Minutes Intravenous  Once 08/11/21 1831 08/12/21 0156   08/11/21 1900  cefTRIAXone (ROCEPHIN) 2 g in sodium chloride 0.9 %  100 mL IVPB  Status:  Discontinued        2 g 200 mL/hr over 30 Minutes Intravenous Every 12 hours 08/11/21 1829 08/12/21 0517        Subjective: Patient seen and examined at bedside.  He is poor historian.  Denies worsening shortness of breath, chest pain.  No vomiting reported.  Objective: Vitals:   08/16/21 0845 08/16/21 0945 08/16/21 1132 08/16/21 1302  BP: 112/64 123/67 (!) 120/56 131/63  Pulse: 94 96 97 98  Resp: 16 20 16 16   Temp:  98.7 F (37.1 C) 98.2 F (36.8 C) 98.3 F (36.8 C)  TempSrc:      SpO2: 96% 100% 99% 100%  Weight:      Height:        Intake/Output Summary (Last 24 hours) at 08/16/2021 1303 Last data filed at 08/16/2021 1251 Gross per 24 hour  Intake 360 ml  Output --  Net 360 ml   Filed Weights   08/08/21 0625 08/16/21 0754  Weight: 91 kg 91 kg    Examination:  General exam: Appears calm and comfortable.  Currently on 2 L oxygen via nasal cannula. Respiratory system: Bilateral decreased breath sounds at bases with some scattered crackles Cardiovascular system: S1 & S2 heard, Rate controlled Gastrointestinal system: Abdomen is nondistended, soft and nontender. Normal bowel sounds heard. Extremities: No cyanosis, clubbing similar trace lower extremity edema Central nervous system: Awake, very slow to respond.  Poor historian.  No focal neurological deficits. Moving extremities Skin: No rashes, lesions or ulcers Psychiatry: Extremely flat affect.  Does not participate in conversation much   Data Reviewed: I have personally reviewed following labs and imaging studies  CBC: Recent Labs  Lab 08/12/21 0613 08/13/21 0827 08/14/21 0614 08/15/21 0446 08/16/21 0429  WBC 9.6 8.8 8.2 8.5 8.6  HGB 9.2* 8.4* 8.6* 8.7* 8.8*  HCT 30.2* 26.9* 28.7* 27.3* 28.1*  MCV 96.2 97.8 96.0 97.2 95.6  PLT 143* 138* 180 179 287   Basic Metabolic Panel: Recent Labs  Lab 08/11/21 0226 08/12/21 0613 08/13/21 0827 08/14/21 0614 08/15/21 0446 08/16/21 0429   NA 136 138 136 135 136 136  K 5.0 4.9 4.6 4.8 4.0 4.2  CL 99 98 97* 96* 96* 96*  CO2 28 28 29 28 29 28   GLUCOSE 89 94 90 111* 102* 96  BUN 65* 56* 55* 65* 46* 58*  CREATININE 9.61* 8.52* 7.92* 9.28* 7.01* 8.68*  CALCIUM 9.9 9.6 10.0 10.0 10.1 10.9*  MG 2.2 2.0 1.9 2.0 1.9 1.9  PHOS 7.6*  --   --   --   --  6.2*   GFR: Estimated Creatinine Clearance: 10.6 mL/min (A) (by C-G formula based on SCr of 8.68 mg/dL (H)). Liver Function Tests: No results for input(s): AST, ALT, ALKPHOS, BILITOT, PROT, ALBUMIN in the last 168 hours. No results for input(s): LIPASE, AMYLASE in the last 168 hours. No results for input(s): AMMONIA in the last 168 hours. Coagulation Profile: No results for input(s): INR, PROTIME in the last 168 hours. Cardiac Enzymes: No results for  input(s): CKTOTAL, CKMB, CKMBINDEX, TROPONINI in the last 168 hours. BNP (last 3 results) No results for input(s): PROBNP in the last 8760 hours. HbA1C: No results for input(s): HGBA1C in the last 72 hours. CBG: Recent Labs  Lab 08/14/21 0742 08/14/21 1639  GLUCAP 93 346*   Lipid Profile: No results for input(s): CHOL, HDL, LDLCALC, TRIG, CHOLHDL, LDLDIRECT in the last 72 hours. Thyroid Function Tests: No results for input(s): TSH, T4TOTAL, FREET4, T3FREE, THYROIDAB in the last 72 hours. Anemia Panel: No results for input(s): VITAMINB12, FOLATE, FERRITIN, TIBC, IRON, RETICCTPCT in the last 72 hours. Sepsis Labs: Recent Labs  Lab 08/11/21 0226 08/11/21 1747 08/11/21 2002 08/12/21 0613 08/13/21 0827  PROCALCITON 5.82  --   --  5.15 4.61  LATICACIDVEN  --  0.9 1.1  --   --     Recent Results (from the past 240 hour(s))  Resp Panel by RT-PCR (Flu A&B, Covid) Nasopharyngeal Swab     Status: None   Collection Time: 08/08/21  1:22 PM   Specimen: Nasopharyngeal Swab; Nasopharyngeal(NP) swabs in vial transport medium  Result Value Ref Range Status   SARS Coronavirus 2 by RT PCR NEGATIVE NEGATIVE Final    Comment:  (NOTE) SARS-CoV-2 target nucleic acids are NOT DETECTED.  The SARS-CoV-2 RNA is generally detectable in upper respiratory specimens during the acute phase of infection. The lowest concentration of SARS-CoV-2 viral copies this assay can detect is 138 copies/mL. A negative result does not preclude SARS-Cov-2 infection and should not be used as the sole basis for treatment or other patient management decisions. A negative result may occur with  improper specimen collection/handling, submission of specimen other than nasopharyngeal swab, presence of viral mutation(s) within the areas targeted by this assay, and inadequate number of viral copies(<138 copies/mL). A negative result must be combined with clinical observations, patient history, and epidemiological information. The expected result is Negative.  Fact Sheet for Patients:  EntrepreneurPulse.com.au  Fact Sheet for Healthcare Providers:  IncredibleEmployment.be  This test is no t yet approved or cleared by the [place] FDA and  has been authorized for detection and/or diagnosis of SARS-CoV-2 by FDA under an Emergency Use Authorization (EUA). This EUA will remain  in effect (meaning this test can be used) for the duration of the COVID-19 declaration under Section 564(b)(1) of the Act, 21 U.S.C.section 360bbb-3(b)(1), unless the authorization is terminated  or revoked sooner.       Influenza A by PCR NEGATIVE NEGATIVE Final   Influenza B by PCR NEGATIVE NEGATIVE Final    Comment: (NOTE) The Xpert Xpress SARS-CoV-2/FLU/RSV plus assay is intended as an aid in the diagnosis of influenza from Nasopharyngeal swab specimens and should not be used as a sole basis for treatment. Nasal washings and aspirates are unacceptable for Xpert Xpress SARS-CoV-2/FLU/RSV testing.  Fact Sheet for Patients: EntrepreneurPulse.com.au  Fact Sheet for Healthcare  Providers: IncredibleEmployment.be  This test is not yet approved or cleared by the [place] FDA and has been authorized for detection and/or diagnosis of SARS-CoV-2 by FDA under an Emergency Use Authorization (EUA). This EUA will remain in effect (meaning this test can be used) for the duration of the COVID-19 declaration under Section 564(b)(1) of the Act, 21 U.S.C. section 360bbb-3(b)(1), unless the authorization is terminated or revoked.  Performed at [facility], [address] [place], [place] [address]   Culture, blood (Routine X 2) w Reflex to ID Panel     Status: Abnormal   Collection Time: 08/11/21  5:47 PM   Specimen: BLOOD  Result Value Ref Range Status   Specimen Description   Final    BLOOD LEFT HAND Performed at [facility], [address] [place], [place] [address]    Special Requests   Final    BOTTLES DRAWN AEROBIC AND ANAEROBIC Blood Culture adequate volume Performed at [facility], [address] [place], [place] [address]    Culture  Setup Time   Final    GRAM POSITIVE COCCI IN CHAINS IN BOTH AEROBIC AND ANAEROBIC BOTTLES Gram Stain Report Called to,Read Back By and Verified With: JASON ROBBINS 08/12/21 @ 0507 BY SB Performed at [facility], [address] [place], [place] [address]    Culture (A)  Final    ENTEROCOCCUS FAECALIS SUSCEPTIBILITIES PERFORMED ON PREVIOUS CULTURE WITHIN THE LAST 5 DAYS. Performed at [facility], [address] [place], [place] [address]    Report Status 08/14/2021 FINAL  Final  Culture, blood (Routine X 2) w Reflex to ID Panel     Status: Abnormal   Collection Time: 08/11/21  5:47 PM   Specimen: BLOOD  Result Value Ref Range Status   Specimen Description   Final    BLOOD FOA Performed at [facility], [address] [place], [place] [address]    Special Requests   Final    BOTTLES DRAWN AEROBIC AND ANAEROBIC Blood Culture adequate  volume Performed at [facility], [address] [place], [place] [address]    Culture  Setup Time   Final    GRAM POSITIVE COCCI IN CHAINS IN BOTH AEROBIC AND ANAEROBIC BOTTLES Gram Stain Report Called to,Read Back By and Verified With: JASON ROBBINS 08/12/21 @ 0507 BY SB Performed at [facility], [place] [address] [place],  [address]    Culture ENTEROCOCCUS FAECALIS (A)  Final   Report Status 08/14/2021 FINAL  Final   Organism ID, Bacteria ENTEROCOCCUS FAECALIS  Final      Susceptibility   Enterococcus faecalis - MIC*    AMPICILLIN <=2 SENSITIVE Sensitive     VANCOMYCIN 2 SENSITIVE Sensitive     GENTAMICIN SYNERGY SENSITIVE Sensitive     * ENTEROCOCCUS FAECALIS  Blood Culture ID Panel (Reflexed)     Status: Abnormal   Collection Time: 08/11/21  5:47 PM  Result Value Ref Range Status   Enterococcus faecalis DETECTED (A) NOT DETECTED Final    Comment: CRITICAL RESULT CALLED TO, READ BACK BY AND VERIFIED WITH: JASON ROBBINS 08/12/21 @ 0507 BY SB    Enterococcus Faecium NOT DETECTED NOT DETECTED Final   Listeria monocytogenes NOT DETECTED NOT DETECTED Final   Staphylococcus species NOT DETECTED NOT DETECTED Final   Staphylococcus aureus (BCID) NOT DETECTED NOT DETECTED Final   Staphylococcus epidermidis NOT DETECTED NOT DETECTED Final   Staphylococcus lugdunensis NOT DETECTED NOT DETECTED Final   Streptococcus species NOT DETECTED NOT DETECTED Final   Streptococcus agalactiae NOT DETECTED NOT DETECTED Final   Streptococcus pneumoniae NOT DETECTED NOT DETECTED Final   Streptococcus pyogenes NOT DETECTED NOT DETECTED Final   A.calcoaceticus-baumannii NOT DETECTED NOT DETECTED Final   Bacteroides fragilis NOT DETECTED NOT DETECTED Final   Enterobacterales NOT DETECTED NOT DETECTED Final   Enterobacter cloacae complex NOT DETECTED NOT DETECTED Final   Escherichia coli NOT DETECTED NOT DETECTED Final   Klebsiella aerogenes NOT DETECTED NOT DETECTED Final    Klebsiella oxytoca NOT DETECTED NOT DETECTED Final   Klebsiella pneumoniae NOT DETECTED NOT DETECTED Final  Proteus species NOT DETECTED NOT DETECTED Final   Salmonella species NOT DETECTED NOT DETECTED Final   Serratia marcescens NOT DETECTED NOT DETECTED Final   Haemophilus influenzae NOT DETECTED NOT DETECTED Final   Neisseria meningitidis NOT DETECTED NOT DETECTED Final   Pseudomonas aeruginosa NOT DETECTED NOT DETECTED Final   Stenotrophomonas maltophilia NOT DETECTED NOT DETECTED Final   Candida albicans NOT DETECTED NOT DETECTED Final   Candida auris NOT DETECTED NOT DETECTED Final   Candida glabrata NOT DETECTED NOT DETECTED Final   Candida krusei NOT DETECTED NOT DETECTED Final   Candida parapsilosis NOT DETECTED NOT DETECTED Final   Candida tropicalis NOT DETECTED NOT DETECTED Final   Cryptococcus neoformans/gattii NOT DETECTED NOT DETECTED Final   Vancomycin resistance NOT DETECTED NOT DETECTED Final    Comment: Performed at [facility], [name], [place] [address]  CULTURE, BLOOD (ROUTINE X 2) w Reflex to ID Panel     Status: None (Preliminary result)   Collection Time: 08/12/21 11:58 PM   Specimen: BLOOD  Result Value Ref Range Status   Specimen Description BLOOD BLOOD LEFT HAND  Final   Special Requests   Final    BOTTLES DRAWN AEROBIC AND ANAEROBIC Blood Culture adequate volume   Culture   Final    NO GROWTH 3 DAYS Performed at [facility], [address] [place], [place] [address]    Report Status PENDING  Incomplete  CULTURE, BLOOD (ROUTINE X 2) w Reflex to ID Panel     Status: None (Preliminary result)   Collection Time: 08/12/21 11:58 PM   Specimen: BLOOD  Result Value Ref Range Status   Specimen Description BLOOD LEFT RADIAL  Final   Special Requests   Final    BOTTLES DRAWN AEROBIC AND ANAEROBIC Blood Culture results may not be optimal due to an inadequate volume of blood received in culture bottles   Culture   Final     NO GROWTH 3 DAYS Performed at [facility], [address], [place], [place] [address]    Report Status PENDING  Incomplete         Radiology Studies: ECHO TEE  Result Date: 08/16/2021    TRANSESOPHOGEAL ECHO REPORT   Patient Name:   [name] [name] Date of Exam: 08/16/2021 Medical Rec #:  [id number]     Height:       73.0 in Accession #:    [id number]    Weight:       200.6 lb Date of Birth:  09-Mar-1961     BSA:          2.154 m Patient Age:    61 years      BP:           134/78 mmHg Patient Gender: M             HR:           99 bpm. Exam Location:  [facility] Procedure: Transesophageal Echo and Color Doppler Indications:     Endocarditis  History:         Patient has prior history of Echocardiogram examinations, most                  recent 08/13/2021. ESRD; Risk Factors:Hypertension and Current                  Smoker. Hx of polysubstance abuse.  Sonographer:     Charmayne Sheer Referring Phys:  Christine Diagnosing  Phys: [place]: The transesophogeal probe was passed without difficulty through the esophogus of the patient. Sedation performed by performing physician. The patient developed no complications during the procedure. IMPRESSIONS  1. Left ventricular ejection fraction, by estimation, is 55 to 60%. The left ventricle has normal function. The left ventricular internal cavity size was mildly dilated.  2. Right ventricular systolic function is mildly reduced. The right ventricular size is mildly enlarged.  3. Left atrial size was moderately dilated. No left atrial/left atrial appendage thrombus was detected.  4. Right atrial size was mild to moderately dilated.  5. 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  6. 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  7. 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 The aortic valve is abnormal. Aortic valve regurgitation is severe. Mild to moderate aortic valve  sclerosis/calcification is present, without any evidence of aortic stenosis. Conclusion(s)/Recommendation(s): Findings are concerning for vegetation/infective endocarditis as detailed above. Findings concerning for aortic valve vegetation. FINDINGS  Left Ventricle: Left ventricular ejection fraction, by estimation, is 55 to 60%. The left ventricle has normal function. The left ventricular internal cavity size was mildly dilated. Right Ventricle: The right ventricular size is mildly enlarged. No increase in right ventricular wall thickness. Right ventricular systolic function is mildly reduced. Left Atrium: Left atrial size was moderately dilated. No left atrial/left atrial appendage thrombus was detected. Right Atrium: Right atrial size was mild to moderately dilated. Pericardium: There is no evidence of pericardial effusion. 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 The aortic valve is abnormal. Aortic valve regurgitation is severe. Mild to moderate aortic valve sclerosis/calcification is present, without any evidence of aortic stenosis. Pulmonic Valve: The pulmonic valve was normal in structure. Pulmonic valve regurgitation is not visualized. Aorta: The aortic root was not well visualized. IAS/Shunts: No atrial level shunt detected by color flow Doppler. [place] Electronically signed by Neoma Laming Signature Date/Time: 08/16/2021/9:28:16 AM    Final         Scheduled Meds:  apixaban  2.5 mg Oral BID   calcium acetate  2,668 mg Oral TID WC   Chlorhexidine Gluconate Cloth  6 each Topical Q0600   docusate sodium  100 mg Oral BID   epoetin (EPOGEN/PROCRIT) injection  4,000 Units Intravenous Q M,W,F-HD   feeding supplement (NEPRO CARB STEADY)  237 mL Oral TID BM   fentaNYL       lidocaine  2  patch Transdermal Q24H   midazolam       nicotine  21 mg Transdermal Daily   senna-docusate  1 tablet Oral BID   sodium chloride flush       Continuous Infusions:  ampicillin (OMNIPEN) IV 2 g (08/15/21 2030)   cefTRIAXone (ROCEPHIN)  IV 2 g (08/16/21 1128)   sodium chloride     sodium chloride            Aline August, MD [facility] 08/16/2021, 1:03 PM

## 2021-08-16 NOTE — Progress Notes (Signed)
Date of Admission:  08/08/2021     ID: [name] [name] is a 60 y.o. male  Principal Problem:   Lower back pain Active Problems:   End-stage renal disease on hemodialysis (HCC)   Polysubstance abuse ([place])   Tobacco abuse   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 (HCC)   Anemia in ESRD (end-stage renal disease) (HCC)   HTN (hypertension)   Back pain    Subjective: Pt had TEE today Somnolent post sedation  Medications:   apixaban  2.5 mg Oral BID   calcium acetate  2,668 mg Oral TID WC   Chlorhexidine Gluconate Cloth  6 each Topical Q0600   epoetin (EPOGEN/PROCRIT) injection  4,000 Units Intravenous Q M,W,F-HD   feeding supplement (NEPRO CARB STEADY)  237 mL Oral TID BM   fentaNYL       lidocaine  2 patch Transdermal Q24H   midazolam       nicotine  21 mg Transdermal Daily   senna-docusate  1 tablet Oral BID   sodium chloride flush        Objective: Vital signs in last 24 hours: Temp:  [97.9 F (36.6 C)-98.7 F (37.1 C)] 98.3 F (36.8 C) (09/07 1302) Pulse Rate:  [94-104] 98 (09/07 1302) Resp:  [15-20] 16 (09/07 1302) BP: (112-134)/(52-74) 131/63 (09/07 1302) SpO2:  [90 %-100 %] 100 % (09/07 1302) Weight:  [91 kg] 91 kg (09/07 0754)  PHYSICAL EXAM:  General: slepeing Lungs: Clear to auscultation bilaterally. No Wheezing or Rhonchi. No rales. Heart: s1s2 Abdomen: Soft, non-tender,not distended. Bowel sounds normal. No masses Extremities: rt AV f Skin: No rashes or lesions. Or bruising Neurologic: cannot assess  Lab Results Recent Labs    08/15/21 0446 08/16/21 0429  WBC 8.5 8.6  HGB 8.7* 8.8*  HCT 27.3* 28.1*  NA 136 136  K 4.0 4.2  CL 96* 96*  CO2 29 28  BUN 46* 58*  CREATININE 7.01* 8.68*    Microbiology: 08/11/21 Enterococcus in blood culture 08/12/21 BC- NG  Studies/Results: ECHO TEE  Result Date: 08/16/2021    TRANSESOPHOGEAL ECHO REPORT   Patient Name:   TAESEAN RETH Date of Exam: 08/16/2021 Medical Rec #:  [id number]     Height:        73.0 in Accession #:    [id number]    Weight:       200.6 lb Date of Birth:  1961-03-12     BSA:          2.154 m Patient Age:    27 years      BP:           134/78 mmHg Patient Gender: M             HR:           99 bpm. Exam Location:  [facility] Procedure: Transesophageal Echo and Color Doppler Indications:     Endocarditis  History:         Patient has prior history of Echocardiogram examinations, most                  recent 08/13/2021. ESRD; Risk Factors:Hypertension and Current                  Smoker. Hx of polysubstance abuse.  Sonographer:     Charmayne Sheer Referring Phys:  [place] Diagnosing Phys: [place]: The transesophogeal probe was passed without difficulty through the esophogus of the patient. Sedation  performed by performing physician. The patient developed no complications during the procedure. IMPRESSIONS  1. Left ventricular ejection fraction, by estimation, is 55 to 60%. The left ventricle has normal function. The left ventricular internal cavity size was mildly dilated.  2. Right ventricular systolic function is mildly reduced. The right ventricular size is mildly enlarged.  3. Left atrial size was moderately dilated. No left atrial/left atrial appendage thrombus was detected.  4. Right atrial size was mild to moderately dilated.  5. 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  6. 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  7. 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 The aortic valve is abnormal. Aortic valve regurgitation is severe. Mild to moderate aortic valve sclerosis/calcification is present, without any evidence of aortic stenosis. Conclusion(s)/Recommendation(s): Findings are concerning for vegetation/infective endocarditis as detailed above. Findings concerning for aortic valve vegetation. FINDINGS  Left Ventricle: Left ventricular ejection fraction, by estimation, is 55 to 60%. The  left ventricle has normal function. The left ventricular internal cavity size was mildly dilated. Right Ventricle: The right ventricular size is mildly enlarged. No increase in right ventricular wall thickness. Right ventricular systolic function is mildly reduced. Left Atrium: Left atrial size was moderately dilated. No left atrial/left atrial appendage thrombus was detected. Right Atrium: Right atrial size was mild to moderately dilated. Pericardium: There is no evidence of pericardial effusion. 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 The aortic valve is abnormal. Aortic valve regurgitation is severe. Mild to moderate aortic valve sclerosis/calcification is present, without any evidence of aortic stenosis. Pulmonic Valve: The pulmonic valve was normal in structure. Pulmonic valve regurgitation is not visualized. Aorta: The aortic root was not well visualized. IAS/Shunts: No atrial level shunt detected by color flow Doppler. [place] Electronically signed by Neoma Laming Signature Date/Time: 08/16/2021/9:28:16 AM    Final      Assessment/Plan: Admitted with low back pain  Enterococcus bacteremia  L4-L5 discitis and possible psoas phlegmon  C5-c6 marrow edema for possible evloving osteomyelitis  Aortic valve endocarditis Pt is currently on ceftriaxone + ampicillin- will need for a total of 6-8 weeks  ESRD   Discuss with nephrology regarding placement of PICC line VS hohn catheter for IV antibiotic  Discussed the management with the pharmacist

## 2021-08-16 NOTE — Progress Notes (Signed)
*  PRELIMINARY RESULTS* Echocardiogram Echocardiogram Transesophageal has been performed.  [place] 08/16/2021, 9:00 AM

## 2021-08-17 DIAGNOSIS — I82C19 Acute embolism and thrombosis of unspecified internal jugular vein: Secondary | ICD-10-CM | POA: Diagnosis not present

## 2021-08-17 DIAGNOSIS — I358 Other nonrheumatic aortic valve disorders: Secondary | ICD-10-CM | POA: Diagnosis not present

## 2021-08-17 DIAGNOSIS — N186 End stage renal disease: Secondary | ICD-10-CM | POA: Diagnosis not present

## 2021-08-17 DIAGNOSIS — F1910 Other psychoactive substance abuse, uncomplicated: Secondary | ICD-10-CM | POA: Diagnosis not present

## 2021-08-17 DIAGNOSIS — R7881 Bacteremia: Secondary | ICD-10-CM | POA: Diagnosis not present

## 2021-08-17 DIAGNOSIS — M4646 Discitis, unspecified, lumbar region: Secondary | ICD-10-CM | POA: Diagnosis not present

## 2021-08-17 DIAGNOSIS — M5440 Lumbago with sciatica, unspecified side: Secondary | ICD-10-CM | POA: Diagnosis not present

## 2021-08-17 DIAGNOSIS — B952 Enterococcus as the cause of diseases classified elsewhere: Secondary | ICD-10-CM | POA: Diagnosis not present

## 2021-08-17 LAB — BASIC METABOLIC PANEL
Anion gap: 9 (ref 5–15)
BUN: 38 mg/dL — ABNORMAL HIGH (ref 6–20)
CO2: 30 mmol/L (ref 22–32)
Calcium: 10.2 mg/dL (ref 8.9–10.3)
Chloride: 96 mmol/L — ABNORMAL LOW (ref 98–111)
Creatinine, Ser: 7.01 mg/dL — ABNORMAL HIGH (ref 0.61–1.24)
GFR, Estimated: 8 mL/min — ABNORMAL LOW (ref >60)
Glucose, Bld: 95 mg/dL (ref 70–99)
Potassium: 3.9 mmol/L (ref 3.5–5.1)
Sodium: 135 mmol/L (ref 135–145)

## 2021-08-17 LAB — CBC
HCT: 24.5 % — ABNORMAL LOW (ref 39.0–52.0)
Hemoglobin: 7.8 g/dL — ABNORMAL LOW (ref 13.0–17.0)
MCH: 30.6 pg (ref 26.0–34.0)
MCHC: 31.8 g/dL (ref 30.0–36.0)
MCV: 96.1 fL (ref 80.0–100.0)
Platelets: 195 10*3/uL (ref 150–400)
RBC: 2.55 MIL/uL — ABNORMAL LOW (ref 4.22–5.81)
RDW: 17.1 % — ABNORMAL HIGH (ref 11.5–15.5)
WBC: 7.7 10*3/uL (ref 4.0–10.5)
nRBC: 0 % (ref 0.0–0.2)

## 2021-08-17 LAB — C-REACTIVE PROTEIN: CRP: 15.3 mg/dL — ABNORMAL HIGH (ref <1.0)

## 2021-08-17 LAB — MAGNESIUM: Magnesium: 1.9 mg/dL (ref 1.7–2.4)

## 2021-08-17 NOTE — Progress Notes (Signed)
[facility]  ROUNDING NOTE   Subjective:   [name] [name] is a 60 y.o. male with medical conditions including anemia, CHF, hypertension, and ESRD on dialysis. Patient has presented to ED with progressive back pain.   Patient states he has always had back pain but it has increased in the past few days. Denies known precipitating factors. Chest X-ray of lumbar spine shows multilevel degenerative changes and stenosis, greatest at L4-L5.   Patient is known to our practice and receives dialysis at [facility] supervised by Dr Juleen China. We have been consulted to manage dialysis treatments.   Patient seen resting in bed Alert, withdrawn Safety sitter at bedside No complaints reported at this time  Dialysis yesterday, tolerated well   Objective:  Vital signs in last 24 hours:  Temp:  [98.2 F (36.8 C)-99 F (37.2 C)] 98.5 F (36.9 C) (09/08 0826) Pulse Rate:  [89-121] 89 (09/08 0826) Resp:  [13-27] 17 (09/08 0826) BP: (92-147)/(50-79) 108/50 (09/08 0826) SpO2:  [83 %-100 %] 100 % (09/08 0826)  Weight change:  Filed Weights   08/08/21 0625 08/16/21 0754  Weight: 91 kg 91 kg    Intake/Output: I/O last 3 completed shifts: In: 360 [P.O.:360] Out: 1183 [Other:1183]   Intake/Output this shift:  Total I/O In: 240 [P.O.:240] Out: -   Physical Exam: General: NAD, resting in bed  Head: Normocephalic, atraumatic. Moist oral mucosal membranes  Eyes: Anicteric  Lungs:  Clear to auscultation, normal effort  Heart: Regular rate and rhythm  Abdomen:  Soft, nontender, distended  Extremities: Mild right upper extremity swelling, no lower extremity edema  Neurologic: Alert, moving all four extremities  Skin: No lesions  Access: Rt AVG, swelling noted in right upper extremity    Basic Metabolic Panel: Recent Labs  Lab 08/11/21 0226 08/12/21 0613 08/13/21 0827 08/14/21 0614 08/15/21 0446 08/16/21 0429 08/17/21 0449  NA 136   < > 136 135 136 136 135  K 5.0    < > 4.6 4.8 4.0 4.2 3.9  CL 99   < > 97* 96* 96* 96* 96*  CO2 28   < > 29 28 29 28 30   GLUCOSE 89   < > 90 111* 102* 96 95  BUN 65*   < > 55* 65* 46* 58* 38*  CREATININE 9.61*   < > 7.92* 9.28* 7.01* 8.68* 7.01*  CALCIUM 9.9   < > 10.0 10.0 10.1 10.9* 10.2  MG 2.2   < > 1.9 2.0 1.9 1.9 1.9  PHOS 7.6*  --   --   --   --  6.2*  --    < > = values in this interval not displayed.     Liver Function Tests: No results for input(s): AST, ALT, ALKPHOS, BILITOT, PROT, ALBUMIN in the last 168 hours.  No results for input(s): LIPASE, AMYLASE in the last 168 hours. No results for input(s): AMMONIA in the last 168 hours.  CBC: Recent Labs  Lab 08/13/21 0827 08/14/21 0614 08/15/21 0446 08/16/21 0429 08/17/21 0449  WBC 8.8 8.2 8.5 8.6 7.7  HGB 8.4* 8.6* 8.7* 8.8* 7.8*  HCT 26.9* 28.7* 27.3* 28.1* 24.5*  MCV 97.8 96.0 97.2 95.6 96.1  PLT 138* 180 179 211 195     Cardiac Enzymes: No results for input(s): CKTOTAL, CKMB, CKMBINDEX, TROPONINI in the last 168 hours.  BNP: Invalid input(s): POCBNP  CBG: Recent Labs  Lab 08/14/21 0742 08/14/21 1639  GLUCAP 93 346*     Microbiology: Results  for orders placed or performed during the hospital encounter of 08/08/21  Resp Panel by RT-PCR (Flu A&B, Covid) Nasopharyngeal Swab     Status: None   Collection Time: 08/08/21  1:22 PM   Specimen: Nasopharyngeal Swab; Nasopharyngeal(NP) swabs in vial transport medium  Result Value Ref Range Status   SARS Coronavirus 2 by RT PCR NEGATIVE NEGATIVE Final    Comment: (NOTE) SARS-CoV-2 target nucleic acids are NOT DETECTED.  The SARS-CoV-2 RNA is generally detectable in upper respiratory specimens during the acute phase of infection. The lowest concentration of SARS-CoV-2 viral copies this assay can detect is 138 copies/mL. A negative result does not preclude SARS-Cov-2 infection and should not be used as the sole basis for treatment or other patient management decisions. A negative result may  occur with  improper specimen collection/handling, submission of specimen other than nasopharyngeal swab, presence of viral mutation(s) within the areas targeted by this assay, and inadequate number of viral copies(<138 copies/mL). A negative result must be combined with clinical observations, patient history, and epidemiological information. The expected result is Negative.  Fact Sheet for Patients:  EntrepreneurPulse.com.au  Fact Sheet for Healthcare Providers:  IncredibleEmployment.be  This test is no t yet approved or cleared by the [place] FDA and  has been authorized for detection and/or diagnosis of SARS-CoV-2 by FDA under an Emergency Use Authorization (EUA). This EUA will remain  in effect (meaning this test can be used) for the duration of the COVID-19 declaration under Section 564(b)(1) of the Act, 21 U.S.C.section 360bbb-3(b)(1), unless the authorization is terminated  or revoked sooner.       Influenza A by PCR NEGATIVE NEGATIVE Final   Influenza B by PCR NEGATIVE NEGATIVE Final    Comment: (NOTE) The Xpert Xpress SARS-CoV-2/FLU/RSV plus assay is intended as an aid in the diagnosis of influenza from Nasopharyngeal swab specimens and should not be used as a sole basis for treatment. Nasal washings and aspirates are unacceptable for Xpert Xpress SARS-CoV-2/FLU/RSV testing.  Fact Sheet for Patients: EntrepreneurPulse.com.au  Fact Sheet for Healthcare Providers: IncredibleEmployment.be  This test is not yet approved or cleared by the [place] FDA and has been authorized for detection and/or diagnosis of SARS-CoV-2 by FDA under an Emergency Use Authorization (EUA). This EUA will remain in effect (meaning this test can be used) for the duration of the COVID-19 declaration under Section 564(b)(1) of the Act, 21 U.S.C. section 360bbb-3(b)(1), unless the authorization is terminated  or revoked.  Performed at [facility], [address] [place], [place] [address]   Culture, blood (Routine X 2) w Reflex to ID Panel     Status: Abnormal   Collection Time: 08/11/21  5:47 PM   Specimen: BLOOD  Result Value Ref Range Status   Specimen Description   Final    BLOOD LEFT HAND Performed at [facility], [address], [place], [place] [address]    Special Requests   Final    BOTTLES DRAWN AEROBIC AND ANAEROBIC Blood Culture adequate volume Performed at [facility], [address] [place], [place] [address]    Culture  Setup Time   Final    GRAM POSITIVE COCCI IN CHAINS IN BOTH AEROBIC AND ANAEROBIC BOTTLES Gram Stain Report Called to,Read Back By and Verified With: JASON ROBBINS 08/12/21 @ 0507 BY SB Performed at [facility], [address] [place], [place] [address]    Culture (A)  Final    ENTEROCOCCUS FAECALIS SUSCEPTIBILITIES PERFORMED ON PREVIOUS CULTURE WITHIN  THE LAST 5 DAYS. Performed at [facility], [place] [address], [place], [place] [address]    Report Status 08/14/2021 FINAL  Final  Culture, blood (Routine X 2) w Reflex to ID Panel     Status: Abnormal   Collection Time: 08/11/21  5:47 PM   Specimen: BLOOD  Result Value Ref Range Status   Specimen Description   Final    BLOOD FOA Performed at [facility], [address], [place], [place] [address]    Special Requests   Final    BOTTLES DRAWN AEROBIC AND ANAEROBIC Blood Culture adequate volume Performed at [facility], [address] [place], [place] [address]    Culture  Setup Time   Final    GRAM POSITIVE COCCI IN CHAINS IN BOTH AEROBIC AND ANAEROBIC BOTTLES Gram Stain Report Called to,Read Back By and Verified With: JASON ROBBINS 08/12/21 @ 0507 BY SB Performed at [facility], [place] [address], [place], [place] [address]    Culture ENTEROCOCCUS FAECALIS (A)  Final   Report Status 08/14/2021 FINAL  Final   Organism ID,  Bacteria ENTEROCOCCUS FAECALIS  Final      Susceptibility   Enterococcus faecalis - MIC*    AMPICILLIN <=2 SENSITIVE Sensitive     VANCOMYCIN 2 SENSITIVE Sensitive     GENTAMICIN SYNERGY SENSITIVE Sensitive     * ENTEROCOCCUS FAECALIS  Blood Culture ID Panel (Reflexed)     Status: Abnormal   Collection Time: 08/11/21  5:47 PM  Result Value Ref Range Status   Enterococcus faecalis DETECTED (A) NOT DETECTED Final    Comment: CRITICAL RESULT CALLED TO, READ BACK BY AND VERIFIED WITH: JASON ROBBINS 08/12/21 @ 0507 BY SB    Enterococcus Faecium NOT DETECTED NOT DETECTED Final   Listeria monocytogenes NOT DETECTED NOT DETECTED Final   Staphylococcus species NOT DETECTED NOT DETECTED Final   Staphylococcus aureus (BCID) NOT DETECTED NOT DETECTED Final   Staphylococcus epidermidis NOT DETECTED NOT DETECTED Final   Staphylococcus lugdunensis NOT DETECTED NOT DETECTED Final   Streptococcus species NOT DETECTED NOT DETECTED Final   Streptococcus agalactiae NOT DETECTED NOT DETECTED Final   Streptococcus pneumoniae NOT DETECTED NOT DETECTED Final   Streptococcus pyogenes NOT DETECTED NOT DETECTED Final   A.calcoaceticus-baumannii NOT DETECTED NOT DETECTED Final   Bacteroides fragilis NOT DETECTED NOT DETECTED Final   Enterobacterales NOT DETECTED NOT DETECTED Final   Enterobacter cloacae complex NOT DETECTED NOT DETECTED Final   Escherichia coli NOT DETECTED NOT DETECTED Final   Klebsiella aerogenes NOT DETECTED NOT DETECTED Final   Klebsiella oxytoca NOT DETECTED NOT DETECTED Final   Klebsiella pneumoniae NOT DETECTED NOT DETECTED Final   Proteus species NOT DETECTED NOT DETECTED Final   Salmonella species NOT DETECTED NOT DETECTED Final   Serratia marcescens NOT DETECTED NOT DETECTED Final   Haemophilus influenzae NOT DETECTED NOT DETECTED Final   Neisseria meningitidis NOT DETECTED NOT DETECTED Final   Pseudomonas aeruginosa NOT DETECTED NOT DETECTED Final   Stenotrophomonas maltophilia  NOT DETECTED NOT DETECTED Final   Candida albicans NOT DETECTED NOT DETECTED Final   Candida auris NOT DETECTED NOT DETECTED Final   Candida glabrata NOT DETECTED NOT DETECTED Final   Candida krusei NOT DETECTED NOT DETECTED Final   Candida parapsilosis NOT DETECTED NOT DETECTED Final   Candida tropicalis NOT DETECTED NOT DETECTED Final   Cryptococcus neoformans/gattii NOT DETECTED NOT DETECTED Final   Vancomycin resistance NOT DETECTED NOT DETECTED Final    Comment: Performed  at [facility], [address] [place], [place] [address]  CULTURE, BLOOD (ROUTINE X 2) w Reflex to ID Panel     Status: None (Preliminary result)   Collection Time: 08/12/21 11:58 PM   Specimen: BLOOD  Result Value Ref Range Status   Specimen Description BLOOD BLOOD LEFT HAND  Final   Special Requests   Final    BOTTLES DRAWN AEROBIC AND ANAEROBIC Blood Culture adequate volume   Culture   Final    NO GROWTH 4 DAYS Performed at [facility], [address], [place], [place] [address]    Report Status PENDING  Incomplete  CULTURE, BLOOD (ROUTINE X 2) w Reflex to ID Panel     Status: None (Preliminary result)   Collection Time: 08/12/21 11:58 PM   Specimen: BLOOD  Result Value Ref Range Status   Specimen Description BLOOD LEFT RADIAL  Final   Special Requests   Final    BOTTLES DRAWN AEROBIC AND ANAEROBIC Blood Culture results may not be optimal due to an inadequate volume of blood received in culture bottles   Culture   Final    NO GROWTH 4 DAYS Performed at [facility], [address] [place], [place] [address]    Report Status PENDING  Incomplete    Coagulation Studies: No results for input(s): LABPROT, INR in the last 72 hours.  Urinalysis: No results for input(s): COLORURINE, LABSPEC, PHURINE, GLUCOSEU, HGBUR, BILIRUBINUR, KETONESUR, PROTEINUR, UROBILINOGEN, NITRITE, LEUKOCYTESUR in the last 72 hours.  Invalid input(s): APPERANCEUR    Imaging: ECHO TEE  Result  Date: 08/16/2021    TRANSESOPHOGEAL ECHO REPORT   Patient Name:   [name] [name] Date of Exam: 08/16/2021 Medical Rec #:  [id number]     Height:       73.0 in Accession #:    [id number]    Weight:       200.6 lb Date of Birth:  [birth year]     BSA:          2.154 m Patient Age:    89 years      BP:           134/78 mmHg Patient Gender: M             HR:           99 bpm. Exam Location:  [facility] Procedure: Transesophageal Echo and Color Doppler Indications:     Endocarditis  History:         Patient has prior history of Echocardiogram examinations, most                  recent 08/13/2021. ESRD; Risk Factors:Hypertension and Current                  Smoker. Hx of polysubstance abuse.  Sonographer:     Charmayne Sheer Referring Phys:  [place] Diagnosing Phys: [place]: The transesophogeal probe was passed without difficulty through the esophogus of the patient. Sedation performed by performing physician. The patient developed no complications during the procedure. IMPRESSIONS  1. Left ventricular ejection fraction, by estimation, is 55 to 60%. The left ventricle has normal function. The left ventricular internal cavity size was mildly dilated.  2. Right ventricular systolic function is mildly reduced. The right ventricular size is mildly enlarged.  3. Left atrial size was moderately dilated. No left atrial/left atrial appendage thrombus was detected.  4. Right atrial size was mild to moderately dilated.  5.  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  6. 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  7. 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 The aortic valve is abnormal. Aortic valve regurgitation is severe. Mild to moderate aortic valve sclerosis/calcification is present, without any evidence of aortic stenosis. Conclusion(s)/Recommendation(s): Findings are concerning for vegetation/infective endocarditis as detailed above.  Findings concerning for aortic valve vegetation. FINDINGS  Left Ventricle: Left ventricular ejection fraction, by estimation, is 55 to 60%. The left ventricle has normal function. The left ventricular internal cavity size was mildly dilated. Right Ventricle: The right ventricular size is mildly enlarged. No increase in right ventricular wall thickness. Right ventricular systolic function is mildly reduced. Left Atrium: Left atrial size was moderately dilated. No left atrial/left atrial appendage thrombus was detected. Right Atrium: Right atrial size was mild to moderately dilated. Pericardium: There is no evidence of pericardial effusion. 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 The aortic valve is abnormal. Aortic valve regurgitation is severe. Mild to moderate aortic valve sclerosis/calcification is present, without any evidence of aortic stenosis. Pulmonic Valve: The pulmonic valve was normal in structure. Pulmonic valve regurgitation is not visualized. Aorta: The aortic root was not well visualized. IAS/Shunts: No atrial level shunt detected by color flow Doppler. [place] Electronically signed by Neoma Laming Signature Date/Time: 08/16/2021/9:28:16 AM    Final      Medications:    ampicillin (OMNIPEN) IV 2 g (08/17/21 0539)   cefTRIAXone (ROCEPHIN)  IV 2 g (08/17/21 1029)   sodium chloride     sodium chloride       apixaban  2.5 mg Oral BID   calcium acetate  2,668 mg Oral TID WC   Chlorhexidine Gluconate Cloth  6 each Topical Q0600   epoetin (EPOGEN/PROCRIT) injection  4,000 Units Intravenous Q M,W,F-HD   feeding supplement (NEPRO CARB STEADY)  237 mL Oral TID BM   lidocaine  2 patch Transdermal Q24H   nicotine  21 mg Transdermal Daily   senna-docusate  1 tablet Oral BID    acetaminophen, bisacodyl, hydrALAZINE, LORazepam, Muscle Rub, ondansetron (ZOFRAN) IV, oxyCODONE-acetaminophen, polyethylene glycol  Assessment/ Plan:  [name] is a 60 y.o.  male with medical conditions including anemia, CHF, hypertension, and ESRD on dialysis. Patient has presented to ED with progressive back pain.  [facility] [place]/MWF/Rt AVG  1. End stage renal disease on dialysis Received dialysis yesterday, UF goal 1.5 achieved. Tolerated well Next treatment scheduled for Friday  2. Anemia of chronic kidney disease Lab Results  Component Value Date   HGB 7.8 (L) 08/17/2021  Low dose Epogen  with dialysis.  3. Secondary Hyperparathyroidism:  Lab Results  Component Value Date   PTH 583.2 (H) 12/11/2012   CALCIUM 10.2 08/17/2021   CAION 1.27 (H) 12/26/2012   PHOS 6.2 (H) 08/16/2021  Calcium at target Calcium acetate with meals  4. Sepsis with back pain believed due to enterococcus faecalis bacteremia. Spine MRI show multiple abnormalities noted. L4-L5 discitis and possble psoas phlegmon, C5-C6 marrow edema for possible evolving osteomyelitis. TEE completed today shows vegetation on aortic valve suggestive of endocarditis. ID recommending 6 weeks Ampicillin and Ceftriaxone antibiotics via central line. Antibiotics include ampicillin and ceftriaxone.     LOS: 8   9/8/202211:39 AM

## 2021-08-17 NOTE — Progress Notes (Signed)
OT Cancellation Note  Patient Details Name: [name] [name] MRN: [id number] DOB: 1961/06/03   Cancelled Treatment:    Reason Eval/Treat Not Completed: Pain limiting ability to participate;Medical issues which prohibited therapy. Pt reports 9/10 back pain. Also states he has not had a bowel movement in over a week; has just been given something to assist with that and prefers to remain in bed at present. Requests therapist return another day.   Josiah Lobo, PhD, MS, OTR/L 08/17/21, 3:20 PM

## 2021-08-17 NOTE — Progress Notes (Signed)
PT Cancellation Note  Patient Details Name: [name] [name] MRN: [id number] DOB: [birth year]   Cancelled Treatment:    Reason Eval/Treat Not Completed: Other (comment).  Chart reviewed; pt noted with recent pain meds.  Pt resting in bed upon PT arrival.  Pt reporting using up all his energy last night to get up and declining to get OOB at this time.  Will re-attempt PT session at a later date/time.  Leitha Bleak, PT 08/17/21, 9:40 AM

## 2021-08-17 NOTE — Care Management Important Message (Signed)
Important Message  Patient Details  Name: [name] [name] MRN: [id number] Date of Birth: [birth year]   Medicare Important Message Given:  Yes     Juliann Pulse A Jonni Oelkers 08/17/2021, 3:00 PM

## 2021-08-17 NOTE — Progress Notes (Signed)
Patient ID: [name] [name], male   DOB: November 01, 1961, 60 y.o.   MRN: [id number]  PROGRESS NOTE    [name] [name]  ZYS:[id number] DOB: [birth year] DOA: 08/08/2021 PCP: Pcp, No   Brief Narrative:  60 y.o. male with medical history significant of ESRD-HD, hypertension, tobacco abuse, polysubstance abuse, diverticulitis, anemia, jugular vein thrombosis, GI bleeding, right pneumothorax presented with worsening lower back pain.  He was found to have sepsis secondary to Enterococcus faecalis bacteremia.  ID was consulted and he has been on ampicillin and ceftriaxone.  He was found to have possible developing osteomyelitis in C5-C6 with possible early left psoas abscess formation and possible early discitis in L4-L5.  Nephrology was also consulted for continuation of dialysis.  TEE showed aortic valve endocarditis.  Assessment & Plan:   Sepsis: Possibly evolving on admission Enterococcus faecalis bacteremia Aortic valve endocarditis -ID following.  Currently on Rocephin and ampicillin. -TTE negative for vegetations. 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  ID is recommending 6 to 8 weeks of ampicillin and Rocephin.  Will need either PICC line or tunneled catheter for the same. -Blood cultures from 08/11/2021 grew E faecalis.  Blood cultures from 08/12/2021 have been negative so far.  Severe lower back pain Possible developing osteomyelitis in C5-C6 Left psoas muscle infection with superimposed 1.3 cm phlegmon/early abscess formation Possible early discitis in L4-L5 -Continue antibiotics as above.  Continue pain management and bowel regimen  End-stage renal disease on hemodialysis -Nephrology following.  Dialysis as per nephrology schedule  Anemia of chronic disease -From renal failure.  Hemoglobin stable  Polysubstance abuse and tobacco abuse -Patient was counseled regarding cessation by prior hospitalist.  Continue nicotine patch  Acute embolism and thrombosis of internal jugular  vein -Eliquis has been changed from 5 mg daily to 2.5 mg twice daily as per Dr. Keturah Barre recommendations.  Continue Eliquis  Hypertension Hypotension -Blood pressure currently stable.  Off amlodipine  Chronic combined CHF -LVEF of 30 to 35% as per TTE on 08/13/2021.  Volume managed by dialysis.  Strict input output.  Daily weights.  Fluid restriction  Intermittent delirium -Monitor mental status.  Continue Engineer, materials.  Fall precautions.  Generalized conditioning -PT/OT recommend SNF placement.  Social worker following.  DVT prophylaxis: Eliquis Code Status: Full Family Communication: None at bedside Disposition Plan: Status is: Inpatient  Remains inpatient appropriate because:Inpatient level of care appropriate due to severity of illness  Dispo: The patient is from: Home              Anticipated d/c is to: SNF              Patient currently is not medically stable to d/c.   Difficult to place patient No  Consultants: ID/cardiology  Procedures: TTE/TEE  Antimicrobials:  Anti-infectives (From admission, onward)    Start     Dose/Rate Route Frequency Ordered Stop   08/14/21 1530  cefTRIAXone (ROCEPHIN) 2 g in sodium chloride 0.9 % 100 mL IVPB        2 g 200 mL/hr over 30 Minutes Intravenous Every 12 hours 08/14/21 1443     08/14/21 1200  vancomycin (VANCOCIN) IVPB 1000 mg/200 mL premix  Status:  Discontinued        1,000 mg 200 mL/hr over 60 Minutes Intravenous Every M-W-F (Hemodialysis) 08/11/21 1831 08/12/21 0517   08/12/21 2200  cefTRIAXone (ROCEPHIN) 2 g in sodium chloride 0.9 % 100 mL IVPB  Status:  Discontinued        2 g 200 mL/hr  over 30 Minutes Intravenous Every 12 hours 08/12/21 1923 08/14/21 1443   08/12/21 0615  ampicillin (OMNIPEN) 2 g in sodium chloride 0.9 % 100 mL IVPB        2 g 300 mL/hr over 20 Minutes Intravenous Every 12 hours 08/12/21 0520     08/11/21 1930  vancomycin (VANCOREADY) IVPB 2000 mg/400 mL        2,000 mg 200 mL/hr over 120 Minutes  Intravenous  Once 08/11/21 1831 08/12/21 0156   08/11/21 1900  cefTRIAXone (ROCEPHIN) 2 g in sodium chloride 0.9 % 100 mL IVPB  Status:  Discontinued        2 g 200 mL/hr over 30 Minutes Intravenous Every 12 hours 08/11/21 1829 08/12/21 0517        Subjective: Patient seen and examined at bedside.  He is a poor historian.  No fever, vomiting, worsening shortness of breath reported.  Objective: Vitals:   08/16/21 1746 08/16/21 2112 08/16/21 2330 08/17/21 0600  BP: (!) 108/53 (!) 122/58 (!) 126/57 (!) 124/56  Pulse: 94 99 98 96  Resp: 14 20 17 20   Temp: 98.2 F (36.8 C) 98.8 F (37.1 C) 99 F (37.2 C) 98.6 F (37 C)  TempSrc: Oral     SpO2: 100% 99% 99% (!) 83%  Weight:      Height:        Intake/Output Summary (Last 24 hours) at 08/17/2021 0800 Last data filed at 08/16/2021 1630 Gross per 24 hour  Intake 360 ml  Output 1183 ml  Net -823 ml    Filed Weights   08/08/21 0625 08/16/21 0754  Weight: 91 kg 91 kg    Examination:  General exam: Chronically ill looking and deconditioned.  No distress.  Currently on 2 L oxygen via nasal cannula.   Respiratory system: Decreased breath sounds at bases bilaterally with some crackles  cardiovascular system: Rate controlled's, S1-S2 heard gastrointestinal system: Abdomen is distended slightly, soft and nontender.  Bowel sounds are heard Extremities: Bilateral lower extremity edema present; no clubbing Central nervous system: Sleepy, wakes up only very slightly, extremely slow to respond.  Poor historian.  No focal neurological deficits.  Moves extremities Skin: No obvious ecchymosis/lesions Psychiatry: Hardly participates in any conversation.  Affect is extremely flat  Data Reviewed: I have personally reviewed following labs and imaging studies  CBC: Recent Labs  Lab 08/13/21 0827 08/14/21 0614 08/15/21 0446 08/16/21 0429 08/17/21 0449  WBC 8.8 8.2 8.5 8.6 7.7  HGB 8.4* 8.6* 8.7* 8.8* 7.8*  HCT 26.9* 28.7* 27.3* 28.1*  24.5*  MCV 97.8 96.0 97.2 95.6 96.1  PLT 138* 180 179 211 938    Basic Metabolic Panel: Recent Labs  Lab 08/11/21 0226 08/12/21 0613 08/13/21 0827 08/14/21 0614 08/15/21 0446 08/16/21 0429 08/17/21 0449  NA 136   < > 136 135 136 136 135  K 5.0   < > 4.6 4.8 4.0 4.2 3.9  CL 99   < > 97* 96* 96* 96* 96*  CO2 28   < > 29 28 29 28 30   GLUCOSE 89   < > 90 111* 102* 96 95  BUN 65*   < > 55* 65* 46* 58* 38*  CREATININE 9.61*   < > 7.92* 9.28* 7.01* 8.68* 7.01*  CALCIUM 9.9   < > 10.0 10.0 10.1 10.9* 10.2  MG 2.2   < > 1.9 2.0 1.9 1.9 1.9  PHOS 7.6*  --   --   --   --  6.2*  --    < > =  values in this interval not displayed.    GFR: Estimated Creatinine Clearance: 13.1 mL/min (A) (by C-G formula based on SCr of 7.01 mg/dL (H)). Liver Function Tests: No results for input(s): AST, ALT, ALKPHOS, BILITOT, PROT, ALBUMIN in the last 168 hours. No results for input(s): LIPASE, AMYLASE in the last 168 hours. No results for input(s): AMMONIA in the last 168 hours. Coagulation Profile: No results for input(s): INR, PROTIME in the last 168 hours. Cardiac Enzymes: No results for input(s): CKTOTAL, CKMB, CKMBINDEX, TROPONINI in the last 168 hours. BNP (last 3 results) No results for input(s): PROBNP in the last 8760 hours. HbA1C: No results for input(s): HGBA1C in the last 72 hours. CBG: Recent Labs  Lab 08/14/21 0742 08/14/21 1639  GLUCAP 93 346*    Lipid Profile: No results for input(s): CHOL, HDL, LDLCALC, TRIG, CHOLHDL, LDLDIRECT in the last 72 hours. Thyroid Function Tests: No results for input(s): TSH, T4TOTAL, FREET4, T3FREE, THYROIDAB in the last 72 hours. Anemia Panel: No results for input(s): VITAMINB12, FOLATE, FERRITIN, TIBC, IRON, RETICCTPCT in the last 72 hours. Sepsis Labs: Recent Labs  Lab 08/11/21 0226 08/11/21 1747 08/11/21 2002 08/12/21 0613 08/13/21 0827  PROCALCITON 5.82  --   --  5.15 4.61  LATICACIDVEN  --  0.9 1.1  --   --      Recent Results  (from the past 240 hour(s))  Resp Panel by RT-PCR (Flu A&B, Covid) Nasopharyngeal Swab     Status: None   Collection Time: 08/08/21  1:22 PM   Specimen: Nasopharyngeal Swab; Nasopharyngeal(NP) swabs in vial transport medium  Result Value Ref Range Status   SARS Coronavirus 2 by RT PCR NEGATIVE NEGATIVE Final    Comment: (NOTE) SARS-CoV-2 target nucleic acids are NOT DETECTED.  The SARS-CoV-2 RNA is generally detectable in upper respiratory specimens during the acute phase of infection. The lowest concentration of SARS-CoV-2 viral copies this assay can detect is 138 copies/mL. A negative result does not preclude SARS-Cov-2 infection and should not be used as the sole basis for treatment or other patient management decisions. A negative result may occur with  improper specimen collection/handling, submission of specimen other than nasopharyngeal swab, presence of viral mutation(s) within the areas targeted by this assay, and inadequate number of viral copies(<138 copies/mL). A negative result must be combined with clinical observations, patient history, and epidemiological information. The expected result is Negative.  Fact Sheet for Patients:  EntrepreneurPulse.com.au  Fact Sheet for Healthcare Providers:  IncredibleEmployment.be  This test is no t yet approved or cleared by the [place] FDA and  has been authorized for detection and/or diagnosis of SARS-CoV-2 by FDA under an Emergency Use Authorization (EUA). This EUA will remain  in effect (meaning this test can be used) for the duration of the COVID-19 declaration under Section 564(b)(1) of the Act, 21 U.S.C.section 360bbb-3(b)(1), unless the authorization is terminated  or revoked sooner.       Influenza A by PCR NEGATIVE NEGATIVE Final   Influenza B by PCR NEGATIVE NEGATIVE Final    Comment: (NOTE) The Xpert Xpress SARS-CoV-2/FLU/RSV plus assay is intended as an aid in the  diagnosis of influenza from Nasopharyngeal swab specimens and should not be used as a sole basis for treatment. Nasal washings and aspirates are unacceptable for Xpert Xpress SARS-CoV-2/FLU/RSV testing.  Fact Sheet for Patients: EntrepreneurPulse.com.au  Fact Sheet for Healthcare Providers: IncredibleEmployment.be  This test is not yet approved or cleared by the [place] FDA and has been authorized for detection  and/or diagnosis of SARS-CoV-2 by FDA under an Emergency Use Authorization (EUA). This EUA will remain in effect (meaning this test can be used) for the duration of the COVID-19 declaration under Section 564(b)(1) of the Act, 21 U.S.C. section 360bbb-3(b)(1), unless the authorization is terminated or revoked.  Performed at [facility], [address] [place], [place] [address]   Culture, blood (Routine X 2) w Reflex to ID Panel     Status: Abnormal   Collection Time: 08/11/21  5:47 PM   Specimen: BLOOD  Result Value Ref Range Status   Specimen Description   Final    BLOOD LEFT HAND Performed at [facility], [address] [place] [address], [place], [place] [address]    Special Requests   Final    BOTTLES DRAWN AEROBIC AND ANAEROBIC Blood Culture adequate volume Performed at [facility], [address] [place], [place] [address]    Culture  Setup Time   Final    GRAM POSITIVE COCCI IN CHAINS IN BOTH AEROBIC AND ANAEROBIC BOTTLES Gram Stain Report Called to,Read Back By and Verified With: JASON ROBBINS 08/12/21 @ 0507 BY SB Performed at [facility], [address] [place], [place] [address]    Culture (A)  Final    ENTEROCOCCUS FAECALIS SUSCEPTIBILITIES PERFORMED ON PREVIOUS CULTURE WITHIN THE LAST 5 DAYS. Performed at [facility], [place] [address], [place], [place] [address]    Report Status 08/14/2021 FINAL  Final  Culture, blood (Routine X 2) w Reflex to ID Panel     Status: Abnormal    Collection Time: 08/11/21  5:47 PM   Specimen: BLOOD  Result Value Ref Range Status   Specimen Description   Final    BLOOD FOA Performed at [facility], [address], [place], [place] [address]    Special Requests   Final    BOTTLES DRAWN AEROBIC AND ANAEROBIC Blood Culture adequate volume Performed at [facility] [place], [address] [place], [place] [address]    Culture  Setup Time   Final    GRAM POSITIVE COCCI IN CHAINS IN BOTH AEROBIC AND ANAEROBIC BOTTLES Gram Stain Report Called to,Read Back By and Verified With: JASON ROBBINS 08/12/21 @ 0507 BY SB Performed at [facility], [place] [address] [place], [place] [address]    Culture ENTEROCOCCUS FAECALIS (A)  Final   Report Status 08/14/2021 FINAL  Final   Organism ID, Bacteria ENTEROCOCCUS FAECALIS  Final      Susceptibility   Enterococcus faecalis - MIC*    AMPICILLIN <=2 SENSITIVE Sensitive     VANCOMYCIN 2 SENSITIVE Sensitive     GENTAMICIN SYNERGY SENSITIVE Sensitive     * ENTEROCOCCUS FAECALIS  Blood Culture ID Panel (Reflexed)     Status: Abnormal   Collection Time: 08/11/21  5:47 PM  Result Value Ref Range Status   Enterococcus faecalis DETECTED (A) NOT DETECTED Final    Comment: CRITICAL RESULT CALLED TO, READ BACK BY AND VERIFIED WITH: JASON ROBBINS 08/12/21 @ 0507 BY SB    Enterococcus Faecium NOT DETECTED NOT DETECTED Final   Listeria monocytogenes NOT DETECTED NOT DETECTED Final   Staphylococcus species NOT DETECTED NOT DETECTED Final   Staphylococcus aureus (BCID) NOT DETECTED NOT DETECTED Final   Staphylococcus epidermidis NOT DETECTED NOT DETECTED Final   Staphylococcus lugdunensis NOT DETECTED NOT DETECTED Final   Streptococcus species NOT DETECTED NOT DETECTED Final   Streptococcus agalactiae NOT DETECTED NOT DETECTED Final   Streptococcus pneumoniae NOT  DETECTED NOT DETECTED Final   Streptococcus pyogenes NOT DETECTED NOT DETECTED Final   A.calcoaceticus-baumannii NOT DETECTED NOT  DETECTED Final   Bacteroides fragilis NOT DETECTED NOT DETECTED Final   Enterobacterales NOT DETECTED NOT DETECTED Final   Enterobacter cloacae complex NOT DETECTED NOT DETECTED Final   Escherichia coli NOT DETECTED NOT DETECTED Final   Klebsiella aerogenes NOT DETECTED NOT DETECTED Final   Klebsiella oxytoca NOT DETECTED NOT DETECTED Final   Klebsiella pneumoniae NOT DETECTED NOT DETECTED Final   Proteus species NOT DETECTED NOT DETECTED Final   Salmonella species NOT DETECTED NOT DETECTED Final   Serratia marcescens NOT DETECTED NOT DETECTED Final   Haemophilus influenzae NOT DETECTED NOT DETECTED Final   Neisseria meningitidis NOT DETECTED NOT DETECTED Final   Pseudomonas aeruginosa NOT DETECTED NOT DETECTED Final   Stenotrophomonas maltophilia NOT DETECTED NOT DETECTED Final   Candida albicans NOT DETECTED NOT DETECTED Final   Candida auris NOT DETECTED NOT DETECTED Final   Candida glabrata NOT DETECTED NOT DETECTED Final   Candida krusei NOT DETECTED NOT DETECTED Final   Candida parapsilosis NOT DETECTED NOT DETECTED Final   Candida tropicalis NOT DETECTED NOT DETECTED Final   Cryptococcus neoformans/gattii NOT DETECTED NOT DETECTED Final   Vancomycin resistance NOT DETECTED NOT DETECTED Final    Comment: Performed at [facility], [address] [place], [place] [address]  CULTURE, BLOOD (ROUTINE X 2) w Reflex to ID Panel     Status: None (Preliminary result)   Collection Time: 08/12/21 11:58 PM   Specimen: BLOOD  Result Value Ref Range Status   Specimen Description BLOOD BLOOD LEFT HAND  Final   Special Requests   Final    BOTTLES DRAWN AEROBIC AND ANAEROBIC Blood Culture adequate volume   Culture   Final    NO GROWTH 4 DAYS Performed at [facility], [address] [place], [place] [address]    Report Status PENDING  Incomplete  CULTURE, BLOOD (ROUTINE X 2) w Reflex to ID Panel     Status: None (Preliminary result)   Collection Time: 08/12/21 11:58  PM   Specimen: BLOOD  Result Value Ref Range Status   Specimen Description BLOOD LEFT RADIAL  Final   Special Requests   Final    BOTTLES DRAWN AEROBIC AND ANAEROBIC Blood Culture results may not be optimal due to an inadequate volume of blood received in culture bottles   Culture   Final    NO GROWTH 4 DAYS Performed at [facility], [address] [place], [place] [address]    Report Status PENDING  Incomplete          Radiology Studies: ECHO TEE  Result Date: 08/16/2021    TRANSESOPHOGEAL ECHO REPORT   Patient Name:   JEMUEL LAURSEN Date of Exam: 08/16/2021 Medical Rec #:  [id number]     Height:       73.0 in Accession #:    [id number]    Weight:       200.6 lb Date of Birth:  March 20, 1961     BSA:          2.154 m Patient Age:    78 years      BP:           134/78 mmHg Patient Gender: M             HR:           99 bpm. Exam Location:  [facility] Procedure: Transesophageal Echo and  Color Doppler Indications:     Endocarditis  History:         Patient has prior history of Echocardiogram examinations, most                  recent 08/13/2021. ESRD; Risk Factors:Hypertension and Current                  Smoker. Hx of polysubstance abuse.  Sonographer:     Charmayne Sheer Referring Phys:  [place] Diagnosing Phys: [place]: The transesophogeal probe was passed without difficulty through the esophogus of the patient. Sedation performed by performing physician. The patient developed no complications during the procedure. IMPRESSIONS  1. Left ventricular ejection fraction, by estimation, is 55 to 60%. The left ventricle has normal function. The left ventricular internal cavity size was mildly dilated.  2. Right ventricular systolic function is mildly reduced. The right ventricular size is mildly enlarged.  3. Left atrial size was moderately dilated. No left atrial/left atrial appendage thrombus was detected.  4. Right atrial size was mild to moderately dilated.  5. 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  6. 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  7. 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 The aortic valve is abnormal. Aortic valve regurgitation is severe. Mild to moderate aortic valve sclerosis/calcification is present, without any evidence of aortic stenosis. Conclusion(s)/Recommendation(s): Findings are concerning for vegetation/infective endocarditis as detailed above. Findings concerning for aortic valve vegetation. FINDINGS  Left Ventricle: Left ventricular ejection fraction, by estimation, is 55 to 60%. The left ventricle has normal function. The left ventricular internal cavity size was mildly dilated. Right Ventricle: The right ventricular size is mildly enlarged. No increase in right ventricular wall thickness. Right ventricular systolic function is mildly reduced. Left Atrium: Left atrial size was moderately dilated. No left atrial/left atrial appendage thrombus was detected. Right Atrium: Right atrial size was mild to moderately dilated. Pericardium: There is no evidence of pericardial effusion. 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 The aortic valve is abnormal. Aortic valve regurgitation is severe. Mild to moderate aortic valve sclerosis/calcification is present, without any evidence of aortic stenosis. Pulmonic Valve: The pulmonic valve was normal in structure. Pulmonic valve regurgitation is not visualized. Aorta: The aortic root was not well visualized. IAS/Shunts: No atrial level shunt detected by color flow Doppler. [place] Electronically signed by Neoma Laming Signature Date/Time: 08/16/2021/9:28:16 AM    Final         Scheduled Meds:  apixaban  2.5 mg Oral BID    calcium acetate  2,668 mg Oral TID WC   Chlorhexidine Gluconate Cloth  6 each Topical Q0600   epoetin (EPOGEN/PROCRIT) injection  4,000 Units Intravenous Q M,W,F-HD   feeding supplement (NEPRO CARB STEADY)  237 mL Oral TID BM   lidocaine  2 patch Transdermal Q24H   nicotine  21 mg Transdermal Daily   senna-docusate  1 tablet Oral BID   Continuous Infusions:  ampicillin (OMNIPEN) IV 2 g (08/17/21 0539)   cefTRIAXone (ROCEPHIN)  IV 2 g (08/16/21 2217)   sodium chloride     sodium chloride            Aline August, MD [facility] 08/17/2021, 8:00 AM

## 2021-08-17 NOTE — Progress Notes (Signed)
IR aware of request for tunneled IJ central catheter placement for long term antibiotic therapy. Will put on the schedule in IR for tomorrow 9/9  Hedy Jacob, PA-C 08/17/2021, 4:01 PM

## 2021-08-17 NOTE — Progress Notes (Signed)
PHARMACY CONSULT NOTE FOR:  OUTPATIENT  PARENTERAL ANTIBIOTIC THERAPY (OPAT)  Indication: Enterococcus faecalis Bacteremia, lumbar discitis and endocarditis with ESRD  Regimen: Ampicillin 2gm IV q 12 hour       Ceftraixone 2gm IV q12 hours  End date: October/15/2022  IV antibiotic discharge orders are pended. To discharging provider:  please sign these orders via discharge navigator,  Select New Orders & click on the button choice - Manage This Unsigned Work.     Thank you for allowing pharmacy to be a part of this patient's care.  Nabil Bubolz Rodriguez-Guzman PharmD, BCPS 08/17/2021 1:30 PM

## 2021-08-17 NOTE — TOC Progression Note (Addendum)
Transition of Care [facility]) - Progression Note    Patient Details  Name: [name] [name] MRN: [id number] Date of Birth: Jun 21, 1961  Transition of Care [facility]) CM/SW [place], RN Phone Number: 08/17/2021, 10:17 AM  Clinical Narrative:     Spoke with the patient at the bedside to discuss DC plan and review the bed offers and dialysis needs, he stated that he does not want to go to [facility], I spoke with Estill Bamberg with patient pathways and she will work with [place] to get his dialysis center set up once the patient decides which facility to go with. I contacted [place] to inquire which dialysis center that they transport patients to.I am awaiting a call back The patient worked with Estill Bamberg the patient Pharmacist, hospital, he chose to go to [facility] and Estill Bamberg is getting his dialysis transferred,  He will sit up tomorrow for Dialysis,  I notified Claiborne Billings at [facility] about the acceptance Will continue to follow thru DC,       Expected Discharge Plan and Services                                                 Social Determinants of Health ([place]) Interventions    Readmission Risk Interventions No flowsheet data found.

## 2021-08-17 NOTE — Progress Notes (Signed)
ID Family at bedside Pt gave permission to talk in front of his family Pt says he is  constipated for the past 10 days. His back persist He has no fever He is aware of his diagnosis Vital Patient Vitals for the past 24 hrs:  BP Temp Temp src Pulse Resp SpO2  08/17/21 0826 (!) 108/50 98.5 F (36.9 C) -- 89 17 100 %  08/17/21 0600 (!) 124/56 98.6 F (37 C) -- 96 20 (!) 83 %  08/16/21 2330 (!) 126/57 99 F (37.2 C) -- 98 17 99 %  08/16/21 2112 (!) 122/58 98.8 F (37.1 C) -- 99 20 99 %  08/16/21 1746 (!) 108/53 98.2 F (36.8 C) Oral 94 14 100 %  08/16/21 1630 111/65 -- -- 95 19 --  08/16/21 1615 123/64 -- -- -- -- --    Awake and alert No distress Some tremors hands Chest b/l air entry [id number] Abd soft Rt AVF Cns not examined in detail because of low back painl  Labs CBC Latest Ref Rng & Units 08/17/2021 08/16/2021 08/15/2021  WBC 4.0 - 10.5 K/uL 7.7 8.6 8.5  Hemoglobin 13.0 - 17.0 g/dL 7.8(L) 8.8(L) 8.7(L)  Hematocrit 39.0 - 52.0 % 24.5(L) 28.1(L) 27.3(L)  Platelets 150 - 400 K/uL 195 211 179     CMP Latest Ref Rng & Units 08/17/2021 08/16/2021 08/15/2021  Glucose 70 - 99 mg/dL 95 96 102(H)  BUN 6 - 20 mg/dL 38(H) 58(H) 46(H)  Creatinine 0.61 - 1.24 mg/dL 7.01(H) 8.68(H) 7.01(H)  Sodium 135 - 145 mmol/L 135 136 136  Potassium 3.5 - 5.1 mmol/L 3.9 4.2 4.0  Chloride 98 - 111 mmol/L 96(L) 96(L) 96(L)  CO2 22 - 32 mmol/L 30 28 29   Calcium 8.9 - 10.3 mg/dL 10.2 10.9(H) 10.1  Total Protein 6.5 - 8.1 g/dL - - -  Total Bilirubin 0.3 - 1.2 mg/dL - - -  Alkaline Phos 38 - 126 U/L - - -  AST 15 - 41 U/L - - -  ALT 0 - 44 U/L - - -     Micro 08/11/21 - BC enterococcus 08/12/21 BC- NG  Impression/recommendation  Enterococcus bacteremia L4-L5 discitis and psoas phlegmon  C5-C6 marrow edema for possible evolving osteo  Aortic valve endocarditis  Pt on ampicillin + ceftriaxone = will need a total of 6 weeks and Po amoxicillin after that for a few weeks OPAT orders placed Follow up  appt with me given  Discussed the management I detail with patient and his family member .

## 2021-08-18 ENCOUNTER — Inpatient Hospital Stay: Payer: Medicare Other | Admitting: Radiology

## 2021-08-18 DIAGNOSIS — M5440 Lumbago with sciatica, unspecified side: Secondary | ICD-10-CM | POA: Diagnosis not present

## 2021-08-18 DIAGNOSIS — I82C19 Acute embolism and thrombosis of unspecified internal jugular vein: Secondary | ICD-10-CM | POA: Diagnosis not present

## 2021-08-18 DIAGNOSIS — I330 Acute and subacute infective endocarditis: Secondary | ICD-10-CM | POA: Diagnosis not present

## 2021-08-18 DIAGNOSIS — R7881 Bacteremia: Secondary | ICD-10-CM | POA: Diagnosis not present

## 2021-08-18 DIAGNOSIS — N186 End stage renal disease: Secondary | ICD-10-CM | POA: Diagnosis not present

## 2021-08-18 DIAGNOSIS — M4646 Discitis, unspecified, lumbar region: Secondary | ICD-10-CM | POA: Diagnosis not present

## 2021-08-18 DIAGNOSIS — B952 Enterococcus as the cause of diseases classified elsewhere: Secondary | ICD-10-CM | POA: Diagnosis not present

## 2021-08-18 HISTORY — PX: IR FLUORO GUIDE CV LINE LEFT: IMG2282

## 2021-08-18 HISTORY — PX: IR US GUIDANCE: IMG2393

## 2021-08-18 LAB — CULTURE, BLOOD (ROUTINE X 2)
Culture: NO GROWTH
Culture: NO GROWTH
Special Requests: ADEQUATE

## 2021-08-18 LAB — CBC
HCT: 26.2 % — ABNORMAL LOW (ref 39.0–52.0)
Hemoglobin: 8 g/dL — ABNORMAL LOW (ref 13.0–17.0)
MCH: 28.9 pg (ref 26.0–34.0)
MCHC: 30.5 g/dL (ref 30.0–36.0)
MCV: 94.6 fL (ref 80.0–100.0)
Platelets: 215 10*3/uL (ref 150–400)
RBC: 2.77 MIL/uL — ABNORMAL LOW (ref 4.22–5.81)
RDW: 17.4 % — ABNORMAL HIGH (ref 11.5–15.5)
WBC: 9 10*3/uL (ref 4.0–10.5)
nRBC: 0 % (ref 0.0–0.2)

## 2021-08-18 LAB — BASIC METABOLIC PANEL
Anion gap: 14 (ref 5–15)
BUN: 50 mg/dL — ABNORMAL HIGH (ref 6–20)
CO2: 27 mmol/L (ref 22–32)
Calcium: 11.1 mg/dL — ABNORMAL HIGH (ref 8.9–10.3)
Chloride: 95 mmol/L — ABNORMAL LOW (ref 98–111)
Creatinine, Ser: 8.54 mg/dL — ABNORMAL HIGH (ref 0.61–1.24)
GFR, Estimated: 7 mL/min — ABNORMAL LOW (ref >60)
Glucose, Bld: 86 mg/dL (ref 70–99)
Potassium: 4.7 mmol/L (ref 3.5–5.1)
Sodium: 136 mmol/L (ref 135–145)

## 2021-08-18 LAB — MAGNESIUM: Magnesium: 2.1 mg/dL (ref 1.7–2.4)

## 2021-08-18 MED ORDER — FENTANYL CITRATE (PF) 100 MCG/2ML IJ SOLN
INTRAMUSCULAR | Status: DC | PRN
  Administered 2021-08-18: 25 ug via INTRAVENOUS

## 2021-08-18 MED ORDER — HEPARIN SOD (PORK) LOCK FLUSH 100 UNIT/ML IV SOLN
INTRAVENOUS | Status: DC | PRN
  Administered 2021-08-18: 500 [IU] via INTRAVENOUS

## 2021-08-18 MED ORDER — LIDOCAINE-EPINEPHRINE 1 %-1:100000 IJ SOLN
INTRAMUSCULAR | Status: DC | PRN
  Administered 2021-08-18: 20 mL via INTRADERMAL

## 2021-08-18 MED ORDER — FENTANYL CITRATE (PF) 100 MCG/2ML IJ SOLN
INTRAMUSCULAR | Status: AC
  Filled 2021-08-18: qty 2

## 2021-08-18 MED ORDER — MIDAZOLAM HCL 2 MG/2ML IJ SOLN
INTRAMUSCULAR | Status: AC
  Filled 2021-08-18: qty 2

## 2021-08-18 NOTE — TOC Progression Note (Signed)
Transition of Care [facility]) - Progression Note    Patient Details  Name: DESHAY BLUMENFELD MRN: [id number] Date of Birth: [birth year]  Transition of Care [facility]) CM/SW Contact  Su Hilt, RN Phone Number: 08/18/2021, 10:27 AM  Clinical Narrative:      Damaris Schooner with Juliann Pulse at [facility], She was stating that they do not set up the transportation for dialysis, they need the patient to work with PT, They will also need his chair time. Estill Bamberg the [facility] nurse is working on getting a chair time set up, he will need a covid test, He refuses the covid vaccine      Expected Discharge Plan and Services                                                 Social Determinants of Health (SDOH) Interventions    Readmission Risk Interventions No flowsheet data found.

## 2021-08-18 NOTE — Progress Notes (Signed)
Physical Therapy Treatment Patient Details Name: [name] [name] MRN: [id number] DOB: 10-Apr-1961 Today's Date: 08/18/2021    History of Present Illness Pt is a 60 y.o. male with medical history significant of ESRD-HD (MWF), hypertension, tobacco abuse, polysubstance abuse, diverticulitis, anemia, jugular vein thrombosis, GI bleeding, right pneumothorax, who presents with lower back pain. 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    PT Comments    Pt unwilling to attempt OOB mobility this session. He is educated on techniques for bed mobility in order to optimize independence with positioning while in bed. The pt presents with difficulty with rolling over his shoulder in order to achieve sidelying positioning. He is educated on the use of pillows in order to optimize spinal alignment and for pressure relief. PT will continue to follow.    Follow Up Recommendations  SNF     Equipment Recommendations       Recommendations for Other Services       Precautions / Restrictions Precautions Precautions: Fall    Mobility  Bed Mobility Overal bed mobility: Needs Assistance Bed Mobility: Rolling Rolling: Mod assist         General bed mobility comments: Pt is able to pull self to [facility] with set-up assistance and bed in trendelenburg position with use of bed railings. Pt is able to roll hips from supine to sidelying, however presents with deficits with rolling onto L shoulder for full sidelying positioning, even with use of bed railings.    Transfers                 General transfer comment: Pt declining anything but repositioning this session.  Ambulation/Gait                 Stairs             Wheelchair Mobility    Modified Rankin (Stroke Patients Only)       Balance                                             Cognition Arousal/Alertness: Awake/alert Behavior During Therapy: WFL for tasks assessed/performed Overall Cognitive Status: Within Functional Limits for tasks assessed                                        Exercises Other Exercises Other Exercises: Pt educated on positioning with pillows between knees and ankles for pressure relief and also for better spinal alignment as he reports significant back pain. Pt refusing use of pillow behind his back in order to keep him positioning in sidelying.    General Comments        Pertinent Vitals/Pain Pain Score: 6  Pain Descriptors / Indicators: Sore Pain Intervention(s): Repositioned    Home Living                      Prior Function            PT Goals (current goals can now be found in the care plan section) Acute Rehab PT Goals Patient Stated Goal: to have less pain PT Goal Formulation: With patient Time For Goal Achievement: 08/22/21 Potential  to Achieve Goals: Good Progress towards PT goals: Not progressing toward goals - comment    Frequency    Min 2X/week      PT Plan Current plan remains appropriate    Co-evaluation              AM-PAC PT "6 Clicks" Mobility   Outcome Measure  Help needed turning from your back to your side while in a flat bed without using bedrails?: A Lot Help needed moving from lying on your back to sitting on the side of a flat bed without using bedrails?: A Lot Help needed moving to and from a bed to a chair (including a wheelchair)?: A Lot Help needed standing up from a chair using your arms (e.g., wheelchair or bedside chair)?: A Lot Help needed to walk in hospital room?: Total Help needed climbing 3-5 steps with a railing? : Total 6 Click Score: 10    End of Session   Activity Tolerance: Patient limited by pain Patient left: in bed;with call bell/phone within reach;with bed alarm set;with family/visitor present Nurse  Communication: Mobility status PT Visit Diagnosis: Unsteadiness on feet (R26.81);Difficulty in walking, not elsewhere classified (R26.2);Muscle weakness (generalized) (M62.81);Pain Pain - part of body:  (LBP)     Time: 3428-7681 PT Time Calculation (min) (ACUTE ONLY): 11 min  Charges:  $Therapeutic Activity: 8-22 mins                     9:00 AM, 08/18/21 Charistopher Rumble A. Saverio Danker PT, DPT Physical Therapist - [facility] A Dawna Jakes 08/18/2021, 8:58 AM

## 2021-08-18 NOTE — Progress Notes (Signed)
   Date of Admission:  08/08/2021    ID: [name] [name] is a 60 y.o. male Principal Problem:   Lower back pain Active Problems:   End-stage renal disease on hemodialysis (HCC)   Polysubstance abuse ([place])   Tobacco abuse   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 (HCC)   Anemia in ESRD (end-stage renal disease) (HCC)   HTN (hypertension)   Back pain    Subjective: Pt got tunneled cath for IV antibiotic today Had a bowel movt today after 9 days Pain lumbar region still present but slightly better Medications:   apixaban  2.5 mg Oral BID   calcium acetate  2,668 mg Oral TID WC   Chlorhexidine Gluconate Cloth  6 each Topical Q0600   epoetin (EPOGEN/PROCRIT) injection  4,000 Units Intravenous Q M,W,F-HD   feeding supplement (NEPRO CARB STEADY)  237 mL Oral TID BM   lidocaine  2 patch Transdermal Q24H   nicotine  21 mg Transdermal Daily   senna-docusate  1 tablet Oral BID    Objective: Vital signs in last 24 hours: Temp:  [98 F (36.7 C)-98.9 F (37.2 C)] 98.3 F (36.8 C) (09/09 1139) Pulse Rate:  [82-105] 92 (09/09 1139) Resp:  [16-18] 16 (09/09 1139) BP: (110-134)/(51-71) 123/66 (09/09 1139) SpO2:  [91 %-100 %] 100 % (09/09 1139)  Awake , lying in bed Chest b/l air entry Rt AVF Hss1s2 Abd soft CNS non focal Tunneled cath left side IJ . Lab Results Recent Labs    08/17/21 0449 08/18/21 0416  WBC 7.7 9.0  HGB 7.8* 8.0*  HCT 24.5* 26.2*  NA 135 136  K 3.9 4.7  CL 96* 95*  CO2 30 27  BUN 38* 50*  CREATININE 7.01* 8.54*   Liver Panel No results for input(s): PROT, ALBUMIN, AST, ALT, ALKPHOS, BILITOT, BILIDIR, IBILI in the last 72 hours. Sedimentation Rate No results for input(s): ESRSEDRATE in the last 72 hours. C-Reactive Protein Recent Labs    08/17/21 0449  CRP 15.3*    Microbiology: Micro 08/11/21 - BC enterococcus 08/12/21 BC- NG    Assessment/Plan: Enterococcus bacteremia L4-L5 discitis and psoas phlegmon   C5-C6 marrow edema  for possible evolving osteo   Aortic valve endocarditis   Pt on ampicillin + ceftriaxone = will need a total of 6 weeks until 09/23/21 and Po amoxicillin after that for a few weeks OPAT orders placed Follow up appt with me on 09/21/21    OPAT Orders Discharge antibiotics: Ampicillin 2 grams IV every 12 hours Ceftriaxone 2 grams IV Q 12 hours Duration: Total of 6 weeks End Date: 09/23/21   Central line Care Per Protocol:   Labs weekly while on IV antibiotics: _X_ CBC with differential   __X CMP   Line will have to be removed at the end of antibiotic treatment. Need to make appt with IR   Fax weekly labs to  Dr.Tayler Lassen [phone]   Clinic Follow Up Appt: 09/21/21     Call [phone] for any questions    Pt waiting to go to SNF   ID will sign off- call if needed

## 2021-08-18 NOTE — Procedures (Signed)
Vascular and Interventional Radiology Procedure Note  Patient: [name] [name] DOB: 04/06/1961 Medical Record Number: [id number] Note Date/Time: 08/18/21 4:35 PM   Performing Physician: Michaelle Birks, MD Assistant(s): None  Diagnosis:  Long term IV Abx  Procedure: TUNNELED CENTRAL VENOUS CATHETER PLACEMENT  Anesthesia: Conscious Sedation Complications: None Estimated Blood Loss: Minimal Specimens:  None  Findings:  Successful placement of left-sided, SL Powerline catheter with tip at the SVC / Azygous junction.  Pt with catheter-related central stenosis and dilated azygous vein. Catheter tip may migrate into azygous vein on mobilization..  Plan: Catheter ready for use.  See detailed procedure note with images in PACS. The patient tolerated the procedure well without incident or complication and was returned to Floor Bed in stable condition.    Michaelle Birks, MD [facility] Interventional Radiology [facility]   Pager. [place]

## 2021-08-18 NOTE — TOC Progression Note (Signed)
Transition of Care [facility]) - Progression Note    Patient Details  Name: MARKEEM NOREEN MRN: [id number] Date of Birth: Jun 08, 1961  Transition of Care [facility]) CM/SW Contact  Su Hilt, RN Phone Number: 08/18/2021, 3:01 PM  Clinical Narrative:    [place] will accept the patient on Monday after he gets out of dialysis        Expected Discharge Plan and Services                                                 Social Determinants of Health (SDOH) Interventions    Readmission Risk Interventions No flowsheet data found.

## 2021-08-18 NOTE — Progress Notes (Signed)
[facility]  ROUNDING NOTE   Subjective:   [name] [name] is a 60 y.o. male with medical conditions including anemia, CHF, hypertension, and ESRD on dialysis. Patient has presented to ED with progressive back pain.   Patient states he has always had back pain but it has increased in the past few days. Denies known precipitating factors. Chest X-ray of lumbar spine shows multilevel degenerative changes and stenosis, greatest at L4-L5.   Patient is known to our practice and receives dialysis at [facility] supervised by Dr Juleen China. We have been consulted to manage dialysis treatments.   Patient seen laying in bed Alert, mild confusion Currently NPO for procedure   Objective:  Vital signs in last 24 hours:  Temp:  [98 F (36.7 C)-98.9 F (37.2 C)] 98.3 F (36.8 C) (09/09 1139) Pulse Rate:  [82-105] 92 (09/09 1139) Resp:  [16-18] 16 (09/09 1139) BP: (110-134)/(51-71) 123/66 (09/09 1139) SpO2:  [91 %-100 %] 100 % (09/09 1139)  Weight change:  Filed Weights   08/08/21 0625 08/16/21 0754  Weight: 91 kg 91 kg    Intake/Output: I/O last 3 completed shifts: In: 240 [P.O.:240] Out: -    Intake/Output this shift:  No intake/output data recorded.  Physical Exam: General: NAD, resting in bed  Head: Normocephalic, atraumatic. Moist oral mucosal membranes  Eyes: Anicteric  Lungs:  Clear to auscultation, normal effort  Heart: Regular rate and rhythm  Abdomen:  Soft, nontender, distended  Extremities: Mild right upper extremity swelling, no lower extremity edema  Neurologic: Alert, moving all four extremities  Skin: No lesions  Access: Rt AVG, swelling noted in right upper extremity    Basic Metabolic Panel: Recent Labs  Lab 08/14/21 0614 08/15/21 0446 08/16/21 0429 08/17/21 0449 08/18/21 0416  NA 135 136 136 135 136  K 4.8 4.0 4.2 3.9 4.7  CL 96* 96* 96* 96* 95*  CO2 28 29 28 30 27   GLUCOSE 111* 102* 96 95 86  BUN 65* 46* 58* 38* 50*  CREATININE  9.28* 7.01* 8.68* 7.01* 8.54*  CALCIUM 10.0 10.1 10.9* 10.2 11.1*  MG 2.0 1.9 1.9 1.9 2.1  PHOS  --   --  6.2*  --   --      Liver Function Tests: No results for input(s): AST, ALT, ALKPHOS, BILITOT, PROT, ALBUMIN in the last 168 hours.  No results for input(s): LIPASE, AMYLASE in the last 168 hours. No results for input(s): AMMONIA in the last 168 hours.  CBC: Recent Labs  Lab 08/14/21 0614 08/15/21 0446 08/16/21 0429 08/17/21 0449 08/18/21 0416  WBC 8.2 8.5 8.6 7.7 9.0  HGB 8.6* 8.7* 8.8* 7.8* 8.0*  HCT 28.7* 27.3* 28.1* 24.5* 26.2*  MCV 96.0 97.2 95.6 96.1 94.6  PLT 180 179 211 195 215     Cardiac Enzymes: No results for input(s): CKTOTAL, CKMB, CKMBINDEX, TROPONINI in the last 168 hours.  BNP: Invalid input(s): POCBNP  CBG: Recent Labs  Lab 08/14/21 0742 08/14/21 1639  GLUCAP 93 346*     Microbiology: Results for orders placed or performed during the hospital encounter of 08/08/21  Resp Panel by RT-PCR (Flu A&B, Covid) Nasopharyngeal Swab     Status: None   Collection Time: 08/08/21  1:22 PM   Specimen: Nasopharyngeal Swab; Nasopharyngeal(NP) swabs in vial transport medium  Result Value Ref Range Status   SARS Coronavirus 2 by RT PCR NEGATIVE NEGATIVE Final    Comment: (NOTE) SARS-CoV-2 target nucleic acids are NOT DETECTED.  The SARS-CoV-2  RNA is generally detectable in upper respiratory specimens during the acute phase of infection. The lowest concentration of SARS-CoV-2 viral copies this assay can detect is 138 copies/mL. A negative result does not preclude SARS-Cov-2 infection and should not be used as the sole basis for treatment or other patient management decisions. A negative result may occur with  improper specimen collection/handling, submission of specimen other than nasopharyngeal swab, presence of viral mutation(s) within the areas targeted by this assay, and inadequate number of viral copies(<138 copies/mL). A negative result must be  combined with clinical observations, patient history, and epidemiological information. The expected result is Negative.  Fact Sheet for Patients:  EntrepreneurPulse.com.au  Fact Sheet for Healthcare Providers:  IncredibleEmployment.be  This test is no t yet approved or cleared by the [place] FDA and  has been authorized for detection and/or diagnosis of SARS-CoV-2 by FDA under an Emergency Use Authorization (EUA). This EUA will remain  in effect (meaning this test can be used) for the duration of the COVID-19 declaration under Section 564(b)(1) of the Act, 21 U.S.C.section 360bbb-3(b)(1), unless the authorization is terminated  or revoked sooner.       Influenza A by PCR NEGATIVE NEGATIVE Final   Influenza B by PCR NEGATIVE NEGATIVE Final    Comment: (NOTE) The Xpert Xpress SARS-CoV-2/FLU/RSV plus assay is intended as an aid in the diagnosis of influenza from Nasopharyngeal swab specimens and should not be used as a sole basis for treatment. Nasal washings and aspirates are unacceptable for Xpert Xpress SARS-CoV-2/FLU/RSV testing.  Fact Sheet for Patients: EntrepreneurPulse.com.au  Fact Sheet for Healthcare Providers: IncredibleEmployment.be  This test is not yet approved or cleared by the [place] FDA and has been authorized for detection and/or diagnosis of SARS-CoV-2 by FDA under an Emergency Use Authorization (EUA). This EUA will remain in effect (meaning this test can be used) for the duration of the COVID-19 declaration under Section 564(b)(1) of the Act, 21 U.S.C. section 360bbb-3(b)(1), unless the authorization is terminated or revoked.  Performed at [facility], [address] [place], [place] [address]   Culture, blood (Routine X 2) w Reflex to ID Panel     Status: Abnormal   Collection Time: 08/11/21  5:47 PM   Specimen: BLOOD  Result Value Ref Range Status    Specimen Description   Final    BLOOD LEFT HAND Performed at [facility], [address] [place], [place] [address]    Special Requests   Final    BOTTLES DRAWN AEROBIC AND ANAEROBIC Blood Culture adequate volume Performed at [facility], [address] [place], [place] [address]    Culture  Setup Time   Final    GRAM POSITIVE COCCI IN CHAINS IN BOTH AEROBIC AND ANAEROBIC BOTTLES Gram Stain Report Called to,Read Back By and Verified With: JASON ROBBINS 08/12/21 @ 0507 BY SB Performed at [facility], [address] [place], [place] [address]    Culture (A)  Final    ENTEROCOCCUS FAECALIS SUSCEPTIBILITIES PERFORMED ON PREVIOUS CULTURE WITHIN THE LAST 5 DAYS. Performed at [facility], [place] [address] [place], [place] [address]    Report Status 08/14/2021 FINAL  Final  Culture, blood (Routine X 2) w Reflex to ID Panel     Status: Abnormal   Collection Time: 08/11/21  5:47 PM   Specimen: BLOOD  Result Value Ref Range Status   Specimen Description   Final    BLOOD FOA Performed at [facility],  [place], [place] [address]    Special Requests   Final    BOTTLES DRAWN AEROBIC AND ANAEROBIC Blood Culture adequate volume Performed at [facility], [address] [place], [place] [address]    Culture  Setup Time   Final    GRAM POSITIVE COCCI IN CHAINS IN BOTH AEROBIC AND ANAEROBIC BOTTLES Gram Stain Report Called to,Read Back By and Verified With: JASON ROBBINS 08/12/21 @ 0507 BY SB Performed at [facility], [place] [address] [place],  [address]    Culture ENTEROCOCCUS FAECALIS (A)  Final   Report Status 08/14/2021 FINAL  Final   Organism ID, Bacteria ENTEROCOCCUS FAECALIS  Final      Susceptibility   Enterococcus faecalis - MIC*    AMPICILLIN <=2 SENSITIVE Sensitive     VANCOMYCIN 2 SENSITIVE Sensitive     GENTAMICIN SYNERGY SENSITIVE Sensitive     * ENTEROCOCCUS FAECALIS  Blood Culture ID Panel  (Reflexed)     Status: Abnormal   Collection Time: 08/11/21  5:47 PM  Result Value Ref Range Status   Enterococcus faecalis DETECTED (A) NOT DETECTED Final    Comment: CRITICAL RESULT CALLED TO, READ BACK BY AND VERIFIED WITH: JASON ROBBINS 08/12/21 @ 0507 BY SB    Enterococcus Faecium NOT DETECTED NOT DETECTED Final   Listeria monocytogenes NOT DETECTED NOT DETECTED Final   Staphylococcus species NOT DETECTED NOT DETECTED Final   Staphylococcus aureus (BCID) NOT DETECTED NOT DETECTED Final   Staphylococcus epidermidis NOT DETECTED NOT DETECTED Final   Staphylococcus lugdunensis NOT DETECTED NOT DETECTED Final   Streptococcus species NOT DETECTED NOT DETECTED Final   Streptococcus agalactiae NOT DETECTED NOT DETECTED Final   Streptococcus pneumoniae NOT DETECTED NOT DETECTED Final   Streptococcus pyogenes NOT DETECTED NOT DETECTED Final   A.calcoaceticus-baumannii NOT DETECTED NOT DETECTED Final   Bacteroides fragilis NOT DETECTED NOT DETECTED Final   Enterobacterales NOT DETECTED NOT DETECTED Final   Enterobacter cloacae complex NOT DETECTED NOT DETECTED Final   Escherichia coli NOT DETECTED NOT DETECTED Final   Klebsiella aerogenes NOT DETECTED NOT DETECTED Final   Klebsiella oxytoca NOT DETECTED NOT DETECTED Final   Klebsiella pneumoniae NOT DETECTED NOT DETECTED Final   Proteus species NOT DETECTED NOT DETECTED Final   Salmonella species NOT DETECTED NOT DETECTED Final   Serratia marcescens NOT DETECTED NOT DETECTED Final   Haemophilus influenzae NOT DETECTED NOT DETECTED Final   Neisseria meningitidis NOT DETECTED NOT DETECTED Final   Pseudomonas aeruginosa NOT DETECTED NOT DETECTED Final   Stenotrophomonas maltophilia NOT DETECTED NOT DETECTED Final   Candida albicans NOT DETECTED NOT DETECTED Final   Candida auris NOT DETECTED NOT DETECTED Final   Candida glabrata NOT DETECTED NOT DETECTED Final   Candida krusei NOT DETECTED NOT DETECTED Final   Candida parapsilosis NOT  DETECTED NOT DETECTED Final   Candida tropicalis NOT DETECTED NOT DETECTED Final   Cryptococcus neoformans/gattii NOT DETECTED NOT DETECTED Final   Vancomycin resistance NOT DETECTED NOT DETECTED Final    Comment: Performed at [facility], [address] [place], [place] [address]  CULTURE, BLOOD (ROUTINE X 2) w Reflex to ID Panel     Status: None   Collection Time: 08/12/21 11:58 PM   Specimen: BLOOD  Result Value Ref Range Status   Specimen Description BLOOD BLOOD LEFT HAND  Final   Special Requests   Final    BOTTLES DRAWN AEROBIC AND ANAEROBIC Blood Culture adequate volume   Culture  Final    NO GROWTH 5 DAYS Performed at [facility], [address] [place], [place] [address]    Report Status 08/18/2021 FINAL  Final  CULTURE, BLOOD (ROUTINE X 2) w Reflex to ID Panel     Status: None   Collection Time: 08/12/21 11:58 PM   Specimen: BLOOD  Result Value Ref Range Status   Specimen Description BLOOD LEFT RADIAL  Final   Special Requests   Final    BOTTLES DRAWN AEROBIC AND ANAEROBIC Blood Culture results may not be optimal due to an inadequate volume of blood received in culture bottles   Culture   Final    NO GROWTH 5 DAYS Performed at [facility], [address], [place], [place] [address]    Report Status 08/18/2021 FINAL  Final    Coagulation Studies: No results for input(s): LABPROT, INR in the last 72 hours.  Urinalysis: No results for input(s): COLORURINE, LABSPEC, PHURINE, GLUCOSEU, HGBUR, BILIRUBINUR, KETONESUR, PROTEINUR, UROBILINOGEN, NITRITE, LEUKOCYTESUR in the last 72 hours.  Invalid input(s): APPERANCEUR    Imaging: No results found.   Medications:    ampicillin (OMNIPEN) IV 2 g (08/18/21 0086)   cefTRIAXone (ROCEPHIN)  IV 2 g (08/18/21 1105)   sodium chloride     sodium chloride       apixaban  2.5 mg Oral BID   calcium acetate  2,668 mg Oral TID WC   Chlorhexidine Gluconate Cloth  6 each Topical Q0600    epoetin (EPOGEN/PROCRIT) injection  4,000 Units Intravenous Q M,W,F-HD   feeding supplement (NEPRO CARB STEADY)  237 mL Oral TID BM   lidocaine  2 patch Transdermal Q24H   nicotine  21 mg Transdermal Daily   senna-docusate  1 tablet Oral BID   acetaminophen, bisacodyl, hydrALAZINE, LORazepam, Muscle Rub, ondansetron (ZOFRAN) IV, oxyCODONE-acetaminophen, polyethylene glycol  Assessment/ Plan:  [name] is a 60 y.o.  male with medical conditions including anemia, CHF, hypertension, and ESRD on dialysis. Patient has presented to ED with progressive back pain.  [place]/MWF/Rt AVG  1. End stage renal disease on dialysis Scheduled to receive dialysis today, in a chair. Concerned that back pain may hinder patient from receiving outpatient treatments. Will monitor progress. Will receive prescribed antibiotics with treatments  2. Anemia of chronic kidney disease Lab Results  Component Value Date   HGB 8.0 (L) 08/18/2021  Low dose Epogen  with dialysis. Hgb below target  3. Secondary Hyperparathyroidism:  Lab Results  Component Value Date   PTH 583.2 (H) 12/11/2012   CALCIUM 11.1 (H) 08/18/2021   CAION 1.27 (H) 12/26/2012   PHOS 6.2 (H) 08/16/2021  Calcium elevated Calcium acetate with meals  4. Sepsis with back pain believed due to enterococcus faecalis bacteremia. Spine MRI show multiple abnormalities noted. L4-L5 discitis and possble psoas phlegmon, C5-C6 marrow edema for possible evolving osteomyelitis. TEE completed today shows vegetation on aortic valve suggestive of endocarditis. ID recommending 6 weeks Ampicillin and Ceftriaxone antibiotics via central line. Antibiotics include ampicillin and ceftriaxone. Planned placement of central line today.     LOS: [place] 9/9/20221:08 PM

## 2021-08-18 NOTE — Plan of Care (Signed)
No acute events during the night. NAD noted. VSS. Oxygen saturations maintained on 2L O2 via [place]. Pending PICC placement. Pending dialysis today.  Problem: Education: Goal: Knowledge of General Education information will improve Description: Including pain rating scale, medication(s)/side effects and non-pharmacologic comfort measures Outcome: Progressing   Problem: Health Behavior/Discharge Planning: Goal: Ability to manage health-related needs will improve Outcome: Progressing   Problem: Clinical Measurements: Goal: Ability to maintain clinical measurements within normal limits will improve Outcome: Progressing Goal: Will remain free from infection Outcome: Progressing Goal: Diagnostic test results will improve Outcome: Progressing Goal: Respiratory complications will improve Outcome: Progressing Goal: Cardiovascular complication will be avoided Outcome: Progressing   Problem: Activity: Goal: Risk for activity intolerance will decrease Outcome: Progressing   Problem: Nutrition: Goal: Adequate nutrition will be maintained Outcome: Progressing   Problem: Coping: Goal: Level of anxiety will decrease Outcome: Progressing   Problem: Elimination: Goal: Will not experience complications related to bowel motility Outcome: Progressing Goal: Will not experience complications related to urinary retention Outcome: Progressing   Problem: Pain Managment: Goal: General experience of comfort will improve Outcome: Progressing   Problem: Safety: Goal: Ability to remain free from injury will improve Outcome: Progressing   Problem: Skin Integrity: Goal: Risk for impaired skin integrity will decrease Outcome: Progressing

## 2021-08-18 NOTE — Progress Notes (Signed)
Patient ID: DEVONDRE [name], male   DOB: [birth year], 60 y.o.   MRN: [id number]  PROGRESS NOTE    [name] [name]  TIR:[id number] DOB: 11-07-1961 DOA: 08/08/2021 PCP: Pcp, No   Brief Narrative:  60 y.o. male with medical history significant of ESRD-HD, hypertension, tobacco abuse, polysubstance abuse, diverticulitis, anemia, jugular vein thrombosis, GI bleeding, right pneumothorax presented with worsening lower back pain.  He was found to have sepsis secondary to Enterococcus faecalis bacteremia.  ID was consulted and he has been on ampicillin and ceftriaxone.  He was found to have possible developing osteomyelitis in C5-C6 with possible early left psoas abscess formation and possible early discitis in L4-L5.  Nephrology was also consulted for continuation of dialysis.  TEE showed aortic valve endocarditis.  Assessment & Plan:   Sepsis: Possibly evolving on admission Enterococcus faecalis bacteremia Aortic valve endocarditis -ID following.  Currently on Rocephin and ampicillin. -TTE negative for vegetations. 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  ID is recommending 6 to weeks of ampicillin and Rocephin till 09/23/2021 followed by p.o. amoxicillin for a few weeks.  -IR planning to place tunneled IJ catheter today. -Blood cultures from 08/11/2021 grew E faecalis.  Blood cultures from 08/12/2021 have been negative so far.  Severe lower back pain Possible developing osteomyelitis in C5-C6 Left psoas muscle infection with superimposed 1.3 cm phlegmon/early abscess formation Possible early discitis in L4-L5 -Continue antibiotics as above.  Continue pain management and bowel regimen  End-stage renal disease on hemodialysis -Nephrology following.  Dialysis as per nephrology schedule  Anemia of chronic disease -From renal failure.  Hemoglobin stable  Polysubstance abuse and tobacco abuse -Patient was counseled regarding cessation by prior hospitalist.  Continue nicotine patch  Acute embolism  and thrombosis of internal jugular vein -Eliquis has been changed from 5 mg daily to 2.5 mg twice daily as per Dr. Keturah Barre recommendations.  Continue Eliquis  Hypertension Hypotension -Blood pressure currently stable.  Off amlodipine  Chronic combined CHF -LVEF of 30 to 35% as per TTE on 08/13/2021.  Volume managed by dialysis.  Strict input output.  Daily weights.  Fluid restriction  Intermittent delirium -Monitor mental status.   -Currently mental status stable.  Bedside sitter has been discontinued.  Fall precautions.  Generalized conditioning -PT/OT recommend SNF placement.  Social worker following.  DVT prophylaxis: Eliquis Code Status: Full Family Communication: None at bedside Disposition Plan: Status is: Inpatient  Remains inpatient appropriate because:Inpatient level of care appropriate due to severity of illness  Dispo: The patient is from: Home              Anticipated d/c is to: SNF              Patient currently is medically stable to d/c. Patient can go to SNF once tunneled catheter placement is complete   Difficult to place patient No  Consultants: ID/cardiology  Procedures: TTE/TEE  Antimicrobials:  Anti-infectives (From admission, onward)    Start     Dose/Rate Route Frequency Ordered Stop   08/14/21 1530  cefTRIAXone (ROCEPHIN) 2 g in sodium chloride 0.9 % 100 mL IVPB        2 g 200 mL/hr over 30 Minutes Intravenous Every 12 hours 08/14/21 1443     08/14/21 1200  vancomycin (VANCOCIN) IVPB 1000 mg/200 mL premix  Status:  Discontinued        1,000 mg 200 mL/hr over 60 Minutes Intravenous Every M-W-F (Hemodialysis) 08/11/21 1831 08/12/21 0517   08/12/21 2200  cefTRIAXone (ROCEPHIN) 2  g in sodium chloride 0.9 % 100 mL IVPB  Status:  Discontinued        2 g 200 mL/hr over 30 Minutes Intravenous Every 12 hours 08/12/21 1923 08/14/21 1443   08/12/21 0615  ampicillin (OMNIPEN) 2 g in sodium chloride 0.9 % 100 mL IVPB        2 g 300 mL/hr over 20 Minutes  Intravenous Every 12 hours 08/12/21 0520     08/11/21 1930  vancomycin (VANCOREADY) IVPB 2000 mg/400 mL        2,000 mg 200 mL/hr over 120 Minutes Intravenous  Once 08/11/21 1831 08/12/21 0156   08/11/21 1900  cefTRIAXone (ROCEPHIN) 2 g in sodium chloride 0.9 % 100 mL IVPB  Status:  Discontinued        2 g 200 mL/hr over 30 Minutes Intravenous Every 12 hours 08/11/21 1829 08/12/21 0517        Subjective: Patient seen and examined at bedside.  He is a poor historian.  No seizures, agitation, chest pain or vomiting reported.  Objective: Vitals:   08/17/21 1742 08/17/21 2052 08/18/21 0114 08/18/21 0401  BP: (!) 110/56 (!) 132/51 127/67 132/68  Pulse: 82 (!) 104 (!) 105 (!) 102  Resp: 18 16 17 17   Temp: 98.2 F (36.8 C) 98.9 F (37.2 C) 98 F (36.7 C) 98.4 F (36.9 C)  TempSrc: Oral     SpO2: 100% 91% 94% 93%  Weight:      Height:        Intake/Output Summary (Last 24 hours) at 08/18/2021 0743 Last data filed at 08/17/2021 1840 Gross per 24 hour  Intake 240 ml  Output --  Net 240 ml    Filed Weights   08/08/21 0625 08/16/21 0754  Weight: 91 kg 91 kg    Examination:  General exam: Chronically ill looking and deconditioned.  Currently on room air.  No acute distress.   Respiratory system: Decreased breath sounds at bases bilaterally with scattered crackles  cardiovascular system: Tachycardic; S1-S2 heard  gastrointestinal system: Abdomen is mildly distended, soft and nontender.  Normal bowel sounds heard  extremities: No cyanosis; mild lower extremity edema present  Central nervous system: Still very slow to respond.  Poor historian.  No focal neurological deficits.  Moving extremities  skin: No obvious petechiae/rashes Psychiatry: Extremely flat affect.  Does not participate in conversation much.  Data Reviewed: I have personally reviewed following labs and imaging studies  CBC: Recent Labs  Lab 08/14/21 0614 08/15/21 0446 08/16/21 0429 08/17/21 0449  08/18/21 0416  WBC 8.2 8.5 8.6 7.7 9.0  HGB 8.6* 8.7* 8.8* 7.8* 8.0*  HCT 28.7* 27.3* 28.1* 24.5* 26.2*  MCV 96.0 97.2 95.6 96.1 94.6  PLT 180 179 211 195 811    Basic Metabolic Panel: Recent Labs  Lab 08/14/21 0614 08/15/21 0446 08/16/21 0429 08/17/21 0449 08/18/21 0416  NA 135 136 136 135 136  K 4.8 4.0 4.2 3.9 4.7  CL 96* 96* 96* 96* 95*  CO2 28 29 28 30 27   GLUCOSE 111* 102* 96 95 86  BUN 65* 46* 58* 38* 50*  CREATININE 9.28* 7.01* 8.68* 7.01* 8.54*  CALCIUM 10.0 10.1 10.9* 10.2 11.1*  MG 2.0 1.9 1.9 1.9 2.1  PHOS  --   --  6.2*  --   --     GFR: Estimated Creatinine Clearance: 10.8 mL/min (A) (by C-G formula based on SCr of 8.54 mg/dL (H)). Liver Function Tests: No results for input(s): AST, ALT, ALKPHOS, BILITOT, PROT,  ALBUMIN in the last 168 hours. No results for input(s): LIPASE, AMYLASE in the last 168 hours. No results for input(s): AMMONIA in the last 168 hours. Coagulation Profile: No results for input(s): INR, PROTIME in the last 168 hours. Cardiac Enzymes: No results for input(s): CKTOTAL, CKMB, CKMBINDEX, TROPONINI in the last 168 hours. BNP (last 3 results) No results for input(s): PROBNP in the last 8760 hours. HbA1C: No results for input(s): HGBA1C in the last 72 hours. CBG: Recent Labs  Lab 08/14/21 0742 08/14/21 1639  GLUCAP 93 346*    Lipid Profile: No results for input(s): CHOL, HDL, LDLCALC, TRIG, CHOLHDL, LDLDIRECT in the last 72 hours. Thyroid Function Tests: No results for input(s): TSH, T4TOTAL, FREET4, T3FREE, THYROIDAB in the last 72 hours. Anemia Panel: No results for input(s): VITAMINB12, FOLATE, FERRITIN, TIBC, IRON, RETICCTPCT in the last 72 hours. Sepsis Labs: Recent Labs  Lab 08/11/21 1747 08/11/21 2002 08/12/21 0613 08/13/21 0827  PROCALCITON  --   --  5.15 4.61  LATICACIDVEN 0.9 1.1  --   --      Recent Results (from the past 240 hour(s))  Resp Panel by RT-PCR (Flu A&B, Covid) Nasopharyngeal Swab     Status:  None   Collection Time: 08/08/21  1:22 PM   Specimen: Nasopharyngeal Swab; Nasopharyngeal(NP) swabs in vial transport medium  Result Value Ref Range Status   SARS Coronavirus 2 by RT PCR NEGATIVE NEGATIVE Final    Comment: (NOTE) SARS-CoV-2 target nucleic acids are NOT DETECTED.  The SARS-CoV-2 RNA is generally detectable in upper respiratory specimens during the acute phase of infection. The lowest concentration of SARS-CoV-2 viral copies this assay can detect is 138 copies/mL. A negative result does not preclude SARS-Cov-2 infection and should not be used as the sole basis for treatment or other patient management decisions. A negative result may occur with  improper specimen collection/handling, submission of specimen other than nasopharyngeal swab, presence of viral mutation(s) within the areas targeted by this assay, and inadequate number of viral copies(<138 copies/mL). A negative result must be combined with clinical observations, patient history, and epidemiological information. The expected result is Negative.  Fact Sheet for Patients:  EntrepreneurPulse.com.au  Fact Sheet for Healthcare Providers:  IncredibleEmployment.be  This test is no t yet approved or cleared by the [place] FDA and  has been authorized for detection and/or diagnosis of SARS-CoV-2 by FDA under an Emergency Use Authorization (EUA). This EUA will remain  in effect (meaning this test can be used) for the duration of the COVID-19 declaration under Section 564(b)(1) of the Act, 21 U.S.C.section 360bbb-3(b)(1), unless the authorization is terminated  or revoked sooner.       Influenza A by PCR NEGATIVE NEGATIVE Final   Influenza B by PCR NEGATIVE NEGATIVE Final    Comment: (NOTE) The Xpert Xpress SARS-CoV-2/FLU/RSV plus assay is intended as an aid in the diagnosis of influenza from Nasopharyngeal swab specimens and should not be used as a sole basis for  treatment. Nasal washings and aspirates are unacceptable for Xpert Xpress SARS-CoV-2/FLU/RSV testing.  Fact Sheet for Patients: EntrepreneurPulse.com.au  Fact Sheet for Healthcare Providers: IncredibleEmployment.be  This test is not yet approved or cleared by the [place] FDA and has been authorized for detection and/or diagnosis of SARS-CoV-2 by FDA under an Emergency Use Authorization (EUA). This EUA will remain in effect (meaning this test can be used) for the duration of the COVID-19 declaration under Section 564(b)(1) of the Act, 21 U.S.C. section 360bbb-3(b)(1), unless the authorization  is terminated or revoked.  Performed at [facility], [address] [place], [place] [address]   Culture, blood (Routine X 2) w Reflex to ID Panel     Status: Abnormal   Collection Time: 08/11/21  5:47 PM   Specimen: BLOOD  Result Value Ref Range Status   Specimen Description   Final    BLOOD LEFT HAND Performed at [facility], [address], [place], [place] [address]    Special Requests   Final    BOTTLES DRAWN AEROBIC AND ANAEROBIC Blood Culture adequate volume Performed at [facility], [address] [place], [place] [address]    Culture  Setup Time   Final    GRAM POSITIVE COCCI IN CHAINS IN BOTH AEROBIC AND ANAEROBIC BOTTLES Gram Stain Report Called to,Read Back By and Verified With: JASON ROBBINS 08/12/21 @ 0507 BY SB Performed at [facility], [address] [place], [place] [address]    Culture (A)  Final    ENTEROCOCCUS FAECALIS SUSCEPTIBILITIES PERFORMED ON PREVIOUS CULTURE WITHIN THE LAST 5 DAYS. Performed at [facility], [place] [address] [place], [place] [address]    Report Status 08/14/2021 FINAL  Final  Culture, blood (Routine X 2) w Reflex to ID Panel     Status: Abnormal   Collection Time: 08/11/21  5:47 PM   Specimen: BLOOD  Result Value Ref Range Status   Specimen  Description   Final    BLOOD FOA Performed at [facility], [address], [place], [place] [address]    Special Requests   Final    BOTTLES DRAWN AEROBIC AND ANAEROBIC Blood Culture adequate volume Performed at [facility], [address] [place], [place] [address]    Culture  Setup Time   Final    GRAM POSITIVE COCCI IN CHAINS IN BOTH AEROBIC AND ANAEROBIC BOTTLES Gram Stain Report Called to,Read Back By and Verified With: JASON ROBBINS 08/12/21 @ 0507 BY SB Performed at [facility], [place] [address] [place], [place] [address]    Culture ENTEROCOCCUS FAECALIS (A)  Final   Report Status 08/14/2021 FINAL  Final   Organism ID, Bacteria ENTEROCOCCUS FAECALIS  Final      Susceptibility   Enterococcus faecalis - MIC*    AMPICILLIN <=2 SENSITIVE Sensitive     VANCOMYCIN 2 SENSITIVE Sensitive     GENTAMICIN SYNERGY SENSITIVE Sensitive     * ENTEROCOCCUS FAECALIS  Blood Culture ID Panel (Reflexed)     Status: Abnormal   Collection Time: 08/11/21  5:47 PM  Result Value Ref Range Status   Enterococcus faecalis DETECTED (A) NOT DETECTED Final    Comment: CRITICAL RESULT CALLED TO, READ BACK BY AND VERIFIED WITH: JASON ROBBINS 08/12/21 @ 0507 BY SB    Enterococcus Faecium NOT DETECTED NOT DETECTED Final   Listeria monocytogenes NOT DETECTED NOT DETECTED Final   Staphylococcus species NOT DETECTED NOT DETECTED Final   Staphylococcus aureus (BCID) NOT DETECTED NOT DETECTED Final   Staphylococcus epidermidis NOT DETECTED NOT DETECTED Final   Staphylococcus lugdunensis NOT DETECTED NOT DETECTED Final   Streptococcus species NOT DETECTED NOT DETECTED Final   Streptococcus agalactiae NOT DETECTED NOT DETECTED Final   Streptococcus pneumoniae NOT DETECTED NOT DETECTED Final   Streptococcus pyogenes NOT DETECTED NOT DETECTED Final   A.calcoaceticus-baumannii NOT DETECTED NOT DETECTED Final   Bacteroides fragilis NOT DETECTED NOT DETECTED Final   Enterobacterales NOT  DETECTED NOT DETECTED Final   Enterobacter cloacae complex  NOT DETECTED NOT DETECTED Final   Escherichia coli NOT DETECTED NOT DETECTED Final   Klebsiella aerogenes NOT DETECTED NOT DETECTED Final   Klebsiella oxytoca NOT DETECTED NOT DETECTED Final   Klebsiella pneumoniae NOT DETECTED NOT DETECTED Final   Proteus species NOT DETECTED NOT DETECTED Final   Salmonella species NOT DETECTED NOT DETECTED Final   Serratia marcescens NOT DETECTED NOT DETECTED Final   Haemophilus influenzae NOT DETECTED NOT DETECTED Final   Neisseria meningitidis NOT DETECTED NOT DETECTED Final   Pseudomonas aeruginosa NOT DETECTED NOT DETECTED Final   Stenotrophomonas maltophilia NOT DETECTED NOT DETECTED Final   Candida albicans NOT DETECTED NOT DETECTED Final   Candida auris NOT DETECTED NOT DETECTED Final   Candida glabrata NOT DETECTED NOT DETECTED Final   Candida krusei NOT DETECTED NOT DETECTED Final   Candida parapsilosis NOT DETECTED NOT DETECTED Final   Candida tropicalis NOT DETECTED NOT DETECTED Final   Cryptococcus neoformans/gattii NOT DETECTED NOT DETECTED Final   Vancomycin resistance NOT DETECTED NOT DETECTED Final    Comment: Performed at [facility], [address] [place], [place] [address]  CULTURE, BLOOD (ROUTINE X 2) w Reflex to ID Panel     Status: None (Preliminary result)   Collection Time: 08/12/21 11:58 PM   Specimen: BLOOD  Result Value Ref Range Status   Specimen Description BLOOD BLOOD LEFT HAND  Final   Special Requests   Final    BOTTLES DRAWN AEROBIC AND ANAEROBIC Blood Culture adequate volume   Culture   Final    NO GROWTH 4 DAYS Performed at [facility], [address] [place], [place] [address]    Report Status PENDING  Incomplete  CULTURE, BLOOD (ROUTINE X 2) w Reflex to ID Panel     Status: None (Preliminary result)   Collection Time: 08/12/21 11:58 PM   Specimen: BLOOD  Result Value Ref Range Status   Specimen Description BLOOD LEFT RADIAL   Final   Special Requests   Final    BOTTLES DRAWN AEROBIC AND ANAEROBIC Blood Culture results may not be optimal due to an inadequate volume of blood received in culture bottles   Culture   Final    NO GROWTH 4 DAYS Performed at [facility], [address], [place], [place] [address]    Report Status PENDING  Incomplete          Radiology Studies: ECHO TEE  Result Date: 08/16/2021    TRANSESOPHOGEAL ECHO REPORT   Patient Name:   [name] [name] Date of Exam: 08/16/2021 Medical Rec #:  [id number]     Height:       73.0 in Accession #:    [id number]    Weight:       200.6 lb Date of Birth:  08-01-1961     BSA:          2.154 m Patient Age:    19 years      BP:           134/78 mmHg Patient Gender: M             HR:           99 bpm. Exam Location:  [facility] Procedure: Transesophageal Echo and Color Doppler Indications:     Endocarditis  History:         Patient has prior history of Echocardiogram examinations, most                  recent  08/13/2021. ESRD; Risk Factors:Hypertension and Current                  Smoker. Hx of polysubstance abuse.  Sonographer:     Charmayne Sheer Referring Phys:  [place] Diagnosing Phys: [place]: The transesophogeal probe was passed without difficulty through the esophogus of the patient. Sedation performed by performing physician. The patient developed no complications during the procedure. IMPRESSIONS  1. Left ventricular ejection fraction, by estimation, is 55 to 60%. The left ventricle has normal function. The left ventricular internal cavity size was mildly dilated.  2. Right ventricular systolic function is mildly reduced. The right ventricular size is mildly enlarged.  3. Left atrial size was moderately dilated. No left atrial/left atrial appendage thrombus was detected.  4. Right atrial size was mild to moderately dilated.  5. 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  6. 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  7. 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 The aortic valve is abnormal. Aortic valve regurgitation is severe. Mild to moderate aortic valve sclerosis/calcification is present, without any evidence of aortic stenosis. Conclusion(s)/Recommendation(s): Findings are concerning for vegetation/infective endocarditis as detailed above. Findings concerning for aortic valve vegetation. FINDINGS  Left Ventricle: Left ventricular ejection fraction, by estimation, is 55 to 60%. The left ventricle has normal function. The left ventricular internal cavity size was mildly dilated. Right Ventricle: The right ventricular size is mildly enlarged. No increase in right ventricular wall thickness. Right ventricular systolic function is mildly reduced. Left Atrium: Left atrial size was moderately dilated. No left atrial/left atrial appendage thrombus was detected. Right Atrium: Right atrial size was mild to moderately dilated. Pericardium: There is no evidence of pericardial effusion. 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 The aortic valve is abnormal. Aortic valve regurgitation is severe. Mild to moderate aortic valve sclerosis/calcification is present, without any evidence of aortic stenosis. Pulmonic Valve: The pulmonic valve was normal in structure. Pulmonic valve regurgitation is not visualized. Aorta: The aortic root was not well visualized. IAS/Shunts: No atrial level shunt detected by color flow Doppler. [place] Electronically signed by Neoma Laming Signature Date/Time: 08/16/2021/9:28:16 AM    Final         Scheduled Meds:  apixaban  2.5 mg Oral BID   calcium acetate  2,668 mg Oral TID WC   Chlorhexidine Gluconate Cloth  6 each Topical  Q0600   epoetin (EPOGEN/PROCRIT) injection  4,000 Units Intravenous Q M,W,F-HD   feeding supplement (NEPRO CARB STEADY)  237 mL Oral TID BM   lidocaine  2 patch Transdermal Q24H   nicotine  21 mg Transdermal Daily   senna-docusate  1 tablet Oral BID   Continuous Infusions:  ampicillin (OMNIPEN) IV 2 g (08/18/21 7858)   cefTRIAXone (ROCEPHIN)  IV 2 g (08/17/21 2106)   sodium chloride     sodium chloride            Aline August, MD [facility] 08/18/2021, 7:43 AM

## 2021-08-19 DIAGNOSIS — I82C19 Acute embolism and thrombosis of unspecified internal jugular vein: Secondary | ICD-10-CM | POA: Diagnosis not present

## 2021-08-19 DIAGNOSIS — F1910 Other psychoactive substance abuse, uncomplicated: Secondary | ICD-10-CM | POA: Diagnosis not present

## 2021-08-19 DIAGNOSIS — M5440 Lumbago with sciatica, unspecified side: Secondary | ICD-10-CM | POA: Diagnosis not present

## 2021-08-19 DIAGNOSIS — N186 End stage renal disease: Secondary | ICD-10-CM | POA: Diagnosis not present

## 2021-08-19 LAB — BASIC METABOLIC PANEL
Anion gap: 14 (ref 5–15)
BUN: 65 mg/dL — ABNORMAL HIGH (ref 6–20)
CO2: 28 mmol/L (ref 22–32)
Calcium: 10.8 mg/dL — ABNORMAL HIGH (ref 8.9–10.3)
Chloride: 96 mmol/L — ABNORMAL LOW (ref 98–111)
Creatinine, Ser: 9.93 mg/dL — ABNORMAL HIGH (ref 0.61–1.24)
GFR, Estimated: 6 mL/min — ABNORMAL LOW (ref >60)
Glucose, Bld: 85 mg/dL (ref 70–99)
Potassium: 4.8 mmol/L (ref 3.5–5.1)
Sodium: 138 mmol/L (ref 135–145)

## 2021-08-19 LAB — CBC
HCT: 27.3 % — ABNORMAL LOW (ref 39.0–52.0)
Hemoglobin: 8.7 g/dL — ABNORMAL LOW (ref 13.0–17.0)
MCH: 31.4 pg (ref 26.0–34.0)
MCHC: 31.9 g/dL (ref 30.0–36.0)
MCV: 98.6 fL (ref 80.0–100.0)
Platelets: 233 10*3/uL (ref 150–400)
RBC: 2.77 MIL/uL — ABNORMAL LOW (ref 4.22–5.81)
RDW: 17.9 % — ABNORMAL HIGH (ref 11.5–15.5)
WBC: 8.6 10*3/uL (ref 4.0–10.5)
nRBC: 0 % (ref 0.0–0.2)

## 2021-08-19 LAB — MAGNESIUM: Magnesium: 2.3 mg/dL (ref 1.7–2.4)

## 2021-08-19 MED ORDER — SEVELAMER CARBONATE 800 MG PO TABS
1600.0000 mg | ORAL_TABLET | Freq: Three times a day (TID) | ORAL | Status: DC
  Administered 2021-08-19 – 2021-08-29 (×13): 1600 mg via ORAL
  Filled 2021-08-19 (×19): qty 2

## 2021-08-19 MED ORDER — EPOETIN ALFA 10000 UNIT/ML IJ SOLN
INTRAMUSCULAR | Status: AC
  Administered 2021-08-19: 10000 [IU]
  Filled 2021-08-19: qty 1

## 2021-08-19 NOTE — Progress Notes (Signed)
Pre HD RN assessment 

## 2021-08-19 NOTE — Progress Notes (Signed)
Patient ID: [name] [name], male   DOB: March 21, 1961, 60 y.o.   MRN: [id number]  PROGRESS NOTE    [name] [name]  OHY:[id number] DOB: [birth year] DOA: 08/08/2021 PCP: Pcp, No   Brief Narrative:  60 y.o. male with medical history significant of ESRD-HD, hypertension, tobacco abuse, polysubstance abuse, diverticulitis, anemia, jugular vein thrombosis, GI bleeding, right pneumothorax presented with worsening lower back pain.  He was found to have sepsis secondary to Enterococcus faecalis bacteremia.  ID was consulted and he has been on ampicillin and ceftriaxone.  He was found to have possible developing osteomyelitis in C5-C6 with possible early left psoas abscess formation and possible early discitis in L4-L5.  Nephrology was also consulted for continuation of dialysis.  TEE showed aortic valve endocarditis.  Assessment & Plan:   Sepsis: Possibly evolving on admission Enterococcus faecalis bacteremia Aortic valve endocarditis -ID following.  Currently on Rocephin and ampicillin. -TTE negative for vegetations. 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  ID is recommending 6 to weeks of ampicillin and Rocephin till 09/23/2021 followed by p.o. amoxicillin for a few weeks.  -Status post tunneled catheter placement on 08/18/2021 by IR -Blood cultures from 08/11/2021 grew E faecalis.  Blood cultures from 08/12/2021 have been negative so far.  Severe lower back pain Possible developing osteomyelitis in C5-C6 Left psoas muscle infection with superimposed 1.3 cm phlegmon/early abscess formation Possible early discitis in L4-L5 -Continue antibiotics as above.  Continue pain management and bowel regimen  End-stage renal disease on hemodialysis -Nephrology following.  Dialysis as per nephrology schedule  Anemia of chronic disease -From renal failure.  Hemoglobin stable  Polysubstance abuse and tobacco abuse -Patient was counseled regarding cessation by prior hospitalist.  Continue nicotine patch  Acute  embolism and thrombosis of internal jugular vein -Eliquis has been changed from 5 mg daily to 2.5 mg twice daily as per Dr. Keturah Barre recommendations.  Continue Eliquis  Hypertension Hypotension -Blood pressure currently stable.  Off amlodipine  Chronic combined CHF -LVEF of 30 to 35% as per TTE on 08/13/2021.  Volume managed by dialysis.  Strict input output.  Daily weights.  Fluid restriction  Intermittent delirium -Monitor mental status.   -Currently mental status stable.  Bedside sitter has been discontinued.  Fall precautions.  Generalized conditioning -PT/OT recommend SNF placement.  Social worker following.  DVT prophylaxis: Eliquis Code Status: Full Family Communication: None at bedside Disposition Plan: Status is: Inpatient  Remains inpatient appropriate because:Inpatient level of care appropriate due to severity of illness  Dispo: The patient is from: Home              Anticipated d/c is to: SNF              Patient currently is medically stable to d/c.    Difficult to place patient No  Consultants: ID/cardiology  Procedures: TTE/TEE Tunneled catheter placement by IR on 08/18/2021  Antimicrobials:  Anti-infectives (From admission, onward)    Start     Dose/Rate Route Frequency Ordered Stop   08/14/21 1530  cefTRIAXone (ROCEPHIN) 2 g in sodium chloride 0.9 % 100 mL IVPB        2 g 200 mL/hr over 30 Minutes Intravenous Every 12 hours 08/14/21 1443     08/14/21 1200  vancomycin (VANCOCIN) IVPB 1000 mg/200 mL premix  Status:  Discontinued        1,000 mg 200 mL/hr over 60 Minutes Intravenous Every M-W-F (Hemodialysis) 08/11/21 1831 08/12/21 0517   08/12/21 2200  cefTRIAXone (ROCEPHIN) 2 g in  sodium chloride 0.9 % 100 mL IVPB  Status:  Discontinued        2 g 200 mL/hr over 30 Minutes Intravenous Every 12 hours 08/12/21 1923 08/14/21 1443   08/12/21 0615  ampicillin (OMNIPEN) 2 g in sodium chloride 0.9 % 100 mL IVPB        2 g 300 mL/hr over 20 Minutes Intravenous  Every 12 hours 08/12/21 0520     08/11/21 1930  vancomycin (VANCOREADY) IVPB 2000 mg/400 mL        2,000 mg 200 mL/hr over 120 Minutes Intravenous  Once 08/11/21 1831 08/12/21 0156   08/11/21 1900  cefTRIAXone (ROCEPHIN) 2 g in sodium chloride 0.9 % 100 mL IVPB  Status:  Discontinued        2 g 200 mL/hr over 30 Minutes Intravenous Every 12 hours 08/11/21 1829 08/12/21 0517        Subjective: Patient seen and examined at bedside.  He is a poor historian.  No fever, vomiting or worsening shortness of breath reported.  Patient is not participating in physical therapy much as per nursing staff. Objective: Vitals:   08/18/21 1608 08/18/21 1646 08/18/21 1948 08/19/21 0418  BP: (!) 191/55 135/75 117/77 129/70  Pulse: (!) 104 (!) 102 91 90  Resp: (!) _0 Temp:  98.8 F (37.1 C) 98.2 F (36.8 C) (!) 97.4 F (36.3 C)  TempSrc:   Oral Oral  SpO2: 100% 100% 99% 98%  Weight:      Height:        Intake/Output Summary (Last 24 hours) at 08/19/2021 0738 Last data filed at 08/19/2021 0415 Gross per 24 hour  Intake --  Output 1 ml  Net -1 ml    Filed Weights   08/08/21 0625 08/16/21 0754  Weight: 91 kg 91 kg    Examination:  General exam: Chronically ill looking and deconditioned.  No distress.  On room air currently.   Respiratory system: Bilateral decreased breath sounds at bases with some crackles  cardiovascular system: S1-S2 heard; intermittently tachycardic gastrointestinal system: Abdomen is distended mildly, soft and nontender.  Bowel sounds are heard  extremities: Trace lower extremity edema present; no clubbing  Central nervous system: Extremely slow to respond.  Poor historian.  No focal neurological deficits.  Moves extremities  skin: No obvious ecchymosis/lesions  psychiatry: Hardly participates in any conversation.  Affect is flat.  Data Reviewed: I have personally reviewed following labs and imaging studies  CBC: Recent Labs  Lab 08/15/21 0446  08/16/21 0429 08/17/21 0449 08/18/21 0416 08/19/21 0433  WBC 8.5 8.6 7.7 9.0 8.6  HGB 8.7* 8.8* 7.8* 8.0* 8.7*  HCT 27.3* 28.1* 24.5* 26.2* 27.3*  MCV 97.2 95.6 96.1 94.6 98.6  PLT 179 211 195 215 388    Basic Metabolic Panel: Recent Labs  Lab 08/15/21 0446 08/16/21 0429 08/17/21 0449 08/18/21 0416 08/19/21 0433  NA 136 136 135 136 138  K 4.0 4.2 3.9 4.7 4.8  CL 96* 96* 96* 95* 96*  CO2 _1 GLUCOSE 102* 96 95 86 85  BUN 46* 58* 38* 50* 65*  CREATININE 7.01* 8.68* 7.01* 8.54* 9.93*  CALCIUM 10.1 10.9* 10.2 11.1* 10.8*  MG 1.9 1.9 1.9 2.1 2.3  PHOS  --  6.2*  --   --   --     GFR: Estimated Creatinine Clearance: 9.3 mL/min (A) (by C-G formula based on SCr of 9.93 mg/dL (H)). Liver Function Tests: No results for  input(s): AST, ALT, ALKPHOS, BILITOT, PROT, ALBUMIN in the last 168 hours. No results for input(s): LIPASE, AMYLASE in the last 168 hours. No results for input(s): AMMONIA in the last 168 hours. Coagulation Profile: No results for input(s): INR, PROTIME in the last 168 hours. Cardiac Enzymes: No results for input(s): CKTOTAL, CKMB, CKMBINDEX, TROPONINI in the last 168 hours. BNP (last 3 results) No results for input(s): PROBNP in the last 8760 hours. HbA1C: No results for input(s): HGBA1C in the last 72 hours. CBG: Recent Labs  Lab 08/14/21 0742 08/14/21 1639  GLUCAP 93 346*    Lipid Profile: No results for input(s): CHOL, HDL, LDLCALC, TRIG, CHOLHDL, LDLDIRECT in the last 72 hours. Thyroid Function Tests: No results for input(s): TSH, T4TOTAL, FREET4, T3FREE, THYROIDAB in the last 72 hours. Anemia Panel: No results for input(s): VITAMINB12, FOLATE, FERRITIN, TIBC, IRON, RETICCTPCT in the last 72 hours. Sepsis Labs: Recent Labs  Lab 08/13/21 0827  PROCALCITON 4.61     Recent Results (from the past 240 hour(s))  Culture, blood (Routine X 2) w Reflex to ID Panel     Status: Abnormal   Collection Time: 08/11/21  5:47 PM   Specimen:  BLOOD  Result Value Ref Range Status   Specimen Description   Final    BLOOD LEFT HAND Performed at [facility], [address] [place], [place] [address]    Special Requests   Final    BOTTLES DRAWN AEROBIC AND ANAEROBIC Blood Culture adequate volume Performed at [facility], [address] [place], [place] [address]    Culture  Setup Time   Final    GRAM POSITIVE COCCI IN CHAINS IN BOTH AEROBIC AND ANAEROBIC BOTTLES Gram Stain Report Called to,Read Back By and Verified With: JASON ROBBINS 08/12/21 @ 0507 BY SB Performed at [facility], [address] [place], [place] [address]    Culture (A)  Final    ENTEROCOCCUS FAECALIS SUSCEPTIBILITIES PERFORMED ON PREVIOUS CULTURE WITHIN THE LAST 5 DAYS. Performed at [facility], [place] [address] [place], [place] [address]    Report Status 08/14/2021 FINAL  Final  Culture, blood (Routine X 2) w Reflex to ID Panel     Status: Abnormal   Collection Time: 08/11/21  5:47 PM   Specimen: BLOOD  Result Value Ref Range Status   Specimen Description   Final    BLOOD FOA Performed at [facility], [address] [place], [place] [address]    Special Requests   Final    BOTTLES DRAWN AEROBIC AND ANAEROBIC Blood Culture adequate volume Performed at [facility], [address] [place], [place] [address]    Culture  Setup Time   Final    GRAM POSITIVE COCCI IN CHAINS IN BOTH AEROBIC AND ANAEROBIC BOTTLES Gram Stain Report Called to,Read Back By and Verified With: JASON ROBBINS 08/12/21 @ 0507 BY SB Performed at [facility], [place] [address], [place],  [address]    Culture ENTEROCOCCUS FAECALIS (A)  Final   Report Status 08/14/2021 FINAL  Final   Organism ID, Bacteria ENTEROCOCCUS FAECALIS  Final      Susceptibility   Enterococcus faecalis - MIC*    AMPICILLIN <=2 SENSITIVE Sensitive     VANCOMYCIN 2 SENSITIVE Sensitive     GENTAMICIN SYNERGY SENSITIVE Sensitive     *  ENTEROCOCCUS FAECALIS  Blood Culture ID Panel (Reflexed)     Status: Abnormal   Collection Time: 08/11/21  5:47 PM  Result Value Ref Range Status   Enterococcus faecalis DETECTED (A) NOT DETECTED Final    Comment: CRITICAL RESULT CALLED TO, READ BACK BY AND VERIFIED WITH: JASON ROBBINS 08/12/21 @ 0507 BY SB    Enterococcus Faecium NOT DETECTED NOT DETECTED Final   Listeria monocytogenes NOT DETECTED NOT DETECTED Final   Staphylococcus species NOT DETECTED NOT DETECTED Final   Staphylococcus aureus (BCID) NOT DETECTED NOT DETECTED Final   Staphylococcus epidermidis NOT DETECTED NOT DETECTED Final   Staphylococcus lugdunensis NOT DETECTED NOT DETECTED Final   Streptococcus species NOT DETECTED NOT DETECTED Final   Streptococcus agalactiae NOT DETECTED NOT DETECTED Final   Streptococcus pneumoniae NOT DETECTED NOT DETECTED Final   Streptococcus pyogenes NOT DETECTED NOT DETECTED Final   A.calcoaceticus-baumannii NOT DETECTED NOT DETECTED Final   Bacteroides fragilis NOT DETECTED NOT DETECTED Final   Enterobacterales NOT DETECTED NOT DETECTED Final   Enterobacter cloacae complex NOT DETECTED NOT DETECTED Final   Escherichia coli NOT DETECTED NOT DETECTED Final   Klebsiella aerogenes NOT DETECTED NOT DETECTED Final   Klebsiella oxytoca NOT DETECTED NOT DETECTED Final   Klebsiella pneumoniae NOT DETECTED NOT DETECTED Final   Proteus species NOT DETECTED NOT DETECTED Final   Salmonella species NOT DETECTED NOT DETECTED Final   Serratia marcescens NOT DETECTED NOT DETECTED Final   Haemophilus influenzae NOT DETECTED NOT DETECTED Final   Neisseria meningitidis NOT DETECTED NOT DETECTED Final   Pseudomonas aeruginosa NOT DETECTED NOT DETECTED Final   Stenotrophomonas maltophilia NOT DETECTED NOT DETECTED Final   Candida albicans NOT DETECTED NOT DETECTED Final   Candida auris NOT DETECTED NOT DETECTED Final   Candida glabrata NOT DETECTED NOT DETECTED Final   Candida krusei NOT DETECTED NOT  DETECTED Final   Candida parapsilosis NOT DETECTED NOT DETECTED Final   Candida tropicalis NOT DETECTED NOT DETECTED Final   Cryptococcus neoformans/gattii NOT DETECTED NOT DETECTED Final   Vancomycin resistance NOT DETECTED NOT DETECTED Final    Comment: Performed at [facility], [address] [place], [place] [address]  CULTURE, BLOOD (ROUTINE X 2) w Reflex to ID Panel     Status: None   Collection Time: 08/12/21 11:58 PM   Specimen: BLOOD  Result Value Ref Range Status   Specimen Description BLOOD BLOOD LEFT HAND  Final   Special Requests   Final    BOTTLES DRAWN AEROBIC AND ANAEROBIC Blood Culture adequate volume   Culture   Final    NO GROWTH 5 DAYS Performed at [facility], [address] [place], [place] [address]    Report Status 08/18/2021 FINAL  Final  CULTURE, BLOOD (ROUTINE X 2) w Reflex to ID Panel     Status: None   Collection Time: 08/12/21 11:58 PM   Specimen: BLOOD  Result Value Ref Range Status   Specimen Description BLOOD LEFT RADIAL  Final   Special Requests   Final    BOTTLES DRAWN AEROBIC AND ANAEROBIC Blood Culture results may not be optimal due to an inadequate volume of blood received in culture bottles   Culture   Final    NO GROWTH 5 DAYS Performed at [facility], [address], [place], [place] [address]    Report Status 08/18/2021 FINAL  Final          Radiology Studies: IR Fluoro Guide CV Line Left  Result Date: 08/18/2021 INDICATION: Long-term antibiotics.  Poor IV access EXAM: ULTRASOUND AND FLUOROSCOPIC GUIDED PLACEMENT OF TUNNELED CENTRAL VENOUS CATHETER MEDICATIONS: 20 mL of  Isovue-300 intravenous contrast. ANESTHESIA/SEDATION: Versed 0 mg IV; Fentanyl 25 mcg IV; Sedation Time: 45 minutes. The patient was continuously monitored during the procedure by the interventional radiology nurse under my direct supervision. FLUOROSCOPY TIME:  8 minutes and 0 seconds (633.3 mGy) COMPLICATIONS: None immediate.  PROCEDURE: Informed written consent was obtained from the patient after a discussion of the risks, benefits, and alternatives to treatment. Questions regarding the procedure were encouraged and answered. 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 The LEFT neck and chest were prepped with chlorhexidine in a sterile fashion, and a sterile drape was applied covering the operative field. Maximum barrier sterile technique with sterile gowns and gloves were used for the procedure. A timeout was performed prior to the initiation of the procedure. After the overlying soft tissues were anesthetized, a small venotomy incision was created and a micropuncture kit was utilized to access the LEFT internal jugular vein. Real-time ultrasound guidance was utilized for vascular access including the acquisition of a permanent ultrasound image documenting patency of the accessed vessel. The patient has an indwelling RIGHT upper extremity HERO graft. Multiple attempts at accessing the RIGHT atrium along the graft catheter using a Kumpe catheter and 0.035 inch glidewire were unsuccessful. Therefore a digital subtraction angiogram (DSA) of the central veins was obtained demonstrating central venous occlusion and dilated azygous vein. The decision was made to leave the catheter tip within the azygous. A microwire was utilized to measure appropriate catheter length. The micropuncture sheath was exchanged for a peel-away sheath over a guidewire. A 5 French single lumen tunneled central venous catheter measuring 25 cm was tunneled in a retrograde fashion from the anterior chest wall to the venotomy incision. The catheter was then placed through the peel-away sheath with tip ultimately positioned at the SVC / azygous junction. Final catheter positioning was confirmed and documented with a spot radiographic image. The catheter aspirates and flushes normally. The catheter was  flushed with appropriate volume heparin dwells. The catheter exit site was secured with a 2-0 nylon retention suture. The venotomy incision was closed with Dermabond. Dressings were applied. The patient tolerated the procedure well without immediate post procedural complication. FINDINGS: 1. 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 2. Azygous vein appears to be the dominant upper extremity cephalic venous outflow (see key image). 3. New, 5 French tunneled central venous catheter tip intentionally placed at the SVC/azygous junction. IMPRESSION: 1. Successful placement of 5 French, 25 cm single lumen tunneled "Powerline" central venous catheter via the LEFT internal jugular vein, as above. 2. 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 3. New catheter tip intentionally positioned at the SVC/azygous junction, and may migrate into the azygous vein on mobilization. PLAN: The patient may benefit from central venous recanalization. Please place referral to vascular interventional radiology for evaluation. Michaelle Birks, MD [facility] Radiology Electronically Signed   By: Michaelle Birks M.D.   On: 08/18/2021 18:56   IR US Guidance  Result Date: 08/18/2021 INDICATION: Long-term antibiotics.  Poor IV access EXAM: ULTRASOUND AND FLUOROSCOPIC GUIDED PLACEMENT OF TUNNELED CENTRAL VENOUS CATHETER MEDICATIONS: 20 mL of Isovue-300 intravenous contrast. ANESTHESIA/SEDATION: Versed 0 mg IV; Fentanyl 25 mcg IV; Sedation Time: 45 minutes. The patient was continuously monitored during the procedure by the interventional radiology nurse under my direct supervision. FLUOROSCOPY TIME:  8 minutes and 0 seconds (103.9 mGy)  COMPLICATIONS: None immediate. PROCEDURE: Informed written consent was obtained from the patient after a discussion of the risks, benefits,  and alternatives to treatment. Questions regarding the procedure were encouraged and answered. 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 The LEFT neck and chest were prepped with chlorhexidine in a sterile fashion, and a sterile drape was applied covering the operative field. Maximum barrier sterile technique with sterile gowns and gloves were used for the procedure. A timeout was performed prior to the initiation of the procedure. After the overlying soft tissues were anesthetized, a small venotomy incision was created and a micropuncture kit was utilized to access the LEFT internal jugular vein. Real-time ultrasound guidance was utilized for vascular access including the acquisition of a permanent ultrasound image documenting patency of the accessed vessel. The patient has an indwelling RIGHT upper extremity HERO graft. Multiple attempts at accessing the RIGHT atrium along the graft catheter using a Kumpe catheter and 0.035 inch glidewire were unsuccessful. Therefore a digital subtraction angiogram (DSA) of the central veins was obtained demonstrating central venous occlusion and dilated azygous vein. The decision was made to leave the catheter tip within the azygous. A microwire was utilized to measure appropriate catheter length. The micropuncture sheath was exchanged for a peel-away sheath over a guidewire. A 5 French single lumen tunneled central venous catheter measuring 25 cm was tunneled in a retrograde fashion from the anterior chest wall to the venotomy incision. The catheter was then placed through the peel-away sheath with tip ultimately positioned at the SVC / azygous junction. Final catheter positioning was confirmed and documented with a spot radiographic image. The catheter aspirates and flushes normally. The catheter was flushed with appropriate volume heparin dwells. The catheter exit site was secured with a 2-0 nylon retention  suture. The venotomy incision was closed with Dermabond. Dressings were applied. The patient tolerated the procedure well without immediate post procedural complication. FINDINGS: 1. 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 2. Azygous vein appears to be the dominant upper extremity cephalic venous outflow (see key image). 3. New, 5 French tunneled central venous catheter tip intentionally placed at the SVC/azygous junction. IMPRESSION: 1. Successful placement of 5 French, 25 cm single lumen tunneled "Powerline" central venous catheter via the LEFT internal jugular vein, as above. 2. 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 3. New catheter tip intentionally positioned at the SVC/azygous junction, and may migrate into the azygous vein on mobilization. PLAN: The patient may benefit from central venous recanalization. Please place referral to vascular interventional radiology for evaluation. Michaelle Birks, MD [facility] Radiology Electronically Signed   By: Michaelle Birks M.D.   On: 08/18/2021 18:56        Scheduled Meds:  apixaban  2.5 mg Oral BID   calcium acetate  2,668 mg Oral TID WC   Chlorhexidine Gluconate Cloth  6 each Topical Q0600   epoetin (EPOGEN/PROCRIT) injection  4,000 Units Intravenous Q M,W,F-HD   feeding supplement (NEPRO CARB STEADY)  237 mL Oral TID BM   lidocaine  2 patch Transdermal Q24H   nicotine  21 mg Transdermal Daily   senna-docusate  1 tablet Oral BID   Continuous Infusions:  ampicillin (OMNIPEN) IV 2 g (08/19/21 0530)   cefTRIAXone (ROCEPHIN)  IV 2 g (08/18/21 2219)   sodium chloride     sodium chloride  Aline August, MD [facility] 08/19/2021, 7:38 AM

## 2021-08-19 NOTE — Progress Notes (Signed)
Pt ended HD early AMA at 80 d/t not feeling well. MD notified. 1L fluid removed and did not meet UF goal. Alert, vss, report to primary rn and ccmd notified of pt departure from hd unit. AVF positive for thrill and bruit post HD.

## 2021-08-19 NOTE — Progress Notes (Addendum)
[facility]  ROUNDING NOTE   Subjective:   GRIFFEY [name] is a 60 y.o. male with medical conditions including anemia, CHF, hypertension, and ESRD on dialysis. Patient has presented to ED with progressive back pain.   Patient states he has always had back pain but it has increased in the past few days. Denies known precipitating factors. Chest X-ray of lumbar spine shows multilevel degenerative changes and stenosis, greatest at L4-L5.   Patient is known to our practice and receives dialysis at [facility] supervised by Dr Juleen China. We have been consulted to manage dialysis treatments.    Patient was seen in the dialysis unit Patient was brought to the dialysis unit on the bed as patient refused to come to the ER in a chair because of his back pain   Objective:  Vital signs in last 24 hours:  Temp:  [97.4 F (36.3 C)-98.8 F (37.1 C)] 98.1 F (36.7 C) (09/10 1148) Pulse Rate:  [88-104] 92 (09/10 1148) Resp:  [14-28] 14 (09/10 1148) BP: (115-200)/(55-77) 126/66 (09/10 1148) SpO2:  [91 %-100 %] 100 % (09/10 1148)  Weight change:  Filed Weights   08/08/21 0625 08/16/21 0754  Weight: 91 kg 91 kg    Intake/Output: I/O last 3 completed shifts: In: -  Out: 1 [Stool:1]   Intake/Output this shift:  No intake/output data recorded.  Physical Exam: General: NAD, resting in bed  Head: Normocephalic, atraumatic. Moist oral mucosal membranes  Eyes: Anicteric  Lungs:  Clear to auscultation, normal effort  Heart: Regular rate and rhythm  Abdomen:  Soft, nontender, distended  Extremities: Mild right upper extremity swelling, no lower extremity edema  Neurologic: Alert, moving all four extremities  Skin: No lesions  Access: Rt AVG, swelling noted in right upper extremity    Basic Metabolic Panel: Recent Labs  Lab 08/15/21 0446 08/16/21 0429 08/17/21 0449 08/18/21 0416 08/19/21 0433  NA 136 136 135 136 138  K 4.0 4.2 3.9 4.7 4.8  CL 96* 96* 96* 95* 96*   CO2 29 28 30 27 28   GLUCOSE 102* 96 95 86 85  BUN 46* 58* 38* 50* 65*  CREATININE 7.01* 8.68* 7.01* 8.54* 9.93*  CALCIUM 10.1 10.9* 10.2 11.1* 10.8*  MG 1.9 1.9 1.9 2.1 2.3  PHOS  --  6.2*  --   --   --     Liver Function Tests: No results for input(s): AST, ALT, ALKPHOS, BILITOT, PROT, ALBUMIN in the last 168 hours.  No results for input(s): LIPASE, AMYLASE in the last 168 hours. No results for input(s): AMMONIA in the last 168 hours.  CBC: Recent Labs  Lab 08/15/21 0446 08/16/21 0429 08/17/21 0449 08/18/21 0416 08/19/21 0433  WBC 8.5 8.6 7.7 9.0 8.6  HGB 8.7* 8.8* 7.8* 8.0* 8.7*  HCT 27.3* 28.1* 24.5* 26.2* 27.3*  MCV 97.2 95.6 96.1 94.6 98.6  PLT 179 211 195 215 233    Cardiac Enzymes: No results for input(s): CKTOTAL, CKMB, CKMBINDEX, TROPONINI in the last 168 hours.  BNP: Invalid input(s): POCBNP  CBG: Recent Labs  Lab 08/14/21 0742 08/14/21 1639  GLUCAP 93 346*    Microbiology: Results for orders placed or performed during the hospital encounter of 08/08/21  Resp Panel by RT-PCR (Flu A&B, Covid) Nasopharyngeal Swab     Status: None   Collection Time: 08/08/21  1:22 PM   Specimen: Nasopharyngeal Swab; Nasopharyngeal(NP) swabs in vial transport medium  Result Value Ref Range Status   SARS Coronavirus 2 by RT  PCR NEGATIVE NEGATIVE Final    Comment: (NOTE) SARS-CoV-2 target nucleic acids are NOT DETECTED.  The SARS-CoV-2 RNA is generally detectable in upper respiratory specimens during the acute phase of infection. The lowest concentration of SARS-CoV-2 viral copies this assay can detect is 138 copies/mL. A negative result does not preclude SARS-Cov-2 infection and should not be used as the sole basis for treatment or other patient management decisions. A negative result may occur with  improper specimen collection/handling, submission of specimen other than nasopharyngeal swab, presence of viral mutation(s) within the areas targeted by this assay,  and inadequate number of viral copies(<138 copies/mL). A negative result must be combined with clinical observations, patient history, and epidemiological information. The expected result is Negative.  Fact Sheet for Patients:  EntrepreneurPulse.com.au  Fact Sheet for Healthcare Providers:  IncredibleEmployment.be  This test is no t yet approved or cleared by the [place] FDA and  has been authorized for detection and/or diagnosis of SARS-CoV-2 by FDA under an Emergency Use Authorization (EUA). This EUA will remain  in effect (meaning this test can be used) for the duration of the COVID-19 declaration under Section 564(b)(1) of the Act, 21 U.S.C.section 360bbb-3(b)(1), unless the authorization is terminated  or revoked sooner.       Influenza A by PCR NEGATIVE NEGATIVE Final   Influenza B by PCR NEGATIVE NEGATIVE Final    Comment: (NOTE) The Xpert Xpress SARS-CoV-2/FLU/RSV plus assay is intended as an aid in the diagnosis of influenza from Nasopharyngeal swab specimens and should not be used as a sole basis for treatment. Nasal washings and aspirates are unacceptable for Xpert Xpress SARS-CoV-2/FLU/RSV testing.  Fact Sheet for Patients: EntrepreneurPulse.com.au  Fact Sheet for Healthcare Providers: IncredibleEmployment.be  This test is not yet approved or cleared by the [place] FDA and has been authorized for detection and/or diagnosis of SARS-CoV-2 by FDA under an Emergency Use Authorization (EUA). This EUA will remain in effect (meaning this test can be used) for the duration of the COVID-19 declaration under Section 564(b)(1) of the Act, 21 U.S.C. section 360bbb-3(b)(1), unless the authorization is terminated or revoked.  Performed at [facility], [address] [place], [place] [address]   Culture, blood (Routine X 2) w Reflex to ID Panel     Status: Abnormal   Collection  Time: 08/11/21  5:47 PM   Specimen: BLOOD  Result Value Ref Range Status   Specimen Description   Final    BLOOD LEFT HAND Performed at [facility], [address] [place], [place] [address]    Special Requests   Final    BOTTLES DRAWN AEROBIC AND ANAEROBIC Blood Culture adequate volume Performed at [facility], [address] [place], [place] [address]    Culture  Setup Time   Final    GRAM POSITIVE COCCI IN CHAINS IN BOTH AEROBIC AND ANAEROBIC BOTTLES Gram Stain Report Called to,Read Back By and Verified With: JASON ROBBINS 08/12/21 @ 0507 BY SB Performed at [facility], [address] [place], [place] [address]    Culture (A)  Final    ENTEROCOCCUS FAECALIS SUSCEPTIBILITIES PERFORMED ON PREVIOUS CULTURE WITHIN THE LAST 5 DAYS. Performed at [facility], [place] [address], [place], [place] [address]    Report Status 08/14/2021 FINAL  Final  Culture, blood (Routine X 2) w Reflex to ID Panel     Status: Abnormal   Collection Time: 08/11/21  5:47 PM   Specimen: BLOOD  Result Value Ref  Range Status   Specimen Description   Final    BLOOD FOA Performed at [facility], [address] [place], [place] [address]    Special Requests   Final    BOTTLES DRAWN AEROBIC AND ANAEROBIC Blood Culture adequate volume Performed at [facility], [address] [place], [place] [address]    Culture  Setup Time   Final    GRAM POSITIVE COCCI IN CHAINS IN BOTH AEROBIC AND ANAEROBIC BOTTLES Gram Stain Report Called to,Read Back By and Verified With: JASON ROBBINS 08/12/21 @ 0507 BY SB Performed at [facility], [place] [address] [place], [place] [address]    Culture ENTEROCOCCUS FAECALIS (A)  Final   Report Status 08/14/2021 FINAL  Final   Organism ID, Bacteria ENTEROCOCCUS FAECALIS  Final      Susceptibility   Enterococcus faecalis - MIC*    AMPICILLIN <=2 SENSITIVE Sensitive     VANCOMYCIN 2 SENSITIVE Sensitive     GENTAMICIN  SYNERGY SENSITIVE Sensitive     * ENTEROCOCCUS FAECALIS  Blood Culture ID Panel (Reflexed)     Status: Abnormal   Collection Time: 08/11/21  5:47 PM  Result Value Ref Range Status   Enterococcus faecalis DETECTED (A) NOT DETECTED Final    Comment: CRITICAL RESULT CALLED TO, READ BACK BY AND VERIFIED WITH: JASON ROBBINS 08/12/21 @ 0507 BY SB    Enterococcus Faecium NOT DETECTED NOT DETECTED Final   Listeria monocytogenes NOT DETECTED NOT DETECTED Final   Staphylococcus species NOT DETECTED NOT DETECTED Final   Staphylococcus aureus (BCID) NOT DETECTED NOT DETECTED Final   Staphylococcus epidermidis NOT DETECTED NOT DETECTED Final   Staphylococcus lugdunensis NOT DETECTED NOT DETECTED Final   Streptococcus species NOT DETECTED NOT DETECTED Final   Streptococcus agalactiae NOT DETECTED NOT DETECTED Final   Streptococcus pneumoniae NOT DETECTED NOT DETECTED Final   Streptococcus pyogenes NOT DETECTED NOT DETECTED Final   A.calcoaceticus-baumannii NOT DETECTED NOT DETECTED Final   Bacteroides fragilis NOT DETECTED NOT DETECTED Final   Enterobacterales NOT DETECTED NOT DETECTED Final   Enterobacter cloacae complex NOT DETECTED NOT DETECTED Final   Escherichia coli NOT DETECTED NOT DETECTED Final   Klebsiella aerogenes NOT DETECTED NOT DETECTED Final   Klebsiella oxytoca NOT DETECTED NOT DETECTED Final   Klebsiella pneumoniae NOT DETECTED NOT DETECTED Final   Proteus species NOT DETECTED NOT DETECTED Final   Salmonella species NOT DETECTED NOT DETECTED Final   Serratia marcescens NOT DETECTED NOT DETECTED Final   Haemophilus influenzae NOT DETECTED NOT DETECTED Final   Neisseria meningitidis NOT DETECTED NOT DETECTED Final   Pseudomonas aeruginosa NOT DETECTED NOT DETECTED Final   Stenotrophomonas maltophilia NOT DETECTED NOT DETECTED Final   Candida albicans NOT DETECTED NOT DETECTED Final   Candida auris NOT DETECTED NOT DETECTED Final   Candida glabrata NOT DETECTED NOT DETECTED Final    Candida krusei NOT DETECTED NOT DETECTED Final   Candida parapsilosis NOT DETECTED NOT DETECTED Final   Candida tropicalis NOT DETECTED NOT DETECTED Final   Cryptococcus neoformans/gattii NOT DETECTED NOT DETECTED Final   Vancomycin resistance NOT DETECTED NOT DETECTED Final    Comment: Performed at [facility], [address] [place], [place] [address]  CULTURE, BLOOD (ROUTINE X 2) w Reflex to ID Panel     Status: None   Collection Time: 08/12/21 11:58 PM   Specimen: BLOOD  Result Value Ref Range Status   Specimen Description BLOOD BLOOD LEFT HAND  Final   Special Requests  Final    BOTTLES DRAWN AEROBIC AND ANAEROBIC Blood Culture adequate volume   Culture   Final    NO GROWTH 5 DAYS Performed at [facility], [address] [place], [place] [address]    Report Status 08/18/2021 FINAL  Final  CULTURE, BLOOD (ROUTINE X 2) w Reflex to ID Panel     Status: None   Collection Time: 08/12/21 11:58 PM   Specimen: BLOOD  Result Value Ref Range Status   Specimen Description BLOOD LEFT RADIAL  Final   Special Requests   Final    BOTTLES DRAWN AEROBIC AND ANAEROBIC Blood Culture results may not be optimal due to an inadequate volume of blood received in culture bottles   Culture   Final    NO GROWTH 5 DAYS Performed at [facility] [address], [place], [place] [address]    Report Status 08/18/2021 FINAL  Final    Coagulation Studies: No results for input(s): LABPROT, INR in the last 72 hours.  Urinalysis: No results for input(s): COLORURINE, LABSPEC, PHURINE, GLUCOSEU, HGBUR, BILIRUBINUR, KETONESUR, PROTEINUR, UROBILINOGEN, NITRITE, LEUKOCYTESUR in the last 72 hours.  Invalid input(s): APPERANCEUR    Imaging: IR Fluoro Guide CV Line Left  Result Date: 08/18/2021 INDICATION: Long-term antibiotics.  Poor IV access EXAM: ULTRASOUND AND FLUOROSCOPIC GUIDED PLACEMENT OF TUNNELED CENTRAL VENOUS CATHETER MEDICATIONS: 20 mL of Isovue-300 intravenous  contrast. ANESTHESIA/SEDATION: Versed 0 mg IV; Fentanyl 25 mcg IV; Sedation Time: 45 minutes. The patient was continuously monitored during the procedure by the interventional radiology nurse under my direct supervision. FLUOROSCOPY TIME:  8 minutes and 0 seconds (381.0 mGy) COMPLICATIONS: None immediate. PROCEDURE: Informed written consent was obtained from the patient after a discussion of the risks, benefits, and alternatives to treatment. Questions regarding the procedure were encouraged and answered. 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 The LEFT neck and chest were prepped with chlorhexidine in a sterile fashion, and a sterile drape was applied covering the operative field. Maximum barrier sterile technique with sterile gowns and gloves were used for the procedure. A timeout was performed prior to the initiation of the procedure. After the overlying soft tissues were anesthetized, a small venotomy incision was created and a micropuncture kit was utilized to access the LEFT internal jugular vein. Real-time ultrasound guidance was utilized for vascular access including the acquisition of a permanent ultrasound image documenting patency of the accessed vessel. The patient has an indwelling RIGHT upper extremity HERO graft. Multiple attempts at accessing the RIGHT atrium along the graft catheter using a Kumpe catheter and 0.035 inch glidewire were unsuccessful. Therefore a digital subtraction angiogram (DSA) of the central veins was obtained demonstrating central venous occlusion and dilated azygous vein. The decision was made to leave the catheter tip within the azygous. A microwire was utilized to measure appropriate catheter length. The micropuncture sheath was exchanged for a peel-away sheath over a guidewire. A 5 French single lumen tunneled central venous catheter measuring 25 cm was tunneled in a retrograde fashion from the anterior  chest wall to the venotomy incision. The catheter was then placed through the peel-away sheath with tip ultimately positioned at the SVC / azygous junction. Final catheter positioning was confirmed and documented with a spot radiographic image. The catheter aspirates and flushes normally. The catheter was flushed with appropriate volume heparin dwells. The catheter exit site was secured with a 2-0 nylon retention suture. The venotomy incision was closed with Dermabond. Dressings were  applied. The patient tolerated the procedure well without immediate post procedural complication. FINDINGS: 1. 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 2. Azygous vein appears to be the dominant upper extremity cephalic venous outflow (see key image). 3. New, 5 French tunneled central venous catheter tip intentionally placed at the SVC/azygous junction. IMPRESSION: 1. Successful placement of 5 French, 25 cm single lumen tunneled "Powerline" central venous catheter via the LEFT internal jugular vein, as above. 2. 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 3. New catheter tip intentionally positioned at the SVC/azygous junction, and may migrate into the azygous vein on mobilization. PLAN: The patient may benefit from central venous recanalization. Please place referral to vascular interventional radiology for evaluation. Michaelle Birks, MD [facility] Radiology Electronically Signed   By: Michaelle Birks M.D.   On: 08/18/2021 18:56   IR US Guidance  Result Date: 08/18/2021 INDICATION: Long-term antibiotics.  Poor IV access EXAM: ULTRASOUND AND FLUOROSCOPIC GUIDED PLACEMENT OF TUNNELED CENTRAL VENOUS CATHETER MEDICATIONS: 20 mL of Isovue-300 intravenous contrast. ANESTHESIA/SEDATION: Versed 0 mg IV; Fentanyl 25 mcg IV; Sedation Time: 45 minutes. The patient was  continuously monitored during the procedure by the interventional radiology nurse under my direct supervision. FLUOROSCOPY TIME:  8 minutes and 0 seconds (195.0 mGy) COMPLICATIONS: None immediate. PROCEDURE: Informed written consent was obtained from the patient after a discussion of the risks, benefits, and alternatives to treatment. Questions regarding the procedure were encouraged and answered. 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 The LEFT neck and chest were prepped with chlorhexidine in a sterile fashion, and a sterile drape was applied covering the operative field. Maximum barrier sterile technique with sterile gowns and gloves were used for the procedure. A timeout was performed prior to the initiation of the procedure. After the overlying soft tissues were anesthetized, a small venotomy incision was created and a micropuncture kit was utilized to access the LEFT internal jugular vein. Real-time ultrasound guidance was utilized for vascular access including the acquisition of a permanent ultrasound image documenting patency of the accessed vessel. The patient has an indwelling RIGHT upper extremity HERO graft. Multiple attempts at accessing the RIGHT atrium along the graft catheter using a Kumpe catheter and 0.035 inch glidewire were unsuccessful. Therefore a digital subtraction angiogram (DSA) of the central veins was obtained demonstrating central venous occlusion and dilated azygous vein. The decision was made to leave the catheter tip within the azygous. A microwire was utilized to measure appropriate catheter length. The micropuncture sheath was exchanged for a peel-away sheath over a guidewire. A 5 French single lumen tunneled central venous catheter measuring 25 cm was tunneled in a retrograde fashion from the anterior chest wall to the venotomy incision. The catheter was then placed through the peel-away sheath with tip  ultimately positioned at the SVC / azygous junction. Final catheter positioning was confirmed and documented with a spot radiographic image. The catheter aspirates and flushes normally. The catheter was flushed with appropriate volume heparin dwells. The catheter exit site was secured with a 2-0 nylon retention suture. The venotomy incision was closed with Dermabond. Dressings were applied. The patient tolerated the procedure well without immediate post procedural complication. FINDINGS: 1. 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 2. Azygous vein appears to be the dominant upper extremity cephalic  venous outflow (see key image). 3. New, 5 French tunneled central venous catheter tip intentionally placed at the SVC/azygous junction. IMPRESSION: 1. Successful placement of 5 French, 25 cm single lumen tunneled "Powerline" central venous catheter via the LEFT internal jugular vein, as above. 2. 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 3. New catheter tip intentionally positioned at the SVC/azygous junction, and may migrate into the azygous vein on mobilization. PLAN: The patient may benefit from central venous recanalization. Please place referral to vascular interventional radiology for evaluation. Michaelle Birks, MD [facility] Radiology Electronically Signed   By: Michaelle Birks M.D.   On: 08/18/2021 18:56     Medications:    ampicillin (OMNIPEN) IV 2 g (08/19/21 0530)   cefTRIAXone (ROCEPHIN)  IV 2 g (08/19/21 0945)     apixaban  2.5 mg Oral BID   Chlorhexidine Gluconate Cloth  6 each Topical Q0600   epoetin (EPOGEN/PROCRIT) injection  4,000 Units Intravenous Q M,W,F-HD   feeding supplement (NEPRO CARB STEADY)  237 mL Oral TID BM   lidocaine  2 patch Transdermal Q24H   nicotine  21 mg Transdermal Daily   senna-docusate  1  tablet Oral BID   sevelamer carbonate  1,600 mg Oral TID WC   acetaminophen, bisacodyl, fentaNYL, heparin lock flush, hydrALAZINE, lidocaine-EPINEPHrine, LORazepam, Muscle Rub, ondansetron (ZOFRAN) IV, oxyCODONE-acetaminophen, polyethylene glycol  Assessment/ Plan:  [name] is a 60 y.o.  male with medical conditions including anemia, CHF, hypertension, and ESRD on dialysis. Patient has presented to ED with progressive back pain.  [facility]/MWF/Rt AVG    Impression  1)Renal   End-stage renal disease Patient is on hemodialysis Patient is a Monday Wednesday Friday schedule. Patient is to be dialyzed today.   2)HTN    Blood pressure is stable    3)Anemia of chronic disease  CBC Latest Ref Rng & Units 08/19/2021 08/18/2021 08/17/2021  WBC 4.0 - 10.5 K/uL 8.6 9.0 7.7  Hemoglobin 13.0 - 17.0 g/dL 8.7(L) 8.0(L) 7.8(L)  Hematocrit 39.0 - 52.0 % 27.3(L) 26.2(L) 24.5(L)  Platelets 150 - 400 K/uL 233 215 195     Patient hemoglobin is not at goal Patient is on Epogen Patient hemoglobin levels are improving slowly   4) Secondary hyperparathyroidism -CKD Mineral-Bone Disorder    Lab Results  Component Value Date   PTH 583.2 (H) 12/11/2012   CALCIUM 10.8 (H) 08/19/2021   CAION 1.27 (H) 12/26/2012   PHOS 6.2 (H) 08/16/2021    Secondary hyperparathyroidism-present Hypercalcemia Patient has high calcium level most likely secondary to vertebral lesions as well as patient is on calcium based binders Will DC PhosLo We will start patient on known calcium based binders  Phosphorus is high Changing patient's binders We will continue educate patient about compliance with low phosphorus diet   5)Sepsis Patient has aortic valve endocarditis Patient has enterococcal bacteremia Patient has L4/L5 discitis Patient also has psoas phlegmon Patient is currently on IV antibiotics Plan is to keep patient on 6 weeks of antibiotics as recommended by ID  Patient is  to remain on ampicillin 2 g IV every 12 hours Ceftriaxone 2 g IV every 12 hours till September 23, 2021  6) Electrolytes   BMP Latest Ref Rng & Units 08/19/2021 08/18/2021 08/17/2021  Glucose 70 - 99 mg/dL 85 86 95  BUN 6 - 20 mg/dL 65(H) 50(H) 38(H)  Creatinine 0.61 - 1.24 mg/dL 9.93(H) 8.54(H) 7.01(H)  Sodium 135 -  145 mmol/L 138 136 135  Potassium 3.5 - 5.1 mmol/L 4.8 4.7 3.9  Chloride 98 - 111 mmol/L 96(L) 95(L) 96(L)  CO2 22 - 32 mmol/L 28 27 30   Calcium 8.9 - 10.3 mg/dL 10.8(H) 11.1(H) 10.2     Sodium Normonatremic   Potassium Normokalemic    7)Acid base    Co2 at goal    Plan   Patient is being dialyzed today We will keep patient on Epogen Will change patient's binders in an effort to help with hypercalcemia and hyperphosphatemia.    LOS: 10 Shakeema Lippman s [facility] 9/10/20221:24 PM

## 2021-08-20 DIAGNOSIS — I82C19 Acute embolism and thrombosis of unspecified internal jugular vein: Secondary | ICD-10-CM | POA: Diagnosis not present

## 2021-08-20 DIAGNOSIS — M5440 Lumbago with sciatica, unspecified side: Secondary | ICD-10-CM | POA: Diagnosis not present

## 2021-08-20 DIAGNOSIS — N186 End stage renal disease: Secondary | ICD-10-CM | POA: Diagnosis not present

## 2021-08-20 DIAGNOSIS — I358 Other nonrheumatic aortic valve disorders: Secondary | ICD-10-CM | POA: Diagnosis not present

## 2021-08-20 MED ORDER — SODIUM CHLORIDE 0.9 % IV SOLN
INTRAVENOUS | Status: DC | PRN
  Administered 2021-08-20 – 2021-08-21 (×2): 500 mL via INTRAVENOUS
  Administered 2021-08-27: 250 mL via INTRAVENOUS

## 2021-08-20 MED ORDER — METHOCARBAMOL 500 MG PO TABS
500.0000 mg | ORAL_TABLET | Freq: Four times a day (QID) | ORAL | Status: DC | PRN
  Administered 2021-08-20 – 2021-08-29 (×7): 500 mg via ORAL
  Filled 2021-08-20 (×9): qty 1

## 2021-08-20 NOTE — Progress Notes (Signed)
Patient ID: [name] [name], male   DOB: 1961-07-05, 60 y.o.   MRN: [id number]  PROGRESS NOTE    [name] [name]  KCL:[id number] DOB: 11-16-1961 DOA: 08/08/2021 PCP: Pcp, No   Brief Narrative:  60 y.o. male with medical history significant of ESRD-HD, hypertension, tobacco abuse, polysubstance abuse, diverticulitis, anemia, jugular vein thrombosis, GI bleeding, right pneumothorax presented with worsening lower back pain.  He was found to have sepsis secondary to Enterococcus faecalis bacteremia.  ID was consulted and he has been on ampicillin and ceftriaxone.  He was found to have possible developing osteomyelitis in C5-C6 with possible early left psoas abscess formation and possible early discitis in L4-L5.  Nephrology was also consulted for continuation of dialysis.  TEE showed aortic valve endocarditis.  PT recommended SNF placement.  He is currently medically stable for discharge.  Assessment & Plan:   Sepsis: Possibly evolving on admission Enterococcus faecalis bacteremia Aortic valve endocarditis -ID following.  Currently on Rocephin and ampicillin. -TTE negative for vegetations. 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  ID is recommending 6 to weeks of ampicillin and Rocephin till 09/23/2021 followed by p.o. amoxicillin for a few weeks.  -Status post tunneled catheter placement on 08/18/2021 by IR -Blood cultures from 08/11/2021 grew E faecalis.  Blood cultures from 08/12/2021 have been negative so far. -Sepsis is resolved.  Currently hemodynamically stable with no temperature spikes.  Severe lower back pain Possible developing osteomyelitis in C5-C6 Left psoas muscle infection with superimposed 1.3 cm phlegmon/early abscess formation Possible early discitis in L4-L5 -Continue antibiotics as above.  Continue pain management and bowel regimen  End-stage renal disease on hemodialysis -Nephrology following.  Dialysis as per nephrology schedule  Anemia of chronic disease -From renal  failure.  Hemoglobin stable  Polysubstance abuse and tobacco abuse -Patient was counseled regarding cessation by prior hospitalist.  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 Keturah Barre recommendations.  Continue Eliquis  Hypertension Hypotension -Blood pressure currently stable.  Off amlodipine  Chronic combined CHF -LVEF of 30 to 35% as per TTE on 08/13/2021.  Volume managed by dialysis.  Strict input output.  Daily weights.  Fluid restriction  Intermittent delirium -Monitor mental status.   -Currently mental status stable.  Bedside sitter has been discontinued.  Fall precautions.  Generalized conditioning -PT/OT recommend SNF placement.  Social worker following.  DVT prophylaxis: Eliquis Code Status: Full Family Communication: None at bedside Disposition Plan: Status is: Inpatient  Remains inpatient appropriate because:Inpatient level of care appropriate due to severity of illness  Dispo: The patient is from: Home              Anticipated d/c is to: SNF              Patient currently is medically stable to d/c.    Difficult to place patient No  Consultants: ID/cardiology/nephrology  Procedures: TTE/TEE Tunneled catheter placement by IR on 08/18/2021  Antimicrobials:  Anti-infectives (From admission, onward)    Start     Dose/Rate Route Frequency Ordered Stop   08/14/21 1530  cefTRIAXone (ROCEPHIN) 2 g in sodium chloride 0.9 % 100 mL IVPB        2 g 200 mL/hr over 30 Minutes Intravenous Every 12 hours 08/14/21 1443     08/14/21 1200  vancomycin (VANCOCIN) IVPB 1000 mg/200 mL premix  Status:  Discontinued        1,000  mg 200 mL/hr over 60 Minutes Intravenous Every M-W-F (Hemodialysis) 08/11/21 1831 08/12/21 0517   08/12/21 2200  cefTRIAXone (ROCEPHIN) 2 g in sodium chloride 0.9 % 100 mL IVPB  Status:  Discontinued        2 g 200 mL/hr over 30 Minutes  Intravenous Every 12 hours 08/12/21 1923 08/14/21 1443   08/12/21 0615  ampicillin (OMNIPEN) 2 g in sodium chloride 0.9 % 100 mL IVPB        2 g 300 mL/hr over 20 Minutes Intravenous Every 12 hours 08/12/21 0520     08/11/21 1930  vancomycin (VANCOREADY) IVPB 2000 mg/400 mL        2,000 mg 200 mL/hr over 120 Minutes Intravenous  Once 08/11/21 1831 08/12/21 0156   08/11/21 1900  cefTRIAXone (ROCEPHIN) 2 g in sodium chloride 0.9 % 100 mL IVPB  Status:  Discontinued        2 g 200 mL/hr over 30 Minutes Intravenous Every 12 hours 08/11/21 1829 08/12/21 0517        Subjective: Patient seen and examined at bedside.  Poor historian.  No overnight vomiting, chest pain, fever reported.  As per nursing staff, patient refused to complete full session of hemodialysis on 08/19/2021. Objective: Vitals:   08/19/21 2347 08/20/21 0451 08/20/21 0555 08/20/21 0727  BP: 127/68 (!) 123/58 127/77 123/61  Pulse: (!) 103 97 (!) 105 99  Resp: 20 18 19 16   Temp: 98.3 F (36.8 C) 98.4 F (36.9 C) 98 F (36.7 C) 98.6 F (37 C)  TempSrc: Oral Oral    SpO2: 98% 99% 100% 90%  Weight:      Height:        Intake/Output Summary (Last 24 hours) at 08/20/2021 0753 Last data filed at 08/19/2021 1753 Gross per 24 hour  Intake 220 ml  Output 1024 ml  Net -804 ml    Filed Weights   08/08/21 0625 08/16/21 0754  Weight: 91 kg 91 kg    Examination:  General exam: Chronically ill looking and deconditioned.  Currently on room air.  No acute distress.   Respiratory system: Decreased breath sounds at bases bilaterally with scattered crackles  cardiovascular system: Intermittent tachycardia present; S1-S2 heard gastrointestinal system: Abdomen is slightly distended, soft and nontender.  Normal bowel sounds heard  extremities: No cyanosis; mild lower extremity edema present Central nervous system: Still very slow to respond.  Poor historian.  No focal neurological deficits.  Moving extremities skin: No obvious  petechiae/rashes psychiatry: Flat affect.  Does not participate in conversation much.  Data Reviewed: I have personally reviewed following labs and imaging studies  CBC: Recent Labs  Lab 08/15/21 0446 08/16/21 0429 08/17/21 0449 08/18/21 0416 08/19/21 0433  WBC 8.5 8.6 7.7 9.0 8.6  HGB 8.7* 8.8* 7.8* 8.0* 8.7*  HCT 27.3* 28.1* 24.5* 26.2* 27.3*  MCV 97.2 95.6 96.1 94.6 98.6  PLT 179 211 195 215 867    Basic Metabolic Panel: Recent Labs  Lab 08/15/21 0446 08/16/21 0429 08/17/21 0449 08/18/21 0416 08/19/21 0433  NA 136 136 135 136 138  K 4.0 4.2 3.9 4.7 4.8  CL 96* 96* 96* 95* 96*  CO2 29 28 30 27 28   GLUCOSE 102* 96 95 86 85  BUN 46* 58* 38* 50* 65*  CREATININE 7.01* 8.68* 7.01* 8.54* 9.93*  CALCIUM 10.1 10.9* 10.2 11.1* 10.8*  MG 1.9 1.9 1.9 2.1 2.3  PHOS  --  6.2*  --   --   --  GFR: Estimated Creatinine Clearance: 9.3 mL/min (A) (by C-G formula based on SCr of 9.93 mg/dL (H)). Liver Function Tests: No results for input(s): AST, ALT, ALKPHOS, BILITOT, PROT, ALBUMIN in the last 168 hours. No results for input(s): LIPASE, AMYLASE in the last 168 hours. No results for input(s): AMMONIA in the last 168 hours. Coagulation Profile: No results for input(s): INR, PROTIME in the last 168 hours. Cardiac Enzymes: No results for input(s): CKTOTAL, CKMB, CKMBINDEX, TROPONINI in the last 168 hours. BNP (last 3 results) No results for input(s): PROBNP in the last 8760 hours. HbA1C: No results for input(s): HGBA1C in the last 72 hours. CBG: Recent Labs  Lab 08/14/21 0742 08/14/21 1639  GLUCAP 93 346*    Lipid Profile: No results for input(s): CHOL, HDL, LDLCALC, TRIG, CHOLHDL, LDLDIRECT in the last 72 hours. Thyroid Function Tests: No results for input(s): TSH, T4TOTAL, FREET4, T3FREE, THYROIDAB in the last 72 hours. Anemia Panel: No results for input(s): VITAMINB12, FOLATE, FERRITIN, TIBC, IRON, RETICCTPCT in the last 72 hours. Sepsis Labs: Recent Labs  Lab  08/13/21 0827  PROCALCITON 4.61     Recent Results (from the past 240 hour(s))  Culture, blood (Routine X 2) w Reflex to ID Panel     Status: Abnormal   Collection Time: 08/11/21  5:47 PM   Specimen: BLOOD  Result Value Ref Range Status   Specimen Description   Final    BLOOD LEFT HAND Performed at [facility], [address], [place], [place] [address]    Special Requests   Final    BOTTLES DRAWN AEROBIC AND ANAEROBIC Blood Culture adequate volume Performed at [facility], [address] [place], [place] [address]    Culture  Setup Time   Final    GRAM POSITIVE COCCI IN CHAINS IN BOTH AEROBIC AND ANAEROBIC BOTTLES Gram Stain Report Called to,Read Back By and Verified With: JASON ROBBINS 08/12/21 @ 0507 BY SB Performed at [facility], [address] [place], [place] [address]    Culture (A)  Final    ENTEROCOCCUS FAECALIS SUSCEPTIBILITIES PERFORMED ON PREVIOUS CULTURE WITHIN THE LAST 5 DAYS. Performed at [facility], [place] [address], [place], [place] [address]    Report Status 08/14/2021 FINAL  Final  Culture, blood (Routine X 2) w Reflex to ID Panel     Status: Abnormal   Collection Time: 08/11/21  5:47 PM   Specimen: BLOOD  Result Value Ref Range Status   Specimen Description   Final    BLOOD FOA Performed at [facility], [address] [place], [place] [address]    Special Requests   Final    BOTTLES DRAWN AEROBIC AND ANAEROBIC Blood Culture adequate volume Performed at [facility], [address] [place], [place] [address]    Culture  Setup Time   Final    GRAM POSITIVE COCCI IN CHAINS IN BOTH AEROBIC AND ANAEROBIC BOTTLES Gram Stain Report Called to,Read Back By and Verified With: JASON ROBBINS 08/12/21 @ 0507 BY SB Performed at [facility], [place] [address] [place], [place] [address]    Culture ENTEROCOCCUS FAECALIS (A)  Final   Report Status 08/14/2021 FINAL  Final   Organism ID, Bacteria  ENTEROCOCCUS FAECALIS  Final      Susceptibility   Enterococcus faecalis - MIC*    AMPICILLIN <=2 SENSITIVE Sensitive     VANCOMYCIN 2 SENSITIVE Sensitive     GENTAMICIN SYNERGY SENSITIVE Sensitive     *  ENTEROCOCCUS FAECALIS  Blood Culture ID Panel (Reflexed)     Status: Abnormal   Collection Time: 08/11/21  5:47 PM  Result Value Ref Range Status   Enterococcus faecalis DETECTED (A) NOT DETECTED Final    Comment: CRITICAL RESULT CALLED TO, READ BACK BY AND VERIFIED WITH: JASON ROBBINS 08/12/21 @ 0507 BY SB    Enterococcus Faecium NOT DETECTED NOT DETECTED Final   Listeria monocytogenes NOT DETECTED NOT DETECTED Final   Staphylococcus species NOT DETECTED NOT DETECTED Final   Staphylococcus aureus (BCID) NOT DETECTED NOT DETECTED Final   Staphylococcus epidermidis NOT DETECTED NOT DETECTED Final   Staphylococcus lugdunensis NOT DETECTED NOT DETECTED Final   Streptococcus species NOT DETECTED NOT DETECTED Final   Streptococcus agalactiae NOT DETECTED NOT DETECTED Final   Streptococcus pneumoniae NOT DETECTED NOT DETECTED Final   Streptococcus pyogenes NOT DETECTED NOT DETECTED Final   A.calcoaceticus-baumannii NOT DETECTED NOT DETECTED Final   Bacteroides fragilis NOT DETECTED NOT DETECTED Final   Enterobacterales NOT DETECTED NOT DETECTED Final   Enterobacter cloacae complex NOT DETECTED NOT DETECTED Final   Escherichia coli NOT DETECTED NOT DETECTED Final   Klebsiella aerogenes NOT DETECTED NOT DETECTED Final   Klebsiella oxytoca NOT DETECTED NOT DETECTED Final   Klebsiella pneumoniae NOT DETECTED NOT DETECTED Final   Proteus species NOT DETECTED NOT DETECTED Final   Salmonella species NOT DETECTED NOT DETECTED Final   Serratia marcescens NOT DETECTED NOT DETECTED Final   Haemophilus influenzae NOT DETECTED NOT DETECTED Final   Neisseria meningitidis NOT DETECTED NOT DETECTED Final   Pseudomonas aeruginosa NOT DETECTED NOT DETECTED Final   Stenotrophomonas maltophilia NOT  DETECTED NOT DETECTED Final   Candida albicans NOT DETECTED NOT DETECTED Final   Candida auris NOT DETECTED NOT DETECTED Final   Candida glabrata NOT DETECTED NOT DETECTED Final   Candida krusei NOT DETECTED NOT DETECTED Final   Candida parapsilosis NOT DETECTED NOT DETECTED Final   Candida tropicalis NOT DETECTED NOT DETECTED Final   Cryptococcus neoformans/gattii NOT DETECTED NOT DETECTED Final   Vancomycin resistance NOT DETECTED NOT DETECTED Final    Comment: Performed at [facility], [address] [place], [place] [address]  CULTURE, BLOOD (ROUTINE X 2) w Reflex to ID Panel     Status: None   Collection Time: 08/12/21 11:58 PM   Specimen: BLOOD  Result Value Ref Range Status   Specimen Description BLOOD BLOOD LEFT HAND  Final   Special Requests   Final    BOTTLES DRAWN AEROBIC AND ANAEROBIC Blood Culture adequate volume   Culture   Final    NO GROWTH 5 DAYS Performed at [facility], [address] [place], [place] [address]    Report Status 08/18/2021 FINAL  Final  CULTURE, BLOOD (ROUTINE X 2) w Reflex to ID Panel     Status: None   Collection Time: 08/12/21 11:58 PM   Specimen: BLOOD  Result Value Ref Range Status   Specimen Description BLOOD LEFT RADIAL  Final   Special Requests   Final    BOTTLES DRAWN AEROBIC AND ANAEROBIC Blood Culture results may not be optimal due to an inadequate volume of blood received in culture bottles   Culture   Final    NO GROWTH 5 DAYS Performed at [facility], [address], [place], [place] [address]    Report Status 08/18/2021 FINAL  Final          Radiology Studies: IR Fluoro Guide CV Line Left  Result Date:  08/18/2021 INDICATION: Long-term antibiotics.  Poor IV access EXAM: ULTRASOUND AND FLUOROSCOPIC GUIDED PLACEMENT OF TUNNELED CENTRAL VENOUS CATHETER MEDICATIONS: 20 mL of Isovue-300 intravenous contrast. ANESTHESIA/SEDATION: Versed 0 mg IV; Fentanyl 25 mcg IV; Sedation Time: 45 minutes. The  patient was continuously monitored during the procedure by the interventional radiology nurse under my direct supervision. FLUOROSCOPY TIME:  8 minutes and 0 seconds (294.7 mGy) COMPLICATIONS: None immediate. PROCEDURE: Informed written consent was obtained from the patient after a discussion of the risks, benefits, and alternatives to treatment. Questions regarding the procedure were encouraged and answered. 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 The LEFT neck and chest were prepped with chlorhexidine in a sterile fashion, and a sterile drape was applied covering the operative field. Maximum barrier sterile technique with sterile gowns and gloves were used for the procedure. A timeout was performed prior to the initiation of the procedure. After the overlying soft tissues were anesthetized, a small venotomy incision was created and a micropuncture kit was utilized to access the LEFT internal jugular vein. Real-time ultrasound guidance was utilized for vascular access including the acquisition of a permanent ultrasound image documenting patency of the accessed vessel. The patient has an indwelling RIGHT upper extremity HERO graft. Multiple attempts at accessing the RIGHT atrium along the graft catheter using a Kumpe catheter and 0.035 inch glidewire were unsuccessful. Therefore a digital subtraction angiogram (DSA) of the central veins was obtained demonstrating central venous occlusion and dilated azygous vein. The decision was made to leave the catheter tip within the azygous. A microwire was utilized to measure appropriate catheter length. The micropuncture sheath was exchanged for a peel-away sheath over a guidewire. A 5 French single lumen tunneled central venous catheter measuring 25 cm was tunneled in a retrograde fashion from the anterior chest wall to the venotomy incision. The catheter was then placed through the peel-away sheath with  tip ultimately positioned at the SVC / azygous junction. Final catheter positioning was confirmed and documented with a spot radiographic image. The catheter aspirates and flushes normally. The catheter was flushed with appropriate volume heparin dwells. The catheter exit site was secured with a 2-0 nylon retention suture. The venotomy incision was closed with Dermabond. Dressings were applied. The patient tolerated the procedure well without immediate post procedural complication. FINDINGS: 1. 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 2. Azygous vein appears to be the dominant upper extremity cephalic venous outflow (see key image). 3. New, 5 French tunneled central venous catheter tip intentionally placed at the SVC/azygous junction. IMPRESSION: 1. Successful placement of 5 French, 25 cm single lumen tunneled "Powerline" central venous catheter via the LEFT internal jugular vein, as above. 2. 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 3. New catheter tip intentionally positioned at the SVC/azygous junction, and may migrate into the azygous vein on mobilization. PLAN: The patient may benefit from central venous recanalization. Please place referral to vascular interventional radiology for evaluation. Michaelle Birks, MD [facility] Radiology Electronically Signed   By: Michaelle Birks M.D.   On: 08/18/2021 18:56   IR US Guidance  Result Date: 08/18/2021 INDICATION: Long-term antibiotics.  Poor IV access EXAM: ULTRASOUND AND FLUOROSCOPIC GUIDED PLACEMENT OF TUNNELED CENTRAL VENOUS CATHETER MEDICATIONS: 20 mL of Isovue-300 intravenous contrast. ANESTHESIA/SEDATION: Versed 0 mg IV; Fentanyl 25 mcg IV; Sedation Time: 45 minutes. The patient was continuously  monitored during the procedure by the interventional radiology nurse under my direct  supervision. FLUOROSCOPY TIME:  8 minutes and 0 seconds (882.8 mGy) COMPLICATIONS: None immediate. PROCEDURE: Informed written consent was obtained from the patient after a discussion of the risks, benefits, and alternatives to treatment. Questions regarding the procedure were encouraged and answered. 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 The LEFT neck and chest were prepped with chlorhexidine in a sterile fashion, and a sterile drape was applied covering the operative field. Maximum barrier sterile technique with sterile gowns and gloves were used for the procedure. A timeout was performed prior to the initiation of the procedure. After the overlying soft tissues were anesthetized, a small venotomy incision was created and a micropuncture kit was utilized to access the LEFT internal jugular vein. Real-time ultrasound guidance was utilized for vascular access including the acquisition of a permanent ultrasound image documenting patency of the accessed vessel. The patient has an indwelling RIGHT upper extremity HERO graft. Multiple attempts at accessing the RIGHT atrium along the graft catheter using a Kumpe catheter and 0.035 inch glidewire were unsuccessful. Therefore a digital subtraction angiogram (DSA) of the central veins was obtained demonstrating central venous occlusion and dilated azygous vein. The decision was made to leave the catheter tip within the azygous. A microwire was utilized to measure appropriate catheter length. The micropuncture sheath was exchanged for a peel-away sheath over a guidewire. A 5 French single lumen tunneled central venous catheter measuring 25 cm was tunneled in a retrograde fashion from the anterior chest wall to the venotomy incision. The catheter was then placed through the peel-away sheath with tip ultimately positioned at the SVC / azygous junction. Final catheter positioning was confirmed and  documented with a spot radiographic image. The catheter aspirates and flushes normally. The catheter was flushed with appropriate volume heparin dwells. The catheter exit site was secured with a 2-0 nylon retention suture. The venotomy incision was closed with Dermabond. Dressings were applied. The patient tolerated the procedure well without immediate post procedural complication. FINDINGS: 1. 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 2. Azygous vein appears to be the dominant upper extremity cephalic venous outflow (see key image). 3. New, 5 French tunneled central venous catheter tip intentionally placed at the SVC/azygous junction. IMPRESSION: 1. Successful placement of 5 French, 25 cm single lumen tunneled "Powerline" central venous catheter via the LEFT internal jugular vein, as above. 2. 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 3. New catheter tip intentionally positioned at the SVC/azygous junction, and may migrate into the azygous vein on mobilization. PLAN: The patient may benefit from central venous recanalization. Please place referral to vascular interventional radiology for evaluation. Michaelle Birks, MD [facility] Radiology Electronically Signed   By: Michaelle Birks M.D.   On: 08/18/2021 18:56        Scheduled Meds:  apixaban  2.5 mg Oral BID   Chlorhexidine Gluconate Cloth  6 each Topical Q0600   epoetin (EPOGEN/PROCRIT) injection  4,000 Units Intravenous Q M,W,F-HD   feeding supplement (NEPRO CARB STEADY)  237 mL Oral TID BM   lidocaine  2 patch Transdermal Q24H   nicotine  21 mg Transdermal Daily   senna-docusate  1 tablet Oral BID   sevelamer carbonate  1,600 mg Oral TID WC   Continuous Infusions:  ampicillin (OMNIPEN) IV 2 g (  08/20/21 5790)   cefTRIAXone (ROCEPHIN)  IV 2 g (08/19/21 2149)           Aline August, MD [facility] 08/20/2021, 7:53 AM

## 2021-08-20 NOTE — Progress Notes (Signed)
[facility]  ROUNDING NOTE   Subjective:   CHAPIN ARDUINI is a 60 y.o. male with medical conditions including anemia, CHF, hypertension, and ESRD on dialysis. Patient has presented to ED with progressive back pain.   Patient states he has always had back pain but it has increased in the past few days. Denies known precipitating factors. Chest X-ray of lumbar spine shows multilevel degenerative changes and stenosis, greatest at L4-L5.   Patient is known to our practice and receives dialysis at [facility] supervised by Dr Juleen China. We have been consulted to manage dialysis treatments.   Patient was seen today on first floor Patient offers no new specific physical complaints Patient's main complaint in today visit was that he was missing to see Dr. Juleen China   Objective:  Vital signs in last 24 hours:  Temp:  [97.8 F (36.6 C)-98.9 F (37.2 C)] 98.6 F (37 C) (09/11 0727) Pulse Rate:  [88-105] 99 (09/11 0727) Resp:  [10-26] 16 (09/11 0727) BP: (115-127)/(58-79) 123/61 (09/11 0727) SpO2:  [90 %-100 %] 90 % (09/11 0727)  Weight change:  Filed Weights   08/08/21 0625 08/16/21 0754  Weight: 91 kg 91 kg    Intake/Output: I/O last 3 completed shifts: In: 1120 [P.O.:120; IV Piggyback:1000] Out: 1025 [Other:1024; Stool:1]   Intake/Output this shift:  No intake/output data recorded.  Physical Exam: General: NAD, resting in bed  Head: Normocephalic, atraumatic. Moist oral mucosal membranes  Eyes: Anicteric  Lungs:  Clear to auscultation, normal effort  Heart: Regular rate and rhythm  Abdomen:  Soft, nontender, distended  Extremities: Mild right upper extremity swelling, no lower extremity edema  Neurologic: Alert, moving all four extremities  Skin: No lesions  Access: Rt AVG,     Basic Metabolic Panel: Recent Labs  Lab 08/15/21 0446 08/16/21 0429 08/17/21 0449 08/18/21 0416 08/19/21 0433  NA 136 136 135 136 138  K 4.0 4.2 3.9 4.7 4.8  CL 96* 96* 96*  95* 96*  CO2 29 28 30 27 28   GLUCOSE 102* 96 95 86 85  BUN 46* 58* 38* 50* 65*  CREATININE 7.01* 8.68* 7.01* 8.54* 9.93*  CALCIUM 10.1 10.9* 10.2 11.1* 10.8*  MG 1.9 1.9 1.9 2.1 2.3  PHOS  --  6.2*  --   --   --     Liver Function Tests: No results for input(s): AST, ALT, ALKPHOS, BILITOT, PROT, ALBUMIN in the last 168 hours.  No results for input(s): LIPASE, AMYLASE in the last 168 hours. No results for input(s): AMMONIA in the last 168 hours.  CBC: Recent Labs  Lab 08/15/21 0446 08/16/21 0429 08/17/21 0449 08/18/21 0416 08/19/21 0433  WBC 8.5 8.6 7.7 9.0 8.6  HGB 8.7* 8.8* 7.8* 8.0* 8.7*  HCT 27.3* 28.1* 24.5* 26.2* 27.3*  MCV 97.2 95.6 96.1 94.6 98.6  PLT 179 211 195 215 233    Cardiac Enzymes: No results for input(s): CKTOTAL, CKMB, CKMBINDEX, TROPONINI in the last 168 hours.  BNP: Invalid input(s): POCBNP  CBG: Recent Labs  Lab 08/14/21 0742 08/14/21 1639  GLUCAP 93 346*    Microbiology: Results for orders placed or performed during the hospital encounter of 08/08/21  Resp Panel by RT-PCR (Flu A&B, Covid) Nasopharyngeal Swab     Status: None   Collection Time: 08/08/21  1:22 PM   Specimen: Nasopharyngeal Swab; Nasopharyngeal(NP) swabs in vial transport medium  Result Value Ref Range Status   SARS Coronavirus 2 by RT PCR NEGATIVE NEGATIVE Final  Comment: (NOTE) SARS-CoV-2 target nucleic acids are NOT DETECTED.  The SARS-CoV-2 RNA is generally detectable in upper respiratory specimens during the acute phase of infection. The lowest concentration of SARS-CoV-2 viral copies this assay can detect is 138 copies/mL. A negative result does not preclude SARS-Cov-2 infection and should not be used as the sole basis for treatment or other patient management decisions. A negative result may occur with  improper specimen collection/handling, submission of specimen other than nasopharyngeal swab, presence of viral mutation(s) within the areas targeted by this  assay, and inadequate number of viral copies(<138 copies/mL). A negative result must be combined with clinical observations, patient history, and epidemiological information. The expected result is Negative.  Fact Sheet for Patients:  EntrepreneurPulse.com.au  Fact Sheet for Healthcare Providers:  IncredibleEmployment.be  This test is no t yet approved or cleared by the [place] FDA and  has been authorized for detection and/or diagnosis of SARS-CoV-2 by FDA under an Emergency Use Authorization (EUA). This EUA will remain  in effect (meaning this test can be used) for the duration of the COVID-19 declaration under Section 564(b)(1) of the Act, 21 U.S.C.section 360bbb-3(b)(1), unless the authorization is terminated  or revoked sooner.       Influenza A by PCR NEGATIVE NEGATIVE Final   Influenza B by PCR NEGATIVE NEGATIVE Final    Comment: (NOTE) The Xpert Xpress SARS-CoV-2/FLU/RSV plus assay is intended as an aid in the diagnosis of influenza from Nasopharyngeal swab specimens and should not be used as a sole basis for treatment. Nasal washings and aspirates are unacceptable for Xpert Xpress SARS-CoV-2/FLU/RSV testing.  Fact Sheet for Patients: EntrepreneurPulse.com.au  Fact Sheet for Healthcare Providers: IncredibleEmployment.be  This test is not yet approved or cleared by the [place] FDA and has been authorized for detection and/or diagnosis of SARS-CoV-2 by FDA under an Emergency Use Authorization (EUA). This EUA will remain in effect (meaning this test can be used) for the duration of the COVID-19 declaration under Section 564(b)(1) of the Act, 21 U.S.C. section 360bbb-3(b)(1), unless the authorization is terminated or revoked.  Performed at [facility], [address] [place], [place] [address]   Culture, blood (Routine X 2) w Reflex to ID Panel     Status: Abnormal    Collection Time: 08/11/21  5:47 PM   Specimen: BLOOD  Result Value Ref Range Status   Specimen Description   Final    BLOOD LEFT HAND Performed at [facility], [address] [place], [place] [address]    Special Requests   Final    BOTTLES DRAWN AEROBIC AND ANAEROBIC Blood Culture adequate volume Performed at [facility], [address] [place], [place] [address]    Culture  Setup Time   Final    GRAM POSITIVE COCCI IN CHAINS IN BOTH AEROBIC AND ANAEROBIC BOTTLES Gram Stain Report Called to,Read Back By and Verified With: JASON ROBBINS 08/12/21 @ 0507 BY SB Performed at [facility], [address] [place], [place] [address]    Culture (A)  Final    ENTEROCOCCUS FAECALIS SUSCEPTIBILITIES PERFORMED ON PREVIOUS CULTURE WITHIN THE LAST 5 DAYS. Performed at [facility], [place] [address] [place], [place] [address]    Report Status 08/14/2021 FINAL  Final  Culture, blood (Routine X 2) w Reflex to ID Panel     Status: Abnormal   Collection Time: 08/11/21  5:47 PM   Specimen: BLOOD  Result Value Ref Range Status   Specimen Description  Final    BLOOD FOA Performed at [facility], [address] [place], [place] [address]    Special Requests   Final    BOTTLES DRAWN AEROBIC AND ANAEROBIC Blood Culture adequate volume Performed at [facility], [address] [place], [place] [address]    Culture  Setup Time   Final    GRAM POSITIVE COCCI IN CHAINS IN BOTH AEROBIC AND ANAEROBIC BOTTLES Gram Stain Report Called to,Read Back By and Verified With: JASON ROBBINS 08/12/21 @ 0507 BY SB Performed at [facility], [place] [address], [place], [place] [address]    Culture ENTEROCOCCUS FAECALIS (A)  Final   Report Status 08/14/2021 FINAL  Final   Organism ID, Bacteria ENTEROCOCCUS FAECALIS  Final      Susceptibility   Enterococcus faecalis - MIC*    AMPICILLIN <=2 SENSITIVE Sensitive     VANCOMYCIN 2 SENSITIVE Sensitive      GENTAMICIN SYNERGY SENSITIVE Sensitive     * ENTEROCOCCUS FAECALIS  Blood Culture ID Panel (Reflexed)     Status: Abnormal   Collection Time: 08/11/21  5:47 PM  Result Value Ref Range Status   Enterococcus faecalis DETECTED (A) NOT DETECTED Final    Comment: CRITICAL RESULT CALLED TO, READ BACK BY AND VERIFIED WITH: JASON ROBBINS 08/12/21 @ 0507 BY SB    Enterococcus Faecium NOT DETECTED NOT DETECTED Final   Listeria monocytogenes NOT DETECTED NOT DETECTED Final   Staphylococcus species NOT DETECTED NOT DETECTED Final   Staphylococcus aureus (BCID) NOT DETECTED NOT DETECTED Final   Staphylococcus epidermidis NOT DETECTED NOT DETECTED Final   Staphylococcus lugdunensis NOT DETECTED NOT DETECTED Final   Streptococcus species NOT DETECTED NOT DETECTED Final   Streptococcus agalactiae NOT DETECTED NOT DETECTED Final   Streptococcus pneumoniae NOT DETECTED NOT DETECTED Final   Streptococcus pyogenes NOT DETECTED NOT DETECTED Final   A.calcoaceticus-baumannii NOT DETECTED NOT DETECTED Final   Bacteroides fragilis NOT DETECTED NOT DETECTED Final   Enterobacterales NOT DETECTED NOT DETECTED Final   Enterobacter cloacae complex NOT DETECTED NOT DETECTED Final   Escherichia coli NOT DETECTED NOT DETECTED Final   Klebsiella aerogenes NOT DETECTED NOT DETECTED Final   Klebsiella oxytoca NOT DETECTED NOT DETECTED Final   Klebsiella pneumoniae NOT DETECTED NOT DETECTED Final   Proteus species NOT DETECTED NOT DETECTED Final   Salmonella species NOT DETECTED NOT DETECTED Final   Serratia marcescens NOT DETECTED NOT DETECTED Final   Haemophilus influenzae NOT DETECTED NOT DETECTED Final   Neisseria meningitidis NOT DETECTED NOT DETECTED Final   Pseudomonas aeruginosa NOT DETECTED NOT DETECTED Final   Stenotrophomonas maltophilia NOT DETECTED NOT DETECTED Final   Candida albicans NOT DETECTED NOT DETECTED Final   Candida auris NOT DETECTED NOT DETECTED Final   Candida glabrata NOT DETECTED NOT  DETECTED Final   Candida krusei NOT DETECTED NOT DETECTED Final   Candida parapsilosis NOT DETECTED NOT DETECTED Final   Candida tropicalis NOT DETECTED NOT DETECTED Final   Cryptococcus neoformans/gattii NOT DETECTED NOT DETECTED Final   Vancomycin resistance NOT DETECTED NOT DETECTED Final    Comment: Performed at [facility], [address] [place], [place] [address]  CULTURE, BLOOD (ROUTINE X 2) w Reflex to ID Panel     Status: None   Collection Time: 08/12/21 11:58 PM   Specimen: BLOOD  Result Value Ref Range Status   Specimen Description BLOOD BLOOD LEFT HAND  Final   Special Requests   Final    BOTTLES DRAWN  AEROBIC AND ANAEROBIC Blood Culture adequate volume   Culture   Final    NO GROWTH 5 DAYS Performed at [facility], [address] [place], [place] [address]    Report Status 08/18/2021 FINAL  Final  CULTURE, BLOOD (ROUTINE X 2) w Reflex to ID Panel     Status: None   Collection Time: 08/12/21 11:58 PM   Specimen: BLOOD  Result Value Ref Range Status   Specimen Description BLOOD LEFT RADIAL  Final   Special Requests   Final    BOTTLES DRAWN AEROBIC AND ANAEROBIC Blood Culture results may not be optimal due to an inadequate volume of blood received in culture bottles   Culture   Final    NO GROWTH 5 DAYS Performed at [facility], [address], [place], [place] [address]    Report Status 08/18/2021 FINAL  Final    Coagulation Studies: No results for input(s): LABPROT, INR in the last 72 hours.  Urinalysis: No results for input(s): COLORURINE, LABSPEC, PHURINE, GLUCOSEU, HGBUR, BILIRUBINUR, KETONESUR, PROTEINUR, UROBILINOGEN, NITRITE, LEUKOCYTESUR in the last 72 hours.  Invalid input(s): APPERANCEUR    Imaging: IR Fluoro Guide CV Line Left  Result Date: 08/18/2021 INDICATION: Long-term antibiotics.  Poor IV access EXAM: ULTRASOUND AND FLUOROSCOPIC GUIDED PLACEMENT OF TUNNELED CENTRAL VENOUS CATHETER MEDICATIONS: 20 mL of  Isovue-300 intravenous contrast. ANESTHESIA/SEDATION: Versed 0 mg IV; Fentanyl 25 mcg IV; Sedation Time: 45 minutes. The patient was continuously monitored during the procedure by the interventional radiology nurse under my direct supervision. FLUOROSCOPY TIME:  8 minutes and 0 seconds (226.3 mGy) COMPLICATIONS: None immediate. PROCEDURE: Informed written consent was obtained from the patient after a discussion of the risks, benefits, and alternatives to treatment. Questions regarding the procedure were encouraged and answered. 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 The LEFT neck and chest were prepped with chlorhexidine in a sterile fashion, and a sterile drape was applied covering the operative field. Maximum barrier sterile technique with sterile gowns and gloves were used for the procedure. A timeout was performed prior to the initiation of the procedure. After the overlying soft tissues were anesthetized, a small venotomy incision was created and a micropuncture kit was utilized to access the LEFT internal jugular vein. Real-time ultrasound guidance was utilized for vascular access including the acquisition of a permanent ultrasound image documenting patency of the accessed vessel. The patient has an indwelling RIGHT upper extremity HERO graft. Multiple attempts at accessing the RIGHT atrium along the graft catheter using a Kumpe catheter and 0.035 inch glidewire were unsuccessful. Therefore a digital subtraction angiogram (DSA) of the central veins was obtained demonstrating central venous occlusion and dilated azygous vein. The decision was made to leave the catheter tip within the azygous. A microwire was utilized to measure appropriate catheter length. The micropuncture sheath was exchanged for a peel-away sheath over a guidewire. A 5 French single lumen tunneled central venous catheter measuring 25 cm was tunneled in a retrograde  fashion from the anterior chest wall to the venotomy incision. The catheter was then placed through the peel-away sheath with tip ultimately positioned at the SVC / azygous junction. Final catheter positioning was confirmed and documented with a spot radiographic image. The catheter aspirates and flushes normally. The catheter was flushed with appropriate volume heparin dwells. The catheter exit site was secured with a 2-0 nylon retention suture. The venotomy incision was closed with Dermabond. Dressings were applied. The patient tolerated the procedure  well without immediate post procedural complication. FINDINGS: 1. 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 2. Azygous vein appears to be the dominant upper extremity cephalic venous outflow (see key image). 3. New, 5 French tunneled central venous catheter tip intentionally placed at the SVC/azygous junction. IMPRESSION: 1. Successful placement of 5 French, 25 cm single lumen tunneled "Powerline" central venous catheter via the LEFT internal jugular vein, as above. 2. 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 3. New catheter tip intentionally positioned at the SVC/azygous junction, and may migrate into the azygous vein on mobilization. PLAN: The patient may benefit from central venous recanalization. Please place referral to vascular interventional radiology for evaluation. Michaelle Birks, MD [facility] Radiology Electronically Signed   By: Michaelle Birks M.D.   On: 08/18/2021 18:56   IR US Guidance  Result Date: 08/18/2021 INDICATION: Long-term antibiotics.  Poor IV access EXAM: ULTRASOUND AND FLUOROSCOPIC GUIDED PLACEMENT OF TUNNELED CENTRAL VENOUS CATHETER MEDICATIONS: 20 mL of Isovue-300 intravenous contrast. ANESTHESIA/SEDATION: Versed 0 mg IV; Fentanyl 25 mcg IV; Sedation Time:  45 minutes. The patient was continuously monitored during the procedure by the interventional radiology nurse under my direct supervision. FLUOROSCOPY TIME:  8 minutes and 0 seconds (675.9 mGy) COMPLICATIONS: None immediate. PROCEDURE: Informed written consent was obtained from the patient after a discussion of the risks, benefits, and alternatives to treatment. Questions regarding the procedure were encouraged and answered. 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 The LEFT neck and chest were prepped with chlorhexidine in a sterile fashion, and a sterile drape was applied covering the operative field. Maximum barrier sterile technique with sterile gowns and gloves were used for the procedure. A timeout was performed prior to the initiation of the procedure. After the overlying soft tissues were anesthetized, a small venotomy incision was created and a micropuncture kit was utilized to access the LEFT internal jugular vein. Real-time ultrasound guidance was utilized for vascular access including the acquisition of a permanent ultrasound image documenting patency of the accessed vessel. The patient has an indwelling RIGHT upper extremity HERO graft. Multiple attempts at accessing the RIGHT atrium along the graft catheter using a Kumpe catheter and 0.035 inch glidewire were unsuccessful. Therefore a digital subtraction angiogram (DSA) of the central veins was obtained demonstrating central venous occlusion and dilated azygous vein. The decision was made to leave the catheter tip within the azygous. A microwire was utilized to measure appropriate catheter length. The micropuncture sheath was exchanged for a peel-away sheath over a guidewire. A 5 French single lumen tunneled central venous catheter measuring 25 cm was tunneled in a retrograde fashion from the anterior chest wall to the venotomy incision. The catheter was then placed through the  peel-away sheath with tip ultimately positioned at the SVC / azygous junction. Final catheter positioning was confirmed and documented with a spot radiographic image. The catheter aspirates and flushes normally. The catheter was flushed with appropriate volume heparin dwells. The catheter exit site was secured with a 2-0 nylon retention suture. The venotomy incision was closed with Dermabond. Dressings were applied. The patient tolerated the procedure well without immediate post procedural complication. FINDINGS: 1. 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 2. Azygous vein appears to be the dominant upper extremity cephalic venous outflow (see key image). 3.  New, 5 French tunneled central venous catheter tip intentionally placed at the SVC/azygous junction. IMPRESSION: 1. Successful placement of 5 French, 25 cm single lumen tunneled "Powerline" central venous catheter via the LEFT internal jugular vein, as above. 2. 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 3. New catheter tip intentionally positioned at the SVC/azygous junction, and may migrate into the azygous vein on mobilization. PLAN: The patient may benefit from central venous recanalization. Please place referral to vascular interventional radiology for evaluation. Michaelle Birks, MD [facility] Radiology Electronically Signed   By: Michaelle Birks M.D.   On: 08/18/2021 18:56     Medications:    ampicillin (OMNIPEN) IV 2 g (08/20/21 7353)   cefTRIAXone (ROCEPHIN)  IV 2 g (08/19/21 2149)     apixaban  2.5 mg Oral BID   Chlorhexidine Gluconate Cloth  6 each Topical Q0600   epoetin (EPOGEN/PROCRIT) injection  4,000 Units Intravenous Q M,W,F-HD   feeding supplement (NEPRO CARB STEADY)  237 mL Oral TID BM   lidocaine  2 patch Transdermal Q24H   nicotine  21 mg Transdermal  Daily   senna-docusate  1 tablet Oral BID   sevelamer carbonate  1,600 mg Oral TID WC   acetaminophen, bisacodyl, fentaNYL, heparin lock flush, hydrALAZINE, lidocaine-EPINEPHrine, LORazepam, Muscle Rub, ondansetron (ZOFRAN) IV, oxyCODONE-acetaminophen, polyethylene glycol  Assessment/ Plan:  Mr. DAMANTE SPRAGG is a 60 y.o.  male with medical conditions including anemia, CHF, hypertension, and ESRD on dialysis. Patient has presented to ED with progressive back pain.  Sedalia Raven/MWF/Rt AVG    Impression  1)Renal   End-stage renal disease Patient is on hemodialysis Patient is a Monday Wednesday Friday schedule. Patient was dialyzed yesterday patient did leave early We will continue to educate the patient about need to stay for his full treatment. We will dialyze patient in the morning No acute need for renal replacement therapy today  2)HTN    Blood pressure is stable    3)Anemia of chronic disease  CBC Latest Ref Rng & Units 08/19/2021 08/18/2021 08/17/2021  WBC 4.0 - 10.5 K/uL 8.6 9.0 7.7  Hemoglobin 13.0 - 17.0 g/dL 8.7(L) 8.0(L) 7.8(L)  Hematocrit 39.0 - 52.0 % 27.3(L) 26.2(L) 24.5(L)  Platelets 150 - 400 K/uL 233 215 195     Patient hemoglobin is not at goal Patient is on Epogen Patient hemoglobin levels are improving .   4) Secondary hyperparathyroidism -CKD Mineral-Bone Disorder    Lab Results  Component Value Date   PTH 583.2 (H) 12/11/2012   CALCIUM 10.8 (H) 08/19/2021   CAION 1.27 (H) 12/26/2012   PHOS 6.2 (H) 08/16/2021    Secondary hyperparathyroidism-present Hypercalcemia I have changed his binders from calcium based binders to non calcium based binders  Phosphorus is high We will continue educate patient about compliance with low phosphorus diet   5)Sepsis Patient has aortic valve endocarditis Patient has enterococcal bacteremia Patient has L4/L5 discitis Patient also has psoas phlegmon Patient is currently on IV antibiotics Plan  is to keep patient on 6 weeks of antibiotics as recommended by ID  Patient is to remain on ampicillin 2 g IV every 12 hours Ceftriaxone 2 g IV every 12 hours till September 23, 2021  6) Electrolytes   BMP Latest Ref Rng & Units 08/19/2021 08/18/2021 08/17/2021  Glucose 70 - 99 mg/dL 85 86 95  BUN 6 - 20 mg/dL 65(H) 50(H) 38(H)  Creatinine 0.61 - 1.24 mg/dL 9.93(H) 8.54(H) 7.01(H)  Sodium 135 - 145 mmol/L 138 136 135  Potassium 3.5 - 5.1 mmol/L 4.8 4.7 3.9  Chloride 98 - 111 mmol/L 96(L) 95(L) 96(L)  CO2 22 - 32 mmol/L 28 27 30   Calcium 8.9 - 10.3 mg/dL 10.8(H) 11.1(H) 10.2     Sodium Normonatremic   Potassium Normokalemic    7)Acid base    Co2 at goal    Plan   No acute need for renal replacement therapy today Will follow CBC, phosphorus, calcium and Chem-7    LOS: 11 Vitalia Stough s Jonthan Leite 9/11/20229:49 AM

## 2021-08-21 DIAGNOSIS — F1910 Other psychoactive substance abuse, uncomplicated: Secondary | ICD-10-CM | POA: Diagnosis not present

## 2021-08-21 DIAGNOSIS — M5440 Lumbago with sciatica, unspecified side: Secondary | ICD-10-CM | POA: Diagnosis not present

## 2021-08-21 DIAGNOSIS — I82C19 Acute embolism and thrombosis of unspecified internal jugular vein: Secondary | ICD-10-CM | POA: Diagnosis not present

## 2021-08-21 DIAGNOSIS — I358 Other nonrheumatic aortic valve disorders: Secondary | ICD-10-CM | POA: Diagnosis not present

## 2021-08-21 LAB — PHOSPHORUS: Phosphorus: 7.7 mg/dL — ABNORMAL HIGH (ref 2.5–4.6)

## 2021-08-21 LAB — CBC
HCT: 28.2 % — ABNORMAL LOW (ref 39.0–52.0)
Hemoglobin: 8.3 g/dL — ABNORMAL LOW (ref 13.0–17.0)
MCH: 28.8 pg (ref 26.0–34.0)
MCHC: 29.4 g/dL — ABNORMAL LOW (ref 30.0–36.0)
MCV: 97.9 fL (ref 80.0–100.0)
Platelets: 192 10*3/uL (ref 150–400)
RBC: 2.88 MIL/uL — ABNORMAL LOW (ref 4.22–5.81)
RDW: 18.6 % — ABNORMAL HIGH (ref 11.5–15.5)
WBC: 10.4 10*3/uL (ref 4.0–10.5)
nRBC: 0.2 % (ref 0.0–0.2)

## 2021-08-21 LAB — BASIC METABOLIC PANEL
Anion gap: 13 (ref 5–15)
BUN: 63 mg/dL — ABNORMAL HIGH (ref 6–20)
CO2: 29 mmol/L (ref 22–32)
Calcium: 10.6 mg/dL — ABNORMAL HIGH (ref 8.9–10.3)
Chloride: 99 mmol/L (ref 98–111)
Creatinine, Ser: 9.88 mg/dL — ABNORMAL HIGH (ref 0.61–1.24)
GFR, Estimated: 6 mL/min — ABNORMAL LOW (ref >60)
Glucose, Bld: 103 mg/dL — ABNORMAL HIGH (ref 70–99)
Potassium: 5 mmol/L (ref 3.5–5.1)
Sodium: 141 mmol/L (ref 135–145)

## 2021-08-21 LAB — RESP PANEL BY RT-PCR (FLU A&B, COVID) ARPGX2
Influenza A by PCR: NEGATIVE
Influenza B by PCR: NEGATIVE
SARS Coronavirus 2 by RT PCR: NEGATIVE

## 2021-08-21 MED ORDER — LIDOCAINE 5 % EX PTCH: 2.0000 | MEDICATED_PATCH | CUTANEOUS | 0 refills | Status: AC

## 2021-08-21 MED ORDER — SEVELAMER CARBONATE 800 MG PO TABS: 1600.0000 mg | ORAL_TABLET | Freq: Three times a day (TID) | ORAL | 0 refills | Status: AC

## 2021-08-21 MED ORDER — SODIUM CHLORIDE 0.9% FLUSH: 10.0000 mL | INTRAVENOUS | Status: DC | PRN

## 2021-08-21 MED ORDER — POLYETHYLENE GLYCOL 3350 17 G PO PACK: 17.0000 g | PACK | Freq: Every day | ORAL | 0 refills | Status: AC | PRN

## 2021-08-21 MED ORDER — OXYCODONE-ACETAMINOPHEN 5-325 MG PO TABS: 1.0000 | ORAL_TABLET | Freq: Four times a day (QID) | ORAL | 0 refills | Status: AC | PRN

## 2021-08-21 MED ORDER — CEFTRIAXONE IV (FOR PTA / DISCHARGE USE ONLY): 2.0000 g | Freq: Two times a day (BID) | INTRAVENOUS | 0 refills | Status: AC

## 2021-08-21 MED ORDER — APIXABAN 2.5 MG PO TABS: 2.5000 mg | ORAL_TABLET | Freq: Two times a day (BID) | ORAL | 0 refills | Status: AC

## 2021-08-21 MED ORDER — AMPICILLIN IV (FOR PTA / DISCHARGE USE ONLY): 2.0000 g | Freq: Two times a day (BID) | INTRAVENOUS | 0 refills | Status: AC

## 2021-08-21 MED ORDER — SENNOSIDES-DOCUSATE SODIUM 8.6-50 MG PO TABS: 1.0000 | ORAL_TABLET | Freq: Two times a day (BID) | ORAL | 0 refills | Status: AC

## 2021-08-21 MED ORDER — EPOETIN ALFA 4000 UNIT/ML IJ SOLN
INTRAMUSCULAR | Status: AC
  Administered 2021-08-21: 4000 [IU] via INTRAVENOUS
  Filled 2021-08-21: qty 1

## 2021-08-21 MED ORDER — METHOCARBAMOL 500 MG PO TABS: 500.0000 mg | ORAL_TABLET | Freq: Four times a day (QID) | ORAL | 0 refills | Status: AC | PRN

## 2021-08-21 NOTE — Progress Notes (Signed)
PT Cancellation Note  Patient Details Name: [name] [name] MRN: [id number] DOB: 01-02-1961   Cancelled Treatment:    Reason Eval/Treat Not Completed: Other (comment).  Pt currently off unit at dialysis.  Will re-attempt PT session at a later date/time.  Leitha Bleak, PT 08/21/21, 10:58 AM

## 2021-08-21 NOTE — Discharge Summary (Signed)
Physician Discharge Summary  [name] [name] WLN:[id number] DOB: 29-Oct-1961 DOA: 08/08/2021  PCP: Pcp, No  Admit date: 08/08/2021 Discharge date: 08/21/2021  Admitted From: Home Disposition: SNF  Recommendations for Outpatient Follow-up:  Follow up with SNF provider at earliest convenience in the next few days Outpatient follow-up with dialysis as scheduled Outpatient follow-up with ID Will need follow-up with IR at completion of antibiotic treatment for removal of tunneled catheter  follow up in ED if symptoms worsen or new appear   Home Health: No Equipment/Devices: Tunneled catheter placed by IR on 08/18/2021  Discharge Condition: Stable CODE STATUS: Full Diet recommendation: Heart healthy/renal hemodialysis diet  Brief/Interim Summary: 60 y.o. male with medical history significant of ESRD-HD, hypertension, tobacco abuse, polysubstance abuse, diverticulitis, anemia, jugular vein thrombosis, GI bleeding, right pneumothorax presented with worsening lower back pain.  He was found to have sepsis secondary to Enterococcus faecalis bacteremia.  ID was consulted and he has been on ampicillin and ceftriaxone.  He was found to have possible developing osteomyelitis in C5-C6 with possible early left psoas abscess formation and possible early discitis in L4-L5.  Nephrology was also consulted for continuation of dialysis.  TEE showed aortic valve endocarditis.  He will need IV ampicillin and Rocephin till 09/23/2021 followed by p.o. amoxicillin for a few weeks as per ID.  He had tunneled catheter placed on 08/18/2021 by IR.  PT recommended SNF placement.  He is currently medically stable for discharge.  Discharge to SNF once bed is available.    Discharge Diagnoses:   Sepsis: Possibly evolving on admission Enterococcus faecalis bacteremia Aortic valve endocarditis -ID following.  Currently on Rocephin and ampicillin. -TTE negative for vegetations. 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  ID  is recommending 6 to weeks of ampicillin and Rocephin till 09/23/2021 followed by p.o. amoxicillin for a few weeks.  -Status post tunneled catheter placement on 08/18/2021 by IR -Blood cultures from 08/11/2021 grew E faecalis.  Blood cultures from 08/12/2021 have been negative so far. -Sepsis is resolved.  Currently hemodynamically stable with no temperature spikes. -  Discharge to SNF once bed is available.   Severe lower back pain Possible developing osteomyelitis in C5-C6 Left psoas muscle infection with superimposed 1.3 cm phlegmon/early abscess formation Possible early discitis in L4-L5 -Continue antibiotics as above.  Continue pain management and bowel regimen   End-stage renal disease on hemodialysis -Nephrology following.  Dialysis as per nephrology schedule.  Outpatient follow-up with dialysis unit as scheduled  Anemia of chronic disease -From renal failure.  Hemoglobin stable  Polysubstance abuse and tobacco abuse -Patient was counseled regarding cessation by prior hospitalist.    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 Keturah Barre recommendations.  Continue Eliquis  Hypertension Hypotension -Blood pressure currently stable.  Off amlodipine  Chronic combined CHF -LVEF of 30 to 35% as per TTE on 08/13/2021.  Volume managed by dialysis.  Continue diet and fluid restriction.  Intermittent delirium -Currently mental status stable.  Bedside sitter has been discontinued.     Generalized conditioning -PT/OT recommend SNF placement.   Discharge Instructions  Discharge Instructions     [facility] pharmacist to adjust dose for Vancomycin, Aminoglycosides and other anti-infective therapies as requested by physician.   Complete by: As directed    [facility] to provide Cath Flo 37m   Complete by: As directed    Administer for PICC line occlusion and as  ordered by physician  for other access device issues.   Anaphylaxis Kit: Provided to treat any anaphylactic reaction to the medication being provided to the patient if First Dose or when requested by physician   Complete by: As directed    Epinephrine 22m/ml vial / amp: Administer 0.3616m(0.16m38msubcutaneously once for moderate to severe anaphylaxis, nurse to call physician and pharmacy when reaction occurs and call 911 if needed for immediate care   Diphenhydramine 31m2m IV vial: Administer 25-31mg25mIM PRN for first dose reaction, rash, itching, mild reaction, nurse to call physician and pharmacy when reaction occurs   Sodium Chloride 0.9% NS 500ml 34mAdminister if needed for hypovolemic blood pressure drop or as ordered by physician after call to physician with anaphylactic reaction   Change dressing on IV access line weekly and PRN   Complete by: As directed    Diet - low sodium heart healthy   Complete by: As directed    Renal hemodialysis diet   Flush IV access with Sodium Chloride 0.9% and Heparin 10 units/ml or 100 units/ml   Complete by: As directed    Home infusion instructions - [facility]   Complete by: As directed    Instructions: Flush IV access with Sodium Chloride 0.9% and Heparin 10units/ml or 100units/ml   Change dressing on IV access line: Weekly and PRN   Instructions Cath Flo 2mg: A716mnister for PICC Line occlusion and as ordered by physician for other access device   [facility] pharmacist to adjust dose for: Vancomycin, Aminoglycosides and other anti-infective therapies as requested by physician   Increase activity slowly   Complete by: As directed    Method of administration may be changed at the discretion of home infusion pharmacist based upon assessment of the patient and/or caregiver's ability to self-administer the medication ordered   Complete by: As directed       Allergies as of 08/21/2021       Reactions   Other    Other reaction(s): Other (See  Comments) blister   Tape Other (See Comments)   Plastic Tape Causes blister        Medication List     STOP taking these medications    acetaminophen 500 MG tablet Commonly known as: TYLENOL   amLODipine 10 MG tablet Commonly known as: NORVASC   calcium acetate 667 MG capsule Commonly known as: PHOSLO   promethazine 25 MG tablet Commonly known as: PHENERGAN       TAKE these medications    ampicillin  IVPB Inject 2 g into the vein every 12 (twelve) hours. Indication:  Enterococcus faecalis Bacteremia, lumbar discitis and endocarditis Endocarditis First Dose: Yes Last Day of Therapy:  09/23/21 Labs - Once weekly:CBC with differential and CMP  Line will have to be removed at the end of antibiotic treatment Fax weekly labs to  Dr.Ravishankar [phone] of administration may be changed at the discretion of home infusion pharmacist based upon assessment of the patient and/or caregiver's ability to self-administer the medication ordered.   apixaban 2.5 MG Tabs tablet Commonly known as: ELIQUIS Take 1 tablet (2.5 mg total) by mouth 2 (two) times daily. What changed:  medication strength how much to take when to take this   cefTRIAXone  IVPB Commonly known as: ROCEPHIN Inject 2 g into the vein every 12 (twelve) hours. Indication:   Enterococcus faecalis Bacteremia, lumbar discitis and endocarditis Endocarditis First Dose: Yes Last Day of Therapy:  09/23/21 Labs - Once  weekly:CBC with differential and CMP  Line will have to be removed at the end of antibiotic treatment Fax weekly labs to  Dr.Ravishankar [phone] Method of administration: IV Push Method of administration may be changed at the discretion of home infusion pharmacist based upon assessment of the patient and/or caregiver's ability to self-administer the medication ordered.   lidocaine 5 % Commonly known as: LIDODERM Place 2 patches onto the skin daily. Remove & Discard patch within 12  hours or as directed by MD   methocarbamol 500 MG tablet Commonly known as: ROBAXIN Take 1 tablet (500 mg total) by mouth every 6 (six) hours as needed for muscle spasms.   oxyCODONE-acetaminophen 5-325 MG tablet Commonly known as: PERCOCET/ROXICET Take 1 tablet by mouth every 6 (six) hours as needed for moderate pain.   polyethylene glycol 17 g packet Commonly known as: MIRALAX / GLYCOLAX Take 17 g by mouth daily as needed for moderate constipation.   senna-docusate 8.6-50 MG tablet Commonly known as: Senokot-S Take 1 tablet by mouth 2 (two) times daily.   sevelamer carbonate 800 MG tablet Commonly known as: RENVELA Take 2 tablets (1,600 mg total) by mouth 3 (three) times daily with meals.               Discharge Care Instructions  (From admission, onward)           Start     Ordered   08/21/21 0000  Change dressing on IV access line weekly and PRN  (Home infusion instructions - [facility] )        08/21/21 0940            Contact information for follow-up providers     Tsosie Billing, MD Follow up in 3 week(s).   Specialty: Infectious Diseases Contact information: [place] [address] [phone]              Contact information for after-discharge care     Destination     [facility] Preferred SNF .   Service: Skilled Nursing Contact information: [address] [address] [phone]                    Allergies  Allergen Reactions   Other     Other reaction(s): Other (See Comments) blister   Tape Other (See Comments)    Plastic Tape Causes blister    Consultations: ID/cardiology/nephrology/IR   Procedures/Studies: DG Chest 1 View  Result Date: 08/11/2021 CLINICAL DATA:  Kidney damage. End-stage renal disease on hemodialysis. EXAM: CHEST  1 VIEW COMPARISON:  04/16/2015 and 08/18/2013 radiographs.  CT 03/06/2013. FINDINGS: 1352 hours. Two  views obtained. Mild patient rotation to the right. Right IJ hemodialysis catheters project to the level of the inferior right atrium. There is progressive cardiac enlargement. There is chronic volume loss and pleural thickening in the right hemithorax which appears similar to the prior studies allowing for the rotation. The left lung is clear. There is no pneumothorax or significant pleural effusion. The bones appear unchanged. IMPRESSION: No evidence of acute cardiopulmonary process. Stable chronic volume loss and pleuroparenchymal scarring in the right hemithorax. Electronically Signed   By: Richardean Sale M.D.   On: 08/11/2021 16:01   CT Lumbar Spine Wo Contrast  Result Date: 08/08/2021 CLINICAL DATA:  Low back pain, increased fracture risk EXAM: CT LUMBAR SPINE WITHOUT CONTRAST TECHNIQUE: Multidetector CT imaging of the lumbar spine was performed without intravenous contrast administration. Multiplanar CT  image reconstructions were also generated. COMPARISON:  None. FINDINGS: Segmentation: 5 lumbar type vertebrae. Alignment: Grade 1 anterolisthesis at L4-L5. 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 Degenerative plate irregularity and Schmorl's nodes. Vertebral body heights are unchanged. Paraspinal and other soft tissues: Partially imaged small volume free fluid in the dependent pelvis. Colonic diverticulosis. Atrophic horseshoe kidney with multiple cysts. Aortic atherosclerosis. Multiple collateral vessels in the posterior abdominal wall. Disc levels: L1-L2: Facet hypertrophy. No significant canal or foraminal stenosis. L2-L3: Disc bulge. Facet hypertrophy. No significant canal or foraminal stenosis. L3-L4: Disc bulge. Facet hypertrophy. No significant canal or foraminal stenosis. L4-L5: Anterolisthesis with uncovering of disc bulge. Facet hypertrophy. Ligamentum flavum thickening. Probable moderate canal stenosis. Bilateral foraminal stenosis, left greater than  right. L5-S1: Disc bulge with endplate osteophytic ridging. Facet hypertrophy. Ligamentum flavum thickening. No significant canal stenosis. Bilateral foraminal stenosis. IMPRESSION: No acute osseous abnormality. Multilevel degenerative changes as detailed above. Canal and foraminal stenosis are greatest at L4-L5. Partially imaged nonspecific small volume free fluid in the dependent pelvis. Electronically Signed   By: Macy Mis M.D.   On: 08/08/2021 09:43   MR CERVICAL SPINE WO CONTRAST  Result Date: 08/12/2021 CLINICAL DATA:  Neck pain, acute.  Infection suspected.  Sepsis. EXAM: MRI CERVICAL AND THORACIC SPINE WITHOUT CONTRAST TECHNIQUE: Multiplanar and multiecho pulse sequences of the cervical spine, to include the craniocervical junction and cervicothoracic junction, and the thoracic spine, were obtained without intravenous contrast. COMPARISON:  Lumbar spine CT 08/08/2021. FINDINGS: MRI CERVICAL SPINE FINDINGS Alignment: Unremarkable Vertebrae: Fatty marrow conversion at C5-6. No visible marrow inflammation, acute fracture, or aggressive bone lesion. Cord: No visible swelling or edema. No evidence of spinal collection. Posterior Fossa, vertebral arteries, paraspinal tissues: Mild prevertebral/retropharyngeal edema without visible source. Disc levels: Focal disc degeneration at C6-7 with chronic inferior endplate fracture or Schmorl's node at C5. Mild or moderate left foraminal narrowing at this level from disc bulging and uncovertebral ridging. MRI THORACIC SPINE FINDINGS Alignment:  Unremarkable Vertebrae: No visible marrow edema, fracture, or discitis. L2 inferior endplate Schmorl's node fatty marrow conversion. Diffusely low marrow signal. Cord: No visible intrathecal collection. There are pleural effusions bilaterally. Paraspinal and other soft tissues: Prominent paravertebral T2 hyperintensity which is presumably fat that extends into the bilateral subpleural space. Disc levels: No visible  herniation or impingement. IMPRESSION: 1. Mild prevertebral/retropharyngeal edema without visible underlying osteomyelitis or septic arthritis. If persistent/progressive symptoms please have low threshold for follow-up given the suboptimal study quality. 2. Generally abnormal marrow signal needing correlation with CBC. Electronically Signed   By: Monte Fantasia M.D.   On: 08/12/2021 09:00   MR THORACIC SPINE WO CONTRAST  Result Date: 08/12/2021 CLINICAL DATA:  Neck pain, acute.  Infection suspected.  Sepsis. EXAM: MRI CERVICAL AND THORACIC SPINE WITHOUT CONTRAST TECHNIQUE: Multiplanar and multiecho pulse sequences of the cervical spine, to include the craniocervical junction and cervicothoracic junction, and the thoracic spine, were obtained without intravenous contrast. COMPARISON:  Lumbar spine CT 08/08/2021. FINDINGS: MRI CERVICAL SPINE FINDINGS Alignment: Unremarkable Vertebrae: Fatty marrow conversion at C5-6. No visible marrow inflammation, acute fracture, or aggressive bone lesion. Cord: No visible swelling or edema. No evidence of spinal collection. Posterior Fossa, vertebral arteries, paraspinal tissues: Mild prevertebral/retropharyngeal edema without visible source. Disc levels: Focal disc degeneration at C6-7 with chronic inferior endplate fracture or Schmorl's node at C5. Mild or moderate left foraminal narrowing at this level from disc bulging and uncovertebral ridging. MRI THORACIC SPINE FINDINGS Alignment:  Unremarkable Vertebrae: No visible marrow edema,  fracture, or discitis. L2 inferior endplate Schmorl's node fatty marrow conversion. Diffusely low marrow signal. Cord: No visible intrathecal collection. There are pleural effusions bilaterally. Paraspinal and other soft tissues: Prominent paravertebral T2 hyperintensity which is presumably fat that extends into the bilateral subpleural space. Disc levels: No visible herniation or impingement. IMPRESSION: 1. Mild prevertebral/retropharyngeal  edema without visible underlying osteomyelitis or septic arthritis. If persistent/progressive symptoms please have low threshold for follow-up given the suboptimal study quality. 2. Generally abnormal marrow signal needing correlation with CBC. Electronically Signed   By: Monte Fantasia M.D.   On: 08/12/2021 09:00   MR CERVICAL SPINE W WO CONTRAST  Result Date: 08/13/2021 CLINICAL DATA:  Initial evaluation for acute severe lower back pain, sepsis, concern for infection. EXAM: MRI CERVICAL, THORACIC AND LUMBAR SPINE WITHOUT AND WITH CONTRAST TECHNIQUE: Multiplanar and multiecho pulse sequences of the cervical spine, to include the craniocervical junction and cervicothoracic junction, and thoracic and lumbar spine, were obtained without and with intravenous contrast. CONTRAST:  77m GADAVIST GADOBUTROL 1 MMOL/ML IV SOLN COMPARISON:  Comparison made with previous MRI from 08/12/2021 as well as prior CT from 08/08/2021. FINDINGS: MRI CERVICAL SPINE FINDINGS Alignment: Physiologic with preservation of the normal cervical lordosis. Vertebrae: Diffusely decreased T1 signal intensity seen throughout the visualized bone marrow, nonspecific, but most commonly related to anemia, smoking, or obesity. Fatty marrow conversion noted about the C5-6 interspace. No discrete or worrisome osseous lesions. There is increased STIR signal intensity with possible mild enhancement about the C5-6 interspace as compared to previous, suspicious for possible developing osteomyelitis no convincing evidence for acute discitis within the intervening C5-6 interspace at this time. No other abnormal marrow edema or enhancement to suggest acute infection elsewhere within the cervical spine. Cord: Normal signal and morphology. No epidural abscess or other collection. No abnormal enhancement. Posterior Fossa, vertebral arteries, paraspinal tissues: Prevertebral/retropharyngeal edema again seen extending from the skull base to the level of C5-6,  slightly increased in prominence as compared to prior exam. No visible loculated soft tissue collection. Normal flow voids seen within the vertebral arteries bilaterally. Disc levels: No other new or progressive finding as compared to recent MRI. No high-grade spinal stenosis. MRI THORACIC SPINE FINDINGS Alignment: Physiologic with preservation of the normal thoracic kyphosis. No listhesis. Vertebrae: Diffusely decreased T1 signal intensity seen throughout the visualized bone marrow. No discrete or worrisome osseous lesions. No abnormal marrow edema or enhancement to suggest osteomyelitis discitis or septic arthritis. Cord: Normal signal and morphology. No epidural abscess or other collection. No abnormal enhancement. Paraspinal and other soft tissues: Prominent paravertebral T2 hyperintense soft tissue again seen, likely reflecting prominent paravertebral fat. Extension into the subpleural spaces bilaterally. Superimposed irregular left pleural effusion which appears partially loculated. Multiple prominent venous collaterals noted throughout the visualized subcutaneous fat. Disc levels: No significant disc pathology.  No stenosis or neural impingement. MRI LUMBAR SPINE FINDINGS Segmentation: Standard. Lowest well-formed disc space labeled the L5-S1 level. Alignment: Trace anterolisthesis of L4 on L5, chronic and facet mediated. Alignment otherwise normal with preservation of the normal lumbar lordosis. Vertebrae: Diffusely decreased T1 signal intensity seen throughout the visualized bone marrow. No discrete or worrisome osseous lesions. Endplate Schmorl's node deformities with associated fatty marrow conversion seen about the L2 vertebral body. There is fluid signal intensity within the L4-5 disc space without discernible enhancement, nonspecific, but could reflect early changes of acute discitis. No overt evidence for associated osteomyelitis within the adjacent vertebral bodies. Marrow edema and enhancement seen  about the L4-5 facets  bilaterally, favored to be degenerative in nature. No other abnormal marrow edema or enhancement. Conus medullaris and cauda equina: Conus extends to the L1-2 level. Conus and cauda equina appear normal. No epidural abscess or other collection. No abnormal enhancement. Paraspinal and other soft tissues: There is abnormal edema and enhancement involving the left psoas muscle, extending from the level of L3 inferiorly (series 41, image 35). Findings concerning for acute infection. Superimposed 1.3 cm area of T2 signal intensity suspicious for phlegmon/early abscess formation (series 34, image 38). No other loculated soft tissue collections. Multiple prominent venous collaterals noted throughout the subcutaneous fat. 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 Disc levels: L1-2: Endplate Schmorl's node deformity without significant disc bulge. Mild facet hypertrophy. No stenosis. L2-3: Mild disc bulge with endplate Schmorl's node deformity. No spinal stenosis. Mild right L2 foraminal narrowing. L3-4: Mild disc bulge. Mild bilateral facet hypertrophy. No significant spinal stenosis. Mild right L3 foraminal narrowing. L4-5: Disc bulge with reactive endplate change. Nonspecific fluid signal intensity again noted within the L4-5 interspace. Moderate bilateral facet hypertrophy with associated reactive marrow edema and enhancement and small joint effusions. Resultant mild canal with mild to moderate left greater than right lateral recess stenosis. Mild right with moderate left L4 foraminal narrowing. L5-S1: Mild disc bulge with reactive endplate spurring. No significant spinal stenosis. Mild bilateral L5 foraminal narrowing. IMPRESSION: 1. Interval development of marrow edema about the C5-6 interspace since recent exam, concerning for possible developing osteomyelitis. Adjacent prevertebral/retropharyngeal edema has slightly increased from prior. 2. Abnormal inflammatory  changes involving the left psoas muscle, concerning for acute infection with myositis. Superimposed 1.3 cm area of T2 signal intensity suspicious for phlegmon/early abscess formation. Fluid signal intensity within the L4-5 interspace is nonspecific, but could reflect changes of early discitis, and possibly be the source of the left psoas infection. 3. Moderate facet degeneration at L4-5 with associated reactive marrow edema and enhancement. Findings are favored to be degenerative in nature, although attention at follow-up is recommended. 4. Irregular and partially loculated left pleural effusion. 5. Underlying diffusely abnormal marrow edema, stable from prior. Electronically Signed   By: Jeannine Boga M.D.   On: 08/13/2021 20:29   MR THORACIC SPINE W WO CONTRAST  Result Date: 08/13/2021 CLINICAL DATA:  Initial evaluation for acute severe lower back pain, sepsis, concern for infection. EXAM: MRI CERVICAL, THORACIC AND LUMBAR SPINE WITHOUT AND WITH CONTRAST TECHNIQUE: Multiplanar and multiecho pulse sequences of the cervical spine, to include the craniocervical junction and cervicothoracic junction, and thoracic and lumbar spine, were obtained without and with intravenous contrast. CONTRAST:  13m GADAVIST GADOBUTROL 1 MMOL/ML IV SOLN COMPARISON:  Comparison made with previous MRI from 08/12/2021 as well as prior CT from 08/08/2021. FINDINGS: MRI CERVICAL SPINE FINDINGS Alignment: Physiologic with preservation of the normal cervical lordosis. Vertebrae: Diffusely decreased T1 signal intensity seen throughout the visualized bone marrow, nonspecific, but most commonly related to anemia, smoking, or obesity. Fatty marrow conversion noted about the C5-6 interspace. No discrete or worrisome osseous lesions. There is increased STIR signal intensity with possible mild enhancement about the C5-6 interspace as compared to previous, suspicious for possible developing osteomyelitis no convincing evidence for acute  discitis within the intervening C5-6 interspace at this time. No other abnormal marrow edema or enhancement to suggest acute infection elsewhere within the cervical spine. Cord: Normal signal and morphology. No epidural abscess or other collection. No abnormal enhancement. Posterior Fossa, vertebral arteries, paraspinal tissues: Prevertebral/retropharyngeal edema again seen extending from the skull base to  the level of C5-6, slightly increased in prominence as compared to prior exam. No visible loculated soft tissue collection. Normal flow voids seen within the vertebral arteries bilaterally. Disc levels: No other new or progressive finding as compared to recent MRI. No high-grade spinal stenosis. MRI THORACIC SPINE FINDINGS Alignment: Physiologic with preservation of the normal thoracic kyphosis. No listhesis. Vertebrae: Diffusely decreased T1 signal intensity seen throughout the visualized bone marrow. No discrete or worrisome osseous lesions. No abnormal marrow edema or enhancement to suggest osteomyelitis discitis or septic arthritis. Cord: Normal signal and morphology. No epidural abscess or other collection. No abnormal enhancement. Paraspinal and other soft tissues: Prominent paravertebral T2 hyperintense soft tissue again seen, likely reflecting prominent paravertebral fat. Extension into the subpleural spaces bilaterally. Superimposed irregular left pleural effusion which appears partially loculated. Multiple prominent venous collaterals noted throughout the visualized subcutaneous fat. Disc levels: No significant disc pathology.  No stenosis or neural impingement. MRI LUMBAR SPINE FINDINGS Segmentation: Standard. Lowest well-formed disc space labeled the L5-S1 level. Alignment: Trace anterolisthesis of L4 on L5, chronic and facet mediated. Alignment otherwise normal with preservation of the normal lumbar lordosis. Vertebrae: Diffusely decreased T1 signal intensity seen throughout the visualized bone  marrow. No discrete or worrisome osseous lesions. Endplate Schmorl's node deformities with associated fatty marrow conversion seen about the L2 vertebral body. There is fluid signal intensity within the L4-5 disc space without discernible enhancement, nonspecific, but could reflect early changes of acute discitis. No overt evidence for associated osteomyelitis within the adjacent vertebral bodies. Marrow edema and enhancement seen about the L4-5 facets bilaterally, favored to be degenerative in nature. No other abnormal marrow edema or enhancement. Conus medullaris and cauda equina: Conus extends to the L1-2 level. Conus and cauda equina appear normal. No epidural abscess or other collection. No abnormal enhancement. Paraspinal and other soft tissues: There is abnormal edema and enhancement involving the left psoas muscle, extending from the level of L3 inferiorly (series 41, image 35). Findings concerning for acute infection. Superimposed 1.3 cm area of T2 signal intensity suspicious for phlegmon/early abscess formation (series 34, image 38). No other loculated soft tissue collections. Multiple prominent venous collaterals noted throughout the subcutaneous fat. 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 Disc levels: L1-2: Endplate Schmorl's node deformity without significant disc bulge. Mild facet hypertrophy. No stenosis. L2-3: Mild disc bulge with endplate Schmorl's node deformity. No spinal stenosis. Mild right L2 foraminal narrowing. L3-4: Mild disc bulge. Mild bilateral facet hypertrophy. No significant spinal stenosis. Mild right L3 foraminal narrowing. L4-5: Disc bulge with reactive endplate change. Nonspecific fluid signal intensity again noted within the L4-5 interspace. Moderate bilateral facet hypertrophy with associated reactive marrow edema and enhancement and small joint effusions. Resultant mild canal with mild to moderate left greater than right lateral recess stenosis.  Mild right with moderate left L4 foraminal narrowing. L5-S1: Mild disc bulge with reactive endplate spurring. No significant spinal stenosis. Mild bilateral L5 foraminal narrowing. IMPRESSION: 1. Interval development of marrow edema about the C5-6 interspace since recent exam, concerning for possible developing osteomyelitis. Adjacent prevertebral/retropharyngeal edema has slightly increased from prior. 2. Abnormal inflammatory changes involving the left psoas muscle, concerning for acute infection with myositis. Superimposed 1.3 cm area of T2 signal intensity suspicious for phlegmon/early abscess formation. Fluid signal intensity within the L4-5 interspace is nonspecific, but could reflect changes of early discitis, and possibly be the source of the left psoas infection. 3. Moderate facet degeneration at L4-5 with associated reactive marrow edema and enhancement. Findings  are favored to be degenerative in nature, although attention at follow-up is recommended. 4. Irregular and partially loculated left pleural effusion. 5. Underlying diffusely abnormal marrow edema, stable from prior. Electronically Signed   By: Jeannine Boga M.D.   On: 08/13/2021 20:29   MR Lumbar Spine W Wo Contrast  Result Date: 08/13/2021 CLINICAL DATA:  Initial evaluation for acute severe lower back pain, sepsis, concern for infection. EXAM: MRI CERVICAL, THORACIC AND LUMBAR SPINE WITHOUT AND WITH CONTRAST TECHNIQUE: Multiplanar and multiecho pulse sequences of the cervical spine, to include the craniocervical junction and cervicothoracic junction, and thoracic and lumbar spine, were obtained without and with intravenous contrast. CONTRAST:  12m GADAVIST GADOBUTROL 1 MMOL/ML IV SOLN COMPARISON:  Comparison made with previous MRI from 08/12/2021 as well as prior CT from 08/08/2021. FINDINGS: MRI CERVICAL SPINE FINDINGS Alignment: Physiologic with preservation of the normal cervical lordosis. Vertebrae: Diffusely decreased T1 signal  intensity seen throughout the visualized bone marrow, nonspecific, but most commonly related to anemia, smoking, or obesity. Fatty marrow conversion noted about the C5-6 interspace. No discrete or worrisome osseous lesions. There is increased STIR signal intensity with possible mild enhancement about the C5-6 interspace as compared to previous, suspicious for possible developing osteomyelitis no convincing evidence for acute discitis within the intervening C5-6 interspace at this time. No other abnormal marrow edema or enhancement to suggest acute infection elsewhere within the cervical spine. Cord: Normal signal and morphology. No epidural abscess or other collection. No abnormal enhancement. Posterior Fossa, vertebral arteries, paraspinal tissues: Prevertebral/retropharyngeal edema again seen extending from the skull base to the level of C5-6, slightly increased in prominence as compared to prior exam. No visible loculated soft tissue collection. Normal flow voids seen within the vertebral arteries bilaterally. Disc levels: No other new or progressive finding as compared to recent MRI. No high-grade spinal stenosis. MRI THORACIC SPINE FINDINGS Alignment: Physiologic with preservation of the normal thoracic kyphosis. No listhesis. Vertebrae: Diffusely decreased T1 signal intensity seen throughout the visualized bone marrow. No discrete or worrisome osseous lesions. No abnormal marrow edema or enhancement to suggest osteomyelitis discitis or septic arthritis. Cord: Normal signal and morphology. No epidural abscess or other collection. No abnormal enhancement. Paraspinal and other soft tissues: Prominent paravertebral T2 hyperintense soft tissue again seen, likely reflecting prominent paravertebral fat. Extension into the subpleural spaces bilaterally. Superimposed irregular left pleural effusion which appears partially loculated. Multiple prominent venous collaterals noted throughout the visualized subcutaneous fat.  Disc levels: No significant disc pathology.  No stenosis or neural impingement. MRI LUMBAR SPINE FINDINGS Segmentation: Standard. Lowest well-formed disc space labeled the L5-S1 level. Alignment: Trace anterolisthesis of L4 on L5, chronic and facet mediated. Alignment otherwise normal with preservation of the normal lumbar lordosis. Vertebrae: Diffusely decreased T1 signal intensity seen throughout the visualized bone marrow. No discrete or worrisome osseous lesions. Endplate Schmorl's node deformities with associated fatty marrow conversion seen about the L2 vertebral body. There is fluid signal intensity within the L4-5 disc space without discernible enhancement, nonspecific, but could reflect early changes of acute discitis. No overt evidence for associated osteomyelitis within the adjacent vertebral bodies. Marrow edema and enhancement seen about the L4-5 facets bilaterally, favored to be degenerative in nature. No other abnormal marrow edema or enhancement. Conus medullaris and cauda equina: Conus extends to the L1-2 level. Conus and cauda equina appear normal. No epidural abscess or other collection. No abnormal enhancement. Paraspinal and other soft tissues: There is abnormal edema and enhancement involving the left psoas muscle, extending from  the level of L3 inferiorly (series 41, image 35). Findings concerning for acute infection. Superimposed 1.3 cm area of T2 signal intensity suspicious for phlegmon/early abscess formation (series 34, image 38). No other loculated soft tissue collections. Multiple prominent venous collaterals noted throughout the subcutaneous fat. 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 Disc levels: L1-2: Endplate Schmorl's node deformity without significant disc bulge. Mild facet hypertrophy. No stenosis. L2-3: Mild disc bulge with endplate Schmorl's node deformity. No spinal stenosis. Mild right L2 foraminal narrowing. L3-4: Mild disc bulge. Mild  bilateral facet hypertrophy. No significant spinal stenosis. Mild right L3 foraminal narrowing. L4-5: Disc bulge with reactive endplate change. Nonspecific fluid signal intensity again noted within the L4-5 interspace. Moderate bilateral facet hypertrophy with associated reactive marrow edema and enhancement and small joint effusions. Resultant mild canal with mild to moderate left greater than right lateral recess stenosis. Mild right with moderate left L4 foraminal narrowing. L5-S1: Mild disc bulge with reactive endplate spurring. No significant spinal stenosis. Mild bilateral L5 foraminal narrowing. IMPRESSION: 1. Interval development of marrow edema about the C5-6 interspace since recent exam, concerning for possible developing osteomyelitis. Adjacent prevertebral/retropharyngeal edema has slightly increased from prior. 2. Abnormal inflammatory changes involving the left psoas muscle, concerning for acute infection with myositis. Superimposed 1.3 cm area of T2 signal intensity suspicious for phlegmon/early abscess formation. Fluid signal intensity within the L4-5 interspace is nonspecific, but could reflect changes of early discitis, and possibly be the source of the left psoas infection. 3. Moderate facet degeneration at L4-5 with associated reactive marrow edema and enhancement. Findings are favored to be degenerative in nature, although attention at follow-up is recommended. 4. Irregular and partially loculated left pleural effusion. 5. Underlying diffusely abnormal marrow edema, stable from prior. Electronically Signed   By: Jeannine Boga M.D.   On: 08/13/2021 20:29   IR Fluoro Guide CV Line Left  Result Date: 08/18/2021 INDICATION: Long-term antibiotics.  Poor IV access EXAM: ULTRASOUND AND FLUOROSCOPIC GUIDED PLACEMENT OF TUNNELED CENTRAL VENOUS CATHETER MEDICATIONS: 20 mL of Isovue-300 intravenous contrast. ANESTHESIA/SEDATION: Versed 0 mg IV; Fentanyl 25 mcg IV; Sedation Time: 45 minutes. The  patient was continuously monitored during the procedure by the interventional radiology nurse under my direct supervision. FLUOROSCOPY TIME:  8 minutes and 0 seconds (329.5 mGy) COMPLICATIONS: None immediate. PROCEDURE: Informed written consent was obtained from the patient after a discussion of the risks, benefits, and alternatives to treatment. Questions regarding the procedure were encouraged and answered. 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 The LEFT neck and chest were prepped with chlorhexidine in a sterile fashion, and a sterile drape was applied covering the operative field. Maximum barrier sterile technique with sterile gowns and gloves were used for the procedure. A timeout was performed prior to the initiation of the procedure. After the overlying soft tissues were anesthetized, a small venotomy incision was created and a micropuncture kit was utilized to access the LEFT internal jugular vein. Real-time ultrasound guidance was utilized for vascular access including the acquisition of a permanent ultrasound image documenting patency of the accessed vessel. The patient has an indwelling RIGHT upper extremity HERO graft. Multiple attempts at accessing the RIGHT atrium along the graft catheter using a Kumpe catheter and 0.035 inch glidewire were unsuccessful. Therefore a digital subtraction angiogram (DSA) of the central veins was obtained demonstrating central venous occlusion and dilated azygous vein. The decision was made to leave the catheter tip within the azygous. A microwire  was utilized to measure appropriate catheter length. The micropuncture sheath was exchanged for a peel-away sheath over a guidewire. A 5 French single lumen tunneled central venous catheter measuring 25 cm was tunneled in a retrograde fashion from the anterior chest wall to the venotomy incision. The catheter was then placed through the peel-away sheath with  tip ultimately positioned at the SVC / azygous junction. Final catheter positioning was confirmed and documented with a spot radiographic image. The catheter aspirates and flushes normally. The catheter was flushed with appropriate volume heparin dwells. The catheter exit site was secured with a 2-0 nylon retention suture. The venotomy incision was closed with Dermabond. Dressings were applied. The patient tolerated the procedure well without immediate post procedural complication. FINDINGS: 1. 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 2. Azygous vein appears to be the dominant upper extremity cephalic venous outflow (see key image). 3. New, 5 French tunneled central venous catheter tip intentionally placed at the SVC/azygous junction. IMPRESSION: 1. Successful placement of 5 French, 25 cm single lumen tunneled "Powerline" central venous catheter via the LEFT internal jugular vein, as above. 2. 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 3. New catheter tip intentionally positioned at the SVC/azygous junction, and may migrate into the azygous vein on mobilization. PLAN: The patient may benefit from central venous recanalization. Please place referral to vascular interventional radiology for evaluation. Michaelle Birks, MD [facility] Radiology Electronically Signed   By: Michaelle Birks M.D.   On: 08/18/2021 18:56   IR US Guidance  Result Date: 08/18/2021 INDICATION: Long-term antibiotics.  Poor IV access EXAM: ULTRASOUND AND FLUOROSCOPIC GUIDED PLACEMENT OF TUNNELED CENTRAL VENOUS CATHETER MEDICATIONS: 20 mL of Isovue-300 intravenous contrast. ANESTHESIA/SEDATION: Versed 0 mg IV; Fentanyl 25 mcg IV; Sedation Time: 45 minutes. The patient was continuously monitored during the procedure by the interventional radiology nurse under my direct  supervision. FLUOROSCOPY TIME:  8 minutes and 0 seconds (676.7 mGy) COMPLICATIONS: None immediate. PROCEDURE: Informed written consent was obtained from the patient after a discussion of the risks, benefits, and alternatives to treatment. Questions regarding the procedure were encouraged and answered. 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 The LEFT neck and chest were prepped with chlorhexidine in a sterile fashion, and a sterile drape was applied covering the operative field. Maximum barrier sterile technique with sterile gowns and gloves were used for the procedure. A timeout was performed prior to the initiation of the procedure. After the overlying soft tissues were anesthetized, a small venotomy incision was created and a micropuncture kit was utilized to access the LEFT internal jugular vein. Real-time ultrasound guidance was utilized for vascular access including the acquisition of a permanent ultrasound image documenting patency of the accessed vessel. The patient has an indwelling RIGHT upper extremity HERO graft. Multiple attempts at accessing the RIGHT atrium along the graft catheter using a Kumpe catheter and 0.035 inch glidewire were unsuccessful. Therefore a digital subtraction angiogram (DSA) of the central veins was obtained demonstrating central venous occlusion and dilated azygous vein. The decision was made to leave the catheter tip within the azygous. A microwire was utilized to measure appropriate catheter length. The micropuncture sheath was exchanged for a peel-away sheath over a guidewire. A 5 French single lumen tunneled central venous catheter measuring 25 cm was tunneled in a retrograde fashion from the anterior chest wall to  the venotomy incision. The catheter was then placed through the peel-away sheath with tip ultimately positioned at the SVC / azygous junction. Final catheter positioning was confirmed and  documented with a spot radiographic image. The catheter aspirates and flushes normally. The catheter was flushed with appropriate volume heparin dwells. The catheter exit site was secured with a 2-0 nylon retention suture. The venotomy incision was closed with Dermabond. Dressings were applied. The patient tolerated the procedure well without immediate post procedural complication. FINDINGS: 1. 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 2. Azygous vein appears to be the dominant upper extremity cephalic venous outflow (see key image). 3. New, 5 French tunneled central venous catheter tip intentionally placed at the SVC/azygous junction. IMPRESSION: 1. Successful placement of 5 French, 25 cm single lumen tunneled "Powerline" central venous catheter via the LEFT internal jugular vein, as above. 2. 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 3. New catheter tip intentionally positioned at the SVC/azygous junction, and may migrate into the azygous vein on mobilization. PLAN: The patient may benefit from central venous recanalization. Please place referral to vascular interventional radiology for evaluation. Michaelle Birks, MD [facility] Radiology Electronically Signed   By: Michaelle Birks M.D.   On: 08/18/2021 18:56   ECHOCARDIOGRAM COMPLETE  Result Date: 08/13/2021    ECHOCARDIOGRAM REPORT   Patient Name:   [name] [name] Date of Exam: 08/13/2021 Medical Rec #:  [id number]     Height:       73.0 in Accession #:    [id number]    Weight:       200.6 lb Date of Birth:  05-11-1961     BSA:          2.154 m Patient Age:    60 years      BP:           109/48 mmHg Patient Gender: M             HR:           95 bpm. Exam Location:  [facility] Procedure: 2D Echo, 3D Echo and Strain Analysis Indications:     Bacteremia R78.81  History:         Patient has no  prior history of Echocardiogram examinations.  Sonographer:     Kathlen Brunswick RDCS Referring Phys:  [id number] Otila Kluver LAI Diagnosing Phys: Kate Sable MD IMPRESSIONS  1. Left ventricular ejection fraction, by estimation, is 30 to 35%. The left ventricle has moderate to severely decreased function. The left ventricle demonstrates global hypokinesis. There is mild left ventricular hypertrophy. Left ventricular diastolic parameters are consistent with Grade I diastolic dysfunction (impaired relaxation). The average left ventricular global longitudinal strain is -6.4 %. The global longitudinal strain is abnormal.  2. Right ventricular systolic function is moderately reduced. The right ventricular size is moderately enlarged.  3. Right atrial size was mild to moderately dilated.  4. The mitral valve is degenerative. Mild mitral valve regurgitation.  5. Tricuspid valve regurgitation is mild to moderate.  6. The aortic valve is calcified. Aortic valve regurgitation is moderate. Mild to moderate aortic valve stenosis. Aortic valve area, by VTI measures 1.52 cm. Aortic valve mean gradient measures 20.2 mmHg.  7. The inferior vena cava is normal in size with <50% respiratory variability, suggesting right atrial pressure of 8 mmHg. FINDINGS  Left Ventricle: Left ventricular ejection fraction, by estimation, is  30 to 35%. The left ventricle has moderate to severely decreased function. The left ventricle demonstrates global hypokinesis. The average left ventricular global longitudinal strain is -6.4 %. The global longitudinal strain is abnormal. 3D left ventricular ejection fraction analysis performed but not reported based on interpreter judgement due to suboptimal quality. The left ventricular internal cavity size was normal in size. There  is mild left ventricular hypertrophy. Left ventricular diastolic parameters are consistent with Grade I diastolic dysfunction (impaired relaxation). Right Ventricle: The right  ventricular size is moderately enlarged. No increase in right ventricular wall thickness. Right ventricular systolic function is moderately reduced. Left Atrium: Left atrial size was normal in size. Right Atrium: Right atrial size was mild to moderately dilated. Pericardium: There is no evidence of pericardial effusion. Mitral Valve: The mitral valve is degenerative in appearance. Mild mitral valve regurgitation. Tricuspid Valve: The tricuspid valve is not well visualized. Tricuspid valve regurgitation is mild to moderate. Aortic Valve: The aortic valve is calcified. Aortic valve regurgitation is moderate. Aortic regurgitation PHT measures 227 msec. Mild to moderate aortic stenosis is present. Aortic valve mean gradient measures 20.2 mmHg. Aortic valve peak gradient measures 33.8 mmHg. Aortic valve area, by VTI measures 1.52 cm. Pulmonic Valve: The pulmonic valve was not well visualized. Pulmonic valve regurgitation is not visualized. Aorta: The aortic root is normal in size and structure. Venous: The inferior vena cava is normal in size with less than 50% respiratory variability, suggesting right atrial pressure of 8 mmHg. IAS/Shunts: No atrial level shunt detected by color flow Doppler.  LEFT VENTRICLE PLAX 2D LVIDd:         6.00 cm      Diastology LVIDs:         4.80 cm      LV e' medial:    5.77 cm/s LV PW:         1.30 cm      LV E/e' medial:  13.7 LV IVS:        1.20 cm      LV e' lateral:   7.18 cm/s LVOT diam:     2.20 cm      LV E/e' lateral: 11.0 LV SV:         82 LV SV Index:   38           2D Longitudinal Strain LVOT Area:     3.80 cm     2D Strain GLS Avg:     -6.4 %  LV Volumes (MOD) LV vol d, MOD A2C: 214.0 ml 3D Volume EF: LV vol d, MOD A4C: 242.0 ml 3D EF:        36 % LV vol s, MOD A2C: 140.5 ml LV EDV:       327 ml LV vol s, MOD A4C: 156.0 ml LV ESV:       209 ml LV SV MOD A2C:     73.5 ml  LV SV:        119 ml LV SV MOD A4C:     242.0 ml LV SV MOD BP:      83.8 ml RIGHT VENTRICLE RV Basal diam:   4.60 cm TAPSE (M-mode): 1.8 cm LEFT ATRIUM           Index       RIGHT ATRIUM           Index LA diam:      5.10 cm 2.37 cm/m  RA Area:     24.50 cm  LA Vol (A2C): 67.2 ml 31.19 ml/m RA Volume:   89.40 ml  41.50 ml/m LA Vol (A4C): 40.2 ml 18.66 ml/m  AORTIC VALVE                    PULMONIC VALVE AV Area (Vmax):    1.64 cm     PV Vmax:       1.03 m/s AV Area (Vmean):   1.37 cm     PV Peak grad:  4.2 mmHg AV Area (VTI):     1.52 cm AV Vmax:           290.50 cm/s AV Vmean:          211.750 cm/s AV VTI:            0.539 m AV Peak Grad:      33.8 mmHg AV Mean Grad:      20.2 mmHg LVOT Vmax:         125.00 cm/s LVOT Vmean:        76.500 cm/s LVOT VTI:          0.216 m LVOT/AV VTI ratio: 0.40 AI PHT:            227 msec  AORTA Ao Root diam: 3.70 cm Ao Asc diam:  3.80 cm MITRAL VALVE                TRICUSPID VALVE MV Area (PHT): 5.02 cm     TV Peak grad:   32.1 mmHg MV Decel Time: 151 msec     TV Vmax:        2.84 m/s MV E velocity: 78.80 cm/s MV A velocity: 113.00 cm/s  SHUNTS MV E/A ratio:  0.70         Systemic VTI:  0.22 m                             Systemic Diam: 2.20 cm Kate Sable MD Electronically signed by Kate Sable MD Signature Date/Time: 08/13/2021/3:47:36 PM    Final    ECHO TEE  Result Date: 08/16/2021    TRANSESOPHOGEAL ECHO REPORT   Patient Name:   [name] [name] Date of Exam: 08/16/2021 Medical Rec #:  [id number]     Height:       73.0 in Accession #:    [id number]    Weight:       200.6 lb Date of Birth:  [birth year]     BSA:          2.154 m Patient Age:    46 years      BP:           134/78 mmHg Patient Gender: M             HR:           99 bpm. Exam Location:  [facility] Procedure: Transesophageal Echo and Color Doppler Indications:     Endocarditis  History:         Patient has prior history of Echocardiogram examinations, most                  recent 08/13/2021. ESRD; Risk Factors:Hypertension and Current                  Smoker. Hx of polysubstance abuse.  Sonographer:     Charmayne Sheer  Referring Phys:  [place] Diagnosing Phys: Neoma Laming PROCEDURE: The transesophogeal  probe was passed without difficulty through the esophogus of the patient. Sedation performed by performing physician. The patient developed no complications during the procedure. IMPRESSIONS  1. Left ventricular ejection fraction, by estimation, is 55 to 60%. The left ventricle has normal function. The left ventricular internal cavity size was mildly dilated.  2. Right ventricular systolic function is mildly reduced. The right ventricular size is mildly enlarged.  3. Left atrial size was moderately dilated. No left atrial/left atrial appendage thrombus was detected.  4. Right atrial size was mild to moderately dilated.  5. 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  6. 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  7. 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 The aortic valve is abnormal. Aortic valve regurgitation is severe. Mild to moderate aortic valve sclerosis/calcification is present, without any evidence of aortic stenosis. Conclusion(s)/Recommendation(s): Findings are concerning for vegetation/infective endocarditis as detailed above. Findings concerning for aortic valve vegetation. FINDINGS  Left Ventricle: Left ventricular ejection fraction, by estimation, is 55 to 60%. The left ventricle has normal function. The left ventricular internal cavity size was mildly dilated. Right Ventricle: The right ventricular size is mildly enlarged. No increase in right ventricular wall thickness. Right ventricular systolic function is mildly reduced. Left Atrium: Left atrial size was moderately dilated. No left atrial/left atrial appendage thrombus was detected. Right Atrium: Right atrial size was mild to moderately dilated. Pericardium: There is no evidence of pericardial effusion. Mitral Valve: The mitral valve is myxomatous. Moderate to severe  mitral valve regurgitation. 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 The aortic valve is abnormal. Aortic valve regurgitation is severe. Mild to moderate aortic valve sclerosis/calcification is present, without any evidence of aortic stenosis. Pulmonic Valve: The pulmonic valve was normal in structure. Pulmonic valve regurgitation is not visualized. Aorta: The aortic root was not well visualized. IAS/Shunts: No atrial level shunt detected by color flow Doppler. Neoma Laming Electronically signed by Neoma Laming Signature Date/Time: 08/16/2021/9:28:16 AM    Final       Subjective: Patient seen and examined at bedside.  Poor historian.  No overnight fever, vomiting or agitation reported.  Discharge Exam: Vitals:   08/21/21 0355 08/21/21 0826  BP: 131/71 (!) 128/51  Pulse: 97 (!) 102  Resp: 16 20  Temp: 97.8 F (36.6 C) 98.3 F (36.8 C)  SpO2: 96% 97%    General: Looks chronically ill and deconditioned.  Awake, very poor historian.  Flat affect.  Does not participate in conversation much.  Intermittently on 2 L oxygen by nasal cannula  cardiovascular: Mildly tachycardic intermittently, S1/S2 + Respiratory: bilateral decreased breath sounds at bases with scattered crackles Abdominal: Soft, NT, ND, bowel sounds + Extremities: Trace lower extremity edema present; no cyanosis    The results of significant diagnostics from this hospitalization (including imaging, microbiology, ancillary and laboratory) are listed below for reference.     Microbiology: Recent Results (from the past 240 hour(s))  Culture, blood (Routine X 2) w Reflex to ID Panel     Status: Abnormal   Collection Time: 08/11/21  5:47 PM   Specimen: BLOOD  Result Value Ref Range Status   Specimen Description   Final    BLOOD LEFT HAND Performed at [facility], [address], [place], [place]  [address]    Special Requests   Final    BOTTLES DRAWN AEROBIC AND  ANAEROBIC Blood Culture adequate volume Performed at [facility], [address] [place], [place] [address]    Culture  Setup Time   Final    GRAM POSITIVE COCCI IN CHAINS IN BOTH AEROBIC AND ANAEROBIC BOTTLES Gram Stain Report Called to,Read Back By and Verified With: JASON ROBBINS 08/12/21 @ 0507 BY SB Performed at [facility], [address] [place], [place] [address]    Culture (A)  Final    ENTEROCOCCUS FAECALIS SUSCEPTIBILITIES PERFORMED ON PREVIOUS CULTURE WITHIN THE LAST 5 DAYS. Performed at [facility], [place] [address] [place], [place] [address]    Report Status 08/14/2021 FINAL  Final  Culture, blood (Routine X 2) w Reflex to ID Panel     Status: Abnormal   Collection Time: 08/11/21  5:47 PM   Specimen: BLOOD  Result Value Ref Range Status   Specimen Description   Final    BLOOD FOA Performed at [facility], [address], [place], [place] [address]    Special Requests   Final    BOTTLES DRAWN AEROBIC AND ANAEROBIC Blood Culture adequate volume Performed at [facility], [address] [place], [place] [address]    Culture  Setup Time   Final    GRAM POSITIVE COCCI IN CHAINS IN BOTH AEROBIC AND ANAEROBIC BOTTLES Gram Stain Report Called to,Read Back By and Verified With: JASON ROBBINS 08/12/21 @ 0507 BY SB Performed at [facility], [place] [address], [place], [place] [address]    Culture ENTEROCOCCUS FAECALIS (A)  Final   Report Status 08/14/2021 FINAL  Final   Organism ID, Bacteria ENTEROCOCCUS FAECALIS  Final      Susceptibility   Enterococcus faecalis - MIC*    AMPICILLIN <=2 SENSITIVE Sensitive     VANCOMYCIN 2 SENSITIVE Sensitive     GENTAMICIN SYNERGY SENSITIVE Sensitive     * ENTEROCOCCUS FAECALIS  Blood Culture ID Panel (Reflexed)     Status: Abnormal   Collection Time: 08/11/21  5:47 PM  Result Value Ref Range Status   Enterococcus  faecalis DETECTED (A) NOT DETECTED Final    Comment: CRITICAL RESULT CALLED TO, READ BACK BY AND VERIFIED WITH: JASON ROBBINS 08/12/21 @ 0507 BY SB    Enterococcus Faecium NOT DETECTED NOT DETECTED Final   Listeria monocytogenes NOT DETECTED NOT DETECTED Final   Staphylococcus species NOT DETECTED NOT DETECTED Final   Staphylococcus aureus (BCID) NOT DETECTED NOT DETECTED Final   Staphylococcus epidermidis NOT DETECTED NOT DETECTED Final   Staphylococcus lugdunensis NOT DETECTED NOT DETECTED Final   Streptococcus species NOT DETECTED NOT DETECTED Final   Streptococcus agalactiae NOT DETECTED NOT DETECTED Final   Streptococcus pneumoniae NOT DETECTED NOT DETECTED Final   Streptococcus pyogenes NOT DETECTED NOT DETECTED Final   A.calcoaceticus-baumannii NOT DETECTED NOT DETECTED Final   Bacteroides fragilis NOT DETECTED NOT DETECTED Final   Enterobacterales NOT DETECTED NOT DETECTED Final   Enterobacter cloacae complex NOT DETECTED NOT DETECTED Final   Escherichia coli NOT DETECTED NOT DETECTED Final   Klebsiella aerogenes NOT DETECTED NOT DETECTED Final   Klebsiella oxytoca NOT DETECTED NOT DETECTED Final   Klebsiella pneumoniae NOT DETECTED NOT DETECTED Final   Proteus species NOT DETECTED NOT DETECTED Final   Salmonella species NOT DETECTED NOT DETECTED Final   Serratia marcescens NOT DETECTED NOT DETECTED Final   Haemophilus influenzae NOT DETECTED NOT DETECTED Final   Neisseria meningitidis NOT DETECTED NOT DETECTED Final   Pseudomonas aeruginosa NOT DETECTED NOT DETECTED  Final   Stenotrophomonas maltophilia NOT DETECTED NOT DETECTED Final   Candida albicans NOT DETECTED NOT DETECTED Final   Candida auris NOT DETECTED NOT DETECTED Final   Candida glabrata NOT DETECTED NOT DETECTED Final   Candida krusei NOT DETECTED NOT DETECTED Final   Candida parapsilosis NOT DETECTED NOT DETECTED Final   Candida tropicalis NOT DETECTED NOT DETECTED Final   Cryptococcus neoformans/gattii NOT  DETECTED NOT DETECTED Final   Vancomycin resistance NOT DETECTED NOT DETECTED Final    Comment: Performed at [facility], [address] [place], [place] [address]  CULTURE, BLOOD (ROUTINE X 2) w Reflex to ID Panel     Status: None   Collection Time: 08/12/21 11:58 PM   Specimen: BLOOD  Result Value Ref Range Status   Specimen Description BLOOD BLOOD LEFT HAND  Final   Special Requests   Final    BOTTLES DRAWN AEROBIC AND ANAEROBIC Blood Culture adequate volume   Culture   Final    NO GROWTH 5 DAYS Performed at [facility], [address] [place], [place] [address]    Report Status 08/18/2021 FINAL  Final  CULTURE, BLOOD (ROUTINE X 2) w Reflex to ID Panel     Status: None   Collection Time: 08/12/21 11:58 PM   Specimen: BLOOD  Result Value Ref Range Status   Specimen Description BLOOD LEFT RADIAL  Final   Special Requests   Final    BOTTLES DRAWN AEROBIC AND ANAEROBIC Blood Culture results may not be optimal due to an inadequate volume of blood received in culture bottles   Culture   Final    NO GROWTH 5 DAYS Performed at [facility], [address] [place], [place] [address]    Report Status 08/18/2021 FINAL  Final     Labs: BNP (last 3 results) No results for input(s): BNP in the last 8760 hours. Basic Metabolic Panel: Recent Labs  Lab 08/15/21 0446 08/16/21 0429 08/17/21 0449 08/18/21 0416 08/19/21 0433 08/21/21 0630  NA 136 136 135 136 138 141  K 4.0 4.2 3.9 4.7 4.8 5.0  CL 96* 96* 96* 95* 96* 99  CO2 29 28 30 27 28 29   GLUCOSE 102* 96 95 86 85 103*  BUN 46* 58* 38* 50* 65* 63*  CREATININE 7.01* 8.68* 7.01* 8.54* 9.93* 9.88*  CALCIUM 10.1 10.9* 10.2 11.1* 10.8* 10.6*  MG 1.9 1.9 1.9 2.1 2.3  --   PHOS  --  6.2*  --   --   --  7.7*   Liver Function Tests: No results for input(s): AST, ALT, ALKPHOS, BILITOT, PROT, ALBUMIN in the last 168 hours. No results for input(s): LIPASE, AMYLASE in the last 168 hours. No results for  input(s): AMMONIA in the last 168 hours. CBC: Recent Labs  Lab 08/16/21 0429 08/17/21 0449 08/18/21 0416 08/19/21 0433 08/21/21 0630  WBC 8.6 7.7 9.0 8.6 10.4  HGB 8.8* 7.8* 8.0* 8.7* 8.3*  HCT 28.1* 24.5* 26.2* 27.3* 28.2*  MCV 95.6 96.1 94.6 98.6 97.9  PLT 211 195 215 233 192   Cardiac Enzymes: No results for input(s): CKTOTAL, CKMB, CKMBINDEX, TROPONINI in the last 168 hours. BNP: Invalid input(s): POCBNP CBG: Recent Labs  Lab 08/14/21 1639  GLUCAP 346*   D-Dimer No results for input(s): DDIMER in the last 72 hours. Hgb A1c No results for input(s): HGBA1C in the last 72 hours. Lipid Profile No results for input(s): CHOL, HDL, LDLCALC, TRIG, CHOLHDL, LDLDIRECT in the last 72 hours. Thyroid  function studies No results for input(s): TSH, T4TOTAL, T3FREE, THYROIDAB in the last 72 hours.  Invalid input(s): FREET3 Anemia work up No results for input(s): VITAMINB12, FOLATE, FERRITIN, TIBC, IRON, RETICCTPCT in the last 72 hours. Urinalysis No results found for: COLORURINE, APPEARANCEUR, [place], [place], GLUCOSEU, [place], [place], [place], [place], UROBILINOGEN, NITRITE, LEUKOCYTESUR Sepsis Labs Invalid input(s): PROCALCITONIN,  WBC,  LACTICIDVEN Microbiology Recent Results (from the past 240 hour(s))  Culture, blood (Routine X 2) w Reflex to ID Panel     Status: Abnormal   Collection Time: 08/11/21  5:47 PM   Specimen: BLOOD  Result Value Ref Range Status   Specimen Description   Final    BLOOD LEFT HAND Performed at [facility], [address] [place], [place] [address]    Special Requests   Final    BOTTLES DRAWN AEROBIC AND ANAEROBIC Blood Culture adequate volume Performed at [facility], [address] [place], [place] [address]    Culture  Setup Time   Final    GRAM POSITIVE COCCI IN CHAINS IN BOTH AEROBIC AND ANAEROBIC BOTTLES Gram Stain Report Called to,Read Back By and Verified With: JASON ROBBINS 08/12/21 @ 0507 BY  SB Performed at [facility], [address] [place], [place] [address]    Culture (A)  Final    ENTEROCOCCUS FAECALIS SUSCEPTIBILITIES PERFORMED ON PREVIOUS CULTURE WITHIN THE LAST 5 DAYS. Performed at [facility], [place] [address], [place], [place] [address]    Report Status 08/14/2021 FINAL  Final  Culture, blood (Routine X 2) w Reflex to ID Panel     Status: Abnormal   Collection Time: 08/11/21  5:47 PM   Specimen: BLOOD  Result Value Ref Range Status   Specimen Description   Final    BLOOD FOA Performed at [facility], [address] [place], [place] [address]    Special Requests   Final    BOTTLES DRAWN AEROBIC AND ANAEROBIC Blood Culture adequate volume Performed at [facility], [address] [place], [place] [address]    Culture  Setup Time   Final    GRAM POSITIVE COCCI IN CHAINS IN BOTH AEROBIC AND ANAEROBIC BOTTLES Gram Stain Report Called to,Read Back By and Verified With: JASON ROBBINS 08/12/21 @ 0507 BY SB Performed at [facility], [place] [address], [place],  [address]    Culture ENTEROCOCCUS FAECALIS (A)  Final   Report Status 08/14/2021 FINAL  Final   Organism ID, Bacteria ENTEROCOCCUS FAECALIS  Final      Susceptibility   Enterococcus faecalis - MIC*    AMPICILLIN <=2 SENSITIVE Sensitive     VANCOMYCIN 2 SENSITIVE Sensitive     GENTAMICIN SYNERGY SENSITIVE Sensitive     * ENTEROCOCCUS FAECALIS  Blood Culture ID Panel (Reflexed)     Status: Abnormal   Collection Time: 08/11/21  5:47 PM  Result Value Ref Range Status   Enterococcus faecalis DETECTED (A) NOT DETECTED Final    Comment: CRITICAL RESULT CALLED TO, READ BACK BY AND VERIFIED WITH: JASON ROBBINS 08/12/21 @ 0507 BY SB    Enterococcus Faecium NOT DETECTED NOT DETECTED Final   Listeria monocytogenes NOT DETECTED NOT DETECTED Final   Staphylococcus species NOT DETECTED NOT DETECTED Final   Staphylococcus aureus (BCID) NOT DETECTED NOT DETECTED Final    Staphylococcus epidermidis NOT DETECTED NOT DETECTED Final   Staphylococcus lugdunensis NOT DETECTED NOT DETECTED Final   Streptococcus species NOT DETECTED NOT DETECTED Final   Streptococcus agalactiae  NOT DETECTED NOT DETECTED Final   Streptococcus pneumoniae NOT DETECTED NOT DETECTED Final   Streptococcus pyogenes NOT DETECTED NOT DETECTED Final   A.calcoaceticus-baumannii NOT DETECTED NOT DETECTED Final   Bacteroides fragilis NOT DETECTED NOT DETECTED Final   Enterobacterales NOT DETECTED NOT DETECTED Final   Enterobacter cloacae complex NOT DETECTED NOT DETECTED Final   Escherichia coli NOT DETECTED NOT DETECTED Final   Klebsiella aerogenes NOT DETECTED NOT DETECTED Final   Klebsiella oxytoca NOT DETECTED NOT DETECTED Final   Klebsiella pneumoniae NOT DETECTED NOT DETECTED Final   Proteus species NOT DETECTED NOT DETECTED Final   Salmonella species NOT DETECTED NOT DETECTED Final   Serratia marcescens NOT DETECTED NOT DETECTED Final   Haemophilus influenzae NOT DETECTED NOT DETECTED Final   Neisseria meningitidis NOT DETECTED NOT DETECTED Final   Pseudomonas aeruginosa NOT DETECTED NOT DETECTED Final   Stenotrophomonas maltophilia NOT DETECTED NOT DETECTED Final   Candida albicans NOT DETECTED NOT DETECTED Final   Candida auris NOT DETECTED NOT DETECTED Final   Candida glabrata NOT DETECTED NOT DETECTED Final   Candida krusei NOT DETECTED NOT DETECTED Final   Candida parapsilosis NOT DETECTED NOT DETECTED Final   Candida tropicalis NOT DETECTED NOT DETECTED Final   Cryptococcus neoformans/gattii NOT DETECTED NOT DETECTED Final   Vancomycin resistance NOT DETECTED NOT DETECTED Final    Comment: Performed at [facility], [address] [place], [place] [address]  CULTURE, BLOOD (ROUTINE X 2) w Reflex to ID Panel     Status: None   Collection Time: 08/12/21 11:58 PM   Specimen: BLOOD  Result Value Ref Range Status   Specimen Description BLOOD BLOOD LEFT HAND  Final    Special Requests   Final    BOTTLES DRAWN AEROBIC AND ANAEROBIC Blood Culture adequate volume   Culture   Final    NO GROWTH 5 DAYS Performed at [facility], [address] [place], [place] [address]    Report Status 08/18/2021 FINAL  Final  CULTURE, BLOOD (ROUTINE X 2) w Reflex to ID Panel     Status: None   Collection Time: 08/12/21 11:58 PM   Specimen: BLOOD  Result Value Ref Range Status   Specimen Description BLOOD LEFT RADIAL  Final   Special Requests   Final    BOTTLES DRAWN AEROBIC AND ANAEROBIC Blood Culture results may not be optimal due to an inadequate volume of blood received in culture bottles   Culture   Final    NO GROWTH 5 DAYS Performed at [facility], [address], [place], [place] [address]    Report Status 08/18/2021 FINAL  Final     Time coordinating discharge: 35 minutes  SIGNED:   Aline August, MD  [facility] 08/21/2021, 9:41 AM

## 2021-08-21 NOTE — TOC Progression Note (Signed)
Transition of Care [facility]) - Progression Note    Patient Details  Name: VEER ELAMIN MRN: [id number] Date of Birth: Apr 15, 1961  Transition of Care [facility]) CM/SW [place], RN Phone Number: 08/21/2021, 10:01 AM  Clinical Narrative:   To Discharge to [place] today via Transport by first choice at 4 PM  Called to get his chair time to send to [place] summary thru the hub to [employer], awaiting room/bed number       Expected Discharge Plan and Services           Expected Discharge Date: 08/21/21                                     Social Determinants of Health (SDOH) Interventions    Readmission Risk Interventions No flowsheet data found.

## 2021-08-21 NOTE — Progress Notes (Signed)
Pt seemed to had some confusion whenever questions was asked and c/o pain in his back, RN and doctor aware. Pt completed a net UF goal of 1500.

## 2021-08-21 NOTE — Progress Notes (Signed)
OT Cancellation Note  Patient Details Name: [name] [name] MRN: [id number] DOB: [birth year]   Cancelled Treatment:    Reason Eval/Treat Not Completed: Patient at procedure or test/ unavailable. Pt at HD this AM. Will attempt OT session at later time/date, as pt is available, willing, and medically appropriate.  Josiah Lobo, PhD, MS, OTR/L 08/21/21, 10:04 AM

## 2021-08-21 NOTE — TOC Progression Note (Signed)
Transition of Care [facility]) - Progression Note    Patient Details  Name: [name] [name] MRN: [id number] Date of Birth: 21-Feb-1961  Transition of Care [facility]) CM/SW [place], RN Phone Number: 08/21/2021, 3:46 PM  Clinical Narrative:    Damaris Schooner with [facility] and let them know we do not have a chair time yet, he has not sat up for Dialysis therefore will not be able to DC yet, He is encouraged to work with PT        Expected Discharge Plan and Services           Expected Discharge Date: 08/21/21                                     Social Determinants of Health (SDOH) Interventions    Readmission Risk Interventions No flowsheet data found.

## 2021-08-21 NOTE — Progress Notes (Signed)
Spoke with Katrina in central telemetry at [place] to transfer patient from room 144 to [facility].

## 2021-08-21 NOTE — Care Management Important Message (Signed)
Important Message  Patient Details  Name: [name] [name] MRN: [id number] Date of Birth: 1961/04/14   Medicare Important Message Given:  Yes     Dannette Barbara 08/21/2021, 11:48 AM

## 2021-08-21 NOTE — Progress Notes (Signed)
[facility]  ROUNDING NOTE   Subjective:   Seen and examined on hemodialysis treatment. Laying in bed.     HEMODIALYSIS FLOWSHEET:  Blood Flow Rate (mL/min): 400 mL/min Arterial Pressure (mmHg): -120 mmHg Venous Pressure (mmHg): 210 mmHg Transmembrane Pressure (mmHg): 60 mmHg Ultrafiltration Rate (mL/min): 670 mL/min Dialysate Flow Rate (mL/min): 500 ml/min Conductivity: Machine : 13.7 Conductivity: Machine : 13.7 Dialysis Fluid Bolus: Normal Saline Bolus Amount (mL): 250 mL    Objective:  Vital signs in last 24 hours:  Temp:  [97.8 F (36.6 C)-98.6 F (37 C)] 98.6 F (37 C) (09/12 1014) Pulse Rate:  [93-109] 93 (09/12 1014) Resp:  [6-22] 6 (09/12 1145) BP: (105-140)/(51-75) 105/52 (09/12 1145) SpO2:  [96 %-100 %] 99 % (09/12 1014)  Weight change:  Filed Weights   08/08/21 0625 08/16/21 0754  Weight: 91 kg 91 kg    Intake/Output: I/O last 3 completed shifts: In: 1908.3 [I.V.:8.3; IV Piggyback:1900] Out: 0    Intake/Output this shift:  No intake/output data recorded.  Physical Exam: General: NAD, resting in bed  Head: Normocephalic, atraumatic. Moist oral mucosal membranes  Eyes: Anicteric  Lungs:  Clear to auscultation, normal effort  Heart: Regular rate and rhythm  Abdomen:  Soft, nontender, distended  Extremities: No edema  Neurologic: Alert, moving all four extremities  Skin: No lesions  Access: Rt AVG    Basic Metabolic Panel: Recent Labs  Lab 08/15/21 0446 08/16/21 0429 08/17/21 0449 08/18/21 0416 08/19/21 0433 08/21/21 0630  NA 136 136 135 136 138 141  K 4.0 4.2 3.9 4.7 4.8 5.0  CL 96* 96* 96* 95* 96* 99  CO2 29 28 30 27 28 29   GLUCOSE 102* 96 95 86 85 103*  BUN 46* 58* 38* 50* 65* 63*  CREATININE 7.01* 8.68* 7.01* 8.54* 9.93* 9.88*  CALCIUM 10.1 10.9* 10.2 11.1* 10.8* 10.6*  MG 1.9 1.9 1.9 2.1 2.3  --   PHOS  --  6.2*  --   --   --  7.7*     Liver Function Tests: No results for input(s): AST, ALT, ALKPHOS,  BILITOT, PROT, ALBUMIN in the last 168 hours.  No results for input(s): LIPASE, AMYLASE in the last 168 hours. No results for input(s): AMMONIA in the last 168 hours.  CBC: Recent Labs  Lab 08/16/21 0429 08/17/21 0449 08/18/21 0416 08/19/21 0433 08/21/21 0630  WBC 8.6 7.7 9.0 8.6 10.4  HGB 8.8* 7.8* 8.0* 8.7* 8.3*  HCT 28.1* 24.5* 26.2* 27.3* 28.2*  MCV 95.6 96.1 94.6 98.6 97.9  PLT 211 195 215 233 192     Cardiac Enzymes: No results for input(s): CKTOTAL, CKMB, CKMBINDEX, TROPONINI in the last 168 hours.  BNP: Invalid input(s): POCBNP  CBG: Recent Labs  Lab 08/14/21 1639  GLUCAP 346*     Microbiology: Results for orders placed or performed during the hospital encounter of 08/08/21  Resp Panel by RT-PCR (Flu A&B, Covid) Nasopharyngeal Swab     Status: None   Collection Time: 08/08/21  1:22 PM   Specimen: Nasopharyngeal Swab; Nasopharyngeal(NP) swabs in vial transport medium  Result Value Ref Range Status   SARS Coronavirus 2 by RT PCR NEGATIVE NEGATIVE Final    Comment: (NOTE) SARS-CoV-2 target nucleic acids are NOT DETECTED.  The SARS-CoV-2 RNA is generally detectable in upper respiratory specimens during the acute phase of infection. The lowest concentration of SARS-CoV-2 viral copies this assay can detect is 138 copies/mL. A negative result does not preclude SARS-Cov-2 infection and should not  be used as the sole basis for treatment or other patient management decisions. A negative result may occur with  improper specimen collection/handling, submission of specimen other than nasopharyngeal swab, presence of viral mutation(s) within the areas targeted by this assay, and inadequate number of viral copies(<138 copies/mL). A negative result must be combined with clinical observations, patient history, and epidemiological information. The expected result is Negative.  Fact Sheet for Patients:  EntrepreneurPulse.com.au  Fact Sheet for  Healthcare Providers:  IncredibleEmployment.be  This test is no t yet approved or cleared by the [place] FDA and  has been authorized for detection and/or diagnosis of SARS-CoV-2 by FDA under an Emergency Use Authorization (EUA). This EUA will remain  in effect (meaning this test can be used) for the duration of the COVID-19 declaration under Section 564(b)(1) of the Act, 21 U.S.C.section 360bbb-3(b)(1), unless the authorization is terminated  or revoked sooner.       Influenza A by PCR NEGATIVE NEGATIVE Final   Influenza B by PCR NEGATIVE NEGATIVE Final    Comment: (NOTE) The Xpert Xpress SARS-CoV-2/FLU/RSV plus assay is intended as an aid in the diagnosis of influenza from Nasopharyngeal swab specimens and should not be used as a sole basis for treatment. Nasal washings and aspirates are unacceptable for Xpert Xpress SARS-CoV-2/FLU/RSV testing.  Fact Sheet for Patients: EntrepreneurPulse.com.au  Fact Sheet for Healthcare Providers: IncredibleEmployment.be  This test is not yet approved or cleared by the [place] FDA and has been authorized for detection and/or diagnosis of SARS-CoV-2 by FDA under an Emergency Use Authorization (EUA). This EUA will remain in effect (meaning this test can be used) for the duration of the COVID-19 declaration under Section 564(b)(1) of the Act, 21 U.S.C. section 360bbb-3(b)(1), unless the authorization is terminated or revoked.  Performed at [facility], [address] [place], [place] [address]   Culture, blood (Routine X 2) w Reflex to ID Panel     Status: Abnormal   Collection Time: 08/11/21  5:47 PM   Specimen: BLOOD  Result Value Ref Range Status   Specimen Description   Final    BLOOD LEFT HAND Performed at [facility], [address], [place], [place] [address]    Special Requests   Final    BOTTLES DRAWN AEROBIC AND ANAEROBIC Blood Culture  adequate volume Performed at [facility], [address] [place], [place] [address]    Culture  Setup Time   Final    GRAM POSITIVE COCCI IN CHAINS IN BOTH AEROBIC AND ANAEROBIC BOTTLES Gram Stain Report Called to,Read Back By and Verified With: JASON ROBBINS 08/12/21 @ 0507 BY SB Performed at [facility], [address] [place], [place] [address]    Culture (A)  Final    ENTEROCOCCUS FAECALIS SUSCEPTIBILITIES PERFORMED ON PREVIOUS CULTURE WITHIN THE LAST 5 DAYS. Performed at [facility], [address] [place], [place] [address]    Report Status 08/14/2021 FINAL  Final  Culture, blood (Routine X 2) w Reflex to ID Panel     Status: Abnormal   Collection Time: 08/11/21  5:47 PM   Specimen: BLOOD  Result Value Ref Range Status   Specimen Description   Final    BLOOD FOA Performed at [facility], [address] [place], [place] [address]    Special Requests   Final    BOTTLES DRAWN AEROBIC AND ANAEROBIC Blood Culture adequate volume Performed at [facility], [address], [place], [place] [address]  Culture  Setup Time   Final    GRAM POSITIVE COCCI IN CHAINS IN BOTH AEROBIC AND ANAEROBIC BOTTLES Gram Stain Report Called to,Read Back By and Verified With: JASON ROBBINS 08/12/21 @ 0507 BY SB Performed at [facility], [place] [address] [place], [place] [address]    Culture ENTEROCOCCUS FAECALIS (A)  Final   Report Status 08/14/2021 FINAL  Final   Organism ID, Bacteria ENTEROCOCCUS FAECALIS  Final      Susceptibility   Enterococcus faecalis - MIC*    AMPICILLIN <=2 SENSITIVE Sensitive     VANCOMYCIN 2 SENSITIVE Sensitive     GENTAMICIN SYNERGY SENSITIVE Sensitive     * ENTEROCOCCUS FAECALIS  Blood Culture ID Panel (Reflexed)     Status: Abnormal   Collection Time: 08/11/21  5:47 PM  Result Value Ref Range Status   Enterococcus faecalis DETECTED (A) NOT DETECTED Final    Comment: CRITICAL RESULT CALLED TO, READ BACK BY  AND VERIFIED WITH: JASON ROBBINS 08/12/21 @ 0507 BY SB    Enterococcus Faecium NOT DETECTED NOT DETECTED Final   Listeria monocytogenes NOT DETECTED NOT DETECTED Final   Staphylococcus species NOT DETECTED NOT DETECTED Final   Staphylococcus aureus (BCID) NOT DETECTED NOT DETECTED Final   Staphylococcus epidermidis NOT DETECTED NOT DETECTED Final   Staphylococcus lugdunensis NOT DETECTED NOT DETECTED Final   Streptococcus species NOT DETECTED NOT DETECTED Final   Streptococcus agalactiae NOT DETECTED NOT DETECTED Final   Streptococcus pneumoniae NOT DETECTED NOT DETECTED Final   Streptococcus pyogenes NOT DETECTED NOT DETECTED Final   A.calcoaceticus-baumannii NOT DETECTED NOT DETECTED Final   Bacteroides fragilis NOT DETECTED NOT DETECTED Final   Enterobacterales NOT DETECTED NOT DETECTED Final   Enterobacter cloacae complex NOT DETECTED NOT DETECTED Final   Escherichia coli NOT DETECTED NOT DETECTED Final   Klebsiella aerogenes NOT DETECTED NOT DETECTED Final   Klebsiella oxytoca NOT DETECTED NOT DETECTED Final   Klebsiella pneumoniae NOT DETECTED NOT DETECTED Final   Proteus species NOT DETECTED NOT DETECTED Final   Salmonella species NOT DETECTED NOT DETECTED Final   Serratia marcescens NOT DETECTED NOT DETECTED Final   Haemophilus influenzae NOT DETECTED NOT DETECTED Final   Neisseria meningitidis NOT DETECTED NOT DETECTED Final   Pseudomonas aeruginosa NOT DETECTED NOT DETECTED Final   Stenotrophomonas maltophilia NOT DETECTED NOT DETECTED Final   Candida albicans NOT DETECTED NOT DETECTED Final   Candida auris NOT DETECTED NOT DETECTED Final   Candida glabrata NOT DETECTED NOT DETECTED Final   Candida krusei NOT DETECTED NOT DETECTED Final   Candida parapsilosis NOT DETECTED NOT DETECTED Final   Candida tropicalis NOT DETECTED NOT DETECTED Final   Cryptococcus neoformans/gattii NOT DETECTED NOT DETECTED Final   Vancomycin resistance NOT DETECTED NOT DETECTED Final    Comment:  Performed at [facility], [address] [place], [place] [address]  CULTURE, BLOOD (ROUTINE X 2) w Reflex to ID Panel     Status: None   Collection Time: 08/12/21 11:58 PM   Specimen: BLOOD  Result Value Ref Range Status   Specimen Description BLOOD BLOOD LEFT HAND  Final   Special Requests   Final    BOTTLES DRAWN AEROBIC AND ANAEROBIC Blood Culture adequate volume   Culture   Final    NO GROWTH 5 DAYS Performed at [facility], [address] [place], [place] [address]    Report Status 08/18/2021 FINAL  Final  CULTURE, BLOOD (ROUTINE X 2) w Reflex to ID Panel  Status: None   Collection Time: 08/12/21 11:58 PM   Specimen: BLOOD  Result Value Ref Range Status   Specimen Description BLOOD LEFT RADIAL  Final   Special Requests   Final    BOTTLES DRAWN AEROBIC AND ANAEROBIC Blood Culture results may not be optimal due to an inadequate volume of blood received in culture bottles   Culture   Final    NO GROWTH 5 DAYS Performed at [facility], [address] [place], [place] [address]    Report Status 08/18/2021 FINAL  Final  Resp Panel by RT-PCR (Flu A&B, Covid) Nasopharyngeal Swab     Status: None   Collection Time: 08/21/21  9:50 AM   Specimen: Nasopharyngeal Swab; Nasopharyngeal(NP) swabs in vial transport medium  Result Value Ref Range Status   SARS Coronavirus 2 by RT PCR NEGATIVE NEGATIVE Final    Comment: (NOTE) SARS-CoV-2 target nucleic acids are NOT DETECTED.  The SARS-CoV-2 RNA is generally detectable in upper respiratory specimens during the acute phase of infection. The lowest concentration of SARS-CoV-2 viral copies this assay can detect is 138 copies/mL. A negative result does not preclude SARS-Cov-2 infection and should not be used as the sole basis for treatment or other patient management decisions. A negative result may occur with  improper specimen collection/handling, submission of specimen other than nasopharyngeal swab,  presence of viral mutation(s) within the areas targeted by this assay, and inadequate number of viral copies(<138 copies/mL). A negative result must be combined with clinical observations, patient history, and epidemiological information. The expected result is Negative.  Fact Sheet for Patients:  EntrepreneurPulse.com.au  Fact Sheet for Healthcare Providers:  IncredibleEmployment.be  This test is no t yet approved or cleared by the [place] FDA and  has been authorized for detection and/or diagnosis of SARS-CoV-2 by FDA under an Emergency Use Authorization (EUA). This EUA will remain  in effect (meaning this test can be used) for the duration of the COVID-19 declaration under Section 564(b)(1) of the Act, 21 U.S.C.section 360bbb-3(b)(1), unless the authorization is terminated  or revoked sooner.       Influenza A by PCR NEGATIVE NEGATIVE Final   Influenza B by PCR NEGATIVE NEGATIVE Final    Comment: (NOTE) The Xpert Xpress SARS-CoV-2/FLU/RSV plus assay is intended as an aid in the diagnosis of influenza from Nasopharyngeal swab specimens and should not be used as a sole basis for treatment. Nasal washings and aspirates are unacceptable for Xpert Xpress SARS-CoV-2/FLU/RSV testing.  Fact Sheet for Patients: EntrepreneurPulse.com.au  Fact Sheet for Healthcare Providers: IncredibleEmployment.be  This test is not yet approved or cleared by the [place] FDA and has been authorized for detection and/or diagnosis of SARS-CoV-2 by FDA under an Emergency Use Authorization (EUA). This EUA will remain in effect (meaning this test can be used) for the duration of the COVID-19 declaration under Section 564(b)(1) of the Act, 21 U.S.C. section 360bbb-3(b)(1), unless the authorization is terminated or revoked.  Performed at [facility], [address] [place], [place] [address]      Coagulation Studies: No results for input(s): LABPROT, INR in the last 72 hours.  Urinalysis: No results for input(s): COLORURINE, LABSPEC, PHURINE, GLUCOSEU, HGBUR, BILIRUBINUR, KETONESUR, PROTEINUR, UROBILINOGEN, NITRITE, LEUKOCYTESUR in the last 72 hours.  Invalid input(s): APPERANCEUR    Imaging: No results found.   Medications:    sodium chloride Stopped (08/21/21 0557)   ampicillin (OMNIPEN) IV Stopped (08/21/21 0535)   cefTRIAXone (ROCEPHIN)  IV Stopped (08/20/21 2158)  apixaban  2.5 mg Oral BID   Chlorhexidine Gluconate Cloth  6 each Topical Q0600   epoetin (EPOGEN/PROCRIT) injection  4,000 Units Intravenous Q M,W,F-HD   feeding supplement (NEPRO CARB STEADY)  237 mL Oral TID BM   lidocaine  2 patch Transdermal Q24H   nicotine  21 mg Transdermal Daily   senna-docusate  1 tablet Oral BID   sevelamer carbonate  1,600 mg Oral TID WC   sodium chloride, acetaminophen, bisacodyl, hydrALAZINE, LORazepam, methocarbamol, Muscle Rub, ondansetron (ZOFRAN) IV, oxyCODONE-acetaminophen, polyethylene glycol  Assessment/ Plan:  [name] [name] is a 60 y.o. black male with end stage renal disease on hemodialysis, hypertension, congestive heart failure, substance abuse who is admitted to [facility] on 08/08/2021 for Lower back pain [M54.50] Acute midline low back pain without sciatica [M54.50] At risk for inadequate pain control [Z91.89] Back pain [M54.9] Found to have eneterococcus faecalis bacteremia and osteomyelitis of C5-C6.   [facility] MWF [facility] Right AVG Hero [phone]  End Stage Renal Disease: seen and examined on hemodialysis treatment.  - Continue MWF schedule.   Hypertension: 132/67. Holding home dose of amlodipine.   Anemia of chronic kidney disease: hemoglobin 8.3 - EPO with HD treatments  Secondary Hyperparathyroidism: with hypercalcemia and hyperphosphatemia.   - Sevelamer with meals.   Osteomyelitis: C5=C6  and L4-L5  - ampicillin and ceftriaxone for a  total of 6 weeks. End Date is 09/23/2021.   Dispo: patient was scheduled for discharge today but was unable to sit in a chair for dialysis treatment. He will need to sit in a chair for 4 hours to be able to tolerate outpatient in center hemodialysis.    LOS: 12 Kaliyan Osbourn 9/12/202212:28 PM

## 2021-08-22 DIAGNOSIS — M5440 Lumbago with sciatica, unspecified side: Secondary | ICD-10-CM | POA: Diagnosis not present

## 2021-08-22 DIAGNOSIS — F1910 Other psychoactive substance abuse, uncomplicated: Secondary | ICD-10-CM | POA: Diagnosis not present

## 2021-08-22 DIAGNOSIS — I82C19 Acute embolism and thrombosis of unspecified internal jugular vein: Secondary | ICD-10-CM | POA: Diagnosis not present

## 2021-08-22 DIAGNOSIS — N186 End stage renal disease: Secondary | ICD-10-CM | POA: Diagnosis not present

## 2021-08-22 NOTE — Progress Notes (Signed)
[facility]  ROUNDING NOTE   Subjective:   Patient seen resting in bed Alert, confused at times Rates pain 7/10, appears more comfortable Actively heaving during later visit, requesting antiemetic from RN   Objective:  Vital signs in last 24 hours:  Temp:  [97.5 F (36.4 C)-98.9 F (37.2 C)] 97.7 F (36.5 C) (09/13 0751) Pulse Rate:  [92-103] 103 (09/13 0751) Resp:  [12-20] 20 (09/13 0751) BP: (105-135)/(52-97) 133/69 (09/13 0751) SpO2:  [91 %-100 %] 100 % (09/13 0751)  Weight change:  Filed Weights   08/08/21 0625 08/16/21 0754  Weight: 91 kg 91 kg    Intake/Output: I/O last 3 completed shifts: In: 608.3 [I.V.:8.3; IV Piggyback:600] Out: 1500 [Other:1500]   Intake/Output this shift:  No intake/output data recorded.  Physical Exam: General: NAD, resting in bed  Head: Normocephalic, atraumatic. Moist oral mucosal membranes  Eyes: Anicteric  Lungs:  Clear to auscultation, normal effort  Heart: Regular rate and rhythm  Abdomen:  Soft, nontender, distended  Extremities: No edema  Neurologic: Alert, moving all four extremities  Skin: No lesions  Access: Rt AVG    Basic Metabolic Panel: Recent Labs  Lab 08/16/21 0429 08/17/21 0449 08/18/21 0416 08/19/21 0433 08/21/21 0630  NA 136 135 136 138 141  K 4.2 3.9 4.7 4.8 5.0  CL 96* 96* 95* 96* 99  CO2 28 30 27 28 29   GLUCOSE 96 95 86 85 103*  BUN 58* 38* 50* 65* 63*  CREATININE 8.68* 7.01* 8.54* 9.93* 9.88*  CALCIUM 10.9* 10.2 11.1* 10.8* 10.6*  MG 1.9 1.9 2.1 2.3  --   PHOS 6.2*  --   --   --  7.7*     Liver Function Tests: No results for input(s): AST, ALT, ALKPHOS, BILITOT, PROT, ALBUMIN in the last 168 hours.  No results for input(s): LIPASE, AMYLASE in the last 168 hours. No results for input(s): AMMONIA in the last 168 hours.  CBC: Recent Labs  Lab 08/16/21 0429 08/17/21 0449 08/18/21 0416 08/19/21 0433 08/21/21 0630  WBC 8.6 7.7 9.0 8.6 10.4  HGB 8.8* 7.8* 8.0* 8.7* 8.3*   HCT 28.1* 24.5* 26.2* 27.3* 28.2*  MCV 95.6 96.1 94.6 98.6 97.9  PLT 211 195 215 233 192     Cardiac Enzymes: No results for input(s): CKTOTAL, CKMB, CKMBINDEX, TROPONINI in the last 168 hours.  BNP: Invalid input(s): POCBNP  CBG: No results for input(s): GLUCAP in the last 168 hours.   Microbiology: Results for orders placed or performed during the hospital encounter of 08/08/21  Resp Panel by RT-PCR (Flu A&B, Covid) Nasopharyngeal Swab     Status: None   Collection Time: 08/08/21  1:22 PM   Specimen: Nasopharyngeal Swab; Nasopharyngeal(NP) swabs in vial transport medium  Result Value Ref Range Status   SARS Coronavirus 2 by RT PCR NEGATIVE NEGATIVE Final    Comment: (NOTE) SARS-CoV-2 target nucleic acids are NOT DETECTED.  The SARS-CoV-2 RNA is generally detectable in upper respiratory specimens during the acute phase of infection. The lowest concentration of SARS-CoV-2 viral copies this assay can detect is 138 copies/mL. A negative result does not preclude SARS-Cov-2 infection and should not be used as the sole basis for treatment or other patient management decisions. A negative result may occur with  improper specimen collection/handling, submission of specimen other than nasopharyngeal swab, presence of viral mutation(s) within the areas targeted by this assay, and inadequate number of viral copies(<138 copies/mL). A negative result must be combined with clinical observations, patient  history, and epidemiological information. The expected result is Negative.  Fact Sheet for Patients:  EntrepreneurPulse.com.au  Fact Sheet for Healthcare Providers:  IncredibleEmployment.be  This test is no t yet approved or cleared by the [place] FDA and  has been authorized for detection and/or diagnosis of SARS-CoV-2 by FDA under an Emergency Use Authorization (EUA). This EUA will remain  in effect (meaning this test can be used) for the  duration of the COVID-19 declaration under Section 564(b)(1) of the Act, 21 U.S.C.section 360bbb-3(b)(1), unless the authorization is terminated  or revoked sooner.       Influenza A by PCR NEGATIVE NEGATIVE Final   Influenza B by PCR NEGATIVE NEGATIVE Final    Comment: (NOTE) The Xpert Xpress SARS-CoV-2/FLU/RSV plus assay is intended as an aid in the diagnosis of influenza from Nasopharyngeal swab specimens and should not be used as a sole basis for treatment. Nasal washings and aspirates are unacceptable for Xpert Xpress SARS-CoV-2/FLU/RSV testing.  Fact Sheet for Patients: EntrepreneurPulse.com.au  Fact Sheet for Healthcare Providers: IncredibleEmployment.be  This test is not yet approved or cleared by the [place] FDA and has been authorized for detection and/or diagnosis of SARS-CoV-2 by FDA under an Emergency Use Authorization (EUA). This EUA will remain in effect (meaning this test can be used) for the duration of the COVID-19 declaration under Section 564(b)(1) of the Act, 21 U.S.C. section 360bbb-3(b)(1), unless the authorization is terminated or revoked.  Performed at [facility], [address] [place], [place] [address]   Culture, blood (Routine X 2) w Reflex to ID Panel     Status: Abnormal   Collection Time: 08/11/21  5:47 PM   Specimen: BLOOD  Result Value Ref Range Status   Specimen Description   Final    BLOOD LEFT HAND Performed at [facility], [address] [place], [place] [address]    Special Requests   Final    BOTTLES DRAWN AEROBIC AND ANAEROBIC Blood Culture adequate volume Performed at [facility], [address] [place], [place] [address]    Culture  Setup Time   Final    GRAM POSITIVE COCCI IN CHAINS IN BOTH AEROBIC AND ANAEROBIC BOTTLES Gram Stain Report Called to,Read Back By and Verified With: JASON ROBBINS 08/12/21 @ 0507 BY SB Performed at [facility],  [address] [place], [place] [address]    Culture (A)  Final    ENTEROCOCCUS FAECALIS SUSCEPTIBILITIES PERFORMED ON PREVIOUS CULTURE WITHIN THE LAST 5 DAYS. Performed at [facility], [place] [address], [place], [place] [address]    Report Status 08/14/2021 FINAL  Final  Culture, blood (Routine X 2) w Reflex to ID Panel     Status: Abnormal   Collection Time: 08/11/21  5:47 PM   Specimen: BLOOD  Result Value Ref Range Status   Specimen Description   Final    BLOOD FOA Performed at [facility], [address] [place], [place] [address]    Special Requests   Final    BOTTLES DRAWN AEROBIC AND ANAEROBIC Blood Culture adequate volume Performed at [facility], [address] [place], [place] [address]    Culture  Setup Time   Final    GRAM POSITIVE COCCI IN CHAINS IN BOTH AEROBIC AND ANAEROBIC BOTTLES Gram Stain Report Called to,Read Back By and Verified With: JASON ROBBINS 08/12/21 @ 0507 BY SB Performed at [facility], [place] [address], [place],  [address]    Culture  ENTEROCOCCUS FAECALIS (A)  Final   Report Status 08/14/2021 FINAL  Final   Organism ID, Bacteria ENTEROCOCCUS FAECALIS  Final      Susceptibility   Enterococcus faecalis - MIC*    AMPICILLIN <=2 SENSITIVE Sensitive     VANCOMYCIN 2 SENSITIVE Sensitive     GENTAMICIN SYNERGY SENSITIVE Sensitive     * ENTEROCOCCUS FAECALIS  Blood Culture ID Panel (Reflexed)     Status: Abnormal   Collection Time: 08/11/21  5:47 PM  Result Value Ref Range Status   Enterococcus faecalis DETECTED (A) NOT DETECTED Final    Comment: CRITICAL RESULT CALLED TO, READ BACK BY AND VERIFIED WITH: JASON ROBBINS 08/12/21 @ 0507 BY SB    Enterococcus Faecium NOT DETECTED NOT DETECTED Final   Listeria monocytogenes NOT DETECTED NOT DETECTED Final   Staphylococcus species NOT DETECTED NOT DETECTED Final   Staphylococcus aureus (BCID) NOT DETECTED NOT DETECTED Final   Staphylococcus epidermidis NOT DETECTED  NOT DETECTED Final   Staphylococcus lugdunensis NOT DETECTED NOT DETECTED Final   Streptococcus species NOT DETECTED NOT DETECTED Final   Streptococcus agalactiae NOT DETECTED NOT DETECTED Final   Streptococcus pneumoniae NOT DETECTED NOT DETECTED Final   Streptococcus pyogenes NOT DETECTED NOT DETECTED Final   A.calcoaceticus-baumannii NOT DETECTED NOT DETECTED Final   Bacteroides fragilis NOT DETECTED NOT DETECTED Final   Enterobacterales NOT DETECTED NOT DETECTED Final   Enterobacter cloacae complex NOT DETECTED NOT DETECTED Final   Escherichia coli NOT DETECTED NOT DETECTED Final   Klebsiella aerogenes NOT DETECTED NOT DETECTED Final   Klebsiella oxytoca NOT DETECTED NOT DETECTED Final   Klebsiella pneumoniae NOT DETECTED NOT DETECTED Final   Proteus species NOT DETECTED NOT DETECTED Final   Salmonella species NOT DETECTED NOT DETECTED Final   Serratia marcescens NOT DETECTED NOT DETECTED Final   Haemophilus influenzae NOT DETECTED NOT DETECTED Final   Neisseria meningitidis NOT DETECTED NOT DETECTED Final   Pseudomonas aeruginosa NOT DETECTED NOT DETECTED Final   Stenotrophomonas maltophilia NOT DETECTED NOT DETECTED Final   Candida albicans NOT DETECTED NOT DETECTED Final   Candida auris NOT DETECTED NOT DETECTED Final   Candida glabrata NOT DETECTED NOT DETECTED Final   Candida krusei NOT DETECTED NOT DETECTED Final   Candida parapsilosis NOT DETECTED NOT DETECTED Final   Candida tropicalis NOT DETECTED NOT DETECTED Final   Cryptococcus neoformans/gattii NOT DETECTED NOT DETECTED Final   Vancomycin resistance NOT DETECTED NOT DETECTED Final    Comment: Performed at [facility], [address] [place], [place] [address]  CULTURE, BLOOD (ROUTINE X 2) w Reflex to ID Panel     Status: None   Collection Time: 08/12/21 11:58 PM   Specimen: BLOOD  Result Value Ref Range Status   Specimen Description BLOOD BLOOD LEFT HAND  Final   Special Requests   Final    BOTTLES  DRAWN AEROBIC AND ANAEROBIC Blood Culture adequate volume   Culture   Final    NO GROWTH 5 DAYS Performed at [facility], [address] [place], [place] [address]    Report Status 08/18/2021 FINAL  Final  CULTURE, BLOOD (ROUTINE X 2) w Reflex to ID Panel     Status: None   Collection Time: 08/12/21 11:58 PM   Specimen: BLOOD  Result Value Ref Range Status   Specimen Description BLOOD LEFT RADIAL  Final   Special Requests   Final    BOTTLES DRAWN AEROBIC AND ANAEROBIC Blood Culture results may not be optimal due to  an inadequate volume of blood received in culture bottles   Culture   Final    NO GROWTH 5 DAYS Performed at [facility], [address] [place], [place] [address]    Report Status 08/18/2021 FINAL  Final  Resp Panel by RT-PCR (Flu A&B, Covid) Nasopharyngeal Swab     Status: None   Collection Time: 08/21/21  9:50 AM   Specimen: Nasopharyngeal Swab; Nasopharyngeal(NP) swabs in vial transport medium  Result Value Ref Range Status   SARS Coronavirus 2 by RT PCR NEGATIVE NEGATIVE Final    Comment: (NOTE) SARS-CoV-2 target nucleic acids are NOT DETECTED.  The SARS-CoV-2 RNA is generally detectable in upper respiratory specimens during the acute phase of infection. The lowest concentration of SARS-CoV-2 viral copies this assay can detect is 138 copies/mL. A negative result does not preclude SARS-Cov-2 infection and should not be used as the sole basis for treatment or other patient management decisions. A negative result may occur with  improper specimen collection/handling, submission of specimen other than nasopharyngeal swab, presence of viral mutation(s) within the areas targeted by this assay, and inadequate number of viral copies(<138 copies/mL). A negative result must be combined with clinical observations, patient history, and epidemiological information. The expected result is Negative.  Fact Sheet for Patients:   EntrepreneurPulse.com.au  Fact Sheet for Healthcare Providers:  IncredibleEmployment.be  This test is no t yet approved or cleared by the [place] FDA and  has been authorized for detection and/or diagnosis of SARS-CoV-2 by FDA under an Emergency Use Authorization (EUA). This EUA will remain  in effect (meaning this test can be used) for the duration of the COVID-19 declaration under Section 564(b)(1) of the Act, 21 U.S.C.section 360bbb-3(b)(1), unless the authorization is terminated  or revoked sooner.       Influenza A by PCR NEGATIVE NEGATIVE Final   Influenza B by PCR NEGATIVE NEGATIVE Final    Comment: (NOTE) The Xpert Xpress SARS-CoV-2/FLU/RSV plus assay is intended as an aid in the diagnosis of influenza from Nasopharyngeal swab specimens and should not be used as a sole basis for treatment. Nasal washings and aspirates are unacceptable for Xpert Xpress SARS-CoV-2/FLU/RSV testing.  Fact Sheet for Patients: EntrepreneurPulse.com.au  Fact Sheet for Healthcare Providers: IncredibleEmployment.be  This test is not yet approved or cleared by the [place] FDA and has been authorized for detection and/or diagnosis of SARS-CoV-2 by FDA under an Emergency Use Authorization (EUA). This EUA will remain in effect (meaning this test can be used) for the duration of the COVID-19 declaration under Section 564(b)(1) of the Act, 21 U.S.C. section 360bbb-3(b)(1), unless the authorization is terminated or revoked.  Performed at [facility], [address] [place], [place] [address]     Coagulation Studies: No results for input(s): LABPROT, INR in the last 72 hours.  Urinalysis: No results for input(s): COLORURINE, LABSPEC, PHURINE, GLUCOSEU, HGBUR, BILIRUBINUR, KETONESUR, PROTEINUR, UROBILINOGEN, NITRITE, LEUKOCYTESUR in the last 72 hours.  Invalid input(s): APPERANCEUR    Imaging: No  results found.   Medications:    sodium chloride Stopped (08/21/21 0557)   ampicillin (OMNIPEN) IV 2 g (08/22/21 8469)   cefTRIAXone (ROCEPHIN)  IV 2 g (08/22/21 0846)     apixaban  2.5 mg Oral BID   Chlorhexidine Gluconate Cloth  6 each Topical Q0600   epoetin (EPOGEN/PROCRIT) injection  4,000 Units Intravenous Q M,W,F-HD   feeding supplement (NEPRO CARB STEADY)  237 mL Oral TID BM   lidocaine  2 patch Transdermal Q24H  nicotine  21 mg Transdermal Daily   senna-docusate  1 tablet Oral BID   sevelamer carbonate  1,600 mg Oral TID WC   sodium chloride, acetaminophen, bisacodyl, hydrALAZINE, LORazepam, methocarbamol, Muscle Rub, ondansetron (ZOFRAN) IV, oxyCODONE-acetaminophen, polyethylene glycol, sodium chloride flush  Assessment/ Plan:  [name] [name] is a 60 y.o. black male with end stage renal disease on hemodialysis, hypertension, congestive heart failure, substance abuse who is admitted to [facility] for Lower back pain [M54.50] Acute midline low back pain without sciatica [M54.50] At risk for inadequate pain control [Z91.89] Back pain [M54.9] Found to have eneterococcus faecalis bacteremia and osteomyelitis of C5-C6.   [facility] MWF [facility] Right AVG Hero 905kg  End Stage Renal Disease:  Continue MWF schedule. Next treatment scheduled for Wednesday, preferable in a chair  Hypertension: stable 133/69. Holding home dose of amlodipine.   Anemia of chronic kidney disease: hemoglobin 8.3 - Low dose EPO with HD treatments  Secondary Hyperparathyroidism: with hypercalcemia and hyperphosphatemia.   - Sevelamer with meals.   Osteomyelitis: C5=C6  and L4-L5  - ampicillin and ceftriaxone for a total of 6 weeks. End Date is 09/23/2021.   Dispo:Patient needs to be able to sit in chair for 4 hours before discharge. This does not have to be during dialysis, but will provide a good indication that patient will be able tolerate this once discharged.    LOS:  [place] 9/13/202211:04 AM

## 2021-08-22 NOTE — Progress Notes (Signed)
Patient ID: [name] [name], male   DOB: 22-Sep-1961, 60 y.o.   MRN: [id number]  PROGRESS NOTE    [name] [name]  GXQ:[id number] DOB: August 09, 1961 DOA: 08/08/2021 PCP: Pcp, No   Brief Narrative:  60 y.o. male with medical history significant of ESRD-HD, hypertension, tobacco abuse, polysubstance abuse, diverticulitis, anemia, jugular vein thrombosis, GI bleeding, right pneumothorax presented with worsening lower back pain.  He was found to have sepsis secondary to Enterococcus faecalis bacteremia.  ID was consulted and he has been on ampicillin and ceftriaxone.  He was found to have possible developing osteomyelitis in C5-C6 with possible early left psoas abscess formation and possible early discitis in L4-L5.  Nephrology was also consulted for continuation of dialysis.  TEE showed aortic valve endocarditis.  PT recommended SNF placement.  He is currently medically stable for discharge.  He was supposed to be discharged to SNF on 08/21/2021 but discharge was held because he could not sit up for dialysis.  Assessment & Plan:   Sepsis: Possibly evolving on admission Enterococcus faecalis bacteremia Aortic valve endocarditis -ID following.  Currently on Rocephin and ampicillin. -TTE negative for vegetations. 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  ID is recommending 6 to weeks of ampicillin and Rocephin till 09/23/2021 followed by p.o. amoxicillin for a few weeks.  -Status post tunneled catheter placement on 08/18/2021 by IR -Blood cultures from 08/11/2021 grew E faecalis.  Blood cultures from 08/12/2021 have been negative so far. -Sepsis has resolved.  Currently hemodynamically stable with no temperature spikes.  Severe lower back pain Possible developing osteomyelitis in C5-C6 Left psoas muscle infection with superimposed 1.3 cm phlegmon/early abscess formation Possible early discitis in L4-L5 -Continue antibiotics as above.  Continue pain management and bowel regimen  End-stage renal disease on  hemodialysis -Nephrology following.  Dialysis as per nephrology schedule -Patient has been unable to sit up for dialysis  Anemia of chronic disease -From renal failure.  Hemoglobin stable.  Monitor intermittently.  Polysubstance abuse and tobacco abuse -Patient was counseled regarding cessation by prior hospitalist.  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 Keturah Barre recommendations.  Continue Eliquis  Hypertension Hypotension -Blood pressure currently stable.  Off amlodipine  Chronic combined CHF -LVEF of 30 to 35% as per TTE on 08/13/2021.  Volume managed by dialysis.  Strict input output.  Daily weights.  Fluid restriction  Intermittent delirium -Monitor mental status.   -Currently mental status stable.  Bedside sitter has been discontinued.  Fall precautions.  Generalized conditioning -PT/OT recommend SNF placement.  Social worker following.  DVT prophylaxis: Eliquis Code Status: Full Family Communication: None at bedside Disposition Plan: Status is: Inpatient  Remains inpatient appropriate because:Inpatient level of care appropriate due to severity of illness  Dispo: The patient is from: Home              Anticipated d/c is to: SNF              Patient currently is medically stable to d/c.    Difficult to place patient No  Consultants: ID/cardiology/nephrology  Procedures: TTE/TEE Tunneled catheter placement by IR on 08/18/2021  Antimicrobials:  Anti-infectives (From admission, onward)    Start     Dose/Rate Route Frequency Ordered Stop   08/21/21 0000  ampicillin IVPB        2 g Intravenous Every 12 hours 08/21/21 0940 09/27/21 2359   08/21/21 0000  cefTRIAXone (ROCEPHIN) IVPB        2 g Intravenous Every 12 hours 08/21/21 0940 09/27/21 2359   08/14/21 1530  cefTRIAXone (ROCEPHIN) 2 g in sodium chloride 0.9 % 100 mL IVPB        2 g 200 mL/hr over 30  Minutes Intravenous Every 12 hours 08/14/21 1443     08/14/21 1200  vancomycin (VANCOCIN) IVPB 1000 mg/200 mL premix  Status:  Discontinued        1,000 mg 200 mL/hr over 60 Minutes Intravenous Every M-W-F (Hemodialysis) 08/11/21 1831 08/12/21 0517   08/12/21 2200  cefTRIAXone (ROCEPHIN) 2 g in sodium chloride 0.9 % 100 mL IVPB  Status:  Discontinued        2 g 200 mL/hr over 30 Minutes Intravenous Every 12 hours 08/12/21 1923 08/14/21 1443   08/12/21 0615  ampicillin (OMNIPEN) 2 g in sodium chloride 0.9 % 100 mL IVPB        2 g 300 mL/hr over 20 Minutes Intravenous Every 12 hours 08/12/21 0520     08/11/21 1930  vancomycin (VANCOREADY) IVPB 2000 mg/400 mL        2,000 mg 200 mL/hr over 120 Minutes Intravenous  Once 08/11/21 1831 08/12/21 0156   08/11/21 1900  cefTRIAXone (ROCEPHIN) 2 g in sodium chloride 0.9 % 100 mL IVPB  Status:  Discontinued        2 g 200 mL/hr over 30 Minutes Intravenous Every 12 hours 08/11/21 1829 08/12/21 0517        Subjective: Patient seen and examined at bedside.  Poor historian.  No fever, vomiting, worsening shortness of breath reported.   Objective: Vitals:   08/21/21 1609 08/21/21 2024 08/22/21 0412 08/22/21 0751  BP: (!) 135/56 132/62 (!) 120/97 133/69  Pulse: 99 92 95 (!) 103  Resp: 14 17 17 20   Temp: (!) 97.5 F (36.4 C) 98.6 F (37 C) 98.9 F (37.2 C) 97.7 F (36.5 C)  TempSrc:  Oral Axillary   SpO2: 91% 100% 100% 100%  Weight:      Height:        Intake/Output Summary (Last 24 hours) at 08/22/2021 0801 Last data filed at 08/22/2021 1027 Gross per 24 hour  Intake 200 ml  Output 1500 ml  Net -1300 ml    Filed Weights   08/08/21 0625 08/16/21 0754  Weight: 91 kg 91 kg    Examination:  General exam: Chronically ill looking and deconditioned.  No distress.  On room air currently.   Respiratory system: Bilateral decreased breath sounds at bases cardiovascular system: S1-S2 heard, intermittently tachycardic gastrointestinal system:  Abdomen is distended slightly, soft and nontender.  Bowel sounds are heard  extremities: Lower extremity edema present bilaterally; no clubbing Central nervous system: Wakes up slightly, still slow to respond.  Poor historian.  No focal neurological deficits.  Moves extremities skin: No obvious ecchymosis/lesions psychiatry: Hardly participates in any conversation.  Extremely flat affect.  Data Reviewed: I have personally reviewed following labs and imaging studies  CBC: Recent Labs  Lab 08/16/21 0429 08/17/21 0449 08/18/21 0416 08/19/21 0433 08/21/21 0630  WBC 8.6 7.7 9.0 8.6 10.4  HGB 8.8* 7.8* 8.0* 8.7* 8.3*  HCT 28.1* 24.5* 26.2* 27.3* 28.2*  MCV 95.6 96.1 94.6 98.6 97.9  PLT 211 195 215 233 253    Basic Metabolic Panel: Recent Labs  Lab 08/16/21 0429 08/17/21 0449 08/18/21 0416 08/19/21 0433 08/21/21 0630  NA 136 135 136 138 141  K 4.2 3.9 4.7 4.8  5.0  CL 96* 96* 95* 96* 99  CO2 28 30 27 28 29   GLUCOSE 96 95 86 85 103*  BUN 58* 38* 50* 65* 63*  CREATININE 8.68* 7.01* 8.54* 9.93* 9.88*  CALCIUM 10.9* 10.2 11.1* 10.8* 10.6*  MG 1.9 1.9 2.1 2.3  --   PHOS 6.2*  --   --   --  7.7*    GFR: Estimated Creatinine Clearance: 9.3 mL/min (A) (by C-G formula based on SCr of 9.88 mg/dL (H)). Liver Function Tests: No results for input(s): AST, ALT, ALKPHOS, BILITOT, PROT, ALBUMIN in the last 168 hours. No results for input(s): LIPASE, AMYLASE in the last 168 hours. No results for input(s): AMMONIA in the last 168 hours. Coagulation Profile: No results for input(s): INR, PROTIME in the last 168 hours. Cardiac Enzymes: No results for input(s): CKTOTAL, CKMB, CKMBINDEX, TROPONINI in the last 168 hours. BNP (last 3 results) No results for input(s): PROBNP in the last 8760 hours. HbA1C: No results for input(s): HGBA1C in the last 72 hours. CBG: No results for input(s): GLUCAP in the last 168 hours.  Lipid Profile: No results for input(s): CHOL, HDL, LDLCALC, TRIG,  CHOLHDL, LDLDIRECT in the last 72 hours. Thyroid Function Tests: No results for input(s): TSH, T4TOTAL, FREET4, T3FREE, THYROIDAB in the last 72 hours. Anemia Panel: No results for input(s): VITAMINB12, FOLATE, FERRITIN, TIBC, IRON, RETICCTPCT in the last 72 hours. Sepsis Labs: No results for input(s): PROCALCITON, LATICACIDVEN in the last 168 hours.   Recent Results (from the past 240 hour(s))  CULTURE, BLOOD (ROUTINE X 2) w Reflex to ID Panel     Status: None   Collection Time: 08/12/21 11:58 PM   Specimen: BLOOD  Result Value Ref Range Status   Specimen Description BLOOD BLOOD LEFT HAND  Final   Special Requests   Final    BOTTLES DRAWN AEROBIC AND ANAEROBIC Blood Culture adequate volume   Culture   Final    NO GROWTH 5 DAYS Performed at [facility], [address] [place], [place] [address]    Report Status 08/18/2021 FINAL  Final  CULTURE, BLOOD (ROUTINE X 2) w Reflex to ID Panel     Status: None   Collection Time: 08/12/21 11:58 PM   Specimen: BLOOD  Result Value Ref Range Status   Specimen Description BLOOD LEFT RADIAL  Final   Special Requests   Final    BOTTLES DRAWN AEROBIC AND ANAEROBIC Blood Culture results may not be optimal due to an inadequate volume of blood received in culture bottles   Culture   Final    NO GROWTH 5 DAYS Performed at [facility], [address] [place],  [address]    Report Status 08/18/2021 FINAL  Final  Resp Panel by RT-PCR (Flu A&B, Covid) Nasopharyngeal Swab     Status: None   Collection Time: 08/21/21  9:50 AM   Specimen: Nasopharyngeal Swab; Nasopharyngeal(NP) swabs in vial transport medium  Result Value Ref Range Status   SARS Coronavirus 2 by RT PCR NEGATIVE NEGATIVE Final    Comment: (NOTE) SARS-CoV-2 target nucleic acids are NOT DETECTED.  The SARS-CoV-2 RNA is generally detectable in upper respiratory specimens during the acute phase of infection. The lowest concentration of SARS-CoV-2 viral  copies this assay can detect is 138 copies/mL. A negative result does not preclude SARS-Cov-2 infection and should not be used as the sole basis for treatment or other patient management decisions. A negative result may occur with  improper specimen collection/handling,  submission of specimen other than nasopharyngeal swab, presence of viral mutation(s) within the areas targeted by this assay, and inadequate number of viral copies(<138 copies/mL). A negative result must be combined with clinical observations, patient history, and epidemiological information. The expected result is Negative.  Fact Sheet for Patients:  EntrepreneurPulse.com.au  Fact Sheet for Healthcare Providers:  IncredibleEmployment.be  This test is no t yet approved or cleared by the [place] FDA and  has been authorized for detection and/or diagnosis of SARS-CoV-2 by FDA under an Emergency Use Authorization (EUA). This EUA will remain  in effect (meaning this test can be used) for the duration of the COVID-19 declaration under Section 564(b)(1) of the Act, 21 U.S.C.section 360bbb-3(b)(1), unless the authorization is terminated  or revoked sooner.       Influenza A by PCR NEGATIVE NEGATIVE Final   Influenza B by PCR NEGATIVE NEGATIVE Final    Comment: (NOTE) The Xpert Xpress SARS-CoV-2/FLU/RSV plus assay is intended as an aid in the diagnosis of influenza from Nasopharyngeal swab specimens and should not be used as a sole basis for treatment. Nasal washings and aspirates are unacceptable for Xpert Xpress SARS-CoV-2/FLU/RSV testing.  Fact Sheet for Patients: EntrepreneurPulse.com.au  Fact Sheet for Healthcare Providers: IncredibleEmployment.be  This test is not yet approved or cleared by the [place] FDA and has been authorized for detection and/or diagnosis of SARS-CoV-2 by FDA under an Emergency Use Authorization (EUA). This  EUA will remain in effect (meaning this test can be used) for the duration of the COVID-19 declaration under Section 564(b)(1) of the Act, 21 U.S.C. section 360bbb-3(b)(1), unless the authorization is terminated or revoked.  Performed at [facility], [address] [place], [place] [address]           Radiology Studies: No results found.      Scheduled Meds:  apixaban  2.5 mg Oral BID   Chlorhexidine Gluconate Cloth  6 each Topical Q0600   epoetin (EPOGEN/PROCRIT) injection  4,000 Units Intravenous Q M,W,F-HD   feeding supplement (NEPRO CARB STEADY)  237 mL Oral TID BM   lidocaine  2 patch Transdermal Q24H   nicotine  21 mg Transdermal Daily   senna-docusate  1 tablet Oral BID   sevelamer carbonate  1,600 mg Oral TID WC   Continuous Infusions:  sodium chloride Stopped (08/21/21 0557)   ampicillin (OMNIPEN) IV 2 g (08/22/21 1027)   cefTRIAXone (ROCEPHIN)  IV Stopped (08/21/21 2244)          Aline August, MD [facility] 08/22/2021, 8:01 AM

## 2021-08-22 NOTE — Evaluation (Signed)
Physical Therapy Re-Evaluation Patient Details Name: [name] [name] MRN: [id number] DOB: 30-Jun-1961 Today's Date: 08/22/2021  History of Present Illness  Pt is a 60 y.o. male with medical history significant of ESRD-HD (MWF), hypertension, tobacco abuse, polysubstance abuse, diverticulitis, anemia, jugular vein thrombosis, GI bleeding, right pneumothorax, who presents with lower back pain. 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  Clinical Impression  PT re-evaluation performed d/t pt's extended hospitalization.  Estill Bamberg (dialysis coordinator) present during session assisting with encouraging pt to mobilize and able to observe pt's mobility during session.  Initially upon PT arrival pt reporting having too much pain to participate in session (low back and L thigh); pt had received pain medication earlier but was not able to get muscle relaxer prior to planned therapy session d/t pt had become nauseas (pt received nausea medication instead); nurse came to talk with pt and then pt reporting not being nauseas anymore and agreed to taking muscle relaxer.  Therapist left to allow time for medication to work (pt did not want to get out of bed d/t pain).  Dialysis coordinator then notified therapist that pt said he would get out of bed if he had 2 strong men to help him so therapist had male therapist and male rehab aide come assist during session and pt able to stand from elevated bed and walk a few feet bed to recliner with RW with min assist x2.  Pt sitting in recliner with OT present end of PT session (attempted to position pt with pillows in chair per pt request to improve comfort but pt still appearing uncomfortable in recliner).  (Of  note, pt required min assist x2 to stand from recliner and mod assist x2 for stand pivot recliner to bed with RW during OT session d/t pt wanting back to bed--B knees noted to be flexing taking steps back to bed; see OT note for details).  Also, pt did not have his oxygen on when therapist entered pt's room (with pt resting in bed) and pt's O2 noted to be 70% on room air--therapist placed nasal cannula (2L) back on and O2 improved to 92% or greater rest of therapy session.  Will continue to focus on strengthening, balance, and progressive functional mobility during hospitalization.  Plan of care reviewed and updated.     Recommendations for follow up therapy are one component of a multi-disciplinary discharge planning process, led by the attending physician.  Recommendations may be updated based on patient status, additional functional criteria and insurance authorization.  Follow Up Recommendations SNF    Equipment Recommendations  Rolling walker with 5" wheels;3in1 (PT)    Recommendations for Other Services OT consult     Precautions / Restrictions Precautions Precautions: Fall Precaution Comments: R UE AV fistula Restrictions Weight Bearing Restrictions: No      Mobility  Bed Mobility Overal bed mobility: Needs Assistance Bed Mobility: Supine to Sit     Supine to sit: Mod assist;Max assist;+2 for physical assistance     General bed mobility comments: assist for trunk and B LE's (increased assist required d/t pt's pain)    Transfers Overall transfer level: Needs assistance Equipment used: Rolling walker (2 wheeled) Transfers: Sit to/from Stand Sit to Stand: Min assist;+2 physical assistance;From elevated surface  General transfer comment: bed height elevated; min assist x2 to stand up to RW and control descent sitting into recliner  Ambulation/Gait Ambulation/Gait assistance: Min assist;+2 physical assistance Gait Distance (Feet): 3 Feet (bed to  recliner) Assistive device: Rolling walker (2 wheeled)   Gait velocity: decreased   General Gait Details: mild B knee flexion; increased effort to take steps  Stairs            Wheelchair Mobility    Modified Rankin (Stroke Patients Only)       Balance Overall balance assessment: Needs assistance Sitting-balance support: No upper extremity supported;Feet supported Sitting balance-Leahy Scale: Fair Sitting balance - Comments: steady static sitting   Standing balance support: Bilateral upper extremity supported;During functional activity Standing balance-Leahy Scale: Poor Standing balance comment: requiring UE support through RW for upright balance                             Pertinent Vitals/Pain Pain Assessment: Faces Faces Pain Scale: Hurts even more (6/10 at rest; 8/10 with activity) Pain Location: L thigh and low back Pain Descriptors / Indicators: Aching;Cramping;Grimacing;Guarding Pain Intervention(s): Limited activity within patient's tolerance;Monitored during session;Premedicated before session;Repositioned    Home Living Family/patient expects to be discharged to:: Private residence Living Arrangements: Alone Available Help at Discharge: Family;Available PRN/intermittently Type of Home: House Home Access: Stairs to enter Entrance Stairs-Rails: None Entrance Stairs-Number of Steps: 6 Home Layout: One level Home Equipment: None;Grab bars - tub/shower      Prior Function Level of Independence: Independent         Comments: Independent ambulating community distances; no fall history. Independent with ADLs. Nephew assists with IADLs.     Hand Dominance        Extremity/Trunk Assessment   Upper Extremity Assessment Upper Extremity Assessment: Defer to OT evaluation;Generalized weakness    Lower Extremity Assessment Lower Extremity Assessment: Generalized weakness RLE Deficits / Details: difficult to assess d/t pt's pain; at least  3/5 grossly B LE's with active movement    Cervical / Trunk Assessment Cervical / Trunk Assessment: Normal  Communication   Communication: No difficulties  Cognition Arousal/Alertness: Awake/alert Behavior During Therapy: Anxious                                   General Comments: Pt oriented to at least self      General Comments  Nursing cleared pt for participation in physical therapy.  Pt agreeable to PT session.    Exercises  Transfer training   Assessment/Plan    PT Assessment Patient needs continued PT services  PT Problem List Decreased strength;Decreased activity tolerance;Decreased balance;Decreased mobility;Decreased knowledge of use of DME;Pain;Decreased cognition;Decreased safety awareness;Decreased knowledge of precautions       PT Treatment Interventions DME instruction;Gait training;Stair training;Functional mobility training;Therapeutic activities;Therapeutic exercise;Balance training;Patient/family education    PT Goals (Current goals can be found in the Care Plan section)  Acute Rehab PT Goals Patient Stated Goal: to have less pain PT Goal Formulation: With patient Time For Goal Achievement: 09/17/2021 Potential to Achieve Goals: Fair    Frequency Min 2X/week   Barriers to discharge Inaccessible home environment;Decreased caregiver support      Co-evaluation               AM-PAC PT "6 Clicks" Mobility  Outcome Measure Help needed turning from your back to your side while in a  flat bed without using bedrails?: A Lot Help needed moving from lying on your back to sitting on the side of a flat bed without using bedrails?: Total Help needed moving to and from a bed to a chair (including a wheelchair)?: Total Help needed standing up from a chair using your arms (e.g., wheelchair or bedside chair)?: Total Help needed to walk in hospital room?: Total Help needed climbing 3-5 steps with a railing? : Total 6 Click Score: 7    End of  Session Equipment Utilized During Treatment: Gait belt Activity Tolerance: Patient limited by pain Patient left: in chair (sitting in recliner with OT present for OT session) Nurse Communication: Mobility status;Precautions;Patient requests pain meds PT Visit Diagnosis: Unsteadiness on feet (R26.81);Difficulty in walking, not elsewhere classified (R26.2);Muscle weakness (generalized) (M62.81);Pain;Other abnormalities of gait and mobility (R26.89) Pain - part of body:  (LBP and L thigh pain)    Time: 1145 ([id number] and therapist returned at [phone] PT Time Calculation (min) (ACUTE ONLY): 14 min   Charges:   PT Evaluation $PT Re-evaluation: 1 Re-eval PT Treatments $Therapeutic Activity: 8-22 mins       Leitha Bleak, PT 08/22/21, 2:39 PM

## 2021-08-22 NOTE — Plan of Care (Signed)
  Problem: Education: Goal: Knowledge of General Education information will improve Description: Including pain rating scale, medication(s)/side effects and non-pharmacologic comfort measures Outcome: Not Progressing   Problem: Clinical Measurements: Goal: Ability to maintain clinical measurements within normal limits will improve Outcome: Not Progressing Goal: Will remain free from infection Outcome: Not Progressing Goal: Diagnostic test results will improve Outcome: Not Progressing Goal: Respiratory complications will improve Outcome: Not Progressing Goal: Cardiovascular complication will be avoided Outcome: Not Progressing   Problem: Elimination: Goal: Will not experience complications related to bowel motility Outcome: Not Progressing Goal: Will not experience complications related to urinary retention Outcome: Not Progressing   Problem: Pain Managment: Goal: General experience of comfort will improve Outcome: Not Progressing

## 2021-08-22 NOTE — Progress Notes (Signed)
No adverse events during shift, continues on scheduled antibiotic therapy. Vitals stable will continue to monitor.

## 2021-08-22 NOTE — Evaluation (Signed)
Occupational Therapy Re-evaluation Patient Details Name: [name] [name] MRN: [id number] DOB: 01-15-1961 Today's Date: 08/22/2021   History of Present Illness Pt is a 60 y.o. male with medical history significant of ESRD-HD (MWF), hypertension, tobacco abuse, polysubstance abuse, diverticulitis, anemia, jugular vein thrombosis, GI bleeding, right pneumothorax, who presents with lower back pain. 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   Clinical Impression   Pt seen for OT re-evaluation on this date in setting of prolonged hospitalization. Upon arrival to room, pt seated upright in recliner following session with PT. Pt grimacing and endorsing back and L LE pain, however agreeable to OT tx. Pt educated on AD for LB dressing to minimize pain associated with back movement. Pt demonstrated good understanding of reacher use to doff socks and required only SUPERVISION/SET-UP assist to doff socks with reacher. This author attempted to educate pt on use of sock aide, however pt refusing education and attempts to use sock aide d/t significant back & LE pain. Pt requesting to return to bed, and requiring MIN A+2 for sit>stand transfer and MOD A+2 for stand pivot transfer bed>chair. Of note, pt attempted to take small shuffled steps during transfer recliner>bed, however noted to have L knee buckling. At end of session, this Pryor Curia had extensive discussion with pt regarding POC, with pt reporting that he is still interested in receiving OT and that early mornings would be an ideal time for session. Since initial evaluation, pt remains limited by pain, however is making good progress toward goals and continues to benefit from skilled OT services to  maximize return to PLOF and minimize risk of future falls, injury, caregiver burden, and readmission. Will continue to follow POC. Discharge recommendation remains appropriate.       Recommendations for follow up therapy are one component of a multi-disciplinary discharge planning process, led by the attending physician.  Recommendations may be updated based on patient status, additional functional criteria and insurance authorization.   Follow Up Recommendations  SNF    Equipment Recommendations  Other (comment) (defer to next venue of care)    Recommendations for Other Services       Precautions / Restrictions Precautions Precautions: Fall Precaution Comments: R UE AV fistula Restrictions Weight Bearing Restrictions: No      Mobility Bed Mobility Overal bed mobility: Needs Assistance Bed Mobility: Sit to Supine     Sit to supine: Mod assist   General bed mobility comments: Assist for b/l LE d/t pt's pain    Transfers Overall transfer level: Needs assistance Equipment used: Rolling walker (2 wheeled) Transfers: Sit to/from [facility] Sit to Stand: Min assist;+2 physical assistance Stand pivot transfers: Mod assist;+2 physical assistance (stand pivot bed>chair)       Balance Overall balance assessment: Needs assistance Sitting-balance support: No upper extremity supported;Feet supported Sitting balance-Leahy Scale: Fair Sitting balance - Comments: steady static sitting reaching within BOS   Standing balance support: Bilateral upper extremity supported;During functional activity Standing balance-Leahy Scale: Poor Standing balance comment: requiring physical assist and UE support through RW for upright balance                           ADL either performed or assessed with clinical judgement   ADL  Overall ADL's : Needs assistance/impaired                     Lower Body Dressing: Moderate assistance;Sitting/lateral leans Lower  Body Dressing Details (indicate cue type and reason): Pt able to doff socks with reacher & set-up assist, however required MAX A to don socks in setting of pain             Functional mobility during ADLs: Moderate assistance;+2 for physical assistance;Rolling walker (stand pivot transfer with RW)        Pertinent Vitals/Pain Pain Assessment: Faces Faces Pain Scale: Hurts even more (6/10 at rest; 8/10 with activity) Pain Location: L thigh and low back Pain Descriptors / Indicators: Aching;Cramping;Grimacing;Guarding Pain Intervention(s): Limited activity within patient's tolerance;Monitored during session;Premedicated before session;Repositioned        Extremity/Trunk Assessment Upper Extremity Assessment Upper Extremity Assessment: Generalized weakness (difficult to formally assess d/t pain, but at least 3/5 grossly in all movements)   Lower Extremity Assessment Lower Extremity Assessment: Generalized weakness RLE Deficits / Details: difficult to assess d/t pt's pain; at least 3/5 grossly B LE's with active movement   Cervical / Trunk Assessment Cervical / Trunk Assessment: Normal   Communication Communication Communication: No difficulties   Cognition Arousal/Alertness: Awake/alert Behavior During Therapy: Anxious Overall Cognitive Status: No family/caregiver present to determine baseline cognitive functioning                                 General Comments: Pt oriented to at self and place. Pt reporting that he is still interested in receiving OT, but continues to be limited by pain and requires MAX encouragement to participate in session      Exercises Other Exercises Other Exercises: Pt educated on AD for LB dressing to minimize pain associated with back movement. Pt demonstrated good understanding of reacher use to doff socks, however refused to attempt using sock aide        Home Living Family/patient expects to be discharged to:: Private  residence Living Arrangements: Alone Available Help at Discharge: Family;Available PRN/intermittently Type of Home: House Home Access: Stairs to enter [employer] of Steps: 6 Entrance Stairs-Rails: None Home Layout: One level     Bathroom Shower/Tub: Teacher, early years/pre: Standard     Home Equipment: None;Grab bars - tub/shower          Prior Functioning/Environment Level of Independence: Independent        Comments: Independent ambulating community distances; no fall history. Independent with ADLs. Nephew assists with IADLs.        OT Problem List: Decreased strength;Decreased activity tolerance;Pain      OT Treatment/Interventions: Self-care/ADL training;Therapeutic exercise;DME and/or AE instruction;Energy conservation;Cognitive remediation/compensation;Therapeutic activities;Patient/family education;Balance training    OT Goals(Current goals can be found in the care plan section) Acute Rehab OT Goals Patient Stated Goal: to have less pain OT Goal Formulation: With patient Time For Goal Achievement: Sep 15, 2021 Potential to Achieve Goals: Fair ADL Goals Pt Will Perform Lower Body Dressing: with min assist;with adaptive equipment;sitting/lateral leans  OT Frequency: Min 1X/week    AM-PAC OT "6 Clicks" Daily Activity     Outcome Measure Help from another person eating meals?: None Help from another person taking care of personal grooming?: A Little Help from another person toileting, which includes using toliet, bedpan, or urinal?: A Lot Help from another person bathing (including washing, rinsing, drying)?: A Lot Help from  another person to put on and taking off regular upper body clothing?: A Lot Help from another person to put on and taking off regular lower body clothing?: A Lot 6 Click Score: 15   End of Session Equipment Utilized During Treatment: Gait belt;Rolling walker;Oxygen Nurse Communication: Mobility status  Activity  Tolerance: Patient limited by pain Patient left: in bed;with call bell/phone within reach;with bed alarm set  OT Visit Diagnosis: Muscle weakness (generalized) (M62.81);Pain Pain - Right/Left: Left Pain - part of body: Leg (back)                Time: 3448-3015 OT Time Calculation (min): 24 min Charges:  OT General Charges $OT Visit: 1 Visit OT Evaluation $OT Re-eval: 1 Re-eval OT Treatments $Self Care/Home Management : 23-37 mins  Fredirick Maudlin, OTR/L [place]

## 2021-08-23 ENCOUNTER — Inpatient Hospital Stay: Payer: Medicare Other

## 2021-08-23 DIAGNOSIS — R7881 Bacteremia: Secondary | ICD-10-CM | POA: Diagnosis not present

## 2021-08-23 DIAGNOSIS — M5440 Lumbago with sciatica, unspecified side: Secondary | ICD-10-CM | POA: Diagnosis not present

## 2021-08-23 DIAGNOSIS — D631 Anemia in chronic kidney disease: Secondary | ICD-10-CM | POA: Diagnosis not present

## 2021-08-23 DIAGNOSIS — N186 End stage renal disease: Secondary | ICD-10-CM | POA: Diagnosis not present

## 2021-08-23 LAB — CBC WITH DIFFERENTIAL/PLATELET
Abs Immature Granulocytes: 0.16 10*3/uL — ABNORMAL HIGH (ref 0.00–0.07)
Basophils Absolute: 0 10*3/uL (ref 0.0–0.1)
Basophils Relative: 0 %
Eosinophils Absolute: 0 10*3/uL (ref 0.0–0.5)
Eosinophils Relative: 0 %
HCT: 33.3 % — ABNORMAL LOW (ref 39.0–52.0)
Hemoglobin: 10.2 g/dL — ABNORMAL LOW (ref 13.0–17.0)
Immature Granulocytes: 1 %
Lymphocytes Relative: 3 %
Lymphs Abs: 0.6 10*3/uL — ABNORMAL LOW (ref 0.7–4.0)
MCH: 30.1 pg (ref 26.0–34.0)
MCHC: 30.6 g/dL (ref 30.0–36.0)
MCV: 98.2 fL (ref 80.0–100.0)
Monocytes Absolute: 1.4 10*3/uL — ABNORMAL HIGH (ref 0.1–1.0)
Monocytes Relative: 8 %
Neutro Abs: 15 10*3/uL — ABNORMAL HIGH (ref 1.7–7.7)
Neutrophils Relative %: 88 %
Platelets: 240 10*3/uL (ref 150–400)
RBC: 3.39 MIL/uL — ABNORMAL LOW (ref 4.22–5.81)
RDW: 19.9 % — ABNORMAL HIGH (ref 11.5–15.5)
WBC: 17.2 10*3/uL — ABNORMAL HIGH (ref 4.0–10.5)
nRBC: 1 % — ABNORMAL HIGH (ref 0.0–0.2)

## 2021-08-23 LAB — BASIC METABOLIC PANEL
Anion gap: 17 — ABNORMAL HIGH (ref 5–15)
BUN: 63 mg/dL — ABNORMAL HIGH (ref 6–20)
CO2: 25 mmol/L (ref 22–32)
Calcium: 10.3 mg/dL (ref 8.9–10.3)
Chloride: 97 mmol/L — ABNORMAL LOW (ref 98–111)
Creatinine, Ser: 9.01 mg/dL — ABNORMAL HIGH (ref 0.61–1.24)
GFR, Estimated: 6 mL/min — ABNORMAL LOW (ref >60)
Glucose, Bld: 57 mg/dL — ABNORMAL LOW (ref 70–99)
Potassium: 5.8 mmol/L — ABNORMAL HIGH (ref 3.5–5.1)
Sodium: 139 mmol/L (ref 135–145)

## 2021-08-23 NOTE — Progress Notes (Signed)
Patient ID: [name] [name], male   DOB: 06/04/1961, 61 y.o.   MRN: [id number]  PROGRESS NOTE    [name] [name]  KDT:[id number] DOB: 1961/07/23 DOA: 08/08/2021 PCP: Pcp, No   Brief Narrative:  60 y.o. male with medical history significant of ESRD-HD, hypertension, tobacco abuse, polysubstance abuse, diverticulitis, anemia, jugular vein thrombosis, GI bleeding, right pneumothorax presented with worsening lower back pain.  He was found to have sepsis secondary to Enterococcus faecalis bacteremia.  ID was consulted and he has been on ampicillin and ceftriaxone.  He was found to have possible developing osteomyelitis in C5-C6 with possible early left psoas abscess formation and possible early discitis in L4-L5.  Nephrology was also consulted for continuation of dialysis.  TEE showed aortic valve endocarditis.  PT recommended SNF placement.  He is currently medically stable for discharge.  He was supposed to be discharged to SNF on 08/21/2021 but discharge was held because he could not sit up for dialysis.  Assessment & Plan:   Sepsis: Possibly evolving on admission Enterococcus faecalis bacteremia Aortic valve endocarditis -ID following.  Currently on Rocephin and ampicillin. -TTE negative for vegetations. 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  ID is recommending 6 to weeks of ampicillin and Rocephin till 09/23/2021 followed by p.o. amoxicillin for a few weeks.  -Status post tunneled catheter placement on 08/18/2021 by IR -Blood cultures from 08/11/2021 grew E faecalis.  Blood cultures from 08/12/2021 have been negative so far. -Sepsis has resolved.  Currently hemodynamically stable with no temperature spikes.  Severe lower back pain Possible developing osteomyelitis in C5-C6 Left psoas muscle infection with superimposed 1.3 cm phlegmon/early abscess formation Possible early discitis in L4-L5 -Continue antibiotics as above.  Continue pain management and bowel regimen -Since patient has increase  in WBC count, will repeat imaging of C and L spine to evaluate for any developing drainable abscess collection  End-stage renal disease on hemodialysis -Nephrology following.  Dialysis as per nephrology schedule -Patient has been unable to sit up for dialysis  Anemia of chronic disease -From renal failure.  Hemoglobin stable.  Monitor intermittently.  Polysubstance abuse and tobacco abuse -Patient was counseled regarding cessation by prior hospitalist.  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 Keturah Barre recommendations.  Continue Eliquis  Hypertension Hypotension -Blood pressure currently stable.  Off amlodipine  Chronic combined CHF -LVEF of 30 to 35% as per TTE on 08/13/2021.  Volume managed by dialysis.  Strict input output.  Daily weights.  Fluid restriction  Intermittent delirium -Monitor mental status.   -Currently mental status stable.  Bedside sitter has been discontinued.  Fall precautions.  Generalized conditioning -PT/OT recommend SNF placement.  Social worker following.  DVT prophylaxis: Eliquis Code Status: Full Family Communication: None at bedside Disposition Plan: Status is: Inpatient  Remains inpatient appropriate because:Inpatient level of care appropriate due to severity of illness  Dispo: The patient is from: Home              Anticipated d/c is to: SNF              Patient currently is medically stable to d/c.    Difficult to place patient No  Consultants: ID/cardiology/nephrology  Procedures: TTE/TEE Tunneled catheter placement by IR on 08/18/2021  Antimicrobials:  Anti-infectives (From admission, onward)    Start     Dose/Rate Route Frequency Ordered Stop   08/21/21 0000  ampicillin IVPB        2 g Intravenous Every 12 hours 08/21/21 0940 09/27/21 2359   08/21/21 0000  cefTRIAXone (ROCEPHIN) IVPB        2 g Intravenous Every 12 hours  08/21/21 0940 09/27/21 2359   08/14/21 1530  cefTRIAXone (ROCEPHIN) 2 g in sodium chloride 0.9 % 100 mL IVPB        2 g 200 mL/hr over 30 Minutes Intravenous Every 12 hours 08/14/21 1443     08/14/21 1200  vancomycin (VANCOCIN) IVPB 1000 mg/200 mL premix  Status:  Discontinued        1,000 mg 200 mL/hr over 60 Minutes Intravenous Every M-W-F (Hemodialysis) 08/11/21 1831 08/12/21 0517   08/12/21 2200  cefTRIAXone (ROCEPHIN) 2 g in sodium chloride 0.9 % 100 mL IVPB  Status:  Discontinued        2 g 200 mL/hr over 30 Minutes Intravenous Every 12 hours 08/12/21 1923 08/14/21 1443   08/12/21 0615  ampicillin (OMNIPEN) 2 g in sodium chloride 0.9 % 100 mL IVPB        2 g 300 mL/hr over 20 Minutes Intravenous Every 12 hours 08/12/21 0520     08/11/21 1930  vancomycin (VANCOREADY) IVPB 2000 mg/400 mL        2,000 mg 200 mL/hr over 120 Minutes Intravenous  Once 08/11/21 1831 08/12/21 0156   08/11/21 1900  cefTRIAXone (ROCEPHIN) 2 g in sodium chloride 0.9 % 100 mL IVPB  Status:  Discontinued        2 g 200 mL/hr over 30 Minutes Intravenous Every 12 hours 08/11/21 1829 08/12/21 0517        Subjective: Patient has limited engagement in conversation.  Describes pain in his core.  Does not elaborate further. Objective: Vitals:   08/23/21 1215 08/23/21 1230 08/23/21 2135 08/23/21 2144  BP: (!) 107/56 (!) 128/54 (!) 111/54   Pulse:  88 77   Resp:      Temp:  98 F (36.7 C) 98 F (36.7 C)   TempSrc:  Axillary Oral   SpO2:  95%  97%  Weight:      Height:        Intake/Output Summary (Last 24 hours) at 08/23/2021 2213 Last data filed at 08/23/2021 2044 Gross per 24 hour  Intake 700 ml  Output 1500 ml  Net -800 ml   Filed Weights   08/08/21 0625 08/16/21 0754  Weight: 91 kg 91 kg    Examination:  General exam: Chronically ill looking and deconditioned.  No distress.  On room air currently.   Respiratory system: Bilateral decreased breath sounds at bases cardiovascular system: S1-S2  heard, intermittently tachycardic gastrointestinal system: Abdomen is distended slightly, soft and nontender.  Bowel sounds are heard  extremities: Lower extremity edema present bilaterally; no clubbing Central nervous system: Wakes up slightly, still slow to respond.  Poor historian.  No focal neurological deficits.  Moves extremities skin: No obvious ecchymosis/lesions psychiatry: Hardly participates in any conversation.  Extremely flat affect.  Data Reviewed: I have personally reviewed following labs and imaging studies  CBC: Recent Labs  Lab 08/17/21 0449 08/18/21 0416 08/19/21 0433 08/21/21 0630 08/23/21 0606  WBC 7.7 9.0 8.6 10.4 17.2*  NEUTROABS  --   --   --   --  15.0*  HGB 7.8* 8.0* 8.7* 8.3* 10.2*  HCT 24.5* 26.2* 27.3* 28.2* 33.3*  MCV 96.1 94.6 98.6 97.9 98.2  PLT 195 215 233 192 106   Basic Metabolic Panel:  Recent Labs  Lab 08/17/21 0449 08/18/21 0416 08/19/21 0433 08/21/21 0630 08/23/21 0606  NA 135 136 138 141 139  K 3.9 4.7 4.8 5.0 5.8*  CL 96* 95* 96* 99 97*  CO2 30 27 28 29 25   GLUCOSE 95 86 85 103* 57*  BUN 38* 50* 65* 63* 63*  CREATININE 7.01* 8.54* 9.93* 9.88* 9.01*  CALCIUM 10.2 11.1* 10.8* 10.6* 10.3  MG 1.9 2.1 2.3  --   --   PHOS  --   --   --  7.7*  --    GFR: Estimated Creatinine Clearance: 10.2 mL/min (A) (by C-G formula based on SCr of 9.01 mg/dL (H)). Liver Function Tests: No results for input(s): AST, ALT, ALKPHOS, BILITOT, PROT, ALBUMIN in the last 168 hours. No results for input(s): LIPASE, AMYLASE in the last 168 hours. No results for input(s): AMMONIA in the last 168 hours. Coagulation Profile: No results for input(s): INR, PROTIME in the last 168 hours. Cardiac Enzymes: No results for input(s): CKTOTAL, CKMB, CKMBINDEX, TROPONINI in the last 168 hours. BNP (last 3 results) No results for input(s): PROBNP in the last 8760 hours. HbA1C: No results for input(s): HGBA1C in the last 72 hours. CBG: No results for input(s): GLUCAP  in the last 168 hours.  Lipid Profile: No results for input(s): CHOL, HDL, LDLCALC, TRIG, CHOLHDL, LDLDIRECT in the last 72 hours. Thyroid Function Tests: No results for input(s): TSH, T4TOTAL, FREET4, T3FREE, THYROIDAB in the last 72 hours. Anemia Panel: No results for input(s): VITAMINB12, FOLATE, FERRITIN, TIBC, IRON, RETICCTPCT in the last 72 hours. Sepsis Labs: No results for input(s): PROCALCITON, LATICACIDVEN in the last 168 hours.   Recent Results (from the past 240 hour(s))  Resp Panel by RT-PCR (Flu A&B, Covid) Nasopharyngeal Swab     Status: None   Collection Time: 08/21/21  9:50 AM   Specimen: Nasopharyngeal Swab; Nasopharyngeal(NP) swabs in vial transport medium  Result Value Ref Range Status   SARS Coronavirus 2 by RT PCR NEGATIVE NEGATIVE Final    Comment: (NOTE) SARS-CoV-2 target nucleic acids are NOT DETECTED.  The SARS-CoV-2 RNA is generally detectable in upper respiratory specimens during the acute phase of infection. The lowest concentration of SARS-CoV-2 viral copies this assay can detect is 138 copies/mL. A negative result does not preclude SARS-Cov-2 infection and should not be used as the sole basis for treatment or other patient management decisions. A negative result may occur with  improper specimen collection/handling, submission of specimen other than nasopharyngeal swab, presence of viral mutation(s) within the areas targeted by this assay, and inadequate number of viral copies(<138 copies/mL). A negative result must be combined with clinical observations, patient history, and epidemiological information. The expected result is Negative.  Fact Sheet for Patients:  EntrepreneurPulse.com.au  Fact Sheet for Healthcare Providers:  IncredibleEmployment.be  This test is no t yet approved or cleared by the [place] FDA and  has been authorized for detection and/or diagnosis of SARS-CoV-2 by FDA under an Emergency  Use Authorization (EUA). This EUA will remain  in effect (meaning this test can be used) for the duration of the COVID-19 declaration under Section 564(b)(1) of the Act, 21 U.S.C.section 360bbb-3(b)(1), unless the authorization is terminated  or revoked sooner.       Influenza A by PCR NEGATIVE NEGATIVE Final   Influenza B by PCR NEGATIVE NEGATIVE Final    Comment: (NOTE) The Xpert Xpress SARS-CoV-2/FLU/RSV plus assay is intended as an aid in the diagnosis of influenza from Nasopharyngeal swab  specimens and should not be used as a sole basis for treatment. Nasal washings and aspirates are unacceptable for Xpert Xpress SARS-CoV-2/FLU/RSV testing.  Fact Sheet for Patients: EntrepreneurPulse.com.au  Fact Sheet for Healthcare Providers: IncredibleEmployment.be  This test is not yet approved or cleared by the [place] FDA and has been authorized for detection and/or diagnosis of SARS-CoV-2 by FDA under an Emergency Use Authorization (EUA). This EUA will remain in effect (meaning this test can be used) for the duration of the COVID-19 declaration under Section 564(b)(1) of the Act, 21 U.S.C. section 360bbb-3(b)(1), unless the authorization is terminated or revoked.  Performed at [facility], [address] [place], [place] [address]           Radiology Studies: No results found.      Scheduled Meds:  apixaban  2.5 mg Oral BID   Chlorhexidine Gluconate Cloth  6 each Topical Q0600   epoetin (EPOGEN/PROCRIT) injection  4,000 Units Intravenous Q M,W,F-HD   feeding supplement (NEPRO CARB STEADY)  237 mL Oral TID BM   lidocaine  2 patch Transdermal Q24H   nicotine  21 mg Transdermal Daily   senna-docusate  1 tablet Oral BID   sevelamer carbonate  1,600 mg Oral TID WC   Continuous Infusions:  sodium chloride Stopped (08/21/21 0557)   ampicillin (OMNIPEN) IV Stopped (08/23/21 1735)   cefTRIAXone (ROCEPHIN)  IV 2 g  (08/23/21 2148)          Kathie Dike, MD [facility] 08/23/2021, 10:13 PM

## 2021-08-23 NOTE — Progress Notes (Signed)
Physical Therapy Treatment Patient Details Name: [name] [name] MRN: [id number] DOB: 1961-08-03 Today's Date: 08/23/2021   History of Present Illness Pt is a 60 y.o. male with medical history significant of ESRD-HD (MWF), hypertension, tobacco abuse, polysubstance abuse, diverticulitis, anemia, jugular vein thrombosis, GI bleeding, right pneumothorax, who presents with lower back pain. 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    PT Comments    Pt was side lying in bed upon arriving. Unaware he had BM. He is oriented to self and hospital however has poor overall cognition/ insight of deficits. Chief Strategy Officer was contacted by RN staff with transport in route to take pt to HD in chair. Pt required mod assist to exit L side of bed. Stood 2 x to RW prior to ambulating ~ 5 ft to chair. Pt is extremely weak and has poor overall safety awareness. He will greatly benefit from SNF at DC to improve strength and safety with ADLs.    Recommendations for follow up therapy are one component of a multi-disciplinary discharge planning process, led by the attending physician.  Recommendations may be updated based on patient status, additional functional criteria and insurance authorization.  Follow Up Recommendations  SNF     Equipment Recommendations  Rolling walker with 5" wheels;3in1 (PT)       Precautions / Restrictions Precautions Precautions: Fall Precaution Comments: R UE AV fistula Restrictions Weight Bearing Restrictions: No     Mobility  Bed Mobility Overal bed mobility: Needs Assistance Bed Mobility: Supine to Sit     Supine to sit: Mod assist     General bed mobility comments: pt required mod assist to log roll L to  short sit. sat EOB x 2 minutes prior to standing 2 x. first time to perform hygiene care after BM.    Transfers Overall transfer level: Needs assistance Equipment used: Rolling walker (2 wheeled) Transfers: Sit to/from Stand Sit to Stand: Min assist;+2 safety/equipment (transport stand by assist for safety. no physical assistance required)         General transfer comment: pt stood 2 x EOB. was able to take 5 steps to HD chair prior to sitting  Ambulation/Gait Ambulation/Gait assistance: Min assist;+2 safety/equipment Gait Distance (Feet): 5 Feet Assistive device: Rolling walker (2 wheeled) Gait Pattern/deviations: Step-through pattern;Decreased step length - right;Decreased step length - left;Trunk flexed Gait velocity: decreased   General Gait Details: pt was able to take ~ 5 steps to recliner. vcs throughout for safety. pt have slight knee buckling however able to correct without intervention. weakness noted with all mobility, transfers, and gait    Balance Overall balance assessment: Needs assistance Sitting-balance support: No upper extremity supported;Feet supported Sitting balance-Leahy Scale: Good Sitting balance - Comments: pt sat EOB with supervision without LOB   Standing balance support: Bilateral upper extremity supported;During functional activity Standing balance-Leahy Scale: Poor Standing balance comment: pt is high fall risk 2/2 to weakness. knees buckling at times however easily able to prevent falling without intervention      Cognition Arousal/Alertness: Awake/alert Behavior During Therapy: Flat affect Overall Cognitive Status: No family/caregiver present to determine baseline cognitive functioning   General Comments: Pt is A and oriented to hospital however poor insight of deficits and poor awareness of safety concerns  Pertinent Vitals/Pain Pain Assessment: 0-10 Pain Score: 6  Faces Pain Scale: Hurts even more Pain Location: L thigh and  low back Pain Descriptors / Indicators: Aching;Cramping;Grimacing;Guarding Pain Intervention(s): Limited activity within patient's tolerance;Monitored during session;Repositioned     PT Goals (current goals can now be found in the care plan section) Acute Rehab PT Goals Patient Stated Goal: none stated Progress towards PT goals: Progressing toward goals    Frequency    Min 2X/week      PT Plan Current plan remains appropriate       AM-PAC PT "6 Clicks" Mobility   Outcome Measure  Help needed turning from your back to your side while in a flat bed without using bedrails?: A Lot Help needed moving from lying on your back to sitting on the side of a flat bed without using bedrails?: A Lot Help needed moving to and from a bed to a chair (including a wheelchair)?: A Lot Help needed standing up from a chair using your arms (e.g., wheelchair or bedside chair)?: A Lot Help needed to walk in hospital room?: A Lot Help needed climbing 3-5 steps with a railing? : A Lot 6 Click Score: 12    End of Session   Activity Tolerance: Patient tolerated treatment well;Patient limited by fatigue Patient left: in chair (HD chair. Transport taking pt down) Nurse Communication: Mobility status;Precautions PT Visit Diagnosis: Unsteadiness on feet (R26.81);Difficulty in walking, not elsewhere classified (R26.2);Muscle weakness (generalized) (M62.81);Pain;Other abnormalities of gait and mobility (R26.89) Pain - part of body:  (back)     Time: 0812-0820 PT Time Calculation (min) (ACUTE ONLY): 8 min  Charges:  $Therapeutic Activity: 8-22 mins                     Julaine Fusi PTA 08/23/21, 8:28 AM

## 2021-08-23 NOTE — Progress Notes (Signed)
[facility]  ROUNDING NOTE   Subjective:   Patient seen and evaluated during dialysis   HEMODIALYSIS FLOWSHEET:  Blood Flow Rate (mL/min): 350 mL/min Arterial Pressure (mmHg): -250 mmHg Venous Pressure (mmHg): 170 mmHg Transmembrane Pressure (mmHg): 80 mmHg Ultrafiltration Rate (mL/min): 670 mL/min Dialysate Flow Rate (mL/min): 500 ml/min Conductivity: Machine : 13.8 Conductivity: Machine : 13.8 Dialysis Fluid Bolus: Normal Saline Bolus Amount (mL): 250 mL  Receiving dialysis in chair, tolerating well at this time   Objective:  Vital signs in last 24 hours:  Temp:  [97.7 F (36.5 C)-98.9 F (37.2 C)] 98.9 F (37.2 C) (09/14 0820) Pulse Rate:  [84-101] 84 (09/14 1000) Resp:  [14-20] 16 (09/14 0820) BP: (112-136)/(48-78) 116/48 (09/14 1000) SpO2:  [87 %-100 %] 99 % (09/14 0830)  Weight change:  Filed Weights   08/08/21 0625 08/16/21 0754  Weight: 91 kg 91 kg    Intake/Output: I/O last 3 completed shifts: In: 400 [IV Piggyback:400] Out: -    Intake/Output this shift:  No intake/output data recorded.  Physical Exam: General: NAD,  sitting in chair  Head: Normocephalic, atraumatic. Moist oral mucosal membranes  Eyes: Anicteric  Lungs:  Clear to auscultation, normal effort  Heart: Regular rate and rhythm  Abdomen:  Soft, nontender, distended  Extremities: No edema  Neurologic: Alert, moving all four extremities  Skin: No lesions  Access: Rt AVG    Basic Metabolic Panel: Recent Labs  Lab 08/17/21 0449 08/18/21 0416 08/19/21 0433 08/21/21 0630 08/23/21 0606  NA 135 136 138 141 139  K 3.9 4.7 4.8 5.0 5.8*  CL 96* 95* 96* 99 97*  CO2 30 27 28 29 25   GLUCOSE 95 86 85 103* 57*  BUN 38* 50* 65* 63* 63*  CREATININE 7.01* 8.54* 9.93* 9.88* 9.01*  CALCIUM 10.2 11.1* 10.8* 10.6* 10.3  MG 1.9 2.1 2.3  --   --   PHOS  --   --   --  7.7*  --      Liver Function Tests: No results for input(s): AST, ALT, ALKPHOS, BILITOT, PROT, ALBUMIN in the  last 168 hours.  No results for input(s): LIPASE, AMYLASE in the last 168 hours. No results for input(s): AMMONIA in the last 168 hours.  CBC: Recent Labs  Lab 08/17/21 0449 08/18/21 0416 08/19/21 0433 08/21/21 0630 08/23/21 0606  WBC 7.7 9.0 8.6 10.4 17.2*  NEUTROABS  --   --   --   --  15.0*  HGB 7.8* 8.0* 8.7* 8.3* 10.2*  HCT 24.5* 26.2* 27.3* 28.2* 33.3*  MCV 96.1 94.6 98.6 97.9 98.2  PLT 195 215 233 192 240     Cardiac Enzymes: No results for input(s): CKTOTAL, CKMB, CKMBINDEX, TROPONINI in the last 168 hours.  BNP: Invalid input(s): POCBNP  CBG: No results for input(s): GLUCAP in the last 168 hours.   Microbiology: Results for orders placed or performed during the hospital encounter of 08/08/21  Resp Panel by RT-PCR (Flu A&B, Covid) Nasopharyngeal Swab     Status: None   Collection Time: 08/08/21  1:22 PM   Specimen: Nasopharyngeal Swab; Nasopharyngeal(NP) swabs in vial transport medium  Result Value Ref Range Status   SARS Coronavirus 2 by RT PCR NEGATIVE NEGATIVE Final    Comment: (NOTE) SARS-CoV-2 target nucleic acids are NOT DETECTED.  The SARS-CoV-2 RNA is generally detectable in upper respiratory specimens during the acute phase of infection. The lowest concentration of SARS-CoV-2 viral copies this assay can detect is 138 copies/mL. A negative result  does not preclude SARS-Cov-2 infection and should not be used as the sole basis for treatment or other patient management decisions. A negative result may occur with  improper specimen collection/handling, submission of specimen other than nasopharyngeal swab, presence of viral mutation(s) within the areas targeted by this assay, and inadequate number of viral copies(<138 copies/mL). A negative result must be combined with clinical observations, patient history, and epidemiological information. The expected result is Negative.  Fact Sheet for Patients:  EntrepreneurPulse.com.au  Fact  Sheet for Healthcare Providers:  IncredibleEmployment.be  This test is no t yet approved or cleared by the [place] FDA and  has been authorized for detection and/or diagnosis of SARS-CoV-2 by FDA under an Emergency Use Authorization (EUA). This EUA will remain  in effect (meaning this test can be used) for the duration of the COVID-19 declaration under Section 564(b)(1) of the Act, 21 U.S.C.section 360bbb-3(b)(1), unless the authorization is terminated  or revoked sooner.       Influenza A by PCR NEGATIVE NEGATIVE Final   Influenza B by PCR NEGATIVE NEGATIVE Final    Comment: (NOTE) The Xpert Xpress SARS-CoV-2/FLU/RSV plus assay is intended as an aid in the diagnosis of influenza from Nasopharyngeal swab specimens and should not be used as a sole basis for treatment. Nasal washings and aspirates are unacceptable for Xpert Xpress SARS-CoV-2/FLU/RSV testing.  Fact Sheet for Patients: EntrepreneurPulse.com.au  Fact Sheet for Healthcare Providers: IncredibleEmployment.be  This test is not yet approved or cleared by the [place] FDA and has been authorized for detection and/or diagnosis of SARS-CoV-2 by FDA under an Emergency Use Authorization (EUA). This EUA will remain in effect (meaning this test can be used) for the duration of the COVID-19 declaration under Section 564(b)(1) of the Act, 21 U.S.C. section 360bbb-3(b)(1), unless the authorization is terminated or revoked.  Performed at [facility], [address] [place], [place] [address]   Culture, blood (Routine X 2) w Reflex to ID Panel     Status: Abnormal   Collection Time: 08/11/21  5:47 PM   Specimen: BLOOD  Result Value Ref Range Status   Specimen Description   Final    BLOOD LEFT HAND Performed at [facility], [address], [place], [place] [address]    Special Requests   Final    BOTTLES DRAWN AEROBIC AND ANAEROBIC Blood  Culture adequate volume Performed at [facility], [address] [place], [place] [address]    Culture  Setup Time   Final    GRAM POSITIVE COCCI IN CHAINS IN BOTH AEROBIC AND ANAEROBIC BOTTLES Gram Stain Report Called to,Read Back By and Verified With: JASON ROBBINS 08/12/21 @ 0507 BY SB Performed at [facility], [address] [place], [place] [address]    Culture (A)  Final    ENTEROCOCCUS FAECALIS SUSCEPTIBILITIES PERFORMED ON PREVIOUS CULTURE WITHIN THE LAST 5 DAYS. Performed at [facility], [place] [address], [place], [place] [address]    Report Status 08/14/2021 FINAL  Final  Culture, blood (Routine X 2) w Reflex to ID Panel     Status: Abnormal   Collection Time: 08/11/21  5:47 PM   Specimen: BLOOD  Result Value Ref Range Status   Specimen Description   Final    BLOOD FOA Performed at [facility], [address], [place], [place] [address]    Special Requests   Final    BOTTLES DRAWN AEROBIC AND ANAEROBIC Blood Culture adequate volume Performed at [facility], [place], [place] [address]    Culture  Setup Time   Final    GRAM POSITIVE COCCI IN CHAINS IN BOTH AEROBIC AND ANAEROBIC BOTTLES Gram Stain Report Called to,Read Back By and Verified With: JASON ROBBINS 08/12/21 @ 0507 BY SB Performed at [facility], [place] [address], [place], [place] [address]    Culture ENTEROCOCCUS FAECALIS (A)  Final   Report Status 08/14/2021 FINAL  Final   Organism ID, Bacteria ENTEROCOCCUS FAECALIS  Final      Susceptibility   Enterococcus faecalis - MIC*    AMPICILLIN <=2 SENSITIVE Sensitive     VANCOMYCIN 2 SENSITIVE Sensitive     GENTAMICIN SYNERGY SENSITIVE Sensitive     * ENTEROCOCCUS FAECALIS  Blood Culture ID Panel (Reflexed)     Status: Abnormal   Collection Time: 08/11/21  5:47 PM  Result Value Ref Range Status   Enterococcus faecalis DETECTED (A) NOT DETECTED Final    Comment: CRITICAL RESULT CALLED TO, READ  BACK BY AND VERIFIED WITH: JASON ROBBINS 08/12/21 @ 0507 BY SB    Enterococcus Faecium NOT DETECTED NOT DETECTED Final   Listeria monocytogenes NOT DETECTED NOT DETECTED Final   Staphylococcus species NOT DETECTED NOT DETECTED Final   Staphylococcus aureus (BCID) NOT DETECTED NOT DETECTED Final   Staphylococcus epidermidis NOT DETECTED NOT DETECTED Final   Staphylococcus lugdunensis NOT DETECTED NOT DETECTED Final   Streptococcus species NOT DETECTED NOT DETECTED Final   Streptococcus agalactiae NOT DETECTED NOT DETECTED Final   Streptococcus pneumoniae NOT DETECTED NOT DETECTED Final   Streptococcus pyogenes NOT DETECTED NOT DETECTED Final   A.calcoaceticus-baumannii NOT DETECTED NOT DETECTED Final   Bacteroides fragilis NOT DETECTED NOT DETECTED Final   Enterobacterales NOT DETECTED NOT DETECTED Final   Enterobacter cloacae complex NOT DETECTED NOT DETECTED Final   Escherichia coli NOT DETECTED NOT DETECTED Final   Klebsiella aerogenes NOT DETECTED NOT DETECTED Final   Klebsiella oxytoca NOT DETECTED NOT DETECTED Final   Klebsiella pneumoniae NOT DETECTED NOT DETECTED Final   Proteus species NOT DETECTED NOT DETECTED Final   Salmonella species NOT DETECTED NOT DETECTED Final   Serratia marcescens NOT DETECTED NOT DETECTED Final   Haemophilus influenzae NOT DETECTED NOT DETECTED Final   Neisseria meningitidis NOT DETECTED NOT DETECTED Final   Pseudomonas aeruginosa NOT DETECTED NOT DETECTED Final   Stenotrophomonas maltophilia NOT DETECTED NOT DETECTED Final   Candida albicans NOT DETECTED NOT DETECTED Final   Candida auris NOT DETECTED NOT DETECTED Final   Candida glabrata NOT DETECTED NOT DETECTED Final   Candida krusei NOT DETECTED NOT DETECTED Final   Candida parapsilosis NOT DETECTED NOT DETECTED Final   Candida tropicalis NOT DETECTED NOT DETECTED Final   Cryptococcus neoformans/gattii NOT DETECTED NOT DETECTED Final   Vancomycin resistance NOT DETECTED NOT DETECTED Final     Comment: Performed at [facility], [address] [place], [place] [address]  CULTURE, BLOOD (ROUTINE X 2) w Reflex to ID Panel     Status: None   Collection Time: 08/12/21 11:58 PM   Specimen: BLOOD  Result Value Ref Range Status   Specimen Description BLOOD BLOOD LEFT HAND  Final   Special Requests   Final    BOTTLES DRAWN AEROBIC AND ANAEROBIC Blood Culture adequate volume   Culture   Final    NO GROWTH 5 DAYS Performed at [facility], [address] [place], [place] [address]    Report Status 08/18/2021 FINAL  Final  CULTURE, BLOOD (ROUTINE X 2) w Reflex to ID Panel     Status: None   Collection Time: 08/12/21 11:58 PM   Specimen: BLOOD  Result Value Ref Range Status   Specimen Description BLOOD LEFT RADIAL  Final   Special Requests   Final    BOTTLES DRAWN AEROBIC AND ANAEROBIC Blood Culture results may not be optimal due to an inadequate volume of blood received in culture bottles   Culture   Final    NO GROWTH 5 DAYS Performed at [facility], [address] [place], [place] [address]    Report Status 08/18/2021 FINAL  Final  Resp Panel by RT-PCR (Flu A&B, Covid) Nasopharyngeal Swab     Status: None   Collection Time: 08/21/21  9:50 AM   Specimen: Nasopharyngeal Swab; Nasopharyngeal(NP) swabs in vial transport medium  Result Value Ref Range Status   SARS Coronavirus 2 by RT PCR NEGATIVE NEGATIVE Final    Comment: (NOTE) SARS-CoV-2 target nucleic acids are NOT DETECTED.  The SARS-CoV-2 RNA is generally detectable in upper respiratory specimens during the acute phase of infection. The lowest concentration of SARS-CoV-2 viral copies this assay can detect is 138 copies/mL. A negative result does not preclude SARS-Cov-2 infection and should not be used as the sole basis for treatment or other patient management decisions. A negative result may occur with  improper specimen collection/handling, submission of specimen other than  nasopharyngeal swab, presence of viral mutation(s) within the areas targeted by this assay, and inadequate number of viral copies(<138 copies/mL). A negative result must be combined with clinical observations, patient history, and epidemiological information. The expected result is Negative.  Fact Sheet for Patients:  EntrepreneurPulse.com.au  Fact Sheet for Healthcare Providers:  IncredibleEmployment.be  This test is no t yet approved or cleared by the [place] FDA and  has been authorized for detection and/or diagnosis of SARS-CoV-2 by FDA under an Emergency Use Authorization (EUA). This EUA will remain  in effect (meaning this test can be used) for the duration of the COVID-19 declaration under Section 564(b)(1) of the Act, 21 U.S.C.section 360bbb-3(b)(1), unless the authorization is terminated  or revoked sooner.       Influenza A by PCR NEGATIVE NEGATIVE Final   Influenza B by PCR NEGATIVE NEGATIVE Final    Comment: (NOTE) The Xpert Xpress SARS-CoV-2/FLU/RSV plus assay is intended as an aid in the diagnosis of influenza from Nasopharyngeal swab specimens and should not be used as a sole basis for treatment. Nasal washings and aspirates are unacceptable for Xpert Xpress SARS-CoV-2/FLU/RSV testing.  Fact Sheet for Patients: EntrepreneurPulse.com.au  Fact Sheet for Healthcare Providers: IncredibleEmployment.be  This test is not yet approved or cleared by the [place] FDA and has been authorized for detection and/or diagnosis of SARS-CoV-2 by FDA under an Emergency Use Authorization (EUA). This EUA will remain in effect (meaning this test can be used) for the duration of the COVID-19 declaration under Section 564(b)(1) of the Act, 21 U.S.C. section 360bbb-3(b)(1), unless the authorization is terminated or revoked.  Performed at [facility], [address] [place], [place]  [address]     Coagulation Studies: No results for input(s): LABPROT, INR in the last 72 hours.  Urinalysis: No results for input(s): COLORURINE, LABSPEC, PHURINE, GLUCOSEU, HGBUR, BILIRUBINUR, KETONESUR, PROTEINUR, UROBILINOGEN, NITRITE, LEUKOCYTESUR in the last 72 hours.  Invalid input(s): APPERANCEUR    Imaging: No results found.   Medications:    sodium chloride Stopped (08/21/21 0557)   ampicillin (OMNIPEN) IV  2 g (08/23/21 3875)   cefTRIAXone (ROCEPHIN)  IV 2 g (08/22/21 2251)     apixaban  2.5 mg Oral BID   Chlorhexidine Gluconate Cloth  6 each Topical Q0600   epoetin (EPOGEN/PROCRIT) injection  4,000 Units Intravenous Q M,W,F-HD   feeding supplement (NEPRO CARB STEADY)  237 mL Oral TID BM   lidocaine  2 patch Transdermal Q24H   nicotine  21 mg Transdermal Daily   senna-docusate  1 tablet Oral BID   sevelamer carbonate  1,600 mg Oral TID WC   sodium chloride, acetaminophen, bisacodyl, hydrALAZINE, LORazepam, methocarbamol, Muscle Rub, ondansetron (ZOFRAN) IV, oxyCODONE-acetaminophen, polyethylene glycol, sodium chloride flush  Assessment/ Plan:  Mr. DEL OVERFELT is a 60 y.o. black male with end stage renal disease on hemodialysis, hypertension, congestive heart failure, substance abuse who is admitted to [facility] 08/08/2021 for Lower back pain [M54.50] Acute midline low back pain without sciatica [M54.50] At risk for inadequate pain control [Z91.89] Back pain [M54.9] Found to have eneterococcus faecalis bacteremia and osteomyelitis of C5-C6.   [facility] MWF [facility] Right AVG Hero 90.5kg  End Stage Renal Disease:  Continue MWF schedule.Currently receiving dialysis in a chair. Tolerating well. Next treatment scheduled for Friday.   Hypertension: stable 135/49. Holding home dose of amlodipine.   Anemia of chronic kidney disease: hemoglobin 8.3 - Low dose EPO with HD treatments  Secondary Hyperparathyroidism: with hypercalcemia and hyperphosphatemia.   -  Sevelamer with meals.   Osteomyelitis: C5-C6  and L4-L5  - ampicillin and ceftriaxone BID for a total of 6 weeks. End Date is 09/23/2021.   Dispo: Patient placed in chair for dialysis today and tolerated well.    LOS: [place] 9/14/202210:09 AM

## 2021-08-23 NOTE — Progress Notes (Signed)
Hemodialysis:  Tolerated dialyzing in dialysis chair. Difficulty with cannulation arterial site(x3 times). Removed 1.5liters net fluid. Patient arrived to dialysis with no oxygen connected and saturations in 80s, nasal cannula was placed at 3liters, then titrated down to 2liters for o2 saturations in 90s. No distress noted post treatment.   Post Vitals: Temp=98 Pulse=80 B/P=124/55 O2=100% on 2liters nasal cannula

## 2021-08-23 NOTE — Progress Notes (Signed)
OT Cancellation Note  Patient Details Name: [name] [name] MRN: [id number] DOB: April 06, 1961   Cancelled Treatment:    Reason Eval/Treat Not Completed: Patient at procedure or test/ unavailable (Attempted OT treatment, however pt. is out of his room at dialysis this a.m. WIll reattempt at a later time, or date.)  Harrel Carina, MS, OTR/L 08/23/2021, 9:34 AM

## 2021-08-23 NOTE — TOC Progression Note (Signed)
Transition of Care [facility]) - Progression Note    Patient Details  Name: [name] [name] MRN: [id number] Date of Birth: [birth year]  Transition of [facility] CM/SW Contact  Su Hilt, RN Phone Number: 08/23/2021, 3:51 PM  Clinical Narrative:    Chair time is TTS at noon [facility] They can start tomorrow, If they do not start tomorrow they will not start until Tuesday, I contacted [place] and  left a voice mail with this information, no call back so unable to DC today to start Dialysis at [facility] on Friday, Plan to DC Friday after Dialysis to be able to start [facility] on Tuesday        Expected Discharge Plan and Services           Expected Discharge Date: 08/21/21                                     Social Determinants of Health (SDOH) Interventions    Readmission Risk Interventions No flowsheet data found.

## 2021-08-23 NOTE — Progress Notes (Signed)
No adverse events during shift, continues on scheduled antibiotic therapy. Vitals stable will continue to monitor. Call received from patients parents for status update. Patients stable at the end of shift, will continue to monitor.

## 2021-08-24 DIAGNOSIS — D631 Anemia in chronic kidney disease: Secondary | ICD-10-CM | POA: Diagnosis not present

## 2021-08-24 DIAGNOSIS — N186 End stage renal disease: Secondary | ICD-10-CM | POA: Diagnosis not present

## 2021-08-24 DIAGNOSIS — M5440 Lumbago with sciatica, unspecified side: Secondary | ICD-10-CM | POA: Diagnosis not present

## 2021-08-24 DIAGNOSIS — R7881 Bacteremia: Secondary | ICD-10-CM | POA: Diagnosis not present

## 2021-08-24 LAB — CBC
HCT: 30.1 % — ABNORMAL LOW (ref 39.0–52.0)
Hemoglobin: 9.7 g/dL — ABNORMAL LOW (ref 13.0–17.0)
MCH: 31.7 pg (ref 26.0–34.0)
MCHC: 32.2 g/dL (ref 30.0–36.0)
MCV: 98.4 fL (ref 80.0–100.0)
Platelets: 253 10*3/uL (ref 150–400)
RBC: 3.06 MIL/uL — ABNORMAL LOW (ref 4.22–5.81)
RDW: 19.9 % — ABNORMAL HIGH (ref 11.5–15.5)
WBC: 12.5 10*3/uL — ABNORMAL HIGH (ref 4.0–10.5)
nRBC: 2.6 % — ABNORMAL HIGH (ref 0.0–0.2)

## 2021-08-24 LAB — RENAL FUNCTION PANEL
Albumin: 2.5 g/dL — ABNORMAL LOW (ref 3.5–5.0)
Anion gap: 16 — ABNORMAL HIGH (ref 5–15)
BUN: 55 mg/dL — ABNORMAL HIGH (ref 6–20)
CO2: 28 mmol/L (ref 22–32)
Calcium: 10 mg/dL (ref 8.9–10.3)
Chloride: 95 mmol/L — ABNORMAL LOW (ref 98–111)
Creatinine, Ser: 7.13 mg/dL — ABNORMAL HIGH (ref 0.61–1.24)
GFR, Estimated: 8 mL/min — ABNORMAL LOW (ref >60)
Glucose, Bld: 81 mg/dL (ref 70–99)
Phosphorus: 8 mg/dL — ABNORMAL HIGH (ref 2.5–4.6)
Potassium: 4.8 mmol/L (ref 3.5–5.1)
Sodium: 139 mmol/L (ref 135–145)

## 2021-08-24 MED ORDER — MIRTAZAPINE 15 MG PO TABS
15.0000 mg | ORAL_TABLET | Freq: Every day | ORAL | Status: DC
  Administered 2021-08-24: 15 mg via ORAL
  Filled 2021-08-24: qty 1

## 2021-08-24 NOTE — Progress Notes (Signed)
Patient has been accepted at [facility]TS 12:00p. Patient had a start date for today. Since patient was unable to discharge yesterday, start date was changed to Tuesday 9/20 arrive by 11:30am.

## 2021-08-24 NOTE — Progress Notes (Signed)
[facility]  ROUNDING NOTE   Subjective:   Patient was refusing medications, food and nursing care this morning. More agreeable in afternoon.   Hemodialysis treatment yesterday. Tolerated treatment well. Completed entire treatment in chair. UF of 1.5 liters   Objective:  Vital signs in last 24 hours:  Temp:  [97.3 F (36.3 C)-98 F (36.7 C)] 97.3 F (36.3 C) (09/15 0734) Pulse Rate:  [77-90] 83 (09/15 0734) Resp:  [15] 15 (09/15 0734) BP: (105-121)/(45-56) 121/45 (09/15 0734) SpO2:  [95 %-99 %] 99 % (09/15 0734)  Weight change:  Filed Weights   08/08/21 0625 08/16/21 0754  Weight: 91 kg 91 kg    Intake/Output: I/O last 3 completed shifts: In: 900 [IV Piggyback:900] Out: 1500 [Other:1500]   Intake/Output this shift:  No intake/output data recorded.  Physical Exam: General: NAD,  laying in bed  Head: Normocephalic, atraumatic. Moist oral mucosal membranes  Eyes: Anicteric  Lungs:  Clear to auscultation, normal effort  Heart: Regular rate and rhythm  Abdomen:  Soft, nontender, distended  Extremities: No edema  Neurologic: Alert, moving all four extremities  Skin: No lesions  Access: Rt AVG    Basic Metabolic Panel: Recent Labs  Lab 08/18/21 0416 08/19/21 0433 08/21/21 0630 08/23/21 0606 08/24/21 0503  NA 136 138 141 139 139  K 4.7 4.8 5.0 5.8* 4.8  CL 95* 96* 99 97* 95*  CO2 27 28 29 25 28   GLUCOSE 86 85 103* 57* 81  BUN 50* 65* 63* 63* 55*  CREATININE 8.54* 9.93* 9.88* 9.01* 7.13*  CALCIUM 11.1* 10.8* 10.6* 10.3 10.0  MG 2.1 2.3  --   --   --   PHOS  --   --  7.7*  --  8.0*     Liver Function Tests: Recent Labs  Lab 08/24/21 0503  ALBUMIN 2.5*    No results for input(s): LIPASE, AMYLASE in the last 168 hours. No results for input(s): AMMONIA in the last 168 hours.  CBC: Recent Labs  Lab 08/18/21 0416 08/19/21 0433 08/21/21 0630 08/23/21 0606 08/24/21 0503  WBC 9.0 8.6 10.4 17.2* 12.5*  NEUTROABS  --   --   --  15.0*   --   HGB 8.0* 8.7* 8.3* 10.2* 9.7*  HCT 26.2* 27.3* 28.2* 33.3* 30.1*  MCV 94.6 98.6 97.9 98.2 98.4  PLT 215 233 192 240 253     Cardiac Enzymes: No results for input(s): CKTOTAL, CKMB, CKMBINDEX, TROPONINI in the last 168 hours.  BNP: Invalid input(s): POCBNP  CBG: No results for input(s): GLUCAP in the last 168 hours.   Microbiology: Results for orders placed or performed during the hospital encounter of 08/08/21  Resp Panel by RT-PCR (Flu A&B, Covid) Nasopharyngeal Swab     Status: None   Collection Time: 08/08/21  1:22 PM   Specimen: Nasopharyngeal Swab; Nasopharyngeal(NP) swabs in vial transport medium  Result Value Ref Range Status   SARS Coronavirus 2 by RT PCR NEGATIVE NEGATIVE Final    Comment: (NOTE) SARS-CoV-2 target nucleic acids are NOT DETECTED.  The SARS-CoV-2 RNA is generally detectable in upper respiratory specimens during the acute phase of infection. The lowest concentration of SARS-CoV-2 viral copies this assay can detect is 138 copies/mL. A negative result does not preclude SARS-Cov-2 infection and should not be used as the sole basis for treatment or other patient management decisions. A negative result may occur with  improper specimen collection/handling, submission of specimen other than nasopharyngeal swab, presence of viral mutation(s) within the  areas targeted by this assay, and inadequate number of viral copies(<138 copies/mL). A negative result must be combined with clinical observations, patient history, and epidemiological information. The expected result is Negative.  Fact Sheet for Patients:  EntrepreneurPulse.com.au  Fact Sheet for Healthcare Providers:  IncredibleEmployment.be  This test is no t yet approved or cleared by the [place] FDA and  has been authorized for detection and/or diagnosis of SARS-CoV-2 by FDA under an Emergency Use Authorization (EUA). This EUA will remain  in effect  (meaning this test can be used) for the duration of the COVID-19 declaration under Section 564(b)(1) of the Act, 21 U.S.C.section 360bbb-3(b)(1), unless the authorization is terminated  or revoked sooner.       Influenza A by PCR NEGATIVE NEGATIVE Final   Influenza B by PCR NEGATIVE NEGATIVE Final    Comment: (NOTE) The Xpert Xpress SARS-CoV-2/FLU/RSV plus assay is intended as an aid in the diagnosis of influenza from Nasopharyngeal swab specimens and should not be used as a sole basis for treatment. Nasal washings and aspirates are unacceptable for Xpert Xpress SARS-CoV-2/FLU/RSV testing.  Fact Sheet for Patients: EntrepreneurPulse.com.au  Fact Sheet for Healthcare Providers: IncredibleEmployment.be  This test is not yet approved or cleared by the [place] FDA and has been authorized for detection and/or diagnosis of SARS-CoV-2 by FDA under an Emergency Use Authorization (EUA). This EUA will remain in effect (meaning this test can be used) for the duration of the COVID-19 declaration under Section 564(b)(1) of the Act, 21 U.S.C. section 360bbb-3(b)(1), unless the authorization is terminated or revoked.  Performed at [facility], [address] [place], [place] [address]   Culture, blood (Routine X 2) w Reflex to ID Panel     Status: Abnormal   Collection Time: 08/11/21  5:47 PM   Specimen: BLOOD  Result Value Ref Range Status   Specimen Description   Final    BLOOD LEFT HAND Performed at [facility], [address], [place], [place] [address]    Special Requests   Final    BOTTLES DRAWN AEROBIC AND ANAEROBIC Blood Culture adequate volume Performed at [facility], [address] [place], [place] [address]    Culture  Setup Time   Final    GRAM POSITIVE COCCI IN CHAINS IN BOTH AEROBIC AND ANAEROBIC BOTTLES Gram Stain Report Called to,Read Back By and Verified With: JASON ROBBINS 08/12/21 @ 0507 BY  SB Performed at [facility], [address] [place], [place] [address]    Culture (A)  Final    ENTEROCOCCUS FAECALIS SUSCEPTIBILITIES PERFORMED ON PREVIOUS CULTURE WITHIN THE LAST 5 DAYS. Performed at [facility], [place] [address], [place], [place] [address]    Report Status 08/14/2021 FINAL  Final  Culture, blood (Routine X 2) w Reflex to ID Panel     Status: Abnormal   Collection Time: 08/11/21  5:47 PM   Specimen: BLOOD  Result Value Ref Range Status   Specimen Description   Final    BLOOD FOA Performed at [facility], [address] [place], [place] [address]    Special Requests   Final    BOTTLES DRAWN AEROBIC AND ANAEROBIC Blood Culture adequate volume Performed at [facility], [address] [place],  [address]    Culture  Setup Time   Final    GRAM POSITIVE COCCI IN CHAINS IN BOTH AEROBIC AND ANAEROBIC BOTTLES Gram Stain Report Called to,Read Back By and Verified With: JASON ROBBINS  08/12/21 @ 0507 BY SB Performed at [facility], [place] [address] [place], [place] [address]    Culture ENTEROCOCCUS FAECALIS (A)  Final   Report Status 08/14/2021 FINAL  Final   Organism ID, Bacteria ENTEROCOCCUS FAECALIS  Final      Susceptibility   Enterococcus faecalis - MIC*    AMPICILLIN <=2 SENSITIVE Sensitive     VANCOMYCIN 2 SENSITIVE Sensitive     GENTAMICIN SYNERGY SENSITIVE Sensitive     * ENTEROCOCCUS FAECALIS  Blood Culture ID Panel (Reflexed)     Status: Abnormal   Collection Time: 08/11/21  5:47 PM  Result Value Ref Range Status   Enterococcus faecalis DETECTED (A) NOT DETECTED Final    Comment: CRITICAL RESULT CALLED TO, READ BACK BY AND VERIFIED WITH: JASON ROBBINS 08/12/21 @ 0507 BY SB    Enterococcus Faecium NOT DETECTED NOT DETECTED Final   Listeria monocytogenes NOT DETECTED NOT DETECTED Final   Staphylococcus species NOT DETECTED NOT DETECTED Final   Staphylococcus aureus (BCID) NOT DETECTED NOT DETECTED Final    Staphylococcus epidermidis NOT DETECTED NOT DETECTED Final   Staphylococcus lugdunensis NOT DETECTED NOT DETECTED Final   Streptococcus species NOT DETECTED NOT DETECTED Final   Streptococcus agalactiae NOT DETECTED NOT DETECTED Final   Streptococcus pneumoniae NOT DETECTED NOT DETECTED Final   Streptococcus pyogenes NOT DETECTED NOT DETECTED Final   A.calcoaceticus-baumannii NOT DETECTED NOT DETECTED Final   Bacteroides fragilis NOT DETECTED NOT DETECTED Final   Enterobacterales NOT DETECTED NOT DETECTED Final   Enterobacter cloacae complex NOT DETECTED NOT DETECTED Final   Escherichia coli NOT DETECTED NOT DETECTED Final   Klebsiella aerogenes NOT DETECTED NOT DETECTED Final   Klebsiella oxytoca NOT DETECTED NOT DETECTED Final   Klebsiella pneumoniae NOT DETECTED NOT DETECTED Final   Proteus species NOT DETECTED NOT DETECTED Final   Salmonella species NOT DETECTED NOT DETECTED Final   Serratia marcescens NOT DETECTED NOT DETECTED Final   Haemophilus influenzae NOT DETECTED NOT DETECTED Final   Neisseria meningitidis NOT DETECTED NOT DETECTED Final   Pseudomonas aeruginosa NOT DETECTED NOT DETECTED Final   Stenotrophomonas maltophilia NOT DETECTED NOT DETECTED Final   Candida albicans NOT DETECTED NOT DETECTED Final   Candida auris NOT DETECTED NOT DETECTED Final   Candida glabrata NOT DETECTED NOT DETECTED Final   Candida krusei NOT DETECTED NOT DETECTED Final   Candida parapsilosis NOT DETECTED NOT DETECTED Final   Candida tropicalis NOT DETECTED NOT DETECTED Final   Cryptococcus neoformans/gattii NOT DETECTED NOT DETECTED Final   Vancomycin resistance NOT DETECTED NOT DETECTED Final    Comment: Performed at [facility], [address] [place], [place] [address]  CULTURE, BLOOD (ROUTINE X 2) w Reflex to ID Panel     Status: None   Collection Time: 08/12/21 11:58 PM   Specimen: BLOOD  Result Value Ref Range Status   Specimen Description BLOOD BLOOD LEFT HAND  Final    Special Requests   Final    BOTTLES DRAWN AEROBIC AND ANAEROBIC Blood Culture adequate volume   Culture   Final    NO GROWTH 5 DAYS Performed at [facility], [address] [place],  [address]    Report Status 08/18/2021 FINAL  Final  CULTURE, BLOOD (ROUTINE X 2) w Reflex to ID Panel     Status: None   Collection Time: 08/12/21 11:58 PM   Specimen: BLOOD  Result Value Ref Range Status   Specimen Description BLOOD LEFT RADIAL  Final  Special Requests   Final    BOTTLES DRAWN AEROBIC AND ANAEROBIC Blood Culture results may not be optimal due to an inadequate volume of blood received in culture bottles   Culture   Final    NO GROWTH 5 DAYS Performed at [facility], [address] [place], [place] [address]    Report Status 08/18/2021 FINAL  Final  Resp Panel by RT-PCR (Flu A&B, Covid) Nasopharyngeal Swab     Status: None   Collection Time: 08/21/21  9:50 AM   Specimen: Nasopharyngeal Swab; Nasopharyngeal(NP) swabs in vial transport medium  Result Value Ref Range Status   SARS Coronavirus 2 by RT PCR NEGATIVE NEGATIVE Final    Comment: (NOTE) SARS-CoV-2 target nucleic acids are NOT DETECTED.  The SARS-CoV-2 RNA is generally detectable in upper respiratory specimens during the acute phase of infection. The lowest concentration of SARS-CoV-2 viral copies this assay can detect is 138 copies/mL. A negative result does not preclude SARS-Cov-2 infection and should not be used as the sole basis for treatment or other patient management decisions. A negative result may occur with  improper specimen collection/handling, submission of specimen other than nasopharyngeal swab, presence of viral mutation(s) within the areas targeted by this assay, and inadequate number of viral copies(<138 copies/mL). A negative result must be combined with clinical observations, patient history, and epidemiological information. The expected result is Negative.  Fact Sheet for  Patients:  EntrepreneurPulse.com.au  Fact Sheet for Healthcare Providers:  IncredibleEmployment.be  This test is no t yet approved or cleared by the [place] FDA and  has been authorized for detection and/or diagnosis of SARS-CoV-2 by FDA under an Emergency Use Authorization (EUA). This EUA will remain  in effect (meaning this test can be used) for the duration of the COVID-19 declaration under Section 564(b)(1) of the Act, 21 U.S.C.section 360bbb-3(b)(1), unless the authorization is terminated  or revoked sooner.       Influenza A by PCR NEGATIVE NEGATIVE Final   Influenza B by PCR NEGATIVE NEGATIVE Final    Comment: (NOTE) The Xpert Xpress SARS-CoV-2/FLU/RSV plus assay is intended as an aid in the diagnosis of influenza from Nasopharyngeal swab specimens and should not be used as a sole basis for treatment. Nasal washings and aspirates are unacceptable for Xpert Xpress SARS-CoV-2/FLU/RSV testing.  Fact Sheet for Patients: EntrepreneurPulse.com.au  Fact Sheet for Healthcare Providers: IncredibleEmployment.be  This test is not yet approved or cleared by the [place] FDA and has been authorized for detection and/or diagnosis of SARS-CoV-2 by FDA under an Emergency Use Authorization (EUA). This EUA will remain in effect (meaning this test can be used) for the duration of the COVID-19 declaration under Section 564(b)(1) of the Act, 21 U.S.C. section 360bbb-3(b)(1), unless the authorization is terminated or revoked.  Performed at [facility], [address] [place], [place] [address]     Coagulation Studies: No results for input(s): LABPROT, INR in the last 72 hours.  Urinalysis: No results for input(s): COLORURINE, LABSPEC, PHURINE, GLUCOSEU, HGBUR, BILIRUBINUR, KETONESUR, PROTEINUR, UROBILINOGEN, NITRITE, LEUKOCYTESUR in the last 72 hours.  Invalid input(s): APPERANCEUR     Imaging: No results found.   Medications:    sodium chloride Stopped (08/21/21 0557)   ampicillin (OMNIPEN) IV Stopped (08/24/21 0539)   cefTRIAXone (ROCEPHIN)  IV 2 g (08/24/21 1051)     apixaban  2.5 mg Oral BID   Chlorhexidine Gluconate Cloth  6 each Topical Q0600   epoetin (EPOGEN/PROCRIT) injection  4,000 Units Intravenous Q M,W,F-HD  feeding supplement (NEPRO CARB STEADY)  237 mL Oral TID BM   lidocaine  2 patch Transdermal Q24H   nicotine  21 mg Transdermal Daily   senna-docusate  1 tablet Oral BID   sevelamer carbonate  1,600 mg Oral TID WC   sodium chloride, acetaminophen, bisacodyl, hydrALAZINE, LORazepam, methocarbamol, Muscle Rub, ondansetron (ZOFRAN) IV, oxyCODONE-acetaminophen, polyethylene glycol, sodium chloride flush  Assessment/ Plan:  [name] is a 60 y.o. black male with end stage renal disease on hemodialysis, hypertension, congestive heart failure, substance abuse who is admitted to [facility] on 08/08/2021 for Lower back pain [M54.50] Acute midline low back pain without sciatica [M54.50] At risk for inadequate pain control [Z91.89] Back pain [M54.9] Found to have eneterococcus faecalis bacteremia and osteomyelitis of C5-C6.   [facility] M[facility] Right AVG Hero 90.5kg  End Stage Renal Disease:  Dialysis for tomorrow and then he will be converted to TTS schedule.   Hypertension: 121/45.  Holding home dose of amlodipine.   Anemia of chronic kidney disease: hemoglobin 9.7 - EPO with HD treatments  Secondary Hyperparathyroidism: with hypercalcemia and hyperphosphatemia. On Etelcalcetide as outpatient.  - Sevelamer with meals.   Osteomyelitis: C5-C6  and L4-L5  - ampicillin 2 g bid and ceftriaxone 2g BID for a total of 6 weeks. End Date is 09/23/2021.  - He will need PO amoxacillin afterwards.    LOS: 15 Dacia Capers 9/15/20221:42 PM

## 2021-08-24 NOTE — Progress Notes (Signed)
PT with very poor appetite throughout shift. PT aware and educated on the importance of taking Sevelamer Carbonate with each meal.This nurse encouraged pt to eat and to increase oral intake when each meal arrived to room. PT continued to refuse. Nephrologist made aware.

## 2021-08-24 NOTE — Care Management Important Message (Signed)
Important Message  Patient Details  Name: [name] [name] MRN: [id number] Date of Birth: [birth year]   Medicare Important Message Given:  Yes     [name] 08/24/2021, 10:30 AM

## 2021-08-24 NOTE — Progress Notes (Signed)
Occupational Therapy Treatment Patient Details Name: FERGUSON GERTNER MRN: [id number] DOB: 12/10/1961 Today's Date: 08/24/2021   History of present illness Pt is a 60 y.o. male with medical history significant of ESRD-HD (MWF), hypertension, tobacco abuse, polysubstance abuse, diverticulitis, anemia, jugular vein thrombosis, GI bleeding, right pneumothorax, who presents with lower back pain. 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   OT comments  Mr. Ewing presents today with fatigue, lethargy, 9/10 pain, disorientation, and impaired balance. He is able to engage in bed mobility with Min A -- an improvement from previous sessions -- but requires lengthy rest breaks between rolling and sitting. After ~ 2 mins sitting EOB, pt becomes extremely tired and falls into a posterior lean, requiring Max A to return to EOB sitting. Pt is unable to assist with LB dressing and with scooting in bed (Max/Total A from therapist). Will continue to follow POC, assisting pt to build strength, endurance, and balance, and to progress to standing and t/fs to recliner and BSC.   Recommendations for follow up therapy are one component of a multi-disciplinary discharge planning process, led by the attending physician.  Recommendations may be updated based on patient status, additional functional criteria and insurance authorization.    Follow Up Recommendations  SNF    Equipment Recommendations  None recommended by OT    Recommendations for Other Services      Precautions / Restrictions Precautions Precautions: Fall Precaution Comments: R UE AV fistula Restrictions Weight Bearing Restrictions: No       Mobility Bed Mobility Overal bed  mobility: Needs Assistance Bed Mobility: Rolling;Sidelying to Sit;Sit to Sidelying Rolling: Min assist Sidelying to sit: Min assist     Sit to sidelying: Min assist General bed mobility comments: Min A for bed mobility, w/ long rest breaks between each step    Transfers                 General transfer comment: deferred    Balance Overall balance assessment: Needs assistance Sitting-balance support: No upper extremity supported;Feet supported Sitting balance-Leahy Scale: Fair Sitting balance - Comments: pt sat EOB with good balance for ~ 2 minutes, then suddenly went into posterior lean, 2/2 fatigue Postural control: Posterior lean                                 ADL either performed or assessed with clinical judgement   ADL Overall ADL's : Needs assistance/impaired                     Lower Body Dressing: Sitting/lateral leans;Total assistance Lower Body Dressing Details (indicate cue type and reason): Pt unable to participate at all in donning socks, 2/2 pain                     Vision       Perception     Praxis      Cognition Arousal/Alertness: Lethargic Behavior During Therapy: Flat affect Overall Cognitive Status: No family/caregiver present to determine baseline cognitive functioning  General Comments: Pt is A and oriented to hospital however poor insight of deficits and poor awareness of safety concerns        Exercises Other Exercises Other Exercises: Static unsupported sitting at EOB, to improve sitting balance tolerance and in preparation for transfer to [facility]   Shoulder Instructions       General Comments      Pertinent Vitals/ Pain       Pain Assessment: 0-10 Pain Score: 9  Pain Location: L thigh and low back Pain Intervention(s): Limited activity within patient's tolerance;Repositioned;Monitored during session  Home Living                                           Prior Functioning/Environment              Frequency  Min 1X/week        Progress Toward Goals  OT Goals(current goals can now be found in the care plan section)  Progress towards OT goals: Progressing toward goals  Acute Rehab OT Goals Patient Stated Goal: none stated OT Goal Formulation: With patient Time For Goal Achievement: 09-29-2021 Potential to Achieve Goals: Fair  Plan Frequency remains appropriate    Co-evaluation                 AM-PAC OT "6 Clicks" Daily Activity     Outcome Measure   Help from another person eating meals?: A Little Help from another person taking care of personal grooming?: A Little Help from another person toileting, which includes using toliet, bedpan, or urinal?: A Lot Help from another person bathing (including washing, rinsing, drying)?: A Lot Help from another person to put on and taking off regular upper body clothing?: A Lot Help from another person to put on and taking off regular lower body clothing?: A Lot 6 Click Score: 14    End of Session Equipment Utilized During Treatment: Gait belt;Rolling walker  OT Visit Diagnosis: Muscle weakness (generalized) (M62.81);Pain Pain - Right/Left: Left Pain - part of body: Leg   Activity Tolerance Patient limited by pain   Patient Left in bed;with call bell/phone within reach;with bed alarm set   Nurse Communication          Time: 6384-5364 OT Time Calculation (min): 19 min  Charges: OT General Charges $OT Visit: 1 Visit OT Treatments $Self Care/Home Management : 8-22 mins  Josiah Lobo, PhD, MS, OTR/L 08/24/21, 3:27 PM

## 2021-08-24 NOTE — Progress Notes (Signed)
Patient ID: [name] [name], male   DOB: Nov 12, 1961, 60 y.o.   MRN: [id number]  PROGRESS NOTE    [name] [name]  URK:[id number] DOB: 1961-07-05 DOA: 08/08/2021 PCP: Pcp, No   Brief Narrative:  60 y.o. male with medical history significant of ESRD-HD, hypertension, tobacco abuse, polysubstance abuse, diverticulitis, anemia, jugular vein thrombosis, GI bleeding, right pneumothorax presented with worsening lower back pain.  He was found to have sepsis secondary to Enterococcus faecalis bacteremia.  ID was consulted and he has been on ampicillin and ceftriaxone.  He was found to have possible developing osteomyelitis in C5-C6 with possible early left psoas abscess formation and possible early discitis in L4-L5.  Nephrology was also consulted for continuation of dialysis.  TEE showed aortic valve endocarditis.  PT recommended SNF placement.  He is currently medically stable for discharge.  He was supposed to be discharged to SNF on 08/21/2021 but discharge was held because he could not sit up for dialysis.  Assessment & Plan:   Sepsis: Possibly evolving on admission Enterococcus faecalis bacteremia Aortic valve endocarditis -ID following.  Currently on Rocephin and ampicillin. -TTE negative for vegetations. 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  ID is recommending 6 to weeks of ampicillin and Rocephin till 09/23/2021 followed by p.o. amoxicillin for a few weeks.  -Status post tunneled catheter placement on 08/18/2021 by IR -Blood cultures from 08/11/2021 grew E faecalis.  Blood cultures from 08/12/2021 have been negative so far. -Sepsis has resolved.  Currently hemodynamically stable with no temperature spikes.  Severe lower back pain Possible developing osteomyelitis in C5-C6 Left psoas muscle infection with superimposed 1.3 cm phlegmon/early abscess formation Possible early discitis in L4-L5 -Continue antibiotics as above.  Continue pain management and bowel regimen -Patient reports that his  pain is not any worse.  WBC count trending down and he has been afebrile.  We will hold off on further imaging for now  End-stage renal disease on hemodialysis -Nephrology following.  Dialysis as per nephrology schedule -Dialysis schedule changed from M WF to TTS -We will need to ensure that patient is able to set up for entire dialysis session prior to discharge  Anemia of chronic disease -From renal failure.  Hemoglobin stable.  Monitor intermittently.  Polysubstance abuse and tobacco abuse -Patient was counseled regarding cessation by prior hospitalist.  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 Keturah Barre recommendations.  Continue Eliquis  Hypertension Hypotension -Blood pressure currently stable.  Off amlodipine  Chronic combined CHF -LVEF of 30 to 35% as per TTE on 08/13/2021.  Volume managed by dialysis.  Strict input output.  Daily weights.  Fluid restriction  Depression -Patient's mother reports that patient has been depressed for the past 2 years since the loss of his child -His p.o. intake has been poor, he does not engage with staff -He denies suicidal ideations -We will start on Remeron -Will need outpatient psychiatry follow-up  Generalized conditioning -PT/OT recommend SNF placement.  Social worker following.  DVT prophylaxis: Eliquis Code Status: Full Family Communication: Updated patient's mother and father over the phone Disposition Plan: Status is: Inpatient  Remains inpatient appropriate because:Inpatient level of care appropriate due to severity of illness  Dispo: The patient is from: Home              Anticipated d/c is to: SNF  Patient currently is medically stable to d/c.    Difficult to place patient No  Consultants: ID/cardiology/nephrology  Procedures: TTE/TEE Tunneled catheter placement by IR on  08/18/2021  Antimicrobials:  Anti-infectives (From admission, onward)    Start     Dose/Rate Route Frequency Ordered Stop   08/21/21 0000  ampicillin IVPB        2 g Intravenous Every 12 hours 08/21/21 0940 09/27/21 2359   08/21/21 0000  cefTRIAXone (ROCEPHIN) IVPB        2 g Intravenous Every 12 hours 08/21/21 0940 09/27/21 2359   08/14/21 1530  cefTRIAXone (ROCEPHIN) 2 g in sodium chloride 0.9 % 100 mL IVPB        2 g 200 mL/hr over 30 Minutes Intravenous Every 12 hours 08/14/21 1443     08/14/21 1200  vancomycin (VANCOCIN) IVPB 1000 mg/200 mL premix  Status:  Discontinued        1,000 mg 200 mL/hr over 60 Minutes Intravenous Every M-W-F (Hemodialysis) 08/11/21 1831 08/12/21 0517   08/12/21 2200  cefTRIAXone (ROCEPHIN) 2 g in sodium chloride 0.9 % 100 mL IVPB  Status:  Discontinued        2 g 200 mL/hr over 30 Minutes Intravenous Every 12 hours 08/12/21 1923 08/14/21 1443   08/12/21 0615  ampicillin (OMNIPEN) 2 g in sodium chloride 0.9 % 100 mL IVPB        2 g 300 mL/hr over 20 Minutes Intravenous Every 12 hours 08/12/21 0520     08/11/21 1930  vancomycin (VANCOREADY) IVPB 2000 mg/400 mL        2,000 mg 200 mL/hr over 120 Minutes Intravenous  Once 08/11/21 1831 08/12/21 0156   08/11/21 1900  cefTRIAXone (ROCEPHIN) 2 g in sodium chloride 0.9 % 100 mL IVPB  Status:  Discontinued        2 g 200 mL/hr over 30 Minutes Intravenous Every 12 hours 08/11/21 1829 08/12/21 0517        Subjective: Does not report worsening pain in back.  Staff reports that p.o. intake has been poor.  Objective: Vitals:   08/24/21 0500 08/24/21 0515 08/24/21 0734 08/24/21 1518  BP: (!) 105/56  (!) 121/45 (!) 112/55  Pulse: 90  83 91  Resp:   15 14  Temp: 98 F (36.7 C)  (!) 97.3 F (36.3 C) 97.6 F (36.4 C)  TempSrc: Oral     SpO2:  95% 99% 93%  Weight:      Height:        Intake/Output Summary (Last 24 hours) at 08/24/2021 1916 Last data filed at 08/24/2021 0545 Gross per 24 hour  Intake 700  ml  Output --  Net 700 ml   Filed Weights   08/08/21 0625 08/16/21 0754  Weight: 91 kg 91 kg    Examination:  General exam: Alert, awake, oriented x 3 Respiratory system: Clear to auscultation. Respiratory effort normal. Cardiovascular system:RRR. No murmurs, rubs, gallops. Gastrointestinal system: Abdomen is nondistended, soft and nontender. No organomegaly or masses felt. Normal bowel sounds heard. Central nervous system: Alert and oriented. No focal neurological deficits. Extremities: No C/C/E, +pedal pulses Skin: No rashes, lesions or ulcers Psychiatry: Flat affect, does not really participate in conversation   Data Reviewed: I have personally reviewed following labs and imaging studies  CBC: Recent Labs  Lab 08/18/21 0416 08/19/21 0433 08/21/21 0630 08/23/21 0606 08/24/21 0503  WBC 9.0 8.6 10.4 17.2* 12.5*  NEUTROABS  --   --   --  15.0*  --  HGB 8.0* 8.7* 8.3* 10.2* 9.7*  HCT 26.2* 27.3* 28.2* 33.3* 30.1*  MCV 94.6 98.6 97.9 98.2 98.4  PLT 215 233 192 240 756   Basic Metabolic Panel: Recent Labs  Lab 08/18/21 0416 08/19/21 0433 08/21/21 0630 08/23/21 0606 08/24/21 0503  NA 136 138 141 139 139  K 4.7 4.8 5.0 5.8* 4.8  CL 95* 96* 99 97* 95*  CO2 27 28 29 25 28   GLUCOSE 86 85 103* 57* 81  BUN 50* 65* 63* 63* 55*  CREATININE 8.54* 9.93* 9.88* 9.01* 7.13*  CALCIUM 11.1* 10.8* 10.6* 10.3 10.0  MG 2.1 2.3  --   --   --   PHOS  --   --  7.7*  --  8.0*   GFR: Estimated Creatinine Clearance: 12.9 mL/min (A) (by C-G formula based on SCr of 7.13 mg/dL (H)). Liver Function Tests: Recent Labs  Lab 08/24/21 0503  ALBUMIN 2.5*   No results for input(s): LIPASE, AMYLASE in the last 168 hours. No results for input(s): AMMONIA in the last 168 hours. Coagulation Profile: No results for input(s): INR, PROTIME in the last 168 hours. Cardiac Enzymes: No results for input(s): CKTOTAL, CKMB, CKMBINDEX, TROPONINI in the last 168 hours. BNP (last 3 results) No  results for input(s): PROBNP in the last 8760 hours. HbA1C: No results for input(s): HGBA1C in the last 72 hours. CBG: No results for input(s): GLUCAP in the last 168 hours.  Lipid Profile: No results for input(s): CHOL, HDL, LDLCALC, TRIG, CHOLHDL, LDLDIRECT in the last 72 hours. Thyroid Function Tests: No results for input(s): TSH, T4TOTAL, FREET4, T3FREE, THYROIDAB in the last 72 hours. Anemia Panel: No results for input(s): VITAMINB12, FOLATE, FERRITIN, TIBC, IRON, RETICCTPCT in the last 72 hours. Sepsis Labs: No results for input(s): PROCALCITON, LATICACIDVEN in the last 168 hours.   Recent Results (from the past 240 hour(s))  Resp Panel by RT-PCR (Flu A&B, Covid) Nasopharyngeal Swab     Status: None   Collection Time: 08/21/21  9:50 AM   Specimen: Nasopharyngeal Swab; Nasopharyngeal(NP) swabs in vial transport medium  Result Value Ref Range Status   SARS Coronavirus 2 by RT PCR NEGATIVE NEGATIVE Final    Comment: (NOTE) SARS-CoV-2 target nucleic acids are NOT DETECTED.  The SARS-CoV-2 RNA is generally detectable in upper respiratory specimens during the acute phase of infection. The lowest concentration of SARS-CoV-2 viral copies this assay can detect is 138 copies/mL. A negative result does not preclude SARS-Cov-2 infection and should not be used as the sole basis for treatment or other patient management decisions. A negative result may occur with  improper specimen collection/handling, submission of specimen other than nasopharyngeal swab, presence of viral mutation(s) within the areas targeted by this assay, and inadequate number of viral copies(<138 copies/mL). A negative result must be combined with clinical observations, patient history, and epidemiological information. The expected result is Negative.  Fact Sheet for Patients:  EntrepreneurPulse.com.au  Fact Sheet for Healthcare Providers:  IncredibleEmployment.be  This test  is no t yet approved or cleared by the [place] FDA and  has been authorized for detection and/or diagnosis of SARS-CoV-2 by FDA under an Emergency Use Authorization (EUA). This EUA will remain  in effect (meaning this test can be used) for the duration of the COVID-19 declaration under Section 564(b)(1) of the Act, 21 U.S.C.section 360bbb-3(b)(1), unless the authorization is terminated  or revoked sooner.       Influenza A by PCR NEGATIVE NEGATIVE Final   Influenza B by  PCR NEGATIVE NEGATIVE Final    Comment: (NOTE) The Xpert Xpress SARS-CoV-2/FLU/RSV plus assay is intended as an aid in the diagnosis of influenza from Nasopharyngeal swab specimens and should not be used as a sole basis for treatment. Nasal washings and aspirates are unacceptable for Xpert Xpress SARS-CoV-2/FLU/RSV testing.  Fact Sheet for Patients: EntrepreneurPulse.com.au  Fact Sheet for Healthcare Providers: IncredibleEmployment.be  This test is not yet approved or cleared by the [place] FDA and has been authorized for detection and/or diagnosis of SARS-CoV-2 by FDA under an Emergency Use Authorization (EUA). This EUA will remain in effect (meaning this test can be used) for the duration of the COVID-19 declaration under Section 564(b)(1) of the Act, 21 U.S.C. section 360bbb-3(b)(1), unless the authorization is terminated or revoked.  Performed at [facility], [address] [place], [place] [address]           Radiology Studies: No results found.      Scheduled Meds:  apixaban  2.5 mg Oral BID   Chlorhexidine Gluconate Cloth  6 each Topical Q0600   epoetin (EPOGEN/PROCRIT) injection  4,000 Units Intravenous Q M,W,F-HD   feeding supplement (NEPRO CARB STEADY)  237 mL Oral TID BM   lidocaine  2 patch Transdermal Q24H   mirtazapine  15 mg Oral QHS   nicotine  21 mg Transdermal Daily   senna-docusate  1 tablet Oral BID   sevelamer  carbonate  1,600 mg Oral TID WC   Continuous Infusions:  sodium chloride Stopped (08/21/21 0557)   ampicillin (OMNIPEN) IV Stopped (08/24/21 0539)   cefTRIAXone (ROCEPHIN)  IV 2 g (08/24/21 1051)          Kathie Dike, MD [facility] 08/24/2021, 7:16 PM

## 2021-08-25 ENCOUNTER — Inpatient Hospital Stay: Payer: Medicare Other

## 2021-08-25 DIAGNOSIS — M5440 Lumbago with sciatica, unspecified side: Secondary | ICD-10-CM | POA: Diagnosis not present

## 2021-08-25 DIAGNOSIS — N186 End stage renal disease: Secondary | ICD-10-CM | POA: Diagnosis not present

## 2021-08-25 DIAGNOSIS — D631 Anemia in chronic kidney disease: Secondary | ICD-10-CM | POA: Diagnosis not present

## 2021-08-25 DIAGNOSIS — R7881 Bacteremia: Secondary | ICD-10-CM | POA: Diagnosis not present

## 2021-08-25 LAB — BASIC METABOLIC PANEL
Anion gap: 11 (ref 5–15)
BUN: 73 mg/dL — ABNORMAL HIGH (ref 6–20)
CO2: 28 mmol/L (ref 22–32)
Calcium: 9.8 mg/dL (ref 8.9–10.3)
Chloride: 98 mmol/L (ref 98–111)
Creatinine, Ser: 8.44 mg/dL — ABNORMAL HIGH (ref 0.61–1.24)
GFR, Estimated: 7 mL/min — ABNORMAL LOW (ref >60)
Glucose, Bld: 85 mg/dL (ref 70–99)
Potassium: 4.8 mmol/L (ref 3.5–5.1)
Sodium: 137 mmol/L (ref 135–145)

## 2021-08-25 LAB — CBC
HCT: 28.8 % — ABNORMAL LOW (ref 39.0–52.0)
Hemoglobin: 9.3 g/dL — ABNORMAL LOW (ref 13.0–17.0)
MCH: 31.2 pg (ref 26.0–34.0)
MCHC: 32.3 g/dL (ref 30.0–36.0)
MCV: 96.6 fL (ref 80.0–100.0)
Platelets: 234 10*3/uL (ref 150–400)
RBC: 2.98 MIL/uL — ABNORMAL LOW (ref 4.22–5.81)
RDW: 20 % — ABNORMAL HIGH (ref 11.5–15.5)
WBC: 11.5 10*3/uL — ABNORMAL HIGH (ref 4.0–10.5)
nRBC: 4.1 % — ABNORMAL HIGH (ref 0.0–0.2)

## 2021-08-25 LAB — BLOOD GAS, ARTERIAL
Acid-Base Excess: 3.3 mmol/L — ABNORMAL HIGH (ref 0.0–2.0)
Bicarbonate: 29.4 mmol/L — ABNORMAL HIGH (ref 20.0–28.0)
FIO2: 0.32
O2 Saturation: 98.8 %
Patient temperature: 37
pCO2 arterial: 52 mmHg — ABNORMAL HIGH (ref 32.0–48.0)
pH, Arterial: 7.36 (ref 7.350–7.450)
pO2, Arterial: 129 mmHg — ABNORMAL HIGH (ref 83.0–108.0)

## 2021-08-25 LAB — AMMONIA: Ammonia: 14 umol/L (ref 9–35)

## 2021-08-25 NOTE — Progress Notes (Signed)
[facility]  ROUNDING NOTE   Subjective:   Seen and examined on hemodialysis treatment.  Confused this morning.     HEMODIALYSIS FLOWSHEET:  Blood Flow Rate (mL/min): 400 mL/min Arterial Pressure (mmHg): -110 mmHg Venous Pressure (mmHg): 160 mmHg Transmembrane Pressure (mmHg): 80 mmHg Ultrafiltration Rate (mL/min): 670 mL/min Dialysate Flow Rate (mL/min): 500 ml/min Conductivity: Machine : 13.8 Conductivity: Machine : 13.8 Dialysis Fluid Bolus: Normal Saline Bolus Amount (mL): 250 mL    Objective:  Vital signs in last 24 hours:  Temp:  [97.6 F (36.4 C)-98.5 F (36.9 C)] 97.9 F (36.6 C) (09/16 0817) Pulse Rate:  [77-91] 81 (09/16 0945) Resp:  [12-20] 13 (09/16 0945) BP: (107-164)/(45-63) 119/57 (09/16 0945) SpO2:  [90 %-100 %] 100 % (09/16 0945)  Weight change:  Filed Weights   08/08/21 0625 08/16/21 0754  Weight: 91 kg 91 kg    Intake/Output: I/O last 3 completed shifts: In: 700 [IV Piggyback:700] Out: -    Intake/Output this shift:  No intake/output data recorded.  Physical Exam: General: NAD,  laying in bed  Head: Normocephalic, atraumatic. Moist oral mucosal membranes  Eyes: Anicteric  Lungs:  Clear to auscultation, normal effort  Heart: Regular rate and rhythm  Abdomen:  Soft, nontender, distended  Extremities: No edema  Neurologic: Alert, moving all four extremities  Skin: No lesions  Access: Rt AVG    Basic Metabolic Panel: Recent Labs  Lab 08/19/21 0433 08/21/21 0630 08/23/21 0606 08/24/21 0503 08/25/21 0605  NA 138 141 139 139 137  K 4.8 5.0 5.8* 4.8 4.8  CL 96* 99 97* 95* 98  CO2 28 29 25 28 28   GLUCOSE 85 103* 57* 81 85  BUN 65* 63* 63* 55* 73*  CREATININE 9.93* 9.88* 9.01* 7.13* 8.44*  CALCIUM 10.8* 10.6* 10.3 10.0 9.8  MG 2.3  --   --   --   --   PHOS  --  7.7*  --  8.0*  --      Liver Function Tests: Recent Labs  Lab 08/24/21 0503  ALBUMIN 2.5*     No results for input(s): LIPASE, AMYLASE in the  last 168 hours. No results for input(s): AMMONIA in the last 168 hours.  CBC: Recent Labs  Lab 08/19/21 0433 08/21/21 0630 08/23/21 0606 08/24/21 0503 08/25/21 0605  WBC 8.6 10.4 17.2* 12.5* 11.5*  NEUTROABS  --   --  15.0*  --   --   HGB 8.7* 8.3* 10.2* 9.7* 9.3*  HCT 27.3* 28.2* 33.3* 30.1* 28.8*  MCV 98.6 97.9 98.2 98.4 96.6  PLT 233 192 240 253 234     Cardiac Enzymes: No results for input(s): CKTOTAL, CKMB, CKMBINDEX, TROPONINI in the last 168 hours.  BNP: Invalid input(s): POCBNP  CBG: No results for input(s): GLUCAP in the last 168 hours.   Microbiology: Results for orders placed or performed during the hospital encounter of 08/08/21  Resp Panel by RT-PCR (Flu A&B, Covid) Nasopharyngeal Swab     Status: None   Collection Time: 08/08/21  1:22 PM   Specimen: Nasopharyngeal Swab; Nasopharyngeal(NP) swabs in vial transport medium  Result Value Ref Range Status   SARS Coronavirus 2 by RT PCR NEGATIVE NEGATIVE Final    Comment: (NOTE) SARS-CoV-2 target nucleic acids are NOT DETECTED.  The SARS-CoV-2 RNA is generally detectable in upper respiratory specimens during the acute phase of infection. The lowest concentration of SARS-CoV-2 viral copies this assay can detect is 138 copies/mL. A negative result does not preclude SARS-Cov-2  infection and should not be used as the sole basis for treatment or other patient management decisions. A negative result may occur with  improper specimen collection/handling, submission of specimen other than nasopharyngeal swab, presence of viral mutation(s) within the areas targeted by this assay, and inadequate number of viral copies(<138 copies/mL). A negative result must be combined with clinical observations, patient history, and epidemiological information. The expected result is Negative.  Fact Sheet for Patients:  EntrepreneurPulse.com.au  Fact Sheet for Healthcare Providers:   IncredibleEmployment.be  This test is no t yet approved or cleared by the [place] FDA and  has been authorized for detection and/or diagnosis of SARS-CoV-2 by FDA under an Emergency Use Authorization (EUA). This EUA will remain  in effect (meaning this test can be used) for the duration of the COVID-19 declaration under Section 564(b)(1) of the Act, 21 U.S.C.section 360bbb-3(b)(1), unless the authorization is terminated  or revoked sooner.       Influenza A by PCR NEGATIVE NEGATIVE Final   Influenza B by PCR NEGATIVE NEGATIVE Final    Comment: (NOTE) The Xpert Xpress SARS-CoV-2/FLU/RSV plus assay is intended as an aid in the diagnosis of influenza from Nasopharyngeal swab specimens and should not be used as a sole basis for treatment. Nasal washings and aspirates are unacceptable for Xpert Xpress SARS-CoV-2/FLU/RSV testing.  Fact Sheet for Patients: EntrepreneurPulse.com.au  Fact Sheet for Healthcare Providers: IncredibleEmployment.be  This test is not yet approved or cleared by the [place] FDA and has been authorized for detection and/or diagnosis of SARS-CoV-2 by FDA under an Emergency Use Authorization (EUA). This EUA will remain in effect (meaning this test can be used) for the duration of the COVID-19 declaration under Section 564(b)(1) of the Act, 21 U.S.C. section 360bbb-3(b)(1), unless the authorization is terminated or revoked.  Performed at [facility], [address] [place], [place] [address]   Culture, blood (Routine X 2) w Reflex to ID Panel     Status: Abnormal   Collection Time: 08/11/21  5:47 PM   Specimen: BLOOD  Result Value Ref Range Status   Specimen Description   Final    BLOOD LEFT HAND Performed at [facility], [address] [place], [place] [address]    Special Requests   Final    BOTTLES DRAWN AEROBIC AND ANAEROBIC Blood Culture adequate  volume Performed at [facility], [address] [place], [place] [address]    Culture  Setup Time   Final    GRAM POSITIVE COCCI IN CHAINS IN BOTH AEROBIC AND ANAEROBIC BOTTLES Gram Stain Report Called to,Read Back By and Verified With: JASON ROBBINS 08/12/21 @ 0507 BY SB Performed at [facility], [address] [place], [place] [address]    Culture (A)  Final    ENTEROCOCCUS FAECALIS SUSCEPTIBILITIES PERFORMED ON PREVIOUS CULTURE WITHIN THE LAST 5 DAYS. Performed at [facility], [place] [address], [place], [place] [address]    Report Status 08/14/2021 FINAL  Final  Culture, blood (Routine X 2) w Reflex to ID Panel     Status: Abnormal   Collection Time: 08/11/21  5:47 PM   Specimen: BLOOD  Result Value Ref Range Status   Specimen Description   Final    BLOOD FOA Performed at [facility], [address] [place], [place] [address]    Special Requests   Final    BOTTLES DRAWN AEROBIC AND ANAEROBIC Blood Culture adequate volume Performed at [facility], [address] [place], [place] [address]    Culture  Setup Time   Final    GRAM POSITIVE COCCI IN CHAINS IN BOTH AEROBIC AND ANAEROBIC BOTTLES Gram Stain Report Called to,Read Back By and Verified With: JASON ROBBINS 08/12/21 @ 0507 BY SB Performed at [facility], [place] [address] [place], [place] [address]    Culture ENTEROCOCCUS FAECALIS (A)  Final   Report Status 08/14/2021 FINAL  Final   Organism ID, Bacteria ENTEROCOCCUS FAECALIS  Final      Susceptibility   Enterococcus faecalis - MIC*    AMPICILLIN <=2 SENSITIVE Sensitive     VANCOMYCIN 2 SENSITIVE Sensitive     GENTAMICIN SYNERGY SENSITIVE Sensitive     * ENTEROCOCCUS FAECALIS  Blood Culture ID Panel (Reflexed)     Status: Abnormal   Collection Time: 08/11/21  5:47 PM  Result Value Ref Range Status   Enterococcus faecalis DETECTED (A) NOT DETECTED Final    Comment: CRITICAL RESULT CALLED TO, READ BACK BY AND  VERIFIED WITH: JASON ROBBINS 08/12/21 @ 0507 BY SB    Enterococcus Faecium NOT DETECTED NOT DETECTED Final   Listeria monocytogenes NOT DETECTED NOT DETECTED Final   Staphylococcus species NOT DETECTED NOT DETECTED Final   Staphylococcus aureus (BCID) NOT DETECTED NOT DETECTED Final   Staphylococcus epidermidis NOT DETECTED NOT DETECTED Final   Staphylococcus lugdunensis NOT DETECTED NOT DETECTED Final   Streptococcus species NOT DETECTED NOT DETECTED Final   Streptococcus agalactiae NOT DETECTED NOT DETECTED Final   Streptococcus pneumoniae NOT DETECTED NOT DETECTED Final   Streptococcus pyogenes NOT DETECTED NOT DETECTED Final   A.calcoaceticus-baumannii NOT DETECTED NOT DETECTED Final   Bacteroides fragilis NOT DETECTED NOT DETECTED Final   Enterobacterales NOT DETECTED NOT DETECTED Final   Enterobacter cloacae complex NOT DETECTED NOT DETECTED Final   Escherichia coli NOT DETECTED NOT DETECTED Final   Klebsiella aerogenes NOT DETECTED NOT DETECTED Final   Klebsiella oxytoca NOT DETECTED NOT DETECTED Final   Klebsiella pneumoniae NOT DETECTED NOT DETECTED Final   Proteus species NOT DETECTED NOT DETECTED Final   Salmonella species NOT DETECTED NOT DETECTED Final   Serratia marcescens NOT DETECTED NOT DETECTED Final   Haemophilus influenzae NOT DETECTED NOT DETECTED Final   Neisseria meningitidis NOT DETECTED NOT DETECTED Final   Pseudomonas aeruginosa NOT DETECTED NOT DETECTED Final   Stenotrophomonas maltophilia NOT DETECTED NOT DETECTED Final   Candida albicans NOT DETECTED NOT DETECTED Final   Candida auris NOT DETECTED NOT DETECTED Final   Candida glabrata NOT DETECTED NOT DETECTED Final   Candida krusei NOT DETECTED NOT DETECTED Final   Candida parapsilosis NOT DETECTED NOT DETECTED Final   Candida tropicalis NOT DETECTED NOT DETECTED Final   Cryptococcus neoformans/gattii NOT DETECTED NOT DETECTED Final   Vancomycin resistance NOT DETECTED NOT DETECTED Final    Comment:  Performed at [facility], [address] [place], [place] [address]  CULTURE, BLOOD (ROUTINE X 2) w Reflex to ID Panel     Status: None   Collection Time: 08/12/21 11:58 PM   Specimen: BLOOD  Result Value Ref Range Status   Specimen Description BLOOD BLOOD LEFT HAND  Final   Special Requests   Final    BOTTLES DRAWN AEROBIC AND ANAEROBIC Blood Culture adequate volume   Culture   Final    NO GROWTH 5 DAYS Performed at [facility] [place], [address], [place],  [address]    Report Status 08/18/2021 FINAL  Final  CULTURE, BLOOD (ROUTINE X  2) w Reflex to ID Panel     Status: None   Collection Time: 08/12/21 11:58 PM   Specimen: BLOOD  Result Value Ref Range Status   Specimen Description BLOOD LEFT RADIAL  Final   Special Requests   Final    BOTTLES DRAWN AEROBIC AND ANAEROBIC Blood Culture results may not be optimal due to an inadequate volume of blood received in culture bottles   Culture   Final    NO GROWTH 5 DAYS Performed at [facility], [address] [place], [place] [address]    Report Status 08/18/2021 FINAL  Final  Resp Panel by RT-PCR (Flu A&B, Covid) Nasopharyngeal Swab     Status: None   Collection Time: 08/21/21  9:50 AM   Specimen: Nasopharyngeal Swab; Nasopharyngeal(NP) swabs in vial transport medium  Result Value Ref Range Status   SARS Coronavirus 2 by RT PCR NEGATIVE NEGATIVE Final    Comment: (NOTE) SARS-CoV-2 target nucleic acids are NOT DETECTED.  The SARS-CoV-2 RNA is generally detectable in upper respiratory specimens during the acute phase of infection. The lowest concentration of SARS-CoV-2 viral copies this assay can detect is 138 copies/mL. A negative result does not preclude SARS-Cov-2 infection and should not be used as the sole basis for treatment or other patient management decisions. A negative result may occur with  improper specimen collection/handling, submission of specimen other than nasopharyngeal swab,  presence of viral mutation(s) within the areas targeted by this assay, and inadequate number of viral copies(<138 copies/mL). A negative result must be combined with clinical observations, patient history, and epidemiological information. The expected result is Negative.  Fact Sheet for Patients:  EntrepreneurPulse.com.au  Fact Sheet for Healthcare Providers:  IncredibleEmployment.be  This test is no t yet approved or cleared by the [place] FDA and  has been authorized for detection and/or diagnosis of SARS-CoV-2 by FDA under an Emergency Use Authorization (EUA). This EUA will remain  in effect (meaning this test can be used) for the duration of the COVID-19 declaration under Section 564(b)(1) of the Act, 21 U.S.C.section 360bbb-3(b)(1), unless the authorization is terminated  or revoked sooner.       Influenza A by PCR NEGATIVE NEGATIVE Final   Influenza B by PCR NEGATIVE NEGATIVE Final    Comment: (NOTE) The Xpert Xpress SARS-CoV-2/FLU/RSV plus assay is intended as an aid in the diagnosis of influenza from Nasopharyngeal swab specimens and should not be used as a sole basis for treatment. Nasal washings and aspirates are unacceptable for Xpert Xpress SARS-CoV-2/FLU/RSV testing.  Fact Sheet for Patients: EntrepreneurPulse.com.au  Fact Sheet for Healthcare Providers: IncredibleEmployment.be  This test is not yet approved or cleared by the [place] FDA and has been authorized for detection and/or diagnosis of SARS-CoV-2 by FDA under an Emergency Use Authorization (EUA). This EUA will remain in effect (meaning this test can be used) for the duration of the COVID-19 declaration under Section 564(b)(1) of the Act, 21 U.S.C. section 360bbb-3(b)(1), unless the authorization is terminated or revoked.  Performed at [facility], [address] [place], [place] [address]      Coagulation Studies: No results for input(s): LABPROT, INR in the last 72 hours.  Urinalysis: No results for input(s): COLORURINE, LABSPEC, PHURINE, GLUCOSEU, HGBUR, BILIRUBINUR, KETONESUR, PROTEINUR, UROBILINOGEN, NITRITE, LEUKOCYTESUR in the last 72 hours.  Invalid input(s): APPERANCEUR    Imaging: No results found.   Medications:    sodium chloride Stopped (08/21/21 0557)   ampicillin (OMNIPEN) IV 2 g (08/24/21 2326)  cefTRIAXone (ROCEPHIN)  IV 2 g (08/24/21 2129)     Chlorhexidine Gluconate Cloth  6 each Topical Q0600   epoetin (EPOGEN/PROCRIT) injection  4,000 Units Intravenous Q M,W,F-HD   feeding supplement (NEPRO CARB STEADY)  237 mL Oral TID BM   lidocaine  2 patch Transdermal Q24H   mirtazapine  15 mg Oral QHS   nicotine  21 mg Transdermal Daily   senna-docusate  1 tablet Oral BID   sevelamer carbonate  1,600 mg Oral TID WC   sodium chloride, acetaminophen, bisacodyl, hydrALAZINE, LORazepam, methocarbamol, Muscle Rub, ondansetron (ZOFRAN) IV, oxyCODONE-acetaminophen, polyethylene glycol, sodium chloride flush  Assessment/ Plan:  [name] [name] is a 60 y.o. black male with end stage renal disease on hemodialysis, hypertension, congestive heart failure, substance abuse who is admitted to [facility] on 08/08/2021 for Lower back pain [M54.50] Acute midline low back pain without sciatica [M54.50] At risk for inadequate pain control [Z91.89] Back pain [M54.9] Found to have eneterococcus faecalis bacteremia and osteomyelitis of C5-C6.   [facility] Right AVG Hero 90.5kg  End Stage Renal Disease:  Seen and examined on hemodialysis treatment. Tolerated treatment well.   Hypotension: 97/44.  Holding home dose of amlodipine.   Anemia of chronic kidney disease: hemoglobin 9.3 - EPO with HD treatments  Secondary Hyperparathyroidism: with hypercalcemia and hyperphosphatemia. On Etelcalcetide as outpatient.  - Sevelamer with meals.   Osteomyelitis: C5-C6   and L4-L5  - ampicillin 2 g bid and ceftriaxone 2g BID for a total of 6 weeks. End Date is 09/23/2021.  - He will need PO amoxacillin afterwards.    LOS: [place] 9/16/202210:09 AM

## 2021-08-25 NOTE — Progress Notes (Signed)
UF turned off at this time, patient blood pressure/map decreased. Patient denies symptoms at this time. Will continue to monitor closely.

## 2021-08-25 NOTE — Progress Notes (Signed)
Tx initiated without complications, cannulated successfully with 15g needles. Uf goal set for 1.5 kg.

## 2021-08-25 NOTE — Progress Notes (Signed)
Patient ID: [name] [name], male   DOB: 24-Oct-1961, 60 y.o.   MRN: [id number]  PROGRESS NOTE    [name] [name]  KDX:[id number] DOB: 1961-05-17 DOA: 08/08/2021 PCP: Pcp, No   Brief Narrative:  60 y.o. male with medical history significant of ESRD-HD, hypertension, tobacco abuse, polysubstance abuse, diverticulitis, anemia, jugular vein thrombosis, GI bleeding, right pneumothorax presented with worsening lower back pain.  He was found to have sepsis secondary to Enterococcus faecalis bacteremia.  ID was consulted and he has been on ampicillin and ceftriaxone.  He was found to have possible developing osteomyelitis in C5-C6 with possible early left psoas abscess formation and possible early discitis in L4-L5.  Nephrology was also consulted for continuation of dialysis.  TEE showed aortic valve endocarditis.  PT recommended SNF placement.  He is currently medically stable for discharge.  He was supposed to be discharged to SNF on 08/21/2021 but discharge was held because he could not sit up for dialysis.  Assessment & Plan:   Sepsis: Possibly evolving on admission Enterococcus faecalis bacteremia Aortic valve endocarditis -ID following.  Currently on Rocephin and ampicillin. -TTE negative for vegetations. 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  ID is recommending 6 to weeks of ampicillin and Rocephin till 09/23/2021 followed by p.o. amoxicillin for a few weeks.  -Status post tunneled catheter placement on 08/18/2021 by IR -Blood cultures from 08/11/2021 grew E faecalis.  Blood cultures from 08/12/2021 have been negative so far. -Sepsis has resolved.  Currently hemodynamically stable with no temperature spikes.  Severe lower back pain Possible developing osteomyelitis in C5-C6 Left psoas muscle infection with superimposed 1.3 cm phlegmon/early abscess formation Possible early discitis in L4-L5 -Continue antibiotics as above.  Continue pain management and bowel regimen -Patient reports that his  pain is not any worse.  WBC count trending down and he has been afebrile.  We will hold off on further imaging for now  End-stage renal disease on hemodialysis -Nephrology following.  Dialysis as per nephrology schedule -Dialysis schedule changed from M WF to TTS -We will need to ensure that patient is able to set up for entire dialysis session prior to discharge  Rectal bleeding -Noted to have bright red blood with bowel movement this morning.  No further blood with further bowel movements through the day -Suspect this may be related to hemorrhoids -Continue to follow for any further bleeding -Hemoglobin has been stable -If he continues to have bleeding, may need to have GI involvement. -Eliquis has been put on hold  Acute encephalopathy -Staff concerned that patient may be more confused/lethargic than his baseline -Check ABG, ammonia and CT head  Anemia of chronic disease -From renal failure.  Hemoglobin stable.  Monitor intermittently.  Polysubstance abuse and tobacco abuse -Patient was counseled regarding cessation by prior hospitalist.  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 Keturah Barre recommendations.   -Currently Eliquis on hold due to concerns for bleeding  Hypertension Hypotension -Blood pressure currently stable.  Off amlodipine  Chronic combined CHF -LVEF of 30 to 35% as per TTE on 08/13/2021.  Volume managed by dialysis.  Strict input output.  Daily weights.  Fluid restriction  Depression -Patient's mother reports that patient has been depressed for the past 2 years since the loss of his child -His p.o. intake has been poor, he does not engage with staff -He denies suicidal ideations -We will start on  Remeron -Will need outpatient psychiatry follow-up  Generalized conditioning -PT/OT recommend SNF placement.  Social worker following.  DVT  prophylaxis: Eliquis Code Status: Full Family Communication: Updated patient's mother and father over the phone Disposition Plan: Status is: Inpatient  Remains inpatient appropriate because:Inpatient level of care appropriate due to severity of illness  Dispo: The patient is from: Home              Anticipated d/c is to: SNF              Patient currently is medically stable to d/c.    Difficult to place patient No  Consultants: ID/cardiology/nephrology  Procedures: TTE/TEE Tunneled catheter placement by IR on 08/18/2021  Antimicrobials:  Anti-infectives (From admission, onward)    Start     Dose/Rate Route Frequency Ordered Stop   08/21/21 0000  ampicillin IVPB        2 g Intravenous Every 12 hours 08/21/21 0940 09/27/21 2359   08/21/21 0000  cefTRIAXone (ROCEPHIN) IVPB        2 g Intravenous Every 12 hours 08/21/21 0940 09/27/21 2359   08/14/21 1530  cefTRIAXone (ROCEPHIN) 2 g in sodium chloride 0.9 % 100 mL IVPB        2 g 200 mL/hr over 30 Minutes Intravenous Every 12 hours 08/14/21 1443     08/14/21 1200  vancomycin (VANCOCIN) IVPB 1000 mg/200 mL premix  Status:  Discontinued        1,000 mg 200 mL/hr over 60 Minutes Intravenous Every M-W-F (Hemodialysis) 08/11/21 1831 08/12/21 0517   08/12/21 2200  cefTRIAXone (ROCEPHIN) 2 g in sodium chloride 0.9 % 100 mL IVPB  Status:  Discontinued        2 g 200 mL/hr over 30 Minutes Intravenous Every 12 hours 08/12/21 1923 08/14/21 1443   08/12/21 0615  ampicillin (OMNIPEN) 2 g in sodium chloride 0.9 % 100 mL IVPB        2 g 300 mL/hr over 20 Minutes Intravenous Every 12 hours 08/12/21 0520     08/11/21 1930  vancomycin (VANCOREADY) IVPB 2000 mg/400 mL        2,000 mg 200 mL/hr over 120 Minutes Intravenous  Once 08/11/21 1831 08/12/21 0156   08/11/21 1900  cefTRIAXone (ROCEPHIN) 2 g in sodium chloride 0.9 % 100 mL IVPB  Status:  Discontinued        2 g 200 mL/hr over 30 Minutes Intravenous Every 12 hours 08/11/21 1829 08/12/21 0517         Subjective: Patient had dialysis today.  Does not report any worsening pain in his neck or back.  Staff had noted some blood on his linens while changing him this morning.  He noted some blood with his bowel movement this morning.  Subsequently he has had further bowel movements that did not contain any further blood.  Objective: Vitals:   08/25/21 1100 08/25/21 1115 08/25/21 1130 08/25/21 1700  BP: (!) 111/53 (!) 121/50 (!) 118/52 136/63  Pulse: 76 77 72 78  Resp: 13 12 12 17   Temp:   98.2 F (36.8 C) 97.7 F (36.5 C)  TempSrc:   Oral Oral  SpO2: 100% 100% 100% 92%  Weight:      Height:        Intake/Output Summary (Last 24 hours) at 08/25/2021 1943 Last data filed at 08/25/2021 1130 Gross per 24 hour  Intake --  Output 513 ml  Net -513 ml   Filed Weights   08/08/21 0625 08/16/21 0754  Weight: 91 kg 91 kg    Examination:  General exam: Alert, awake, oriented x 3 Respiratory system: Clear to auscultation. Respiratory effort normal. Cardiovascular system:RRR. No murmurs, rubs, gallops. Gastrointestinal system: Abdomen is nondistended, soft and nontender. No organomegaly or masses felt. Normal bowel sounds heard. Central nervous system: Alert and oriented. No focal neurological deficits. Extremities: No C/C/E, +pedal pulses Skin: No rashes, lesions or ulcers Psychiatry: He is noted to have some confusion    Data Reviewed: I have personally reviewed following labs and imaging studies  CBC: Recent Labs  Lab 08/19/21 0433 08/21/21 0630 08/23/21 0606 08/24/21 0503 08/25/21 0605  WBC 8.6 10.4 17.2* 12.5* 11.5*  NEUTROABS  --   --  15.0*  --   --   HGB 8.7* 8.3* 10.2* 9.7* 9.3*  HCT 27.3* 28.2* 33.3* 30.1* 28.8*  MCV 98.6 97.9 98.2 98.4 96.6  PLT 233 192 240 253 353   Basic Metabolic Panel: Recent Labs  Lab 08/19/21 0433 08/21/21 0630 08/23/21 0606 08/24/21 0503 08/25/21 0605  NA 138 141 139 139 137  K 4.8 5.0 5.8* 4.8 4.8  CL 96* 99 97* 95* 98   CO2 28 29 25 28 28   GLUCOSE 85 103* 57* 81 85  BUN 65* 63* 63* 55* 73*  CREATININE 9.93* 9.88* 9.01* 7.13* 8.44*  CALCIUM 10.8* 10.6* 10.3 10.0 9.8  MG 2.3  --   --   --   --   PHOS  --  7.7*  --  8.0*  --    GFR: Estimated Creatinine Clearance: 10.9 mL/min (A) (by C-G formula based on SCr of 8.44 mg/dL (H)). Liver Function Tests: Recent Labs  Lab 08/24/21 0503  ALBUMIN 2.5*   No results for input(s): LIPASE, AMYLASE in the last 168 hours. Recent Labs  Lab 08/25/21 1528  AMMONIA 14   Coagulation Profile: No results for input(s): INR, PROTIME in the last 168 hours. Cardiac Enzymes: No results for input(s): CKTOTAL, CKMB, CKMBINDEX, TROPONINI in the last 168 hours. BNP (last 3 results) No results for input(s): PROBNP in the last 8760 hours. HbA1C: No results for input(s): HGBA1C in the last 72 hours. CBG: No results for input(s): GLUCAP in the last 168 hours.  Lipid Profile: No results for input(s): CHOL, HDL, LDLCALC, TRIG, CHOLHDL, LDLDIRECT in the last 72 hours. Thyroid Function Tests: No results for input(s): TSH, T4TOTAL, FREET4, T3FREE, THYROIDAB in the last 72 hours. Anemia Panel: No results for input(s): VITAMINB12, FOLATE, FERRITIN, TIBC, IRON, RETICCTPCT in the last 72 hours. Sepsis Labs: No results for input(s): PROCALCITON, LATICACIDVEN in the last 168 hours.   Recent Results (from the past 240 hour(s))  Resp Panel by RT-PCR (Flu A&B, Covid) Nasopharyngeal Swab     Status: None   Collection Time: 08/21/21  9:50 AM   Specimen: Nasopharyngeal Swab; Nasopharyngeal(NP) swabs in vial transport medium  Result Value Ref Range Status   SARS Coronavirus 2 by RT PCR NEGATIVE NEGATIVE Final    Comment: (NOTE) SARS-CoV-2 target nucleic acids are NOT DETECTED.  The SARS-CoV-2 RNA is generally detectable in upper respiratory specimens during the acute phase of infection. The lowest concentration of SARS-CoV-2 viral copies this assay can detect is 138 copies/mL. A  negative result does not preclude SARS-Cov-2 infection and should not be used as the sole basis for treatment or other patient management decisions. A negative result may occur with  improper specimen collection/handling, submission of specimen other than nasopharyngeal swab, presence of viral mutation(s) within the areas targeted by  this assay, and inadequate number of viral copies(<138 copies/mL). A negative result must be combined with clinical observations, patient history, and epidemiological information. The expected result is Negative.  Fact Sheet for Patients:  EntrepreneurPulse.com.au  Fact Sheet for Healthcare Providers:  IncredibleEmployment.be  This test is no t yet approved or cleared by the [place] FDA and  has been authorized for detection and/or diagnosis of SARS-CoV-2 by FDA under an Emergency Use Authorization (EUA). This EUA will remain  in effect (meaning this test can be used) for the duration of the COVID-19 declaration under Section 564(b)(1) of the Act, 21 U.S.C.section 360bbb-3(b)(1), unless the authorization is terminated  or revoked sooner.       Influenza A by PCR NEGATIVE NEGATIVE Final   Influenza B by PCR NEGATIVE NEGATIVE Final    Comment: (NOTE) The Xpert Xpress SARS-CoV-2/FLU/RSV plus assay is intended as an aid in the diagnosis of influenza from Nasopharyngeal swab specimens and should not be used as a sole basis for treatment. Nasal washings and aspirates are unacceptable for Xpert Xpress SARS-CoV-2/FLU/RSV testing.  Fact Sheet for Patients: EntrepreneurPulse.com.au  Fact Sheet for Healthcare Providers: IncredibleEmployment.be  This test is not yet approved or cleared by the [place] FDA and has been authorized for detection and/or diagnosis of SARS-CoV-2 by FDA under an Emergency Use Authorization (EUA). This EUA will remain in effect (meaning this test can  be used) for the duration of the COVID-19 declaration under Section 564(b)(1) of the Act, 21 U.S.C. section 360bbb-3(b)(1), unless the authorization is terminated or revoked.  Performed at [facility], [address] [place], [place] [address]           Radiology Studies: CT HEAD WO CONTRAST (5MM)  Result Date: 08/25/2021 CLINICAL DATA:  Mental status change, unknown cause EXAM: CT HEAD WITHOUT CONTRAST TECHNIQUE: Contiguous axial images were obtained from the base of the skull through the vertex without intravenous contrast. COMPARISON:  None. FINDINGS: Brain: There is no acute intracranial hemorrhage, mass effect, or edema. Gray-white differentiation is preserved. There is no extra-axial fluid collection. Ventricles and sulci are within normal limits in size and configuration. Vascular: There is atherosclerotic calcification at the skull base. Skull: Calvarium is unremarkable. Sinuses/Orbits: No acute finding. Other: None. IMPRESSION: No acute intracranial abnormality. Electronically Signed   By: Macy Mis M.D.   On: 08/25/2021 18:02        Scheduled Meds:  Chlorhexidine Gluconate Cloth  6 each Topical Q0600   epoetin (EPOGEN/PROCRIT) injection  4,000 Units Intravenous Q M,W,F-HD   feeding supplement (NEPRO CARB STEADY)  237 mL Oral TID BM   lidocaine  2 patch Transdermal Q24H   nicotine  21 mg Transdermal Daily   senna-docusate  1 tablet Oral BID   sevelamer carbonate  1,600 mg Oral TID WC   Continuous Infusions:  sodium chloride Stopped (08/21/21 0557)   ampicillin (OMNIPEN) IV 2 g (08/25/21 1323)   cefTRIAXone (ROCEPHIN)  IV 2 g (08/25/21 1403)          Kathie Dike, MD [facility] 08/25/2021, 7:43 PM

## 2021-08-25 NOTE — Progress Notes (Signed)
PT Cancellation Note  Patient Details Name: [name] [name] MRN: [id number] DOB: 05-29-1961   Cancelled Treatment:     PT attempt. Pt is off floor for HD. We will continue to follow and progress as able per current POC.    Willette Pa 08/25/2021, 10:55 AM

## 2021-08-25 NOTE — Plan of Care (Signed)

## 2021-08-25 NOTE — TOC Progression Note (Signed)
Transition of Care [facility]) - Progression Note    Patient Details  Name: [name] [name] MRN: [id number] Date of Birth: [birth year]  Transition [facility]) CM/SW [place], RN Phone Number: 08/25/2021, 3:34 PM  Clinical Narrative:    Due to medical  condition change the patient is not expected to DC today to [facility], will continue to Monitor for status and ability to DC medically        Expected Discharge Plan and Services           Expected Discharge Date: 08/21/21                                     Social Determinants of Health (SDOH) Interventions    Readmission Risk Interventions No flowsheet data found.

## 2021-08-25 NOTE — Progress Notes (Signed)
Noted improvement with goal decrease, pt is resting comfortably will continue to monitor.

## 2021-08-25 NOTE — Progress Notes (Signed)
Patient blood pressure has decreased, reduced patient goal to 1kg will continue to monitor the patient closely.

## 2021-08-25 NOTE — Progress Notes (Signed)
Tx completed, patient uf goal was reduced then was turned off during treatment due to lower bp. Pt pulled off 0.5kg of fluid. Patient has slept the entirety of the treatment. Some extended bleeding when the arterial needle was pulled.

## 2021-08-25 NOTE — Progress Notes (Signed)
Bright red blood noted on bed pad. Patient had a small BM and there was blood noted from rectum.  MD notified.

## 2021-08-26 ENCOUNTER — Inpatient Hospital Stay: Payer: Medicare Other

## 2021-08-26 DIAGNOSIS — M5440 Lumbago with sciatica, unspecified side: Secondary | ICD-10-CM | POA: Diagnosis not present

## 2021-08-26 DIAGNOSIS — N186 End stage renal disease: Secondary | ICD-10-CM | POA: Diagnosis not present

## 2021-08-26 DIAGNOSIS — R7881 Bacteremia: Secondary | ICD-10-CM | POA: Diagnosis not present

## 2021-08-26 DIAGNOSIS — D631 Anemia in chronic kidney disease: Secondary | ICD-10-CM | POA: Diagnosis not present

## 2021-08-26 LAB — COMPREHENSIVE METABOLIC PANEL
ALT: 19 U/L (ref 0–44)
AST: 30 U/L (ref 15–41)
Albumin: 2.5 g/dL — ABNORMAL LOW (ref 3.5–5.0)
Alkaline Phosphatase: 72 U/L (ref 38–126)
Anion gap: 12 (ref 5–15)
BUN: 50 mg/dL — ABNORMAL HIGH (ref 6–20)
CO2: 29 mmol/L (ref 22–32)
Calcium: 9.7 mg/dL (ref 8.9–10.3)
Chloride: 97 mmol/L — ABNORMAL LOW (ref 98–111)
Creatinine, Ser: 6.84 mg/dL — ABNORMAL HIGH (ref 0.61–1.24)
GFR, Estimated: 9 mL/min — ABNORMAL LOW (ref >60)
Glucose, Bld: 120 mg/dL — ABNORMAL HIGH (ref 70–99)
Potassium: 3.8 mmol/L (ref 3.5–5.1)
Sodium: 138 mmol/L (ref 135–145)
Total Bilirubin: 0.8 mg/dL (ref 0.3–1.2)
Total Protein: 7.2 g/dL (ref 6.5–8.1)

## 2021-08-26 LAB — CBC
HCT: 30.1 % — ABNORMAL LOW (ref 39.0–52.0)
Hemoglobin: 9.4 g/dL — ABNORMAL LOW (ref 13.0–17.0)
MCH: 30.9 pg (ref 26.0–34.0)
MCHC: 31.2 g/dL (ref 30.0–36.0)
MCV: 99 fL (ref 80.0–100.0)
Platelets: 190 10*3/uL (ref 150–400)
RBC: 3.04 MIL/uL — ABNORMAL LOW (ref 4.22–5.81)
RDW: 21.2 % — ABNORMAL HIGH (ref 11.5–15.5)
WBC: 9.7 10*3/uL (ref 4.0–10.5)
nRBC: 1.7 % — ABNORMAL HIGH (ref 0.0–0.2)

## 2021-08-26 MED ORDER — APIXABAN 2.5 MG PO TABS
2.5000 mg | ORAL_TABLET | Freq: Two times a day (BID) | ORAL | Status: DC
  Administered 2021-08-27 – 2021-08-29 (×4): 2.5 mg via ORAL
  Filled 2021-08-26 (×6): qty 1

## 2021-08-26 MED ORDER — LORAZEPAM 2 MG/ML IJ SOLN
1.0000 mg | Freq: Four times a day (QID) | INTRAMUSCULAR | Status: DC | PRN
  Administered 2021-08-26 – 2021-08-30 (×5): 1 mg via INTRAVENOUS
  Filled 2021-08-26 (×5): qty 1

## 2021-08-26 NOTE — Progress Notes (Signed)
Patient ID: [name] [name], male   DOB: 1961/11/21, 60 y.o.   MRN: [id number]  PROGRESS NOTE    TREVONTE ASHKAR  JOI:[id number] DOB: 03/13/1961 DOA: 08/08/2021 PCP: Pcp, No   Brief Narrative:  60 y.o. male with medical history significant of ESRD-HD, hypertension, tobacco abuse, polysubstance abuse, diverticulitis, anemia, jugular vein thrombosis, GI bleeding, right pneumothorax presented with worsening lower back pain.  He was found to have sepsis secondary to Enterococcus faecalis bacteremia.  ID was consulted and he has been on ampicillin and ceftriaxone.  He was found to have possible developing osteomyelitis in C5-C6 with possible early left psoas abscess formation and possible early discitis in L4-L5.  Nephrology was also consulted for continuation of dialysis.  TEE showed aortic valve endocarditis.  PT recommended SNF placement.  He is currently medically stable for discharge.  He was supposed to be discharged to SNF on 08/21/2021 but discharge was held because he could not sit up for dialysis.  Assessment & Plan:   Sepsis: Possibly evolving on admission Enterococcus faecalis bacteremia Aortic valve endocarditis -ID following.  Currently on Rocephin and ampicillin. -TTE negative for vegetations. 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  ID is recommending 6 to weeks of ampicillin and Rocephin till 09/23/2021 followed by p.o. amoxicillin for a few weeks.  -Status post tunneled catheter placement on 08/18/2021 by IR -Blood cultures from 08/11/2021 grew E faecalis.  Blood cultures from 08/12/2021 have been negative so far. -Sepsis has resolved.  Currently hemodynamically stable with no temperature spikes.  Severe lower back pain Possible developing osteomyelitis in C5-C6 Left psoas muscle infection with superimposed 1.3 cm phlegmon/early abscess formation Possible early discitis in L4-L5 -Continue antibiotics as above.  Continue pain management and bowel regimen -Patient reports that his  pain is not any worse.  WBC count trending down and he has been afebrile.   End-stage renal disease on hemodialysis -Nephrology following.  Dialysis as per nephrology schedule -Dialysis schedule changed from M WF to TTS -We will need to ensure that patient is able to set up for entire dialysis session prior to discharge  Rectal bleeding -Noted to have bright red blood with bowel movement t on 9/16 no further blood with further bowel movements since then -Suspect this may be related to hemorrhoids -Hemoglobin has been stable -If he continues to have bleeding, may need to have GI involvement. -Eliquis was briefly put on hold, since has not had any further bleeding and hemoglobin has been stable, will resume.  Acute encephalopathy -Noted to be more confused and agitated over the past 48 hours -CT scan yesterday was unremarkable, no clear metabolic cause -MRI brain today confirms acute infarct -EEG is also been ordered -We will use Ativan for agitation  Acute CVA -MRI notes probable punctate acute infarcts in the posterior left temporal lobe and right insular cortex -Neurology consulted  Anemia of chronic disease -From renal failure.  Hemoglobin stable.  Monitor intermittently.  Polysubstance abuse and tobacco abuse -Patient was counseled regarding cessation by prior hospitalist.  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 Keturah Barre recommendations.    Hypertension Hypotension -Blood pressure currently stable.  Off amlodipine  Chronic combined CHF -LVEF of 30 to 35% as per TTE on 08/13/2021.  Volume managed by dialysis.  Strict input output.  Daily weights.  Fluid restriction  Depression -Patient's mother reports that patient has been depressed for  the past 2 years since the loss of his child -His p.o. intake has been poor, he does not engage with staff -He denies  suicidal ideations -Will need outpatient psychiatry follow-up  Generalized conditioning -PT/OT recommend SNF placement.  Social worker following.  DVT prophylaxis: Eliquis Code Status: Full Family Communication: Updated patient's cousin at the bedside Disposition Plan: Status is: Inpatient  Remains inpatient appropriate because:Inpatient level of care appropriate due to severity of illness  Dispo: The patient is from: Home              Anticipated d/c is to: SNF              Patient currently is medically stable to d/c.    Difficult to place patient No  Consultants: ID/cardiology/nephrology/neurology  Procedures: TTE/TEE Tunneled catheter placement by IR on 08/18/2021  Antimicrobials:  Anti-infectives (From admission, onward)    Start     Dose/Rate Route Frequency Ordered Stop   08/21/21 0000  ampicillin IVPB        2 g Intravenous Every 12 hours 08/21/21 0940 09/27/21 2359   08/21/21 0000  cefTRIAXone (ROCEPHIN) IVPB        2 g Intravenous Every 12 hours 08/21/21 0940 09/27/21 2359   08/14/21 1530  cefTRIAXone (ROCEPHIN) 2 g in sodium chloride 0.9 % 100 mL IVPB        2 g 200 mL/hr over 30 Minutes Intravenous Every 12 hours 08/14/21 1443     08/14/21 1200  vancomycin (VANCOCIN) IVPB 1000 mg/200 mL premix  Status:  Discontinued        1,000 mg 200 mL/hr over 60 Minutes Intravenous Every M-W-F (Hemodialysis) 08/11/21 1831 08/12/21 0517   08/12/21 2200  cefTRIAXone (ROCEPHIN) 2 g in sodium chloride 0.9 % 100 mL IVPB  Status:  Discontinued        2 g 200 mL/hr over 30 Minutes Intravenous Every 12 hours 08/12/21 1923 08/14/21 1443   08/12/21 0615  ampicillin (OMNIPEN) 2 g in sodium chloride 0.9 % 100 mL IVPB        2 g 300 mL/hr over 20 Minutes Intravenous Every 12 hours 08/12/21 0520     08/11/21 1930  vancomycin (VANCOREADY) IVPB 2000 mg/400 mL        2,000 mg 200 mL/hr over 120 Minutes Intravenous  Once 08/11/21 1831 08/12/21 0156   08/11/21 1900  cefTRIAXone (ROCEPHIN) 2  g in sodium chloride 0.9 % 100 mL IVPB  Status:  Discontinued        2 g 200 mL/hr over 30 Minutes Intravenous Every 12 hours 08/11/21 1829 08/12/21 0517        Subjective: Patient noted to be more confused today.  He is heard yelling from his room.  Does not seem to realize he is in the hospital.  Objective: Vitals:   08/26/21 0428 08/26/21 0750 08/26/21 1352 08/26/21 1554  BP: (!) 119/57 (!) 107/46 (!) 123/53 115/62  Pulse: 99 95 96 84  Resp: 18 17 17 18   Temp: 98 F (36.7 C) 98.8 F (37.1 C) (!) 97.4 F (36.3 C) 97.8 F (36.6 C)  TempSrc: Oral Oral Oral   SpO2: 100% 95% 94% 94%  Weight:      Height:        Intake/Output Summary (Last 24 hours) at 08/26/2021 1823 Last data filed at 08/25/2021 2237 Gross per 24 hour  Intake 240 ml  Output --  Net 240 ml   Filed Weights   08/08/21 0625 08/16/21 0754  Weight: 91 kg 91 kg    Examination:  General exam: Alert, awake, laying in bed, agitated Respiratory system: Clear to auscultation. Respiratory effort normal. Cardiovascular system:RRR. No murmurs, rubs, gallops. Gastrointestinal system: Abdomen is nondistended, soft and nontender. No organomegaly or masses felt. Normal bowel sounds heard. Central nervous system: No focal neurological deficits. Extremities: No C/C/E, +pedal pulses Skin: No rashes, lesions or ulcers Psychiatry: Confused and agitated     Data Reviewed: I have personally reviewed following labs and imaging studies  CBC: Recent Labs  Lab 08/21/21 0630 08/23/21 0606 08/24/21 0503 08/25/21 0605 08/26/21 0525  WBC 10.4 17.2* 12.5* 11.5* 9.7  NEUTROABS  --  15.0*  --   --   --   HGB 8.3* 10.2* 9.7* 9.3* 9.4*  HCT 28.2* 33.3* 30.1* 28.8* 30.1*  MCV 97.9 98.2 98.4 96.6 99.0  PLT 192 240 253 234 875   Basic Metabolic Panel: Recent Labs  Lab 08/21/21 0630 08/23/21 0606 08/24/21 0503 08/25/21 0605 08/26/21 0525  NA 141 139 139 137 138  K 5.0 5.8* 4.8 4.8 3.8  CL 99 97* 95* 98 97*  CO2 29  25 28 28 29   GLUCOSE 103* 57* 81 85 120*  BUN 63* 63* 55* 73* 50*  CREATININE 9.88* 9.01* 7.13* 8.44* 6.84*  CALCIUM 10.6* 10.3 10.0 9.8 9.7  PHOS 7.7*  --  8.0*  --   --    GFR: Estimated Creatinine Clearance: 13.5 mL/min (A) (by C-G formula based on SCr of 6.84 mg/dL (H)). Liver Function Tests: Recent Labs  Lab 08/24/21 0503 08/26/21 0525  AST  --  30  ALT  --  19  ALKPHOS  --  72  BILITOT  --  0.8  PROT  --  7.2  ALBUMIN 2.5* 2.5*   No results for input(s): LIPASE, AMYLASE in the last 168 hours. Recent Labs  Lab 08/25/21 1528  AMMONIA 14   Coagulation Profile: No results for input(s): INR, PROTIME in the last 168 hours. Cardiac Enzymes: No results for input(s): CKTOTAL, CKMB, CKMBINDEX, TROPONINI in the last 168 hours. BNP (last 3 results) No results for input(s): PROBNP in the last 8760 hours. HbA1C: No results for input(s): HGBA1C in the last 72 hours. CBG: No results for input(s): GLUCAP in the last 168 hours.  Lipid Profile: No results for input(s): CHOL, HDL, LDLCALC, TRIG, CHOLHDL, LDLDIRECT in the last 72 hours. Thyroid Function Tests: No results for input(s): TSH, T4TOTAL, FREET4, T3FREE, THYROIDAB in the last 72 hours. Anemia Panel: No results for input(s): VITAMINB12, FOLATE, FERRITIN, TIBC, IRON, RETICCTPCT in the last 72 hours. Sepsis Labs: No results for input(s): PROCALCITON, LATICACIDVEN in the last 168 hours.   Recent Results (from the past 240 hour(s))  Resp Panel by RT-PCR (Flu A&B, Covid) Nasopharyngeal Swab     Status: None   Collection Time: 08/21/21  9:50 AM   Specimen: Nasopharyngeal Swab; Nasopharyngeal(NP) swabs in vial transport medium  Result Value Ref Range Status   SARS Coronavirus 2 by RT PCR NEGATIVE NEGATIVE Final    Comment: (NOTE) SARS-CoV-2 target nucleic acids are NOT DETECTED.  The SARS-CoV-2 RNA is generally detectable in upper respiratory specimens during the acute phase of infection. The lowest concentration of  SARS-CoV-2 viral copies this assay can detect is 138 copies/mL. A negative result does not preclude SARS-Cov-2 infection and should not be used as the sole basis for treatment or other patient management decisions. A negative result may occur with  improper specimen collection/handling, submission of specimen  other than nasopharyngeal swab, presence of viral mutation(s) within the areas targeted by this assay, and inadequate number of viral copies(<138 copies/mL). A negative result must be combined with clinical observations, patient history, and epidemiological information. The expected result is Negative.  Fact Sheet for Patients:  EntrepreneurPulse.com.au  Fact Sheet for Healthcare Providers:  IncredibleEmployment.be  This test is no t yet approved or cleared by the [place] FDA and  has been authorized for detection and/or diagnosis of SARS-CoV-2 by FDA under an Emergency Use Authorization (EUA). This EUA will remain  in effect (meaning this test can be used) for the duration of the COVID-19 declaration under Section 564(b)(1) of the Act, 21 U.S.C.section 360bbb-3(b)(1), unless the authorization is terminated  or revoked sooner.       Influenza A by PCR NEGATIVE NEGATIVE Final   Influenza B by PCR NEGATIVE NEGATIVE Final    Comment: (NOTE) The Xpert Xpress SARS-CoV-2/FLU/RSV plus assay is intended as an aid in the diagnosis of influenza from Nasopharyngeal swab specimens and should not be used as a sole basis for treatment. Nasal washings and aspirates are unacceptable for Xpert Xpress SARS-CoV-2/FLU/RSV testing.  Fact Sheet for Patients: EntrepreneurPulse.com.au  Fact Sheet for Healthcare Providers: IncredibleEmployment.be  This test is not yet approved or cleared by the [place] FDA and has been authorized for detection and/or diagnosis of SARS-CoV-2 by FDA under an Emergency Use  Authorization (EUA). This EUA will remain in effect (meaning this test can be used) for the duration of the COVID-19 declaration under Section 564(b)(1) of the Act, 21 U.S.C. section 360bbb-3(b)(1), unless the authorization is terminated or revoked.  Performed at [facility], [address] [place], [place] [address]           Radiology Studies: CT HEAD WO CONTRAST (5MM)  Result Date: 08/25/2021 CLINICAL DATA:  Mental status change, unknown cause EXAM: CT HEAD WITHOUT CONTRAST TECHNIQUE: Contiguous axial images were obtained from the base of the skull through the vertex without intravenous contrast. COMPARISON:  None. FINDINGS: Brain: There is no acute intracranial hemorrhage, mass effect, or edema. Gray-white differentiation is preserved. There is no extra-axial fluid collection. Ventricles and sulci are within normal limits in size and configuration. Vascular: There is atherosclerotic calcification at the skull base. Skull: Calvarium is unremarkable. Sinuses/Orbits: No acute finding. Other: None. IMPRESSION: No acute intracranial abnormality. Electronically Signed   By: Macy Mis M.D.   On: 08/25/2021 18:02   MR BRAIN WO CONTRAST  Result Date: 08/26/2021 CLINICAL DATA:  Altered mental status, confusion EXAM: MRI HEAD WITHOUT CONTRAST TECHNIQUE: Multiplanar, multiecho pulse sequences of the brain and surrounding structures were obtained without intravenous contrast. COMPARISON:  Noncontrast CT head 1 day prior FINDINGS: Brain: There is a punctate focus of diffusion restriction in the left posterior temporal lobe cortex with appreciable low ADC signal. A similar more ill-defined focus of elevated DWI signal with apparent low ADC signal is seen in the right insular cortex. Additional ill-defined foci of elevated DWI signal in the bilateral corona radiata are seen without corresponding low ADC signal favored to reflect T2 shine through artifact. There is no evidence of large  vessel territorial infarct. There is no evidence of acute intracranial hemorrhage. The ventricles are not enlarged. Scattered foci of FLAIR signal abnormality in the subcortical and periventricular white matter are nonspecific but likely reflect sequela of mild chronic white matter microangiopathy. There is no mass lesion.  There is no midline shift. Vascular: Normal flow voids. Skull and upper cervical  spine: Marrow signal in the upper cervical spine is diffusely abnormal, as seen on prior thoracolumbar spine MRIs, nonspecific. Sinuses/Orbits: The paranasal sinuses are clear. The globes and orbits are unremarkable. Other: None. IMPRESSION: 1. Probable punctate acute infarcts in the posterior left temporal lobe and right insular cortex. Differential includes T2 shine through artifact. 2. Mild chronic white matter microangiopathy. Electronically Signed   By: Valetta Mole M.D.   On: 08/26/2021 12:34        Scheduled Meds:  apixaban  2.5 mg Oral BID   Chlorhexidine Gluconate Cloth  6 each Topical Q0600   epoetin (EPOGEN/PROCRIT) injection  4,000 Units Intravenous Q M,W,F-HD   feeding supplement (NEPRO CARB STEADY)  237 mL Oral TID BM   lidocaine  2 patch Transdermal Q24H   nicotine  21 mg Transdermal Daily   senna-docusate  1 tablet Oral BID   sevelamer carbonate  1,600 mg Oral TID WC   Continuous Infusions:  sodium chloride Stopped (08/21/21 0557)   ampicillin (OMNIPEN) IV 2 g (08/26/21 0831)   cefTRIAXone (ROCEPHIN)  IV 2 g (08/26/21 1029)          Kathie Dike, MD [facility] 08/26/2021, 6:23 PM

## 2021-08-26 NOTE — Plan of Care (Signed)

## 2021-08-26 NOTE — Progress Notes (Signed)
[facility]  ROUNDING NOTE   Subjective:  Patient resting in bed, in no acute distress.   Objective:  Vital signs in last 24 hours:  Temp:  [97.7 F (36.5 C)-98.9 F (37.2 C)] 98.8 F (37.1 C) (09/17 0750) Pulse Rate:  [72-103] 95 (09/17 0750) Resp:  [12-18] 17 (09/17 0750) BP: (87-145)/(44-69) 107/46 (09/17 0750) SpO2:  [92 %-100 %] 95 % (09/17 0750)  Weight change:  Filed Weights   08/08/21 0625 08/16/21 0754  Weight: 91 kg 91 kg    Intake/Output: I/O last 3 completed shifts: In: 240 [P.O.:240] Out: 513 [Other:513]   Intake/Output this shift:  No intake/output data recorded.  Physical Exam: General: Resting in bed, in no acute distress  Head: Moist oral mucosal membranes  Eyes: Anicteric  Lungs Respiration, even,symmetrical, lungs clear  Heart: S1S2,no rubs or gallops  Abdomen:  Soft, nontender, nondistended  Extremities: No peripheral edema  Neurologic: Awake, alert, oriented  Skin: No acute lesions or rashes  Access: Rt AVG    Basic Metabolic Panel: Recent Labs  Lab 08/21/21 0630 08/23/21 0606 08/24/21 0503 08/25/21 0605 08/26/21 0525  NA 141 139 139 137 138  K 5.0 5.8* 4.8 4.8 3.8  CL 99 97* 95* 98 97*  CO2 29 25 28 28 29   GLUCOSE 103* 57* 81 85 120*  BUN 63* 63* 55* 73* 50*  CREATININE 9.88* 9.01* 7.13* 8.44* 6.84*  CALCIUM 10.6* 10.3 10.0 9.8 9.7  PHOS 7.7*  --  8.0*  --   --      Liver Function Tests: Recent Labs  Lab 08/24/21 0503 08/26/21 0525  AST  --  30  ALT  --  19  ALKPHOS  --  72  BILITOT  --  0.8  PROT  --  7.2  ALBUMIN 2.5* 2.5*     No results for input(s): LIPASE, AMYLASE in the last 168 hours. Recent Labs  Lab 08/25/21 1528  AMMONIA 14    CBC: Recent Labs  Lab 08/21/21 0630 08/23/21 0606 08/24/21 0503 08/25/21 0605 08/26/21 0525  WBC 10.4 17.2* 12.5* 11.5* 9.7  NEUTROABS  --  15.0*  --   --   --   HGB 8.3* 10.2* 9.7* 9.3* 9.4*  HCT 28.2* 33.3* 30.1* 28.8* 30.1*  MCV 97.9 98.2 98.4 96.6  99.0  PLT 192 240 253 234 190     Cardiac Enzymes: No results for input(s): CKTOTAL, CKMB, CKMBINDEX, TROPONINI in the last 168 hours.  BNP: Invalid input(s): POCBNP  CBG: No results for input(s): GLUCAP in the last 168 hours.   Microbiology: Results for orders placed or performed during the hospital encounter of 08/08/21  Resp Panel by RT-PCR (Flu A&B, Covid) Nasopharyngeal Swab     Status: None   Collection Time: 08/08/21  1:22 PM   Specimen: Nasopharyngeal Swab; Nasopharyngeal(NP) swabs in vial transport medium  Result Value Ref Range Status   SARS Coronavirus 2 by RT PCR NEGATIVE NEGATIVE Final    Comment: (NOTE) SARS-CoV-2 target nucleic acids are NOT DETECTED.  The SARS-CoV-2 RNA is generally detectable in upper respiratory specimens during the acute phase of infection. The lowest concentration of SARS-CoV-2 viral copies this assay can detect is 138 copies/mL. A negative result does not preclude SARS-Cov-2 infection and should not be used as the sole basis for treatment or other patient management decisions. A negative result may occur with  improper specimen collection/handling, submission of specimen other than nasopharyngeal swab, presence of viral mutation(s) within the areas targeted by  this assay, and inadequate number of viral copies(<138 copies/mL). A negative result must be combined with clinical observations, patient history, and epidemiological information. The expected result is Negative.  Fact Sheet for Patients:  EntrepreneurPulse.com.au  Fact Sheet for Healthcare Providers:  IncredibleEmployment.be  This test is no t yet approved or cleared by the [place] FDA and  has been authorized for detection and/or diagnosis of SARS-CoV-2 by FDA under an Emergency Use Authorization (EUA). This EUA will remain  in effect (meaning this test can be used) for the duration of the COVID-19 declaration under Section 564(b)(1)  of the Act, 21 U.S.C.section 360bbb-3(b)(1), unless the authorization is terminated  or revoked sooner.       Influenza A by PCR NEGATIVE NEGATIVE Final   Influenza B by PCR NEGATIVE NEGATIVE Final    Comment: (NOTE) The Xpert Xpress SARS-CoV-2/FLU/RSV plus assay is intended as an aid in the diagnosis of influenza from Nasopharyngeal swab specimens and should not be used as a sole basis for treatment. Nasal washings and aspirates are unacceptable for Xpert Xpress SARS-CoV-2/FLU/RSV testing.  Fact Sheet for Patients: EntrepreneurPulse.com.au  Fact Sheet for Healthcare Providers: IncredibleEmployment.be  This test is not yet approved or cleared by the [place] FDA and has been authorized for detection and/or diagnosis of SARS-CoV-2 by FDA under an Emergency Use Authorization (EUA). This EUA will remain in effect (meaning this test can be used) for the duration of the COVID-19 declaration under Section 564(b)(1) of the Act, 21 U.S.C. section 360bbb-3(b)(1), unless the authorization is terminated or revoked.  Performed at [facility], [address] [place], [place] [address]   Culture, blood (Routine X 2) w Reflex to ID Panel     Status: Abnormal   Collection Time: 08/11/21  5:47 PM   Specimen: BLOOD  Result Value Ref Range Status   Specimen Description   Final    BLOOD LEFT HAND Performed at [facility], [address], [place], [place] [address]    Special Requests   Final    BOTTLES DRAWN AEROBIC AND ANAEROBIC Blood Culture adequate volume Performed at [facility], [address] [place], [place] [address]    Culture  Setup Time   Final    GRAM POSITIVE COCCI IN CHAINS IN BOTH AEROBIC AND ANAEROBIC BOTTLES Gram Stain Report Called to,Read Back By and Verified With: JASON ROBBINS 08/12/21 @ 0507 BY SB Performed at [facility], [address] [place], [place] [address]    Culture (A)   Final    ENTEROCOCCUS FAECALIS SUSCEPTIBILITIES PERFORMED ON PREVIOUS CULTURE WITHIN THE LAST 5 DAYS. Performed at [facility], [place] [address] [place], [place] [address]    Report Status 08/14/2021 FINAL  Final  Culture, blood (Routine X 2) w Reflex to ID Panel     Status: Abnormal   Collection Time: 08/11/21  5:47 PM   Specimen: BLOOD  Result Value Ref Range Status   Specimen Description   Final    BLOOD FOA Performed at [facility], [address], [place], [place] [address]    Special Requests   Final    BOTTLES DRAWN AEROBIC AND ANAEROBIC Blood Culture adequate volume Performed at [facility], [address] [place],  [address]    Culture  Setup Time   Final    GRAM POSITIVE COCCI IN CHAINS IN BOTH AEROBIC AND ANAEROBIC BOTTLES Gram Stain Report Called to,Read Back By and Verified With: JASON ROBBINS 08/12/21 @ 0507  BY SB Performed at [facility], [place] [address] [place], [place] [address]    Culture ENTEROCOCCUS FAECALIS (A)  Final   Report Status 08/14/2021 FINAL  Final   Organism ID, Bacteria ENTEROCOCCUS FAECALIS  Final      Susceptibility   Enterococcus faecalis - MIC*    AMPICILLIN <=2 SENSITIVE Sensitive     VANCOMYCIN 2 SENSITIVE Sensitive     GENTAMICIN SYNERGY SENSITIVE Sensitive     * ENTEROCOCCUS FAECALIS  Blood Culture ID Panel (Reflexed)     Status: Abnormal   Collection Time: 08/11/21  5:47 PM  Result Value Ref Range Status   Enterococcus faecalis DETECTED (A) NOT DETECTED Final    Comment: CRITICAL RESULT CALLED TO, READ BACK BY AND VERIFIED WITH: JASON ROBBINS 08/12/21 @ 0507 BY SB    Enterococcus Faecium NOT DETECTED NOT DETECTED Final   Listeria monocytogenes NOT DETECTED NOT DETECTED Final   Staphylococcus species NOT DETECTED NOT DETECTED Final   Staphylococcus aureus (BCID) NOT DETECTED NOT DETECTED Final   Staphylococcus epidermidis NOT DETECTED NOT DETECTED Final   Staphylococcus lugdunensis NOT DETECTED  NOT DETECTED Final   Streptococcus species NOT DETECTED NOT DETECTED Final   Streptococcus agalactiae NOT DETECTED NOT DETECTED Final   Streptococcus pneumoniae NOT DETECTED NOT DETECTED Final   Streptococcus pyogenes NOT DETECTED NOT DETECTED Final   A.calcoaceticus-baumannii NOT DETECTED NOT DETECTED Final   Bacteroides fragilis NOT DETECTED NOT DETECTED Final   Enterobacterales NOT DETECTED NOT DETECTED Final   Enterobacter cloacae complex NOT DETECTED NOT DETECTED Final   Escherichia coli NOT DETECTED NOT DETECTED Final   Klebsiella aerogenes NOT DETECTED NOT DETECTED Final   Klebsiella oxytoca NOT DETECTED NOT DETECTED Final   Klebsiella pneumoniae NOT DETECTED NOT DETECTED Final   Proteus species NOT DETECTED NOT DETECTED Final   Salmonella species NOT DETECTED NOT DETECTED Final   Serratia marcescens NOT DETECTED NOT DETECTED Final   Haemophilus influenzae NOT DETECTED NOT DETECTED Final   Neisseria meningitidis NOT DETECTED NOT DETECTED Final   Pseudomonas aeruginosa NOT DETECTED NOT DETECTED Final   Stenotrophomonas maltophilia NOT DETECTED NOT DETECTED Final   Candida albicans NOT DETECTED NOT DETECTED Final   Candida auris NOT DETECTED NOT DETECTED Final   Candida glabrata NOT DETECTED NOT DETECTED Final   Candida krusei NOT DETECTED NOT DETECTED Final   Candida parapsilosis NOT DETECTED NOT DETECTED Final   Candida tropicalis NOT DETECTED NOT DETECTED Final   Cryptococcus neoformans/gattii NOT DETECTED NOT DETECTED Final   Vancomycin resistance NOT DETECTED NOT DETECTED Final    Comment: Performed at [facility], [address] [place], [place] [address]  CULTURE, BLOOD (ROUTINE X 2) w Reflex to ID Panel     Status: None   Collection Time: 08/12/21 11:58 PM   Specimen: BLOOD  Result Value Ref Range Status   Specimen Description BLOOD BLOOD LEFT HAND  Final   Special Requests   Final    BOTTLES DRAWN AEROBIC AND ANAEROBIC Blood Culture adequate volume    Culture   Final    NO GROWTH 5 DAYS Performed at [facility], [address] [place], [place] [address]    Report Status 08/18/2021 FINAL  Final  CULTURE, BLOOD (ROUTINE X 2) w Reflex to ID Panel     Status: None   Collection Time: 08/12/21 11:58 PM   Specimen: BLOOD  Result Value Ref Range Status   Specimen Description BLOOD LEFT RADIAL  Final   Special Requests  Final    BOTTLES DRAWN AEROBIC AND ANAEROBIC Blood Culture results may not be optimal due to an inadequate volume of blood received in culture bottles   Culture   Final    NO GROWTH 5 DAYS Performed at [facility], [address] [place], [place] [address]    Report Status 08/18/2021 FINAL  Final  Resp Panel by RT-PCR (Flu A&B, Covid) Nasopharyngeal Swab     Status: None   Collection Time: 08/21/21  9:50 AM   Specimen: Nasopharyngeal Swab; Nasopharyngeal(NP) swabs in vial transport medium  Result Value Ref Range Status   SARS Coronavirus 2 by RT PCR NEGATIVE NEGATIVE Final    Comment: (NOTE) SARS-CoV-2 target nucleic acids are NOT DETECTED.  The SARS-CoV-2 RNA is generally detectable in upper respiratory specimens during the acute phase of infection. The lowest concentration of SARS-CoV-2 viral copies this assay can detect is 138 copies/mL. A negative result does not preclude SARS-Cov-2 infection and should not be used as the sole basis for treatment or other patient management decisions. A negative result may occur with  improper specimen collection/handling, submission of specimen other than nasopharyngeal swab, presence of viral mutation(s) within the areas targeted by this assay, and inadequate number of viral copies(<138 copies/mL). A negative result must be combined with clinical observations, patient history, and epidemiological information. The expected result is Negative.  Fact Sheet for Patients:  EntrepreneurPulse.com.au  Fact Sheet for Healthcare Providers:   IncredibleEmployment.be  This test is no t yet approved or cleared by the [place] FDA and  has been authorized for detection and/or diagnosis of SARS-CoV-2 by FDA under an Emergency Use Authorization (EUA). This EUA will remain  in effect (meaning this test can be used) for the duration of the COVID-19 declaration under Section 564(b)(1) of the Act, 21 U.S.C.section 360bbb-3(b)(1), unless the authorization is terminated  or revoked sooner.       Influenza A by PCR NEGATIVE NEGATIVE Final   Influenza B by PCR NEGATIVE NEGATIVE Final    Comment: (NOTE) The Xpert Xpress SARS-CoV-2/FLU/RSV plus assay is intended as an aid in the diagnosis of influenza from Nasopharyngeal swab specimens and should not be used as a sole basis for treatment. Nasal washings and aspirates are unacceptable for Xpert Xpress SARS-CoV-2/FLU/RSV testing.  Fact Sheet for Patients: EntrepreneurPulse.com.au  Fact Sheet for Healthcare Providers: IncredibleEmployment.be  This test is not yet approved or cleared by the [place] FDA and has been authorized for detection and/or diagnosis of SARS-CoV-2 by FDA under an Emergency Use Authorization (EUA). This EUA will remain in effect (meaning this test can be used) for the duration of the COVID-19 declaration under Section 564(b)(1) of the Act, 21 U.S.C. section 360bbb-3(b)(1), unless the authorization is terminated or revoked.  Performed at [facility], [address] [place], [place] [address]     Coagulation Studies: No results for input(s): LABPROT, INR in the last 72 hours.  Urinalysis: No results for input(s): COLORURINE, LABSPEC, PHURINE, GLUCOSEU, HGBUR, BILIRUBINUR, KETONESUR, PROTEINUR, UROBILINOGEN, NITRITE, LEUKOCYTESUR in the last 72 hours.  Invalid input(s): APPERANCEUR    Imaging: CT HEAD WO CONTRAST (5MM)  Result Date: 08/25/2021 CLINICAL DATA:  Mental status  change, unknown cause EXAM: CT HEAD WITHOUT CONTRAST TECHNIQUE: Contiguous axial images were obtained from the base of the skull through the vertex without intravenous contrast. COMPARISON:  None. FINDINGS: Brain: There is no acute intracranial hemorrhage, mass effect, or edema. Gray-white differentiation is preserved. There is no extra-axial fluid collection. Ventricles and  sulci are within normal limits in size and configuration. Vascular: There is atherosclerotic calcification at the skull base. Skull: Calvarium is unremarkable. Sinuses/Orbits: No acute finding. Other: None. IMPRESSION: No acute intracranial abnormality. Electronically Signed   By: Macy Mis M.D.   On: 08/25/2021 18:02     Medications:    sodium chloride Stopped (08/21/21 0557)   ampicillin (OMNIPEN) IV 2 g (08/26/21 0831)   cefTRIAXone (ROCEPHIN)  IV 2 g (08/25/21 2154)     Chlorhexidine Gluconate Cloth  6 each Topical Q0600   epoetin (EPOGEN/PROCRIT) injection  4,000 Units Intravenous Q M,W,F-HD   feeding supplement (NEPRO CARB STEADY)  237 mL Oral TID BM   lidocaine  2 patch Transdermal Q24H   nicotine  21 mg Transdermal Daily   senna-docusate  1 tablet Oral BID   sevelamer carbonate  1,600 mg Oral TID WC   sodium chloride, acetaminophen, bisacodyl, hydrALAZINE, LORazepam, methocarbamol, Muscle Rub, ondansetron (ZOFRAN) IV, oxyCODONE-acetaminophen, polyethylene glycol, sodium chloride flush  Assessment/ Plan:  [name] is a 60 y.o. black male with end stage renal disease on hemodialysis, hypertension, congestive heart failure, substance abuse who is admitted to [facility] on 08/08/2021 for Lower back pain [M54.50] Acute midline low back pain without sciatica [M54.50] At risk for inadequate pain control [Z91.89] Back pain [M54.9] Found to have eneterococcus faecalis bacteremia and osteomyelitis of C5-C6.   [facility] MWF [facility] Right AVG Hero 90.5kg  ESRD on HD MWF Received HD yesterday with UF of   about 500 ml  Hypotension Blood pressure readings still stay at low normal range 107/46 today.Antihypertensive medications on hold  Anemia of chronic kidney disease: hemoglobin 9.3 Patient is on Epogen 4000 units MWF with dialysis treatments Hgb 9.4 today  Secondary Hyperparathyroidism: with hypercalcemia and hyperphosphatemia. On Etelcalcetide as outpatient.  Currently on Sevelamer 1600 mg TID  Osteomyelitis: C5-C6  and L4-L5  Patient receiving antibiotic treatments per primary team.   LOS: 17 Hiilani Jetter 9/17/202210:07 AM

## 2021-08-26 NOTE — Plan of Care (Signed)
Patient is alert and oriented x 4. Patient has been repositioned frequently due to pain. Prn medication has been given as well. Vital signs are stable. Will continue to monitor.  Problem: Education: Goal: Knowledge of General Education information will improve Description: Including pain rating scale, medication(s)/side effects and non-pharmacologic comfort measures 08/26/2021 0645 by Kirtland Bouchard, RN Outcome: Progressing 08/26/2021 0644 by Kirtland Bouchard, RN Outcome: Progressing   Problem: Health Behavior/Discharge Planning: Goal: Ability to manage health-related needs will improve 08/26/2021 0645 by Kirtland Bouchard, RN Outcome: Progressing 08/26/2021 0644 by Kirtland Bouchard, RN Outcome: Progressing   Problem: Clinical Measurements: Goal: Ability to maintain clinical measurements within normal limits will improve 08/26/2021 0645 by Kirtland Bouchard, RN Outcome: Progressing 08/26/2021 0644 by Kirtland Bouchard, RN Outcome: Progressing Goal: Will remain free from infection 08/26/2021 0645 by Kirtland Bouchard, RN Outcome: Progressing 08/26/2021 0644 by Kirtland Bouchard, RN Outcome: Progressing Goal: Diagnostic test results will improve 08/26/2021 0645 by Kirtland Bouchard, RN Outcome: Progressing 08/26/2021 0644 by Kirtland Bouchard, RN Outcome: Progressing Goal: Respiratory complications will improve 08/26/2021 0645 by Kirtland Bouchard, RN Outcome: Progressing 08/26/2021 0644 by Kirtland Bouchard, RN Outcome: Progressing Goal: Cardiovascular complication will be avoided 08/26/2021 0645 by Kirtland Bouchard, RN Outcome: Progressing 08/26/2021 0644 by Kirtland Bouchard, RN Outcome: Progressing   Problem: Pain Managment: Goal: General experience of comfort will improve 08/26/2021 0645 by Kirtland Bouchard, RN Outcome: Progressing 08/26/2021 0644 by Kirtland Bouchard, RN Outcome: Progressing   Problem: Safety: Goal: Ability to remain free from injury will improve 08/26/2021 0645 by Kirtland Bouchard,  RN Outcome: Progressing 08/26/2021 0644 by Kirtland Bouchard, RN Outcome: Progressing

## 2021-08-27 DIAGNOSIS — N186 End stage renal disease: Secondary | ICD-10-CM | POA: Diagnosis not present

## 2021-08-27 DIAGNOSIS — I639 Cerebral infarction, unspecified: Secondary | ICD-10-CM | POA: Diagnosis not present

## 2021-08-27 DIAGNOSIS — G9341 Metabolic encephalopathy: Secondary | ICD-10-CM | POA: Diagnosis not present

## 2021-08-27 DIAGNOSIS — R7881 Bacteremia: Secondary | ICD-10-CM | POA: Diagnosis not present

## 2021-08-27 DIAGNOSIS — D631 Anemia in chronic kidney disease: Secondary | ICD-10-CM | POA: Diagnosis not present

## 2021-08-27 DIAGNOSIS — M5440 Lumbago with sciatica, unspecified side: Secondary | ICD-10-CM | POA: Diagnosis not present

## 2021-08-27 LAB — HEMOGLOBIN A1C
Hgb A1c MFr Bld: 4.3 % — ABNORMAL LOW (ref 4.8–5.6)
Mean Plasma Glucose: 76.71 mg/dL

## 2021-08-27 LAB — LDL CHOLESTEROL, DIRECT: Direct LDL: 75.1 mg/dL (ref 0–99)

## 2021-08-27 NOTE — Progress Notes (Signed)
Patient confused and agitated, Left chest wall PICC noted partially dislodged roughly 5-6 cm with rounding. IV team notified and informed that IR would have to assess and remove the central catheter. Oncall provider notified. Mitts placed on hands and temporary dressing placed over line.

## 2021-08-27 NOTE — Progress Notes (Addendum)
[facility]  ROUNDING NOTE   Subjective:  Patient appears very confused, drowsy but agitated today. He has his nasal cannula off of his nostrils, trying to bite when attempted to place it back. Nursing reports pulling IV access off , attempted Mitts, but not very successful.   Objective:  Vital signs in last 24 hours:  Temp:  [97.4 F (36.3 C)-98.4 F (36.9 C)] 97.5 F (36.4 C) (09/18 0816) Pulse Rate:  [84-97] 85 (09/18 0816) Resp:  [17-18] 17 (09/18 0816) BP: (115-140)/(53-67) 130/60 (09/18 0816) SpO2:  [94 %-100 %] 100 % (09/18 0816)  Weight change:  Filed Weights   08/08/21 0625 08/16/21 0754  Weight: 91 kg 91 kg    Intake/Output: I/O last 3 completed shifts: In: 240 [P.O.:240] Out: -    Intake/Output this shift:  No intake/output data recorded.  Physical Exam: General: Lying in bed, appears drowsy  Head: Normocephalic,atraumatic  Eyes: Anicteric  Lungs Lungs clear to auscultation bilaterally  Heart: Regular rate and rhythm  Abdomen:  Soft, nontender, nondistended  Extremities: No peripheral edema  Neurologic: Confused, agitated, drowsy, unable to answer questions   Skin: No acute lesions or rashes  Access: Rt AVG    Basic Metabolic Panel: Recent Labs  Lab 08/21/21 0630 08/23/21 0606 08/24/21 0503 08/25/21 0605 08/26/21 0525  NA 141 139 139 137 138  K 5.0 5.8* 4.8 4.8 3.8  CL 99 97* 95* 98 97*  CO2 29 25 28 28 29   GLUCOSE 103* 57* 81 85 120*  BUN 63* 63* 55* 73* 50*  CREATININE 9.88* 9.01* 7.13* 8.44* 6.84*  CALCIUM 10.6* 10.3 10.0 9.8 9.7  PHOS 7.7*  --  8.0*  --   --      Liver Function Tests: Recent Labs  Lab 08/24/21 0503 08/26/21 0525  AST  --  30  ALT  --  19  ALKPHOS  --  72  BILITOT  --  0.8  PROT  --  7.2  ALBUMIN 2.5* 2.5*     No results for input(s): LIPASE, AMYLASE in the last 168 hours. Recent Labs  Lab 08/25/21 1528  AMMONIA 14     CBC: Recent Labs  Lab 08/21/21 0630 08/23/21 0606 08/24/21 0503  08/25/21 0605 08/26/21 0525  WBC 10.4 17.2* 12.5* 11.5* 9.7  NEUTROABS  --  15.0*  --   --   --   HGB 8.3* 10.2* 9.7* 9.3* 9.4*  HCT 28.2* 33.3* 30.1* 28.8* 30.1*  MCV 97.9 98.2 98.4 96.6 99.0  PLT 192 240 253 234 190     Cardiac Enzymes: No results for input(s): CKTOTAL, CKMB, CKMBINDEX, TROPONINI in the last 168 hours.  BNP: Invalid input(s): POCBNP  CBG: No results for input(s): GLUCAP in the last 168 hours.   Microbiology: Results for orders placed or performed during the hospital encounter of 08/08/21  Resp Panel by RT-PCR (Flu A&B, Covid) Nasopharyngeal Swab     Status: None   Collection Time: 08/08/21  1:22 PM   Specimen: Nasopharyngeal Swab; Nasopharyngeal(NP) swabs in vial transport medium  Result Value Ref Range Status   SARS Coronavirus 2 by RT PCR NEGATIVE NEGATIVE Final    Comment: (NOTE) SARS-CoV-2 target nucleic acids are NOT DETECTED.  The SARS-CoV-2 RNA is generally detectable in upper respiratory specimens during the acute phase of infection. The lowest concentration of SARS-CoV-2 viral copies this assay can detect is 138 copies/mL. A negative result does not preclude SARS-Cov-2 infection and should not be used as the sole basis  for treatment or other patient management decisions. A negative result may occur with  improper specimen collection/handling, submission of specimen other than nasopharyngeal swab, presence of viral mutation(s) within the areas targeted by this assay, and inadequate number of viral copies(<138 copies/mL). A negative result must be combined with clinical observations, patient history, and epidemiological information. The expected result is Negative.  Fact Sheet for Patients:  EntrepreneurPulse.com.au  Fact Sheet for Healthcare Providers:  IncredibleEmployment.be  This test is no t yet approved or cleared by the [place] FDA and  has been authorized for detection and/or diagnosis of  SARS-CoV-2 by FDA under an Emergency Use Authorization (EUA). This EUA will remain  in effect (meaning this test can be used) for the duration of the COVID-19 declaration under Section 564(b)(1) of the Act, 21 U.S.C.section 360bbb-3(b)(1), unless the authorization is terminated  or revoked sooner.       Influenza A by PCR NEGATIVE NEGATIVE Final   Influenza B by PCR NEGATIVE NEGATIVE Final    Comment: (NOTE) The Xpert Xpress SARS-CoV-2/FLU/RSV plus assay is intended as an aid in the diagnosis of influenza from Nasopharyngeal swab specimens and should not be used as a sole basis for treatment. Nasal washings and aspirates are unacceptable for Xpert Xpress SARS-CoV-2/FLU/RSV testing.  Fact Sheet for Patients: EntrepreneurPulse.com.au  Fact Sheet for Healthcare Providers: IncredibleEmployment.be  This test is not yet approved or cleared by the [place] FDA and has been authorized for detection and/or diagnosis of SARS-CoV-2 by FDA under an Emergency Use Authorization (EUA). This EUA will remain in effect (meaning this test can be used) for the duration of the COVID-19 declaration under Section 564(b)(1) of the Act, 21 U.S.C. section 360bbb-3(b)(1), unless the authorization is terminated or revoked.  Performed at [facility], [address] [place], [place] [address]   Culture, blood (Routine X 2) w Reflex to ID Panel     Status: Abnormal   Collection Time: 08/11/21  5:47 PM   Specimen: BLOOD  Result Value Ref Range Status   Specimen Description   Final    BLOOD LEFT HAND Performed at [facility], [address] [place], [place] [address]    Special Requests   Final    BOTTLES DRAWN AEROBIC AND ANAEROBIC Blood Culture adequate volume Performed at [facility], [address] [place], [place] [address]    Culture  Setup Time   Final    GRAM POSITIVE COCCI IN CHAINS IN BOTH AEROBIC AND ANAEROBIC  BOTTLES Gram Stain Report Called to,Read Back By and Verified With: JASON ROBBINS 08/12/21 @ 0507 BY SB Performed at [facility], [address] [place], [place] [address]    Culture (A)  Final    ENTEROCOCCUS FAECALIS SUSCEPTIBILITIES PERFORMED ON PREVIOUS CULTURE WITHIN THE LAST 5 DAYS. Performed at [facility], [place] [address], [place], [place] [address]    Report Status 08/14/2021 FINAL  Final  Culture, blood (Routine X 2) w Reflex to ID Panel     Status: Abnormal   Collection Time: 08/11/21  5:47 PM   Specimen: BLOOD  Result Value Ref Range Status   Specimen Description   Final    BLOOD FOA Performed at [facility], [address] [place], [place] [address]    Special Requests   Final    BOTTLES DRAWN AEROBIC AND ANAEROBIC Blood Culture adequate volume Performed at [facility], [address], [place], [place] [address]    Culture  Setup  Time   Final    GRAM POSITIVE COCCI IN CHAINS IN BOTH AEROBIC AND ANAEROBIC BOTTLES Gram Stain Report Called to,Read Back By and Verified With: JASON ROBBINS 08/12/21 @ 0507 BY SB Performed at [facility], [place] [address] [place], [place] [address]    Culture ENTEROCOCCUS FAECALIS (A)  Final   Report Status 08/14/2021 FINAL  Final   Organism ID, Bacteria ENTEROCOCCUS FAECALIS  Final      Susceptibility   Enterococcus faecalis - MIC*    AMPICILLIN <=2 SENSITIVE Sensitive     VANCOMYCIN 2 SENSITIVE Sensitive     GENTAMICIN SYNERGY SENSITIVE Sensitive     * ENTEROCOCCUS FAECALIS  Blood Culture ID Panel (Reflexed)     Status: Abnormal   Collection Time: 08/11/21  5:47 PM  Result Value Ref Range Status   Enterococcus faecalis DETECTED (A) NOT DETECTED Final    Comment: CRITICAL RESULT CALLED TO, READ BACK BY AND VERIFIED WITH: JASON ROBBINS 08/12/21 @ 0507 BY SB    Enterococcus Faecium NOT DETECTED NOT DETECTED Final   Listeria monocytogenes NOT DETECTED NOT DETECTED Final   Staphylococcus species  NOT DETECTED NOT DETECTED Final   Staphylococcus aureus (BCID) NOT DETECTED NOT DETECTED Final   Staphylococcus epidermidis NOT DETECTED NOT DETECTED Final   Staphylococcus lugdunensis NOT DETECTED NOT DETECTED Final   Streptococcus species NOT DETECTED NOT DETECTED Final   Streptococcus agalactiae NOT DETECTED NOT DETECTED Final   Streptococcus pneumoniae NOT DETECTED NOT DETECTED Final   Streptococcus pyogenes NOT DETECTED NOT DETECTED Final   A.calcoaceticus-baumannii NOT DETECTED NOT DETECTED Final   Bacteroides fragilis NOT DETECTED NOT DETECTED Final   Enterobacterales NOT DETECTED NOT DETECTED Final   Enterobacter cloacae complex NOT DETECTED NOT DETECTED Final   Escherichia coli NOT DETECTED NOT DETECTED Final   Klebsiella aerogenes NOT DETECTED NOT DETECTED Final   Klebsiella oxytoca NOT DETECTED NOT DETECTED Final   Klebsiella pneumoniae NOT DETECTED NOT DETECTED Final   Proteus species NOT DETECTED NOT DETECTED Final   Salmonella species NOT DETECTED NOT DETECTED Final   Serratia marcescens NOT DETECTED NOT DETECTED Final   Haemophilus influenzae NOT DETECTED NOT DETECTED Final   Neisseria meningitidis NOT DETECTED NOT DETECTED Final   Pseudomonas aeruginosa NOT DETECTED NOT DETECTED Final   Stenotrophomonas maltophilia NOT DETECTED NOT DETECTED Final   Candida albicans NOT DETECTED NOT DETECTED Final   Candida auris NOT DETECTED NOT DETECTED Final   Candida glabrata NOT DETECTED NOT DETECTED Final   Candida krusei NOT DETECTED NOT DETECTED Final   Candida parapsilosis NOT DETECTED NOT DETECTED Final   Candida tropicalis NOT DETECTED NOT DETECTED Final   Cryptococcus neoformans/gattii NOT DETECTED NOT DETECTED Final   Vancomycin resistance NOT DETECTED NOT DETECTED Final    Comment: Performed at [facility], [address] [place], [place] [address]  CULTURE, BLOOD (ROUTINE X 2) w Reflex to ID Panel     Status: None   Collection Time: 08/12/21 11:58 PM    Specimen: BLOOD  Result Value Ref Range Status   Specimen Description BLOOD BLOOD LEFT HAND  Final   Special Requests   Final    BOTTLES DRAWN AEROBIC AND ANAEROBIC Blood Culture adequate volume   Culture   Final    NO GROWTH 5 DAYS Performed at [facility], [address] [place], [place] [address]    Report Status 08/18/2021 FINAL  Final  CULTURE, BLOOD (ROUTINE X 2) w Reflex to ID Panel  Status: None   Collection Time: 08/12/21 11:58 PM   Specimen: BLOOD  Result Value Ref Range Status   Specimen Description BLOOD LEFT RADIAL  Final   Special Requests   Final    BOTTLES DRAWN AEROBIC AND ANAEROBIC Blood Culture results may not be optimal due to an inadequate volume of blood received in culture bottles   Culture   Final    NO GROWTH 5 DAYS Performed at [facility], [address] [place], [place] [phone]    Report Status 08/18/2021 FINAL  Final  Resp Panel by RT-PCR (Flu A&B, Covid) Nasopharyngeal Swab     Status: None   Collection Time: 08/21/21  9:50 AM   Specimen: Nasopharyngeal Swab; Nasopharyngeal(NP) swabs in vial transport medium  Result Value Ref Range Status   SARS Coronavirus 2 by RT PCR NEGATIVE NEGATIVE Final    Comment: (NOTE) SARS-CoV-2 target nucleic acids are NOT DETECTED.  The SARS-CoV-2 RNA is generally detectable in upper respiratory specimens during the acute phase of infection. The lowest concentration of SARS-CoV-2 viral copies this assay can detect is 138 copies/mL. A negative result does not preclude SARS-Cov-2 infection and should not be used as the sole basis for treatment or other patient management decisions. A negative result may occur with  improper specimen collection/handling, submission of specimen other than nasopharyngeal swab, presence of viral mutation(s) within the areas targeted by this assay, and inadequate number of viral copies(<138 copies/mL). A negative result must be combined with clinical observations,  patient history, and epidemiological information. The expected result is Negative.  Fact Sheet for Patients:  EntrepreneurPulse.com.au  Fact Sheet for Healthcare Providers:  IncredibleEmployment.be  This test is no t yet approved or cleared by the [place] FDA and  has been authorized for detection and/or diagnosis of SARS-CoV-2 by FDA under an Emergency Use Authorization (EUA). This EUA will remain  in effect (meaning this test can be used) for the duration of the COVID-19 declaration under Section 564(b)(1) of the Act, 21 U.S.C.section 360bbb-3(b)(1), unless the authorization is terminated  or revoked sooner.       Influenza A by PCR NEGATIVE NEGATIVE Final   Influenza B by PCR NEGATIVE NEGATIVE Final    Comment: (NOTE) The Xpert Xpress SARS-CoV-2/FLU/RSV plus assay is intended as an aid in the diagnosis of influenza from Nasopharyngeal swab specimens and should not be used as a sole basis for treatment. Nasal washings and aspirates are unacceptable for Xpert Xpress SARS-CoV-2/FLU/RSV testing.  Fact Sheet for Patients: EntrepreneurPulse.com.au  Fact Sheet for Healthcare Providers: IncredibleEmployment.be  This test is not yet approved or cleared by the [place] FDA and has been authorized for detection and/or diagnosis of SARS-CoV-2 by FDA under an Emergency Use Authorization (EUA). This EUA will remain in effect (meaning this test can be used) for the duration of the COVID-19 declaration under Section 564(b)(1) of the Act, 21 U.S.C. section 360bbb-3(b)(1), unless the authorization is terminated or revoked.  Performed at [facility], [address] [place], [place] [address]     Coagulation Studies: No results for input(s): LABPROT, INR in the last 72 hours.  Urinalysis: No results for input(s): COLORURINE, LABSPEC, PHURINE, GLUCOSEU, HGBUR, BILIRUBINUR, KETONESUR,  PROTEINUR, UROBILINOGEN, NITRITE, LEUKOCYTESUR in the last 72 hours.  Invalid input(s): APPERANCEUR    Imaging: CT HEAD WO CONTRAST (5MM)  Result Date: 08/25/2021 CLINICAL DATA:  Mental status change, unknown cause EXAM: CT HEAD WITHOUT CONTRAST TECHNIQUE: Contiguous axial images were obtained from the base of the  skull through the vertex without intravenous contrast. COMPARISON:  None. FINDINGS: Brain: There is no acute intracranial hemorrhage, mass effect, or edema. Gray-white differentiation is preserved. There is no extra-axial fluid collection. Ventricles and sulci are within normal limits in size and configuration. Vascular: There is atherosclerotic calcification at the skull base. Skull: Calvarium is unremarkable. Sinuses/Orbits: No acute finding. Other: None. IMPRESSION: No acute intracranial abnormality. Electronically Signed   By: Macy Mis M.D.   On: 08/25/2021 18:02   MR BRAIN WO CONTRAST  Result Date: 08/26/2021 CLINICAL DATA:  Altered mental status, confusion EXAM: MRI HEAD WITHOUT CONTRAST TECHNIQUE: Multiplanar, multiecho pulse sequences of the brain and surrounding structures were obtained without intravenous contrast. COMPARISON:  Noncontrast CT head 1 day prior FINDINGS: Brain: There is a punctate focus of diffusion restriction in the left posterior temporal lobe cortex with appreciable low ADC signal. A similar more ill-defined focus of elevated DWI signal with apparent low ADC signal is seen in the right insular cortex. Additional ill-defined foci of elevated DWI signal in the bilateral corona radiata are seen without corresponding low ADC signal favored to reflect T2 shine through artifact. There is no evidence of large vessel territorial infarct. There is no evidence of acute intracranial hemorrhage. The ventricles are not enlarged. Scattered foci of FLAIR signal abnormality in the subcortical and periventricular white matter are nonspecific but likely reflect sequela of mild  chronic white matter microangiopathy. There is no mass lesion.  There is no midline shift. Vascular: Normal flow voids. Skull and upper cervical spine: Marrow signal in the upper cervical spine is diffusely abnormal, as seen on prior thoracolumbar spine MRIs, nonspecific. Sinuses/Orbits: The paranasal sinuses are clear. The globes and orbits are unremarkable. Other: None. IMPRESSION: 1. Probable punctate acute infarcts in the posterior left temporal lobe and right insular cortex. Differential includes T2 shine through artifact. 2. Mild chronic white matter microangiopathy. Electronically Signed   By: Valetta Mole M.D.   On: 08/26/2021 12:34   US Carotid Bilateral  Result Date: 08/27/2021 CLINICAL DATA:  Stroke. EXAM: BILATERAL CAROTID DUPLEX ULTRASOUND TECHNIQUE: Pearline Cables scale imaging, color Doppler and duplex ultrasound were performed of bilateral carotid and vertebral arteries in the neck. COMPARISON:  None. FINDINGS: Criteria: Quantification of carotid stenosis is based on velocity parameters that correlate the residual internal carotid diameter with NASCET-based stenosis levels, using the diameter of the distal internal carotid lumen as the denominator for stenosis measurement. The following velocity measurements were obtained: RIGHT ICA: 55/8 cm/sec CCA: 24/2 cm/sec SYSTOLIC ICA/CCA RATIO:  0.7 ECA: 29 cm/sec LEFT ICA: 52/13 cm/sec CCA: 68/3 cm/sec SYSTOLIC ICA/CCA RATIO:  1.1 ECA: 65 cm/sec RIGHT CAROTID ARTERY: Right carotid arteries are patent without significant stenosis. Normal waveforms and velocities in the internal carotid artery. External carotid artery is patent with normal waveform. RIGHT VERTEBRAL ARTERY: Antegrade flow and normal waveform in the right vertebral artery. LEFT CAROTID ARTERY: Small amount of echogenic plaque at the left carotid bulb. Normal waveforms and velocities in the internal carotid artery. External carotid artery is patent with normal waveform. LEFT VERTEBRAL ARTERY: Antegrade  flow and normal waveform in the left vertebral artery. Other: Right internal jugular vein is small and echogenic and suggestive for chronic occlusion. 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 IMPRESSION: 1. Right carotid arteries are patent without significant plaque or stenosis. 2. Small amount of plaque at the left carotid bulb. Estimated degree of stenosis in left internal carotid artery  is less than 50%. 3. Patent vertebral arteries bilaterally. 4. 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 Electronically Signed   By: Markus Daft M.D.   On: 08/27/2021 08:39     Medications:    sodium chloride 10 mL/hr at 08/27/21 0035   ampicillin (OMNIPEN) IV 2 g (08/27/21 0039)   cefTRIAXone (ROCEPHIN)  IV 2 g (08/27/21 0215)     apixaban  2.5 mg Oral BID   Chlorhexidine Gluconate Cloth  6 each Topical Q0600   epoetin (EPOGEN/PROCRIT) injection  4,000 Units Intravenous Q M,W,F-HD   feeding supplement (NEPRO CARB STEADY)  237 mL Oral TID BM   lidocaine  2 patch Transdermal Q24H   nicotine  21 mg Transdermal Daily   senna-docusate  1 tablet Oral BID   sevelamer carbonate  1,600 mg Oral TID WC   sodium chloride, acetaminophen, bisacodyl, hydrALAZINE, LORazepam, methocarbamol, Muscle Rub, ondansetron (ZOFRAN) IV, oxyCODONE-acetaminophen, polyethylene glycol, sodium chloride flush  Assessment/ Plan:  [name] [name] is a 60 y.o. black male with end stage renal disease on hemodialysis, hypertension, congestive heart failure, substance abuse who is admitted to [facility] on 08/08/2021 for Lower back pain [M54.50] Acute midline low back pain without sciatica [M54.50] At risk for inadequate pain control [Z91.89] Back pain [M54.9] Found to have eneterococcus faecalis bacteremia and osteomyelitis of C5-C6.   [facility] MWF [facility] Right AVG Hero 90.5kg  #ESRD on HD MWF Patient received dialysis  treatment on Friday, volume and electrolyte status acceptable,no additional dialysis required Will continue MWF schedule  #Hypotension BP 130/60 this morning  #Anemia of CKD Lab Results  Component Value Date   HGB 9.4 (L) 08/26/2021   Will continue Epogen with HD treatments  #Secondary Hyperparathyroidism Lab Results  Component Value Date   PTH 583.2 (H) 12/11/2012   CALCIUM 9.7 08/26/2021   CAION 1.27 (H) 12/26/2012   PHOS 8.0 (H) 08/24/2021     #Osteomyelitis: C5-C6  and L4-L5  Continue management per primary team.  #Altered Mental Status Patient appears drowsy, uncooperative with care, gets agitated easily Delirium precautions in place   LOS: 18 Elmor Kost 9/18/20229:17 AM

## 2021-08-27 NOTE — Progress Notes (Signed)
Pt's PICC catheter appeared to have been pulled out approximately 5-6 cm.  Pt's primary RN was advised to call MD for management and was recommended to obtain order to discontinue/remove PICC line by IR--- PICC line was placed by IR; central line was most probably tunneled.

## 2021-08-27 NOTE — Consult Note (Signed)
NEUROLOGY CONSULTATION NOTE   Date of service: August 27, 2021 Patient Name: [name] [name] MRN:  [id number] DOB:  17-Oct-1961 Reason for consult: probable acute stroke on MRI Requesting physician: Kathie Dike MD _ _ _   _ __   _ __ _ _  __ __   _ __   __ _  History of Present Illness   Patient unable to provide history 2/2 severe encephalopathy. Per progress note from Dr. Roderic Palau, "59 y.o. male with medical history significant of ESRD-HD, hypertension, tobacco abuse, polysubstance abuse, diverticulitis, anemia, jugular vein thrombosis, GI bleeding, right pneumothorax presented with worsening lower back pain.  He was found to have sepsis secondary to Enterococcus faecalis bacteremia.  ID was consulted and he has been on ampicillin and ceftriaxone.  He was found to have possible developing osteomyelitis in C5-C6 with possible early left psoas abscess formation and possible early discitis in L4-L5.  Nephrology was also consulted for continuation of dialysis.  TEE showed aortic valve endocarditis.  PT recommended SNF placement.  He is currently medically stable for discharge.  He was supposed to be discharged to SNF on 08/21/2021 but discharge was held because he could not sit up for dialysis."  Subsequently he became increasingly encephalopathic. MRI brain was performed which showed probable punctate acute infarcts in the posterior left temporal lobe and right insular cortex (personal review).  He is taking eliquis for SVC thrombosis that was briefly held 2/2 c/f GIB then restarted.  TEE  1. Left ventricular ejection fraction, by estimation, is 55 to 60%. The ?left ventricle has normal function. The left ventricular internal cavity ?size was mildly dilated. ? 2. Right ventricular systolic function is mildly reduced. The right ?ventricular size is mildly enlarged. ? 3. Left atrial size was moderately dilated. No left atrial/left atrial ?appendage thrombus was detected. ? 4. Right atrial size was mild  to moderately dilated. ? 5. The mitral valve is myxomatous. Moderate to severe mitral valve ?regurgitation. ? 6. 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 ? 7. 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 The aortic ?valve is abnormal. Aortic valve regurgitation is severe. Mild to moderate ?aortic valve sclerosis/calcification is present, without any evidence of ?aortic stenosis.   Stroke workup:  Carotid US  1. Right carotid arteries are patent without significant plaque or stenosis. 2. Small amount of plaque at the left carotid bulb. Estimated degree of stenosis in left internal carotid artery is less than 50%. 3. Patent vertebral arteries bilaterally. 4. 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  LDL 75.1 A1c 4.3    ROS   UTA 2/2 encephalopathy  Past History   Past Medical History:  Diagnosis Date   Anemia of chronic disease    BL Hgb ~10   CHF (congestive heart failure) ([place])    pt denies 04/01/14   Diverticulitis    End-stage renal disease on hemodialysis (HCC)    HD m/w/f - right arm graft   History of blood transfusion    Hypertension    Pneumonia    hx of   Pneumonia    Polysubstance abuse ([employer])    Renal insufficiency    Secondary hyperparathyroidism ([place])    Sleep disturbance    Tobacco abuse    Past Surgical History:  Procedure Laterality Date   APPENDECTOMY     CHEST TUBE INSERTION  02/2013   COLONOSCOPY  12/16/2012   Procedure: COLONOSCOPY;  Surgeon: Milus Banister, MD;  Location: [facility];  Service: Endoscopy;  Laterality: N/A;   IR FLUORO GUIDE CV LINE LEFT  08/18/2021   IR US GUIDANCE  08/18/2021   PLEURAL EFFUSION DRAINAGE  12/18/2012   Procedure: DRAINAGE OF PLEURAL EFFUSION;  Surgeon: Melrose Nakayama, MD;  Location: [place];  Service: Thoracic;  Laterality: Right;   surgery for horseshoe kidney     TALC PLEURODESIS Right 02/05/2013   Procedure: Pietro Cassis;  Surgeon:  Melrose Nakayama, MD;  Location: [place];  Service: Thoracic;  Laterality: Right;   TEE WITHOUT CARDIOVERSION N/A 01/16/2013   Procedure: TRANSESOPHAGEAL ECHOCARDIOGRAM (TEE);  Surgeon: Sanda Klein, MD;  Location: [facility]COPY;  Service: Cardiovascular;  Laterality: N/A;   TEE WITHOUT CARDIOVERSION Right 08/16/2021   Procedure: TRANSESOPHAGEAL ECHOCARDIOGRAM (TEE);  Surgeon: Dionisio David, MD;  Location: [facility];  Service: Cardiovascular;  Laterality: Right;   VIDEO ASSISTED THORACOSCOPY  12/18/2012   Procedure: VIDEO ASSISTED THORACOSCOPY;  Surgeon: Melrose Nakayama, MD;  Location: [place];  Service: Thoracic;  Laterality: Right;  pleural peel   VIDEO ASSISTED THORACOSCOPY (VATS)/THOROCOTOMY Right 02/19/2013   Procedure: VIDEO ASSISTED THORACOSCOPY (VATS)/THOROCOTOMY;  Surgeon: Melrose Nakayama, MD;  Location: [place];  Service: Thoracic;  Laterality: Right;  Right Video Assisted Thoracoscopy, Right Thoracotomy, Decortication,    VIDEO BRONCHOSCOPY WITH INSERTION OF INTERBRONCHIAL VALVE (IBV)  01/09/2013   Procedure: VIDEO BRONCHOSCOPY WITH INSERTION OF INTERBRONCHIAL VALVE (IBV);  Surgeon: Melrose Nakayama, MD;  Location: [place];  Service: Thoracic;  Laterality: N/A;  placement of 2 Intrabronchial valves in right lower lobe   VIDEO BRONCHOSCOPY WITH INSERTION OF INTERBRONCHIAL VALVE (IBV) Right 02/19/2013   Procedure: VIDEO BRONCHOSCOPY WITH INSERTION OF INTERBRONCHIAL VALVE (IBV);  Surgeon: Melrose Nakayama, MD;  Location: [place];  Service: Thoracic;  Laterality: Right;  removal of two interbronchial valves right chest   Family History  Problem Relation Age of Onset   Thyroid disease Mother    Renal Disease Maternal Grandmother    Diabetes Maternal Grandmother    Hypertension Maternal Aunt    Hypertension Maternal Uncle    Social History   Socioeconomic History   Marital status: Single    Spouse name: Not on file   Number of children: 2   Years of education: 12   Highest  education level: Not on file  Occupational History   Occupation: disabled    Comment: previously drove trucks  Tobacco Use   Smoking status: Every Day    Packs/day: 6.00    Years: 20.00    Pack years: 120.00    Types: Cigarettes   Smokeless tobacco: Current  Vaping Use   Vaping Use: Never used  Substance and Sexual Activity   Alcohol use: Yes    Alcohol/week: 3.0 standard drinks    Types: 3 Cans of beer per week    Comment: occasional   Drug use: Yes    Types: Marijuana, Cocaine    Comment: remote cocaine abuse, ongoing marijuana use.   Sexual activity: Not on file  Other Topics Concern   Not on file  Social History Narrative   Not on file   Social Determinants of Health   Financial Resource Strain: Not on file  Food Insecurity: Not on file  Transportation Needs: Not on file  Physical Activity: Not on file  Stress: Not on file  Social Connections: Not on file   Allergies  Allergen Reactions   Other     Other reaction(s): Other (See Comments) blister  Tape Other (See Comments)    Plastic Tape Causes blister    Medications   Medications Prior to Admission  Medication Sig Dispense Refill Last Dose   acetaminophen (TYLENOL) 500 MG tablet Take 1,000 mg by mouth every 8 (eight) hours as needed for moderate pain.   Unknown at PRN   amLODipine (NORVASC) 10 MG tablet Take 10 mg by mouth every evening.   Unknown at Unknown   apixaban (ELIQUIS) 5 MG TABS tablet Take 5 mg by mouth daily.   08/07/2021 at Unknown   calcium acetate (PHOSLO) 667 MG capsule Take 2,668 mg by mouth 3 (three) times daily with meals.    Unknown at Unknown   promethazine (PHENERGAN) 25 MG tablet Take 25 mg by mouth every 8 (eight) hours as needed for nausea/vomiting.   Unknown at PRN     Vitals   Vitals:   08/26/21 1938 08/27/21 0816 08/27/21 1159 08/27/21 1701  BP: 140/67 130/60 140/61 (!) 143/70  Pulse: 97 85 79 100  Resp: 17 17 17 15   Temp: 98.4 F (36.9 C) (!) 97.5 F (36.4 C) 97.6 F  (36.4 C) 97.8 F (36.6 C)  TempSrc:  Axillary Axillary   SpO2: 100% 100% 94% 100%  Weight:      Height:         Body mass index is 26.47 kg/m.  Physical Exam   Physical Exam Gen: obtunded but protecting airway Resp: CTAB, no w/r/r CV: RRR, no m/g/r; nml S1 and S2. 2+ symmetric peripheral pulses.  Neuro: *MS: obtunded but protecting airway *Speech: no attempts at speech *CN: PERRL 74mm, (+) corneals, oculocephalics, gag *Motor & sensory: minimal withdrawal in all extremities to noxious stimuli *Reflexes: 1+ symmetric with mute toes bilat *Coordination, gait: UTA  NIHSS = 21 (favored to be 2/2 encephalopathy, not incidental stroke findings)  1a Level of Conscious.: 2 1b LOC Questions: 2 1c LOC Commands: 2 2 Best Gaze: 0 3 Visual: 0 4 Facial Palsy: 0 5a Motor Arm - left: 3 5b Motor Arm - Right: 3 6a Motor Leg - Left: 3 6b Motor Leg - Right: 3 7 Limb Ataxia: 0 8 Sensory: 0 9 Best Language: 3 10 Dysarthria: 0 11 Extinct. and Inatten.: 0  TOTAL: 21   Premorbid mRS = 3   Labs   CBC:  Recent Labs  Lab 08/23/21 0606 08/24/21 0503 08/25/21 0605 08/26/21 0525  WBC 17.2*   < > 11.5* 9.7  NEUTROABS 15.0*  --   --   --   HGB 10.2*   < > 9.3* 9.4*  HCT 33.3*   < > 28.8* 30.1*  MCV 98.2   < > 96.6 99.0  PLT 240   < > 234 190   < > = values in this interval not displayed.    Basic Metabolic Panel:  Lab Results  Component Value Date   NA 138 08/26/2021   K 3.8 08/26/2021   CO2 29 08/26/2021   GLUCOSE 120 (H) 08/26/2021   BUN 50 (H) 08/26/2021   CREATININE 6.84 (H) 08/26/2021   CALCIUM 9.7 08/26/2021   GFRNONAA 9 (L) 08/26/2021   GFRAA 3 (L) 12/05/2019   Lipid Panel: No results found for: LDLCALC HgbA1c:  Lab Results  Component Value Date   HGBA1C 4.3 (L) 08/26/2021   Urine Drug Screen: No results found for: LABOPIA, COCAINSCRNUR, LABBENZ, AMPHETMU, THCU, LABBARB  Alcohol Level No results found for: [facility]   Impression   60 yo with ESRD, HTN,  tobacco and  poly substance abuse presented with worsening lower back pain. Found to have sepsis 2/2 e. 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 Neurology consulted for possible punctate acute/subacute infarcts in L temporal lobe and R insula. They are favored to be incidental given that they are extremely small. Stroke workup is completed. 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 His encephalopathy is favored to be 2/2 sepsis however rEEG is warranted to r/o nonconvulsive seizure activity.  Recommendations   - Stroke workup completed - Agree with continuation of eliquis - rEEG Mon AM for encephalopathy  Will continue to follow. ______________________________________________________________________   Thank you for the opportunity to take part in the care of this patient. If you have any further questions, please contact the neurology consultation attending.  Signed,  Su Monks, MD [facility] [phone]  If 7pm- 7am, please page neurology on call as listed in [place].

## 2021-08-27 NOTE — Progress Notes (Signed)
Patient ID: [name] [name], male   DOB: 11-14-1961, 60 y.o.   MRN: [id number]  PROGRESS NOTE    [name] [name]  XVQ:[id number] DOB: May 03, 1961 DOA: 08/08/2021 PCP: Pcp, No   Brief Narrative:  60 y.o. male with medical history significant of ESRD-HD, hypertension, tobacco abuse, polysubstance abuse, diverticulitis, anemia, jugular vein thrombosis, GI bleeding, right pneumothorax presented with worsening lower back pain.  He was found to have sepsis secondary to Enterococcus faecalis bacteremia.  ID was consulted and he has been on ampicillin and ceftriaxone.  He was found to have possible developing osteomyelitis in C5-C6 with possible early left psoas abscess formation and possible early discitis in L4-L5.  Nephrology was also consulted for continuation of dialysis.  TEE showed aortic valve endocarditis.  PT recommended SNF placement.  Plans were for SNF discharge with outpatient antibiotics, but patient has had waxing and waning mental status.  He has had periods of lethargy and agitation.  This does not appear to be his baseline.  MRI brain was performed that indicated possible acute infarcts.  Neurology consulted.  Assessment & Plan:   Sepsis: Possibly evolving on admission Enterococcus faecalis bacteremia Aortic valve endocarditis -ID following.  Currently on Rocephin and ampicillin. -TTE negative for vegetations. 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  ID is recommending 6 to weeks of ampicillin and Rocephin till 09/23/2021 followed by p.o. amoxicillin for a few weeks.  -Status post tunneled catheter placement on 08/18/2021 by IR -Blood cultures from 08/11/2021 grew E faecalis.  Blood cultures from 08/12/2021 have been negative so far. -Sepsis has resolved.  Currently hemodynamically stable with no temperature spikes.  Severe lower back pain Possible developing osteomyelitis in C5-C6 Left psoas muscle infection with superimposed 1.3 cm phlegmon/early abscess formation Possible early  discitis in L4-L5 -Continue antibiotics as above.  Continue pain management and bowel regimen -Patient reports that his pain is not any worse.  WBC count trending down and he has been afebrile.   End-stage renal disease on hemodialysis -Nephrology following.  Dialysis as per nephrology schedule -Dialysis schedule changed from M WF to TTS -We will need to ensure that patient is able to set up for entire dialysis session prior to discharge  Rectal bleeding -Noted to have bright red blood with bowel movement t on 9/16 no further blood with further bowel movements since then -Suspect this may be related to hemorrhoids -Hemoglobin has been stable -If he continues to have bleeding, may need to have GI involvement. -Eliquis was briefly put on hold, but was resumed when he did not have any further bleeding  Acute encephalopathy -Noted to be more confused and agitated over the past few days -CT scan yesterday was unremarkable, no clear metabolic cause -MRI brain confirms acute infarct -EEG is also been ordered -Neurology consulted, appreciate input -Encephalopathy possibly related to underlying infectious process versus hospital delirium -We will use Ativan for agitation as needed  Acute CVA -MRI notes probable punctate acute infarcts in the posterior left temporal lobe and right insular cortex -Neurology consulted, appreciate input  Anemia of chronic disease -From renal failure.  Hemoglobin stable.  Monitor intermittently.  Polysubstance abuse and tobacco abuse -Patient was counseled regarding cessation by prior hospitalist.  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 Keturah Barre recommendations.    Hypertension Hypotension -Blood pressure currently stable.  Off amlodipine  Chronic combined CHF -LVEF of 30 to  35% as per TTE on 08/13/2021.  Volume managed by dialysis.  Strict  input output.  Daily weights.  Fluid restriction  Depression -Patient's mother reports that patient has been depressed for the past 2 years since the loss of his child -Will need outpatient psychiatry follow-up  Generalized conditioning -PT/OT recommend SNF placement.  Social worker following.  DVT prophylaxis: Eliquis Code Status: Full Family Communication: Updated patient's cousin at the bedside Disposition Plan: Status is: Inpatient  Remains inpatient appropriate because:Inpatient level of care appropriate due to severity of illness  Dispo: The patient is from: Home              Anticipated d/c is to: SNF              Patient currently is medically stable to d/c.    Difficult to place patient No  Consultants: ID/cardiology/nephrology/neurology  Procedures: TTE/TEE Tunneled catheter placement by IR on 08/18/2021  Antimicrobials:  Anti-infectives (From admission, onward)    Start     Dose/Rate Route Frequency Ordered Stop   08/21/21 0000  ampicillin IVPB        2 g Intravenous Every 12 hours 08/21/21 0940 09/27/21 2359   08/21/21 0000  cefTRIAXone (ROCEPHIN) IVPB        2 g Intravenous Every 12 hours 08/21/21 0940 09/27/21 2359   08/14/21 1530  cefTRIAXone (ROCEPHIN) 2 g in sodium chloride 0.9 % 100 mL IVPB        2 g 200 mL/hr over 30 Minutes Intravenous Every 12 hours 08/14/21 1443     08/14/21 1200  vancomycin (VANCOCIN) IVPB 1000 mg/200 mL premix  Status:  Discontinued        1,000 mg 200 mL/hr over 60 Minutes Intravenous Every M-W-F (Hemodialysis) 08/11/21 1831 08/12/21 0517   08/12/21 2200  cefTRIAXone (ROCEPHIN) 2 g in sodium chloride 0.9 % 100 mL IVPB  Status:  Discontinued        2 g 200 mL/hr over 30 Minutes Intravenous Every 12 hours 08/12/21 1923 08/14/21 1443   08/12/21 0615  ampicillin (OMNIPEN) 2 g in sodium chloride 0.9 % 100 mL IVPB        2 g 300 mL/hr over 20 Minutes Intravenous Every 12 hours 08/12/21 0520     08/11/21 1930  vancomycin (VANCOREADY)  IVPB 2000 mg/400 mL        2,000 mg 200 mL/hr over 120 Minutes Intravenous  Once 08/11/21 1831 08/12/21 0156   08/11/21 1900  cefTRIAXone (ROCEPHIN) 2 g in sodium chloride 0.9 % 100 mL IVPB  Status:  Discontinued        2 g 200 mL/hr over 30 Minutes Intravenous Every 12 hours 08/11/21 1829 08/12/21 0517        Subjective: Patient noted to be more confused today.  He is heard yelling from his room.  Does not seem to realize he is in the hospital.  Objective: Vitals:   08/27/21 0816 08/27/21 1159 08/27/21 1701 08/27/21 2001  BP: 130/60 140/61 (!) 143/70 138/68  Pulse: 85 79 100 94  Resp: 17 17 15 16   Temp: (!) 97.5 F (36.4 C) 97.6 F (36.4 C) 97.8 F (36.6 C) (!) 97.5 F (36.4 C)  TempSrc: Axillary Axillary  Axillary  SpO2: 100% 94% 100% 100%  Weight:      Height:       No intake or output data in the 24 hours ending 08/27/21 2100  Filed Weights   08/08/21 0625 08/16/21 0754  Weight: 91 kg  91 kg    Examination:  General exam: Alert, awake, laying in bed, agitated Respiratory system: Clear to auscultation. Respiratory effort normal. Cardiovascular system:RRR. No murmurs, rubs, gallops. Gastrointestinal system: Abdomen is nondistended, soft and nontender. No organomegaly or masses felt. Normal bowel sounds heard. Central nervous system: No focal neurological deficits. Extremities: No C/C/E, +pedal pulses Skin: No rashes, lesions or ulcers Psychiatry: Confused and agitated     Data Reviewed: I have personally reviewed following labs and imaging studies  CBC: Recent Labs  Lab 08/21/21 0630 08/23/21 0606 08/24/21 0503 08/25/21 0605 08/26/21 0525  WBC 10.4 17.2* 12.5* 11.5* 9.7  NEUTROABS  --  15.0*  --   --   --   HGB 8.3* 10.2* 9.7* 9.3* 9.4*  HCT 28.2* 33.3* 30.1* 28.8* 30.1*  MCV 97.9 98.2 98.4 96.6 99.0  PLT 192 240 253 234 627   Basic Metabolic Panel: Recent Labs  Lab 08/21/21 0630 08/23/21 0606 08/24/21 0503 08/25/21 0605 08/26/21 0525  NA  141 139 139 137 138  K 5.0 5.8* 4.8 4.8 3.8  CL 99 97* 95* 98 97*  CO2 29 25 28 28 29   GLUCOSE 103* 57* 81 85 120*  BUN 63* 63* 55* 73* 50*  CREATININE 9.88* 9.01* 7.13* 8.44* 6.84*  CALCIUM 10.6* 10.3 10.0 9.8 9.7  PHOS 7.7*  --  8.0*  --   --    GFR: Estimated Creatinine Clearance: 13.5 mL/min (A) (by C-G formula based on SCr of 6.84 mg/dL (H)). Liver Function Tests: Recent Labs  Lab 08/24/21 0503 08/26/21 0525  AST  --  30  ALT  --  19  ALKPHOS  --  72  BILITOT  --  0.8  PROT  --  7.2  ALBUMIN 2.5* 2.5*   No results for input(s): LIPASE, AMYLASE in the last 168 hours. Recent Labs  Lab 08/25/21 1528  AMMONIA 14   Coagulation Profile: No results for input(s): INR, PROTIME in the last 168 hours. Cardiac Enzymes: No results for input(s): CKTOTAL, CKMB, CKMBINDEX, TROPONINI in the last 168 hours. BNP (last 3 results) No results for input(s): PROBNP in the last 8760 hours. HbA1C: Recent Labs    08/26/21 1710  HGBA1C 4.3*   CBG: No results for input(s): GLUCAP in the last 168 hours.  Lipid Profile: Recent Labs    08/26/21 1710  LDLDIRECT 75.1   Thyroid Function Tests: No results for input(s): TSH, T4TOTAL, FREET4, T3FREE, THYROIDAB in the last 72 hours. Anemia Panel: No results for input(s): VITAMINB12, FOLATE, FERRITIN, TIBC, IRON, RETICCTPCT in the last 72 hours. Sepsis Labs: No results for input(s): PROCALCITON, LATICACIDVEN in the last 168 hours.   Recent Results (from the past 240 hour(s))  Resp Panel by RT-PCR (Flu A&B, Covid) Nasopharyngeal Swab     Status: None   Collection Time: 08/21/21  9:50 AM   Specimen: Nasopharyngeal Swab; Nasopharyngeal(NP) swabs in vial transport medium  Result Value Ref Range Status   SARS Coronavirus 2 by RT PCR NEGATIVE NEGATIVE Final    Comment: (NOTE) SARS-CoV-2 target nucleic acids are NOT DETECTED.  The SARS-CoV-2 RNA is generally detectable in upper respiratory specimens during the acute phase of infection. The  lowest concentration of SARS-CoV-2 viral copies this assay can detect is 138 copies/mL. A negative result does not preclude SARS-Cov-2 infection and should not be used as the sole basis for treatment or other patient management decisions. A negative result may occur with  improper specimen collection/handling, submission of specimen other than nasopharyngeal swab,  presence of viral mutation(s) within the areas targeted by this assay, and inadequate number of viral copies(<138 copies/mL). A negative result must be combined with clinical observations, patient history, and epidemiological information. The expected result is Negative.  Fact Sheet for Patients:  EntrepreneurPulse.com.au  Fact Sheet for Healthcare Providers:  IncredibleEmployment.be  This test is no t yet approved or cleared by the [place] FDA and  has been authorized for detection and/or diagnosis of SARS-CoV-2 by FDA under an Emergency Use Authorization (EUA). This EUA will remain  in effect (meaning this test can be used) for the duration of the COVID-19 declaration under Section 564(b)(1) of the Act, 21 U.S.C.section 360bbb-3(b)(1), unless the authorization is terminated  or revoked sooner.       Influenza A by PCR NEGATIVE NEGATIVE Final   Influenza B by PCR NEGATIVE NEGATIVE Final    Comment: (NOTE) The Xpert Xpress SARS-CoV-2/FLU/RSV plus assay is intended as an aid in the diagnosis of influenza from Nasopharyngeal swab specimens and should not be used as a sole basis for treatment. Nasal washings and aspirates are unacceptable for Xpert Xpress SARS-CoV-2/FLU/RSV testing.  Fact Sheet for Patients: EntrepreneurPulse.com.au  Fact Sheet for Healthcare Providers: IncredibleEmployment.be  This test is not yet approved or cleared by the [place] FDA and has been authorized for detection and/or diagnosis of SARS-CoV-2 by FDA under  an Emergency Use Authorization (EUA). This EUA will remain in effect (meaning this test can be used) for the duration of the COVID-19 declaration under Section 564(b)(1) of the Act, 21 U.S.C. section 360bbb-3(b)(1), unless the authorization is terminated or revoked.  Performed at [facility], [address] [place], [place] [address]           Radiology Studies: MR BRAIN WO CONTRAST  Result Date: 08/26/2021 CLINICAL DATA:  Altered mental status, confusion EXAM: MRI HEAD WITHOUT CONTRAST TECHNIQUE: Multiplanar, multiecho pulse sequences of the brain and surrounding structures were obtained without intravenous contrast. COMPARISON:  Noncontrast CT head 1 day prior FINDINGS: Brain: There is a punctate focus of diffusion restriction in the left posterior temporal lobe cortex with appreciable low ADC signal. A similar more ill-defined focus of elevated DWI signal with apparent low ADC signal is seen in the right insular cortex. Additional ill-defined foci of elevated DWI signal in the bilateral corona radiata are seen without corresponding low ADC signal favored to reflect T2 shine through artifact. There is no evidence of large vessel territorial infarct. There is no evidence of acute intracranial hemorrhage. The ventricles are not enlarged. Scattered foci of FLAIR signal abnormality in the subcortical and periventricular white matter are nonspecific but likely reflect sequela of mild chronic white matter microangiopathy. There is no mass lesion.  There is no midline shift. Vascular: Normal flow voids. Skull and upper cervical spine: Marrow signal in the upper cervical spine is diffusely abnormal, as seen on prior thoracolumbar spine MRIs, nonspecific. Sinuses/Orbits: The paranasal sinuses are clear. The globes and orbits are unremarkable. Other: None. IMPRESSION: 1. Probable punctate acute infarcts in the posterior left temporal lobe and right insular cortex. Differential includes T2  shine through artifact. 2. Mild chronic white matter microangiopathy. Electronically Signed   By: Valetta Mole M.D.   On: 08/26/2021 12:34   US Carotid Bilateral  Result Date: 08/27/2021 CLINICAL DATA:  Stroke. EXAM: BILATERAL CAROTID DUPLEX ULTRASOUND TECHNIQUE: Pearline Cables scale imaging, color Doppler and duplex ultrasound were performed of bilateral carotid and vertebral arteries in the neck. COMPARISON:  None. FINDINGS: Criteria: Quantification of  carotid stenosis is based on velocity parameters that correlate the residual internal carotid diameter with NASCET-based stenosis levels, using the diameter of the distal internal carotid lumen as the denominator for stenosis measurement. The following velocity measurements were obtained: RIGHT ICA: 55/8 cm/sec CCA: 94/1 cm/sec SYSTOLIC ICA/CCA RATIO:  0.7 ECA: 29 cm/sec LEFT ICA: 52/13 cm/sec CCA: 74/0 cm/sec SYSTOLIC ICA/CCA RATIO:  1.1 ECA: 65 cm/sec RIGHT CAROTID ARTERY: Right carotid arteries are patent without significant stenosis. Normal waveforms and velocities in the internal carotid artery. External carotid artery is patent with normal waveform. RIGHT VERTEBRAL ARTERY: Antegrade flow and normal waveform in the right vertebral artery. LEFT CAROTID ARTERY: Small amount of echogenic plaque at the left carotid bulb. Normal waveforms and velocities in the internal carotid artery. External carotid artery is patent with normal waveform. LEFT VERTEBRAL ARTERY: Antegrade flow and normal waveform in the left vertebral artery. Other: Right internal jugular vein is small and echogenic and suggestive for chronic occlusion. 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 IMPRESSION: 1. Right carotid arteries are patent without significant plaque or stenosis. 2. Small amount of plaque at the left carotid bulb. Estimated degree of stenosis in left internal carotid artery is less than 50%. 3. Patent  vertebral arteries bilaterally. 4. 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 Electronically Signed   By: Markus Daft M.D.   On: 08/27/2021 08:39        Scheduled Meds:  apixaban  2.5 mg Oral BID   Chlorhexidine Gluconate Cloth  6 each Topical Q0600   epoetin (EPOGEN/PROCRIT) injection  4,000 Units Intravenous Q M,W,F-HD   feeding supplement (NEPRO CARB STEADY)  237 mL Oral TID BM   lidocaine  2 patch Transdermal Q24H   nicotine  21 mg Transdermal Daily   senna-docusate  1 tablet Oral BID   sevelamer carbonate  1,600 mg Oral TID WC   Continuous Infusions:  sodium chloride 250 mL (08/27/21 2052)   ampicillin (OMNIPEN) IV 2 g (08/27/21 1200)   cefTRIAXone (ROCEPHIN)  IV 2 g (08/27/21 1201)          Kathie Dike, MD [facility] 08/27/2021, 9:00 PM

## 2021-08-28 DIAGNOSIS — G9340 Encephalopathy, unspecified: Secondary | ICD-10-CM | POA: Diagnosis not present

## 2021-08-28 DIAGNOSIS — R7881 Bacteremia: Secondary | ICD-10-CM | POA: Diagnosis not present

## 2021-08-28 DIAGNOSIS — D631 Anemia in chronic kidney disease: Secondary | ICD-10-CM | POA: Diagnosis not present

## 2021-08-28 DIAGNOSIS — N186 End stage renal disease: Secondary | ICD-10-CM | POA: Diagnosis not present

## 2021-08-28 DIAGNOSIS — M5440 Lumbago with sciatica, unspecified side: Secondary | ICD-10-CM | POA: Diagnosis not present

## 2021-08-28 LAB — RENAL FUNCTION PANEL
Albumin: 2.7 g/dL — ABNORMAL LOW (ref 3.5–5.0)
Anion gap: 18 — ABNORMAL HIGH (ref 5–15)
BUN: 71 mg/dL — ABNORMAL HIGH (ref 6–20)
CO2: 23 mmol/L (ref 22–32)
Calcium: 10.2 mg/dL (ref 8.9–10.3)
Chloride: 97 mmol/L — ABNORMAL LOW (ref 98–111)
Creatinine, Ser: 9.96 mg/dL — ABNORMAL HIGH (ref 0.61–1.24)
GFR, Estimated: 6 mL/min — ABNORMAL LOW (ref >60)
Glucose, Bld: 85 mg/dL (ref 70–99)
Phosphorus: 7.8 mg/dL — ABNORMAL HIGH (ref 2.5–4.6)
Potassium: 4.6 mmol/L (ref 3.5–5.1)
Sodium: 138 mmol/L (ref 135–145)

## 2021-08-28 LAB — BLOOD GAS, ARTERIAL
Acid-base deficit: 2.9 mmol/L — ABNORMAL HIGH (ref 0.0–2.0)
Bicarbonate: 23.6 mmol/L (ref 20.0–28.0)
FIO2: 0.28
O2 Saturation: 98.4 %
Patient temperature: 37
pCO2 arterial: 48 mmHg (ref 32.0–48.0)
pH, Arterial: 7.3 — ABNORMAL LOW (ref 7.350–7.450)
pO2, Arterial: 124 mmHg — ABNORMAL HIGH (ref 83.0–108.0)

## 2021-08-28 LAB — CBC
HCT: 31.1 % — ABNORMAL LOW (ref 39.0–52.0)
Hemoglobin: 9.8 g/dL — ABNORMAL LOW (ref 13.0–17.0)
MCH: 31.5 pg (ref 26.0–34.0)
MCHC: 31.5 g/dL (ref 30.0–36.0)
MCV: 100 fL (ref 80.0–100.0)
Platelets: 137 10*3/uL — ABNORMAL LOW (ref 150–400)
RBC: 3.11 MIL/uL — ABNORMAL LOW (ref 4.22–5.81)
RDW: 22.5 % — ABNORMAL HIGH (ref 11.5–15.5)
WBC: 10.6 10*3/uL — ABNORMAL HIGH (ref 4.0–10.5)
nRBC: 1.1 % — ABNORMAL HIGH (ref 0.0–0.2)

## 2021-08-28 NOTE — Procedures (Signed)
Patient Name: [name] [name]  MRN: [id number]  Epilepsy Attending: Lora Havens  Referring Physician/Provider: Dr Kathie Dike Date: 08/28/2021 Duration: 29 mins  Patient history: 60 yo M with ams. EEG to evaluate for seizure  Level of alertness: Awake  AEDs during EEG study: None  Technical aspects: This EEG study was done with scalp electrodes positioned according to the 10-20 International system of electrode placement. Electrical activity was acquired at a sampling rate of 500Hz  and reviewed with a high frequency filter of 70Hz  and a low frequency filter of 1Hz . EEG data were recorded continuously and digitally stored.   Description: No posterior dominant rhythm was seen. EEG showed continuous generalized and lateralized right hemisphere 3 to 6 Hz theta-delta slowing. Physiologic photic driving was not seen during photic stimulation.  Hyperventilation was not performed.     ABNORMALITY - Continuous slow, generalized and lateralized right hemisphere   IMPRESSION: This study is  suggestive of uggestive of cortical dysfunction arising from right hemisphere, nonspecific etiology but secondary to underlying structural abnormality. There is also moderat diffuse encephalopathy, nonspecific etiology. No seizures or epileptiform discharges were seen throughout the recording.  Hortencia Martire Barbra Sarks

## 2021-08-28 NOTE — Care Management Important Message (Signed)
Important Message  Patient Details  Name: [name] [name] MRN: [id number] Date of Birth: [birth year]   Medicare Important Message Given:  Yes     Dannette Barbara 08/28/2021, 12:17 PM

## 2021-08-28 NOTE — Progress Notes (Signed)
PT Cancellation Note  Patient Details Name: [name] [name] MRN: [id number] DOB: 09-04-1961   Cancelled Treatment:    Reason Eval/Treat Not Completed: Patient at procedure or test/unavailable Attempted to see patient, however RN reporting he is off the floor for procedure/testing. Will attempt when patient returns to floor if time permits.  Andrey Campanile, SPT    Andrey Campanile 08/28/2021, 11:35 AM

## 2021-08-28 NOTE — Progress Notes (Addendum)
[facility]  ROUNDING NOTE   Subjective:  Patient resting comfortable in bed Alert but withdrawn Small amount eaten from breakfast tray Patient seen later sitting in recliner in room  Dialysis scheduled for tomorrow   Objective:  Vital signs in last 24 hours:  Temp:  [97.5 F (36.4 C)-98.7 F (37.1 C)] 97.6 F (36.4 C) (09/19 0740) Pulse Rate:  [79-100] 90 (09/19 0740) Resp:  [15-18] 17 (09/19 0740) BP: (131-143)/(61-77) 131/72 (09/19 0740) SpO2:  [94 %-100 %] 100 % (09/19 0740)  Weight change:  Filed Weights   08/08/21 0625 08/16/21 0754  Weight: 91 kg 91 kg    Intake/Output: No intake/output data recorded.   Intake/Output this shift:  No intake/output data recorded.  Physical Exam: General: Sitting in chair, withdrawn  Head: Normocephalic,atraumatic  Eyes: Anicteric  Lungs Lungs clear to auscultation bilaterally  Heart: Regular rate and rhythm  Abdomen:  Soft, nontender, nondistended  Extremities: No peripheral edema  Neurologic: Confused, agitated, drowsy, unable to answer questions   Skin: No acute lesions or rashes  Access: Rt Herograft    Basic Metabolic Panel: Recent Labs  Lab 08/23/21 0606 08/24/21 0503 08/25/21 0605 08/26/21 0525 08/28/21 0437  NA 139 139 137 138 138  K 5.8* 4.8 4.8 3.8 4.6  CL 97* 95* 98 97* 97*  CO2 25 28 28 29 23   GLUCOSE 57* 81 85 120* 85  BUN 63* 55* 73* 50* 71*  CREATININE 9.01* 7.13* 8.44* 6.84* 9.96*  CALCIUM 10.3 10.0 9.8 9.7 10.2  PHOS  --  8.0*  --   --  7.8*     Liver Function Tests: Recent Labs  Lab 08/24/21 0503 08/26/21 0525 08/28/21 0437  AST  --  30  --   ALT  --  19  --   ALKPHOS  --  72  --   BILITOT  --  0.8  --   PROT  --  7.2  --   ALBUMIN 2.5* 2.5* 2.7*     No results for input(s): LIPASE, AMYLASE in the last 168 hours. Recent Labs  Lab 08/25/21 1528  AMMONIA 14     CBC: Recent Labs  Lab 08/23/21 0606 08/24/21 0503 08/25/21 0605 08/26/21 0525 08/28/21 0437   WBC 17.2* 12.5* 11.5* 9.7 10.6*  NEUTROABS 15.0*  --   --   --   --   HGB 10.2* 9.7* 9.3* 9.4* 9.8*  HCT 33.3* 30.1* 28.8* 30.1* 31.1*  MCV 98.2 98.4 96.6 99.0 100.0  PLT 240 253 234 190 137*     Cardiac Enzymes: No results for input(s): CKTOTAL, CKMB, CKMBINDEX, TROPONINI in the last 168 hours.  BNP: Invalid input(s): POCBNP  CBG: No results for input(s): GLUCAP in the last 168 hours.   Microbiology: Results for orders placed or performed during the hospital encounter of 08/08/21  Resp Panel by RT-PCR (Flu A&B, Covid) Nasopharyngeal Swab     Status: None   Collection Time: 08/08/21  1:22 PM   Specimen: Nasopharyngeal Swab; Nasopharyngeal(NP) swabs in vial transport medium  Result Value Ref Range Status   SARS Coronavirus 2 by RT PCR NEGATIVE NEGATIVE Final    Comment: (NOTE) SARS-CoV-2 target nucleic acids are NOT DETECTED.  The SARS-CoV-2 RNA is generally detectable in upper respiratory specimens during the acute phase of infection. The lowest concentration of SARS-CoV-2 viral copies this assay can detect is 138 copies/mL. A negative result does not preclude SARS-Cov-2 infection and should not be used as the sole basis for treatment  or other patient management decisions. A negative result may occur with  improper specimen collection/handling, submission of specimen other than nasopharyngeal swab, presence of viral mutation(s) within the areas targeted by this assay, and inadequate number of viral copies(<138 copies/mL). A negative result must be combined with clinical observations, patient history, and epidemiological information. The expected result is Negative.  Fact Sheet for Patients:  EntrepreneurPulse.com.au  Fact Sheet for Healthcare Providers:  IncredibleEmployment.be  This test is no t yet approved or cleared by the [place] FDA and  has been authorized for detection and/or diagnosis of SARS-CoV-2 by FDA under an  Emergency Use Authorization (EUA). This EUA will remain  in effect (meaning this test can be used) for the duration of the COVID-19 declaration under Section 564(b)(1) of the Act, 21 U.S.C.section 360bbb-3(b)(1), unless the authorization is terminated  or revoked sooner.       Influenza A by PCR NEGATIVE NEGATIVE Final   Influenza B by PCR NEGATIVE NEGATIVE Final    Comment: (NOTE) The Xpert Xpress SARS-CoV-2/FLU/RSV plus assay is intended as an aid in the diagnosis of influenza from Nasopharyngeal swab specimens and should not be used as a sole basis for treatment. Nasal washings and aspirates are unacceptable for Xpert Xpress SARS-CoV-2/FLU/RSV testing.  Fact Sheet for Patients: EntrepreneurPulse.com.au  Fact Sheet for Healthcare Providers: IncredibleEmployment.be  This test is not yet approved or cleared by the [place] FDA and has been authorized for detection and/or diagnosis of SARS-CoV-2 by FDA under an Emergency Use Authorization (EUA). This EUA will remain in effect (meaning this test can be used) for the duration of the COVID-19 declaration under Section 564(b)(1) of the Act, 21 U.S.C. section 360bbb-3(b)(1), unless the authorization is terminated or revoked.  Performed at [facility], [address] [place], [place] [address]   Culture, blood (Routine X 2) w Reflex to ID Panel     Status: Abnormal   Collection Time: 08/11/21  5:47 PM   Specimen: BLOOD  Result Value Ref Range Status   Specimen Description   Final    BLOOD LEFT HAND Performed at [facility], [address], [place], [place] [address]    Special Requests   Final    BOTTLES DRAWN AEROBIC AND ANAEROBIC Blood Culture adequate volume Performed at [facility], [address] [place], [place] [address]    Culture  Setup Time   Final    GRAM POSITIVE COCCI IN CHAINS IN BOTH AEROBIC AND ANAEROBIC BOTTLES Gram Stain Report Called  to,Read Back By and Verified With: JASON ROBBINS 08/12/21 @ 0507 BY SB Performed at [facility], [address] [place], [place] [address]    Culture (A)  Final    ENTEROCOCCUS FAECALIS SUSCEPTIBILITIES PERFORMED ON PREVIOUS CULTURE WITHIN THE LAST 5 DAYS. Performed at [facility], [place] [address] [place], [place] [address]    Report Status 08/14/2021 FINAL  Final  Culture, blood (Routine X 2) w Reflex to ID Panel     Status: Abnormal   Collection Time: 08/11/21  5:47 PM   Specimen: BLOOD  Result Value Ref Range Status   Specimen Description   Final    BLOOD FOA Performed at [facility], [address] [place], [place] [address]    Special Requests   Final    BOTTLES DRAWN AEROBIC AND ANAEROBIC Blood Culture adequate volume Performed at [facility], [address], [place], [place] [address]    Culture  Setup Time  Final    GRAM POSITIVE COCCI IN CHAINS IN BOTH AEROBIC AND ANAEROBIC BOTTLES Gram Stain Report Called to,Read Back By and Verified With: JASON ROBBINS 08/12/21 @ 0507 BY SB Performed at [facility], [place] [address] [place], [place] [address]    Culture ENTEROCOCCUS FAECALIS (A)  Final   Report Status 08/14/2021 FINAL  Final   Organism ID, Bacteria ENTEROCOCCUS FAECALIS  Final      Susceptibility   Enterococcus faecalis - MIC*    AMPICILLIN <=2 SENSITIVE Sensitive     VANCOMYCIN 2 SENSITIVE Sensitive     GENTAMICIN SYNERGY SENSITIVE Sensitive     * ENTEROCOCCUS FAECALIS  Blood Culture ID Panel (Reflexed)     Status: Abnormal   Collection Time: 08/11/21  5:47 PM  Result Value Ref Range Status   Enterococcus faecalis DETECTED (A) NOT DETECTED Final    Comment: CRITICAL RESULT CALLED TO, READ BACK BY AND VERIFIED WITH: JASON ROBBINS 08/12/21 @ 0507 BY SB    Enterococcus Faecium NOT DETECTED NOT DETECTED Final   Listeria monocytogenes NOT DETECTED NOT DETECTED Final   Staphylococcus species NOT DETECTED NOT DETECTED Final    Staphylococcus aureus (BCID) NOT DETECTED NOT DETECTED Final   Staphylococcus epidermidis NOT DETECTED NOT DETECTED Final   Staphylococcus lugdunensis NOT DETECTED NOT DETECTED Final   Streptococcus species NOT DETECTED NOT DETECTED Final   Streptococcus agalactiae NOT DETECTED NOT DETECTED Final   Streptococcus pneumoniae NOT DETECTED NOT DETECTED Final   Streptococcus pyogenes NOT DETECTED NOT DETECTED Final   A.calcoaceticus-baumannii NOT DETECTED NOT DETECTED Final   Bacteroides fragilis NOT DETECTED NOT DETECTED Final   Enterobacterales NOT DETECTED NOT DETECTED Final   Enterobacter cloacae complex NOT DETECTED NOT DETECTED Final   Escherichia coli NOT DETECTED NOT DETECTED Final   Klebsiella aerogenes NOT DETECTED NOT DETECTED Final   Klebsiella oxytoca NOT DETECTED NOT DETECTED Final   Klebsiella pneumoniae NOT DETECTED NOT DETECTED Final   Proteus species NOT DETECTED NOT DETECTED Final   Salmonella species NOT DETECTED NOT DETECTED Final   Serratia marcescens NOT DETECTED NOT DETECTED Final   Haemophilus influenzae NOT DETECTED NOT DETECTED Final   Neisseria meningitidis NOT DETECTED NOT DETECTED Final   Pseudomonas aeruginosa NOT DETECTED NOT DETECTED Final   Stenotrophomonas maltophilia NOT DETECTED NOT DETECTED Final   Candida albicans NOT DETECTED NOT DETECTED Final   Candida auris NOT DETECTED NOT DETECTED Final   Candida glabrata NOT DETECTED NOT DETECTED Final   Candida krusei NOT DETECTED NOT DETECTED Final   Candida parapsilosis NOT DETECTED NOT DETECTED Final   Candida tropicalis NOT DETECTED NOT DETECTED Final   Cryptococcus neoformans/gattii NOT DETECTED NOT DETECTED Final   Vancomycin resistance NOT DETECTED NOT DETECTED Final    Comment: Performed at [facility], [address] [place], [place] [address]  CULTURE, BLOOD (ROUTINE X 2) w Reflex to ID Panel     Status: None   Collection Time: 08/12/21 11:58 PM   Specimen: BLOOD  Result Value Ref  Range Status   Specimen Description BLOOD BLOOD LEFT HAND  Final   Special Requests   Final    BOTTLES DRAWN AEROBIC AND ANAEROBIC Blood Culture adequate volume   Culture   Final    NO GROWTH 5 DAYS Performed at [facility] [place] [facility], [address] [place], [place] [address]    Report Status 08/18/2021 FINAL  Final  CULTURE, BLOOD (ROUTINE X 2) w Reflex to ID Panel     Status: None  Collection Time: 08/12/21 11:58 PM   Specimen: BLOOD  Result Value Ref Range Status   Specimen Description BLOOD LEFT RADIAL  Final   Special Requests   Final    BOTTLES DRAWN AEROBIC AND ANAEROBIC Blood Culture results may not be optimal due to an inadequate volume of blood received in culture bottles   Culture   Final    NO GROWTH 5 DAYS Performed at [facility], [address] [place], [place] [address]    Report Status 08/18/2021 FINAL  Final  Resp Panel by RT-PCR (Flu A&B, Covid) Nasopharyngeal Swab     Status: None   Collection Time: 08/21/21  9:50 AM   Specimen: Nasopharyngeal Swab; Nasopharyngeal(NP) swabs in vial transport medium  Result Value Ref Range Status   SARS Coronavirus 2 by RT PCR NEGATIVE NEGATIVE Final    Comment: (NOTE) SARS-CoV-2 target nucleic acids are NOT DETECTED.  The SARS-CoV-2 RNA is generally detectable in upper respiratory specimens during the acute phase of infection. The lowest concentration of SARS-CoV-2 viral copies this assay can detect is 138 copies/mL. A negative result does not preclude SARS-Cov-2 infection and should not be used as the sole basis for treatment or other patient management decisions. A negative result may occur with  improper specimen collection/handling, submission of specimen other than nasopharyngeal swab, presence of viral mutation(s) within the areas targeted by this assay, and inadequate number of viral copies(<138 copies/mL). A negative result must be combined with clinical observations, patient history, and  epidemiological information. The expected result is Negative.  Fact Sheet for Patients:  EntrepreneurPulse.com.au  Fact Sheet for Healthcare Providers:  IncredibleEmployment.be  This test is no t yet approved or cleared by the [place] FDA and  has been authorized for detection and/or diagnosis of SARS-CoV-2 by FDA under an Emergency Use Authorization (EUA). This EUA will remain  in effect (meaning this test can be used) for the duration of the COVID-19 declaration under Section 564(b)(1) of the Act, 21 U.S.C.section 360bbb-3(b)(1), unless the authorization is terminated  or revoked sooner.       Influenza A by PCR NEGATIVE NEGATIVE Final   Influenza B by PCR NEGATIVE NEGATIVE Final    Comment: (NOTE) The Xpert Xpress SARS-CoV-2/FLU/RSV plus assay is intended as an aid in the diagnosis of influenza from Nasopharyngeal swab specimens and should not be used as a sole basis for treatment. Nasal washings and aspirates are unacceptable for Xpert Xpress SARS-CoV-2/FLU/RSV testing.  Fact Sheet for Patients: EntrepreneurPulse.com.au  Fact Sheet for Healthcare Providers: IncredibleEmployment.be  This test is not yet approved or cleared by the [place] FDA and has been authorized for detection and/or diagnosis of SARS-CoV-2 by FDA under an Emergency Use Authorization (EUA). This EUA will remain in effect (meaning this test can be used) for the duration of the COVID-19 declaration under Section 564(b)(1) of the Act, 21 U.S.C. section 360bbb-3(b)(1), unless the authorization is terminated or revoked.  Performed at [facility], [address] [place], [place] [address]     Coagulation Studies: No results for input(s): LABPROT, INR in the last 72 hours.  Urinalysis: No results for input(s): COLORURINE, LABSPEC, PHURINE, GLUCOSEU, HGBUR, BILIRUBINUR, KETONESUR, PROTEINUR, UROBILINOGEN,  NITRITE, LEUKOCYTESUR in the last 72 hours.  Invalid input(s): APPERANCEUR    Imaging: MR BRAIN WO CONTRAST  Result Date: 08/26/2021 CLINICAL DATA:  Altered mental status, confusion EXAM: MRI HEAD WITHOUT CONTRAST TECHNIQUE: Multiplanar, multiecho pulse sequences of the brain and surrounding structures were obtained without intravenous contrast. COMPARISON:  Noncontrast CT head 1 day prior FINDINGS: Brain: There is a punctate focus of diffusion restriction in the left posterior temporal lobe cortex with appreciable low ADC signal. A similar more ill-defined focus of elevated DWI signal with apparent low ADC signal is seen in the right insular cortex. Additional ill-defined foci of elevated DWI signal in the bilateral corona radiata are seen without corresponding low ADC signal favored to reflect T2 shine through artifact. There is no evidence of large vessel territorial infarct. There is no evidence of acute intracranial hemorrhage. The ventricles are not enlarged. Scattered foci of FLAIR signal abnormality in the subcortical and periventricular white matter are nonspecific but likely reflect sequela of mild chronic white matter microangiopathy. There is no mass lesion.  There is no midline shift. Vascular: Normal flow voids. Skull and upper cervical spine: Marrow signal in the upper cervical spine is diffusely abnormal, as seen on prior thoracolumbar spine MRIs, nonspecific. Sinuses/Orbits: The paranasal sinuses are clear. The globes and orbits are unremarkable. Other: None. IMPRESSION: 1. Probable punctate acute infarcts in the posterior left temporal lobe and right insular cortex. Differential includes T2 shine through artifact. 2. Mild chronic white matter microangiopathy. Electronically Signed   By: Valetta Mole M.D.   On: 08/26/2021 12:34   US Carotid Bilateral  Result Date: 08/27/2021 CLINICAL DATA:  Stroke. EXAM: BILATERAL CAROTID DUPLEX ULTRASOUND TECHNIQUE: Pearline Cables scale imaging, color Doppler and  duplex ultrasound were performed of bilateral carotid and vertebral arteries in the neck. COMPARISON:  None. FINDINGS: Criteria: Quantification of carotid stenosis is based on velocity parameters that correlate the residual internal carotid diameter with NASCET-based stenosis levels, using the diameter of the distal internal carotid lumen as the denominator for stenosis measurement. The following velocity measurements were obtained: RIGHT ICA: 55/8 cm/sec CCA: 87/5 cm/sec SYSTOLIC ICA/CCA RATIO:  0.7 ECA: 29 cm/sec LEFT ICA: 52/13 cm/sec CCA: 64/3 cm/sec SYSTOLIC ICA/CCA RATIO:  1.1 ECA: 65 cm/sec RIGHT CAROTID ARTERY: Right carotid arteries are patent without significant stenosis. Normal waveforms and velocities in the internal carotid artery. External carotid artery is patent with normal waveform. RIGHT VERTEBRAL ARTERY: Antegrade flow and normal waveform in the right vertebral artery. LEFT CAROTID ARTERY: Small amount of echogenic plaque at the left carotid bulb. Normal waveforms and velocities in the internal carotid artery. External carotid artery is patent with normal waveform. LEFT VERTEBRAL ARTERY: Antegrade flow and normal waveform in the left vertebral artery. Other: Right internal jugular vein is small and echogenic and suggestive for chronic occlusion. 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 IMPRESSION: 1. Right carotid arteries are patent without significant plaque or stenosis. 2. Small amount of plaque at the left carotid bulb. Estimated degree of stenosis in left internal carotid artery is less than 50%. 3. Patent vertebral arteries bilaterally. 4. 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 Electronically Signed   By: Markus Daft M.D.   On: 08/27/2021 08:39     Medications:    sodium chloride 250 mL (08/27/21 2052)   ampicillin (OMNIPEN) IV 2 g (08/27/21 2114)   cefTRIAXone (ROCEPHIN)  IV  2 g (08/27/21 2153)     apixaban  2.5 mg Oral BID   Chlorhexidine Gluconate Cloth  6 each Topical Q0600   epoetin (EPOGEN/PROCRIT) injection  4,000 Units Intravenous Q M,W,F-HD   feeding supplement (NEPRO CARB STEADY)  237 mL Oral TID BM   lidocaine  2 patch Transdermal Q24H  nicotine  21 mg Transdermal Daily   senna-docusate  1 tablet Oral BID   sevelamer carbonate  1,600 mg Oral TID WC   sodium chloride, acetaminophen, bisacodyl, hydrALAZINE, LORazepam, methocarbamol, Muscle Rub, ondansetron (ZOFRAN) IV, oxyCODONE-acetaminophen, polyethylene glycol, sodium chloride flush  Assessment/ Plan:  [name] is a 60 y.o. black male with end stage renal disease on hemodialysis, hypertension, congestive heart failure, substance abuse who is admitted to [facility] on 08/08/2021 for Lower back pain [M54.50] Acute midline low back pain without sciatica [M54.50] At risk for inadequate pain control [Z91.89] Back pain [M54.9] Found to have eneterococcus faecalis bacteremia and osteomyelitis of C5-C6.   [facility] Right AVG Hero 90.5kg  #ESRD on HD TTS Due to patient being placed in rehab in [place], he will receive dialysis at [facility] on a TTS schedule. We will begin to dialyze patient on this schedule to prepare for discharge. Scheduled to receive dialysis tomorrow in a chair   #Hypotension BP 131/72 this morning  #Anemia of CKD Lab Results  Component Value Date   HGB 9.8 (L) 08/28/2021   Will continue Low dose Epogen with HD treatments  #Secondary Hyperparathyroidism Lab Results  Component Value Date   PTH 583.2 (H) 12/11/2012   CALCIUM 10.2 08/28/2021   CAION 1.27 (H) 12/26/2012   PHOS 7.8 (H) 08/28/2021   Calcium at goal. Phosphorus elevated. Continue sevelamer with meals  #Osteomyelitis: C5-C6  and L4-L5  Continue management per primary team.  #Altered Mental Status Patient more alert, but appears withdrawn Delirium precautions in place    LOS: [place] 9/19/202210:16 AM

## 2021-08-28 NOTE — Progress Notes (Signed)
Physical Therapy Treatment Patient Details Name: [name] [name] MRN: [id number] DOB: [birth year] Today's Date: 08/28/2021   History of Present Illness Pt is a 60 y.o. male with medical history significant of ESRD-HD (MWF), hypertension, tobacco abuse, polysubstance abuse, diverticulitis, anemia, jugular vein thrombosis, GI bleeding, right pneumothorax, who presents with lower back pain. 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    PT Comments    Pt received seated in recliner upon arrival to room.  Pt agreeable to therapy.  Nursing staff requesting assistance for pt to get back into bed for test/procedure.  Pt agreeable to getting back into bed.  Pt with notable coldness in all extremities.  Pt is able to follow commands with proper hand placement, however has difficulty with keeping knees locked into extension.  Pt is able to assist in moving to the bed, however does not have good safety awareness for safe transfer without assistance from therapist/nursing staff.  Assisted pt with transfer of LE's into the bed.  Pt left in bed with all needs met and nursing staff in room.  Current discharge plans to SNF remain appropriate at this time.  Pt will continue to benefit from skilled therapy in order to address deficits listed below.     Recommendations for follow up therapy are one component of a multi-disciplinary discharge planning process, led by the attending physician.  Recommendations may be updated based on patient status, additional functional criteria and insurance authorization.  Follow Up Recommendations  SNF     Equipment Recommendations  Rolling walker with 5" wheels;3in1 (PT)    Recommendations for Other Services        Precautions / Restrictions Precautions Precautions: Fall Precaution Comments: R UE AV fistula     Mobility  Bed Mobility                    Transfers Overall transfer level: Needs assistance Equipment used: Rolling walker (2 wheeled)   Sit to Stand: Min assist;+2 safety/equipment Stand pivot transfers: Mod assist;+2 physical assistance          Ambulation/Gait Ambulation/Gait assistance: Min assist;+2 safety/equipment Gait Distance (Feet): 5 Feet Assistive device: Rolling walker (2 wheeled) Gait Pattern/deviations: Step-through pattern;Decreased step length - right;Decreased step length - left;Trunk flexed Gait velocity: decreased   General Gait Details: pt was able to take ~ 5 steps to bed. vcs throughout for safety. pt have slight knee buckling however able to correct without intervention. weakness noted with all mobility, transfers, and gait   Stairs             Wheelchair Mobility    Modified Rankin (Stroke Patients Only)       Balance Overall balance assessment: Needs assistance Sitting-balance support: No upper extremity supported;Feet supported Sitting balance-Leahy Scale: Good Sitting balance - Comments: pt sat EOB with supervision without LOB   Standing balance support: Bilateral upper extremity supported;During functional activity Standing balance-Leahy Scale: Poor Standing balance comment: pt is high fall risk 2/2 to weakness. knees buckling at times however easily able to prevent falling without intervention                            Cognition Arousal/Alertness: Awake/alert Behavior During Therapy: Flat affect Overall  Cognitive Status: No family/caregiver present to determine baseline cognitive functioning                                 General Comments: Pt is not responsive for most questions asked, but is able to tell therapist if he is cold/hot.      Exercises      General Comments         Pertinent Vitals/Pain Pain Assessment: Faces Faces Pain Scale: Hurts whole lot Pain Location: Unable to discuss Pain Descriptors / Indicators: Aching;Cramping;Grimacing;Guarding Pain Intervention(s): Limited activity within patient's tolerance;Monitored during session;Repositioned    Home Living                      Prior Function            PT Goals (current goals can now be found in the care plan section) Acute Rehab PT Goals Patient Stated Goal: to have less pain PT Goal Formulation: With patient Time For Goal Achievement: 2021/09/21 Potential to Achieve Goals: Fair Progress towards PT goals: Progressing toward goals    Frequency    Min 2X/week      PT Plan Current plan remains appropriate    Co-evaluation              AM-PAC PT "6 Clicks" Mobility   Outcome Measure  Help needed turning from your back to your side while in a flat bed without using bedrails?: A Lot Help needed moving from lying on your back to sitting on the side of a flat bed without using bedrails?: A Lot Help needed moving to and from a bed to a chair (including a wheelchair)?: A Lot Help needed standing up from a chair using your arms (e.g., wheelchair or bedside chair)?: A Lot Help needed to walk in hospital room?: A Lot Help needed climbing 3-5 steps with a railing? : A Lot 6 Click Score: 12    End of Session   Activity Tolerance: Patient tolerated treatment well;Patient limited by fatigue Patient left: in bed;with call bell/phone within reach;with bed alarm set;with nursing/sitter in room Nurse Communication: Mobility status PT Visit Diagnosis: Unsteadiness on feet (R26.81);Difficulty in walking, not elsewhere classified (R26.2);Muscle weakness (generalized) (M62.81);Pain;Other abnormalities of gait and mobility (R26.89) Pain - part of body:  (Back)     Time: 8185-9093 PT Time Calculation (min) (ACUTE ONLY): 9 min  Charges:  $Therapeutic Activity: 8-22 mins                      [name] [name], PT, DPT 08/28/21, 1:18 PM    [name] [name] 08/28/2021, 1:13 PM

## 2021-08-28 NOTE — Progress Notes (Signed)
Eeg done 

## 2021-08-28 NOTE — Progress Notes (Signed)
   08/28/21 1346  Clinical Encounter Type  Visited With Patient  Visit Type Initial;Spiritual support  Referral From Nurse  Consult/Referral To Chaplain  Spiritual Encounters  Spiritual Needs Emotional;Prayer  Responded to a page room 144A. Pt appears to be disoriented and not capable of comprehended. Pt kept crying out loud asking for him mother. I provided a ministry of presence and spiritual support. Pt allowed me to pray with him before I ended the visit.

## 2021-08-28 NOTE — Progress Notes (Signed)
OT Cancellation Note  Patient Details Name: [name] [name] MRN: [id number] DOB: May 06, 1961   Cancelled Treatment:    Reason Eval/Treat Not Completed: Fatigue/lethargy limiting ability to participate. Pt received in bed, sleeping soundly. Had difficulty waking, declined OT services at this time. Will attempt at a later time/date, as pt is available, willing, and medically appropriate.  Josiah Lobo, PhD, MS, OTR/L 08/28/21, 4:00 PM

## 2021-08-28 NOTE — Progress Notes (Signed)
AtivanPatient ID: [name], male   DOB: [birth year], 60 y.o.   MRN: [id number]  PROGRESS NOTE    MORY HERRMAN  DUK:[id number] DOB: 1961/06/13 DOA: 08/08/2021 PCP: Pcp, No   Brief Narrative:  60 y.o. male with medical history significant of ESRD-HD, hypertension, tobacco abuse, polysubstance abuse, diverticulitis, anemia, jugular vein thrombosis, GI bleeding, right pneumothorax presented with worsening lower back pain.  He was found to have sepsis secondary to Enterococcus faecalis bacteremia.  ID was consulted and he has been on ampicillin and ceftriaxone.  He was found to have possible developing osteomyelitis in C5-C6 with possible early left psoas abscess formation and possible early discitis in L4-L5.  Nephrology was also consulted for continuation of dialysis.  TEE showed aortic valve endocarditis.  PT recommended SNF placement.  Plans were for SNF discharge with outpatient antibiotics, but patient has had waxing and waning mental status.  He has had periods of lethargy and agitation.  This does not appear to be his baseline.  MRI brain was performed that indicated possible acute infarcts.  Neurology consulted.  Assessment & Plan:   Sepsis: Possibly evolving on admission Enterococcus faecalis bacteremia Aortic valve endocarditis -ID following.  Currently on Rocephin and ampicillin. -TTE negative for vegetations. 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  ID is recommending 6 to weeks of ampicillin and Rocephin till 09/23/2021 followed by p.o. amoxicillin for a few weeks.  -Status post tunneled catheter placement on 08/18/2021 by IR -Blood cultures from 08/11/2021 grew E faecalis.  Blood cultures from 08/12/2021 have been negative so far. -Sepsis has resolved.  Currently hemodynamically stable with no temperature spikes.  Severe lower back pain Possible developing osteomyelitis in C5-C6 Left psoas muscle infection with superimposed 1.3 cm phlegmon/early abscess formation Possible early  discitis in L4-L5 -Continue antibiotics as above.  Continue pain management and bowel regimen   End-stage renal disease on hemodialysis -Nephrology following.  Dialysis as per nephrology schedule -Dialysis schedule changed from M WF to TTS  Rectal bleeding -Noted to have bright red blood with bowel movement t on 9/16 no further blood with further bowel movements since then -Suspect this may be related to hemorrhoids -Hemoglobin has been stable -If he continues to have bleeding, may need to have GI involvement. -Eliquis was briefly put on hold, but was resumed when he did not have any further bleeding  Acute encephalopathy -Mental status has appeared to progressively get worse through his hospitalization -CT head was unremarkable, no clear metabolic cause -MRI brain confirms acute infarct -EEG is also been ordered -Neurology consulted, appreciate input -Encephalopathy possibly related to underlying infectious process versus hospital delirium -We will use Ativan for agitation as needed  Acute CVA -MRI notes probable punctate acute infarcts in the posterior left temporal lobe and right insular cortex -Neurology consulted, appreciate input  Anemia of chronic disease -From renal failure.  Hemoglobin stable.  Monitor intermittently.  Polysubstance abuse and tobacco abuse -Patient was counseled regarding cessation by prior hospitalist.  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 Keturah Barre recommendations.    Hypertension Hypotension -Blood pressure currently stable.  Off amlodipine  Chronic combined CHF -LVEF of 30 to 35% as per TTE on 08/13/2021.  Volume managed by dialysis.  Strict input output.  Daily weights.  Fluid restriction  Depression -Patient's mother reports that patient has been depressed for the past 2 years since the loss of  his child -Will need outpatient  psychiatry follow-up  Generalized conditioning -PT/OT recommend SNF placement.  Social worker following.  Goals of care -Patient has had a prolonged hospitalization and has multiple underlying medical conditions -He appears to have a persistent encephalopathy, and is now refusing to eat and drink -He has been treated for bacteremia with endocarditis and discitis -With his underlying end-stage renal disease and persistent encephalopathy, his overall prognosis does appear to be poor -I discussed CODE STATUS with patient's parents and they were clear that patient would not want to receive correct measures at the end of his life and agreed to DNR.  His mother was a previous volunteer at hospice for many years and understands DNR status.  She also recognizes that with his refusal to eat/drink, his prognosis is not good.  They are agreeable to meet with palliative care to discuss further goals.  DVT prophylaxis: Eliquis Code Status: DNR, confirmed with patient's parents Family Communication: Updated patient's mother and father over the phone Disposition Plan: Status is: Inpatient  Remains inpatient appropriate because:Inpatient level of care appropriate due to severity of illness  Dispo: The patient is from: Home              Anticipated d/c is to: SNF              Patient currently is medically stable to d/c.    Difficult to place patient No  Consultants: ID/cardiology/nephrology/neurology/palliative care  Procedures: TTE/TEE Tunneled catheter placement by IR on 08/18/2021  Antimicrobials:  Anti-infectives (From admission, onward)    Start     Dose/Rate Route Frequency Ordered Stop   08/21/21 0000  ampicillin IVPB        2 g Intravenous Every 12 hours 08/21/21 0940 09/27/21 2359   08/21/21 0000  cefTRIAXone (ROCEPHIN) IVPB        2 g Intravenous Every 12 hours 08/21/21 0940 09/27/21 2359   08/14/21 1530  cefTRIAXone (ROCEPHIN) 2 g in sodium chloride 0.9 % 100 mL IVPB        2 g 200  mL/hr over 30 Minutes Intravenous Every 12 hours 08/14/21 1443     08/14/21 1200  vancomycin (VANCOCIN) IVPB 1000 mg/200 mL premix  Status:  Discontinued        1,000 mg 200 mL/hr over 60 Minutes Intravenous Every M-W-F (Hemodialysis) 08/11/21 1831 08/12/21 0517   08/12/21 2200  cefTRIAXone (ROCEPHIN) 2 g in sodium chloride 0.9 % 100 mL IVPB  Status:  Discontinued        2 g 200 mL/hr over 30 Minutes Intravenous Every 12 hours 08/12/21 1923 08/14/21 1443   08/12/21 0615  ampicillin (OMNIPEN) 2 g in sodium chloride 0.9 % 100 mL IVPB        2 g 300 mL/hr over 20 Minutes Intravenous Every 12 hours 08/12/21 0520     08/11/21 1930  vancomycin (VANCOREADY) IVPB 2000 mg/400 mL        2,000 mg 200 mL/hr over 120 Minutes Intravenous  Once 08/11/21 1831 08/12/21 0156   08/11/21 1900  cefTRIAXone (ROCEPHIN) 2 g in sodium chloride 0.9 % 100 mL IVPB  Status:  Discontinued        2 g 200 mL/hr over 30 Minutes Intravenous Every 12 hours 08/11/21 1829 08/12/21 0517        Subjective: Patient reported by staff to be increasingly agitated today.  He received Ativan prior to my visit is currently somnolent..  Staff reports that he was spitting out food and  medications.  Objective: Vitals:   08/28/21 0500 08/28/21 0740 08/28/21 1714 08/28/21 1857  BP: 139/77 131/72 (!) 131/59   Pulse: 99 90 87 81  Resp: 18 17 15    Temp: 98.7 F (37.1 C) 97.6 F (36.4 C) (!) 96.2 F (35.7 C)   TempSrc: Oral  Axillary   SpO2: 100% 100% (!) 69% 100%  Weight:      Height:        Intake/Output Summary (Last 24 hours) at 08/28/2021 2036 Last data filed at 08/28/2021 1020 Gross per 24 hour  Intake 0 ml  Output --  Net 0 ml    Filed Weights   08/08/21 0625 08/16/21 0754  Weight: 91 kg 91 kg    Examination:  General exam: somnolent Respiratory system: Clear to auscultation. Respiratory effort normal. Cardiovascular system:RRR. No murmurs, rubs, gallops. Gastrointestinal system: Abdomen is nondistended,  soft and nontender. No organomegaly or masses felt. Normal bowel sounds heard. Central nervous system: unable to participate in exam Extremities: No C/C/E, +pedal pulses Skin: No rashes, lesions or ulcers Psychiatry: somnolent   Data Reviewed: I have personally reviewed following labs and imaging studies  CBC: Recent Labs  Lab 08/23/21 0606 08/24/21 0503 08/25/21 0605 08/26/21 0525 08/28/21 0437  WBC 17.2* 12.5* 11.5* 9.7 10.6*  NEUTROABS 15.0*  --   --   --   --   HGB 10.2* 9.7* 9.3* 9.4* 9.8*  HCT 33.3* 30.1* 28.8* 30.1* 31.1*  MCV 98.2 98.4 96.6 99.0 100.0  PLT 240 253 234 190 546*   Basic Metabolic Panel: Recent Labs  Lab 08/23/21 0606 08/24/21 0503 08/25/21 0605 08/26/21 0525 08/28/21 0437  NA 139 139 137 138 138  K 5.8* 4.8 4.8 3.8 4.6  CL 97* 95* 98 97* 97*  CO2 25 28 28 29 23   GLUCOSE 57* 81 85 120* 85  BUN 63* 55* 73* 50* 71*  CREATININE 9.01* 7.13* 8.44* 6.84* 9.96*  CALCIUM 10.3 10.0 9.8 9.7 10.2  PHOS  --  8.0*  --   --  7.8*   GFR: Estimated Creatinine Clearance: 9.2 mL/min (A) (by C-G formula based on SCr of 9.96 mg/dL (H)). Liver Function Tests: Recent Labs  Lab 08/24/21 0503 08/26/21 0525 08/28/21 0437  AST  --  30  --   ALT  --  19  --   ALKPHOS  --  72  --   BILITOT  --  0.8  --   PROT  --  7.2  --   ALBUMIN 2.5* 2.5* 2.7*   No results for input(s): LIPASE, AMYLASE in the last 168 hours. Recent Labs  Lab 08/25/21 1528  AMMONIA 14   Coagulation Profile: No results for input(s): INR, PROTIME in the last 168 hours. Cardiac Enzymes: No results for input(s): CKTOTAL, CKMB, CKMBINDEX, TROPONINI in the last 168 hours. BNP (last 3 results) No results for input(s): PROBNP in the last 8760 hours. HbA1C: Recent Labs    08/26/21 1710  HGBA1C 4.3*   CBG: No results for input(s): GLUCAP in the last 168 hours.  Lipid Profile: Recent Labs    08/26/21 1710  LDLDIRECT 75.1   Thyroid Function Tests: No results for input(s): TSH,  T4TOTAL, FREET4, T3FREE, THYROIDAB in the last 72 hours. Anemia Panel: No results for input(s): VITAMINB12, FOLATE, FERRITIN, TIBC, IRON, RETICCTPCT in the last 72 hours. Sepsis Labs: No results for input(s): PROCALCITON, LATICACIDVEN in the last 168 hours.   Recent Results (from the past 240 hour(s))  Resp Panel by RT-PCR (  Flu A&B, Covid) Nasopharyngeal Swab     Status: None   Collection Time: 08/21/21  9:50 AM   Specimen: Nasopharyngeal Swab; Nasopharyngeal(NP) swabs in vial transport medium  Result Value Ref Range Status   SARS Coronavirus 2 by RT PCR NEGATIVE NEGATIVE Final    Comment: (NOTE) SARS-CoV-2 target nucleic acids are NOT DETECTED.  The SARS-CoV-2 RNA is generally detectable in upper respiratory specimens during the acute phase of infection. The lowest concentration of SARS-CoV-2 viral copies this assay can detect is 138 copies/mL. A negative result does not preclude SARS-Cov-2 infection and should not be used as the sole basis for treatment or other patient management decisions. A negative result may occur with  improper specimen collection/handling, submission of specimen other than nasopharyngeal swab, presence of viral mutation(s) within the areas targeted by this assay, and inadequate number of viral copies(<138 copies/mL). A negative result must be combined with clinical observations, patient history, and epidemiological information. The expected result is Negative.  Fact Sheet for Patients:  EntrepreneurPulse.com.au  Fact Sheet for Healthcare Providers:  IncredibleEmployment.be  This test is no t yet approved or cleared by the [place] FDA and  has been authorized for detection and/or diagnosis of SARS-CoV-2 by FDA under an Emergency Use Authorization (EUA). This EUA will remain  in effect (meaning this test can be used) for the duration of the COVID-19 declaration under Section 564(b)(1) of the Act, 21 U.S.C.section  360bbb-3(b)(1), unless the authorization is terminated  or revoked sooner.       Influenza A by PCR NEGATIVE NEGATIVE Final   Influenza B by PCR NEGATIVE NEGATIVE Final    Comment: (NOTE) The Xpert Xpress SARS-CoV-2/FLU/RSV plus assay is intended as an aid in the diagnosis of influenza from Nasopharyngeal swab specimens and should not be used as a sole basis for treatment. Nasal washings and aspirates are unacceptable for Xpert Xpress SARS-CoV-2/FLU/RSV testing.  Fact Sheet for Patients: EntrepreneurPulse.com.au  Fact Sheet for Healthcare Providers: IncredibleEmployment.be  This test is not yet approved or cleared by the [place] FDA and has been authorized for detection and/or diagnosis of SARS-CoV-2 by FDA under an Emergency Use Authorization (EUA). This EUA will remain in effect (meaning this test can be used) for the duration of the COVID-19 declaration under Section 564(b)(1) of the Act, 21 U.S.C. section 360bbb-3(b)(1), unless the authorization is terminated or revoked.  Performed at [facility], [address], [place], [place] [address]           Radiology Studies: EEG adult  Result Date: 09-18-2021 Lora Havens, MD     September 18, 2021  2:38 PM Patient Name: CHRISTOP HIPPERT MRN: [id number] Epilepsy Attending: Lora Havens Referring Physician/Provider: Dr Kathie Dike Date: 09-18-2021 Duration: 29 mins Patient history: 60 yo M with ams. EEG to evaluate for seizure Level of alertness: Awake AEDs during EEG study: None Technical aspects: This EEG study was done with scalp electrodes positioned according to the 10-20 International system of electrode placement. Electrical activity was acquired at a sampling rate of 500Hz  and reviewed with a high frequency filter of 70Hz  and a low frequency filter of 1Hz . EEG data were recorded continuously and digitally stored. Description: No posterior dominant rhythm was seen. EEG  showed continuous generalized and lateralized right hemisphere 3 to 6 Hz theta-delta slowing. Physiologic photic driving was not seen during photic stimulation.  Hyperventilation was not performed.   ABNORMALITY - Continuous slow, generalized and lateralized right hemisphere IMPRESSION: This study is  suggestive of  uggestive of cortical dysfunction arising from right hemisphere, nonspecific etiology but secondary to underlying structural abnormality. There is also moderat diffuse encephalopathy, nonspecific etiology. No seizures or epileptiform discharges were seen throughout the recording. Priyanka Barbra Sarks        Scheduled Meds:  apixaban  2.5 mg Oral BID   Chlorhexidine Gluconate Cloth  6 each Topical Q0600   epoetin (EPOGEN/PROCRIT) injection  4,000 Units Intravenous Q M,W,F-HD   feeding supplement (NEPRO CARB STEADY)  237 mL Oral TID BM   lidocaine  2 patch Transdermal Q24H   nicotine  21 mg Transdermal Daily   senna-docusate  1 tablet Oral BID   sevelamer carbonate  1,600 mg Oral TID WC   Continuous Infusions:  sodium chloride 250 mL (08/27/21 2052)   ampicillin (OMNIPEN) IV 2 g (08/28/21 1107)   cefTRIAXone (ROCEPHIN)  IV 2 g (08/28/21 1249)          Kathie Dike, MD [facility] 08/28/2021, 8:36 PM

## 2021-08-29 ENCOUNTER — Inpatient Hospital Stay: Payer: Medicare Other

## 2021-08-29 ENCOUNTER — Inpatient Hospital Stay: Payer: Medicare Other | Admitting: Radiology

## 2021-08-29 DIAGNOSIS — D631 Anemia in chronic kidney disease: Secondary | ICD-10-CM | POA: Diagnosis not present

## 2021-08-29 DIAGNOSIS — R4182 Altered mental status, unspecified: Secondary | ICD-10-CM | POA: Diagnosis not present

## 2021-08-29 DIAGNOSIS — M5440 Lumbago with sciatica, unspecified side: Secondary | ICD-10-CM | POA: Diagnosis not present

## 2021-08-29 DIAGNOSIS — N186 End stage renal disease: Secondary | ICD-10-CM | POA: Diagnosis not present

## 2021-08-29 DIAGNOSIS — R7881 Bacteremia: Secondary | ICD-10-CM | POA: Diagnosis not present

## 2021-08-29 HISTORY — PX: IR FLUORO GUIDE CV LINE LEFT: IMG2282

## 2021-08-29 LAB — GLUCOSE, CAPILLARY: Glucose-Capillary: 99 mg/dL (ref 70–99)

## 2021-08-29 MED ORDER — PHENYTOIN SODIUM 50 MG/ML IJ SOLN
100.0000 mg | Freq: Three times a day (TID) | INTRAMUSCULAR | Status: DC
  Administered 2021-08-30 – 2021-08-31 (×5): 100 mg via INTRAVENOUS
  Filled 2021-08-29 (×8): qty 2

## 2021-08-29 MED ORDER — IOHEXOL 300 MG/ML  SOLN: 10.0000 mL | Freq: Once | INTRAMUSCULAR | Status: DC | PRN

## 2021-08-29 MED ORDER — SODIUM CHLORIDE 0.9 % IV SOLN
1500.0000 mg | Freq: Once | INTRAVENOUS | Status: AC
  Administered 2021-08-29: 1500 mg via INTRAVENOUS
  Filled 2021-08-29: qty 30

## 2021-08-29 NOTE — TOC Progression Note (Signed)
Transition of Care [facility]) - Progression Note    Patient Details  Name: [name] [name] MRN: [id number] Date of Birth: 1961/07/05  [facility]) CM/SW Tse Bonito, RN Phone Number: 08/29/2021, 9:01 AM  Clinical Narrative:    Due to medical condition the patient is not ready to DC to STR SNF at this time, the family is discussing options and next steps, TOC will continue to Monitor for needs        Expected Discharge Plan and Services           Expected Discharge Date: 08/21/21                                     Social Determinants of Health (SDOH) Interventions    Readmission Risk Interventions No flowsheet data found.

## 2021-08-29 NOTE — Progress Notes (Signed)
Graft accessed by tessa hice, ccht

## 2021-08-29 NOTE — Progress Notes (Signed)
OT Cancellation Note  Patient Details Name: [name] [name] MRN: [id number] DOB: 10/07/1961   Cancelled Treatment:    Reason Eval/Treat Not Completed: Fatigue/lethargy limiting ability to participate. Upon attempt, pt sleeping soundly. He does not sustain alertness with verbal/tactile cues or bed repositioning.  Pt noted with HD scheduled for today. Will re-attempt OT tx at later date/time as pt is able to participate.   Ardeth Perfect., MPH, MS, OTR/L ascom [phone] 08/29/21, 9:56 AM

## 2021-08-29 NOTE — Procedures (Signed)
Interventional Radiology Procedure Note  Procedure: Exchange of left IJ tunneled Powerline  Indication: Retracted non-functioning Powerline  Findings:   Successful exchange of Powerline.   Powerline is ready for use.  Please refer to procedural dictation for full description.  Complications: None  EBL: < 10 mL  Miachel Roux, MD [phone]

## 2021-08-29 NOTE — Progress Notes (Signed)
This Rapid Response RN called to assess patient. Primary RN states that pt just arrived back to room from dialysis and was alert but not responding. On my arrival to room, Pt has eyes open and will look at you when his name is called but is unable to follow commands and is nonverbal at this time. Neurologist, Dr. Cheral Marker, ordered STAT head CT. He thinks it more related to seizure that stroke at this time. Will follow up when CT results are in.

## 2021-08-29 NOTE — Plan of Care (Signed)

## 2021-08-29 NOTE — Progress Notes (Addendum)
Subjective: Dialysis completed shorlty before 9 PM. Patient was at baseline in dialysis just before returning to the floor. After returning to the floor, the patient was with decreased responsiveness to questions and commands, but with eyes open and localizing as well as groaning to sternal rub.   Objective: Intake/Output from previous day: No intake/output data recorded. Intake/Output this shift: No intake/output data recorded. Nutritional status:  Diet Order             DIET - DYS 1 Room service appropriate? Yes; Fluid consistency: Thin  Diet effective now           Diet - low sodium heart healthy                  BP (!) 132/57 (BP Location: Left Arm)   Pulse 88   Temp 98.2 F (36.8 C) (Oral)   Resp 16   Ht 6\' 1"  (1.854 m)   Wt 91 kg   SpO2 100%   BMI 26.47 kg/m    HEENT: [place]/AT Lungs: Respirations unlabored  Neurologic Exam: Ment: Eyes open. Not responding to verbal commands or questions. Nonverbal except for a groan with sternal rub, to which he localized with BUE. Not tracking visually; has a blank start with eyes slightly to the right of midline.  CN: Eyes slightly to the right of midline. No nystagmus. Does not fixate or track. Face is flaccicly symmetric.  Motor/Sensory: Localized briskly with BUE to sternal rub without asymmetry. Weakly withdraws BLE to noxious plantar stimulation, without asymmetry.  Reflexes: Unremarkable  Lab Results: Results for orders placed or performed during the hospital encounter of 08/08/21 (from the past 48 hour(s))  CBC     Status: Abnormal   Collection Time: 08/28/21  4:37 AM  Result Value Ref Range   WBC 10.6 (H) 4.0 - 10.5 K/uL   RBC 3.11 (L) 4.22 - 5.81 MIL/uL   Hemoglobin 9.8 (L) 13.0 - 17.0 g/dL   HCT 31.1 (L) 39.0 - 52.0 %   MCV 100.0 80.0 - 100.0 fL   MCH 31.5 26.0 - 34.0 pg   MCHC 31.5 30.0 - 36.0 g/dL   RDW 22.5 (H) 11.5 - 15.5 %   Platelets 137 (L) 150 - 400 K/uL   nRBC 1.1 (H) 0.0 - 0.2 %    Comment: Performed  at [facility], [address] [place], [place] [address]  Renal function panel     Status: Abnormal   Collection Time: 08/28/21  4:37 AM  Result Value Ref Range   Sodium 138 135 - 145 mmol/L   Potassium 4.6 3.5 - 5.1 mmol/L   Chloride 97 (L) 98 - 111 mmol/L   CO2 23 22 - 32 mmol/L   Glucose, Bld 85 70 - 99 mg/dL    Comment: Glucose reference range applies only to samples taken after fasting for at least 8 hours.   BUN 71 (H) 6 - 20 mg/dL   Creatinine, Ser 9.96 (H) 0.61 - 1.24 mg/dL   Calcium 10.2 8.9 - 10.3 mg/dL   Phosphorus 7.8 (H) 2.5 - 4.6 mg/dL   Albumin 2.7 (L) 3.5 - 5.0 g/dL   GFR, Estimated 6 (L) >60 mL/min    Comment: (NOTE) Calculated using the CKD-EPI Creatinine Equation (2021)    Anion gap 18 (H) 5 - 15    Comment: Performed at [facility], [address] [place],  [address]  Blood gas, arterial     Status: Abnormal   Collection  Time: 08/28/21  6:18 PM  Result Value Ref Range   FIO2 0.28    Delivery systems NASAL CANNULA    pH, Arterial 7.30 (L) 7.350 - 7.450   pCO2 arterial 48 32.0 - 48.0 mmHg   pO2, Arterial 124 (H) 83.0 - 108.0 mmHg   Bicarbonate 23.6 20.0 - 28.0 mmol/L   Acid-base deficit 2.9 (H) 0.0 - 2.0 mmol/L   O2 Saturation 98.4 %   Patient temperature 37.0    Collection site LEFT BRACHIAL    Sample type ARTERIAL DRAW    Allens test (pass/fail) PASS PASS    Comment: Performed at [facility], [address], [place], [place] [address]    Recent Results (from the past 240 hour(s))  Resp Panel by RT-PCR (Flu A&B, Covid) Nasopharyngeal Swab     Status: None   Collection Time: 08/21/21  9:50 AM   Specimen: Nasopharyngeal Swab; Nasopharyngeal(NP) swabs in vial transport medium  Result Value Ref Range Status   SARS Coronavirus 2 by RT PCR NEGATIVE NEGATIVE Final    Comment: (NOTE) SARS-CoV-2 target nucleic acids are NOT DETECTED.  The SARS-CoV-2 RNA is generally detectable in upper respiratory specimens during  the acute phase of infection. The lowest concentration of SARS-CoV-2 viral copies this assay can detect is 138 copies/mL. A negative result does not preclude SARS-Cov-2 infection and should not be used as the sole basis for treatment or other patient management decisions. A negative result may occur with  improper specimen collection/handling, submission of specimen other than nasopharyngeal swab, presence of viral mutation(s) within the areas targeted by this assay, and inadequate number of viral copies(<138 copies/mL). A negative result must be combined with clinical observations, patient history, and epidemiological information. The expected result is Negative.  Fact Sheet for Patients:  EntrepreneurPulse.com.au  Fact Sheet for Healthcare Providers:  IncredibleEmployment.be  This test is no t yet approved or cleared by the [place] FDA and  has been authorized for detection and/or diagnosis of SARS-CoV-2 by FDA under an Emergency Use Authorization (EUA). This EUA will remain  in effect (meaning this test can be used) for the duration of the COVID-19 declaration under Section 564(b)(1) of the Act, 21 U.S.C.section 360bbb-3(b)(1), unless the authorization is terminated  or revoked sooner.       Influenza A by PCR NEGATIVE NEGATIVE Final   Influenza B by PCR NEGATIVE NEGATIVE Final    Comment: (NOTE) The Xpert Xpress SARS-CoV-2/FLU/RSV plus assay is intended as an aid in the diagnosis of influenza from Nasopharyngeal swab specimens and should not be used as a sole basis for treatment. Nasal washings and aspirates are unacceptable for Xpert Xpress SARS-CoV-2/FLU/RSV testing.  Fact Sheet for Patients: EntrepreneurPulse.com.au  Fact Sheet for Healthcare Providers: IncredibleEmployment.be  This test is not yet approved or cleared by the [place] FDA and has been authorized for detection and/or  diagnosis of SARS-CoV-2 by FDA under an Emergency Use Authorization (EUA). This EUA will remain in effect (meaning this test can be used) for the duration of the COVID-19 declaration under Section 564(b)(1) of the Act, 21 U.S.C. section 360bbb-3(b)(1), unless the authorization is terminated or revoked.  Performed at [facility], [address] [place], [place] [address]     Lipid Panel No results for input(s): CHOL, TRIG, HDL, CHOLHDL, VLDL, LDLCALC in the last 72 hours.  Studies/Results: EEG adult  Result Date: 08/28/2021 Lora Havens, MD     08/28/2021  2:38 PM Patient Name: KAIS MONJE MRN: [id number]  Epilepsy Attending: Lora Havens Referring Physician/Provider: Dr Kathie Dike Date: 08/28/2021 Duration: 29 mins Patient history: 61 yo M with ams. EEG to evaluate for seizure Level of alertness: Awake AEDs during EEG study: None Technical aspects: This EEG study was done with scalp electrodes positioned according to the 10-20 International system of electrode placement. Electrical activity was acquired at a sampling rate of 500Hz  and reviewed with a high frequency filter of 70Hz  and a low frequency filter of 1Hz . EEG data were recorded continuously and digitally stored. Description: No posterior dominant rhythm was seen. EEG showed continuous generalized and lateralized right hemisphere 3 to 6 Hz theta-delta slowing. Physiologic photic driving was not seen during photic stimulation.  Hyperventilation was not performed.   ABNORMALITY - Continuous slow, generalized and lateralized right hemisphere IMPRESSION: This study is  suggestive of uggestive of cortical dysfunction arising from right hemisphere, nonspecific etiology but secondary to underlying structural abnormality. There is also moderat diffuse encephalopathy, nonspecific etiology. No seizures or epileptiform discharges were seen throughout the recording. Priyanka Barbra Sarks    Medications: Scheduled:  apixaban  2.5  mg Oral BID   Chlorhexidine Gluconate Cloth  6 each Topical Q0600   epoetin (EPOGEN/PROCRIT) injection  4,000 Units Intravenous Q M,W,F-HD   feeding supplement (NEPRO CARB STEADY)  237 mL Oral TID BM   lidocaine  2 patch Transdermal Q24H   nicotine  21 mg Transdermal Daily   senna-docusate  1 tablet Oral BID   sevelamer carbonate  1,600 mg Oral TID WC   Continuous:  sodium chloride 250 mL (08/27/21 2052)   ampicillin (OMNIPEN) IV 2 g (08/28/21 2330)   cefTRIAXone (ROCEPHIN)  IV 2 g (08/29/21 0045)   Assessment: 60 yo with ESRD, HTN, tobacco and poly substance abuse presented with worsening lower back pain. Found to have sepsis 2/2 e. 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 Neurology consulted for possible punctate acute/subacute infarcts in L temporal lobe and R insula.  Exam this evening at 9:05 PM reveals an awake, unresponsive state, which occurred acutely after dialysis. No hypertonic posturing, jerking or twitching noted, but eyes are slightly to the right of midline and head is also turned to the right.  - Punctate infarcts on MRI. They are favored to be incidental given that they are extremely small. Stroke workup is completed. 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 - Encephalopathy. Sepsis is felt to be the major contributing factor to his encephalopathy. Routine EEG does show continuous generalized slowing, consistent with an encephalopathy. However, the EEG also reveals a lateralized component to the slowing, involving his right cerebral hemisphere. This would not be due to his strokes, which are too small to significantly impact the EEG. There is no large area of encephalomalacia in his right hemisphere to account for this either. DDx for the focal slowing includes focal subclinical seizure activity.    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 Loading with 20 mg PE/kg IV followed by scheduled Dilantin IV 100 mg TID.  - Dilantin level at 0800 tomorrow morning.  - Keppra not an optimal choice given ESRD. Valproic not a good choice given national shortage of IV formulation.  - Repeat EEG on Wednesday (ordered) to assess  for possible resolution of the lateralized slowing.    Addendum: - STAT CT head images personally reviewed. No acute findings appreciated. Awaiting official Radiology report - Discussed with Hospitalist     LOS: 20 days   @Electronically  signed: Dr. Kerney Elbe 08/29/2021  8:16 AM

## 2021-08-29 NOTE — Progress Notes (Addendum)
Cross coverage  Informed by neurologist, Dr. Cheral Marker that following dialysis patient had an awake unresponsive episode, unofficial wet read of CT was negative.  Patient was started on fosphenytoin for possible seizure.  Okay to keep patient in current bed status.  Please refer to neurology note for further details.  We monitor patient overnight for any events.

## 2021-08-29 NOTE — Progress Notes (Addendum)
AtivanPatient ID: [name] [name], male   DOB: 1961/04/20, 60 y.o.   MRN: [id number]  PROGRESS NOTE    [name] [name]  YTK:[id number] DOB: 01-09-1961 DOA: 08/08/2021 PCP: Pcp, No   Brief Narrative:  60 y.o. male with medical history significant of ESRD-HD, hypertension, tobacco abuse, polysubstance abuse, diverticulitis, anemia, jugular vein thrombosis, GI bleeding, right pneumothorax presented with worsening lower back pain.  He was found to have sepsis secondary to Enterococcus faecalis bacteremia.  ID was consulted and he has been on ampicillin and ceftriaxone.  He was found to have possible developing osteomyelitis in C5-C6 with possible early left psoas abscess formation and possible early discitis in L4-L5.  Nephrology was also consulted for continuation of dialysis.  TEE showed aortic valve endocarditis.  PT recommended SNF placement.  Plans were for SNF discharge with outpatient antibiotics, but patient has had waxing and waning mental status.  He has had periods of lethargy and agitation.  This does not appear to be his baseline.  MRI brain was performed that indicated possible acute infarcts.  Neurology consulted.  Largest barrier to discharge at this point is his persistent encephalopathy.  Palliative care following for goals of care.  Assessment & Plan:   Sepsis: Possibly evolving on admission Enterococcus faecalis bacteremia Aortic valve endocarditis -ID following.  Currently on Rocephin and ampicillin. -TTE negative for vegetations. 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  ID is recommending 6 to weeks of ampicillin and Rocephin till 09/23/2021 followed by p.o. amoxicillin for a few weeks.  -Status post tunneled catheter placement on 08/18/2021 by IR -Blood cultures from 08/11/2021 grew E faecalis.  Blood cultures from 08/12/2021 have been negative so far. -Sepsis has resolved.  Currently hemodynamically stable with no temperature spikes.  Severe lower back pain Possible developing  osteomyelitis in C5-C6 Left psoas muscle infection with superimposed 1.3 cm phlegmon/early abscess formation Possible early discitis in L4-L5 -Continue antibiotics as above.  Continue pain management and bowel regimen   End-stage renal disease on hemodialysis -Nephrology following.  Dialysis as per nephrology schedule -Dialysis schedule changed from M WF to TTS  Rectal bleeding -Noted to have bright red blood with bowel movement t on 9/16 no further blood with further bowel movements since then -Suspect this may be related to hemorrhoids -Hemoglobin has been stable -If he continues to have bleeding, may need to have GI involvement. -Eliquis was briefly put on hold, but was resumed when he did not have any further bleeding  Acute encephalopathy -Mental status has appeared to progressively get worse through his hospitalization -CT head was unremarkable, no clear metabolic cause -MRI brain confirms acute infarct -EEG on 9/19 was suggestive of cortical dysfunction arising from right hemisphere, nonspecific etiology. -Neurology consulted, appreciate input.  Repeat EEG ordered for 9/21.  Holding off on anticonvulsants for now. -Encephalopathy possibly related to underlying infectious process versus hospital delirium -We will use Ativan for agitation as needed  Acute CVA -MRI notes probable punctate acute infarcts in the posterior left temporal lobe and right insular cortex -Neurology consulted, appreciate input  Anemia of chronic disease -From renal failure.  Hemoglobin stable.  Monitor intermittently.  Polysubstance abuse and tobacco abuse -Patient was counseled regarding cessation by prior hospitalist.  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 Keturah Barre recommendations.    Hypertension Hypotension -Blood pressure currently stable.  Off amlodipine  Chronic combined  CHF -LVEF  of 30 to 35% as per TTE on 08/13/2021.  Volume managed by dialysis.  Strict input output.  Daily weights.  Fluid restriction  Depression -Patient's mother reports that patient has been depressed for the past 2 years since the loss of his child -Will need outpatient psychiatry follow-up  Generalized conditioning -PT/OT recommend SNF placement.  Social worker following.  Goals of care -Patient has had a prolonged hospitalization and has multiple underlying medical conditions -He appears to have a persistent encephalopathy, and is now refusing to eat and drink -He has been treated for bacteremia with endocarditis and discitis -With his underlying end-stage renal disease and persistent encephalopathy, his overall prognosis does appear to be poor -I discussed CODE STATUS with patient's parents and they were clear that patient would not want to receive correct measures at the end of his life and agreed to DNR.  His mother was a previous volunteer at hospice for many years and understands DNR status.  She also recognizes that with his refusal to eat/drink, his prognosis is not good.  They are agreeable to meet with palliative care to discuss further goals.  DVT prophylaxis: Eliquis Code Status: DNR, confirmed with patient's parents Family Communication: Updated patient's mother and father over the phone 9/20 Disposition Plan: Status is: Inpatient  Remains inpatient appropriate because:Inpatient level of care appropriate due to severity of illness  Dispo: The patient is from: Home              Anticipated d/c is to: SNF              Patient currently is medically stable to d/c.    Difficult to place patient No  Consultants: ID/cardiology/nephrology/neurology/palliative care  Procedures: TTE/TEE Tunneled catheter placement by IR on 08/18/2021  Antimicrobials:  Anti-infectives (From admission, onward)    Start     Dose/Rate Route Frequency Ordered Stop   08/21/21 0000  ampicillin  IVPB        2 g Intravenous Every 12 hours 08/21/21 0940 09/27/21 2359   08/21/21 0000  cefTRIAXone (ROCEPHIN) IVPB        2 g Intravenous Every 12 hours 08/21/21 0940 09/27/21 2359   08/14/21 1530  cefTRIAXone (ROCEPHIN) 2 g in sodium chloride 0.9 % 100 mL IVPB        2 g 200 mL/hr over 30 Minutes Intravenous Every 12 hours 08/14/21 1443     08/14/21 1200  vancomycin (VANCOCIN) IVPB 1000 mg/200 mL premix  Status:  Discontinued        1,000 mg 200 mL/hr over 60 Minutes Intravenous Every M-W-F (Hemodialysis) 08/11/21 1831 08/12/21 0517   08/12/21 2200  cefTRIAXone (ROCEPHIN) 2 g in sodium chloride 0.9 % 100 mL IVPB  Status:  Discontinued        2 g 200 mL/hr over 30 Minutes Intravenous Every 12 hours 08/12/21 1923 08/14/21 1443   08/12/21 0615  ampicillin (OMNIPEN) 2 g in sodium chloride 0.9 % 100 mL IVPB        2 g 300 mL/hr over 20 Minutes Intravenous Every 12 hours 08/12/21 0520     08/11/21 1930  vancomycin (VANCOREADY) IVPB 2000 mg/400 mL        2,000 mg 200 mL/hr over 120 Minutes Intravenous  Once 08/11/21 1831 08/12/21 0156   08/11/21 1900  cefTRIAXone (ROCEPHIN) 2 g in sodium chloride 0.9 % 100 mL IVPB  Status:  Discontinued        2 g 200 mL/hr over 30 Minutes Intravenous Every 12  hours 08/11/21 1829 08/12/21 0517        Subjective: Patient is seen on dialysis.  He received Ativan earlier in the day and is currently still somnolent.  P.o. intake remains poor.  Objective: Vitals:   08/29/21 1915 08/29/21 1930 08/29/21 1945 08/29/21 2000  BP: (!) 104/52 104/62 (!) 120/53 (!) 112/54  Pulse:      Resp: (!) 0 12 15 14   Temp:      TempSrc:      SpO2:      Weight:      Height:       No intake or output data in the 24 hours ending 08/29/21 2027   Filed Weights   08/08/21 0625 08/16/21 0754  Weight: 91 kg 91 kg    Examination:  General exam: somnolent Respiratory system: Clear to auscultation. Respiratory effort normal. Cardiovascular system:RRR. No murmurs, rubs,  gallops. Gastrointestinal system: Abdomen is nondistended, soft and nontender. No organomegaly or masses felt. Normal bowel sounds heard. Central nervous system: unable to participate in exam Extremities: No C/C/E, +pedal pulses Skin: No rashes, lesions or ulcers Psychiatry: somnolent   Data Reviewed: I have personally reviewed following labs and imaging studies  CBC: Recent Labs  Lab 08/23/21 0606 08/24/21 0503 08/25/21 0605 08/26/21 0525 08/28/21 0437  WBC 17.2* 12.5* 11.5* 9.7 10.6*  NEUTROABS 15.0*  --   --   --   --   HGB 10.2* 9.7* 9.3* 9.4* 9.8*  HCT 33.3* 30.1* 28.8* 30.1* 31.1*  MCV 98.2 98.4 96.6 99.0 100.0  PLT 240 253 234 190 381*   Basic Metabolic Panel: Recent Labs  Lab 08/23/21 0606 08/24/21 0503 08/25/21 0605 08/26/21 0525 08/28/21 0437  NA 139 139 137 138 138  K 5.8* 4.8 4.8 3.8 4.6  CL 97* 95* 98 97* 97*  CO2 25 28 28 29 23   GLUCOSE 57* 81 85 120* 85  BUN 63* 55* 73* 50* 71*  CREATININE 9.01* 7.13* 8.44* 6.84* 9.96*  CALCIUM 10.3 10.0 9.8 9.7 10.2  PHOS  --  8.0*  --   --  7.8*   GFR: Estimated Creatinine Clearance: 9.2 mL/min (A) (by C-G formula based on SCr of 9.96 mg/dL (H)). Liver Function Tests: Recent Labs  Lab 08/24/21 0503 08/26/21 0525 08/28/21 0437  AST  --  30  --   ALT  --  19  --   ALKPHOS  --  72  --   BILITOT  --  0.8  --   PROT  --  7.2  --   ALBUMIN 2.5* 2.5* 2.7*   No results for input(s): LIPASE, AMYLASE in the last 168 hours. Recent Labs  Lab 08/25/21 1528  AMMONIA 14   Coagulation Profile: No results for input(s): INR, PROTIME in the last 168 hours. Cardiac Enzymes: No results for input(s): CKTOTAL, CKMB, CKMBINDEX, TROPONINI in the last 168 hours. BNP (last 3 results) No results for input(s): PROBNP in the last 8760 hours. HbA1C: No results for input(s): HGBA1C in the last 72 hours.  CBG: Recent Labs  Lab 08/29/21 2004  GLUCAP 99    Lipid Profile: No results for input(s): CHOL, HDL, LDLCALC, TRIG,  CHOLHDL, LDLDIRECT in the last 72 hours.  Thyroid Function Tests: No results for input(s): TSH, T4TOTAL, FREET4, T3FREE, THYROIDAB in the last 72 hours. Anemia Panel: No results for input(s): VITAMINB12, FOLATE, FERRITIN, TIBC, IRON, RETICCTPCT in the last 72 hours. Sepsis Labs: No results for input(s): PROCALCITON, LATICACIDVEN in the last 168 hours.  Recent Results (from the past 240 hour(s))  Resp Panel by RT-PCR (Flu A&B, Covid) Nasopharyngeal Swab     Status: None   Collection Time: 08/21/21  9:50 AM   Specimen: Nasopharyngeal Swab; Nasopharyngeal(NP) swabs in vial transport medium  Result Value Ref Range Status   SARS Coronavirus 2 by RT PCR NEGATIVE NEGATIVE Final    Comment: (NOTE) SARS-CoV-2 target nucleic acids are NOT DETECTED.  The SARS-CoV-2 RNA is generally detectable in upper respiratory specimens during the acute phase of infection. The lowest concentration of SARS-CoV-2 viral copies this assay can detect is 138 copies/mL. A negative result does not preclude SARS-Cov-2 infection and should not be used as the sole basis for treatment or other patient management decisions. A negative result may occur with  improper specimen collection/handling, submission of specimen other than nasopharyngeal swab, presence of viral mutation(s) within the areas targeted by this assay, and inadequate number of viral copies(<138 copies/mL). A negative result must be combined with clinical observations, patient history, and epidemiological information. The expected result is Negative.  Fact Sheet for Patients:  EntrepreneurPulse.com.au  Fact Sheet for Healthcare Providers:  IncredibleEmployment.be  This test is no t yet approved or cleared by the [place] FDA and  has been authorized for detection and/or diagnosis of SARS-CoV-2 by FDA under an Emergency Use Authorization (EUA). This EUA will remain  in effect (meaning this test can be used)  for the duration of the COVID-19 declaration under Section 564(b)(1) of the Act, 21 U.S.C.section 360bbb-3(b)(1), unless the authorization is terminated  or revoked sooner.       Influenza A by PCR NEGATIVE NEGATIVE Final   Influenza B by PCR NEGATIVE NEGATIVE Final    Comment: (NOTE) The Xpert Xpress SARS-CoV-2/FLU/RSV plus assay is intended as an aid in the diagnosis of influenza from Nasopharyngeal swab specimens and should not be used as a sole basis for treatment. Nasal washings and aspirates are unacceptable for Xpert Xpress SARS-CoV-2/FLU/RSV testing.  Fact Sheet for Patients: EntrepreneurPulse.com.au  Fact Sheet for Healthcare Providers: IncredibleEmployment.be  This test is not yet approved or cleared by the [place] FDA and has been authorized for detection and/or diagnosis of SARS-CoV-2 by FDA under an Emergency Use Authorization (EUA). This EUA will remain in effect (meaning this test can be used) for the duration of the COVID-19 declaration under Section 564(b)(1) of the Act, 21 U.S.C. section 360bbb-3(b)(1), unless the authorization is terminated or revoked.  Performed at [facility], [address], [place], [place] [address]           Radiology Studies: IR Fluoro Guide CV Line Left  Result Date: 08/29/2021 INDICATION: 60 year old gentleman who requires long-term central venous access for antibiotic therapy returns to IR for placement of retracted left power line. Previous placement was quite difficult due to extensive central venous stenosis. Catheter tip was left in azygous vein. EXAM: Fluoroscopy guided replacement of retracted power line MEDICATIONS: None ANESTHESIA/SEDATION: None FLUOROSCOPY TIME:  Fluoroscopy Time: 1 minutes 42 seconds (13.2 mGy). COMPLICATIONS: None immediate. PROCEDURE: Informed written consent was obtained from the patient's family after a thorough discussion of the procedural risks,  benefits and alternatives. All questions were addressed. Maximal Sterile Barrier Technique was utilized including caps, mask, sterile gowns, sterile gloves, sterile drape, hand hygiene and skin antiseptic. A timeout was performed prior to the initiation of the procedure. Patient positioned supine on the procedure table. The external segment of the retracted left anterior power line and surrounding skin prepped and draped in  usual fashion. Scout image demonstrated the catheter tip to be slightly retracted. The catheter did aspirate after removing the retention suture. Given that the cuff was already exposed, decision was made to replace the single-lumen power line. The existing power line was removed over 0.035 inch stiff Glidewire. New power line was cut to same length of 34 cm and inserted over the stiff glidewire. The tip was positioned in the azygous vein at the level of the mid chest. The catheter aspirated and flushed well. The catheter was secured to skin with silk suture and covered with bio patch and sterile dressing. IMPRESSION: Successful exchange of retracted left IJ power line. New Powerline ready for use. Patient has extensive central venous occlusion and the only central venous structure amenable to catheter placement was the azygous vein. If this catheter becomes nonfunctional or is retracted , contrast-enhanced chest CT should be performed to determine degree of venous stenosis. If clinically warranted, venous recanalization may be considered. A venous recanalization is not possible, and the patient still requires central venous access, femoral approach central venous catheter should be considered. Electronically Signed   By: Miachel Roux M.D.   On: 08/29/2021 16:20   DG Chest Port 1 View  Result Date: 08/29/2021 CLINICAL DATA:  Post PICC line placement in a 60 year old male. EXAM: PORTABLE CHEST 1 VIEW COMPARISON:  08/11/2021. FINDINGS: Dialysis catheter again terminates in the mid to lower  RIGHT atrium. Interval placement of a LEFT-sided central venous access device, tip orientation suggest placement within the azygous vein. Correlation with recent imaging report from placement states this is intended site of this catheter. Cardiomediastinal contours with stable cardiac enlargement. Pleural calcification and pleural fluid in the RIGHT chest with similar appearance. No signs of new lobar consolidation or pneumothorax. Signs of renal osteodystrophy without acute skeletal process on limited assessment. Evidence of prior trauma to RIGHT ribs as before. IMPRESSION: Dialysis catheter again terminates in the mid to lower RIGHT atrium. Interval placement of a LEFT-sided central venous access device, tip orientation suggests placement within the azygous vein. This appears to be the intended placement due to central venous occlusion involving distal SVC as outlined in the previous interventional radiology report. Otherwise stable appearance of the chest with chronic volume loss and pleuroparenchymal scarring/calcification in the RIGHT chest. Electronically Signed   By: Zetta Bills M.D.   On: 08/29/2021 10:32   EEG adult  Result Date: 08/28/2021 Lora Havens, MD     08/28/2021  2:38 PM Patient Name: [name] [name] MRN: [id number] Epilepsy Attending: Lora Havens Referring Physician/Provider: Dr Kathie Dike Date: 08/28/2021 Duration: 29 mins Patient history: 60 yo M with ams. EEG to evaluate for seizure Level of alertness: Awake AEDs during EEG study: None Technical aspects: This EEG study was done with scalp electrodes positioned according to the 10-20 International system of electrode placement. Electrical activity was acquired at a sampling rate of 500Hz  and reviewed with a high frequency filter of 70Hz  and a low frequency filter of 1Hz . EEG data were recorded continuously and digitally stored. Description: No posterior dominant rhythm was seen. EEG showed continuous generalized and lateralized  right hemisphere 3 to 6 Hz theta-delta slowing. Physiologic photic driving was not seen during photic stimulation.  Hyperventilation was not performed.   ABNORMALITY - Continuous slow, generalized and lateralized right hemisphere IMPRESSION: This study is  suggestive of uggestive of cortical dysfunction arising from right hemisphere, nonspecific etiology but secondary to underlying structural abnormality. There is also moderat diffuse encephalopathy, nonspecific  etiology. No seizures or epileptiform discharges were seen throughout the recording. Priyanka Barbra Sarks        Scheduled Meds:  apixaban  2.5 mg Oral BID   Chlorhexidine Gluconate Cloth  6 each Topical Q0600   epoetin (EPOGEN/PROCRIT) injection  4,000 Units Intravenous Q M,W,F-HD   feeding supplement (NEPRO CARB STEADY)  237 mL Oral TID BM   lidocaine  2 patch Transdermal Q24H   nicotine  21 mg Transdermal Daily   senna-docusate  1 tablet Oral BID   sevelamer carbonate  1,600 mg Oral TID WC   Continuous Infusions:  sodium chloride 250 mL (08/27/21 2052)   ampicillin (OMNIPEN) IV 2 g (08/28/21 2330)   cefTRIAXone (ROCEPHIN)  IV 2 g (08/29/21 0045)          Kathie Dike, MD [facility] 08/29/2021, 8:27 PM

## 2021-08-29 NOTE — Progress Notes (Signed)
[facility]  ROUNDING NOTE   Subjective:   Per nursing, patient very agitated and pulling at lines Given Ativan Patient seen resting quietly Arouseable, but not interactive   Objective:  Vital signs in last 24 hours:  Temp:  [96.2 F (35.7 C)-97.9 F (36.6 C)] 97.5 F (36.4 C) (09/20 0905) Pulse Rate:  [81-95] 84 (09/20 0905) Resp:  [15-21] 16 (09/20 0905) BP: (105-156)/(55-86) 105/55 (09/20 0905) SpO2:  [69 %-100 %] 100 % (09/20 0905)  Weight change:  Filed Weights   08/08/21 0625 08/16/21 0754  Weight: 91 kg 91 kg    Intake/Output: No intake/output data recorded.   Intake/Output this shift:  No intake/output data recorded.  Physical Exam: General: NAD, resting quietly  Head: Normocephalic,atraumatic  Eyes: Anicteric  Lungs Lungs clear to auscultation bilaterally  Heart: Regular rate and rhythm  Abdomen:  Soft, nontender, nondistended  Extremities: No peripheral edema  Neurologic: Drowsy, unable to answer questions   Skin: No acute lesions or rashes  Access: Rt Herograft    Basic Metabolic Panel: Recent Labs  Lab 08/23/21 0606 08/24/21 0503 08/25/21 0605 08/26/21 0525 08/28/21 0437  NA 139 139 137 138 138  K 5.8* 4.8 4.8 3.8 4.6  CL 97* 95* 98 97* 97*  CO2 25 28 28 29 23   GLUCOSE 57* 81 85 120* 85  BUN 63* 55* 73* 50* 71*  CREATININE 9.01* 7.13* 8.44* 6.84* 9.96*  CALCIUM 10.3 10.0 9.8 9.7 10.2  PHOS  --  8.0*  --   --  7.8*     Liver Function Tests: Recent Labs  Lab 08/24/21 0503 08/26/21 0525 08/28/21 0437  AST  --  30  --   ALT  --  19  --   ALKPHOS  --  72  --   BILITOT  --  0.8  --   PROT  --  7.2  --   ALBUMIN 2.5* 2.5* 2.7*     No results for input(s): LIPASE, AMYLASE in the last 168 hours. Recent Labs  Lab 08/25/21 1528  AMMONIA 14     CBC: Recent Labs  Lab 08/23/21 0606 08/24/21 0503 08/25/21 0605 08/26/21 0525 08/28/21 0437  WBC 17.2* 12.5* 11.5* 9.7 10.6*  NEUTROABS 15.0*  --   --   --   --    HGB 10.2* 9.7* 9.3* 9.4* 9.8*  HCT 33.3* 30.1* 28.8* 30.1* 31.1*  MCV 98.2 98.4 96.6 99.0 100.0  PLT 240 253 234 190 137*     Cardiac Enzymes: No results for input(s): CKTOTAL, CKMB, CKMBINDEX, TROPONINI in the last 168 hours.  BNP: Invalid input(s): POCBNP  CBG: No results for input(s): GLUCAP in the last 168 hours.   Microbiology: Results for orders placed or performed during the hospital encounter of 08/08/21  Resp Panel by RT-PCR (Flu A&B, Covid) Nasopharyngeal Swab     Status: None   Collection Time: 08/08/21  1:22 PM   Specimen: Nasopharyngeal Swab; Nasopharyngeal(NP) swabs in vial transport medium  Result Value Ref Range Status   SARS Coronavirus 2 by RT PCR NEGATIVE NEGATIVE Final    Comment: (NOTE) SARS-CoV-2 target nucleic acids are NOT DETECTED.  The SARS-CoV-2 RNA is generally detectable in upper respiratory specimens during the acute phase of infection. The lowest concentration of SARS-CoV-2 viral copies this assay can detect is 138 copies/mL. A negative result does not preclude SARS-Cov-2 infection and should not be used as the sole basis for treatment or other patient management decisions. A negative result may occur  with  improper specimen collection/handling, submission of specimen other than nasopharyngeal swab, presence of viral mutation(s) within the areas targeted by this assay, and inadequate number of viral copies(<138 copies/mL). A negative result must be combined with clinical observations, patient history, and epidemiological information. The expected result is Negative.  Fact Sheet for Patients:  EntrepreneurPulse.com.au  Fact Sheet for Healthcare Providers:  IncredibleEmployment.be  This test is no t yet approved or cleared by the [place] FDA and  has been authorized for detection and/or diagnosis of SARS-CoV-2 by FDA under an Emergency Use Authorization (EUA). This EUA will remain  in effect  (meaning this test can be used) for the duration of the COVID-19 declaration under Section 564(b)(1) of the Act, 21 U.S.C.section 360bbb-3(b)(1), unless the authorization is terminated  or revoked sooner.       Influenza A by PCR NEGATIVE NEGATIVE Final   Influenza B by PCR NEGATIVE NEGATIVE Final    Comment: (NOTE) The Xpert Xpress SARS-CoV-2/FLU/RSV plus assay is intended as an aid in the diagnosis of influenza from Nasopharyngeal swab specimens and should not be used as a sole basis for treatment. Nasal washings and aspirates are unacceptable for Xpert Xpress SARS-CoV-2/FLU/RSV testing.  Fact Sheet for Patients: EntrepreneurPulse.com.au  Fact Sheet for Healthcare Providers: IncredibleEmployment.be  This test is not yet approved or cleared by the [place] FDA and has been authorized for detection and/or diagnosis of SARS-CoV-2 by FDA under an Emergency Use Authorization (EUA). This EUA will remain in effect (meaning this test can be used) for the duration of the COVID-19 declaration under Section 564(b)(1) of the Act, 21 U.S.C. section 360bbb-3(b)(1), unless the authorization is terminated or revoked.  Performed at [facility], [address] [place], [place] [address]   Culture, blood (Routine X 2) w Reflex to ID Panel     Status: Abnormal   Collection Time: 08/11/21  5:47 PM   Specimen: BLOOD  Result Value Ref Range Status   Specimen Description   Final    BLOOD LEFT HAND Performed at [facility], [address] [place], [place] [address]    Special Requests   Final    BOTTLES DRAWN AEROBIC AND ANAEROBIC Blood Culture adequate volume Performed at [facility], [address] [place], [place] [address]    Culture  Setup Time   Final    GRAM POSITIVE COCCI IN CHAINS IN BOTH AEROBIC AND ANAEROBIC BOTTLES Gram Stain Report Called to,Read Back By and Verified With: JASON ROBBINS 08/12/21 @ 0507 BY  SB Performed at [facility], [address] [place], [place] [address]    Culture (A)  Final    ENTEROCOCCUS FAECALIS SUSCEPTIBILITIES PERFORMED ON PREVIOUS CULTURE WITHIN THE LAST 5 DAYS. Performed at [facility], [place] [address] [place], [place] [address]    Report Status 08/14/2021 FINAL  Final  Culture, blood (Routine X 2) w Reflex to ID Panel     Status: Abnormal   Collection Time: 08/11/21  5:47 PM   Specimen: BLOOD  Result Value Ref Range Status   Specimen Description   Final    BLOOD FOA Performed at [facility], [address], [place], [place] [address]    Special Requests   Final    BOTTLES DRAWN AEROBIC AND ANAEROBIC Blood Culture adequate volume Performed at [facility], [address] [place], [place] [address]    Culture  Setup Time   Final    GRAM POSITIVE COCCI IN CHAINS  IN BOTH AEROBIC AND ANAEROBIC BOTTLES Gram Stain Report Called to,Read Back By and Verified With: JASON ROBBINS 08/12/21 @ 0507 BY SB Performed at [facility], [place] [address] [place], [place] [address]    Culture ENTEROCOCCUS FAECALIS (A)  Final   Report Status 08/14/2021 FINAL  Final   Organism ID, Bacteria ENTEROCOCCUS FAECALIS  Final      Susceptibility   Enterococcus faecalis - MIC*    AMPICILLIN <=2 SENSITIVE Sensitive     VANCOMYCIN 2 SENSITIVE Sensitive     GENTAMICIN SYNERGY SENSITIVE Sensitive     * ENTEROCOCCUS FAECALIS  Blood Culture ID Panel (Reflexed)     Status: Abnormal   Collection Time: 08/11/21  5:47 PM  Result Value Ref Range Status   Enterococcus faecalis DETECTED (A) NOT DETECTED Final    Comment: CRITICAL RESULT CALLED TO, READ BACK BY AND VERIFIED WITH: JASON ROBBINS 08/12/21 @ 0507 BY SB    Enterococcus Faecium NOT DETECTED NOT DETECTED Final   Listeria monocytogenes NOT DETECTED NOT DETECTED Final   Staphylococcus species NOT DETECTED NOT DETECTED Final   Staphylococcus aureus (BCID) NOT DETECTED NOT DETECTED Final    Staphylococcus epidermidis NOT DETECTED NOT DETECTED Final   Staphylococcus lugdunensis NOT DETECTED NOT DETECTED Final   Streptococcus species NOT DETECTED NOT DETECTED Final   Streptococcus agalactiae NOT DETECTED NOT DETECTED Final   Streptococcus pneumoniae NOT DETECTED NOT DETECTED Final   Streptococcus pyogenes NOT DETECTED NOT DETECTED Final   A.calcoaceticus-baumannii NOT DETECTED NOT DETECTED Final   Bacteroides fragilis NOT DETECTED NOT DETECTED Final   Enterobacterales NOT DETECTED NOT DETECTED Final   Enterobacter cloacae complex NOT DETECTED NOT DETECTED Final   Escherichia coli NOT DETECTED NOT DETECTED Final   Klebsiella aerogenes NOT DETECTED NOT DETECTED Final   Klebsiella oxytoca NOT DETECTED NOT DETECTED Final   Klebsiella pneumoniae NOT DETECTED NOT DETECTED Final   Proteus species NOT DETECTED NOT DETECTED Final   Salmonella species NOT DETECTED NOT DETECTED Final   Serratia marcescens NOT DETECTED NOT DETECTED Final   Haemophilus influenzae NOT DETECTED NOT DETECTED Final   Neisseria meningitidis NOT DETECTED NOT DETECTED Final   Pseudomonas aeruginosa NOT DETECTED NOT DETECTED Final   Stenotrophomonas maltophilia NOT DETECTED NOT DETECTED Final   Candida albicans NOT DETECTED NOT DETECTED Final   Candida auris NOT DETECTED NOT DETECTED Final   Candida glabrata NOT DETECTED NOT DETECTED Final   Candida krusei NOT DETECTED NOT DETECTED Final   Candida parapsilosis NOT DETECTED NOT DETECTED Final   Candida tropicalis NOT DETECTED NOT DETECTED Final   Cryptococcus neoformans/gattii NOT DETECTED NOT DETECTED Final   Vancomycin resistance NOT DETECTED NOT DETECTED Final    Comment: Performed at [facility], [address] [place], [place] [address]  CULTURE, BLOOD (ROUTINE X 2) w Reflex to ID Panel     Status: None   Collection Time: 08/12/21 11:58 PM   Specimen: BLOOD  Result Value Ref Range Status   Specimen Description BLOOD BLOOD LEFT HAND  Final    Special Requests   Final    BOTTLES DRAWN AEROBIC AND ANAEROBIC Blood Culture adequate volume   Culture   Final    NO GROWTH 5 DAYS Performed at [facility], [address] [place], [place] [address]    Report Status 08/18/2021 FINAL  Final  CULTURE, BLOOD (ROUTINE X 2) w Reflex to ID Panel     Status: None   Collection Time: 08/12/21 11:58 PM   Specimen:  BLOOD  Result Value Ref Range Status   Specimen Description BLOOD LEFT RADIAL  Final   Special Requests   Final    BOTTLES DRAWN AEROBIC AND ANAEROBIC Blood Culture results may not be optimal due to an inadequate volume of blood received in culture bottles   Culture   Final    NO GROWTH 5 DAYS Performed at [facility], [address] [place], [place] [address]    Report Status 08/18/2021 FINAL  Final  Resp Panel by RT-PCR (Flu A&B, Covid) Nasopharyngeal Swab     Status: None   Collection Time: 08/21/21  9:50 AM   Specimen: Nasopharyngeal Swab; Nasopharyngeal(NP) swabs in vial transport medium  Result Value Ref Range Status   SARS Coronavirus 2 by RT PCR NEGATIVE NEGATIVE Final    Comment: (NOTE) SARS-CoV-2 target nucleic acids are NOT DETECTED.  The SARS-CoV-2 RNA is generally detectable in upper respiratory specimens during the acute phase of infection. The lowest concentration of SARS-CoV-2 viral copies this assay can detect is 138 copies/mL. A negative result does not preclude SARS-Cov-2 infection and should not be used as the sole basis for treatment or other patient management decisions. A negative result may occur with  improper specimen collection/handling, submission of specimen other than nasopharyngeal swab, presence of viral mutation(s) within the areas targeted by this assay, and inadequate number of viral copies(<138 copies/mL). A negative result must be combined with clinical observations, patient history, and epidemiological information. The expected result is Negative.  Fact Sheet for  Patients:  EntrepreneurPulse.com.au  Fact Sheet for Healthcare Providers:  IncredibleEmployment.be  This test is no t yet approved or cleared by the [place] FDA and  has been authorized for detection and/or diagnosis of SARS-CoV-2 by FDA under an Emergency Use Authorization (EUA). This EUA will remain  in effect (meaning this test can be used) for the duration of the COVID-19 declaration under Section 564(b)(1) of the Act, 21 U.S.C.section 360bbb-3(b)(1), unless the authorization is terminated  or revoked sooner.       Influenza A by PCR NEGATIVE NEGATIVE Final   Influenza B by PCR NEGATIVE NEGATIVE Final    Comment: (NOTE) The Xpert Xpress SARS-CoV-2/FLU/RSV plus assay is intended as an aid in the diagnosis of influenza from Nasopharyngeal swab specimens and should not be used as a sole basis for treatment. Nasal washings and aspirates are unacceptable for Xpert Xpress SARS-CoV-2/FLU/RSV testing.  Fact Sheet for Patients: EntrepreneurPulse.com.au  Fact Sheet for Healthcare Providers: IncredibleEmployment.be  This test is not yet approved or cleared by the [place] FDA and has been authorized for detection and/or diagnosis of SARS-CoV-2 by FDA under an Emergency Use Authorization (EUA). This EUA will remain in effect (meaning this test can be used) for the duration of the COVID-19 declaration under Section 564(b)(1) of the Act, 21 U.S.C. section 360bbb-3(b)(1), unless the authorization is terminated or revoked.  Performed at [facility], [address] [place],  [address]     Coagulation Studies: No results for input(s): LABPROT, INR in the last 72 hours.  Urinalysis: No results for input(s): COLORURINE, LABSPEC, PHURINE, GLUCOSEU, HGBUR, BILIRUBINUR, KETONESUR, PROTEINUR, UROBILINOGEN, NITRITE, LEUKOCYTESUR in the last 72 hours.  Invalid input(s): APPERANCEUR     Imaging: EEG adult  Result Date: 08/28/2021 Lora Havens, MD     08/28/2021  2:38 PM Patient Name: [name] [name] MRN: [id number] Epilepsy Attending: Lora Havens Referring Physician/Provider: Dr Kathie Dike Date: 08/28/2021 Duration: 29 mins Patient history: 60 yo M  with ams. EEG to evaluate for seizure Level of alertness: Awake AEDs during EEG study: None Technical aspects: This EEG study was done with scalp electrodes positioned according to the 10-20 International system of electrode placement. Electrical activity was acquired at a sampling rate of 500Hz  and reviewed with a high frequency filter of 70Hz  and a low frequency filter of 1Hz . EEG data were recorded continuously and digitally stored. Description: No posterior dominant rhythm was seen. EEG showed continuous generalized and lateralized right hemisphere 3 to 6 Hz theta-delta slowing. Physiologic photic driving was not seen during photic stimulation.  Hyperventilation was not performed.   ABNORMALITY - Continuous slow, generalized and lateralized right hemisphere IMPRESSION: This study is  suggestive of uggestive of cortical dysfunction arising from right hemisphere, nonspecific etiology but secondary to underlying structural abnormality. There is also moderat diffuse encephalopathy, nonspecific etiology. No seizures or epileptiform discharges were seen throughout the recording. Priyanka Barbra Sarks     Medications:    sodium chloride 250 mL (08/27/21 2052)   ampicillin (OMNIPEN) IV 2 g (08/28/21 2330)   cefTRIAXone (ROCEPHIN)  IV 2 g (08/29/21 0045)     apixaban  2.5 mg Oral BID   Chlorhexidine Gluconate Cloth  6 each Topical Q0600   epoetin (EPOGEN/PROCRIT) injection  4,000 Units Intravenous Q M,W,F-HD   feeding supplement (NEPRO CARB STEADY)  237 mL Oral TID BM   lidocaine  2 patch Transdermal Q24H   nicotine  21 mg Transdermal Daily   senna-docusate  1 tablet Oral BID   sevelamer carbonate  1,600 mg Oral TID WC    sodium chloride, acetaminophen, bisacodyl, hydrALAZINE, LORazepam, methocarbamol, Muscle Rub, ondansetron (ZOFRAN) IV, oxyCODONE-acetaminophen, polyethylene glycol, sodium chloride flush  Assessment/ Plan:  [name] [name] is a 60 y.o. black male with end stage renal disease on hemodialysis, hypertension, congestive heart failure, substance abuse who is admitted to [facility] on 08/08/2021 for Lower back pain [M54.50] Acute midline low back pain without sciatica [M54.50] At risk for inadequate pain control [Z91.89] Back pain [M54.9] Found to have eneterococcus faecalis bacteremia and osteomyelitis of C5-C6.   [facility] Right AVG Hero 90.5kg  #ESRD on HD TTS Due to patient being placed in rehab in [place], he will receive dialysis at [facility] on a TTS schedule. Scheduled to receive dialysis today. Next treatment scheduled for Thursday  #Hypotension BP 105/55   #Anemia of CKD Lab Results  Component Value Date   HGB 9.8 (L) 08/28/2021  Continue Low dose Epogen with HD treatments  #Secondary Hyperparathyroidism Lab Results  Component Value Date   PTH 583.2 (H) 12/11/2012   CALCIUM 10.2 08/28/2021   CAION 1.27 (H) 12/26/2012   PHOS 7.8 (H) 08/28/2021   Calcium at goal. Phosphorus remains elevated. Continue sevelamer with meals  #Osteomyelitis: C5-C6  and L4-L5  Ampicillin and Ceftriaxone prescribed Continue management per primary team.  #Altered Mental Status Reports of delirium, agitation overnight. Continue delirium precautions.    LOS: [place] 9/20/202210:27 AM

## 2021-08-30 ENCOUNTER — Inpatient Hospital Stay: Payer: Medicare Other

## 2021-08-30 DIAGNOSIS — I639 Cerebral infarction, unspecified: Secondary | ICD-10-CM | POA: Diagnosis not present

## 2021-08-30 DIAGNOSIS — R4182 Altered mental status, unspecified: Secondary | ICD-10-CM | POA: Diagnosis not present

## 2021-08-30 DIAGNOSIS — F1910 Other psychoactive substance abuse, uncomplicated: Secondary | ICD-10-CM | POA: Diagnosis not present

## 2021-08-30 DIAGNOSIS — G9341 Metabolic encephalopathy: Secondary | ICD-10-CM | POA: Diagnosis not present

## 2021-08-30 DIAGNOSIS — Z515 Encounter for palliative care: Secondary | ICD-10-CM

## 2021-08-30 DIAGNOSIS — I82C19 Acute embolism and thrombosis of unspecified internal jugular vein: Secondary | ICD-10-CM | POA: Diagnosis not present

## 2021-08-30 DIAGNOSIS — G9340 Encephalopathy, unspecified: Secondary | ICD-10-CM | POA: Diagnosis not present

## 2021-08-30 DIAGNOSIS — D631 Anemia in chronic kidney disease: Secondary | ICD-10-CM | POA: Diagnosis not present

## 2021-08-30 DIAGNOSIS — N186 End stage renal disease: Secondary | ICD-10-CM | POA: Diagnosis not present

## 2021-08-30 DIAGNOSIS — R569 Unspecified convulsions: Secondary | ICD-10-CM

## 2021-08-30 DIAGNOSIS — M5440 Lumbago with sciatica, unspecified side: Secondary | ICD-10-CM | POA: Diagnosis not present

## 2021-08-30 DIAGNOSIS — Z7189 Other specified counseling: Secondary | ICD-10-CM | POA: Diagnosis not present

## 2021-08-30 LAB — CBC
HCT: 30.8 % — ABNORMAL LOW (ref 39.0–52.0)
Hemoglobin: 9.5 g/dL — ABNORMAL LOW (ref 13.0–17.0)
MCH: 31.1 pg (ref 26.0–34.0)
MCHC: 30.8 g/dL (ref 30.0–36.0)
MCV: 101 fL — ABNORMAL HIGH (ref 80.0–100.0)
Platelets: 117 10*3/uL — ABNORMAL LOW (ref 150–400)
RBC: 3.05 MIL/uL — ABNORMAL LOW (ref 4.22–5.81)
RDW: 23.8 % — ABNORMAL HIGH (ref 11.5–15.5)
WBC: 9.4 10*3/uL (ref 4.0–10.5)
nRBC: 1.4 % — ABNORMAL HIGH (ref 0.0–0.2)

## 2021-08-30 LAB — BASIC METABOLIC PANEL
Anion gap: 15 (ref 5–15)
BUN: 56 mg/dL — ABNORMAL HIGH (ref 6–20)
CO2: 27 mmol/L (ref 22–32)
Calcium: 9.3 mg/dL (ref 8.9–10.3)
Chloride: 97 mmol/L — ABNORMAL LOW (ref 98–111)
Creatinine, Ser: 7.61 mg/dL — ABNORMAL HIGH (ref 0.61–1.24)
GFR, Estimated: 8 mL/min — ABNORMAL LOW (ref >60)
Glucose, Bld: 79 mg/dL (ref 70–99)
Potassium: 4.6 mmol/L (ref 3.5–5.1)
Sodium: 139 mmol/L (ref 135–145)

## 2021-08-30 LAB — GLUCOSE, CAPILLARY
Glucose-Capillary: 73 mg/dL (ref 70–99)
Glucose-Capillary: 83 mg/dL (ref 70–99)

## 2021-08-30 LAB — TROPONIN I (HIGH SENSITIVITY)
Troponin I (High Sensitivity): 437 ng/L (ref <18)
Troponin I (High Sensitivity): 374 ng/L (ref <18)

## 2021-08-30 LAB — MRSA NEXT GEN BY PCR, NASAL: MRSA by PCR Next Gen: NOT DETECTED

## 2021-08-30 MED ORDER — HYDROMORPHONE HCL 1 MG/ML IJ SOLN
1.0000 mg | INTRAMUSCULAR | Status: DC | PRN
  Administered 2021-08-30: 1 mg via INTRAVENOUS
  Filled 2021-08-30: qty 1

## 2021-08-30 MED ORDER — POLYVINYL ALCOHOL 1.4 % OP SOLN
1.0000 [drp] | Freq: Four times a day (QID) | OPHTHALMIC | Status: DC | PRN
  Filled 2021-08-30: qty 15

## 2021-08-30 MED ORDER — SODIUM CHLORIDE 0.9 % IV SOLN
2.0000 g | Freq: Two times a day (BID) | INTRAVENOUS | Status: DC
  Administered 2021-08-30 – 2021-08-31 (×3): 2 g via INTRAVENOUS
  Filled 2021-08-30: qty 2
  Filled 2021-08-30 (×4): qty 2000

## 2021-08-30 MED ORDER — ACETAMINOPHEN 325 MG PO TABS: 650.0000 mg | ORAL_TABLET | Freq: Four times a day (QID) | ORAL | Status: DC | PRN

## 2021-08-30 MED ORDER — SODIUM CHLORIDE 0.9 % IV SOLN
2.0000 g | Freq: Two times a day (BID) | INTRAVENOUS | Status: DC
  Administered 2021-08-30 – 2021-08-31 (×3): 2 g via INTRAVENOUS
  Filled 2021-08-30 (×3): qty 20
  Filled 2021-08-30: qty 2
  Filled 2021-08-30: qty 20

## 2021-08-30 MED ORDER — LORAZEPAM 2 MG/ML IJ SOLN
1.0000 mg | Freq: Four times a day (QID) | INTRAMUSCULAR | Status: DC | PRN
  Administered 2021-08-30: 1 mg via INTRAVENOUS
  Filled 2021-08-30: qty 1

## 2021-08-30 MED ORDER — ACETAMINOPHEN 650 MG RE SUPP: 650.0000 mg | Freq: Four times a day (QID) | RECTAL | Status: DC | PRN

## 2021-08-30 MED ORDER — HALOPERIDOL LACTATE 5 MG/ML IJ SOLN: 0.5000 mg | INTRAMUSCULAR | Status: DC | PRN

## 2021-08-30 MED ORDER — HALOPERIDOL 0.5 MG PO TABS
0.5000 mg | ORAL_TABLET | ORAL | Status: DC | PRN
  Filled 2021-08-30: qty 1

## 2021-08-30 MED ORDER — SEVELAMER CARBONATE 800 MG PO TABS: 800.0000 mg | ORAL_TABLET | Freq: Three times a day (TID) | ORAL | Status: DC

## 2021-08-30 MED ORDER — APIXABAN 2.5 MG PO TABS
2.5000 mg | ORAL_TABLET | Freq: Two times a day (BID) | ORAL | Status: DC
  Administered 2021-08-31: 2.5 mg via ORAL
  Filled 2021-08-30: qty 1

## 2021-08-30 MED ORDER — GLYCOPYRROLATE 0.2 MG/ML IJ SOLN: 0.4000 mg | INTRAMUSCULAR | Status: DC | PRN

## 2021-08-30 MED ORDER — BIOTENE DRY MOUTH MT LIQD: 15.0000 mL | OROMUCOSAL | Status: DC | PRN

## 2021-08-30 MED ORDER — HALOPERIDOL LACTATE 2 MG/ML PO CONC
0.5000 mg | ORAL | Status: DC | PRN
  Filled 2021-08-30: qty 0.3

## 2021-08-30 NOTE — Progress Notes (Signed)
Subjective: Level of alertness, interaction with staff and speech have improved since yesterday, but still with significant impairment. Was made DNR earlier today by parents.   Objective: Current vital signs: BP (!) 114/49 (BP Location: Left Arm)   Pulse 79   Temp 98.3 F (36.8 C) (Oral)   Resp 16   Ht 6\' 1"  (1.854 m)   Wt 91 kg   SpO2 100%   BMI 26.47 kg/m  Vital signs in last 24 hours: Temp:  [97 F (36.1 C)-98.3 F (36.8 C)] 98.3 F (36.8 C) (09/21 0644) Pulse Rate:  [69-88] 79 (09/21 0900) Resp:  [0-19] 16 (09/21 0900) BP: (90-139)/(42-70) 114/49 (09/21 0900) SpO2:  [93 %-100 %] 100 % (09/21 0900)  Intake/Output from previous day: 09/20 0701 - 09/21 0700 In: 2200 [IV Piggyback:2200] Out: 1500  Intake/Output this shift: No intake/output data recorded. Nutritional status:  Diet Order             DIET - DYS 1 Room service appropriate? Yes; Fluid consistency: Thin  Diet effective now           Diet - low sodium heart healthy                  HEENT: [place]/AT Lungs: Respirations unlabored Ext: No edema  Neurologic Exam: Ment: Awake and staring directly ahead initially at start of exam, with head turned to the right. Has intermittent twitching of his brow that is non-rhythmic. Increased latencies of responses to stimuli. Did answer with slow, dysarthric speech correctly his first name and last name, as well as the name of his cousin at the bedside. All of his speech is with one-word utterances only. Has difficulty following commands but did squeeze hands to command. Did start to tear up and started to cry when his condition was discussed with his cousin at the bedside.  CN: Mildly dysconjugate gaze. Has difficulty tracking. Intermittent brow twitching noted. Labial fissure and perioral muscles contract symmetrically, but did not follow commands to smile.  Motor: Some spontaneous movements of BUE. Will resist examiner slightly with upper extremities but has difficulty  with commands. Moves BLE intermittently as he shifts them in the bed; no asymmetry of lower extremity movements noted.  Sensory: Reacts to plantar irritative stimuli bilaterally.  Reflexes: Symmetric Cerebellar/Gait: Unable to assess.   Lab Results: Results for orders placed or performed during the hospital encounter of 08/08/21 (from the past 48 hour(s))  Blood gas, arterial     Status: Abnormal   Collection Time: 08/28/21  6:18 PM  Result Value Ref Range   FIO2 0.28    Delivery systems NASAL CANNULA    pH, Arterial 7.30 (L) 7.350 - 7.450   pCO2 arterial 48 32.0 - 48.0 mmHg   pO2, Arterial 124 (H) 83.0 - 108.0 mmHg   Bicarbonate 23.6 20.0 - 28.0 mmol/L   Acid-base deficit 2.9 (H) 0.0 - 2.0 mmol/L   O2 Saturation 98.4 %   Patient temperature 37.0    Collection site LEFT BRACHIAL    Sample type ARTERIAL DRAW    Allens test (pass/fail) PASS PASS    Comment: Performed at [facility], [address] [place], [place] [address]  Glucose, capillary     Status: None   Collection Time: 08/29/21  8:04 PM  Result Value Ref Range   Glucose-Capillary 99 70 - 99 mg/dL    Comment: Glucose reference range applies only to samples taken after fasting for at least 8 hours.  CBC  Status: Abnormal   Collection Time: 08/30/21  4:20 AM  Result Value Ref Range   WBC 9.4 4.0 - 10.5 K/uL   RBC 3.05 (L) 4.22 - 5.81 MIL/uL   Hemoglobin 9.5 (L) 13.0 - 17.0 g/dL   HCT 30.8 (L) 39.0 - 52.0 %   MCV 101.0 (H) 80.0 - 100.0 fL   MCH 31.1 26.0 - 34.0 pg   MCHC 30.8 30.0 - 36.0 g/dL   RDW 23.8 (H) 11.5 - 15.5 %   Platelets 117 (L) 150 - 400 K/uL    Comment: Immature Platelet Fraction may be clinically indicated, consider ordering this additional test [id number] CONSISTENT WITH PREVIOUS RESULT    nRBC 1.4 (H) 0.0 - 0.2 %    Comment: Performed at [facility], [address], [place], [place] [address]  Basic metabolic panel     Status: Abnormal   Collection Time: 08/30/21  4:20 AM   Result Value Ref Range   Sodium 139 135 - 145 mmol/L   Potassium 4.6 3.5 - 5.1 mmol/L   Chloride 97 (L) 98 - 111 mmol/L   CO2 27 22 - 32 mmol/L   Glucose, Bld 79 70 - 99 mg/dL    Comment: Glucose reference range applies only to samples taken after fasting for at least 8 hours.   BUN 56 (H) 6 - 20 mg/dL   Creatinine, Ser 7.61 (H) 0.61 - 1.24 mg/dL   Calcium 9.3 8.9 - 10.3 mg/dL   GFR, Estimated 8 (L) >60 mL/min    Comment: (NOTE) Calculated using the CKD-EPI Creatinine Equation (2021)    Anion gap 15 5 - 15    Comment: Performed at [facility], [address] [place], [place] [address]  Glucose, capillary     Status: None   Collection Time: 08/30/21  6:07 AM  Result Value Ref Range   Glucose-Capillary 73 70 - 99 mg/dL    Comment: Glucose reference range applies only to samples taken after fasting for at least 8 hours.  Troponin I (High Sensitivity)     Status: Abnormal   Collection Time: 08/30/21  8:20 AM  Result Value Ref Range   Troponin I (High Sensitivity) 437 (HH) <18 ng/L    Comment: CRITICAL RESULT CALLED TO, READ BACK BY AND VERIFIED WITH SHANNON STARKS  08/30/21 @ 0915 BY SB. (NOTE) Elevated high sensitivity troponin I (hsTnI) values and significant  changes across serial measurements may suggest ACS but many other  chronic and acute conditions are known to elevate hsTnI results.  Refer to the "Links" section for chest pain algorithms and additional  guidance. Performed at [facility], [address], [place], [place] [address]     Recent Results (from the past 240 hour(s))  Resp Panel by RT-PCR (Flu A&B, Covid) Nasopharyngeal Swab     Status: None   Collection Time: 08/21/21  9:50 AM   Specimen: Nasopharyngeal Swab; Nasopharyngeal(NP) swabs in vial transport medium  Result Value Ref Range Status   SARS Coronavirus 2 by RT PCR NEGATIVE NEGATIVE Final    Comment: (NOTE) SARS-CoV-2 target nucleic acids are NOT DETECTED.  The SARS-CoV-2 RNA  is generally detectable in upper respiratory specimens during the acute phase of infection. The lowest concentration of SARS-CoV-2 viral copies this assay can detect is 138 copies/mL. A negative result does not preclude SARS-Cov-2 infection and should not be used as the sole basis for treatment or other patient management decisions. A negative result may occur with  improper specimen  collection/handling, submission of specimen other than nasopharyngeal swab, presence of viral mutation(s) within the areas targeted by this assay, and inadequate number of viral copies(<138 copies/mL). A negative result must be combined with clinical observations, patient history, and epidemiological information. The expected result is Negative.  Fact Sheet for Patients:  EntrepreneurPulse.com.au  Fact Sheet for Healthcare Providers:  IncredibleEmployment.be  This test is no t yet approved or cleared by the [place] FDA and  has been authorized for detection and/or diagnosis of SARS-CoV-2 by FDA under an Emergency Use Authorization (EUA). This EUA will remain  in effect (meaning this test can be used) for the duration of the COVID-19 declaration under Section 564(b)(1) of the Act, 21 U.S.C.section 360bbb-3(b)(1), unless the authorization is terminated  or revoked sooner.       Influenza A by PCR NEGATIVE NEGATIVE Final   Influenza B by PCR NEGATIVE NEGATIVE Final    Comment: (NOTE) The Xpert Xpress SARS-CoV-2/FLU/RSV plus assay is intended as an aid in the diagnosis of influenza from Nasopharyngeal swab specimens and should not be used as a sole basis for treatment. Nasal washings and aspirates are unacceptable for Xpert Xpress SARS-CoV-2/FLU/RSV testing.  Fact Sheet for Patients: EntrepreneurPulse.com.au  Fact Sheet for Healthcare Providers: IncredibleEmployment.be  This test is not yet approved or cleared by the  [place] FDA and has been authorized for detection and/or diagnosis of SARS-CoV-2 by FDA under an Emergency Use Authorization (EUA). This EUA will remain in effect (meaning this test can be used) for the duration of the COVID-19 declaration under Section 564(b)(1) of the Act, 21 U.S.C. section 360bbb-3(b)(1), unless the authorization is terminated or revoked.  Performed at [facility], [address] [place], [place] [address]     Lipid Panel No results for input(s): CHOL, TRIG, HDL, CHOLHDL, VLDL, LDLCALC in the last 72 hours.  Studies/Results: CT HEAD WO CONTRAST (5MM)  Result Date: 08/29/2021 CLINICAL DATA:  Neuro deficit, stroke suspected EXAM: CT HEAD WITHOUT CONTRAST TECHNIQUE: Contiguous axial images were obtained from the base of the skull through the vertex without intravenous contrast. COMPARISON:  08/25/2021 FINDINGS: Brain: No evidence of acute infarction, hemorrhage, hydrocephalus, extra-axial collection or mass lesion/mass effect. Vascular: No hyperdense vessel. Skull: Negative for fracture or focal lesion. Sinuses/Orbits: No acute finding. Other: The mastoids are well aerated. IMPRESSION: No acute intracranial process. Electronically Signed   By: Merilyn Baba M.D.   On: 08/29/2021 21:47   IR Fluoro Guide CV Line Left  Result Date: 08/29/2021 INDICATION: 59 year old gentleman who requires long-term central venous access for antibiotic therapy returns to IR for placement of retracted left power line. Previous placement was quite difficult due to extensive central venous stenosis. Catheter tip was left in azygous vein. EXAM: Fluoroscopy guided replacement of retracted power line MEDICATIONS: None ANESTHESIA/SEDATION: None FLUOROSCOPY TIME:  Fluoroscopy Time: 1 minutes 42 seconds (13.2 mGy). COMPLICATIONS: None immediate. PROCEDURE: Informed written consent was obtained from the patient's family after a thorough discussion of the procedural risks, benefits and  alternatives. All questions were addressed. Maximal Sterile Barrier Technique was utilized including caps, mask, sterile gowns, sterile gloves, sterile drape, hand hygiene and skin antiseptic. A timeout was performed prior to the initiation of the procedure. Patient positioned supine on the procedure table. The external segment of the retracted left anterior power line and surrounding skin prepped and draped in usual fashion. Scout image demonstrated the catheter tip to be slightly retracted. The catheter did aspirate after removing the retention suture. Given that the cuff  was already exposed, decision was made to replace the single-lumen power line. The existing power line was removed over 0.035 inch stiff Glidewire. New power line was cut to same length of 34 cm and inserted over the stiff glidewire. The tip was positioned in the azygous vein at the level of the mid chest. The catheter aspirated and flushed well. The catheter was secured to skin with silk suture and covered with bio patch and sterile dressing. IMPRESSION: Successful exchange of retracted left IJ power line. New Powerline ready for use. Patient has extensive central venous occlusion and the only central venous structure amenable to catheter placement was the azygous vein. If this catheter becomes nonfunctional or is retracted , contrast-enhanced chest CT should be performed to determine degree of venous stenosis. If clinically warranted, venous recanalization may be considered. A venous recanalization is not possible, and the patient still requires central venous access, femoral approach central venous catheter should be considered. Electronically Signed   By: Miachel Roux M.D.   On: 08/29/2021 16:20   DG Chest Port 1 View  Result Date: 08/30/2021 CLINICAL DATA:  Shortness of breath EXAM: PORTABLE CHEST 1 VIEW COMPARISON:  08/29/2021 FINDINGS: There is a large bore central venous catheter with tip in the inferior right atrium. Again seen is a  left IJ catheter. The distal portion of the catheter has an unusual orientation consistent with azygous vein placement. Stable cardiac enlargement and aortic atherosclerosis. Again seen is volume loss throughout the right hemithorax with diffuse pleural thickening and calcification. Superimposed pulmonary vascular congestion noted. Atelectasis is identified within the lung bases. IMPRESSION: 1. Cardiac enlargement and pulmonary vascular congestion. 2. Persistent volume loss throughout the right hemithorax with diffuse pleural thickening and calcification. 3. Bibasilar atelectasis and probable small right pleural effusion. Electronically Signed   By: Kerby Moors M.D.   On: 08/30/2021 07:29   DG Chest Port 1 View  Result Date: 08/29/2021 CLINICAL DATA:  Post PICC line placement in a 60 year old male. EXAM: PORTABLE CHEST 1 VIEW COMPARISON:  08/11/2021. FINDINGS: Dialysis catheter again terminates in the mid to lower RIGHT atrium. Interval placement of a LEFT-sided central venous access device, tip orientation suggest placement within the azygous vein. Correlation with recent imaging report from placement states this is intended site of this catheter. Cardiomediastinal contours with stable cardiac enlargement. Pleural calcification and pleural fluid in the RIGHT chest with similar appearance. No signs of new lobar consolidation or pneumothorax. Signs of renal osteodystrophy without acute skeletal process on limited assessment. Evidence of prior trauma to RIGHT ribs as before. IMPRESSION: Dialysis catheter again terminates in the mid to lower RIGHT atrium. Interval placement of a LEFT-sided central venous access device, tip orientation suggests placement within the azygous vein. This appears to be the intended placement due to central venous occlusion involving distal SVC as outlined in the previous interventional radiology report. Otherwise stable appearance of the chest with chronic volume loss and  pleuroparenchymal scarring/calcification in the RIGHT chest. Electronically Signed   By: Zetta Bills M.D.   On: 08/29/2021 10:32   EEG adult  Result Date: 08/28/2021 Lora Havens, MD     08/28/2021  2:38 PM Patient Name: CLAUDIO MONDRY MRN: [id number] Epilepsy Attending: Lora Havens Referring Physician/Provider: Dr Kathie Dike Date: 08/28/2021 Duration: 29 mins Patient history: 60 yo M with ams. EEG to evaluate for seizure Level of alertness: Awake AEDs during EEG study: None Technical aspects: This EEG study was done with scalp electrodes positioned according to the  10-20 International system of electrode placement. Electrical activity was acquired at a sampling rate of 500Hz  and reviewed with a high frequency filter of 70Hz  and a low frequency filter of 1Hz . EEG data were recorded continuously and digitally stored. Description: No posterior dominant rhythm was seen. EEG showed continuous generalized and lateralized right hemisphere 3 to 6 Hz theta-delta slowing. Physiologic photic driving was not seen during photic stimulation.  Hyperventilation was not performed.   ABNORMALITY - Continuous slow, generalized and lateralized right hemisphere IMPRESSION: This study is  suggestive of uggestive of cortical dysfunction arising from right hemisphere, nonspecific etiology but secondary to underlying structural abnormality. There is also moderat diffuse encephalopathy, nonspecific etiology. No seizures or epileptiform discharges were seen throughout the recording. Priyanka Barbra Sarks    Medications: Scheduled:  apixaban  2.5 mg Oral BID   Chlorhexidine Gluconate Cloth  6 each Topical Q0600   epoetin (EPOGEN/PROCRIT) injection  4,000 Units Intravenous Q M,W,F-HD   feeding supplement (NEPRO CARB STEADY)  237 mL Oral TID BM   lidocaine  2 patch Transdermal Q24H   nicotine  21 mg Transdermal Daily   phenytoin (DILANTIN) IV  100 mg Intravenous Q8H   senna-docusate  1 tablet Oral BID   sevelamer  carbonate  1,600 mg Oral TID WC   Continuous:  sodium chloride 250 mL (08/27/21 2052)   ampicillin (OMNIPEN) IV 2 g (08/29/21 2333)   cefTRIAXone (ROCEPHIN)  IV 2 g (08/29/21 2329)    Assessment: 60 yo with ESRD, HTN, tobacco and poly substance abuse presented with worsening lower back pain. Found to have sepsis 2/2 e. 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 Neurology consulted for possible punctate acute/subacute infarcts in L temporal lobe and R insula. Also with waxing and waning mentation, which improved after fosphenytoin load yesterday; subclinical seizures, possibly recurrent focal unaware seizures are now a more prominent component of the DDx.  - Exam this evening (9/21) reveals significant improvement relative to yesterday, but still with significant impairment.   - Punctate infarcts on MRI. They are favored to be incidental given that they are extremely small. Stroke workup is completed. 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 - Encephalopathy. Sepsis is felt to be the major contributing factor to his encephalopathy. Routine EEG does show continuous generalized slowing, consistent with an encephalopathy. However, the EEG Monday also revealed a lateralized component to the slowing, involving his right cerebral hemisphere. This would not be due to his strokes, which are too small to significantly impact the EEG. There is no large area of encephalomalacia in his right hemisphere to account for this either. DDx for the focal slowing includes focal subclinical seizure activity.   - Event yesterday (9/20) suggestive of seizure: After returning to the floor from dialysis yesterday night, the patient was with decreased responsiveness to questions and commands, but with eyes were open and he was localizing as well as groaning to sternal rub. Overall appeared suggestive of a focal  unaware seizure. STAT CT was negative for acute abnormality. He was loaded with fosphenytoin after CT. Regarding possible alternative IV anticonvulsants, Keppra not an optimal choice given ESRD. Valproic not a good choice given national shortage of IV formulation.  - EEG from today (9/21): This EEG was obtained while awake and asleep and is abnormal due to moderate diffuse slowing, indicative of global cerebral dysfunction.    Receommendations: - Agree with continuation of  Eliquis  - Continue scheduled Dilantin IV 100 mg TID. - Will consider addition of gabapentin, phenobarbital or valproic acid (all are synergistic with phenytoin) to his regimen if he is not continuing to improve by tomorrow AM. If VPA is added, will need to obtain frequent levels of both VPA and PHT as they affect each other's levels unpredictably (one is an enzyme inhibitor, the other an inducer) - Dilantin level has been reordered and is pending.   - Would benefit from LTM EEG, which is not available at [facility] - Parents stated that they are consenting to have the patient transferred to [facility] for LTM EEG - Initial process of arranging for transfer to [facility] has been initiated by ICU team at [facility], but may not be transferred overnight. Family wants full medical care in the context of patient being DNR.   45 minutes spent in the neurological evaluation and management of this critically ill patient. Time spent included extensive discussion with family of the patient's condition and prognosis, consisting of > 50% of total time at the bedside.      LOS: 21 days   @Electronically  signed: Dr. Kerney Elbe 08/30/2021  10:32 AM

## 2021-08-30 NOTE — Progress Notes (Addendum)
Patient off unit in dialysis at shift change. Report received from dialysis nurse that patient was stable with 1500 liters fluid removed. She reported that a Palliative Team provider came by to visit with patient during procedure. Upon arrival back to unit patient noted lethargic and hard to arouse, ICU charge nurse Jinny Blossom and MD called to bedside. Orders received for stat head CT. Orders provided to initiate anti-seizure medication per MD. Vitals were stable and no respiratory distress on 3 liters oxygen. Patient noted lethargic and not responsive to painful stimuli with morning rounds, ICU nurse Alver Fisher and Judd Gaudier, MD called to bedside. Due to patients decline in status decision was made to transport patient to ICU for care. Bedside report provided to nursing staff on ICU. Patients parents notified of transfer and current status.

## 2021-08-30 NOTE — Consult Note (Addendum)
Consultation Note Date: 08/30/2021   Patient Name: [name] [name]  DOB: [birth year]  MRN: [id number]  Age / Sex: 60 y.o., male  PCP: Pcp, No Referring Physician: Richarda Osmond, MD  Reason for Consultation: Establishing goals of care  HPI/Patient Profile: 60 y.o. male  with past medical history of ESRD on HD MWF, hypertension, tobacco abuse, polysubstance abuse, diverticulitis, anemia, jugular vein thrombosis, GI bleeding, right pneumothorax admitted on 08/08/2021 with lower back pain and found bacteremia, endocarditis, discitis. Prolonged hospitalization complicated by ongoing encephalopathy with fluctuating agitation and lethargy. Treated for potential Wernike's encephalopathy. MRI with possible small acute infarcts. Concern for potential seizure activity.   Clinical Assessment and Goals of Care: I met today at Johnel's bedside. He has eyes open and is tracking. He is able to slightly move his hand and feet but only able to lift his arm a few inches before it falls to bed. He appears to want to say something but unable to verbalize anything at all. He is unable to have any conversation.   I called and spoke with Elnathan's mother and father. They both tell me that they have had conversations over the days with Dr. Roderic Palau and are aware of the concerns with their son's declining mental state. RN Chaya Jan called and updated them this morning and they appreciated this call as well. They report that they have avoided coming to the hospital to visit as their have health issues and risks themselves but they did come see Tremond today. I asked them what they felt and thought after seeing him themselves. They tearfully shares that they feel he is on his journey to end of life. We reviewed Jarrick's life and the suffering he experienced even prior to this hospitalization. He had experienced the death of his only child. His parents  describe pain and sleeping for many hours after each dialysis session being difficult for him.   I spoke further with his parents about goals of care at this stage of Braxtin's illness. I discussed with them that it would not be unreasonable to consider comfort focused care for [facility] considering his level of suffering. After further discussion they have made the difficult decision to focus on comfort care. They do not wish to pursue interventions that may continue to contribute to Burnett's suffering. We also agreed we do not want interventions that will prolong his suffering. I encouraged that they should notify everyone who might want to visit and spend time with Cahlil as soon as possible. We discussed that anything could happen at anytime.   All questions/concerns addressed. Emotional support provided. I updated Dr. Ouida Sills, Dr. Candiss Norse, and RN Larene Beach.   Primary Decision Maker NEXT OF KIN parents    SUMMARY OF RECOMMENDATIONS   - Full comfort care decided today  Code Status/Advance Care Planning: DNR   Symptom Management:  Comfort medications added as needed.   Palliative Prophylaxis:  Aspiration, Delirium Protocol, Frequent Pain Assessment, Oral Care, and Turn Reposition  Additional Recommendations (Limitations, Scope, Preferences): Full Comfort Care  Psycho-social/Spiritual:  Desire for further Chaplaincy support:yes Additional Recommendations: Education on Hospice and Grief/Bereavement Support  Prognosis:  < 2 weeks  Discharge Planning: May consider hospice facility (I have not discussed with family) vs hospital death     Primary Diagnoses: Present on Admission:  Tobacco abuse  Anemia in ESRD (end-stage renal disease) ([place])  Lower back pain  Polysubstance abuse ([place])  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 The  following aspects are pertinent.  Past Medical History:  Diagnosis Date   Anemia of chronic disease    BL Hgb ~10   CHF (congestive heart failure) ([place])    pt denies 04/01/14   Diverticulitis    End-stage renal disease on hemodialysis (HCC)    HD m/w/f - right arm graft   History of blood transfusion    Hypertension    Pneumonia    hx of   Pneumonia    Polysubstance abuse ([place])    Renal insufficiency    Secondary hyperparathyroidism ([place])    Sleep disturbance    Tobacco abuse    Social History   Socioeconomic History   Marital status: Single    Spouse name: Not on file   Number of children: 2   Years of education: 12   Highest education level: Not on file  Occupational History   Occupation: disabled    Comment: previously drove trucks  Tobacco Use   Smoking status: Every Day    Packs/day: 6.00    Years: 20.00    Pack years: 120.00    Types: Cigarettes   Smokeless tobacco: Current  Vaping Use   Vaping Use: Never used  Substance and Sexual Activity   Alcohol use: Yes    Alcohol/week: 3.0 standard drinks    Types: 3 Cans of beer per week    Comment: occasional   Drug use: Yes    Types: Marijuana, Cocaine    Comment: remote cocaine abuse, ongoing marijuana use.   Sexual activity: Not on file  Other Topics Concern   Not on file  Social History Narrative   Not on file   Social Determinants of Health   Financial Resource Strain: Not on file  Food Insecurity: Not on file  Transportation Needs: Not on file  Physical Activity: Not on file  Stress: Not on file  Social Connections: Not on file   Family History  Problem Relation Age of Onset   Thyroid disease Mother    Renal Disease Maternal Grandmother    Diabetes Maternal Grandmother    Hypertension Maternal Aunt    Hypertension Maternal Uncle    Scheduled Meds:  apixaban  2.5 mg Oral BID   Chlorhexidine Gluconate Cloth  6 each Topical Q0600   epoetin (EPOGEN/PROCRIT) injection  4,000 Units Intravenous  Q M,W,F-HD   feeding supplement (NEPRO CARB STEADY)  237 mL Oral TID BM   lidocaine  2 patch Transdermal Q24H   nicotine  21 mg Transdermal Daily   phenytoin (DILANTIN) IV  100 mg Intravenous Q8H   senna-docusate  1 tablet Oral BID   sevelamer carbonate  1,600 mg Oral TID WC   Continuous Infusions:  sodium chloride 250 mL (08/27/21 2052)   ampicillin (OMNIPEN) IV 2 g (08/29/21 2333)   cefTRIAXone (ROCEPHIN)  IV 2 g (08/30/21 1115)   PRN Meds:.sodium chloride, acetaminophen, bisacodyl, hydrALAZINE, iohexol, LORazepam, methocarbamol, Muscle  Rub, ondansetron (ZOFRAN) IV, oxyCODONE-acetaminophen, polyethylene glycol, sodium chloride flush Allergies  Allergen Reactions   Other     Other reaction(s): Other (See Comments) blister   Tape Other (See Comments)    Plastic Tape Causes blister   Review of Systems  Unable to perform ROS: Acuity of condition   Physical Exam Vitals and nursing note reviewed.  Constitutional:      General: He is not in acute distress.    Appearance: He is ill-appearing.  Cardiovascular:     Rate and Rhythm: Normal rate.  Pulmonary:     Effort: No tachypnea, accessory muscle usage or respiratory distress.  Abdominal:     Palpations: Abdomen is soft.  Neurological:     Comments: Nonverbal     Vital Signs: BP (!) 114/49 (BP Location: Left Arm)   Pulse 79   Temp 98.3 F (36.8 C) (Oral)   Resp 16   Ht _0  (1.854 m)   Wt 91 kg   SpO2 100%   BMI 26.47 kg/m  Pain Scale: PAINAD POSS *See Group Information*: 1-Acceptable,Awake and alert Pain Score: Asleep   SpO2: SpO2: 100 % O2 Device:SpO2: 100 % O2 Flow Rate: .O2 Flow Rate (L/min): 3 L/min  IO: Intake/output summary:  Intake/Output Summary (Last 24 hours) at 08/30/2021 1119 Last data filed at 08/29/2021 2333 Gross per 24 hour  Intake 2200 ml  Output 1500 ml  Net 700 ml    LBM: Last BM Date: 08/29/21 Baseline Weight: Weight: 91 kg Most recent weight: Weight: 91 kg     Palliative  Assessment/Data:     Time Total: 50 min  Greater than 50%  of this time was spent counseling and coordinating care related to the above assessment and plan.  Signed by: Vinie Sill, NP Palliative Medicine Team Pager # [phone] (M-F 8a-5p) Team Phone # [phone] (Nights/Weekends)

## 2021-08-30 NOTE — Progress Notes (Signed)
Eeg done 

## 2021-08-30 NOTE — Progress Notes (Signed)
Cross Cover Per Dr, Cheral Marker (at bedside), generalized slowing on EEG likely due to seizures and not his stroke and he noted improvement in neuro exam after starting dilantin ad would benefit from LTM EEG, and patient would required transfer to [facility] for procedure. Per him and ocusin at bedside, parents are in agreement with plan I further discussed with parents over the phone there wishes for continued treament.  They ultimately decided patient would remain a DNR but would like to restart medical management of current conditions.  Also agreed to have temporary NG/DHT for nutrition and meds if it could be placed without discomfort for patient. His antibiotics and antiseizure meds, sevelamer and eliquis have been resumed.  Attempt to place NGT was made, however procedure extremely agitated patient and effort stopped.  [facility] notified of transfer request.  No stepdown neuro beds available at this timeAttempted to discusse transfer with Dr Renetta Chalk who reported he was not covering [place].  Reattempt transfer in AM 9/22

## 2021-08-30 NOTE — Progress Notes (Signed)
Attempted to place NGT with Tess, RN. Pt did not tolerate NGT placement. Pt unable to follow commands so unable to swallow on demand. Pt began to desat and was biting down on tongue causing his mouth to fill with blood. Pt suctioned and oral care performed. Hassan Rowan, NP notified of unsuccessful attempt.

## 2021-08-30 NOTE — Progress Notes (Signed)
OT Cancellation Note  Patient Details Name: ZECHARIAH BISSONNETTE MRN: [id number] DOB: 1960-12-23   Cancelled Treatment:    Reason Eval/Treat Not Completed: Medical issues which prohibited therapy Pt transferred to ICU last night following dialysis tx d/t decreased responsiveness with rapid response called. Stat head CT was ordered and reveals "No acute intracranial process". OT orders not continued upon transfer to the ICU. Will require C@T  orders or new orders to resume OT services d/t transfer to higher level of care. Will await MD guidance;  should no order changes be placed by end of therapy work day, will delete current order. Thank you.  Gerrianne Scale, [place], OTR/L ascom [phone] 08/30/21, 4:27 PM

## 2021-08-30 NOTE — Progress Notes (Signed)
Palliative:  Full consult note to follow. I have spoken with both of [name] [name]'s parents. They have had discussions with Dr. Roderic Palau over past few days. They have seen Cecilie Lowers today and admit that they feel he is on his journey at end of life. After our discussion they have made the difficult decision to shift our focus to keeping [name] [name] comfortable and not prolonging his suffering. Full comfort care at this time.   Vinie Sill, NP Palliative Medicine Team Pager [phone] (Please see [email or web address] for schedule) Team Phone [phone]

## 2021-08-30 NOTE — Procedures (Addendum)
Routine EEG Report  [name] [name] is a 60 y.o. male with a history of altered mental status who is undergoing an EEG to evaluate for seizures.  Report: This EEG was acquired with electrodes placed according to the International 10-20 electrode system (including Fp1, Fp2, F3, F4, C3, C4, P3, P4, O1, O2, T3, T4, T5, T6, A1, A2, Fz, Cz, Pz). The following electrodes were missing or displaced: none.  The occipital dominant rhythm was 5-7 Hz. This activity is reactive to stimulation. Drowsiness was manifested by background fragmentation; deeper stages of sleep were manifested by K complexes and sleep spindles. There was no focal slowing. There were no interictal epileptiform discharges. There were no electrographic seizures identified. Photic stimulation and hyperventilation were not performed.   Impression and clinical correlation: This EEG was obtained while awake and asleep and is abnormal due to moderate diffuse slowing, indicative of global cerebral dysfunction.  Su Monks, MD [facility] [phone]  If 7pm- 7am, please page neurology on call as listed in [place].

## 2021-08-30 NOTE — Progress Notes (Addendum)
Rapid response PROGRESS NOTE    [name] [name]  WNI:[id number] DOB: 08-Jul-1961 DOA: 08/08/2021 PCP: Pcp, No   Events: Rapid response called for patient difficult to arouse with noxious stimuli.  Uncertain last known normal.  This is in the setting of evaluation by neurology about 12 hours prior for similar episode for which she was started on fosphenytoin for possible seizure.  Patient had no recent administration of sedative meds or pain meds  Brief Narrative:  Patient hospitalized for the past 21 days with history of ESRD on HD, jugular vein thrombosis, treated for E faecalis bacteremia, discitis, aortic valve endocarditis with plans for discharge to SNF, delayed due to altered mental status On 9/20 due to fluctuating mental status MRI was performed that revealed acute infarcts and neurology was consulted.  Neurology reported that patient had an awake unresponsive episode after dialysis on 9/20 possibly due to seizure and he was started on fosphenytoin.  Rapid initial assessment Upon my arrival rapid response nurse was at bedside.  Vitals reviewed and were reassuring.  O2 sat 100% on 5 L Patient deeply somnolent initially not arousing with sternal rub however shortly after my arrival patient suddenly opened his eyes and looked around the room but would not answer questions and then he shortly fell back asleep.  Vitals remained stable during this time  Assessment & Plan:   Episode of unresponsiveness - Etiology uncertain, possibly postictal - Continue fosphenytoin per earlier ordered by neurologist - We will get chest x-ray to evaluate for aspiration during episode of unresponsiveness as patient is on 5 L -EKG ordered - Patient transferred to stepdown  Abnormal EKG - EKG with possible recent anteroseptal infarct - We will follow-up with troponins - Cardiology consult  Will sign out to incoming team due to change of shift   Principal Problem:   Lower back pain Active  Problems:   End-stage renal disease on hemodialysis ([place])   Polysubstance abuse ([place])   Tobacco abuse   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 ([place])   Anemia in ESRD (end-stage renal disease) ([place])   HTN (hypertension)   Back pain  Objective: Vitals:   08/29/21 2013 08/29/21 2047 08/29/21 2116 08/30/21 0445  BP: (!) 129/55 (!) 112/48 (!) 132/57 (!) 98/46  Pulse:   88 73  Resp: 13 (!) 0 16 16  Temp:   98.2 F (36.8 C) 98 F (36.7 C)  TempSrc:   Oral Oral  SpO2:   100% 93%  Weight:      Height:        Intake/Output Summary (Last 24 hours) at 08/30/2021 0641 Last data filed at 08/29/2021 2333 Gross per 24 hour  Intake 2200 ml  Output 1500 ml  Net 700 ml   Filed Weights   08/08/21 0625 08/16/21 0754  Weight: 91 kg 91 kg    Examination:  General exam: Somnolent, snoring respiration Respiratory system: Clear to auscultation. Respiratory effort normal. Cardiovascular system: S1 & S2 heard, RRR. No JVD, murmurs, rubs, gallops or clicks. No pedal edema. Gastrointestinal system: Abdomen is nondistended, soft and nontender. No organomegaly or masses felt. Normal bowel sounds heard. Central nervous system: Somnolent.  No focal deficit    Data Reviewed: I have personally reviewed following labs and imaging studies  CBC: Recent Labs  Lab 08/24/21 0503 08/25/21 0605 08/26/21 0525 08/28/21 0437 08/30/21 0420  WBC 12.5* 11.5* 9.7 10.6* 9.4  HGB 9.7* 9.3* 9.4* 9.8* 9.5*  HCT 30.1* 28.8* 30.1*  31.1* 30.8*  MCV 98.4 96.6 99.0 100.0 101.0*  PLT 253 234 190 137* 254*   Basic Metabolic Panel: Recent Labs  Lab 08/24/21 0503 08/25/21 0605 08/26/21 0525 08/28/21 0437 08/30/21 0420  NA 139 137 138 138 139  K 4.8 4.8 3.8 4.6 4.6  CL 95* 98 97* 97* 97*  CO2 28 28 29 23 27   GLUCOSE 81 85 120* 85 79  BUN 55* 73* 50* 71* 56*  CREATININE 7.13* 8.44* 6.84* 9.96* 7.61*  CALCIUM 10.0 9.8 9.7 10.2 9.3  PHOS 8.0*  --   --  7.8*  --    GFR: Estimated Creatinine  Clearance: 12.1 mL/min (A) (by C-G formula based on SCr of 7.61 mg/dL (H)). Liver Function Tests: Recent Labs  Lab 08/24/21 0503 08/26/21 0525 08/28/21 0437  AST  --  30  --   ALT  --  19  --   ALKPHOS  --  72  --   BILITOT  --  0.8  --   PROT  --  7.2  --   ALBUMIN 2.5* 2.5* 2.7*   No results for input(s): LIPASE, AMYLASE in the last 168 hours. Recent Labs  Lab 08/25/21 1528  AMMONIA 14   Coagulation Profile: No results for input(s): INR, PROTIME in the last 168 hours. Cardiac Enzymes: No results for input(s): CKTOTAL, CKMB, CKMBINDEX, TROPONINI in the last 168 hours. BNP (last 3 results) No results for input(s): PROBNP in the last 8760 hours. HbA1C: No results for input(s): HGBA1C in the last 72 hours. CBG: Recent Labs  Lab 08/29/21 2004 08/30/21 0607  GLUCAP 99 73   Lipid Profile: No results for input(s): CHOL, HDL, LDLCALC, TRIG, CHOLHDL, LDLDIRECT in the last 72 hours. Thyroid Function Tests: No results for input(s): TSH, T4TOTAL, FREET4, T3FREE, THYROIDAB in the last 72 hours. Anemia Panel: No results for input(s): VITAMINB12, FOLATE, FERRITIN, TIBC, IRON, RETICCTPCT in the last 72 hours. Sepsis Labs: No results for input(s): PROCALCITON, LATICACIDVEN in the last 168 hours.  Recent Results (from the past 240 hour(s))  Resp Panel by RT-PCR (Flu A&B, Covid) Nasopharyngeal Swab     Status: None   Collection Time: 08/21/21  9:50 AM   Specimen: Nasopharyngeal Swab; Nasopharyngeal(NP) swabs in vial transport medium  Result Value Ref Range Status   SARS Coronavirus 2 by RT PCR NEGATIVE NEGATIVE Final    Comment: (NOTE) SARS-CoV-2 target nucleic acids are NOT DETECTED.  The SARS-CoV-2 RNA is generally detectable in upper respiratory specimens during the acute phase of infection. The lowest concentration of SARS-CoV-2 viral copies this assay can detect is 138 copies/mL. A negative result does not preclude SARS-Cov-2 infection and should not be used as the sole  basis for treatment or other patient management decisions. A negative result may occur with  improper specimen collection/handling, submission of specimen other than nasopharyngeal swab, presence of viral mutation(s) within the areas targeted by this assay, and inadequate number of viral copies(<138 copies/mL). A negative result must be combined with clinical observations, patient history, and epidemiological information. The expected result is Negative.  Fact Sheet for Patients:  EntrepreneurPulse.com.au  Fact Sheet for Healthcare Providers:  IncredibleEmployment.be  This test is no t yet approved or cleared by the [place] FDA and  has been authorized for detection and/or diagnosis of SARS-CoV-2 by FDA under an Emergency Use Authorization (EUA). This EUA will remain  in effect (meaning this test can be used) for the duration of the COVID-19 declaration under Section 564(b)(1) of the Act,  21 U.S.C.section 360bbb-3(b)(1), unless the authorization is terminated  or revoked sooner.       Influenza A by PCR NEGATIVE NEGATIVE Final   Influenza B by PCR NEGATIVE NEGATIVE Final    Comment: (NOTE) The Xpert Xpress SARS-CoV-2/FLU/RSV plus assay is intended as an aid in the diagnosis of influenza from Nasopharyngeal swab specimens and should not be used as a sole basis for treatment. Nasal washings and aspirates are unacceptable for Xpert Xpress SARS-CoV-2/FLU/RSV testing.  Fact Sheet for Patients: EntrepreneurPulse.com.au  Fact Sheet for Healthcare Providers: IncredibleEmployment.be  This test is not yet approved or cleared by the [place] FDA and has been authorized for detection and/or diagnosis of SARS-CoV-2 by FDA under an Emergency Use Authorization (EUA). This EUA will remain in effect (meaning this test can be used) for the duration of the COVID-19 declaration under Section 564(b)(1) of the Act,  21 U.S.C. section 360bbb-3(b)(1), unless the authorization is terminated or revoked.  Performed at [facility], [address], [place], [place] [address]          Radiology Studies: CT HEAD WO CONTRAST (5MM)  Result Date: 08/29/2021 CLINICAL DATA:  Neuro deficit, stroke suspected EXAM: CT HEAD WITHOUT CONTRAST TECHNIQUE: Contiguous axial images were obtained from the base of the skull through the vertex without intravenous contrast. COMPARISON:  08/25/2021 FINDINGS: Brain: No evidence of acute infarction, hemorrhage, hydrocephalus, extra-axial collection or mass lesion/mass effect. Vascular: No hyperdense vessel. Skull: Negative for fracture or focal lesion. Sinuses/Orbits: No acute finding. Other: The mastoids are well aerated. IMPRESSION: No acute intracranial process. Electronically Signed   By: Merilyn Baba M.D.   On: 08/29/2021 21:47   IR Fluoro Guide CV Line Left  Result Date: 08/29/2021 INDICATION: 60 year old gentleman who requires long-term central venous access for antibiotic therapy returns to IR for placement of retracted left power line. Previous placement was quite difficult due to extensive central venous stenosis. Catheter tip was left in azygous vein. EXAM: Fluoroscopy guided replacement of retracted power line MEDICATIONS: None ANESTHESIA/SEDATION: None FLUOROSCOPY TIME:  Fluoroscopy Time: 1 minutes 42 seconds (13.2 mGy). COMPLICATIONS: None immediate. PROCEDURE: Informed written consent was obtained from the patient's family after a thorough discussion of the procedural risks, benefits and alternatives. All questions were addressed. Maximal Sterile Barrier Technique was utilized including caps, mask, sterile gowns, sterile gloves, sterile drape, hand hygiene and skin antiseptic. A timeout was performed prior to the initiation of the procedure. Patient positioned supine on the procedure table. The external segment of the retracted left anterior power line and  surrounding skin prepped and draped in usual fashion. Scout image demonstrated the catheter tip to be slightly retracted. The catheter did aspirate after removing the retention suture. Given that the cuff was already exposed, decision was made to replace the single-lumen power line. The existing power line was removed over 0.035 inch stiff Glidewire. New power line was cut to same length of 34 cm and inserted over the stiff glidewire. The tip was positioned in the azygous vein at the level of the mid chest. The catheter aspirated and flushed well. The catheter was secured to skin with silk suture and covered with bio patch and sterile dressing. IMPRESSION: Successful exchange of retracted left IJ power line. New Powerline ready for use. Patient has extensive central venous occlusion and the only central venous structure amenable to catheter placement was the azygous vein. If this catheter becomes nonfunctional or is retracted , contrast-enhanced chest CT should be performed to determine degree of venous  stenosis. If clinically warranted, venous recanalization may be considered. A venous recanalization is not possible, and the patient still requires central venous access, femoral approach central venous catheter should be considered. Electronically Signed   By: Miachel Roux M.D.   On: 08/29/2021 16:20   DG Chest Port 1 View  Result Date: 08/29/2021 CLINICAL DATA:  Post PICC line placement in a 60 year old male. EXAM: PORTABLE CHEST 1 VIEW COMPARISON:  08/11/2021. FINDINGS: Dialysis catheter again terminates in the mid to lower RIGHT atrium. Interval placement of a LEFT-sided central venous access device, tip orientation suggest placement within the azygous vein. Correlation with recent imaging report from placement states this is intended site of this catheter. Cardiomediastinal contours with stable cardiac enlargement. Pleural calcification and pleural fluid in the RIGHT chest with similar appearance. No signs  of new lobar consolidation or pneumothorax. Signs of renal osteodystrophy without acute skeletal process on limited assessment. Evidence of prior trauma to RIGHT ribs as before. IMPRESSION: Dialysis catheter again terminates in the mid to lower RIGHT atrium. Interval placement of a LEFT-sided central venous access device, tip orientation suggests placement within the azygous vein. This appears to be the intended placement due to central venous occlusion involving distal SVC as outlined in the previous interventional radiology report. Otherwise stable appearance of the chest with chronic volume loss and pleuroparenchymal scarring/calcification in the RIGHT chest. Electronically Signed   By: Zetta Bills M.D.   On: 08/29/2021 10:32   EEG adult  Result Date: 08/28/2021 Lora Havens, MD     08/28/2021  2:38 PM Patient Name: [name] [name] MRN: [id number] Epilepsy Attending: Lora Havens Referring Physician/Provider: Dr Kathie Dike Date: 08/28/2021 Duration: 29 mins Patient history: 60 yo M with ams. EEG to evaluate for seizure Level of alertness: Awake AEDs during EEG study: None Technical aspects: This EEG study was done with scalp electrodes positioned according to the 10-20 International system of electrode placement. Electrical activity was acquired at a sampling rate of 500Hz  and reviewed with a high frequency filter of 70Hz  and a low frequency filter of 1Hz . EEG data were recorded continuously and digitally stored. Description: No posterior dominant rhythm was seen. EEG showed continuous generalized and lateralized right hemisphere 3 to 6 Hz theta-delta slowing. Physiologic photic driving was not seen during photic stimulation.  Hyperventilation was not performed.   ABNORMALITY - Continuous slow, generalized and lateralized right hemisphere IMPRESSION: This study is  suggestive of uggestive of cortical dysfunction arising from right hemisphere, nonspecific etiology but secondary to underlying  structural abnormality. There is also moderat diffuse encephalopathy, nonspecific etiology. No seizures or epileptiform discharges were seen throughout the recording. Priyanka Barbra Sarks        Scheduled Meds:  apixaban  2.5 mg Oral BID   Chlorhexidine Gluconate Cloth  6 each Topical Q0600   epoetin (EPOGEN/PROCRIT) injection  4,000 Units Intravenous Q M,W,F-HD   feeding supplement (NEPRO CARB STEADY)  237 mL Oral TID BM   lidocaine  2 patch Transdermal Q24H   nicotine  21 mg Transdermal Daily   phenytoin (DILANTIN) IV  100 mg Intravenous Q8H   senna-docusate  1 tablet Oral BID   sevelamer carbonate  1,600 mg Oral TID WC   Continuous Infusions:  sodium chloride 250 mL (08/27/21 2052)   ampicillin (OMNIPEN) IV 2 g (08/29/21 2333)   cefTRIAXone (ROCEPHIN)  IV 2 g (08/29/21 2329)     LOS: 21 days    CRITICAL CARE Performed by: Athena Masse  Total critical care time: 40 minutes  Critical care time was exclusive of separately billable procedures and treating other patients.  Critical care was necessary to treat or prevent imminent or life-threatening deterioration.  Critical care was time spent personally by me on the following activities: development of treatment plan with patient and/or surrogate as well as nursing, discussions with consultants, evaluation of patient's response to treatment, examination of patient, obtaining history from patient or surrogate, ordering and performing treatments and interventions, ordering and review of laboratory studies, ordering and review of radiographic studies, pulse oximetry and re-evaluation of patient's condition.       Athena Masse, MD [facility]

## 2021-08-30 NOTE — Progress Notes (Signed)
[facility]  ROUNDING NOTE   Subjective:   Patient lethargic this morning Opens eyes to stimulus but not interactive or able to follow commands Appears to have left facial droop but not definitive CT head without contrast was negative for acute infarction hemorrhage or mass-effect from last night   Objective:  Vital signs in last 24 hours:  Temp:  [97 F (36.1 C)-98.9 F (37.2 C)] 98.9 F (37.2 C) (09/21 1200) Pulse Rate:  [69-88] 82 (09/21 1200) Resp:  [0-24] 24 (09/21 1200) BP: (84-139)/(39-70) 114/52 (09/21 1200) SpO2:  [93 %-100 %] 100 % (09/21 1200)  Weight change:  Filed Weights   08/08/21 0625 08/16/21 0754  Weight: 91 kg 91 kg    Intake/Output: I/O last 3 completed shifts: In: 2200 [IV Piggyback:2200] Out: 1500 [Other:1500]   Intake/Output this shift:  No intake/output data recorded.  Physical Exam: General: NAD, resting quietly  Head: Normocephalic,atraumatic  Eyes: Anicteric  Lungs Lungs clear to auscultation bilaterally  Heart: Regular rate and rhythm  Abdomen:  Soft, nontender, nondistended  Extremities: Trace to 1+ peripheral edema  Neurologic: Lethargic, opens eyes to verbal and tactile stimulus  Skin: No acute lesions or rashes  Access: Rt Herograft    Basic Metabolic Panel: Recent Labs  Lab 08/24/21 0503 08/25/21 0605 08/26/21 0525 08/28/21 0437 08/30/21 0420  NA 139 137 138 138 139  K 4.8 4.8 3.8 4.6 4.6  CL 95* 98 97* 97* 97*  CO2 28 28 29 23 27   GLUCOSE 81 85 120* 85 79  BUN 55* 73* 50* 71* 56*  CREATININE 7.13* 8.44* 6.84* 9.96* 7.61*  CALCIUM 10.0 9.8 9.7 10.2 9.3  PHOS 8.0*  --   --  7.8*  --      Liver Function Tests: Recent Labs  Lab 08/24/21 0503 08/26/21 0525 08/28/21 0437  AST  --  30  --   ALT  --  19  --   ALKPHOS  --  72  --   BILITOT  --  0.8  --   PROT  --  7.2  --   ALBUMIN 2.5* 2.5* 2.7*     No results for input(s): LIPASE, AMYLASE in the last 168 hours. Recent Labs  Lab  08/25/21 1528  AMMONIA 14     CBC: Recent Labs  Lab 08/24/21 0503 08/25/21 0605 08/26/21 0525 08/28/21 0437 08/30/21 0420  WBC 12.5* 11.5* 9.7 10.6* 9.4  HGB 9.7* 9.3* 9.4* 9.8* 9.5*  HCT 30.1* 28.8* 30.1* 31.1* 30.8*  MCV 98.4 96.6 99.0 100.0 101.0*  PLT 253 234 190 137* 117*     Cardiac Enzymes: No results for input(s): CKTOTAL, CKMB, CKMBINDEX, TROPONINI in the last 168 hours.  BNP: Invalid input(s): POCBNP  CBG: Recent Labs  Lab 08/29/21 2004 08/30/21 0607 08/30/21 0638  GLUCAP 99 73 83     Microbiology: Results for orders placed or performed during the hospital encounter of 08/08/21  Resp Panel by RT-PCR (Flu A&B, Covid) Nasopharyngeal Swab     Status: None   Collection Time: 08/08/21  1:22 PM   Specimen: Nasopharyngeal Swab; Nasopharyngeal(NP) swabs in vial transport medium  Result Value Ref Range Status   SARS Coronavirus 2 by RT PCR NEGATIVE NEGATIVE Final    Comment: (NOTE) SARS-CoV-2 target nucleic acids are NOT DETECTED.  The SARS-CoV-2 RNA is generally detectable in upper respiratory specimens during the acute phase of infection. The lowest concentration of SARS-CoV-2 viral copies this assay can detect is 138 copies/mL. A negative result does  not preclude SARS-Cov-2 infection and should not be used as the sole basis for treatment or other patient management decisions. A negative result may occur with  improper specimen collection/handling, submission of specimen other than nasopharyngeal swab, presence of viral mutation(s) within the areas targeted by this assay, and inadequate number of viral copies(<138 copies/mL). A negative result must be combined with clinical observations, patient history, and epidemiological information. The expected result is Negative.  Fact Sheet for Patients:  EntrepreneurPulse.com.au  Fact Sheet for Healthcare Providers:  IncredibleEmployment.be  This test is no t yet approved  or cleared by the [place] FDA and  has been authorized for detection and/or diagnosis of SARS-CoV-2 by FDA under an Emergency Use Authorization (EUA). This EUA will remain  in effect (meaning this test can be used) for the duration of the COVID-19 declaration under Section 564(b)(1) of the Act, 21 U.S.C.section 360bbb-3(b)(1), unless the authorization is terminated  or revoked sooner.       Influenza A by PCR NEGATIVE NEGATIVE Final   Influenza B by PCR NEGATIVE NEGATIVE Final    Comment: (NOTE) The Xpert Xpress SARS-CoV-2/FLU/RSV plus assay is intended as an aid in the diagnosis of influenza from Nasopharyngeal swab specimens and should not be used as a sole basis for treatment. Nasal washings and aspirates are unacceptable for Xpert Xpress SARS-CoV-2/FLU/RSV testing.  Fact Sheet for Patients: EntrepreneurPulse.com.au  Fact Sheet for Healthcare Providers: IncredibleEmployment.be  This test is not yet approved or cleared by the [place] FDA and has been authorized for detection and/or diagnosis of SARS-CoV-2 by FDA under an Emergency Use Authorization (EUA). This EUA will remain in effect (meaning this test can be used) for the duration of the COVID-19 declaration under Section 564(b)(1) of the Act, 21 U.S.C. section 360bbb-3(b)(1), unless the authorization is terminated or revoked.  Performed at [facility], [address] [place], [place] [address]   Culture, blood (Routine X 2) w Reflex to ID Panel     Status: Abnormal   Collection Time: 08/11/21  5:47 PM   Specimen: BLOOD  Result Value Ref Range Status   Specimen Description   Final    BLOOD LEFT HAND Performed at [facility], [address] [place], [place] [address]    Special Requests   Final    BOTTLES DRAWN AEROBIC AND ANAEROBIC Blood Culture adequate volume Performed at [facility], [address] [place], [place] [address]     Culture  Setup Time   Final    GRAM POSITIVE COCCI IN CHAINS IN BOTH AEROBIC AND ANAEROBIC BOTTLES Gram Stain Report Called to,Read Back By and Verified With: JASON ROBBINS 08/12/21 @ 0507 BY SB Performed at [facility], [address] [place], [place] [address]    Culture (A)  Final    ENTEROCOCCUS FAECALIS SUSCEPTIBILITIES PERFORMED ON PREVIOUS CULTURE WITHIN THE LAST 5 DAYS. Performed at [facility], [place] [address] [place], [place] [address]    Report Status 08/14/2021 FINAL  Final  Culture, blood (Routine X 2) w Reflex to ID Panel     Status: Abnormal   Collection Time: 08/11/21  5:47 PM   Specimen: BLOOD  Result Value Ref Range Status   Specimen Description   Final    BLOOD FOA Performed at [facility], [address] [place], [place] [address]    Special Requests   Final    BOTTLES DRAWN AEROBIC AND ANAEROBIC Blood Culture adequate volume Performed at [facility],  [place], [place] [address]    Culture  Setup Time   Final    GRAM POSITIVE COCCI IN CHAINS IN BOTH AEROBIC AND ANAEROBIC BOTTLES Gram Stain Report Called to,Read Back By and Verified With: JASON ROBBINS 08/12/21 @ 0507 BY SB Performed at [facility], [place] [address] [place], [place] [address]    Culture ENTEROCOCCUS FAECALIS (A)  Final   Report Status 08/14/2021 FINAL  Final   Organism ID, Bacteria ENTEROCOCCUS FAECALIS  Final      Susceptibility   Enterococcus faecalis - MIC*    AMPICILLIN <=2 SENSITIVE Sensitive     VANCOMYCIN 2 SENSITIVE Sensitive     GENTAMICIN SYNERGY SENSITIVE Sensitive     * ENTEROCOCCUS FAECALIS  Blood Culture ID Panel (Reflexed)     Status: Abnormal   Collection Time: 08/11/21  5:47 PM  Result Value Ref Range Status   Enterococcus faecalis DETECTED (A) NOT DETECTED Final    Comment: CRITICAL RESULT CALLED TO, READ BACK BY AND VERIFIED WITH: JASON ROBBINS 08/12/21 @ 0507 BY SB    Enterococcus Faecium NOT DETECTED NOT  DETECTED Final   Listeria monocytogenes NOT DETECTED NOT DETECTED Final   Staphylococcus species NOT DETECTED NOT DETECTED Final   Staphylococcus aureus (BCID) NOT DETECTED NOT DETECTED Final   Staphylococcus epidermidis NOT DETECTED NOT DETECTED Final   Staphylococcus lugdunensis NOT DETECTED NOT DETECTED Final   Streptococcus species NOT DETECTED NOT DETECTED Final   Streptococcus agalactiae NOT DETECTED NOT DETECTED Final   Streptococcus pneumoniae NOT DETECTED NOT DETECTED Final   Streptococcus pyogenes NOT DETECTED NOT DETECTED Final   A.calcoaceticus-baumannii NOT DETECTED NOT DETECTED Final   Bacteroides fragilis NOT DETECTED NOT DETECTED Final   Enterobacterales NOT DETECTED NOT DETECTED Final   Enterobacter cloacae complex NOT DETECTED NOT DETECTED Final   Escherichia coli NOT DETECTED NOT DETECTED Final   Klebsiella aerogenes NOT DETECTED NOT DETECTED Final   Klebsiella oxytoca NOT DETECTED NOT DETECTED Final   Klebsiella pneumoniae NOT DETECTED NOT DETECTED Final   Proteus species NOT DETECTED NOT DETECTED Final   Salmonella species NOT DETECTED NOT DETECTED Final   Serratia marcescens NOT DETECTED NOT DETECTED Final   Haemophilus influenzae NOT DETECTED NOT DETECTED Final   Neisseria meningitidis NOT DETECTED NOT DETECTED Final   Pseudomonas aeruginosa NOT DETECTED NOT DETECTED Final   Stenotrophomonas maltophilia NOT DETECTED NOT DETECTED Final   Candida albicans NOT DETECTED NOT DETECTED Final   Candida auris NOT DETECTED NOT DETECTED Final   Candida glabrata NOT DETECTED NOT DETECTED Final   Candida krusei NOT DETECTED NOT DETECTED Final   Candida parapsilosis NOT DETECTED NOT DETECTED Final   Candida tropicalis NOT DETECTED NOT DETECTED Final   Cryptococcus neoformans/gattii NOT DETECTED NOT DETECTED Final   Vancomycin resistance NOT DETECTED NOT DETECTED Final    Comment: Performed at [facility], [address] [place], [place] [address]  CULTURE, BLOOD  (ROUTINE X 2) w Reflex to ID Panel     Status: None   Collection Time: 08/12/21 11:58 PM   Specimen: BLOOD  Result Value Ref Range Status   Specimen Description BLOOD BLOOD LEFT HAND  Final   Special Requests   Final    BOTTLES DRAWN AEROBIC AND ANAEROBIC Blood Culture adequate volume   Culture   Final    NO GROWTH 5 DAYS Performed at [facility], [address], [place], [place] [address]    Report Status 08/18/2021 FINAL  Final  CULTURE,  BLOOD (ROUTINE X 2) w Reflex to ID Panel     Status: None   Collection Time: 08/12/21 11:58 PM   Specimen: BLOOD  Result Value Ref Range Status   Specimen Description BLOOD LEFT RADIAL  Final   Special Requests   Final    BOTTLES DRAWN AEROBIC AND ANAEROBIC Blood Culture results may not be optimal due to an inadequate volume of blood received in culture bottles   Culture   Final    NO GROWTH 5 DAYS Performed at [facility], [address] [place], [place] [address]    Report Status 08/18/2021 FINAL  Final  Resp Panel by RT-PCR (Flu A&B, Covid) Nasopharyngeal Swab     Status: None   Collection Time: 08/21/21  9:50 AM   Specimen: Nasopharyngeal Swab; Nasopharyngeal(NP) swabs in vial transport medium  Result Value Ref Range Status   SARS Coronavirus 2 by RT PCR NEGATIVE NEGATIVE Final    Comment: (NOTE) SARS-CoV-2 target nucleic acids are NOT DETECTED.  The SARS-CoV-2 RNA is generally detectable in upper respiratory specimens during the acute phase of infection. The lowest concentration of SARS-CoV-2 viral copies this assay can detect is 138 copies/mL. A negative result does not preclude SARS-Cov-2 infection and should not be used as the sole basis for treatment or other patient management decisions. A negative result may occur with  improper specimen collection/handling, submission of specimen other than nasopharyngeal swab, presence of viral mutation(s) within the areas targeted by this assay, and inadequate number of  viral copies(<138 copies/mL). A negative result must be combined with clinical observations, patient history, and epidemiological information. The expected result is Negative.  Fact Sheet for Patients:  EntrepreneurPulse.com.au  Fact Sheet for Healthcare Providers:  IncredibleEmployment.be  This test is no t yet approved or cleared by the [place] FDA and  has been authorized for detection and/or diagnosis of SARS-CoV-2 by FDA under an Emergency Use Authorization (EUA). This EUA will remain  in effect (meaning this test can be used) for the duration of the COVID-19 declaration under Section 564(b)(1) of the Act, 21 U.S.C.section 360bbb-3(b)(1), unless the authorization is terminated  or revoked sooner.       Influenza A by PCR NEGATIVE NEGATIVE Final   Influenza B by PCR NEGATIVE NEGATIVE Final    Comment: (NOTE) The Xpert Xpress SARS-CoV-2/FLU/RSV plus assay is intended as an aid in the diagnosis of influenza from Nasopharyngeal swab specimens and should not be used as a sole basis for treatment. Nasal washings and aspirates are unacceptable for Xpert Xpress SARS-CoV-2/FLU/RSV testing.  Fact Sheet for Patients: EntrepreneurPulse.com.au  Fact Sheet for Healthcare Providers: IncredibleEmployment.be  This test is not yet approved or cleared by the [place] FDA and has been authorized for detection and/or diagnosis of SARS-CoV-2 by FDA under an Emergency Use Authorization (EUA). This EUA will remain in effect (meaning this test can be used) for the duration of the COVID-19 declaration under Section 564(b)(1) of the Act, 21 U.S.C. section 360bbb-3(b)(1), unless the authorization is terminated or revoked.  Performed at [facility], [address] [place], [place] [address]     Coagulation Studies: No results for input(s): LABPROT, INR in the last 72 hours.  Urinalysis: No  results for input(s): COLORURINE, LABSPEC, PHURINE, GLUCOSEU, HGBUR, BILIRUBINUR, KETONESUR, PROTEINUR, UROBILINOGEN, NITRITE, LEUKOCYTESUR in the last 72 hours.  Invalid input(s): APPERANCEUR    Imaging: CT HEAD WO CONTRAST (5MM)  Result Date: 08/29/2021 CLINICAL DATA:  Neuro deficit, stroke suspected EXAM: CT HEAD WITHOUT  CONTRAST TECHNIQUE: Contiguous axial images were obtained from the base of the skull through the vertex without intravenous contrast. COMPARISON:  08/25/2021 FINDINGS: Brain: No evidence of acute infarction, hemorrhage, hydrocephalus, extra-axial collection or mass lesion/mass effect. Vascular: No hyperdense vessel. Skull: Negative for fracture or focal lesion. Sinuses/Orbits: No acute finding. Other: The mastoids are well aerated. IMPRESSION: No acute intracranial process. Electronically Signed   By: Merilyn Baba M.D.   On: 08/29/2021 21:47   IR Fluoro Guide CV Line Left  Result Date: 08/29/2021 INDICATION: 60 year old gentleman who requires long-term central venous access for antibiotic therapy returns to IR for placement of retracted left power line. Previous placement was quite difficult due to extensive central venous stenosis. Catheter tip was left in azygous vein. EXAM: Fluoroscopy guided replacement of retracted power line MEDICATIONS: None ANESTHESIA/SEDATION: None FLUOROSCOPY TIME:  Fluoroscopy Time: 1 minutes 42 seconds (13.2 mGy). COMPLICATIONS: None immediate. PROCEDURE: Informed written consent was obtained from the patient's family after a thorough discussion of the procedural risks, benefits and alternatives. All questions were addressed. Maximal Sterile Barrier Technique was utilized including caps, mask, sterile gowns, sterile gloves, sterile drape, hand hygiene and skin antiseptic. A timeout was performed prior to the initiation of the procedure. Patient positioned supine on the procedure table. The external segment of the retracted left anterior power line and  surrounding skin prepped and draped in usual fashion. Scout image demonstrated the catheter tip to be slightly retracted. The catheter did aspirate after removing the retention suture. Given that the cuff was already exposed, decision was made to replace the single-lumen power line. The existing power line was removed over 0.035 inch stiff Glidewire. New power line was cut to same length of 34 cm and inserted over the stiff glidewire. The tip was positioned in the azygous vein at the level of the mid chest. The catheter aspirated and flushed well. The catheter was secured to skin with silk suture and covered with bio patch and sterile dressing. IMPRESSION: Successful exchange of retracted left IJ power line. New Powerline ready for use. Patient has extensive central venous occlusion and the only central venous structure amenable to catheter placement was the azygous vein. If this catheter becomes nonfunctional or is retracted , contrast-enhanced chest CT should be performed to determine degree of venous stenosis. If clinically warranted, venous recanalization may be considered. A venous recanalization is not possible, and the patient still requires central venous access, femoral approach central venous catheter should be considered. Electronically Signed   By: Miachel Roux M.D.   On: 08/29/2021 16:20   DG Chest Port 1 View  Result Date: 08/30/2021 CLINICAL DATA:  Shortness of breath EXAM: PORTABLE CHEST 1 VIEW COMPARISON:  08/29/2021 FINDINGS: There is a large bore central venous catheter with tip in the inferior right atrium. Again seen is a left IJ catheter. The distal portion of the catheter has an unusual orientation consistent with azygous vein placement. Stable cardiac enlargement and aortic atherosclerosis. Again seen is volume loss throughout the right hemithorax with diffuse pleural thickening and calcification. Superimposed pulmonary vascular congestion noted. Atelectasis is identified within the lung  bases. IMPRESSION: 1. Cardiac enlargement and pulmonary vascular congestion. 2. Persistent volume loss throughout the right hemithorax with diffuse pleural thickening and calcification. 3. Bibasilar atelectasis and probable small right pleural effusion. Electronically Signed   By: Kerby Moors M.D.   On: 08/30/2021 07:29   DG Chest Port 1 View  Result Date: 08/29/2021 CLINICAL DATA:  Post PICC line placement in a  60 year old male. EXAM: PORTABLE CHEST 1 VIEW COMPARISON:  08/11/2021. FINDINGS: Dialysis catheter again terminates in the mid to lower RIGHT atrium. Interval placement of a LEFT-sided central venous access device, tip orientation suggest placement within the azygous vein. Correlation with recent imaging report from placement states this is intended site of this catheter. Cardiomediastinal contours with stable cardiac enlargement. Pleural calcification and pleural fluid in the RIGHT chest with similar appearance. No signs of new lobar consolidation or pneumothorax. Signs of renal osteodystrophy without acute skeletal process on limited assessment. Evidence of prior trauma to RIGHT ribs as before. IMPRESSION: Dialysis catheter again terminates in the mid to lower RIGHT atrium. Interval placement of a LEFT-sided central venous access device, tip orientation suggests placement within the azygous vein. This appears to be the intended placement due to central venous occlusion involving distal SVC as outlined in the previous interventional radiology report. Otherwise stable appearance of the chest with chronic volume loss and pleuroparenchymal scarring/calcification in the RIGHT chest. Electronically Signed   By: Zetta Bills M.D.   On: 08/29/2021 10:32   EEG adult  Result Date: 08/28/2021 Lora Havens, MD     08/28/2021  2:38 PM Patient Name: [name] [name] MRN: [id number] Epilepsy Attending: Lora Havens Referring Physician/Provider: Dr Kathie Dike Date: 08/28/2021 Duration: 29 mins Patient  history: 60 yo M with ams. EEG to evaluate for seizure Level of alertness: Awake AEDs during EEG study: None Technical aspects: This EEG study was done with scalp electrodes positioned according to the 10-20 International system of electrode placement. Electrical activity was acquired at a sampling rate of 500Hz  and reviewed with a high frequency filter of 70Hz  and a low frequency filter of 1Hz . EEG data were recorded continuously and digitally stored. Description: No posterior dominant rhythm was seen. EEG showed continuous generalized and lateralized right hemisphere 3 to 6 Hz theta-delta slowing. Physiologic photic driving was not seen during photic stimulation.  Hyperventilation was not performed.   ABNORMALITY - Continuous slow, generalized and lateralized right hemisphere IMPRESSION: This study is  suggestive of uggestive of cortical dysfunction arising from right hemisphere, nonspecific etiology but secondary to underlying structural abnormality. There is also moderat diffuse encephalopathy, nonspecific etiology. No seizures or epileptiform discharges were seen throughout the recording. Priyanka Barbra Sarks     Medications:    sodium chloride 250 mL (08/27/21 2052)   ampicillin (OMNIPEN) IV 2 g (08/30/21 1201)   cefTRIAXone (ROCEPHIN)  IV 2 g (08/30/21 1115)     apixaban  2.5 mg Oral BID   Chlorhexidine Gluconate Cloth  6 each Topical Q0600   epoetin (EPOGEN/PROCRIT) injection  4,000 Units Intravenous Q M,W,F-HD   feeding supplement (NEPRO CARB STEADY)  237 mL Oral TID BM   lidocaine  2 patch Transdermal Q24H   nicotine  21 mg Transdermal Daily   phenytoin (DILANTIN) IV  100 mg Intravenous Q8H   senna-docusate  1 tablet Oral BID   sevelamer carbonate  1,600 mg Oral TID WC   sodium chloride, acetaminophen, bisacodyl, hydrALAZINE, iohexol, LORazepam, methocarbamol, Muscle Rub, ondansetron (ZOFRAN) IV, oxyCODONE-acetaminophen, polyethylene glycol, sodium chloride flush  Assessment/ Plan:  Mr.  [name] [name] is a 60 y.o. black male with end stage renal disease on hemodialysis, hypertension, congestive heart failure, substance abuse who is admitted to [facility] on 08/08/2021 for Lower back pain [M54.50] Acute midline low back pain without sciatica [M54.50] At risk for inadequate pain control [Z91.89] Back pain [M54.9] Found to have eneterococcus faecalis bacteremia and osteomyelitis of  C5-C6.   [facility] Right AVG Hero 90.5kg  #ESRD on HD TTS Patient was due to being placed in rehab in [place], he will receive dialysis at [facility] on a TTS schedule.  -Next treatment scheduled for Thursday -Eliquis 2.5 twice a day to prevent dialysis access clotting  #Hypotension BP staying in the 90s.  Not on pressors at the moment  #Anemia of CKD Lab Results  Component Value Date   HGB 9.5 (L) 08/30/2021  Continue Low dose Epogen with HD treatments  #Secondary Hyperparathyroidism Lab Results  Component Value Date   PTH 583.2 (H) 12/11/2012   CALCIUM 9.3 08/30/2021   CAION 1.27 (H) 12/26/2012   PHOS 7.8 (H) 08/28/2021   Calcium at goal. Phosphorus remains elevated. 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  #Altered Mental Status Continue delirium precautions.  MRI brain with possible acute infarcts in posterior left temporal lobe and right insular cortex.  Mild chronic white matter microangiopathy Last Ativan given 08/30/2021 at 12:20 PM   LOS: [place] 9/21/20221:55 PM

## 2021-08-30 NOTE — Progress Notes (Signed)
OT Cancellation Note  Patient Details Name: [name] [name] MRN: [id number] DOB: 1961/12/06   Cancelled Treatment:    Reason Eval/Treat Not Completed: Other (comment) Pt transitioned to comfort measures. OT order discontinued by palliative provider. Will sign off at this time. Thank you.   Gerrianne Scale, [place], OTR/L ascom [phone] 08/30/21, 6:07 PM

## 2021-08-31 ENCOUNTER — Inpatient Hospital Stay: Payer: Medicare Other

## 2021-08-31 DIAGNOSIS — Z95828 Presence of other vascular implants and grafts: Secondary | ICD-10-CM | POA: Diagnosis not present

## 2021-08-31 DIAGNOSIS — G9341 Metabolic encephalopathy: Secondary | ICD-10-CM | POA: Diagnosis not present

## 2021-08-31 DIAGNOSIS — M5440 Lumbago with sciatica, unspecified side: Secondary | ICD-10-CM | POA: Diagnosis not present

## 2021-08-31 DIAGNOSIS — N186 End stage renal disease: Secondary | ICD-10-CM | POA: Diagnosis not present

## 2021-08-31 DIAGNOSIS — F1910 Other psychoactive substance abuse, uncomplicated: Secondary | ICD-10-CM | POA: Diagnosis not present

## 2021-08-31 DIAGNOSIS — R4182 Altered mental status, unspecified: Secondary | ICD-10-CM | POA: Diagnosis not present

## 2021-08-31 DIAGNOSIS — I82C11 Acute embolism and thrombosis of right internal jugular vein: Secondary | ICD-10-CM | POA: Diagnosis not present

## 2021-08-31 DIAGNOSIS — R569 Unspecified convulsions: Secondary | ICD-10-CM | POA: Diagnosis not present

## 2021-08-31 LAB — GLUCOSE, CAPILLARY
Glucose-Capillary: 78 mg/dL (ref 70–99)
Glucose-Capillary: 98 mg/dL (ref 70–99)
Glucose-Capillary: 63 mg/dL — ABNORMAL LOW (ref 70–99)
Glucose-Capillary: 101 mg/dL — ABNORMAL HIGH (ref 70–99)
Glucose-Capillary: 98 mg/dL (ref 70–99)

## 2021-08-31 MED ORDER — DEXTROSE-NACL 5-0.9 % IV SOLN: INTRAVENOUS | Status: DC

## 2021-08-31 MED ORDER — SODIUM CHLORIDE 0.9 % IV SOLN
200.0000 mg | Freq: Once | INTRAVENOUS | Status: AC
  Administered 2021-08-31: 200 mg via INTRAVENOUS
  Filled 2021-08-31: qty 20

## 2021-08-31 MED ORDER — PANTOPRAZOLE SODIUM 40 MG IV SOLR
40.0000 mg | INTRAVENOUS | Status: DC
  Administered 2021-08-31: 40 mg via INTRAVENOUS
  Filled 2021-08-31: qty 40

## 2021-08-31 MED ORDER — SCOPOLAMINE 1 MG/3DAYS TD PT72
1.0000 | MEDICATED_PATCH | TRANSDERMAL | Status: DC
  Administered 2021-08-31: 1.5 mg via TRANSDERMAL
  Filled 2021-08-31: qty 1

## 2021-08-31 MED ORDER — SODIUM CHLORIDE 0.9 % IV SOLN
100.0000 mg | Freq: Two times a day (BID) | INTRAVENOUS | Status: DC
  Administered 2021-09-01: 100 mg via INTRAVENOUS
  Filled 2021-08-31 (×3): qty 10

## 2021-08-31 MED ORDER — DEXTROSE 50 % IV SOLN
INTRAVENOUS | Status: AC
  Administered 2021-08-31: 50 mL
  Filled 2021-08-31: qty 50

## 2021-08-31 MED ORDER — INSULIN ASPART 100 UNIT/ML IJ SOLN: 0.0000 [IU] | INTRAMUSCULAR | Status: DC

## 2021-08-31 MED ORDER — SODIUM CHLORIDE 0.9 % IV SOLN: 50.0000 mg | INTRAVENOUS | Status: DC

## 2021-08-31 NOTE — Progress Notes (Signed)
PROGRESS NOTE  [name] [name]    DOB: 1961-05-09, 60 y.o.  UKG:[id number]  PCP: Pcp, No   Code Status: DNR   DOA: 08/08/2021   LOS: 27  Brief Narrative of Current Hospitalization  [name] [name] is a 60 y.o. male with a PMH significant for ESRD-HD, hypertension, tobacco abuse, polysubstance abuse, diverticulitis, anemia, jugular vein thrombosis, GI bleeding, right pneumothorax presented with worsening lower back pain. He was transitioned to comfort care yesterday. However, neurology recommended continuing seizure treatment and evaluation so patient's family have removed comfort care decision in favor of continuing workup up and patient remains DNR.  Neurology has initiated transfer to [facility] facility for LTM EEG.   08/31/21 -clinically, patient remains unchanged to me. Minimally responsive and not conversant.   Assessment & Plan  Principal Problem:   Lower back pain Active Problems:   End-stage renal disease on hemodialysis (HCC)   Polysubstance abuse (HCC)   Tobacco abuse   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 (HCC)   Anemia in ESRD (end-stage renal disease) (HCC)   HTN (hypertension)   Back pain  Enterococcus faecalis bacteremia  Aortic valve endocarditis- remains afebrile -per ID recommendations (note 9/9) will need amp + CTX x6 weeks total (until 10/15) followed by amoxicillin. Follow up appointment 10/13 with ID.  - scopolamine patch for oral secretions  osteomyelitis in C5-C6, discitis in L4-L5 Left psoas muscle infection with superimposed 1.3 cm phlegmon/early abscess formation -Continue antibiotics as above.  Continue pain management and bowel regimen    End-stage renal disease on hemodialysis -Nephrology following.  Dialysis as per nephrology schedule -Dialysis schedule changed from M WF to TTS   Acute encephalopathy with Acute infarct on brain MRI- appears to me to be at stable state of alertness - management and further workup of seizure contribution  management per neurology - phenytoin level in process  Anemia of chronic disease -From renal failure - follow with dialysis labs  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 -Eliquis 2.5 mg twice daily as per Dr. Keturah Barre recommendations.    H/o Hypertension. Currently Hypotension -Blood pressure currently stable.  Off amlodipine  Chronic combined CHF -LVEF of 30 to 35% as per TTE on 08/13/2021.  Volume managed by dialysis.  Strict input output.  Daily weights.  Fluid restriction  Depression- stable   Generalized conditioning -PT/OT recommend. Social worker following.   Goals of care -continue family/palliative conversations  DVT prophylaxis: apixaban (ELIQUIS) tablet 2.5 mg Start: 08/30/21 2200 apixaban (ELIQUIS) tablet 2.5 mg   Diet:  Diet Orders (From admission, onward)     Start     Ordered   08/27/21 1610  DIET - DYS 1 Room service appropriate? Yes; Fluid consistency: Thin  Diet effective now       Question Answer Comment  Room service appropriate? Yes   Fluid consistency: Thin      08/27/21 1609   08/21/21 0000  Diet - low sodium heart healthy       Comments: Renal hemodialysis diet   08/21/21 0940            Subjective 08/31/21    Pt unresponsive at bedside today. No family members present. Did not appear to be in distress.  Disposition Plan & Communication  Status is: Inpatient  Remains inpatient appropriate because:Ongoing diagnostic testing needed not appropriate for outpatient work up  Dispo: The patient is from: Home              Anticipated d/c  is to:  unknown              Patient currently is not medically stable to d/c.   Difficult to place patient No        Family Communication: parents   Consults, Procedures, Significant Events  Consultants:  Nephrology Palliative Neurology   Procedures/significant events:  HD EEG TEE  Antimicrobials:  Anti-infectives (From admission, onward)    Start     Dose/Rate Route  Frequency Ordered Stop   08/31/21 0000  ampicillin (OMNIPEN) 2 g in sodium chloride 0.9 % 100 mL IVPB        2 g 300 mL/hr over 20 Minutes Intravenous Every 12 hours 08/30/21 2143     08/30/21 2200  cefTRIAXone (ROCEPHIN) 2 g in sodium chloride 0.9 % 100 mL IVPB        2 g 200 mL/hr over 30 Minutes Intravenous Every 12 hours 08/30/21 2112     08/21/21 0000  ampicillin IVPB        2 g Intravenous Every 12 hours 08/21/21 0940 09/27/21 2359   08/21/21 0000  cefTRIAXone (ROCEPHIN) IVPB        2 g Intravenous Every 12 hours 08/21/21 0940 09/27/21 2359   08/14/21 1530  cefTRIAXone (ROCEPHIN) 2 g in sodium chloride 0.9 % 100 mL IVPB  Status:  Discontinued        2 g 200 mL/hr over 30 Minutes Intravenous Every 12 hours 08/14/21 1443 08/30/21 1711   08/14/21 1200  vancomycin (VANCOCIN) IVPB 1000 mg/200 mL premix  Status:  Discontinued        1,000 mg 200 mL/hr over 60 Minutes Intravenous Every M-W-F (Hemodialysis) 08/11/21 1831 08/12/21 0517   08/12/21 2200  cefTRIAXone (ROCEPHIN) 2 g in sodium chloride 0.9 % 100 mL IVPB  Status:  Discontinued        2 g 200 mL/hr over 30 Minutes Intravenous Every 12 hours 08/12/21 1923 08/14/21 1443   08/12/21 0615  ampicillin (OMNIPEN) 2 g in sodium chloride 0.9 % 100 mL IVPB  Status:  Discontinued        2 g 300 mL/hr over 20 Minutes Intravenous Every 12 hours 08/12/21 0520 08/30/21 1711   08/11/21 1930  vancomycin (VANCOREADY) IVPB 2000 mg/400 mL        2,000 mg 200 mL/hr over 120 Minutes Intravenous  Once 08/11/21 1831 08/12/21 0156   08/11/21 1900  cefTRIAXone (ROCEPHIN) 2 g in sodium chloride 0.9 % 100 mL IVPB  Status:  Discontinued        2 g 200 mL/hr over 30 Minutes Intravenous Every 12 hours 08/11/21 1829 08/12/21 0517        Objective   Vitals:   08/31/21 0400 08/31/21 0500 08/31/21 0600 08/31/21 0615  BP: (!) 94/47 (!) 91/43 (!) 83/39 (!) 91/52  Pulse:   75   Resp: 17 15 (!) 9 10  Temp:  98.7 F (37.1 C)    TempSrc:      SpO2: 100% 99%  100% 100%  Weight:      Height:        Intake/Output Summary (Last 24 hours) at 08/31/2021 1402 Last data filed at 08/31/2021 0600 Gross per 24 hour  Intake 200 ml  Output --  Net 200 ml   Filed Weights   08/08/21 0625 08/16/21 0754  Weight: 91 kg 91 kg    Patient BMI: Body mass index is 26.47 kg/m.   Physical Exam: General: asleep HEENT: atraumatic, Respiratory: normal respiratory  effort. Cardiovascular: RRR, quick capillary refill  Nervous: reacts to painful stimuli Extremities: muscle wasting Skin: dry, intact, normal temperature, normal color, No rashes, lesions or ulcers  Labs   I have personally reviewed following labs and imaging studies No results displayed because visit has over 200 results.       Recent Results (from the past 240 hour(s))  MRSA Next Gen by PCR, Nasal     Status: None   Collection Time: 08-Sep-2021 12:00 PM   Specimen: Nasal Mucosa; Nasal Swab  Result Value Ref Range Status   MRSA by PCR Next Gen NOT DETECTED NOT DETECTED Final    Comment: (NOTE) The GeneXpert MRSA Assay (FDA approved for NASAL specimens only), is one component of a comprehensive MRSA colonization surveillance program. It is not intended to diagnose MRSA infection nor to guide or monitor treatment for MRSA infections. Test performance is not FDA approved in patients less than 83 years old. Performed at [facility] [address], [place], [place] [address]      Imaging Studies  EEG adult  Result Date: 09/08/2021 Derek Jack, MD     09/08/21  6:24 PM Routine EEG Report [name] [name] is a 60 y.o. male with a history of altered mental status who is undergoing an EEG to evaluate for seizures. Report: This EEG was acquired with electrodes placed according to the International 10-20 electrode system (including Fp1, Fp2, F3, F4, C3, C4, P3, P4, O1, O2, T3, T4, T5, T6, A1, A2, Fz, Cz, Pz). The following electrodes were missing or displaced: none. The occipital  dominant rhythm was 5-7 Hz. This activity is reactive to stimulation. Drowsiness was manifested by background fragmentation; deeper stages of sleep were manifested by K complexes and sleep spindles. There was no focal slowing. There were no interictal epileptiform discharges. There were no electrographic seizures identified. Photic stimulation and hyperventilation were not performed. Impression and clinical correlation: This EEG was obtained while awake and asleep and is abnormal due to moderate diffuse slowing, indicative of global cerebral dysfunction. Su Monks, MD [facility] [phone] If 7pm- 7am, please page neurology on call as listed in [place].   Medications   Scheduled Meds:  apixaban  2.5 mg Oral BID   Chlorhexidine Gluconate Cloth  6 each Topical Q0600   insulin aspart  0-6 Units Subcutaneous Q4H   lidocaine  2 patch Transdermal Q24H   nicotine  21 mg Transdermal Daily   pantoprazole (PROTONIX) IV  40 mg Intravenous Q24H   phenytoin (DILANTIN) IV  100 mg Intravenous Q8H   sevelamer carbonate  800 mg Oral TID WC   Continuous Infusions:  sodium chloride 250 mL (08/27/21 2052)   ampicillin (OMNIPEN) IV 2 g (Sep 08, 2021 2353)   cefTRIAXone (ROCEPHIN)  IV 2 g (08/31/21 1119)     LOS: 22 days   Time spent: >35 min   Richarda Osmond, DO [facility] 08/31/2021, 2:02 PM   To contact the [facility] Attending or Consulting provider for this patient: Check the care team in [facility] for a) attending/consulting [place] provider listed and b) the [facility] team listed Log into [email or web address] and use [place]'s universal password to access. If you do not have the password, please contact the hospital operator. Locate the [facility] provider you are looking for under [facility] and page to a number that you can be directly reached. If you still have difficulty reaching the provider, please page the [facility] (Director on Call) for the Hospitalists listed on amion for  assistance.

## 2021-08-31 NOTE — Progress Notes (Signed)
[facility]  ROUNDING NOTE   Subjective:   Patient lethargic this morning, drooling Opens eyes to stimulus but not interactive or able to follow commands CT head without contrast was negative for acute infarction hemorrhage or mass-effect from last night EEG suggested ongoing seizures   Objective:  Vital signs in last 24 hours:  Temp:  [98.2 F (36.8 C)-98.9 F (37.2 C)] 98.7 F (37.1 C) (09/22 0500) Pulse Rate:  [25-88] 75 (09/22 0600) Resp:  [0-24] 10 (09/22 0615) BP: (71-116)/(37-56) 91/52 (09/22 0615) SpO2:  [95 %-100 %] 100 % (09/22 0615)  Weight change:  Filed Weights   08/08/21 0625 08/16/21 0754  Weight: 91 kg 91 kg    Intake/Output: I/O last 3 completed shifts: In: 2400 [IV Piggyback:2400] Out: 1500 [Other:1500]   Intake/Output this shift:  No intake/output data recorded.  Physical Exam: General: NAD, resting quietly  Head: Normocephalic,atraumatic  Lungs Lungs clear to auscultation bilaterally  Heart: Regular rate and rhythm  Abdomen:  Soft, nontender, nondistended  Extremities: Trace to 1+ peripheral edema  Neurologic: Lethargic, opens eyes to  tactile stimulus  Skin: No acute lesions or rashes  Access: Rt Herograft-bruit noted    Basic Metabolic Panel: Recent Labs  Lab 08/25/21 0605 08/26/21 0525 08/28/21 0437 08/30/21 0420  NA 137 138 138 139  K 4.8 3.8 4.6 4.6  CL 98 97* 97* 97*  CO2 28 29 23 27   GLUCOSE 85 120* 85 79  BUN 73* 50* 71* 56*  CREATININE 8.44* 6.84* 9.96* 7.61*  CALCIUM 9.8 9.7 10.2 9.3  PHOS  --   --  7.8*  --      Liver Function Tests: Recent Labs  Lab 08/26/21 0525 08/28/21 0437  AST 30  --   ALT 19  --   ALKPHOS 72  --   BILITOT 0.8  --   PROT 7.2  --   ALBUMIN 2.5* 2.7*     No results for input(s): LIPASE, AMYLASE in the last 168 hours. Recent Labs  Lab 08/25/21 1528  AMMONIA 14     CBC: Recent Labs  Lab 08/25/21 0605 08/26/21 0525 08/28/21 0437 08/30/21 0420  WBC 11.5* 9.7  10.6* 9.4  HGB 9.3* 9.4* 9.8* 9.5*  HCT 28.8* 30.1* 31.1* 30.8*  MCV 96.6 99.0 100.0 101.0*  PLT 234 190 137* 117*     Cardiac Enzymes: No results for input(s): CKTOTAL, CKMB, CKMBINDEX, TROPONINI in the last 168 hours.  BNP: Invalid input(s): POCBNP  CBG: Recent Labs  Lab 08/29/21 2004 08/30/21 0607 08/30/21 0638 08/31/21 0749  GLUCAP 99 73 83 78     Microbiology: Results for orders placed or performed during the hospital encounter of 08/08/21  Resp Panel by RT-PCR (Flu A&B, Covid) Nasopharyngeal Swab     Status: None   Collection Time: 08/08/21  1:22 PM   Specimen: Nasopharyngeal Swab; Nasopharyngeal(NP) swabs in vial transport medium  Result Value Ref Range Status   SARS Coronavirus 2 by RT PCR NEGATIVE NEGATIVE Final    Comment: (NOTE) SARS-CoV-2 target nucleic acids are NOT DETECTED.  The SARS-CoV-2 RNA is generally detectable in upper respiratory specimens during the acute phase of infection. The lowest concentration of SARS-CoV-2 viral copies this assay can detect is 138 copies/mL. A negative result does not preclude SARS-Cov-2 infection and should not be used as the sole basis for treatment or other patient management decisions. A negative result may occur with  improper specimen collection/handling, submission of specimen other than nasopharyngeal swab, presence of viral  mutation(s) within the areas targeted by this assay, and inadequate number of viral copies(<138 copies/mL). A negative result must be combined with clinical observations, patient history, and epidemiological information. The expected result is Negative.  Fact Sheet for Patients:  EntrepreneurPulse.com.au  Fact Sheet for Healthcare Providers:  IncredibleEmployment.be  This test is no t yet approved or cleared by the [place] FDA and  has been authorized for detection and/or diagnosis of SARS-CoV-2 by FDA under an Emergency Use Authorization (EUA).  This EUA will remain  in effect (meaning this test can be used) for the duration of the COVID-19 declaration under Section 564(b)(1) of the Act, 21 U.S.C.section 360bbb-3(b)(1), unless the authorization is terminated  or revoked sooner.       Influenza A by PCR NEGATIVE NEGATIVE Final   Influenza B by PCR NEGATIVE NEGATIVE Final    Comment: (NOTE) The Xpert Xpress SARS-CoV-2/FLU/RSV plus assay is intended as an aid in the diagnosis of influenza from Nasopharyngeal swab specimens and should not be used as a sole basis for treatment. Nasal washings and aspirates are unacceptable for Xpert Xpress SARS-CoV-2/FLU/RSV testing.  Fact Sheet for Patients: EntrepreneurPulse.com.au  Fact Sheet for Healthcare Providers: IncredibleEmployment.be  This test is not yet approved or cleared by the [place] FDA and has been authorized for detection and/or diagnosis of SARS-CoV-2 by FDA under an Emergency Use Authorization (EUA). This EUA will remain in effect (meaning this test can be used) for the duration of the COVID-19 declaration under Section 564(b)(1) of the Act, 21 U.S.C. section 360bbb-3(b)(1), unless the authorization is terminated or revoked.  Performed at [facility], [address] [place], [place] [address]   Culture, blood (Routine X 2) w Reflex to ID Panel     Status: Abnormal   Collection Time: 08/11/21  5:47 PM   Specimen: BLOOD  Result Value Ref Range Status   Specimen Description   Final    BLOOD LEFT HAND Performed at [facility], [address], [place], [place] [address]    Special Requests   Final    BOTTLES DRAWN AEROBIC AND ANAEROBIC Blood Culture adequate volume Performed at [facility], [address] [place], [place] [address]    Culture  Setup Time   Final    GRAM POSITIVE COCCI IN CHAINS IN BOTH AEROBIC AND ANAEROBIC BOTTLES Gram Stain Report Called to,Read Back By and Verified With:  JASON ROBBINS 08/12/21 @ 0507 BY SB Performed at [facility], [address] [place], [place] [address]    Culture (A)  Final    ENTEROCOCCUS FAECALIS SUSCEPTIBILITIES PERFORMED ON PREVIOUS CULTURE WITHIN THE LAST 5 DAYS. Performed at [facility], [place] [address] [place] [place], [place] [address]    Report Status 08/14/2021 FINAL  Final  Culture, blood (Routine X 2) w Reflex to ID Panel     Status: Abnormal   Collection Time: 08/11/21  5:47 PM   Specimen: BLOOD  Result Value Ref Range Status   Specimen Description   Final    BLOOD FOA Performed at [facility], [address] [place], [place] [address]    Special Requests   Final    BOTTLES DRAWN AEROBIC AND ANAEROBIC Blood Culture adequate volume Performed at [facility], [address] [place], [place] [address]    Culture  Setup Time   Final    GRAM POSITIVE COCCI IN CHAINS IN BOTH AEROBIC AND ANAEROBIC BOTTLES Gram Stain Report Called to,Read Back By and Verified  With: JASON ROBBINS 08/12/21 @ 0507 BY SB Performed at [facility], [place] [address] [place], [place] [address]    Culture ENTEROCOCCUS FAECALIS (A)  Final   Report Status 08/14/2021 FINAL  Final   Organism ID, Bacteria ENTEROCOCCUS FAECALIS  Final      Susceptibility   Enterococcus faecalis - MIC*    AMPICILLIN <=2 SENSITIVE Sensitive     VANCOMYCIN 2 SENSITIVE Sensitive     GENTAMICIN SYNERGY SENSITIVE Sensitive     * ENTEROCOCCUS FAECALIS  Blood Culture ID Panel (Reflexed)     Status: Abnormal   Collection Time: 08/11/21  5:47 PM  Result Value Ref Range Status   Enterococcus faecalis DETECTED (A) NOT DETECTED Final    Comment: CRITICAL RESULT CALLED TO, READ BACK BY AND VERIFIED WITH: JASON ROBBINS 08/12/21 @ 0507 BY SB    Enterococcus Faecium NOT DETECTED NOT DETECTED Final   Listeria monocytogenes NOT DETECTED NOT DETECTED Final   Staphylococcus species NOT DETECTED NOT DETECTED Final   Staphylococcus aureus (BCID) NOT  DETECTED NOT DETECTED Final   Staphylococcus epidermidis NOT DETECTED NOT DETECTED Final   Staphylococcus lugdunensis NOT DETECTED NOT DETECTED Final   Streptococcus species NOT DETECTED NOT DETECTED Final   Streptococcus agalactiae NOT DETECTED NOT DETECTED Final   Streptococcus pneumoniae NOT DETECTED NOT DETECTED Final   Streptococcus pyogenes NOT DETECTED NOT DETECTED Final   A.calcoaceticus-baumannii NOT DETECTED NOT DETECTED Final   Bacteroides fragilis NOT DETECTED NOT DETECTED Final   Enterobacterales NOT DETECTED NOT DETECTED Final   Enterobacter cloacae complex NOT DETECTED NOT DETECTED Final   Escherichia coli NOT DETECTED NOT DETECTED Final   Klebsiella aerogenes NOT DETECTED NOT DETECTED Final   Klebsiella oxytoca NOT DETECTED NOT DETECTED Final   Klebsiella pneumoniae NOT DETECTED NOT DETECTED Final   Proteus species NOT DETECTED NOT DETECTED Final   Salmonella species NOT DETECTED NOT DETECTED Final   Serratia marcescens NOT DETECTED NOT DETECTED Final   Haemophilus influenzae NOT DETECTED NOT DETECTED Final   Neisseria meningitidis NOT DETECTED NOT DETECTED Final   Pseudomonas aeruginosa NOT DETECTED NOT DETECTED Final   Stenotrophomonas maltophilia NOT DETECTED NOT DETECTED Final   Candida albicans NOT DETECTED NOT DETECTED Final   Candida auris NOT DETECTED NOT DETECTED Final   Candida glabrata NOT DETECTED NOT DETECTED Final   Candida krusei NOT DETECTED NOT DETECTED Final   Candida parapsilosis NOT DETECTED NOT DETECTED Final   Candida tropicalis NOT DETECTED NOT DETECTED Final   Cryptococcus neoformans/gattii NOT DETECTED NOT DETECTED Final   Vancomycin resistance NOT DETECTED NOT DETECTED Final    Comment: Performed at [facility], [address] [place], [place] [address]  CULTURE, BLOOD (ROUTINE X 2) w Reflex to ID Panel     Status: None   Collection Time: 08/12/21 11:58 PM   Specimen: BLOOD  Result Value Ref Range Status   Specimen Description  BLOOD BLOOD LEFT HAND  Final   Special Requests   Final    BOTTLES DRAWN AEROBIC AND ANAEROBIC Blood Culture adequate volume   Culture   Final    NO GROWTH 5 DAYS Performed at [facility], [address] [place], [place] [address]    Report Status 08/18/2021 FINAL  Final  CULTURE, BLOOD (ROUTINE X 2) w Reflex to ID Panel     Status: None   Collection Time: 08/12/21 11:58 PM   Specimen: BLOOD  Result Value Ref Range Status   Specimen Description BLOOD LEFT RADIAL  Final   Special Requests   Final    BOTTLES DRAWN AEROBIC AND ANAEROBIC Blood Culture results may not be optimal due to an inadequate volume of blood received in culture bottles   Culture   Final    NO GROWTH 5 DAYS Performed at [facility], [address] [place], [place] [address]    Report Status 08/18/2021 FINAL  Final  Resp Panel by RT-PCR (Flu A&B, Covid) Nasopharyngeal Swab     Status: None   Collection Time: 08/21/21  9:50 AM   Specimen: Nasopharyngeal Swab; Nasopharyngeal(NP) swabs in vial transport medium  Result Value Ref Range Status   SARS Coronavirus 2 by RT PCR NEGATIVE NEGATIVE Final    Comment: (NOTE) SARS-CoV-2 target nucleic acids are NOT DETECTED.  The SARS-CoV-2 RNA is generally detectable in upper respiratory specimens during the acute phase of infection. The lowest concentration of SARS-CoV-2 viral copies this assay can detect is 138 copies/mL. A negative result does not preclude SARS-Cov-2 infection and should not be used as the sole basis for treatment or other patient management decisions. A negative result may occur with  improper specimen collection/handling, submission of specimen other than nasopharyngeal swab, presence of viral mutation(s) within the areas targeted by this assay, and inadequate number of viral copies(<138 copies/mL). A negative result must be combined with clinical observations, patient history, and epidemiological information. The expected result  is Negative.  Fact Sheet for Patients:  EntrepreneurPulse.com.au  Fact Sheet for Healthcare Providers:  IncredibleEmployment.be  This test is no t yet approved or cleared by the [place] FDA and  has been authorized for detection and/or diagnosis of SARS-CoV-2 by FDA under an Emergency Use Authorization (EUA). This EUA will remain  in effect (meaning this test can be used) for the duration of the COVID-19 declaration under Section 564(b)(1) of the Act, 21 U.S.C.section 360bbb-3(b)(1), unless the authorization is terminated  or revoked sooner.       Influenza A by PCR NEGATIVE NEGATIVE Final   Influenza B by PCR NEGATIVE NEGATIVE Final    Comment: (NOTE) The Xpert Xpress SARS-CoV-2/FLU/RSV plus assay is intended as an aid in the diagnosis of influenza from Nasopharyngeal swab specimens and should not be used as a sole basis for treatment. Nasal washings and aspirates are unacceptable for Xpert Xpress SARS-CoV-2/FLU/RSV testing.  Fact Sheet for Patients: EntrepreneurPulse.com.au  Fact Sheet for Healthcare Providers: IncredibleEmployment.be  This test is not yet approved or cleared by the [place] FDA and has been authorized for detection and/or diagnosis of SARS-CoV-2 by FDA under an Emergency Use Authorization (EUA). This EUA will remain in effect (meaning this test can be used) for the duration of the COVID-19 declaration under Section 564(b)(1) of the Act, 21 U.S.C. section 360bbb-3(b)(1), unless the authorization is terminated or revoked.  Performed at [facility], [address] [place], [place] [address]   MRSA Next Gen by PCR, Nasal     Status: None   Collection Time: 08/30/21 12:00 PM   Specimen: Nasal Mucosa; Nasal Swab  Result Value Ref Range Status   MRSA by PCR Next Gen NOT DETECTED NOT DETECTED Final    Comment: (NOTE) The GeneXpert MRSA Assay (FDA approved for  NASAL specimens only), is one component of a comprehensive MRSA colonization surveillance program. It is not intended to diagnose MRSA infection nor to guide or monitor treatment for MRSA infections. Test performance is not FDA approved in patients less than 82 years old. Performed at [facility], [phone] [place], [place] [address]     Coagulation Studies: No results for input(s): LABPROT, INR in the last 72 hours.  Urinalysis: No results for input(s): COLORURINE, LABSPEC, PHURINE, GLUCOSEU, HGBUR, BILIRUBINUR, KETONESUR, PROTEINUR, UROBILINOGEN, NITRITE, LEUKOCYTESUR in the last 72 hours.  Invalid input(s): APPERANCEUR    Imaging: CT HEAD WO CONTRAST (5MM)  Result Date: 08/29/2021 CLINICAL DATA:  Neuro deficit, stroke suspected EXAM: CT HEAD WITHOUT CONTRAST TECHNIQUE: Contiguous axial images were obtained from the base of the skull through the vertex without intravenous contrast. COMPARISON:  08/25/2021 FINDINGS: Brain: No evidence of acute infarction, hemorrhage, hydrocephalus, extra-axial collection or mass lesion/mass effect. Vascular: No hyperdense vessel. Skull: Negative for fracture or focal lesion. Sinuses/Orbits: No acute finding. Other: The mastoids are well aerated. IMPRESSION: No acute intracranial process. Electronically Signed   By: Merilyn Baba M.D.   On: 08/29/2021 21:47   IR Fluoro Guide CV Line Left  Result Date: 08/29/2021 INDICATION: 60 year old gentleman who requires long-term central venous access for antibiotic therapy returns to IR for placement of retracted left power line. Previous placement was quite difficult due to extensive central venous stenosis. Catheter tip was left in azygous vein. EXAM: Fluoroscopy guided replacement of retracted power line MEDICATIONS: None ANESTHESIA/SEDATION: None FLUOROSCOPY TIME:  Fluoroscopy Time: 1 minutes 42 seconds (13.2 mGy). COMPLICATIONS: None immediate. PROCEDURE: Informed written consent was obtained  from the patient's family after a thorough discussion of the procedural risks, benefits and alternatives. All questions were addressed. Maximal Sterile Barrier Technique was utilized including caps, mask, sterile gowns, sterile gloves, sterile drape, hand hygiene and skin antiseptic. A timeout was performed prior to the initiation of the procedure. Patient positioned supine on the procedure table. The external segment of the retracted left anterior power line and surrounding skin prepped and draped in usual fashion. Scout image demonstrated the catheter tip to be slightly retracted. The catheter did aspirate after removing the retention suture. Given that the cuff was already exposed, decision was made to replace the single-lumen power line. The existing power line was removed over 0.035 inch stiff Glidewire. New power line was cut to same length of 34 cm and inserted over the stiff glidewire. The tip was positioned in the azygous vein at the level of the mid chest. The catheter aspirated and flushed well. The catheter was secured to skin with silk suture and covered with bio patch and sterile dressing. IMPRESSION: Successful exchange of retracted left IJ power line. New Powerline ready for use. Patient has extensive central venous occlusion and the only central venous structure amenable to catheter placement was the azygous vein. If this catheter becomes nonfunctional or is retracted , contrast-enhanced chest CT should be performed to determine degree of venous stenosis. If clinically warranted, venous recanalization may be considered. A venous recanalization is not possible, and the patient still requires central venous access, femoral approach central venous catheter should be considered. Electronically Signed   By: Miachel Roux M.D.   On: 08/29/2021 16:20   DG Chest Port 1 View  Result Date: 08/30/2021 CLINICAL DATA:  Shortness of breath EXAM: PORTABLE CHEST 1 VIEW COMPARISON:  08/29/2021 FINDINGS: There is  a large bore central venous catheter with tip in the inferior right atrium. Again seen is a left IJ catheter. The distal portion of the catheter has an unusual orientation consistent with azygous vein placement. Stable cardiac enlargement and aortic atherosclerosis. Again seen is volume loss throughout the right hemithorax with diffuse pleural thickening and calcification. Superimposed pulmonary vascular congestion  noted. Atelectasis is identified within the lung bases. IMPRESSION: 1. Cardiac enlargement and pulmonary vascular congestion. 2. Persistent volume loss throughout the right hemithorax with diffuse pleural thickening and calcification. 3. Bibasilar atelectasis and probable small right pleural effusion. Electronically Signed   By: Kerby Moors M.D.   On: 08/30/2021 07:29   DG Chest Port 1 View  Result Date: 08/29/2021 CLINICAL DATA:  Post PICC line placement in a 60 year old male. EXAM: PORTABLE CHEST 1 VIEW COMPARISON:  08/11/2021. FINDINGS: Dialysis catheter again terminates in the mid to lower RIGHT atrium. Interval placement of a LEFT-sided central venous access device, tip orientation suggest placement within the azygous vein. Correlation with recent imaging report from placement states this is intended site of this catheter. Cardiomediastinal contours with stable cardiac enlargement. Pleural calcification and pleural fluid in the RIGHT chest with similar appearance. No signs of new lobar consolidation or pneumothorax. Signs of renal osteodystrophy without acute skeletal process on limited assessment. Evidence of prior trauma to RIGHT ribs as before. IMPRESSION: Dialysis catheter again terminates in the mid to lower RIGHT atrium. Interval placement of a LEFT-sided central venous access device, tip orientation suggests placement within the azygous vein. This appears to be the intended placement due to central venous occlusion involving distal SVC as outlined in the previous interventional  radiology report. Otherwise stable appearance of the chest with chronic volume loss and pleuroparenchymal scarring/calcification in the RIGHT chest. Electronically Signed   By: Zetta Bills M.D.   On: 08/29/2021 10:32   EEG adult  Result Date: 08/30/2021 Derek Jack, MD     08/30/2021  6:24 PM Routine EEG Report [name] [name] is a 60 y.o. male with a history of altered mental status who is undergoing an EEG to evaluate for seizures. Report: This EEG was acquired with electrodes placed according to the International 10-20 electrode system (including Fp1, Fp2, F3, F4, C3, C4, P3, P4, O1, O2, T3, T4, T5, T6, A1, A2, Fz, Cz, Pz). The following electrodes were missing or displaced: none. The occipital dominant rhythm was 5-7 Hz. This activity is reactive to stimulation. Drowsiness was manifested by background fragmentation; deeper stages of sleep were manifested by K complexes and sleep spindles. There was no focal slowing. There were no interictal epileptiform discharges. There were no electrographic seizures identified. Photic stimulation and hyperventilation were not performed. Impression and clinical correlation: This EEG was obtained while awake and asleep and is abnormal due to moderate diffuse slowing, indicative of global cerebral dysfunction. Su Monks, MD [facility] [phone] If 7pm- 7am, please page neurology on call as listed in [place].     Medications:    sodium chloride 250 mL (08/27/21 2052)   ampicillin (OMNIPEN) IV 2 g (08/30/21 2353)   cefTRIAXone (ROCEPHIN)  IV 2 g (08/30/21 2220)     apixaban  2.5 mg Oral BID   Chlorhexidine Gluconate Cloth  6 each Topical Q0600   insulin aspart  0-6 Units Subcutaneous Q4H   lidocaine  2 patch Transdermal Q24H   nicotine  21 mg Transdermal Daily   pantoprazole (PROTONIX) IV  40 mg Intravenous Q24H   phenytoin (DILANTIN) IV  100 mg Intravenous Q8H   sevelamer carbonate  800 mg Oral TID WC   sodium chloride, acetaminophen  **OR** acetaminophen, antiseptic oral rinse, bisacodyl, glycopyrrolate, haloperidol **OR** haloperidol **OR** haloperidol lactate, hydrALAZINE, LORazepam, Muscle Rub, ondansetron (ZOFRAN) IV, polyethylene glycol, polyvinyl alcohol, sodium chloride flush  Assessment/ Plan:  [name] [name] is a 60 y.o. black male  with end stage renal disease on hemodialysis, hypertension, congestive heart failure, substance abuse who is admitted to [facility] on 08/08/2021 for Lower back pain [M54.50] Acute midline low back pain without sciatica [M54.50] At risk for inadequate pain control [Z91.89] Back pain [M54.9] Found to have eneterococcus faecalis bacteremia and osteomyelitis of C5-C6.   [facility] Right AVG Hero 90.5kg  #ESRD on HD TTS Patient was due to being placed in rehab in [place], he will receive dialysis at [facility] on a TTS schedule.  -Very poor oral intake.  No evidence of volume overload.  Electrolytes are acceptable.  Blood pressure is low.  Plan to skip hemodialysis today.  Next dialysis planned for Saturday -Eliquis 2.5 twice a day to prevent dialysis access clotting  #Hypotension BP staying in the 90s.  Not on pressors at the moment  #Anemia of CKD Lab Results  Component Value Date   HGB 9.5 (L) 08/30/2021  Continue Low dose Epogen with HD treatments  #Secondary Hyperparathyroidism Lab Results  Component Value Date   PTH 583.2 (H) 12/11/2012   CALCIUM 9.3 08/30/2021   CAION 1.27 (H) 12/26/2012   PHOS 7.8 (H) 08/28/2021   Calcium at goal. Phosphorus remains elevated. 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  #Altered Mental Status Continue delirium precautions.  MRI brain with possible acute infarcts in posterior left temporal lobe and  right insular cortex.  Mild chronic white matter microangiopathy Last Ativan given 08/30/2021 at 12:20 PM EEG suggests seizures.   LOS: [place] 9/22/202210:01 AM

## 2021-08-31 NOTE — Progress Notes (Signed)
Subjective: Continues to be lethargic.   Objective: Current vital signs: BP (!) 91/52   Pulse 75   Temp 98.7 F (37.1 C)   Resp 10   Ht 6\' 1"  (1.854 m)   Wt 91 kg   SpO2 100%   BMI 26.47 kg/m  Vital signs in last 24 hours: Temp:  [98.2 F (36.8 C)-98.9 F (37.2 C)] 98.7 F (37.1 C) (09/22 0500) Pulse Rate:  [25-88] 75 (09/22 0600) Resp:  [0-24] 10 (09/22 0615) BP: (71-116)/(37-56) 91/52 (09/22 0615) SpO2:  [95 %-100 %] 100 % (09/22 0615)  Intake/Output from previous day: 09/21 0701 - 09/22 0700 In: 200 [IV Piggyback:200] Out: -  Intake/Output this shift: No intake/output data recorded. Nutritional status:  Diet Order             DIET - DYS 1 Room service appropriate? Yes; Fluid consistency: Thin  Diet effective now           Diet - low sodium heart healthy                  HEENT: [place]/AT. Torticollis with head turned to right noted; muscle spasm improves with gentle left ward rotation. No nuchal rigidity. Negative Kernig and Brudzinski signs.  Lungs: Respirations unlabored Ext: No edema   Neurologic Exam: Ment: Asleep initially, the patient arouses to a somnolent state with sternal rub. Requires repeated stimulation to attend. During maximal arousal, he still appears drowsy/lethargic and has increased latencies of motor responses to stimuli. Did not follow any commands or answer any questions. No spontaneous verbal output. Overall, mentation is worse compared to yesterday.  CN: Mildly dysconjugate gaze (esotropic). Has difficulty tracking. Face is flaccidly symmetric.   Motor: Flexes upper extremities towards chest with sternal rub. Withdraws BLE to noxious plantar stimulation. No asymmetry.   Sensory: Reacts to plantar irritative stimuli bilaterally.  Reflexes: Symmetric Cerebellar/Gait: Unable to assess.  Other: Intermittent brow-twitching seen yesterday has resolved. Today, some erratic upper extremity movements suggestive of either low-amplitude myoclonus or  asterixis are seen.   Lab Results: Results for orders placed or performed during the hospital encounter of 08/08/21 (from the past 48 hour(s))  Glucose, capillary     Status: None   Collection Time: 08/29/21  8:04 PM  Result Value Ref Range   Glucose-Capillary 99 70 - 99 mg/dL    Comment: Glucose reference range applies only to samples taken after fasting for at least 8 hours.  CBC     Status: Abnormal   Collection Time: 08/30/21  4:20 AM  Result Value Ref Range   WBC 9.4 4.0 - 10.5 K/uL   RBC 3.05 (L) 4.22 - 5.81 MIL/uL   Hemoglobin 9.5 (L) 13.0 - 17.0 g/dL   HCT 30.8 (L) 39.0 - 52.0 %   MCV 101.0 (H) 80.0 - 100.0 fL   MCH 31.1 26.0 - 34.0 pg   MCHC 30.8 30.0 - 36.0 g/dL   RDW 23.8 (H) 11.5 - 15.5 %   Platelets 117 (L) 150 - 400 K/uL    Comment: Immature Platelet Fraction may be clinically indicated, consider ordering this additional test [id number] CONSISTENT WITH PREVIOUS RESULT    nRBC 1.4 (H) 0.0 - 0.2 %    Comment: Performed at [facility], [address], [place], [place] [address]  Basic metabolic panel     Status: Abnormal   Collection Time: 08/30/21  4:20 AM  Result Value Ref Range   Sodium 139 135 - 145 mmol/L   Potassium  4.6 3.5 - 5.1 mmol/L   Chloride 97 (L) 98 - 111 mmol/L   CO2 27 22 - 32 mmol/L   Glucose, Bld 79 70 - 99 mg/dL    Comment: Glucose reference range applies only to samples taken after fasting for at least 8 hours.   BUN 56 (H) 6 - 20 mg/dL   Creatinine, Ser 7.61 (H) 0.61 - 1.24 mg/dL   Calcium 9.3 8.9 - 10.3 mg/dL   GFR, Estimated 8 (L) >60 mL/min    Comment: (NOTE) Calculated using the CKD-EPI Creatinine Equation (2021)    Anion gap 15 5 - 15    Comment: Performed at [facility], [address] [place], [place] [address]  Glucose, capillary     Status: None   Collection Time: 08/30/21  6:07 AM  Result Value Ref Range   Glucose-Capillary 73 70 - 99 mg/dL    Comment: Glucose reference range applies only to samples  taken after fasting for at least 8 hours.  Glucose, capillary     Status: None   Collection Time: 08/30/21  6:38 AM  Result Value Ref Range   Glucose-Capillary 83 70 - 99 mg/dL    Comment: Glucose reference range applies only to samples taken after fasting for at least 8 hours.   Comment 1 Notify RN    Comment 2 Document in Chart   Troponin I (High Sensitivity)     Status: Abnormal   Collection Time: 08/30/21  8:20 AM  Result Value Ref Range   Troponin I (High Sensitivity) 437 (HH) <18 ng/L    Comment: CRITICAL RESULT CALLED TO, READ BACK BY AND VERIFIED WITH SHANNON STARKS  08/30/21 @ 0915 BY SB. (NOTE) Elevated high sensitivity troponin I (hsTnI) values and significant  changes across serial measurements may suggest ACS but many other  chronic and acute conditions are known to elevate hsTnI results.  Refer to the "Links" section for chest pain algorithms and additional  guidance. Performed at [facility], [place], [place] [address]   Troponin I (High Sensitivity)     Status: Abnormal   Collection Time: 08/30/21 10:26 AM  Result Value Ref Range   Troponin I (High Sensitivity) 374 (HH) <18 ng/L    Comment: CRITICAL RESULT CALLED TO, READ BACK BY AND VERIFIED WITH SHANNON STARK 08/30/21 @ 1128 BY SB (NOTE) Elevated high sensitivity troponin I (hsTnI) values and significant  changes across serial measurements may suggest ACS but many other  chronic and acute conditions are known to elevate hsTnI results.  Refer to the "Links" section for chest pain algorithms and additional  guidance. Performed at [facility], [address] [place], [place] [address]   MRSA Next Gen by PCR, Nasal     Status: None   Collection Time: 08/30/21 12:00 PM   Specimen: Nasal Mucosa; Nasal Swab  Result Value Ref Range   MRSA by PCR Next Gen NOT DETECTED NOT DETECTED    Comment: (NOTE) The GeneXpert MRSA Assay (FDA approved for NASAL specimens only), is one  component of a comprehensive MRSA colonization surveillance program. It is not intended to diagnose MRSA infection nor to guide or monitor treatment for MRSA infections. Test performance is not FDA approved in patients less than 78 years old. Performed at [facility], [address] [place], [place] [address]   Glucose, capillary     Status: None   Collection Time: 08/31/21  7:49 AM  Result Value Ref Range  Glucose-Capillary 78 70 - 99 mg/dL    Comment: Glucose reference range applies only to samples taken after fasting for at least 8 hours.    Recent Results (from the past 240 hour(s))  Resp Panel by RT-PCR (Flu A&B, Covid) Nasopharyngeal Swab     Status: None   Collection Time: 08/21/21  9:50 AM   Specimen: Nasopharyngeal Swab; Nasopharyngeal(NP) swabs in vial transport medium  Result Value Ref Range Status   SARS Coronavirus 2 by RT PCR NEGATIVE NEGATIVE Final    Comment: (NOTE) SARS-CoV-2 target nucleic acids are NOT DETECTED.  The SARS-CoV-2 RNA is generally detectable in upper respiratory specimens during the acute phase of infection. The lowest concentration of SARS-CoV-2 viral copies this assay can detect is 138 copies/mL. A negative result does not preclude SARS-Cov-2 infection and should not be used as the sole basis for treatment or other patient management decisions. A negative result may occur with  improper specimen collection/handling, submission of specimen other than nasopharyngeal swab, presence of viral mutation(s) within the areas targeted by this assay, and inadequate number of viral copies(<138 copies/mL). A negative result must be combined with clinical observations, patient history, and epidemiological information. The expected result is Negative.  Fact Sheet for Patients:  EntrepreneurPulse.com.au  Fact Sheet for Healthcare Providers:  IncredibleEmployment.be  This test is no t yet approved or cleared  by the [place] FDA and  has been authorized for detection and/or diagnosis of SARS-CoV-2 by FDA under an Emergency Use Authorization (EUA). This EUA will remain  in effect (meaning this test can be used) for the duration of the COVID-19 declaration under Section 564(b)(1) of the Act, 21 U.S.C.section 360bbb-3(b)(1), unless the authorization is terminated  or revoked sooner.       Influenza A by PCR NEGATIVE NEGATIVE Final   Influenza B by PCR NEGATIVE NEGATIVE Final    Comment: (NOTE) The Xpert Xpress SARS-CoV-2/FLU/RSV plus assay is intended as an aid in the diagnosis of influenza from Nasopharyngeal swab specimens and should not be used as a sole basis for treatment. Nasal washings and aspirates are unacceptable for Xpert Xpress SARS-CoV-2/FLU/RSV testing.  Fact Sheet for Patients: EntrepreneurPulse.com.au  Fact Sheet for Healthcare Providers: IncredibleEmployment.be  This test is not yet approved or cleared by the [place] FDA and has been authorized for detection and/or diagnosis of SARS-CoV-2 by FDA under an Emergency Use Authorization (EUA). This EUA will remain in effect (meaning this test can be used) for the duration of the COVID-19 declaration under Section 564(b)(1) of the Act, 21 U.S.C. section 360bbb-3(b)(1), unless the authorization is terminated or revoked.  Performed at [facility], [address] [place], [place] [address]   MRSA Next Gen by PCR, Nasal     Status: None   Collection Time: 08/30/21 12:00 PM   Specimen: Nasal Mucosa; Nasal Swab  Result Value Ref Range Status   MRSA by PCR Next Gen NOT DETECTED NOT DETECTED Final    Comment: (NOTE) The GeneXpert MRSA Assay (FDA approved for NASAL specimens only), is one component of a comprehensive MRSA colonization surveillance program. It is not intended to diagnose MRSA infection nor to guide or monitor treatment for MRSA infections. Test  performance is not FDA approved in patients less than 12 years old. Performed at [facility], [address] [place], [place] [address]     Lipid Panel No results for input(s): CHOL, TRIG, HDL, CHOLHDL, VLDL, LDLCALC in the last 72 hours.  Studies/Results: CT HEAD WO CONTRAST (  5MM)  Result Date: 08/29/2021 CLINICAL DATA:  Neuro deficit, stroke suspected EXAM: CT HEAD WITHOUT CONTRAST TECHNIQUE: Contiguous axial images were obtained from the base of the skull through the vertex without intravenous contrast. COMPARISON:  08/25/2021 FINDINGS: Brain: No evidence of acute infarction, hemorrhage, hydrocephalus, extra-axial collection or mass lesion/mass effect. Vascular: No hyperdense vessel. Skull: Negative for fracture or focal lesion. Sinuses/Orbits: No acute finding. Other: The mastoids are well aerated. IMPRESSION: No acute intracranial process. Electronically Signed   By: Merilyn Baba M.D.   On: 08/29/2021 21:47   IR Fluoro Guide CV Line Left  Result Date: 08/29/2021 INDICATION: 60 year old gentleman who requires long-term central venous access for antibiotic therapy returns to IR for placement of retracted left power line. Previous placement was quite difficult due to extensive central venous stenosis. Catheter tip was left in azygous vein. EXAM: Fluoroscopy guided replacement of retracted power line MEDICATIONS: None ANESTHESIA/SEDATION: None FLUOROSCOPY TIME:  Fluoroscopy Time: 1 minutes 42 seconds (13.2 mGy). COMPLICATIONS: None immediate. PROCEDURE: Informed written consent was obtained from the patient's family after a thorough discussion of the procedural risks, benefits and alternatives. All questions were addressed. Maximal Sterile Barrier Technique was utilized including caps, mask, sterile gowns, sterile gloves, sterile drape, hand hygiene and skin antiseptic. A timeout was performed prior to the initiation of the procedure. Patient positioned supine on the procedure table.  The external segment of the retracted left anterior power line and surrounding skin prepped and draped in usual fashion. Scout image demonstrated the catheter tip to be slightly retracted. The catheter did aspirate after removing the retention suture. Given that the cuff was already exposed, decision was made to replace the single-lumen power line. The existing power line was removed over 0.035 inch stiff Glidewire. New power line was cut to same length of 34 cm and inserted over the stiff glidewire. The tip was positioned in the azygous vein at the level of the mid chest. The catheter aspirated and flushed well. The catheter was secured to skin with silk suture and covered with bio patch and sterile dressing. IMPRESSION: Successful exchange of retracted left IJ power line. New Powerline ready for use. Patient has extensive central venous occlusion and the only central venous structure amenable to catheter placement was the azygous vein. If this catheter becomes nonfunctional or is retracted , contrast-enhanced chest CT should be performed to determine degree of venous stenosis. If clinically warranted, venous recanalization may be considered. A venous recanalization is not possible, and the patient still requires central venous access, femoral approach central venous catheter should be considered. Electronically Signed   By: Miachel Roux M.D.   On: 08/29/2021 16:20   DG Chest Port 1 View  Result Date: 08/30/2021 CLINICAL DATA:  Shortness of breath EXAM: PORTABLE CHEST 1 VIEW COMPARISON:  08/29/2021 FINDINGS: There is a large bore central venous catheter with tip in the inferior right atrium. Again seen is a left IJ catheter. The distal portion of the catheter has an unusual orientation consistent with azygous vein placement. Stable cardiac enlargement and aortic atherosclerosis. Again seen is volume loss throughout the right hemithorax with diffuse pleural thickening and calcification. Superimposed pulmonary  vascular congestion noted. Atelectasis is identified within the lung bases. IMPRESSION: 1. Cardiac enlargement and pulmonary vascular congestion. 2. Persistent volume loss throughout the right hemithorax with diffuse pleural thickening and calcification. 3. Bibasilar atelectasis and probable small right pleural effusion. Electronically Signed   By: Kerby Moors M.D.   On: 08/30/2021 07:29   DG Chest  Port 1 View  Result Date: 08/29/2021 CLINICAL DATA:  Post PICC line placement in a 60 year old male. EXAM: PORTABLE CHEST 1 VIEW COMPARISON:  08/11/2021. FINDINGS: Dialysis catheter again terminates in the mid to lower RIGHT atrium. Interval placement of a LEFT-sided central venous access device, tip orientation suggest placement within the azygous vein. Correlation with recent imaging report from placement states this is intended site of this catheter. Cardiomediastinal contours with stable cardiac enlargement. Pleural calcification and pleural fluid in the RIGHT chest with similar appearance. No signs of new lobar consolidation or pneumothorax. Signs of renal osteodystrophy without acute skeletal process on limited assessment. Evidence of prior trauma to RIGHT ribs as before. IMPRESSION: Dialysis catheter again terminates in the mid to lower RIGHT atrium. Interval placement of a LEFT-sided central venous access device, tip orientation suggests placement within the azygous vein. This appears to be the intended placement due to central venous occlusion involving distal SVC as outlined in the previous interventional radiology report. Otherwise stable appearance of the chest with chronic volume loss and pleuroparenchymal scarring/calcification in the RIGHT chest. Electronically Signed   By: Zetta Bills M.D.   On: 08/29/2021 10:32   EEG adult  Result Date: 08/30/2021 Derek Jack, MD     08/30/2021  6:24 PM Routine EEG Report [name] [name] is a 60 y.o. male with a history of altered mental status who is  undergoing an EEG to evaluate for seizures. Report: This EEG was acquired with electrodes placed according to the International 10-20 electrode system (including Fp1, Fp2, F3, F4, C3, C4, P3, P4, O1, O2, T3, T4, T5, T6, A1, A2, Fz, Cz, Pz). The following electrodes were missing or displaced: none. The occipital dominant rhythm was 5-7 Hz. This activity is reactive to stimulation. Drowsiness was manifested by background fragmentation; deeper stages of sleep were manifested by K complexes and sleep spindles. There was no focal slowing. There were no interictal epileptiform discharges. There were no electrographic seizures identified. Photic stimulation and hyperventilation were not performed. Impression and clinical correlation: This EEG was obtained while awake and asleep and is abnormal due to moderate diffuse slowing, indicative of global cerebral dysfunction. Su Monks, MD [facility] [phone] If 7pm- 7am, please page neurology on call as listed in [place].    Medications: Scheduled:  apixaban  2.5 mg Oral BID   Chlorhexidine Gluconate Cloth  6 each Topical Q0600   insulin aspart  0-6 Units Subcutaneous Q4H   lidocaine  2 patch Transdermal Q24H   nicotine  21 mg Transdermal Daily   pantoprazole (PROTONIX) IV  40 mg Intravenous Q24H   phenytoin (DILANTIN) IV  100 mg Intravenous Q8H   sevelamer carbonate  800 mg Oral TID WC   Continuous:  sodium chloride 250 mL (08/27/21 2052)   ampicillin (OMNIPEN) IV 2 g (08/30/21 2353)   cefTRIAXone (ROCEPHIN)  IV 2 g (08/30/21 2220)   Assessment: 60 yo with ESRD, HTN, tobacco and poly substance abuse presented with worsening lower back pain. Found to have sepsis 2/2 e. 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 Neurology consulted for possible punctate acute/subacute infarcts in L temporal lobe and R insula. Also with waxing and waning mentation, which improved  after fosphenytoin load yesterday; subclinical seizures, possibly recurrent focal unaware seizures are now a more prominent component of the DDx.  - Exam today shows somewhat worsened encephalopathy compared to yesterday.    - Punctate infarcts on MRI. They  are favored to be incidental given that they are extremely small. Stroke workup is completed. 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 - Encephalopathy. Sepsis is felt to be the major contributing factor to his encephalopathy. Routine EEG does show continuous generalized slowing, consistent with an encephalopathy. However, the EEG Monday also revealed a lateralized component to the slowing, involving his right cerebral hemisphere. This would not be due to his strokes, which are too small to significantly impact the EEG. There is no large area of encephalomalacia in his right hemisphere to account for this either. DDx for the focal slowing includes focal subclinical seizure activity.   - Event Tuesday (9/20) suggestive of seizure: After returning to the floor from dialysis yesterday night, the patient was with decreased responsiveness to questions and commands, but with eyes were open and he was localizing as well as groaning to sternal rub. 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 STAT CT was negative for acute abnormality. He was loaded with fosphenytoin after CT. Regarding possible alternative IV anticonvulsants, Keppra not an optimal choice given ESRD. Valproic not a good choice given national shortage of IV formulation.  - EEG from yesterday (9/21): This EEG was obtained while awake and asleep and is abnormal due to moderate diffuse slowing, indicative of global cerebral dysfunction. - Contacted by Pharmacy regarding possible clinically significant interaction between phenytoin and Eliquis. Food and Drug Administration package labels recommend avoiding concurrent use of apixaban and rivaroxaban with  P-gp and CYP3A4 inducers (which includes the anticonvulsants carbamazepine, phenytoin, and phenobarbital), as Eliquis is metabolized by CYP3A4 (20-25%).  - EKG 9/21: Sinus rhythm, Probable left atrial enlargement, Probable anteroseptal infarct, recent; PR interval normal at 175 ms    Receommendations: - Agree with continuation of Eliquis  - Switching Dilantin to Vimpat 100 mg IV BID following an IV load of 200 mg x 1. Will need to monitor PR interval on EKG.  - Will consider addition of valproic acid or renally dosed Keppra if he is not continuing to improve by tomorrow AM.   - Would benefit from LTM EEG, which is not available at [facility] - Parents stated that they are consenting to have the patient transferred to [facility] for LTM EEG - Process of arranging for transfer to [facility] has been initiated by ICU team at [facility]. No beds currently. He is on the Neurohospitalist service pending transfer list at [facility]. - Family wants full medical care in the context of patient being DNR.   35 minutes spent in the neurological evaluation and management of this critically ill patient.     LOS: 22 days   @Electronically  signed: Dr. Kerney Elbe 08/31/2021  8:41 AM

## 2021-08-31 NOTE — Progress Notes (Signed)
PT Cancellation Note  Patient Details Name: [name] [name] MRN: [id number] DOB: [birth year]   Cancelled Treatment:    Reason Eval/Treat Not Completed: Other (comment).  Pt transitioned to comfort measures. PT order discontinued by palliative provider. Will sign off at this time. Thank you.    [name], PT, DPT 08/31/21, 10:49 AM

## 2021-08-31 NOTE — Procedures (Signed)
Routine EEG Report  [name] [name] is a 60 y.o. male with a history of encephalopathy who is undergoing an EEG to evaluate for seizures.  Report: This EEG was acquired with electrodes placed according to the International 10-20 electrode system (including Fp1, Fp2, F3, F4, C3, C4, P3, P4, O1, O2, T3, T4, T5, T6, A1, A2, Fz, Cz, Pz). The following electrodes were missing or displaced: none.  The occipital dominant rhythm was 5-6 Hz. There is intermittent diffuse slowing in the delta range. At other times there is intermittent focal right sided slowing. This activity is reactive to stimulation. Drowsiness was manifested by background fragmentation; sleep architecture manifested as K complexes and sleep spindles. There were no interictal epileptiform discharges. There were no electrographic seizures identified. Photic stimulation and hyperventilation were not performed.  Impression: This EEG was obtained while obtunded and while asleep and is abnormal due to: - Moderate diffuse slowing with intermittent severe diffuse slowing, indicative of global cerebral dysfunction - Intermittent right focal slowing, indicative of focal cerebral dysfunction in that region  Su Monks, MD [facility] [phone]  If 7pm- 7am, please page neurology on call as listed in [place].

## 2021-08-31 NOTE — Progress Notes (Signed)
Eeg done 

## 2021-09-01 ENCOUNTER — Inpatient Hospital Stay (HOSPITAL_COMMUNITY): Payer: Medicare Other

## 2021-09-01 ENCOUNTER — Inpatient Hospital Stay (HOSPITAL_COMMUNITY)
Admission: AD | Admit: 2021-09-01 | Discharge: 2021-09-09 | DRG: 871 | Disposition: E | Payer: Medicare Other | Source: Other Acute Inpatient Hospital | Attending: Internal Medicine | Admitting: Internal Medicine

## 2021-09-01 DIAGNOSIS — Z833 Family history of diabetes mellitus: Secondary | ICD-10-CM

## 2021-09-01 DIAGNOSIS — Z79899 Other long term (current) drug therapy: Secondary | ICD-10-CM

## 2021-09-01 DIAGNOSIS — F17210 Nicotine dependence, cigarettes, uncomplicated: Secondary | ICD-10-CM | POA: Diagnosis present

## 2021-09-01 DIAGNOSIS — Z6824 Body mass index (BMI) 24.0-24.9, adult: Secondary | ICD-10-CM

## 2021-09-01 DIAGNOSIS — Z8249 Family history of ischemic heart disease and other diseases of the circulatory system: Secondary | ICD-10-CM

## 2021-09-01 DIAGNOSIS — Z888 Allergy status to other drugs, medicaments and biological substances status: Secondary | ICD-10-CM

## 2021-09-01 DIAGNOSIS — F32A Depression, unspecified: Secondary | ICD-10-CM | POA: Diagnosis present

## 2021-09-01 DIAGNOSIS — F1910 Other psychoactive substance abuse, uncomplicated: Secondary | ICD-10-CM | POA: Diagnosis present

## 2021-09-01 DIAGNOSIS — Z66 Do not resuscitate: Secondary | ICD-10-CM | POA: Diagnosis present

## 2021-09-01 DIAGNOSIS — G9341 Metabolic encephalopathy: Secondary | ICD-10-CM | POA: Diagnosis not present

## 2021-09-01 DIAGNOSIS — G928 Other toxic encephalopathy: Secondary | ICD-10-CM | POA: Diagnosis not present

## 2021-09-01 DIAGNOSIS — I639 Cerebral infarction, unspecified: Secondary | ICD-10-CM | POA: Diagnosis not present

## 2021-09-01 DIAGNOSIS — I76 Septic arterial embolism: Secondary | ICD-10-CM | POA: Diagnosis present

## 2021-09-01 DIAGNOSIS — M4622 Osteomyelitis of vertebra, cervical region: Secondary | ICD-10-CM | POA: Diagnosis present

## 2021-09-01 DIAGNOSIS — M4620 Osteomyelitis of vertebra, site unspecified: Secondary | ICD-10-CM | POA: Diagnosis not present

## 2021-09-01 DIAGNOSIS — I38 Endocarditis, valve unspecified: Secondary | ICD-10-CM | POA: Diagnosis present

## 2021-09-01 DIAGNOSIS — R1312 Dysphagia, oropharyngeal phase: Secondary | ICD-10-CM | POA: Diagnosis not present

## 2021-09-01 DIAGNOSIS — I132 Hypertensive heart and chronic kidney disease with heart failure and with stage 5 chronic kidney disease, or end stage renal disease: Secondary | ICD-10-CM | POA: Diagnosis present

## 2021-09-01 DIAGNOSIS — R1310 Dysphagia, unspecified: Secondary | ICD-10-CM | POA: Diagnosis present

## 2021-09-01 DIAGNOSIS — I82C11 Acute embolism and thrombosis of right internal jugular vein: Secondary | ICD-10-CM

## 2021-09-01 DIAGNOSIS — Z8349 Family history of other endocrine, nutritional and metabolic diseases: Secondary | ICD-10-CM

## 2021-09-01 DIAGNOSIS — N2581 Secondary hyperparathyroidism of renal origin: Secondary | ICD-10-CM | POA: Diagnosis present

## 2021-09-01 DIAGNOSIS — R569 Unspecified convulsions: Secondary | ICD-10-CM | POA: Diagnosis not present

## 2021-09-01 DIAGNOSIS — M4646 Discitis, unspecified, lumbar region: Secondary | ICD-10-CM | POA: Diagnosis present

## 2021-09-01 DIAGNOSIS — D631 Anemia in chronic kidney disease: Secondary | ICD-10-CM | POA: Diagnosis present

## 2021-09-01 DIAGNOSIS — A4181 Sepsis due to Enterococcus: Secondary | ICD-10-CM | POA: Diagnosis present

## 2021-09-01 DIAGNOSIS — Z7189 Other specified counseling: Secondary | ICD-10-CM

## 2021-09-01 DIAGNOSIS — E43 Unspecified severe protein-calorie malnutrition: Secondary | ICD-10-CM | POA: Insufficient documentation

## 2021-09-01 DIAGNOSIS — N186 End stage renal disease: Secondary | ICD-10-CM | POA: Diagnosis present

## 2021-09-01 DIAGNOSIS — K6812 Psoas muscle abscess: Secondary | ICD-10-CM | POA: Diagnosis present

## 2021-09-01 DIAGNOSIS — I5042 Chronic combined systolic (congestive) and diastolic (congestive) heart failure: Secondary | ICD-10-CM | POA: Diagnosis present

## 2021-09-01 DIAGNOSIS — T17908A Unspecified foreign body in respiratory tract, part unspecified causing other injury, initial encounter: Secondary | ICD-10-CM

## 2021-09-01 DIAGNOSIS — Z515 Encounter for palliative care: Secondary | ICD-10-CM

## 2021-09-01 DIAGNOSIS — Z7901 Long term (current) use of anticoagulants: Secondary | ICD-10-CM | POA: Diagnosis not present

## 2021-09-01 DIAGNOSIS — R471 Dysarthria and anarthria: Secondary | ICD-10-CM | POA: Diagnosis present

## 2021-09-01 DIAGNOSIS — Z992 Dependence on renal dialysis: Secondary | ICD-10-CM | POA: Diagnosis not present

## 2021-09-01 DIAGNOSIS — G9340 Encephalopathy, unspecified: Secondary | ICD-10-CM | POA: Diagnosis not present

## 2021-09-01 DIAGNOSIS — I82C19 Acute embolism and thrombosis of unspecified internal jugular vein: Secondary | ICD-10-CM | POA: Diagnosis present

## 2021-09-01 DIAGNOSIS — I330 Acute and subacute infective endocarditis: Secondary | ICD-10-CM | POA: Diagnosis present

## 2021-09-01 DIAGNOSIS — G479 Sleep disorder, unspecified: Secondary | ICD-10-CM | POA: Diagnosis present

## 2021-09-01 DIAGNOSIS — Z4659 Encounter for fitting and adjustment of other gastrointestinal appliance and device: Secondary | ICD-10-CM

## 2021-09-01 LAB — GLUCOSE, CAPILLARY
Glucose-Capillary: 89 mg/dL (ref 70–99)
Glucose-Capillary: 105 mg/dL — ABNORMAL HIGH (ref 70–99)
Glucose-Capillary: 116 mg/dL — ABNORMAL HIGH (ref 70–99)
Glucose-Capillary: 105 mg/dL — ABNORMAL HIGH (ref 70–99)

## 2021-09-01 LAB — PHENYTOIN LEVEL, FREE AND TOTAL
Phenytoin, Free: 2.4 ug/mL — ABNORMAL HIGH (ref 1.0–2.0)
Phenytoin, Total: 10.6 ug/mL (ref 10.0–20.0)

## 2021-09-01 MED ORDER — SORBITOL 70 % SOLN: 30.0000 mL | Status: DC | PRN

## 2021-09-01 MED ORDER — AMPICILLIN IV (FOR PTA / DISCHARGE USE ONLY): 2.0000 g | Freq: Two times a day (BID) | INTRAVENOUS | Status: DC

## 2021-09-01 MED ORDER — LEVETIRACETAM IN NACL 1000 MG/100ML IV SOLN
1000.0000 mg | INTRAVENOUS | Status: AC
  Administered 2021-09-01: 1000 mg via INTRAVENOUS
  Filled 2021-09-01: qty 100

## 2021-09-01 MED ORDER — ONDANSETRON HCL 4 MG PO TABS: 4.0000 mg | ORAL_TABLET | Freq: Four times a day (QID) | ORAL | Status: DC | PRN

## 2021-09-01 MED ORDER — APIXABAN 2.5 MG PO TABS
2.5000 mg | ORAL_TABLET | Freq: Two times a day (BID) | ORAL | Status: DC
  Administered 2021-09-01 – 2021-09-05 (×9): 2.5 mg
  Filled 2021-09-01 (×9): qty 1

## 2021-09-01 MED ORDER — SODIUM CHLORIDE 0.9 % IV SOLN
2.0000 g | Freq: Two times a day (BID) | INTRAVENOUS | Status: DC
  Filled 2021-09-01: qty 20

## 2021-09-01 MED ORDER — ACETAMINOPHEN 650 MG RE SUPP: 650.0000 mg | Freq: Four times a day (QID) | RECTAL | Status: DC | PRN

## 2021-09-01 MED ORDER — DOCUSATE SODIUM 283 MG RE ENEM
1.0000 | ENEMA | RECTAL | Status: DC | PRN
  Filled 2021-09-01: qty 1

## 2021-09-01 MED ORDER — SODIUM CHLORIDE 0.9% FLUSH
3.0000 mL | Freq: Two times a day (BID) | INTRAVENOUS | Status: DC
  Administered 2021-09-01 – 2021-09-04 (×8): 3 mL via INTRAVENOUS

## 2021-09-01 MED ORDER — ALTEPLASE 2 MG IJ SOLR: 2.0000 mg | Freq: Once | INTRAMUSCULAR | Status: DC | PRN

## 2021-09-01 MED ORDER — APIXABAN 2.5 MG PO TABS: 2.5000 mg | ORAL_TABLET | Freq: Two times a day (BID) | ORAL | Status: DC

## 2021-09-01 MED ORDER — CAMPHOR-MENTHOL 0.5-0.5 % EX LOTN
1.0000 "application " | TOPICAL_LOTION | Freq: Three times a day (TID) | CUTANEOUS | Status: DC | PRN
  Filled 2021-09-01: qty 222

## 2021-09-01 MED ORDER — SODIUM CHLORIDE 0.9 % IV SOLN
100.0000 mL | INTRAVENOUS | Status: DC | PRN
Start: 2021-09-01 — End: 2021-09-02

## 2021-09-01 MED ORDER — HYDROXYZINE HCL 25 MG PO TABS: 25.0000 mg | ORAL_TABLET | Freq: Three times a day (TID) | ORAL | Status: DC | PRN

## 2021-09-01 MED ORDER — SODIUM CHLORIDE 0.9 % IV SOLN: 100.0000 mL | INTRAVENOUS | Status: DC | PRN

## 2021-09-01 MED ORDER — SEVELAMER CARBONATE 800 MG PO TABS
1600.0000 mg | ORAL_TABLET | Freq: Three times a day (TID) | ORAL | Status: DC
  Filled 2021-09-01: qty 2

## 2021-09-01 MED ORDER — CHLORHEXIDINE GLUCONATE CLOTH 2 % EX PADS
6.0000 | MEDICATED_PAD | Freq: Every day | CUTANEOUS | Status: DC
  Administered 2021-09-02 – 2021-09-05 (×4): 6 via TOPICAL

## 2021-09-01 MED ORDER — ACETAMINOPHEN 325 MG PO TABS: 650.0000 mg | ORAL_TABLET | Freq: Four times a day (QID) | ORAL | Status: DC | PRN

## 2021-09-01 MED ORDER — SEVELAMER CARBONATE 800 MG PO TABS: 1600.0000 mg | ORAL_TABLET | Freq: Three times a day (TID) | ORAL | Status: DC

## 2021-09-01 MED ORDER — LIDOCAINE-PRILOCAINE 2.5-2.5 % EX CREA
1.0000 "application " | TOPICAL_CREAM | CUTANEOUS | Status: DC | PRN
  Filled 2021-09-01: qty 5

## 2021-09-01 MED ORDER — HYDROXYZINE HCL 25 MG PO TABS
25.0000 mg | ORAL_TABLET | Freq: Three times a day (TID) | ORAL | Status: DC | PRN
  Administered 2021-09-04: 25 mg
  Filled 2021-09-01: qty 1

## 2021-09-01 MED ORDER — HEPARIN SODIUM (PORCINE) 1000 UNIT/ML DIALYSIS: 1000.0000 [IU] | INTRAMUSCULAR | Status: DC | PRN

## 2021-09-01 MED ORDER — ONDANSETRON HCL 4 MG/2ML IJ SOLN: 4.0000 mg | Freq: Four times a day (QID) | INTRAMUSCULAR | Status: DC | PRN

## 2021-09-01 MED ORDER — CEFTRIAXONE IV (FOR PTA / DISCHARGE USE ONLY): 2.0000 g | Freq: Two times a day (BID) | INTRAVENOUS | Status: DC

## 2021-09-01 MED ORDER — VANCOMYCIN HCL IN DEXTROSE 1-5 GM/200ML-% IV SOLN
1000.0000 mg | Freq: Once | INTRAVENOUS | Status: AC
  Administered 2021-09-01: 1000 mg via INTRAVENOUS
  Filled 2021-09-01: qty 200

## 2021-09-01 MED ORDER — LACTATED RINGERS IV SOLN: INTRAVENOUS | Status: DC

## 2021-09-01 MED ORDER — HYDRALAZINE HCL 20 MG/ML IJ SOLN: 5.0000 mg | INTRAMUSCULAR | Status: DC | PRN

## 2021-09-01 MED ORDER — ALBUTEROL SULFATE (2.5 MG/3ML) 0.083% IN NEBU: 2.5000 mg | INHALATION_SOLUTION | RESPIRATORY_TRACT | Status: DC | PRN

## 2021-09-01 MED ORDER — SODIUM CHLORIDE 0.9 % IV SOLN
2.0000 g | Freq: Two times a day (BID) | INTRAVENOUS | Status: DC
  Administered 2021-09-01: 2 g via INTRAVENOUS
  Filled 2021-09-01: qty 2000

## 2021-09-01 MED ORDER — LIDOCAINE HCL (PF) 1 % IJ SOLN: 5.0000 mL | INTRAMUSCULAR | Status: DC | PRN

## 2021-09-01 MED ORDER — LEVETIRACETAM IN NACL 500 MG/100ML IV SOLN
500.0000 mg | Freq: Two times a day (BID) | INTRAVENOUS | Status: DC
  Administered 2021-09-02 – 2021-09-05 (×6): 500 mg via INTRAVENOUS
  Filled 2021-09-01 (×6): qty 100

## 2021-09-01 MED ORDER — PENTAFLUOROPROP-TETRAFLUOROETH EX AERO
1.0000 | INHALATION_SPRAY | CUTANEOUS | Status: DC | PRN
Start: 2021-09-01 — End: 2021-09-02

## 2021-09-01 NOTE — Progress Notes (Signed)
Arrived in room for "PICC" line removal.  Noted what was thought to be a CVC.  Removed stitches per protocol. When pulling there was slight resistance, then cuff appeared, then more resistance noted.  Stopped immediately and placed tegaderm over site.  Called IR for guidance and notified unit nurse.  Nurse notified MD, and was told to cover line for now, patient may be returning to comfort care status in the morning and did not want further invasive interventions.    Notified IR of MD request.  Advised unit RN not to use line for any purpose.  Dead end cap placed and DO NOT TOUCH sticker applied.

## 2021-09-01 NOTE — Progress Notes (Signed)
Pt was attached with MRI compatible EEG leads.

## 2021-09-01 NOTE — Consult Note (Signed)
Neurology Consultation  Reason for Consult: Seizures Referring Physician: Dr. Lorin Mercy  CC: Seizures, encephalopathy-transfer outside hospital for higher level of care and continuous EEG monitoring  History is obtained from: Chart review  HPI: [name] [name] is a 60 y.o. male-unable to provide history-history obtained from chart review HPI from initial consult note on 08/27/2021 by Dr. Quinn Axe pasted below: Patient unable to provide history 2/2 severe encephalopathy. Per progress note from Dr. Roderic Palau, "60 y.o. male with medical history significant of ESRD-HD, hypertension, tobacco abuse, polysubstance abuse, diverticulitis, anemia, jugular vein thrombosis, GI bleeding, right pneumothorax presented with worsening lower back pain.  He was found to have sepsis secondary to Enterococcus faecalis bacteremia.  ID was consulted and he has been on ampicillin and ceftriaxone.  He was found to have possible developing osteomyelitis in C5-C6 with possible early left psoas abscess formation and possible early discitis in L4-L5.  Nephrology was also consulted for continuation of dialysis.  TEE showed aortic valve endocarditis.  PT recommended SNF placement.  He is currently medically stable for discharge.  He was supposed to be discharged to SNF on 08/21/2021 but discharge was held because he could not sit up for dialysis."  Subsequently he became increasingly encephalopathic. MRI brain was performed which showed probable punctate acute infarcts in the posterior left temporal lobe and right insular cortex (personal review).  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  Neurological consultation for punctate infarctions on MRI  along with encephalopathy.  Neurology at [facility] has been following the patient during his stay there-had been showing waxing and waning mentation.  The mentation improved some with fosphenytoin but mental status worsened again.  Multiple spot EEG was done with concern for cortical dysfunction from the right hemisphere along with global cerebral dysfunction.  Intermittent right focal slowing remained present on multiple routine EEGs.  Clinical event on Tuesday, 08/29/2021 concerning for seizures but the spot EEGs have not captured a seizure.  Due to waxing and waning mental exam as well as multiple other comorbidities and family's desire to pursue full scope of care, he was transferred to [facility] for LTM EEG monitoring.   He is unable to provide any history at this time   See stroke work-up completed in the prior notes from [facility]   ROS: Unable to obtain due to altered mental status.   Past Medical History:  Diagnosis Date   Anemia of chronic disease    BL Hgb ~10   CHF (congestive heart failure) ([place])    pt denies 04/01/14   Diverticulitis    End-stage renal disease on hemodialysis (HCC)    HD m/w/f - right arm graft   History of blood transfusion    Hypertension    Pneumonia    hx of   Pneumonia    Polysubstance abuse ([place])    Renal insufficiency    Secondary hyperparathyroidism ([place])    Sleep disturbance    Tobacco abuse         Family History  Problem Relation Age of Onset   Thyroid disease Mother    Renal Disease Maternal Grandmother    Diabetes Maternal Grandmother    Hypertension Maternal Aunt    Hypertension Maternal Uncle  Social History:   reports that he has been smoking cigarettes. He has a 120.00 pack-year smoking history. He uses smokeless tobacco. He reports current alcohol use of about 3.0 standard drinks per week. He reports current drug use. Drugs: Marijuana and Cocaine.  Medications  Current  Facility-Administered Medications:    acetaminophen (TYLENOL) tablet 650 mg, 650 mg, Per Tube, Q6H PRN **OR** acetaminophen (TYLENOL) suppository 650 mg, 650 mg, Rectal, Q6H PRN, Bertis Ruddy, RPH   albuterol (PROVENTIL) (2.5 MG/3ML) 0.083% nebulizer solution 2.5 mg, 2.5 mg, Nebulization, Q2H PRN, Karmen Bongo, MD   ampicillin (OMNIPEN) 2 g in sodium chloride 0.9 % 100 mL IVPB, 2 g, Intravenous, Q12H, Karmen Bongo, MD, Last Rate: 300 mL/hr at 08/30/2021 1240, 2 g at 08/14/2021 1240   apixaban (ELIQUIS) tablet 2.5 mg, 2.5 mg, Per Tube, BID, Karmen Bongo, MD, 2.5 mg at 08/29/2021 1241   camphor-menthol (SARNA) lotion 1 application, 1 application, Topical, I0X PRN **AND** hydrOXYzine (ATARAX/VISTARIL) tablet 25 mg, 25 mg, Per Tube, Q8H PRN, Bertis Ruddy, RPH   cefTRIAXone (ROCEPHIN) 2 g in sodium chloride 0.9 % 100 mL IVPB, 2 g, Intravenous, Q12H, Karmen Bongo, MD   docusate sodium (ENEMEEZ) enema 283 mg, 1 enema, Rectal, PRN, Karmen Bongo, MD   hydrALAZINE (APRESOLINE) injection 5 mg, 5 mg, Intravenous, Q4H PRN, Karmen Bongo, MD   lactated ringers infusion, , Intravenous, Continuous, Karmen Bongo, MD, Last Rate: 75 mL/hr at 08/18/2021 1255, New Bag at 08/21/2021 1255   ondansetron (ZOFRAN) tablet 4 mg, 4 mg, Per Tube, Q6H PRN **OR** ondansetron (ZOFRAN) injection 4 mg, 4 mg, Intravenous, Q6H PRN, Bertis Ruddy, RPH   sevelamer carbonate (RENVELA) tablet 1,600 mg, 1,600 mg, Per Tube, TID WC, Karmen Bongo, MD   sodium chloride flush (NS) 0.9 % injection 3 mL, 3 mL, Intravenous, Q12H, Karmen Bongo, MD   sorbitol 70 % solution 30 mL, 30 mL, Per Tube, PRN, Bertis Ruddy, RPH   Exam: Current vital signs: BP (!) 153/43 (BP Location: Left Leg)   Pulse 91   Temp 98.4 F (36.9 C) (Axillary)   Resp 18   SpO2 100%  Vital signs in last 24 hours: Temp:  [98 F (36.7 C)-98.7 F (37.1 C)] 98.4 F (36.9 C) (09/23 1551) Pulse Rate:  [78-91] 91 (09/23 1551) Resp:  [13-26] 18  (09/23 1551) BP: (90-172)/(27-54) 153/43 (09/23 1551) SpO2:  [97 %-100 %] 100 % (09/23 1551) General: Drowsy, in no distress HEENT: Normocephalic/atraumatic CVS: Regular rate rhythm Respiration: Unlabored breathing Extremities: Trace edema Neurological exam Extremely drowsy Opens eyes to noxious stimulation Moans Does not verbalize Does not follow commands Cranials: Pupils are equal round react light, extraocular movements difficult to assess given his mentation, has some roving eye movements, does not blink to threat from either side, face appears symmetric. Motor examination: Localizes with both upper extremities.  Minimal withdrawal in bilateral lower extremities. Sensation: As above  Labs I have reviewed labs in epic and the results pertinent to this consultation are:   CBC    Component Value Date/Time   WBC 9.4 08/30/2021 0420   RBC 3.05 (L) 08/30/2021 0420   HGB 9.5 (L) 08/30/2021 0420   HGB 9.3 (L) 06/22/2013 1116   HCT 30.8 (L) 08/30/2021 0420   HCT 29.6 (L) 06/22/2013 1116   PLT 117 (L) 08/30/2021 0420   PLT 195 06/22/2013 1116   MCV 101.0 (H) 08/30/2021 0420   MCV 89 06/22/2013 1116   MCH 31.1 08/30/2021 0420   MCHC 30.8 08/30/2021 0420  RDW 23.8 (H) 08/30/2021 0420   RDW 19.3 (H) 06/22/2013 1116   LYMPHSABS 0.6 (L) 08/23/2021 0606   LYMPHSABS 1.2 12/11/2012 0153   MONOABS 1.4 (H) 08/23/2021 0606   MONOABS 0.7 12/11/2012 0153   EOSABS 0.0 08/23/2021 0606   EOSABS 0.2 12/11/2012 0153   BASOSABS 0.0 08/23/2021 0606   BASOSABS 0.0 12/11/2012 0153    CMP     Component Value Date/Time   NA 139 08/30/2021 0420   NA 139 06/22/2013 1116   K 4.6 08/30/2021 0420   K 3.8 06/22/2013 1116   CL 97 (L) 08/30/2021 0420   CL 101 06/22/2013 1116   CO2 27 08/30/2021 0420   CO2 35 (H) 06/22/2013 1116   GLUCOSE 79 08/30/2021 0420   GLUCOSE 96 06/22/2013 1116   BUN 56 (H) 08/30/2021 0420   BUN 30 (H) 06/22/2013 1116   CREATININE 7.61 (H) 08/30/2021 0420    CREATININE 7.57 (H) 06/22/2013 1116   CALCIUM 9.3 08/30/2021 0420   CALCIUM 7.6 (L) 06/22/2013 1116   PROT 7.2 08/26/2021 0525   PROT 8.6 (H) 06/22/2013 1116   ALBUMIN 2.7 (L) 08/28/2021 0437   ALBUMIN 2.7 (L) 06/22/2013 1116   AST 30 08/26/2021 0525   AST 18 06/22/2013 1116   ALT 19 08/26/2021 0525   ALT 13 06/22/2013 1116   ALKPHOS 72 08/26/2021 0525   ALKPHOS 114 06/22/2013 1116   BILITOT 0.8 08/26/2021 0525   BILITOT 0.4 06/22/2013 1116   GFRNONAA 8 (L) 08/30/2021 0420   GFRNONAA 8 (L) 06/22/2013 1116   GFRAA 3 (L) 12/05/2019 0759   GFRAA 9 (L) 06/22/2013 1116   TEE on 08/16/2021 with aortic valve endocarditis  Imaging I have reviewed the images obtained: MRI brain from 08/26/2021 with punctate bihemispheric infarctions acute to subacute.  Assessment:  60 year old man, past history of ESRD on HD, hypertension, tobacco abuse, polysubstance abuse, diverticulitis, anemia, jugular vein thrombosis on anticoagulation which was held for GI bleeding and then resumed with Eliquis, right pneumothorax, presented with worsening back pain, found to have sepsis due to Enterococcus faecalis bacteremia noted to have developing osteomyelitis in cervical and lumbar regions of the spine, with worsening mentation, got imaging which revealed a punctate bihemispheric acute to subacute infarctions.   TEE from 08/16/2021 concerning for vegetations-infective endocarditis of the aortic valve.  From a stroke standpoint-need head imaging to rule out mycotic aneurysms From a seizure perspective-see detailed recommendations below.  Recommendations: Stat spot EEG followed by LTM EEG Keppra 1 g IV load followed by 500 twice daily.  I am not sure Vimpat monotherapy would be the best way to go for him.  He was switched to Vimpat from phenytoin due to interaction of phenytoin with his Eliquis. I would do Keppra only for now followed by addition of Depakote followed by addition of Vimpat if needed. Maintain seizure  precautions  From a stroke standpoint, I need to get head vessel imaging to rule out mycotic aneurysm since he is already on anticoagulation for his jugular vein thrombosis and should he have mycotic aneurysms, he would be at extremely high risk for bleeding.  Thankfully he has been on IV antibiotics which is the optimal treatment.   Endocarditis, osteomyelitis-appreciate management per primary team and ID.  Preliminary EEG-diffuse slowing, no focal abnormalities.  Given that his exam is very poor even with no ongoing seizure activity suggest that there is probably a large contribution from all other comorbidities and not just seizures.  Given his baseline poor health  as well as the current comorbidities, I am not sure if optimal treatment of seizures is going to make him return to his baseline. I had a detailed discussion with the palliative medicine team as well as my colleagues over at [facility] and admitting hospitalist Dr. Lorin Mercy.  I will follow-up with you tomorrow.  I will also attempt to speak with the family tomorrow once I have some more information from the overnight EEG  -- Amie Portland, MD Neurologist [facility] Pager: [phone]

## 2021-09-01 NOTE — Progress Notes (Signed)
LTM EEG hooked up and running - no initial skin breakdown - push button tested - neuro notified. [facility] monitoring.  

## 2021-09-01 NOTE — Progress Notes (Signed)
Order was placed for PICC removal from pt chest. When IV team arrived to remove line, they noted it was a tunneled CVC, and had been charted incorrectly as a PICC.  IR was called for removal of line.  Provider was paged of line status and responded to RN to cover the line for now, and plan to limit invasive measures at this time. Care team plans to discuss pt's goals of care with family tomorrow.  IV team will cover line and place sticker to not use line at this time.

## 2021-09-01 NOTE — Consult Note (Addendum)
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   Given seizure and potential increased risk with double beta lactam, and likely beyond 6 weeks abx therapy needed, would be best in his case to do vancomycin (and gent) with hd     Plan: Stop ceftriaxone/amp Start vancomycin tiw with hemodialysis Plan 8 weeks from 9/03-10/29 Repeat mri spine in 2 weeks (first week of October) Can remove picc if no other critical need, once vancomycin used Qweek crp, vanc trough, cbc, cmp here   Discussed with primary team  ---------- Addendum If comfort care desired, can stop abx If full medical care  -will transition to vanc/gent with dialysis to finish course of 6 weeks endocarditis tx, then vanc alone for ongoing tx of vertebral OM    I spent 60 minute reviewing data/chart, and coordinating care and >50% direct face to face time providing counseling/discussing diagnostics/treatment plan with patient       ------------------------------------------------ Principal Problem:   Endocarditis Active Problems:   End-stage renal disease on hemodialysis ([place])   Polysubstance abuse ([place])   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 (HCC)   Chronic combined systolic and diastolic CHF (congestive heart failure) ([place])   Goals of care, counseling/discussion   DNR (do not  resuscitate)    HPI: [name] [name] is a 60 y.o. male esrd, hx mssa bacteremia tx in 2014 (no endocarditis), IJ vein thrombosis, admitted to [facility] 8/30 with lower back pain found to have e faecalis endocarditis, started on appropriate abx, course complicated by seizure, initially transitioned to hospice, but since revered to full medical care (dnr/dni), transferred to [facility] for further management in setting ongoing seizure  Previous id w/u with problems: L4-5 discitis/psoas abscess-phlegmon C5-c5 marrow edema concerning for evolving OM AV endocarditis  Patient was last seen by Dr Delaine Lame at [facility] on 9/09. The plan was to do 6 weeks abx until 10/15 with oral tail  9/03 bcx negative 9/02 bcx e faecalis 9/07 tee positive for AV endocarditis  His course was complicated by protracted seizure. Family initially transitioned to comfort care.  He still has been on abx until the day of transfer.   Subsequently he was thought to have some neurologic improvement so transferred to [facility] for ongoing neurologic monitoring. In the mean time family wanted to resume full medical care (still dnr/dni)  Patient's mentation is still very poor not verbal or at all interactive. Hx via chart and discussing with primary team   Family History  Problem Relation Age of Onset   Thyroid disease Mother    Renal Disease Maternal Grandmother    Diabetes Maternal Grandmother    Hypertension Maternal Aunt    Hypertension Maternal Uncle     Social History   Tobacco Use   Smoking status: Every Day    Packs/day: 6.00    Years:  20.00    Pack years: 120.00    Types: Cigarettes   Smokeless tobacco: Current  Vaping Use   Vaping Use: Never used  Substance Use Topics   Alcohol use: Yes    Alcohol/week: 3.0 standard drinks    Types: 3 Cans of beer per week    Comment: occasional   Drug use: Yes    Types: Marijuana, Cocaine    Comment: remote cocaine abuse, ongoing marijuana use.    Allergies  Allergen  Reactions   Other     Other reaction(s): Other (See Comments) blister   Tape Other (See Comments)    Plastic Tape Causes blister    Review of Systems: ROS All Other ROS was negative, except mentioned above   Past Medical History:  Diagnosis Date   Anemia of chronic disease    BL Hgb ~10   CHF (congestive heart failure) ([place])    pt denies 04/01/14   Diverticulitis    End-stage renal disease on hemodialysis (HCC)    HD m/w/f - right arm graft   History of blood transfusion    Hypertension    Pneumonia    hx of   Pneumonia    Polysubstance abuse (HCC)    Renal insufficiency    Secondary hyperparathyroidism ([place])    Sleep disturbance    Tobacco abuse        Scheduled Meds:  apixaban  2.5 mg Per Tube BID   sevelamer carbonate  1,600 mg Per Tube TID WC   sodium chloride flush  3 mL Intravenous Q12H   Continuous Infusions:  ampicillin (OMNIPEN) IV 2 g (08/24/2021 1240)   cefTRIAXone (ROCEPHIN)  IV     lactated ringers 75 mL/hr at 08/23/2021 1459   levETIRAcetam 1,000 mg (09/06/2021 1452)   [START ON 09/02/2021] levETIRAcetam     PRN Meds:.acetaminophen **OR** acetaminophen, albuterol, camphor-menthol **AND** hydrOXYzine, docusate sodium, hydrALAZINE, ondansetron **OR** ondansetron (ZOFRAN) IV, sorbitol   OBJECTIVE: Blood pressure (!) 155/27, pulse 88, temperature 98.7 F (37.1 C), temperature source Axillary, resp. rate 16, SpO2 98 %.  Physical Exam General/constitutional: comatose, chronically ill appearing HEENT: Normocephalic, PER, Conj Clear, EOMI, Oropharynx clear Neck supple CV: rrr no mrg Lungs: clear to auscultation, normal respiratory effort Abd: Soft, Nontender Ext: no edema Skin: No Rash Neuro: deferred as comatose/altered/not able to interact MSK: no peripheral joint swelling/tenderness/warmth; back spines nontender   Central line presence: yes; left chest picc site no erythema/purulence    Lab Results Lab Results  Component Value Date   WBC  9.4 08/30/2021   HGB 9.5 (L) 08/30/2021   HCT 30.8 (L) 08/30/2021   MCV 101.0 (H) 08/30/2021   PLT 117 (L) 08/30/2021    Lab Results  Component Value Date   CREATININE 7.61 (H) 08/30/2021   BUN 56 (H) 08/30/2021   NA 139 08/30/2021   K 4.6 08/30/2021   CL 97 (L) 08/30/2021   CO2 27 08/30/2021    Lab Results  Component Value Date   ALT 19 08/26/2021   AST 30 08/26/2021   ALKPHOS 72 08/26/2021   BILITOT 0.8 08/26/2021      Microbiology: Recent Results (from the past 240 hour(s))  MRSA Next Gen by PCR, Nasal     Status: None   Collection Time: 08/30/21 12:00 PM   Specimen: Nasal Mucosa; Nasal Swab  Result Value Ref Range Status   MRSA by PCR Next Gen NOT DETECTED NOT DETECTED Final    Comment: (NOTE) The GeneXpert MRSA Assay (FDA approved  for NASAL specimens only), is one component of a comprehensive MRSA colonization surveillance program. It is not intended to diagnose MRSA infection nor to guide or monitor treatment for MRSA infections. Test performance is not FDA approved in patients less than 25 years old. Performed at [facility], [address], [place], [place] [address]      Serology:    Imaging: If present, new imagings (plain films, ct scans, and mri) have been personally visualized and interpreted; radiology reports have been reviewed. Decision making incorporated into the Impression / Recommendations.  9/04 cervical, lumbar, thoracic spine mri 1. Interval development of marrow edema about the C5-6 interspace since recent exam, concerning for possible developing osteomyelitis. Adjacent prevertebral/retropharyngeal edema has slightly increased from prior. 2. Abnormal inflammatory changes involving the left psoas muscle, concerning for acute infection with myositis. Superimposed 1.3 cm area of T2 signal intensity suspicious for phlegmon/early abscess formation. Fluid signal intensity within the L4-5 interspace is nonspecific, but could  reflect changes of early discitis, and possibly be the source of the left psoas infection. 3. Moderate facet degeneration at L4-5 with associated reactive marrow edema and enhancement. Findings are favored to be degenerative in nature, although attention at follow-up is recommended. 4. Irregular and partially loculated left pleural effusion. 5. Underlying diffusely abnormal marrow edema, stable from prior.  9/07 tee  1. Left ventricular ejection fraction, by estimation, is 55 to 60%. The  left ventricle has normal function. The left ventricular internal cavity  size was mildly dilated.   2. Right ventricular systolic function is mildly reduced. The right  ventricular size is mildly enlarged.   3. Left atrial size was moderately dilated. No left atrial/left atrial  appendage thrombus was detected.   4. Right atrial size was mild to moderately dilated.   5. 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   6. 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   7. 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 The aortic  valve is abnormal. Aortic valve regurgitation is severe. Mild to moderate  aortic valve sclerosis/calcification is present, without any evidence of  aortic stenosis.   9/17 mri brain 1. Probable punctate acute infarcts in the posterior left temporal lobe and right insular cortex. Differential includes T2 shine through artifact. 2. Mild chronic white matter microangiopathy.  9/20 ct head No acute intracranial pathology   Jabier Mutton, [facility] [phone] pager    08/17/2021, 3:01 PM

## 2021-09-01 NOTE — Care Plan (Signed)
Patient was transferred to [facility] for long-term EEG.   Patient was transferred before I was able to see the patient.

## 2021-09-01 NOTE — Progress Notes (Signed)
Patient departed with [facility]; DNR signed.

## 2021-09-01 NOTE — Consult Note (Addendum)
[facility] Renal Consultation Note    Indication for Consultation:  Management of ESRD/hemodialysis; anemia, hypertension/volume and secondary hyperparathyroidism  PCP:Pcp, No  HPI: [name] [name] is a 60 y.o. male with ESRD on HD MWF at [place] (managed by Dr. Juleen China). His past medical history is significant for hypertension, tobacco abuse, polysubstance abuse, diverticulitis, anemia, jugular vein thrombosis, GI bleeding, and right pneumothorax.  Review previous records in [place]. Patient was admitted to [facility] on 8/30 c/o worsening back pain. He was found to have Enterococcus faecalis bacteremia. ID was consulted on placed on ABXs. He was then found to have possible developing osteomyelitis in C5-C6 with possible early left psoas abscess formation and possible discitis in L4-L5. 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 Plan for patient to be discharged to SNF in [facility] and resume HD at [facility] but discharged was held d/t patient not tolerating being in the chair for HD. Unfortunately, patient's neuro status has worsened during hospitalization. Neuro was consulted-MRI brain showed probable punctate acute infarcts in the posterior left temporal lobe and right insular cortex. Of note, patient had possible seizure episode after HD on 9/21. Palliative care on board and patient currently a DNR. Patient now transferred here to [facility] today for further neurologic evaluation and long-term EEG. Plan for HD 9/24.  Past Medical History:  Diagnosis Date   Anemia of chronic disease    BL Hgb ~10   CHF (congestive heart failure) ([place])    pt denies 04/01/14   Diverticulitis    End-stage renal disease on hemodialysis (HCC)    HD m/w/f - right arm graft   History of blood transfusion    Hypertension    Pneumonia    hx of   Pneumonia    Polysubstance abuse ([place])    Renal insufficiency    Secondary hyperparathyroidism ([place])     Sleep disturbance    Tobacco abuse    Past Surgical History:  Procedure Laterality Date   APPENDECTOMY     CHEST TUBE INSERTION  02/2013   COLONOSCOPY  12/16/2012   Procedure: COLONOSCOPY;  Surgeon: Milus Banister, MD;  Location: [place];  Service: Endoscopy;  Laterality: N/A;   IR FLUORO GUIDE CV LINE LEFT  08/18/2021   IR FLUORO GUIDE CV LINE LEFT  08/29/2021   IR US GUIDANCE  08/18/2021   PLEURAL EFFUSION DRAINAGE  12/18/2012   Procedure: DRAINAGE OF PLEURAL EFFUSION;  Surgeon: Melrose Nakayama, MD;  Location: [place];  Service: Thoracic;  Laterality: Right;   surgery for horseshoe kidney     TALC PLEURODESIS Right 02/05/2013   Procedure: Pietro Cassis;  Surgeon: Melrose Nakayama, MD;  Location: [place];  Service: Thoracic;  Laterality: Right;   TEE WITHOUT CARDIOVERSION N/A 01/16/2013   Procedure: TRANSESOPHAGEAL ECHOCARDIOGRAM (TEE);  Surgeon: Sanda Klein, MD;  Location[facility];  Service: Cardiovascular;  Laterality: N/A;   TEE WITHOUT CARDIOVERSION Right 08/16/2021   Procedure: TRANSESOPHAGEAL ECHOCARDIOGRAM (TEE);  Surgeon: Dionisio David, MD;  Location: [facility] ORS;  Service: Cardiovascular;  Laterality: Right;   VIDEO ASSISTED THORACOSCOPY  12/18/2012   Procedure: VIDEO ASSISTED THORACOSCOPY;  Surgeon: Melrose Nakayama, MD;  Location: [place];  Service: Thoracic;  Laterality: Right;  pleural peel   VIDEO ASSISTED THORACOSCOPY (VATS)/THOROCOTOMY Right 02/19/2013   Procedure: VIDEO ASSISTED THORACOSCOPY (VATS)/THOROCOTOMY;  Surgeon: Melrose Nakayama, MD;  Location: [place];  Service: Thoracic;  Laterality: Right;  Right Video Assisted Thoracoscopy,  Right Thoracotomy, Decortication,    VIDEO BRONCHOSCOPY WITH INSERTION OF INTERBRONCHIAL VALVE (IBV)  01/09/2013   Procedure: VIDEO BRONCHOSCOPY WITH INSERTION OF INTERBRONCHIAL VALVE (IBV);  Surgeon: Melrose Nakayama, MD;  Location: [place];  Service: Thoracic;  Laterality: N/A;  placement of 2 Intrabronchial valves in right lower  lobe   VIDEO BRONCHOSCOPY WITH INSERTION OF INTERBRONCHIAL VALVE (IBV) Right 02/19/2013   Procedure: VIDEO BRONCHOSCOPY WITH INSERTION OF INTERBRONCHIAL VALVE (IBV);  Surgeon: Melrose Nakayama, MD;  Location: [place];  Service: Thoracic;  Laterality: Right;  removal of two interbronchial valves right chest   Family History  Problem Relation Age of Onset   Thyroid disease Mother    Renal Disease Maternal Grandmother    Diabetes Maternal Grandmother    Hypertension Maternal Aunt    Hypertension Maternal Uncle    Social History:  reports that he has been smoking cigarettes. He has a 120.00 pack-year smoking history. He uses smokeless tobacco. He reports current alcohol use of about 3.0 standard drinks per week. He reports current drug use. Drugs: Marijuana and Cocaine. Allergies  Allergen Reactions   Other     Other reaction(s): Other (See Comments) blister   Tape Other (See Comments)    Plastic Tape Causes blister   Prior to Admission medications   Medication Sig Start Date End Date Taking? Authorizing Provider  ampicillin IVPB Inject 2 g into the vein every 12 (twelve) hours. Indication:  Enterococcus faecalis Bacteremia, lumbar discitis and endocarditis Endocarditis First Dose: Yes Last Day of Therapy:  09/23/21 Labs - Once weekly:CBC with differential and CMP  Line will have to be removed at the end of antibiotic treatment Fax weekly labs to  Dr.Ravishankar [phone] Method of administration may be changed at the discretion of home infusion pharmacist based upon assessment of the patient and/or caregiver's ability to self-administer the medication ordered. 08/21/21 09/27/21  Aline August, MD  apixaban (ELIQUIS) 2.5 MG TABS tablet Take 1 tablet (2.5 mg total) by mouth 2 (two) times daily. 08/21/21   Aline August, MD  cefTRIAXone (ROCEPHIN) IVPB Inject 2 g into the vein every 12 (twelve) hours. Indication:   Enterococcus faecalis Bacteremia, lumbar discitis and endocarditis  Endocarditis First Dose: Yes Last Day of Therapy:  09/23/21 Labs - Once weekly:CBC with differential and CMP  Line will have to be removed at the end of antibiotic treatment Fax weekly labs to  [place] [phone] Method of administration: IV Push Method of administration may be changed at the discretion of home infusion pharmacist based upon assessment of the patient and/or caregiver's ability to self-administer the medication ordered. 08/21/21 09/27/21  Aline August, MD  lidocaine (LIDODERM) 5 % Place 2 patches onto the skin daily. Remove & Discard patch within 12 hours or as directed by MD 08/21/21   Aline August, MD  methocarbamol (ROBAXIN) 500 MG tablet Take 1 tablet (500 mg total) by mouth every 6 (six) hours as needed for muscle spasms. 08/21/21   Aline August, MD  oxyCODONE-acetaminophen (PERCOCET/ROXICET) 5-325 MG tablet Take 1 tablet by mouth every 6 (six) hours as needed for moderate pain. 08/21/21   Aline August, MD  polyethylene glycol (MIRALAX / GLYCOLAX) 17 g packet Take 17 g by mouth daily as needed for moderate constipation. 08/21/21   Aline August, MD  senna-docusate (SENOKOT-S) 8.6-50 MG tablet Take 1 tablet by mouth 2 (two) times daily. 08/21/21   Aline August, MD  sevelamer carbonate (RENVELA) 800 MG tablet Take 2 tablets (1,600 mg total) by  mouth 3 (three) times daily with meals. 08/21/21   Aline August, MD   Current Facility-Administered Medications  Medication Dose Route Frequency Provider Last Rate Last Admin   acetaminophen (TYLENOL) tablet 650 mg  650 mg Per Tube Q6H PRN Bertis Ruddy, RPH       Or   acetaminophen (TYLENOL) suppository 650 mg  650 mg Rectal Q6H PRN Bertis Ruddy, RPH       albuterol (PROVENTIL) (2.5 MG/3ML) 0.083% nebulizer solution 2.5 mg  2.5 mg Nebulization Q2H PRN Karmen Bongo, MD       apixaban Arne Cleveland) tablet 2.5 mg  2.5 mg Per Tube BID Karmen Bongo, MD   2.5 mg at 09/02/2021 1241   hydrOXYzine (ATARAX/VISTARIL) tablet  25 mg  25 mg Per Tube Q8H PRN Bertis Ruddy, RPH       And   camphor-menthol [facility]) lotion 1 application  1 application Topical P3X PRN Bertis Ruddy, RPH       docusate sodium (ENEMEEZ) enema 283 mg  1 enema Rectal PRN Karmen Bongo, MD       hydrALAZINE (APRESOLINE) injection 5 mg  5 mg Intravenous Q4H PRN Karmen Bongo, MD       lactated ringers infusion   Intravenous Continuous Karmen Bongo, MD 75 mL/hr at 08/22/2021 1459 New Bag at 08/24/2021 1459   [START ON 09/02/2021] levETIRAcetam (KEPPRA) IVPB 500 mg/100 mL premix  500 mg Intravenous Q12H Amie Portland, MD       ondansetron [facility]) tablet 4 mg  4 mg Per Tube Q6H PRN Bertis Ruddy, RPH       Or   ondansetron (ZOFRAN) injection 4 mg  4 mg Intravenous Q6H PRN Bertis Ruddy, RPH       sevelamer carbonate (RENVELA) tablet 1,600 mg  1,600 mg Per Tube TID WC Karmen Bongo, MD       sodium chloride flush (NS) 0.9 % injection 3 mL  3 mL Intravenous Q12H Karmen Bongo, MD   3 mL at 08/18/2021 1500   sorbitol 70 % solution 30 mL  30 mL Per Tube PRN Bertis Ruddy, [facility]       Labs: Basic Metabolic Panel: Recent Labs  Lab 08/26/21 0525 08/28/21 0437 08/30/21 0420  NA 138 138 139  K 3.8 4.6 4.6  CL 97* 97* 97*  CO2 29 23 27   GLUCOSE 120* 85 79  BUN 50* 71* 56*  CREATININE 6.84* 9.96* 7.61*  CALCIUM 9.7 10.2 9.3  PHOS  --  7.8*  --    Liver Function Tests: Recent Labs  Lab 08/26/21 0525 08/28/21 0437  AST 30  --   ALT 19  --   ALKPHOS 72  --   BILITOT 0.8  --   PROT 7.2  --   ALBUMIN 2.5* 2.7*   No results for input(s): LIPASE, AMYLASE in the last 168 hours. No results for input(s): AMMONIA in the last 168 hours. CBC: Recent Labs  Lab 08/26/21 0525 08/28/21 0437 08/30/21 0420  WBC 9.7 10.6* 9.4  HGB 9.4* 9.8* 9.5*  HCT 30.1* 31.1* 30.8*  MCV 99.0 100.0 101.0*  PLT 190 137* 117*   Cardiac Enzymes: No results for input(s): CKTOTAL, CKMB, CKMBINDEX, TROPONINI in the last 168 hours. CBG: Recent Labs   Lab 08/31/21 2315 08/15/2021 0404 08/27/2021 0748 08/28/2021 1135 09/08/2021 1549  GLUCAP 98 89 105* 116* 105*   Iron Studies: No results for input(s): IRON, TIBC, TRANSFERRIN, FERRITIN in the last 72 hours. Studies/Results: DG Abd 1 View  Result Date: 08/31/2021  CLINICAL DATA:  Enteric tube placement. EXAM: ABDOMEN - 1 VIEW COMPARISON:  CT abdomen pelvis dated November 13, 2019. FINDINGS: Enteric tube in the stomach. Partially visualized HERO graft. The bowel gas pattern is normal. No radio-opaque calculi or other significant radiographic abnormality are seen. IMPRESSION: 1. Enteric tube in the stomach. Electronically Signed   By: Titus Dubin M.D.   On: 08/31/2021 17:19   EEG adult  Result Date: 09/08/2021 Derek Jack, MD     08/12/2021  2:13 PM Routine EEG Report [name] [name] is a 60 y.o. male with a history of encephalopathy who is undergoing an EEG to evaluate for seizures. Report: This EEG was acquired with electrodes placed according to the International 10-20 electrode system (including Fp1, Fp2, F3, F4, C3, C4, P3, P4, O1, O2, T3, T4, T5, T6, A1, A2, Fz, Cz, Pz). The following electrodes were missing or displaced: none. The occipital dominant rhythm was 5-6 Hz with intermittent more severe diffuse slowing in the delta range. This activity is reactive to stimulation. Drowsiness was manifested by background fragmentation; deeper stages of sleep were identified by K complexes and sleep spindles. There was no focal slowing. There were no interictal epileptiform discharges. There were no electrographic seizures identified. Photic stimulation and hyperventilation were not performed. Impression and clinical correlation: This EEG was obtained while awake and asleep and is abnormal due to moderate diffuse slowing with intermittent severe diffuse slowing indicative of global cerebral dysfunction. Su Monks, MD [facility] [phone] If 7pm- 7am, please page neurology on call as  listed in [place].   EEG adult  Result Date: 08/30/2021 Derek Jack, MD     08/30/2021  6:24 PM Routine EEG Report [name] [name] is a 60 y.o. male with a history of altered mental status who is undergoing an EEG to evaluate for seizures. Report: This EEG was acquired with electrodes placed according to the International 10-20 electrode system (including Fp1, Fp2, F3, F4, C3, C4, P3, P4, O1, O2, T3, T4, T5, T6, A1, A2, Fz, Cz, Pz). The following electrodes were missing or displaced: none. The occipital dominant rhythm was 5-7 Hz. This activity is reactive to stimulation. Drowsiness was manifested by background fragmentation; deeper stages of sleep were manifested by K complexes and sleep spindles. There was no focal slowing. There were no interictal epileptiform discharges. There were no electrographic seizures identified. Photic stimulation and hyperventilation were not performed. Impression and clinical correlation: This EEG was obtained while awake and asleep and is abnormal due to moderate diffuse slowing, indicative of global cerebral dysfunction. Su Monks, MD [facility] [phone] If 7pm- 7am, please page neurology on call as listed in [place].    Physical Exam: Vitals:   08/20/2021 1135 08/24/2021 1551  BP: (!) 155/27 (!) 153/43  Pulse: 88 91  Resp: 16 18  Temp: 98.7 F (37.1 C) 98.4 F (36.9 C)  TempSrc: Axillary Axillary  SpO2: 98% 100%     General: Open eyes to voice, does not respond to commands; on O2 [place]; NAD; NGT R nare Lungs: CTA bilaterally. No wheeze, rales or rhonchi. Breathing is unlabored. Heart: RRR. No murmur, rubs or gallops.  Abdomen: soft, nontender, active bowel sounds Lower extremities: 1+ edema BLUE and L hip; no edema BLLE Neuro: Opens eyes to voice but doesn't follow simple commands Dialysis Access: R AVG Hero (+) Bruit/Thrill  Dialysis Orders:  MWF-[facility] obtain outpatient records Plan for patient to discharge to SNF in  [place] and resume HD at [facility]currently held  Last Labs: Hgb 9.5, K 4.6, Ca 9.3, P 7.8 (08/28/21), Alb 2.7 (08/28/21)  Assessment/Plan: Enterococcus faecalis bacteremia/Aortic valve endocarditis. ID following-plan for 8-week course vancomycin with HD (9/3-10/29/22)-repeat MRI spine in 2 weeks (1st week in October).  Osteomyelitis C5-C6/Discitis L4-L5-ID following, continue ABX Acute Encephalopathy with acute infarct on brain MRI-Neuro following; plan for EEG here and LTM EEG; continue Keppra ESRD - originally on HD MWF but changed to TTS while at [facility] for HD 9/24 Hypertension/volume  - Blood pressure trend variable, elevated today. Noted 1+ edema but doesn't appear in acute respiratory distress; HD tomorrow.  Anemia of CKD - Hgb 9.5-patient on low-dose Epogen at [facility]. Need to obtain outpatient records from [facility]. More likely will resume here.  Secondary Hyperparathyroidism - Ca ok but last PO4 from 9/19 high, will check PO4 with HD in AM. Continue binders if able to tolerate taking them by mouth. Nutrition - NGT in place; advance to renal diet with restrictions when clinically stable. 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 Reviewed last palliative care note-parents need more time to make complex decisions. Plan for palliative to follow-up with patient's parents 9/24 at 12pm.  Tobie Poet, NP [facility] 08/18/2021, 5:19 PM

## 2021-09-01 NOTE — Progress Notes (Signed)
   Palliative Medicine Inpatient Follow Up Note     Chart Reviewed. Patient assessed at the bedside.   Patient lethargic, minimally responsive, ill appearing. No family present at the bedside.   Patient was initially seen by my colleague Elmo Putt, NP when hospitalized at [facility]. At that time family made decisions to focus on comfort and no escalation of care. This was rescinded later after discussions with Neurologist. Family agreed with continuing DNR/DNI.   I spoke with parents at length via phone. Updates provided. Reviewed goals of care including further discussions regarding patient's co-morbidities and poor prognosis holistically. Both parents emotional. Support provided.   I approached discussions around nutrition in the setting patient remains lethargic and unable to safely receive oral nutrition. Mother shares tearfully, she does not wish for any forms of artificial feedings whether temporary or long-term. She shares patient has suffered for years and they do not wish to prolong his life by artifical nutrition.   Parents clear in expressed wishes to continue with current plan of care. They are questioning their decisions to rescind comfort care but with reservations as they do not want to miss an opportunity for patient to improve/stabilize if their is a chance. I shared patient most likely will not have a meaningful recovery despite interventions given co-morbidities and decline in his health and quality of life over the years and now collectively playing a large role in his current state. Family verbalized their understanding and appreciation. They are considering re-focusing on patient's comfort.   We discussed hospice and comfort focused care while hospitalized. They have requested a follow up phone discussion on tomorrow.   Discussed the importance of continued conversation with family and their  medical providers regarding overall plan of care and treatment options, ensuring decisions  are within the context of the patients values and GOCs.   All questions answered and support provided.   Objective Assessment: Vital Signs Vitals:   09/04/2021 1135  BP: (!) 155/27  Pulse: 88  Resp: 16  Temp: 98.7 F (37.1 C)  SpO2: 98%   No intake or output data in the 24 hours ending 08/16/2021 1524    Lethargic, ill appearing   Unable to assess mentation, minimally responsive, withdraws to        painful stimuli    SUMMARY OF RECOMMENDATIONS   Continue with current plan of care per medical team  Family requesting time to make complex decisions.  Extensive discussions with parents regarding poor prognosis and disease trajectory. Parents considering re-focusing on patient's comfort.  PMT will continue to support and follow. Plans to follow-up with parents tomorrow @ 60.  Please secure chat for urgent needs.   Discussed with Dr. Lorin Mercy and Dr. Rory Percy  Time Total: 65 min.   Visit consisted of counseling and education dealing with the complex and emotionally intense issues of symptom management and palliative care in the setting of serious and potentially life-threatening illness.Greater than 50%  of this time was spent counseling and coordinating care related to the above assessment and plan.  Alda Lea, AGPCNP-BC  Palliative Medicine Team [phone]  Palliative Medicine Team providers are available by phone from 7am to 7pm daily and can be reached through the team cell phone. Should this patient require assistance outside of these hours, please call the patient's attending physician.

## 2021-09-01 NOTE — H&P (Signed)
History and Physical    [name] [name] QTM:[id number] DOB: 31-Aug-1961 DOA: 09/07/2021  PCP: Merryl Hacker No Consultants:  [facility] - nephrology Patient coming from:  Home; NOK: Mother, Daouda Lonzo, [phone]  Chief Complaint: AMS  HPI: [name] [name] is a 60 y.o. 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  He was transferred to [facility] from [facility] today after presenting there on 8/30 with back pain.   His neurologic status remained poor and so his parents made him DNR with comfort care status, but neurology recommended seizure medications based on EEG and he had some improvement neurologically and so they changed his status and he was transferred to [facility] for further neurologic evaluation.  He is currently responsive only to pain, with withdrawing and moans and nonsensical speech.    Review of Systems: Unable to perform   Past Medical History:  Diagnosis Date   Anemia of chronic disease    BL Hgb ~10   CHF (congestive heart failure) ([place])    pt denies 04/01/14   Diverticulitis    End-stage renal disease on hemodialysis (HCC)    HD m/w/f - right arm graft   History of blood transfusion    Hypertension    Pneumonia    hx of   Pneumonia    Polysubstance abuse ([place])    Renal insufficiency    Secondary hyperparathyroidism ([place])    Sleep disturbance    Tobacco abuse     Past Surgical History:  Procedure Laterality Date   APPENDECTOMY     CHEST TUBE INSERTION  02/2013   COLONOSCOPY  12/16/2012   Procedure: COLONOSCOPY;  Surgeon: Milus Banister, MD;  [place];  Service: Endoscopy;  Laterality: N/A;   IR FLUORO GUIDE CV LINE LEFT  08/18/2021   IR FLUORO GUIDE CV LINE LEFT  08/29/2021   IR US GUIDANCE   08/18/2021   PLEURAL EFFUSION DRAINAGE  12/18/2012   Procedure: DRAINAGE OF PLEURAL EFFUSION;  Surgeon: Melrose Nakayama, MD;  Location: [place];  Service: Thoracic;  Laterality: Right;   surgery for horseshoe kidney     TALC PLEURODESIS Right 02/05/2013   Procedure: Pietro Cassis;  Surgeon: Melrose Nakayama, MD;  Location: [place];  Service: Thoracic;  Laterality: Right;   TEE WITHOUT CARDIOVERSION N/A 01/16/2013   Procedure: TRANSESOPHAGEAL ECHOCARDIOGRAM (TEE);  Surgeon: Sanda Klein, MD;  Location: [facility] ENDOSCOPY;  Service: Cardiovascular;  Laterality: N/A;   TEE WITHOUT CARDIOVERSION Right 08/16/2021   Procedure: TRANSESOPHAGEAL ECHOCARDIOGRAM (TEE);  Surgeon: Dionisio David, MD;  Location: [facility];  Service: Cardiovascular;  Laterality: Right;   VIDEO ASSISTED THORACOSCOPY  12/18/2012   Procedure: VIDEO ASSISTED THORACOSCOPY;  Surgeon: Melrose Nakayama, MD;  Location: [place];  Service: Thoracic;  Laterality: Right;  pleural peel   VIDEO ASSISTED THORACOSCOPY (VATS)/THOROCOTOMY Right 02/19/2013   Procedure: VIDEO ASSISTED THORACOSCOPY (VATS)/THOROCOTOMY;  Surgeon: Melrose Nakayama, MD;  Location: [place];  Service: Thoracic;  Laterality: Right;  Right Video Assisted Thoracoscopy, Right Thoracotomy, Decortication,    VIDEO BRONCHOSCOPY WITH INSERTION OF INTERBRONCHIAL VALVE (IBV)  01/09/2013   Procedure: VIDEO BRONCHOSCOPY WITH INSERTION OF INTERBRONCHIAL VALVE (IBV);  Surgeon: Melrose Nakayama, MD;  Location: [place];  Service: Thoracic;  Laterality: N/A;  placement  of 2 Intrabronchial valves in right lower lobe   VIDEO BRONCHOSCOPY WITH INSERTION OF INTERBRONCHIAL VALVE (IBV) Right 02/19/2013   Procedure: VIDEO BRONCHOSCOPY WITH INSERTION OF INTERBRONCHIAL VALVE (IBV);  Surgeon: Melrose Nakayama, MD;  Location: [place];  Service: Thoracic;  Laterality: Right;  removal of two interbronchial valves right chest    Social History   Socioeconomic History   Marital status: Single     Spouse name: Not on file   Number of children: 2   Years of education: 69   Highest education level: Not on file  Occupational History   Occupation: disabled    Comment: previously drove trucks  Tobacco Use   Smoking status: Every Day    Packs/day: 6.00    Years: 20.00    Pack years: 120.00    Types: Cigarettes   Smokeless tobacco: Current  Vaping Use   Vaping Use: Never used  Substance and Sexual Activity   Alcohol use: Yes    Alcohol/week: 3.0 standard drinks    Types: 3 Cans of beer per week    Comment: occasional   Drug use: Yes    Types: Marijuana, Cocaine    Comment: remote cocaine abuse, ongoing marijuana use.   Sexual activity: Not on file  Other Topics Concern   Not on file  Social History Narrative   Not on file   Social Determinants of Health   Financial Resource Strain: Not on file  Food Insecurity: Not on file  Transportation Needs: Not on file  Physical Activity: Not on file  Stress: Not on file  Social Connections: Not on file  Intimate Partner Violence: Not on file    Allergies  Allergen Reactions   Other     Other reaction(s): Other (See Comments) blister   Tape Other (See Comments)    Plastic Tape Causes blister    Family History  Problem Relation Age of Onset   Thyroid disease Mother    Renal Disease Maternal Grandmother    Diabetes Maternal Grandmother    Hypertension Maternal Aunt    Hypertension Maternal Uncle     Prior to Admission medications   Medication Sig Start Date End Date Taking? Authorizing Provider  ampicillin IVPB Inject 2 g into the vein every 12 (twelve) hours. Indication:  Enterococcus faecalis Bacteremia, lumbar discitis and endocarditis Endocarditis First Dose: Yes Last Day of Therapy:  09/23/21 Labs - Once weekly:CBC with differential and CMP  Line will have to be removed at the end of antibiotic treatment Fax weekly labs to  Dr.Ravishankar [phone] Method of administration may be changed at the  discretion of home infusion pharmacist based upon assessment of the patient and/or caregiver's ability to self-administer the medication ordered. 08/21/21 09/27/21  Aline August, MD  apixaban (ELIQUIS) 2.5 MG TABS tablet Take 1 tablet (2.5 mg total) by mouth 2 (two) times daily. 08/21/21   Aline August, MD  cefTRIAXone (ROCEPHIN) IVPB Inject 2 g into the vein every 12 (twelve) hours. Indication:   Enterococcus faecalis Bacteremia, lumbar discitis and endocarditis Endocarditis First Dose: Yes Last Day of Therapy:  09/23/21 Labs - Once weekly:CBC with differential and CMP  Line will have to be removed at the end of antibiotic treatment Fax weekly labs to  [place] [phone] Method of administration: IV Push Method of administration may be changed at the discretion of home infusion pharmacist based upon assessment of the patient and/or caregiver's ability to self-administer the medication ordered. 08/21/21 09/27/21  Aline August, MD  lidocaine (  LIDODERM) 5 % Place 2 patches onto the skin daily. Remove & Discard patch within 12 hours or as directed by MD 08/21/21   Aline August, MD  methocarbamol (ROBAXIN) 500 MG tablet Take 1 tablet (500 mg total) by mouth every 6 (six) hours as needed for muscle spasms. 08/21/21   Aline August, MD  oxyCODONE-acetaminophen (PERCOCET/ROXICET) 5-325 MG tablet Take 1 tablet by mouth every 6 (six) hours as needed for moderate pain. 08/21/21   Aline August, MD  polyethylene glycol (MIRALAX / GLYCOLAX) 17 g packet Take 17 g by mouth daily as needed for moderate constipation. 08/21/21   Aline August, MD  senna-docusate (SENOKOT-S) 8.6-50 MG tablet Take 1 tablet by mouth 2 (two) times daily. 08/21/21   Aline August, MD  sevelamer carbonate (RENVELA) 800 MG tablet Take 2 tablets (1,600 mg total) by mouth 3 (three) times daily with meals. 08/21/21   Aline August, MD    Physical Exam: Vitals:   08/25/2021 1135  BP: (!) 155/27  Pulse: 88  Resp: 16  Temp:  98.7 F (37.1 C)  TempSrc: Axillary  SpO2: 98%     General:  Appears quite ill, responsive minimally and only to painful stimuli Eyes:  normal lids, iris ENT:  grossly normal lips, mildly dry mm due to mouth breathing; suboptimal dentition Neck:  no LAD, masses or thyromegaly Cardiovascular:  RRR, no m/r/g. No LE edema.  Respiratory:   CTA bilaterally with no wheezes/rales/rhonchi.  Normal respiratory effort. Abdomen:  soft, appears generally TTP - appears to respond to pain, ND Skin:  no rash or induration seen on limited exam Musculoskeletal:  no bony abnormality Psychiatric:  groans and appears to withdraw from painful stimuli Neurologic:  unable to perform    Radiological Exams on Admission: Independently reviewed - see discussion in A/P where applicable  DG Abd 1 View  Result Date: 08/31/2021 CLINICAL DATA:  Enteric tube placement. EXAM: ABDOMEN - 1 VIEW COMPARISON:  CT abdomen pelvis dated November 13, 2019. FINDINGS: Enteric tube in the stomach. Partially visualized HERO graft. The bowel gas pattern is normal. No radio-opaque calculi or other significant radiographic abnormality are seen. IMPRESSION: 1. Enteric tube in the stomach. Electronically Signed   By: Titus Dubin M.D.   On: 08/31/2021 17:19   EEG adult  Result Date: 08/27/2021 Derek Jack, MD     09/04/2021  2:13 PM Routine EEG Report NISHAWN ROTAN is a 60 y.o. male with a history of encephalopathy who is undergoing an EEG to evaluate for seizures. Report: This EEG was acquired with electrodes placed according to the International 10-20 electrode system (including Fp1, Fp2, F3, F4, C3, C4, P3, P4, O1, O2, T3, T4, T5, T6, A1, A2, Fz, Cz, Pz). The following electrodes were missing or displaced: none. The occipital dominant rhythm was 5-6 Hz with intermittent more severe diffuse slowing in the delta range. This activity is reactive to stimulation. Drowsiness was manifested by background fragmentation; deeper stages of  sleep were identified by K complexes and sleep spindles. There was no focal slowing. There were no interictal epileptiform discharges. There were no electrographic seizures identified. Photic stimulation and hyperventilation were not performed. Impression and clinical correlation: This EEG was obtained while awake and asleep and is abnormal due to moderate diffuse slowing with intermittent severe diffuse slowing indicative of global cerebral dysfunction. Su Monks, MD [facility] [phone] If 7pm- 7am, please page neurology on call as listed in [place].   EEG adult  Result Date: 08/30/2021 Su Monks  Jerilynn Mages, MD     08/30/2021  6:24 PM Routine EEG Report YADER CRIGER is a 60 y.o. male with a history of altered mental status who is undergoing an EEG to evaluate for seizures. Report: This EEG was acquired with electrodes placed according to the International 10-20 electrode system (including Fp1, Fp2, F3, F4, C3, C4, P3, P4, O1, O2, T3, T4, T5, T6, A1, A2, Fz, Cz, Pz). The following electrodes were missing or displaced: none. The occipital dominant rhythm was 5-7 Hz. This activity is reactive to stimulation. Drowsiness was manifested by background fragmentation; deeper stages of sleep were manifested by K complexes and sleep spindles. There was no focal slowing. There were no interictal epileptiform discharges. There were no electrographic seizures identified. Photic stimulation and hyperventilation were not performed. Impression and clinical correlation: This EEG was obtained while awake and asleep and is abnormal due to moderate diffuse slowing, indicative of global cerebral dysfunction. Su Monks, MD [facility] [phone] If 7pm- 7am, please page neurology on call as listed in [place].    EKG: Not done   Labs on Admission: I have personally reviewed the available labs and imaging studies at the time of the admission.  Pertinent labs, last on 9/21:   BUN 56/Creatinine  7.61/GFR 8 HS troponin 437, 374 WBC 9.4 Hgb 9.5 Platelets 117 COVID negative on 8/30, 9/12   Assessment/Plan Principal Problem:   Endocarditis Active Problems:   End-stage renal disease on hemodialysis (HCC)   Polysubstance abuse ([place])   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 (HCC)   Chronic combined systolic and diastolic CHF (congestive heart failure) (HCC)   Goals of care, counseling/discussion   DNR (do not resuscitate)    Enterococcus faecalis bacteremia with associated aortic valve endocarditis -Patient is afebrile -per ID recommendations at [facility], will need amp + CTX x6 weeks total (until 10/15) followed by amoxicillin. Follow up appointment 10/13 with ID.  -scopolamine patch for oral secretions -ID at [facility] has been consulted, as well   osteomyelitis in C5-C6, discitis in L4-L5 -Left psoas muscle infection with superimposed 1.3 cm phlegmon/early abscess formation -Continue antibiotics and recommendations from ID.   Acute encephalopathy with Acute infarct on brain MRI -Based on multifactorial organ failure, family had made the decision to change to comfort case -However, he has had EEG at [facility] and was started on AEDs with ?improvement -He was thus changed back to treatment for now to evaluate seizure contribution -Neurology is following -EEG here and LTM EEG ordered -Palliative care is on board -Keppra monotherapy for now, with addition of Depakote and then Vimpat if needed  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 -Eliquis 2.5 mg BID.      End-stage renal disease on hemodialysis -Patient on chronic MWF HD; changed to TTS schedule while at [facility] -Nephrology prn order set utilized -He does not appear to be volume overloaded or otherwise in need of acute HD -Nephrology has been notified that patient will need HD   Chronic combined CHF -LVEF of 30 to 35% on recent echo.   -Volume managed by dialysis.   Goals of care -continue  family/palliative conversations -Per palliative care, family is very upset and leaning heavily toward returning to comfort measures - but not quite there yet. -Family requests no tube feeds at this time. -He is DNR.    Note: This patient has been tested and is negative for the novel coronavirus COVID-19.    Level of care: Progressive DVT prophylaxis: Eliquis Code Status:  DNR Family Communication: None present; palliative care spoke with both of the patients' parents today. Disposition Plan:  The patient is from: home  Anticipated d/c is to: be determined  Anticipated d/c date will depend on clinical response to treatment  Patient is currently: acutely ill Consults called: Nephrology; ID; Neurology; palliative care; nutrition  Admission status:  Admit - It is my clinical opinion that admission to INPATIENT is reasonable and necessary because of the expectation that this patient will require hospital care that crosses at least 2 midnights to treat this condition based on the medical complexity of the problems presented.  Given the aforementioned information, the predictability of an adverse outcome is felt to be significant.    Karmen Bongo MD [facility]   How to contact the [facility] Attending or Consulting provider [place] or covering provider during after hours [place], for this patient?  Check the care team in [facility] and look for a) attending/consulting TRH provider listed and b) the [facility] team listed Log into [email or web address] and use [place]'s universal password to access. If you do not have the password, please contact the hospital operator. Locate the [facility] provider you are looking for under [facility] and page to a number that you can be directly reached. If you still have difficulty reaching the provider, please page the [facility] (Director on Call) for the Hospitalists listed on amion for assistance.   08/24/2021, 2:59 PM

## 2021-09-01 NOTE — Progress Notes (Signed)
EEG completed, results pending. 

## 2021-09-01 NOTE — Progress Notes (Signed)
Initial Nutrition Assessment  DOCUMENTATION CODES:   Not applicable  INTERVENTION:   If tube feeds initiated, recommend:  Nepro 1.8@60ml /hr- Initiate at 81ml/hr and increase by 56ml/hr q 8 hours until goal rate is reached.   Pro-Source 78ml daily via tube, provides 40kcal and 11g of protein per serving   Free water flushes 9ml q4 hours to maintain tube patency   Regimen provides 2632kcal/day, 128g/day protein and 1260ml/day free water   Rena-vit daily via tube   Vitamin C 500mg  BID via tube   Pt at high refeed risk; recommend monitor potassium, magnesium and phosphorus labs daily until stable  NUTRITION DIAGNOSIS:   Inadequate oral intake related to acute illness (AMS, acute infarct) as evidenced by NPO status.  GOAL:   Patient will meet greater than or equal to 90% of their needs  MONITOR:   Labs, Weight trends, TF tolerance, Skin, I & O's  REASON FOR ASSESSMENT:   Consult Assessment of nutrition requirement/status  ASSESSMENT:   60 y.o. male with past medical history of ESRD on HD MWF, hypertension, tobacco abuse, polysubstance abuse, diverticulitis, anemia, jugular vein thrombosis, GI bleeding, right pneumothorax admitted on 08/08/2021 with lower back pain and found bacteremia, endocarditis, discitis complicated by prolonged hospitalization r/t ongoing encephalopathy with fluctuating agitation and lethargy.  RD working remotely.  Unable to speak with pt r/t AMS. Pt with poor appetite and oral intake since admission to the hospital on 8/30. No new weight in chart since 9/7. Pt s/p nasogastric tube placement yesterday. Pt currently headed to MRI. Spoke with MD, pt changed to comfort care on 9/21 but this was reversed by family. Family undecided about whether or not to provide tube feeds; will hold off on tube feeds for now per MD. RD will monitor for GOC vs the need for nutrition support.   Medications reviewed and include: renvela, ampicillin, ceftriaxone, LRS  @75ml /hr   Labs reviewed: K 4.6 wnl, BUN 56(H), creat 7.61(H) Hgb 9.5(L), Hct 30.8(L) 105, 116 X 24 HRS  NUTRITION - FOCUSED PHYSICAL EXAM: Unable to perform at this time   Diet Order:   Diet Order     None      EDUCATION NEEDS:   No education needs have been identified at this time  Skin:  Skin Assessment: Reviewed RN Assessment (Stage II buttocks)  Last BM:  9/20  Height:   Ht Readings from Last 1 Encounters:  08/16/21 6\' 1"  (1.854 m)    Weight:   Wt Readings from Last 1 Encounters:  08/16/21 91 kg    Ideal Body Weight:  83.6 kg  BMI:  There is no height or weight on file to calculate BMI.  Estimated Nutritional Needs:   Kcal:  2400-270kcal/day  Protein:  >120g/day  Fluid:  UOP +1L  Koleen Distance MS, RD, LDN Please refer to [facility] for RD and/or RD on-call/weekend/after hours pager

## 2021-09-01 NOTE — Progress Notes (Signed)
Pharmacy Antibiotic Note  [name] [name] is a 60 y.o. male admitted on 08/11/2021 with E. Faecalis Endocarditis  Pharmacy has been consulted for Vancomycin dosing.  ESRD, hx of mssa bacteremia tx in 2014 (no endocarditis), IJ vein thrombosis, admitted to [facility] 8/30 with lower back pain found to have e faecalis endocarditis, started on appropriate abx, course complicated by seizure, initially transitioned to hospice, but since revered to full medical care (dnr/dni), transferred to [facility] for further management in setting ongoing seizure.  Further GOC plans to take place tomorrow.  Plan: Vanc 1000 mg IV x1 dose today per ID F/u with goals of care for further dosing    Temp (24hrs), Avg:98.4 F (36.9 C), Min:98 F (36.7 C), Max:98.7 F (37.1 C)  Recent Labs  Lab 08/26/21 0525 08/28/21 0437 08/30/21 0420  WBC 9.7 10.6* 9.4  CREATININE 6.84* 9.96* 7.61*    CrCl cannot be calculated (Unknown ideal weight.).    Allergies  Allergen Reactions   Other     Other reaction(s): Other (See Comments) blister   Tape Other (See Comments)    Plastic Tape Causes blister    Antimicrobials this admission: Ampicillin 9/3 >> 9/23 Ceftriaxone 9/3 >> 9/23 Vanc 9/23  Dose adjustments this admission:   Microbiology results: 9/2 BCx: F.Faecalis   Thank you for allowing pharmacy to be a part of this patient's care.  Lestine Box, PharmD PGY2 Infectious Diseases Pharmacy Resident   Please check [email or web address] for unit-specific pharmacy phone numbers

## 2021-09-01 NOTE — Procedures (Signed)
Routine EEG Report  [name] [name] is a 60 y.o. male with a history of encephalopathy who is undergoing an EEG to evaluate for seizures.  Report: This EEG was acquired with electrodes placed according to the International 10-20 electrode system (including Fp1, Fp2, F3, F4, C3, C4, P3, P4, O1, O2, T3, T4, T5, T6, A1, A2, Fz, Cz, Pz). The following electrodes were missing or displaced: none.  The occipital dominant rhythm was 5-6 Hz with intermittent more severe diffuse slowing in the delta range. This activity is reactive to stimulation. Drowsiness was manifested by background fragmentation; deeper stages of sleep were identified by K complexes and sleep spindles. There was no focal slowing. There were no interictal epileptiform discharges. There were no electrographic seizures identified. Photic stimulation and hyperventilation were not performed.  Impression and clinical correlation: This EEG was obtained while awake and asleep and is abnormal due to moderate diffuse slowing with intermittent severe diffuse slowing indicative of global cerebral dysfunction.   Su Monks, MD [facility] [phone]  If 7pm- 7am, please page neurology on call as listed in [place].

## 2021-09-01 NOTE — Discharge Summary (Addendum)
This 60 year old African American gentlemen with history significant for ESRD, (HD TTS), hypertension, polysubstance abuse, diverticulitis, anemia, jugular vein thrombosis, GI bleeding right pneumothorax presented to the ER with worsening lower back pain. He was found to have sepsis secondary to enterococcus faecalis bacteremia resultant from possible early discitis in L4-L5, osteomyelitis in C5-C6 with left psoas muscle abscess formation. TEE showed aortic valve endocarditis. He was started on ampicillin and rocephin.  Due to encephalapothy, MRI completed and showed areas of acute and subacute infarcts in left temporal lobe and right insula.  Neuro status remained poor with lethargy and periods of agitation. . No meaningful communication or following of commands. Parents made patient a DNR and comfort care secondary to poor neuro status.  Neurology was consulted and patient was started on antiseizure medications secondary to EEG findings. Per neurologist, there  was improvement in neuro function and LTM EEG monitoring was recommended.  Parent were presented options and recommendations.  The agreed to transfer of patient for further eval and reversed comfort measures only, however he is still DNR/DNI status He remains on ampicillin and ceftriaxone for his bacteremia.  NGT placed for nutrition and meds.  He remains on eliquis for prevention of dialysis catheter clotting  Physical Exam General: NAD, resting quietly  Head: Normocephalic,atraumatic  Lungs Lungs clear to auscultation bilaterally  Heart: Regular rate and rhythm  Abdomen:  Soft, nontender, nondistended  Extremities: Trace to 1+ peripheral edema  Neurologic: Lethargic, opens eyes to  tactile stimulus, no commands followed. Non erbal. Spontaneous movements of BUE and LLE, did not see spontaneous movement in RLE  Skin: No acute lesions or rashes    Enterococcus faecalis bacteremia  Aortic valve endocarditis- remains afebrile -per ID  recommendations (note 9/9) will need amp + CTX x6 weeks total (until 10/15) followed by amoxicillin. Follow up appointment 10/13 with ID.  - scopolamine patch for oral secretions   osteomyelitis in C5-C6, discitis in L4-L5 Left psoas muscle infection with superimposed 1.3 cm phlegmon/early abscess formation -Continue antibiotics and recommendations from ID.  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 Keturah Barre recommendations.     End-stage renal disease on hemodialysis -Nephrology following.  Dialysis as per nephrology schedule -Dialysis schedule changed from M WF to TTS   Anemia of chronic disease -From renal failure - follow with dialysis labs  H/o Hypertension. Currently Hypotension -Blood pressure currently stable.  Off amlodipine  Chronic combined CHF -LVEF of 30 to 35% as per TTE on 08/13/2021.  Volume managed by dialysis.  Strict input output.  Daily weights.  Fluid restriction  Depression- stable   Generalized conditioning -PT/OT recommend. Social worker following.   Goals of care -continue family/palliative conversations   DVT prophylaxis: apixaban (ELIQUIS) tablet 2.5 mg Start: 08/30/21 2200 apixaban (ELIQUIS) tablet 2.5 mg    Admission - 08/08/2021 Discharge 09/03/2021

## 2021-09-01 NOTE — Progress Notes (Signed)
Asked to see patient due to tunneled Power Line pulled and cuff out to exit site. Able to remove remaining line without resistance. Tolerated well. Vaseline gauze applied to site, pressure held, no bleeding noted. RN aware. Will continue to monitor.

## 2021-09-02 DIAGNOSIS — Z66 Do not resuscitate: Secondary | ICD-10-CM | POA: Diagnosis not present

## 2021-09-02 DIAGNOSIS — R569 Unspecified convulsions: Secondary | ICD-10-CM

## 2021-09-02 DIAGNOSIS — I5042 Chronic combined systolic (congestive) and diastolic (congestive) heart failure: Secondary | ICD-10-CM | POA: Diagnosis not present

## 2021-09-02 DIAGNOSIS — I330 Acute and subacute infective endocarditis: Secondary | ICD-10-CM | POA: Diagnosis not present

## 2021-09-02 DIAGNOSIS — G928 Other toxic encephalopathy: Secondary | ICD-10-CM | POA: Diagnosis not present

## 2021-09-02 DIAGNOSIS — I82C11 Acute embolism and thrombosis of right internal jugular vein: Secondary | ICD-10-CM | POA: Diagnosis not present

## 2021-09-02 LAB — HEPATITIS B SURFACE ANTIBODY,QUALITATIVE: Hep B S Ab: REACTIVE — AB

## 2021-09-02 LAB — PHOSPHORUS: Phosphorus: 9.4 mg/dL — ABNORMAL HIGH (ref 2.5–4.6)

## 2021-09-02 LAB — HEPATITIS B SURFACE ANTIGEN: Hepatitis B Surface Ag: NONREACTIVE

## 2021-09-02 LAB — CBC
HCT: 29.4 % — ABNORMAL LOW (ref 39.0–52.0)
Hemoglobin: 9.1 g/dL — ABNORMAL LOW (ref 13.0–17.0)
MCH: 31.5 pg (ref 26.0–34.0)
MCHC: 31 g/dL (ref 30.0–36.0)
MCV: 101.7 fL — ABNORMAL HIGH (ref 80.0–100.0)
Platelets: 94 10*3/uL — ABNORMAL LOW (ref 150–400)
RBC: 2.89 MIL/uL — ABNORMAL LOW (ref 4.22–5.81)
RDW: 23.7 % — ABNORMAL HIGH (ref 11.5–15.5)
WBC: 9.5 10*3/uL (ref 4.0–10.5)
nRBC: 2.4 % — ABNORMAL HIGH (ref 0.0–0.2)

## 2021-09-02 LAB — BASIC METABOLIC PANEL
Anion gap: 19 — ABNORMAL HIGH (ref 5–15)
BUN: 87 mg/dL — ABNORMAL HIGH (ref 6–20)
CO2: 23 mmol/L (ref 22–32)
Calcium: 9.9 mg/dL (ref 8.9–10.3)
Chloride: 97 mmol/L — ABNORMAL LOW (ref 98–111)
Creatinine, Ser: 11.6 mg/dL — ABNORMAL HIGH (ref 0.61–1.24)
GFR, Estimated: 5 mL/min — ABNORMAL LOW (ref >60)
Glucose, Bld: 92 mg/dL (ref 70–99)
Potassium: 6 mmol/L — ABNORMAL HIGH (ref 3.5–5.1)
Sodium: 139 mmol/L (ref 135–145)

## 2021-09-02 LAB — GLUCOSE, CAPILLARY: Glucose-Capillary: 99 mg/dL (ref 70–99)

## 2021-09-02 LAB — PHENYTOIN LEVEL, FREE AND TOTAL
Phenytoin, Free: 2.8 ug/mL — ABNORMAL HIGH (ref 1.0–2.0)
Phenytoin, Total: 9.7 ug/mL — ABNORMAL LOW (ref 10.0–20.0)

## 2021-09-02 MED ORDER — VANCOMYCIN HCL IN DEXTROSE 1-5 GM/200ML-% IV SOLN
1000.0000 mg | INTRAVENOUS | Status: DC
  Administered 2021-09-02 – 2021-09-05 (×2): 1000 mg via INTRAVENOUS
  Filled 2021-09-02 (×4): qty 200

## 2021-09-02 MED ORDER — SEVELAMER CARBONATE 2.4 G PO PACK
2.4000 g | PACK | Freq: Three times a day (TID) | ORAL | Status: DC
  Administered 2021-09-03 – 2021-09-05 (×5): 2.4 g
  Filled 2021-09-02 (×11): qty 1

## 2021-09-02 NOTE — Progress Notes (Signed)
PROGRESS NOTE  GARLAND HINCAPIE OZY:[id number] DOB: Apr 02, 1961 DOA: 08/13/2021 PCP: Pcp, No  Brief History   The patient is a 60 year old man who was transferred to [facility] on 09/07/2021 for higher level of care and continuous EEG monitoring. The patient himself is unable to provide a history. LEONA PRESSLY is a 60 y.o. 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  He was transferred to [facility] from [facility] today after presenting there on 8/30 with back pain.   His neurologic status remained poor and so his parents made him DNR with comfort care status, but neurology recommended seizure medications based on EEG and he had some improvement neurologically and so they changed his status and he was transferred to [facility] for further neurologic evaluation.  He is currently responsive only to pain, with withdrawing and moans and nonsensical speech.  The patient is currently on continuous EEG, and is awaiting HD this afternoon.   Consultants  Neurology Palliative Care Nephrology  Procedures  ESRD EEG  Antibiotics   Anti-infectives (From admission, onward)    Start     Dose/Rate Route Frequency Ordered Stop   09/02/21 1415  vancomycin (VANCOCIN) IVPB 1000 mg/200 mL premix        1,000 mg 200 mL/hr over 60 Minutes Intravenous Every T-Th-Sa (Hemodialysis) 09/02/21 1318     08/10/2021 1645  vancomycin (VANCOCIN) IVPB 1000 mg/200 mL premix        1,000 mg 200 mL/hr over 60 Minutes Intravenous  Once 09/07/2021 1550 09/04/2021 1711   08/26/2021 1200  ampicillin  Status:  Discontinued       Note to Pharmacy: Indication:  E faecalis Bacteremia, lumbar discitis/endocarditis  Last Day of Therapy:  09/23/21 Labs - Weekly CBC w diff, CMP  Line removed  at the end of treatment Fax weekly labs to  Dr.Ravishankar [phone]   2 g Intravenous Every 12 hours 08/26/2021 1101 08/17/2021 1110   08/14/2021 1200  cefTRIAXone (ROCEPHIN) IVPB  Status:  Discontinued       Note to Pharmacy: Indication:  E faecalis Bacteremia, lumbar discitis/endocarditis  Last Day of Therapy:  09/23/21 Labs - Weekly CBC w diff, CMP  Line removed at the end of treatment Fax weekly labs to  Dr.Ravishankar [phone]   2 g Intravenous Every 12 hours 08/17/2021 1101 09/04/2021 1110   08/12/2021 1200  ampicillin (OMNIPEN) 2 g in sodium chloride 0.9 % 100 mL IVPB  Status:  Discontinued        2 g 300 mL/hr over 20 Minutes Intravenous Every 12 hours 08/25/2021 1110 08/10/2021 1531   08/21/2021 1200  cefTRIAXone (ROCEPHIN) 2 g in sodium chloride 0.9 % 100 mL IVPB  Status:  Discontinued        2 g 200 mL/hr over 30 Minutes Intravenous Every 12 hours 09/03/2021 1110 08/22/2021 1531      Subjective  The patient is lying in bed with his eyes closed. He moves his right upper extremity to stimulus. He is dependent upon HD and NGT. He continues to receive IV antibiotics.  Objective   Vitals:  Vitals:   09/02/21 0413 09/02/21 1155  BP: (!) 170/43 (!) 186/46  Pulse: 87 90  Resp: 16 16  Temp: 99.1 F (37.3  C) 99.3 F (37.4 C)  SpO2: 100% 100%    Exam:  Constitutional:  The patient is lying with his eyes closed. He is not verbally responsive. He does move his right arm as I am auscultating his chest. Eyes are sharply deviated to the right.  Eyes:  pupils and irises appear normal EOMBI- Eyes are sharply deviated to the right. Negative dolls eyes. Normal lids and conjunctivae Respiratory:  CTA bilaterally, no w/r/r.  Respiratory effort normal. No retractions or accessory muscle use Cardiovascular:  RRR, no m/r/g No LE extremity edema   Normal pedal pulses Abdomen:  Abdomen appears normal; no tenderness or masses No hernias No HSM Musculoskeletal:  Digits/nails BUE: no  clubbing, cyanosis, petechiae, infection exam of joints, bones, muscles of at least one of following: head/neck, RUE, LUE, RLE, LLE   Skin:  No rashes, lesions, ulcers palpation of skin: no induration or nodules Neurologic:  Patient is unable to cooperate with exam. Psychiatric:  Patient is unable to cooperate with exam.  I have personally reviewed the following:   Today's Data  Vitals  Lab Data  CBC, BMP  Micro Data  Blood culture X2 positive for growth of E. Faecalis on 08/11/2021. Surveillance cultures from 08/12/2021  Imaging  Intracranial MRA  Cardiology Data  TEE EKG  Scheduled Meds:  apixaban  2.5 mg Per Tube BID   Chlorhexidine Gluconate Cloth  6 each Topical Q0600   sevelamer carbonate  2.4 g Per Tube TID WC   sodium chloride flush  3 mL Intravenous Q12H   Continuous Infusions:  sodium chloride     sodium chloride     levETIRAcetam 500 mg (09/02/21 0212)   vancomycin      Principal Problem:   Endocarditis Active Problems:   End-stage renal disease on hemodialysis (HCC)   Polysubstance abuse (HCC)   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 (HCC)   Chronic combined systolic and diastolic CHF (congestive heart failure) (HCC)   Goals of care, counseling/discussion   DNR (do not resuscitate)   Vertebral osteomyelitis ([place])   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 Follow up appointment 10/13 with ID.  - scopolamine patch for oral secretions   osteomyelitis in C5-C6, discitis in L4-L5 Left psoas muscle infection with superimposed 1.3 cm phlegmon/early abscess formation -Continue antibiotics and recommendations from ID.  Continue pain management and bowel regimen   Acute encephalopathy with Acute infarct on brain MRI- appears to me to be at stable  state of alertness - management and further workup of seizure contribution management per neurology - phenytoin level in process   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 -Eliquis 2.5 mg twice daily as per Dr. Keturah Barre recommendations.      End-stage renal disease on hemodialysis -Nephrology following.  Dialysis as per nephrology schedule -Dialysis schedule changed from M WF to TTS   Anemia of chronic disease -From renal failure - follow with dialysis labs  H/o Hypertension. Currently Hypotension -Blood pressure currently stable.  Off amlodipine  Chronic combined CHF -LVEF of 30 to 35% as per TTE on 08/13/2021.  Volume managed by dialysis.  Strict input output.  Daily weights.  Fluid restriction  Depression- stable   Generalized conditioning -PT/OT recommend. Social worker following.   Goals of care -continue family/palliative conversations  DVT prophylaxis: apixaban (ELIQUIS) tablet 2.5 mg Start: 08/30/21 2200 apixaban (ELIQUIS) tablet 2.5 mg  Code Status: DNR Family Communications: None available   Tsion Inghram, DO [facility] Direct contact: see [email or web address]  7PM-7AM contact night coverage as above 09/02/2021, 2:27 PM  LOS: 1 day

## 2021-09-02 NOTE — Procedures (Addendum)
Patient Name: [name] [name]  MRN: [id number]  Epilepsy Attending: Lora Havens  Referring Physician/Provider: Dr Amie Portland Duration: 09/07/2021 1155 to 09/02/2021 1155   Patient history: 60 yo M with waxing and waning mental status. EEG to evaluate for seizure   Level of alertness: lethargic   AEDs during EEG study: None   Technical aspects: This EEG study was done with scalp electrodes positioned according to the 10-20 International system of electrode placement. Electrical activity was acquired at a sampling rate of 500Hz  and reviewed with a high frequency filter of 70Hz  and a low frequency filter of 1Hz . EEG data were recorded continuously and digitally stored.    Description: No posterior dominant rhythm was seen. EEG showed continuous generalized 3 to 6 Hz theta-delta slowing. Hyperventilation and photic stimulation were not performed.      ABNORMALITY - Continuous slow, generalized   IMPRESSION: This study is suggestive of  moderate to severe diffuse encephalopathy, nonspecific etiology. No seizures or epileptiform discharges were seen throughout the recording.   Ruthell Feigenbaum Barbra Sarks

## 2021-09-02 NOTE — Progress Notes (Signed)
LTM was disconnected for MRI and not reconnected. Tech found this am and reconnected with maintained done. Neurology informed.

## 2021-09-02 NOTE — Progress Notes (Signed)
Pt transported by tech to dialysis. EEG monitor removed for HD treatment. Will reattach EEG equipment upon pt return.

## 2021-09-02 NOTE — Progress Notes (Addendum)
[facility] Progress Note   Subjective:    Seen and examined patient at bedside. Noted current K+ level of 6.0. Plan for HD later this evening.  Objective Vitals:   09/02/21 0034 09/02/21 0413 09/02/21 1155 09/02/21 1300  BP: (!) 180/46 (!) 170/43 (!) 186/46   Pulse: 89 87 90   Resp: 19 16 16    Temp: 98.2 F (36.8 C) 99.1 F (37.3 C) 99.3 F (37.4 C)   TempSrc: Axillary Axillary Axillary   SpO2: 100% 100% 100%   Weight:    91 kg   Physical Exam General: Open eyes to voice, does not respond to commands; on O2 [place]; NAD; NGT R nare Lungs: CTA bilaterally. No wheeze, rales or rhonchi. Breathing is unlabored. Heart: RRR. No murmur, rubs or gallops.  Abdomen: soft, nontender, active bowel sounds Lower extremities: 1+ edema BLUE and L hip; no edema BLLE Neuro: Opens eyes to voice but doesn't follow simple commands Dialysis Access: R AVG Hero (+) Bruit/Thrill  Filed Weights   09/02/21 1300  Weight: 91 kg    Intake/Output Summary (Last 24 hours) at 09/02/2021 1322 Last data filed at 08/11/2021 2227 Gross per 24 hour  Intake 444.96 ml  Output --  Net 444.96 ml    Additional Objective Labs: Basic Metabolic Panel: Recent Labs  Lab 08/28/21 0437 08/30/21 0420 09/02/21 0201  NA 138 139 139  K 4.6 4.6 6.0*  CL 97* 97* 97*  CO2 23 27 23   GLUCOSE 85 79 92  BUN 71* 56* 87*  CREATININE 9.96* 7.61* 11.60*  CALCIUM 10.2 9.3 9.9  PHOS 7.8*  --  9.4*   Liver Function Tests: Recent Labs  Lab 08/28/21 0437  ALBUMIN 2.7*   No results for input(s): LIPASE, AMYLASE in the last 168 hours. CBC: Recent Labs  Lab 08/28/21 0437 08/30/21 0420 09/02/21 0201  WBC 10.6* 9.4 9.5  HGB 9.8* 9.5* 9.1*  HCT 31.1* 30.8* 29.4*  MCV 100.0 101.0* 101.7*  PLT 137* 117* 94*   Blood Culture    Component Value Date/Time   SDES BLOOD BLOOD LEFT HAND 08/12/2021 2358   SDES BLOOD LEFT RADIAL 08/12/2021 2358   SPECREQUEST  08/12/2021 2358    BOTTLES DRAWN AEROBIC AND  ANAEROBIC Blood Culture adequate volume   SPECREQUEST  08/12/2021 2358    BOTTLES DRAWN AEROBIC AND ANAEROBIC Blood Culture results may not be optimal due to an inadequate volume of blood received in culture bottles   CULT  08/12/2021 2358    NO GROWTH 5 DAYS Performed at [facility], [address] [place], [place] [address]    CULT  08/12/2021 2358    NO GROWTH 5 DAYS Performed at [facility], [address] [place],  [address]    REPTSTATUS 08/18/2021 FINAL 08/12/2021 2358   REPTSTATUS 08/18/2021 FINAL 08/12/2021 2358    Cardiac Enzymes: No results for input(s): CKTOTAL, CKMB, CKMBINDEX, TROPONINI in the last 168 hours. CBG: Recent Labs  Lab 08/14/2021 0404 08/21/2021 0748 08/12/2021 1135 08/16/2021 1549 09/02/21 0757  GLUCAP 89 105* 116* 105* 99   Iron Studies: No results for input(s): IRON, TIBC, TRANSFERRIN, FERRITIN in the last 72 hours. Lab Results  Component Value Date   INR 1.1 04/16/2019   INR 1.12 03/27/2018   INR 1.14 12/05/2017   Studies/Results: DG Abd 1 View  Result Date: 08/31/2021 CLINICAL DATA:  Enteric tube placement. EXAM: ABDOMEN - 1 VIEW COMPARISON:  CT abdomen pelvis dated November 13, 2019. FINDINGS: Enteric tube in  the stomach. Partially visualized HERO graft. The bowel gas pattern is normal. No radio-opaque calculi or other significant radiographic abnormality are seen. IMPRESSION: 1. Enteric tube in the stomach. Electronically Signed   By: Titus Dubin M.D.   On: 08/31/2021 17:19   MR ANGIO HEAD WO CONTRAST  Result Date: 09/02/2021 CLINICAL DATA:  Follow-up examination for acute stroke. EXAM: MRA HEAD WITHOUT CONTRAST TECHNIQUE: Angiographic images of the Circle of Willis were acquired using MRA technique without intravenous contrast. COMPARISON:  Prior brain MRI from 08/26/2021. FINDINGS: Anterior circulation: Examination mildly degraded by motion artifact. Visualized distal cervical segments of the internal carotid  arteries are patent with antegrade flow. Petrous, cavernous, and supraclinoid segments patent without significant stenosis or other abnormality. A1 segments patent bilaterally. Normal anterior communicating artery complex. Anterior cerebral arteries patent to their distal aspects without stenosis. No M1 stenosis or occlusion. Normal MCA bifurcations. Distal MCA branches well perfused and symmetric. Posterior circulation: Visualized V4 segments widely patent. Right vertebral artery dominant. Neither PICA origin visualized. Basilar patent to its distal aspect without stenosis. Superior cerebral arteries patent bilaterally. Left P1 segment tortuous but widely patent. Both PCA supplied via the basilar as well as small bilateral posterior communicating arteries. PCAs well perfused to their distal aspects without stenosis. Anatomic variants: None significant. Other: No intracranial aneurysm. IMPRESSION: Negative intracranial MRA. No large vessel occlusion, hemodynamically significant stenosis, or other acute vascular abnormality. No aneurysm. Electronically Signed   By: Jeannine Boga M.D.   On: 09/02/2021 00:33   EEG adult  Result Date: 08/19/2021 Derek Jack, MD     08/27/2021  2:13 PM Routine EEG Report IVIS HENNEMAN is a 60 y.o. male with a history of encephalopathy who is undergoing an EEG to evaluate for seizures. Report: This EEG was acquired with electrodes placed according to the International 10-20 electrode system (including Fp1, Fp2, F3, F4, C3, C4, P3, P4, O1, O2, T3, T4, T5, T6, A1, A2, Fz, Cz, Pz). The following electrodes were missing or displaced: none. The occipital dominant rhythm was 5-6 Hz with intermittent more severe diffuse slowing in the delta range. This activity is reactive to stimulation. Drowsiness was manifested by background fragmentation; deeper stages of sleep were identified by K complexes and sleep spindles. There was no focal slowing. There were no interictal epileptiform  discharges. There were no electrographic seizures identified. Photic stimulation and hyperventilation were not performed. Impression and clinical correlation: This EEG was obtained while awake and asleep and is abnormal due to moderate diffuse slowing with intermittent severe diffuse slowing indicative of global cerebral dysfunction. Su Monks, MD [facility] [phone] If 7pm- 7am, please page neurology on call as listed in [place].   Overnight EEG with video  Result Date: 09/02/2021 Lora Havens, MD     09/02/2021  8:58 AM Patient Name: [name] [name] MRN: [id number] Epilepsy Attending: Lora Havens Referring Physician/Provider: Dr Amie Portland Duration: 09/02/2021 1155 to 09/02/2021 0900  Patient history: 60 yo M with waxing and waning mental status. EEG to evaluate for seizure  Level of alertness: lethargic  AEDs during EEG study: None  Technical aspects: This EEG study was done with scalp electrodes positioned according to the 10-20 International system of electrode placement. Electrical activity was acquired at a sampling rate of 500Hz  and reviewed with a high frequency filter of 70Hz  and a low frequency filter of 1Hz . EEG data were recorded continuously and digitally stored.  Description: No posterior dominant rhythm was seen. EEG showed continuous generalized  3 to 6 Hz theta-delta slowing. Hyperventilation and photic stimulation were not performed.    ABNORMALITY - Continuous slow, generalized  IMPRESSION: This study is suggestive of  moderate to severe diffuse encephalopathy, nonspecific etiology. No seizures or epileptiform discharges were seen throughout the recording.  Priyanka Barbra Sarks    Medications:  sodium chloride     sodium chloride     levETIRAcetam 500 mg (09/02/21 0212)   vancomycin      apixaban  2.5 mg Per Tube BID   Chlorhexidine Gluconate Cloth  6 each Topical Q0600   sevelamer carbonate  1,600 mg Per Tube TID WC   sodium chloride flush  3 mL Intravenous  Q12H    Dialysis Orders: MWF-[facility] obtain outpatient records Plan for patient to discharge to SNF in [place] and resume HD at [facility]currently held  Assessment/Plan: Enterococcus faecalis bacteremia/Aortic valve endocarditis. ID following-plan for 8-week course vancomycin with HD (9/3-10/29/22)-repeat MRI spine in 2 weeks (1st week in October).  Osteomyelitis C5-C6/Discitis L4-L5-ID following, continue ABX Acute Encephalopathy with acute infarct on brain MRI-Neuro following; plan for EEG here and LTM EEG; continue Keppra ESRD - originally on HD MWF but changed to TTS while at [facility]. Noted K+ level of 6.0 and LR infusing at 75cc/hr-LR has been discontinued today d/t risk of hyperkalemia. Due to current HD census, plan for HD this evening.  Hypertension/volume  - Blood pressure elevated. Noted 1+ edema but doesn't appear in acute respiratory distress; HD this evening.  Anemia of CKD - Hgb 9.1-patient on low-dose Epogen at [facility]. Need to obtain outpatient records from [facility]. More likely will resume here.  Secondary Hyperparathyroidism - Ca ok but last PO4 is still high-spoke with Pharmacy-will switch to Renvela powder form. Monitor PO4 trend.  Nutrition - NGT in place; advance to renal diet with restrictions when clinically stable. 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 Reviewed last palliative care note-parents need more time to make complex decisions. Plan for palliative to follow-up with patient's parents 9/24 at 12pm.  Tobie Poet, NP [facility] 09/02/2021,1:22 PM  LOS: 1 day

## 2021-09-02 NOTE — Progress Notes (Signed)
Neurology Progress Note   S:// Patient seen and examined. Overnight LTM-with global slowing-nonspecific etiology.  No seizures or epileptiform discharges seen.   O:// Current vital signs: BP (!) 186/46 (BP Location: Right Leg)   Pulse 90   Temp 99.3 F (37.4 C) (Axillary)   Resp 16   SpO2 100%  Vital signs in last 24 hours: Temp:  [98.2 F (36.8 C)-99.3 F (37.4 C)] 99.3 F (37.4 C) (09/24 1155) Pulse Rate:  [86-91] 90 (09/24 1155) Resp:  [16-19] 16 (09/24 1155) BP: (151-186)/(43-47) 186/46 (09/24 1155) SpO2:  [100 %] 100 % (09/24 1155) General: Sick looking patient in bed HEENT: Normocephalic/atraumatic Lungs: Diminished breath sounds all over CVS: Regular rate rhythm Neurological exam Patient is obtunded Does not open eyes to voice Does not follow commands To noxious stimulation, has localization on both upper extremities stronger on the right with minimal withdrawal to noxious stimulation in bilateral lower extremities. Sensory exam: As above   Medications  Current Facility-Administered Medications:    0.9 %  sodium chloride infusion, 100 mL, Intravenous, PRN, Tobie Poet E, NP   0.9 %  sodium chloride infusion, 100 mL, Intravenous, PRN, Adelfa Koh, NP   acetaminophen (TYLENOL) tablet 650 mg, 650 mg, Per Tube, Q6H PRN **OR** acetaminophen (TYLENOL) suppository 650 mg, 650 mg, Rectal, Q6H PRN, Bertis Ruddy, RPH   albuterol (PROVENTIL) (2.5 MG/3ML) 0.083% nebulizer solution 2.5 mg, 2.5 mg, Nebulization, Q2H PRN, Karmen Bongo, MD   alteplase (CATHFLO ACTIVASE) injection 2 mg, 2 mg, Intracatheter, Once PRN, Adelfa Koh, NP   apixaban Arne Cleveland) tablet 2.5 mg, 2.5 mg, Per Tube, BID, Karmen Bongo, MD, 2.5 mg at 09/02/21 1146   camphor-menthol (SARNA) lotion 1 application, 1 application, Topical, H7W PRN **AND** hydrOXYzine (ATARAX/VISTARIL) tablet 25 mg, 25 mg, Per Tube, Q8H PRN, Bertis Ruddy, RPH   Chlorhexidine Gluconate Cloth 2 %  PADS 6 each, 6 each, Topical, Q0600, Adelfa Koh, NP, 6 each at 09/02/21 0606   docusate sodium (ENEMEEZ) enema 283 mg, 1 enema, Rectal, PRN, Karmen Bongo, MD   heparin injection 1,000 Units, 1,000 Units, Dialysis, PRN, Adelfa Koh, NP   hydrALAZINE (APRESOLINE) injection 5 mg, 5 mg, Intravenous, Q4H PRN, Karmen Bongo, MD   levETIRAcetam (KEPPRA) IVPB 500 mg/100 mL premix, 500 mg, Intravenous, Q12H, Amie Portland, MD, Last Rate: 400 mL/hr at 09/02/21 0212, 500 mg at 09/02/21 2637   lidocaine (PF) (XYLOCAINE) 1 % injection 5 mL, 5 mL, Intradermal, PRN, Adelfa Koh, NP   lidocaine-prilocaine (EMLA) cream 1 application, 1 application, Topical, PRN, Adelfa Koh, NP   ondansetron (ZOFRAN) tablet 4 mg, 4 mg, Per Tube, Q6H PRN **OR** ondansetron (ZOFRAN) injection 4 mg, 4 mg, Intravenous, Q6H PRN, Bertis Ruddy, RPH   pentafluoroprop-tetrafluoroeth (GEBAUERS) aerosol 1 application, 1 application, Topical, PRN, Adelfa Koh, NP   sevelamer carbonate (RENVELA) tablet 1,600 mg, 1,600 mg, Per Tube, TID WC, Karmen Bongo, MD   sodium chloride flush (NS) 0.9 % injection 3 mL, 3 mL, Intravenous, Q12H, Karmen Bongo, MD, 3 mL at 08/13/2021 2227   sorbitol 70 % solution 30 mL, 30 mL, Per Tube, PRN, Bertis Ruddy, [facility] CBC    Component Value Date/Time   WBC 9.5 09/02/2021 0201   RBC 2.89 (L) 09/02/2021 0201   HGB 9.1 (L) 09/02/2021 0201   HGB 9.3 (L) 06/22/2013 1116   HCT 29.4 (L) 09/02/2021 0201   HCT 29.6 (L) 06/22/2013 1116   PLT 94 (L) 09/02/2021 0201   PLT 195  06/22/2013 1116   MCV 101.7 (H) 09/02/2021 0201   MCV 89 06/22/2013 1116   MCH 31.5 09/02/2021 0201   MCHC 31.0 09/02/2021 0201   RDW 23.7 (H) 09/02/2021 0201   RDW 19.3 (H) 06/22/2013 1116   LYMPHSABS 0.6 (L) 08/23/2021 0606   LYMPHSABS 1.2 12/11/2012 0153   MONOABS 1.4 (H) 08/23/2021 0606   MONOABS 0.7 12/11/2012 0153   EOSABS 0.0 08/23/2021 0606   EOSABS 0.2 12/11/2012 0153    BASOSABS 0.0 08/23/2021 0606   BASOSABS 0.0 12/11/2012 0153    CMP     Component Value Date/Time   NA 139 09/02/2021 0201   NA 139 06/22/2013 1116   K 6.0 (H) 09/02/2021 0201   K 3.8 06/22/2013 1116   CL 97 (L) 09/02/2021 0201   CL 101 06/22/2013 1116   CO2 23 09/02/2021 0201   CO2 35 (H) 06/22/2013 1116   GLUCOSE 92 09/02/2021 0201   GLUCOSE 96 06/22/2013 1116   BUN 87 (H) 09/02/2021 0201   BUN 30 (H) 06/22/2013 1116   CREATININE 11.60 (H) 09/02/2021 0201   CREATININE 7.57 (H) 06/22/2013 1116   CALCIUM 9.9 09/02/2021 0201   CALCIUM 7.6 (L) 06/22/2013 1116   PROT 7.2 08/26/2021 0525   PROT 8.6 (H) 06/22/2013 1116   ALBUMIN 2.7 (L) 08/28/2021 0437   ALBUMIN 2.7 (L) 06/22/2013 1116   AST 30 08/26/2021 0525   AST 18 06/22/2013 1116   ALT 19 08/26/2021 0525   ALT 13 06/22/2013 1116   ALKPHOS 72 08/26/2021 0525   ALKPHOS 114 06/22/2013 1116   BILITOT 0.8 08/26/2021 0525   BILITOT 0.4 06/22/2013 1116   GFRNONAA 5 (L) 09/02/2021 0201   GFRNONAA 8 (L) 06/22/2013 1116   GFRAA 3 (L) 12/05/2019 0759   GFRAA 9 (L) 06/22/2013 1116   I have personally reviewed imaging MRA head without contrast-no acute intracranial aneurysm Carotid Dopplers no significant cranial vascular stenosis.   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 Overnight LTM EEG is unremarkable for ongoing seizure activity. Encephalopathy is likely secondary to multiple factors including osteomyelitis, endocarditis as well as cardioembolic strokes that have been likely caused by the infective endocarditis in addition to the bacteremia.  Impression: -Multifactorial toxic metabolic encephalopathy in the  setting of infective endocarditis, bacteremia, osteomyelitis and strokes. -New onset seizures-likely secondary to embolic strokes versus lowering of seizure threshold with acute illness. -Spinal osteomyelitis/discitis and psoas abscess -ESRD -Bacteremia -Jugular vein thrombosis  Recommendations: Patient was transferred over to [facility] for higher level of care and continuous EEG.  Continuous EEG to now has been unremarkable for ongoing seizure activity. I will continue the EEG for 1 more day to make sure were not missing any intermittent seizure activity. His clinical condition currently is consistent with toxic metabolic encephalopathy in the setting of multiple comorbidities as documented above in the impression.  Stroke burden is not huge but the fact that he has endocarditis with embolization to the brain does make him a very sick patient. I think the strokes are contributing some to his clinical condition but most of his current clinical condition is the result of the infections-bacteremia, osteomyelitis and endocarditis as well as severe renal dysfunction. Continue anticoagulation for stroke prevention    I had a detailed phone conversation with his  mother.  She relayed to me that she did not live with him and he would come by once every blue moon.  The last she saw him was 2 days before he was hospitalized.  He was in a lot of pain the day she saw him.  When asked specifically about alcohol or drug usage, she said that he did have a problem with drugs before but he probably was not doing them currently-although she said she could never tell even when he was doing drugs and cannot be sure about that history.  She asked me about his current clinical condition.  I told her that he is currently not awake and not following commands.  His EEG findings were discussed in detail-no seizures noted, slowing noted indicating global cerebral dysfunction.  Given that the EEG has not shown  seizures in 24 hours, I doubt that he is postictal or seizures are playing a major role in his current clinical condition.  I still think that it is a myriad of the conditions listed above that are contributing to his multifactorial toxic metabolic encephalopathy. The mother was thankful of our efforts in taking care of Mr. Sudberry.  She became very tearful and ended the conversation with much appreciation.  She has spoken with the palliative medicine team.  I did not at this point approach the subject of initiating or going towards comfort measure.  My last parting conversation with her was that I am going to have Mr. [place] on 1 more day of EEG and if there are no seizures by tomorrow morning, we are going to discontinue the EEG Goals of care conversation towards comfort measures might be prudent if he continues to remain to be the way he is and does not show any improvement with no seizure activity on EEG.  Plan was discussed with PMT APP via secure chat.  -- Amie Portland, MD Neurologist [facility] Pager: [phone]

## 2021-09-02 NOTE — Progress Notes (Signed)
Palliative Medicine Inpatient Follow Up Note     Chart Reviewed. Patient assessed at the bedside.   Remains lethargic, will not respond to commands or open eyes. Does withdraw to painful stimuli. Continuous EEG in place. Scheduled for HD today. No family at the bedside.   Spoke to patient's mother at length. Updates provided. She is aware patient has not shown any improvement mentally or functionally. We reviewed previous goals of care discussions. Mrs. Sao is quite emotional. Her husband had to leave the home and go assist their daughter however, has expressed he does not want Srikar to suffer.   Mrs. Warman shares memories of patient with concern for his lifestyle choices and poor interest in health maintenance. She shares that family knows he would not want to live in bed the remainder of his days or in a facility.   I reviewed extensively patient's current illness in addition to other co-morbidities.   Mother verbalized understanding. Goals are clear to continue with current plan of care, no escalation, and family will prepare to make final decisions after receiving updates from Neurology. They are not interested in any forms of artificial feedings.   All questions answered and support provided.   1415: Discussed case with Dr. Rory Percy and he has provided extensive updates and plan with Mrs. Bingman. I followed back up with mother for support. She and family are appreciative of the care being provided and updates. They would like to allow an additional 24 hours to see if there are any significant changes. If no improvement family is strongly considering re-focusing on patient's comfort and end-of-life. Education provided on expectations while hospitalized in the setting of comfort focused care. I also approached discussions regarding hospice facility if family chooses to comfort care. Mother verbalized understanding and if hospice facility is required most family resides in [place] and  hospice in [place] would be the location of choice.   Mrs. Lust is aware palliative will continue to support and follow. My colleague Randall Hiss, NP will follow-up with family tomorrow as I will be off service.   Discussed the importance of continued conversation with family and their  medical providers regarding overall plan of care and treatment options, ensuring decisions are within the context of the patients values and GOCs.   Questions addressed and support provided.    Objective Assessment: Vital Signs Vitals:   09/02/21 0034 09/02/21 0413  BP: (!) 180/46 (!) 170/43  Pulse: 89 87  Resp: 19 16  Temp: 98.2 F (36.8 C) 99.1 F (37.3 C)  SpO2: 100% 100%    Intake/Output Summary (Last 24 hours) at 09/02/2021 1119 Last data filed at 09/06/2021 2227 Gross per 24 hour  Intake 444.96 ml  Output --  Net 444.96 ml    Gen:  Lethargic, ill appearing CV: Regular rate and rhythm, no murmurs rubs or gallops PULM: clear to auscultation bilaterally. No wheezes/rales/rhonchi ABD: soft/nontender/nondistended/normal bowel sounds EXT: No edema Neuro: unable to assess mentation, lethargic, does not follow commands, does withdraw to painful stimuli   SUMMARY OF RECOMMENDATIONS   Continue with current plan of care per medical team, Updates provided, family request additional 24 hr monitoring with EEG. If no improvement they would then wish to focus on patient's comfort/EOL. If required hospice home in [place] would be choice location. Parents clear in goals no escalation of care, no artificial feeding tubes.  PMT will continue to support and follow. I will be off service tomorrow however my colleague Randall Hiss, NP will be covering.  Please secure chat for urgent needs.   Discussed with Dr. Rory Percy  Time Total: 65 min.   Visit consisted of counseling and education dealing with the complex and emotionally intense issues of symptom management and palliative care in the setting of serious and potentially  life-threatening illness.Greater than 50%  of this time was spent counseling and coordinating care related to the above assessment and plan.  Alda Lea, AGPCNP-BC  Palliative Medicine Team [phone]  Palliative Medicine Team providers are available by phone from 7am to 7pm daily and can be reached through the team cell phone. Should this patient require assistance outside of these hours, please call the patient's attending physician.

## 2021-09-02 NOTE — Progress Notes (Signed)
Pharmacy Antibiotic Note  [name] [name] is a 60 y.o. male admitted on 08/15/2021 with E.faecalis endocarditis and osteomyelitis.  Pharmacy has been consulted for vancomycin dosing.  Previously on ampicillin and Rocephin.  Patient has a history of MSSA bacteremia in 2014 (no endocarditis), IJ vein thrombosis, admitted to [facility] 8/30 with lower back pain found to have E.faecalis endocarditis, started on appropriate abx, course complicated by seizure, initially transitioned to hospice, but since revered to full medical care (dnr/dni), transferred to [facility] for further management in setting ongoing seizure.  ESRD on TTS HD - to go today.  Afebrile, WBC WNL.  Plan: Vanc 1gm IV qHD TTS Monitor HD schedule/tolerance, vanc level as indicated   Weight: 91 kg (200 lb 9.9 oz)  Temp (24hrs), Avg:98.7 F (37.1 C), Min:98.2 F (36.8 C), Max:99.3 F (37.4 C)  Recent Labs  Lab 08/28/21 0437 08/30/21 0420 09/02/21 0201  WBC 10.6* 9.4 9.5  CREATININE 9.96* 7.61* 11.60*     Estimated Creatinine Clearance: 7.9 mL/min (A) (by C-G formula based on SCr of 11.6 mg/dL (H)).    Allergies  Allergen Reactions   Other     Other reaction(s): Other (See Comments) blister   Tape Other (See Comments)    Plastic Tape Causes blister    Ampicillin 9/3 >> 9/23 Ceftriaxone 9/3 >> 9/23 Vanc 9/23 >>   9/2 BCx - E.faecalis  Takara Sermons D. Mina Marble, PharmD, BCPS, [place] 09/02/2021, 1:17 PM

## 2021-09-03 DIAGNOSIS — Z7189 Other specified counseling: Secondary | ICD-10-CM | POA: Diagnosis not present

## 2021-09-03 DIAGNOSIS — I82C11 Acute embolism and thrombosis of right internal jugular vein: Secondary | ICD-10-CM | POA: Diagnosis not present

## 2021-09-03 DIAGNOSIS — I639 Cerebral infarction, unspecified: Secondary | ICD-10-CM | POA: Diagnosis not present

## 2021-09-03 DIAGNOSIS — I5042 Chronic combined systolic (congestive) and diastolic (congestive) heart failure: Secondary | ICD-10-CM | POA: Diagnosis not present

## 2021-09-03 DIAGNOSIS — Z515 Encounter for palliative care: Secondary | ICD-10-CM | POA: Diagnosis not present

## 2021-09-03 DIAGNOSIS — R569 Unspecified convulsions: Secondary | ICD-10-CM

## 2021-09-03 DIAGNOSIS — Z66 Do not resuscitate: Secondary | ICD-10-CM | POA: Diagnosis not present

## 2021-09-03 DIAGNOSIS — I330 Acute and subacute infective endocarditis: Secondary | ICD-10-CM | POA: Diagnosis not present

## 2021-09-03 LAB — CBC WITH DIFFERENTIAL/PLATELET
Abs Immature Granulocytes: 0.06 10*3/uL (ref 0.00–0.07)
Basophils Absolute: 0 10*3/uL (ref 0.0–0.1)
Basophils Relative: 0 %
Eosinophils Absolute: 0.3 10*3/uL (ref 0.0–0.5)
Eosinophils Relative: 2 %
HCT: 27.4 % — ABNORMAL LOW (ref 39.0–52.0)
Hemoglobin: 8.5 g/dL — ABNORMAL LOW (ref 13.0–17.0)
Immature Granulocytes: 1 %
Lymphocytes Relative: 4 %
Lymphs Abs: 0.4 10*3/uL — ABNORMAL LOW (ref 0.7–4.0)
MCH: 31.4 pg (ref 26.0–34.0)
MCHC: 31 g/dL (ref 30.0–36.0)
MCV: 101.1 fL — ABNORMAL HIGH (ref 80.0–100.0)
Monocytes Absolute: 1 10*3/uL (ref 0.1–1.0)
Monocytes Relative: 10 %
Neutro Abs: 8.7 10*3/uL — ABNORMAL HIGH (ref 1.7–7.7)
Neutrophils Relative %: 83 %
Platelets: 90 10*3/uL — ABNORMAL LOW (ref 150–400)
RBC: 2.71 MIL/uL — ABNORMAL LOW (ref 4.22–5.81)
RDW: 23.9 % — ABNORMAL HIGH (ref 11.5–15.5)
WBC: 10.5 10*3/uL (ref 4.0–10.5)
nRBC: 1 % — ABNORMAL HIGH (ref 0.0–0.2)

## 2021-09-03 LAB — COMPREHENSIVE METABOLIC PANEL
ALT: 14 U/L (ref 0–44)
AST: 24 U/L (ref 15–41)
Albumin: 2 g/dL — ABNORMAL LOW (ref 3.5–5.0)
Alkaline Phosphatase: 59 U/L (ref 38–126)
Anion gap: 15 (ref 5–15)
BUN: 62 mg/dL — ABNORMAL HIGH (ref 6–20)
CO2: 25 mmol/L (ref 22–32)
Calcium: 9.6 mg/dL (ref 8.9–10.3)
Chloride: 98 mmol/L (ref 98–111)
Creatinine, Ser: 9.18 mg/dL — ABNORMAL HIGH (ref 0.61–1.24)
GFR, Estimated: 6 mL/min — ABNORMAL LOW (ref >60)
Glucose, Bld: 55 mg/dL — ABNORMAL LOW (ref 70–99)
Potassium: 4.5 mmol/L (ref 3.5–5.1)
Sodium: 138 mmol/L (ref 135–145)
Total Bilirubin: 1.6 mg/dL — ABNORMAL HIGH (ref 0.3–1.2)
Total Protein: 6.6 g/dL (ref 6.5–8.1)

## 2021-09-03 LAB — GLUCOSE, CAPILLARY
Glucose-Capillary: 88 mg/dL (ref 70–99)
Glucose-Capillary: 90 mg/dL (ref 70–99)

## 2021-09-03 MED ORDER — NEPRO/CARBSTEADY PO LIQD
1000.0000 mL | ORAL | Status: DC
  Filled 2021-09-03: qty 1000

## 2021-09-03 MED ORDER — NEPRO/CARBSTEADY PO LIQD
1000.0000 mL | ORAL | Status: DC
  Filled 2021-09-03 (×2): qty 1000

## 2021-09-03 MED ORDER — PROSOURCE TF PO LIQD: 45.0000 mL | Freq: Every day | ORAL | Status: DC

## 2021-09-03 NOTE — Evaluation (Addendum)
Clinical/Bedside Swallow Evaluation Patient Details  Name: [name] [name] MRN: [id number] Date of Birth: [birth year]  Today's Date: 09/03/2021 Time: SLP Start Time (ACUTE ONLY): 1120 SLP Stop Time (ACUTE ONLY): 1140 SLP Time Calculation (min) (ACUTE ONLY): 20 min  Past Medical History:  Past Medical History:  Diagnosis Date   Anemia of chronic disease    BL Hgb ~10   CHF (congestive heart failure) ([place])    pt denies 04/01/14   Diverticulitis    End-stage renal disease on hemodialysis ([place])    HD m/w/f - right arm graft   History of blood transfusion    Hypertension    Pneumonia    hx of   Pneumonia    Polysubstance abuse ([place])    Renal insufficiency    Secondary hyperparathyroidism ([place])    Sleep disturbance    Tobacco abuse    Past Surgical History:  Past Surgical History:  Procedure Laterality Date   APPENDECTOMY     CHEST TUBE INSERTION  02/2013   COLONOSCOPY  12/16/2012   Procedure: COLONOSCOPY;  Surgeon: Milus Banister, MD;  Location: [place];  Service: Endoscopy;  Laterality: N/A;   IR FLUORO GUIDE CV LINE LEFT  08/18/2021   IR FLUORO GUIDE CV LINE LEFT  08/29/2021   IR US GUIDANCE  08/18/2021   PLEURAL EFFUSION DRAINAGE  12/18/2012   Procedure: DRAINAGE OF PLEURAL EFFUSION;  Surgeon: Melrose Nakayama, MD;  Location: [place];  Service: Thoracic;  Laterality: Right;   surgery for horseshoe kidney     TALC PLEURODESIS Right 02/05/2013   Procedure: Pietro Cassis;  Surgeon: Melrose Nakayama, MD;  Location: [place];  Service: Thoracic;  Laterality: Right;   TEE WITHOUT CARDIOVERSION N/A 01/16/2013   Procedure: TRANSESOPHAGEAL ECHOCARDIOGRAM (TEE);  Surgeon: Sanda Klein, MD;  Location: [facility] ENDOSCOPY;  Service: Cardiovascular;  Laterality: N/A;   TEE WITHOUT CARDIOVERSION Right 08/16/2021   Procedure: TRANSESOPHAGEAL ECHOCARDIOGRAM (TEE);  Surgeon: Dionisio David, MD;  Location: [facility] ORS;  Service: Cardiovascular;  Laterality: Right;   VIDEO ASSISTED THORACOSCOPY   12/18/2012   Procedure: VIDEO ASSISTED THORACOSCOPY;  Surgeon: Melrose Nakayama, MD;  Location: [place];  Service: Thoracic;  Laterality: Right;  pleural peel   VIDEO ASSISTED THORACOSCOPY (VATS)/THOROCOTOMY Right 02/19/2013   Procedure: VIDEO ASSISTED THORACOSCOPY (VATS)/THOROCOTOMY;  Surgeon: Melrose Nakayama, MD;  Location: [place];  Service: Thoracic;  Laterality: Right;  Right Video Assisted Thoracoscopy, Right Thoracotomy, Decortication,    VIDEO BRONCHOSCOPY WITH INSERTION OF INTERBRONCHIAL VALVE (IBV)  01/09/2013   Procedure: VIDEO BRONCHOSCOPY WITH INSERTION OF INTERBRONCHIAL VALVE (IBV);  Surgeon: Melrose Nakayama, MD;  Location: [place];  Service: Thoracic;  Laterality: N/A;  placement of 2 Intrabronchial valves in right lower lobe   VIDEO BRONCHOSCOPY WITH INSERTION OF INTERBRONCHIAL VALVE (IBV) Right 02/19/2013   Procedure: VIDEO BRONCHOSCOPY WITH INSERTION OF INTERBRONCHIAL VALVE (IBV);  Surgeon: Melrose Nakayama, MD;  Location: [place];  Service: Thoracic;  Laterality: Right;  removal of two interbronchial valves right chest   HPI:  Patient is a 60 y.o. male with PMH: ESRD on TTS HD; HTN; polysubstance abuse; jugular vein thrombosis; and current sepsis due to enterococcus bacteremia with ?discitis in L4-5 and osteo in C5-6 with L psoas muscle abscess formation as well as aortic valve endocarditis, encephalopathy. He was initially admitted to [facility] on 8/30 with back pain but his neurologic status remained poor and so his parents made him DNR with comfort care status, but neurology recommended seizure medications based  on EEG and he had some improvement neurologically and so they changed his status and he was transferred to [facility] for further neurologic evaluation on 9/23.    Assessment / Plan / Recommendation  Clinical Impression  Patient presents with what appears to be a cognitive-based dysphagia. SLP observed clinicial s/s oral phase dysphagia and questionable pharyngeal phase  dysphagia. Patient was confused and easily distracted but did accept several straw sips of thin liquids and 2-3 small bites puree (applesauce). No overt s/s aspiration or penetration observed. Patient did require cues and sips of liquids at times to clear trace to minimal oral residuals in anterior portion of oral cavity. Oral holding was intermittent and appeared due to patient becoming distracted so that awareness to PO's was reduced. SLP is recommending to initiate Dys 1 solids, thin liquids diet at this time. SLP Visit Diagnosis: Dysphagia, unspecified (R13.10)    Aspiration Risk  Mild aspiration risk;Moderate aspiration risk    Diet Recommendation Dysphagia 1 (Puree);Thin liquid   Liquid Administration via: Cup;Straw Medication Administration: Crushed with puree Supervision: Full supervision/cueing for compensatory strategies;Staff to assist with self feeding Compensations: Minimize environmental distractions;Slow rate;Small sips/bites Postural Changes: Seated upright at 90 degrees    Other  Recommendations Oral Care Recommendations: Oral care BID;Staff/trained caregiver to provide oral care    Recommendations for follow up therapy are one component of a multi-disciplinary discharge planning process, led by the attending physician.  Recommendations may be updated based on patient status, additional functional criteria and insurance authorization.  Follow up Recommendations Other (comment) (TBD)      Frequency and Duration min 1 x/week  1 week       Prognosis Prognosis for Safe Diet Advancement: Fair Barriers to Reach Goals: Cognitive deficits;Time post onset      Swallow Study   General Date of Onset: 08/24/2021 HPI: Patient is a 60 y.o. male with PMH: ESRD on TTS HD; HTN; polysubstance abuse; jugular vein thrombosis; and current sepsis due to enterococcus bacteremia with ?discitis in L4-5 and osteo in C5-6 with L psoas muscle abscess formation as well as aortic valve  endocarditis, encephalopathy. He was initially admitted to [facility] on 8/30 with back pain but his neurologic status remained poor and so his parents made him DNR with comfort care status, but neurology recommended seizure medications based on EEG and he had some improvement neurologically and so they changed his status and he was transferred to [facility] for further neurologic evaluation on 9/23. Type of Study: Bedside Swallow Evaluation Previous Swallow Assessment: None found Diet Prior to this Study: NPO Temperature Spikes Noted: No Respiratory Status: Room air History of Recent Intubation: No Behavior/Cognition: Alert;Distractible;Requires cueing;Doesn't follow directions Oral Cavity Assessment: Within Functional Limits Oral Care Completed by SLP: Other (Comment) (minimal amount of oral care secondary to patient not fully coooperative) Oral Cavity - Dentition: Adequate natural dentition Self-Feeding Abilities: Total assist Patient Positioning: Upright in bed Baseline Vocal Quality: Normal Volitional Cough: Cognitively unable to elicit Volitional Swallow: Unable to elicit    Oral/Motor/Sensory Function Overall Oral Motor/Sensory Function: Other (comment) (limited oral motor assessment secondary to patient participation but no significant impairments observed)   Ice Chips     Thin Liquid Thin Liquid: Impaired Presentation: Straw Pharyngeal  Phase Impairments: Suspected delayed Swallow    Nectar Thick     Honey Thick     Puree Puree: Impaired Oral Phase Functional Implications: Prolonged oral transit;Oral holding Pharyngeal Phase Impairments: Suspected delayed Swallow   Solid     Solid: Not  tested Oral Phase Impairments: Impaired mastication Oral Phase Functional Implications: Prolonged oral transit;Impaired mastication;Oral residue      Sonia Baller, MA, CCC-SLP Speech Therapy

## 2021-09-03 NOTE — Progress Notes (Addendum)
PROGRESS NOTE  [name] [name] KYH:[id number] DOB: [birth year] DOA: 08/17/2021 PCP: Pcp, No  Brief History   The patient is a 60 year old man who was transferred to [facility] on 09/04/2021 for higher level of care and continuous EEG monitoring. The patient himself is unable to provide a history. [name] [name] is a 60 y.o. 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  He was transferred to [facility] from [facility] today after presenting there on 8/30 with back pain.   His neurologic status remained poor and so his parents made him DNR with comfort care status, but neurology recommended seizure medications based on EEG and he had some improvement neurologically and so they changed his status and he was transferred to [facility] for further neurologic evaluation.  He is currently responsive only to pain, with withdrawing and moans and nonsensical speech.  The patient was on EEG for most of 09/02/2021. He underwent HD on that afternoon. After he returned to his room on 09/02/2021, the patient had improved level of alertness. He was oriented to self and is able to follow simple commands. Family is aware.  SLP has been asked to evaluate the patient for swallow recommendations.  Consultants  Neurology Palliative Care Nephrology  Procedures  ESRD EEG  Antibiotics   Anti-infectives (From admission, onward)    Start     Dose/Rate Route Frequency Ordered Stop   09/02/21 1415  vancomycin (VANCOCIN) IVPB 1000 mg/200 mL premix        1,000 mg 200 mL/hr over 60 Minutes Intravenous Every T-Th-Sa (Hemodialysis) 09/02/21 1318     08/21/2021 1645  vancomycin (VANCOCIN) IVPB 1000 mg/200 mL premix        1,000 mg 200 mL/hr over 60 Minutes Intravenous  Once  08/22/2021 1550 08/23/2021 1711   08/16/2021 1200  ampicillin  Status:  Discontinued       Note to Pharmacy: Indication:  E faecalis Bacteremia, lumbar discitis/endocarditis  Last Day of Therapy:  09/23/21 Labs - Weekly CBC w diff, CMP  Line removed at the end of treatment Fax weekly labs to  Dr.Ravishankar [phone]   2 g Intravenous Every 12 hours 08/28/2021 1101 08/29/2021 1110   09/04/2021 1200  cefTRIAXone (ROCEPHIN) IVPB  Status:  Discontinued       Note to Pharmacy: Indication:  E faecalis Bacteremia, lumbar discitis/endocarditis  Last Day of Therapy:  09/23/21 Labs - Weekly CBC w diff, CMP  Line removed at the end of treatment Fax weekly labs to  Dr.Ravishankar [phone]   2 g Intravenous Every 12 hours 08/14/2021 1101 08/12/2021 1110   08/29/2021 1200  ampicillin (OMNIPEN) 2 g in sodium chloride 0.9 % 100 mL IVPB  Status:  Discontinued        2 g 300 mL/hr over 20 Minutes Intravenous Every 12 hours 08/31/2021 1110 08/31/2021 1531   08/25/2021 1200  cefTRIAXone (ROCEPHIN) 2 g in sodium chloride 0.9 % 100 mL IVPB  Status:  Discontinued        2 g 200 mL/hr over 30 Minutes Intravenous Every 12 hours 09/03/2021 1110 09/07/2021 1531      Subjective  The patient is lying in bed with family at bedside. He is awake and responsive. Moving all extremities. Communicative, although  dysarthric.   Objective   Vitals:  Vitals:   09/03/21 1156 09/03/21 1520  BP: (!) 147/73 115/60  Pulse: 96 99  Resp: 20 18  Temp: 98 F (36.7 C) 98.6 F (37 C)  SpO2: 100% 100%    Exam:  Constitutional:  The patient is lying with his eyes closed. He is not verbally responsive. He does move his right arm as I am auscultating his chest. Eyes are sharply deviated to the right.  Eyes:  pupils and irises appear normal EOMBI- intact Normal lids and conjunctivae Respiratory:  CTA bilaterally, no w/r/r.  Respiratory effort normal. No retractions or accessory muscle use Cardiovascular:  RRR, no m/r/g No LE  extremity edema   Normal pedal pulses Abdomen:  Abdomen appears normal; no tenderness or masses No hernias No HSM Musculoskeletal:  Digits/nails BUE: no clubbing, cyanosis, petechiae, infection exam of joints, bones, muscles of at least one of following: head/neck, RUE, LUE, RLE, LLE   Skin:  No rashes, lesions, ulcers palpation of skin: no induration or nodules Neurologic:  Patient is moving all extremities Improved level of alertness and interactivity Dysarthric. CN II - XII grossly intact Psychiatric:  Unable to evaluate  I have personally reviewed the following:   Today's Data  Vitals  Lab Data  CBC, CMP  Micro Data  Blood culture X2 positive for growth of E. Faecalis on 08/11/2021. Surveillance cultures from 08/12/2021  Imaging  Intracranial MRA  Cardiology Data  TEE EKG  Scheduled Meds:  apixaban  2.5 mg Per Tube BID   Chlorhexidine Gluconate Cloth  6 each Topical Q0600   feeding supplement (NEPRO CARB STEADY)  1,000 mL Per Tube Q24H   feeding supplement (PROSource TF)  45 mL Per Tube Daily   sevelamer carbonate  2.4 g Per Tube TID WC   sodium chloride flush  3 mL Intravenous Q12H   Continuous Infusions:  levETIRAcetam 500 mg (09/03/21 1441)   vancomycin 1,000 mg (09/02/21 1721)    Principal Problem:   Endocarditis Active Problems:   End-stage renal disease on hemodialysis (HCC)   Polysubstance abuse ([place])   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 Follow up appointment 10/13 with ID.  - scopolamine patch for oral  secretions   osteomyelitis in C5-C6, discitis in L4-L5 Left psoas muscle infection with superimposed 1.3 cm phlegmon/early abscess formation -Continue antibiotics and recommendations from ID.  Continue pain management and bowel regimen   Dysarthria:  SLP consulted to evaluate the patient for safe strategies and consistencies of food.  Acute encephalopathy with Acute infarct on brain MRI- Improved with improved level of alertness and mobility. - management and further workup of seizure contribution management per neurology - phenytoin level in process   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 -Eliquis 2.5 mg twice daily as per Dr. Keturah Barre recommendations.      End-stage renal disease on hemodialysis -Nephrology following.  Dialysis as per nephrology schedule -Dialysis schedule changed from M WF to TTS   Anemia of chronic disease -From renal failure - follow with dialysis labs  H/o Hypertension. Currently Hypotension -Blood pressure currently stable.  Off amlodipine  Chronic combined CHF -LVEF of 30 to 35% as per TTE on 08/13/2021.  Volume managed by dialysis.  Strict input output.  Daily weights.  Fluid restriction  Depression- stable   Generalized conditioning -PT/OT recommend. Social worker following.   Goals of care -continue family/palliative conversations   DVT prophylaxis: apixaban (ELIQUIS) tablet 2.5 mg Start: 08/30/21 2200 apixaban (ELIQUIS) tablet 2.5 mg  Code Status: DNR Family Communications: None available   Cybil Senegal, DO [facility] Direct contact: see [email or web address]  7PM-7AM contact night coverage as above  09/03/2021, 4:19 PM  LOS: 1 day

## 2021-09-03 NOTE — Procedures (Signed)
Patient Name: [name] [name]  MRN: [id number]  Epilepsy Attending: Lora Havens  Referring Physician/Provider: Dr Amie Portland Duration: 09/02/2021 1155 to 09/03/2021 0907   Patient history: 60 yo M with waxing and waning mental status. EEG to evaluate for seizure   Level of alertness: lethargic   AEDs during EEG study: None   Technical aspects: This EEG study was done with scalp electrodes positioned according to the 10-20 International system of electrode placement. Electrical activity was acquired at a sampling rate of 500Hz  and reviewed with a high frequency filter of 70Hz  and a low frequency filter of 1Hz . EEG data were recorded continuously and digitally stored.    Description: No posterior dominant rhythm was seen. EEG showed continuous generalized 3 to 6 Hz theta-delta slowing. Hyperventilation and photic stimulation were not performed.      ABNORMALITY - Continuous slow, generalized   IMPRESSION: This study is suggestive of  moderate to severe diffuse encephalopathy, nonspecific etiology. No seizures or epileptiform discharges were seen throughout the recording.   Jaylena Holloway Barbra Sarks

## 2021-09-03 NOTE — Progress Notes (Addendum)
Neurology Progress Note   S:// Patient seen and examined. He is awake and following commands today.   O:// Current vital signs: BP (!) 175/46   Pulse 92   Temp 98.4 F (36.9 C) (Oral)   Resp (!) 25   Wt 89 kg   SpO2 100%   BMI 25.89 kg/m  Vital signs in last 24 hours: Temp:  [98.4 F (36.9 C)-100.4 F (38 C)] 98.4 F (36.9 C) (09/25 0655) Pulse Rate:  [82-98] 92 (09/25 0655) Resp:  [12-25] 25 (09/25 0655) BP: (139-196)/(38-52) 175/46 (09/25 0655) SpO2:  [100 %] 100 % (09/25 0655) Weight:  [89 kg-91 kg] 89 kg (09/24 1830) General: Sick looking patient in bed HEENT: Normocephalic/atraumatic Lungs: Diminished breath sounds all over CVS: Regular rate rhythm Neurological exam Awake alert and oriented to self Extremely dysarthric Extremely poor attention concentration Following simple commands Cranials: Pupils equal round react light, extraocular movements intact, visual fields full, face appears symmetric, tongue and palate midline Motor exam: Moves all 4 extremities antigravity-is generally weak all over but there is no focality. Sensation intact light touch all over Coordination: Difficult to perform given his mentation and limited command following ability and encephalopathy   Medications  Current Facility-Administered Medications:    acetaminophen (TYLENOL) tablet 650 mg, 650 mg, Per Tube, Q6H PRN **OR** acetaminophen (TYLENOL) suppository 650 mg, 650 mg, Rectal, Q6H PRN, Bertis Ruddy, RPH   albuterol (PROVENTIL) (2.5 MG/3ML) 0.083% nebulizer solution 2.5 mg, 2.5 mg, Nebulization, Q2H PRN, Karmen Bongo, MD   apixaban Arne [place]) tablet 2.5 mg, 2.5 mg, Per Tube, BID, Karmen Bongo, MD, 2.5 mg at 09/02/21 2221   camphor-menthol (SARNA) lotion 1 application, 1 application, Topical, W1U PRN **AND** hydrOXYzine (ATARAX/VISTARIL) tablet 25 mg, 25 mg, Per Tube, Q8H PRN, Bertis Ruddy, RPH   Chlorhexidine Gluconate Cloth 2 % PADS 6 each, 6 each, Topical, Q0600,  Adelfa Koh, NP, 6 each at 09/03/21 0545   docusate sodium (ENEMEEZ) enema 283 mg, 1 enema, Rectal, PRN, Karmen Bongo, MD   feeding supplement (NEPRO CARB STEADY) liquid 1,000 mL, 1,000 mL, Per Tube, Q24H, Swayze, Ava, DO   feeding supplement (PROSource TF) liquid 45 mL, 45 mL, Per Tube, Daily, Swayze, Ava, DO   hydrALAZINE (APRESOLINE) injection 5 mg, 5 mg, Intravenous, Q4H PRN, Karmen Bongo, MD   levETIRAcetam (KEPPRA) IVPB 500 mg/100 mL premix, 500 mg, Intravenous, Q12H, Amie Portland, MD, Last Rate: 400 mL/hr at 09/03/21 0320, 500 mg at 09/03/21 0320   ondansetron (ZOFRAN) tablet 4 mg, 4 mg, Per Tube, Q6H PRN **OR** ondansetron (ZOFRAN) injection 4 mg, 4 mg, Intravenous, Q6H PRN, Bertis Ruddy, RPH   sevelamer carbonate (RENVELA) powder PACK 2.4 g, 2.4 g, Per Tube, TID WC, Tobie Poet E, NP   sodium chloride flush (NS) 0.9 % injection 3 mL, 3 mL, Intravenous, Q12H, Karmen Bongo, MD, 3 mL at 09/03/21 0818   sorbitol 70 % solution 30 mL, 30 mL, Per Tube, PRN, Bertis Ruddy, RPH   vancomycin (VANCOCIN) IVPB 1000 mg/200 mL premix, 1,000 mg, Intravenous, Q T,Th,Sa-HD, Dang, Thuy D, RPH, Last Rate: 200 mL/hr at 09/02/21 1721, 1,000 mg at 09/02/21 1721 Labs CBC    Component Value Date/Time   WBC 9.5 09/02/2021 0201   RBC 2.89 (L) 09/02/2021 0201   HGB 9.1 (L) 09/02/2021 0201   HGB 9.3 (L) 06/22/2013 1116   HCT 29.4 (L) 09/02/2021 0201   HCT 29.6 (L) 06/22/2013 1116   PLT 94 (L) 09/02/2021 0201   PLT 195 06/22/2013 1116  MCV 101.7 (H) 09/02/2021 0201   MCV 89 06/22/2013 1116   MCH 31.5 09/02/2021 0201   MCHC 31.0 09/02/2021 0201   RDW 23.7 (H) 09/02/2021 0201   RDW 19.3 (H) 06/22/2013 1116   LYMPHSABS 0.6 (L) 08/23/2021 0606   LYMPHSABS 1.2 12/11/2012 0153   MONOABS 1.4 (H) 08/23/2021 0606   MONOABS 0.7 12/11/2012 0153   EOSABS 0.0 08/23/2021 0606   EOSABS 0.2 12/11/2012 0153   BASOSABS 0.0 08/23/2021 0606   BASOSABS 0.0 12/11/2012 0153    CMP      Component Value Date/Time   NA 139 09/02/2021 0201   NA 139 06/22/2013 1116   K 6.0 (H) 09/02/2021 0201   K 3.8 06/22/2013 1116   CL 97 (L) 09/02/2021 0201   CL 101 06/22/2013 1116   CO2 23 09/02/2021 0201   CO2 35 (H) 06/22/2013 1116   GLUCOSE 92 09/02/2021 0201   GLUCOSE 96 06/22/2013 1116   BUN 87 (H) 09/02/2021 0201   BUN 30 (H) 06/22/2013 1116   CREATININE 11.60 (H) 09/02/2021 0201   CREATININE 7.57 (H) 06/22/2013 1116   CALCIUM 9.9 09/02/2021 0201   CALCIUM 7.6 (L) 06/22/2013 1116   PROT 7.2 08/26/2021 0525   PROT 8.6 (H) 06/22/2013 1116   ALBUMIN 2.7 (L) 08/28/2021 0437   ALBUMIN 2.7 (L) 06/22/2013 1116   AST 30 08/26/2021 0525   AST 18 06/22/2013 1116   ALT 19 08/26/2021 0525   ALT 13 06/22/2013 1116   ALKPHOS 72 08/26/2021 0525   ALKPHOS 114 06/22/2013 1116   BILITOT 0.8 08/26/2021 0525   BILITOT 0.4 06/22/2013 1116   GFRNONAA 5 (L) 09/02/2021 0201   GFRNONAA 8 (L) 06/22/2013 1116   GFRAA 3 (L) 12/05/2019 0759   GFRAA 9 (L) 06/22/2013 1116   I have personally reviewed imaging MRA head without contrast-no acute intracranial aneurysm Carotid Dopplers no significant cranial vascular stenosis.  LTM overnight: IMPRESSION: This study is suggestive of  moderate to severe diffuse encephalopathy, nonspecific etiology. No seizures or epileptiform discharges were seen throughout the recording.     Assessment:  60 year old man, past history of ESRD on HD, hypertension, tobacco abuse, polysubstance abuse, diverticulitis, anemia, jugular vein thrombosis on anticoagulation, spinal osteomyelitis, Enterococcus faecalis bacteremia, aortic valve endocarditis, CHF (systolic), scattered embolic looking infarctions and seizures,-transfer for continuous EEG and higher level of care. Overnight LTM EEG is unremarkable for ongoing seizure activity. Exam much improved today with antibiotic treatment and supportive treatment proving provided by the primary team-she is awake, oriented to  self, and following simple commands. Encephalopathy is likely secondary to multiple factors including osteomyelitis, endocarditis as well as cardioembolic strokes that have been likely caused by the infective endocarditis in addition to the bacteremia.  Impression: -Multifactorial toxic metabolic encephalopathy in the setting of infective endocarditis, bacteremia, osteomyelitis and strokes. -New onset seizures-likely secondary to embolic strokes versus lowering of seizure threshold with acute illness. -Spinal osteomyelitis/discitis and psoas abscess -ESRD -Bacteremia -Jugular vein thrombosis  Recommendations:  TOXIC METABOLIC ENCEPHALOPATHY Continue aggressive management of the bacteremia, osteomyelitis, and endocarditis per primary team and ID as you are. Given the significant improvement seen today, I would continue to treat him without considering comfort measures yet as he has shown significant improvement in the last 24 hours to the point where from being completely encephalopathic and unresponsive he is following simple commands.   As for his seizure activity: He had cortical dysfunction arising from the right hemisphere but no seizures were ever caught.  The EEG  findings and the seizures were probably provoked due to the ongoing toxic metabolic derangements.  Given the LTM EEG has not shown seizures in the last 48 hours, we will go ahead and discontinue that.  I met son, Birder Robson at bedside, who has seen his father for the first time in many days.  I discussed my care plan with him in detail and answered all his questions.  Prior discussions with the palliative medicine team regarding limitations of treatment and goals of care are appropriate to be scaled back to the point where we should give him time to improve with the treatment of the underlying multiple infections.    He is still encephalopathic but much more awake than he has been in the past few days and I do not think  that seizure was the reason alone for the encephalopathy-EEG  only showed focal slowing indicating that there might have been some abnormalities in those areas-but rather his encephalopathy is a combination of multifactorial etiologies-infection in the bone, heart and bacteremia as well as all the toxic metabolic derangements that he has with his ESRD and pre-existing conditions.  He still might have off-and-on changes in his mentation, and that would require prolonged time to improve if it does given that all the other comorbidities are also very severe at this point.  EMBOLIC STROKES - LIKELY FROM SEPTIC EMBOLI FROM ENDOCARDITIS Echo with evidence of endocarditis, likely source of strokes A1c at goal - 4.3 (gaol <7) LDL 75.1 - goal LDL <70  No evidence of Afib Head vessel imaging neg for LVO or mycotic aneurysms.  OK to c/w DOAC (for IJ thrombus) -which would also be    Plan was discussed with PMT APP and primary hospitalist via secure chat.  -- Amie Portland, MD Neurologist [facility] Pager: [phone]

## 2021-09-03 NOTE — Progress Notes (Signed)
Daily Progress Note   Patient Name: [name] [name]       Date: 09/03/2021 DOB: Oct 20, 1961  Age: 60 y.o. MRN#: [id number] Attending Physician: [name] Kirks, DO Primary Care Physician: Pcp, No Admit Date: 08/31/2021 Length of Stay: 2 days  Reason for Consultation/Follow-up: Establishing goals of care  HPI/Patient Profile:  60 y.o. male  with past medical history of ESRD on HD MWF, hypertension, tobacco abuse, polysubstance abuse, diverticulitis, anemia, jugular vein thrombosis, GI bleeding, right pneumothorax admitted on 08/08/2021 with lower back pain and found bacteremia, endocarditis, discitis. Prolonged hospitalization complicated by ongoing encephalopathy with fluctuating agitation and lethargy. Treated for potential Wernike's encephalopathy. MRI with possible small acute infarcts. Concern for potential seizure activity.   He was transferred to [facility] for further EEG. Overall, EEG indicates likely toxic metabolic encephalopathy from multiple infections (infective endocarditis, bacteremia, osteomyelitis, and strokes). Discussions with family and palliative given patient essentially unresponsive and were considering going back to comfort care and/or hospice. Awaiting further information from neuro to guide decision making.  PMT was consulted for [place] discussions.  Subjective:   Subjective: Chart Reviewed. Updates received. Patient Assessed. Created space and opportunity for patient  and family to explore thoughts and feelings regarding current medical situation.  Today's Discussion: I met the patient and his friend [name] [name]) and her daughter at the bedside.   Review of Systems  Unable to perform ROS: Mental status change  Constitutional:        Seems to deny pain in general   Objective:   Vital Signs:  BP (!) 175/46   Pulse 92   Temp 98.4 F (36.9 C) (Oral)   Resp (!) 25   Wt 89 kg   SpO2 100%   BMI 25.89 kg/m   Physical Exam: Physical Exam Vitals and nursing note reviewed.   Constitutional:      General: He is not in acute distress.    Appearance: He is ill-appearing.     Comments: Hand mitts in place  HENT:     Head: Normocephalic and atraumatic.  Cardiovascular:     Rate and Rhythm: Normal rate and regular rhythm.  Pulmonary:     Effort: Pulmonary effort is normal. No respiratory distress.     Breath sounds: No wheezing or rhonchi.  Abdominal:     General: Abdomen is flat.     Palpations: Abdomen is soft.     Tenderness: There is no abdominal tenderness.  Skin:    General: Skin is warm and dry.  Neurological:     Mental Status: He is alert. He is disoriented.     Motor: Weakness present.     Comments: Oriented x 2 (knows name and he's in [place]). Is moving all extremities, follows simple commands    Palliative Assessment/Data: 20%   Assessment & Plan:   Impression: Present on Admission:  Endocarditis  Polysubstance abuse ([place])  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 (HCC)  Chronic combined systolic and diastolic CHF (congestive heart failure) ([place])  DNR (do not resuscitate)  Today the patient has had a significant change in that he is more awake, answering questions (though delayed) and was asking nurse to eat. SLP consult placed for swallow evaluation. On my exam he is tracking, following simple commands (wiggle your toes). Seems to be trying to have more substantial verbalizations; seemed to deny pain in general. Trying to speak some. Overall noted significant change. Spoke with neurology who feels given his presentation likely toxic encephalopathy from multiple infections  and ESRD, he may have had some improvement with antibiotics and HD.  Attempted to call the patient's parents and update them, but no answer. Left a message for callback.  SUMMARY OF RECOMMENDATIONS   Continue to treat the treatable Cautious optimism as there's a lot still going on medically Take one day at a time Continue further discussions as he  progresses clinically Given how sick he still is, recommend continued DNR for now; will discuss with family further  Code Status: DNR  Prognosis: Unable to determine  Discharge Planning: To Be Determined  Discussed with: [name] [name], Dr. Rory [name], Nursing staff, patient loved ones  Thank you for allowing [place] to participate in the care of [name] [name] PMT will continue to support holistically.  Time Total: 45 min  Visit consisted of counseling and education dealing with the complex and emotionally intense issues of symptom management and palliative care in the setting of serious and potentially life-threatening illness. Greater than 50%  of this time was spent counseling and coordinating care related to the above assessment and plan.  [name] Field, NP Palliative Medicine Team  Team Phone # [phone] (Nights/Weekends)  08/08/2021, 8:17 AM

## 2021-09-03 NOTE — Progress Notes (Signed)
[facility] Progress Note   Subjective:    Patient seen and examined at bedside. Tolerated yesterday's HD with net UF 2L. Opens eyes to voice and follows simple questions (yes/no). Plan for HD 9/27.  Objective Vitals:   09/03/21 0355 09/03/21 0455 09/03/21 0555 09/03/21 0655  BP: (!) 181/46 (!) 182/47 (!) 156/43 (!) 175/46  Pulse: 90 92 88 92  Resp: (!) 22 17 20  (!) 25  Temp:    98.4 F (36.9 C)  TempSrc:    Oral  SpO2: 100% 100% 100% 100%  Weight:       Physical Exam General: Open eyes to voice, responded to simple question (yes/no); on O2 [place]; NAD; NGT R nare Lungs: CTA bilaterally. No wheeze, rales or rhonchi. Breathing is unlabored. Heart: RRR. No murmur, rubs or gallops.  Abdomen: soft, nontender, active bowel sounds Lower extremities: 1+ edema BLUE and L hip; no edema BLLE Neuro: Opens eyes to voice and responded to simple question (yes/no) Dialysis Access: R AVG Hero (+) Bruit/Thrill  Filed Weights   09/02/21 1300 09/02/21 1546 09/02/21 1830  Weight: 91 kg 91 kg 89 kg    Intake/Output Summary (Last 24 hours) at 09/03/2021 1052 Last data filed at 09/02/2021 1830 Gross per 24 hour  Intake 297.86 ml  Output 2000 ml  Net -1702.14 ml    Additional Objective Labs: Basic Metabolic Panel: Recent Labs  Lab 08/28/21 0437 08/30/21 0420 09/02/21 0201 09/03/21 0815  NA 138 139 139 138  K 4.6 4.6 6.0* 4.5  CL 97* 97* 97* 98  CO2 23 27 23 25   GLUCOSE 85 79 92 55*  BUN 71* 56* 87* 62*  CREATININE 9.96* 7.61* 11.60* 9.18*  CALCIUM 10.2 9.3 9.9 9.6  PHOS 7.8*  --  9.4*  --    Liver Function Tests: Recent Labs  Lab 08/28/21 0437 09/03/21 0815  AST  --  24  ALT  --  14  ALKPHOS  --  59  BILITOT  --  1.6*  PROT  --  6.6  ALBUMIN 2.7* 2.0*   No results for input(s): LIPASE, AMYLASE in the last 168 hours. CBC: Recent Labs  Lab 08/28/21 0437 08/30/21 0420 09/02/21 0201 09/03/21 0815  WBC 10.6* 9.4 9.5 10.5  NEUTROABS  --   --   --  8.7*   HGB 9.8* 9.5* 9.1* 8.5*  HCT 31.1* 30.8* 29.4* 27.4*  MCV 100.0 101.0* 101.7* 101.1*  PLT 137* 117* 94* 90*   Blood Culture    Component Value Date/Time   SDES BLOOD BLOOD LEFT HAND 08/12/2021 2358   SDES BLOOD LEFT RADIAL 08/12/2021 2358   SPECREQUEST  08/12/2021 2358    BOTTLES DRAWN AEROBIC AND ANAEROBIC Blood Culture adequate volume   SPECREQUEST  08/12/2021 2358    BOTTLES DRAWN AEROBIC AND ANAEROBIC Blood Culture results may not be optimal due to an inadequate volume of blood received in culture bottles   CULT  08/12/2021 2358    NO GROWTH 5 DAYS Performed at [facility], [address] [place], [place] [address]    CULT  08/12/2021 2358    NO GROWTH 5 DAYS Performed at [facility], [address] [place], [place] [address]    REPTSTATUS 08/18/2021 FINAL 08/12/2021 2358   REPTSTATUS 08/18/2021 FINAL 08/12/2021 2358    Cardiac Enzymes: No results for input(s): CKTOTAL, CKMB, CKMBINDEX, TROPONINI in the last 168 hours. CBG: Recent Labs  Lab 08/18/2021 0404 08/31/2021 0748 08/25/2021 1135 09/08/2021 1549 09/02/21 0757  GLUCAP 89 105* 116* 105* 99   Iron Studies: No results for input(s): IRON, TIBC, TRANSFERRIN, FERRITIN in the last 72 hours. Lab Results  Component Value Date   INR 1.1 04/16/2019   INR 1.12 03/27/2018   INR 1.14 12/05/2017   Studies/Results: MR ANGIO HEAD WO CONTRAST  Result Date: 09/02/2021 CLINICAL DATA:  Follow-up examination for acute stroke. EXAM: MRA HEAD WITHOUT CONTRAST TECHNIQUE: Angiographic images of the Circle of Willis were acquired using MRA technique without intravenous contrast. COMPARISON:  Prior brain MRI from 08/26/2021. FINDINGS: Anterior circulation: Examination mildly degraded by motion artifact. Visualized distal cervical segments of the internal carotid arteries are patent with antegrade flow. Petrous, cavernous, and supraclinoid segments patent without significant stenosis or other abnormality. A1 segments  patent bilaterally. Normal anterior communicating artery complex. Anterior cerebral arteries patent to their distal aspects without stenosis. No M1 stenosis or occlusion. Normal MCA bifurcations. Distal MCA branches well perfused and symmetric. Posterior circulation: Visualized V4 segments widely patent. Right vertebral artery dominant. Neither PICA origin visualized. Basilar patent to its distal aspect without stenosis. Superior cerebral arteries patent bilaterally. Left P1 segment tortuous but widely patent. Both PCA supplied via the basilar as well as small bilateral posterior communicating arteries. PCAs well perfused to their distal aspects without stenosis. Anatomic variants: None significant. Other: No intracranial aneurysm. IMPRESSION: Negative intracranial MRA. No large vessel occlusion, hemodynamically significant stenosis, or other acute vascular abnormality. No aneurysm. Electronically Signed   By: Jeannine Boga M.D.   On: 09/02/2021 00:33   EEG adult  Result Date: 08/29/2021 Derek Jack, MD     09/04/2021  2:13 PM Routine EEG Report [name] [name] is a 60 y.o. male with a history of encephalopathy who is undergoing an EEG to evaluate for seizures. Report: This EEG was acquired with electrodes placed according to the International 10-20 electrode system (including Fp1, Fp2, F3, F4, C3, C4, P3, P4, O1, O2, T3, T4, T5, T6, A1, A2, Fz, Cz, Pz). The following electrodes were missing or displaced: none. The occipital dominant rhythm was 5-6 Hz with intermittent more severe diffuse slowing in the delta range. This activity is reactive to stimulation. Drowsiness was manifested by background fragmentation; deeper stages of sleep were identified by K complexes and sleep spindles. There was no focal slowing. There were no interictal epileptiform discharges. There were no electrographic seizures identified. Photic stimulation and hyperventilation were not performed. Impression and clinical  correlation: This EEG was obtained while awake and asleep and is abnormal due to moderate diffuse slowing with intermittent severe diffuse slowing indicative of global cerebral dysfunction. Su Monks, MD [facility] [phone] If 7pm- 7am, please page neurology on call as listed in [place].   Overnight EEG with video  Result Date: 09/02/2021 Lora Havens, MD     09/03/2021 10:32 AM Patient Name: [name] [name] MRN: [id number] Epilepsy Attending: Lora Havens Referring Physician/Provider: Dr Amie Portland Duration: 08/20/2021 1155 to 09/02/2021 1155  Patient history: 60 yo M with waxing and waning mental status. EEG to evaluate for seizure  Level of alertness: lethargic  AEDs during EEG study: None  Technical aspects: This EEG study was done with scalp electrodes positioned according to the 10-20 International system of electrode placement. Electrical activity was acquired at a sampling rate of 500Hz  and reviewed with a high frequency filter of 70Hz  and a low frequency filter of 1Hz . EEG data were recorded continuously and digitally stored.  Description: No posterior dominant rhythm was seen. EEG showed continuous  generalized 3 to 6 Hz theta-delta slowing. Hyperventilation and photic stimulation were not performed.    ABNORMALITY - Continuous slow, generalized  IMPRESSION: This study is suggestive of  moderate to severe diffuse encephalopathy, nonspecific etiology. No seizures or epileptiform discharges were seen throughout the recording.  Priyanka Barbra Sarks    Medications:  levETIRAcetam 500 mg (09/03/21 0320)   vancomycin 1,000 mg (09/02/21 1721)    apixaban  2.5 mg Per Tube BID   Chlorhexidine Gluconate Cloth  6 each Topical Q0600   feeding supplement (NEPRO CARB STEADY)  1,000 mL Per Tube Q24H   feeding supplement (PROSource TF)  45 mL Per Tube Daily   sevelamer carbonate  2.4 g Per Tube TID WC   sodium chloride flush  3 mL Intravenous Q12H    Dialysis Orders: MWF-[facility] obtain outpatient records Plan for patient to discharge to SNF in [place] and resume HD at [facility]currently held  Assessment/Plan: Enterococcus faecalis bacteremia/Aortic valve endocarditis. ID following-completed ampicillin and ceftriaxone courses. Plan for 8-week course vancomycin with HD (9/3-10/29/22)-repeat MRI spine in 2 weeks (1st week in October).  Osteomyelitis C5-C6/Discitis L4-L5-ID following, continue Vancomycin Acute Encephalopathy with acute infarct on brain MRI-Neuro following; plan for EEG completed. On Keppra ESRD - originally on HD MWF but changed to TTS while at [facility]. LR dc'd. Tolerated yesterday's HD with net UF 2L. K+ improved-now 4.5. Plan for HD 9/27. Hypertension/volume - Blood pressure elevated. Doesn't appear volume overloaded on exam. Not on anti-hypertensives here (Amlodipine dc'd d/t episodes of hypotension previously), may need to resume Amlodipine.  Anemia of CKD - Hgb 8.5-patient on low-dose Epogen at [facility]. Need to obtain outpatient records from [facility]. More likely will resume here.  Secondary Hyperparathyroidism - Ca ok but last PO4 is still high-spoke with Pharmacy-will switch to Renvela powder form. Monitor PO4 trend.  Nutrition - NGT in place; advance to renal diet with restrictions when clinically stable. 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   Tobie Poet, NP [facility] 09/03/2021,10:52 AM  LOS: 2 days

## 2021-09-03 NOTE — Progress Notes (Signed)
LTM EEG stopped and breakdown without any skin breakdown noted.

## 2021-09-04 ENCOUNTER — Inpatient Hospital Stay (HOSPITAL_COMMUNITY): Payer: Medicare Other

## 2021-09-04 DIAGNOSIS — R1312 Dysphagia, oropharyngeal phase: Secondary | ICD-10-CM

## 2021-09-04 DIAGNOSIS — I5042 Chronic combined systolic (congestive) and diastolic (congestive) heart failure: Secondary | ICD-10-CM | POA: Diagnosis not present

## 2021-09-04 DIAGNOSIS — I82C11 Acute embolism and thrombosis of right internal jugular vein: Secondary | ICD-10-CM | POA: Diagnosis not present

## 2021-09-04 DIAGNOSIS — Z7189 Other specified counseling: Secondary | ICD-10-CM | POA: Diagnosis not present

## 2021-09-04 DIAGNOSIS — Z66 Do not resuscitate: Secondary | ICD-10-CM | POA: Diagnosis not present

## 2021-09-04 DIAGNOSIS — G9341 Metabolic encephalopathy: Secondary | ICD-10-CM

## 2021-09-04 DIAGNOSIS — I330 Acute and subacute infective endocarditis: Secondary | ICD-10-CM | POA: Diagnosis not present

## 2021-09-04 DIAGNOSIS — Z515 Encounter for palliative care: Secondary | ICD-10-CM | POA: Diagnosis not present

## 2021-09-04 LAB — GLUCOSE, CAPILLARY
Glucose-Capillary: 68 mg/dL — ABNORMAL LOW (ref 70–99)
Glucose-Capillary: 82 mg/dL (ref 70–99)
Glucose-Capillary: 82 mg/dL (ref 70–99)
Glucose-Capillary: 83 mg/dL (ref 70–99)
Glucose-Capillary: 105 mg/dL — ABNORMAL HIGH (ref 70–99)
Glucose-Capillary: 108 mg/dL — ABNORMAL HIGH (ref 70–99)

## 2021-09-04 MED ORDER — PROSOURCE TF PO LIQD
45.0000 mL | Freq: Four times a day (QID) | ORAL | Status: DC
  Administered 2021-09-04 – 2021-09-05 (×5): 45 mL
  Filled 2021-09-04 (×5): qty 45

## 2021-09-04 MED ORDER — OSMOLITE 1.5 CAL PO LIQD
1000.0000 mL | ORAL | Status: DC
  Administered 2021-09-04: 1000 mL

## 2021-09-04 MED ORDER — RENA-VITE PO TABS
1.0000 | ORAL_TABLET | Freq: Every day | ORAL | Status: DC
  Administered 2021-09-04: 1
  Filled 2021-09-04: qty 1

## 2021-09-04 NOTE — Care Management Important Message (Signed)
Important Message  Patient Details  Name: [name] [name] MRN: [id number] Date of Birth: 08/27/1961   Medicare Important Message Given:  Yes     Orbie Pyo 09/04/2021, 4:34 PM

## 2021-09-04 NOTE — Progress Notes (Signed)
Patient's mother called regarding the phone call she received previously from the unit with regards to patient"mouth full of food while he was been fed. She wanted to know the quality of training we received when it comes to feeding patients.According to her,she used to train people on feeding residents in the [facility] .SHEHERHERS promised to call back tomorrow and speak with the management about it.

## 2021-09-04 NOTE — Progress Notes (Signed)
Patient ID: [name] [name], male   DOB: 1961-11-29, 60 y.o.   MRN: [id number]  [facility] Progress Note   Assessment/ Plan:   1.  Enterococcus faecalis bacteremia with aortic valve endocarditis: Status post completion of ampicillin and ceftriaxone and now transitioned to intravenous vancomycin with hemodialysis for the management of concomitant C5/C6 osteomyelitis and L4-5 discitis. 2. ESRD: Originally Monday/Wednesday/Friday dialysis schedule but was switched to a TTS schedule while admitted to [facility] and we are continuing the latter.  Hemodialysis will be next done tomorrow 9/27. 3. Anemia: Without overt blood loss and suspected to have ESA resistance in the setting of infection.  Will resume/restart ESA. 4. CKD-MBD: Phosphorus level significantly elevated, switched to Renvela powder 5. Nutrition: With NG tube in situ, ongoing tube feeds and will advance diet as tolerated. 6.  Acute encephalopathy with embolic CVAs: On anticoagulation with Eliquis and ongoing antimicrobial therapy for SBE.  Evaluated by palliative care service to assess for goals of care and currently DNR.  Subjective:   Without acute events overnight, mental status continues to wax and wane   Objective:   BP (!) 111/53 (BP Location: Left Arm)   Pulse 99   Temp 98.5 F (36.9 C) (Oral)   Resp (!) 23   Wt 89 kg   SpO2 100%   BMI 25.89 kg/m   Physical Exam: Gen: Appears comfortable resting in bed, intermittently moving his extremities and appears to sluggishly respond to questions. CVS: Pulse regular rhythm, normal rate.  Normal S1 and S2 without gallop or murmur Resp: Coarse breath sounds bilaterally, no distinct rales or rhonchi Abd: Soft, flat, nontender, bowel sounds normal.  Prominent abdominal wall veins. Ext: Trace-1+ upper extremity edema with palpable thrill over right upper arm hero graft  Labs: BMET Recent Labs  Lab 08/30/21 0420 09/02/21 0201 09/03/21 0815  NA  139 139 138  K 4.6 6.0* 4.5  CL 97* 97* 98  CO2 27 23 25   GLUCOSE 79 92 55*  BUN 56* 87* 62*  CREATININE 7.61* 11.60* 9.18*  CALCIUM 9.3 9.9 9.6  PHOS  --  9.4*  --    CBC Recent Labs  Lab 08/30/21 0420 09/02/21 0201 09/03/21 0815  WBC 9.4 9.5 10.5  NEUTROABS  --   --  8.7*  HGB 9.5* 9.1* 8.5*  HCT 30.8* 29.4* 27.4*  MCV 101.0* 101.7* 101.1*  PLT 117* 94* 90*      Medications:     apixaban  2.5 mg Per Tube BID   Chlorhexidine Gluconate Cloth  6 each Topical Q0600   feeding supplement (NEPRO CARB STEADY)  1,000 mL Per Tube Q24H   feeding supplement (PROSource TF)  45 mL Per Tube Daily   sevelamer carbonate  2.4 g Per Tube TID WC   sodium chloride flush  3 mL Intravenous Q12H   Elmarie Shiley, MD 09/04/2021, 9:57 AM

## 2021-09-04 NOTE — Progress Notes (Signed)
Speech Language Pathology Treatment: Dysphagia  Patient Details Name: [name] [name] MRN: [id number] DOB: 01-Jul-1961 Today's Date: 09/04/2021 Time: 1100-1110 SLP Time Calculation (min) (ACUTE ONLY): 10 min  Assessment / Plan / Recommendation Clinical Impression  Pt demonstrates ongoing cognitive impairment impacting attention to PO. Pt can sip from a straw without signs of aspiration, but needs verbal cues to initiate sipping. When spoon fed puree, there is prolonged oral holding, pt does not follow verbal cues to swallow and suction needed in 1/3 trials. Pt to continue current diet of puree and thin texture, not appropriate for upgrade, will f/u later in the week to reassess.   HPI HPI: Patient is a 60 y.o. male with PMH: ESRD on TTS HD; HTN; polysubstance abuse; jugular vein thrombosis; and current sepsis due to enterococcus bacteremia with ?discitis in L4-5 and osteo in C5-6 with L psoas muscle abscess formation as well as aortic valve endocarditis, encephalopathy. He was initially admitted to [facility] on 8/30 with back pain but his neurologic status remained poor and so his parents made him DNR with comfort care status, but neurology recommended seizure medications based on EEG and he had some improvement neurologically and so they changed his status and he was transferred to [facility] for further neurologic evaluation on 9/23.      SLP Plan  Continue with current plan of care      Recommendations for follow up therapy are one component of a multi-disciplinary discharge planning process, led by the attending physician.  Recommendations may be updated based on patient status, additional functional criteria and insurance authorization.    Recommendations  Diet recommendations: Dysphagia 1 (puree);Thin liquid Liquids provided via: Cup;Straw Medication Administration: Crushed with puree                Oral Care Recommendations: Oral care BID;Staff/trained caregiver to provide oral care Follow up  Recommendations: Other (comment) Plan: Continue with current plan of care       GO                Shayann Garbutt, Katherene Ponto  09/04/2021, 1:38 PM

## 2021-09-04 NOTE — Progress Notes (Signed)
Nutrition Follow-up  DOCUMENTATION CODES:   Severe malnutrition in context of acute illness/injury  INTERVENTION:   Initiate tube feeds via Cortrak tube: - Start Osmolite 1.5 @ 20 ml/hr and advance by 10 ml q 8 hours to goal rate of 60 ml/hr (1440 ml/day) - ProSource TF 45 ml QID  Tube feeding regimen at goal provides 2320 kcal, 134 grams of protein, and 1097 ml of H2O.   - Renal MVI daily per tube  NUTRITION DIAGNOSIS:   Severe Malnutrition related to acute illness (encephalopathy, bacteremia, endocarditis) as evidenced by moderate fat depletion, severe muscle depletion.  New diagnosis after completion of NFPE  GOAL:   Patient will meet greater than or equal to 90% of their needs  Met via TF  MONITOR:   Labs, Weight trends, TF tolerance, Skin, I & O's  REASON FOR ASSESSMENT:   Consult Enteral/tube feeding initiation and management  ASSESSMENT:   60 y.o. male with past medical history of ESRD on HD MWF, hypertension, tobacco abuse, polysubstance abuse, diverticulitis, anemia, jugular vein thrombosis, GI bleeding, right pneumothorax admitted on 08/08/2021 with lower back pain and found bacteremia, endocarditis, discitis complicated by prolonged hospitalization r/t ongoing encephalopathy with fluctuating agitation and lethargy.  9/22 - NG tube placed 9/25 - NG tube removed, diet advanced to dysphagia 1 with thin liquids  Per notes, pt's encephalopathy appears to be gradually improving. Last HD was on 9/24 with 2000 ml net UF. Post-HD weight was 89 kg.  Per RN, pt holding food in mouth and spitting it out. Discussed with MD who reached out to family who consented for Cortrak. Cortrak placed today, tip gastric. Consult received for tube feeding initiation and management.  Attempted to speak with pt at bedside. Unable to obtain any meaningful information from pt at this time. Will start tube feeds at low rate and slowly advance to goal.  Admit weight: 91 kg Current  weight: 89 kg  Medications reviewed and include: renvela TID, IV keppra, IV abx  Labs reviewed: phosphorus 9.4 on 9/24, hemoglobin 8.5 CBG's: 68-116 x 24 hours  I/O's: -1.2 L since admit  NUTRITION - FOCUSED PHYSICAL EXAM:  Flowsheet Row Most Recent Value  Orbital Region Moderate depletion  Upper Arm Region Moderate depletion  Thoracic and Lumbar Region Moderate depletion  Buccal Region Moderate depletion  Temple Region Moderate depletion  Clavicle Bone Region Severe depletion  Clavicle and Acromion Bone Region Severe depletion  Scapular Bone Region Moderate depletion  Dorsal Hand Moderate depletion  Patellar Region Moderate depletion  Anterior Thigh Region Severe depletion  Posterior Calf Region Severe depletion  Edema (RD Assessment) None  Hair Reviewed  Eyes Reviewed  Mouth Reviewed  Skin Reviewed  Nails Reviewed       Diet Order:   Diet Order             DIET - DYS 1 Room service appropriate? Yes; Fluid consistency: Thin  Diet effective now                   EDUCATION NEEDS:   No education needs have been identified at this time  Skin:  Skin Assessment: Reviewed RN Assessment (Stage II buttocks)  Last BM:  08/29/21  Height:   Ht Readings from Last 1 Encounters:  08/16/21 6' 1"  (1.854 m)    Weight:   Wt Readings from Last 1 Encounters:  09/04/21 89 kg    Ideal Body Weight:  83.6 kg  BMI:  Body mass index is 25.89 kg/m.  Estimated Nutritional Needs:   Kcal:  2400-2700  Protein:  120-140 grams  Fluid:  UOP +1L    Gustavus Bryant, MS, RD, LDN Inpatient Clinical Dietitian Please see [facility] for contact information.

## 2021-09-04 NOTE — Progress Notes (Signed)
Subjective: Seems to be slightly better than previously documented  Exam: Vitals:   09/04/21 0400 09/04/21 0724  BP: 110/61 (!) 111/53  Pulse:  99  Resp: 20 (!) 23  Temp: 98.8 F (37.1 C) 98.5 F (36.9 C)  SpO2:  100%   Gen: In bed, NAD Resp: non-labored breathing, no acute distress Abd: soft, nt  Neuro: MS: Awake, but dysarthric.  He gives me the month as October, when asked the year he repeats October.  Will not give me his age.  He does follow simple commands, but not very reliably. CN: Extraocular movements are intact, he does finger count but I am not sure if he has a mild left field cut, face is symmetric Motor: He moves all extremities, once he cooperates with strength testing he is relatively symmetric, though at first I was concerned he might have a left hemiparesis once he cooperated it was relatively symmetric. Sensory: He does not extinguish to bilateral stimulation  Pertinent Labs: BUN 62 yesterday  Impression: 60 year old male with ESRD on HD,htn, history of polysubstance abuse, on anticoagulation with spinal osteomyelitis, E faecalis bacteremia, endocarditis with encephalopathy.  His encephalopathy appears to be gradually improving, supporting a toxic metabolic etiology.  There has been significant concern for seizures, due to episodes of sudden mental status change and the finding later of focal slowing on EEG.  I would favor continuing levetiracetam at least in the short-term, though he likely does not need it long-term.  With some improvement in his mental status, I think his neurological prognosis is relatively uncertain, and therefore I would not favor basing decisions regarding withdrawal of care, etc. based solely on his neurological status, though with his comorbidities, will defer to primary team to discuss these issues.  I agree that his strokes are likely septic emboli, he is already anticoagulated and it is okay to continue this though no specific  antithrombotic therapy is indicated for endocarditis related strokes.   Recommendations: 1) continue Keppra 500 mg twice daily 2) neurology will continue to follow.  Roland Rack, MD [facility] [phone]  If 7pm- 7am, please page neurology on call as listed in [place].

## 2021-09-04 NOTE — Procedures (Signed)
Cortrak  Person Inserting Tube:  Alroy Dust, Jheremy Boger L, RD Tube Type:  Cortrak - 43 inches Tube Size:  10 Tube Location:  Left nare Initial Placement:  Stomach Secured by: Bridle Technique Used to Measure Tube Placement:  Marking at nare/corner of mouth Cortrak Secured At:  63 cm  Cortrak Tube Team Note:  Consult received to place a Cortrak feeding tube.   X-ray is required, abdominal x-ray has been ordered by the Cortrak team. Please confirm tube placement before using the Cortrak tube.   If the tube becomes dislodged please keep the tube and contact the Cortrak team at [email or web address] ([email or web address]) for replacement.  If after hours and replacement cannot be delayed, place a NG tube and confirm placement with an abdominal x-ray.    Hermina Barters BS, PLDN Clinical Dietitian See [facility] for contact information.

## 2021-09-04 NOTE — Progress Notes (Signed)
[place] for Infectious Disease  Date of Admission:  09/07/2021     Total days of antibiotics 24         ASSESSMENT:  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 Coretrak placed. Neurology will continue seizure medication for near term. Will continue with original plan with antimicrobial therapy through 09/23/21. Continue current dose of vancomycin with dialysis. Remaining supportive care per primary team. ID will continue to follow.   PLAN:  Continue vancomycin with dialysis through 09/23/21. Seizure management per Neurology Remaining supportive care per Primary Team. ID will continue to follow.   Principal Problem:   Endocarditis Active Problems:   End-stage renal disease on hemodialysis (HCC)   Polysubstance abuse ([place])   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 (HCC)   Chronic combined systolic and diastolic CHF (congestive heart failure) (HCC)   Goals of care, counseling/discussion   DNR (do not resuscitate)   Vertebral osteomyelitis ([place])   Paraspinal abscess (HCC)    apixaban  2.5 mg Per Tube BID   Chlorhexidine Gluconate Cloth  6 each Topical Q0600   feeding supplement (PROSource TF)  45 mL Per Tube QID   sevelamer carbonate  2.4 g Per Tube TID WC   sodium chloride flush  3 mL Intravenous Q12H    SUBJECTIVE:  Afebrile overnight with no acute events. More alert today. Confused about year and place. Follows some commands.   Allergies  Allergen Reactions   Other     Other reaction(s): Other (See Comments) blister   Tape Other (See Comments)    Plastic Tape Causes blister     Review of Systems: Review of Systems  Unable to perform ROS: Mental status change    OBJECTIVE: Vitals:   09/03/21 2336 09/04/21 0400 09/04/21 0500 09/04/21 0724  BP: (!) 114/58 110/61  (!) 111/53  Pulse: 93   99   Resp: 20 20  (!) 23  Temp: 97.9 F (36.6 C) 98.8 F (37.1 C)  98.5 F (36.9 C)  TempSrc: Oral Oral  Oral  SpO2:    100%  Weight:   89 kg    Body mass index is 25.89 kg/m.  Physical Exam Constitutional:      General: He is not in acute distress.    Appearance: He is well-developed.     Comments: Lying in bed with head of bed elevated; confused  HENT:     Nose:     Comments: Coretrak in place.  Cardiovascular:     Rate and Rhythm: Normal rate and regular rhythm.     Heart sounds: Murmur heard.  Pulmonary:     Effort: Pulmonary effort is normal.     Breath sounds: Normal breath sounds.  Skin:    General: Skin is warm and dry.  Neurological:     Mental Status: He is alert. He is disoriented.    Lab Results Lab Results  Component Value Date   WBC 10.5 09/03/2021   HGB 8.5 (L) 09/03/2021   HCT 27.4 (L) 09/03/2021   MCV 101.1 (H) 09/03/2021   PLT 90 (L) 09/03/2021    Lab Results  Component Value Date   CREATININE 9.18 (H) 09/03/2021   BUN 62 (H) 09/03/2021   NA 138 09/03/2021   K 4.5 09/03/2021   CL 98 09/03/2021   CO2 25 09/03/2021    Lab Results  Component Value Date  ALT 14 09/03/2021   AST 24 09/03/2021   ALKPHOS 59 09/03/2021   BILITOT 1.6 (H) 09/03/2021     Microbiology: Recent Results (from the past 240 hour(s))  MRSA Next Gen by PCR, Nasal     Status: None   Collection Time: 08/30/21 12:00 PM   Specimen: Nasal Mucosa; Nasal Swab  Result Value Ref Range Status   MRSA by PCR Next Gen NOT DETECTED NOT DETECTED Final    Comment: (NOTE) The GeneXpert MRSA Assay (FDA approved for NASAL specimens only), is one component of a comprehensive MRSA colonization surveillance program. It is not intended to diagnose MRSA infection nor to guide or monitor treatment for MRSA infections. Test performance is not FDA approved in patients less than 6 years old. Performed at [facility], [address], [place], [place] [address]      Terri Piedra, [place] for Infectious Disease [facility]  09/04/2021  1:21 PM

## 2021-09-04 NOTE — Progress Notes (Signed)
PROGRESS NOTE  [name] [name]:[id number] DOB: November 21, 1961 DOA: 08/20/2021 PCP: Pcp, No  Brief History   The patient is a 60 year old man who was transferred to [facility] on 08/26/2021 for higher level of care and continuous EEG monitoring. The patient himself is unable to provide a history. [name] [name] is a 60 y.o. 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  He was transferred to [facility] from [facility] today after presenting there on 8/30 with back pain.   His neurologic status remained poor and so his parents made him DNR with comfort care status, but neurology recommended seizure medications based on EEG and he had some improvement neurologically and so they changed his status and he was transferred to [facility] for further neurologic evaluation.  He is currently responsive only to pain, with withdrawing and moans and nonsensical speech.  The patient was on EEG for most of 09/02/2021. He underwent HD on that afternoon. After he returned to his room on 09/02/2021, the patient had improved level of alertness. He was oriented to self and is able to follow simple commands. Family is aware.  SLP has been asked to evaluate the patient for swallow recommendations. The patient was started on a pureed diet, however, nursing has reported that they were forced to stop trying to feed him last evening as the patient was pocketing and not swallowing food. The patient had a glucose of 55 this morning. Nutrition was consulted and recommended Cortrack for tube feeds until the patient is able to safely consume enough by mouth. SLP re-evaluated today and continues to recommend pureed diet with thin liquids.  Consultants  Neurology Palliative  Care Nephrology Infectious Disease  Procedures  ESRD EEG  Antibiotics   Anti-infectives (From admission, onward)    Start     Dose/Rate Route Frequency Ordered Stop   09/02/21 1415  vancomycin (VANCOCIN) IVPB 1000 mg/200 mL premix        1,000 mg 200 mL/hr over 60 Minutes Intravenous Every T-Th-Sa (Hemodialysis) 09/02/21 1318     09/04/2021 1645  vancomycin (VANCOCIN) IVPB 1000 mg/200 mL premix        1,000 mg 200 mL/hr over 60 Minutes Intravenous  Once 09/02/2021 1550 08/13/2021 1711   08/12/2021 1200  ampicillin  Status:  Discontinued       Note to Pharmacy: Indication:  E faecalis Bacteremia, lumbar discitis/endocarditis  Last Day of Therapy:  09/23/21 Labs - Weekly CBC w diff, CMP  Line removed at the end of treatment Fax weekly labs to  Dr.Ravishankar [phone]   2 g Intravenous Every 12 hours 08/30/2021 1101 08/14/2021 1110   08/11/2021 1200  cefTRIAXone (ROCEPHIN) IVPB  Status:  Discontinued       Note to Pharmacy: Indication:  E faecalis Bacteremia, lumbar discitis/endocarditis  Last Day of Therapy:  09/23/21 Labs - Weekly CBC w diff, CMP  Line removed at the end of treatment Fax weekly labs to  Dr.Ravishankar [phone]   2 g Intravenous Every 12 hours 08/10/2021 1101 08/18/2021 1110   09/03/2021 1200  ampicillin (OMNIPEN) 2 g in sodium chloride 0.9 % 100 mL IVPB  Status:  Discontinued        2 g 300 mL/hr over 20 Minutes Intravenous Every  12 hours 08/31/2021 1110 08/28/2021 1531   08/21/2021 1200  cefTRIAXone (ROCEPHIN) 2 g in sodium chloride 0.9 % 100 mL IVPB  Status:  Discontinued        2 g 200 mL/hr over 30 Minutes Intravenous Every 12 hours 08/20/2021 1110 08/28/2021 1531      Subjective  The patient is lying in bed. He is responsive to voice.   Objective   Vitals:  Vitals:   09/04/21 0400 09/04/21 0724  BP: 110/61 (!) 111/53  Pulse:  99  Resp: 20 (!) 23  Temp: 98.8 F (37.1 C) 98.5 F (36.9 C)  SpO2:  100%    Exam:  Constitutional:  The patient is somnolent,  but opens eyes and responds verbally to voice. No acute distress. Eyes:  pupils and irises appear normal EOMBI- intact Normal lids and conjunctivae Respiratory:  CTA bilaterally, no w/r/r.  Respiratory effort normal. No retractions or accessory muscle use Cardiovascular:  RRR, no m/r/g No LE extremity edema   Normal pedal pulses Abdomen:  Abdomen appears normal; no tenderness or masses No hernias No HSM Musculoskeletal:  Digits/nails BUE: no clubbing, cyanosis, petechiae, infection exam of joints, bones, muscles of at least one of following: head/neck, RUE, LUE, RLE, LLE   Skin:  No rashes, lesions, ulcers palpation of skin: no induration or nodules Neurologic:  Patient is moving all extremities Improved level of alertness and interactivity Dysarthric. CN II - XII grossly intact Psychiatric:  Unable to evaluate  I have personally reviewed the following:   Today's Data  Vitals  Lab Data  CBC, CMP, Glucose  Micro Data  Blood culture X2 positive for growth of E. Faecalis on 08/11/2021. Surveillance cultures from 08/12/2021  Imaging  Intracranial MRA  Cardiology Data  TEE EKG  Scheduled Meds:  apixaban  2.5 mg Per Tube BID   Chlorhexidine Gluconate Cloth  6 each Topical Q0600   feeding supplement (PROSource TF)  45 mL Per Tube QID   sevelamer carbonate  2.4 g Per Tube TID WC   sodium chloride flush  3 mL Intravenous Q12H   Continuous Infusions:  feeding supplement (OSMOLITE 1.5 CAL) 1,000 mL (09/04/21 1259)   levETIRAcetam 500 mg (09/04/21 1339)   vancomycin 1,000 mg (09/02/21 1721)    Principal Problem:   Endocarditis Active Problems:   End-stage renal disease on hemodialysis (HCC)   Polysubstance abuse ([place])   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 (HCC)   Chronic combined systolic and diastolic CHF (congestive heart failure) (HCC)   Goals of care, counseling/discussion   DNR (do not resuscitate)   Vertebral osteomyelitis ([place])    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 Follow up appointment 10/13 with ID.    osteomyelitis in C5-C6, discitis in L4-L5 Left psoas muscle infection with superimposed 1.3 cm phlegmon/early abscess formation -Continue antibiotics and recommendations from ID.  Continue pain management and bowel regimen   Dysarthria:  SLP consulted to evaluate the patient for safe strategies and consistencies of food. PUreed diet with thin liquids was recommended, however nursing reported that they felt that feeding the patient was unsafe as he frequently pocketed his food without swallowing. Cortrack was recommended by nutrition. I discussed this with the patient's father, [name] [name]. He consented to placement of cortrack. The patient was re-evaluated by  SLP. Recommendation continues to be for pureed diet with thin liquids, however the patient will have tube feeds to ensure adequate caloric intake and sufficient blood sugars until he is able to take a full diet by mouth.  Acute encephalopathy with Acute infarct on brain MRI- Improved with improved level of alertness and mobility. Patint verbally responsive this morning.  - management and further workup of seizure contribution management per neurology   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 -Eliquis 2.5 mg twice daily as per Dr. Keturah Barre recommendations.      End-stage renal disease on hemodialysis -Nephrology following.  Dialysis as per nephrology schedule -Dialysis schedule changed from M WF to TTS   Anemia of chronic disease -From renal failure - follow with dialysis labs  H/o Hypertension. Currently Hypotension -Blood pressure currently stable.  Off amlodipine  Chronic combined CHF -LVEF of 30 to 35% as per TTE on 08/13/2021.  Volume managed  by dialysis.  Strict input output.  Daily weights.  Fluid restriction  Depression- stable   Generalized deconditioning -PT/OT recommended. Social worker following.   Goals of care Pt is a DNR. Patient's father has now consented to temporary tube feeds.   DVT prophylaxis: apixaban (ELIQUIS) tablet 2.5 mg Start: 08/30/21 2200 apixaban (ELIQUIS) tablet 2.5 mg  Code Status: DNR Family Communications: None available   [name] Pedigo, DO [facility] Direct contact: see [email or web address]  7PM-7AM contact night coverage as above  09/04/2021, 3:02 PM  LOS: 1 day

## 2021-09-04 NOTE — Progress Notes (Signed)
CBG checked,= 68. Protein and apple juice given. CBG rechecked after 39 minutes, blood glucose level =82.

## 2021-09-04 NOTE — Progress Notes (Signed)
Daily Progress Note   Patient Name: [name] [name]       Date: 09/04/2021 DOB: 1961-02-15  Age: 60 y.o. MRN#: [id number] Attending Physician: Karie Kirks, DO Primary Care Physician: Pcp, No Admit Date: 08/11/2021 Length of Stay: 3 days  Reason for Consultation/Follow-up: Establishing goals of care  HPI/Patient Profile:  60 y.o. male  with past medical history of ESRD on HD MWF, hypertension, tobacco abuse, polysubstance abuse, diverticulitis, anemia, jugular vein thrombosis, GI bleeding, right pneumothorax admitted on 08/08/2021 with lower back pain and found bacteremia, endocarditis, discitis. Prolonged hospitalization complicated by ongoing encephalopathy with fluctuating agitation and lethargy. Treated for potential Wernike's encephalopathy. MRI with possible small acute infarcts. Concern for potential seizure activity.    He was transferred to [facility] for further EEG. Overall, EEG indicates likely toxic metabolic encephalopathy from multiple infections (infective endocarditis, bacteremia, osteomyelitis, and strokes). Discussions with family and palliative given patient essentially unresponsive and were considering going back to comfort care and/or hospice. Awaiting further information from neuro to guide decision making.   PMT was consulted for [name] [name] discussions.  Subjective:   Subjective: Chart Reviewed. Updates received. Patient Assessed. Created space and opportunity for patient  and family to explore thoughts and feelings regarding current medical situation.  Today's Discussion: I met with the patient and his long-time friend [name] [name] at the bedside. [name] [name] had questions on the patient's clinical status. He states "he doesn't look like what they said he did". We reviewed his condition over the previous week and the noted change yesterday. We discussed cautious optimism, understanding that he has made an improvement and he may make more improvements, he may stabalize at this level, or he could  decline. We discussed pray/hope for the best, but continue to plan for what to do if things do not go the way we'd like. He agreed with this concept. He states the patient is a person of faith. Offered spiritual care consult and he feels the patient would like this.   The patient denies pain, follows commands. He is laying somewhat crooked int eh bed and I asked if he's comfortable like that and he responded (after delay) that he is, offered to reposition him and he declined. No other concerns were verbalized.  I offered support to the patient and his loved one, answered all questions, addressed all concerns.  Attempt made to call the patient's parents, but "voicemail box not set-up". Will continue to try and update them, although it appears the hospitalist team has been in contact.  ROS: Limited due to mental status Review of Systems  Constitutional:        Denies pain in general  Respiratory:  Negative for shortness of breath.   Gastrointestinal:  Negative for abdominal pain.   Objective:   Vital Signs:  BP (!) 111/53 (BP Location: Left Arm)   Pulse 99   Temp 98.5 F (36.9 C) (Oral)   Resp (!) 23   Wt 89 kg   SpO2 100%   BMI 25.89 kg/m   Physical Exam: Physical Exam Vitals and nursing note reviewed.  Constitutional:      General: He is not in acute distress.    Appearance: He is ill-appearing.  HENT:     Head: Normocephalic and atraumatic.  Cardiovascular:     Rate and Rhythm: Normal rate.  Pulmonary:     Effort: Pulmonary effort is normal. No respiratory distress.     Breath sounds: No wheezing or rhonchi.  Abdominal:     General:  Abdomen is flat.     Palpations: Abdomen is soft.  Skin:    General: Skin is warm and dry.  Neurological:     Mental Status: He is alert.     Motor: Weakness (generalized) present.     Comments: Following simple commands. More attempts to speak today, voice seems stronger today. Answers some questions, answers appropriately, some delayed  processing    Palliative Assessment/Data: 30%   Assessment & Plan:   Impression: Present on Admission:  Endocarditis  Polysubstance abuse ([place])  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 (HCC)  Chronic combined systolic and diastolic CHF (congestive heart failure) ([place])  DNR (do not resuscitate)  Likely multifactorial etiology behind changes to mental status including toxic encephalopathy from infections, ESRD, CVAs. Yesterday he made a significant improvement in his mental status compared to the past week. Still with some delayed processing, oriented x 1-2, interactive, follows simple commands. Voice/attempts to speak seem stronger today.  Overall still feel DNR is appropriate. Unsure of how much more (if any) he will improve vs. Stability vs. Potential for re-worsening. In his current state I feel he will need long term 24 hour care. Parents with some health issues likely not an option for home care with his parents. Will need to follow over the next several days to better gauge trajectory of his clinical progression.  SUMMARY OF RECOMMENDATIONS   Remain DNR for now Continue to treat the treatable Follow for trajectory of clinical progress Further GOC discussions pending any changes Keep parents up to date on his status PMT will continue to follow  Code Status: DNR  Prognosis: Unable to determine  Discharge Planning: To Be Determined  Discussed with: Dr. Benny Lennert, nursing staff, patient's loved ones  Thank you for allowing [place] to participate in the care of [name] [name] PMT will continue to support holistically.  Time Total: 45 min  Visit consisted of counseling and education dealing with the complex and emotionally intense issues of symptom management and palliative care in the setting of serious and potentially life-threatening illness. Greater than 50%  of this time was spent counseling and coordinating care related to the above assessment and plan.  Walden Field, NP Palliative Medicine Team  Team Phone # [phone] (Nights/Weekends)  08/08/2021, 8:17 AM

## 2021-09-05 ENCOUNTER — Inpatient Hospital Stay (HOSPITAL_COMMUNITY): Payer: Medicare Other

## 2021-09-05 DIAGNOSIS — E43 Unspecified severe protein-calorie malnutrition: Secondary | ICD-10-CM

## 2021-09-05 DIAGNOSIS — Z515 Encounter for palliative care: Secondary | ICD-10-CM | POA: Diagnosis not present

## 2021-09-05 DIAGNOSIS — I330 Acute and subacute infective endocarditis: Secondary | ICD-10-CM | POA: Diagnosis not present

## 2021-09-05 DIAGNOSIS — G9340 Encephalopathy, unspecified: Secondary | ICD-10-CM

## 2021-09-05 DIAGNOSIS — Z7189 Other specified counseling: Secondary | ICD-10-CM | POA: Diagnosis not present

## 2021-09-05 DIAGNOSIS — I82C11 Acute embolism and thrombosis of right internal jugular vein: Secondary | ICD-10-CM | POA: Diagnosis not present

## 2021-09-05 LAB — CBC
HCT: 28.5 % — ABNORMAL LOW (ref 39.0–52.0)
Hemoglobin: 9 g/dL — ABNORMAL LOW (ref 13.0–17.0)
MCH: 31.4 pg (ref 26.0–34.0)
MCHC: 31.6 g/dL (ref 30.0–36.0)
MCV: 99.3 fL (ref 80.0–100.0)
Platelets: 99 10*3/uL — ABNORMAL LOW (ref 150–400)
RBC: 2.87 MIL/uL — ABNORMAL LOW (ref 4.22–5.81)
RDW: 23.9 % — ABNORMAL HIGH (ref 11.5–15.5)
WBC: 8 10*3/uL (ref 4.0–10.5)
nRBC: 3.4 % — ABNORMAL HIGH (ref 0.0–0.2)

## 2021-09-05 LAB — GLUCOSE, CAPILLARY
Glucose-Capillary: 122 mg/dL — ABNORMAL HIGH (ref 70–99)
Glucose-Capillary: 118 mg/dL — ABNORMAL HIGH (ref 70–99)
Glucose-Capillary: 133 mg/dL — ABNORMAL HIGH (ref 70–99)
Glucose-Capillary: 106 mg/dL — ABNORMAL HIGH (ref 70–99)

## 2021-09-05 LAB — RENAL FUNCTION PANEL
Albumin: 2.2 g/dL — ABNORMAL LOW (ref 3.5–5.0)
Anion gap: 18 — ABNORMAL HIGH (ref 5–15)
BUN: 100 mg/dL — ABNORMAL HIGH (ref 6–20)
CO2: 24 mmol/L (ref 22–32)
Calcium: 9.8 mg/dL (ref 8.9–10.3)
Chloride: 98 mmol/L (ref 98–111)
Creatinine, Ser: 11.63 mg/dL — ABNORMAL HIGH (ref 0.61–1.24)
GFR, Estimated: 5 mL/min — ABNORMAL LOW (ref >60)
Glucose, Bld: 108 mg/dL — ABNORMAL HIGH (ref 70–99)
Phosphorus: 8.1 mg/dL — ABNORMAL HIGH (ref 2.5–4.6)
Potassium: 5.3 mmol/L — ABNORMAL HIGH (ref 3.5–5.1)
Sodium: 140 mmol/L (ref 135–145)

## 2021-09-05 LAB — VANCOMYCIN, RANDOM: Vancomycin Rm: 17

## 2021-09-05 LAB — HEPATITIS B SURFACE ANTIBODY, QUANTITATIVE: Hep B S AB Quant (Post): 28.3 m[IU]/mL (ref >9.9)

## 2021-09-05 MED ORDER — HEPARIN SODIUM (PORCINE) 1000 UNIT/ML DIALYSIS: 40.0000 [IU]/kg | INTRAMUSCULAR | Status: DC | PRN

## 2021-09-05 MED ORDER — DEXTROSE 5 % IV SOLN: INTRAVENOUS | Status: DC

## 2021-09-05 MED ORDER — LEVETIRACETAM 100 MG/ML PO SOLN
500.0000 mg | Freq: Two times a day (BID) | ORAL | Status: DC
  Administered 2021-09-05: 500 mg
  Filled 2021-09-05: qty 5

## 2021-09-06 NOTE — Progress Notes (Signed)
[facility] called back ,patient is not suitable for organ donations. Reference number obtained.  Patient placement notified. Preparing taking the [facility] to the [facility].

## 2021-09-09 NOTE — Progress Notes (Signed)
Patient ID: [name] [name], male   DOB: 1961-12-09, 60 y.o.   MRN: [id number]  [facility] Progress Note   Assessment/ Plan:   1.  Enterococcus faecalis bacteremia with aortic valve endocarditis: Status post completion of ampicillin and ceftriaxone and now transitioned to intravenous vancomycin with hemodialysis for the management of concomitant C5/C6 osteomyelitis and L4-5 discitis. 2. ESRD: Was previously on MWF schedule and switched to TTS schedule in the hospital; he is on schedule for dialysis today.  Overall prognosis is guarded with his current mental status and it appears unclear whether he would be able to tolerate dialysis safely in the recliner.  This will be attempted on Thursday. 3. Anemia: Without overt blood loss and suspected to have ESA resistance in the setting of infection.  Will resume/restart ESA. 4. CKD-MBD: Phosphorus level significantly elevated, switched to Renvela powder 5. Nutrition: With NG tube in situ, ongoing tube feeds and will advance diet as tolerated. 6.  Acute encephalopathy with embolic CVAs: On anticoagulation with Eliquis and ongoing antimicrobial therapy for SBE.  Evaluated by palliative care service to assess for goals of care and currently DNR.  Subjective:   Without acute events overnight, oral feeds felt to be challenging due to pocketing in mouth/suboptimal swallowing.  Phone call from his mother noted overnight expressing concern over quality of care.   Objective:   BP (!) 106/51 (BP Location: Left Arm)   Pulse 100   Temp 99.1 F (37.3 C) (Axillary)   Resp 20   Wt 89 kg   SpO2 100%   BMI 25.89 kg/m   Physical Exam: Gen: Comfortably resting in bed, appears lethargic and does not respond verbally to questions.  Oatmeal seen over his beard. CVS: Pulse regular rhythm, normal rate.  Normal S1 and S2 without gallop or murmur Resp: Coarse breath sounds bilaterally, no distinct rales or rhonchi Abd: Soft, flat, nontender, bowel sounds  normal.  Prominent abdominal wall veins. Ext: Trace-1+ upper extremity edema with palpable thrill over right upper arm hero graft  Labs: BMET Recent Labs  Lab 08/30/21 0420 09/02/21 0201 09/03/21 0815  NA 139 139 138  K 4.6 6.0* 4.5  CL 97* 97* 98  CO2 27 23 25   GLUCOSE 79 92 55*  BUN 56* 87* 62*  CREATININE 7.61* 11.60* 9.18*  CALCIUM 9.3 9.9 9.6  PHOS  --  9.4*  --    CBC Recent Labs  Lab 08/30/21 0420 09/02/21 0201 09/03/21 0815  WBC 9.4 9.5 10.5  NEUTROABS  --   --  8.7*  HGB 9.5* 9.1* 8.5*  HCT 30.8* 29.4* 27.4*  MCV 101.0* 101.7* 101.1*  PLT 117* 94* 90*      Medications:     apixaban  2.5 mg Per Tube BID   Chlorhexidine Gluconate Cloth  6 each Topical Q0600   feeding supplement (PROSource TF)  45 mL Per Tube QID   multivitamin  1 tablet Per Tube QHS   sevelamer carbonate  2.4 g Per Tube TID WC   sodium chloride flush  3 mL Intravenous Q12H   Elmarie Shiley, MD 09/10/2021, 9:01 AM

## 2021-09-09 NOTE — Progress Notes (Signed)
PROGRESS NOTE  ANMOL FLECK MVH:[id number] DOB: [birth year] DOA: 08/24/2021 PCP: Pcp, No  Brief History   The patient is a 60 year old man who was transferred to [facility] on 08/12/2021 for higher level of care and continuous EEG monitoring. The patient himself is unable to provide a history. ERIN UECKER is a 60 y.o. 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  He was transferred to [facility] from [facility] today after presenting there on 8/30 with back pain.   His neurologic status remained poor and so his parents made him DNR with comfort care status, but neurology recommended seizure medications based on EEG and he had some improvement neurologically and so they changed his status and he was transferred to [facility] for further neurologic evaluation.  He is currently responsive only to pain, with withdrawing and moans and nonsensical speech.  The patient was on EEG for most of 09/02/2021. He underwent HD on that afternoon. After he returned to his room on 09/02/2021, the patient had improved level of alertness. He was oriented to self and is able to follow simple commands. Family is aware.  SLP has been asked to evaluate the patient for swallow recommendations. The patient was started on a pureed diet, however, nursing has reported that they were forced to stop trying to feed him last evening as the patient was pocketing and not swallowing food. The patient had a glucose of 55 this morning. Nutrition was consulted and recommended Cortrack for tube feeds until the patient is able to safely consume enough by mouth. SLP re-evaluated today and continues to recommend pureed diet with thin liquids.  Cortrack tube was placed and feeds were being slowly  titrated up. At roughly 1800 this evening the patient apparently vomited tube feeding and aspirated. According to nursing he is saturating 100% on room air, but has a respiratory rate in the high twenties. Heart rate is 113. Tube feeds have been stopped, and the patient has been placed on D5 to maintain hydration and glucose. CXR is pending.  Consultants  Neurology Palliative Care Nephrology Infectious Disease  Procedures  ESRD EEG  Antibiotics   Anti-infectives (From admission, onward)    Start     Dose/Rate Route Frequency Ordered Stop   09/02/21 1415  vancomycin (VANCOCIN) IVPB 1000 mg/200 mL premix        1,000 mg 200 mL/hr over 60 Minutes Intravenous Every T-Th-Sa (Hemodialysis) 09/02/21 1318     09/03/2021 1645  vancomycin (VANCOCIN) IVPB 1000 mg/200 mL premix        1,000 mg 200 mL/hr over 60 Minutes Intravenous  Once 08/11/2021 1550 09/04/2021 1711   08/16/2021 1200  ampicillin  Status:  Discontinued       Note to Pharmacy: Indication:  E faecalis Bacteremia, lumbar discitis/endocarditis  Last Day of Therapy:  09/23/21 Labs - Weekly CBC w diff, CMP  Line removed at the end of treatment Fax weekly labs to  Dr.Ravishankar [phone]   2 g Intravenous Every 12 hours 09/04/2021 1101 08/21/2021 1110   09/06/2021 1200  cefTRIAXone (ROCEPHIN) IVPB  Status:  Discontinued       Note to Pharmacy: Indication:  E faecalis Bacteremia, lumbar discitis/endocarditis  Last Day of Therapy:  09/23/21 Labs - Weekly  CBC w diff, CMP  Line removed at the end of treatment Fax weekly labs to  Dr.Ravishankar [phone]   2 g Intravenous Every 12 hours 09/08/2021 1101 08/21/2021 1110   09/03/2021 1200  ampicillin (OMNIPEN) 2 g in sodium chloride 0.9 % 100 mL IVPB  Status:  Discontinued        2 g 300 mL/hr over 20 Minutes Intravenous Every 12 hours 08/24/2021 1110 09/07/2021 1531   08/15/2021 1200  cefTRIAXone (ROCEPHIN) 2 g in sodium chloride 0.9 % 100 mL IVPB  Status:  Discontinued        2 g 200 mL/hr over  30 Minutes Intravenous Every 12 hours 08/17/2021 1110 08/27/2021 1531      Subjective  The patient is lying in bed. He is responsive to voice.   Objective   Vitals:  Vitals:   2021-10-03 1700 Oct 03, 2021 1838  BP: (!) 114/56 116/72  Pulse: 87 (!) 113  Resp:  (!) 33  Temp:  98.7 F (37.1 C)  SpO2:  100%    Exam:  Constitutional:  The patient is awake and opens eyes and responds verbally to voice. No acute distress. Respiratory:  CTA bilaterally, no w/r/r.  Respiratory effort normal. No retractions or accessory muscle use Cardiovascular:  RRR, no m/r/g No LE extremity edema   Normal pedal pulses Abdomen:  Abdomen appears normal; no tenderness or masses No hernias No HSM Musculoskeletal:  Digits/nails BUE: no clubbing, cyanosis, petechiae, infection exam of joints, bones, muscles of at least one of following: head/neck, RUE, LUE, RLE, LLE   Skin:  No rashes, lesions, ulcers palpation of skin: no induration or nodules Neurologic:  Patient is moving all extremities Improved level of alertness and interactivity Dysarthric. CN II - XII grossly intact Psychiatric:  Unable to evaluate  I have personally reviewed the following:   Today's Data  Vitals  Lab Data  CBC, BMP, Glucose  Micro Data  Blood culture X2 positive for growth of E. Faecalis on 08/11/2021. Surveillance cultures from 08/12/2021 have had no growth  Imaging  Intracranial MRA  Cardiology Data  TEE EKG  Scheduled Meds:  apixaban  2.5 mg Per Tube BID   Chlorhexidine Gluconate Cloth  6 each Topical Q0600   levETIRAcetam  500 mg Per Tube BID   multivitamin  1 tablet Per Tube QHS   sevelamer carbonate  2.4 g Per Tube TID WC   sodium chloride flush  3 mL Intravenous Q12H   Continuous Infusions:  dextrose 100 mL/hr at Oct 03, 2021 1847   vancomycin 1,000 mg (Oct 03, 2021 1650)    Principal Problem:   Endocarditis Active Problems:   End-stage renal disease on hemodialysis (HCC)   Polysubstance abuse ([place])    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 (HCC)   Chronic combined systolic and diastolic CHF (congestive heart failure) (HCC)   Goals of care, counseling/discussion   DNR (do not resuscitate)   Vertebral osteomyelitis ([place])   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 Follow up appointment 10/13 with ID.    osteomyelitis in C5-C6, discitis in L4-L5 Left psoas muscle infection with superimposed 1.3 cm phlegmon/early abscess formation -Continue antibiotics and recommendations from ID.  Continue pain management and bowel regimen   Dysarthria:  SLP consulted to evaluate the patient  for safe strategies and consistencies of food. PUreed diet with thin liquids was recommended, however nursing reported that they felt that feeding the patient was unsafe as he frequently pocketed his food without swallowing. Cortrack was recommended by nutrition. I discussed this with the patient's father, Camren Lipsett. He consented to placement of cortrack. The patient was re-evaluated by SLP. Recommendation continues to be for pureed diet with thin liquids, however the patient will have tube feeds to ensure adequate caloric intake and sufficient blood sugars until he is able to take a full diet by mouth.  Aspiration: The patient regurgitated and then aspirated tube feeding this evening. CXR pending. Tube feeds have been held. He has been starting on D5 at 100 cc/hr to maintain hydration and glucose.  Acute encephalopathy with Acute infarct on brain MRI- Improved with improved level of alertness and mobility. Patint verbally responsive this morning.  - management and further workup of seizure contribution management per neurology   Acute embolism and thrombosis of internal  jugular vein -Eliquis 2.5 mg twice daily as per Dr. Keturah Barre recommendations.      End-stage renal disease on hemodialysis -Nephrology following.  Dialysis as per nephrology schedule -Dialysis schedule changed from M WF to TTS   Anemia of chronic disease -From renal failure - follow with dialysis labs  H/o Hypertension. Currently Hypotension -Blood pressure currently stable.  Off amlodipine  Chronic combined CHF -LVEF of 30 to 35% as per TTE on 08/13/2021.  Volume managed by dialysis.  Strict input output.  Daily weights.  Fluid restriction  Depression- stable   Generalized deconditioning -PT/OT recommended. Social worker following.  I have seen and examined this patient myself. I have spent 38 minutes in his evaluation and care.   Goals of care Pt is a DNR. Patient's father consented to temporary tube feeds in our phone conversation on 09/04/2021.   DVT prophylaxis: apixaban (ELIQUIS) tablet 2.5 mg Start: 08/30/21 2200 apixaban (ELIQUIS) tablet 2.5 mg  Code Status: DNR Family Communications: None available   Nivia Gervase, DO [facility] Direct contact: see [email or web address]  7PM-7AM contact night coverage as above  09-20-2021, 7:03 PM  LOS: 1 day

## 2021-09-09 NOTE — Procedures (Signed)
Patient seen on Hemodialysis. Lethargic/somnolent and not interactive.  BP 118/60   Pulse 89   Temp 100.1 F (37.8 C) (Axillary)   Resp (!) 22   Wt 83.2 kg   SpO2 100%   BMI 24.20 kg/m   QB 400, UF goal 2L.   Elmarie Shiley MD [facility]. Office # [phone] Pager # [phone] 3:55 PM

## 2021-09-09 NOTE — Progress Notes (Signed)
    OVERNIGHT PROGRESS REPORT  Notified by RN that patient has expired at 2134  Patient was DNR, Palliative was on consult   2 RN verified.  Family was not immediately available to RN.They were contacted by telephone.    Gershon Cull MSNA ACNPC-AG Acute Care Nurse Practitioner [place]

## 2021-09-09 NOTE — Progress Notes (Addendum)
Subjective: No significant changes.   Exam: Vitals:   09/04/21 2355 2021-09-22 0439  BP: 109/60 (!) 106/51  Pulse: 96 100  Resp: 18 20  Temp: 99.7 F (37.6 C) 99.1 F (37.3 C)  SpO2: 100% 100%   Gen: In bed, NAD Resp: non-labored breathing, no acute distress Abd: soft, nt  Neuro: MS: Eyes are open, but to get his attention, I have to give a mild sternal rub. He then follwos commands to lift arms/legs and wiggle toes.  CN: Extraocular movements are intact, he does not cooperate with finger counting today.  Motor: He moves all extremities relatively symmetrically  Sensory: He endorses sensation bilaterally.   Pertinent Labs: BUN 62 yesterday  Impression: 60 year old male with ESRD on HD,htn, history of polysubstance abuse, on anticoagulation with spinal osteomyelitis, E faecalis bacteremia, endocarditis with encephalopathy.  His encephalopathy appears to be gradually improving, supporting a toxic metabolic etiology.  There has been significant concern for seizures, due to episodes of sudden mental status change and the finding later of focal slowing on EEG.  I would favor continuing levetiracetam at least in the short-term, though he likely does not need it long-term.  With some improvement in his mental status, I think his neurological prognosis is relatively uncertain, and therefore I would not favor basing decisions regarding withdrawal of care, etc. based solely on his neurological status, though with his comorbidities, will defer to primary team to discuss these issues based on his medical prognosis. Recovery might be prolnonged over weeks and could certainly end up being incomplete, but I feel that this is not clear at this time. I would also expect some degree of waxing/waning as is commonly seen with delirium. If he does not continue to make progress, and would not want prolonged dependency, then comfort measures may still end up being appropriate.   I agree that his strokes  are likely septic emboli, he is already anticoagulated and it is okay to continue this though no specific antithrombotic therapy is indicated for endocarditis related strokes.   Recommendations: 1) continue Keppra 500 mg twice daily 2) neurology will follow.   Roland Rack, MD [facility] [phone]  If 7pm- 7am, please page neurology on call as listed in [place].

## 2021-09-09 NOTE — Progress Notes (Signed)
Pharmacy Antibiotic Note  RYMAN RATHGEBER is a 60 y.o. male admitted on 09/02/2021 with E.faecalis endocarditis and osteomyelitis.  Pharmacy has been consulted for vancomycin dosing.  Previously on ampicillin and Rocephin.  Patient has a history of MSSA bacteremia in 2014 (no endocarditis), IJ vein thrombosis, admitted to [place] 8/30 with lower back pain found to have E.faecalis endocarditis, started on appropriate abx, course complicated by seizure, initially transitioned to hospice, but since revered to full medical care (dnr/dni), transferred to [facility] for further management in setting ongoing seizure.  Vancomycin random level at goal today at 17 mcg/ml, noted plans to stay on TTS schedule. Temp 99.7, WBC wnl.   Plan: Cont Vanc 1gm IV qHD TTS Monitor HD schedule/tolerance, vanc level as indicated   Weight: 89 kg (196 lb 3.4 oz)  Temp (24hrs), Avg:98.8 F (37.1 C), Min:97.5 F (36.4 C), Max:99.7 F (37.6 C)  Recent Labs  Lab 08/30/21 0420 09/02/21 0201 09/03/21 0815 09-09-2021 0247  WBC 9.4 9.5 10.5  --   CREATININE 7.61* 11.60* 9.18*  --   VANCORANDOM  --   --   --  17     Estimated Creatinine Clearance: 10 mL/min (A) (by C-G formula based on SCr of 9.18 mg/dL (H)).    Allergies  Allergen Reactions   Other     Other reaction(s): Other (See Comments) blister   Tape Other (See Comments)    Plastic Tape Causes blister    Ampicillin 9/3 >> 9/23 Ceftriaxone 9/3 >> 9/23 Vanc 9/23 >>   9/2 BCx - E.faecalis  Thank you for allowing pharmacy to be a part of this patient's care.  Alycia Rossetti, PharmD, BCPS Clinical Pharmacist Clinical phone for 09-Sep-2021: [phone] 2021-09-09 12:20 PM   **Pharmacist phone directory can now be found on [email or web address] ([email or web address]).  Listed under [facility].

## 2021-09-09 NOTE — Progress Notes (Signed)
Unresponsive to verbal /tactile stimuli, unable to palpate pulse. Asystole noted on tele monitor. Death was pronounced by this Probation officer and another RN on the unit. MD and family notified.  [employer] notified. Awaiting a call back from [facility].

## 2021-09-09 NOTE — Progress Notes (Signed)
Patient Hemodialysis treatment complete goal partially met. Once treatment ended patient began to cough then projectile vomit. We paused tube feeding then cleaned up patient. Vitals remained stable throughout. Once transport arrived this RN traveled back to room with patient vitals stable.

## 2021-09-09 DEATH — deceased

## 2021-09-21 ENCOUNTER — Inpatient Hospital Stay: Payer: Medicare Other | Admitting: Infectious Diseases

## 2021-10-10 NOTE — Discharge Summary (Addendum)
Physician Discharge Summary  [name] [name] WGN:[id number] DOB: 1961-11-26 DOA: September 04, 2021  PCP: Merryl Hacker, No  Admit date: September 04, 2021 21:34 Date of death: 10-Oct-2021  Recommendations for Outpatient Follow-up:  Patient is deceased.  Discharge Diagnoses: Principal diagnosis is #1 Enterococcus faecalis bacteremia Aortic Valve endocarditis Osteomyelitis in C5, C6 with discitis in L4-L5 Dysphagia Aspiration Acute encephalopathy with acute infarct on MRI brain 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. End-stage renal disease on hemodyalysis Anemia of chronic disease History of hypertension Hypotension due to sepsis Chronic combined CHF Depression Generalized deconditioning Hyperphosphatemia - Treated with renvela  Discharge Condition: Deceased  Disposition: Deceased  Diet recommendation: Deceased  Filed Weights   2021/09/08 1410 08-Sep-2021 1800 Sep 08, 2021 2134  Weight: 83.2 kg 80.7 kg 80.7 kg    History of present illness: [name] [name] is a 60 y.o. 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  He was transferred to [facility] from [facility] today after presenting there on 8/30 with back pain.   His neurologic status remained poor and so his parents made him DNR with comfort care status, but neurology recommended seizure medications based on EEG and he had some improvement neurologically and so they changed his status and he was transferred to [facility] for further neurologic evaluation.  He is currently responsive only to pain, with withdrawing and moans and nonsensical speech.  Hospital Course:  The patient is a 60 year old man who was transferred to [facility] on 09/04/21 for higher level of care and continuous EEG  monitoring. The patient himself is unable to provide a history. [name] [name] is a 60 y.o. 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  He was transferred to [facility] from [facility] today after presenting there on 8/30 with back pain.   His neurologic status remained poor and so his parents made him DNR with comfort care status, but neurology recommended seizure medications based on EEG and he had some improvement neurologically and so they changed his status and he was transferred to [facility] for further neurologic evaluation.  He is currently responsive only to pain, with withdrawing and moans and nonsensical speech.   The patient was on EEG for most of 09/02/2021. He underwent HD on that afternoon. After he returned to his room on 09/02/2021, the patient had improved level of alertness. He was oriented to self and is able to follow simple commands. Family is aware.   SLP has been asked to evaluate the patient for swallow recommendations. The patient was started on a pureed diet, however, nursing has reported that they were forced to stop trying to feed him last evening as the patient was pocketing and not swallowing food. The patient had a glucose of 55 this morning. Nutrition was consulted and recommended Cortrack for tube feeds until the patient is able to safely consume enough by mouth. SLP re-evaluated today and continues to recommend pureed diet with thin liquids.   Cortrack tube was placed and feeds were being slowly titrated up. At roughly  1800 this evening the patient apparently vomited tube feeding and aspirated. According to nursing he is saturating 100% on room air, but has a respiratory rate in the high twenties. Heart rate is 113.  Tube feeds have been stopped, and the patient has been placed on D5 to maintain hydration and glucose. CXR is pending.  CXR  was obtained. It demonstrated Support lines and tubes present and stable. Stable right-sided volume loss and small right pleural effusion or pleural thickening. Pulmonary hypoinflation with bibasilar atelectasis. Stable cardiomegaly.  The patient was declared expired at 21:34 on 09-16-21  Today's assessment:  Please see progress noter dated 16-Sep-2021 for last physical exam prior to death. O: Vitals:  Vitals:   Sep 16, 2021 1838 2021/09/16 1936  BP: 116/72 118/67  Pulse: (!) 113 (!) 110  Resp: (!) 33 (!) 26  Temp: 98.7 F (37.1 C) 99.8 F (37.7 C)  SpO2: 100% 100%    Allergies as of 09/06/2021       Reactions   Other    Other reaction(s): Other (See Comments) blister   Tape Other (See Comments)   Plastic Tape Causes blister        Medication List     ASK your doctor about these medications    ampicillin  IVPB Inject 2 g into the vein every 12 (twelve) hours. Indication:  Enterococcus faecalis Bacteremia, lumbar discitis and endocarditis Endocarditis First Dose: Yes Last Day of Therapy:  09/23/21 Labs - Once weekly:CBC with differential and CMP  Line will have to be removed at the end of antibiotic treatment Fax weekly labs to  Dr.Ravishankar [phone] Method of administration may be changed at the discretion of home infusion pharmacist based upon assessment of the patient and/or caregiver's ability to self-administer the medication ordered. Ask about: Should I take this medication?   apixaban 2.5 MG Tabs tablet Commonly known as: ELIQUIS Take 1 tablet (2.5 mg total) by mouth 2 (two) times daily.   cefTRIAXone  IVPB Commonly known as: ROCEPHIN Inject 2 g into the vein every 12 (twelve) hours. Indication:   Enterococcus faecalis Bacteremia, lumbar discitis and endocarditis Endocarditis First Dose: Yes Last Day of Therapy:  09/23/21 Labs -  Once weekly:CBC with differential and CMP  Line will have to be removed at the end of antibiotic treatment Fax weekly labs to  [facility] [phone] Method of administration: IV Push Method of administration may be changed at the discretion of home infusion pharmacist based upon assessment of the patient and/or caregiver's ability to self-administer the medication ordered. Ask about: Should I take this medication?   lidocaine 5 % Commonly known as: LIDODERM Place 2 patches onto the skin daily. Remove & Discard patch within 12 hours or as directed by MD   methocarbamol 500 MG tablet Commonly known as: ROBAXIN Take 1 tablet (500 mg total) by mouth every 6 (six) hours as needed for muscle spasms.   oxyCODONE-acetaminophen 5-325 MG tablet Commonly known as: PERCOCET/ROXICET Take 1 tablet by mouth every 6 (six) hours as needed for moderate pain.   polyethylene glycol 17 g packet Commonly known as: MIRALAX / GLYCOLAX Take 17 g by mouth daily as needed for moderate constipation.   senna-docusate 8.6-50 MG tablet Commonly known as: Senokot-S Take 1 tablet by mouth 2 (two) times daily.   sevelamer carbonate 800 MG tablet Commonly known as: RENVELA Take 2 tablets (1,600 mg total) by mouth 3 (three) times daily with meals.       Allergies  Allergen Reactions  Other     Other reaction(s): Other (See Comments) blister   Tape Other (See Comments)    Plastic Tape Causes blister    The results of significant diagnostics from this hospitalization (including imaging, microbiology, ancillary and laboratory) are listed below for reference.    Significant Diagnostic Studies: No results found.  Microbiology: No results found for this or any previous visit (from the past 240 hour(s)).   Labs: Basic Metabolic Panel: No results for input(s): NA, K, CL, CO2, GLUCOSE, BUN, CREATININE, CALCIUM, MG, PHOS in the last 168 hours. Liver Function Tests: No results for input(s): AST,  ALT, ALKPHOS, BILITOT, PROT, ALBUMIN in the last 168 hours. No results for input(s): LIPASE, AMYLASE in the last 168 hours. No results for input(s): AMMONIA in the last 168 hours. CBC: No results for input(s): WBC, NEUTROABS, HGB, HCT, MCV, PLT in the last 168 hours. Cardiac Enzymes: No results for input(s): CKTOTAL, CKMB, CKMBINDEX, TROPONINI in the last 168 hours. BNP: BNP (last 3 results) No results for input(s): BNP in the last 8760 hours.  ProBNP (last 3 results) No results for input(s): PROBNP in the last 8760 hours.  CBG: No results for input(s): GLUCAP in the last 168 hours.  Principal Problem:   Endocarditis Active Problems:   End-stage renal disease on hemodialysis (HCC)   Polysubstance abuse ([place])   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 (HCC)   Chronic combined systolic and diastolic CHF (congestive heart failure) (HCC)   Goals of care, counseling/discussion   DNR (do not resuscitate)   Vertebral osteomyelitis ([place])   Paraspinal abscess (HCC)   Protein-calorie malnutrition, severe   Time coordinating discharge: 38 minutes.  Signed:        Halden Phegley, DO [facility]  10/07/2021, 1:50 PM x

## 2022-12-15 IMAGING — CT CT L SPINE W/O CM
3 series · 11 of 35 positions shown, 13 images · non-contrast
Comparison: None.

CLINICAL DATA: Low back pain, increased fracture risk

EXAM:
CT LUMBAR SPINE WITHOUT CONTRAST
TECHNIQUE: Multidetector CT imaging of the lumbar spine was performed without
intravenous contrast administration. Multiplanar CT image
reconstructions were also generated.

[Series 4: l spine soft · axial · 0.36mm/px · z∈[-330,-140]mm · 3 of 155 slices shown, 4 images]
[im 36/155  soft-tissue]
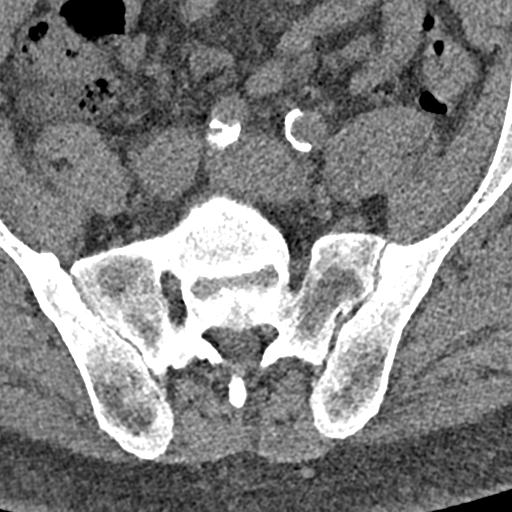
[im 36/155  bone]
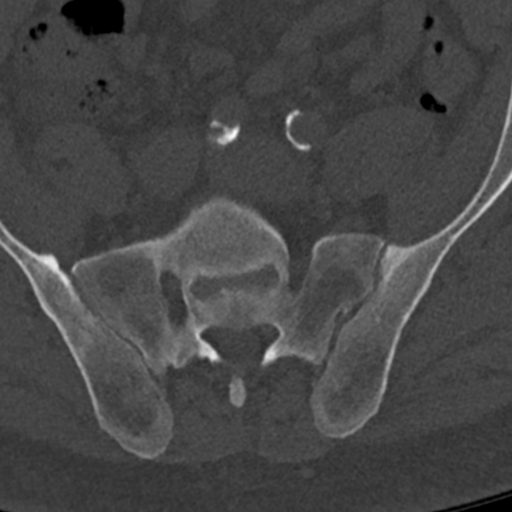
[im 83/155  bone]
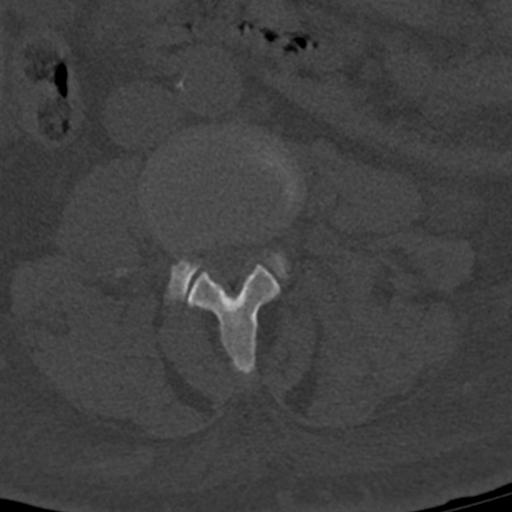
[im 131/155  bone]
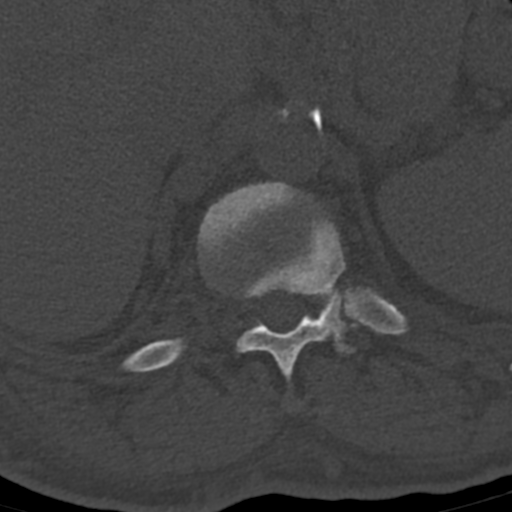

[Series 6: sagittal st · sagittal · 0.40mm/px · 5 of 86 slices shown, 6 images]
[im 29/86  bone]
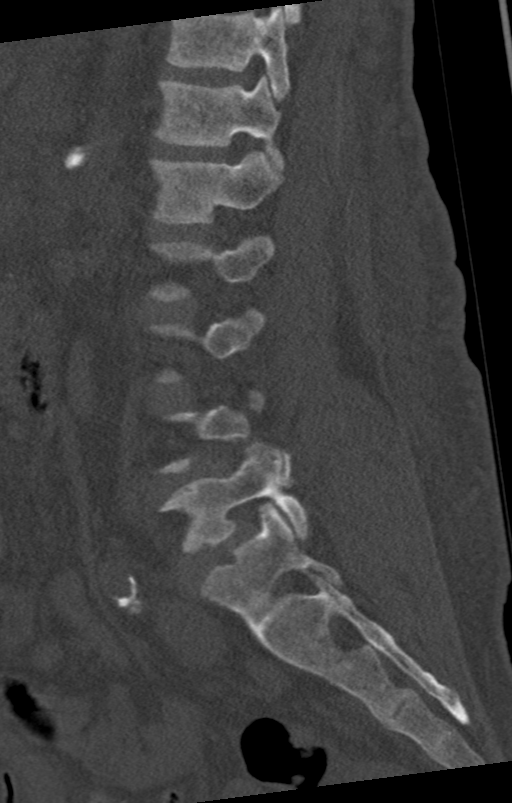
[im 36/86  bone]
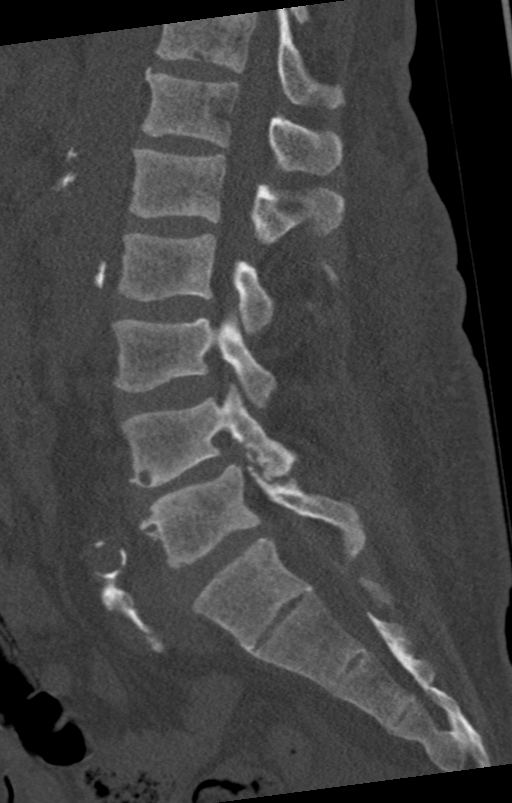
[im 43/86  soft-tissue]
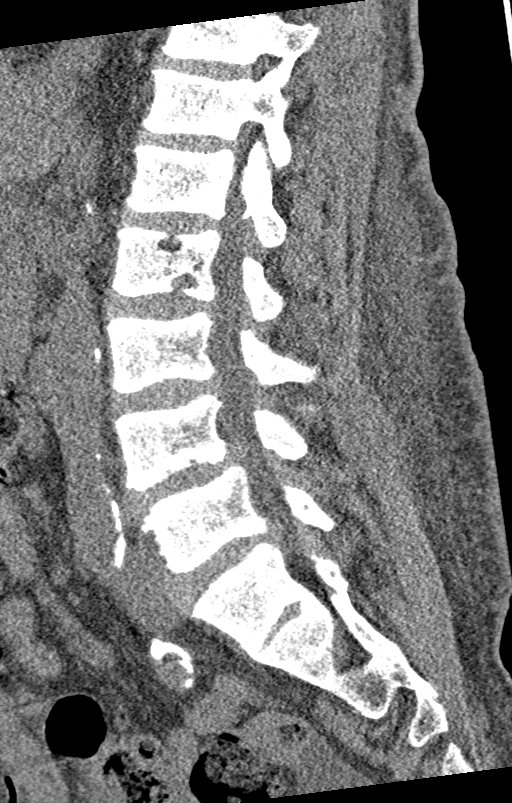
[im 43/86  bone]
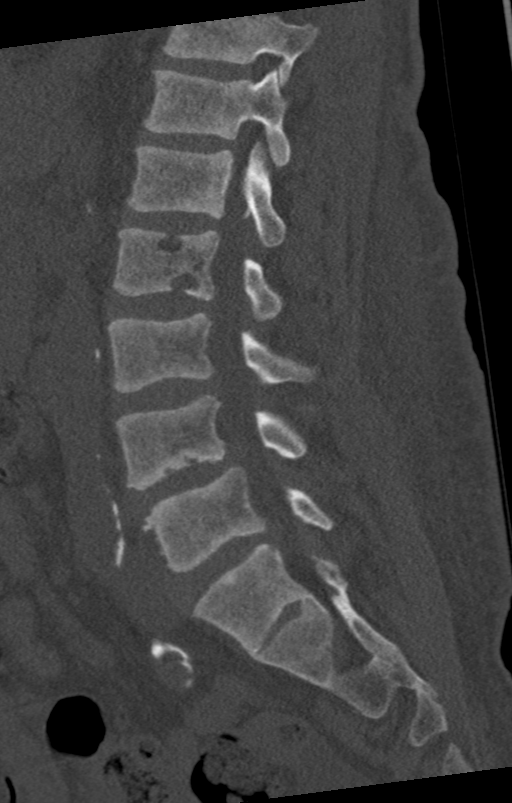
[im 50/86  bone]
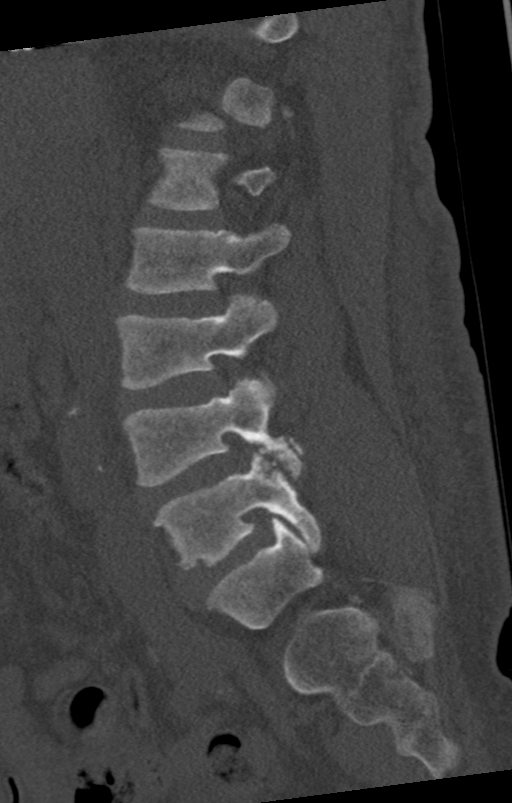
[im 57/86  bone]
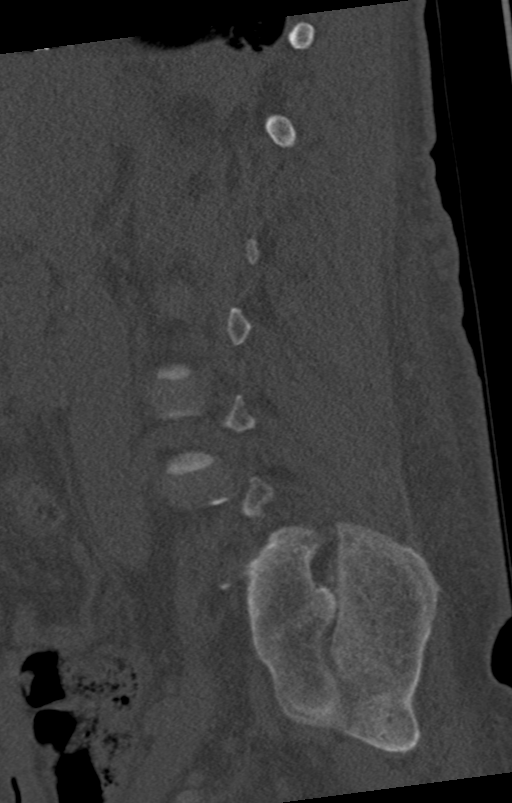

[Series 7: coronal bone · coronal · 0.33mm/px · 3 of 103 slices shown]
[im 21/103  bone]
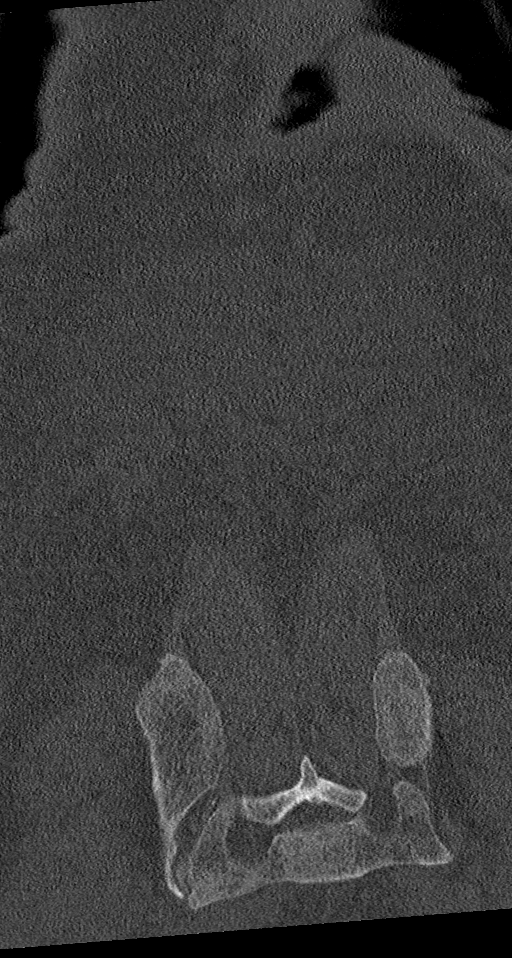
[im 41/103  bone]
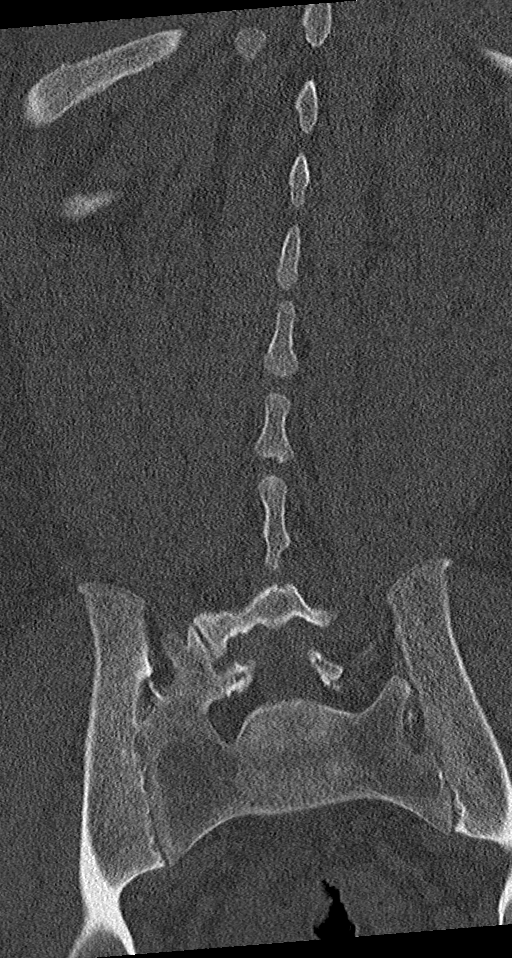
[im 62/103  bone]
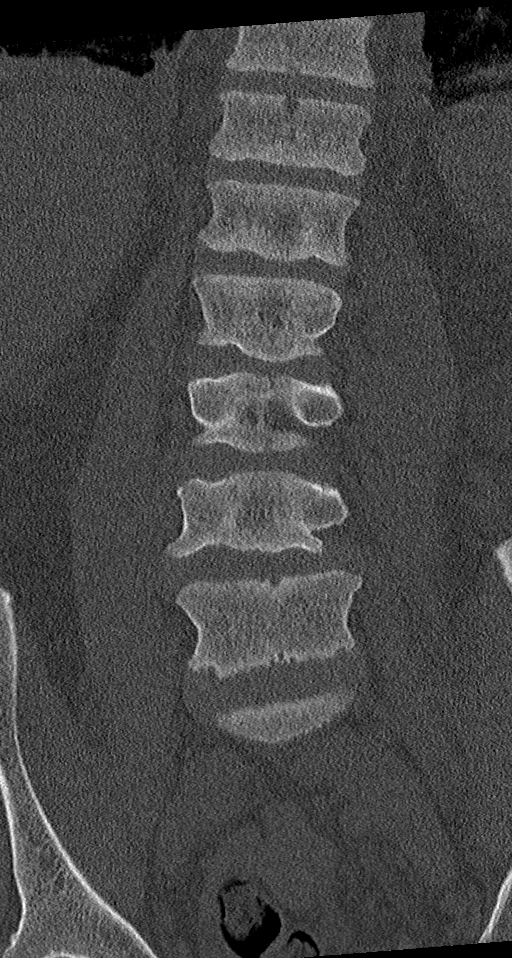

[11 of 35 positions shown; findings below may reference images not displayed]

FINDINGS: Segmentation: 5 lumbar type vertebrae.

Alignment: Grade 1 anterolisthesis at L4-L5.

Vertebrae: Diffusely increased density likely reflecting renal
osteodystrophy. Degenerative plate irregularity and Schmorl's nodes.
Vertebral body heights are unchanged.

Paraspinal and other soft tissues: Partially imaged small volume
free fluid in the dependent pelvis. Colonic diverticulosis. Atrophic
horseshoe kidney with multiple cysts. Aortic atherosclerosis.
Multiple collateral vessels in the posterior abdominal wall.

Disc levels:

L1-L2: Facet hypertrophy. No significant canal or foraminal
stenosis.

L2-L3: Disc bulge. Facet hypertrophy. No significant canal or
foraminal stenosis.

L3-L4: Disc bulge. Facet hypertrophy. No significant canal or
foraminal stenosis.

L4-L5: Anterolisthesis with uncovering of disc bulge. Facet
hypertrophy. Ligamentum flavum thickening. Probable moderate canal
stenosis. Bilateral foraminal stenosis, left greater than right.

L5-S1: Disc bulge with endplate osteophytic ridging. Facet
hypertrophy. Ligamentum flavum thickening. No significant canal
stenosis. Bilateral foraminal stenosis.
IMPRESSION: No acute osseous abnormality. Multilevel degenerative changes as
detailed above. Canal and foraminal stenosis are greatest at L4-L5.

Partially imaged nonspecific small volume free fluid in the
dependent pelvis.

## 2022-12-18 IMAGING — DX DG CHEST 1V
2 series · 2 of 2 positions shown · non-contrast
Comparison: 04/16/2015 and 08/18/2013 radiographs.  CT 03/06/2013.

CLINICAL DATA: Kidney damage. End-stage renal disease on
hemodialysis.

EXAM:
CHEST  1 VIEW

[chest ap (1 of 2)]
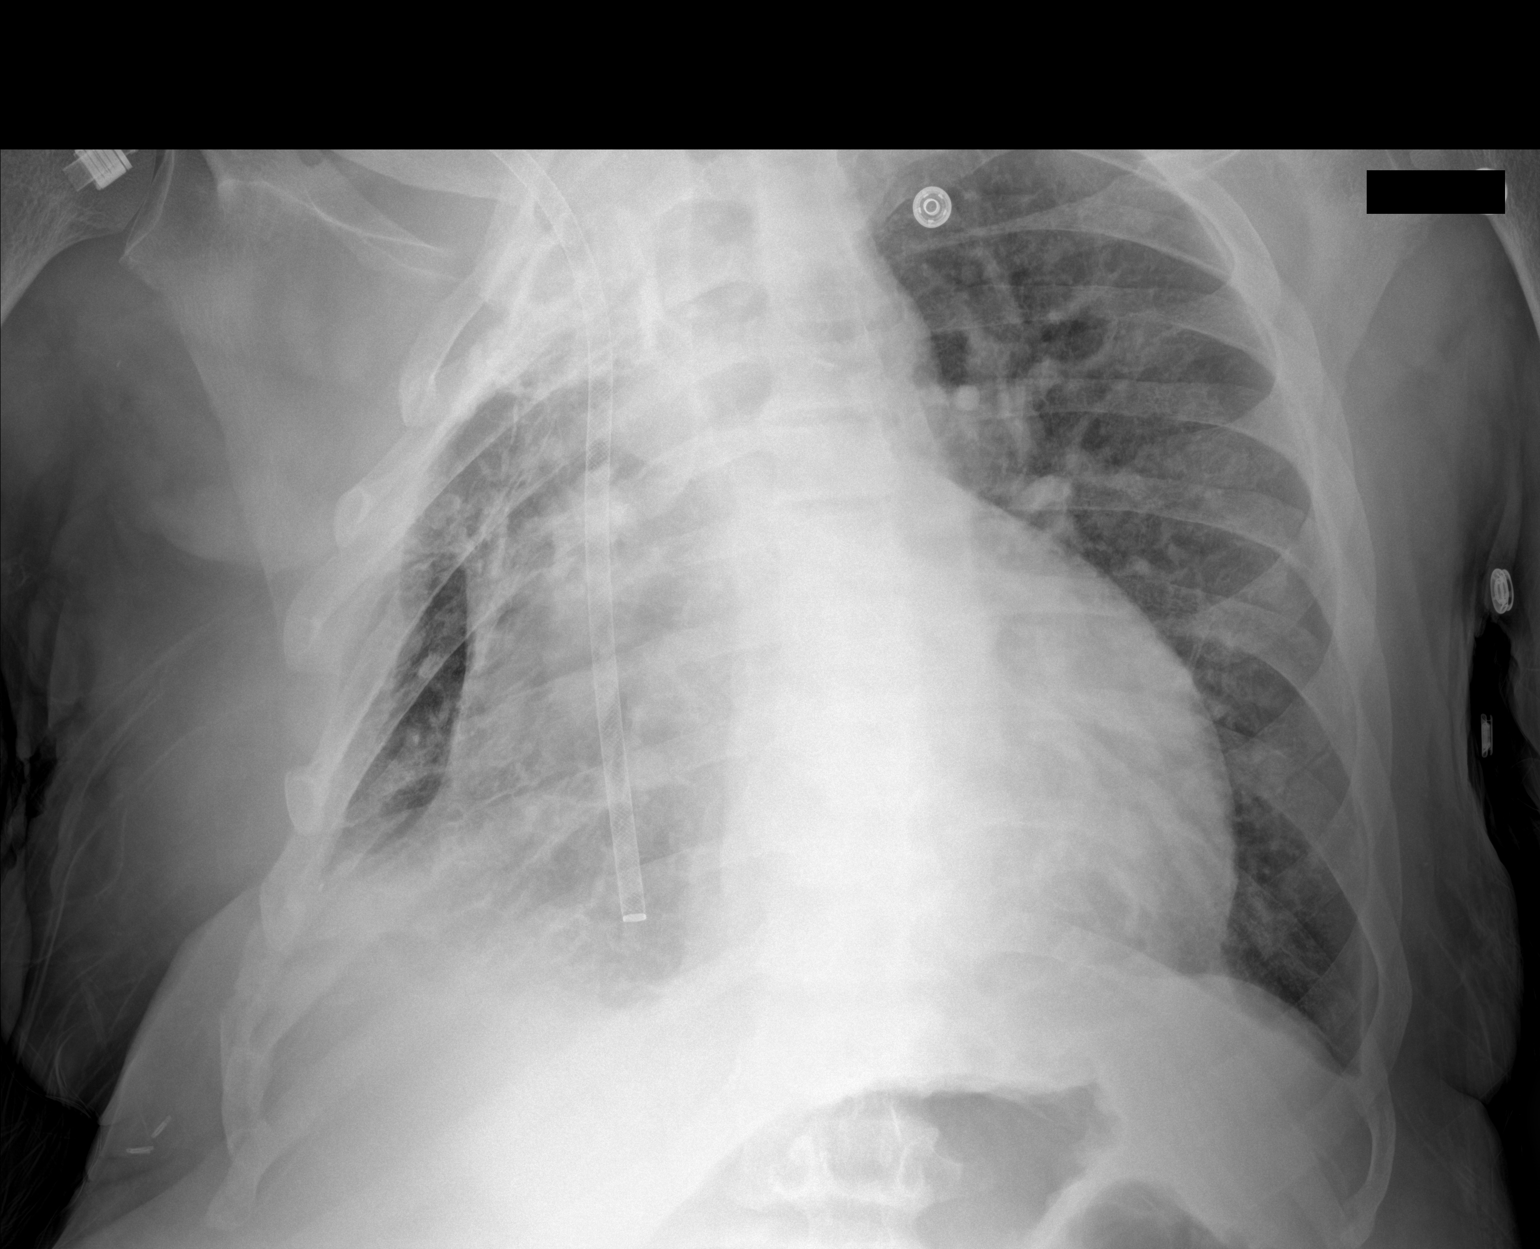

[chest ap (2 of 2)]
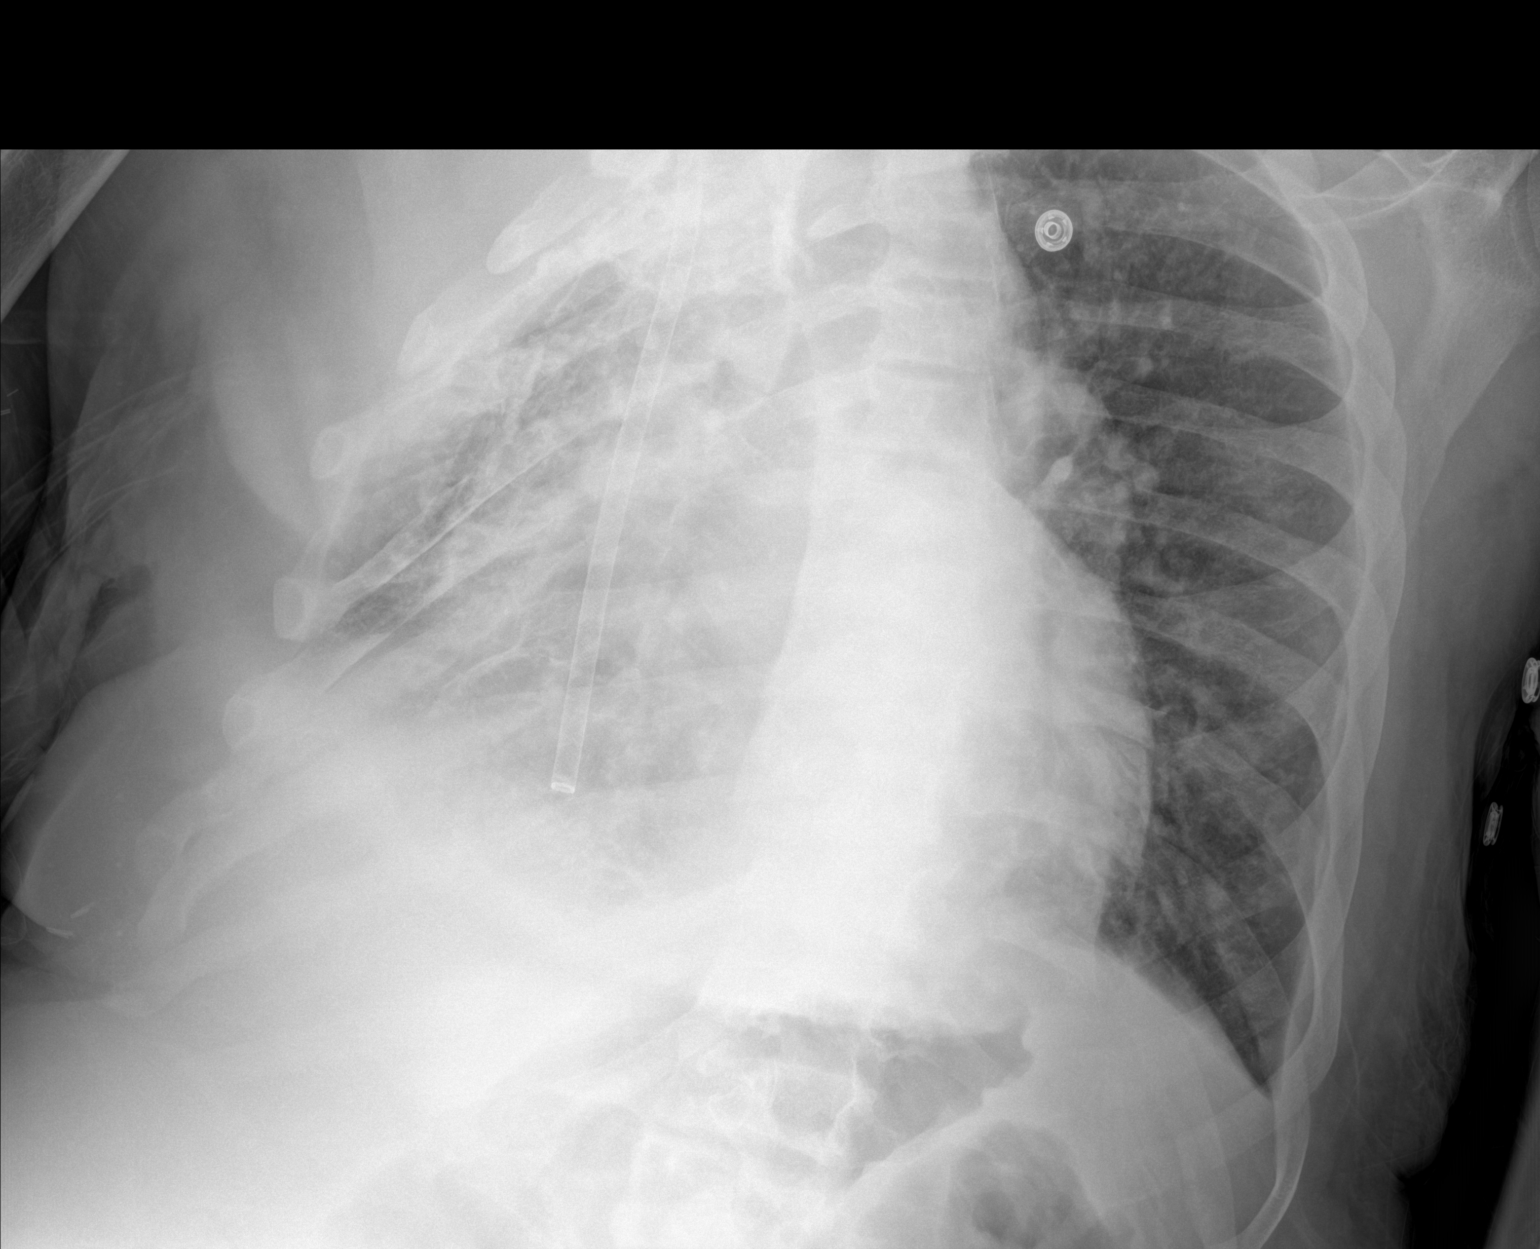

[2 of 2 positions shown; findings below may reference images not displayed]

FINDINGS: 7930 hours. Two views obtained. Mild patient rotation to the right.
Right IJ hemodialysis catheters project to the level of the inferior
right atrium. There is progressive cardiac enlargement. There is
chronic volume loss and pleural thickening in the right hemithorax
which appears similar to the prior studies allowing for the
rotation. The left lung is clear. There is no pneumothorax or
significant pleural effusion. The bones appear unchanged.
IMPRESSION: No evidence of acute cardiopulmonary process. Stable chronic volume
loss and pleuroparenchymal scarring in the right hemithorax.

## 2022-12-19 IMAGING — MR MR CERVICAL SPINE W/O CM
10 of 11 series · 36 of 48 positions shown · non-contrast
Comparison: Lumbar spine CT 08/08/2021.

CLINICAL DATA: Neck pain, acute.  Infection suspected.  Sepsis.

EXAM:
MRI CERVICAL AND THORACIC SPINE WITHOUT CONTRAST
TECHNIQUE: Multiplanar and multiecho pulse sequences of the cervical spine, to
include the craniocervical junction and cervicothoracic junction,
and the thoracic spine, were obtained without intravenous contrast.

[Series 17: FLAIR · sagittal · 3.0mm · 0.62mm/px · 3 of 8 slices shown (1 of 2)]
[im 1/8]
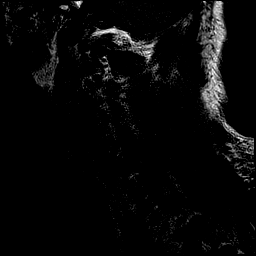
[im 4/8]
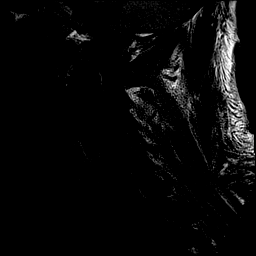
[im 8/8]
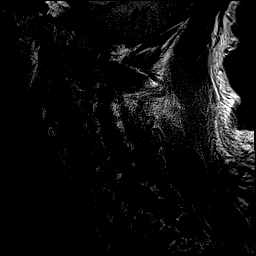

[Series 20: T2 · sagittal · 3.0mm · 0.50mm/px · 3 of 15 slices shown (1 of 4)]
[im 1/15]
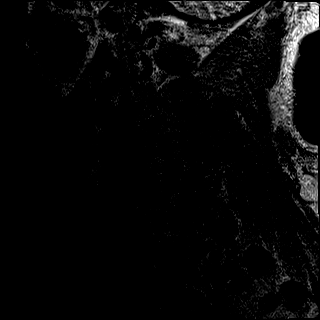
[im 8/15]
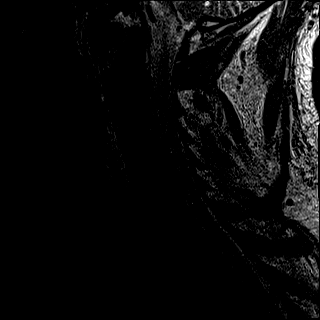
[im 15/15]
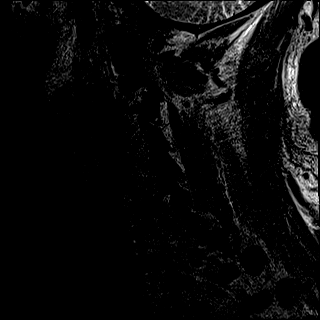

[Series 22: STIR · sagittal · 3.0mm · 0.50mm/px · 3 of 15 slices shown (1 of 2)]
[im 1/15]
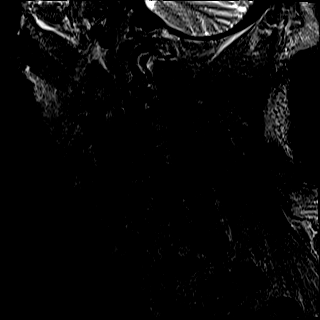
[im 8/15]
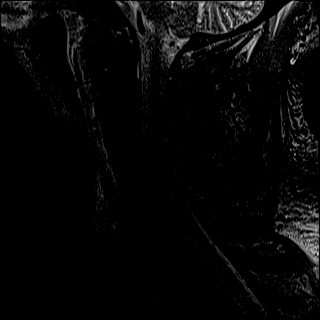
[im 15/15]
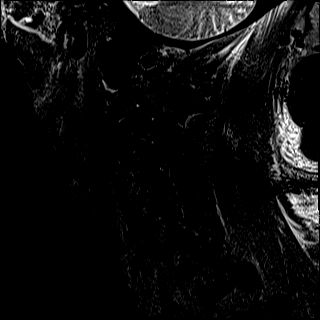

[Series 24: T2 · sagittal · 3.0mm · 0.50mm/px · 3 of 15 slices shown (2 of 4)]
[im 1/15]
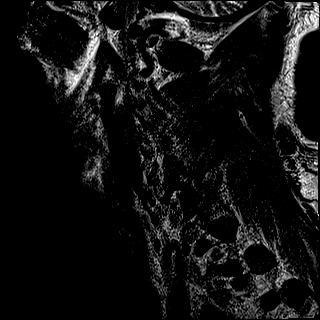
[im 8/15]
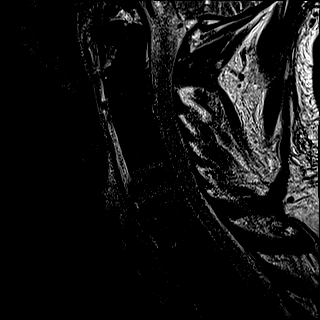
[im 15/15]
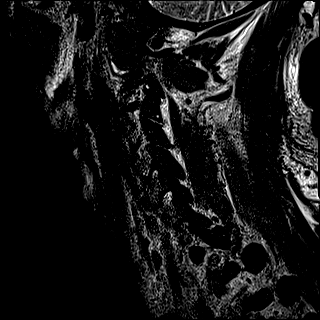

[Series 45: T2 · sagittal · 3.0mm · 0.50mm/px · 3 of 15 slices shown (3 of 4)]
[im 1/15]
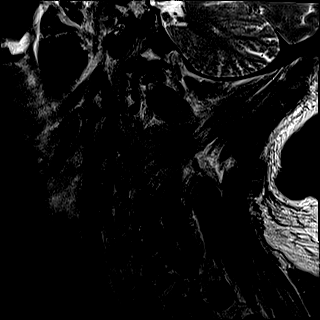
[im 8/15]
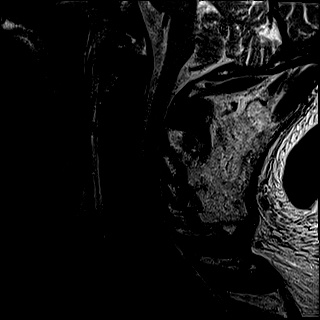
[im 15/15]
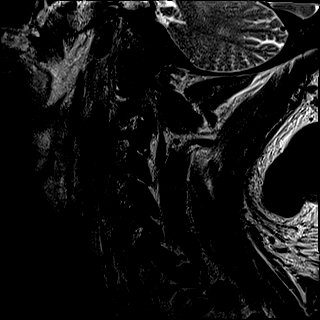

[Series 46: STIR · sagittal · 3.0mm · 0.53mm/px · 3 of 15 slices shown (2 of 2)]
[im 1/15]
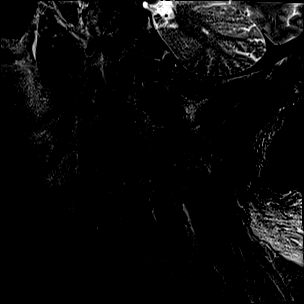
[im 8/15]
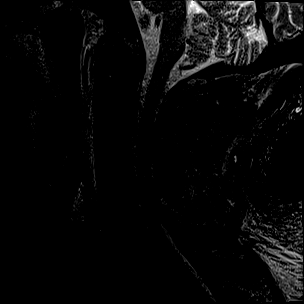
[im 15/15]
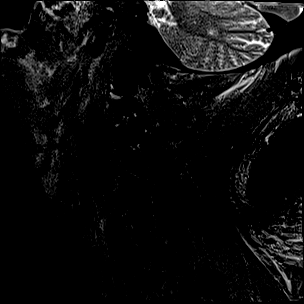

[Series 47: FLAIR · sagittal · 3.0mm · 0.56mm/px · 3 of 15 slices shown (2 of 2)]
[im 1/15]
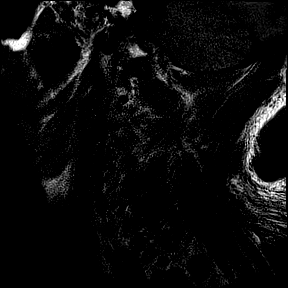
[im 8/15]
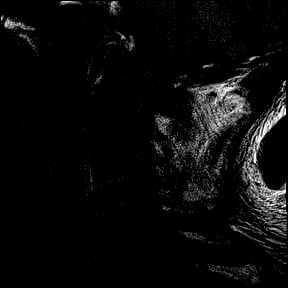
[im 15/15]
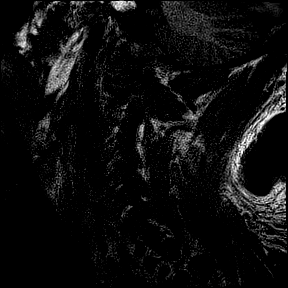

[Series 48: T2 · axial · 3.0mm · 0.70mm/px · z∈[-256,-164]mm · 6 of 28 slices shown (4 of 4)]
[im 1/28]
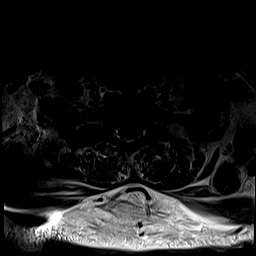
[im 6/28]
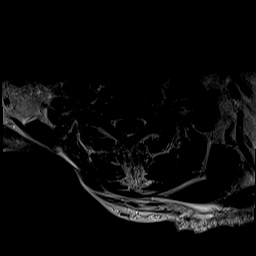
[im 11/28]
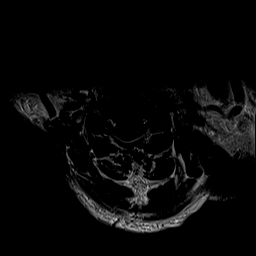
[im 17/28]
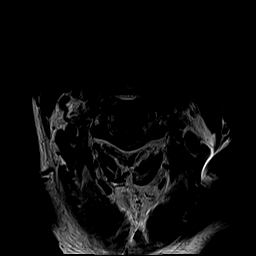
[im 22/28]
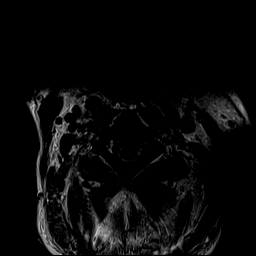
[im 28/28]
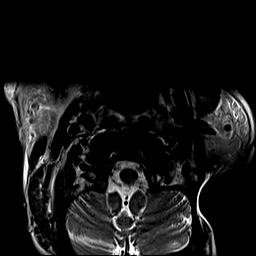

[Series 49: csp ax mpgr · axial · 3.0mm · 0.35mm/px · z∈[-256,-243]mm · 2 of 29 slices shown]
[im 1/29]
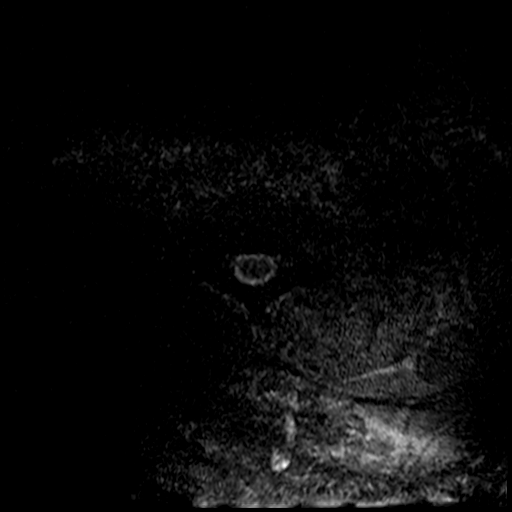
[im 5/29]
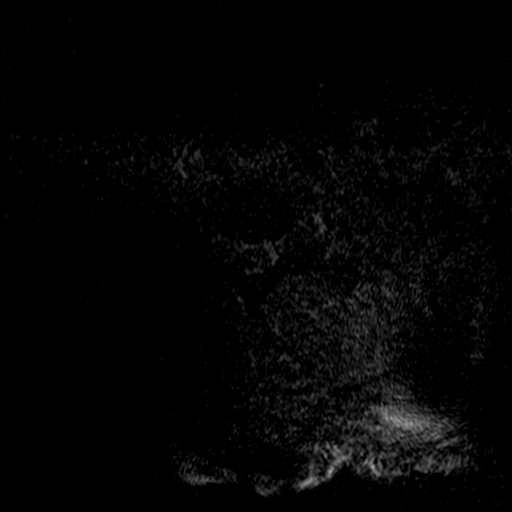

[Series 50: T1 · axial · non-contrast · 3.0mm · 0.35mm/px · z∈[-256,-164]mm · 7 of 29 slices shown]
[im 1/29]
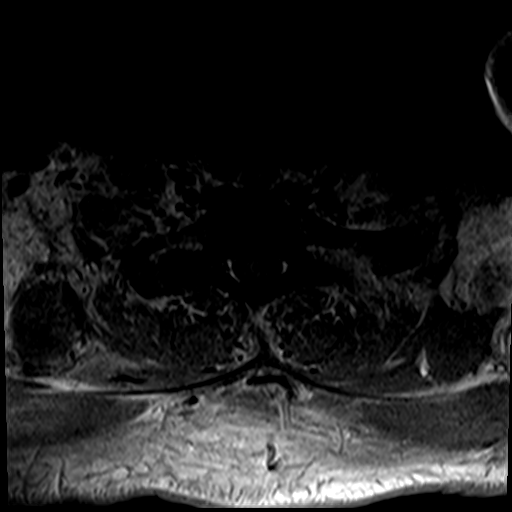
[im 5/29]
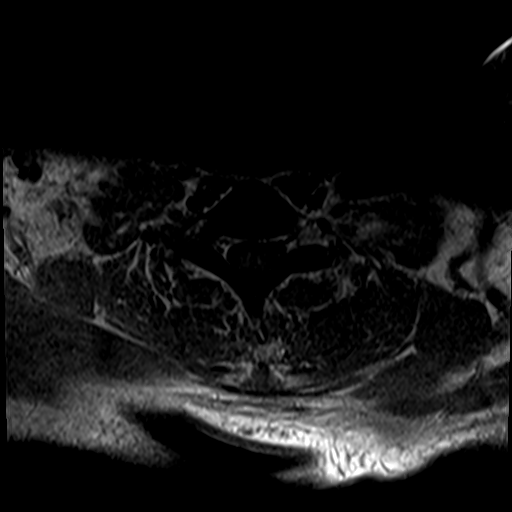
[im 10/29]
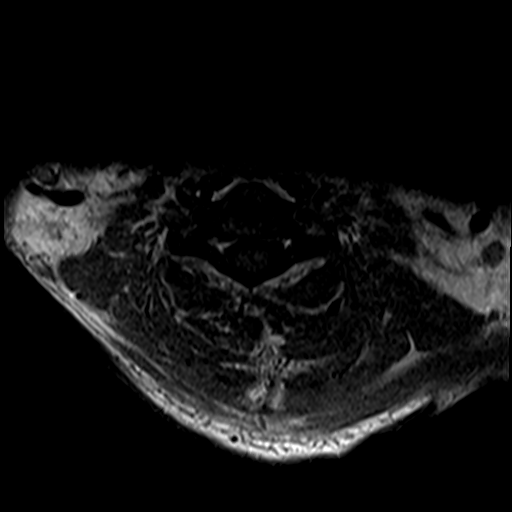
[im 15/29]
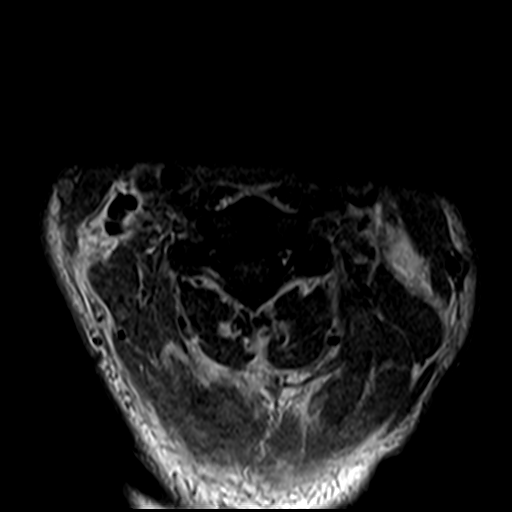
[im 19/29]
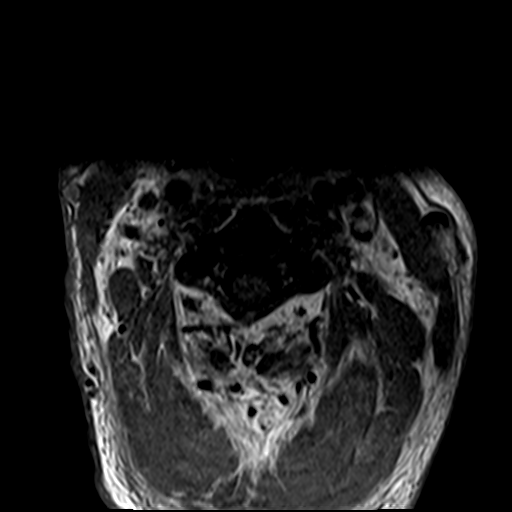
[im 24/29]
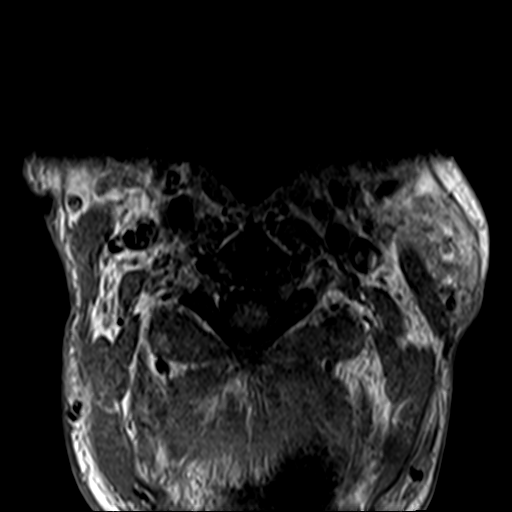
[im 29/29]
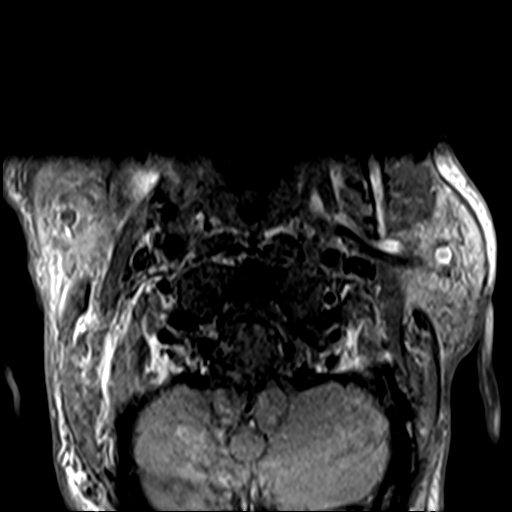

[36 of 48 positions shown; findings below may reference images not displayed]

FINDINGS: MRI CERVICAL SPINE FINDINGS

Alignment: Unremarkable

Vertebrae: Fatty marrow conversion at C5-6. No visible marrow
inflammation, acute fracture, or aggressive bone lesion.

Cord: No visible swelling or edema. No evidence of spinal
collection.

Posterior Fossa, vertebral arteries, paraspinal tissues: Mild
prevertebral/retropharyngeal edema without visible source.

Disc levels:

Focal disc degeneration at C6-7 with chronic inferior endplate
fracture or Schmorl's node at C5. Mild or moderate left foraminal
narrowing at this level from disc bulging and uncovertebral ridging.

MRI THORACIC SPINE FINDINGS

Alignment:  Unremarkable

Vertebrae: No visible marrow edema, fracture, or discitis. L2
inferior endplate Schmorl's node fatty marrow conversion. Diffusely
low marrow signal.

Cord: No visible intrathecal collection. There are pleural effusions
bilaterally.

Paraspinal and other soft tissues: Prominent paravertebral T2
hyperintensity which is presumably fat that extends into the
bilateral subpleural space.

Disc levels:

No visible herniation or impingement.
IMPRESSION: 1. Mild prevertebral/retropharyngeal edema without visible
underlying osteomyelitis or septic arthritis. If
persistent/progressive symptoms please have low threshold for
follow-up given the suboptimal study quality.
2. Generally abnormal marrow signal needing correlation with CBC.

## 2022-12-19 IMAGING — MR MR THORACIC SPINE W/O CM
5 series · 41 of 48 positions shown · non-contrast
Comparison: Lumbar spine CT 08/08/2021.

CLINICAL DATA: Neck pain, acute.  Infection suspected.  Sepsis.

EXAM:
MRI CERVICAL AND THORACIC SPINE WITHOUT CONTRAST
TECHNIQUE: Multiplanar and multiecho pulse sequences of the cervical spine, to
include the craniocervical junction and cervicothoracic junction,
and the thoracic spine, were obtained without intravenous contrast.

[Series 16: T1 · sagittal · 5.0mm · 1.88mm/px · 5 of 9 slices shown (1 of 2)]
[im 1/9]
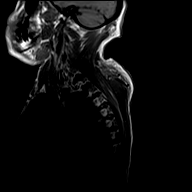
[im 3/9]
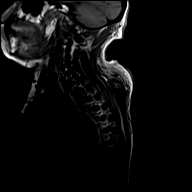
[im 5/9]
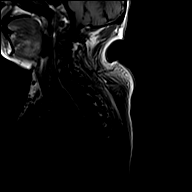
[im 7/9]
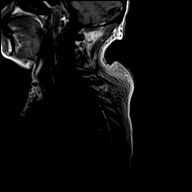
[im 9/9]
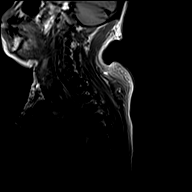

[Series 17: T2 · sagittal · 3.0mm · 1.33mm/px · 8 of 17 slices shown (1 of 2)]
[im 1/17]
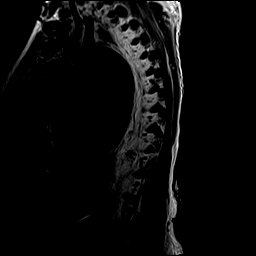
[im 3/17]
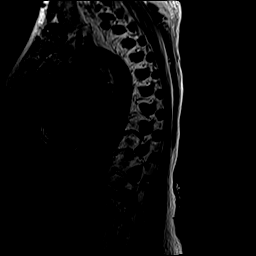
[im 5/17]
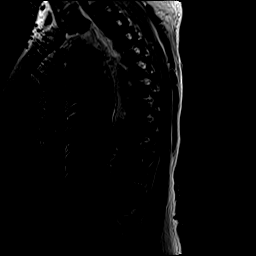
[im 7/17]
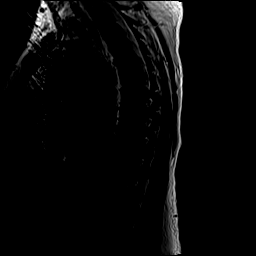
[im 10/17]
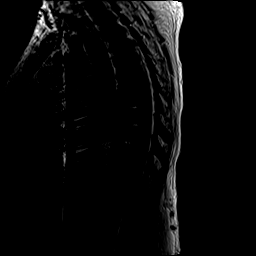
[im 12/17]
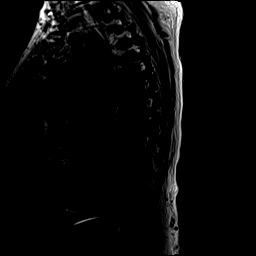
[im 14/17]
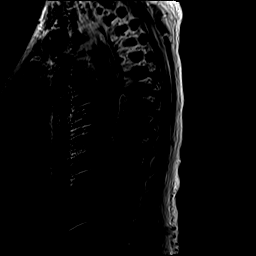
[im 17/17]
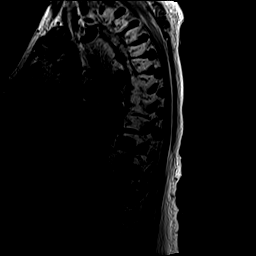

[Series 18: T1 · sagittal · 3.0mm · 1.33mm/px · 8 of 17 slices shown (2 of 2)]
[im 1/17]
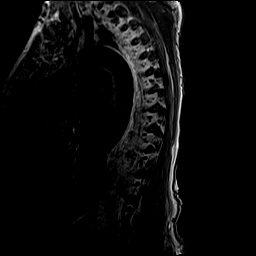
[im 3/17]
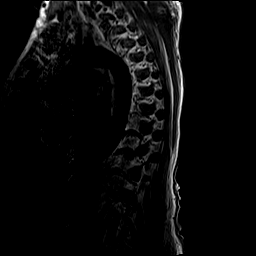
[im 5/17]
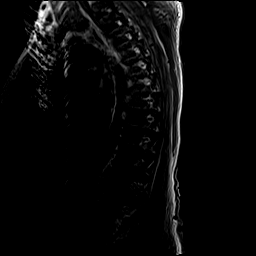
[im 7/17]
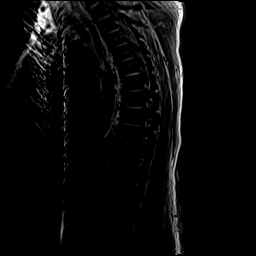
[im 10/17]
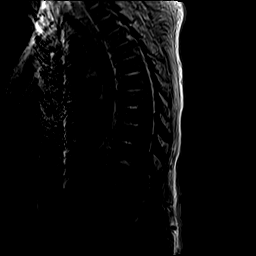
[im 12/17]
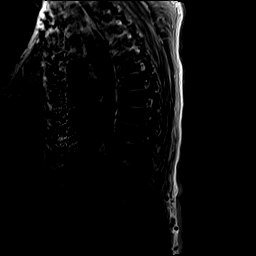
[im 14/17]
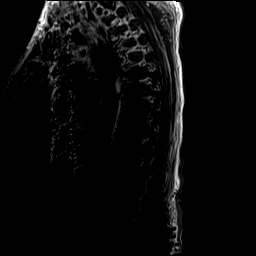
[im 17/17]
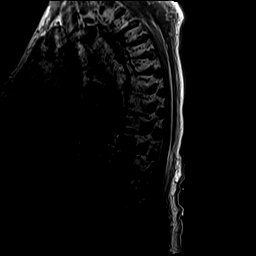

[Series 19: STIR · sagittal · 3.0mm · 0.66mm/px · 8 of 17 slices shown]
[im 1/17]
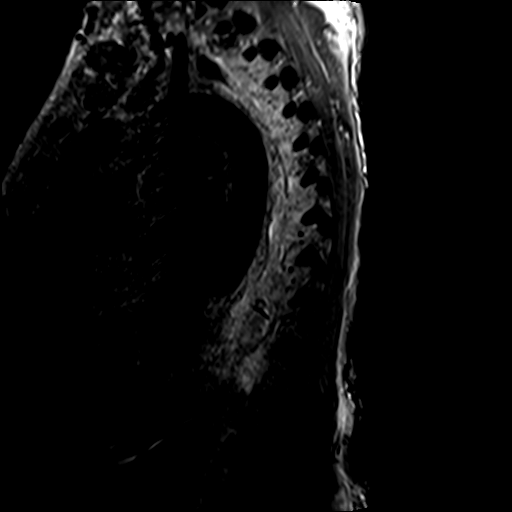
[im 3/17]
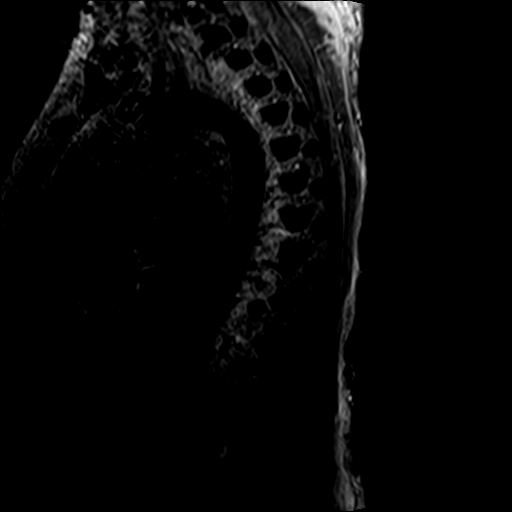
[im 5/17]
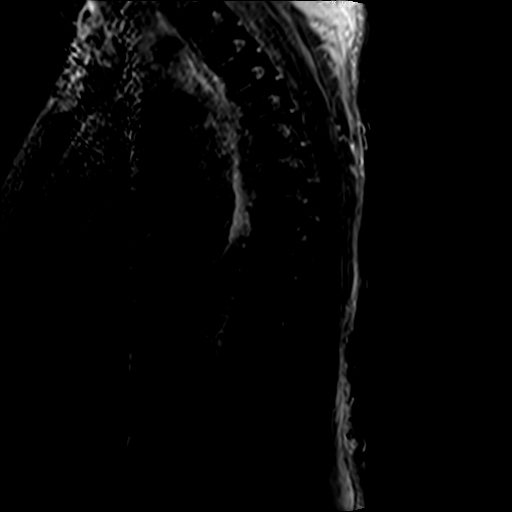
[im 7/17]
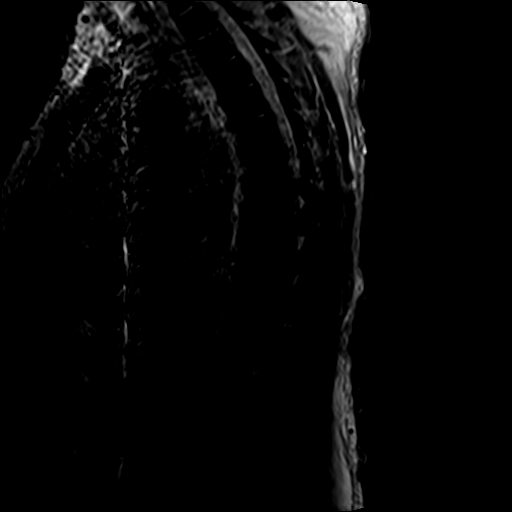
[im 10/17]
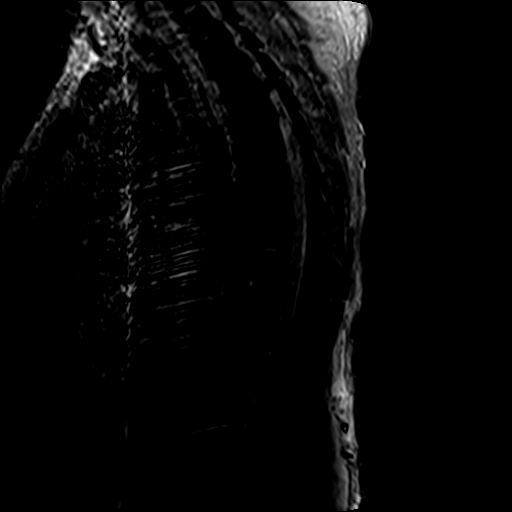
[im 12/17]
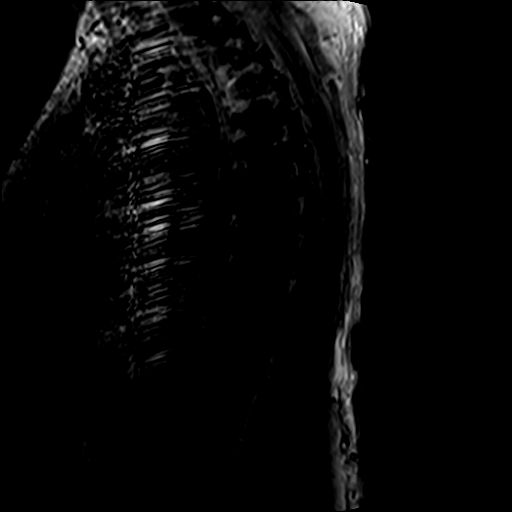
[im 14/17]
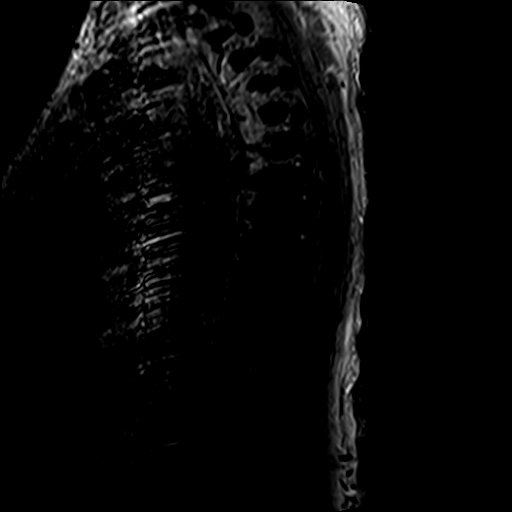
[im 17/17]
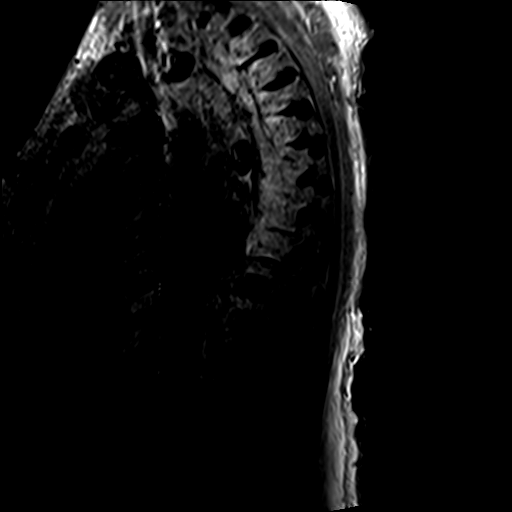

[Series 20: T2 · axial · 4.0mm · 0.59mm/px · z∈[-499,-276]mm · 12 of 39 slices shown (2 of 2)]
[im 1/39]
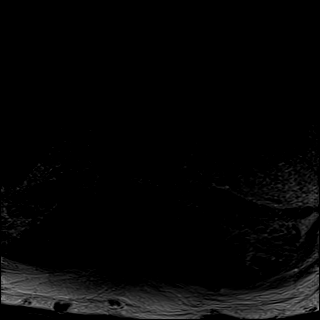
[im 3/39]
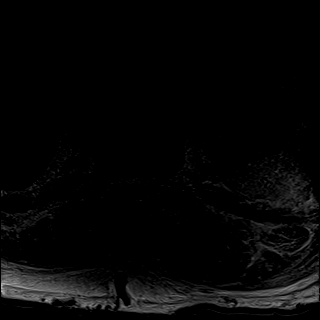
[im 5/39]
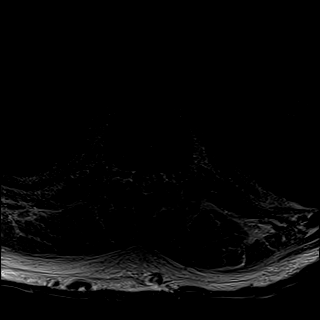
[im 7/39]
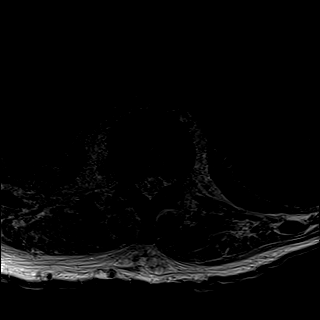
[im 9/39]
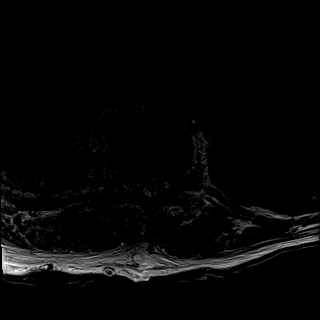
[im 11/39]
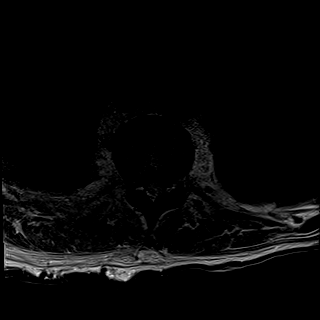
[im 17/39]
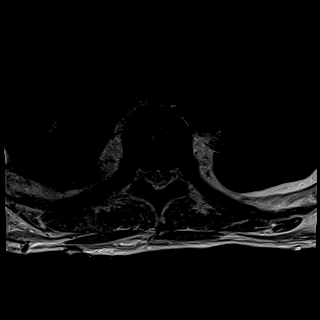
[im 20/39]
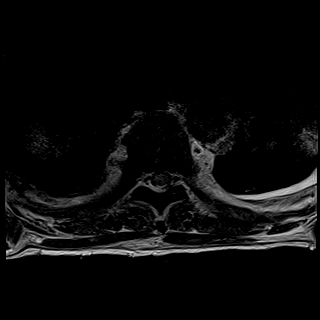
[im 22/39]
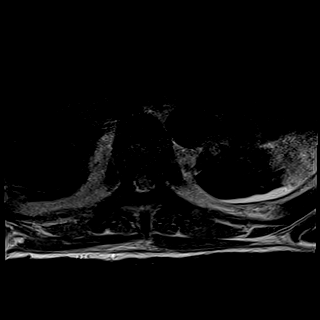
[im 28/39]
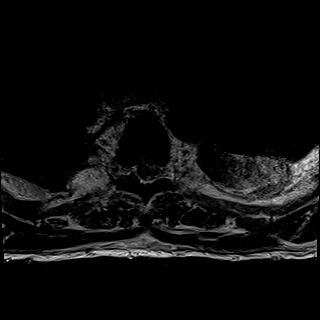
[im 32/39]
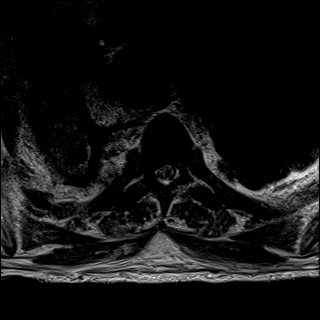
[im 36/39]
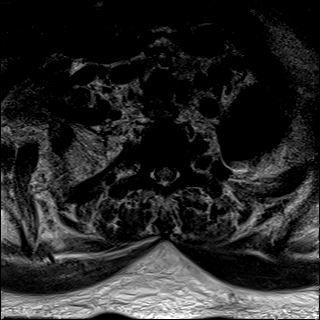

[41 of 48 positions shown; findings below may reference images not displayed]

FINDINGS: MRI CERVICAL SPINE FINDINGS

Alignment: Unremarkable

Vertebrae: Fatty marrow conversion at C5-6. No visible marrow
inflammation, acute fracture, or aggressive bone lesion.

Cord: No visible swelling or edema. No evidence of spinal
collection.

Posterior Fossa, vertebral arteries, paraspinal tissues: Mild
prevertebral/retropharyngeal edema without visible source.

Disc levels:

Focal disc degeneration at C6-7 with chronic inferior endplate
fracture or Schmorl's node at C5. Mild or moderate left foraminal
narrowing at this level from disc bulging and uncovertebral ridging.

MRI THORACIC SPINE FINDINGS

Alignment:  Unremarkable

Vertebrae: No visible marrow edema, fracture, or discitis. L2
inferior endplate Schmorl's node fatty marrow conversion. Diffusely
low marrow signal.

Cord: No visible intrathecal collection. There are pleural effusions
bilaterally.

Paraspinal and other soft tissues: Prominent paravertebral T2
hyperintensity which is presumably fat that extends into the
bilateral subpleural space.

Disc levels:

No visible herniation or impingement.
IMPRESSION: 1. Mild prevertebral/retropharyngeal edema without visible
underlying osteomyelitis or septic arthritis. If
persistent/progressive symptoms please have low threshold for
follow-up given the suboptimal study quality.
2. Generally abnormal marrow signal needing correlation with CBC.

## 2022-12-20 IMAGING — MR MR LUMBAR SPINE WO/W CM
5 of 7 series · 30 of 48 positions shown · IV contrast (9ml Gadavist)
Comparison: Comparison made with previous MRI from 08/12/2021 as
well as prior CT from 08/08/2021.

CLINICAL DATA: Initial evaluation for acute severe lower back pain,
sepsis, concern for infection.

EXAM:
MRI CERVICAL, THORACIC AND LUMBAR SPINE WITHOUT AND WITH CONTRAST
TECHNIQUE: Multiplanar and multiecho pulse sequences of the cervical spine, to
include the craniocervical junction and cervicothoracic junction,
and thoracic and lumbar spine, were obtained without and with
intravenous contrast.
CONTRAST:  10mL GADAVIST GADOBUTROL 1 MMOL/ML IV SOLN

[Series 31: T2 · sagittal · 4.0mm · 1.02mm/px · 5 of 17 slices shown (1 of 2)]
[im 1/17]
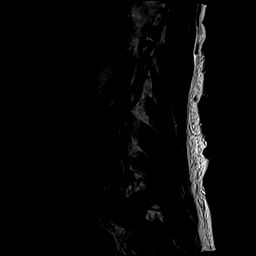
[im 5/17]
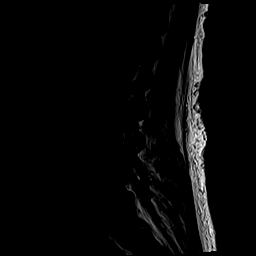
[im 9/17]
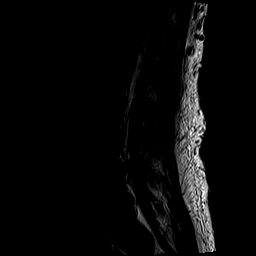
[im 13/17]
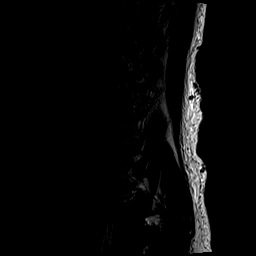
[im 17/17]
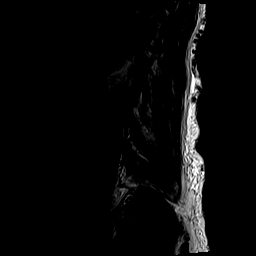

[Series 32: T1 · sagittal · 4.0mm · 1.02mm/px · 5 of 17 slices shown (1 of 2)]
[im 1/17]
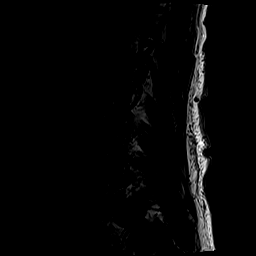
[im 5/17]
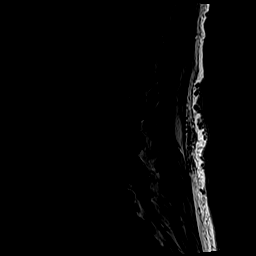
[im 9/17]
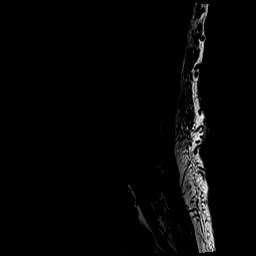
[im 13/17]
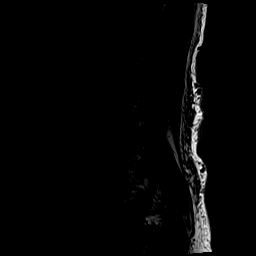
[im 17/17]
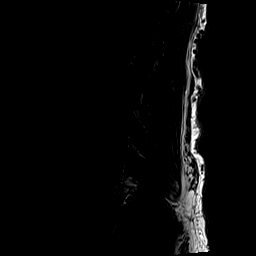

[Series 34: T2 · axial · 4.0mm · 0.78mm/px · z∈[-468,-239]mm · 8 of 38 slices shown (2 of 2)]
[im 1/38]
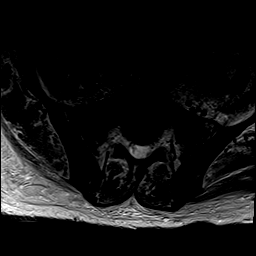
[im 5/38]
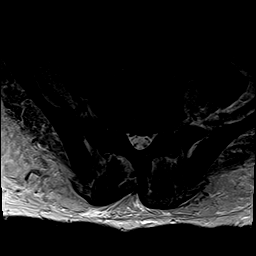
[im 13/38]
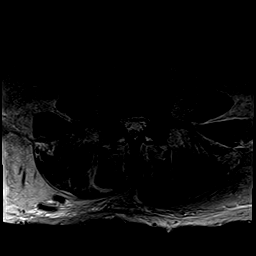
[im 17/38]
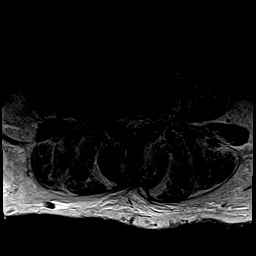
[im 21/38]
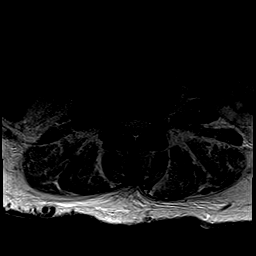
[im 25/38]
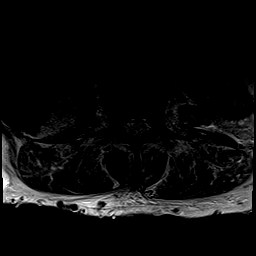
[im 33/38]
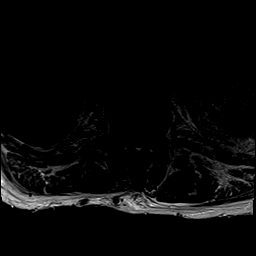
[im 38/38]
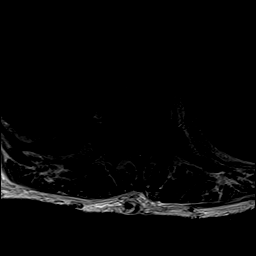

[Series 35: T1 · axial · 4.0mm · 0.39mm/px · z∈[-468,-239]mm · 8 of 38 slices shown (2 of 2)]
[im 1/38]
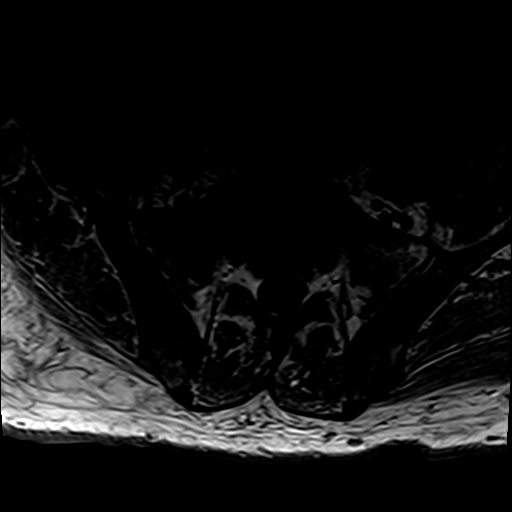
[im 5/38]
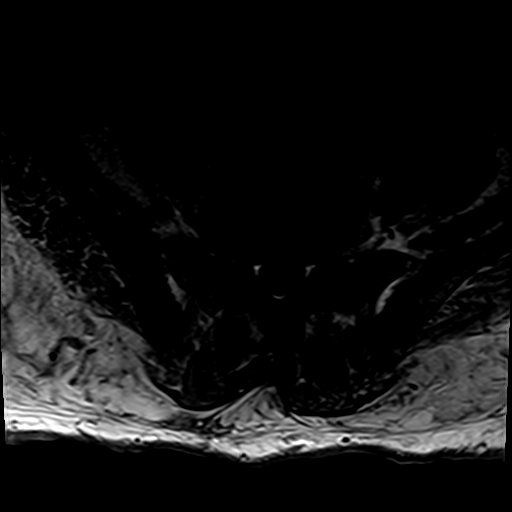
[im 13/38]
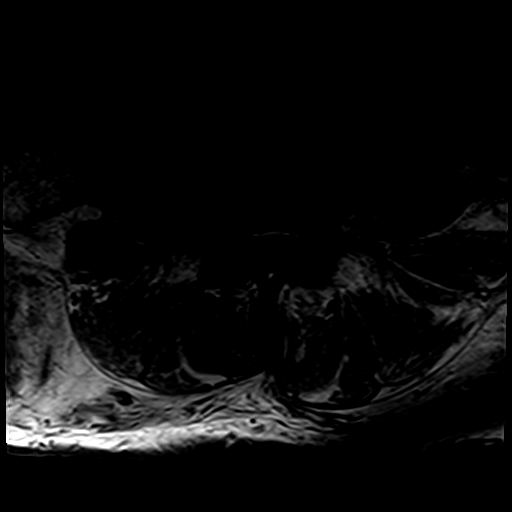
[im 17/38]
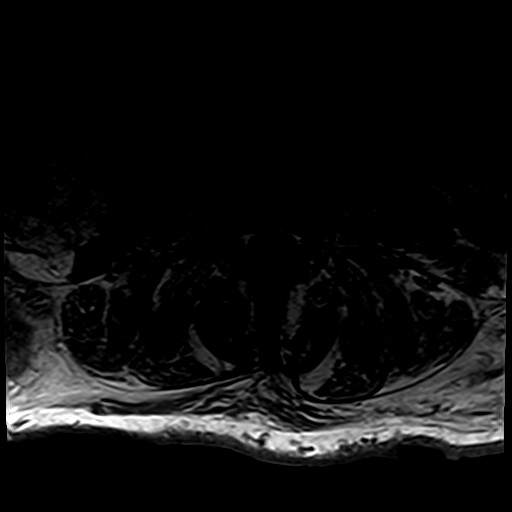
[im 21/38]
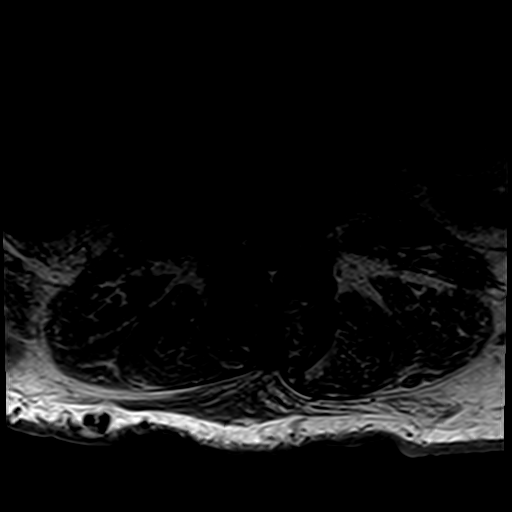
[im 25/38]
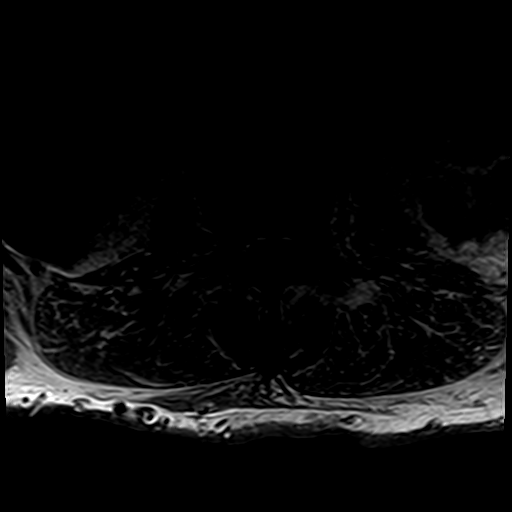
[im 33/38]
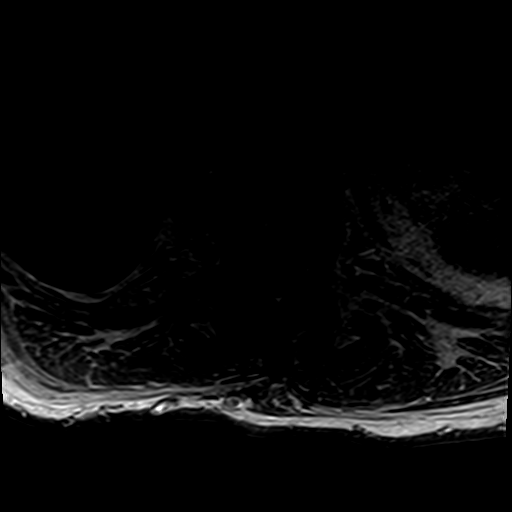
[im 38/38]
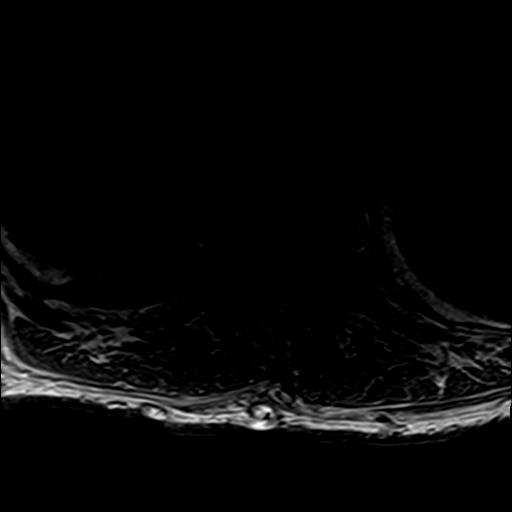

[Series 40: T1 fat-sat post-contrast · sagittal · 4.0mm · 1.02mm/px · 4 of 17 slices shown]
[im 1/17]
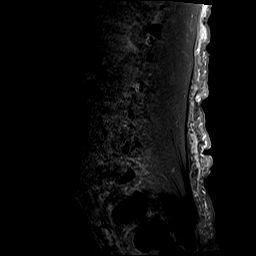
[im 6/17]
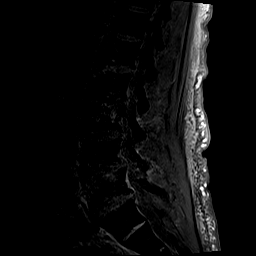
[im 11/17]
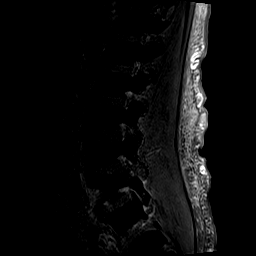
[im 17/17]
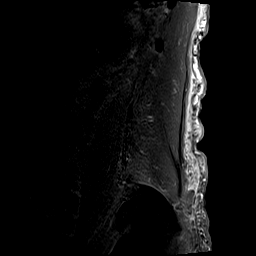

[30 of 48 positions shown; findings below may reference images not displayed]

FINDINGS: MRI CERVICAL SPINE FINDINGS

Alignment: Physiologic with preservation of the normal cervical
lordosis.

Vertebrae: Diffusely decreased T1 signal intensity seen throughout
the visualized bone marrow, nonspecific, but most commonly related
to anemia, smoking, or obesity. Fatty marrow conversion noted about
the C5-6 interspace. No discrete or worrisome osseous lesions.

There is increased STIR signal intensity with possible mild
enhancement about the C5-6 interspace as compared to previous,
suspicious for possible developing osteomyelitis no convincing
evidence for acute discitis within the intervening C5-6 interspace
at this time.

No other abnormal marrow edema or enhancement to suggest acute
infection elsewhere within the cervical spine.

Cord: Normal signal and morphology. No epidural abscess or other
collection. No abnormal enhancement.

Posterior Fossa, vertebral arteries, paraspinal tissues:
Prevertebral/retropharyngeal edema again seen extending from the
skull base to the level of C5-6, slightly increased in prominence as
compared to prior exam. No visible loculated soft tissue collection.
Normal flow voids seen within the vertebral arteries bilaterally.

Disc levels:

No other new or progressive finding as compared to recent MRI. No
high-grade spinal stenosis.

MRI THORACIC SPINE FINDINGS

Alignment: Physiologic with preservation of the normal thoracic
kyphosis. No listhesis.

Vertebrae: Diffusely decreased T1 signal intensity seen throughout
the visualized bone marrow. No discrete or worrisome osseous
lesions. No abnormal marrow edema or enhancement to suggest
osteomyelitis discitis or septic arthritis.

Cord: Normal signal and morphology. No epidural abscess or other
collection. No abnormal enhancement.

Paraspinal and other soft tissues: Prominent paravertebral T2
hyperintense soft tissue again seen, likely reflecting prominent
paravertebral fat. Extension into the subpleural spaces bilaterally.
Superimposed irregular left pleural effusion which appears partially
loculated. Multiple prominent venous collaterals noted throughout
the visualized subcutaneous fat.

Disc levels:

No significant disc pathology.  No stenosis or neural impingement.

MRI LUMBAR SPINE FINDINGS

Segmentation: Standard. Lowest well-formed disc space labeled the
L5-S1 level.

Alignment: Trace anterolisthesis of L4 on L5, chronic and facet
mediated. Alignment otherwise normal with preservation of the normal
lumbar lordosis.

Vertebrae: Diffusely decreased T1 signal intensity seen throughout
the visualized bone marrow. No discrete or worrisome osseous
lesions. Endplate Schmorl's node deformities with associated fatty
marrow conversion seen about the L2 vertebral body. There is fluid
signal intensity within the L4-5 disc space without discernible
enhancement, nonspecific, but could reflect early changes of acute
discitis. No overt evidence for associated osteomyelitis within the
adjacent vertebral bodies. Marrow edema and enhancement seen about
the L4-5 facets bilaterally, favored to be degenerative in nature.
No other abnormal marrow edema or enhancement.

Conus medullaris and cauda equina: Conus extends to the L1-2 level.
Conus and cauda equina appear normal. No epidural abscess or other
collection. No abnormal enhancement.

Paraspinal and other soft tissues: There is abnormal edema and
enhancement involving the left psoas muscle, extending from the
level of L3 inferiorly (series 41, image 35). Findings concerning
for acute infection. Superimposed 1.3 cm area of T2 signal intensity
suspicious for phlegmon/early abscess formation (series 34, image
38). No other loculated soft tissue collections. Multiple prominent
venous collaterals noted throughout the subcutaneous fat. Atrophic
horseshoe kidney with innumerable cysts partially visualized.

Disc levels:

L1-2: Endplate Schmorl's node deformity without significant disc
bulge. Mild facet hypertrophy. No stenosis.

L2-3: Mild disc bulge with endplate Schmorl's node deformity. No
spinal stenosis. Mild right L2 foraminal narrowing.

L3-4: Mild disc bulge. Mild bilateral facet hypertrophy. No
significant spinal stenosis. Mild right L3 foraminal narrowing.

L4-5: Disc bulge with reactive endplate change. Nonspecific fluid
signal intensity again noted within the L4-5 interspace. Moderate
bilateral facet hypertrophy with associated reactive marrow edema
and enhancement and small joint effusions. Resultant mild canal with
mild to moderate left greater than right lateral recess stenosis.
Mild right with moderate left L4 foraminal narrowing.

L5-S1: Mild disc bulge with reactive endplate spurring. No
significant spinal stenosis. Mild bilateral L5 foraminal narrowing.
IMPRESSION: 1. Interval development of marrow edema about the C5-6 interspace
since recent exam, concerning for possible developing osteomyelitis.
Adjacent prevertebral/retropharyngeal edema has slightly increased
from prior.
2. Abnormal inflammatory changes involving the left psoas muscle,
concerning for acute infection with myositis. Superimposed 1.3 cm
area of T2 signal intensity suspicious for phlegmon/early abscess
formation. Fluid signal intensity within the L4-5 interspace is
nonspecific, but could reflect changes of early discitis, and
possibly be the source of the left psoas infection.
3. Moderate facet degeneration at L4-5 with associated reactive
marrow edema and enhancement. Findings are favored to be
degenerative in nature, although attention at follow-up is
recommended.
4. Irregular and partially loculated left pleural effusion.
5. Underlying diffusely abnormal marrow edema, stable from prior.

## 2022-12-20 IMAGING — MR MR THORACIC SPINE WO/W CM
5 of 6 series · 28 of 48 positions shown · IV contrast (gadavist)
Comparison: Comparison made with previous MRI from 08/12/2021 as
well as prior CT from 08/08/2021.

CLINICAL DATA: Initial evaluation for acute severe lower back pain,
sepsis, concern for infection.

EXAM:
MRI CERVICAL, THORACIC AND LUMBAR SPINE WITHOUT AND WITH CONTRAST
TECHNIQUE: Multiplanar and multiecho pulse sequences of the cervical spine, to
include the craniocervical junction and cervicothoracic junction,
and thoracic and lumbar spine, were obtained without and with
intravenous contrast.
CONTRAST:  10mL GADAVIST GADOBUTROL 1 MMOL/ML IV SOLN

[Series 18: T1 · sagittal · 6.0mm · 1.88mm/px · 2 of 9 slices shown]
[im 1/9]
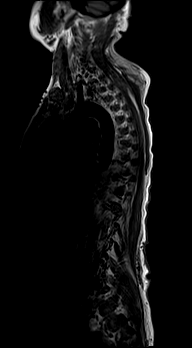
[im 9/9]
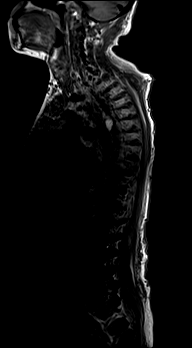

[Series 19: T2 · sagittal · 3.0mm · 1.33mm/px · 5 of 19 slices shown (1 of 2)]
[im 1/19]
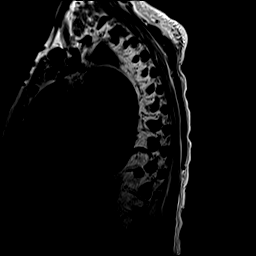
[im 5/19]
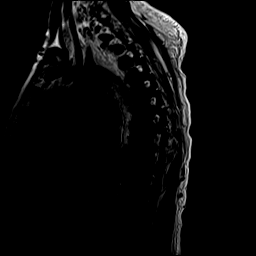
[im 10/19]
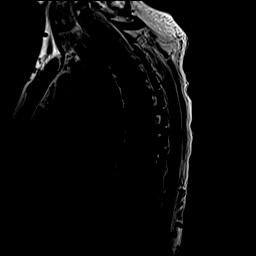
[im 14/19]
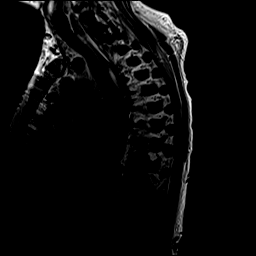
[im 19/19]
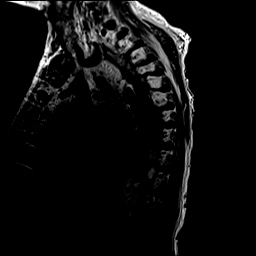

[Series 22: T2 · axial · 4.0mm · 0.59mm/px · z∈[-240,-48]mm · 9 of 41 slices shown (2 of 2)]
[im 1/41]
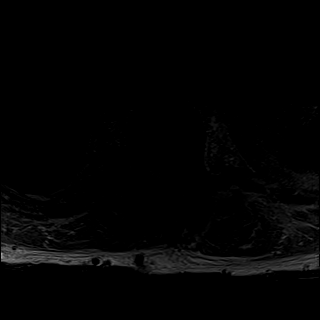
[im 8/41]
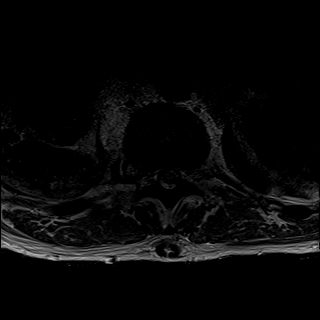
[im 11/41]
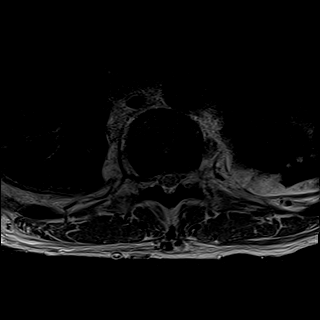
[im 19/41]
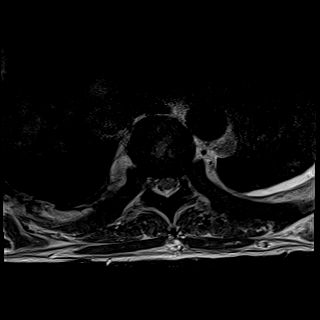
[im 22/41]
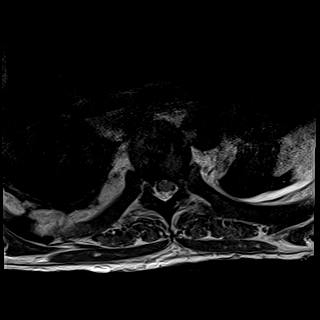
[im 30/41]
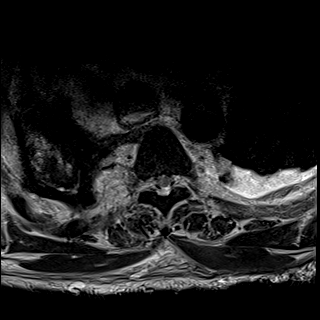
[im 33/41]
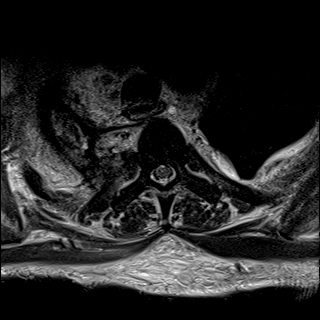
[im 37/41]
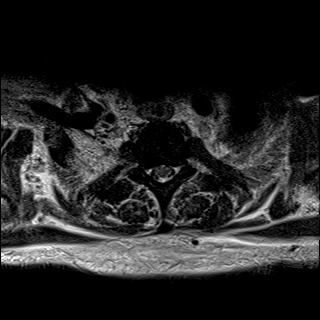
[im 41/41]
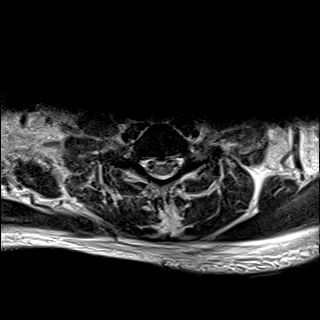

[Series 36: T1 fat-sat post-contrast · sagittal · 3.0mm · 1.33mm/px · 5 of 19 slices shown]
[im 1/19]
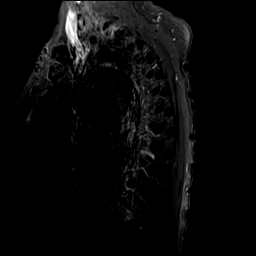
[im 5/19]
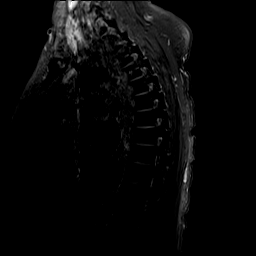
[im 10/19]
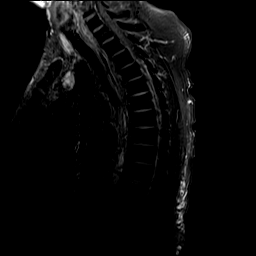
[im 14/19]
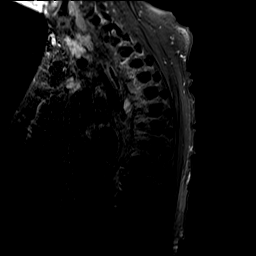
[im 19/19]
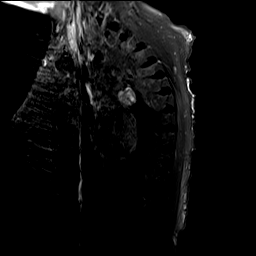

[Series 37: T1 post-contrast · axial · 4.0mm · 0.37mm/px · z∈[-240,-83]mm · 7 of 41 slices shown]
[im 1/41]
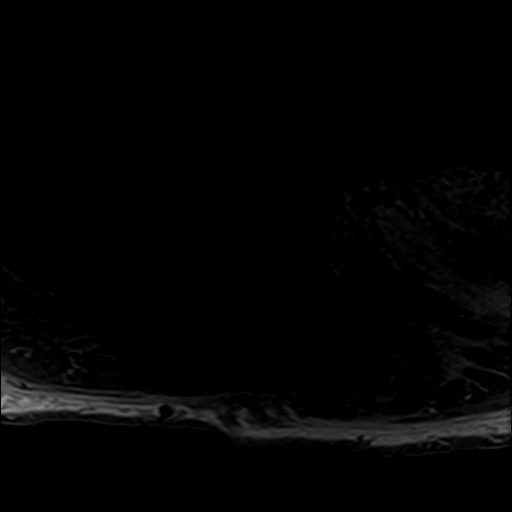
[im 8/41]
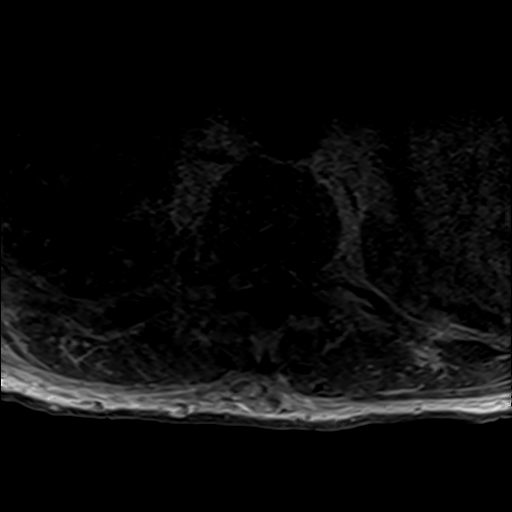
[im 11/41]
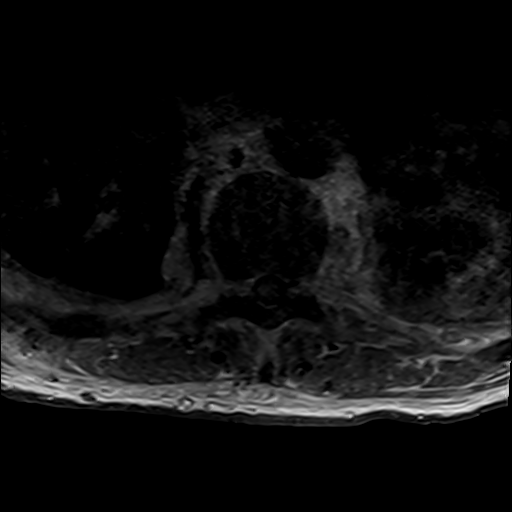
[im 19/41]
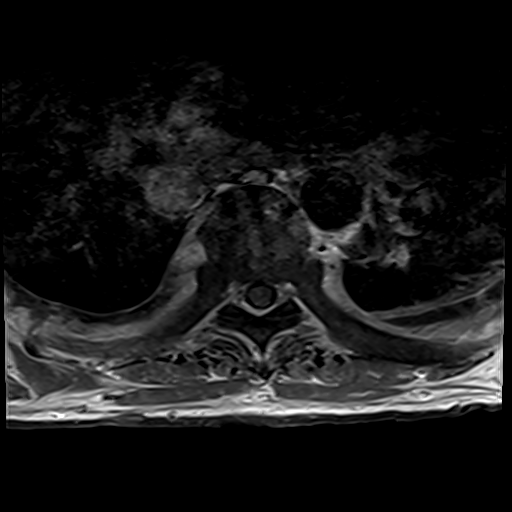
[im 22/41]
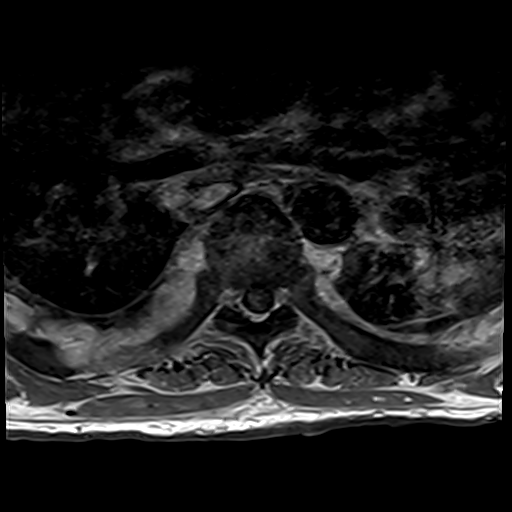
[im 30/41]
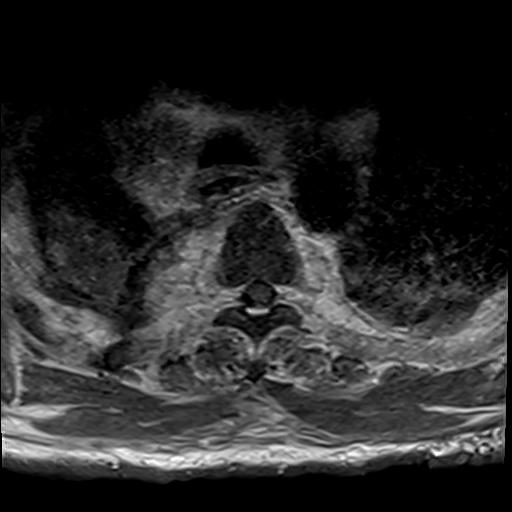
[im 33/41]
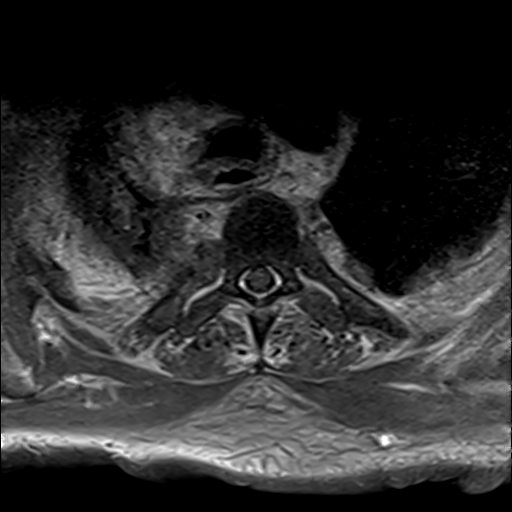

[28 of 48 positions shown; findings below may reference images not displayed]

FINDINGS: MRI CERVICAL SPINE FINDINGS

Alignment: Physiologic with preservation of the normal cervical
lordosis.

Vertebrae: Diffusely decreased T1 signal intensity seen throughout
the visualized bone marrow, nonspecific, but most commonly related
to anemia, smoking, or obesity. Fatty marrow conversion noted about
the C5-6 interspace. No discrete or worrisome osseous lesions.

There is increased STIR signal intensity with possible mild
enhancement about the C5-6 interspace as compared to previous,
suspicious for possible developing osteomyelitis no convincing
evidence for acute discitis within the intervening C5-6 interspace
at this time.

No other abnormal marrow edema or enhancement to suggest acute
infection elsewhere within the cervical spine.

Cord: Normal signal and morphology. No epidural abscess or other
collection. No abnormal enhancement.

Posterior Fossa, vertebral arteries, paraspinal tissues:
Prevertebral/retropharyngeal edema again seen extending from the
skull base to the level of C5-6, slightly increased in prominence as
compared to prior exam. No visible loculated soft tissue collection.
Normal flow voids seen within the vertebral arteries bilaterally.

Disc levels:

No other new or progressive finding as compared to recent MRI. No
high-grade spinal stenosis.

MRI THORACIC SPINE FINDINGS

Alignment: Physiologic with preservation of the normal thoracic
kyphosis. No listhesis.

Vertebrae: Diffusely decreased T1 signal intensity seen throughout
the visualized bone marrow. No discrete or worrisome osseous
lesions. No abnormal marrow edema or enhancement to suggest
osteomyelitis discitis or septic arthritis.

Cord: Normal signal and morphology. No epidural abscess or other
collection. No abnormal enhancement.

Paraspinal and other soft tissues: Prominent paravertebral T2
hyperintense soft tissue again seen, likely reflecting prominent
paravertebral fat. Extension into the subpleural spaces bilaterally.
Superimposed irregular left pleural effusion which appears partially
loculated. Multiple prominent venous collaterals noted throughout
the visualized subcutaneous fat.

Disc levels:

No significant disc pathology.  No stenosis or neural impingement.

MRI LUMBAR SPINE FINDINGS

Segmentation: Standard. Lowest well-formed disc space labeled the
L5-S1 level.

Alignment: Trace anterolisthesis of L4 on L5, chronic and facet
mediated. Alignment otherwise normal with preservation of the normal
lumbar lordosis.

Vertebrae: Diffusely decreased T1 signal intensity seen throughout
the visualized bone marrow. No discrete or worrisome osseous
lesions. Endplate Schmorl's node deformities with associated fatty
marrow conversion seen about the L2 vertebral body. There is fluid
signal intensity within the L4-5 disc space without discernible
enhancement, nonspecific, but could reflect early changes of acute
discitis. No overt evidence for associated osteomyelitis within the
adjacent vertebral bodies. Marrow edema and enhancement seen about
the L4-5 facets bilaterally, favored to be degenerative in nature.
No other abnormal marrow edema or enhancement.

Conus medullaris and cauda equina: Conus extends to the L1-2 level.
Conus and cauda equina appear normal. No epidural abscess or other
collection. No abnormal enhancement.

Paraspinal and other soft tissues: There is abnormal edema and
enhancement involving the left psoas muscle, extending from the
level of L3 inferiorly (series 41, image 35). Findings concerning
for acute infection. Superimposed 1.3 cm area of T2 signal intensity
suspicious for phlegmon/early abscess formation (series 34, image
38). No other loculated soft tissue collections. Multiple prominent
venous collaterals noted throughout the subcutaneous fat. Atrophic
horseshoe kidney with innumerable cysts partially visualized.

Disc levels:

L1-2: Endplate Schmorl's node deformity without significant disc
bulge. Mild facet hypertrophy. No stenosis.

L2-3: Mild disc bulge with endplate Schmorl's node deformity. No
spinal stenosis. Mild right L2 foraminal narrowing.

L3-4: Mild disc bulge. Mild bilateral facet hypertrophy. No
significant spinal stenosis. Mild right L3 foraminal narrowing.

L4-5: Disc bulge with reactive endplate change. Nonspecific fluid
signal intensity again noted within the L4-5 interspace. Moderate
bilateral facet hypertrophy with associated reactive marrow edema
and enhancement and small joint effusions. Resultant mild canal with
mild to moderate left greater than right lateral recess stenosis.
Mild right with moderate left L4 foraminal narrowing.

L5-S1: Mild disc bulge with reactive endplate spurring. No
significant spinal stenosis. Mild bilateral L5 foraminal narrowing.
IMPRESSION: 1. Interval development of marrow edema about the C5-6 interspace
since recent exam, concerning for possible developing osteomyelitis.
Adjacent prevertebral/retropharyngeal edema has slightly increased
from prior.
2. Abnormal inflammatory changes involving the left psoas muscle,
concerning for acute infection with myositis. Superimposed 1.3 cm
area of T2 signal intensity suspicious for phlegmon/early abscess
formation. Fluid signal intensity within the L4-5 interspace is
nonspecific, but could reflect changes of early discitis, and
possibly be the source of the left psoas infection.
3. Moderate facet degeneration at L4-5 with associated reactive
marrow edema and enhancement. Findings are favored to be
degenerative in nature, although attention at follow-up is
recommended.
4. Irregular and partially loculated left pleural effusion.
5. Underlying diffusely abnormal marrow edema, stable from prior.

## 2022-12-20 IMAGING — MR MR CERVICAL SPINE WO/W CM
16 of 24 series · 30 of 48 positions shown · IV contrast (gadavist)
Comparison: Comparison made with previous MRI from 08/12/2021 as
well as prior CT from 08/08/2021.

CLINICAL DATA: Initial evaluation for acute severe lower back pain,
sepsis, concern for infection.

EXAM:
MRI CERVICAL, THORACIC AND LUMBAR SPINE WITHOUT AND WITH CONTRAST
TECHNIQUE: Multiplanar and multiecho pulse sequences of the cervical spine, to
include the craniocervical junction and cervicothoracic junction,
and thoracic and lumbar spine, were obtained without and with
intravenous contrast.
CONTRAST:  10mL GADAVIST GADOBUTROL 1 MMOL/ML IV SOLN

[Series 18: T1 · sagittal · 6.0mm · 1.88mm/px · 2 of 9 slices shown (1 of 5)]
[im 1/9]
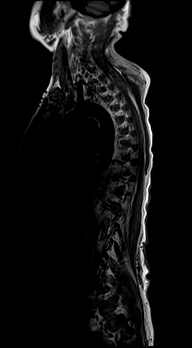
[im 9/9]
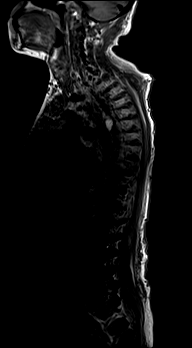

[Series 19: T2 · sagittal · 3.0mm · 1.33mm/px · 3 of 19 slices shown (1 of 6)]
[im 1/19]
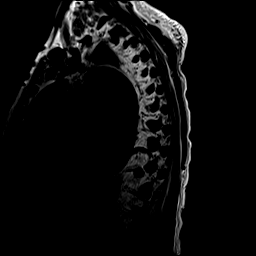
[im 10/19]
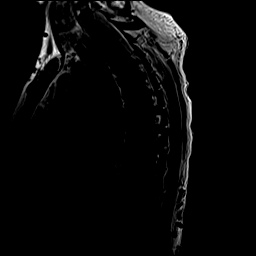
[im 19/19]
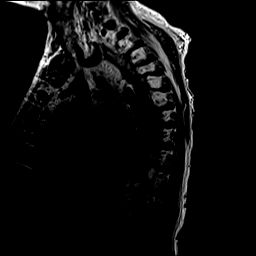

[Series 20: T1 · sagittal · 3.0mm · 1.33mm/px · 2 of 19 slices shown (2 of 5)]
[im 1/19]
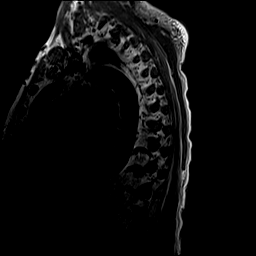
[im 19/19]
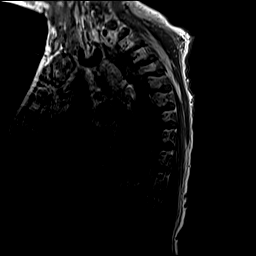

[Series 21: STIR · sagittal · 3.0mm · 0.66mm/px · 2 of 19 slices shown (1 of 3)]
[im 1/19]
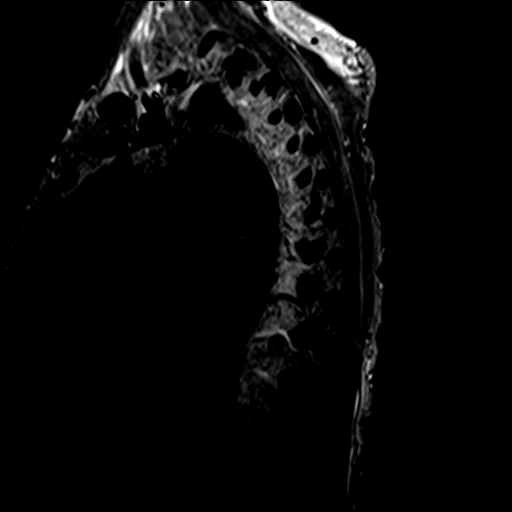
[im 19/19]
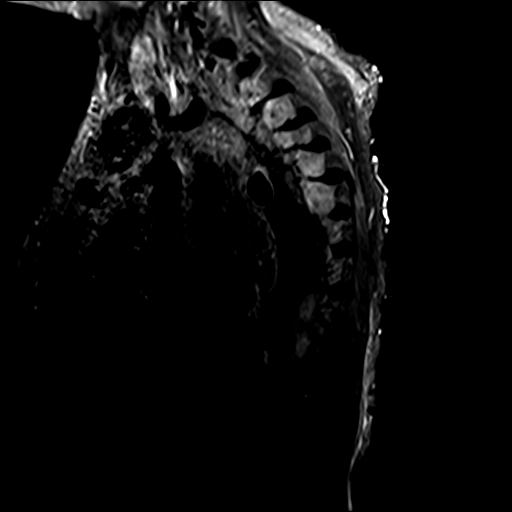

[Series 22: T2 · axial · 4.0mm · 0.59mm/px · z∈[-240,-48]mm · 3 of 41 slices shown (2 of 6)]
[im 1/41]
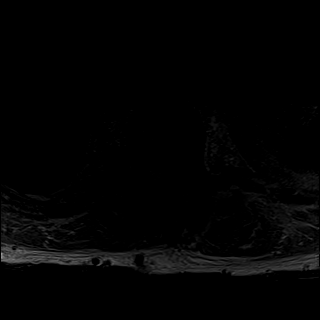
[im 21/41]
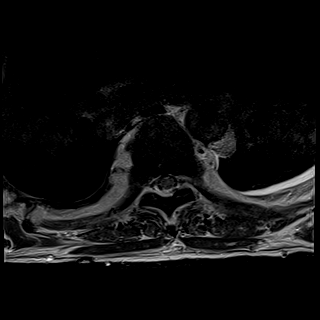
[im 41/41]
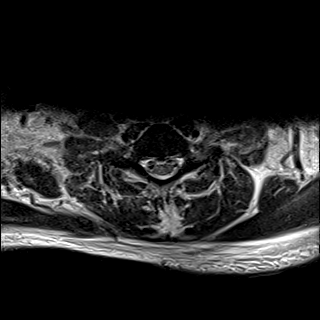

[Series 25: T2 · sagittal · 3.0mm · 0.56mm/px · 1 of 15 slices shown (3 of 6)]
[im 1/15]
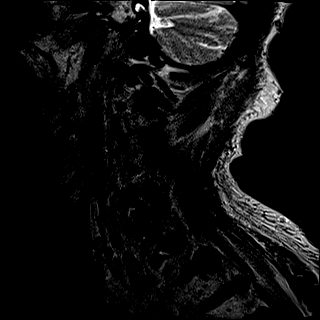

[Series 27: STIR · sagittal · 3.0mm · 0.56mm/px · 1 of 15 slices shown (2 of 3)]
[im 1/15]
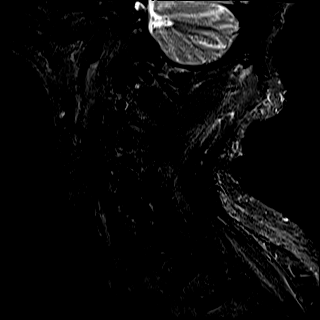

[Series 28: T2 · axial · 3.0mm · 0.70mm/px · z∈[-37,+55]mm · 2 of 29 slices shown (4 of 6)]
[im 1/29]
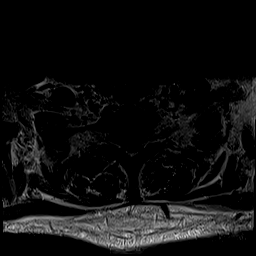
[im 29/29]
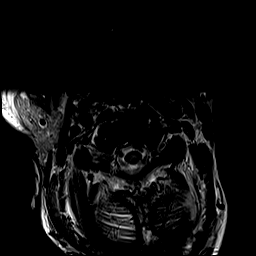

[Series 30: T1 · axial · non-contrast · 3.0mm · 0.35mm/px · z∈[-37,+55]mm · 2 of 29 slices shown (3 of 5)]
[im 1/29]
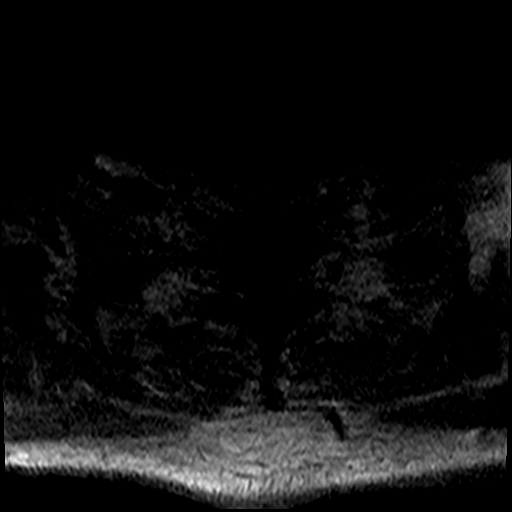
[im 29/29]
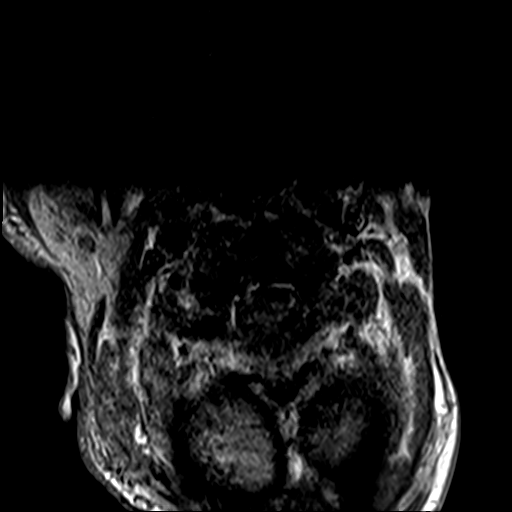

[Series 31: T2 · sagittal · 4.0mm · 1.02mm/px · 1 of 17 slices shown (5 of 6)]
[im 1/17]
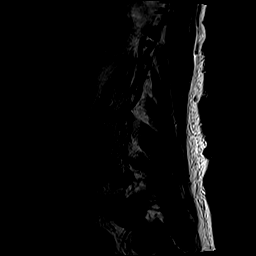

[Series 32: T1 · sagittal · 4.0mm · 1.02mm/px · 1 of 17 slices shown (4 of 5)]
[im 1/17]
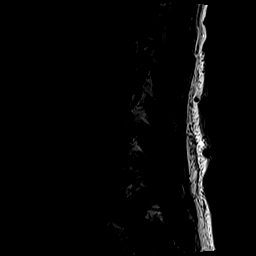

[Series 33: STIR · sagittal · 4.0mm · 0.51mm/px · 1 of 17 slices shown (3 of 3)]
[im 1/17]
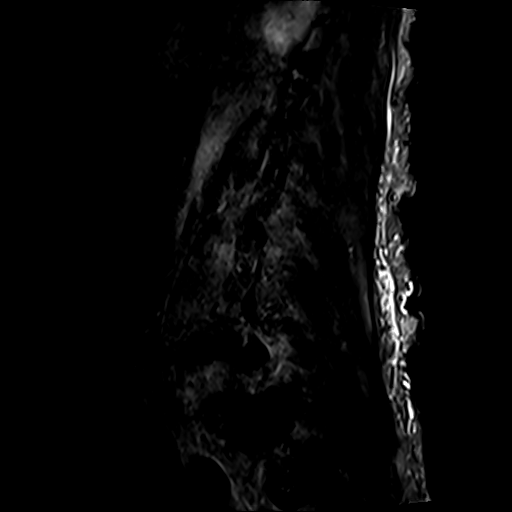

[Series 34: T2 · axial · 4.0mm · 0.78mm/px · z∈[-468,-239]mm · 3 of 38 slices shown (6 of 6)]
[im 1/38]
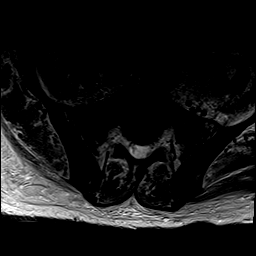
[im 19/38]
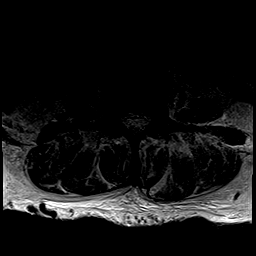
[im 38/38]
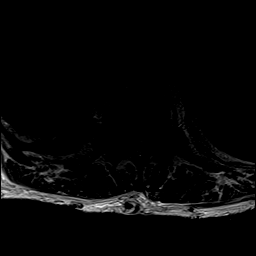

[Series 35: T1 · axial · 4.0mm · 0.39mm/px · z∈[-468,-239]mm · 3 of 38 slices shown (5 of 5)]
[im 1/38]
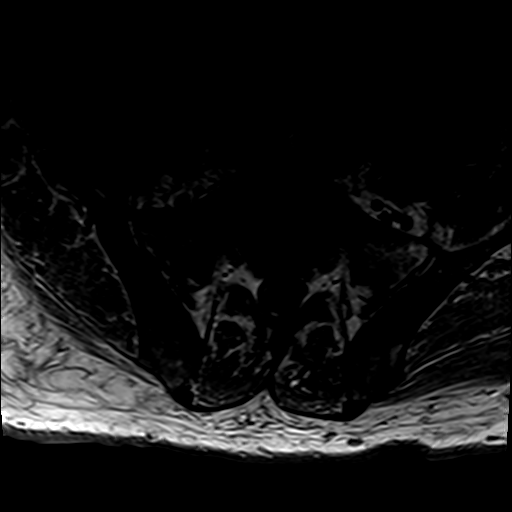
[im 19/38]
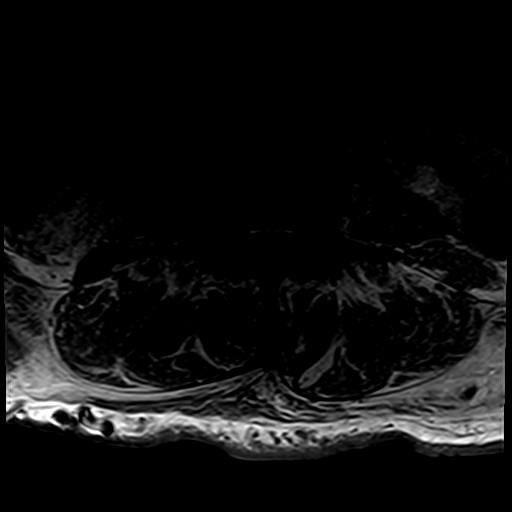
[im 38/38]
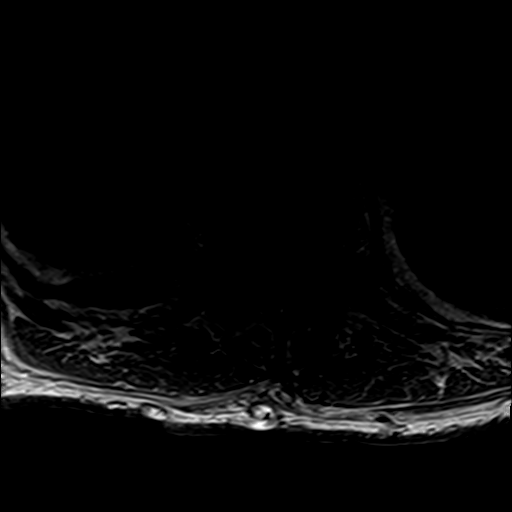

[Series 36: T1 fat-sat post-contrast · sagittal · 3.0mm · 1.33mm/px · 2 of 19 slices shown (1 of 2)]
[im 1/19]
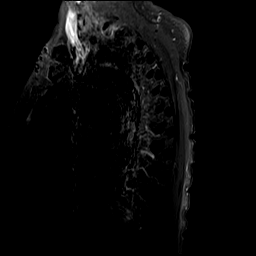
[im 19/19]
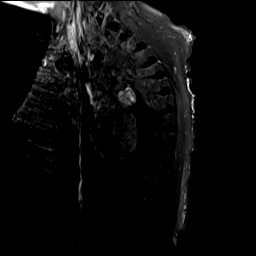

[Series 40: T1 fat-sat post-contrast · sagittal · 4.0mm · 1.02mm/px · 1 of 17 slices shown (2 of 2)]
[im 1/17]
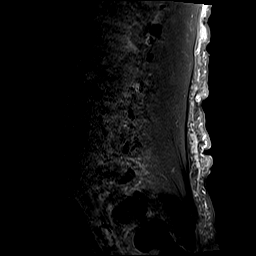

[30 of 48 positions shown; findings below may reference images not displayed]

FINDINGS: MRI CERVICAL SPINE FINDINGS

Alignment: Physiologic with preservation of the normal cervical
lordosis.

Vertebrae: Diffusely decreased T1 signal intensity seen throughout
the visualized bone marrow, nonspecific, but most commonly related
to anemia, smoking, or obesity. Fatty marrow conversion noted about
the C5-6 interspace. No discrete or worrisome osseous lesions.

There is increased STIR signal intensity with possible mild
enhancement about the C5-6 interspace as compared to previous,
suspicious for possible developing osteomyelitis no convincing
evidence for acute discitis within the intervening C5-6 interspace
at this time.

No other abnormal marrow edema or enhancement to suggest acute
infection elsewhere within the cervical spine.

Cord: Normal signal and morphology. No epidural abscess or other
collection. No abnormal enhancement.

Posterior Fossa, vertebral arteries, paraspinal tissues:
Prevertebral/retropharyngeal edema again seen extending from the
skull base to the level of C5-6, slightly increased in prominence as
compared to prior exam. No visible loculated soft tissue collection.
Normal flow voids seen within the vertebral arteries bilaterally.

Disc levels:

No other new or progressive finding as compared to recent MRI. No
high-grade spinal stenosis.

MRI THORACIC SPINE FINDINGS

Alignment: Physiologic with preservation of the normal thoracic
kyphosis. No listhesis.

Vertebrae: Diffusely decreased T1 signal intensity seen throughout
the visualized bone marrow. No discrete or worrisome osseous
lesions. No abnormal marrow edema or enhancement to suggest
osteomyelitis discitis or septic arthritis.

Cord: Normal signal and morphology. No epidural abscess or other
collection. No abnormal enhancement.

Paraspinal and other soft tissues: Prominent paravertebral T2
hyperintense soft tissue again seen, likely reflecting prominent
paravertebral fat. Extension into the subpleural spaces bilaterally.
Superimposed irregular left pleural effusion which appears partially
loculated. Multiple prominent venous collaterals noted throughout
the visualized subcutaneous fat.

Disc levels:

No significant disc pathology.  No stenosis or neural impingement.

MRI LUMBAR SPINE FINDINGS

Segmentation: Standard. Lowest well-formed disc space labeled the
L5-S1 level.

Alignment: Trace anterolisthesis of L4 on L5, chronic and facet
mediated. Alignment otherwise normal with preservation of the normal
lumbar lordosis.

Vertebrae: Diffusely decreased T1 signal intensity seen throughout
the visualized bone marrow. No discrete or worrisome osseous
lesions. Endplate Schmorl's node deformities with associated fatty
marrow conversion seen about the L2 vertebral body. There is fluid
signal intensity within the L4-5 disc space without discernible
enhancement, nonspecific, but could reflect early changes of acute
discitis. No overt evidence for associated osteomyelitis within the
adjacent vertebral bodies. Marrow edema and enhancement seen about
the L4-5 facets bilaterally, favored to be degenerative in nature.
No other abnormal marrow edema or enhancement.

Conus medullaris and cauda equina: Conus extends to the L1-2 level.
Conus and cauda equina appear normal. No epidural abscess or other
collection. No abnormal enhancement.

Paraspinal and other soft tissues: There is abnormal edema and
enhancement involving the left psoas muscle, extending from the
level of L3 inferiorly (series 41, image 35). Findings concerning
for acute infection. Superimposed 1.3 cm area of T2 signal intensity
suspicious for phlegmon/early abscess formation (series 34, image
38). No other loculated soft tissue collections. Multiple prominent
venous collaterals noted throughout the subcutaneous fat. Atrophic
horseshoe kidney with innumerable cysts partially visualized.

Disc levels:

L1-2: Endplate Schmorl's node deformity without significant disc
bulge. Mild facet hypertrophy. No stenosis.

L2-3: Mild disc bulge with endplate Schmorl's node deformity. No
spinal stenosis. Mild right L2 foraminal narrowing.

L3-4: Mild disc bulge. Mild bilateral facet hypertrophy. No
significant spinal stenosis. Mild right L3 foraminal narrowing.

L4-5: Disc bulge with reactive endplate change. Nonspecific fluid
signal intensity again noted within the L4-5 interspace. Moderate
bilateral facet hypertrophy with associated reactive marrow edema
and enhancement and small joint effusions. Resultant mild canal with
mild to moderate left greater than right lateral recess stenosis.
Mild right with moderate left L4 foraminal narrowing.

L5-S1: Mild disc bulge with reactive endplate spurring. No
significant spinal stenosis. Mild bilateral L5 foraminal narrowing.
IMPRESSION: 1. Interval development of marrow edema about the C5-6 interspace
since recent exam, concerning for possible developing osteomyelitis.
Adjacent prevertebral/retropharyngeal edema has slightly increased
from prior.
2. Abnormal inflammatory changes involving the left psoas muscle,
concerning for acute infection with myositis. Superimposed 1.3 cm
area of T2 signal intensity suspicious for phlegmon/early abscess
formation. Fluid signal intensity within the L4-5 interspace is
nonspecific, but could reflect changes of early discitis, and
possibly be the source of the left psoas infection.
3. Moderate facet degeneration at L4-5 with associated reactive
marrow edema and enhancement. Findings are favored to be
degenerative in nature, although attention at follow-up is
recommended.
4. Irregular and partially loculated left pleural effusion.
5. Underlying diffusely abnormal marrow edema, stable from prior.

## 2022-12-25 IMAGING — XA IR FLUORO GUIDE CV LINE*L*
1 series · 13 of 21 positions shown · IV contrast (IODINE)
Comparison: none

INDICATION: Long-term antibiotics.  Poor IV access

[Series 1: interv standard · 6 acquisitions, 13 frames shown]
[im 1/6]
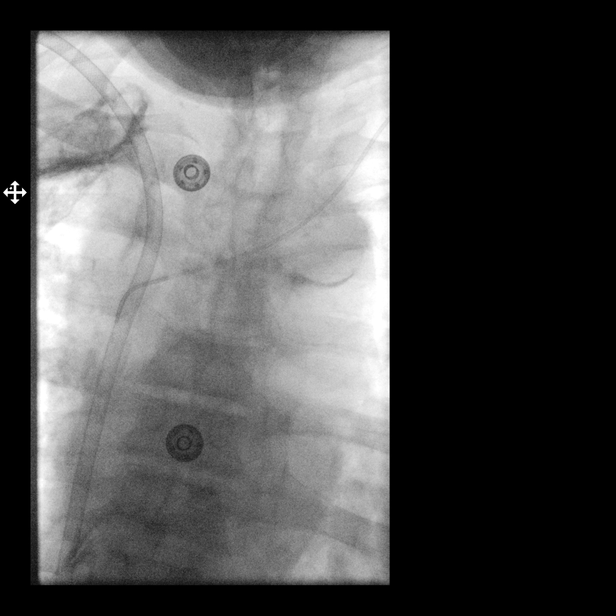
[im 1/6]
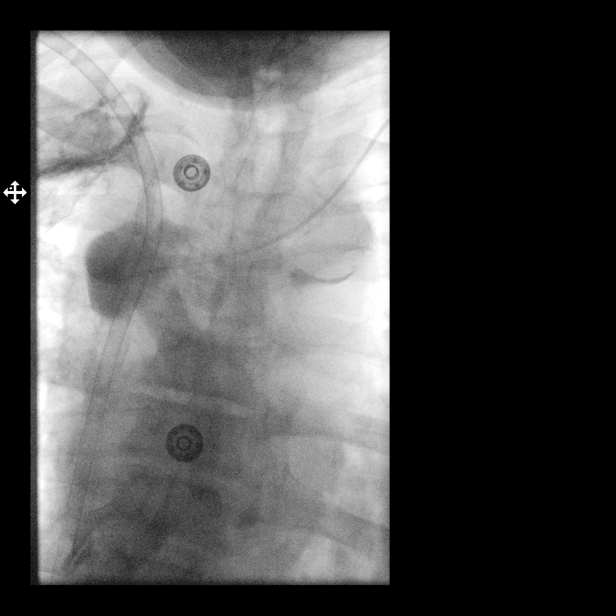
[im 2/6]
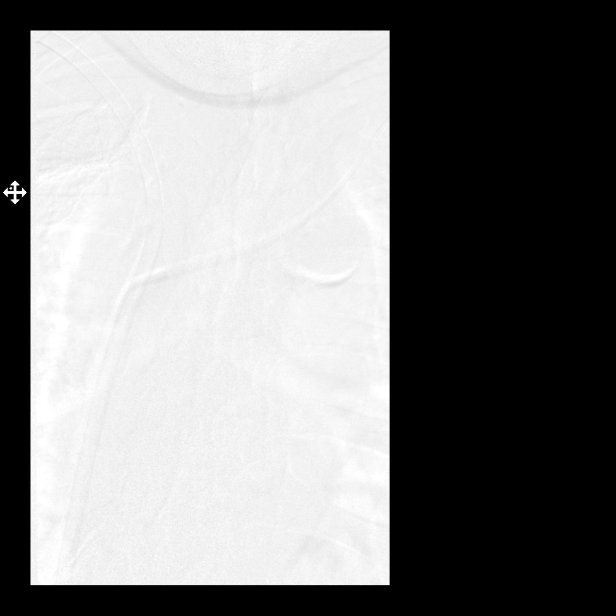
[im 2/6]
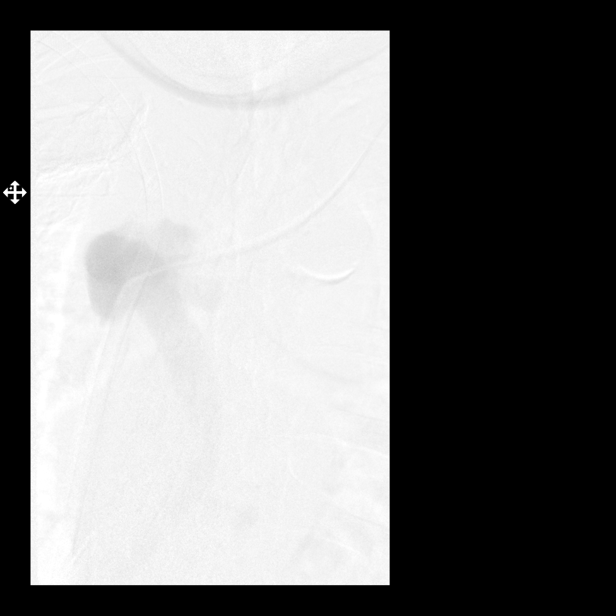
[im 2/6]
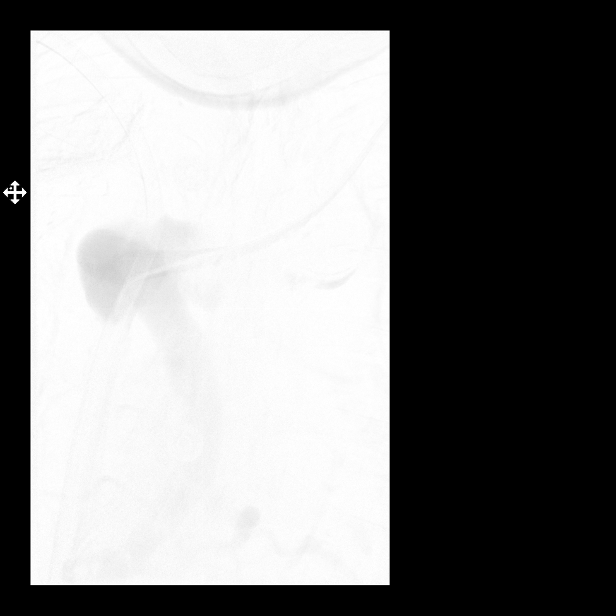
[im 3/6]
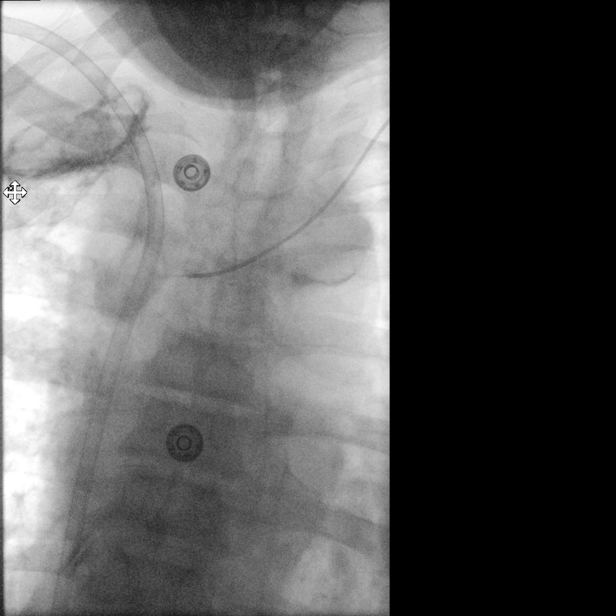
[im 3/6]
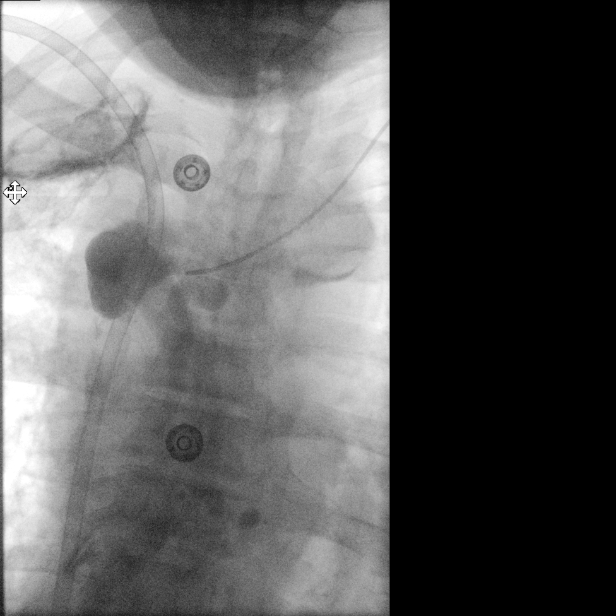
[im 4/6]
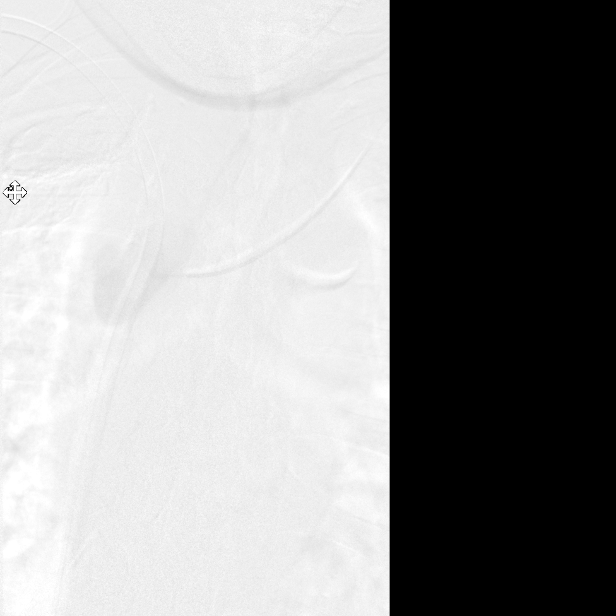
[im 4/6]
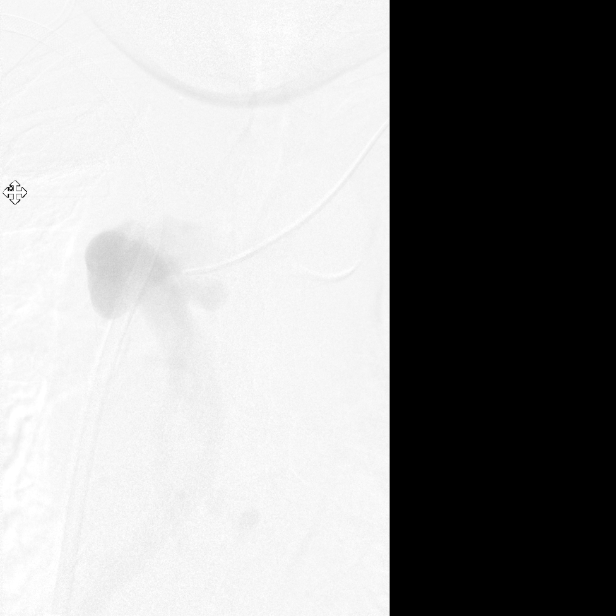
[im 4/6]
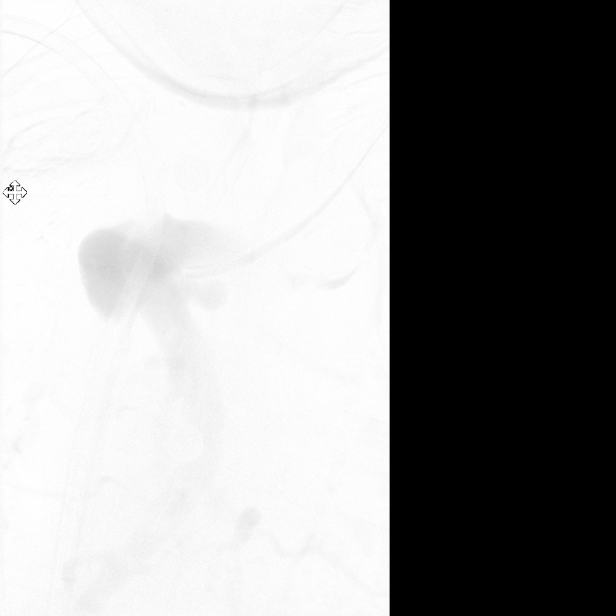
[im 5/6]
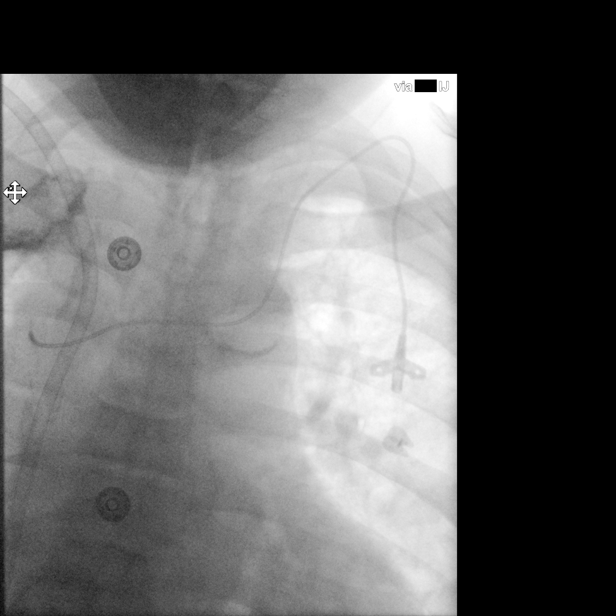
[im 6/6]
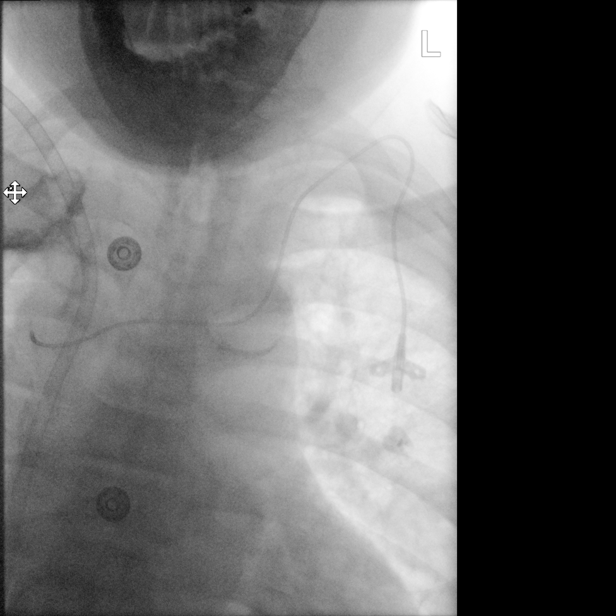
[im 6/6]
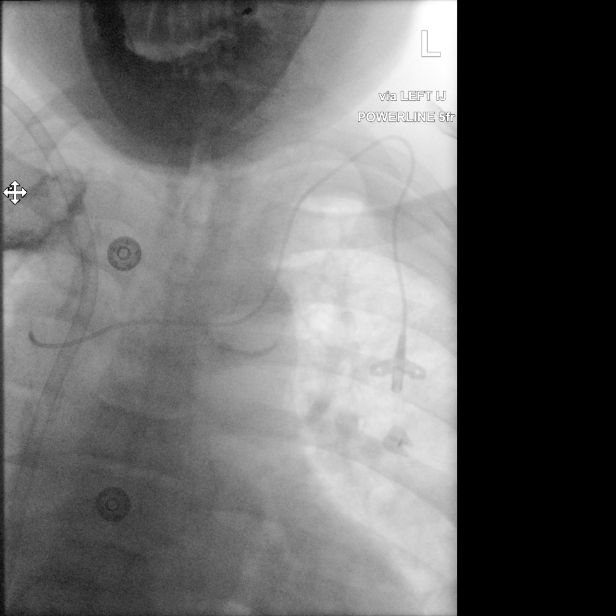

[13 of 21 positions shown; findings below may reference images not displayed]

EXAM:
ULTRASOUND AND FLUOROSCOPIC GUIDED PLACEMENT OF TUNNELED CENTRAL
VENOUS CATHETER

MEDICATIONS:
20 mL of Usovue-L33 intravenous contrast.

ANESTHESIA/SEDATION:
Versed 0 mg IV; Fentanyl 25 mcg IV;

Sedation Time: 45 minutes. The patient was continuously monitored
during the procedure by the interventional radiology nurse under my
direct supervision.

FLUOROSCOPY TIME:  8 minutes and 0 seconds (103.9 mGy)

COMPLICATIONS:
None immediate.

PROCEDURE:
Informed written consent was obtained from the patient after a
discussion of the risks, benefits, and alternatives to treatment.
Questions regarding the procedure were encouraged and answered. A
preprocedure ultrasound demonstrated complete occlusion of the RIGHT
internal jugular vein (see key image).

The LEFT neck and chest were prepped with chlorhexidine in a sterile
fashion, and a sterile drape was applied covering the operative
field. Maximum barrier sterile technique with sterile gowns and
gloves were used for the procedure. A timeout was performed prior to
the initiation of the procedure.

After the overlying soft tissues were anesthetized, a small venotomy
incision was created and a micropuncture kit was utilized to access
the LEFT internal jugular vein. Real-time ultrasound guidance was
utilized for vascular access including the acquisition of a
permanent ultrasound image documenting patency of the accessed
vessel.

The patient has an indwelling RIGHT upper extremity HERO graft.
Multiple attempts at accessing the RIGHT atrium along the graft
catheter using a Kumpe catheter and 0.035 inch glidewire were
unsuccessful. Therefore a digital subtraction angiogram (DSA) of the
central veins was obtained demonstrating central venous occlusion
and dilated azygous vein.

The decision was made to leave the catheter tip within the azygous.
A microwire was utilized to measure appropriate catheter length. The
micropuncture sheath was exchanged for a peel-away sheath over a
guidewire. A 5 French single lumen tunneled central venous catheter
measuring 25 cm was tunneled in a retrograde fashion from the
anterior chest wall to the venotomy incision.

The catheter was then placed through the peel-away sheath with tip
ultimately positioned at the SVC / azygous junction. Final catheter
positioning was confirmed and documented with a spot radiographic
image. The catheter aspirates and flushes normally. The catheter was
flushed with appropriate volume heparin dwells.

The catheter exit site was secured with a 2-0 nylon retention
suture. The venotomy incision was closed with Dermabond. Dressings
were applied. The patient tolerated the procedure well without
immediate post procedural complication.
FINDINGS: 1. Central venous occlusion involving the distal SVC, proximal to
the superior cavoatrial junction, with compensatory dilated azygous
vein.
2. Azygous vein appears to be the dominant upper extremity cephalic
venous outflow (see key image).
3. New, 5 French tunneled central venous catheter tip intentionally
placed at the SVC/azygous junction.
IMPRESSION: 1. Successful placement of 5 French, 25 cm single lumen tunneled
"Powerline" central venous catheter via the LEFT internal jugular
vein, as above.
2. Central venous occlusion at the distal SVC with compensatory
dilated azygous vein.
3. New catheter tip intentionally positioned at the SVC/azygous
junction, and may migrate into the azygous vein on mobilization.

PLAN:
The patient may benefit from central venous recanalization. Please
place referral to [REDACTED] for evaluation.

## 2023-01-01 IMAGING — CT CT HEAD W/O CM
4 series · 16 of 47 positions shown, 18 images · non-contrast
Comparison: None.

CLINICAL DATA: Mental status change, unknown cause

EXAM:
CT HEAD WITHOUT CONTRAST
TECHNIQUE: Contiguous axial images were obtained from the base of the skull
through the vertex without intravenous contrast.

[Series 2: head bone · axial · 0.46mm/px · z∈[-137,-83]mm · 4 of 82 slices shown]
[im 8/82  bone]
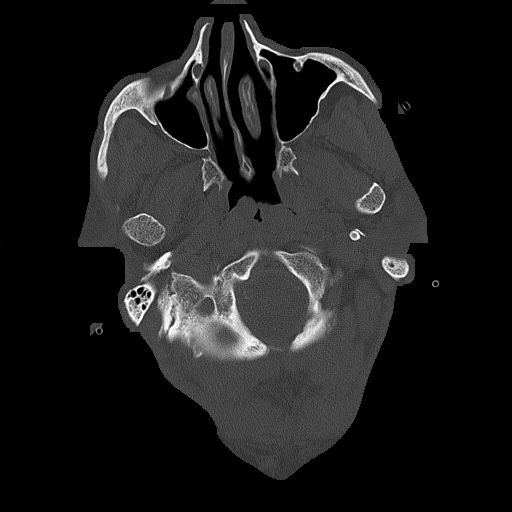
[im 16/82  bone]
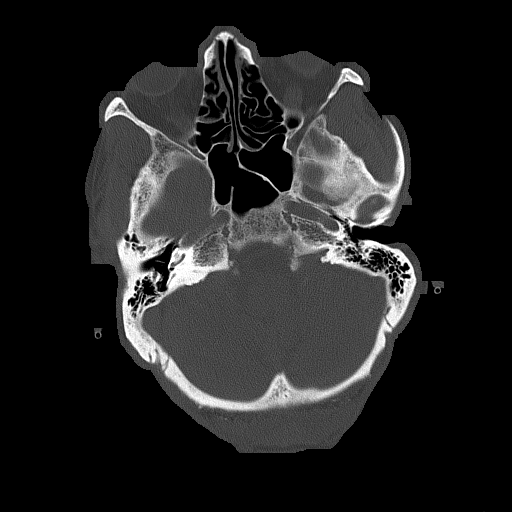
[im 28/82  bone]
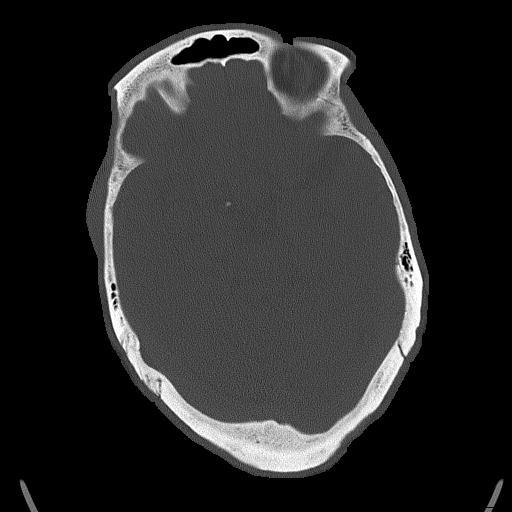
[im 35/82  bone]
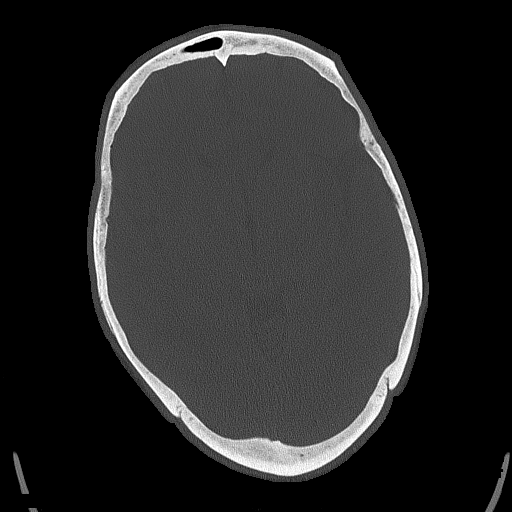

[Series 3: head wo · axial · 0.46mm/px · z∈[-131,-21]mm · 6 of 32 slices shown, 8 images]
[im 5/32  brain]
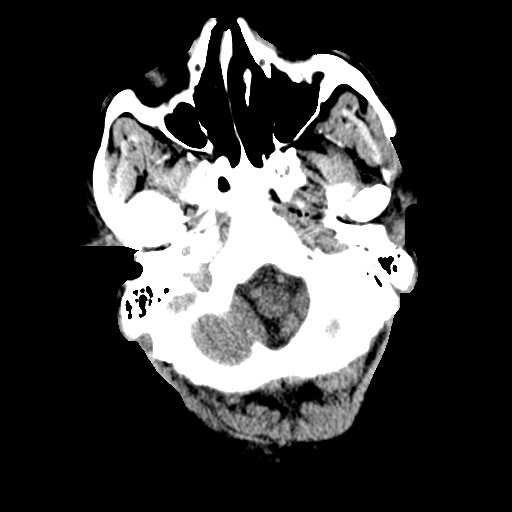
[im 5/32  bone]
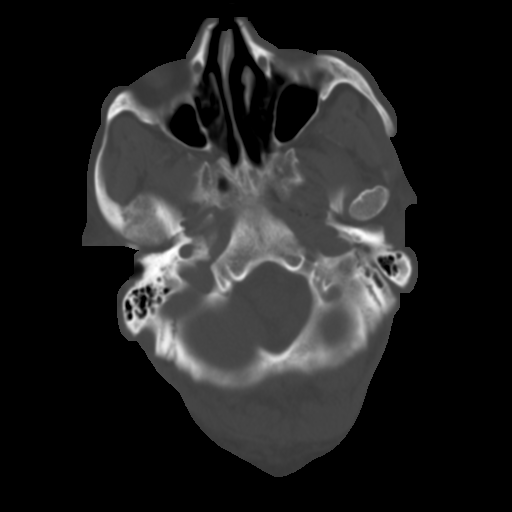
[im 9/32  brain]
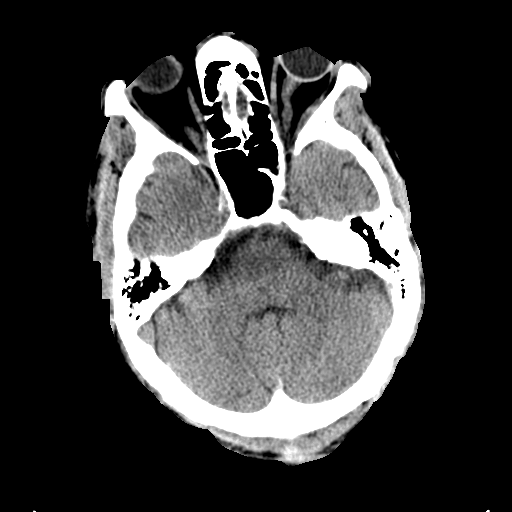
[im 14/32  brain]
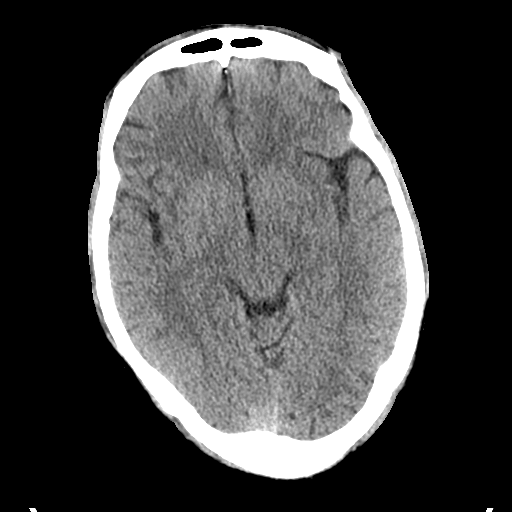
[im 18/32  brain]
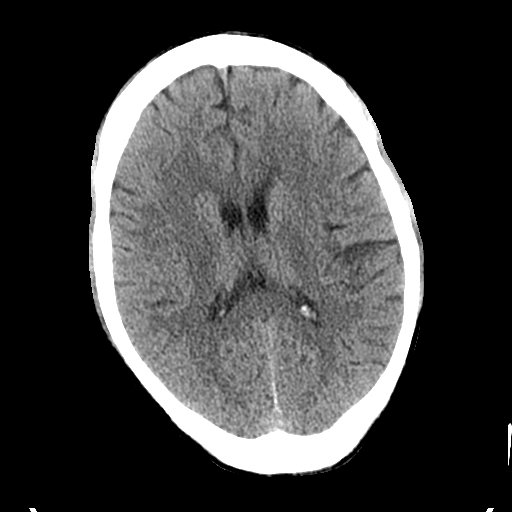
[im 23/32  brain]
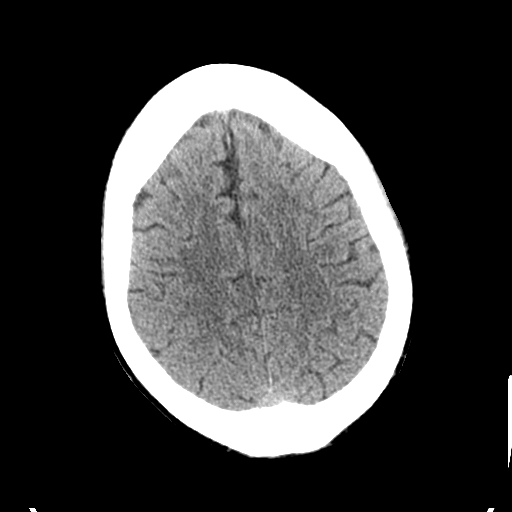
[im 23/32  bone]
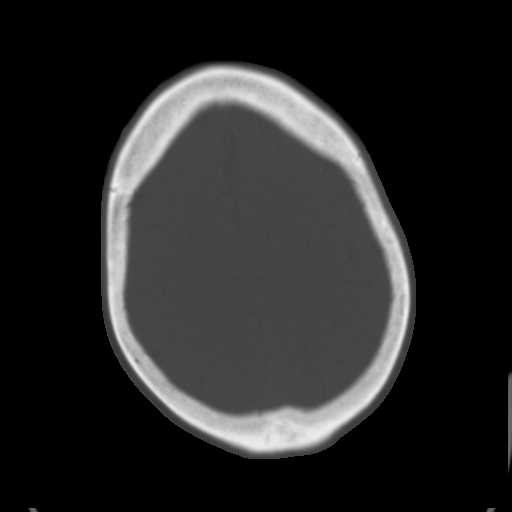
[im 27/32  brain]
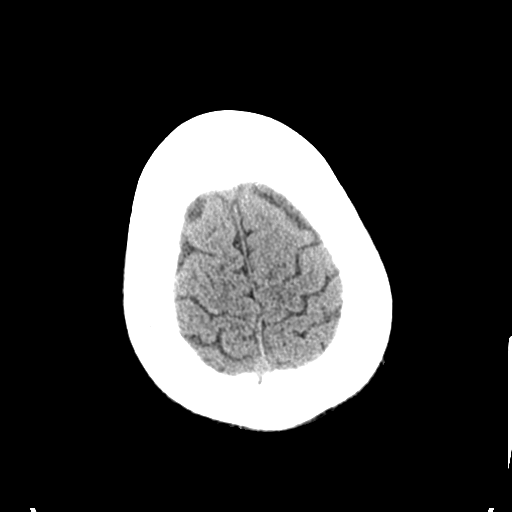

[Series 4: coronal soft tissue · coronal · 0.35mm/px · 3 of 81 slices shown]
[im 27/81  brain]
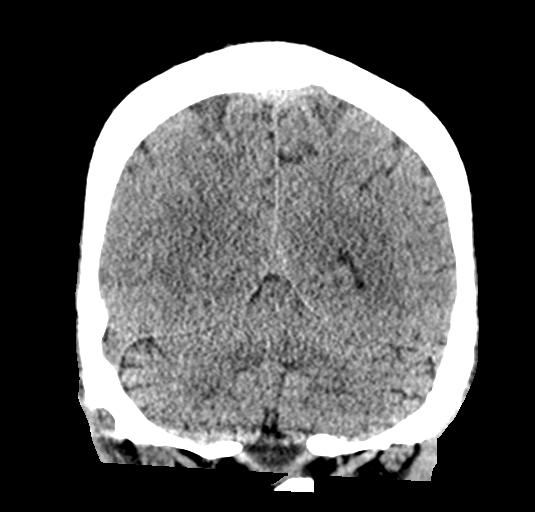
[im 36/81  brain]
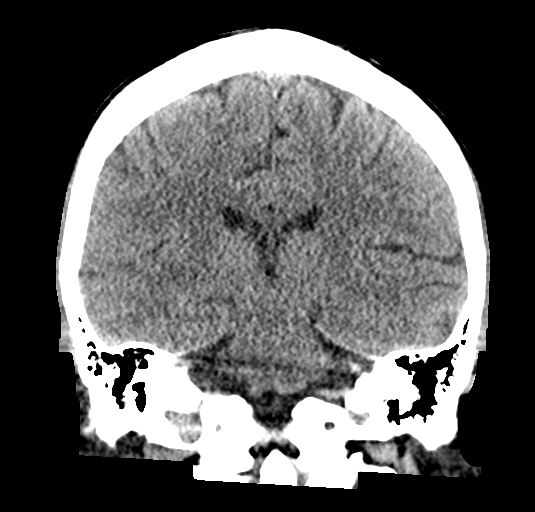
[im 45/81  brain]
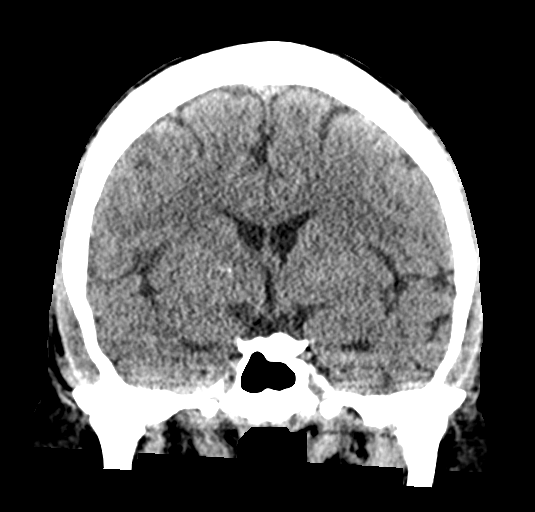

[Series 5: sagittal soft tissue · sagittal · 0.35mm/px · 3 of 60 slices shown]
[im 20/60  brain]
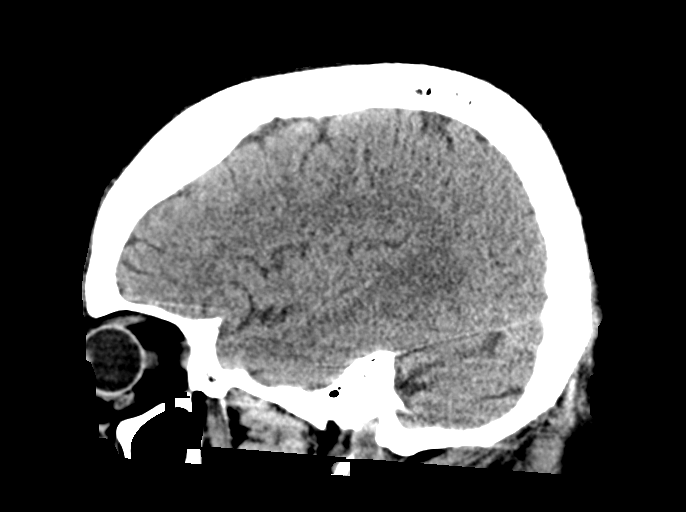
[im 30/60  brain]
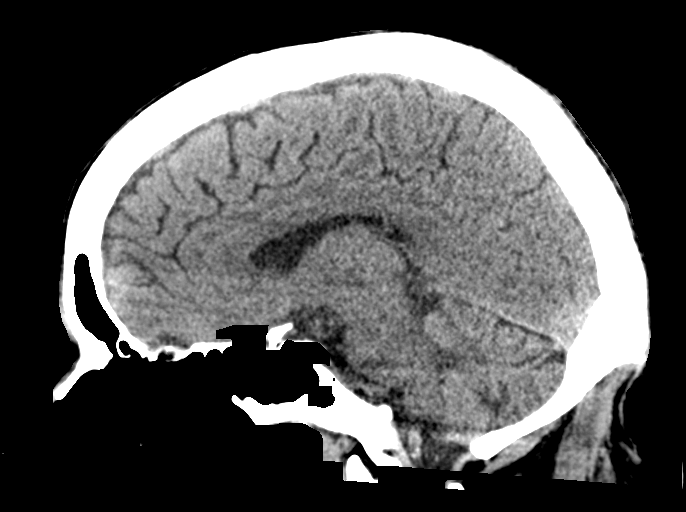
[im 40/60  brain]
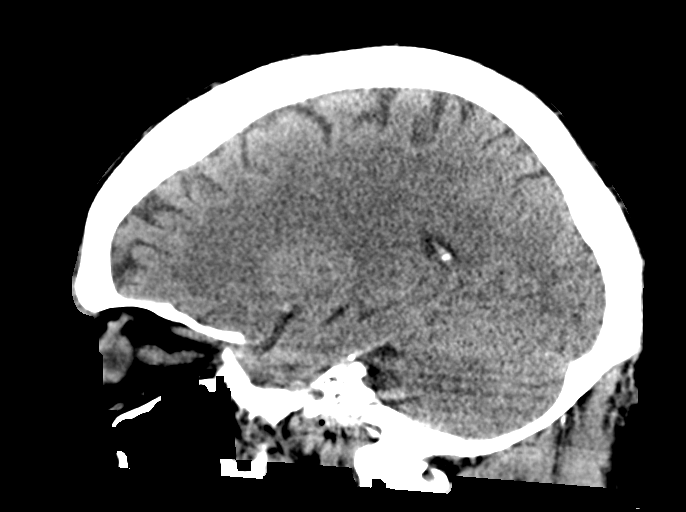

[16 of 47 positions shown; findings below may reference images not displayed]

FINDINGS: Brain: There is no acute intracranial hemorrhage, mass effect, or
edema. Gray-white differentiation is preserved. There is no
extra-axial fluid collection. Ventricles and sulci are within normal
limits in size and configuration.

Vascular: There is atherosclerotic calcification at the skull base.

Skull: Calvarium is unremarkable.

Sinuses/Orbits: No acute finding.

Other: None.
IMPRESSION: No acute intracranial abnormality.

## 2023-01-02 IMAGING — MR MR HEAD W/O CM
8 of 10 series · 37 of 48 positions shown · non-contrast
Comparison: Noncontrast CT head 1 day prior

CLINICAL DATA: Altered mental status, confusion

EXAM:
MRI HEAD WITHOUT CONTRAST
TECHNIQUE: Multiplanar, multiecho pulse sequences of the brain and surrounding
structures were obtained without intravenous contrast.

[Series 6: ax dwi_tracew · axial · 3.0mm · 0.71mm/px · z∈[-121,+42]mm · 7 of 56 slices shown]
[im 1/56]
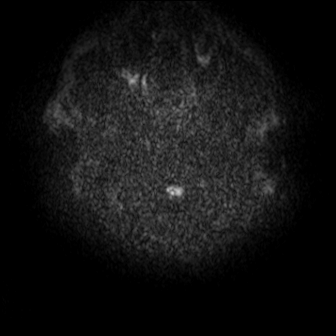
[im 10/56]
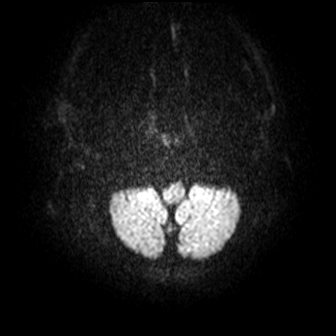
[im 19/56]
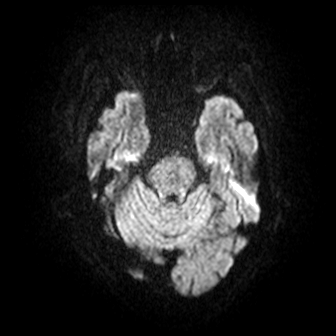
[im 28/56]
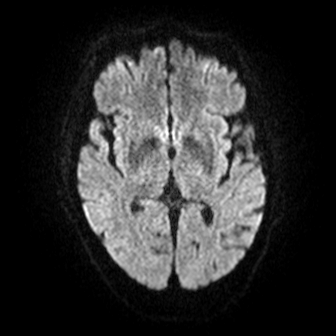
[im 37/56]
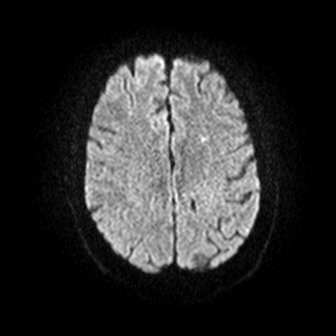
[im 46/56]
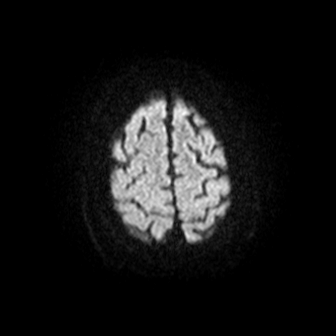
[im 56/56]
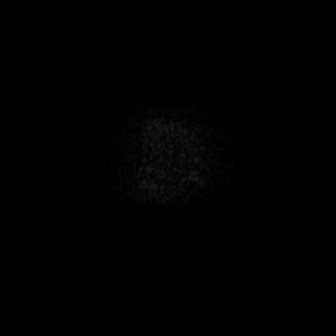

[Series 7: ax dwi_adc · axial · 3.0mm · 0.71mm/px · z∈[-121,+42]mm · 8 of 56 slices shown]
[im 1/56]
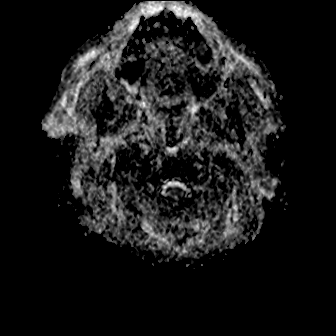
[im 8/56]
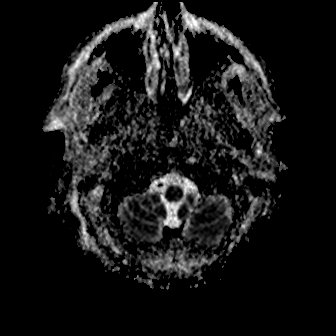
[im 16/56]
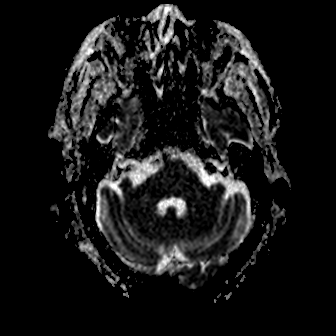
[im 24/56]
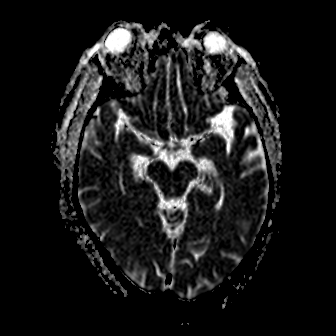
[im 32/56]
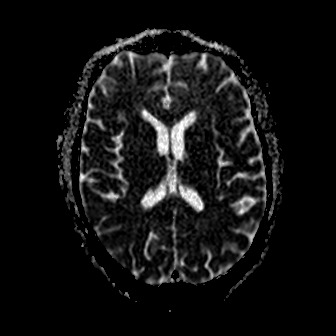
[im 40/56]
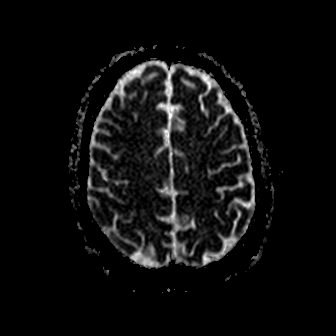
[im 48/56]
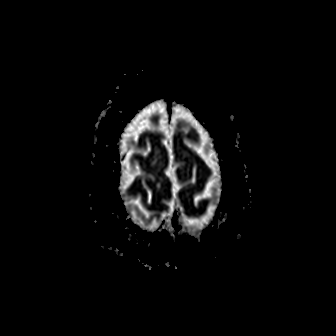
[im 56/56]
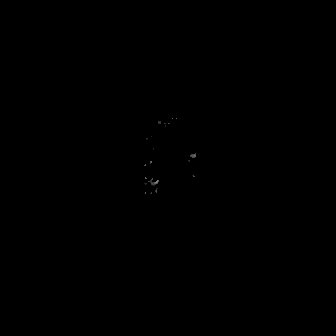

[Series 8: cor dwi_tracew · coronal · 5.0mm · 0.68mm/px · 3 of 38 slices shown]
[im 1/38]
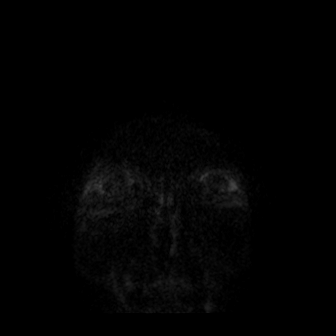
[im 10/38]
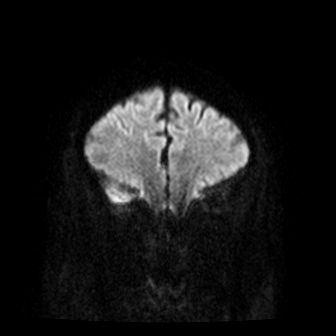
[im 19/38]
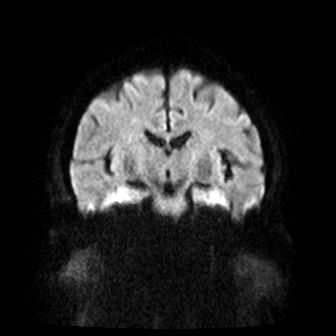

[Series 10: T1 · sagittal · 5.0mm · 0.47mm/px · 3 of 22 slices shown (1 of 2)]
[im 1/22]
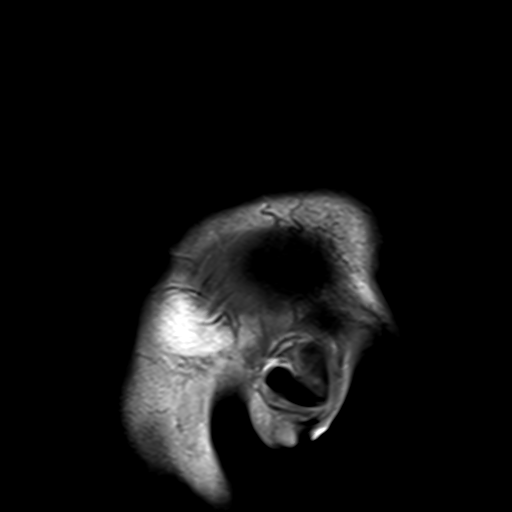
[im 11/22]
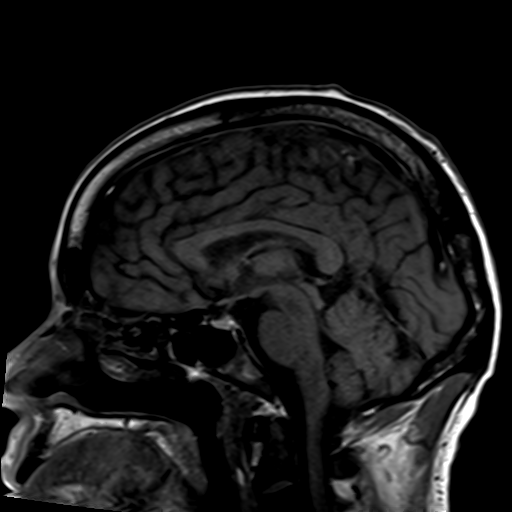
[im 22/22]
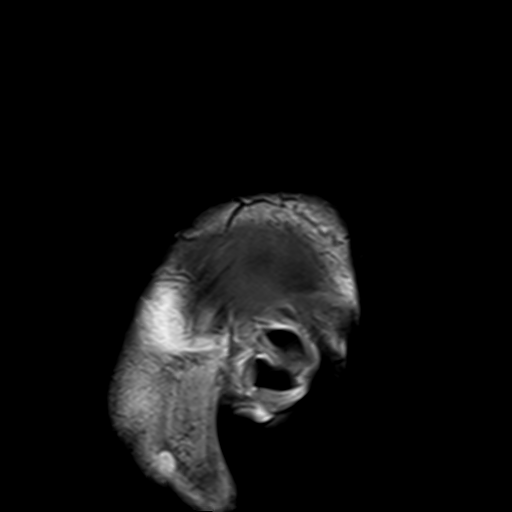

[Series 11: T2 · axial · 5.0mm · 0.45mm/px · z∈[-115,+38]mm · 4 of 27 slices shown (1 of 2)]
[im 1/27]
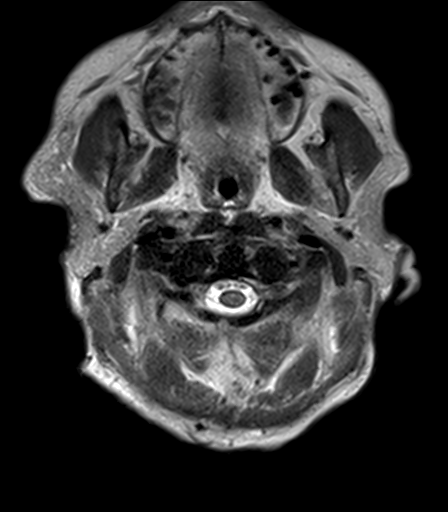
[im 9/27]
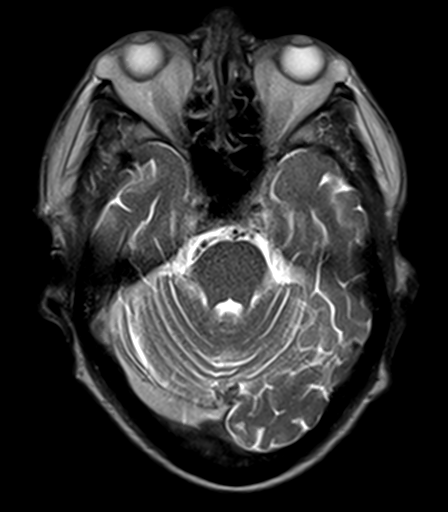
[im 18/27]
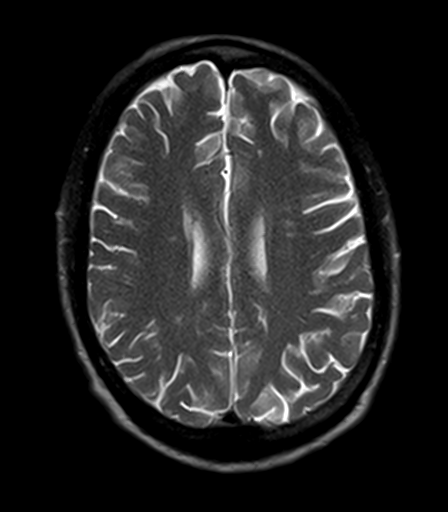
[im 27/27]
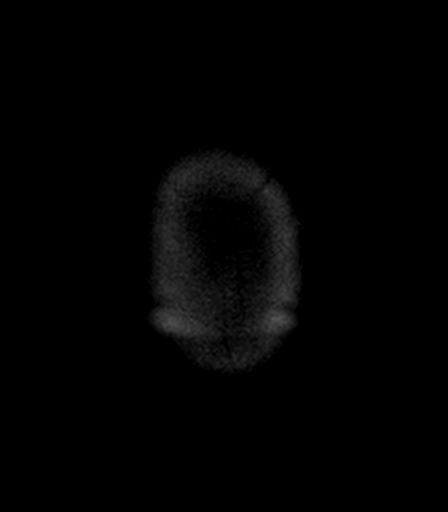

[Series 13: FLAIR · axial · 5.0mm · 1.20mm/px · z∈[-115,+38]mm · 4 of 27 slices shown]
[im 1/27]
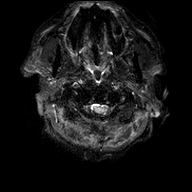
[im 9/27]
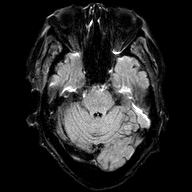
[im 18/27]
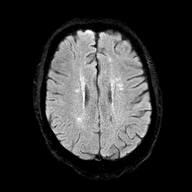
[im 27/27]
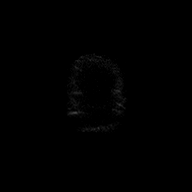

[Series 14: T1 · axial · 5.0mm · 0.90mm/px · z∈[-115,+38]mm · 4 of 27 slices shown (2 of 2)]
[im 1/27]
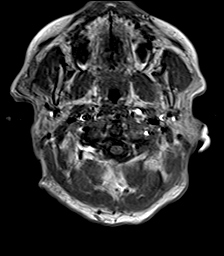
[im 9/27]
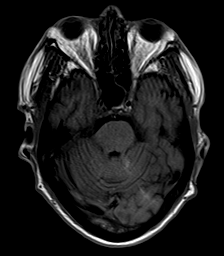
[im 18/27]
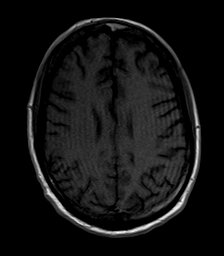
[im 27/27]
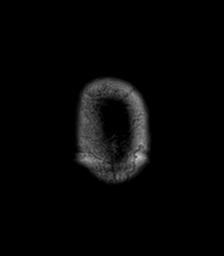

[Series 15: T2 · coronal · 5.0mm · 0.45mm/px · 4 of 31 slices shown (2 of 2)]
[im 1/31]
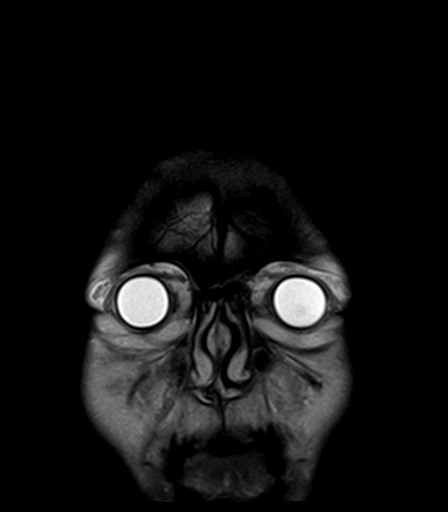
[im 11/31]
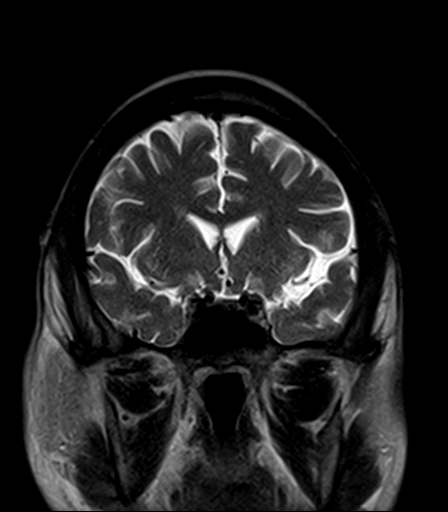
[im 21/31]
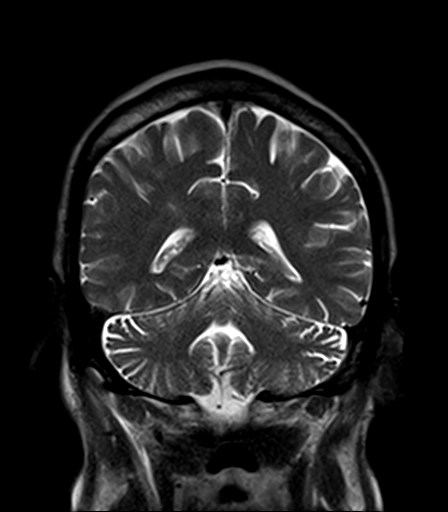
[im 31/31]
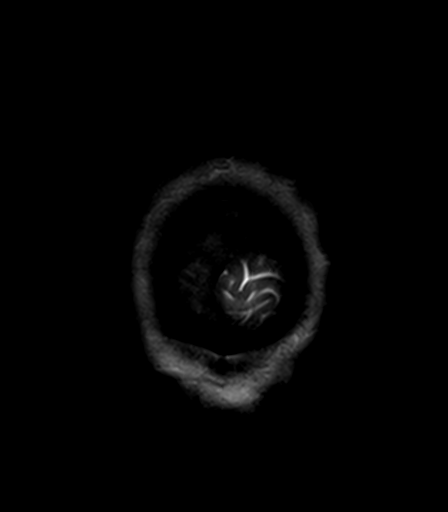

[37 of 48 positions shown; findings below may reference images not displayed]

FINDINGS: Brain: There is a punctate focus of diffusion restriction in the
left posterior temporal lobe cortex with appreciable low ADC signal.
A similar more ill-defined focus of elevated DWI signal with
apparent low ADC signal is seen in the right insular cortex.
Additional ill-defined foci of elevated DWI signal in the bilateral
corona radiata are seen without corresponding low ADC signal favored
to reflect T2 shine through artifact.

There is no evidence of large vessel territorial infarct. There is
no evidence of acute intracranial hemorrhage. The ventricles are not
enlarged. Scattered foci of FLAIR signal abnormality in the
subcortical and periventricular white matter are nonspecific but
likely reflect sequela of mild chronic white matter microangiopathy.

There is no mass lesion.  There is no midline shift.

Vascular: Normal flow voids.

Skull and upper cervical spine: Marrow signal in the upper cervical
spine is diffusely abnormal, as seen on prior thoracolumbar spine
MRIs, nonspecific.

Sinuses/Orbits: The paranasal sinuses are clear. The globes and
orbits are unremarkable.

Other: None.
IMPRESSION: 1. Probable punctate acute infarcts in the posterior left temporal
lobe and right insular cortex. Differential includes T2 shine
through artifact.
2. Mild chronic white matter microangiopathy.

## 2023-01-02 IMAGING — US US CAROTID DUPLEX BILAT
1 series · 13 of 24 positions shown · non-contrast
Comparison: None.

CLINICAL DATA: Stroke.

EXAM:
BILATERAL CAROTID DUPLEX ULTRASOUND
TECHNIQUE: Gray scale imaging, color Doppler and duplex ultrasound were
performed of bilateral carotid and vertebral arteries in the neck.

[Series 1: us carotid bilateral · 13 of 80 slices shown]
[im 1/80]
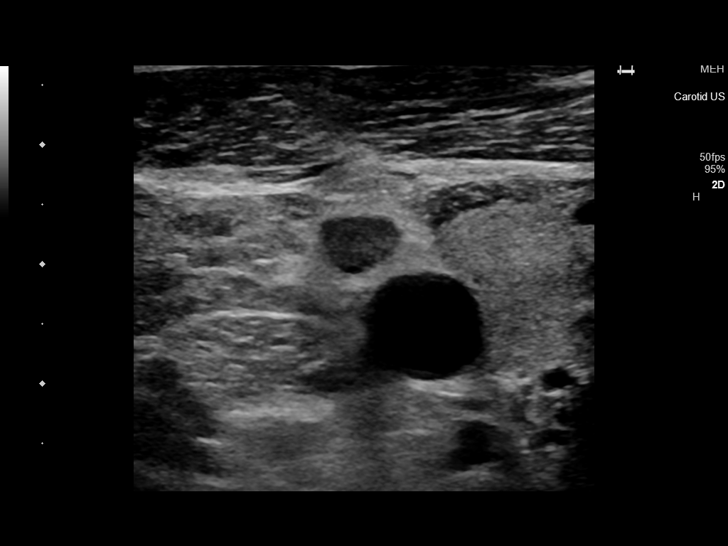
[im 7/80]
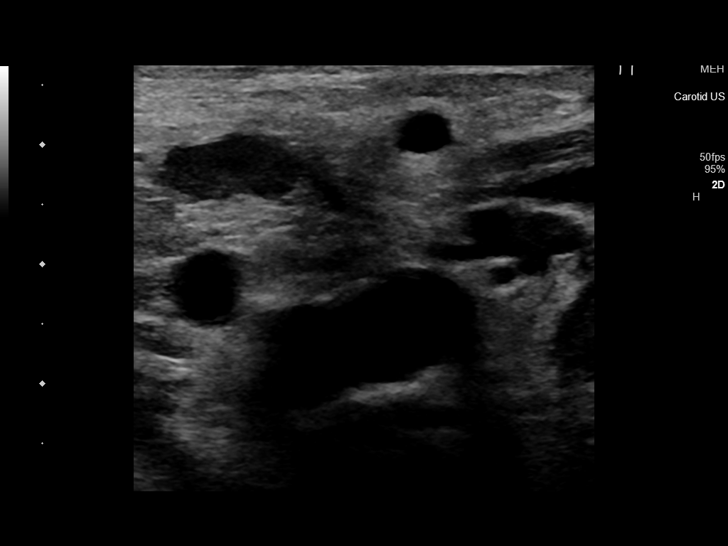
[im 14/80]
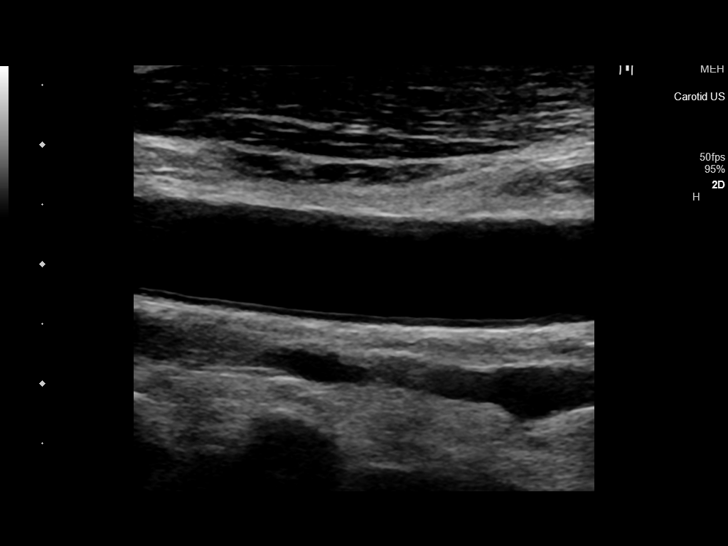
[im 21/80]
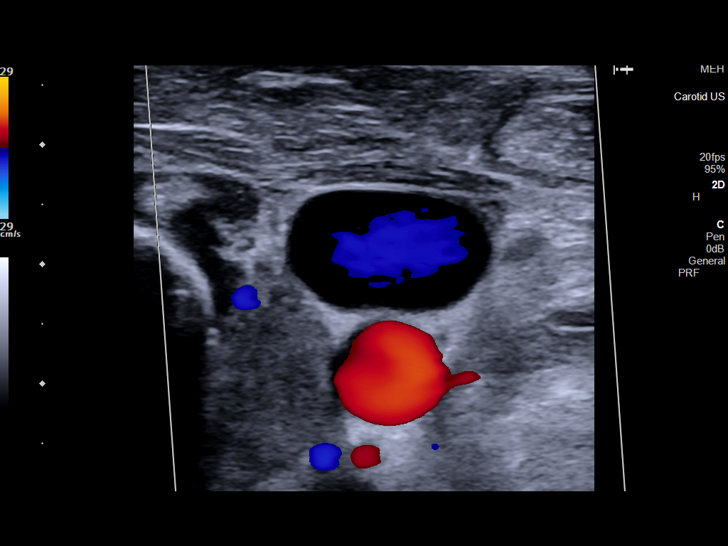
[im 28/80]
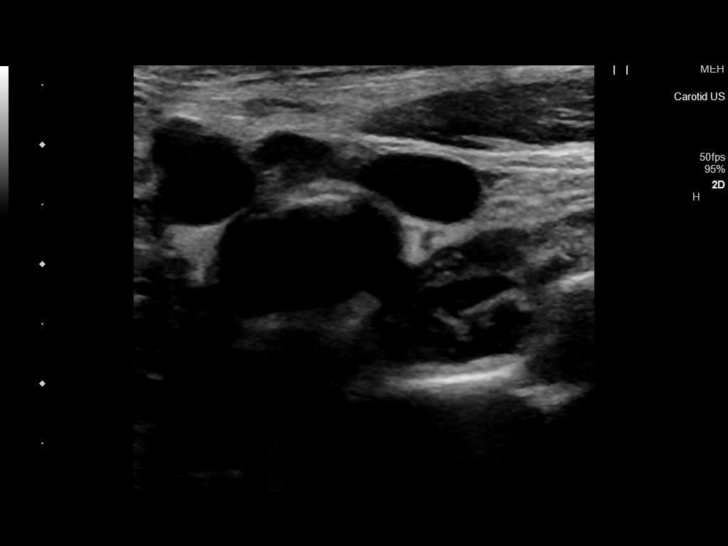
[im 35/80]
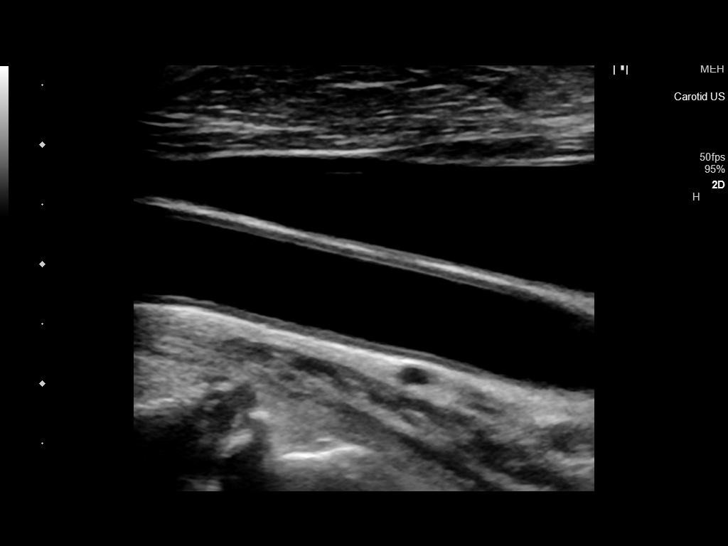
[im 42/80]
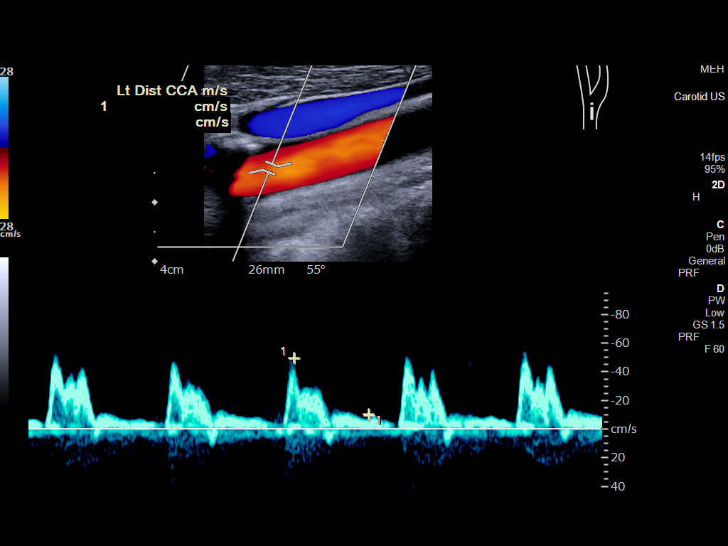
[im 45/80]
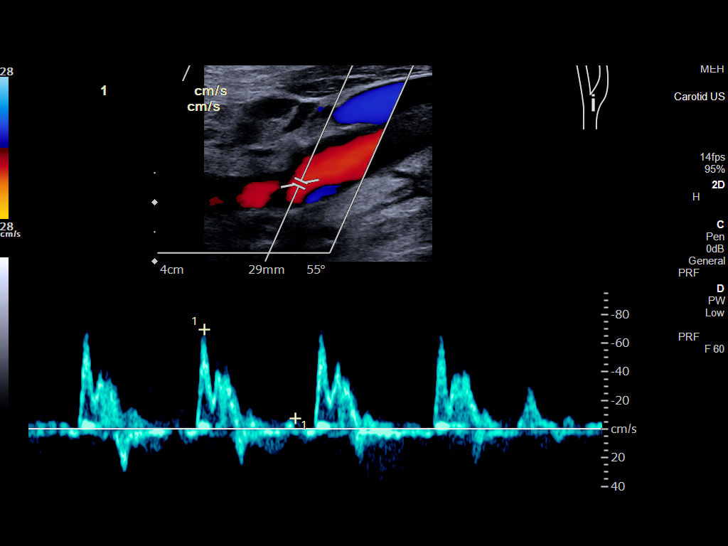
[im 52/80]
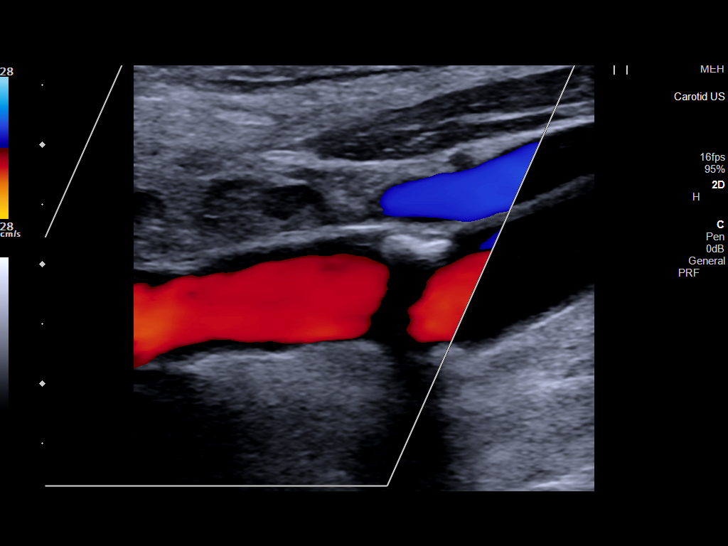
[im 59/80]
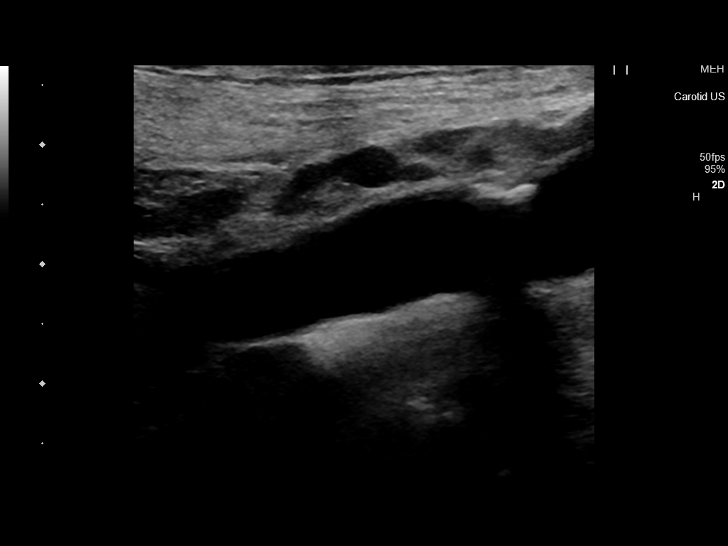
[im 66/80]
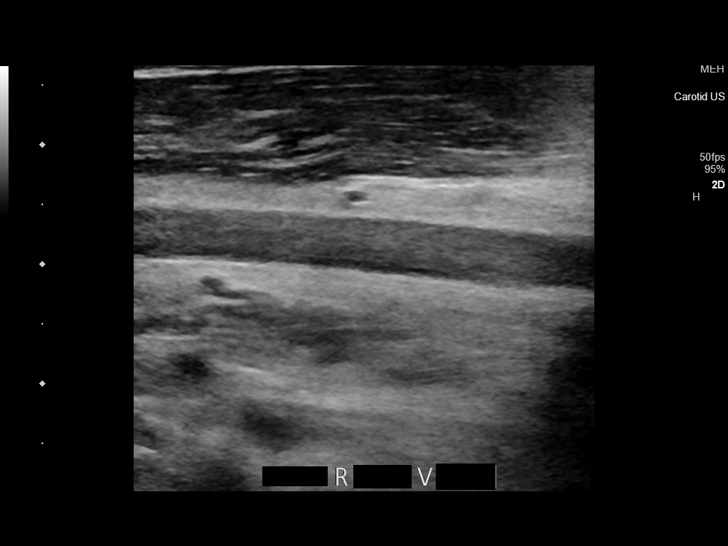
[im 73/80]
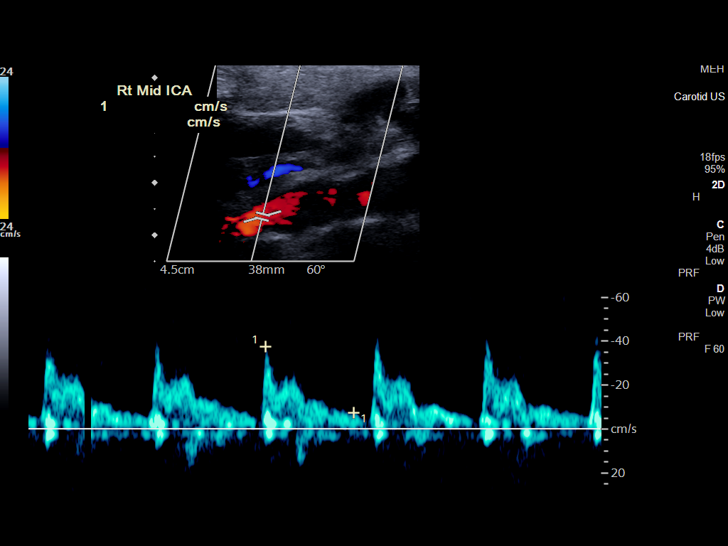
[im 80/80]
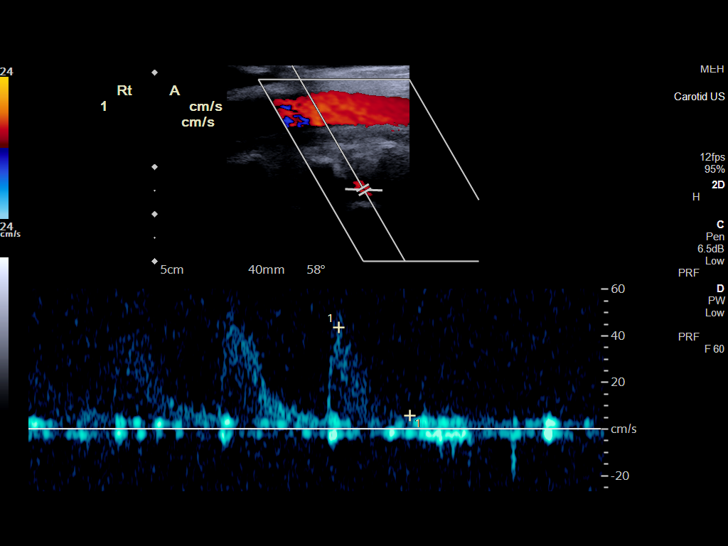

[13 of 24 positions shown; findings below may reference images not displayed]

FINDINGS: Criteria: Quantification of carotid stenosis is based on velocity
parameters that correlate the residual internal carotid diameter
with NASCET-based stenosis levels, using the diameter of the distal
internal carotid lumen as the denominator for stenosis measurement.

The following velocity measurements were obtained:

RIGHT

ICA: 55/8 cm/sec

CCA: 94/9 cm/sec

SYSTOLIC ICA/CCA RATIO:

ECA: 29 cm/sec

LEFT

ICA: 52/13 cm/sec

CCA: 99/1 cm/sec

SYSTOLIC ICA/CCA RATIO:

ECA: 65 cm/sec

RIGHT CAROTID ARTERY: Right carotid arteries are patent without
significant stenosis. Normal waveforms and velocities in the
internal carotid artery. External carotid artery is patent with
normal waveform.

RIGHT VERTEBRAL ARTERY: Antegrade flow and normal waveform in the
right vertebral artery.

LEFT CAROTID ARTERY: Small amount of echogenic plaque at the left
carotid bulb. Normal waveforms and velocities in the internal
carotid artery. External carotid artery is patent with normal
waveform.

LEFT VERTEBRAL ARTERY: Antegrade flow and normal waveform in the
left vertebral artery.

Other: Right internal jugular vein is small and echogenic and
suggestive for chronic occlusion. Evidence for collateral venous
structures in the right neck compatible with a chronic right
internal jugular vein occlusion.
IMPRESSION: 1. Right carotid arteries are patent without significant plaque or
stenosis.
2. Small amount of plaque at the left carotid bulb. Estimated degree
of stenosis in left internal carotid artery is less than 50%.
3. Patent vertebral arteries bilaterally.
4. Evidence for chronic occlusion of the right internal jugular
vein.

## 2023-01-05 IMAGING — DX DG CHEST 1V PORT
1 series · 2 of 2 positions shown · non-contrast
Comparison: 08/11/2021.

CLINICAL DATA: Post PICC line placement in a 59-year-old male.

EXAM:
PORTABLE CHEST 1 VIEW

[Series 1: chest ap · 0.14mm/px · 2 of 2 slices shown]
[im 1/2]
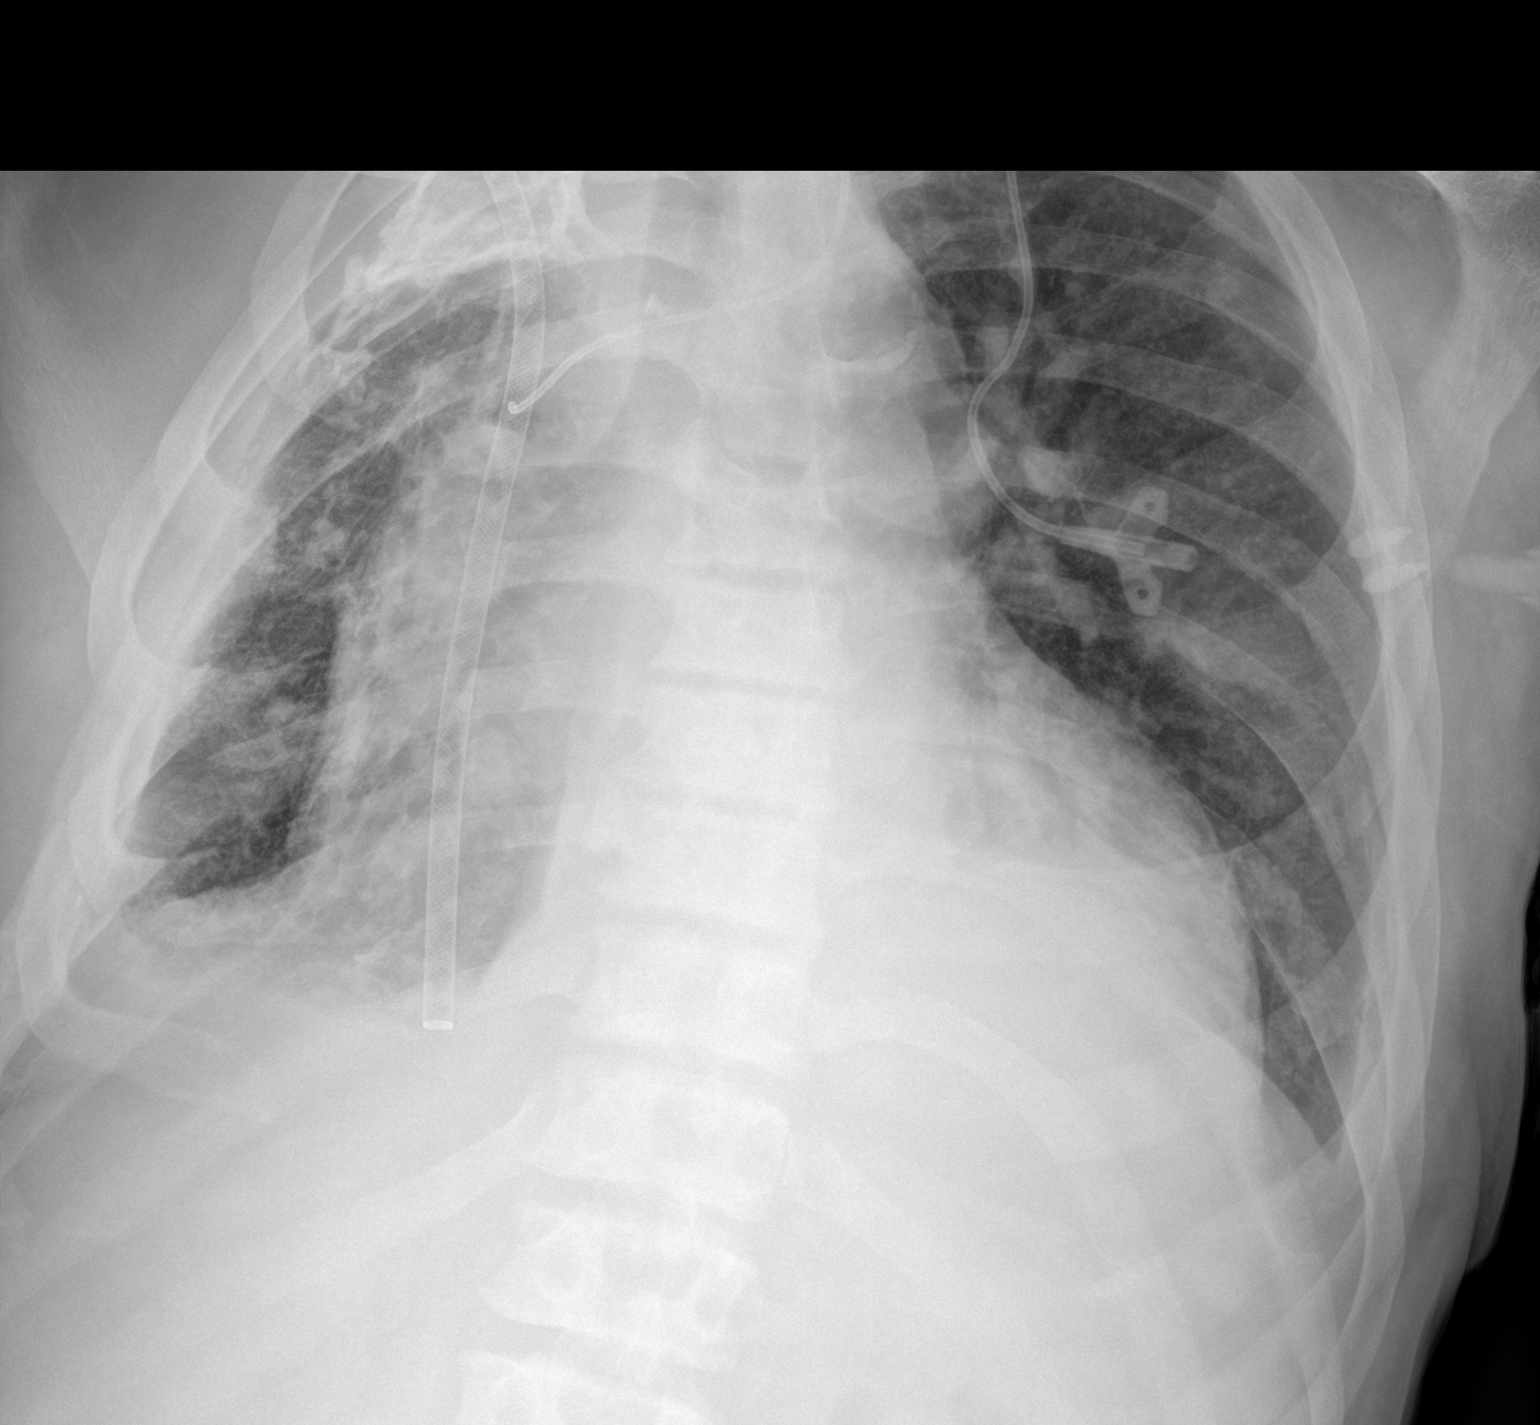
[im 2/2]
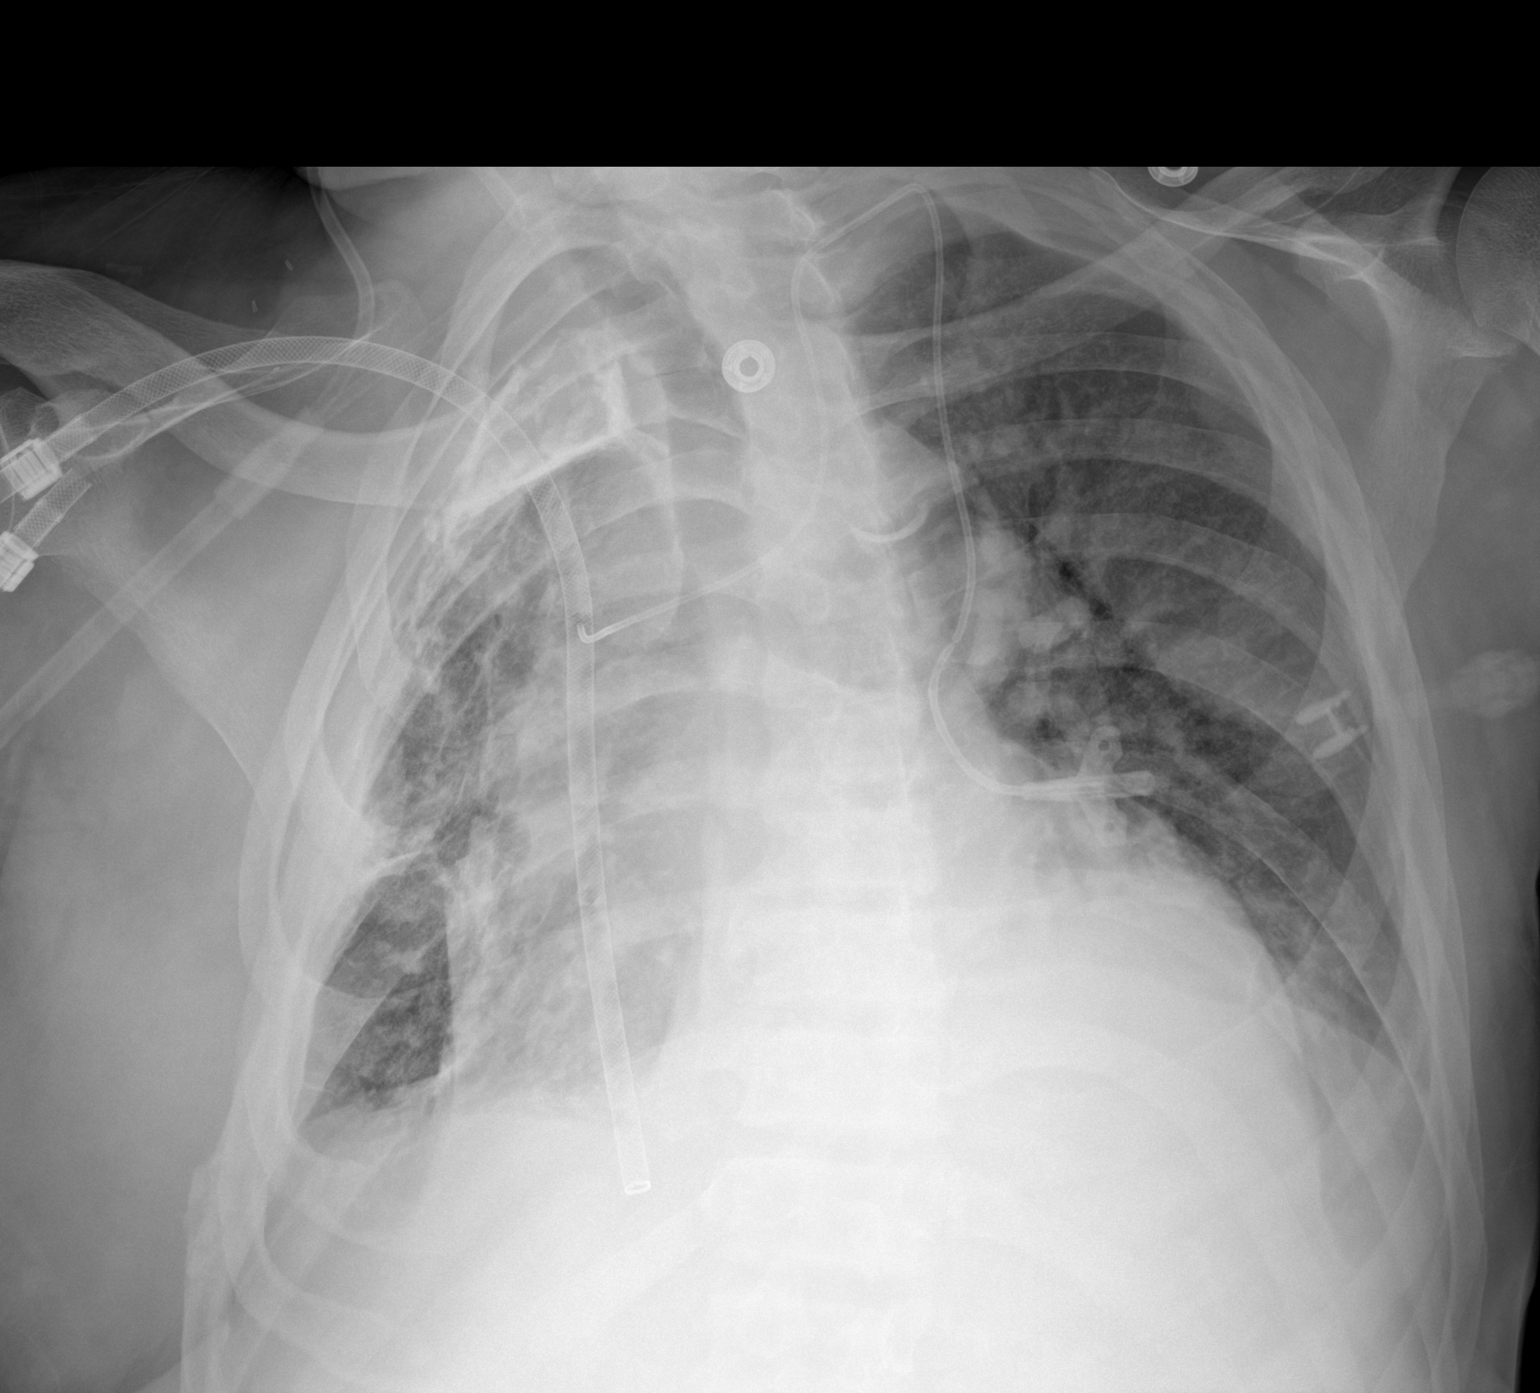

[2 of 2 positions shown; findings below may reference images not displayed]

FINDINGS: Dialysis catheter again terminates in the mid to lower RIGHT atrium.

Interval placement of a LEFT-sided central venous access device, tip
orientation suggest placement within the azygous vein. Correlation
with recent imaging report from placement states this is intended
site of this catheter.

Cardiomediastinal contours with stable cardiac enlargement. Pleural
calcification and pleural fluid in the RIGHT chest with similar
appearance. No signs of new lobar consolidation or pneumothorax.

Signs of renal osteodystrophy without acute skeletal process on
limited assessment. Evidence of prior trauma to RIGHT ribs as
before.
IMPRESSION: Dialysis catheter again terminates in the mid to lower RIGHT atrium.

Interval placement of a LEFT-sided central venous access device, tip
orientation suggests placement within the azygous vein. This appears
to be the intended placement due to central venous occlusion
involving distal SVC as outlined in the previous interventional
radiology report.

Otherwise stable appearance of the chest with chronic volume loss
and pleuroparenchymal scarring/calcification in the RIGHT chest.

## 2023-01-05 IMAGING — XA IR FLUORO GUIDE CV LINE*L*
1 series · 7 of 7 positions shown · non-contrast
Comparison: none

INDICATION: 59-year-old gentleman who requires long-term central venous access
for antibiotic therapy returns to IR for placement of retracted left
power line. Previous placement was quite difficult due to extensive
central venous stenosis. Catheter tip was left in azygous vein.

[Series 3: interv standard · 4 acquisitions, 7 frames shown]
[im 1/4]
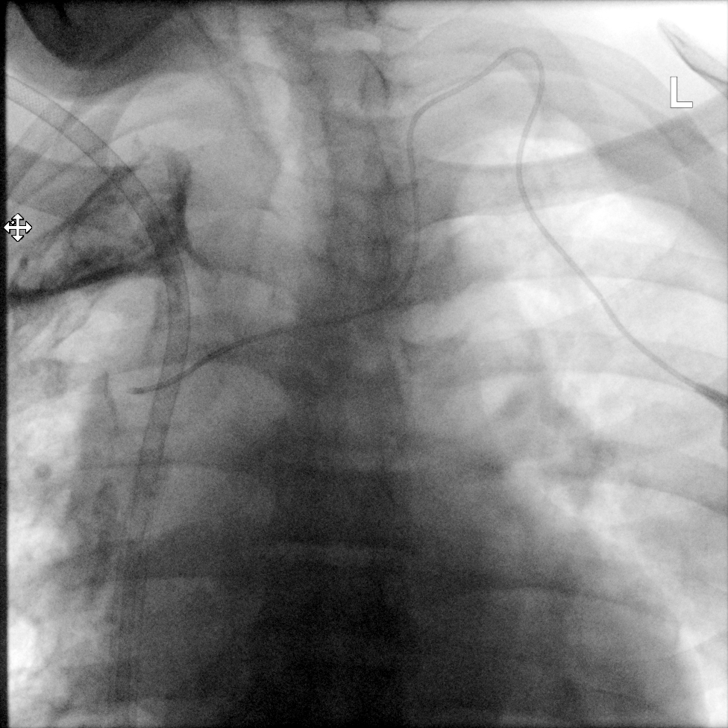
[im 2/4]
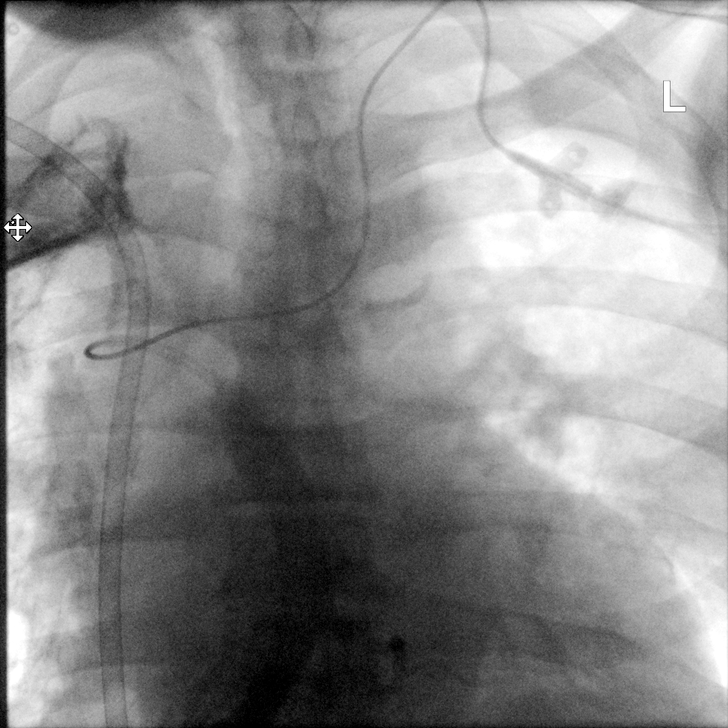
[im 3/4]
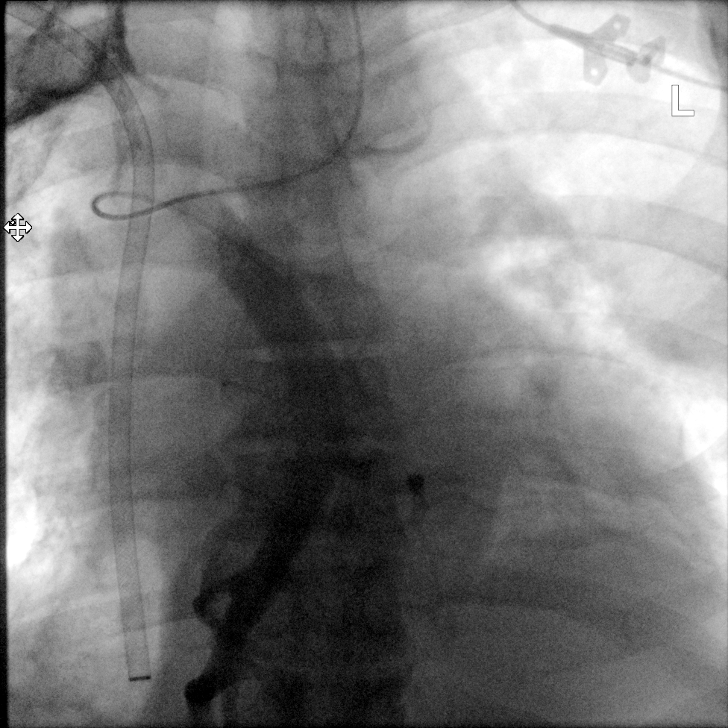
[im 3/4]
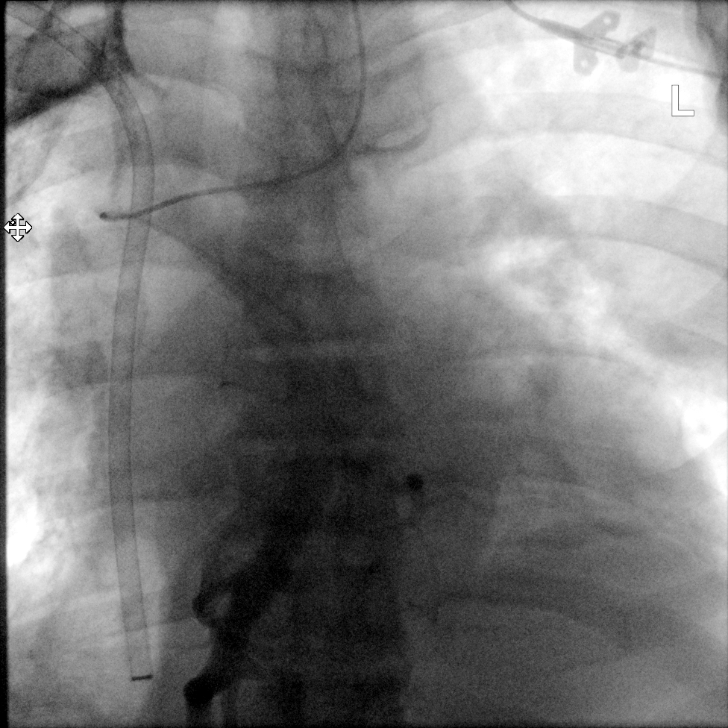
[im 3/4]
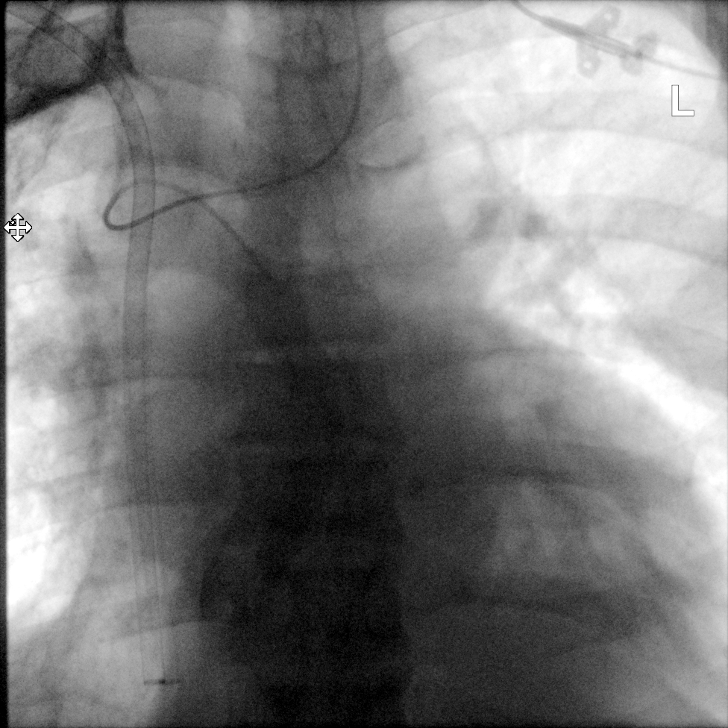
[im 3/4]
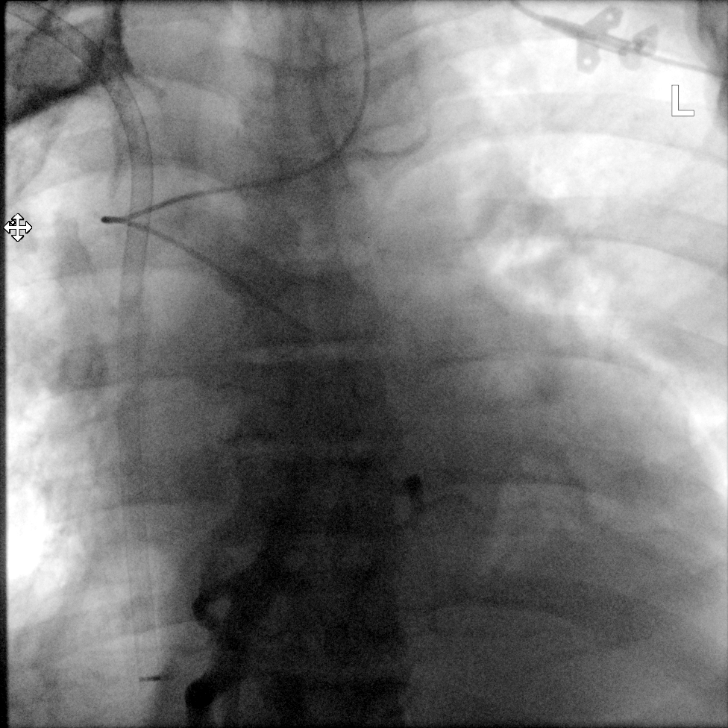
[im 4/4]
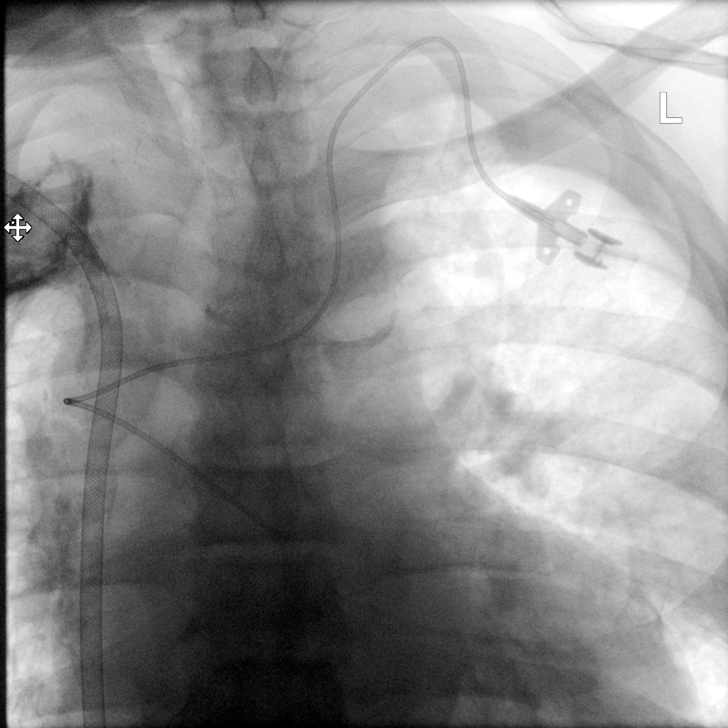

[7 of 7 positions shown; findings below may reference images not displayed]

EXAM:
Fluoroscopy guided replacement of retracted power line

MEDICATIONS:
None

ANESTHESIA/SEDATION:
None

FLUOROSCOPY TIME:  Fluoroscopy Time: 1 minutes 42 seconds (13.2
mGy).

COMPLICATIONS:
None immediate.

PROCEDURE:
Informed written consent was obtained from the patient's family
after a thorough discussion of the procedural risks, benefits and
alternatives. All questions were addressed. Maximal Sterile Barrier
Technique was utilized including caps, mask, sterile gowns, sterile
gloves, sterile drape, hand hygiene and skin antiseptic. A timeout
was performed prior to the initiation of the procedure.

Patient positioned supine on the procedure table. The external
segment of the retracted left anterior power line and surrounding
skin prepped and draped in usual fashion. Scout image demonstrated
the catheter tip to be slightly retracted. The catheter did aspirate
after removing the retention suture. Given that the cuff was already
exposed, decision was made to replace the single-lumen power line.

The existing power line was removed over 0.035 inch stiff Glidewire.
New power line was cut to same length of 34 cm and inserted over the
stiff glidewire. The tip was positioned in the azygous vein at the
level of the mid chest. The catheter aspirated and flushed well.

The catheter was secured to skin with silk suture and covered with
bio patch and sterile dressing.
IMPRESSION: Successful exchange of retracted left IJ power line. New Powerline
ready for use.

Patient has extensive central venous occlusion and the only central
venous structure amenable to catheter placement was the azygous
vein. If this catheter becomes nonfunctional or is retracted ,
contrast-enhanced chest CT should be performed to determine degree
of venous stenosis. If clinically warranted, venous recanalization
may be considered. A venous recanalization is not possible, and the
patient still requires central venous access, femoral approach
central venous catheter should be considered.

## 2023-01-05 IMAGING — CT CT HEAD W/O CM
2 series · 15 of 30 positions shown, 17 images · non-contrast
Comparison: 08/25/2021

CLINICAL DATA: Neuro deficit, stroke suspected

EXAM:
CT HEAD WITHOUT CONTRAST
TECHNIQUE: Contiguous axial images were obtained from the base of the skull
through the vertex without intravenous contrast.

[Series 6: ax head wo · axial · 0.40mm/px · z∈[+331,+447]mm · 7 of 34 slices shown, 9 images]
[im 5/34  brain]
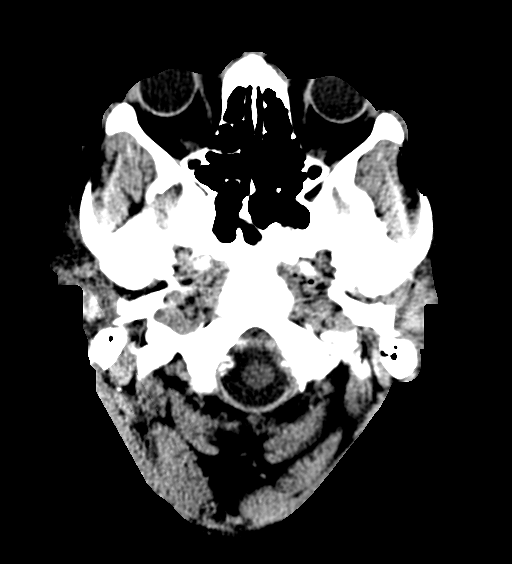
[im 5/34  bone]
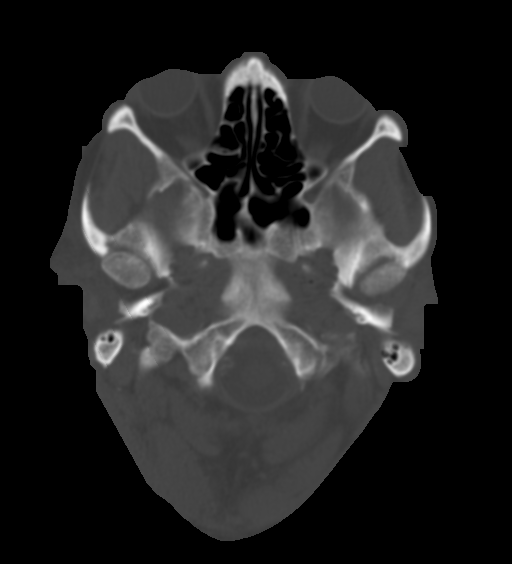
[im 9/34  brain]
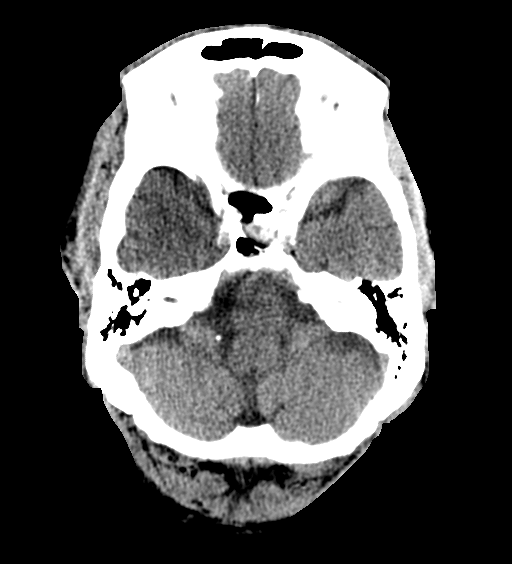
[im 13/34  brain]
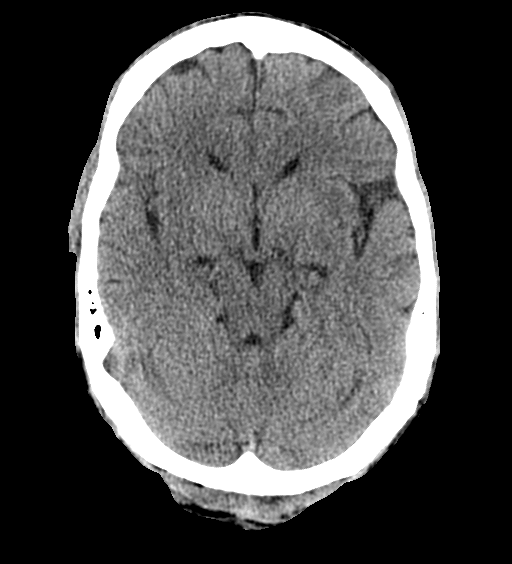
[im 17/34  brain]
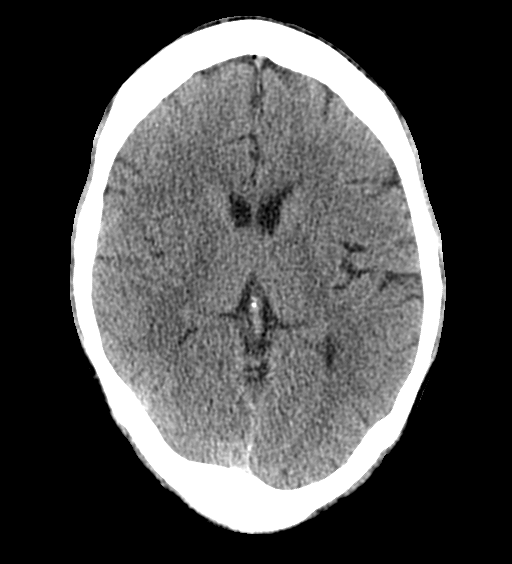
[im 21/34  brain]
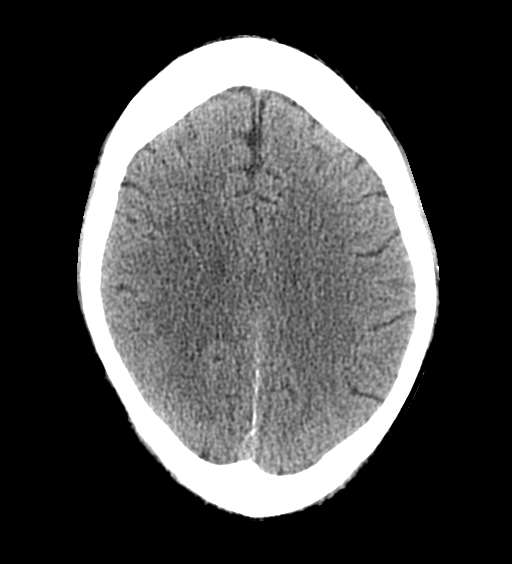
[im 21/34  bone]
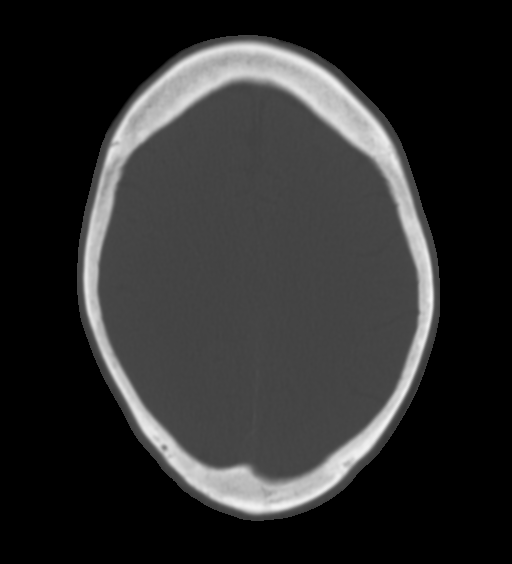
[im 25/34  brain]
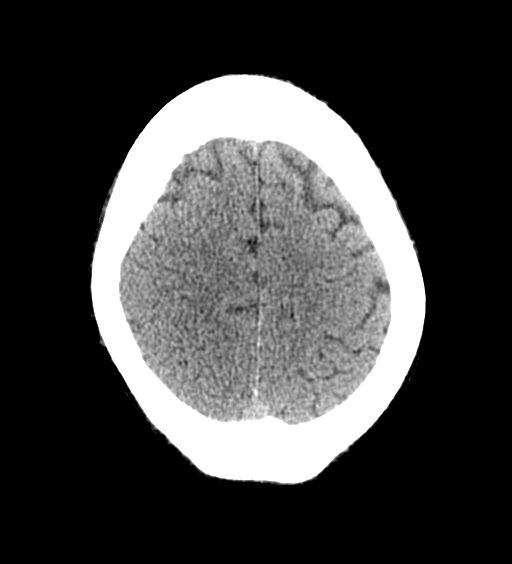
[im 29/34  brain]
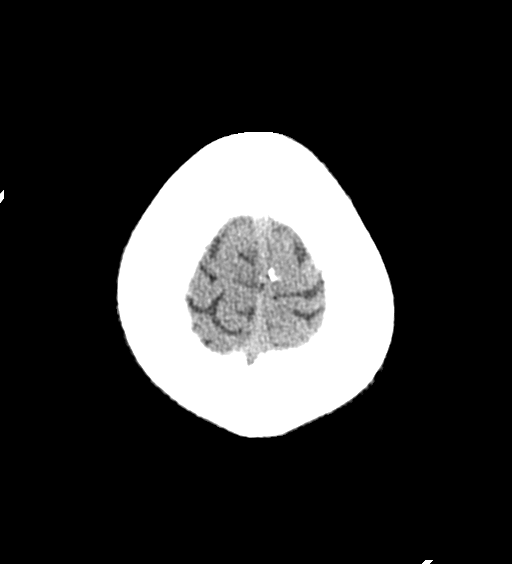

[Series 7: ax head bone · axial · 0.40mm/px · z∈[+315,+443]mm · 8 of 85 slices shown]
[im 9/85  bone]
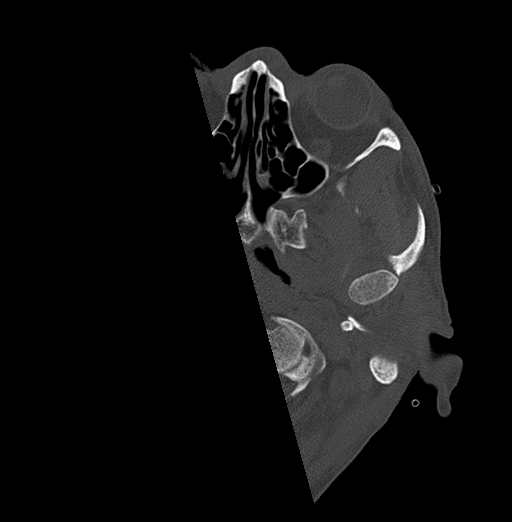
[im 17/85  bone]
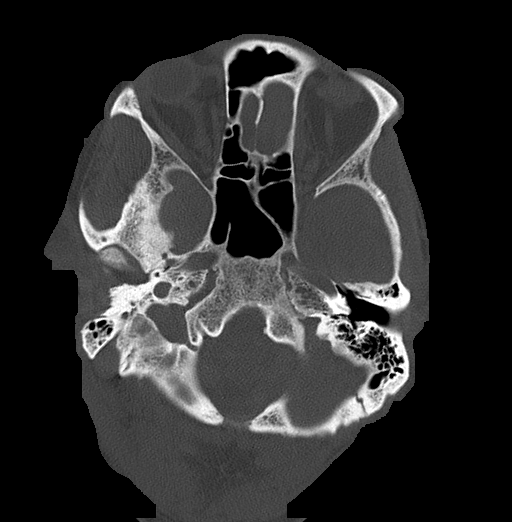
[im 26/85  bone]
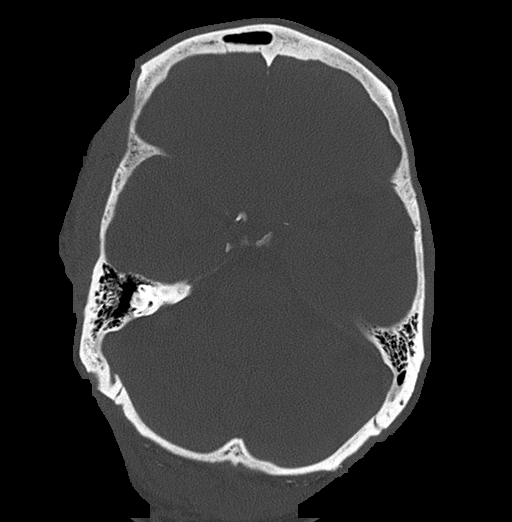
[im 38/85  bone]
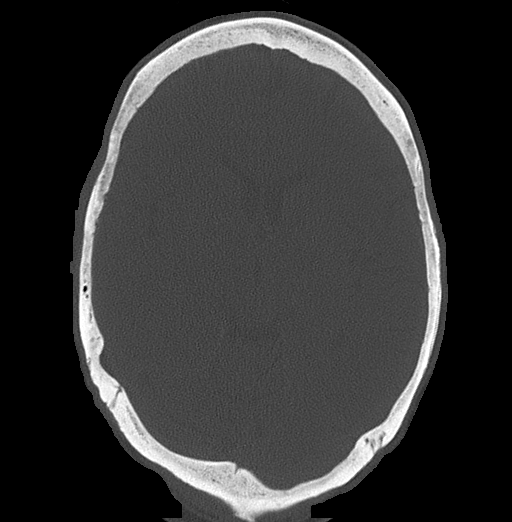
[im 47/85  bone]
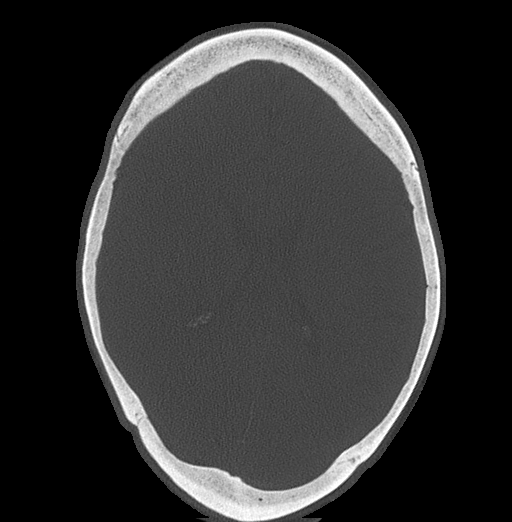
[im 59/85  bone]
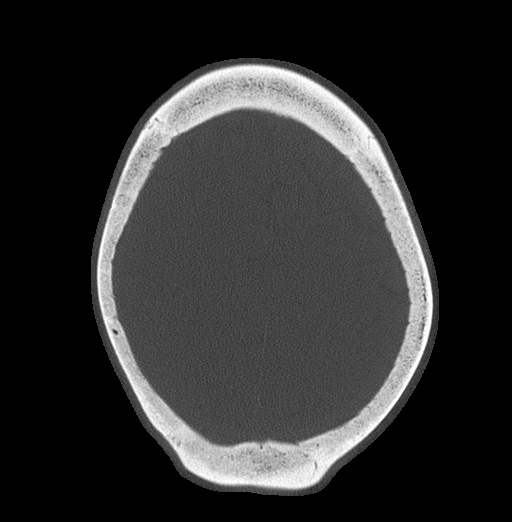
[im 68/85  bone]
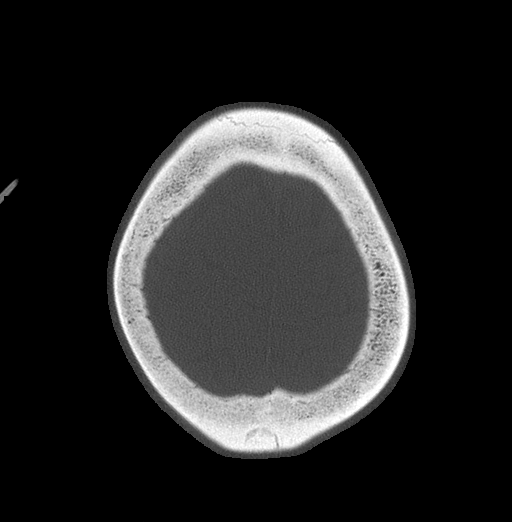
[im 76/85  bone]
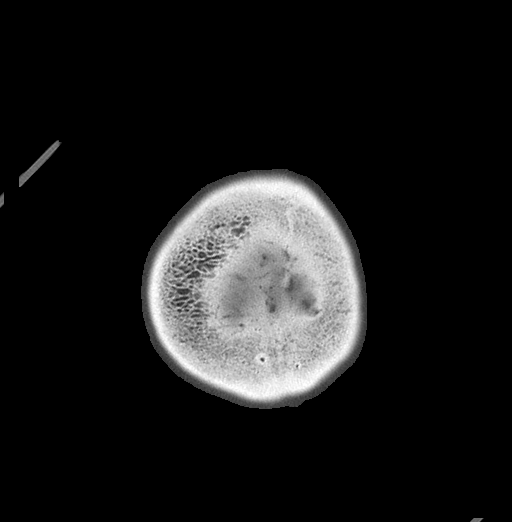

[15 of 30 positions shown; findings below may reference images not displayed]

FINDINGS: Brain: No evidence of acute infarction, hemorrhage, hydrocephalus,
extra-axial collection or mass lesion/mass effect.

Vascular: No hyperdense vessel.

Skull: Negative for fracture or focal lesion.

Sinuses/Orbits: No acute finding.

Other: The mastoids are well aerated.
IMPRESSION: No acute intracranial process.

## 2023-01-07 IMAGING — DX DG ABDOMEN 1V
1 series · 1 of 1 positions shown · non-contrast
Comparison: CT abdomen pelvis dated November 13, 2019.

CLINICAL DATA: Enteric tube placement.

EXAM:
ABDOMEN - 1 VIEW

[abdomen supine]
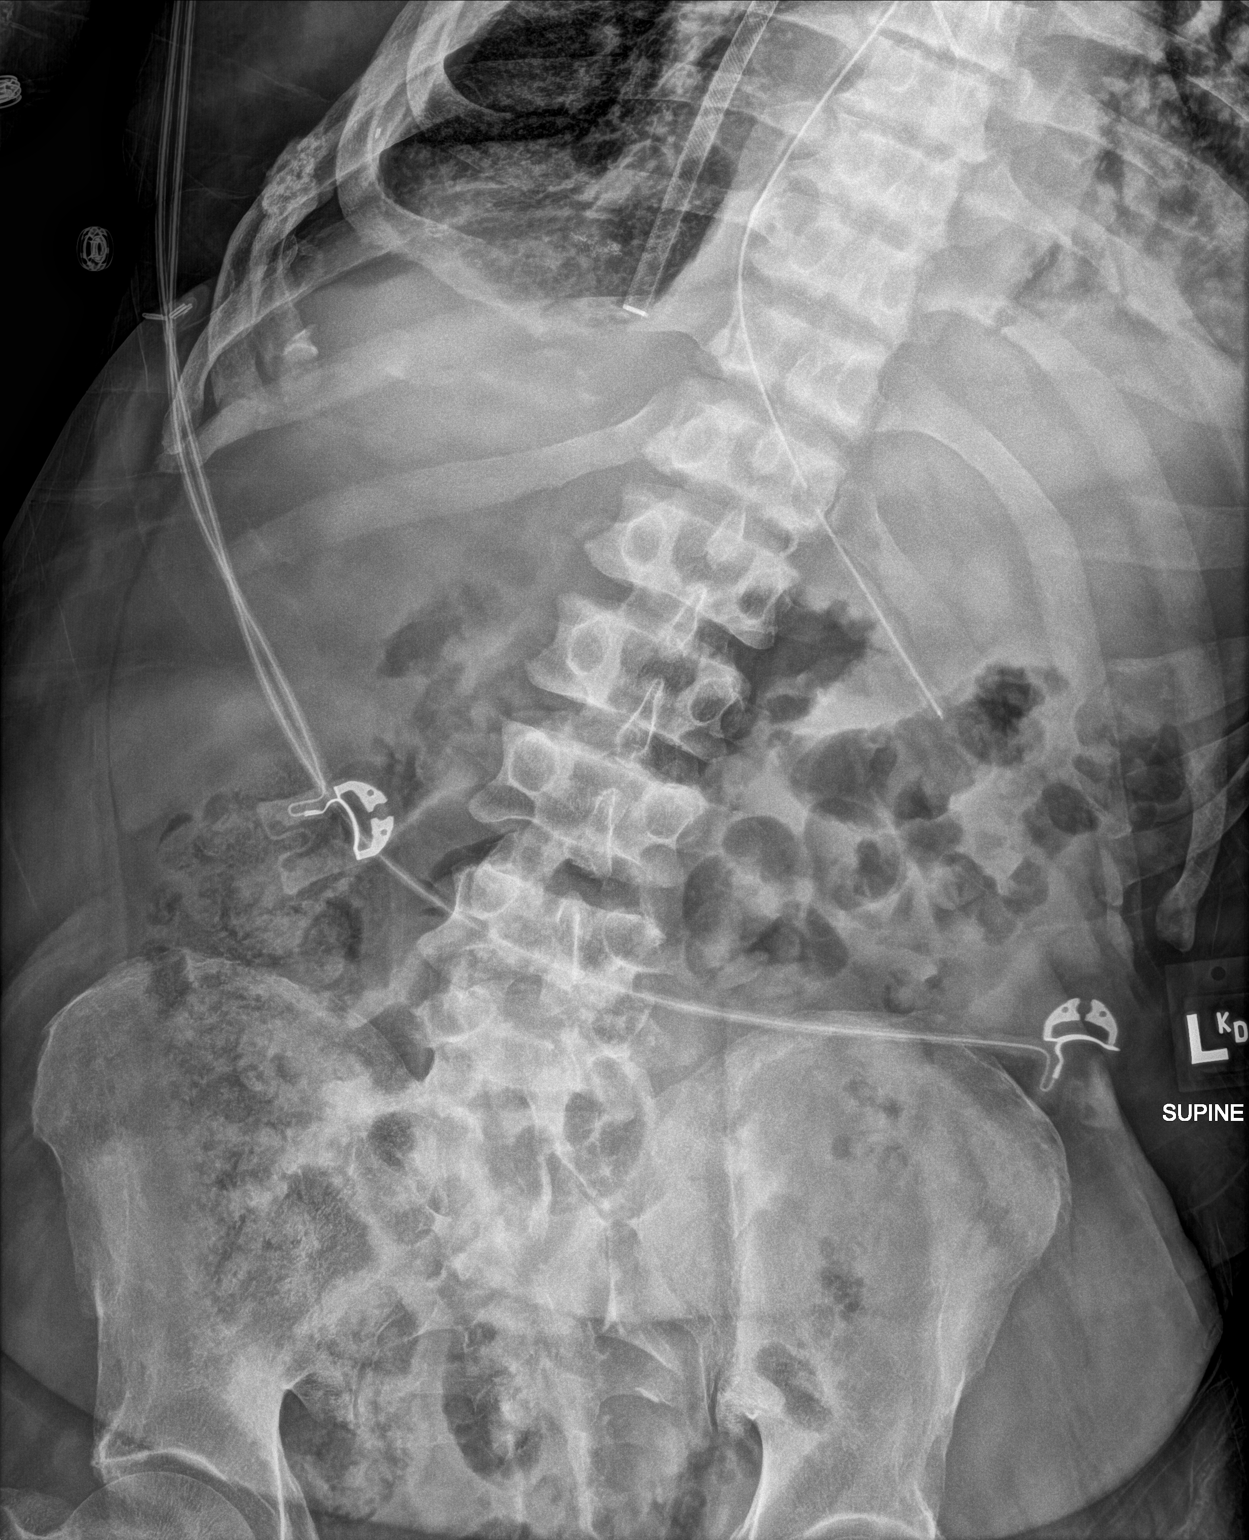

[1 of 1 positions shown; findings below may reference images not displayed]

FINDINGS: Enteric tube in the stomach. Partially visualized HERO graft. The
bowel gas pattern is normal. No radio-opaque calculi or other
significant radiographic abnormality are seen.
IMPRESSION: 1. Enteric tube in the stomach.

## 2023-01-08 IMAGING — MR MR MRA HEAD W/O CM
5 series · 37 of 48 positions shown · non-contrast
Comparison: Prior brain MRI from 08/26/2021.

CLINICAL DATA: Follow-up examination for acute stroke.

EXAM:
MRA HEAD WITHOUT CONTRAST
TECHNIQUE: Angiographic images of the Circle of Willis were acquired using MRA
technique without intravenous contrast.

[Series 1: aahead_scout · sagittal · 1.6mm · 1.62mm/px · 18 of 126 slices shown]
[im 1/126]
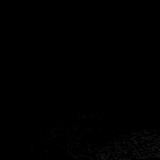
[im 8/126]
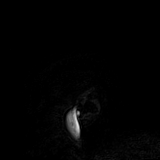
[im 15/126]
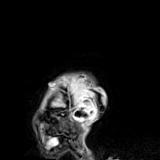
[im 23/126]
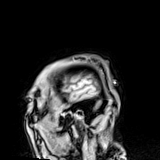
[im 30/126]
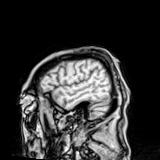
[im 37/126]
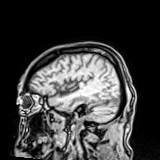
[im 45/126]
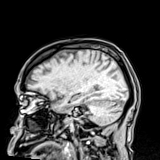
[im 52/126]
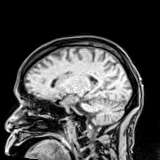
[im 59/126]
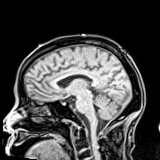
[im 67/126]
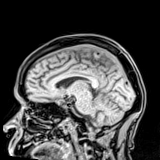
[im 74/126]
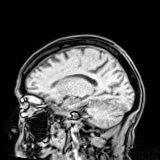
[im 81/126]
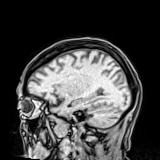
[im 89/126]
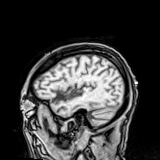
[im 96/126]
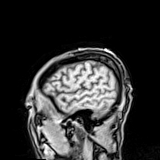
[im 103/126]
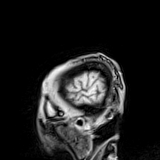
[im 111/126]
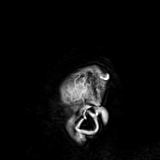
[im 118/126]
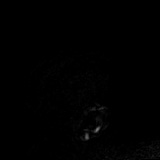
[im 126/126]
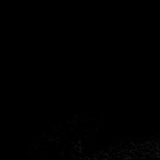

[Series 2: aahead_scout_mpr_sag · sagittal · 1.6mm · 1.60mm/px · 1 of 5 slices shown]
[im 1/5]
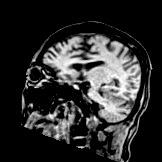

[Series 3: aahead_scout_mpr_cor · coronal · 1.6mm · 1.60mm/px · 1 of 3 slices shown]
[im 1/3]
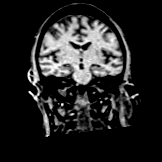

[Series 4: aahead_scout_mpr_tra · axial · 1.6mm · 1.60mm/px · 1 of 3 slices shown]
[im 1/3]
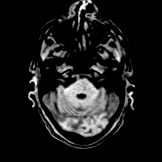

[Series 5: 3d cow · axial · 0.5mm · 0.41mm/px · z∈[-55,+34]mm · 16 of 188 slices shown]
[im 1/188]
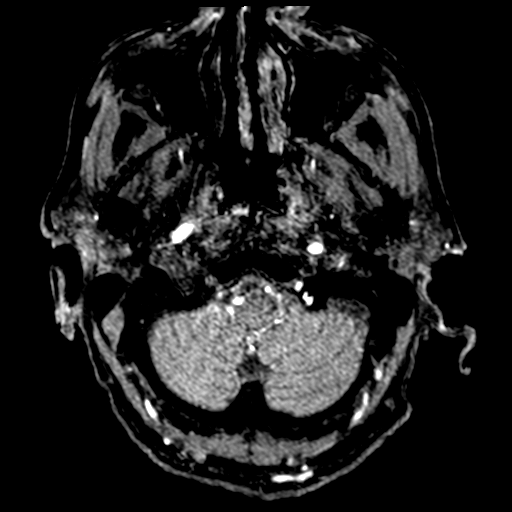
[im 8/188]
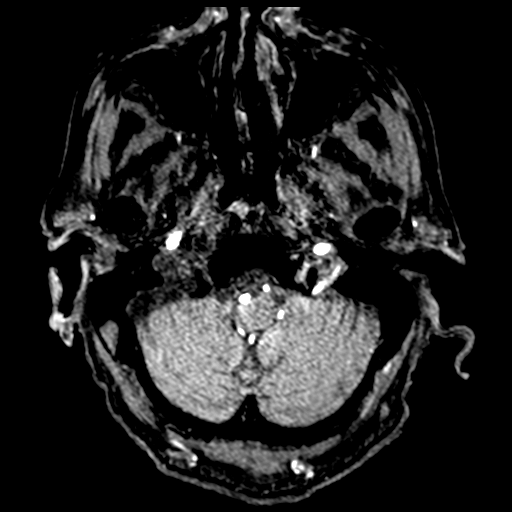
[im 15/188]
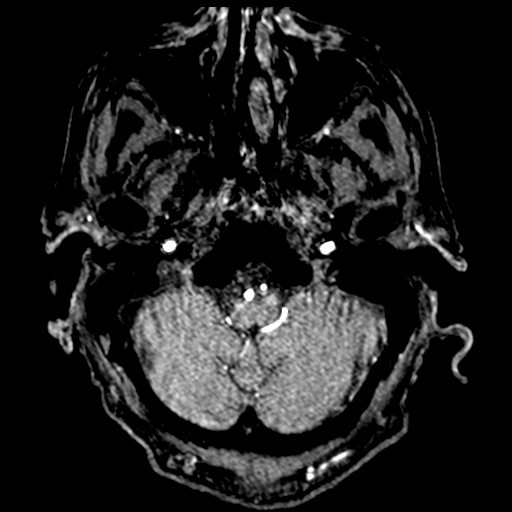
[im 22/188]
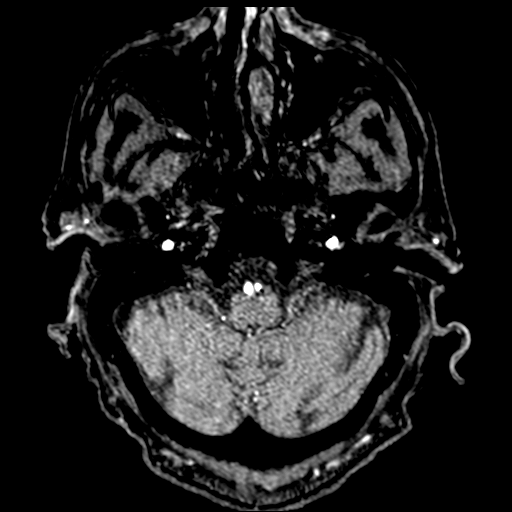
[im 29/188]
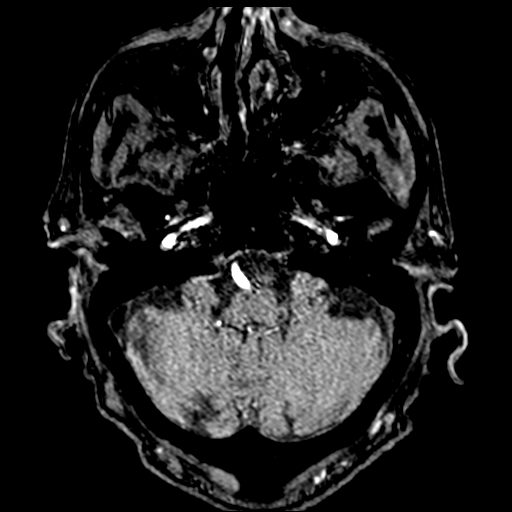
[im 36/188]
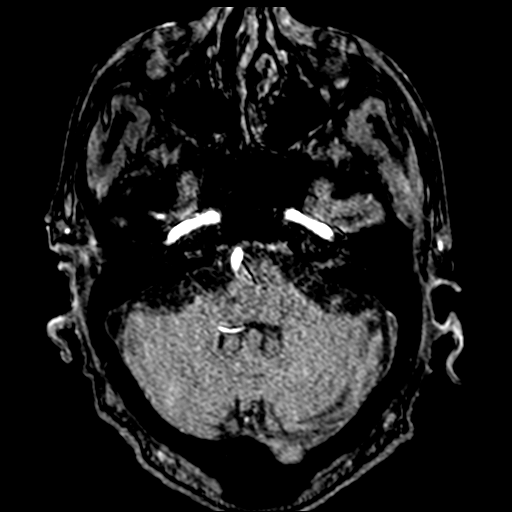
[im 44/188]
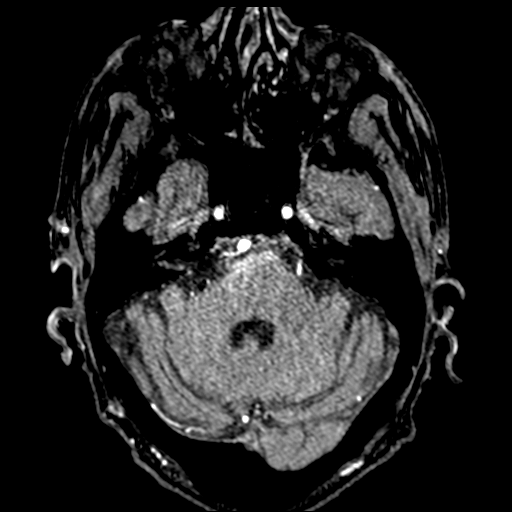
[im 51/188]
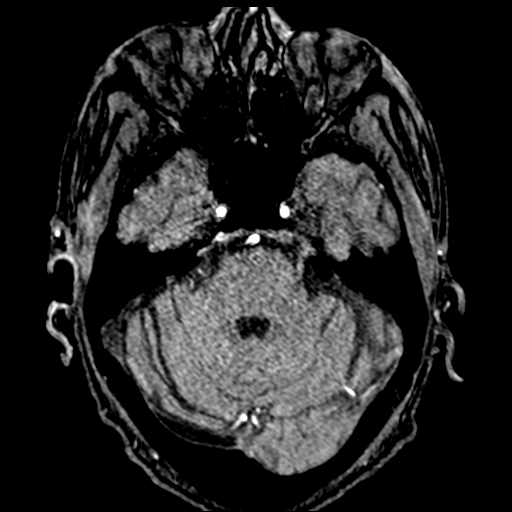
[im 58/188]
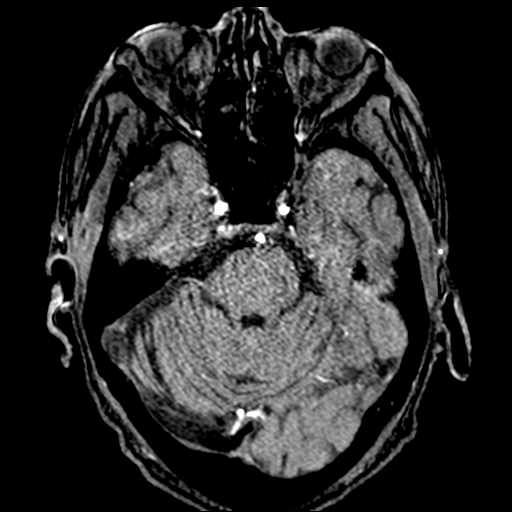
[im 65/188]
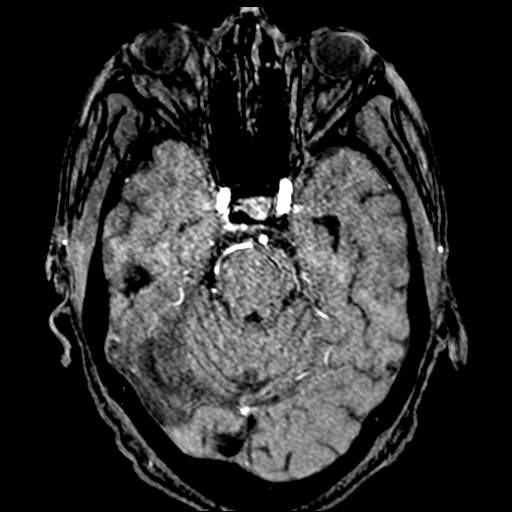
[im 72/188]
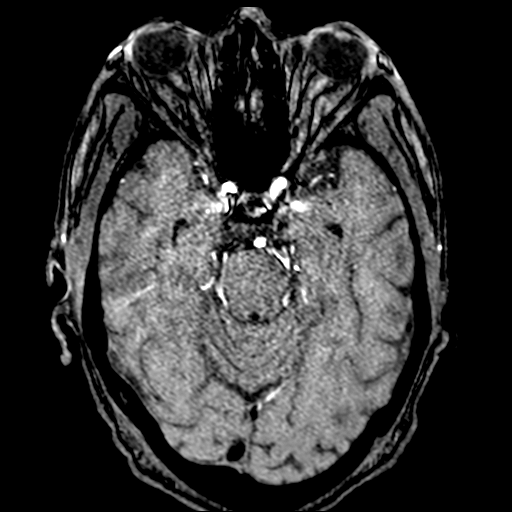
[im 80/188]
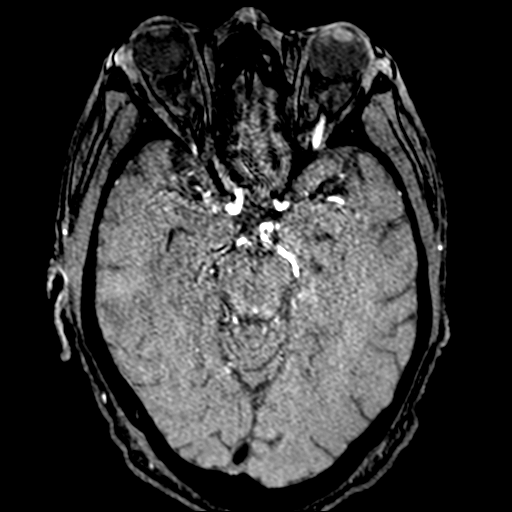
[im 108/188]
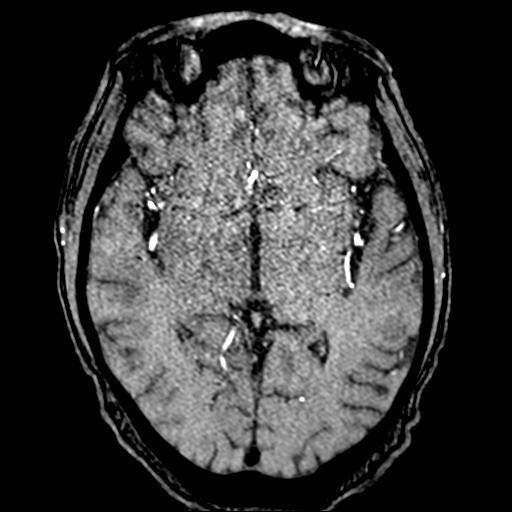
[im 130/188]
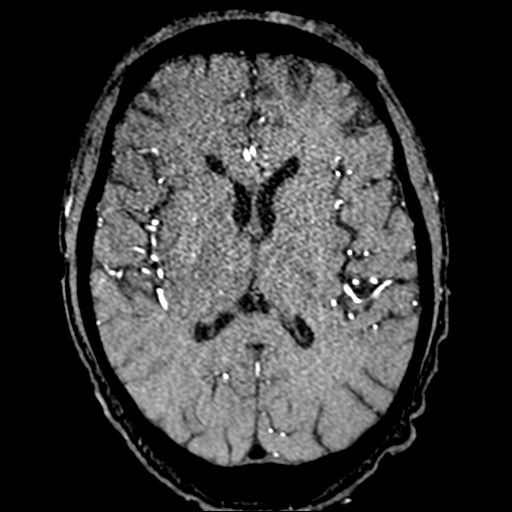
[im 152/188]
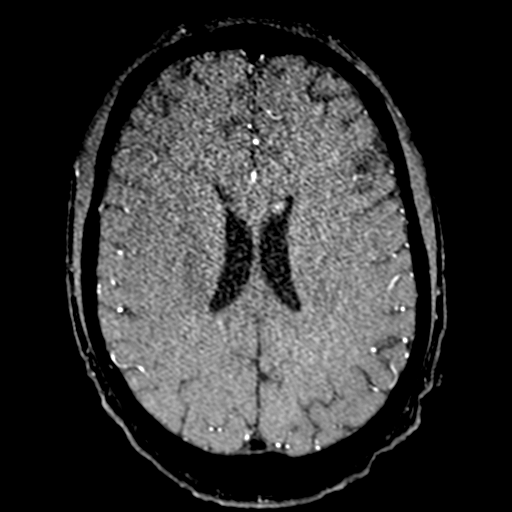
[im 180/188]
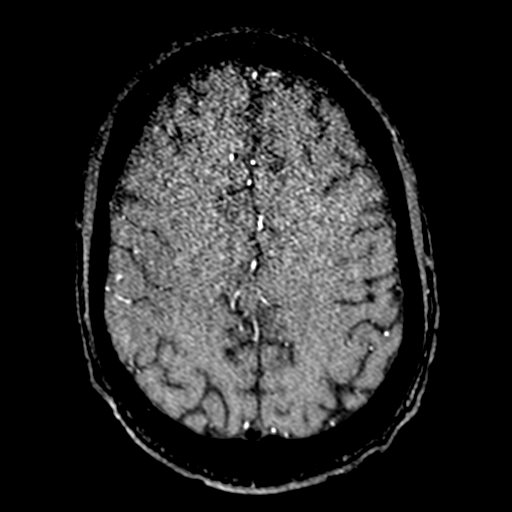

[37 of 48 positions shown; findings below may reference images not displayed]

FINDINGS: Anterior circulation: Examination mildly degraded by motion
artifact.

Visualized distal cervical segments of the internal carotid arteries
are patent with antegrade flow. Petrous, cavernous, and supraclinoid
segments patent without significant stenosis or other abnormality.
A1 segments patent bilaterally. Normal anterior communicating artery
complex. Anterior cerebral arteries patent to their distal aspects
without stenosis. No M1 stenosis or occlusion. Normal MCA
bifurcations. Distal MCA branches well perfused and symmetric.

Posterior circulation: Visualized V4 segments widely patent. Right
vertebral artery dominant. Neither PICA origin visualized. Basilar
patent to its distal aspect without stenosis. Superior cerebral
arteries patent bilaterally. Left P1 segment tortuous but widely
patent. Both PCA supplied via the basilar as well as small bilateral
posterior communicating arteries. PCAs well perfused to their distal
aspects without stenosis.

Anatomic variants: None significant.

Other: No intracranial aneurysm.
IMPRESSION: Negative intracranial MRA. No large vessel occlusion,
hemodynamically significant stenosis, or other acute vascular
abnormality. No aneurysm.

## 2023-01-11 IMAGING — DX DG ABD PORTABLE 1V
1 series · 1 of 1 positions shown · non-contrast
Comparison: 08/31/2021

CLINICAL DATA: NG tube

EXAM:
PORTABLE ABDOMEN - 1 VIEW

[abdomen]
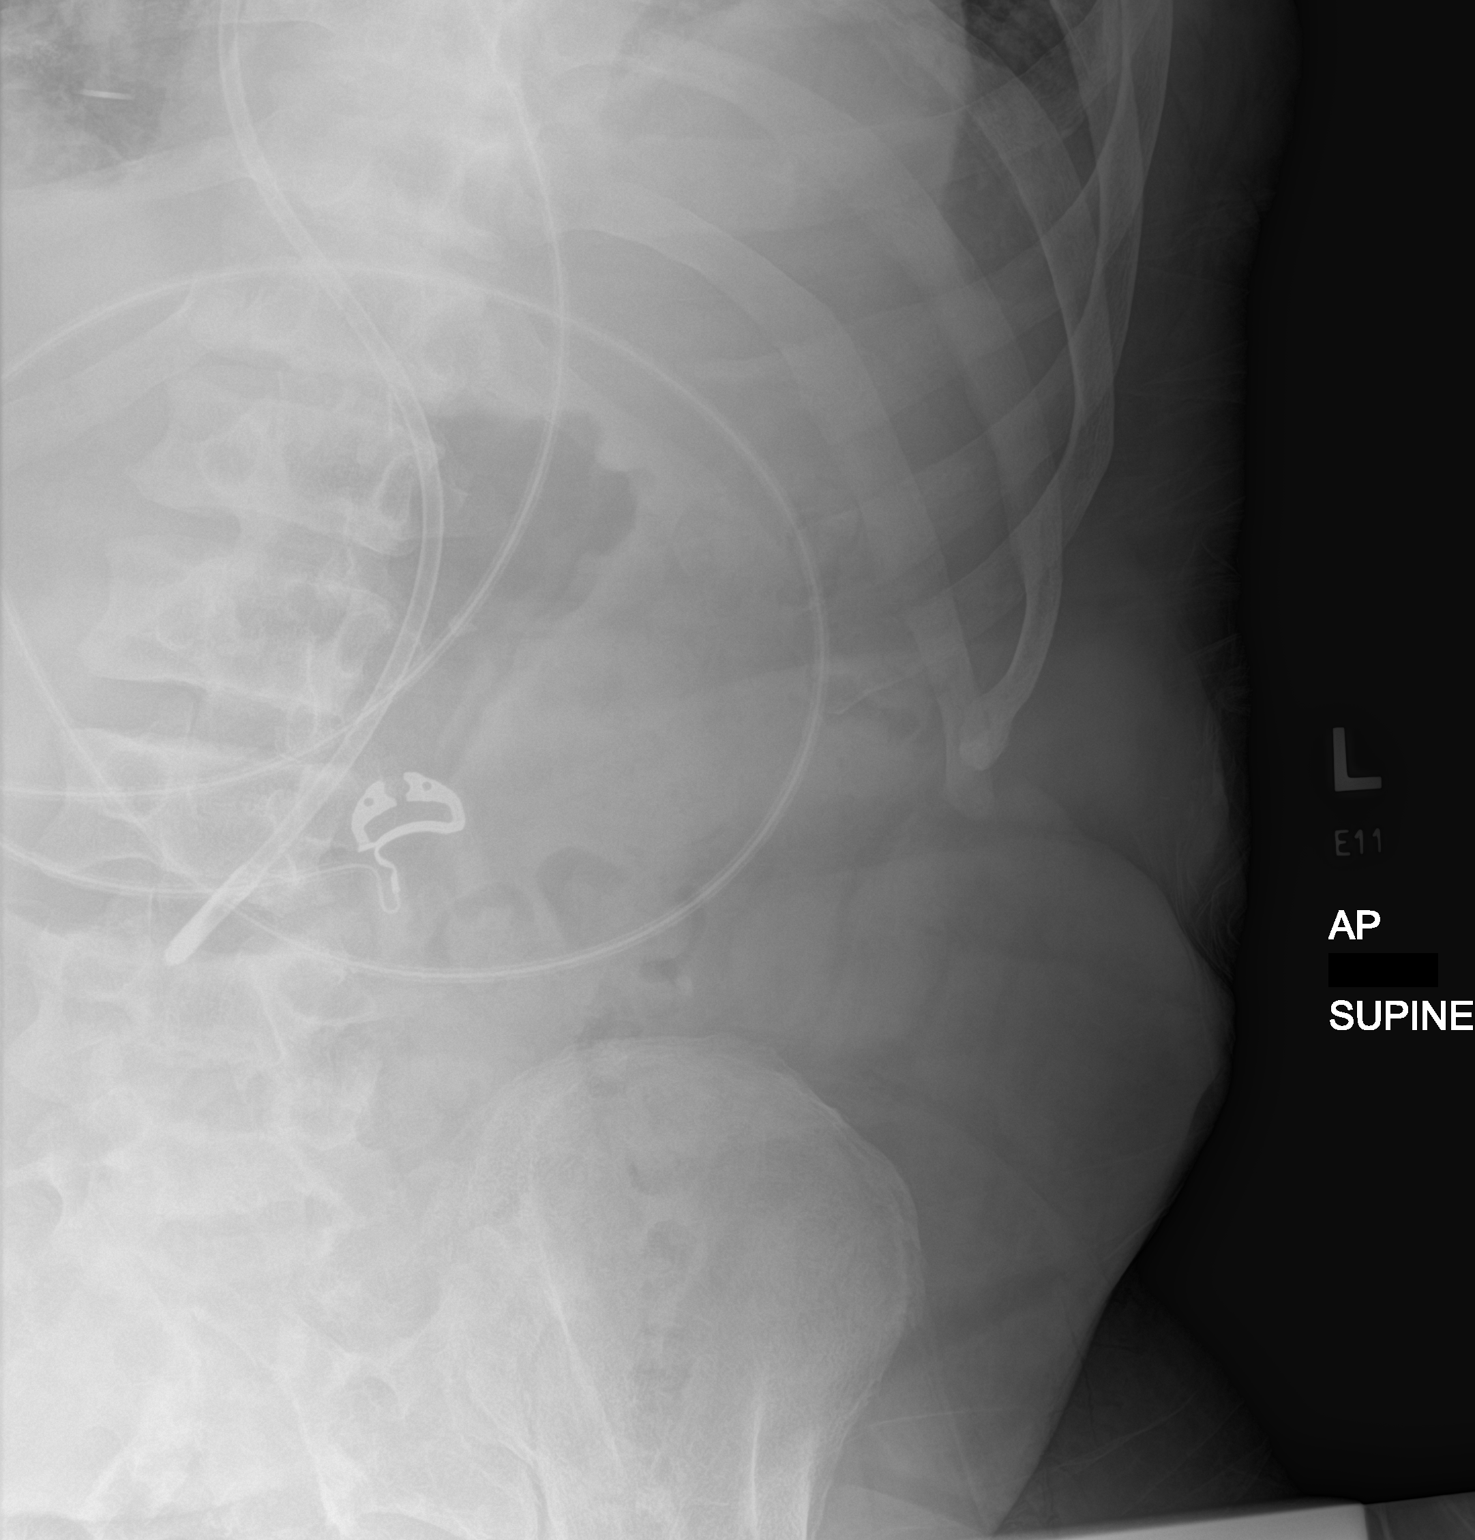

[1 of 1 positions shown; findings below may reference images not displayed]

FINDINGS: Right abdomen incompletely included. Esophageal tube tip overlies
the body of the stomach. Visible gas pattern unremarkable
IMPRESSION: Esophageal tube tip overlies body of the stomach

## 2023-01-12 IMAGING — DX DG CHEST 2V
2 series · 2 of 2 positions shown · non-contrast
Comparison: 08/30/2021

CLINICAL DATA: Aspiration

EXAM:
CHEST - 2 VIEW

[chest lat]
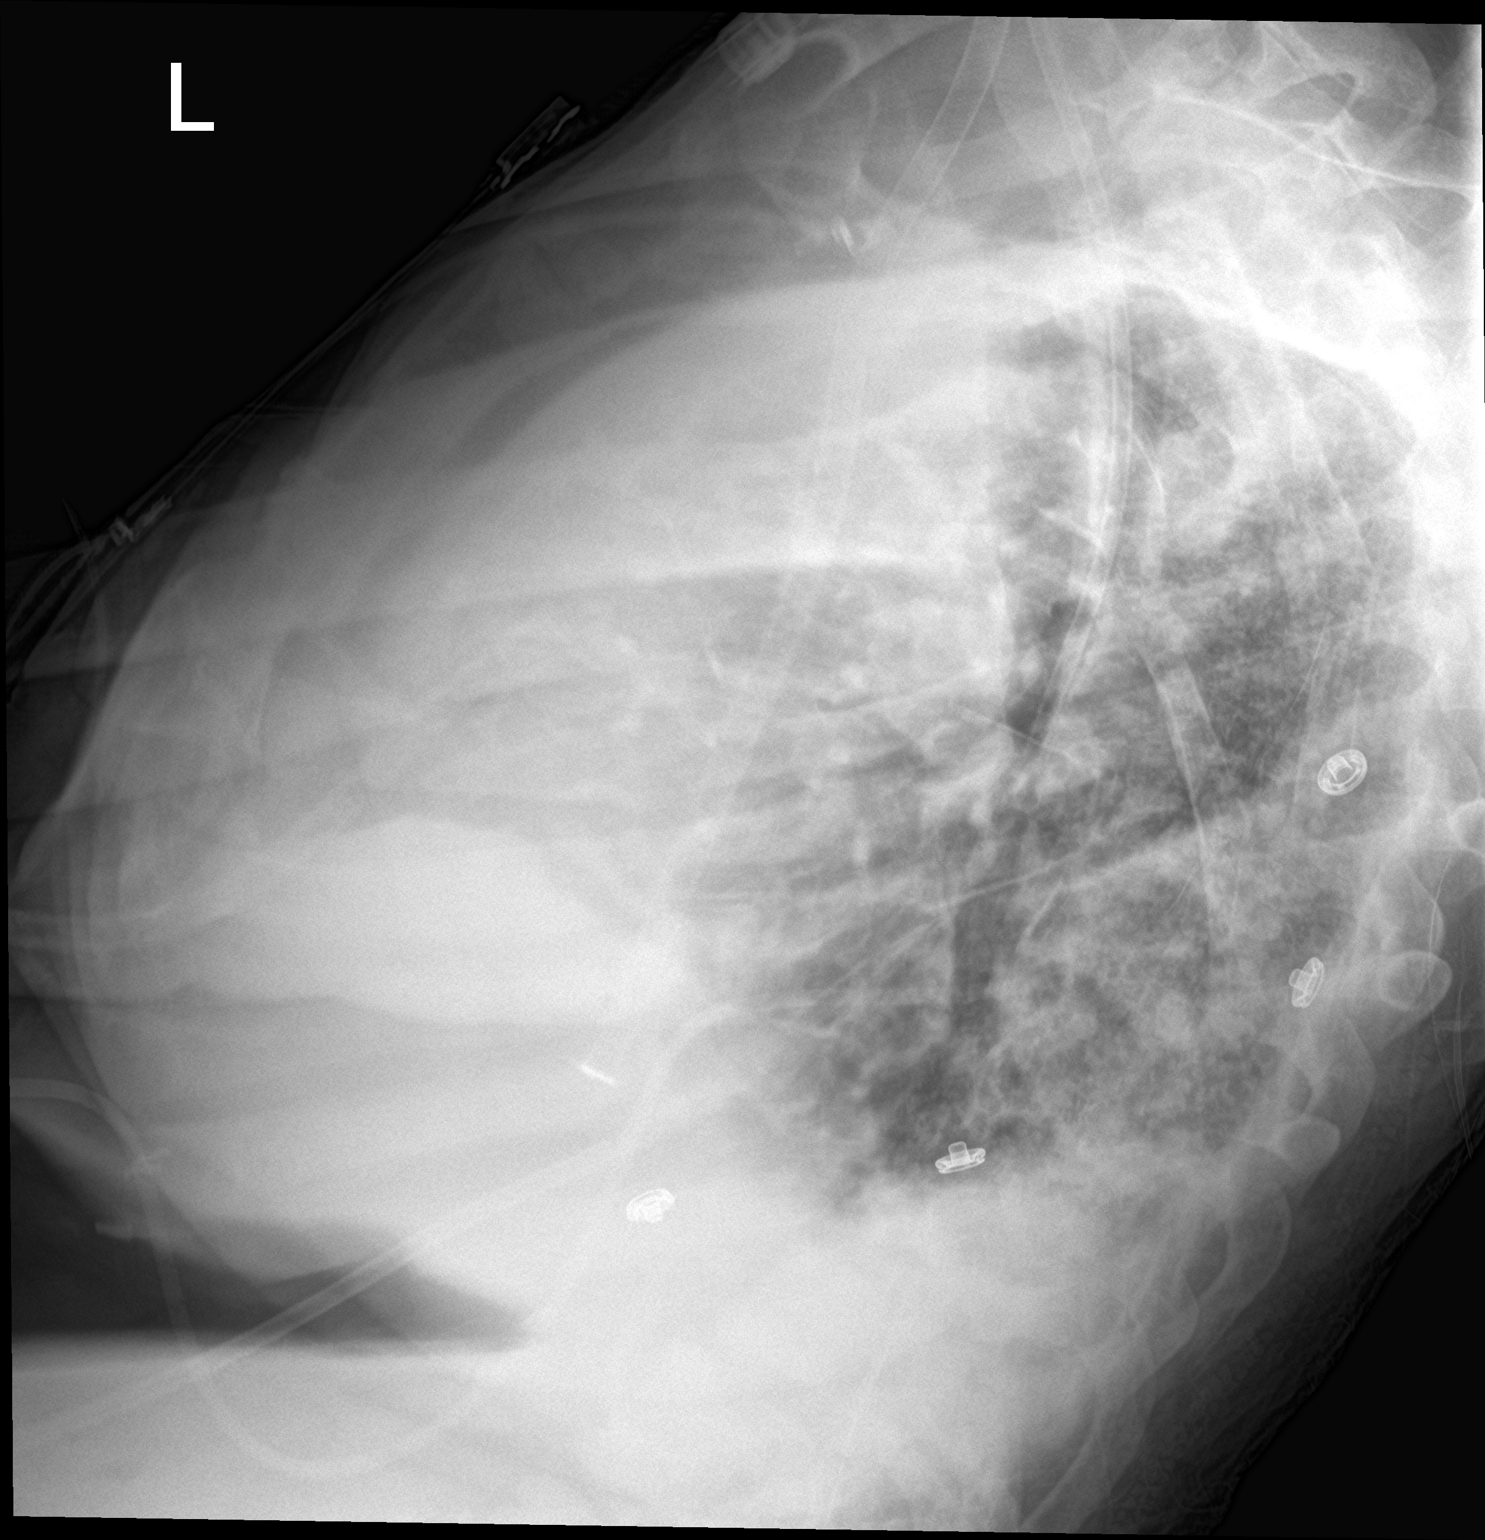

[chest ap]
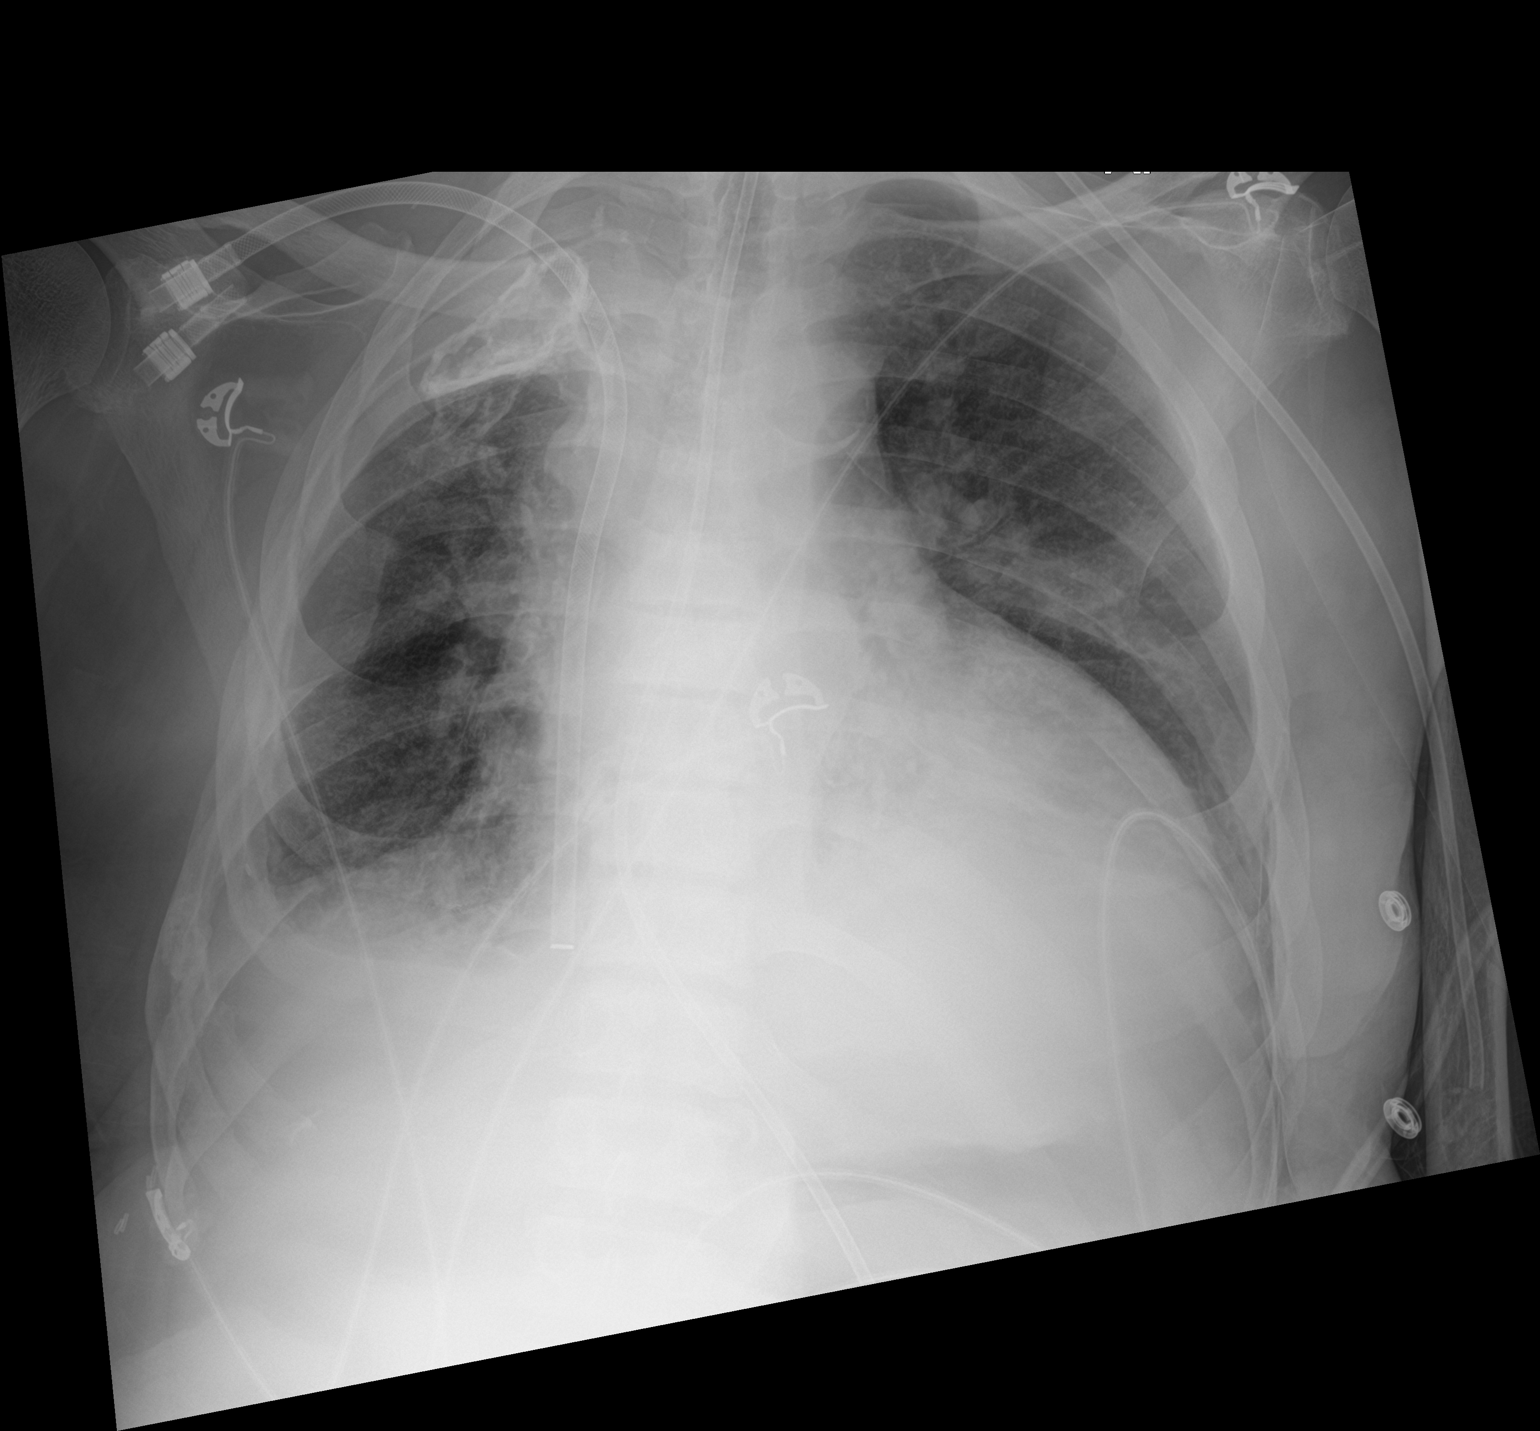

[2 of 2 positions shown; findings below may reference images not displayed]

FINDINGS: Nasoenteric feeding tube extends into the upper abdomen beyond the
margin of the examination. Right internal jugular HERO graft
catheter tip noted within the right atrium. Previously noted left
internal jugular central venous catheter has been removed.

Stable right-sided volume loss with pleuroparenchymal calcification
at the right apex and associated apical pleural thickening. Small
right pleural effusion is unchanged. Bibasilar atelectasis noted.
Cardiac size is mildly enlarged, unchanged. Pulmonary vascularity is
normal.
IMPRESSION: Support lines and tubes as described above.

Stable right-sided volume loss and small right pleural effusion or
pleural thickening.

Pulmonary hypoinflation with bibasilar atelectasis.

Stable cardiomegaly
